# Patient Record
Sex: Male | Born: 1950 | Race: White | Hispanic: No | Smoking: Former smoker
Health system: Southern US, Community
[De-identification: ages and names within clinical notes are randomized; demographics above are authoritative.]

## PROBLEM LIST (undated history)

## (undated) ENCOUNTER — Inpatient Hospital Stay (HOSPITAL_COMMUNITY): Payer: Self-pay | Admitting: Podiatry

## (undated) DIAGNOSIS — I209 Angina pectoris, unspecified: Secondary | ICD-10-CM

## (undated) DIAGNOSIS — R05 Cough: Secondary | ICD-10-CM

## (undated) DIAGNOSIS — M25562 Pain in left knee: Secondary | ICD-10-CM

## (undated) DIAGNOSIS — E039 Hypothyroidism, unspecified: Secondary | ICD-10-CM

## (undated) DIAGNOSIS — R7989 Other specified abnormal findings of blood chemistry: Secondary | ICD-10-CM

## (undated) DIAGNOSIS — G8929 Other chronic pain: Secondary | ICD-10-CM

## (undated) DIAGNOSIS — M549 Dorsalgia, unspecified: Secondary | ICD-10-CM

## (undated) DIAGNOSIS — R9431 Abnormal electrocardiogram [ECG] [EKG]: Secondary | ICD-10-CM

## (undated) DIAGNOSIS — B029 Zoster without complications: Secondary | ICD-10-CM

## (undated) DIAGNOSIS — I10 Essential (primary) hypertension: Secondary | ICD-10-CM

## (undated) DIAGNOSIS — G2581 Restless legs syndrome: Secondary | ICD-10-CM

## (undated) DIAGNOSIS — R072 Precordial pain: Secondary | ICD-10-CM

## (undated) DIAGNOSIS — E875 Hyperkalemia: Secondary | ICD-10-CM

## (undated) DIAGNOSIS — M1990 Unspecified osteoarthritis, unspecified site: Secondary | ICD-10-CM

## (undated) DIAGNOSIS — J8489 Other specified interstitial pulmonary diseases: Secondary | ICD-10-CM

## (undated) DIAGNOSIS — M170 Bilateral primary osteoarthritis of knee: Secondary | ICD-10-CM

## (undated) DIAGNOSIS — Z7901 Long term (current) use of anticoagulants: Secondary | ICD-10-CM

## (undated) DIAGNOSIS — I219 Acute myocardial infarction, unspecified: Secondary | ICD-10-CM

## (undated) DIAGNOSIS — G629 Polyneuropathy, unspecified: Secondary | ICD-10-CM

## (undated) DIAGNOSIS — I5032 Chronic diastolic (congestive) heart failure: Secondary | ICD-10-CM

## (undated) DIAGNOSIS — Z8719 Personal history of other diseases of the digestive system: Secondary | ICD-10-CM

## (undated) DIAGNOSIS — Z0389 Encounter for observation for other suspected diseases and conditions ruled out: Secondary | ICD-10-CM

## (undated) DIAGNOSIS — I313 Pericardial effusion (noninflammatory): Secondary | ICD-10-CM

## (undated) DIAGNOSIS — I509 Heart failure, unspecified: Secondary | ICD-10-CM

## (undated) DIAGNOSIS — M797 Fibromyalgia: Secondary | ICD-10-CM

## (undated) DIAGNOSIS — Z794 Long term (current) use of insulin: Secondary | ICD-10-CM

## (undated) DIAGNOSIS — I110 Hypertensive heart disease with heart failure: Secondary | ICD-10-CM

## (undated) DIAGNOSIS — J449 Chronic obstructive pulmonary disease, unspecified: Secondary | ICD-10-CM

## (undated) DIAGNOSIS — I2510 Atherosclerotic heart disease of native coronary artery without angina pectoris: Secondary | ICD-10-CM

## (undated) DIAGNOSIS — Z8673 Personal history of transient ischemic attack (TIA), and cerebral infarction without residual deficits: Secondary | ICD-10-CM

## (undated) DIAGNOSIS — I499 Cardiac arrhythmia, unspecified: Secondary | ICD-10-CM

## (undated) DIAGNOSIS — I3139 Other pericardial effusion (noninflammatory): Secondary | ICD-10-CM

## (undated) DIAGNOSIS — I4891 Unspecified atrial fibrillation: Secondary | ICD-10-CM

## (undated) DIAGNOSIS — J189 Pneumonia, unspecified organism: Secondary | ICD-10-CM

## (undated) DIAGNOSIS — I739 Peripheral vascular disease, unspecified: Secondary | ICD-10-CM

## (undated) DIAGNOSIS — E1165 Type 2 diabetes mellitus with hyperglycemia: Secondary | ICD-10-CM

## (undated) DIAGNOSIS — I482 Chronic atrial fibrillation, unspecified: Secondary | ICD-10-CM

## (undated) DIAGNOSIS — E1142 Type 2 diabetes mellitus with diabetic polyneuropathy: Secondary | ICD-10-CM

## (undated) DIAGNOSIS — R188 Other ascites: Secondary | ICD-10-CM

## (undated) DIAGNOSIS — M25561 Pain in right knee: Secondary | ICD-10-CM

## (undated) DIAGNOSIS — J9610 Chronic respiratory failure, unspecified whether with hypoxia or hypercapnia: Secondary | ICD-10-CM

## (undated) DIAGNOSIS — R059 Cough, unspecified: Secondary | ICD-10-CM

## (undated) DIAGNOSIS — G4733 Obstructive sleep apnea (adult) (pediatric): Secondary | ICD-10-CM

## (undated) DIAGNOSIS — J45909 Unspecified asthma, uncomplicated: Secondary | ICD-10-CM

## (undated) DIAGNOSIS — E11621 Type 2 diabetes mellitus with foot ulcer: Secondary | ICD-10-CM

## (undated) DIAGNOSIS — G4730 Sleep apnea, unspecified: Secondary | ICD-10-CM

## (undated) DIAGNOSIS — I639 Cerebral infarction, unspecified: Secondary | ICD-10-CM

## (undated) DIAGNOSIS — K219 Gastro-esophageal reflux disease without esophagitis: Secondary | ICD-10-CM

## (undated) DIAGNOSIS — J42 Unspecified chronic bronchitis: Secondary | ICD-10-CM

## (undated) DIAGNOSIS — E119 Type 2 diabetes mellitus without complications: Secondary | ICD-10-CM

## (undated) DIAGNOSIS — L97514 Non-pressure chronic ulcer of other part of right foot with necrosis of bone: Secondary | ICD-10-CM

## (undated) DIAGNOSIS — R042 Hemoptysis: Secondary | ICD-10-CM

## (undated) DIAGNOSIS — Z9884 Bariatric surgery status: Secondary | ICD-10-CM

## (undated) DIAGNOSIS — Z01810 Encounter for preprocedural cardiovascular examination: Secondary | ICD-10-CM

## (undated) DIAGNOSIS — E669 Obesity, unspecified: Secondary | ICD-10-CM

## (undated) DIAGNOSIS — Z9989 Dependence on other enabling machines and devices: Secondary | ICD-10-CM

## (undated) DIAGNOSIS — E785 Hyperlipidemia, unspecified: Secondary | ICD-10-CM

## (undated) DIAGNOSIS — Z9981 Dependence on supplemental oxygen: Secondary | ICD-10-CM

## (undated) DIAGNOSIS — N289 Disorder of kidney and ureter, unspecified: Secondary | ICD-10-CM

## (undated) DIAGNOSIS — G709 Myoneural disorder, unspecified: Secondary | ICD-10-CM

## (undated) HISTORY — DX: Dorsalgia, unspecified: M54.9

## (undated) HISTORY — DX: Pain in right knee: M25.561

## (undated) HISTORY — DX: Chronic atrial fibrillation, unspecified: I48.20

## (undated) HISTORY — PX: UMBILICAL HERNIA REPAIR: SHX196

## (undated) HISTORY — DX: Encounter for preprocedural cardiovascular examination: Z01.810

## (undated) HISTORY — DX: Long term (current) use of insulin: Z79.4

## (undated) HISTORY — PX: HERNIA REPAIR: SHX51

## (undated) HISTORY — DX: Bariatric surgery status: Z98.84

## (undated) HISTORY — DX: Other chronic pain: G89.29

## (undated) HISTORY — DX: Hyperkalemia: E87.5

## (undated) HISTORY — DX: Precordial pain: R07.2

## (undated) HISTORY — DX: Chronic diastolic (congestive) heart failure: I50.32

## (undated) HISTORY — DX: Bilateral primary osteoarthritis of knee: M17.0

## (undated) HISTORY — DX: Hemoptysis: R04.2

## (undated) HISTORY — DX: Hyperlipidemia, unspecified: E78.5

## (undated) HISTORY — DX: Chronic obstructive pulmonary disease, unspecified: J44.9

## (undated) HISTORY — DX: Type 2 diabetes mellitus with foot ulcer: L97.514

## (undated) HISTORY — DX: Zoster without complications: B02.9

## (undated) HISTORY — DX: Disorder of kidney and ureter, unspecified: N28.9

## (undated) HISTORY — DX: Type 2 diabetes mellitus with foot ulcer: E11.621

## (undated) HISTORY — DX: Atherosclerotic heart disease of native coronary artery without angina pectoris: I25.10

## (undated) HISTORY — DX: Cerebral infarction, unspecified: I63.9

## (undated) HISTORY — DX: Type 2 diabetes mellitus with diabetic polyneuropathy: E11.42

## (undated) HISTORY — DX: Pain in left knee: M25.562

## (undated) HISTORY — DX: Abnormal electrocardiogram (ECG) (EKG): R94.31

## (undated) HISTORY — PX: CHOLECYSTECTOMY: SHX55

## (undated) HISTORY — DX: Other ascites: R18.8

## (undated) HISTORY — DX: Chronic respiratory failure, unspecified whether with hypoxia or hypercapnia: J96.10

## (undated) HISTORY — PX: CATARACT EXTRACTION W/ INTRAOCULAR LENS  IMPLANT, BILATERAL: SHX1307

## (undated) HISTORY — DX: Hypertensive heart disease with heart failure: I11.0

## (undated) HISTORY — PX: SINUS EXPLORATION: SHX5214

## (undated) HISTORY — DX: Long term (current) use of anticoagulants: Z79.01

## (undated) HISTORY — DX: Encounter for observation for other suspected diseases and conditions ruled out: Z03.89

## (undated) HISTORY — DX: Pneumonia, unspecified organism: J18.9

## (undated) HISTORY — PX: APPENDECTOMY: SHX54

## (undated) HISTORY — DX: Other specified abnormal findings of blood chemistry: R79.89

## (undated) HISTORY — DX: Other specified interstitial pulmonary diseases: J84.89

## (undated) HISTORY — DX: Obesity, unspecified: E66.9

## (undated) HISTORY — DX: Personal history of transient ischemic attack (TIA), and cerebral infarction without residual deficits: Z86.73

## (undated) HISTORY — PX: CARDIAC CATHETERIZATION: SHX172

## (undated) HISTORY — DX: Unspecified chronic bronchitis: J42

## (undated) HISTORY — DX: Polyneuropathy, unspecified: G62.9

## (undated) HISTORY — DX: Type 2 diabetes mellitus with hyperglycemia: E11.65

---

## 2001-05-16 ENCOUNTER — Ambulatory Visit (HOSPITAL_BASED_OUTPATIENT_CLINIC_OR_DEPARTMENT_OTHER): Admission: RE | Admit: 2001-05-16 | Discharge: 2001-05-16 | Payer: Self-pay | Admitting: Family Medicine

## 2007-05-10 ENCOUNTER — Ambulatory Visit: Payer: Self-pay

## 2007-07-27 ENCOUNTER — Ambulatory Visit: Payer: Self-pay | Admitting: Internal Medicine

## 2007-12-31 ENCOUNTER — Ambulatory Visit: Payer: Self-pay | Admitting: Internal Medicine

## 2008-09-01 HISTORY — PX: LAPAROSCOPIC GASTRIC BANDING: SHX1100

## 2008-10-26 ENCOUNTER — Ambulatory Visit (HOSPITAL_COMMUNITY): Admission: RE | Admit: 2008-10-26 | Discharge: 2008-10-26 | Payer: Self-pay | Admitting: General Surgery

## 2008-11-16 ENCOUNTER — Encounter: Admission: RE | Admit: 2008-11-16 | Discharge: 2008-11-16 | Payer: Self-pay | Admitting: General Surgery

## 2009-05-31 ENCOUNTER — Encounter: Admission: RE | Admit: 2009-05-31 | Discharge: 2009-08-29 | Payer: Self-pay | Admitting: General Surgery

## 2009-06-11 ENCOUNTER — Other Ambulatory Visit: Payer: Self-pay | Admitting: General Surgery

## 2009-06-19 ENCOUNTER — Other Ambulatory Visit: Payer: Self-pay | Admitting: General Surgery

## 2009-06-19 ENCOUNTER — Inpatient Hospital Stay (HOSPITAL_COMMUNITY): Admission: RE | Admit: 2009-06-19 | Discharge: 2009-06-20 | Payer: Self-pay | Admitting: General Surgery

## 2009-09-25 ENCOUNTER — Ambulatory Visit (HOSPITAL_COMMUNITY): Admission: RE | Admit: 2009-09-25 | Discharge: 2009-09-25 | Payer: Self-pay | Admitting: Nephrology

## 2009-10-03 ENCOUNTER — Encounter: Admission: RE | Admit: 2009-10-03 | Discharge: 2009-10-03 | Payer: Self-pay | Admitting: General Surgery

## 2010-01-01 ENCOUNTER — Encounter: Admission: RE | Admit: 2010-01-01 | Discharge: 2010-01-01 | Payer: Self-pay | Admitting: General Surgery

## 2010-06-06 ENCOUNTER — Encounter
Admission: RE | Admit: 2010-06-06 | Discharge: 2010-06-06 | Payer: Self-pay | Source: Home / Self Care | Attending: General Surgery | Admitting: General Surgery

## 2010-09-01 DIAGNOSIS — I639 Cerebral infarction, unspecified: Secondary | ICD-10-CM | POA: Insufficient documentation

## 2010-09-01 HISTORY — DX: Cerebral infarction, unspecified: I63.9

## 2010-10-24 ENCOUNTER — Other Ambulatory Visit: Payer: Self-pay | Admitting: General Surgery

## 2010-10-28 ENCOUNTER — Ambulatory Visit
Admission: RE | Admit: 2010-10-28 | Discharge: 2010-10-28 | Disposition: A | Discharge status: Home / Self Care | Payer: PRIVATE HEALTH INSURANCE | Source: Ambulatory Visit | Attending: General Surgery | Admitting: General Surgery

## 2010-12-05 LAB — GLUCOSE, CAPILLARY
Glucose-Capillary: 128 mg/dL — ABNORMAL HIGH (ref 70–99)
Glucose-Capillary: 130 mg/dL — ABNORMAL HIGH (ref 70–99)
Glucose-Capillary: 136 mg/dL — ABNORMAL HIGH (ref 70–99)
Glucose-Capillary: 170 mg/dL — ABNORMAL HIGH (ref 70–99)
Glucose-Capillary: 176 mg/dL — ABNORMAL HIGH (ref 70–99)
Glucose-Capillary: 179 mg/dL — ABNORMAL HIGH (ref 70–99)
Glucose-Capillary: 187 mg/dL — ABNORMAL HIGH (ref 70–99)

## 2010-12-05 LAB — CBC
HCT: 37.7 % — ABNORMAL LOW (ref 39.0–52.0)
Hemoglobin: 12.6 g/dL — ABNORMAL LOW (ref 13.0–17.0)
MCV: 83.8 fL (ref 78.0–100.0)
MCV: 82.4 fL (ref 78.0–100.0)
RBC: 4.3 MIL/uL (ref 4.22–5.81)
RBC: 4.58 MIL/uL (ref 4.22–5.81)
RDW: 16.4 % — ABNORMAL HIGH (ref 11.5–15.5)
WBC: 7.6 10*3/uL (ref 4.0–10.5)

## 2010-12-05 LAB — COMPREHENSIVE METABOLIC PANEL
Alkaline Phosphatase: 77 U/L (ref 39–117)
CO2: 33 mEq/L — ABNORMAL HIGH (ref 19–32)
Calcium: 9.3 mg/dL (ref 8.4–10.5)
Chloride: 95 mEq/L — ABNORMAL LOW (ref 96–112)
Creatinine, Ser: 1.49 mg/dL (ref 0.4–1.5)
GFR calc Af Amer: 58 mL/min — ABNORMAL LOW (ref >60)
GFR calc non Af Amer: 48 mL/min — ABNORMAL LOW (ref >60)
Potassium: 3.4 mEq/L — ABNORMAL LOW (ref 3.5–5.1)

## 2010-12-05 LAB — HEMOGLOBIN AND HEMATOCRIT, BLOOD
HCT: 34.4 % — ABNORMAL LOW (ref 39.0–52.0)
Hemoglobin: 11.3 g/dL — ABNORMAL LOW (ref 13.0–17.0)

## 2010-12-05 LAB — DIFFERENTIAL
Basophils Absolute: 0 10*3/uL (ref 0.0–0.1)
Basophils Absolute: 0 10*3/uL (ref 0.0–0.1)
Basophils Relative: 0 % (ref 0–1)
Basophils Relative: 0 % (ref 0–1)
Eosinophils Absolute: 0 10*3/uL (ref 0.0–0.7)
Lymphocytes Relative: 18 % (ref 12–46)
Lymphocytes Relative: 21 % (ref 12–46)
Lymphs Abs: 1.2 10*3/uL (ref 0.7–4.0)
Lymphs Abs: 1.6 10*3/uL (ref 0.7–4.0)
Monocytes Absolute: 0.5 10*3/uL (ref 0.1–1.0)
Neutro Abs: 4.9 10*3/uL (ref 1.7–7.7)
Neutrophils Relative %: 74 % (ref 43–77)

## 2011-01-10 ENCOUNTER — Institutional Professional Consult (permissible substitution): Payer: PRIVATE HEALTH INSURANCE | Admitting: Pulmonary Disease

## 2011-01-14 NOTE — Assessment & Plan Note (Signed)
[facility]                            CARDIOLOGY OFFICE NOTE   NAME:Jon [name], Jon [name]                      MRN:          [id number]  DATE:12/31/2007                            DOB:          1951/08/30    PRIMARY CARE PHYSICIAN:  Dr. Shayne Alken at [facility]  in [place].   INTERVAL HISTORY:  Jon [name] is a 60 year old male with multiple  medical problems including hypertension, morbid obesity, hyperlipidemia,  diabetes, COPD, obstructive sleep apnea on CPAP, paroxysmal atrial  fibrillation presents today for follow-up.   He has had an extensive cardiac workup in the past including several  catheterizations most recently in August 2007 which showed a normal LV  function and normal coronary arteries.   He continues to have episodes of chest pain which he rate 6-7/10.  He  has been seen multiple times in the [place] ER and told these are  likely due to indigestion or his blood pressure.  He is getting quite  frustrated with this.  He has been doing his best to try and lose weight  he says.  He has been trying to watch what he eats and walking twice a  day for half mile to three-quarters of a mile.  Says he can do this, but  he also has to stop to rest due to shortness of breath.  He has not been  successful losing much weight.   CURRENT MEDICATIONS:  1. Lyrica 300 b.i.d.  2. Lipitor 20 a day.  3. Requip.  4. Advair 500/50 b.i.d.  5. Astelin nasal spray.  6. Nasacort.  7. Torsemide 20 t.i.d.  8. Coumadin.  9. Diltiazem 120 a day.  10.Potassium 20 t.i.d.  11.Insulin.  12.Pantoprazole 40 a day.   PHYSICAL EXAMINATION:  GENERAL:  He is in no acute distress.  He  ambulates around the clinic without respiratory difficulty.  Blood  pressure is 124/50, heart rate 80, weight is 280 which is up 4 pounds  from previous.  HEENT:  Normal.  NECK:  Supple and thick, unable to see his JVD.  Carotids have 2+  bilateral bruits.  There is  no lymphadenopathy or thyromegaly.  CARDIAC:  PMI is nonpalpable, distant heart sounds.  He is regular with  no murmurs.  LUNGS:  Clear with no wheezing.  ABDOMEN:  Marked central obesity.  Unable to appreciate any  hepatosplenomegaly.  There are no bruits, no appreciable masses.  Good  bowel sounds.  EXTREMITIES:  Warm with no cyanosis, clubbing or edema.  No rash.  NEUROLOGICAL:  Alert and oriented x3.  Cranial nerves II-XII are intact.  Moves all fours without difficulty.  Affect is pleasant.   STUDIES:  EKG shows normal sinus rhythm with a chronic left bundle  branch block at a rate of 80.   ASSESSMENT/PLAN:  1. Chest pain.  Given his normal catheterization just less than 2      years ago, I think his chest pain is probably not cardiac and more      related to his obesity and related dyspnea.  2. Obesity.  I think this is the central part of his problem.  He has      been unsuccessful with diet and any attempts to lose weight.  I      will refer him to Dr. Kaylyn Lim [facility] for      consideration of possible bariatric surgery.  3. Hypertension, this is well-controlled.  4. Hyperlipidemia.  This is followed by Dr. Tobie Poet.  Given his diabetes,      goal LDL is less than 70.   DISPOSITION:  Return to clinic in one year for follow-up.     Shaune Pascal. Bensimhon, MD  Electronically Signed    DRB/MedQ  DD: 12/31/2007  DT: 12/31/2007  Job #: [id number]   cc:   Isabel Caprice Hassell Done, MD

## 2011-01-14 NOTE — Assessment & Plan Note (Signed)
[facility]                            CARDIOLOGY OFFICE NOTE   NAME:[name], Jon [name]                      MRN:          [id number]  DATE:07/27/2007                            DOB:          [birth year]    REFERRING PHYSICIAN:  Dr. Chalmers Cater, [facility]   REASON FOR CONSULTATION:  Chest pain and shortness of breath.   HISTORY OF PRESENT ILLNESS:  Jon [name] is a 60 year old male with  multiple medical problems including obesity, hypertension,  hyperlipidemia, diabetes, COPD, obstructive sleep apnea on C-PAP, and  paroxysmal atrial fibrillation, who presents today for further  evaluation of chest pain.   He tells me that his chest pain actually dates more than 15 years.  He  had a cardiac catheterization by Dr. Ellouise Newer in 1994 which showed  normal coronary arteries and normal LV function.  More recently, he has  been seen by Dr. Jenne Campus at [facility].  They have  done a very thorough workup including an echocardiogram showing an  ejection fraction of 50-55% with significant diastolic dysfunction and  mild mitral and tricuspid regurgitation.  He also has a cardiac  catheterization which showed normal coronary arteries and an LVEDP of  18.  CT scan of the chest revealed no evidence of pulmonary embolus.   He presents asking for a second opinion.  He felt that Dr. Agustin Cree was  inappropriate as he often would give him metaphors about his heart  disease like talking about a car and he was uncomfortable with this.   He says that he has been told to exercise and diet.  He has dieted in  the past and lost 40-50 pounds but he has put these back on.  He says  his wife continues to feed him aggressively and he has a hard time  saying no.  He has tried to do some exercise but this is limited by his  neuropathy and leg pain.  He denies any orthopnea, no PND.  He does  sleep with C-PAP.  No palpitations or syncope.   REVIEW OF  SYSTEMS:  Notable for gastroesophageal reflux disease,  diabetes, neuropathy, constipation, shortness of breath, allergies and  peptic ulcer disease.  The remainder of the review of systems is  negative except for HPI and problem list.   PROBLEM LIST:  1. Chronic chest pain and dyspnea.      a.     Cardiac catheterizations x2 with normal coronary arteries,       most recently in August 2007.      b.     Echocardiogram, ejection fraction [id number] with diastolic       dysfunction.      c.     A CT scan of the chest negative for pulmonary embolus.  2. Paroxysmal atrial fibrillation.  3. Morbid obesity.  4. Hypertension.  5. Hyperlipidemia.  6. COPD with previous cigar smoke.  7. Obstructive sleep apnea on C-PAP.  8. Peripheral neuropathy.   CURRENT MEDICATIONS:  1. Insulin  2. Lyrica 300 b.i.d.  3. Lipitor 20 a day.  4.  Omeprazole 20 a day.  5. Requip  6. Mobic 15 a day.  7. Advair 500/50 b.i.d.  8. Astelin nasal spray.  9. Nasacort  10.Norvasc 5 a day.  11.Torsemide 20 b.i.d.  12.Benicar HCTZ 40/12.5  13.Coumadin  14.Diltiazem 120 mg a day.  15.Potassium 20 mEq t.i.d.   ALLERGIES:  PENICILLIN and CODEINE.   SOCIAL HISTORY:  He is married with one child.  He is here with his  daughter today.  He is retired.  He has a history of heavy cigar smoking  but quit in 2007.  Also has a history of heavy alcohol use but now does  not drink anymore.   FAMILY HISTORY:  Mother died at 36 due to breast cancer.  Father died at  55 due to stroke.  He has 2 sisters.  There is no family history of  premature coronary artery disease.   PHYSICAL EXAMINATION:  He is a morbidly obese male, ambulates around the  clinic slowly but no respiratory difficulty.  Blood pressure is 118/58,  heart rate 74, weight is 276.  HEENT:  Normal.  NECK:  Thick, supple, no obvious JVD.  Carotids are 2+ bilaterally with  no bruits.  There is no lymphadenopathy or thyromegaly.  CARDIAC:  PMI is nondisplaced.   He has distant heart sounds.  He is  regular, no obvious murmurs, rubs or gallops.  LUNGS:  Clear, with decreased air movement throughout, no wheezing.  ABDOMEN:  Morbidly obese, good bowel sounds and soft.  Unable to  evaluate for hepatosplenomegaly, no bruits, no masses appreciated.  EXTREMITIES:  Warm with no cyanosis, clubbing.  There is trace edema  bilaterally.  Distal pulses are 1+ bilaterally.  NEURO:  Alert and oriented x3.  Cranial nerves II-XII are intact.  Moves  all 4 extremities without difficulty.  Affect is pleasant.   EKG shows normal sinus rhythm with a nonspecific intraventricular  conduction delay.  Rate is 67.  There is also a first degree AV block at  206 Msec.   ASSESSMENT AND PLAN:  1. Chest pain and shortness of breath.  These are both chronic.  I had      a long talk with him and his daughter and told him that he has had      a very thorough workup and I do not think that his chest pain is      ischemic in nature.  I think that almost all of his symptoms can be      attributed to his marked obesity and deconditioning, as well as his      COPD.  We did discuss the possibility of a cardiopulmonary exercise      test.  I told him the most important thing is to get serious about      his diet and exercise plan and do his best to be somewhat more      active and watch his diet very closely.  I set a goal for him to      lose 1/2 pound to 1 pound a week.  I will see him back in 4 months      and told him I expect him to be 15 pounds lighter.  I would not      recommend any further testing at this point.   Of note, would mention that he is on 2 calcium channel blockers,  Diltiazem and Norvasc.  Given his history of paroxysmal atrial  fibrillation, he may do best  with just switching his Norvasc over to  increasing his Diltiazem dose.  I will leave this to Dr. Tobie Poet.   DISPOSITION:  I will see him back in several months for a routine  followup.     Shaune Pascal.  Bensimhon, MD  Electronically Signed    DRB/MedQ  DD: 07/27/2007  DT: 07/27/2007  Job #: [id number]   cc:   Patricia Pesa, fax: [phone] Dr. Chalmers Cater, [facility]

## 2011-12-25 ENCOUNTER — Other Ambulatory Visit: Payer: Self-pay | Admitting: Orthopedic Surgery

## 2011-12-26 ENCOUNTER — Encounter (HOSPITAL_COMMUNITY)
Admission: RE | Admit: 2011-12-26 | Discharge: 2011-12-26 | Disposition: A | Discharge status: Home / Self Care | Payer: Medicare Other | Source: Ambulatory Visit | Attending: Orthopedic Surgery | Admitting: Orthopedic Surgery

## 2011-12-26 ENCOUNTER — Encounter (HOSPITAL_COMMUNITY): Payer: Self-pay | Admitting: Vascular Surgery

## 2011-12-26 ENCOUNTER — Encounter (HOSPITAL_COMMUNITY): Payer: Self-pay

## 2011-12-26 DIAGNOSIS — Z01818 Encounter for other preprocedural examination: Secondary | ICD-10-CM | POA: Insufficient documentation

## 2011-12-26 HISTORY — DX: Myoneural disorder, unspecified: G70.9

## 2011-12-26 HISTORY — DX: Essential (primary) hypertension: I10

## 2011-12-26 HISTORY — DX: Hypothyroidism, unspecified: E03.9

## 2011-12-26 HISTORY — DX: Hyperlipidemia, unspecified: E78.5

## 2011-12-26 HISTORY — DX: Cardiac arrhythmia, unspecified: I49.9

## 2011-12-26 HISTORY — DX: Angina pectoris, unspecified: I20.9

## 2011-12-26 HISTORY — DX: Cerebral infarction, unspecified: I63.9

## 2011-12-26 HISTORY — DX: Unspecified osteoarthritis, unspecified site: M19.90

## 2011-12-26 HISTORY — DX: Pneumonia, unspecified organism: J18.9

## 2011-12-26 HISTORY — DX: Sleep apnea, unspecified: G47.30

## 2011-12-26 HISTORY — DX: Restless legs syndrome: G25.81

## 2011-12-26 HISTORY — DX: Gastro-esophageal reflux disease without esophagitis: K21.9

## 2011-12-26 HISTORY — DX: Personal history of other diseases of the digestive system: Z87.19

## 2011-12-26 LAB — URINE MICROSCOPIC-ADD ON

## 2011-12-26 LAB — CBC
Hemoglobin: 14.9 g/dL (ref 13.0–17.0)
MCH: 30.8 pg (ref 26.0–34.0)
MCV: 85.1 fL (ref 78.0–100.0)
RBC: 4.84 MIL/uL (ref 4.22–5.81)
WBC: 13.4 10*3/uL — ABNORMAL HIGH (ref 4.0–10.5)

## 2011-12-26 LAB — DIFFERENTIAL
Basophils Absolute: 0 10*3/uL (ref 0.0–0.1)
Basophils Relative: 0 % (ref 0–1)
Eosinophils Relative: 0 % (ref 0–5)
Lymphocytes Relative: 10 % — ABNORMAL LOW (ref 12–46)
Monocytes Absolute: 0.7 10*3/uL (ref 0.1–1.0)

## 2011-12-26 LAB — COMPREHENSIVE METABOLIC PANEL
ALT: 15 U/L (ref 0–53)
AST: 12 U/L (ref 0–37)
CO2: 24 mEq/L (ref 19–32)
Calcium: 10 mg/dL (ref 8.4–10.5)
Chloride: 99 mEq/L (ref 96–112)
Creatinine, Ser: 0.89 mg/dL (ref 0.50–1.35)
GFR calc Af Amer: >90 mL/min (ref >90)
GFR calc non Af Amer: 89 mL/min — ABNORMAL LOW (ref >90)
Glucose, Bld: 241 mg/dL — ABNORMAL HIGH (ref 70–99)
Total Bilirubin: 1.1 mg/dL (ref 0.3–1.2)

## 2011-12-26 LAB — URINALYSIS, ROUTINE W REFLEX MICROSCOPIC
Bilirubin Urine: NEGATIVE
Glucose, UA: >1000 mg/dL — AB
Hgb urine dipstick: NEGATIVE
Ketones, ur: NEGATIVE mg/dL
Protein, ur: NEGATIVE mg/dL
Urobilinogen, UA: 0.2 mg/dL (ref 0.0–1.0)

## 2011-12-26 LAB — TYPE AND SCREEN
ABO/RH(D): A POS
Antibody Screen: NEGATIVE

## 2011-12-26 LAB — ABO/RH: ABO/RH(D): A POS

## 2011-12-26 LAB — PROTIME-INR: Prothrombin Time: 17.1 seconds — ABNORMAL HIGH (ref 11.6–15.2)

## 2011-12-26 NOTE — Progress Notes (Addendum)
Medical physican - Dr. Dirk Dress - [place]   Patient has insulin pump on right side  Patient does not have a cardiologist.    Patient had cardiac cath, stress test, echo in 2007 - records requested  Patient had chest xray April 1st at [facility] - records requested

## 2011-12-26 NOTE — Consult Note (Signed)
Anesthesia Chart Review:  Patient is a 61 year old male scheduled for a right TKA on 01/05/12.  History includes DM with an insulin pump, peripheral neuropathy, HLD, GERD, CVA, HTN, hypothyroidism, OSA, RLS, HH, arthritis.  PCP is Dr. Dirk Dress in [place].  He has previously been seen by a Cardiologist at [facility], but is not being followed currently.    EKG today shows SB with a left BBB.  It has not significantly changed since 10/26/08.    He reports a previous echo, stress, and cath.  Records have been requested, but are still pending.  His CXR from [facility] done on 12/16/11 showed no active cardiopulmonary abnormalities.  Labs noted.  Glucose is 241.  Cr 0.89.  WBC 13.4.  H/H 14.9/41.2.  PT is slightly elevated at 17.1.  INR and PTT are WNL.  Urine is negative for leukocytes and nitrites. Positive glucose.  Urine culture is pending.  I'll follow-up once I receive his Cardiac records/tests.  Keri at Dr. Ruel Favors office is going to fax over his medical clearance as well.

## 2011-12-26 NOTE — Pre-Procedure Instructions (Addendum)
Jon [name]  12/26/2011   Your procedure is scheduled on:  May 6,2013 @ 1000  Report to [facility] at 0800 AM.  Call this number if you have problems the morning of surgery: [phone]   Remember:   Do not eat food:After Midnight.  May have clear liquids: up to 4 Hours before arrival.  Clear liquids include soda, tea, black coffee, apple or grape juice, broth.  Take these medicines the morning of surgery with A SIP OF WATER: Cardizem, duoneb, advair, protonix, lyrica, nasal spray   Do not wear jewelry  Do not wear lotions, powders, or perfumes.   Do not bring valuables to the hospital.  Contacts, dentures or bridgework may not be worn into surgery.  Leave suitcase in the car. After surgery it may be brought to your room.  For patients admitted to the hospital, checkout time is 11:00 AM the day of discharge.   Patients discharged the day of surgery will not be allowed to drive home.  Special Instructions: CHG Shower Use Special Wash: 1/2 bottle night before surgery and 1/2 bottle morning of surgery.   Please read over the following fact sheets that you were given: Pain Booklet, Coughing and Deep Breathing, Blood Transfusion Information, MRSA Information and Surgical Site Infection Prevention

## 2011-12-27 LAB — URINE CULTURE
Colony Count: NO GROWTH
Culture: NO GROWTH

## 2012-01-05 ENCOUNTER — Inpatient Hospital Stay (HOSPITAL_COMMUNITY): Admission: RE | Admit: 2012-01-05 | Payer: Medicare Other | Source: Ambulatory Visit | Admitting: Orthopedic Surgery

## 2012-01-05 ENCOUNTER — Encounter (HOSPITAL_COMMUNITY): Admission: RE | Payer: Self-pay | Source: Ambulatory Visit

## 2012-01-05 SURGERY — ARTHROPLASTY, KNEE, TOTAL
Anesthesia: Regional | Laterality: Right

## 2012-01-08 ENCOUNTER — Encounter (INDEPENDENT_AMBULATORY_CARE_PROVIDER_SITE_OTHER): Payer: Self-pay | Admitting: General Surgery

## 2013-10-17 DIAGNOSIS — M25519 Pain in unspecified shoulder: Secondary | ICD-10-CM | POA: Diagnosis not present

## 2013-10-17 DIAGNOSIS — E1149 Type 2 diabetes mellitus with other diabetic neurological complication: Secondary | ICD-10-CM | POA: Diagnosis not present

## 2013-10-17 DIAGNOSIS — I4891 Unspecified atrial fibrillation: Secondary | ICD-10-CM | POA: Diagnosis not present

## 2013-10-17 DIAGNOSIS — E782 Mixed hyperlipidemia: Secondary | ICD-10-CM | POA: Diagnosis not present

## 2013-10-17 DIAGNOSIS — M1710 Unilateral primary osteoarthritis, unspecified knee: Secondary | ICD-10-CM | POA: Diagnosis not present

## 2013-10-17 DIAGNOSIS — G4733 Obstructive sleep apnea (adult) (pediatric): Secondary | ICD-10-CM | POA: Diagnosis not present

## 2013-10-17 DIAGNOSIS — Z125 Encounter for screening for malignant neoplasm of prostate: Secondary | ICD-10-CM | POA: Diagnosis not present

## 2013-11-03 ENCOUNTER — Ambulatory Visit (INDEPENDENT_AMBULATORY_CARE_PROVIDER_SITE_OTHER): Payer: Medicare Other | Admitting: Physician Assistant

## 2013-11-03 ENCOUNTER — Encounter (INDEPENDENT_AMBULATORY_CARE_PROVIDER_SITE_OTHER): Payer: Self-pay | Admitting: Physician Assistant

## 2013-11-03 VITALS — BP 138/82 | HR 84 | Temp 98.2°F | Resp 20 | Ht 67.0 in | Wt 266.0 lb

## 2013-11-03 DIAGNOSIS — Z9884 Bariatric surgery status: Secondary | ICD-10-CM | POA: Insufficient documentation

## 2013-11-03 DIAGNOSIS — Z4651 Encounter for fitting and adjustment of gastric lap band: Secondary | ICD-10-CM | POA: Diagnosis not present

## 2013-11-03 HISTORY — DX: Bariatric surgery status: Z98.84

## 2013-11-03 NOTE — Progress Notes (Signed)
  HISTORY: Jon [name] is a 63 y.o.male who received an AP-Large lap-band in October 2010 by Dr. Excell Seltzer. He comes in having last been seen in February 2012. He has lost 5 lbs since then, with a total of 24 lbs since surgery. He denies regurgitation or reflux symptoms. He has a slight increase in hunger and portions. His level of physical activity remains relatively low. He wants to get back on track with weight loss.  VITAL SIGNS: Filed Vitals:   11/03/13 1019  BP: 138/82  Pulse: 84  Temp: 98.2 F (36.8 C)  Resp: 20    PHYSICAL EXAM: Physical exam reveals a very well-appearing 63 y.o.male in no apparent distress Neurologic: Awake, alert, oriented Psych: Bright affect, conversant Respiratory: Breathing even and unlabored. No stridor or wheezing Abdomen: Soft, nontender, nondistended to palpation. Incisions well-healed. No incisional hernias. Port easily palpated. Extremities: Atraumatic, good range of motion.  ASSESMENT: 63 y.o.  male  s/p AP-Large lap-band.   PLAN: The patient's port was accessed with a 20G Huber needle without difficulty. Clear fluid was aspirated and 0.5 mL saline was added to the port to give a total predicted volume of 9 mL. The patient was able to swallow water without difficulty following the procedure and was instructed to take clear liquids for the next 24-48 hours and advance slowly as tolerated. I asked him to follow-up with Nutrition. We're happy to give him a referral as he prefers to see a dietician in [place]. We will have him return in six weeks or sooner if needed.

## 2013-11-03 NOTE — Patient Instructions (Signed)

## 2013-11-08 ENCOUNTER — Other Ambulatory Visit (INDEPENDENT_AMBULATORY_CARE_PROVIDER_SITE_OTHER): Payer: Self-pay

## 2013-11-09 ENCOUNTER — Telehealth (INDEPENDENT_AMBULATORY_CARE_PROVIDER_SITE_OTHER): Payer: Self-pay | Admitting: *Deleted

## 2013-11-09 NOTE — Telephone Encounter (Signed)
LMOM for pt to return my call.  I was calling to notify him of the appt for his to see Gates Rigg, Registered Dietician on 11/18/13 with an arrival time of 2:30pm.  He will be going to [facility] located at [address]. Their phone number is [phone] if pt needs to reschedule.

## 2013-11-14 ENCOUNTER — Encounter (INDEPENDENT_AMBULATORY_CARE_PROVIDER_SITE_OTHER): Payer: Self-pay | Admitting: *Deleted

## 2013-11-14 NOTE — Telephone Encounter (Signed)
Mailed letter out to patient with appt information below included.

## 2013-11-18 DIAGNOSIS — E1165 Type 2 diabetes mellitus with hyperglycemia: Secondary | ICD-10-CM | POA: Diagnosis not present

## 2013-11-18 DIAGNOSIS — E669 Obesity, unspecified: Secondary | ICD-10-CM | POA: Diagnosis not present

## 2013-11-18 DIAGNOSIS — IMO0002 Reserved for concepts with insufficient information to code with codable children: Secondary | ICD-10-CM | POA: Diagnosis not present

## 2013-11-22 DIAGNOSIS — Z1211 Encounter for screening for malignant neoplasm of colon: Secondary | ICD-10-CM | POA: Diagnosis not present

## 2013-11-22 DIAGNOSIS — D485 Neoplasm of uncertain behavior of skin: Secondary | ICD-10-CM | POA: Diagnosis not present

## 2013-11-22 DIAGNOSIS — E1149 Type 2 diabetes mellitus with other diabetic neurological complication: Secondary | ICD-10-CM | POA: Diagnosis not present

## 2013-11-22 DIAGNOSIS — Z6839 Body mass index (BMI) 39.0-39.9, adult: Secondary | ICD-10-CM | POA: Diagnosis not present

## 2013-11-22 DIAGNOSIS — Z23 Encounter for immunization: Secondary | ICD-10-CM | POA: Diagnosis not present

## 2013-11-22 DIAGNOSIS — IMO0002 Reserved for concepts with insufficient information to code with codable children: Secondary | ICD-10-CM | POA: Diagnosis not present

## 2013-11-22 DIAGNOSIS — M1710 Unilateral primary osteoarthritis, unspecified knee: Secondary | ICD-10-CM | POA: Diagnosis not present

## 2013-11-22 DIAGNOSIS — Z0000 Encounter for general adult medical examination without abnormal findings: Secondary | ICD-10-CM | POA: Diagnosis not present

## 2013-11-28 DIAGNOSIS — Z1211 Encounter for screening for malignant neoplasm of colon: Secondary | ICD-10-CM | POA: Diagnosis not present

## 2013-11-30 ENCOUNTER — Encounter (INDEPENDENT_AMBULATORY_CARE_PROVIDER_SITE_OTHER): Payer: Self-pay | Admitting: General Surgery

## 2013-11-30 DIAGNOSIS — J209 Acute bronchitis, unspecified: Secondary | ICD-10-CM | POA: Diagnosis not present

## 2013-11-30 DIAGNOSIS — E1149 Type 2 diabetes mellitus with other diabetic neurological complication: Secondary | ICD-10-CM | POA: Diagnosis not present

## 2013-11-30 DIAGNOSIS — R042 Hemoptysis: Secondary | ICD-10-CM | POA: Diagnosis not present

## 2013-12-01 DIAGNOSIS — J40 Bronchitis, not specified as acute or chronic: Secondary | ICD-10-CM | POA: Diagnosis not present

## 2013-12-01 DIAGNOSIS — R059 Cough, unspecified: Secondary | ICD-10-CM | POA: Diagnosis not present

## 2013-12-05 DIAGNOSIS — M1990 Unspecified osteoarthritis, unspecified site: Secondary | ICD-10-CM | POA: Diagnosis not present

## 2013-12-05 DIAGNOSIS — M1710 Unilateral primary osteoarthritis, unspecified knee: Secondary | ICD-10-CM | POA: Diagnosis not present

## 2013-12-12 DIAGNOSIS — M1710 Unilateral primary osteoarthritis, unspecified knee: Secondary | ICD-10-CM | POA: Diagnosis not present

## 2013-12-15 ENCOUNTER — Encounter (INDEPENDENT_AMBULATORY_CARE_PROVIDER_SITE_OTHER): Payer: Medicare Other

## 2013-12-19 DIAGNOSIS — M1710 Unilateral primary osteoarthritis, unspecified knee: Secondary | ICD-10-CM | POA: Diagnosis not present

## 2013-12-19 DIAGNOSIS — M1990 Unspecified osteoarthritis, unspecified site: Secondary | ICD-10-CM | POA: Diagnosis not present

## 2013-12-20 ENCOUNTER — Encounter: Payer: Self-pay | Admitting: Critical Care Medicine

## 2013-12-20 ENCOUNTER — Ambulatory Visit (INDEPENDENT_AMBULATORY_CARE_PROVIDER_SITE_OTHER): Payer: Medicare Other | Admitting: Critical Care Medicine

## 2013-12-20 VITALS — BP 134/70 | HR 70 | Temp 98.9°F | Ht 69.0 in | Wt 263.0 lb

## 2013-12-20 DIAGNOSIS — J441 Chronic obstructive pulmonary disease with (acute) exacerbation: Secondary | ICD-10-CM

## 2013-12-20 DIAGNOSIS — K219 Gastro-esophageal reflux disease without esophagitis: Secondary | ICD-10-CM

## 2013-12-20 DIAGNOSIS — E1149 Type 2 diabetes mellitus with other diabetic neurological complication: Secondary | ICD-10-CM

## 2013-12-20 DIAGNOSIS — E669 Obesity, unspecified: Secondary | ICD-10-CM

## 2013-12-20 DIAGNOSIS — R042 Hemoptysis: Secondary | ICD-10-CM | POA: Diagnosis not present

## 2013-12-20 DIAGNOSIS — G2581 Restless legs syndrome: Secondary | ICD-10-CM

## 2013-12-20 DIAGNOSIS — G4730 Sleep apnea, unspecified: Secondary | ICD-10-CM

## 2013-12-20 MED ORDER — LEVOFLOXACIN 500 MG PO TABS: 500.0000 mg | ORAL_TABLET | Freq: Every day | ORAL | Status: DC

## 2013-12-20 NOTE — Progress Notes (Signed)
Subjective:    Patient ID: Jon [name], male    DOB: 28-Dec-1950, 63 y.o.   MRN: [id number]  HPI Hemoptysis started end of 10/2013.  Pure blood.   Dx bronchitis.  Noted wheezing.  Pt is on xarelto.   Last time coughed blood was one week ago. Has never done this before.   Also: DOE, wheezes. Notes GERD daily. Also sinus pressure Needs R TKR.  Had prior sinus surgery 2x All pcn/codeine  Hx of CHF.  Atrial fib.  [place] is cardiology Hx lap band surgery [facility]  Dx: asthma, DM2 on pump, hyperlipidemia OSA on cpap plus oxygen 2L DME: [employer]  CBGs 140- 160  Cough is better.  Still wheezing. On advair  ex smoker 5 cigars per day for 39yrs  Past Medical History  Diagnosis Date  . Hypothyroidism   . Dysrhythmia     atrial fib takes cardizem,   . Hypertension   . Angina   . Stroke 2012  . Restless leg syndrome   . GERD (gastroesophageal reflux disease)   . Sleep apnea     cpap & oxygen  . Diabetes mellitus     insulin pump   . Pneumonia     hx of  . Hyperlipemia   . H/O hiatal hernia   . Neuromuscular disorder     rls, neuropathy  . Arthritis     right knee  . Obesity (BMI 30-39.9)      Family History  Problem Relation Age of Onset  . Anesthesia problems Neg Hx   . Hypotension Neg Hx   . Malignant hyperthermia Neg Hx   . Pseudochol deficiency Neg Hx   . Cancer Mother      History   Social History  . Marital Status: Married    Spouse Name: N/A    Number of Children: N/A  . Years of Education: N/A   Occupational History  . Not on file.   Social History Main Topics  . Smoking status: Former Smoker -- 4 years    Types: Cigars    Quit date: 09/01/2006  . Smokeless tobacco: Never Used  . Alcohol Use: No     Comment: quit 8 years ago  . Drug Use: Not on file  . Sexual Activity: Not on file   Other Topics Concern  . Not on file   Social History Narrative  . No narrative on file     Allergies  Allergen Reactions  . Codeine Itching  .  Penicillins Rash     Outpatient Prescriptions Prior to Visit  Medication Sig Dispense Refill  . atorvastatin (LIPITOR) 40 MG tablet Take 40 mg by mouth daily.      . Calcium Carbonate-Vitamin D (CALTRATE 600+D PO) Take 2 tablets by mouth 2 (two) times daily.       Marland Kitchen diltiazem (CARDIZEM) 30 MG tablet Take 30 mg by mouth 2 (two) times daily.      . fentaNYL (DURAGESIC - DOSED MCG/HR) 75 MCG/HR Place 1 patch onto the skin every 3 (three) days.      . Fluticasone-Salmeterol (ADVAIR) 500-50 MCG/DOSE AEPB Inhale 1 puff into the lungs every 12 (twelve) hours.      . Insulin Human (INSULIN PUMP) 100 unit/ml SOLN Inject into the skin continuous. novolog      . isosorbide mononitrate (IMDUR) 60 MG 24 hr tablet Take 60 mg by mouth every morning.      Marland Kitchen lisinopril (PRINIVIL,ZESTRIL) 10 MG tablet Take 10 mg by  mouth daily.      . Multiple Vitamins-Minerals (CENTRUM SILVER PO) Take 1 tablet by mouth daily.      Marland Kitchen oxyCODONE-acetaminophen (PERCOCET) 5-325 MG per tablet Take 1 tablet by mouth 4 (four) times daily as needed. For severe pain      . pantoprazole (PROTONIX) 40 MG tablet Take 40 mg by mouth 2 (two) times daily.      . potassium chloride SA (K-DUR,KLOR-CON) 20 MEQ tablet Take 20 mEq by mouth 3 (three) times daily.      . pregabalin (LYRICA) 300 MG capsule Take 300 mg by mouth 2 (two) times daily.      . rivaroxaban (XARELTO) 10 MG TABS tablet Take 20 mg by mouth daily.      Marland Kitchen zolpidem (AMBIEN) 10 MG tablet Take 10 mg by mouth at bedtime.      Marland Kitchen amitriptyline (ELAVIL) 25 MG tablet Take 25 mg by mouth at bedtime.      Marland Kitchen ipratropium-albuterol (DUONEB) 0.5-2.5 (3) MG/3ML SOLN Take 3 mLs by nebulization every 6 (six) hours as needed. For shortness of breath      . lubiprostone (AMITIZA) 24 MCG capsule Take 24 mcg by mouth daily with breakfast.      . magnesium oxide (MAG-OX) 400 MG tablet Take 1,600 mg by mouth daily.       . nitroGLYCERIN (NITROSTAT) 0.4 MG SL tablet Place 0.4 mg under the tongue every  5 (five) minutes as needed. For chest pain      . pramipexole (MIRAPEX) 1.5 MG tablet Take 1.5 mg by mouth at bedtime.      . torsemide (DEMADEX) 20 MG tablet Take 20 mg by mouth 2 (two) times daily.      Marland Kitchen triamcinolone (NASACORT) 55 MCG/ACT nasal inhaler Place 2 sprays into the nose daily.       No facility-administered medications prior to visit.        Review of Systems Constitutional:   No  weight loss, night sweats,  Fevers, chills, fatigue, lassitude. HEENT:   No headaches,  Difficulty swallowing,  Tooth/dental problems,  Sore throat,                No sneezing, itching, ear ache, nasal congestion, post nasal drip,   CV:  No chest pain,  Orthopnea, PND, swelling in lower extremities, anasarca, dizziness, palpitations  GI  No heartburn, indigestion, abdominal pain, nausea, vomiting, diarrhea, change in bowel habits, loss of appetite  Resp: No shortness of breath with exertion or at rest.  No excess mucus, no productive cough,  No non-productive cough,  +++coughing up of blood.  No change in color of mucus.  No wheezing.  No chest wall deformity  Skin: no rash or lesions.  GU: no dysuria, change in color of urine, no urgency or frequency.  No flank pain.  MS:  No joint pain or swelling.  No decreased range of motion.  No back pain.  Psych:  No change in mood or affect. No depression or anxiety.  No memory loss.     Objective:   Physical Exam Filed Vitals:   12/20/13 1348  BP: 134/70  Pulse: 70  Temp: 98.9 F (37.2 C)  TempSrc: Oral  Height: 5\' 9"  (1.753 m)  Weight: 263 lb (119.296 kg)  SpO2: 95%    Gen: Pleasant, obese, in no distress,  normal affect  ENT: No lesions,  mouth clear,  oropharynx clear, no postnasal drip  Neck: No JVD, no TMG, no carotid bruits  Lungs: No use of accessory muscles, no dullness to percussion, distant bs  Cardiovascular: RRR, heart sounds normal, no murmur or gallops, no peripheral edema  Abdomen: soft and NT, no HSM,  BS normal  insulin pump in place   Musculoskeletal: No deformities, no cyanosis or clubbing  Neuro: alert, non focal  Skin: Warm, no lesions or rashes  No results found. CXR: NAD        Assessment & Plan:   Hemoptysis Smoking hx and ongoing hemoptysis with prob associated tracheobronchitis. CXR does not reveal any mass.   Ongoing use of NOAC Xarelto plus ASA heigtens bleed risk Plan A bronchoscopy will be obtained  Take levaquin 500mg  daily for 7days Stay on advair No other changes in medications  Obstructive chronic bronchitis with exacerbation Hemoptysis with asthmatic bronchitis Plan FOB Levaquin x 7days Cont advair    Updated Medication List Outpatient Encounter Prescriptions as of 12/20/2013  Medication Sig  . albuterol (PROVENTIL HFA;VENTOLIN HFA) 108 (90 BASE) MCG/ACT inhaler Inhale 2 puffs into the lungs every 6 (six) hours as needed for wheezing or shortness of breath.  Marland Kitchen atorvastatin (LIPITOR) 40 MG tablet Take 40 mg by mouth daily.  Marland Kitchen azelastine (ASTELIN) 137 MCG/SPRAY nasal spray Place 1 spray into both nostrils at bedtime. Use in each nostril as directed  . Calcium Carbonate-Vitamin D (CALTRATE 600+D PO) Take 2 tablets by mouth 2 (two) times daily.   Marland Kitchen diltiazem (CARDIZEM) 30 MG tablet Take 30 mg by mouth 2 (two) times daily.  . fentaNYL (DURAGESIC - DOSED MCG/HR) 75 MCG/HR Place 1 patch onto the skin every 3 (three) days.  . fluticasone (FLONASE) 50 MCG/ACT nasal spray Place 1 spray into both nostrils daily.  . Fluticasone-Salmeterol (ADVAIR) 500-50 MCG/DOSE AEPB Inhale 1 puff into the lungs every 12 (twelve) hours.  . hydrALAZINE (APRESOLINE) 50 MG tablet Take 50 mg by mouth 4 (four) times daily.  . Insulin Human (INSULIN PUMP) 100 unit/ml SOLN Inject into the skin continuous. novolog  . isosorbide mononitrate (IMDUR) 60 MG 24 hr tablet Take 60 mg by mouth every morning.  . latanoprost (XALATAN) 0.005 % ophthalmic solution Place 1 drop into both eyes at bedtime.  Marland Kitchen  lisinopril (PRINIVIL,ZESTRIL) 10 MG tablet Take 10 mg by mouth daily.  . metolazone (ZAROXOLYN) 2.5 MG tablet Take 2.5 mg by mouth. On M,W,F 30 minutes before furosemide.  . Multiple Vitamins-Minerals (CENTRUM SILVER PO) Take 1 tablet by mouth daily.  Marland Kitchen oxyCODONE-acetaminophen (PERCOCET) 5-325 MG per tablet Take 1 tablet by mouth 4 (four) times daily as needed. For severe pain  . pantoprazole (PROTONIX) 40 MG tablet Take 40 mg by mouth 2 (two) times daily.  Vladimir Faster Glycol-Propyl Glycol (SYSTANE PRESERVATIVE FREE) 0.4-0.3 % SOLN Apply 1 drop to eye 4 (four) times daily as needed.  . potassium chloride SA (K-DUR,KLOR-CON) 20 MEQ tablet Take 20 mEq by mouth 3 (three) times daily.  . pregabalin (LYRICA) 300 MG capsule Take 300 mg by mouth 2 (two) times daily.  . rivaroxaban (XARELTO) 10 MG TABS tablet Take 20 mg by mouth daily.  Marland Kitchen zolpidem (AMBIEN) 10 MG tablet Take 10 mg by mouth at bedtime.  Marland Kitchen amitriptyline (ELAVIL) 25 MG tablet Take 25 mg by mouth at bedtime.  Marland Kitchen ipratropium-albuterol (DUONEB) 0.5-2.5 (3) MG/3ML SOLN Take 3 mLs by nebulization every 6 (six) hours as needed. For shortness of breath  . levofloxacin (LEVAQUIN) 500 MG tablet Take 1 tablet (500 mg total) by mouth daily.  Marland Kitchen lubiprostone (AMITIZA) 24 MCG capsule  Take 24 mcg by mouth daily with breakfast.  . magnesium oxide (MAG-OX) 400 MG tablet Take 1,600 mg by mouth daily.   . nitroGLYCERIN (NITROSTAT) 0.4 MG SL tablet Place 0.4 mg under the tongue every 5 (five) minutes as needed. For chest pain  . pramipexole (MIRAPEX) 1.5 MG tablet Take 1.5 mg by mouth at bedtime.  . torsemide (DEMADEX) 20 MG tablet Take 20 mg by mouth 2 (two) times daily.  Marland Kitchen triamcinolone (NASACORT) 55 MCG/ACT nasal inhaler Place 2 sprays into the nose daily.

## 2013-12-20 NOTE — Patient Instructions (Signed)
A bronchoscopy will be obtained  Take levaquin 500mg  daily for 7days Stay on advair No other changes in medications Return May 26 Jon [name]

## 2013-12-21 ENCOUNTER — Encounter: Payer: Self-pay | Admitting: Critical Care Medicine

## 2013-12-21 DIAGNOSIS — E669 Obesity, unspecified: Secondary | ICD-10-CM | POA: Insufficient documentation

## 2013-12-21 DIAGNOSIS — E1165 Type 2 diabetes mellitus with hyperglycemia: Secondary | ICD-10-CM

## 2013-12-21 DIAGNOSIS — J42 Unspecified chronic bronchitis: Secondary | ICD-10-CM | POA: Insufficient documentation

## 2013-12-21 DIAGNOSIS — G2581 Restless legs syndrome: Secondary | ICD-10-CM | POA: Insufficient documentation

## 2013-12-21 DIAGNOSIS — IMO0002 Reserved for concepts with insufficient information to code with codable children: Secondary | ICD-10-CM

## 2013-12-21 DIAGNOSIS — E785 Hyperlipidemia, unspecified: Secondary | ICD-10-CM | POA: Insufficient documentation

## 2013-12-21 DIAGNOSIS — E039 Hypothyroidism, unspecified: Secondary | ICD-10-CM | POA: Insufficient documentation

## 2013-12-21 DIAGNOSIS — I110 Hypertensive heart disease with heart failure: Secondary | ICD-10-CM | POA: Insufficient documentation

## 2013-12-21 DIAGNOSIS — Z8673 Personal history of transient ischemic attack (TIA), and cerebral infarction without residual deficits: Secondary | ICD-10-CM | POA: Insufficient documentation

## 2013-12-21 DIAGNOSIS — G4730 Sleep apnea, unspecified: Secondary | ICD-10-CM | POA: Insufficient documentation

## 2013-12-21 DIAGNOSIS — K219 Gastro-esophageal reflux disease without esophagitis: Secondary | ICD-10-CM | POA: Insufficient documentation

## 2013-12-21 DIAGNOSIS — E1142 Type 2 diabetes mellitus with diabetic polyneuropathy: Secondary | ICD-10-CM | POA: Insufficient documentation

## 2013-12-21 DIAGNOSIS — R042 Hemoptysis: Secondary | ICD-10-CM | POA: Insufficient documentation

## 2013-12-21 DIAGNOSIS — G629 Polyneuropathy, unspecified: Secondary | ICD-10-CM | POA: Insufficient documentation

## 2013-12-21 DIAGNOSIS — Z794 Long term (current) use of insulin: Secondary | ICD-10-CM

## 2013-12-21 DIAGNOSIS — I499 Cardiac arrhythmia, unspecified: Secondary | ICD-10-CM | POA: Insufficient documentation

## 2013-12-21 HISTORY — DX: Reserved for concepts with insufficient information to code with codable children: IMO0002

## 2013-12-21 HISTORY — DX: Type 2 diabetes mellitus with diabetic polyneuropathy: E11.42

## 2013-12-21 HISTORY — DX: Unspecified chronic bronchitis: J42

## 2013-12-21 HISTORY — DX: Type 2 diabetes mellitus with hyperglycemia: E11.65

## 2013-12-21 MED ORDER — DEXTROSE-NACL 5-0.45 % IV SOLN: INTRAVENOUS | Status: DC

## 2013-12-21 NOTE — Assessment & Plan Note (Signed)
Hemoptysis with asthmatic bronchitis Plan FOB Levaquin x 7days Cont advair

## 2013-12-21 NOTE — Assessment & Plan Note (Signed)
Smoking hx and ongoing hemoptysis with prob associated tracheobronchitis. CXR does not reveal any mass.   Ongoing use of NOAC Xarelto plus ASA heigtens bleed risk Plan A bronchoscopy will be obtained  Take levaquin 500mg  daily for 7days Stay on advair No other changes in medications

## 2013-12-22 ENCOUNTER — Ambulatory Visit (INDEPENDENT_AMBULATORY_CARE_PROVIDER_SITE_OTHER): Payer: Medicare Other | Admitting: Physician Assistant

## 2013-12-22 ENCOUNTER — Encounter (INDEPENDENT_AMBULATORY_CARE_PROVIDER_SITE_OTHER): Payer: Self-pay | Admitting: Physician Assistant

## 2013-12-22 VITALS — BP 134/80 | HR 75 | Temp 98.2°F | Ht 67.0 in | Wt 259.8 lb

## 2013-12-22 DIAGNOSIS — Z4651 Encounter for fitting and adjustment of gastric lap band: Secondary | ICD-10-CM | POA: Diagnosis not present

## 2013-12-22 NOTE — Progress Notes (Signed)
  HISTORY: Jon [name] is a 63 y.o.male who received an AP-Large lap-band in October 2010 by Dr. Excell Seltzer. He comes in today with close to 7 lbs weight loss. He has reduced his portion sizes considerably and is changing his diet to avoid sweets, namely potatoes, candies and soda. He has a recent history of hemoptysis for which he is to undergo a bronchoscopy next week. He denies regurgitation, reflux or night cough. He describes still having hunger and would like his portions to decrease further in size. His exercise tolerance is limited secondary to knee pain.  VITAL SIGNS: Filed Vitals:   12/22/13 0859  BP: 134/80  Pulse: 75  Temp: 98.2 F (36.8 C)    PHYSICAL EXAM: Physical exam reveals a very well-appearing 63 y.o.male in no apparent distress Neurologic: Awake, alert, oriented Psych: Bright affect, conversant Respiratory: Breathing even and unlabored. No stridor or wheezing Abdomen: Soft, nontender, nondistended to palpation. Incisions well-healed. No incisional hernias. Port easily palpated. Extremities: Atraumatic, good range of motion.  ASSESMENT: 63 y.o.  male  s/p AP-Large lap-band.   PLAN: The patient's port was accessed with a 20G Huber needle without difficulty. Clear fluid was aspirated and 0.5 mL saline was added to the port to give a total predicted volume of 9.5 mL. The patient was able to swallow water without difficulty following the procedure and was instructed to take clear liquids for the next 24-48 hours and advance slowly as tolerated.

## 2013-12-22 NOTE — Patient Instructions (Signed)

## 2013-12-23 ENCOUNTER — Ambulatory Visit (HOSPITAL_COMMUNITY): Admit: 2013-12-23 | Payer: Self-pay | Admitting: Critical Care Medicine

## 2013-12-23 ENCOUNTER — Encounter (HOSPITAL_COMMUNITY): Payer: Self-pay

## 2013-12-23 SURGERY — VIDEO BRONCHOSCOPY WITHOUT FLUORO
Anesthesia: Moderate Sedation

## 2013-12-26 DIAGNOSIS — IMO0002 Reserved for concepts with insufficient information to code with codable children: Secondary | ICD-10-CM | POA: Diagnosis not present

## 2013-12-26 DIAGNOSIS — J449 Chronic obstructive pulmonary disease, unspecified: Secondary | ICD-10-CM | POA: Diagnosis not present

## 2013-12-26 DIAGNOSIS — M1710 Unilateral primary osteoarthritis, unspecified knee: Secondary | ICD-10-CM | POA: Diagnosis not present

## 2013-12-26 DIAGNOSIS — Z01810 Encounter for preprocedural cardiovascular examination: Secondary | ICD-10-CM | POA: Diagnosis not present

## 2013-12-26 DIAGNOSIS — I4891 Unspecified atrial fibrillation: Secondary | ICD-10-CM | POA: Diagnosis not present

## 2013-12-26 DIAGNOSIS — I10 Essential (primary) hypertension: Secondary | ICD-10-CM | POA: Diagnosis not present

## 2013-12-26 DIAGNOSIS — I447 Left bundle-branch block, unspecified: Secondary | ICD-10-CM | POA: Diagnosis not present

## 2013-12-28 ENCOUNTER — Telehealth: Payer: Self-pay | Admitting: Critical Care Medicine

## 2013-12-28 NOTE — Telephone Encounter (Signed)
We received a faxed pulmonary optimization form from [facility] stating pt is planning a right total knee arthroplasty by Dr. Joya Salm in the near future.  Request "to have optimization on this pt prior to scheduling them for a surgery date."  PW has checked the box to "DO NOT proceed with surgery" with this instructions that we need to rx and stabilize this pt first.  I have faxed this form back and placed in scan folder.

## 2013-12-29 ENCOUNTER — Ambulatory Visit (HOSPITAL_COMMUNITY)
Admission: RE | Admit: 2013-12-29 | Discharge: 2013-12-29 | Disposition: A | Discharge status: Home / Self Care | Payer: Medicare Other | Source: Ambulatory Visit | Attending: Critical Care Medicine | Admitting: Critical Care Medicine

## 2013-12-29 ENCOUNTER — Encounter (HOSPITAL_COMMUNITY)
Admission: RE | Disposition: A | Discharge status: Home / Self Care | Payer: Self-pay | Source: Ambulatory Visit | Attending: Critical Care Medicine

## 2013-12-29 ENCOUNTER — Telehealth: Payer: Self-pay | Admitting: Critical Care Medicine

## 2013-12-29 DIAGNOSIS — R042 Hemoptysis: Secondary | ICD-10-CM

## 2013-12-29 DIAGNOSIS — Z794 Long term (current) use of insulin: Secondary | ICD-10-CM | POA: Diagnosis not present

## 2013-12-29 DIAGNOSIS — Z87891 Personal history of nicotine dependence: Secondary | ICD-10-CM | POA: Diagnosis not present

## 2013-12-29 DIAGNOSIS — Z880 Allergy status to penicillin: Secondary | ICD-10-CM | POA: Diagnosis not present

## 2013-12-29 DIAGNOSIS — G4733 Obstructive sleep apnea (adult) (pediatric): Secondary | ICD-10-CM | POA: Diagnosis not present

## 2013-12-29 DIAGNOSIS — J45909 Unspecified asthma, uncomplicated: Secondary | ICD-10-CM | POA: Diagnosis not present

## 2013-12-29 DIAGNOSIS — E785 Hyperlipidemia, unspecified: Secondary | ICD-10-CM | POA: Diagnosis not present

## 2013-12-29 DIAGNOSIS — Z8673 Personal history of transient ischemic attack (TIA), and cerebral infarction without residual deficits: Secondary | ICD-10-CM | POA: Insufficient documentation

## 2013-12-29 DIAGNOSIS — I4891 Unspecified atrial fibrillation: Secondary | ICD-10-CM | POA: Diagnosis not present

## 2013-12-29 DIAGNOSIS — E119 Type 2 diabetes mellitus without complications: Secondary | ICD-10-CM | POA: Diagnosis not present

## 2013-12-29 DIAGNOSIS — Z79899 Other long term (current) drug therapy: Secondary | ICD-10-CM | POA: Insufficient documentation

## 2013-12-29 DIAGNOSIS — IMO0002 Reserved for concepts with insufficient information to code with codable children: Secondary | ICD-10-CM

## 2013-12-29 DIAGNOSIS — M1710 Unilateral primary osteoarthritis, unspecified knee: Secondary | ICD-10-CM | POA: Insufficient documentation

## 2013-12-29 DIAGNOSIS — E669 Obesity, unspecified: Secondary | ICD-10-CM | POA: Diagnosis not present

## 2013-12-29 DIAGNOSIS — E039 Hypothyroidism, unspecified: Secondary | ICD-10-CM | POA: Insufficient documentation

## 2013-12-29 DIAGNOSIS — J209 Acute bronchitis, unspecified: Secondary | ICD-10-CM | POA: Insufficient documentation

## 2013-12-29 DIAGNOSIS — Z885 Allergy status to narcotic agent status: Secondary | ICD-10-CM | POA: Diagnosis not present

## 2013-12-29 HISTORY — PX: VIDEO BRONCHOSCOPY: SHX5072

## 2013-12-29 LAB — GLUCOSE, CAPILLARY
Glucose-Capillary: 88 mg/dL (ref 70–99)
Glucose-Capillary: 127 mg/dL — ABNORMAL HIGH (ref 70–99)

## 2013-12-29 SURGERY — VIDEO BRONCHOSCOPY WITHOUT FLUORO
Anesthesia: Moderate Sedation | Laterality: Bilateral

## 2013-12-29 MED ORDER — MOXIFLOXACIN HCL 400 MG PO TABS: 400.0000 mg | ORAL_TABLET | Freq: Every day | ORAL | Status: DC

## 2013-12-29 MED ORDER — LIDOCAINE HCL 2 % EX GEL: Freq: Once | CUTANEOUS | Status: DC

## 2013-12-29 MED ORDER — MIDAZOLAM HCL 10 MG/2ML IJ SOLN
INTRAMUSCULAR | Status: AC
  Filled 2013-12-29: qty 4

## 2013-12-29 MED ORDER — FENTANYL CITRATE 0.05 MG/ML IJ SOLN
INTRAMUSCULAR | Status: AC
  Filled 2013-12-29: qty 4

## 2013-12-29 MED ORDER — DEXTROSE 5 % IV SOLN
Freq: Once | INTRAVENOUS | Status: AC | PRN
  Administered 2013-12-29: 08:00:00 via INTRAVENOUS

## 2013-12-29 MED ORDER — PHENYLEPHRINE HCL 0.25 % NA SOLN
NASAL | Status: DC | PRN
  Administered 2013-12-29: 2 via NASAL

## 2013-12-29 MED ORDER — MIDAZOLAM HCL 10 MG/2ML IJ SOLN
INTRAMUSCULAR | Status: DC | PRN
  Administered 2013-12-29: 2 mg via INTRAVENOUS

## 2013-12-29 MED ORDER — LIDOCAINE HCL 1 % IJ SOLN
INTRAMUSCULAR | Status: DC | PRN
  Administered 2013-12-29: 6 mL via RESPIRATORY_TRACT

## 2013-12-29 MED ORDER — PHENYLEPHRINE HCL 0.25 % NA SOLN: 1.0000 | Freq: Four times a day (QID) | NASAL | Status: DC | PRN

## 2013-12-29 MED ORDER — HYDROCODONE-HOMATROPINE 5-1.5 MG/5ML PO SYRP: 5.0000 mL | ORAL_SOLUTION | Freq: Four times a day (QID) | ORAL | Status: DC | PRN

## 2013-12-29 MED ORDER — SODIUM CHLORIDE 0.9 % IV SOLN: Freq: Once | INTRAVENOUS | Status: DC

## 2013-12-29 MED ORDER — BUTAMBEN-TETRACAINE-BENZOCAINE 2-2-14 % EX AERO: 1.0000 | INHALATION_SPRAY | Freq: Once | CUTANEOUS | Status: DC

## 2013-12-29 MED ORDER — FENTANYL CITRATE 0.05 MG/ML IJ SOLN
INTRAMUSCULAR | Status: DC | PRN
  Administered 2013-12-29: 30 ug via INTRAVENOUS

## 2013-12-29 MED ORDER — SODIUM CHLORIDE 0.9 % IV SOLN
INTRAVENOUS | Status: DC
  Administered 2013-12-29: 07:00:00 via INTRAVENOUS

## 2013-12-29 MED ORDER — LIDOCAINE HCL 2 % EX GEL
CUTANEOUS | Status: DC | PRN
  Administered 2013-12-29: 1

## 2013-12-29 NOTE — Telephone Encounter (Signed)
Called spoke with pharm. Aware we are changing to biaxin.  Also called pt and he is aware as well.   When entering RX info into epic received a high risk warning box with drug to drug interaction b/w biaxin and diltiazem. Reports plasma concentration of macrolides may increase sudden death by cardiac causes. Please advise PW thanks  Allergies  Allergen Reactions  . Codeine Itching  . Penicillins Rash

## 2013-12-29 NOTE — Progress Notes (Signed)
Video bronchoscopy performed Intervention bronchial washings Pt tolerated well  Elam Dutch RRT  i was present for this and agree Elsie Stain

## 2013-12-29 NOTE — Telephone Encounter (Signed)
pls take the biaxin

## 2013-12-29 NOTE — Discharge Instructions (Signed)
Flexible Bronchoscopy, Care After These instructions give you information on caring for yourself after your procedure. Your doctor may also give you more specific instructions. Call your doctor if you have any problems or questions after your procedure. HOME CARE  Do not eat or drink anything for 2 hours after your procedure. If you try to eat or drink before the medicine wears off, food or drink could go into your lungs. You could also burn yourself.  After 2 hours have passed and when you can cough and gag normally, you may eat soft food and drink liquids slowly.  The day after the test, you may eat your normal diet.  You may do your normal activities.  Keep all doctor visits. GET HELP RIGHT AWAY IF:  You get more and more short of breath.  You get lightheaded.  You feel like you are going to pass out (faint).  You have chest pain.  You have new problems that worry you.  You cough up more than a little blood.  You cough up more blood than before. MAKE SURE YOU:  Understand these instructions.  Will watch your condition.  Will get help right away if you are not doing well or get worse. Document Released: 06/15/2009 Document Revised: 06/08/2013 Document Reviewed: 04/22/2013 [facility] Patient Information 2014 [place].  Do not eat or drink until after 1000 Today 12/29/2013   STOP LEVAQUIN START AVELOX one DAILY for 5 days Use hycodan as needed for cough

## 2013-12-29 NOTE — Op Note (Signed)
Bronchoscopy Procedure Note  Date of Operation: 12/29/2013  Pre-op Diagnosis: hemoptysis  Post-op Diagnosis: tracheobronchitis, no CA  Surgeon: Elsie Stain  Anesthesia: Monitored Local Anesthesia with Sedation:    Versed 2 mg IVP;  Fentanyl 30 mcg IVP  Operation: Flexible fiberoptic bronchoscopy, diagnostic   Findings: tracheobronchitis, no endobronchial lesions  Specimen: one, bronchial washing  Estimated Blood Loss: none  Complications: none  Indications and History: The patient is a 63 y.o. male with hemoptysis.  The risks, benefits, complications, treatment options and expected outcomes were discussed with the patient.  The possibilities of reaction to medication, pulmonary aspiration, perforation of a viscus, bleeding, failure to diagnose a condition and creating a complication requiring transfusion or operation were discussed with the patient who freely signed the consent.    Description of Procedure: The patient was re-examined in the bronchoscopy suite and the site of surgery properly noted/marked.  The patient was identified as Jon [name] and the procedure verified as Flexible Fiberoptic Bronchoscopy.  A Time Out was held and the above information confirmed.   After the induction of topical nasopharyngeal anesthesia, the patient was positioned  and the bronchoscope was passed through the R  nares. The vocal cords were visualized and  1% buffered lidocaine 5 ml was topically placed onto the cords. The cords were normal. The scope was then passed into the trachea.  1% buffered lidocaine 5 ml was used topically on the carina.  Careful inspection of the tracheal lumen was accomplished. The scope was sequentially passed into the left main and then left upper and lower bronchi and segmental bronchi.     Bronchial washing of LLL was done and there was one specimen.   The scope was then withdrawn and advanced into the right main bronchus and then into the RUL, RML, and RLL  bronchi and segmental bronchi.     Endobronchial findings: no endobronchial lesions, tracheobronchitis, friable mucosa. Trachea: Edematous mucosa Carina: Edematous mucosa,  Right main bronchus: Edematous mucosa, friable mucosa Right upper lobe bronchus: Edematous mucosa Right middle lobe bronchus: Edematous mucosa Right lower lobe bronchus: Edematous mucosa Left main bronchus: Edematous mucosa Left upper lobe bronchus: Edematous mucosa Left lower lobe bronchus: Edematous mucosa, friable mucosa. Mucus excess , white  The Patient was taken to the Endoscopy Recovery area in satisfactory condition.  Attestation: I performed the procedure.  Burnett Harry WrightMD 12/29/2013 8:06 AM

## 2013-12-29 NOTE — Telephone Encounter (Signed)
He failed levaquin  So stop avelox Try Biaxin 500mg  bid x 7 days

## 2013-12-29 NOTE — Interval H&P Note (Signed)
Pt seen and examined.  The pt is ready for planned FOB. Jon Harry WrightMD

## 2013-12-29 NOTE — H&P (View-Only) (Signed)
Subjective:    Patient ID: Jon [name], male    DOB: August 22, 1951, 63 y.o.   MRN: [id number]  HPI Hemoptysis started end of 10/2013.  Pure blood.   Dx bronchitis.  Noted wheezing.  Pt is on xarelto.   Last time coughed blood was one week ago. Has never done this before.   Also: DOE, wheezes. Notes GERD daily. Also sinus pressure Needs R TKR.  Had prior sinus surgery 2x All pcn/codeine  Hx of CHF.  Atrial fib.  [place] is cardiology Hx lap band surgery [facility]  Dx: asthma, DM2 on pump, hyperlipidemia OSA on cpap plus oxygen 2L DME: [employer]  CBGs 140- 160  Cough is better.  Still wheezing. On advair  ex smoker 5 cigars per day for 80yrs  Past Medical History  Diagnosis Date  . Hypothyroidism   . Dysrhythmia     atrial fib takes cardizem,   . Hypertension   . Angina   . Stroke 2012  . Restless leg syndrome   . GERD (gastroesophageal reflux disease)   . Sleep apnea     cpap & oxygen  . Diabetes mellitus     insulin pump   . Pneumonia     hx of  . Hyperlipemia   . H/O hiatal hernia   . Neuromuscular disorder     rls, neuropathy  . Arthritis     right knee  . Obesity (BMI 30-39.9)      Family History  Problem Relation Age of Onset  . Anesthesia problems Neg Hx   . Hypotension Neg Hx   . Malignant hyperthermia Neg Hx   . Pseudochol deficiency Neg Hx   . Cancer Mother      History   Social History  . Marital Status: Married    Spouse Name: N/A    Number of Children: N/A  . Years of Education: N/A   Occupational History  . Not on file.   Social History Main Topics  . Smoking status: Former Smoker -- 4 years    Types: Cigars    Quit date: 09/01/2006  . Smokeless tobacco: Never Used  . Alcohol Use: No     Comment: quit 8 years ago  . Drug Use: Not on file  . Sexual Activity: Not on file   Other Topics Concern  . Not on file   Social History Narrative  . No narrative on file     Allergies  Allergen Reactions  . Codeine Itching  .  Penicillins Rash     Outpatient Prescriptions Prior to Visit  Medication Sig Dispense Refill  . atorvastatin (LIPITOR) 40 MG tablet Take 40 mg by mouth daily.      . Calcium Carbonate-Vitamin D (CALTRATE 600+D PO) Take 2 tablets by mouth 2 (two) times daily.       Marland Kitchen diltiazem (CARDIZEM) 30 MG tablet Take 30 mg by mouth 2 (two) times daily.      . fentaNYL (DURAGESIC - DOSED MCG/HR) 75 MCG/HR Place 1 patch onto the skin every 3 (three) days.      . Fluticasone-Salmeterol (ADVAIR) 500-50 MCG/DOSE AEPB Inhale 1 puff into the lungs every 12 (twelve) hours.      . Insulin Human (INSULIN PUMP) 100 unit/ml SOLN Inject into the skin continuous. novolog      . isosorbide mononitrate (IMDUR) 60 MG 24 hr tablet Take 60 mg by mouth every morning.      Marland Kitchen lisinopril (PRINIVIL,ZESTRIL) 10 MG tablet Take 10 mg by  mouth daily.      . Multiple Vitamins-Minerals (CENTRUM SILVER PO) Take 1 tablet by mouth daily.      Marland Kitchen oxyCODONE-acetaminophen (PERCOCET) 5-325 MG per tablet Take 1 tablet by mouth 4 (four) times daily as needed. For severe pain      . pantoprazole (PROTONIX) 40 MG tablet Take 40 mg by mouth 2 (two) times daily.      . potassium chloride SA (K-DUR,KLOR-CON) 20 MEQ tablet Take 20 mEq by mouth 3 (three) times daily.      . pregabalin (LYRICA) 300 MG capsule Take 300 mg by mouth 2 (two) times daily.      . rivaroxaban (XARELTO) 10 MG TABS tablet Take 20 mg by mouth daily.      Marland Kitchen zolpidem (AMBIEN) 10 MG tablet Take 10 mg by mouth at bedtime.      Marland Kitchen amitriptyline (ELAVIL) 25 MG tablet Take 25 mg by mouth at bedtime.      Marland Kitchen ipratropium-albuterol (DUONEB) 0.5-2.5 (3) MG/3ML SOLN Take 3 mLs by nebulization every 6 (six) hours as needed. For shortness of breath      . lubiprostone (AMITIZA) 24 MCG capsule Take 24 mcg by mouth daily with breakfast.      . magnesium oxide (MAG-OX) 400 MG tablet Take 1,600 mg by mouth daily.       . nitroGLYCERIN (NITROSTAT) 0.4 MG SL tablet Place 0.4 mg under the tongue every  5 (five) minutes as needed. For chest pain      . pramipexole (MIRAPEX) 1.5 MG tablet Take 1.5 mg by mouth at bedtime.      . torsemide (DEMADEX) 20 MG tablet Take 20 mg by mouth 2 (two) times daily.      Marland Kitchen triamcinolone (NASACORT) 55 MCG/ACT nasal inhaler Place 2 sprays into the nose daily.       No facility-administered medications prior to visit.        Review of Systems Constitutional:   No  weight loss, night sweats,  Fevers, chills, fatigue, lassitude. HEENT:   No headaches,  Difficulty swallowing,  Tooth/dental problems,  Sore throat,                No sneezing, itching, ear ache, nasal congestion, post nasal drip,   CV:  No chest pain,  Orthopnea, PND, swelling in lower extremities, anasarca, dizziness, palpitations  GI  No heartburn, indigestion, abdominal pain, nausea, vomiting, diarrhea, change in bowel habits, loss of appetite  Resp: No shortness of breath with exertion or at rest.  No excess mucus, no productive cough,  No non-productive cough,  +++coughing up of blood.  No change in color of mucus.  No wheezing.  No chest wall deformity  Skin: no rash or lesions.  GU: no dysuria, change in color of urine, no urgency or frequency.  No flank pain.  MS:  No joint pain or swelling.  No decreased range of motion.  No back pain.  Psych:  No change in mood or affect. No depression or anxiety.  No memory loss.     Objective:   Physical Exam Filed Vitals:   12/20/13 1348  BP: 134/70  Pulse: 70  Temp: 98.9 F (37.2 C)  TempSrc: Oral  Height: 5\' 9"  (1.753 m)  Weight: 263 lb (119.296 kg)  SpO2: 95%    Gen: Pleasant, obese, in no distress,  normal affect  ENT: No lesions,  mouth clear,  oropharynx clear, no postnasal drip  Neck: No JVD, no TMG, no carotid bruits  Lungs: No use of accessory muscles, no dullness to percussion, distant bs  Cardiovascular: RRR, heart sounds normal, no murmur or gallops, no peripheral edema  Abdomen: soft and NT, no HSM,  BS normal  insulin pump in place   Musculoskeletal: No deformities, no cyanosis or clubbing  Neuro: alert, non focal  Skin: Warm, no lesions or rashes  No results found. CXR: NAD        Assessment & Plan:   Hemoptysis Smoking hx and ongoing hemoptysis with prob associated tracheobronchitis. CXR does not reveal any mass.   Ongoing use of NOAC Xarelto plus ASA heigtens bleed risk Plan A bronchoscopy will be obtained  Take levaquin 500mg  daily for 7days Stay on advair No other changes in medications  Obstructive chronic bronchitis with exacerbation Hemoptysis with asthmatic bronchitis Plan FOB Levaquin x 7days Cont advair    Updated Medication List Outpatient Encounter Prescriptions as of 12/20/2013  Medication Sig  . albuterol (PROVENTIL HFA;VENTOLIN HFA) 108 (90 BASE) MCG/ACT inhaler Inhale 2 puffs into the lungs every 6 (six) hours as needed for wheezing or shortness of breath.  Marland Kitchen atorvastatin (LIPITOR) 40 MG tablet Take 40 mg by mouth daily.  Marland Kitchen azelastine (ASTELIN) 137 MCG/SPRAY nasal spray Place 1 spray into both nostrils at bedtime. Use in each nostril as directed  . Calcium Carbonate-Vitamin D (CALTRATE 600+D PO) Take 2 tablets by mouth 2 (two) times daily.   Marland Kitchen diltiazem (CARDIZEM) 30 MG tablet Take 30 mg by mouth 2 (two) times daily.  . fentaNYL (DURAGESIC - DOSED MCG/HR) 75 MCG/HR Place 1 patch onto the skin every 3 (three) days.  . fluticasone (FLONASE) 50 MCG/ACT nasal spray Place 1 spray into both nostrils daily.  . Fluticasone-Salmeterol (ADVAIR) 500-50 MCG/DOSE AEPB Inhale 1 puff into the lungs every 12 (twelve) hours.  . hydrALAZINE (APRESOLINE) 50 MG tablet Take 50 mg by mouth 4 (four) times daily.  . Insulin Human (INSULIN PUMP) 100 unit/ml SOLN Inject into the skin continuous. novolog  . isosorbide mononitrate (IMDUR) 60 MG 24 hr tablet Take 60 mg by mouth every morning.  . latanoprost (XALATAN) 0.005 % ophthalmic solution Place 1 drop into both eyes at bedtime.  Marland Kitchen  lisinopril (PRINIVIL,ZESTRIL) 10 MG tablet Take 10 mg by mouth daily.  . metolazone (ZAROXOLYN) 2.5 MG tablet Take 2.5 mg by mouth. On M,W,F 30 minutes before furosemide.  . Multiple Vitamins-Minerals (CENTRUM SILVER PO) Take 1 tablet by mouth daily.  Marland Kitchen oxyCODONE-acetaminophen (PERCOCET) 5-325 MG per tablet Take 1 tablet by mouth 4 (four) times daily as needed. For severe pain  . pantoprazole (PROTONIX) 40 MG tablet Take 40 mg by mouth 2 (two) times daily.  Vladimir Faster Glycol-Propyl Glycol (SYSTANE PRESERVATIVE FREE) 0.4-0.3 % SOLN Apply 1 drop to eye 4 (four) times daily as needed.  . potassium chloride SA (K-DUR,KLOR-CON) 20 MEQ tablet Take 20 mEq by mouth 3 (three) times daily.  . pregabalin (LYRICA) 300 MG capsule Take 300 mg by mouth 2 (two) times daily.  . rivaroxaban (XARELTO) 10 MG TABS tablet Take 20 mg by mouth daily.  Marland Kitchen zolpidem (AMBIEN) 10 MG tablet Take 10 mg by mouth at bedtime.  Marland Kitchen amitriptyline (ELAVIL) 25 MG tablet Take 25 mg by mouth at bedtime.  Marland Kitchen ipratropium-albuterol (DUONEB) 0.5-2.5 (3) MG/3ML SOLN Take 3 mLs by nebulization every 6 (six) hours as needed. For shortness of breath  . levofloxacin (LEVAQUIN) 500 MG tablet Take 1 tablet (500 mg total) by mouth daily.  Marland Kitchen lubiprostone (AMITIZA) 24 MCG capsule  Take 24 mcg by mouth daily with breakfast.  . magnesium oxide (MAG-OX) 400 MG tablet Take 1,600 mg by mouth daily.   . nitroGLYCERIN (NITROSTAT) 0.4 MG SL tablet Place 0.4 mg under the tongue every 5 (five) minutes as needed. For chest pain  . pramipexole (MIRAPEX) 1.5 MG tablet Take 1.5 mg by mouth at bedtime.  . torsemide (DEMADEX) 20 MG tablet Take 20 mg by mouth 2 (two) times daily.  Marland Kitchen triamcinolone (NASACORT) 55 MCG/ACT nasal inhaler Place 2 sprays into the nose daily.

## 2013-12-29 NOTE — Telephone Encounter (Signed)
Called [facility] to make aware we have message over to the doctor regarding reaction. I was advised by the pharmacists they did not have this drug to drug interaction come up with the diltiazem. He ran this again and showed no drug to drug interaction with any of pt medications including diltiazem. Pt just picked up RX as well and has left store. Well make PW aware.

## 2013-12-29 NOTE — Telephone Encounter (Signed)
Called and spoke with pharmacy and they stated that the pts insurance will not cover any of the avelox.  Pharmacy is requesting to change this to levofloxacin so the pt can start on a covered medication.  PW please advise. Thanks  Allergies  Allergen Reactions  . Codeine Itching  . Penicillins Rash    Current Outpatient Prescriptions on File Prior to Visit  Medication Sig Dispense Refill  . albuterol (PROVENTIL HFA;VENTOLIN HFA) 108 (90 BASE) MCG/ACT inhaler Inhale 2 puffs into the lungs every 6 (six) hours as needed for wheezing or shortness of breath.      Marland Kitchen amitriptyline (ELAVIL) 25 MG tablet Take 25 mg by mouth at bedtime.      Marland Kitchen atorvastatin (LIPITOR) 40 MG tablet Take 40 mg by mouth daily.      Marland Kitchen azelastine (ASTELIN) 137 MCG/SPRAY nasal spray Place 1 spray into both nostrils at bedtime. Use in each nostril as directed      . Calcium Carbonate-Vitamin D (CALTRATE 600+D PO) Take 2 tablets by mouth 2 (two) times daily.       Marland Kitchen diltiazem (CARDIZEM) 30 MG tablet Take 30 mg by mouth 2 (two) times daily.      . fentaNYL (DURAGESIC - DOSED MCG/HR) 75 MCG/HR Place 1 patch onto the skin every 3 (three) days.      . fluticasone (FLONASE) 50 MCG/ACT nasal spray Place 1 spray into both nostrils daily.      . Fluticasone-Salmeterol (ADVAIR) 500-50 MCG/DOSE AEPB Inhale 1 puff into the lungs every 12 (twelve) hours.      . hydrALAZINE (APRESOLINE) 50 MG tablet Take 50 mg by mouth 4 (four) times daily.      Marland Kitchen HYDROcodone-homatropine (HYCODAN) 5-1.5 MG/5ML syrup Take 5 mLs by mouth every 6 (six) hours as needed for cough.  120 mL  0  . Insulin Human (INSULIN PUMP) 100 unit/ml SOLN Inject into the skin continuous. novolog      . ipratropium-albuterol (DUONEB) 0.5-2.5 (3) MG/3ML SOLN Take 3 mLs by nebulization every 6 (six) hours as needed. For shortness of breath      . isosorbide mononitrate (IMDUR) 60 MG 24 hr tablet Take 60 mg by mouth every morning.      . latanoprost (XALATAN) 0.005 % ophthalmic  solution Place 1 drop into both eyes at bedtime.      Marland Kitchen lisinopril (PRINIVIL,ZESTRIL) 10 MG tablet Take 10 mg by mouth daily.      Marland Kitchen lubiprostone (AMITIZA) 24 MCG capsule Take 24 mcg by mouth daily with breakfast.      . magnesium oxide (MAG-OX) 400 MG tablet Take 1,600 mg by mouth daily.       . metolazone (ZAROXOLYN) 2.5 MG tablet Take 2.5 mg by mouth. On M,W,F 30 minutes before furosemide.      . moxifloxacin (AVELOX) 400 MG tablet Take 1 tablet (400 mg total) by mouth daily.  5 tablet  0  . Multiple Vitamins-Minerals (CENTRUM SILVER PO) Take 1 tablet by mouth daily.      . nitroGLYCERIN (NITROSTAT) 0.4 MG SL tablet Place 0.4 mg under the tongue every 5 (five) minutes as needed. For chest pain      . oxyCODONE-acetaminophen (PERCOCET) 5-325 MG per tablet Take 1 tablet by mouth 4 (four) times daily as needed. For severe pain      . pantoprazole (PROTONIX) 40 MG tablet Take 40 mg by mouth 2 (two) times daily.      Vladimir Faster Glycol-Propyl Glycol (SYSTANE PRESERVATIVE FREE) 0.4-0.3 %  SOLN Apply 1 drop to eye 4 (four) times daily as needed.      . potassium chloride SA (K-DUR,KLOR-CON) 20 MEQ tablet Take 20 mEq by mouth 3 (three) times daily.      . pramipexole (MIRAPEX) 1.5 MG tablet Take 1.5 mg by mouth at bedtime.      . pregabalin (LYRICA) 300 MG capsule Take 300 mg by mouth 2 (two) times daily.      . rivaroxaban (XARELTO) 10 MG TABS tablet Take 20 mg by mouth daily.      Marland Kitchen torsemide (DEMADEX) 20 MG tablet Take 20 mg by mouth 2 (two) times daily.      Marland Kitchen triamcinolone (NASACORT) 55 MCG/ACT nasal inhaler Place 2 sprays into the nose daily.      Marland Kitchen zolpidem (AMBIEN) 10 MG tablet Take 10 mg by mouth at bedtime.       Current Facility-Administered Medications on File Prior to Visit  Medication Dose Route Frequency Provider Last Rate Last Dose  . 0.9 %  sodium chloride infusion   Intravenous Continuous Elsie Stain, MD      . 0.9 %  sodium chloride infusion   Intravenous Once Elsie Stain, MD      . dextrose 5 %-0.45 % sodium chloride infusion   Intravenous Continuous Elsie Stain, MD

## 2013-12-30 ENCOUNTER — Encounter (HOSPITAL_COMMUNITY): Payer: Self-pay | Admitting: Critical Care Medicine

## 2013-12-30 DIAGNOSIS — J189 Pneumonia, unspecified organism: Secondary | ICD-10-CM | POA: Insufficient documentation

## 2013-12-30 HISTORY — DX: Pneumonia, unspecified organism: J18.9

## 2014-01-02 ENCOUNTER — Telehealth: Payer: Self-pay | Admitting: Critical Care Medicine

## 2014-01-02 LAB — CULTURE, RESPIRATORY: SPECIAL REQUESTS: NORMAL

## 2014-01-02 LAB — CULTURE, RESPIRATORY W GRAM STAIN

## 2014-01-02 MED ORDER — LOSARTAN POTASSIUM 50 MG PO TABS: 50.0000 mg | ORAL_TABLET | Freq: Every day | ORAL | Status: DC

## 2014-01-02 NOTE — Telephone Encounter (Signed)
Pt is still coughing up blood Pt on xarelto Pt with continued cough despite ABX  Plan: D/c xarelto D/c lisinopril Start losartan 50mg  /d Rx sent  Needs OV soon  ?work in with dr Nelda Marseille tomorrow afternoon?

## 2014-01-03 ENCOUNTER — Encounter: Payer: Self-pay | Admitting: Critical Care Medicine

## 2014-01-03 ENCOUNTER — Inpatient Hospital Stay (HOSPITAL_COMMUNITY): Payer: Medicare Other

## 2014-01-03 ENCOUNTER — Ambulatory Visit (INDEPENDENT_AMBULATORY_CARE_PROVIDER_SITE_OTHER): Payer: Medicare Other | Admitting: Critical Care Medicine

## 2014-01-03 ENCOUNTER — Inpatient Hospital Stay (HOSPITAL_COMMUNITY)
Admission: AD | Admit: 2014-01-03 | Discharge: 2014-01-05 | DRG: 194 | Disposition: A | Discharge status: Home / Self Care | Payer: Medicare Other | Source: Ambulatory Visit | Attending: Critical Care Medicine | Admitting: Critical Care Medicine

## 2014-01-03 ENCOUNTER — Encounter (HOSPITAL_COMMUNITY): Payer: Self-pay | Admitting: Urology

## 2014-01-03 VITALS — BP 106/62 | HR 83 | Temp 99.1°F | Ht 67.0 in | Wt 260.6 lb

## 2014-01-03 DIAGNOSIS — E669 Obesity, unspecified: Secondary | ICD-10-CM | POA: Diagnosis present

## 2014-01-03 DIAGNOSIS — E785 Hyperlipidemia, unspecified: Secondary | ICD-10-CM | POA: Diagnosis present

## 2014-01-03 DIAGNOSIS — Z9884 Bariatric surgery status: Secondary | ICD-10-CM

## 2014-01-03 DIAGNOSIS — J189 Pneumonia, unspecified organism: Secondary | ICD-10-CM

## 2014-01-03 DIAGNOSIS — I3139 Other pericardial effusion (noninflammatory): Secondary | ICD-10-CM | POA: Diagnosis present

## 2014-01-03 DIAGNOSIS — K219 Gastro-esophageal reflux disease without esophagitis: Secondary | ICD-10-CM | POA: Diagnosis present

## 2014-01-03 DIAGNOSIS — G4733 Obstructive sleep apnea (adult) (pediatric): Secondary | ICD-10-CM | POA: Diagnosis present

## 2014-01-03 DIAGNOSIS — E1142 Type 2 diabetes mellitus with diabetic polyneuropathy: Secondary | ICD-10-CM | POA: Diagnosis present

## 2014-01-03 DIAGNOSIS — M1990 Unspecified osteoarthritis, unspecified site: Secondary | ICD-10-CM | POA: Diagnosis present

## 2014-01-03 DIAGNOSIS — E039 Hypothyroidism, unspecified: Secondary | ICD-10-CM | POA: Diagnosis present

## 2014-01-03 DIAGNOSIS — R042 Hemoptysis: Secondary | ICD-10-CM | POA: Diagnosis present

## 2014-01-03 DIAGNOSIS — Z9641 Presence of insulin pump (external) (internal): Secondary | ICD-10-CM | POA: Diagnosis not present

## 2014-01-03 DIAGNOSIS — R609 Edema, unspecified: Secondary | ICD-10-CM | POA: Diagnosis not present

## 2014-01-03 DIAGNOSIS — J441 Chronic obstructive pulmonary disease with (acute) exacerbation: Secondary | ICD-10-CM | POA: Diagnosis present

## 2014-01-03 DIAGNOSIS — I2789 Other specified pulmonary heart diseases: Secondary | ICD-10-CM | POA: Diagnosis present

## 2014-01-03 DIAGNOSIS — Z7901 Long term (current) use of anticoagulants: Secondary | ICD-10-CM | POA: Diagnosis not present

## 2014-01-03 DIAGNOSIS — E1149 Type 2 diabetes mellitus with other diabetic neurological complication: Secondary | ICD-10-CM | POA: Diagnosis present

## 2014-01-03 DIAGNOSIS — J42 Unspecified chronic bronchitis: Secondary | ICD-10-CM | POA: Diagnosis present

## 2014-01-03 DIAGNOSIS — Z8673 Personal history of transient ischemic attack (TIA), and cerebral infarction without residual deficits: Secondary | ICD-10-CM

## 2014-01-03 DIAGNOSIS — J13 Pneumonia due to Streptococcus pneumoniae: Principal | ICD-10-CM | POA: Diagnosis present

## 2014-01-03 DIAGNOSIS — R509 Fever, unspecified: Secondary | ICD-10-CM | POA: Diagnosis not present

## 2014-01-03 DIAGNOSIS — R918 Other nonspecific abnormal finding of lung field: Secondary | ICD-10-CM | POA: Diagnosis not present

## 2014-01-03 DIAGNOSIS — I319 Disease of pericardium, unspecified: Secondary | ICD-10-CM

## 2014-01-03 DIAGNOSIS — G2581 Restless legs syndrome: Secondary | ICD-10-CM | POA: Diagnosis present

## 2014-01-03 DIAGNOSIS — I5030 Unspecified diastolic (congestive) heart failure: Secondary | ICD-10-CM | POA: Diagnosis present

## 2014-01-03 DIAGNOSIS — I4891 Unspecified atrial fibrillation: Secondary | ICD-10-CM

## 2014-01-03 DIAGNOSIS — I313 Pericardial effusion (noninflammatory): Secondary | ICD-10-CM | POA: Diagnosis present

## 2014-01-03 DIAGNOSIS — Z794 Long term (current) use of insulin: Secondary | ICD-10-CM | POA: Diagnosis not present

## 2014-01-03 DIAGNOSIS — I10 Essential (primary) hypertension: Secondary | ICD-10-CM | POA: Diagnosis present

## 2014-01-03 DIAGNOSIS — E1165 Type 2 diabetes mellitus with hyperglycemia: Secondary | ICD-10-CM

## 2014-01-03 DIAGNOSIS — I482 Chronic atrial fibrillation, unspecified: Secondary | ICD-10-CM

## 2014-01-03 DIAGNOSIS — Z6841 Body Mass Index (BMI) 40.0 and over, adult: Secondary | ICD-10-CM | POA: Diagnosis not present

## 2014-01-03 HISTORY — DX: Chronic atrial fibrillation, unspecified: I48.20

## 2014-01-03 LAB — CBC WITH DIFFERENTIAL/PLATELET
Basophils Absolute: 0 10*3/uL (ref 0.0–0.1)
Basophils Relative: 0 % (ref 0–1)
Eosinophils Absolute: 0 10*3/uL (ref 0.0–0.7)
Eosinophils Relative: 0 % (ref 0–5)
HCT: 43.5 % (ref 39.0–52.0)
HEMOGLOBIN: 14 g/dL (ref 13.0–17.0)
LYMPHS ABS: 1.6 10*3/uL (ref 0.7–4.0)
Lymphocytes Relative: 19 % (ref 12–46)
MCH: 27.2 pg (ref 26.0–34.0)
MCHC: 32.2 g/dL (ref 30.0–36.0)
MCV: 84.6 fL (ref 78.0–100.0)
MONOS PCT: 8 % (ref 3–12)
Monocytes Absolute: 0.7 10*3/uL (ref 0.1–1.0)
NEUTROS ABS: 6 10*3/uL (ref 1.7–7.7)
NEUTROS PCT: 73 % (ref 43–77)
Platelets: 151 10*3/uL (ref 150–400)
RBC: 5.14 MIL/uL (ref 4.22–5.81)
RDW: 14.1 % (ref 11.5–15.5)
WBC: 8.3 10*3/uL (ref 4.0–10.5)

## 2014-01-03 LAB — COMPREHENSIVE METABOLIC PANEL
ALT: 24 U/L (ref 0–53)
AST: 23 U/L (ref 0–37)
Albumin: 3.5 g/dL (ref 3.5–5.2)
Alkaline Phosphatase: 79 U/L (ref 39–117)
BUN: 22 mg/dL (ref 6–23)
CALCIUM: 9.3 mg/dL (ref 8.4–10.5)
CO2: 27 meq/L (ref 19–32)
CREATININE: 1.23 mg/dL (ref 0.50–1.35)
Chloride: 104 mEq/L (ref 96–112)
GFR calc non Af Amer: 60 mL/min — ABNORMAL LOW (ref >90)
GFR, EST AFRICAN AMERICAN: 69 mL/min — AB (ref >90)
GLUCOSE: 66 mg/dL — AB (ref 70–99)
Potassium: 4.9 mEq/L (ref 3.7–5.3)
Sodium: 142 mEq/L (ref 137–147)
Total Bilirubin: 1.8 mg/dL — ABNORMAL HIGH (ref 0.3–1.2)
Total Protein: 6.6 g/dL (ref 6.0–8.3)

## 2014-01-03 LAB — GLUCOSE, CAPILLARY
GLUCOSE-CAPILLARY: 70 mg/dL (ref 70–99)
GLUCOSE-CAPILLARY: 158 mg/dL — AB (ref 70–99)

## 2014-01-03 LAB — LEGIONELLA CULTURE: Special Requests: NORMAL

## 2014-01-03 MED ORDER — PREGABALIN 75 MG PO CAPS
75.0000 mg | ORAL_CAPSULE | Freq: Two times a day (BID) | ORAL | Status: DC
  Administered 2014-01-03 – 2014-01-05 (×4): 75 mg via ORAL
  Filled 2014-01-03 (×4): qty 1

## 2014-01-03 MED ORDER — INSULIN PUMP
SUBCUTANEOUS | Status: DC
  Filled 2014-01-03: qty 1

## 2014-01-03 MED ORDER — SODIUM CHLORIDE 0.9 % IJ SOLN: 3.0000 mL | INTRAMUSCULAR | Status: DC | PRN

## 2014-01-03 MED ORDER — ALBUTEROL SULFATE (2.5 MG/3ML) 0.083% IN NEBU
2.5000 mg | INHALATION_SOLUTION | Freq: Four times a day (QID) | RESPIRATORY_TRACT | Status: DC
  Administered 2014-01-03 – 2014-01-04 (×4): 2.5 mg via RESPIRATORY_TRACT
  Filled 2014-01-03 (×6): qty 3

## 2014-01-03 MED ORDER — DILTIAZEM HCL 30 MG PO TABS
30.0000 mg | ORAL_TABLET | Freq: Two times a day (BID) | ORAL | Status: DC
  Administered 2014-01-03 – 2014-01-05 (×4): 30 mg via ORAL
  Filled 2014-01-03 (×5): qty 1

## 2014-01-03 MED ORDER — TORSEMIDE 20 MG PO TABS
20.0000 mg | ORAL_TABLET | Freq: Two times a day (BID) | ORAL | Status: DC
  Administered 2014-01-04 – 2014-01-05 (×3): 20 mg via ORAL
  Filled 2014-01-03 (×5): qty 1

## 2014-01-03 MED ORDER — FLUTICASONE PROPIONATE 50 MCG/ACT NA SUSP
1.0000 | Freq: Every day | NASAL | Status: DC
  Administered 2014-01-04 – 2014-01-05 (×2): 1 via NASAL
  Filled 2014-01-03: qty 16

## 2014-01-03 MED ORDER — PANTOPRAZOLE SODIUM 40 MG PO TBEC
40.0000 mg | DELAYED_RELEASE_TABLET | Freq: Two times a day (BID) | ORAL | Status: DC
  Administered 2014-01-03 – 2014-01-05 (×4): 40 mg via ORAL
  Filled 2014-01-03 (×6): qty 1

## 2014-01-03 MED ORDER — SODIUM CHLORIDE 0.9 % IV SOLN: 250.0000 mL | INTRAVENOUS | Status: DC | PRN

## 2014-01-03 MED ORDER — PRAMIPEXOLE DIHYDROCHLORIDE 1.5 MG PO TABS
1.5000 mg | ORAL_TABLET | Freq: Every day | ORAL | Status: DC
  Administered 2014-01-03 – 2014-01-04 (×2): 1.5 mg via ORAL
  Filled 2014-01-03 (×4): qty 1

## 2014-01-03 MED ORDER — METOLAZONE 2.5 MG PO TABS
2.5000 mg | ORAL_TABLET | ORAL | Status: DC
  Administered 2014-01-04: 2.5 mg via ORAL
  Filled 2014-01-03 (×2): qty 1

## 2014-01-03 MED ORDER — SODIUM CHLORIDE 0.9 % IJ SOLN
3.0000 mL | Freq: Two times a day (BID) | INTRAMUSCULAR | Status: DC
  Administered 2014-01-03 – 2014-01-05 (×3): 3 mL via INTRAVENOUS

## 2014-01-03 MED ORDER — OXYCODONE-ACETAMINOPHEN 5-325 MG PO TABS: 1.0000 | ORAL_TABLET | Freq: Four times a day (QID) | ORAL | Status: DC | PRN

## 2014-01-03 MED ORDER — LOSARTAN POTASSIUM 50 MG PO TABS
50.0000 mg | ORAL_TABLET | Freq: Every day | ORAL | Status: DC
  Administered 2014-01-03 – 2014-01-05 (×3): 50 mg via ORAL
  Filled 2014-01-03 (×3): qty 1

## 2014-01-03 MED ORDER — DEXTROSE 5 % IV SOLN
500.0000 mg | INTRAVENOUS | Status: DC
  Administered 2014-01-03 – 2014-01-04 (×2): 500 mg via INTRAVENOUS
  Filled 2014-01-03 (×3): qty 500

## 2014-01-03 MED ORDER — HYDRALAZINE HCL 50 MG PO TABS
50.0000 mg | ORAL_TABLET | Freq: Four times a day (QID) | ORAL | Status: DC
  Administered 2014-01-03 – 2014-01-05 (×7): 50 mg via ORAL
  Filled 2014-01-03 (×10): qty 1

## 2014-01-03 MED ORDER — METHYLPREDNISOLONE SODIUM SUCC 40 MG IJ SOLR
40.0000 mg | Freq: Two times a day (BID) | INTRAMUSCULAR | Status: DC
  Administered 2014-01-03 – 2014-01-04 (×2): 40 mg via INTRAVENOUS
  Filled 2014-01-03 (×4): qty 1

## 2014-01-03 MED ORDER — POTASSIUM CHLORIDE CRYS ER 20 MEQ PO TBCR
20.0000 meq | EXTENDED_RELEASE_TABLET | Freq: Three times a day (TID) | ORAL | Status: DC
  Administered 2014-01-03: 20 meq via ORAL
  Filled 2014-01-03 (×4): qty 1

## 2014-01-03 MED ORDER — FENTANYL 75 MCG/HR TD PT72
75.0000 ug | MEDICATED_PATCH | TRANSDERMAL | Status: DC
  Filled 2014-01-03: qty 1

## 2014-01-03 MED ORDER — ISOSORBIDE MONONITRATE ER 60 MG PO TB24
60.0000 mg | ORAL_TABLET | Freq: Every morning | ORAL | Status: DC
  Administered 2014-01-04 – 2014-01-05 (×2): 60 mg via ORAL
  Filled 2014-01-03 (×2): qty 1

## 2014-01-03 MED ORDER — INSULIN ASPART 100 UNIT/ML ~~LOC~~ SOLN
0.0000 [IU] | Freq: Three times a day (TID) | SUBCUTANEOUS | Status: DC
  Administered 2014-01-04: 4 [IU] via SUBCUTANEOUS

## 2014-01-03 MED ORDER — DEXTROSE 5 % IV SOLN
1.0000 g | INTRAVENOUS | Status: DC
  Administered 2014-01-03 – 2014-01-04 (×2): 1 g via INTRAVENOUS
  Filled 2014-01-03 (×3): qty 10

## 2014-01-03 MED ORDER — ATORVASTATIN CALCIUM 40 MG PO TABS
40.0000 mg | ORAL_TABLET | Freq: Every day | ORAL | Status: DC
  Administered 2014-01-04 – 2014-01-05 (×2): 40 mg via ORAL
  Filled 2014-01-03 (×2): qty 1

## 2014-01-03 NOTE — Assessment & Plan Note (Signed)
Bilateral community-acquired pneumonia with associated hemoptysis Plan Admit to hospital for further inpatient care

## 2014-01-03 NOTE — Telephone Encounter (Signed)
Per Crystal send this message to her and she will have to look at the schedule

## 2014-01-03 NOTE — Progress Notes (Signed)
Patient arrived to 1405 as a direct admit, patient oriented to room and has no complaints at this time. Patient with insulin pump and has signed contract for insulin pump therapy. Per MD instructions: "Document patient insulin dose at scheduled CBG's in the Comment field on the [facility] and not in the Dose field." J.Merlene Morse, RN

## 2014-01-03 NOTE — Progress Notes (Signed)
Subjective:    Patient ID: Jon [name], male    DOB: 1950-09-14, 63 y.o.   MRN: [id number]  HPI  01/03/2014 Chief Complaint  Patient presents with  . Acute Visit    with cxr - hemoptysis with dark blood x 2 days.  Temp 100.1 yesterday. Some chest tightness and pain under both arms x 2-3 days.  No increased SOB.  Pt had FOB past week, now worse with dyspnea, fever, cough. Pt having pain in both arms , still with fever.  Now off xarelto.  Off lisinopril Blood is bright red and part is dark.  Pt will expectorate a large amount of mucus.  CBGs 120-195.  Insulin pump is at 0.5units/hr    Review of Systems  Constitutional:   No  weight loss, night sweats,  +++Fevers, chills,+++ fatigue,+++ lassitude. HEENT:   No headaches,  Difficulty swallowing,  Tooth/dental problems,  Sore throat,                No sneezing, itching, ear ache, nasal congestion, post nasal drip,   CV:  No chest pain,  Orthopnea, PND, swelling in lower extremities, anasarca, dizziness, palpitations  GI  No heartburn, indigestion, abdominal pain, nausea, vomiting, diarrhea, change in bowel habits, loss of appetite  Resp: +++ shortness of breath with exertion not at rest.  No excess mucus, notes productive cough,  No non-productive cough,  +++coughing up of blood.  No change in color of mucus.  No wheezing.  No chest wall deformity  Skin: no rash or lesions.  GU: no dysuria, change in color of urine, no urgency or frequency.  No flank pain.  MS:  No joint pain or swelling.  No decreased range of motion.  No back pain.  Psych:  No change in mood or affect. No depression or anxiety.  No memory loss.     Objective:   Physical Exam  Filed Vitals:   01/03/14 1136  BP: 106/62  Pulse: 83  Temp: 99.1 F (37.3 C)  TempSrc: Oral  Height: 5\' 7"  (1.702 m)  Weight: 260 lb 9.6 oz (118.207 kg)  SpO2: 95%    Gen: Pleasant, obese, in no distress,  normal affect  ENT: No lesions,  mouth clear,  oropharynx clear, no  postnasal drip  Neck: No JVD, no TMG, no carotid bruits  Lungs: No use of accessory muscles, no dullness to percussion, distant bs, bilateral rhonchi  Cardiovascular: RRR, heart sounds normal, no murmur or gallops, no peripheral edema  Abdomen: soft and NT, no HSM,  BS normal insulin pump in place   Musculoskeletal: No deformities, no cyanosis or clubbing  Neuro: alert, non focal  Skin: Warm, no lesions or rashes  No results found. CXR: Bilateral infiltrates which are new on 01/03/14       Assessment & Plan:   CAP (community acquired pneumonia) Bilateral community-acquired pneumonia with associated hemoptysis Plan Admit to hospital for further inpatient care    Updated Medication List Outpatient Encounter Prescriptions as of 01/03/2014  Medication Sig  . albuterol (PROVENTIL HFA;VENTOLIN HFA) 108 (90 BASE) MCG/ACT inhaler Inhale 2 puffs into the lungs every 6 (six) hours as needed for wheezing or shortness of breath.  Marland Kitchen amitriptyline (ELAVIL) 25 MG tablet Take 25 mg by mouth at bedtime.  Marland Kitchen atorvastatin (LIPITOR) 40 MG tablet Take 40 mg by mouth daily.  Marland Kitchen azelastine (ASTELIN) 137 MCG/SPRAY nasal spray Place 1 spray into both nostrils at bedtime. Use in each nostril as directed  .  Calcium Carbonate-Vitamin D (CALTRATE 600+D PO) Take 2 tablets by mouth 2 (two) times daily.   . clarithromycin (BIAXIN) 500 MG tablet Take 500 mg by mouth 2 (two) times daily.  Marland Kitchen diltiazem (CARDIZEM) 30 MG tablet Take 30 mg by mouth 2 (two) times daily.  . fentaNYL (DURAGESIC - DOSED MCG/HR) 75 MCG/HR Place 1 patch onto the skin every 3 (three) days.  . fluticasone (FLONASE) 50 MCG/ACT nasal spray Place 1 spray into both nostrils daily.  . Fluticasone-Salmeterol (ADVAIR) 500-50 MCG/DOSE AEPB Inhale 1 puff into the lungs every 12 (twelve) hours.  . hydrALAZINE (APRESOLINE) 50 MG tablet Take 50 mg by mouth 4 (four) times daily.  Marland Kitchen HYDROcodone-homatropine (HYCODAN) 5-1.5 MG/5ML syrup Take 5 mLs by mouth  every 6 (six) hours as needed for cough.  . Insulin Human (INSULIN PUMP) 100 unit/ml SOLN Inject into the skin continuous. novolog  . ipratropium-albuterol (DUONEB) 0.5-2.5 (3) MG/3ML SOLN Take 3 mLs by nebulization every 6 (six) hours as needed. For shortness of breath  . isosorbide mononitrate (IMDUR) 60 MG 24 hr tablet Take 60 mg by mouth every morning.  . latanoprost (XALATAN) 0.005 % ophthalmic solution Place 1 drop into both eyes at bedtime.  . magnesium oxide (MAG-OX) 400 MG tablet Take 1,600 mg by mouth daily.   . metolazone (ZAROXOLYN) 2.5 MG tablet Take 2.5 mg by mouth. On M,W,F 30 minutes before furosemide.  . Multiple Vitamins-Minerals (CENTRUM SILVER PO) Take 1 tablet by mouth daily.  . nitroGLYCERIN (NITROSTAT) 0.4 MG SL tablet Place 0.4 mg under the tongue every 5 (five) minutes as needed. For chest pain  . NOVOLOG 100 UNIT/ML injection As directed via insulin pump  . oxyCODONE-acetaminophen (PERCOCET) 5-325 MG per tablet Take 1 tablet by mouth 4 (four) times daily as needed. For severe pain  . pantoprazole (PROTONIX) 40 MG tablet Take 40 mg by mouth 2 (two) times daily.  Vladimir Faster Glycol-Propyl Glycol (SYSTANE PRESERVATIVE FREE) 0.4-0.3 % SOLN Apply 1 drop to eye 4 (four) times daily as needed (dryness of eye).   . potassium chloride SA (K-DUR,KLOR-CON) 20 MEQ tablet Take 20 mEq by mouth 3 (three) times daily.  . pramipexole (MIRAPEX) 1.5 MG tablet Take 1.5 mg by mouth at bedtime.  . pregabalin (LYRICA) 300 MG capsule Take 300 mg by mouth 2 (two) times daily.  Marland Kitchen RELISTOR 12 MG/0.6ML SOLN injection Inject 12 mg into the skin as needed (constipation).   . torsemide (DEMADEX) 20 MG tablet Take 20 mg by mouth 2 (two) times daily.  Marland Kitchen triamcinolone (NASACORT) 55 MCG/ACT nasal inhaler Place 2 sprays into the nose daily.  Marland Kitchen zolpidem (AMBIEN) 10 MG tablet Take 10 mg by mouth at bedtime.  Marland Kitchen losartan (COZAAR) 50 MG tablet Take 1 tablet (50 mg total) by mouth daily.  . [DISCONTINUED]  lubiprostone (AMITIZA) 24 MCG capsule Take 24 mcg by mouth daily with breakfast.

## 2014-01-03 NOTE — H&P (Signed)
Name: Jon [name] MRN: [id number] DOB: [birth year]    ADMISSION DATE:  01/03/2014 CONSULTATION DATE:  01/03/2014   PRIMARY SERVICE:  PWright  CHIEF COMPLAINT:   Hemoptysis, fever  BRIEF PATIENT DESCRIPTION:  63 y.o.M with CAP, hemoptysis, neg FOB on 4/30, more dyspnea adm with bilateral infiltrates on CXR.   Pt with DM, neuropathy, on insulin pump, s/p bariatric surgery, OSA on cpap, RLS, HTN, HL, GERD, CAF on xarelto.  Pt adm with ongoing hemoptysis,  Bilateral pulm infiltrates and fever  SIGNIFICANT EVENTS / STUDIES:  CT Chest 01/03/2014   LINES / TUBES: PI  CULTURES: Resp cult 01/03/2014 BC x 2 01/03/2014   ANTIBIOTICS: Rocephin 01/03/2014 Azithromycin 01/03/2014   HISTORY OF PRESENT ILLNESS:   63 y.o.M with CAP, hemoptysis, neg FOB on 4/30, more dyspnea adm with bilateral infiltrates on CXR.   Pt with DM, neuropathy, on insulin pump, s/p bariatric surgery, OSA on cpap, RLS, HTN, HL, GERD, CAF on xarelto.  Pt adm with ongoing hemoptysis,  Bilateral pulm infiltrates and fever  Pt denies any significant sore throat, nasal congestion or excess secretions,  unintended weight loss, pleurtic or exertional chest pain, orthopnea PND, or leg swelling Pt denies any increase in rescue therapy over baseline, denies waking up needing it or having any early am or nocturnal exacerbations of coughing/wheezing/or dyspnea. Pt also denies any obvious fluctuation in symptoms with  weather or environmental change or other alleviating or aggravating factors    PAST MEDICAL HISTORY :  Past Medical History  Diagnosis Date  . Hypothyroidism   . Dysrhythmia     atrial fib takes cardizem,   . Hypertension   . Angina   . Stroke 2012  . Restless leg syndrome   . GERD (gastroesophageal reflux disease)   . Sleep apnea     cpap & oxygen  . Diabetes mellitus     insulin pump   . Pneumonia     hx of  . Hyperlipemia   . H/O hiatal hernia   . Neuromuscular disorder     rls, neuropathy  .  Arthritis     right knee  . Obesity (BMI 30-39.9)    Past Surgical History  Procedure Laterality Date  . Hernia repair    . Eye surgery    . Sinus exploration    . Appendectomy    . Cholecystectomy    . Cardiac catheterization  2007  . Laparoscopic gastric banding  2010  . Video bronchoscopy Bilateral 12/29/2013    Procedure: VIDEO BRONCHOSCOPY WITHOUT FLUORO;  Surgeon: Elsie Stain, MD;  Location: [facility];  Service: Endoscopy;  Laterality: Bilateral;   Prior to Admission medications   Medication Sig Start Date End Date Taking? Authorizing Provider  albuterol (PROVENTIL HFA;VENTOLIN HFA) 108 (90 BASE) MCG/ACT inhaler Inhale 2 puffs into the lungs every 6 (six) hours as needed for wheezing or shortness of breath.    Historical Provider, MD  amitriptyline (ELAVIL) 25 MG tablet Take 25 mg by mouth at bedtime.    Historical Provider, MD  atorvastatin (LIPITOR) 40 MG tablet Take 40 mg by mouth daily.    Historical Provider, MD  azelastine (ASTELIN) 137 MCG/SPRAY nasal spray Place 1 spray into both nostrils at bedtime. Use in each nostril as directed    Historical Provider, MD  Calcium Carbonate-Vitamin D (CALTRATE 600+D PO) Take 2 tablets by mouth 2 (two) times daily.     Historical Provider, MD  clarithromycin (BIAXIN) 500 MG tablet Take 500 mg by  mouth 2 (two) times daily.    Historical Provider, MD  diltiazem (CARDIZEM) 30 MG tablet Take 30 mg by mouth 2 (two) times daily.    Historical Provider, MD  fentaNYL (DURAGESIC - DOSED MCG/HR) 75 MCG/HR Place 1 patch onto the skin every 3 (three) days.    Historical Provider, MD  fluticasone (FLONASE) 50 MCG/ACT nasal spray Place 1 spray into both nostrils daily.    Historical Provider, MD  Fluticasone-Salmeterol (ADVAIR) 500-50 MCG/DOSE AEPB Inhale 1 puff into the lungs every 12 (twelve) hours.    Historical Provider, MD  hydrALAZINE (APRESOLINE) 50 MG tablet Take 50 mg by mouth 4 (four) times daily.    Historical Provider, MD    HYDROcodone-homatropine (HYCODAN) 5-1.5 MG/5ML syrup Take 5 mLs by mouth every 6 (six) hours as needed for cough. 12/29/13   Elsie Stain, MD  Insulin Human (INSULIN PUMP) 100 unit/ml SOLN Inject into the skin continuous. novolog    Historical Provider, MD  ipratropium-albuterol (DUONEB) 0.5-2.5 (3) MG/3ML SOLN Take 3 mLs by nebulization every 6 (six) hours as needed. For shortness of breath    Historical Provider, MD  isosorbide mononitrate (IMDUR) 60 MG 24 hr tablet Take 60 mg by mouth every morning.    Historical Provider, MD  latanoprost (XALATAN) 0.005 % ophthalmic solution Place 1 drop into both eyes at bedtime.    Historical Provider, MD  losartan (COZAAR) 50 MG tablet Take 1 tablet (50 mg total) by mouth daily. 01/02/14   Elsie Stain, MD  magnesium oxide (MAG-OX) 400 MG tablet Take 1,600 mg by mouth daily.     Historical Provider, MD  metolazone (ZAROXOLYN) 2.5 MG tablet Take 2.5 mg by mouth. On M,W,F 30 minutes before furosemide.    Historical Provider, MD  Multiple Vitamins-Minerals (CENTRUM SILVER PO) Take 1 tablet by mouth daily.    Historical Provider, MD  nitroGLYCERIN (NITROSTAT) 0.4 MG SL tablet Place 0.4 mg under the tongue every 5 (five) minutes as needed. For chest pain    Historical Provider, MD  NOVOLOG 100 UNIT/ML injection As directed via insulin pump    Historical Provider, MD  oxyCODONE-acetaminophen (PERCOCET) 5-325 MG per tablet Take 1 tablet by mouth 4 (four) times daily as needed. For severe pain    Historical Provider, MD  pantoprazole (PROTONIX) 40 MG tablet Take 40 mg by mouth 2 (two) times daily.    Historical Provider, MD  Polyethyl Glycol-Propyl Glycol (SYSTANE PRESERVATIVE FREE) 0.4-0.3 % SOLN Apply 1 drop to eye 4 (four) times daily as needed.    Historical Provider, MD  potassium chloride SA (K-DUR,KLOR-CON) 20 MEQ tablet Take 20 mEq by mouth 3 (three) times daily.    Historical Provider, MD  pramipexole (MIRAPEX) 1.5 MG tablet Take 1.5 mg by mouth at  bedtime.    Historical Provider, MD  pregabalin (LYRICA) 300 MG capsule Take 300 mg by mouth 2 (two) times daily.    Historical Provider, MD  RELISTOR 12 MG/0.6ML SOLN injection as needed.    Historical Provider, MD  torsemide (DEMADEX) 20 MG tablet Take 20 mg by mouth 2 (two) times daily.    Historical Provider, MD  triamcinolone (NASACORT) 55 MCG/ACT nasal inhaler Place 2 sprays into the nose daily.    Historical Provider, MD  zolpidem (AMBIEN) 10 MG tablet Take 10 mg by mouth at bedtime.    Historical Provider, MD   Allergies  Allergen Reactions  . Codeine Itching  . Penicillins Rash    FAMILY HISTORY:  Family History  Problem Relation Age of Onset  . Anesthesia problems Neg Hx   . Hypotension Neg Hx   . Malignant hyperthermia Neg Hx   . Pseudochol deficiency Neg Hx   . Cancer Mother    SOCIAL HISTORY:  reports that he quit smoking about 7 years ago. His smoking use included Cigars. He has never used smokeless tobacco. He reports that he does not drink alcohol. His drug history is not on file.  REVIEW OF SYSTEMS:   Positive in BOLD Constitutional: Negative for fever, chills, weight loss, malaise/fatigue and diaphoresis.  HENT: Negative for hearing loss, ear pain, nosebleeds, congestion, sore throat, neck pain, tinnitus and ear discharge.   Eyes: Negative for blurred vision, double vision, photophobia, pain, discharge and redness.  Respiratory: cough, hemoptysis, sputum production, shortness of breath, wheezing and stridor.   Cardiovascular: Negative for chest pain, palpitations, orthopnea, claudication, leg swelling and PND.  Gastrointestinal: Negative for heartburn, nausea, vomiting, abdominal pain, diarrhea, constipation, blood in stool and melena.  Genitourinary: Negative for dysuria, urgency, frequency, hematuria and flank pain.  Musculoskeletal: Negative for myalgias, back pain, joint pain and falls.  Skin: Negative for itching and rash.  Neurological: Negative for  dizziness, tingling, tremors, sensory change, speech change, focal weakness, seizures, loss of consciousness, weakness and headaches.  Endo/Heme/Allergies: Negative for environmental allergies and polydipsia. Does not bruise/bleed easily.  SUBJECTIVE:    VITAL SIGNS: Temp:  [99.1 F (37.3 C)] 99.1 F (37.3 C) (05/05 1136) Pulse Rate:  [83] 83 (05/05 1136) BP: (106)/(62) 106/62 mmHg (05/05 1136) SpO2:  [95 %] 95 % (05/05 1136) Weight:  [260 lb 9.6 oz (118.207 kg)] 260 lb 9.6 oz (118.207 kg) (05/05 1136)  PHYSICAL EXAMINATION: General:  Ill appearing WM in NAD Neuro:  No focal deficits HEENT:  Mild JVD, no TMG  orophyrnx clear Neck:  Supple  Cardiovascular:  irreg irreg nl s1 s2 no s3 s4  Lungs:  Rhonchi, rales both LLs Abdomen:  Soft NT Musculoskeletal:  From, no joint deformity Skin:  clear  No results found for this basename: NA, K, CL, CO2, BUN, CREATININE, GLUCOSE,  in the last 168 hours No results found for this basename: HGB, HCT, WBC, PLT,  in the last 168 hours No results found.  ASSESSMENT / PLAN: #1.  Bilateral LL CAP with hemoptysis , on Xarelto.  Neg FOB    Plan   Rx Rocephin/azithromycin   IM medrol   BD neb meds   Oxygen   CT Chest   Stop xarelto  #2 DM 2 with neuro complications  Plan   Cont insulin pump   SSI   CBGs tid AC and HS  #3 neuropathy  Plan   Pain control   Reduce lyrica dose     Jon [name]  [phone]  Cell  [phone]  If no response or cell goes to voicemail, call beeper [phone]  Pulmonary and [place] Pager: [phone]  01/03/2014, 1:22 PM

## 2014-01-03 NOTE — Telephone Encounter (Signed)
I spoke with pt.  He will go to [facility] today at 10:30 am for cxr then come to [facility] office afterwards to see PW.  CXR order has been faxed to [facility] as STAT.   Pt aware.

## 2014-01-03 NOTE — Patient Instructions (Signed)
You will be admitted to the hospital at [facility] Try to arrive by 5pm Stay off [place] for now

## 2014-01-03 NOTE — Progress Notes (Signed)
Asked patient about settings on insulin pump and if pump would tell him how much to bolus based on carbs and glucose reading, patient stated he "guesses" at the amount based on what his readings are and his pump does not have that function. RN will use a resistant sliding scale for meal coverage as orders state in [facility] and administer insulin. Patient will keep current basal rate settings on insulin pump. RN educated patient on plan of care and safety of correct dosage insulin administration.  Patient verbalized understanding of RN administration of insulin. Will continue to monitor. Jon Schuman, RN

## 2014-01-04 DIAGNOSIS — J189 Pneumonia, unspecified organism: Secondary | ICD-10-CM | POA: Diagnosis not present

## 2014-01-04 DIAGNOSIS — I319 Disease of pericardium, unspecified: Secondary | ICD-10-CM

## 2014-01-04 DIAGNOSIS — I3139 Other pericardial effusion (noninflammatory): Secondary | ICD-10-CM | POA: Diagnosis present

## 2014-01-04 DIAGNOSIS — I4891 Unspecified atrial fibrillation: Secondary | ICD-10-CM | POA: Diagnosis not present

## 2014-01-04 DIAGNOSIS — R042 Hemoptysis: Secondary | ICD-10-CM | POA: Diagnosis not present

## 2014-01-04 DIAGNOSIS — R609 Edema, unspecified: Secondary | ICD-10-CM

## 2014-01-04 DIAGNOSIS — I313 Pericardial effusion (noninflammatory): Secondary | ICD-10-CM | POA: Diagnosis present

## 2014-01-04 LAB — CBC WITH DIFFERENTIAL/PLATELET
BASOS PCT: 0 % (ref 0–1)
Basophils Absolute: 0 10*3/uL (ref 0.0–0.1)
Eosinophils Absolute: 0 10*3/uL (ref 0.0–0.7)
Eosinophils Relative: 0 % (ref 0–5)
HEMATOCRIT: 42.5 % (ref 39.0–52.0)
Hemoglobin: 14.1 g/dL (ref 13.0–17.0)
Lymphocytes Relative: 5 % — ABNORMAL LOW (ref 12–46)
Lymphs Abs: 0.4 10*3/uL — ABNORMAL LOW (ref 0.7–4.0)
MCH: 27.8 pg (ref 26.0–34.0)
MCHC: 33.2 g/dL (ref 30.0–36.0)
MCV: 83.8 fL (ref 78.0–100.0)
Monocytes Absolute: 0.1 10*3/uL (ref 0.1–1.0)
Monocytes Relative: 1 % — ABNORMAL LOW (ref 3–12)
NEUTROS PCT: 94 % — AB (ref 43–77)
Neutro Abs: 7 10*3/uL (ref 1.7–7.7)
PLATELETS: 146 10*3/uL — AB (ref 150–400)
RBC: 5.07 MIL/uL (ref 4.22–5.81)
RDW: 14.2 % (ref 11.5–15.5)
WBC: 7.5 10*3/uL (ref 4.0–10.5)

## 2014-01-04 LAB — BASIC METABOLIC PANEL
BUN: 22 mg/dL (ref 6–23)
CO2: 22 mEq/L (ref 19–32)
CREATININE: 1.12 mg/dL (ref 0.50–1.35)
Calcium: 9.2 mg/dL (ref 8.4–10.5)
Chloride: 101 mEq/L (ref 96–112)
GFR, EST AFRICAN AMERICAN: 78 mL/min — AB (ref >90)
GFR, EST NON AFRICAN AMERICAN: 67 mL/min — AB (ref >90)
Glucose, Bld: 214 mg/dL — ABNORMAL HIGH (ref 70–99)
Potassium: 4.9 mEq/L (ref 3.7–5.3)
Sodium: 136 mEq/L — ABNORMAL LOW (ref 137–147)

## 2014-01-04 LAB — EXPECTORATED SPUTUM ASSESSMENT W REFEX TO RESP CULTURE: SPECIAL REQUESTS: NORMAL

## 2014-01-04 LAB — EXPECTORATED SPUTUM ASSESSMENT W GRAM STAIN, RFLX TO RESP C

## 2014-01-04 LAB — GLUCOSE, CAPILLARY
GLUCOSE-CAPILLARY: 193 mg/dL — AB (ref 70–99)
Glucose-Capillary: 311 mg/dL — ABNORMAL HIGH (ref 70–99)
Glucose-Capillary: 231 mg/dL — ABNORMAL HIGH (ref 70–99)
Glucose-Capillary: 242 mg/dL — ABNORMAL HIGH (ref 70–99)

## 2014-01-04 MED ORDER — POTASSIUM CHLORIDE CRYS ER 20 MEQ PO TBCR
20.0000 meq | EXTENDED_RELEASE_TABLET | Freq: Two times a day (BID) | ORAL | Status: DC
  Administered 2014-01-04 – 2014-01-05 (×3): 20 meq via ORAL
  Filled 2014-01-04 (×4): qty 1

## 2014-01-04 MED ORDER — INSULIN PUMP
Freq: Three times a day (TID) | SUBCUTANEOUS | Status: DC
  Administered 2014-01-04: 9.8 via SUBCUTANEOUS
  Administered 2014-01-04: 18:00:00 via SUBCUTANEOUS
  Administered 2014-01-04: 13 via SUBCUTANEOUS
  Administered 2014-01-05: 7.6 via SUBCUTANEOUS
  Administered 2014-01-05: 13:00:00 via SUBCUTANEOUS
  Filled 2014-01-04: qty 1

## 2014-01-04 NOTE — Consult Note (Signed)
CONSULTATION NOTE  Reason for Consult: Pericardial effusion  Requesting Physician: Dr. [name]  Cardiologist: Dr. Bettina Gavia [facility] cardiology)  HPI: This is a 63 y.o. male with a past medical history significant for IDDM with neuropathy, OSA on CPAP, prior stroke, COPD, HTN, hyperlipidemia, GERD, and morbid obesity.  He was admitted yesterday for hemoptysis and fever - he has a diagnosis of CAP, but was found to have a moderate-sized pericardial effusion on chest CT (effusion is water density by Hounsfield units).  He underwent bronchoscopy on 12/29/13 which did not show any endobronchial lesions. He does have multi-lobar airspace disease. There is a chest CT in the system from 2011, which showed a small circumferential effusion at that time.  Echocardiogram was performed today and is as follows:  Left ventricle: E/e'>10 suggestive of elevated filling pressures The cavity size was normal. There was mild focal basal hypertrophy of the septum. Systolic function was normal. The estimated ejection fraction was in the range of 55% to 60%. Wall motion was normal; there were no regional wall motion abnormalities. - Aortic valve: Trivial regurgitation. - Left atrium: The atrium was moderately dilated. - Right atrium: The atrium was mildly dilated. - Pulmonary arteries: PA peak pressure: 83m Hg (S). - Inferior vena cava: The vessel was dilated; the respirophasic diameter changes were blunted (< 50%); findings are consistent with elevated central venous pressure. - Pericardium, extracardiac: The pericardial effusion measures 2.8cm at greatest diameter. A moderate, free-flowing pericardial effusion was identified circumferential to the heart. The fluid had no internal echoes.There was no evidence of hemodynamic compromise.  Impressions:  - The right ventricular systolic pressure was increased consistent with mild pulmonary hypertension.  He denies chest pain, but does feel some  tightness in the chest. We are asked to provide recommendations regarding management of his chronic pericardial effusion.  PMHx:  Past Medical History  Diagnosis Date  . Hypothyroidism   . Dysrhythmia     atrial fib takes cardizem,   . Hypertension   . Angina   . Stroke 2012  . Restless leg syndrome   . GERD (gastroesophageal reflux disease)   . Sleep apnea     cpap & oxygen  . Diabetes mellitus     insulin pump   . Pneumonia     hx of  . Hyperlipemia   . H/O hiatal hernia   . Neuromuscular disorder     rls, neuropathy  . Arthritis     right knee  . Obesity (BMI 30-39.9)    Past Surgical History  Procedure Laterality Date  . Hernia repair    . Eye surgery    . Sinus exploration    . Appendectomy    . Cholecystectomy    . Cardiac catheterization  2007  . Laparoscopic gastric banding  2010  . Video bronchoscopy Bilateral 12/29/2013    Procedure: VIDEO BRONCHOSCOPY WITHOUT FLUORO;  Surgeon: PElsie Stain MD;  Location: [facility];  Service: Endoscopy;  Laterality: Bilateral;    FAMHx: Family History  Problem Relation Age of Onset  . Anesthesia problems Neg Hx   . Hypotension Neg Hx   . Malignant hyperthermia Neg Hx   . Pseudochol deficiency Neg Hx   . Cancer Mother     SOCHx:  reports that he quit smoking about 7 years ago. His smoking use included Cigars. He has never used smokeless tobacco. He reports that he does not drink alcohol. His drug history is not on file.  ALLERGIES: Allergies  Allergen Reactions  .  Codeine Itching  . Penicillins Rash    ROS: A comprehensive review of systems was negative except for: Constitutional: positive for fevers and malaise Respiratory: positive for cough and sputum Cardiovascular: positive for chest pressure/discomfort  HOME MEDICATIONS: Prescriptions prior to admission  Medication Sig Dispense Refill  . albuterol (PROVENTIL HFA;VENTOLIN HFA) 108 (90 BASE) MCG/ACT inhaler Inhale 2 puffs into the lungs every 6  (six) hours as needed for wheezing or shortness of breath.      Marland Kitchen amitriptyline (ELAVIL) 25 MG tablet Take 25 mg by mouth at bedtime.      Marland Kitchen atorvastatin (LIPITOR) 40 MG tablet Take 40 mg by mouth daily.      Marland Kitchen azelastine (ASTELIN) 137 MCG/SPRAY nasal spray Place 1 spray into both nostrils at bedtime. Use in each nostril as directed      . Calcium Carbonate-Vitamin D (CALTRATE 600+D PO) Take 2 tablets by mouth 2 (two) times daily.       . clarithromycin (BIAXIN) 500 MG tablet Take 500 mg by mouth 2 (two) times daily.      Marland Kitchen diltiazem (CARDIZEM) 30 MG tablet Take 30 mg by mouth 2 (two) times daily.      . fentaNYL (DURAGESIC - DOSED MCG/HR) 75 MCG/HR Place 1 patch onto the skin every 3 (three) days.      . fluticasone (FLONASE) 50 MCG/ACT nasal spray Place 1 spray into both nostrils daily.      . Fluticasone-Salmeterol (ADVAIR) 500-50 MCG/DOSE AEPB Inhale 1 puff into the lungs every 12 (twelve) hours.      . hydrALAZINE (APRESOLINE) 50 MG tablet Take 50 mg by mouth 4 (four) times daily.      Marland Kitchen HYDROcodone-homatropine (HYCODAN) 5-1.5 MG/5ML syrup Take 5 mLs by mouth every 6 (six) hours as needed for cough.  120 mL  0  . ipratropium-albuterol (DUONEB) 0.5-2.5 (3) MG/3ML SOLN Take 3 mLs by nebulization every 6 (six) hours as needed. For shortness of breath      . isosorbide mononitrate (IMDUR) 60 MG 24 hr tablet Take 60 mg by mouth every morning.      . latanoprost (XALATAN) 0.005 % ophthalmic solution Place 1 drop into both eyes at bedtime.      Marland Kitchen losartan (COZAAR) 50 MG tablet Take 1 tablet (50 mg total) by mouth daily.  30 tablet  11  . magnesium oxide (MAG-OX) 400 MG tablet Take 1,600 mg by mouth daily.       . metolazone (ZAROXOLYN) 2.5 MG tablet Take 2.5 mg by mouth. On M,W,F 30 minutes before furosemide.      . Multiple Vitamins-Minerals (CENTRUM SILVER PO) Take 1 tablet by mouth daily.      Marland Kitchen oxyCODONE-acetaminophen (PERCOCET) 5-325 MG per tablet Take 1 tablet by mouth 4 (four) times daily as  needed. For severe pain      . pantoprazole (PROTONIX) 40 MG tablet Take 40 mg by mouth 2 (two) times daily.      Vladimir Faster Glycol-Propyl Glycol (SYSTANE PRESERVATIVE FREE) 0.4-0.3 % SOLN Apply 1 drop to eye 4 (four) times daily as needed (dryness of eye).       . potassium chloride SA (K-DUR,KLOR-CON) 20 MEQ tablet Take 20 mEq by mouth 3 (three) times daily.      . pramipexole (MIRAPEX) 1.5 MG tablet Take 1.5 mg by mouth at bedtime.      . pregabalin (LYRICA) 300 MG capsule Take 300 mg by mouth 2 (two) times daily.      Marland Kitchen RELISTOR  12 MG/0.6ML SOLN injection Inject 12 mg into the skin as needed (constipation).       . torsemide (DEMADEX) 20 MG tablet Take 20 mg by mouth 2 (two) times daily.      Marland Kitchen triamcinolone (NASACORT) 55 MCG/ACT nasal inhaler Place 2 sprays into the nose daily.      Marland Kitchen zolpidem (AMBIEN) 10 MG tablet Take 10 mg by mouth at bedtime.      . Insulin Human (INSULIN PUMP) 100 unit/ml SOLN Inject into the skin continuous. novolog      . nitroGLYCERIN (NITROSTAT) 0.4 MG SL tablet Place 0.4 mg under the tongue every 5 (five) minutes as needed. For chest pain      . NOVOLOG 100 UNIT/ML injection As directed via insulin pump        HOSPITAL MEDICATIONS: Prior to Admission:  Prescriptions prior to admission  Medication Sig Dispense Refill  . albuterol (PROVENTIL HFA;VENTOLIN HFA) 108 (90 BASE) MCG/ACT inhaler Inhale 2 puffs into the lungs every 6 (six) hours as needed for wheezing or shortness of breath.      Marland Kitchen amitriptyline (ELAVIL) 25 MG tablet Take 25 mg by mouth at bedtime.      Marland Kitchen atorvastatin (LIPITOR) 40 MG tablet Take 40 mg by mouth daily.      Marland Kitchen azelastine (ASTELIN) 137 MCG/SPRAY nasal spray Place 1 spray into both nostrils at bedtime. Use in each nostril as directed      . Calcium Carbonate-Vitamin D (CALTRATE 600+D PO) Take 2 tablets by mouth 2 (two) times daily.       . clarithromycin (BIAXIN) 500 MG tablet Take 500 mg by mouth 2 (two) times daily.      Marland Kitchen diltiazem  (CARDIZEM) 30 MG tablet Take 30 mg by mouth 2 (two) times daily.      . fentaNYL (DURAGESIC - DOSED MCG/HR) 75 MCG/HR Place 1 patch onto the skin every 3 (three) days.      . fluticasone (FLONASE) 50 MCG/ACT nasal spray Place 1 spray into both nostrils daily.      . Fluticasone-Salmeterol (ADVAIR) 500-50 MCG/DOSE AEPB Inhale 1 puff into the lungs every 12 (twelve) hours.      . hydrALAZINE (APRESOLINE) 50 MG tablet Take 50 mg by mouth 4 (four) times daily.      Marland Kitchen HYDROcodone-homatropine (HYCODAN) 5-1.5 MG/5ML syrup Take 5 mLs by mouth every 6 (six) hours as needed for cough.  120 mL  0  . ipratropium-albuterol (DUONEB) 0.5-2.5 (3) MG/3ML SOLN Take 3 mLs by nebulization every 6 (six) hours as needed. For shortness of breath      . isosorbide mononitrate (IMDUR) 60 MG 24 hr tablet Take 60 mg by mouth every morning.      . latanoprost (XALATAN) 0.005 % ophthalmic solution Place 1 drop into both eyes at bedtime.      Marland Kitchen losartan (COZAAR) 50 MG tablet Take 1 tablet (50 mg total) by mouth daily.  30 tablet  11  . magnesium oxide (MAG-OX) 400 MG tablet Take 1,600 mg by mouth daily.       . metolazone (ZAROXOLYN) 2.5 MG tablet Take 2.5 mg by mouth. On M,W,F 30 minutes before furosemide.      . Multiple Vitamins-Minerals (CENTRUM SILVER PO) Take 1 tablet by mouth daily.      Marland Kitchen oxyCODONE-acetaminophen (PERCOCET) 5-325 MG per tablet Take 1 tablet by mouth 4 (four) times daily as needed. For severe pain      . pantoprazole (PROTONIX) 40 MG tablet Take  40 mg by mouth 2 (two) times daily.      Vladimir Faster Glycol-Propyl Glycol (SYSTANE PRESERVATIVE FREE) 0.4-0.3 % SOLN Apply 1 drop to eye 4 (four) times daily as needed (dryness of eye).       . potassium chloride SA (K-DUR,KLOR-CON) 20 MEQ tablet Take 20 mEq by mouth 3 (three) times daily.      . pramipexole (MIRAPEX) 1.5 MG tablet Take 1.5 mg by mouth at bedtime.      . pregabalin (LYRICA) 300 MG capsule Take 300 mg by mouth 2 (two) times daily.      Marland Kitchen RELISTOR  12 MG/0.6ML SOLN injection Inject 12 mg into the skin as needed (constipation).       . torsemide (DEMADEX) 20 MG tablet Take 20 mg by mouth 2 (two) times daily.      Marland Kitchen triamcinolone (NASACORT) 55 MCG/ACT nasal inhaler Place 2 sprays into the nose daily.      Marland Kitchen zolpidem (AMBIEN) 10 MG tablet Take 10 mg by mouth at bedtime.      . Insulin Human (INSULIN PUMP) 100 unit/ml SOLN Inject into the skin continuous. novolog      . nitroGLYCERIN (NITROSTAT) 0.4 MG SL tablet Place 0.4 mg under the tongue every 5 (five) minutes as needed. For chest pain      . NOVOLOG 100 UNIT/ML injection As directed via insulin pump        VITALS: Blood pressure 138/73, pulse 72, temperature 97.6 F (36.4 C), temperature source Oral, resp. rate 18, height '5\' 7"'  (1.702 m), weight 259 lb 9.6 oz (117.754 kg), SpO2 92.00%.  PHYSICAL EXAM: General appearance: alert and no distress Neck: no carotid bruit and thick, cannot assess JVP Lungs: diminished breath sounds bilaterally Heart: irregularly irregular rhythm Abdomen: morbidly obese Extremities: edema trace Pulses: 2+ and symmetric Skin: Skin color, texture, turgor normal. No rashes or lesions Neurologic: Grossly normal Psych: Mood, affect normal  LABS: Results for orders placed during the hospital encounter of 01/03/14 (from the past 48 hour(s))  COMPREHENSIVE METABOLIC PANEL     Status: Abnormal   Collection Time    01/03/14  5:39 PM      Result Value Ref Range   Sodium 142  137 - 147 mEq/L   Potassium 4.9  3.7 - 5.3 mEq/L   Chloride 104  96 - 112 mEq/L   CO2 27  19 - 32 mEq/L   Glucose, Bld 66 (*) 70 - 99 mg/dL   BUN 22  6 - 23 mg/dL   Creatinine, Ser 1.23  0.50 - 1.35 mg/dL   Calcium 9.3  8.4 - 10.5 mg/dL   Total Protein 6.6  6.0 - 8.3 g/dL   Albumin 3.5  3.5 - 5.2 g/dL   AST 23  0 - 37 U/L   ALT 24  0 - 53 U/L   Alkaline Phosphatase 79  39 - 117 U/L   Total Bilirubin 1.8 (*) 0.3 - 1.2 mg/dL   GFR calc non Af Amer 60 (*) >90 mL/min   GFR calc Af  Amer 69 (*) >90 mL/min   Comment: (NOTE)     The eGFR has been calculated using the CKD EPI equation.     This calculation has not been validated in all clinical situations.     eGFR's persistently <90 mL/min signify possible Chronic Kidney     Disease.  CBC WITH DIFFERENTIAL     Status: None   Collection Time    01/03/14  5:39 PM  Result Value Ref Range   WBC 8.3  4.0 - 10.5 K/uL   RBC 5.14  4.22 - 5.81 MIL/uL   Hemoglobin 14.0  13.0 - 17.0 g/dL   HCT 43.5  39.0 - 52.0 %   MCV 84.6  78.0 - 100.0 fL   MCH 27.2  26.0 - 34.0 pg   MCHC 32.2  30.0 - 36.0 g/dL   RDW 14.1  11.5 - 15.5 %   Platelets 151  150 - 400 K/uL   Neutrophils Relative % 73  43 - 77 %   Neutro Abs 6.0  1.7 - 7.7 K/uL   Lymphocytes Relative 19  12 - 46 %   Lymphs Abs 1.6  0.7 - 4.0 K/uL   Monocytes Relative 8  3 - 12 %   Monocytes Absolute 0.7  0.1 - 1.0 K/uL   Eosinophils Relative 0  0 - 5 %   Eosinophils Absolute 0.0  0.0 - 0.7 K/uL   Basophils Relative 0  0 - 1 %   Basophils Absolute 0.0  0.0 - 0.1 K/uL  GLUCOSE, CAPILLARY     Status: None   Collection Time    01/03/14  6:57 PM      Result Value Ref Range   Glucose-Capillary 70  70 - 99 mg/dL   Comment 1 Documented in Chart     Comment 2 Notify RN    GLUCOSE, CAPILLARY     Status: Abnormal   Collection Time    01/03/14 10:07 PM      Result Value Ref Range   Glucose-Capillary 158 (*) 70 - 99 mg/dL   Comment 1 Notify RN     Comment 2 Documented in Chart    BASIC METABOLIC PANEL     Status: Abnormal   Collection Time    01/04/14  4:00 AM      Result Value Ref Range   Sodium 136 (*) 137 - 147 mEq/L   Potassium 4.9  3.7 - 5.3 mEq/L   Chloride 101  96 - 112 mEq/L   CO2 22  19 - 32 mEq/L   Glucose, Bld 214 (*) 70 - 99 mg/dL   BUN 22  6 - 23 mg/dL   Creatinine, Ser 1.12  0.50 - 1.35 mg/dL   Calcium 9.2  8.4 - 10.5 mg/dL   GFR calc non Af Amer 67 (*) >90 mL/min   GFR calc Af Amer 78 (*) >90 mL/min   Comment: (NOTE)     The eGFR has been  calculated using the CKD EPI equation.     This calculation has not been validated in all clinical situations.     eGFR's persistently <90 mL/min signify possible Chronic Kidney     Disease.  CBC WITH DIFFERENTIAL     Status: Abnormal   Collection Time    01/04/14  4:00 AM      Result Value Ref Range   WBC 7.5  4.0 - 10.5 K/uL   RBC 5.07  4.22 - 5.81 MIL/uL   Hemoglobin 14.1  13.0 - 17.0 g/dL   HCT 42.5  39.0 - 52.0 %   MCV 83.8  78.0 - 100.0 fL   MCH 27.8  26.0 - 34.0 pg   MCHC 33.2  30.0 - 36.0 g/dL   RDW 14.2  11.5 - 15.5 %   Platelets 146 (*) 150 - 400 K/uL   Neutrophils Relative % 94 (*) 43 - 77 %   Neutro Abs 7.0  1.7 -  7.7 K/uL   Lymphocytes Relative 5 (*) 12 - 46 %   Lymphs Abs 0.4 (*) 0.7 - 4.0 K/uL   Monocytes Relative 1 (*) 3 - 12 %   Monocytes Absolute 0.1  0.1 - 1.0 K/uL   Eosinophils Relative 0  0 - 5 %   Eosinophils Absolute 0.0  0.0 - 0.7 K/uL   Basophils Relative 0  0 - 1 %   Basophils Absolute 0.0  0.0 - 0.1 K/uL  GLUCOSE, CAPILLARY     Status: Abnormal   Collection Time    01/04/14  7:29 AM      Result Value Ref Range   Glucose-Capillary 193 (*) 70 - 99 mg/dL  CULTURE, EXPECTORATED SPUTUM-ASSESSMENT     Status: None   Collection Time    01/04/14  7:30 AM      Result Value Ref Range   Specimen Description SPUTUM     Special Requests Normal     Sputum evaluation       Value: THIS SPECIMEN IS ACCEPTABLE. RESPIRATORY CULTURE REPORT TO FOLLOW.   Report Status 01/04/2014 FINAL      IMAGING: Ct Chest Wo Contrast  01/03/2014   CLINICAL DATA:  Pneumonia and hemoptysis  EXAM: CT CHEST WITHOUT CONTRAST  TECHNIQUE: Multidetector CT imaging of the chest was performed following the standard protocol without IV contrast.  COMPARISON:  02/23/2010  FINDINGS: THORACIC INLET/BODY WALL:  No acute abnormality.  MEDIASTINUM:  Moderate volume pericardial effusion, measuring up to 2.6 cm in thickness. The fluid is water density. No mediastinal fat infiltration. Cardiac size  within normal limits. Aortic atherosclerosis, without the evidence of acute arterial abnormality. No suspicious lymphadenopathy. Diffuse, mild circumferential esophageal thickening, likely stasis or reflux in the setting of lap band.  LUNG WINDOWS:  Nodular airspace disease throughout the bilateral lungs, most dense in the left upper lobe and right middle lobe. Given history of fever and cough this is consistent with pneumonia. In the setting of hemoptysis, some the opacity could represent hemorrhage. Besides the pneumonia, no other source of hemoptysis identified. No effusion, cavitation, or pneumothorax. No pulmonary edema.  UPPER ABDOMEN:  Unremarkable appearance of lap band.  OSSEOUS:  No acute fracture.  No suspicious lytic or blastic lesions.  IMPRESSION: 1. Multi lobar airspace disease which could represent pneumonia or alveolar hemorrhage (given history of fever and hemoptysis). 2. Moderate volume pericardial effusion, water density. 3. Similar findings were noted on chest CT in 2011.   Electronically Signed   By: Jorje Guild M.D.   On: 01/03/2014 23:41   EKG: None recorded to review  HOSPITAL DIAGNOSES: Principal Problem:   CAP (community acquired pneumonia) Active Problems:   Hemoptysis   Obstructive chronic bronchitis with exacerbation   Type II or unspecified type diabetes mellitus with neurological manifestations, not stated as uncontrolled   Atrial fibrillation   Pericardial effusion   IMPRESSION: 1. Moderate-sized, chronic pericardial effusion 2. Hemoptysis 3. Multi-lobar pneumonia 4. Chronic a-fib (xarelto being held) - CHADSVASC score of 6, HAS-BLED 3  RECOMMENDATION: 1. Mr. Orlich has a moderate-sized pericardial effusion with a plethoric IVC, but no other features of tamponade. I suspect this is due to a compliant pericardium - his effusion dates back at least to 2011.  Etiology can be effusive, inflammatory, hemorrhagic or other etiologies. He is hemodynamically stable  and will not require pericardiocentesis at this time. Given the risk of possible hemorrhagic etiology, it may be more helpful for him to undergo drainage and pericardial window -  this would also allow [place] to biopsy the pericardium and check cytology of the fluid.  I agree with holding xarelto until his hemoptysis resolves. Ultimately, he will need to go back on anticoagulation, likely after pericardial drainage for stroke prevention (he had a prior stroke).  I have contacted CT surgery for their surgical opinion regarding possible pericardial window.  Thanks for consulting [place].  Time Spent Directly with Patient: 45 minutes  Pixie Casino, MD, [facility] Attending Cardiologist [place] 01/04/2014, 10:23 AM

## 2014-01-04 NOTE — Progress Notes (Addendum)
Name: Jon [name] MRN: [id number] DOB: 03-06-1951    ADMISSION DATE:  01/04/2014 CONSULTATION DATE:  01/04/2014   PRIMARY SERVICE:  PWright  CHIEF COMPLAINT:   Hemoptysis, fever  BRIEF PATIENT DESCRIPTION:  63 y.o.M with CAP, hemoptysis, neg FOB on 4/30, more dyspnea adm with bilateral infiltrates on CXR.   Pt with DM, neuropathy, on insulin pump, s/p bariatric surgery, OSA on cpap, RLS, HTN, HL, GERD, CAF on xarelto.  Pt adm with ongoing hemoptysis,  Bilateral pulm infiltrates and fever  SIGNIFICANT EVENTS / STUDIES:  CT Chest 01/03/2014: large pericardial effusion,  bilat PNA   LINES / TUBES: PIV  CULTURES: Resp cult 01/03/2014 BC x 2 01/03/2014   ANTIBIOTICS: Rocephin 01/03/2014 Azithromycin 01/03/2014   SUBJECTIVE:  Still coughing bloody sputum.  Notes anterolateral chest pain positional  VITAL SIGNS: Temp:  [97.6 F (36.4 C)-99.1 F (37.3 C)] 97.6 F (36.4 C) (05/06 0530) Pulse Rate:  [72-86] 72 (05/06 0530) Resp:  [18-20] 18 (05/06 0530) BP: (106-148)/(62-73) 138/73 mmHg (05/06 0530) SpO2:  [93 %-100 %] 95 % (05/06 0530) Weight:  [117.164 kg (258 lb 4.8 oz)-118.207 kg (260 lb 9.6 oz)] 117.754 kg (259 lb 9.6 oz) (05/06 0530) 60mmHg pulsus paradoxus  PHYSICAL EXAMINATION: General:  Ill appearing WM in NAD Neuro:  No focal deficits HEENT:  Mild JVD, no TMG  orophyrnx clear Neck:  Supple  Cardiovascular:  irreg irreg nl s1 s2 no s3 s4  Distant heart tones Lungs:  Rhonchi, rales both LLs Abdomen:  Soft NT Musculoskeletal:  From, no joint deformity Skin:  clear   Recent Labs Lab 01/03/14 1739 01/04/14 0400  NA 142 136*  K 4.9 4.9  CL 104 101  CO2 27 22  BUN 22 22  CREATININE 1.23 1.12  GLUCOSE 66* 214*    Recent Labs Lab 01/03/14 1739 01/04/14 0400  HGB 14.0 14.1  HCT 43.5 42.5  WBC 8.3 7.5  PLT 151 146*   Ct Chest Wo Contrast  01/03/2014   CLINICAL DATA:  Pneumonia and hemoptysis  EXAM: CT CHEST WITHOUT CONTRAST  TECHNIQUE: Multidetector  CT imaging of the chest was performed following the standard protocol without IV contrast.  COMPARISON:  02/23/2010  FINDINGS: THORACIC INLET/BODY WALL:  No acute abnormality.  MEDIASTINUM:  Moderate volume pericardial effusion, measuring up to 2.6 cm in thickness. The fluid is water density. No mediastinal fat infiltration. Cardiac size within normal limits. Aortic atherosclerosis, without the evidence of acute arterial abnormality. No suspicious lymphadenopathy. Diffuse, mild circumferential esophageal thickening, likely stasis or reflux in the setting of lap band.  LUNG WINDOWS:  Nodular airspace disease throughout the bilateral lungs, most dense in the left upper lobe and right middle lobe. Given history of fever and cough this is consistent with pneumonia. In the setting of hemoptysis, some the opacity could represent hemorrhage. Besides the pneumonia, no other source of hemoptysis identified. No effusion, cavitation, or pneumothorax. No pulmonary edema.  UPPER ABDOMEN:  Unremarkable appearance of lap band.  OSSEOUS:  No acute fracture.  No suspicious lytic or blastic lesions.  IMPRESSION: 1. Multi lobar airspace disease which could represent pneumonia or alveolar hemorrhage (given history of fever and hemoptysis). 2. Moderate volume pericardial effusion, water density. 3. Similar findings were noted on chest CT in 2011.   Electronically Signed   By: Jon [name] M.D.   On: 01/03/2014 23:41    ASSESSMENT / PLAN: Principal Problem:   CAP (community acquired pneumonia) Active Problems:   Hemoptysis  Obstructive chronic bronchitis with exacerbation   Type II or unspecified type diabetes mellitus with neurological manifestations, not stated as uncontrolled   Atrial fibrillation   Pericardial effusion   #1.  Bilateral LL CAP NOS with hemoptysis , on Xarelto.  Neg FOB    Plan   Rx with  Rocephin/azithromycin to continue   D/c medrol   Cont BD neb meds   Off Oxygen   Stay off  xarelto for now      SCDs  #2 DM 2 with neuro complications, ok control  Plan   Cont insulin pump   SSI   CBGs tid AC and HS  #3 neuropathy d/t DM  Plan   Cont Pain control   Reduce lyrica dose  #4 pericardial effusion on CT chest may play role in hemoptysis with elevated pulm artery pressures. Cardiology is [facility] [place],   [place]   will ask [facility] to see the pt , get echo, poss   Pericardiocentesis.  #5 LE edema. Hx of DJD of LE  Plan   Venous doppler LE      Jon [name]  [id number]  Cell  [phone]  If no response or cell goes to voicemail, call beeper [phone]  Pulmonary and [place] Pager: [phone]  01/04/2014, 8:03 AM

## 2014-01-04 NOTE — Care Management Note (Addendum)
    Page 1 of 1   01/05/2014     2:53:14 PM CARE MANAGEMENT NOTE 01/05/2014  Patient:  Jon [name], Jon [name]   Account Number:  [id number]  Date Initiated:  01/04/2014  Documentation initiated by:  Dessa Phi  Subjective/Objective Assessment:   63 Y/O M ADMITTED W/PNA.     Action/Plan:   FROM HOME W/SPOUSE.HAS PCP,PHARMACY.   Anticipated DC Date:  01/05/2014   Anticipated DC Plan:  [place]  CM consult      Choice offered to / List presented to:             Status of service:  Completed, signed off Medicare Important Message given?   (If response is "NO", the following Medicare IM given date fields will be blank) Date Medicare IM given:   Date Additional Medicare IM given:    Discharge Disposition:  HOME/SELF CARE  Per UR Regulation:  Reviewed for med. necessity/level of care/duration of stay  If discussed at Long Length of Stay Meetings, dates discussed:    Comments:  01/04/14 Roselyne Stalnaker RN,BSN NCM [phone] NO ANTICIPATED D/C NEEDS.

## 2014-01-04 NOTE — Progress Notes (Signed)
Echocardiogram 2D Echocardiogram has been performed.  Jon [name] 01/04/2014, 9:35 AM

## 2014-01-04 NOTE — Progress Notes (Signed)
VASCULAR LAB PRELIMINARY  PRELIMINARY  PRELIMINARY  PRELIMINARY  Bilateral lower extremity venous duplex  completed.    Preliminary report:  Bilateral:  No evidence of DVT, superficial thrombosis, or Baker's Cyst.    Nani Ravens, RVT 01/04/2014, 9:05 AM

## 2014-01-04 NOTE — Progress Notes (Addendum)
RT setup pt home CPAP and bleed in 2 LPM O2.  Pt will self administer CPAP when ready.  RT to monitor and assess as needed.

## 2014-01-04 NOTE — Progress Notes (Signed)
Inpatient Diabetes Program Recommendations  AACE/ADA: New Consensus Statement on Inpatient Glycemic Control (2013)  Target Ranges:  Prepandial:   less than 140 mg/dL      Peak postprandial:   less than 180 mg/dL (1-2 hours)      Critically ill patients:  140 - 180 mg/dL   Late entry for 01/03/14  At 18:41    Note: Was paged by staff RN notifying diabetes coordinator of patient with insulin pump.  Stated that an order for insulin pump had been entered, but was asking about how the order was entered.  RN was told to enter the insulin pump order set and make sure the blood sugars are taken AC, HS, and 0200.  She questioned the 0200 blood sugar, but it is listed in the order set as required.  The staff RN stated that the patient contract had been signed and the flow sheet was at the bedside.  Staff RN stated that the patient's basal rates were 0.5 units/hour.  She also stated that Novolog RESISTANT correction scale had been ordered.  It was recommended that this correction scale needed to be discontinued since patient was on his insulin pump.  Will follow up with patient to get basal rates, CHO coverage, and insulin sensitivity from pump.  Harvel Ricks RN BSN CDE

## 2014-01-04 NOTE — Progress Notes (Addendum)
Spoke with patient about his insulin pump settings.  States that he has had this Medtronic pump for about 3 years.  Had bariatric surgery about 2 years ago. Patient states that he eats very little CHO.  States that his blood sugars at home usually run between 67-150 mg/dl. Patient alert and oriented X3.  In assessing his pump: Basal rates: 12 am 1.6 units/hr.   6 am  1.7 units/hr.   12:30 pm 1.7 units/hr.   6:30 pm 2.0 units/hr.        TOTAL basal: 41.85 units CHO ratio: 2.00 at 12 AM         2.00 at 11 AM Sensitivity factor is 27 Blood glucose target: 110 mg/dl  Will continue to follow while in hospital.  Attempted to call Dr. [name] to discuss the Novolog correction scale, but was unable to reach him.  Staff RN to try to reach physician on call.  Harvel Ricks RN BSN CDE

## 2014-01-05 DIAGNOSIS — I4891 Unspecified atrial fibrillation: Secondary | ICD-10-CM | POA: Diagnosis not present

## 2014-01-05 DIAGNOSIS — E669 Obesity, unspecified: Secondary | ICD-10-CM

## 2014-01-05 DIAGNOSIS — J189 Pneumonia, unspecified organism: Secondary | ICD-10-CM | POA: Diagnosis not present

## 2014-01-05 DIAGNOSIS — I319 Disease of pericardium, unspecified: Secondary | ICD-10-CM | POA: Diagnosis not present

## 2014-01-05 DIAGNOSIS — Z9884 Bariatric surgery status: Secondary | ICD-10-CM

## 2014-01-05 DIAGNOSIS — R042 Hemoptysis: Secondary | ICD-10-CM | POA: Diagnosis not present

## 2014-01-05 LAB — BASIC METABOLIC PANEL
BUN: 27 mg/dL — ABNORMAL HIGH (ref 6–23)
CO2: 28 meq/L (ref 19–32)
Calcium: 9.5 mg/dL (ref 8.4–10.5)
Chloride: 95 mEq/L — ABNORMAL LOW (ref 96–112)
Creatinine, Ser: 1.27 mg/dL (ref 0.50–1.35)
GFR calc Af Amer: 67 mL/min — ABNORMAL LOW (ref >90)
GFR calc non Af Amer: 58 mL/min — ABNORMAL LOW (ref >90)
GLUCOSE: 211 mg/dL — AB (ref 70–99)
Potassium: 4 mEq/L (ref 3.7–5.3)
SODIUM: 137 meq/L (ref 137–147)

## 2014-01-05 LAB — CBC WITH DIFFERENTIAL/PLATELET
BASOS PCT: 0 % (ref 0–1)
Basophils Absolute: 0 10*3/uL (ref 0.0–0.1)
EOS PCT: 0 % (ref 0–5)
Eosinophils Absolute: 0 10*3/uL (ref 0.0–0.7)
HCT: 44.2 % (ref 39.0–52.0)
Hemoglobin: 14.8 g/dL (ref 13.0–17.0)
LYMPHS ABS: 1 10*3/uL (ref 0.7–4.0)
Lymphocytes Relative: 7 % — ABNORMAL LOW (ref 12–46)
MCH: 27.5 pg (ref 26.0–34.0)
MCHC: 33.5 g/dL (ref 30.0–36.0)
MCV: 82.2 fL (ref 78.0–100.0)
MONOS PCT: 6 % (ref 3–12)
Monocytes Absolute: 0.8 10*3/uL (ref 0.1–1.0)
Neutro Abs: 11.4 10*3/uL — ABNORMAL HIGH (ref 1.7–7.7)
Neutrophils Relative %: 87 % — ABNORMAL HIGH (ref 43–77)
PLATELETS: 195 10*3/uL (ref 150–400)
RBC: 5.38 MIL/uL (ref 4.22–5.81)
RDW: 14 % (ref 11.5–15.5)
WBC: 13.1 10*3/uL — ABNORMAL HIGH (ref 4.0–10.5)

## 2014-01-05 LAB — GLUCOSE, CAPILLARY
GLUCOSE-CAPILLARY: 113 mg/dL — AB (ref 70–99)
Glucose-Capillary: 208 mg/dL — ABNORMAL HIGH (ref 70–99)
Glucose-Capillary: 194 mg/dL — ABNORMAL HIGH (ref 70–99)

## 2014-01-05 MED ORDER — CEFUROXIME AXETIL 500 MG PO TABS: 500.0000 mg | ORAL_TABLET | Freq: Two times a day (BID) | ORAL | Status: DC

## 2014-01-05 MED ORDER — PREGABALIN 75 MG PO CAPS: 75.0000 mg | ORAL_CAPSULE | Freq: Two times a day (BID) | ORAL | Status: DC

## 2014-01-05 NOTE — Discharge Summary (Addendum)
Physician Discharge Summary  Patient ID: Jon [name] MRN: [id number] DOB/AGE: [birth year] 63 y.o.  Admit date: 01/03/2014 Discharge date: 01/05/2014  Problem List Principal Problem:   CAP (community acquired pneumonia) Active Problems:   Hemoptysis   Obstructive chronic bronchitis with exacerbation   Type II or unspecified type diabetes mellitus with neurological manifestations, not stated as uncontrolled   Atrial fibrillation   Pericardial effusion  HPI: 63 y.o.M with CAP, hemoptysis, neg FOB on 4/30, more dyspnea adm with bilateral infiltrates on CXR.  Pt with DM, neuropathy, on insulin pump, s/p bariatric surgery, OSA on cpap, RLS, HTN, HL, GERD, CAF on xarelto. Pt adm with ongoing hemoptysis, Bilateral pulm infiltrates and fever  Hospital Course: SIGNIFICANT EVENTS / STUDIES:  CT Chest 01/03/2014: large pericardial effusion, bilat PNA  LEDS 5/6 : negative 2D echo 5/6:  Study Conclusions  - Left ventricle: E/e'>10 suggestive of elevated filling pressures The cavity size was normal. There was mild focal basal hypertrophy of the septum. Systolic function was normal. The estimated ejection fraction was in the range of 55% to 60%. Wall motion was normal; there were no regional wall motion abnormalities. - Aortic valve: Trivial regurgitation. - Left atrium: The atrium was moderately dilated. - Right atrium: The atrium was mildly dilated. - Pulmonary arteries: PA peak pressure: 42mm Hg (S). - Inferior vena cava: The vessel was dilated; the respirophasic diameter changes were blunted (< 50%); findings are consistent with elevated central venous pressure. - Pericardium, extracardiac: The pericardial effusion measures 2.8cm at greatest diameter. A moderate, free-flowing pericardial effusion was identified circumferential to the heart. The fluid had no internal echoes.There was no evidence of hemodynamic compromise. Impressions:  - The right ventricular systolic pressure was  increased    LINES / TUBES:  PIV  CULTURES:  Resp cult 01/03/2014  BC x 2 01/03/2014  ANTIBIOTICS:  Rocephin 01/03/2014 >>5/7 Azithromycin 01/03/2014>>5/7 Ceftin 5/7>>5/14    ASSESSMENT / PLAN:  Principal Problem:  CAP (community acquired pneumonia)  Active Problems:  Hemoptysis  Obstructive chronic bronchitis with exacerbation  Type II or unspecified type diabetes mellitus with neurological manifestations, not stated as uncontrolled  Atrial fibrillation  Pericardial effusion   #1. Bilateral LL CAP NOS with hemoptysis , on Xarelto. Neg FOB  Plan  Rx with Rocephin/azithromycin dc'd 5/7 ant placed on Ceftin x 7 more days D/c medrol  Cont BD neb meds  Off Oxygen  Stay off xarelto for now   #2 DM 2 with neuro complications, ok control  Plan  Cont insulin pump  CBGs tid AC and HS   #3 neuropathy d/t DM  Plan  Cont Pain control  Reduce lyrica dose   #4 pericardial effusion on CT chest may play role in hemoptysis with elevated pulm artery pressures. Cardiology is [facility] at [place],  [facility] consulted and 2 d echo performed. Outpatient CVTS for possible pericardial surgery per Dr. Cyndia Bent.    #5 LE edema. Hx of DJD of LE  Plan  Venous doppler LE (negative)       Labs at discharge Lab Results  Component Value Date   CREATININE 1.27 01/05/2014   BUN 27* 01/05/2014   NA 137 01/05/2014   K 4.0 01/05/2014   CL 95* 01/05/2014   CO2 28 01/05/2014   Lab Results  Component Value Date   WBC 13.1* 01/05/2014   HGB 14.8 01/05/2014   HCT 44.2 01/05/2014   MCV 82.2 01/05/2014   PLT 195 01/05/2014   Lab Results  Component  Value Date   ALT 24 01/03/2014   AST 23 01/03/2014   ALKPHOS 79 01/03/2014   BILITOT 1.8* 01/03/2014   Lab Results  Component Value Date   INR 1.37 12/26/2011   INR 1.00 06/19/2009    Current radiology studies Ct Chest Wo Contrast  01/03/2014   CLINICAL DATA:  Pneumonia and hemoptysis  EXAM: CT CHEST WITHOUT CONTRAST  TECHNIQUE: Multidetector CT imaging of the  chest was performed following the standard protocol without IV contrast.  COMPARISON:  02/23/2010  FINDINGS: THORACIC INLET/BODY WALL:  No acute abnormality.  MEDIASTINUM:  Moderate volume pericardial effusion, measuring up to 2.6 cm in thickness. The fluid is water density. No mediastinal fat infiltration. Cardiac size within normal limits. Aortic atherosclerosis, without the evidence of acute arterial abnormality. No suspicious lymphadenopathy. Diffuse, mild circumferential esophageal thickening, likely stasis or reflux in the setting of lap band.  LUNG WINDOWS:  Nodular airspace disease throughout the bilateral lungs, most dense in the left upper lobe and right middle lobe. Given history of fever and cough this is consistent with pneumonia. In the setting of hemoptysis, some the opacity could represent hemorrhage. Besides the pneumonia, no other source of hemoptysis identified. No effusion, cavitation, or pneumothorax. No pulmonary edema.  UPPER ABDOMEN:  Unremarkable appearance of lap band.  OSSEOUS:  No acute fracture.  No suspicious lytic or blastic lesions.  IMPRESSION: 1. Multi lobar airspace disease which could represent pneumonia or alveolar hemorrhage (given history of fever and hemoptysis). 2. Moderate volume pericardial effusion, water density. 3. Similar findings were noted on chest CT in 2011.   Electronically Signed   By: Jorje Guild M.D.   On: 01/03/2014 23:41    Disposition:  01-Home or Self Care   Future Appointments Provider Department Dept Phone   01/24/2014 9:30 AM Elsie Stain, MD [facility] [address] [phone]   02/02/2014 9:45 AM [facility] [place] [phone]       Medication List    STOP taking these medications       clarithromycin 500 MG tablet  Commonly known as:  BIAXIN      TAKE these medications       albuterol 108 (90 BASE) MCG/ACT inhaler  Commonly known as:  PROVENTIL HFA;VENTOLIN HFA  Inhale 2 puffs  into the lungs every 6 (six) hours as needed for wheezing or shortness of breath.     amitriptyline 25 MG tablet  Commonly known as:  ELAVIL  Take 25 mg by mouth at bedtime.     atorvastatin 40 MG tablet  Commonly known as:  LIPITOR  Take 40 mg by mouth daily.     azelastine 0.1 % nasal spray  Commonly known as:  ASTELIN  Place 1 spray into both nostrils at bedtime. Use in each nostril as directed     CALTRATE 600+D PO  Take 2 tablets by mouth 2 (two) times daily.     cefUROXime 500 MG tablet  Commonly known as:  CEFTIN  Take 1 tablet (500 mg total) by mouth 2 (two) times daily with a meal.     CENTRUM SILVER PO  Take 1 tablet by mouth daily.     diltiazem 30 MG tablet  Commonly known as:  CARDIZEM  Take 30 mg by mouth 2 (two) times daily.     fentaNYL 75 MCG/HR  Commonly known as:  DURAGESIC - dosed mcg/hr  Place 1 patch onto the skin every 3 (three) days.  fluticasone 50 MCG/ACT nasal spray  Commonly known as:  FLONASE  Place 1 spray into both nostrils daily.     Fluticasone-Salmeterol 500-50 MCG/DOSE Aepb  Commonly known as:  ADVAIR  Inhale 1 puff into the lungs every 12 (twelve) hours.     hydrALAZINE 50 MG tablet  Commonly known as:  APRESOLINE  Take 50 mg by mouth 4 (four) times daily.     HYDROcodone-homatropine 5-1.5 MG/5ML syrup  Commonly known as:  HYCODAN  Take 5 mLs by mouth every 6 (six) hours as needed for cough.     insulin pump Soln  Inject into the skin continuous. novolog     ipratropium-albuterol 0.5-2.5 (3) MG/3ML Soln  Commonly known as:  DUONEB  Take 3 mLs by nebulization every 6 (six) hours as needed. For shortness of breath     isosorbide mononitrate 60 MG 24 hr tablet  Commonly known as:  IMDUR  Take 60 mg by mouth every morning.     latanoprost 0.005 % ophthalmic solution  Commonly known as:  XALATAN  Place 1 drop into both eyes at bedtime.     losartan 50 MG tablet  Commonly known as:  COZAAR  Take 1 tablet (50 mg total)  by mouth daily.     magnesium oxide 400 MG tablet  Commonly known as:  MAG-OX  Take 1,600 mg by mouth daily.     metolazone 2.5 MG tablet  Commonly known as:  ZAROXOLYN  Take 2.5 mg by mouth. On M,W,F 30 minutes before furosemide.     nitroGLYCERIN 0.4 MG SL tablet  Commonly known as:  NITROSTAT  Place 0.4 mg under the tongue every 5 (five) minutes as needed. For chest pain     NOVOLOG 100 UNIT/ML injection  Generic drug:  insulin aspart  As directed via insulin pump     oxyCODONE-acetaminophen 5-325 MG per tablet  Commonly known as:  PERCOCET/ROXICET  Take 1 tablet by mouth 4 (four) times daily as needed. For severe pain     pantoprazole 40 MG tablet  Commonly known as:  PROTONIX  Take 40 mg by mouth 2 (two) times daily.     potassium chloride SA 20 MEQ tablet  Commonly known as:  K-DUR,KLOR-CON  Take 20 mEq by mouth 3 (three) times daily.     pramipexole 1.5 MG tablet  Commonly known as:  MIRAPEX  Take 1.5 mg by mouth at bedtime.     pregabalin 75 MG capsule  Commonly known as:  LYRICA  Take 1 capsule (75 mg total) by mouth 2 (two) times daily.     RELISTOR 12 MG/0.6ML Soln injection  Generic drug:  methylnaltrexone  Inject 12 mg into the skin as needed (constipation).     SYSTANE PRESERVATIVE FREE 0.4-0.3 % Soln  Generic drug:  Polyethyl Glycol-Propyl Glycol  Apply 1 drop to eye 4 (four) times daily as needed (dryness of eye).     torsemide 20 MG tablet  Commonly known as:  DEMADEX  Take 20 mg by mouth 2 (two) times daily.     triamcinolone 55 MCG/ACT nasal inhaler  Commonly known as:  NASACORT  Place 2 sprays into the nose daily.     zolpidem 10 MG tablet  Commonly known as:  AMBIEN  Take 10 mg by mouth at bedtime.           Follow-up Information   Follow up with Asencion Noble, MD On 01/17/2014. (9:00 am with Dr [name] in [place] office.)  Specialty:  Pulmonary Disease   Contact information:   [address] [place]  [address] [phone]       Please follow up. (Chest surgeon will call you.)        Discharged Condition: good  Time spent on discharge greater than 40 minutes.  Vital signs at Discharge. Temp:  [97.4 F (36.3 C)-98.6 F (37 C)] 97.4 F (36.3 C) (05/07 0545) Pulse Rate:  [57-82] 57 (05/07 0545) Resp:  [18] 18 (05/07 0545) BP: (110-132)/(55-66) 122/66 mmHg (05/07 0545) SpO2:  [93 %-98 %] 98 % (05/07 0545) Weight:  [111.313 kg (245 lb 6.4 oz)] 111.313 kg (245 lb 6.4 oz) (05/07 0545) Office follow up Special Information or instructions. Follow up at [facility] office. May need pericardial effusion tapped or window in future. He has reached maximal hospital benefit but will need further evaluation of pericardial effusion with possible tap vs window. Xarelto is on hold. He is to call cardiology Monday about resuming for Afib. Signed: Richardson Landry Minor ACNP Maryanna Shape PCCM Pager [phone] till 3 pm If no answer page [phone] 01/05/2014, 1:25 PM  I have seen and examined this pt and agree with the above note    DYLAN TRAVER  [id number]  Cell  [phone]  If no response or cell goes to voicemail, call beeper [phone]

## 2014-01-05 NOTE — Progress Notes (Addendum)
Subjective:  Ready to go home. Feels better, no further hemoptysis. He states that he has lost a significant amount of weight since being here. Has lap band. Diuresis. He admits to missing his torsemide occasionally at home. Expressed the importance of diet, salt restriction.  He is had 3 prior cardiac catheterizations. Last of which was approximately 2 years ago. This was reviewed recently by his cardiologist he was felt to be a candidate for knee surgery.  Objective:  Vital Signs in the last 24 hours: Temp:  [97.4 F (36.3 C)-98.6 F (37 C)] 97.4 F (36.3 C) (05/07 0545) Pulse Rate:  [57-82] 57 (05/07 0545) Resp:  [18] 18 (05/07 0545) BP: (110-132)/(55-66) 122/66 mmHg (05/07 0545) SpO2:  [92 %-98 %] 98 % (05/07 0545) Weight:  [245 lb 6.4 oz (111.313 kg)] 245 lb 6.4 oz (111.313 kg) (05/07 0545)  Intake/Output from previous day: 05/06 0701 - 05/07 0700 In: 783 [P.O.:480; I.V.:3; IV Piggyback:300] Out: 5550 [Urine:5550]   Physical Exam: General: Well developed, well nourished, in no acute distress. Head:  Normocephalic and atraumatic. Lungs: Clear to auscultation and percussion. Diminished breath sounds. Heart: Irregularly irregular No murmur, rubs or gallops. Difficult to assess JVD Abdomen: soft, non-tender, positive bowel sounds. Obese Extremities: No clubbing or cyanosis. No edema. Neurologic: Alert and oriented x 3.    Lab Results:  Recent Labs  01/04/14 0400 01/05/14 0305  WBC 7.5 13.1*  HGB 14.1 14.8  PLT 146* 195    Recent Labs  01/04/14 0400 01/05/14 0305  NA 136* 137  K 4.9 4.0  CL 101 95*  CO2 22 28  GLUCOSE 214* 211*  BUN 22 27*  CREATININE 1.12 1.27    Hepatic Function Panel  Recent Labs  01/03/14 1739  PROT 6.6  ALBUMIN 3.5  AST 23  ALT 24  ALKPHOS 79  BILITOT 1.8*    Imaging: Ct Chest Wo Contrast  01/03/2014   CLINICAL DATA:  Pneumonia and hemoptysis  EXAM: CT CHEST WITHOUT CONTRAST  TECHNIQUE: Multidetector CT imaging of the  chest was performed following the standard protocol without IV contrast.  COMPARISON:  02/23/2010  FINDINGS: THORACIC INLET/BODY WALL:  No acute abnormality.  MEDIASTINUM:  Moderate volume pericardial effusion, measuring up to 2.6 cm in thickness. The fluid is water density. No mediastinal fat infiltration. Cardiac size within normal limits. Aortic atherosclerosis, without the evidence of acute arterial abnormality. No suspicious lymphadenopathy. Diffuse, mild circumferential esophageal thickening, likely stasis or reflux in the setting of lap band.  LUNG WINDOWS:  Nodular airspace disease throughout the bilateral lungs, most dense in the left upper lobe and right middle lobe. Given history of fever and cough this is consistent with pneumonia. In the setting of hemoptysis, some the opacity could represent hemorrhage. Besides the pneumonia, no other source of hemoptysis identified. No effusion, cavitation, or pneumothorax. No pulmonary edema.  UPPER ABDOMEN:  Unremarkable appearance of lap band.  OSSEOUS:  No acute fracture.  No suspicious lytic or blastic lesions.  IMPRESSION: 1. Multi lobar airspace disease which could represent pneumonia or alveolar hemorrhage (given history of fever and hemoptysis). 2. Moderate volume pericardial effusion, water density. 3. Similar findings were noted on chest CT in 2011.   Electronically Signed   By: Jorje Guild M.D.   On: 01/03/2014 23:41   Personally viewed.   Assessment/Plan:  Principal Problem:   CAP (community acquired pneumonia) Active Problems:   Hemoptysis   Obstructive chronic bronchitis with exacerbation   Type II or unspecified  type diabetes mellitus with neurological manifestations, not stated as uncontrolled   Atrial fibrillation   Pericardial effusion    - Discussed with Dr. Debara Pickett. Dr. Cyndia Bent has reviewed scans and will see him as outpatient (Dr. Debara Pickett said that Thurmond Butts at [facility] is aware and will set up appointment). No urgent need for  pericardiocentesis.   - Chronic pericardial effusion. Possible inflammatory. Doubt malignant since relatively unchanged from 2011. The effusion does not appear to be of hemodynamic significance. Pulmonary artery pressures systolic are estimated mildly elevated at 45 mm mercury which is most likely being driven by his obesity, obstructive sleep apnea, COPD. There is no evidence of chronic lower extremity edema on exam today (usually a hallmark of constrictive physiology). Diastolic heart failure was present most likely and has been improved significantly with diuresis.  - He has been on Xarelto for approximately one year. No further hemoptysis. There does not appear to be a bloody effusion. When cleared from a hemoptysis standpoint, it would not be unreasonable to resume anticoagulation for atrial fibrillation reasons. - He have followup with Dr. Cyndia Bent.  - Continue to follow up with Dr. Bettina Gavia.  Mark Skains 01/05/2014, 8:16 AM

## 2014-01-05 NOTE — Discharge Instructions (Signed)
Call your cardiologist Monday about resuming Xarelto.

## 2014-01-05 NOTE — Progress Notes (Signed)
Discharge to home, no complaints of any pain or discomfort, d/c instructions and follow up appointments done and given  to the patient, verbalized understanding. PIV removed no s/s of infiltration or swelling noted.

## 2014-01-06 LAB — CULTURE, RESPIRATORY: CULTURE: NORMAL

## 2014-01-09 ENCOUNTER — Telehealth: Payer: Self-pay | Admitting: Critical Care Medicine

## 2014-01-09 NOTE — Telephone Encounter (Signed)
Called spoke with pt. Made aware of recs. He is going Wed to heart doc. Nothing further needed

## 2014-01-09 NOTE — Telephone Encounter (Signed)
Cannot use xarelto until sort out his problems - if I understand it the xarelto is being presribed by his cardiologist and he has issues related to his heart that may be worsened by xarelto (the fluid around it) so would defer decision to Cards or whoever did the initial prescriiption

## 2014-01-09 NOTE — Telephone Encounter (Signed)
Called spoke with pt. He was advised to stop xarelto from hosp admission recently and told to call us when he can restart this. PW is on vacation this week according to schedule. Pt is due for HFU with PW on 01/24/14. MW please advise if you can address this in PW absence? thanks

## 2014-01-10 LAB — CULTURE, BLOOD (ROUTINE X 2)
CULTURE: NO GROWTH
Culture: NO GROWTH

## 2014-01-11 ENCOUNTER — Encounter: Payer: Self-pay | Admitting: Surgery

## 2014-01-11 ENCOUNTER — Ambulatory Visit (INDEPENDENT_AMBULATORY_CARE_PROVIDER_SITE_OTHER): Payer: Medicare Other | Admitting: Surgery

## 2014-01-11 ENCOUNTER — Other Ambulatory Visit: Payer: Self-pay | Admitting: *Deleted

## 2014-01-11 VITALS — BP 140/79 | HR 80 | Resp 20 | Ht 68.0 in | Wt 238.0 lb

## 2014-01-11 DIAGNOSIS — I313 Pericardial effusion (noninflammatory): Secondary | ICD-10-CM

## 2014-01-11 DIAGNOSIS — I3139 Other pericardial effusion (noninflammatory): Secondary | ICD-10-CM

## 2014-01-11 DIAGNOSIS — I319 Disease of pericardium, unspecified: Secondary | ICD-10-CM | POA: Diagnosis not present

## 2014-01-11 NOTE — Progress Notes (Signed)
PCP is Rochel Brome, MD Referring Provider is Pixie Casino., MD  Chief Complaint  Patient presents with  . Pericardial Effusion    Surgical eval, ECHO 01/04/2014    HPI:  The patient is a 63 year old gentleman with a history of IDDM on an insulin pump, diabetic neuropathy, prior smoking and COPD, prior stroke treated with coumadin and then Xarelto, hypertension, hyperlipidemia, and morbid obesity s/p gastric lap band procedure. He was admitted earlier this month with fever and hemoptysis and was diagnosed with CAP. He had a chest CT which showed a moderate sized pericardial effusion. He had a CT scan of the chest in 2011 which showed a small pericardial effusion. An echo showed a moderate free-flowing pericardial effusion with no evidence of hemodynamic compromise but it show evidence of an elevated CVP. He was treated with antibiotics for his pneumonia and was discharged. His anticoagulation was not restarted yet. He says he still feels somewhat short of breath at times. He occasionally has some left sided chest discomfort. He says he has had cardiac workup several times before and has had a couple cardiac caths but they were always negative.  Past Medical History  Diagnosis Date  . Hypothyroidism   . Dysrhythmia     atrial fib takes cardizem,   . Hypertension   . Angina   . Stroke 2012  . Restless leg syndrome   . GERD (gastroesophageal reflux disease)   . Sleep apnea     cpap & oxygen  . Diabetes mellitus     insulin pump   . Pneumonia     hx of  . Hyperlipemia   . H/O hiatal hernia   . Neuromuscular disorder     rls, neuropathy  . Arthritis     right knee  . Obesity (BMI 30-39.9)     Past Surgical History  Procedure Laterality Date  . Hernia repair    . Eye surgery    . Sinus exploration    . Appendectomy    . Cholecystectomy    . Cardiac catheterization  2007  . Laparoscopic gastric banding  2010  . Video bronchoscopy Bilateral 12/29/2013    Procedure:  VIDEO BRONCHOSCOPY WITHOUT FLUORO;  Surgeon: Elsie Stain, MD;  Location: [facility];  Service: Endoscopy;  Laterality: Bilateral;    Family History  Problem Relation Age of Onset  . Anesthesia problems Neg Hx   . Hypotension Neg Hx   . Malignant hyperthermia Neg Hx   . Pseudochol deficiency Neg Hx   . Cancer Mother     Social History History  Substance Use Topics  . Smoking status: Former Smoker -- 4 years    Types: Cigars    Quit date: 09/01/2006  . Smokeless tobacco: Never Used  . Alcohol Use: No     Comment: quit 8 years ago    Current Outpatient Prescriptions  Medication Sig Dispense Refill  . albuterol (PROVENTIL HFA;VENTOLIN HFA) 108 (90 BASE) MCG/ACT inhaler Inhale 2 puffs into the lungs every 6 (six) hours as needed for wheezing or shortness of breath.      Marland Kitchen atorvastatin (LIPITOR) 40 MG tablet Take 40 mg by mouth daily.      Marland Kitchen azelastine (ASTELIN) 137 MCG/SPRAY nasal spray Place 1 spray into both nostrils at bedtime. Use in each nostril as directed      . Calcium Carbonate-Vitamin D (CALTRATE 600+D PO) Take 2 tablets by mouth 2 (two) times daily.       Marland Kitchen  cefUROXime (CEFTIN) 500 MG tablet Take 1 tablet (500 mg total) by mouth 2 (two) times daily with a meal.  14 tablet  0  . clarithromycin (BIAXIN) 500 MG tablet       . diltiazem (CARDIZEM) 30 MG tablet Take 30 mg by mouth 2 (two) times daily.      . fentaNYL (DURAGESIC - DOSED MCG/HR) 100 MCG/HR Place 100 mcg onto the skin every 3 (three) days.      . fluticasone (FLONASE) 50 MCG/ACT nasal spray Place 1 spray into both nostrils daily.      . Fluticasone-Salmeterol (ADVAIR) 500-50 MCG/DOSE AEPB Inhale 1 puff into the lungs every 12 (twelve) hours.      . hydrALAZINE (APRESOLINE) 50 MG tablet Take 50 mg by mouth 4 (four) times daily.      Marland Kitchen HYDROcodone-homatropine (HYCODAN) 5-1.5 MG/5ML syrup Take 5 mLs by mouth every 6 (six) hours as needed for cough.  120 mL  0  . Insulin Human (INSULIN PUMP) 100 unit/ml SOLN  Inject into the skin continuous. novolog      . ipratropium-albuterol (DUONEB) 0.5-2.5 (3) MG/3ML SOLN Take 3 mLs by nebulization every 6 (six) hours as needed. For shortness of breath      . isosorbide mononitrate (IMDUR) 60 MG 24 hr tablet Take 60 mg by mouth every morning.      . latanoprost (XALATAN) 0.005 % ophthalmic solution Place 1 drop into both eyes at bedtime.      Marland Kitchen losartan (COZAAR) 50 MG tablet Take 1 tablet (50 mg total) by mouth daily.  30 tablet  11  . magnesium oxide (MAG-OX) 400 MG tablet Take 1,600 mg by mouth daily.       . metolazone (ZAROXOLYN) 2.5 MG tablet Take 2.5 mg by mouth. On M,W,F 30 minutes before furosemide.      . Multiple Vitamins-Minerals (CENTRUM SILVER PO) Take 1 tablet by mouth daily.      . nitroGLYCERIN (NITROSTAT) 0.4 MG SL tablet Place 0.4 mg under the tongue every 5 (five) minutes as needed. For chest pain      . NOVOLOG 100 UNIT/ML injection As directed via insulin pump      . oxyCODONE-acetaminophen (PERCOCET) 5-325 MG per tablet Take 1 tablet by mouth 4 (four) times daily as needed. For severe pain      . pantoprazole (PROTONIX) 40 MG tablet Take 40 mg by mouth 2 (two) times daily.      Vladimir Faster Glycol-Propyl Glycol (SYSTANE PRESERVATIVE FREE) 0.4-0.3 % SOLN Apply 1 drop to eye 4 (four) times daily as needed (dryness of eye).       . potassium chloride SA (K-DUR,KLOR-CON) 20 MEQ tablet Take 20 mEq by mouth 3 (three) times daily.      . pramipexole (MIRAPEX) 1.5 MG tablet Take 1.5 mg by mouth at bedtime.      . pregabalin (LYRICA) 75 MG capsule Take 1 capsule (75 mg total) by mouth 2 (two) times daily.  60 capsule  0  . RELISTOR 12 MG/0.6ML SOLN injection Inject 12 mg into the skin as needed (constipation).       . torsemide (DEMADEX) 20 MG tablet Take 20 mg by mouth 2 (two) times daily.      Marland Kitchen triamcinolone (NASACORT) 55 MCG/ACT nasal inhaler Place 2 sprays into the nose daily.      Marland Kitchen zolpidem (AMBIEN) 10 MG tablet Take 10 mg by mouth at bedtime.        No current facility-administered medications for  this visit.    Allergies  Allergen Reactions  . Codeine Itching  . Penicillins Rash    Review of Systems  Constitutional: Positive for activity change and fatigue. Negative for fever, chills and appetite change.  HENT: Negative.   Eyes: Negative.   Respiratory: Positive for shortness of breath. Negative for cough and chest tightness.   Cardiovascular: Positive for chest pain and leg swelling. Negative for palpitations.  Gastrointestinal: Negative.   Endocrine: Negative.   Genitourinary: Negative.   Musculoskeletal:       Osteoarthritis right knee that limits mobility and he is suppose to have a knee replacement when other issues are resolved.  Skin: Negative.   Allergic/Immunologic: Negative.   Neurological: Negative.   Hematological: Negative.   Psychiatric/Behavioral: Negative.     BP 140/79  Pulse 80  Resp 20  Ht 5\' 8"  (1.727 m)  Wt 238 lb (107.956 kg)  BMI 36.20 kg/m2  SpO2 97% Physical Exam  Constitutional: He is oriented to person, place, and time.  Obese white male in no distress  HENT:  Head: Normocephalic and atraumatic.  Mouth/Throat: Oropharynx is clear and moist.  Eyes: Conjunctivae and EOM are normal. Pupils are equal, round, and reactive to light.  Neck: Normal range of motion. Neck supple. No JVD present.  Cardiovascular: Normal rate, regular rhythm, normal heart sounds and intact distal pulses.  Exam reveals no gallop and no friction rub.   No murmur heard. Pulmonary/Chest: Effort normal and breath sounds normal. No respiratory distress. He has no rales.  Abdominal: Soft. Bowel sounds are normal. He exhibits no distension and no mass.  Musculoskeletal: Normal range of motion. He exhibits no edema.  Neurological: He is alert and oriented to person, place, and time. He has normal strength. No cranial nerve deficit or sensory deficit.  Skin: Skin is warm and dry.  Psychiatric: He has a normal mood and  affect.     Diagnostic Tests:  CLINICAL DATA: Pneumonia and hemoptysis  EXAM:  CT CHEST WITHOUT CONTRAST  TECHNIQUE:  Multidetector CT imaging of the chest was performed following the  standard protocol without IV contrast.  COMPARISON: 02/23/2010  FINDINGS:  THORACIC INLET/BODY WALL:  No acute abnormality.  MEDIASTINUM:  Moderate volume pericardial effusion, measuring up to 2.6 cm in  thickness. The fluid is water density. No mediastinal fat  infiltration. Cardiac size within normal limits. Aortic  atherosclerosis, without the evidence of acute arterial abnormality.  No suspicious lymphadenopathy. Diffuse, mild circumferential  esophageal thickening, likely stasis or reflux in the setting of lap  band.  LUNG WINDOWS:  Nodular airspace disease throughout the bilateral lungs, most dense  in the left upper lobe and right middle lobe. Given history of fever  and cough this is consistent with pneumonia. In the setting of  hemoptysis, some the opacity could represent hemorrhage. Besides the  pneumonia, no other source of hemoptysis identified. No effusion,  cavitation, or pneumothorax. No pulmonary edema.  UPPER ABDOMEN:  Unremarkable appearance of lap band.  OSSEOUS:  No acute fracture. No suspicious lytic or blastic lesions.  IMPRESSION:  1. Multi lobar airspace disease which could represent pneumonia or  alveolar hemorrhage (given history of fever and hemoptysis).  2. Moderate volume pericardial effusion, water density.  3. Similar findings were noted on chest CT in 2011.  Electronically Signed  By: Jorje Guild M.D.   [facility] [employer]. [place], [place] [address] [phone]  ------------------------------------------------------------ Transthoracic Echocardiography  Patient: Jamel, Winker MR #:  [id number] Study Date: 01/04/2014 Gender: M Age: 26 Height: 170.2cm Weight: 117.7kg BSA: 2.6m^2 Pt. Status: Room:  1405  ADMITTING Girard Cooter ATTENDING Kelby Fam REFERRING Girard Cooter SONOGRAPHER Tresa Res, RDCS PERFORMING [facility], Inpatient cc:  ------------------------------------------------------------ LV EF: 55% - 60%  ------------------------------------------------------------ Indications: Pericardial effusion 423.9.  ------------------------------------------------------------ History: PMH: Atrial fibrillation. Risk factors: Former tobacco use. Hypertension. Diabetes mellitus.  ------------------------------------------------------------ Study Conclusions  - Left ventricle: E/e'>10 suggestive of elevated filling pressures The cavity size was normal. There was mild focal basal hypertrophy of the septum. Systolic function was normal. The estimated ejection fraction was in the range of 55% to 60%. Wall motion was normal; there were no regional wall motion abnormalities. - Aortic valve: Trivial regurgitation. - Left atrium: The atrium was moderately dilated. - Right atrium: The atrium was mildly dilated. - Pulmonary arteries: PA peak pressure: 91mm Hg (S). - Inferior vena cava: The vessel was dilated; the respirophasic diameter changes were blunted (< 50%); findings are consistent with elevated central venous pressure. - Pericardium, extracardiac: The pericardial effusion measures 2.8cm at greatest diameter. A moderate, free-flowing pericardial effusion was identified circumferential to the heart. The fluid had no internal echoes.There was no evidence of hemodynamic compromise. Impressions:  - The right ventricular systolic pressure was increased consistent with mild pulmonary hypertension. Transthoracic echocardiography. M-mode, complete 2D, spectral Doppler, and color Doppler. Height: Height: 170.2cm. Height: 67in. Weight: Weight: 117.7kg. Weight: 259lb. Body mass index: BMI: 40.6kg/m^2. Body surface area:  BSA: 2.58m^2. Blood pressure: 138/73. Patient status: Inpatient. Location: Bedside.  ------------------------------------------------------------  ------------------------------------------------------------ Left ventricle: E/e'>10 suggestive of elevated filling pressures The cavity size was normal. There was mild focal basal hypertrophy of the septum. Systolic function was normal. The estimated ejection fraction was in the range of 55% to 60%. Wall motion was normal; there were no regional wall motion abnormalities.  ------------------------------------------------------------ Aortic valve: Trileaflet; normal thickness, mildly calcified leaflets. Mobility was not restricted. Doppler: Transvalvular velocity was within the normal range. There was no stenosis. Trivial regurgitation.  ------------------------------------------------------------ Aorta: Aortic root: The aortic root was normal in size.  ------------------------------------------------------------ Mitral valve: Structurally normal valve. Mobility was not restricted. Doppler: Transvalvular velocity was within the normal range. There was no evidence for stenosis. Trivial regurgitation. Peak gradient: 69mm Hg (D).  ------------------------------------------------------------ Left atrium: The atrium was moderately dilated.  ------------------------------------------------------------ Right ventricle: The cavity size was normal. Wall thickness was normal. Systolic function was normal.  ------------------------------------------------------------ Pulmonic valve: Structurally normal valve. Cusp separation was normal. Doppler: Transvalvular velocity was within the normal range. There was no evidence for stenosis. Trivial regurgitation.  ------------------------------------------------------------ Tricuspid valve: Structurally normal valve. Doppler: Transvalvular velocity was within the normal range.  Mild regurgitation.  ------------------------------------------------------------ Pulmonary artery: The main pulmonary artery was normal-sized. Systolic pressure was within the normal range.  ------------------------------------------------------------ Right atrium: The atrium was mildly dilated.  ------------------------------------------------------------ Pericardium: The pericardial effusion measures 2.8cm at greatest diameter. A moderate, free-flowing pericardial effusion was identified circumferential to the heart. The fluid had no internal echoes.There was no evidence of hemodynamic compromise.  ------------------------------------------------------------ Systemic veins: Inferior vena cava: The vessel was dilated; the respirophasic diameter changes were blunted (< 50%); findings are consistent with elevated central venous pressure.  ------------------------------------------------------------  2D measurements Normal Doppler Normal Left ventricle measurements LVID ED, 42.7 mm 43-52 Main pulmonary chord, artery PLAX Pressure, S 46 mm =30 LVID ES, 30.8 mm 23-38 Hg chord, Left ventricle PLAX Ea, lat 8.49 cm/ ------- FS, chord, 28 % >29 ann, tiss s  PLAX DP LVPW, ED 10.8 mm ------ E/Ea, lat 16.14 ------- IVS/LVPW 1.14 <1.3 ann, tiss ratio, ED DP Ventricular septum Ea, med 9.66 cm/ ------- IVS, ED 12.3 mm ------ ann, tiss s Aorta DP Root diam, 33 mm ------ E/Ea, med 14.18 ------- ED ann, tiss Left atrium DP AP dim 50 mm ------ Mitral valve AP dim 2.07 cm/m^2 <2.2 Peak E vel 137 cm/ ------- index s Deceleratio 159 ms 150-230 n time Peak 8 mm ------- gradient, D Hg Tricuspid valve Regurg peak 280 cm/ ------- vel s Peak RV-RA 31 mm ------- gradient, S Hg Max regurg 280 cm/ ------- vel s Systemic veins Estimated 15 mm ------- CVP Hg Right ventricle Pressure, S 46 mm <30 Hg  ------------------------------------------------------------ Prepared and  Electronically Authenticated by  Fransico Him, MD [id number]       Impression:  He has a chronic pericardial effusion that is larger now than it was a few years ago and certainly could be causing some dyspnea. This is likely inflammatory or infectious and most likely we will never know. I think it is worth performing a pericardial window to drain the effusion and allow sending the fluid and pericardium for diagnostic purposes. I discussed the procedure of subxyphoid pericardial window with the patient and his wife including alternatives benefits and risks including but not limited to bleeding, blood transfusion, infection, injury to the heart, and recurrence of the effusion. They understand and agree to proceed.  Plan:  He will be scheduled for next Friday, 01/20/2014.

## 2014-01-12 DIAGNOSIS — J449 Chronic obstructive pulmonary disease, unspecified: Secondary | ICD-10-CM | POA: Diagnosis not present

## 2014-01-12 DIAGNOSIS — I309 Acute pericarditis, unspecified: Secondary | ICD-10-CM | POA: Diagnosis not present

## 2014-01-12 DIAGNOSIS — R042 Hemoptysis: Secondary | ICD-10-CM | POA: Diagnosis not present

## 2014-01-12 DIAGNOSIS — J189 Pneumonia, unspecified organism: Secondary | ICD-10-CM | POA: Diagnosis not present

## 2014-01-13 ENCOUNTER — Encounter (HOSPITAL_COMMUNITY): Payer: Self-pay | Admitting: Pharmacy Technician

## 2014-01-17 ENCOUNTER — Encounter: Payer: Self-pay | Admitting: Critical Care Medicine

## 2014-01-17 ENCOUNTER — Ambulatory Visit (INDEPENDENT_AMBULATORY_CARE_PROVIDER_SITE_OTHER): Payer: Medicare Other | Admitting: Critical Care Medicine

## 2014-01-17 VITALS — BP 128/66 | HR 79 | Temp 98.8°F | Ht 68.0 in | Wt 256.0 lb

## 2014-01-17 DIAGNOSIS — J189 Pneumonia, unspecified organism: Secondary | ICD-10-CM

## 2014-01-17 DIAGNOSIS — J441 Chronic obstructive pulmonary disease with (acute) exacerbation: Secondary | ICD-10-CM

## 2014-01-17 DIAGNOSIS — R042 Hemoptysis: Secondary | ICD-10-CM | POA: Diagnosis not present

## 2014-01-17 DIAGNOSIS — I313 Pericardial effusion (noninflammatory): Secondary | ICD-10-CM

## 2014-01-17 DIAGNOSIS — I3139 Other pericardial effusion (noninflammatory): Secondary | ICD-10-CM

## 2014-01-17 DIAGNOSIS — J449 Chronic obstructive pulmonary disease, unspecified: Secondary | ICD-10-CM | POA: Diagnosis not present

## 2014-01-17 LAB — FUNGUS CULTURE W SMEAR
Fungal Smear: NONE SEEN
Special Requests: NORMAL

## 2014-01-17 NOTE — Assessment & Plan Note (Signed)
Copd with exac and bronchitis and hemoptysis Improved Plan  Cont inhaled medications

## 2014-01-17 NOTE — Progress Notes (Signed)
Subjective:    Patient ID: Jon [name], male    DOB: [birth year], 63 y.o.   MRN: [id number]  HPI  01/03/2014 Chief Complaint  Patient presents with  . Acute Visit    with cxr - hemoptysis with dark blood x 2 days.  Temp 100.1 yesterday. Some chest tightness and pain under both arms x 2-3 days.  No increased SOB.  Pt had FOB past week, now worse with dyspnea, fever, cough. Pt having pain in both arms , still with fever.  Now off xarelto.  Off lisinopril Blood is bright red and part is dark.  Pt will expectorate a large amount of mucus.  CBGs 120-195.  Insulin pump is at 0.5units/hr   01/17/2014 Chief Complaint  Patient presents with  . [facility] Follow up    Feels breathing has improved some.  Hemoptysis has resolved.  Still feels wheezing deep in chest - worse qhs.  Has nonprod cough and chest tightness.  Will be admitted to [facility] on Thursday for surgery.   Post hosp OV Pericardial window scheduled this week 01/19/14. Now no further blood,  Still with mucus but not as productive. Notes some wheezing at night. No fever. Not as dyspneic but not active.  CBGs 130-150.      Review of Systems  Constitutional:   No  weight loss, night sweats,  No fevers, chills,+++ fatigue, lassitude. HEENT:   No headaches,  Difficulty swallowing,  Tooth/dental problems,  Sore throat,                No sneezing, itching, ear ache, nasal congestion, post nasal drip,   CV:  No chest pain,  Orthopnea, PND, swelling in lower extremities, anasarca, dizziness, palpitations  GI  No heartburn, indigestion, abdominal pain, nausea, vomiting, diarrhea, change in bowel habits, loss of appetite  Resp: +++ shortness of breath with exertion not at rest.  No excess mucus, no productive cough,  No non-productive cough,  NO coughing up of blood.  No change in color of mucus.  No wheezing.  No chest wall deformity  Skin: no rash or lesions.  GU: no dysuria, change in color of urine, no urgency or frequency.  No  flank pain.  MS:  No joint pain or swelling.  No decreased range of motion.  No back pain.  Psych:  No change in mood or affect. No depression or anxiety.  No memory loss.     Objective:   Physical Exam  Filed Vitals:   01/17/14 0920  BP: 128/66  Pulse: 79  Temp: 98.8 F (37.1 C)  TempSrc: Oral  Height: 5\' 8"  (1.727 m)  Weight: 256 lb (116.121 kg)  SpO2: 97%    Gen: Pleasant, obese, in no distress,  normal affect  ENT: No lesions,  mouth clear,  oropharynx clear, no postnasal drip  Neck: No JVD, no TMG, no carotid bruits  Lungs: No use of accessory muscles, no dullness to percussion, distant bs, clear , no rhonchi  Cardiovascular: RRR, heart sounds normal, no murmur or gallops, no peripheral edema  Abdomen: soft and NT, no HSM,  BS normal insulin pump in place   Musculoskeletal: No deformities, no cyanosis or clubbing  Neuro: alert, non focal  Skin: Warm, no lesions or rashes  No results found.        Assessment & Plan:   CAP (community acquired pneumonia) Hx of RLL CAP NOS. NOW resolved Plan No further ABX  Obstructive chronic bronchitis without exacerbation Copd with  exac and bronchitis and hemoptysis Improved Plan  Cont inhaled medications   Hemoptysis Hemoptysis resolved off xarelto and rx of PNA , change to ARB from [facility],  Plan Observation Keep off xarelto Dr Bettina Gavia aware   Pericardial effusion Pericardial effusion for window per [facility] this week  Plan  F/u after window Dr Bettina Gavia aware     Updated Medication List Outpatient Encounter Prescriptions as of 01/17/2014  Medication Sig  . albuterol (PROVENTIL HFA;VENTOLIN HFA) 108 (90 BASE) MCG/ACT inhaler Inhale 2 puffs into the lungs every 6 (six) hours as needed for wheezing or shortness of breath.  Marland Kitchen atorvastatin (LIPITOR) 40 MG tablet Take 40 mg by mouth daily.  Marland Kitchen azelastine (ASTELIN) 137 MCG/SPRAY nasal spray Place 1 spray into both nostrils at bedtime as needed. Use in each  nostril as directed  . Calcium Carbonate-Vitamin D (CALTRATE 600+D PO) Take 2 tablets by mouth 2 (two) times daily.   Marland Kitchen diltiazem (CARDIZEM) 30 MG tablet Take 30 mg by mouth 2 (two) times daily.  . fluticasone (FLONASE) 50 MCG/ACT nasal spray Place 1 spray into both nostrils daily.  . Fluticasone-Salmeterol (ADVAIR) 500-50 MCG/DOSE AEPB Inhale 1 puff into the lungs every 12 (twelve) hours.  . hydrALAZINE (APRESOLINE) 50 MG tablet Take 50 mg by mouth 4 (four) times daily.  Marland Kitchen HYDROcodone-homatropine (HYCODAN) 5-1.5 MG/5ML syrup Take 5 mLs by mouth every 6 (six) hours as needed for cough.  . Insulin Human (INSULIN PUMP) 100 unit/ml SOLN Inject into the skin continuous. novolog  . ipratropium-albuterol (DUONEB) 0.5-2.5 (3) MG/3ML SOLN Take 3 mLs by nebulization every 6 (six) hours as needed. For shortness of breath  . isosorbide mononitrate (IMDUR) 60 MG 24 hr tablet Take 60 mg by mouth every morning.  . latanoprost (XALATAN) 0.005 % ophthalmic solution Place 1 drop into both eyes at bedtime.  Marland Kitchen losartan (COZAAR) 50 MG tablet Take 1 tablet (50 mg total) by mouth daily.  . magnesium oxide (MAG-OX) 400 MG tablet Take 1,600 mg by mouth daily.   . metolazone (ZAROXOLYN) 2.5 MG tablet Take 2.5 mg by mouth. On M,W,F 30 minutes before furosemide.  . Multiple Vitamins-Minerals (CENTRUM SILVER PO) Take 1 tablet by mouth daily.  Marland Kitchen NOVOLOG 100 UNIT/ML injection As directed via insulin pump  . oxyCODONE-acetaminophen (PERCOCET) 5-325 MG per tablet Take 1 tablet by mouth 4 (four) times daily as needed. For severe pain  . pantoprazole (PROTONIX) 40 MG tablet Take 40 mg by mouth 2 (two) times daily.  Vladimir Faster Glycol-Propyl Glycol (SYSTANE PRESERVATIVE FREE) 0.4-0.3 % SOLN Apply 1 drop to eye 4 (four) times daily as needed (dryness of eye).   . potassium chloride SA (K-DUR,KLOR-CON) 20 MEQ tablet Take 20 mEq by mouth 3 (three) times daily.  . pramipexole (MIRAPEX) 1.5 MG tablet Take 1.5 mg by mouth at bedtime.   . pregabalin (LYRICA) 75 MG capsule Take 1 capsule (75 mg total) by mouth 2 (two) times daily.  Marland Kitchen RELISTOR 12 MG/0.6ML SOLN injection Inject 12 mg into the skin as needed (constipation).   . torsemide (DEMADEX) 20 MG tablet Take 20 mg by mouth 2 (two) times daily.  Marland Kitchen triamcinolone (NASACORT) 55 MCG/ACT nasal inhaler Place 2 sprays into the nose daily.  Marland Kitchen zolpidem (AMBIEN) 10 MG tablet Take 10 mg by mouth at bedtime.  . nitroGLYCERIN (NITROSTAT) 0.4 MG SL tablet Place 0.4 mg under the tongue every 5 (five) minutes as needed. For chest pain

## 2014-01-17 NOTE — Patient Instructions (Signed)
Stay off Xarelto Stay on losartan  Stay on lower lyrica dose Keep appt with Dr Cyndia Bent for pericardial surgery No other medication changes No further antibiotics Return 6 weeks

## 2014-01-17 NOTE — Assessment & Plan Note (Signed)
Hemoptysis resolved off xarelto and rx of PNA , change to ARB from [facility],  Plan Observation Keep off xarelto Dr Bettina Gavia aware

## 2014-01-17 NOTE — Assessment & Plan Note (Signed)
Hx of RLL CAP NOS. NOW resolved Plan No further ABX

## 2014-01-17 NOTE — Assessment & Plan Note (Signed)
Pericardial effusion for window per [facility] Bartle this week  Plan  F/u after window Dr Bettina Gavia aware

## 2014-01-18 ENCOUNTER — Encounter (HOSPITAL_COMMUNITY)
Admission: RE | Admit: 2014-01-18 | Discharge: 2014-01-18 | Disposition: A | Discharge status: Home / Self Care | Payer: Medicare Other | Source: Ambulatory Visit | Attending: Surgery | Admitting: Surgery

## 2014-01-18 ENCOUNTER — Inpatient Hospital Stay (HOSPITAL_COMMUNITY)
Admission: AD | Admit: 2014-01-18 | Discharge: 2014-01-28 | DRG: 238 | Disposition: A | Discharge status: Home / Self Care | Payer: Medicare Other | Source: Ambulatory Visit | Attending: Surgery | Admitting: Surgery

## 2014-01-18 ENCOUNTER — Other Ambulatory Visit: Payer: Self-pay

## 2014-01-18 ENCOUNTER — Other Ambulatory Visit: Payer: Self-pay | Admitting: *Deleted

## 2014-01-18 ENCOUNTER — Encounter (HOSPITAL_COMMUNITY): Payer: Self-pay

## 2014-01-18 ENCOUNTER — Encounter (HOSPITAL_COMMUNITY): Payer: Self-pay | Admitting: General Practice

## 2014-01-18 ENCOUNTER — Inpatient Hospital Stay (HOSPITAL_COMMUNITY): Payer: Medicare Other

## 2014-01-18 VITALS — BP 156/50 | HR 80 | Temp 99.1°F | Resp 18 | Ht 68.0 in | Wt 257.4 lb

## 2014-01-18 DIAGNOSIS — E861 Hypovolemia: Secondary | ICD-10-CM | POA: Diagnosis not present

## 2014-01-18 DIAGNOSIS — E1165 Type 2 diabetes mellitus with hyperglycemia: Secondary | ICD-10-CM

## 2014-01-18 DIAGNOSIS — R042 Hemoptysis: Secondary | ICD-10-CM | POA: Diagnosis present

## 2014-01-18 DIAGNOSIS — K449 Diaphragmatic hernia without obstruction or gangrene: Secondary | ICD-10-CM | POA: Diagnosis present

## 2014-01-18 DIAGNOSIS — G2581 Restless legs syndrome: Secondary | ICD-10-CM | POA: Diagnosis present

## 2014-01-18 DIAGNOSIS — Z452 Encounter for adjustment and management of vascular access device: Secondary | ICD-10-CM | POA: Diagnosis not present

## 2014-01-18 DIAGNOSIS — Z7901 Long term (current) use of anticoagulants: Secondary | ICD-10-CM

## 2014-01-18 DIAGNOSIS — J180 Bronchopneumonia, unspecified organism: Secondary | ICD-10-CM | POA: Diagnosis not present

## 2014-01-18 DIAGNOSIS — G4730 Sleep apnea, unspecified: Secondary | ICD-10-CM | POA: Diagnosis present

## 2014-01-18 DIAGNOSIS — E039 Hypothyroidism, unspecified: Secondary | ICD-10-CM | POA: Diagnosis present

## 2014-01-18 DIAGNOSIS — I319 Disease of pericardium, unspecified: Principal | ICD-10-CM | POA: Diagnosis present

## 2014-01-18 DIAGNOSIS — Z79899 Other long term (current) drug therapy: Secondary | ICD-10-CM

## 2014-01-18 DIAGNOSIS — Z01812 Encounter for preprocedural laboratory examination: Secondary | ICD-10-CM

## 2014-01-18 DIAGNOSIS — L299 Pruritus, unspecified: Secondary | ICD-10-CM | POA: Diagnosis not present

## 2014-01-18 DIAGNOSIS — I4891 Unspecified atrial fibrillation: Secondary | ICD-10-CM | POA: Diagnosis not present

## 2014-01-18 DIAGNOSIS — G4733 Obstructive sleep apnea (adult) (pediatric): Secondary | ICD-10-CM | POA: Diagnosis present

## 2014-01-18 DIAGNOSIS — I482 Chronic atrial fibrillation, unspecified: Secondary | ICD-10-CM | POA: Diagnosis present

## 2014-01-18 DIAGNOSIS — I313 Pericardial effusion (noninflammatory): Secondary | ICD-10-CM

## 2014-01-18 DIAGNOSIS — I3139 Other pericardial effusion (noninflammatory): Secondary | ICD-10-CM

## 2014-01-18 DIAGNOSIS — E662 Morbid (severe) obesity with alveolar hypoventilation: Secondary | ICD-10-CM | POA: Diagnosis present

## 2014-01-18 DIAGNOSIS — J189 Pneumonia, unspecified organism: Secondary | ICD-10-CM | POA: Diagnosis not present

## 2014-01-18 DIAGNOSIS — Z9981 Dependence on supplemental oxygen: Secondary | ICD-10-CM

## 2014-01-18 DIAGNOSIS — M129 Arthropathy, unspecified: Secondary | ICD-10-CM | POA: Diagnosis present

## 2014-01-18 DIAGNOSIS — J982 Interstitial emphysema: Secondary | ICD-10-CM | POA: Diagnosis not present

## 2014-01-18 DIAGNOSIS — T50995A Adverse effect of other drugs, medicaments and biological substances, initial encounter: Secondary | ICD-10-CM | POA: Diagnosis not present

## 2014-01-18 DIAGNOSIS — N9989 Other postprocedural complications and disorders of genitourinary system: Secondary | ICD-10-CM | POA: Diagnosis not present

## 2014-01-18 DIAGNOSIS — G8929 Other chronic pain: Secondary | ICD-10-CM | POA: Diagnosis present

## 2014-01-18 DIAGNOSIS — Z6837 Body mass index (BMI) 37.0-37.9, adult: Secondary | ICD-10-CM

## 2014-01-18 DIAGNOSIS — I10 Essential (primary) hypertension: Secondary | ICD-10-CM | POA: Diagnosis present

## 2014-01-18 DIAGNOSIS — IMO0002 Reserved for concepts with insufficient information to code with codable children: Secondary | ICD-10-CM | POA: Diagnosis not present

## 2014-01-18 DIAGNOSIS — J90 Pleural effusion, not elsewhere classified: Secondary | ICD-10-CM | POA: Diagnosis not present

## 2014-01-18 DIAGNOSIS — E1149 Type 2 diabetes mellitus with other diabetic neurological complication: Secondary | ICD-10-CM | POA: Diagnosis not present

## 2014-01-18 DIAGNOSIS — J9819 Other pulmonary collapse: Secondary | ICD-10-CM | POA: Diagnosis not present

## 2014-01-18 DIAGNOSIS — R490 Dysphonia: Secondary | ICD-10-CM | POA: Diagnosis not present

## 2014-01-18 DIAGNOSIS — Z794 Long term (current) use of insulin: Secondary | ICD-10-CM

## 2014-01-18 DIAGNOSIS — R0489 Hemorrhage from other sites in respiratory passages: Secondary | ICD-10-CM

## 2014-01-18 DIAGNOSIS — K219 Gastro-esophageal reflux disease without esophagitis: Secondary | ICD-10-CM | POA: Diagnosis present

## 2014-01-18 DIAGNOSIS — E785 Hyperlipidemia, unspecified: Secondary | ICD-10-CM | POA: Diagnosis present

## 2014-01-18 DIAGNOSIS — J449 Chronic obstructive pulmonary disease, unspecified: Secondary | ICD-10-CM | POA: Diagnosis present

## 2014-01-18 DIAGNOSIS — G4700 Insomnia, unspecified: Secondary | ICD-10-CM | POA: Diagnosis present

## 2014-01-18 DIAGNOSIS — Z9889 Other specified postprocedural states: Secondary | ICD-10-CM | POA: Diagnosis not present

## 2014-01-18 DIAGNOSIS — J8489 Other specified interstitial pulmonary diseases: Secondary | ICD-10-CM | POA: Diagnosis present

## 2014-01-18 DIAGNOSIS — G589 Mononeuropathy, unspecified: Secondary | ICD-10-CM | POA: Diagnosis present

## 2014-01-18 DIAGNOSIS — Z9884 Bariatric surgery status: Secondary | ICD-10-CM

## 2014-01-18 DIAGNOSIS — I959 Hypotension, unspecified: Secondary | ICD-10-CM | POA: Diagnosis not present

## 2014-01-18 DIAGNOSIS — J4489 Other specified chronic obstructive pulmonary disease: Secondary | ICD-10-CM | POA: Diagnosis present

## 2014-01-18 DIAGNOSIS — D151 Benign neoplasm of heart: Secondary | ICD-10-CM | POA: Diagnosis not present

## 2014-01-18 DIAGNOSIS — Z01818 Encounter for other preprocedural examination: Secondary | ICD-10-CM

## 2014-01-18 DIAGNOSIS — Z9641 Presence of insulin pump (external) (internal): Secondary | ICD-10-CM

## 2014-01-18 DIAGNOSIS — E1142 Type 2 diabetes mellitus with diabetic polyneuropathy: Secondary | ICD-10-CM | POA: Diagnosis present

## 2014-01-18 DIAGNOSIS — N179 Acute kidney failure, unspecified: Secondary | ICD-10-CM | POA: Diagnosis not present

## 2014-01-18 DIAGNOSIS — Z8673 Personal history of transient ischemic attack (TIA), and cerebral infarction without residual deficits: Secondary | ICD-10-CM

## 2014-01-18 DIAGNOSIS — Y9210 Unspecified residential institution as the place of occurrence of the external cause: Secondary | ICD-10-CM | POA: Diagnosis not present

## 2014-01-18 DIAGNOSIS — Z87891 Personal history of nicotine dependence: Secondary | ICD-10-CM

## 2014-01-18 DIAGNOSIS — R918 Other nonspecific abnormal finding of lung field: Secondary | ICD-10-CM | POA: Diagnosis not present

## 2014-01-18 DIAGNOSIS — J8409 Other alveolar and parieto-alveolar conditions: Secondary | ICD-10-CM | POA: Diagnosis not present

## 2014-01-18 HISTORY — DX: Cough, unspecified: R05.9

## 2014-01-18 HISTORY — DX: Dependence on supplemental oxygen: Z99.81

## 2014-01-18 HISTORY — DX: Unspecified asthma, uncomplicated: J45.909

## 2014-01-18 HISTORY — DX: Cough: R05

## 2014-01-18 HISTORY — DX: Dependence on other enabling machines and devices: Z99.89

## 2014-01-18 HISTORY — DX: Fibromyalgia: M79.7

## 2014-01-18 HISTORY — DX: Obstructive sleep apnea (adult) (pediatric): G47.33

## 2014-01-18 HISTORY — DX: Long term (current) use of insulin: Z79.4

## 2014-01-18 HISTORY — DX: Pericardial effusion (noninflammatory): I31.3

## 2014-01-18 HISTORY — DX: Type 2 diabetes mellitus without complications: E11.9

## 2014-01-18 HISTORY — DX: Unspecified atrial fibrillation: I48.91

## 2014-01-18 HISTORY — DX: Acute myocardial infarction, unspecified: I21.9

## 2014-01-18 HISTORY — DX: Other pericardial effusion (noninflammatory): I31.39

## 2014-01-18 LAB — SURGICAL PCR SCREEN
MRSA, PCR: NEGATIVE
Staphylococcus aureus: NEGATIVE

## 2014-01-18 LAB — BLOOD GAS, ARTERIAL
Acid-Base Excess: 1.6 mmol/L (ref 0.0–2.0)
Bicarbonate: 25.1 meq/L — ABNORMAL HIGH (ref 20.0–24.0)
Drawn by: 181601
FIO2: 0.21 %
O2 Saturation: 96.7 %
Patient temperature: 98.6
TCO2: 26.2 mmol/L (ref 0–100)
pCO2 arterial: 35.5 mmHg (ref 35.0–45.0)
pH, Arterial: 7.464 — ABNORMAL HIGH (ref 7.350–7.450)
pO2, Arterial: 83.9 mmHg (ref 80.0–100.0)

## 2014-01-18 LAB — URINALYSIS, ROUTINE W REFLEX MICROSCOPIC
Bilirubin Urine: NEGATIVE
Glucose, UA: NEGATIVE mg/dL
HGB URINE DIPSTICK: NEGATIVE
Ketones, ur: NEGATIVE mg/dL
NITRITE: NEGATIVE
PROTEIN: 30 mg/dL — AB
Specific Gravity, Urine: 1.018 (ref 1.005–1.030)
UROBILINOGEN UA: 1 mg/dL (ref 0.0–1.0)
pH: 8.5 — ABNORMAL HIGH (ref 5.0–8.0)

## 2014-01-18 LAB — CBC
HCT: 45.8 % (ref 39.0–52.0)
Hemoglobin: 15.3 g/dL (ref 13.0–17.0)
MCH: 27.8 pg (ref 26.0–34.0)
MCHC: 33.4 g/dL (ref 30.0–36.0)
MCV: 83.1 fL (ref 78.0–100.0)
Platelets: 181 K/uL (ref 150–400)
RBC: 5.51 MIL/uL (ref 4.22–5.81)
RDW: 14 % (ref 11.5–15.5)
WBC: 19.1 K/uL — ABNORMAL HIGH (ref 4.0–10.5)

## 2014-01-18 LAB — COMPREHENSIVE METABOLIC PANEL
ALT: 31 U/L (ref 0–53)
AST: 20 U/L (ref 0–37)
Albumin: 3.5 g/dL (ref 3.5–5.2)
Alkaline Phosphatase: 82 U/L (ref 39–117)
BUN: 15 mg/dL (ref 6–23)
CO2: 23 mEq/L (ref 19–32)
Calcium: 9.5 mg/dL (ref 8.4–10.5)
Chloride: 104 mEq/L (ref 96–112)
Creatinine, Ser: 0.98 mg/dL (ref 0.50–1.35)
GFR calc non Af Amer: 84 mL/min — ABNORMAL LOW (ref >90)
Glucose, Bld: 139 mg/dL — ABNORMAL HIGH (ref 70–99)
POTASSIUM: 4.8 meq/L (ref 3.7–5.3)
SODIUM: 140 meq/L (ref 137–147)
TOTAL PROTEIN: 6.3 g/dL (ref 6.0–8.3)
Total Bilirubin: 1.4 mg/dL — ABNORMAL HIGH (ref 0.3–1.2)

## 2014-01-18 LAB — TYPE AND SCREEN
ABO/RH(D): A POS
Antibody Screen: NEGATIVE

## 2014-01-18 LAB — GLUCOSE, CAPILLARY: GLUCOSE-CAPILLARY: 155 mg/dL — AB (ref 70–99)

## 2014-01-18 LAB — PROCALCITONIN
Procalcitonin: 0.88 ng/mL
Procalcitonin: 0.86 ng/mL

## 2014-01-18 LAB — URINE MICROSCOPIC-ADD ON

## 2014-01-18 LAB — CK TOTAL AND CKMB (NOT AT ARMC)
CK TOTAL: 39 U/L (ref 7–232)
CK, MB: 1 ng/mL (ref 0.3–4.0)
Relative Index: INVALID (ref 0.0–2.5)

## 2014-01-18 LAB — LACTIC ACID, PLASMA: Lactic Acid, Venous: 1.7 mmol/L (ref 0.5–2.2)

## 2014-01-18 LAB — TROPONIN I: Troponin I: <0.3 ng/mL (ref <0.30)

## 2014-01-18 LAB — PROTIME-INR
INR: 0.98 (ref 0.00–1.49)
Prothrombin Time: 12.8 seconds (ref 11.6–15.2)

## 2014-01-18 LAB — PRO B NATRIURETIC PEPTIDE: Pro B Natriuretic peptide (BNP): 627.2 pg/mL — ABNORMAL HIGH (ref 0–125)

## 2014-01-18 LAB — APTT: aPTT: 26 s (ref 24–37)

## 2014-01-18 MED ORDER — AMITRIPTYLINE HCL 25 MG PO TABS: 25.0000 mg | ORAL_TABLET | Freq: Every day | ORAL | Status: DC

## 2014-01-18 MED ORDER — OXYCODONE HCL 5 MG PO TABS: 5.0000 mg | ORAL_TABLET | Freq: Four times a day (QID) | ORAL | Status: DC | PRN

## 2014-01-18 MED ORDER — FLUTICASONE PROPIONATE 50 MCG/ACT NA SUSP
1.0000 | Freq: Every day | NASAL | Status: DC
  Administered 2014-01-19 – 2014-01-28 (×5): 1 via NASAL
  Filled 2014-01-18 (×3): qty 16

## 2014-01-18 MED ORDER — PRAMIPEXOLE DIHYDROCHLORIDE 1.5 MG PO TABS: 1.5000 mg | ORAL_TABLET | Freq: Every day | ORAL | Status: DC

## 2014-01-18 MED ORDER — PREGABALIN 50 MG PO CAPS
75.0000 mg | ORAL_CAPSULE | Freq: Two times a day (BID) | ORAL | Status: DC
  Administered 2014-01-18 – 2014-01-28 (×19): 75 mg via ORAL
  Filled 2014-01-18 (×5): qty 3
  Filled 2014-01-18: qty 1
  Filled 2014-01-18 (×2): qty 3
  Filled 2014-01-18: qty 1
  Filled 2014-01-18: qty 3
  Filled 2014-01-18: qty 1
  Filled 2014-01-18 (×4): qty 3
  Filled 2014-01-18: qty 1
  Filled 2014-01-18 (×4): qty 3

## 2014-01-18 MED ORDER — PREGABALIN 75 MG PO CAPS: 75.0000 mg | ORAL_CAPSULE | Freq: Two times a day (BID) | ORAL | Status: DC

## 2014-01-18 MED ORDER — INSULIN ASPART 100 UNIT/ML ~~LOC~~ SOLN
0.0000 [IU] | Freq: Three times a day (TID) | SUBCUTANEOUS | Status: DC
  Administered 2014-01-19 (×2): 7 [IU] via SUBCUTANEOUS
  Administered 2014-01-20: 3 [IU] via SUBCUTANEOUS
  Administered 2014-01-20 (×2): 7 [IU] via SUBCUTANEOUS
  Administered 2014-01-21 (×3): 4 [IU] via SUBCUTANEOUS
  Administered 2014-01-22: 7 [IU] via SUBCUTANEOUS
  Administered 2014-01-22: 3 [IU] via SUBCUTANEOUS
  Administered 2014-01-22: 4 [IU] via SUBCUTANEOUS
  Administered 2014-01-23 (×2): 3 [IU] via SUBCUTANEOUS
  Administered 2014-01-23 – 2014-01-24 (×2): 4 [IU] via SUBCUTANEOUS

## 2014-01-18 MED ORDER — MOMETASONE FURO-FORMOTEROL FUM 200-5 MCG/ACT IN AERO
2.0000 | INHALATION_SPRAY | Freq: Two times a day (BID) | RESPIRATORY_TRACT | Status: DC
  Administered 2014-01-18 – 2014-01-26 (×15): 2 via RESPIRATORY_TRACT
  Filled 2014-01-18 (×3): qty 8.8

## 2014-01-18 MED ORDER — INSULIN ASPART 100 UNIT/ML ~~LOC~~ SOLN
4.0000 [IU] | Freq: Three times a day (TID) | SUBCUTANEOUS | Status: DC
  Administered 2014-01-19: 4 [IU] via SUBCUTANEOUS

## 2014-01-18 MED ORDER — VANCOMYCIN HCL IN DEXTROSE 1-5 GM/200ML-% IV SOLN
1000.0000 mg | Freq: Two times a day (BID) | INTRAVENOUS | Status: DC
  Administered 2014-01-19 – 2014-01-23 (×9): 1000 mg via INTRAVENOUS
  Filled 2014-01-18 (×11): qty 200

## 2014-01-18 MED ORDER — ISOSORBIDE MONONITRATE ER 60 MG PO TB24: 60.0000 mg | ORAL_TABLET | Freq: Every day | ORAL | Status: DC

## 2014-01-18 MED ORDER — LATANOPROST 0.005 % OP SOLN: 1.0000 [drp] | Freq: Every day | OPHTHALMIC | Status: DC

## 2014-01-18 MED ORDER — FLUTICASONE PROPIONATE 50 MCG/ACT NA SUSP: 1.0000 | Freq: Every day | NASAL | Status: DC

## 2014-01-18 MED ORDER — HYDRALAZINE HCL 50 MG PO TABS
50.0000 mg | ORAL_TABLET | Freq: Four times a day (QID) | ORAL | Status: DC
  Administered 2014-01-18 – 2014-01-25 (×23): 50 mg via ORAL
  Filled 2014-01-18 (×36): qty 1

## 2014-01-18 MED ORDER — ZOLPIDEM TARTRATE 5 MG PO TABS: 5.0000 mg | ORAL_TABLET | Freq: Every day | ORAL | Status: DC

## 2014-01-18 MED ORDER — HYDROCOD POLST-CHLORPHEN POLST 10-8 MG/5ML PO LQCR: 5.0000 mL | Freq: Two times a day (BID) | ORAL | Status: DC

## 2014-01-18 MED ORDER — FLUTICASONE PROPIONATE 50 MCG/ACT NA SUSP
1.0000 | Freq: Every day | NASAL | Status: DC
  Filled 2014-01-18: qty 16

## 2014-01-18 MED ORDER — TORSEMIDE 20 MG PO TABS: 20.0000 mg | ORAL_TABLET | Freq: Two times a day (BID) | ORAL | Status: DC

## 2014-01-18 MED ORDER — OXYCODONE-ACETAMINOPHEN 5-325 MG PO TABS: 1.0000 | ORAL_TABLET | Freq: Four times a day (QID) | ORAL | Status: DC | PRN

## 2014-01-18 MED ORDER — VANCOMYCIN HCL 10 G IV SOLR
2000.0000 mg | INTRAVENOUS | Status: AC
  Administered 2014-01-18: 2000 mg via INTRAVENOUS
  Filled 2014-01-18: qty 2000

## 2014-01-18 MED ORDER — SODIUM CHLORIDE 0.9 % IV SOLN: 250.0000 mL | INTRAVENOUS | Status: DC | PRN

## 2014-01-18 MED ORDER — IPRATROPIUM-ALBUTEROL 0.5-2.5 (3) MG/3ML IN SOLN: 3.0000 mL | RESPIRATORY_TRACT | Status: DC

## 2014-01-18 MED ORDER — DILTIAZEM HCL 30 MG PO TABS
30.0000 mg | ORAL_TABLET | Freq: Two times a day (BID) | ORAL | Status: DC
  Administered 2014-01-18 – 2014-01-28 (×19): 30 mg via ORAL
  Filled 2014-01-18 (×25): qty 1

## 2014-01-18 MED ORDER — ZOLPIDEM TARTRATE 5 MG PO TABS: 10.0000 mg | ORAL_TABLET | Freq: Every day | ORAL | Status: DC

## 2014-01-18 MED ORDER — SODIUM CHLORIDE 0.9 % IV SOLN
INTRAVENOUS | Status: DC
  Administered 2014-01-18: 19:00:00 via INTRAVENOUS

## 2014-01-18 MED ORDER — AZTREONAM 2 G IJ SOLR
2.0000 g | Freq: Three times a day (TID) | INTRAMUSCULAR | Status: DC
  Administered 2014-01-18 – 2014-01-24 (×17): 2 g via INTRAVENOUS
  Filled 2014-01-18 (×19): qty 2

## 2014-01-18 MED ORDER — ISOSORBIDE MONONITRATE ER 60 MG PO TB24
60.0000 mg | ORAL_TABLET | Freq: Every morning | ORAL | Status: DC
  Administered 2014-01-19 – 2014-01-28 (×10): 60 mg via ORAL
  Filled 2014-01-18 (×10): qty 1

## 2014-01-18 MED ORDER — INSULIN ASPART 100 UNIT/ML ~~LOC~~ SOLN: 4.0000 [IU] | Freq: Three times a day (TID) | SUBCUTANEOUS | Status: DC

## 2014-01-18 MED ORDER — SODIUM CHLORIDE 0.9 % IV SOLN: INTRAVENOUS | Status: DC

## 2014-01-18 MED ORDER — HYDRALAZINE HCL 50 MG PO TABS: 50.0000 mg | ORAL_TABLET | Freq: Four times a day (QID) | ORAL | Status: DC

## 2014-01-18 MED ORDER — HYDROCOD POLST-CHLORPHEN POLST 10-8 MG/5ML PO LQCR
5.0000 mL | Freq: Two times a day (BID) | ORAL | Status: DC
  Administered 2014-01-19 – 2014-01-28 (×17): 5 mL via ORAL
  Filled 2014-01-18 (×17): qty 5

## 2014-01-18 MED ORDER — LATANOPROST 0.005 % OP SOLN
1.0000 [drp] | Freq: Every day | OPHTHALMIC | Status: DC
  Administered 2014-01-18 – 2014-01-27 (×10): 1 [drp] via OPHTHALMIC
  Filled 2014-01-18 (×2): qty 2.5

## 2014-01-18 MED ORDER — ATORVASTATIN CALCIUM 40 MG PO TABS
40.0000 mg | ORAL_TABLET | Freq: Every day | ORAL | Status: DC
  Administered 2014-01-18 – 2014-01-28 (×10): 40 mg via ORAL
  Filled 2014-01-18 (×11): qty 1

## 2014-01-18 MED ORDER — ZOLPIDEM TARTRATE 5 MG PO TABS
5.0000 mg | ORAL_TABLET | Freq: Every day | ORAL | Status: DC
  Administered 2014-01-18 – 2014-01-27 (×10): 5 mg via ORAL
  Filled 2014-01-18 (×10): qty 1

## 2014-01-18 MED ORDER — ATORVASTATIN CALCIUM 40 MG PO TABS: 40.0000 mg | ORAL_TABLET | Freq: Every day | ORAL | Status: DC

## 2014-01-18 MED ORDER — INSULIN ASPART 100 UNIT/ML ~~LOC~~ SOLN: 0.0000 [IU] | Freq: Three times a day (TID) | SUBCUTANEOUS | Status: DC

## 2014-01-18 MED ORDER — PANTOPRAZOLE SODIUM 40 MG PO TBEC
40.0000 mg | DELAYED_RELEASE_TABLET | Freq: Two times a day (BID) | ORAL | Status: DC
  Administered 2014-01-18 – 2014-01-28 (×19): 40 mg via ORAL
  Filled 2014-01-18 (×17): qty 1

## 2014-01-18 MED ORDER — ALBUTEROL SULFATE (2.5 MG/3ML) 0.083% IN NEBU: 2.5000 mg | INHALATION_SOLUTION | RESPIRATORY_TRACT | Status: DC | PRN

## 2014-01-18 MED ORDER — MOMETASONE FURO-FORMOTEROL FUM 200-5 MCG/ACT IN AERO: 2.0000 | INHALATION_SPRAY | Freq: Two times a day (BID) | RESPIRATORY_TRACT | Status: DC

## 2014-01-18 MED ORDER — ALBUTEROL SULFATE (2.5 MG/3ML) 0.083% IN NEBU
2.5000 mg | INHALATION_SOLUTION | RESPIRATORY_TRACT | Status: DC | PRN
Start: 2014-01-18 — End: 2014-01-28

## 2014-01-18 MED ORDER — LOSARTAN POTASSIUM 50 MG PO TABS
50.0000 mg | ORAL_TABLET | Freq: Every day | ORAL | Status: DC
  Administered 2014-01-18 – 2014-01-25 (×7): 50 mg via ORAL
  Filled 2014-01-18 (×8): qty 1

## 2014-01-18 MED ORDER — IPRATROPIUM-ALBUTEROL 0.5-2.5 (3) MG/3ML IN SOLN
3.0000 mL | Freq: Four times a day (QID) | RESPIRATORY_TRACT | Status: DC
  Administered 2014-01-18 – 2014-01-27 (×32): 3 mL via RESPIRATORY_TRACT
  Filled 2014-01-18 (×32): qty 3

## 2014-01-18 MED ORDER — SODIUM CHLORIDE 0.9 % IV SOLN
250.0000 mL | INTRAVENOUS | Status: DC | PRN
Start: 2014-01-18 — End: 2014-01-24

## 2014-01-18 MED ORDER — POTASSIUM CHLORIDE CRYS ER 20 MEQ PO TBCR
20.0000 meq | EXTENDED_RELEASE_TABLET | Freq: Three times a day (TID) | ORAL | Status: DC
  Administered 2014-01-18 – 2014-01-19 (×3): 20 meq via ORAL
  Filled 2014-01-18 (×5): qty 1

## 2014-01-18 MED ORDER — VANCOMYCIN HCL 10 G IV SOLR
1500.0000 mg | INTRAVENOUS | Status: DC
  Filled 2014-01-18 (×2): qty 1500

## 2014-01-18 MED ORDER — ADULT MULTIVITAMIN W/MINERALS CH: 1.0000 | ORAL_TABLET | Freq: Every day | ORAL | Status: DC

## 2014-01-18 MED ORDER — PANTOPRAZOLE SODIUM 40 MG PO TBEC: 40.0000 mg | DELAYED_RELEASE_TABLET | Freq: Every day | ORAL | Status: DC

## 2014-01-18 MED ORDER — TORSEMIDE 20 MG PO TABS
20.0000 mg | ORAL_TABLET | Freq: Two times a day (BID) | ORAL | Status: DC
  Administered 2014-01-18 – 2014-01-25 (×13): 20 mg via ORAL
  Filled 2014-01-18 (×16): qty 1

## 2014-01-18 MED ORDER — MOMETASONE FURO-FORMOTEROL FUM 100-5 MCG/ACT IN AERO: 2.0000 | INHALATION_SPRAY | Freq: Two times a day (BID) | RESPIRATORY_TRACT | Status: DC

## 2014-01-18 MED ORDER — DILTIAZEM HCL 30 MG PO TABS: 30.0000 mg | ORAL_TABLET | Freq: Two times a day (BID) | ORAL | Status: DC

## 2014-01-18 MED ORDER — PRAMIPEXOLE DIHYDROCHLORIDE 1.5 MG PO TABS
1.5000 mg | ORAL_TABLET | Freq: Every day | ORAL | Status: DC
  Administered 2014-01-18 – 2014-01-27 (×10): 1.5 mg via ORAL
  Filled 2014-01-18 (×13): qty 1

## 2014-01-18 NOTE — Progress Notes (Signed)
Anesthesia PAT Evaluation:  Patient is a 63 year old male scheduled for subxyphoid pericardial window on 01/19/14 by Dr. Cyndia Bent.    History includes former smoker, OSA, HTN, afib, agina, CVA '12, RLS, GERD, DM2 on an insulin pump, neuropathy, HLD, hiatal hernia, arthritis, asthma, CAP 12/2013 with hemoptysis, gastric banding, cholecystectomy.  BMI is consistent with obesity.  Pulmonologist is Dr. [name].  Primary cardiologist is Dr. Bettina Gavia with [facility]) in [place]. He was seen during his recent hospitalization by cardiologists Dr. Debara Pickett and Dr. Marlou Porch. Xarelto was placed on hold since at least 01/05/14 due to his hemoptysis.  Notes indicate that there has been some communication with Dr. Bettina Gavia regarding holding of his Xarelto and plans for surgery.  EKG on 01/18/14 showed afib @ 88 bpm, low voltage QRS, incomplete left BBB. Afib is new when compared to his 12/26/11 EKG.  He had a left BBB at that time.  Patient with known afib and left BBB history. April 2015 cardiology notes indicated that afib is chronic.    Echo on 01/04/14 showed: - Left ventricle: E/e'>10 suggestive of elevated filling pressures The cavity size was normal. There was mild focal basal hypertrophy of the septum. Systolic function was normal. The estimated ejection fraction was in the range of 55% to 60%. Wall motion was normal; there were no regional wall motion abnormalities. - Aortic valve: Trivial regurgitation. - Left atrium: The atrium was moderately dilated. - Right atrium: The atrium was mildly dilated. - Pulmonary arteries: PA peak pressure: 82mm Hg (S). - Inferior vena cava: The vessel was dilated; the respirophasic diameter changes were blunted (< 50%); findings are consistent with elevated central venous pressure. - Pericardium, extracardiac: The pericardial effusion measures 2.8cm at greatest diameter. A moderate, free-flowing pericardial effusion was identified circumferential to the heart. The  fluid had no internal echoes.There was no evidence of hemodynamic compromise. Impressions: The right ventricular systolic pressure was increased consistent with mild pulmonary hypertension.  He reports an unremarkable cardiac cath within the past five years at [facility], records requested. Cardiology notes indicate that it showed only minor CAD.    The formal CXR report on 01/18/14 is still pending from radiology, but cardiac shadow remains enlarged with persistent patchy infiltrative density in the left perihilar region.  Right lung base infiltrate appears to be improving.    Chest CT on 01/03/14 showed:  1. Multi lobar airspace disease which could represent pneumonia or alveolar hemorrhage (given history of fever and hemoptysis).  2. Moderate volume pericardial effusion, water density.  3. Similar findings were noted on chest CT in 2011.   I saw patient due to reports of recurrent hemoptysis this morning.  He was up coughing all night and began coughing up bright red blood this morning. He said it was not blood tinged mucous or blot clots.  It said it was enough to stain a body towel.  His last episode was around 11:45 AM.  He felt a little more SOB breath and used his supplemental O2 @ 2L/[place] briefly, but now feels breathing back to baseline.  He has had chronic chest tightness (all the time) for weeks now.  The characteristics have not changed but the intensity was worse this morning. His coughing spells lead to some gagging/throwing-up, but otherwise no nausea, no hematochezia, jaw or arm pain, presyncope.  On exam, patient is obese, but no acute distress. Heart rhythm slightly irregular, no significant murmur noted.  Lungs showed mild crackles at left base, but  otherwise clear without wheezing.    I spoke with Levonne Spiller, RN at [facility] who notified Dr. Cyndia Bent of above symptoms.  Dr. Cyndia Bent requested that I contact Dr. [name].  Dr. [name] is out of the hospital today, but said he would send  one of his partners over to evaluate patient.  He feels patient is likely having intermittent hemoptysis due to increased pulmonary pressures related to his pericardial effusion. Velna Hatchet, NP with [facility] in to evaluate patient.  CBC now back showing a WBC of 19K. Plan to admit patient to 6N, room 13.  Dr. Cyndia Bent to follow-up with pulmonology to determine if patient should proceed with pericardial window tomorrow.  I'll update our anesthesiologist as well since patient with recent hemoptysis.    George Hugh [facility] Short Stay Center/Anesthesiology Phone [phone] 01/18/2014 3:21 PM

## 2014-01-18 NOTE — H&P (Signed)
Name: Jon [name]  MRN: [id number]  DOB: 1951/02/20  ADMISSION DATE: 01/18/2014  CONSULTATION DATE: 01/18/14  REFERRING MD : Dr. Cyndia Bent  PRIMARY SERVICE: PCM  CHIEF COMPLAINT: Hemoptysis  BRIEF PATIENT DESCRIPTION: 63 y/o M admitted with hemoptysis. Patient was planned for pericardial window on 5/21. Upon presentation for pre-eval, he reported onset of hemoptysis am of admit. PCCM called for evaluation.  SIGNIFICANT EVENTS / STUDIES:  5/20 - admit with hemoptysis at pre-op screening  LINES / TUBES:   CULTURES:   ANTIBIOTICS:  Anti-infectives   Start     Dose/Rate Route Frequency Ordered Stop   01/19/14 2200  vancomycin (VANCOCIN) IVPB 1000 mg/200 mL premix     1,000 mg 200 mL/hr over 60 Minutes Intravenous Every 12 hours 01/18/14 1910     01/19/14 0600  vancomycin (VANCOCIN) 1,500 mg in sodium chloride 0.9 % 500 mL IVPB     1,500 mg 250 mL/hr over 120 Minutes Intravenous On call to O.R. 01/18/14 1406 01/20/14 0559   01/18/14 2000  aztreonam (AZACTAM) 2 g in dextrose 5 % 50 mL IVPB     2 g 100 mL/hr over 30 Minutes Intravenous Every 8 hours 01/18/14 1910     01/18/14 1915  vancomycin (VANCOCIN) 2,000 mg in sodium chloride 0.9 % 500 mL IVPB     2,000 mg 250 mL/hr over 120 Minutes Intravenous NOW 01/18/14 1910 01/19/14 1915       AUTOIMMUNE 01/18/14 >>  HISTORY OF PRESENT ILLNESS: 63 y/o M, former smoker (quit 2009, smoked 2-3 packs of cigars per day), former ETOH (4 1/2 gallon whiskey per week) with PMH of hypothyroidism, HTN, CVA, RLS, HLD, Chronic AFib, DM on insulin pump, morbid obesity s/p lap band, OSA on nocturnal oxygen / CPAP, chronic pericardial effusion (since 2011) and recent hemoptysis with admit at [facility] from 5/5-5/7 for CAP with related hemoptysis. Xarelto was discontinued at that time and hemoptysis resolved. Patient reports he felt "really good" until the am of presentation. He woke with coughing at 0830 and felt terrible. He got up and felt chilled - temp at that time  was 99.9. His am blood sugar was 145. Cough progressed & he began producing bright red sputum. He estimates 1/2 to 3/4 cup volume. He was briefly short of breath and used his oxygen with improvement. He denies chest pain, shortness of breath, n/v/d, abd pain, blood in stool. He presented to pre-admissions testing for planned pericardial window and informed nursing of above symptoms. PCCM called for evaluation / admission.  He is a disabled Education officer, museum. Married, lives with wife & step-daughter.  PAST MEDICAL HISTORY :  Past Medical History   Diagnosis  Date   .  Hypothyroidism    .  Dysrhythmia      atrial fib takes cardizem,   .  Hypertension    .  Angina    .  Stroke  2012   .  Restless leg syndrome    .  GERD (gastroesophageal reflux disease)    .  Diabetes mellitus      insulin pump   .  Hyperlipemia    .  H/O hiatal hernia    .  Neuromuscular disorder      rls, neuropathy   .  Arthritis      right knee   .  Obesity (BMI 30-39.9)    .  Pericardial effusion    .  Sleep apnea      cpap & oxygen   .  Asthma    .  Pneumonia  5/15     hx of   .  Cough      coughing up blood- started last nite, seems to be worse now    Past Surgical History   Procedure  Laterality  Date   .  Hernia repair     .  Eye surgery     .  Sinus exploration     .  Appendectomy     .  Cholecystectomy     .  Cardiac catheterization   2007   .  Laparoscopic gastric banding   2010   .  Video bronchoscopy  Bilateral  12/29/2013     Procedure: VIDEO BRONCHOSCOPY WITHOUT FLUORO; Surgeon: Elsie Stain, MD; Location: [facility] ENDOSCOPY; Service: Endoscopy; Laterality: Bilateral;    Prior to Admission medications   Medication  Sig  Start Date  End Date  Taking?  Authorizing Provider   rivaroxaban (XARELTO) 20 MG TABS tablet  Take 10 mg by mouth daily with supper.    Yes  Historical Provider, MD   albuterol (PROVENTIL HFA;VENTOLIN HFA) 108 (90 BASE) MCG/ACT inhaler  Inhale 2 puffs into the  lungs every 6 (six) hours as needed for wheezing or shortness of breath.     Historical Provider, MD   atorvastatin (LIPITOR) 40 MG tablet  Take 40 mg by mouth daily.     Historical Provider, MD   azelastine (ASTELIN) 137 MCG/SPRAY nasal spray  Place 1 spray into both nostrils at bedtime as needed. Use in each nostril as directed     Historical Provider, MD   Calcium Carbonate-Vitamin D (CALTRATE 600+D PO)  Take 2 tablets by mouth 2 (two) times daily.     Historical Provider, MD   diltiazem (CARDIZEM) 30 MG tablet  Take 30 mg by mouth 2 (two) times daily.     Historical Provider, MD   fluticasone (FLONASE) 50 MCG/ACT nasal spray  Place 1 spray into both nostrils daily.     Historical Provider, MD   Fluticasone-Salmeterol (ADVAIR) 500-50 MCG/DOSE AEPB  Inhale 1 puff into the lungs every 12 (twelve) hours.     Historical Provider, MD   hydrALAZINE (APRESOLINE) 50 MG tablet  Take 50 mg by mouth 4 (four) times daily.     Historical Provider, MD   HYDROcodone-homatropine (HYCODAN) 5-1.5 MG/5ML syrup  Take 5 mLs by mouth every 6 (six) hours as needed for cough.  12/29/13    Elsie Stain, MD   Insulin Human (INSULIN PUMP) 100 unit/ml SOLN  Inject into the skin continuous. novolog     Historical Provider, MD   ipratropium-albuterol (DUONEB) 0.5-2.5 (3) MG/3ML SOLN  Take 3 mLs by nebulization every 6 (six) hours as needed. For shortness of breath     Historical Provider, MD   isosorbide mononitrate (IMDUR) 60 MG 24 hr tablet  Take 60 mg by mouth every morning.     Historical Provider, MD   latanoprost (XALATAN) 0.005 % ophthalmic solution  Place 1 drop into both eyes at bedtime.     Historical Provider, MD   losartan (COZAAR) 50 MG tablet  Take 1 tablet (50 mg total) by mouth daily.  01/02/14    Elsie Stain, MD   magnesium oxide (MAG-OX) 400 MG tablet  Take 1,600 mg by mouth daily.     Historical Provider, MD   metolazone (ZAROXOLYN) 2.5 MG tablet  Take 2.5 mg by mouth. On M,W,F 30 minutes before  furosemide.  Historical Provider, MD   Multiple Vitamins-Minerals (CENTRUM SILVER PO)  Take 1 tablet by mouth daily.     Historical Provider, MD   nitroGLYCERIN (NITROSTAT) 0.4 MG SL tablet  Place 0.4 mg under the tongue every 5 (five) minutes as needed. For chest pain     Historical Provider, MD   NOVOLOG 100 UNIT/ML injection  As directed via insulin pump     Historical Provider, MD   oxyCODONE-acetaminophen (PERCOCET) 5-325 MG per tablet  Take 1 tablet by mouth 4 (four) times daily as needed. For severe pain     Historical Provider, MD   pantoprazole (PROTONIX) 40 MG tablet  Take 40 mg by mouth 2 (two) times daily.     Historical Provider, MD   Polyethyl Glycol-Propyl Glycol (SYSTANE PRESERVATIVE FREE) 0.4-0.3 % SOLN  Apply 1 drop to eye 4 (four) times daily as needed (dryness of eye).     Historical Provider, MD   potassium chloride SA (K-DUR,KLOR-CON) 20 MEQ tablet  Take 20 mEq by mouth 3 (three) times daily.     Historical Provider, MD   pramipexole (MIRAPEX) 1.5 MG tablet  Take 1.5 mg by mouth at bedtime.     Historical Provider, MD   pregabalin (LYRICA) 75 MG capsule  Take 1 capsule (75 mg total) by mouth 2 (two) times daily.  01/05/14    Grace Bushy Minor, NP   RELISTOR 12 MG/0.6ML SOLN injection  Inject 12 mg into the skin as needed (constipation).     Historical Provider, MD   torsemide (DEMADEX) 20 MG tablet  Take 20 mg by mouth 2 (two) times daily.     Historical Provider, MD   triamcinolone (NASACORT) 55 MCG/ACT nasal inhaler  Place 2 sprays into the nose daily.     Historical Provider, MD   zolpidem (AMBIEN) 10 MG tablet  Take 10 mg by mouth at bedtime.     Historical Provider, MD    Allergies   Allergen  Reactions   .  Codeine  Itching   .  Penicillins  Rash    FAMILY HISTORY:  Family History   Problem  Relation  Age of Onset   .  Anesthesia problems  Neg Hx    .  Hypotension  Neg Hx    .  Malignant hyperthermia  Neg Hx    .  Pseudochol deficiency  Neg Hx    .  Cancer   Mother     SOCIAL HISTORY:  reports that he quit smoking about 7 years ago. His smoking use included Cigars. He has never used smokeless tobacco. He reports that he does not drink alcohol. His drug history is not on file.  REVIEW OF SYSTEMS:  Constitutional: Negative for fever, chills, weight loss, malaise/fatigue and diaphoresis.  HENT: Negative for hearing loss, ear pain, nosebleeds, congestion, sore throat, neck pain, tinnitus and ear discharge.  Eyes: Negative for blurred vision, double vision, photophobia, pain, discharge and redness.  Respiratory: Negative for sputum production, shortness of breath, wheezing and stridor. Positive for cough, hemoptysis  Cardiovascular: Negative for chest pain, palpitations, orthopnea, claudication, leg swelling and PND.  Gastrointestinal: Negative for heartburn, nausea, vomiting, abdominal pain, diarrhea, constipation, blood in stool and melena.  Genitourinary: Negative for dysuria, urgency, frequency, hematuria and flank pain.  Musculoskeletal: Negative for myalgias, back pain, joint pain and falls.  Skin: Negative for itching and rash.  Neurological: Negative for dizziness, tingling, tremors, sensory change, speech change, focal weakness, seizures, loss of consciousness, weakness and headaches.  Endo/Heme/Allergies: Negative for environmental allergies and polydipsia. Does not bruise/bleed easily.  SUBJECTIVE:  VITAL SIGNS:  Temp: [99.1 F (37.3 C)] 99.1 F (37.3 C) (05/20 1255)  Pulse Rate: [80] 80 (05/20 1255)  Resp: [18] 18 (05/20 1255)  BP: (156)/(50) 156/50 mmHg (05/20 1255)  SpO2: [95 %] 95 % (05/20 1255)  Weight: [257 lb 6 oz (116.745 kg)] 257 lb 6 oz (116.745 kg) (05/20 1255)  PHYSICAL EXAMINATION:  General: Morbidly obese male in NAD  Neuro: AAOx4, speech clear, MAE  HEENT: Mm pink/moist, short / thick neck, long beard  Cardiovascular: s1s2 rrr, no m/r/g  Lungs: resp's even/non-labored, diminished on L, essentially clear on R  Abdomen:  Obese, soft, bsx4 active  Musculoskeletal: No acute deformities  Skin: Warm/dry, trace LE edema    PULMONARY  Recent Labs Lab 01/18/14 1340  PHART 7.464*  PCO2ART 35.5  PO2ART 83.9  HCO3 25.1*  TCO2 26.2  O2SAT 96.7    CBC  Recent Labs Lab 01/18/14 1340  HGB 15.3  HCT 45.8  WBC 19.1*  PLT 181    COAGULATION  Recent Labs Lab 01/18/14 1340  INR 0.98    CARDIAC  No results found for this basename: TROPONINI,  in the last 168 hours No results found for this basename: PROBNP,  in the last 168 hours   CHEMISTRY  Recent Labs Lab 01/18/14 1340  NA 140  K 4.8  CL 104  CO2 23  GLUCOSE 139*  BUN 15  CREATININE 0.98  CALCIUM 9.5   Estimated Creatinine Clearance: 93.8 ml/min (by C-G formula based on Cr of 0.98).   LIVER  Recent Labs Lab 01/18/14 1340  AST 20  ALT 31  ALKPHOS 82  BILITOT 1.4*  PROT 6.3  ALBUMIN 3.5  INR 0.98     INFECTIOUS  Recent Labs Lab 01/18/14 1828  PROCALCITON 0.88     ENDOCRINE CBG (last 3)  No results found for this basename: GLUCAP,  in the last 72 hours       IMAGING x48h  Chest 2 View  01/18/2014   CLINICAL DATA:  Pericardial effusion  EXAM: CHEST  2 VIEW  COMPARISON:  01/03/2014  FINDINGS: Cardiac shadow is again enlarged. Increasing infiltrate is noted in the left upper lobe when compare with the prior exam. No sizable effusion is seen. A gastric lap band is noted.  IMPRESSION: Increasing left upper lobe infiltrate.   Electronically Signed   By: Inez Catalina M.D.   On: 01/18/2014 16:06      ASSESSMENT / PLAN:  Cough  Hemoptysis - recurrent, off anti-coagulants. No hx of cyclic cough in last week. Recent FOB 4/30 with no endobronchial lesions, negative pathology. Last CT chest on 5/5.  Former Heavy Tobacco Abuse  OSA - uses CPAP QHS  Nocturnal Oxygen Use - 2L at baseline  R/O HCAP - leukocytosis, cough with hemoptysis.  Plan:  -continue nocturnal CPAP with bled in O2  -cough suppression with  tussionex  -likely will need repeat FOB for airway assessment  (staff MD note: unlikely to be helpful due to prior negative) -empiric HCAP coverage  -assess PCT  -PRN bronchodilators  -trend H/H with hemoptysis  Moderate Pericardial Effusion - planned window for 5/21  HTN  HLD  Chronic AFib  Plan:  -continue lipitor  -continue cardizem, hydralazine, imdur, torsemide  -hold M/W/F zaroxolyn  -assess EKG  Diabetes - on insulin pump at home. D/C upon arrial  Plan:  -SSI with resistant scale  -May require basal  insulin   Morbid Obesity  S/P Lap Band  GERD   Plan:  -carb modified diet  -pt was scheduled for band filling this week, will need to reschedule  Chronic Pain  RLS  Insomnia  Plan:  -continue lyrica, ambien, mirapex  -continue fentanyl patch for now - not ordered yet (pt has home patch)  -PRN Percocet     Noe Gens, NP-C  [facility]  Pgr: [phone] or [phone]  01/18/2014, 3:00 PM    STAFF NOTE: I, Dr Ann Lions have personally reviewed patient's available data, including medical history, events of note, physical examination and test results as part of my evaluation. I have discussed with resident/NP and other care providers such as pharmacist, RN and RRT.  In addition,  I personally evaluated patient and elicited key findings of recurrent hemoptysis. LUL infiltrate appears worse compared to earlier in May 2015. Early May 2015 Ct suggest alveolar hge (focal) due to GGO on imaging. Doubt infectious process anymore though HCAP certainly possible. Given chronic pericardial effusion, will expand Ddx to autoimmune disorders and vasculitis. Will check extensively for same including ESR.  Elevated PAP due to pericardial effusion also possible as cause. Will also get PCT profile and BNP.  Will also get CT chest. In terms of pericardial effusion: likely is contributing to his worsening of chronic dyspnea so he will need pericardial window/biopsy. Certainly  can wait till medical workup is complete. Have placed call to [facility] to update them..  Rest per NP/medical resident whose note is outlined above and that I agree with     Dr. Brand Males, M.D., [facility].C.P Pulmonary and Critical Care Medicine Staff Physician [facility] Pager: [phone], If no answer or between  15:00h - 7:00h: call [phone]  01/18/2014 8:42 PM

## 2014-01-18 NOTE — Progress Notes (Signed)
Pt. Placed on telemetry.  Will continue to monitor. Jon [name]

## 2014-01-18 NOTE — Progress Notes (Signed)
Paged portable for scds and continuous pulse ox. Syliva Overman

## 2014-01-18 NOTE — Pre-Procedure Instructions (Addendum)
Jon [name]  01/18/2014   Your procedure is scheduled on:  01/19/14  Report to [facility] cone short stay admitting at 530 AM.  Call this number if you have problems the morning of surgery: [phone]   Remember:   Do not eat food or drink liquids after midnight.   Take these medicines the morning of surgery with A SIP OF WATER: INHALER,NEDULIZER,PAIN MED IF NEEDED, ADVAIR, HYDRALAZINE,ISOSORBIDE,PROTONIX  STOP all herbel meds, nsaids (aleve,naproxen,advil,ibuprofen) including vitamins now.   NO INSULIN OR DIABETIC MED DAY OF SURGERY     Do not wear jewelry, make-up or nail polish.  Do not wear lotions, powders, or perfumes. You may wear deodorant.  Do not shave 48 hours prior to surgery. Men may shave face and neck.  Do not bring valuables to the hospital.  [facility] is not responsible                  for any belongings or valuables.               Contacts, dentures or bridgework may not be worn into surgery.  Leave suitcase in the car. After surgery it may be brought to your room.  For patients admitted to the hospital, discharge time is determined by your                treatment team.               Patients discharged the day of surgery will not be allowed to drive  home.  Name and phone number of your driver:   Special Instructions:  Special Instructions: [facility] - Preparing for Surgery  Before surgery, you can play an important role.  Because skin is not sterile, your skin needs to be as free of germs as possible.  You can reduce the number of germs on you skin by washing with CHG (chlorahexidine gluconate) soap before surgery.  CHG is an antiseptic cleaner which kills germs and bonds with the skin to continue killing germs even after washing.  Please DO NOT use if you have an allergy to CHG or antibacterial soaps.  If your skin becomes reddened/irritated stop using the CHG and inform your nurse when you arrive at Short Stay.  Do not shave (including legs and underarms)  for at least 48 hours prior to the first CHG shower.  You may shave your face.  Please follow these instructions carefully:   1.  Shower with CHG Soap the night before surgery and the morning of Surgery.  2.  If you choose to wash your hair, wash your hair first as usual with your normal shampoo.  3.  After you shampoo, rinse your hair and body thoroughly to remove the Shampoo.  4.  Use CHG as you would any other liquid soap.  You can apply chg directly  to the skin and wash gently with scrungie or a clean washcloth.  5.  Apply the CHG Soap to your body ONLY FROM THE NECK DOWN.  Do not use on open wounds or open sores.  Avoid contact with your eyes ears, mouth and genitals (private parts).  Wash genitals (private parts)       with your normal soap.  6.  Wash thoroughly, paying special attention to the area where your surgery will be performed.  7.  Thoroughly rinse your body with warm water from the neck down.  8.  DO NOT shower/wash with your normal soap after using and rinsing  off the CHG Soap.  9.  Pat yourself dry with a clean towel.            10.  Wear clean pajamas.            11.  Place clean sheets on your bed the night of your first shower and do not sleep with pets.  Day of Surgery  Do not apply any lotions/deodorants the morning of surgery.  Please wear clean clothes to the hospital/surgery center.   Please read over the following fact sheets that you were given: Pain Booklet, Coughing and Deep Breathing, Blood Transfusion Information, Open Heart Packet, MRSA Information and Surgical Site Infection Prevention

## 2014-01-18 NOTE — H&P (Cosign Needed)
Name: Jon [name] MRN: [id number] DOB: [birth year]    ADMISSION DATE:  01/18/2014 CONSULTATION DATE:  01/18/14  REFERRING MD :  Dr. Cyndia Bent PRIMARY SERVICE:  PCM   CHIEF COMPLAINT:  Hemoptysis   BRIEF PATIENT DESCRIPTION: 63 y/o M admitted with hemoptysis.  Patient was planned for pericardial window on 5/21.  Upon presentation for pre-eval, he reported onset of hemoptysis am of admit.  PCCM called for evaluation.    SIGNIFICANT EVENTS / STUDIES:  5/20 - admit with hemoptysis at pre-op screening  LINES / TUBES:   CULTURES:   ANTIBIOTICS:   HISTORY OF PRESENT ILLNESS:  63 y/o M, former smoker (quit 2009, smoked 2-3 packs of cigars per day), former ETOH (4 1/2 gallon whiskey per week) with PMH of hypothyroidism, HTN, CVA, RLS, HLD, Chronic AFib, DM on insulin pump, morbid obesity s/p lap band, OSA on nocturnal oxygen / CPAP, chronic pericardial effusion (since 2011) and recent hemoptysis with admit at [facility] from 5/5-5/7 for CAP with related hemoptysis.  Xarelto was discontinued at that time and hemoptysis resolved. Patient reports he felt "really good" until the am of presentation.  He woke with coughing at 0830 and felt terrible. He got up and felt chilled - temp at that time was 99.9.  His am blood sugar was 145.  Cough progressed & he began producing bright red sputum.  He estimates 1/2 to 3/4 cup volume. He was briefly short of breath and used his oxygen with improvement.  He denies chest pain, shortness of breath, n/v/d, abd pain, blood in stool.  He presented to pre-admissions testing for planned pericardial window and informed nursing of above symptoms.  PCCM called for evaluation / admission.   He is a disabled Education officer, museum.  Married, lives with wife & step-daughter.     PAST MEDICAL HISTORY :  Past Medical History  Diagnosis Date  . Hypothyroidism   . Dysrhythmia     atrial fib takes cardizem,   . Hypertension   . Angina   . Stroke 2012  . Restless  leg syndrome   . GERD (gastroesophageal reflux disease)   . Diabetes mellitus     insulin pump   . Hyperlipemia   . H/O hiatal hernia   . Neuromuscular disorder     rls, neuropathy  . Arthritis     right knee  . Obesity (BMI 30-39.9)   . Pericardial effusion   . Sleep apnea     cpap & oxygen  . Asthma   . Pneumonia 5/15    hx of  . Cough     coughing up blood- started last nite, seems to be worse now   Past Surgical History  Procedure Laterality Date  . Hernia repair    . Eye surgery    . Sinus exploration    . Appendectomy    . Cholecystectomy    . Cardiac catheterization  2007  . Laparoscopic gastric banding  2010  . Video bronchoscopy Bilateral 12/29/2013    Procedure: VIDEO BRONCHOSCOPY WITHOUT FLUORO;  Surgeon: Elsie Stain, MD;  Location: [facility];  Service: Endoscopy;  Laterality: Bilateral;   Prior to Admission medications   Medication Sig Start Date End Date Taking? Authorizing Provider  rivaroxaban (XARELTO) 20 MG TABS tablet Take 10 mg by mouth daily with supper.    Yes Historical Provider, MD  albuterol (PROVENTIL HFA;VENTOLIN HFA) 108 (90 BASE) MCG/ACT inhaler Inhale 2 puffs into the lungs every 6 (six) hours as  needed for wheezing or shortness of breath.    Historical Provider, MD  atorvastatin (LIPITOR) 40 MG tablet Take 40 mg by mouth daily.    Historical Provider, MD  azelastine (ASTELIN) 137 MCG/SPRAY nasal spray Place 1 spray into both nostrils at bedtime as needed. Use in each nostril as directed    Historical Provider, MD  Calcium Carbonate-Vitamin D (CALTRATE 600+D PO) Take 2 tablets by mouth 2 (two) times daily.     Historical Provider, MD  diltiazem (CARDIZEM) 30 MG tablet Take 30 mg by mouth 2 (two) times daily.    Historical Provider, MD  fluticasone (FLONASE) 50 MCG/ACT nasal spray Place 1 spray into both nostrils daily.    Historical Provider, MD  Fluticasone-Salmeterol (ADVAIR) 500-50 MCG/DOSE AEPB Inhale 1 puff into the lungs every 12  (twelve) hours.    Historical Provider, MD  hydrALAZINE (APRESOLINE) 50 MG tablet Take 50 mg by mouth 4 (four) times daily.    Historical Provider, MD  HYDROcodone-homatropine (HYCODAN) 5-1.5 MG/5ML syrup Take 5 mLs by mouth every 6 (six) hours as needed for cough. 12/29/13   Elsie Stain, MD  Insulin Human (INSULIN PUMP) 100 unit/ml SOLN Inject into the skin continuous. novolog    Historical Provider, MD  ipratropium-albuterol (DUONEB) 0.5-2.5 (3) MG/3ML SOLN Take 3 mLs by nebulization every 6 (six) hours as needed. For shortness of breath    Historical Provider, MD  isosorbide mononitrate (IMDUR) 60 MG 24 hr tablet Take 60 mg by mouth every morning.    Historical Provider, MD  latanoprost (XALATAN) 0.005 % ophthalmic solution Place 1 drop into both eyes at bedtime.    Historical Provider, MD  losartan (COZAAR) 50 MG tablet Take 1 tablet (50 mg total) by mouth daily. 01/02/14   Elsie Stain, MD  magnesium oxide (MAG-OX) 400 MG tablet Take 1,600 mg by mouth daily.     Historical Provider, MD  metolazone (ZAROXOLYN) 2.5 MG tablet Take 2.5 mg by mouth. On M,W,F 30 minutes before furosemide.    Historical Provider, MD  Multiple Vitamins-Minerals (CENTRUM SILVER PO) Take 1 tablet by mouth daily.    Historical Provider, MD  nitroGLYCERIN (NITROSTAT) 0.4 MG SL tablet Place 0.4 mg under the tongue every 5 (five) minutes as needed. For chest pain    Historical Provider, MD  NOVOLOG 100 UNIT/ML injection As directed via insulin pump    Historical Provider, MD  oxyCODONE-acetaminophen (PERCOCET) 5-325 MG per tablet Take 1 tablet by mouth 4 (four) times daily as needed. For severe pain    Historical Provider, MD  pantoprazole (PROTONIX) 40 MG tablet Take 40 mg by mouth 2 (two) times daily.    Historical Provider, MD  Polyethyl Glycol-Propyl Glycol (SYSTANE PRESERVATIVE FREE) 0.4-0.3 % SOLN Apply 1 drop to eye 4 (four) times daily as needed (dryness of eye).     Historical Provider, MD  potassium chloride  SA (K-DUR,KLOR-CON) 20 MEQ tablet Take 20 mEq by mouth 3 (three) times daily.    Historical Provider, MD  pramipexole (MIRAPEX) 1.5 MG tablet Take 1.5 mg by mouth at bedtime.    Historical Provider, MD  pregabalin (LYRICA) 75 MG capsule Take 1 capsule (75 mg total) by mouth 2 (two) times daily. 01/05/14   Grace Bushy Minor, NP  RELISTOR 12 MG/0.6ML SOLN injection Inject 12 mg into the skin as needed (constipation).     Historical Provider, MD  torsemide (DEMADEX) 20 MG tablet Take 20 mg by mouth 2 (two) times daily.    Historical  Provider, MD  triamcinolone (NASACORT) 55 MCG/ACT nasal inhaler Place 2 sprays into the nose daily.    Historical Provider, MD  zolpidem (AMBIEN) 10 MG tablet Take 10 mg by mouth at bedtime.    Historical Provider, MD   Allergies  Allergen Reactions  . Codeine Itching  . Penicillins Rash    FAMILY HISTORY:  Family History  Problem Relation Age of Onset  . Anesthesia problems Neg Hx   . Hypotension Neg Hx   . Malignant hyperthermia Neg Hx   . Pseudochol deficiency Neg Hx   . Cancer Mother    SOCIAL HISTORY:  reports that he quit smoking about 7 years ago. His smoking use included Cigars. He has never used smokeless tobacco. He reports that he does not drink alcohol. His drug history is not on file.  REVIEW OF SYSTEMS:   Constitutional: Negative for fever, chills, weight loss, malaise/fatigue and diaphoresis.  HENT: Negative for hearing loss, ear pain, nosebleeds, congestion, sore throat, neck pain, tinnitus and ear discharge.   Eyes: Negative for blurred vision, double vision, photophobia, pain, discharge and redness.  Respiratory: Negative for sputum production, shortness of breath, wheezing and stridor. Positive for cough, hemoptysis  Cardiovascular: Negative for chest pain, palpitations, orthopnea, claudication, leg swelling and PND.  Gastrointestinal: Negative for heartburn, nausea, vomiting, abdominal pain, diarrhea, constipation, blood in stool and melena.    Genitourinary: Negative for dysuria, urgency, frequency, hematuria and flank pain.  Musculoskeletal: Negative for myalgias, back pain, joint pain and falls.  Skin: Negative for itching and rash.  Neurological: Negative for dizziness, tingling, tremors, sensory change, speech change, focal weakness, seizures, loss of consciousness, weakness and headaches.  Endo/Heme/Allergies: Negative for environmental allergies and polydipsia. Does not bruise/bleed easily.  SUBJECTIVE:   VITAL SIGNS: Temp:  [99.1 F (37.3 C)] 99.1 F (37.3 C) (05/20 1255) Pulse Rate:  [80] 80 (05/20 1255) Resp:  [18] 18 (05/20 1255) BP: (156)/(50) 156/50 mmHg (05/20 1255) SpO2:  [95 %] 95 % (05/20 1255) Weight:  [257 lb 6 oz (116.745 kg)] 257 lb 6 oz (116.745 kg) (05/20 1255)  PHYSICAL EXAMINATION: General:  Morbidly obese male in NAD Neuro:  AAOx4, speech clear, MAE HEENT:  Mm pink/moist, short / thick neck, long beard Cardiovascular:  s1s2 rrr, no m/r/g Lungs:  resp's even/non-labored, diminished on L, essentially clear on R  Abdomen:  Obese, soft, bsx4 active  Musculoskeletal:  No acute deformities  Skin:  Warm/dry, trace LE edema    Recent Labs Lab 01/18/14 1340  NA 140  K 4.8  CL 104  CO2 23  BUN 15  CREATININE 0.98  GLUCOSE 139*    Recent Labs Lab 01/18/14 1340  HGB 15.3  HCT 45.8  WBC 19.1*  PLT 181   No results found.  ASSESSMENT / PLAN:  Cough  Hemoptysis - recurrent, off anti-coagulants.  No hx of cyclic cough in last week. Recent FOB 4/30 with no endobronchial lesions, negative pathology.  Last CT chest on 5/5.   Former Heavy Tobacco Abuse OSA - uses CPAP QHS Nocturnal Oxygen Use - 2L at baseline R/O HCAP - leukocytosis, cough with hemoptysis.  Plan: -continue nocturnal CPAP with bled in O2 -cough suppression with tussionex -likely will need repeat FOB for airway assessment  -empiric HCAP coverage  -assess PCT  -PRN bronchodilators -trend H/H with hemoptysis    Moderate Pericardial Effusion - planned window for 5/21 HTN HLD Chronic AFib   Plan: -continue lipitor -continue cardizem, hydralazine, imdur, torsemide -hold M/W/F zaroxolyn  -  assess EKG  Diabetes - on insulin pump at home.  D/C upon arrial  Plan: -SSI with resistant scale  -May require basal insulin   Morbid Obesity  S/P Lap Band GERD  Plan: -carb modified diet -pt was scheduled for band filling this week, will need to reschedule   Chronic Pain  RLS Insomnia    Plan: -continue lyrica, ambien, mirapex -continue fentanyl patch for now - not ordered yet (pt has home patch) -PRN Percocet   Noe Gens, NP-C [facility] Pgr: [phone] or [phone]   01/18/2014, 3:00 PM

## 2014-01-18 NOTE — Progress Notes (Signed)
Notified NP of a. Fib rate. No new orders at this time. Syliva Overman

## 2014-01-18 NOTE — Progress Notes (Signed)
Pt. Admitted to 6N12, VSS.  Pt. Said he was instructed to discontinue his insulin pump before surgery and he removed his needle and pump once up in the room.  Will continue to monitor site.  Pt. Has c/o of chest tightness and bilateral arm pain.  EKG done and resulted a. Fib which was also the result of his EKG in short stay.  Awaiting MD orders.  Will continue to monitor closely. Syliva Overman

## 2014-01-18 NOTE — Progress Notes (Addendum)
ANTIBIOTIC CONSULT NOTE - INITIAL  Pharmacy Consult for Vanco/Aztreonam Indication: pneumonia  Allergies  Allergen Reactions  . Codeine Itching  . Penicillins Rash    Patient Measurements: Height: 5\' 8"  (172.7 cm) Weight: 260 lb 2.3 oz (118 kg) IBW/kg (Calculated) : 68.4 Adjusted Body Weight:    Vital Signs: Temp: 98.7 F (37.1 C) (05/20 1415) Temp src: Oral (05/20 1415) BP: 137/75 mmHg (05/20 1415) Pulse Rate: 77 (05/20 1415) Intake/Output from previous day:   Intake/Output from this shift:    Labs:  Recent Labs  01/18/14 1340  WBC 19.1*  HGB 15.3  PLT 181  CREATININE 0.98   Estimated Creatinine Clearance: 93.8 ml/min (by C-G formula based on Cr of 0.98). No results found for this basename: VANCOTROUGH, VANCOPEAK, VANCORANDOM, GENTTROUGH, GENTPEAK, GENTRANDOM, TOBRATROUGH, TOBRAPEAK, TOBRARND, AMIKACINPEAK, AMIKACINTROU, AMIKACIN,  in the last 72 hours   Microbiology: Recent Results (from the past 720 hour(s))  AFB CULTURE WITH SMEAR     Status: None   Collection Time    12/29/13  8:17 AM      Result Value Ref Range Status   Specimen Description BRONCHIAL WASHINGS   Final   Special Requests Normal   Final   Acid Fast Smear     Final   Value: NO ACID FAST BACILLI SEEN     Performed at [facility]   Culture     Final   Value: CULTURE WILL BE EXAMINED FOR 6 WEEKS BEFORE ISSUING A FINAL REPORT     Performed at [facility]   Report Status PENDING   Incomplete  FUNGUS CULTURE W SMEAR     Status: None   Collection Time    12/29/13  8:17 AM      Result Value Ref Range Status   Specimen Description BRONCHIAL WASHINGS   Final   Special Requests Normal   Final   Fungal Smear     Final   Value: NO YEAST OR FUNGAL ELEMENTS SEEN     Performed at [facility]   Culture     Final   Value: CANDIDA ALBICANS     Performed at [facility]   Report Status 01/17/2014 FINAL   Final  LEGIONELLA CULTURE     Status: None   Collection Time     12/29/13  8:17 AM      Result Value Ref Range Status   Specimen Description BRONCHIAL WASHINGS   Final   Special Requests Normal   Final   Culture     Final   Value: NO LEGIONELLA ISOLATED     Performed at [facility]   Report Status 01/03/2014 FINAL   Final  CULTURE, RESPIRATORY (NON-EXPECTORATED)     Status: None   Collection Time    12/29/13  8:17 AM      Result Value Ref Range Status   Specimen Description BRONCHIAL WASHINGS   Final   Special Requests Normal   Final   Gram Stain     Final   Value: ABUNDANT WBC PRESENT,BOTH PMN AND MONONUCLEAR     NO SQUAMOUS EPITHELIAL CELLS SEEN     RARE GRAM POSITIVE COCCI     IN PAIRS     Performed at [facility]   Culture     Final   Value: Non-Pathogenic Oropharyngeal-type Flora Isolated.     Performed at [facility]   Report Status 01/02/2014 FINAL   Final  CULTURE, BLOOD (ROUTINE X 2)  Status: None   Collection Time    01/03/14  5:25 PM      Result Value Ref Range Status   Specimen Description BLOOD LEFT HAND   Final   Special Requests BOTTLES DRAWN AEROBIC AND ANAEROBIC 10CC   Final   Culture  Setup Time     Final   Value: 01/04/2014 01:43     Performed at [facility]   Culture     Final   Value: NO GROWTH 5 DAYS     Performed at [facility]   Report Status 01/10/2014 FINAL   Final  CULTURE, BLOOD (ROUTINE X 2)     Status: None   Collection Time    01/03/14  5:40 PM      Result Value Ref Range Status   Specimen Description BLOOD LEFT ARM   Final   Special Requests BOTTLES DRAWN AEROBIC AND ANAEROBIC 10CC   Final   Culture  Setup Time     Final   Value: 01/04/2014 01:43     Performed at [facility]   Culture     Final   Value: NO GROWTH 5 DAYS     Performed at [facility]   Report Status 01/10/2014 FINAL   Final  CULTURE, EXPECTORATED SPUTUM-ASSESSMENT     Status: None   Collection Time    01/04/14  7:30 AM      Result Value Ref Range Status    Specimen Description SPUTUM   Final   Special Requests Normal   Final   Sputum evaluation     Final   Value: THIS SPECIMEN IS ACCEPTABLE. RESPIRATORY CULTURE REPORT TO FOLLOW.   Report Status 01/04/2014 FINAL   Final  CULTURE, RESPIRATORY (NON-EXPECTORATED)     Status: None   Collection Time    01/04/14  7:30 AM      Result Value Ref Range Status   Specimen Description SPUTUM   Final   Special Requests NONE   Final   Gram Stain     Final   Value: RARE WBC PRESENT,BOTH PMN AND MONONUCLEAR     RARE SQUAMOUS EPITHELIAL CELLS PRESENT     RARE GRAM POSITIVE COCCI IN PAIRS     RARE GRAM POSITIVE RODS     RARE YEAST   Culture     Final   Value: NORMAL OROPHARYNGEAL FLORA     Performed at [facility]   Report Status 01/06/2014 FINAL   Final  SURGICAL PCR SCREEN     Status: None   Collection Time    01/18/14  1:39 PM      Result Value Ref Range Status   MRSA, PCR NEGATIVE  NEGATIVE Final   Staphylococcus aureus NEGATIVE  NEGATIVE Final   Comment:            The Xpert SA Assay (FDA     approved for NASAL specimens     in patients over 26 years of age),     is one component of     a comprehensive surveillance     program.  Test performance has     been validated by [employer] for patients greater     than or equal to 80 year old.     It is not intended     to diagnose infection nor to     guide or monitor treatment.    Medical History: Past Medical History  Diagnosis Date  .  Hypothyroidism   . Dysrhythmia     atrial fib takes cardizem,   . Hypertension   . Angina   . Stroke 2012  . Restless leg syndrome   . GERD (gastroesophageal reflux disease)   . Diabetes mellitus     insulin pump   . Hyperlipemia   . H/O hiatal hernia   . Neuromuscular disorder     rls, neuropathy  . Arthritis     right knee  . Obesity (BMI 30-39.9)   . Pericardial effusion   . Sleep apnea     cpap & oxygen  . Asthma   . Pneumonia 5/15    hx of  . Cough     coughing up  blood- started last nite, seems to be worse now    Medications:  Prescriptions prior to admission  Medication Sig Dispense Refill  . albuterol (PROVENTIL HFA;VENTOLIN HFA) 108 (90 BASE) MCG/ACT inhaler Inhale 2 puffs into the lungs every 6 (six) hours as needed for wheezing or shortness of breath.      Marland Kitchen atorvastatin (LIPITOR) 40 MG tablet Take 40 mg by mouth daily.      Marland Kitchen azelastine (ASTELIN) 137 MCG/SPRAY nasal spray Place 1 spray into both nostrils at bedtime as needed. Use in each nostril as directed      . Calcium Carbonate-Vitamin D (CALTRATE 600+D PO) Take 2 tablets by mouth 2 (two) times daily.       Marland Kitchen diltiazem (CARDIZEM) 30 MG tablet Take 30 mg by mouth 2 (two) times daily.      . fluticasone (FLONASE) 50 MCG/ACT nasal spray Place 1 spray into both nostrils daily.      . Fluticasone-Salmeterol (ADVAIR) 500-50 MCG/DOSE AEPB Inhale 1 puff into the lungs every 12 (twelve) hours.      . hydrALAZINE (APRESOLINE) 50 MG tablet Take 50 mg by mouth 4 (four) times daily.      Marland Kitchen HYDROcodone-homatropine (HYCODAN) 5-1.5 MG/5ML syrup Take 5 mLs by mouth every 6 (six) hours as needed for cough.  120 mL  0  . Insulin Human (INSULIN PUMP) 100 unit/ml SOLN Inject into the skin continuous. novolog      . ipratropium-albuterol (DUONEB) 0.5-2.5 (3) MG/3ML SOLN Take 3 mLs by nebulization every 6 (six) hours as needed. For shortness of breath      . isosorbide mononitrate (IMDUR) 60 MG 24 hr tablet Take 60 mg by mouth every morning.      . latanoprost (XALATAN) 0.005 % ophthalmic solution Place 1 drop into both eyes at bedtime.      Marland Kitchen losartan (COZAAR) 50 MG tablet Take 1 tablet (50 mg total) by mouth daily.  30 tablet  11  . magnesium oxide (MAG-OX) 400 MG tablet Take 1,600 mg by mouth daily.       . metolazone (ZAROXOLYN) 2.5 MG tablet Take 2.5 mg by mouth. On M,W,F 30 minutes before furosemide.      . Multiple Vitamins-Minerals (CENTRUM SILVER PO) Take 1 tablet by mouth daily.      . nitroGLYCERIN  (NITROSTAT) 0.4 MG SL tablet Place 0.4 mg under the tongue every 5 (five) minutes as needed. For chest pain      . NOVOLOG 100 UNIT/ML injection As directed via insulin pump      . oxyCODONE-acetaminophen (PERCOCET) 5-325 MG per tablet Take 1 tablet by mouth 4 (four) times daily as needed. For severe pain      . pantoprazole (PROTONIX) 40 MG tablet Take 40 mg by mouth 2 (  two) times daily.      Vladimir Faster Glycol-Propyl Glycol (SYSTANE PRESERVATIVE FREE) 0.4-0.3 % SOLN Apply 1 drop to eye 4 (four) times daily as needed (dryness of eye).       . potassium chloride SA (K-DUR,KLOR-CON) 20 MEQ tablet Take 20 mEq by mouth 3 (three) times daily.      . pramipexole (MIRAPEX) 1.5 MG tablet Take 1.5 mg by mouth at bedtime.      . pregabalin (LYRICA) 75 MG capsule Take 1 capsule (75 mg total) by mouth 2 (two) times daily.  60 capsule  0  . RELISTOR 12 MG/0.6ML SOLN injection Inject 12 mg into the skin as needed (constipation).       . torsemide (DEMADEX) 20 MG tablet Take 20 mg by mouth 2 (two) times daily.      Marland Kitchen triamcinolone (NASACORT) 55 MCG/ACT nasal inhaler Place 2 sprays into the nose daily.      Marland Kitchen zolpidem (AMBIEN) 10 MG tablet Take 10 mg by mouth at bedtime.      . rivaroxaban (XARELTO) 20 MG TABS tablet Take 10 mg by mouth daily with supper.        Assessment: coughing up blood  63 y/o M with recent CAP c/o ongoing hemoptysis and Xarelto was stopped 5/7. Hemoptysis resolved. Patient was planned to have pericardial window 5/21 but now presents with recurrent hemoptysis. Patient has complex PMH including former heavy tobacco abuse (2-3 ppd) on home O2.   Labs: Scr 0.98, CrCl 93, WBC 19.1 (up), Hgb 15.3, Plts 181  Goal of Therapy:  Vancomycin trough level 15-20 mcg/ml  Plan:  Vancomycin 2000mg  Iv x 1 now then 1000mg  IV q12h. Vanco trough after 3-5 doses at steady state. Aztreonam 2g IV q8hr (PCN allergic)   Charlyne Robertshaw S. Alford Highland, PharmD, [facility] Clinical Staff Pharmacist Pager  [phone]  Wayland Salinas 01/18/2014,6:40 PM

## 2014-01-18 NOTE — Progress Notes (Addendum)
Patient c/o coughing up blood prior to arrival today. Has occurred before ,hospitalized earlier in month. On xarelto but d/c at d/c 01/05/14. Some chest tightness ( not new). Temp 99.1. Consult with allison zelanek pa. Pt instructed to d/c insulin pump for surgery.(by dr Cyndia Bent)   req'd notes ,any tests from dr Lelon Frohlich [facility]'d [facility]  Brandy np wirh pulmonary/critical care working patient up to be admitted to [phone]. Report called to taylor rn.

## 2014-01-19 ENCOUNTER — Encounter (HOSPITAL_COMMUNITY)
Admission: AD | Disposition: A | Discharge status: Home / Self Care | Payer: Self-pay | Source: Ambulatory Visit | Attending: Pulmonary Disease

## 2014-01-19 DIAGNOSIS — E1149 Type 2 diabetes mellitus with other diabetic neurological complication: Secondary | ICD-10-CM

## 2014-01-19 DIAGNOSIS — I319 Disease of pericardium, unspecified: Secondary | ICD-10-CM

## 2014-01-19 LAB — ANCA SCREEN W REFLEX TITER
Atypical p-ANCA Screen: NEGATIVE
c-ANCA Screen: NEGATIVE
p-ANCA Screen: NEGATIVE

## 2014-01-19 LAB — MPO/PR-3 (ANCA) ANTIBODIES
Myeloperoxidase Abs: <1
Serine Protease 3: <1

## 2014-01-19 LAB — BASIC METABOLIC PANEL
BUN: 16 mg/dL (ref 6–23)
CO2: 24 meq/L (ref 19–32)
Calcium: 8.9 mg/dL (ref 8.4–10.5)
Chloride: 103 mEq/L (ref 96–112)
Creatinine, Ser: 1.06 mg/dL (ref 0.50–1.35)
GFR calc Af Amer: 83 mL/min — ABNORMAL LOW (ref >90)
GFR calc non Af Amer: 72 mL/min — ABNORMAL LOW (ref >90)
GLUCOSE: 253 mg/dL — AB (ref 70–99)
POTASSIUM: 5.1 meq/L (ref 3.7–5.3)
Sodium: 140 mEq/L (ref 137–147)

## 2014-01-19 LAB — SEDIMENTATION RATE: Sed Rate: 21 mm/hr — ABNORMAL HIGH (ref 0–16)

## 2014-01-19 LAB — CBC
HEMATOCRIT: 43 % (ref 39.0–52.0)
HEMOGLOBIN: 14.3 g/dL (ref 13.0–17.0)
MCH: 27.6 pg (ref 26.0–34.0)
MCHC: 33.3 g/dL (ref 30.0–36.0)
MCV: 83 fL (ref 78.0–100.0)
Platelets: 145 10*3/uL — ABNORMAL LOW (ref 150–400)
RBC: 5.18 MIL/uL (ref 4.22–5.81)
RDW: 14.4 % (ref 11.5–15.5)
WBC: 12.6 10*3/uL — AB (ref 4.0–10.5)

## 2014-01-19 LAB — ANA: ANA: NEGATIVE

## 2014-01-19 LAB — ANTI-SCLERODERMA ANTIBODY: SCLERODERMA (SCL-70) (ENA) ANTIBODY, IGG: NEGATIVE

## 2014-01-19 LAB — GLUCOSE, CAPILLARY
GLUCOSE-CAPILLARY: 229 mg/dL — AB (ref 70–99)
GLUCOSE-CAPILLARY: 150 mg/dL — AB (ref 70–99)
Glucose-Capillary: 215 mg/dL — ABNORMAL HIGH (ref 70–99)
Glucose-Capillary: 127 mg/dL — ABNORMAL HIGH (ref 70–99)

## 2014-01-19 LAB — CYCLIC CITRUL PEPTIDE ANTIBODY, IGG: Cyclic Citrullin Peptide Ab: <2 U/mL (ref 0.0–5.0)

## 2014-01-19 LAB — RHEUMATOID FACTOR

## 2014-01-19 LAB — GLOMERULAR BASEMENT MEMBRANE ANTIBODIES: GBM Ab: <1

## 2014-01-19 LAB — TROPONIN I: Troponin I: <0.3 ng/mL (ref <0.30)

## 2014-01-19 SURGERY — CREATION, PERICARDIAL WINDOW, SUBXIPHOID APPROACH
Anesthesia: General

## 2014-01-19 MED ORDER — INSULIN ASPART 100 UNIT/ML ~~LOC~~ SOLN
8.0000 [IU] | Freq: Three times a day (TID) | SUBCUTANEOUS | Status: DC
Start: 2014-01-19 — End: 2014-01-24
  Administered 2014-01-19 – 2014-01-23 (×13): 8 [IU] via SUBCUTANEOUS

## 2014-01-19 NOTE — Progress Notes (Addendum)
Patient admitted for pericardial window surgery.  Was seen by coordinator at [facility] on 01/04/14.  Patient was on insulin pump at that time.  Pump has been discontinued on this admission and Novolog RESISTANT correction scale TID and Novolog 4 units meal coverage TID have been ordered.  CBGs OK right now.  May need to be started on glucostabilizer for surgery.  Basal rates on insulin pump as noted on 01/04/14: 12 AM 1.6 units/hr. 6 AM  1.7 units/hr. 12:30 pm  1.7 units/hr. 6:30 pm  2.0 units/hr.     Total basal: 41.85 units CHO ratio: 2 units:15 gms Sensitivity factor=27 Blood glucose target: 110 mg/dl  Will continue to follow while in hospital.  Harvel Ricks RN BSN CDE

## 2014-01-19 NOTE — Progress Notes (Signed)
Subjective:  Still coughing up some bright red blood. Hard to quantify how much but it doesn't sound like a large amount.  Objective: Vital signs in last 24 hours: Temp:  [98.6 F (37 C)-99 F (37.2 C)] 99 F (37.2 C) (05/21 1330) Pulse Rate:  [62-90] 80 (05/21 1330) Cardiac Rhythm:  [-] Atrial fibrillation (05/21 0823) Resp:  [16-20] 18 (05/21 1330) BP: (121-159)/(58-81) 121/58 mmHg (05/21 1343) SpO2:  [95 %-98 %] 98 % (05/21 1330) Weight:  [117.845 kg (259 lb 12.8 oz)] 117.845 kg (259 lb 12.8 oz) (05/21 0500)  Hemodynamic parameters for last 24 hours:    Intake/Output from previous day: 05/20 0701 - 05/21 0700 In: 890 [P.O.:240; I.V.:550; IV Piggyback:100] Out: 1750 [Urine:1750] Intake/Output this shift: Total I/O In: 0  Out: 1000 [Urine:1000]  General appearance: alert and cooperative Heart: regular rate and rhythm, S1, S2 normal, no murmur, click, rub or gallop Lungs: few crackles on the left  Lab Results:  Recent Labs  01/18/14 1340 01/19/14 0502  WBC 19.1* 12.6*  HGB 15.3 14.3  HCT 45.8 43.0  PLT 181 145*   BMET:  Recent Labs  01/18/14 1340 01/19/14 0502  NA 140 140  K 4.8 5.1  CL 104 103  CO2 23 24  GLUCOSE 139* 253*  BUN 15 16  CREATININE 0.98 1.06  CALCIUM 9.5 8.9    PT/INR:  Recent Labs  01/18/14 1340  LABPROT 12.8  INR 0.98   ABG    Component Value Date/Time   PHART 7.464* 01/18/2014 1340   HCO3 25.1* 01/18/2014 1340   TCO2 26.2 01/18/2014 1340   O2SAT 96.7 01/18/2014 1340   CBG (last 3)   Recent Labs  01/18/14 2326 01/19/14 0746 01/19/14 1144  GLUCAP 155* 229* 150*    Assessment/Plan:  He is well-known to me and was scheduled for subxyphoid pericardial window to drain a moderate-sized pericardial effusion this am. He has had chronic shortness of breath and was admitted a few weeks ago with hemoptysis. He was on Xarelto at that time and it was stopped. Chest CT then showed some nodular airspace disease throughout both lungs,  most dense in the left upper and right middle lobes. He had some fever and leukocytosis and was diagnosed and treated for pneumonia with resolution of his hemoptysis. He saw Dr. [name] 2 days ago and was doing well and then yesterday was readmitted after presenting for his preop evaluation and related that he had significant hemoptysis that started again the night before. Chest CT last night shows worsening multilobar airspace disease most severe in the left upper and lower lobes with some reactive mediastinal adenopathy. The pericardial effusion is still present. Review of his old CT scan of the chest in June 2011 when he presented with shortness of breath and hypoxemia showed this same process bilaterally that was worse on the left. He had a bronchoscopy with washings when he was in a few weeks ago and there was no sign of bleeding and no endobronchial lesions seen. Cytology only showed acute inflammation.  I suspect that there must be some underlying pulmonary process and not just a pneumonia. I don't think the pericardial effusion is causing the hemoptysis but could be related to the same process. I think it is probably going to be best to do repeat bronch under anesthesia followed by left VATS for lung biopsy and drainage of the pericardial effusion by pericardial window. This will probably give [place] the best chance of diagnosing a primary pulmonary process  and treating the pericardial effusion at the same time. I discussed this with the patient including alternatives, benefits, and risks and he is in agreement to proceed. I will plan to do this Tuesday unless pulmonary medicine feels that open lung biopsy is not needed.    LOS: 1 day    Gaye Pollack 01/19/2014

## 2014-01-19 NOTE — Progress Notes (Signed)
Placed patient on new CPAP setup with medium mask at 2345. Patient settings are 7cm with 2L bled in. Patient tolerated only for a short time. Patient states he is going to bring in his home unit tomorrow. RT will continue to monitor.

## 2014-01-19 NOTE — Progress Notes (Signed)
Spoke with short stay nurse regarding pt.'s 1000 medicines.  She instructed me on which ones to hold and which to give.  Will continue to monitor. Syliva Overman

## 2014-01-19 NOTE — Progress Notes (Signed)
Patient requested that the RT set up the CPAP for him due to he could put the mask on with no assistance when he got ready to wear the CPAP. RT made filled water chamber and connected the O2 (2L) for the patient.  RT advised patient that if he needed any assistance with the CPAP that he could call. RT will continue to monitor.

## 2014-01-19 NOTE — Progress Notes (Signed)
Pt. Ambulated around unit once and tolerated well.  CCMD notified of tachy rate in 140s.  Pt. Stable with no complaints.  Will continue to monitor closely. Syliva Overman

## 2014-01-19 NOTE — Progress Notes (Signed)
Name: Jon [name]  MRN: [id number]  DOB: 1950-09-02  ADMISSION DATE: 01/18/2014  CONSULTATION DATE: 01/18/14  REFERRING MD : Dr. Cyndia Bent  PRIMARY SERVICE: PCM  CHIEF COMPLAINT: Hemoptysis   HISTORY OF PRESENT ILLNESS: 62 y/o M, former smoker (quit 2009, smoked 2-3 packs of cigars per day), former ETOH (4 1/2 gallon whiskey per week) with PMH of hypothyroidism, HTN, CVA, RLS, HLD, Chronic AFib, DM on insulin pump, morbid obesity s/p lap band, OSA on nocturnal oxygen / CPAP, chronic pericardial effusion (since 2011) and recent hemoptysis with admit at [facility] from 5/5-5/7 for CAP with related hemoptysis. Xarelto was discontinued at that time and hemoptysis resolved.   He presented to pre-admissions testing for planned pericardial window and reported recurrence of hemoptysis for 2 days. He is a disabled Education officer, [employer].   SUBJECTIVE:  Still coughing up blood, he has several tissues, does not appear to be much, reports bright red  Afebrile Denies chest pain  VITAL SIGNS:  Temp: [99.1 F (37.3 C)] 99.1 F (37.3 C) (05/20 1255)  Pulse Rate: [80] 80 (05/20 1255)  Resp: [18] 18 (05/20 1255)  BP: (156)/(50) 156/50 mmHg (05/20 1255)  SpO2: [95 %] 95 % (05/20 1255)  Weight: [257 lb 6 oz (116.745 kg)] 257 lb 6 oz (116.745 kg) (05/20 1255)  PHYSICAL EXAMINATION:  General: Morbidly obese male in NAD  Neuro: AAOx4, speech clear, MAE  HEENT: Mm pink/moist, short / thick neck, long beard  Cardiovascular: s1s2 rrr, no m/r/g  Lungs: resp's even/non-labored, diminished on L, essentially clear on R  Abdomen: Obese, soft, bsx4 active  Musculoskeletal: No acute deformities  Skin: Warm/dry, trace LE edema    PULMONARY  Recent Labs Lab 01/18/14 1340  PHART 7.464*  PCO2ART 35.5  PO2ART 83.9  HCO3 25.1*  TCO2 26.2  O2SAT 96.7    CBC  Recent Labs Lab 01/18/14 1340 01/19/14 0502  HGB 15.3 14.3  HCT 45.8 43.0  WBC 19.1* 12.6*  PLT 181 145*    COAGULATION  Recent  Labs Lab 01/18/14 1340  INR 0.98    CARDIAC    Recent Labs Lab 01/18/14 2150 01/19/14 0502  TROPONINI <0.30 <0.30    Recent Labs Lab 01/18/14 2150  PROBNP 627.2*     CHEMISTRY  Recent Labs Lab 01/18/14 1340 01/19/14 0502  NA 140 140  K 4.8 5.1  CL 104 103  CO2 23 24  GLUCOSE 139* 253*  BUN 15 16  CREATININE 0.98 1.06  CALCIUM 9.5 8.9   Estimated Creatinine Clearance: 86.7 ml/min (by C-G formula based on Cr of 1.06).   LIVER  Recent Labs Lab 01/18/14 1340  AST 20  ALT 31  ALKPHOS 82  BILITOT 1.4*  PROT 6.3  ALBUMIN 3.5  INR 0.98     INFECTIOUS  Recent Labs Lab 01/18/14 1828 01/18/14 2150  LATICACIDVEN  --  1.7  PROCALCITON 0.88 0.86     ENDOCRINE CBG (last 3)   Recent Labs  01/18/14 2326 01/19/14 0746 01/19/14 1144  GLUCAP 155* 229* 150*         IMAGING x48h  Chest 2 View  01/18/2014   CLINICAL DATA:  Pericardial effusion  EXAM: CHEST  2 VIEW  COMPARISON:  01/03/2014  FINDINGS: Cardiac shadow is again enlarged. Increasing infiltrate is noted in the left upper lobe when compare with the prior exam. No sizable effusion is seen. A gastric lap band is noted.  IMPRESSION: Increasing left upper lobe infiltrate.   Electronically Signed  By: Inez Catalina M.D.   On: 01/18/2014 16:06   Ct Chest Wo Contrast  01/18/2014   CLINICAL DATA:  Evaluate infiltrates.  Recurrent hemoptysis.  EXAM: CT CHEST WITHOUT CONTRAST  TECHNIQUE: Multidetector CT imaging of the chest was performed following the standard protocol without IV contrast.  COMPARISON:  Chest CT 01/03/2014.  FINDINGS: Mediastinum: Heart size is borderline enlarged. Small to moderate volume of pericardial fluid, slightly increased compared to the prior examination. No associated pericardial calcifications. There is atherosclerosis of the thoracic aorta, the great vessels of the mediastinum and the coronary arteries, including calcified atherosclerotic plaque in the left anterior  descending and right coronary arteries. Multiple borderline enlarged and mildly enlarged mediastinal lymph nodes, largest of which is prevascular measuring up to 1.1 cm in short axis. Small hiatal hernia. LapBand in position.  Lungs/Pleura: Worsening multifocal peribronchovascular airspace consolidation throughout the lungs bilaterally. This is most significant within the left upper and left lower lobe. No centrally obstructing lesion is identified. No endobronchial lesion is identified. No pleural effusions.  Upper Abdomen: LapBand position with port in the subcutaneous fat of the right upper quadrant of the abdomen. Otherwise, unremarkable.  Musculoskeletal: There are no aggressive appearing lytic or blastic lesions noted in the visualized portions of the skeleton.  IMPRESSION: 1. Progressively worsening multilobar bronchopneumonia, most severe in the left upper lobe and left lower lobe, as above. Reactive mediastinal adenopathy. 2. No definite endobronchial lesion or centrally obstructing lesion identified. 3. Slight increase in small to moderate pericardial effusion. 4. Atherosclerosis, including 2 vessel coronary artery disease. Please note that although the presence of coronary artery calcium documents the presence of coronary artery disease, the severity of this disease and any potential stenosis cannot be assessed on this non-gated CT examination. Assessment for potential risk factor modification, dietary therapy or pharmacologic therapy may be warranted, if clinically indicated. 5. Lap band in position. The proximal portion of the stomach is intrathoracic via a small hiatal hernia.   Electronically Signed   By: Vinnie Langton M.D.   On: 01/18/2014 21:49      ASSESSMENT / PLAN:  Hemoptysis - recurrent, off anti-coagulants. No hx of cyclic cough in last week. Recent FOB 4/30 with no endobronchial lesions, negative pathology. CT chest shows worsening infiltrates compared to 2 weeks ago. Review shows  similar nodular infiltrates in 2011. Of note ANCA neg Former Heavy Tobacco Abuse  OSA - uses CPAP QHS  Nocturnal Oxygen Use - 2L at baseline  R/O HCAP - leukocytosis, cough with hemoptysis.  low pro-calcitonin favers noninfectious cause   Plan:  -continue nocturnal CPAP with bled in O2  -cough suppression with tussionex  -empiric HCAP coverage  -PRN bronchodilators  -trend H/H with hemoptysis  -Discussed with Dr Cyndia Bent, given recurrence of infiltrates off unclear etiology, we'll proceed with open lung biopsy .  Moderate Pericardial Effusion -  HTN  HLD  Chronic AFib  Plan:  -continue lipitor  -continue cardizem, hydralazine, imdur, torsemide  -hold M/W/F zaroxolyn  -Pericardial window will be scheduled  Diabetes - on insulin pump at home. D/C upon arrial  Plan:  -SSI with resistant scale  -8 units thrice daily with meals   Morbid Obesity  S/P Lap Band  GERD   Plan:  -carb modified diet  -pt was scheduled for band filling this week, will need to reschedule   Chronic Pain  RLS  Insomnia  Plan:  -continue lyrica, ambien, mirapex  -continue fentanyl patch for now - not ordered yet (pt  has home patch)  -PRN Percocet    Summary -  doubt infectious etiology, this seems to be alveolar hemorrhage, we'll proceed with open lung biopsy. Not sure how pericardial effusion ties into this, ANCA neg- doubt pulmonary renal syndrome  Kara Mead MD. FCCP. [facility] Pager [phone] If no response call [phone]    01/19/2014 4:52 PM

## 2014-01-20 DIAGNOSIS — J8489 Other specified interstitial pulmonary diseases: Secondary | ICD-10-CM | POA: Diagnosis present

## 2014-01-20 DIAGNOSIS — I4891 Unspecified atrial fibrillation: Secondary | ICD-10-CM

## 2014-01-20 HISTORY — DX: Other specified interstitial pulmonary diseases: J84.89

## 2014-01-20 LAB — CBC
HEMATOCRIT: 39.1 % (ref 39.0–52.0)
Hemoglobin: 12.7 g/dL — ABNORMAL LOW (ref 13.0–17.0)
MCH: 26.9 pg (ref 26.0–34.0)
MCHC: 32.5 g/dL (ref 30.0–36.0)
MCV: 82.8 fL (ref 78.0–100.0)
Platelets: 160 10*3/uL (ref 150–400)
RBC: 4.72 MIL/uL (ref 4.22–5.81)
RDW: 14.5 % (ref 11.5–15.5)
WBC: 8.6 10*3/uL (ref 4.0–10.5)

## 2014-01-20 LAB — GLUCOSE, CAPILLARY
GLUCOSE-CAPILLARY: 221 mg/dL — AB (ref 70–99)
GLUCOSE-CAPILLARY: 227 mg/dL — AB (ref 70–99)
GLUCOSE-CAPILLARY: 155 mg/dL — AB (ref 70–99)
Glucose-Capillary: 124 mg/dL — ABNORMAL HIGH (ref 70–99)

## 2014-01-20 LAB — BASIC METABOLIC PANEL
BUN: 14 mg/dL (ref 6–23)
CALCIUM: 8.9 mg/dL (ref 8.4–10.5)
CHLORIDE: 101 meq/L (ref 96–112)
CO2: 24 mEq/L (ref 19–32)
Creatinine, Ser: 0.99 mg/dL (ref 0.50–1.35)
GFR calc Af Amer: >90 mL/min (ref >90)
GFR calc non Af Amer: 84 mL/min — ABNORMAL LOW (ref >90)
Glucose, Bld: 219 mg/dL — ABNORMAL HIGH (ref 70–99)
Potassium: 4 mEq/L (ref 3.7–5.3)
Sodium: 139 mEq/L (ref 137–147)

## 2014-01-20 LAB — SJOGRENS SYNDROME-A EXTRACTABLE NUCLEAR ANTIBODY: SSA (RO) (ENA) ANTIBODY, IGG: NEGATIVE

## 2014-01-20 LAB — PROCALCITONIN: Procalcitonin: 0.63 ng/mL

## 2014-01-20 LAB — ALDOLASE: ALDOLASE: 7.1 U/L (ref <=8.1)

## 2014-01-20 LAB — SJOGRENS SYNDROME-B EXTRACTABLE NUCLEAR ANTIBODY: SSB (LA) (ENA) ANTIBODY, IGG: NEGATIVE

## 2014-01-20 NOTE — Progress Notes (Addendum)
Name: Jon [name]  MRN: [id number]  DOB: 22-Nov-1950  ADMISSION DATE: 01/18/2014  CONSULTATION DATE: 01/18/14  REFERRING MD : Dr. Cyndia Bent  PRIMARY SERVICE: PCM  CHIEF COMPLAINT: Hemoptysis   Brief summary:  63 y/o M, former smoker (quit 2009, smoked 2-3 packs of cigars per day), former ETOH (4 1/2 gallon whiskey per week) with PMH of hypothyroidism, HTN, CVA, RLS, HLD, Chronic AFib, DM on insulin pump, morbid obesity s/p lap band, OSA on nocturnal oxygen / CPAP, chronic pericardial effusion (since 2011) and recent hemoptysis with admit at [facility] from 5/5-5/7 for CAP with related hemoptysis. Xarelto was discontinued at that time and hemoptysis resolved.   He presented to pre-admissions testing for planned pericardial window and reported recurrence of hemoptysis for 2 days.    SUBJECTIVE:  Notes less blood. Less dyspnea. No chest pain  BP 125/62  Pulse 72  Temp(Src) 98.2 F (36.8 C) (Oral)  Resp 17  Ht 5\' 8"  (1.727 m)  Wt 256 lb 3.2 oz (116.212 kg)  BMI 38.96 kg/m2  SpO2 96%  PHYSICAL EXAMINATION:  General: Morbidly obese male in NAD  Neuro: AAOx4, speech clear, MAE  HEENT: Mm pink/moist, short / thick neck, long beard  Cardiovascular: s1s2 rrr, no m/r/g  Lungs: resp's even/non-labored, diminished on L, essentially clear on R , less rhonchi few exp wheezes Abdomen: Obese, soft, bsx4 active  Musculoskeletal: No acute deformities  Skin: Warm/dry, trace LE edema    PULMONARY  Recent Labs Lab 01/18/14 1340  PHART 7.464*  PCO2ART 35.5  PO2ART 83.9  HCO3 25.1*  TCO2 26.2  O2SAT 96.7    CBC  Recent Labs Lab 01/18/14 1340 01/19/14 0502 01/20/14 0625  HGB 15.3 14.3 12.7*  HCT 45.8 43.0 39.1  WBC 19.1* 12.6* 8.6  PLT 181 145* 160    COAGULATION  Recent Labs Lab 01/18/14 1340  INR 0.98    CARDIAC    Recent Labs Lab 01/18/14 2150 01/19/14 0502  TROPONINI <0.30 <0.30    Recent Labs Lab 01/18/14 2150  PROBNP 627.2*     CHEMISTRY  Recent Labs Lab  01/18/14 1340 01/19/14 0502 01/20/14 0625  NA 140 140 139  K 4.8 5.1 4.0  CL 104 103 101  CO2 23 24 24   GLUCOSE 139* 253* 219*  BUN 15 16 14   CREATININE 0.98 1.06 0.99  CALCIUM 9.5 8.9 8.9   Estimated Creatinine Clearance: 92.1 ml/min (by C-G formula based on Cr of 0.99).   LIVER  Recent Labs Lab 01/18/14 1340  AST 20  ALT 31  ALKPHOS 82  BILITOT 1.4*  PROT 6.3  ALBUMIN 3.5  INR 0.98     INFECTIOUS  Recent Labs Lab 01/18/14 1828 01/18/14 2150 01/20/14 0625  LATICACIDVEN  --  1.7  --   PROCALCITON 0.88 0.86 0.63     ENDOCRINE CBG (last 3)   Recent Labs  01/19/14 1655 01/19/14 2155 01/20/14 0742  GLUCAP 215* 127* 221*         IMAGING x48h  Chest 2 View  01/18/2014   CLINICAL DATA:  Pericardial effusion  EXAM: CHEST  2 VIEW  COMPARISON:  01/03/2014  FINDINGS: Cardiac shadow is again enlarged. Increasing infiltrate is noted in the left upper lobe when compare with the prior exam. No sizable effusion is seen. A gastric lap band is noted.  IMPRESSION: Increasing left upper lobe infiltrate.   Electronically Signed   By: Inez Catalina M.D.   On: 01/18/2014 16:06   Ct Chest Wo Contrast  01/18/2014   CLINICAL DATA:  Evaluate infiltrates.  Recurrent hemoptysis.  EXAM: CT CHEST WITHOUT CONTRAST  TECHNIQUE: Multidetector CT imaging of the chest was performed following the standard protocol without IV contrast.  COMPARISON:  Chest CT 01/03/2014.  FINDINGS: Mediastinum: Heart size is borderline enlarged. Small to moderate volume of pericardial fluid, slightly increased compared to the prior examination. No associated pericardial calcifications. There is atherosclerosis of the thoracic aorta, the great vessels of the mediastinum and the coronary arteries, including calcified atherosclerotic plaque in the left anterior descending and right coronary arteries. Multiple borderline enlarged and mildly enlarged mediastinal lymph nodes, largest of which is prevascular  measuring up to 1.1 cm in short axis. Small hiatal hernia. LapBand in position.  Lungs/Pleura: Worsening multifocal peribronchovascular airspace consolidation throughout the lungs bilaterally. This is most significant within the left upper and left lower lobe. No centrally obstructing lesion is identified. No endobronchial lesion is identified. No pleural effusions.  Upper Abdomen: LapBand position with port in the subcutaneous fat of the right upper quadrant of the abdomen. Otherwise, unremarkable.  Musculoskeletal: There are no aggressive appearing lytic or blastic lesions noted in the visualized portions of the skeleton.  IMPRESSION: 1. Progressively worsening multilobar bronchopneumonia, most severe in the left upper lobe and left lower lobe, as above. Reactive mediastinal adenopathy. 2. No definite endobronchial lesion or centrally obstructing lesion identified. 3. Slight increase in small to moderate pericardial effusion. 4. Atherosclerosis, including 2 vessel coronary artery disease. Please note that although the presence of coronary artery calcium documents the presence of coronary artery disease, the severity of this disease and any potential stenosis cannot be assessed on this non-gated CT examination. Assessment for potential risk factor modification, dietary therapy or pharmacologic therapy may be warranted, if clinically indicated. 5. Lap band in position. The proximal portion of the stomach is intrathoracic via a small hiatal hernia.   Electronically Signed   By: Vinnie Langton M.D.   On: 01/18/2014 21:49      ASSESSMENT / PLAN:  Principal Problem:   Pulmonary alveolar hemorrhage Active Problems:   Hemoptysis   Sleep apnea   Type II or unspecified type diabetes mellitus with neurological manifestations, not stated as uncontrolled   Atrial fibrillation   Pericardial effusion   Hemoptysis - recurrent, off anti-coagulants. No hx of cyclic cough in last week. Recent FOB 4/30 with no  endobronchial lesions, negative pathology. CT chest shows worsening infiltrates compared to 2 weeks ago. Review shows similar nodular infiltrates in 2011. Of note ANCA neg Former Heavy Tobacco Abuse  OSA - uses CPAP QHS  Nocturnal Oxygen Use - 2L at baseline  R/O HCAP - leukocytosis, cough with hemoptysis.  low pro-calcitonin favers noninfectious cause ?other process   Plan:  -best course is OLBx per TCTS -continue nocturnal CPAP with bled in O2  -cough suppression with tussionex  -cont empiric HCAP coverage  -PRN bronchodilators  -keep in house  With iv abx over w/e  Moderate Pericardial Effusion -  HTN  HLD  Chronic AFib  Plan:  -continue lipitor  -continue cardizem, hydralazine, imdur, torsemide  -cont to hold M/W/F zaroxolyn  -Pericardial window will be scheduled for Tuesday, 5/26  Diabetes - on insulin pump at home. D/C upon arrial  Plan:  -SSI with resistant scale  -cont 8 units thrice daily with meals   Morbid Obesity  S/P Lap Band  GERD   Plan:  -carb modified diet >>advance from full liquids -pt was scheduled for band filling this week, will  need to reschedule   Chronic Pain  RLS  Insomnia  Plan:  -continue lyrica, ambien, mirapex  -continue fentanyl patch for now - not ordered yet (pt has home patch)  -PRN Percocet    Summary -  doubt infectious etiology, this seems to be alveolar hemorrhage, we'll proceed with open lung biopsy and Pericardial window 01/24/14 discussed with Dr Cyndia Bent Not sure how pericardial effusion ties into this, ANCA neg- doubt pulmonary renal syndrome  Jon [name]  [id number]  Cell  [phone]  If no response or cell goes to voicemail, call beeper [phone]   01/20/2014 9:56 AM

## 2014-01-20 NOTE — Progress Notes (Signed)
TCTS Progress Note:  Subjective:  Scant hemoptysis today  Objective: Vital signs in last 24 hours: Temp:  [98.2 F (36.8 C)-98.5 F (36.9 C)] 98.2 F (36.8 C) (05/22 0506) Pulse Rate:  [72-73] 72 (05/22 0506) Cardiac Rhythm:  [-] Atrial fibrillation (05/22 0813) Resp:  [17] 17 (05/22 0506) BP: (117-125)/(54-62) 125/62 mmHg (05/22 0506) SpO2:  [96 %-100 %] 96 % (05/22 0842) Weight:  [116.212 kg (256 lb 3.2 oz)] 116.212 kg (256 lb 3.2 oz) (05/22 0505)  Hemodynamic parameters for last 24 hours:    Intake/Output from previous day: 05/21 0701 - 05/22 0700 In: 720 [P.O.:720] Out: 3200 [Urine:3200] Intake/Output this shift: Total I/O In: 360 [P.O.:360] Out: 875 [Urine:875]  General appearance: alert and cooperative Heart: regular rate and rhythm, S1, S2 normal, no murmur, click, rub or gallop Lungs: diminished breath sounds LLL  Lab Results:  Recent Labs  01/19/14 0502 01/20/14 0625  WBC 12.6* 8.6  HGB 14.3 12.7*  HCT 43.0 39.1  PLT 145* 160   BMET:  Recent Labs  01/19/14 0502 01/20/14 0625  NA 140 139  K 5.1 4.0  CL 103 101  CO2 24 24  GLUCOSE 253* 219*  BUN 16 14  CREATININE 1.06 0.99  CALCIUM 8.9 8.9    PT/INR:  Recent Labs  01/18/14 1340  LABPROT 12.8  INR 0.98   ABG    Component Value Date/Time   PHART 7.464* 01/18/2014 1340   HCO3 25.1* 01/18/2014 1340   TCO2 26.2 01/18/2014 1340   O2SAT 96.7 01/18/2014 1340   CBG (last 3)   Recent Labs  01/19/14 2155 01/20/14 0742 01/20/14 1135  GLUCAP 127* 221* 227*    Assessment/Plan:  Case discussed with Dr. [name]. We agree with the need for open lung biopsy to try to figure out why patient is having hemoptysis and nodular infiltrate in the lung. I will plan to do left VATS, lung biopsy and pericardial window on Tuesday afternoon. I will also do bronchoscopy at that time to check the major airways again. I discussed the procedure, alternatives, and risks with the patient including but not limited  to bleeding, blood transfusion, infection, prolonged air leak from the lung, recurrent hemoptysis, recurrent pericardial effusion, and the possibility that we may still not have a firm diagnosis after this procedure. He understands and agrees to proceed.  LOS: 2 days    Gaye Pollack 01/20/2014

## 2014-01-20 NOTE — Progress Notes (Signed)
Pt states he will place himself on CPAP. He has home mask and tubing. He stated he had no questions or concerns with the CPAP. RT encouraged if he needs anything or any questions arise to call. RT will continue to monitor.

## 2014-01-20 NOTE — Progress Notes (Addendum)
ANTIBIOTIC CONSULT NOTE - FOLLOW UP  Pharmacy Consult for Vancomycin + Azactam Indication: r/o HCAP  Allergies  Allergen Reactions  . Codeine Itching  . Penicillins Rash    Patient Measurements: Height: 5\' 8"  (172.7 cm) Weight: 256 lb 3.2 oz (116.212 kg) IBW/kg (Calculated) : 68.4  Vital Signs: Temp: 98.2 F (36.8 C) (05/22 0506) Temp src: Oral (05/22 0506) BP: 125/62 mmHg (05/22 0506) Pulse Rate: 72 (05/22 0506) Intake/Output from previous day: 05/21 0701 - 05/22 0700 In: 720 [P.O.:720] Out: 3200 [Urine:3200] Intake/Output from this shift: Total I/O In: 360 [P.O.:360] Out: -   Labs:  Recent Labs  01/18/14 1340 01/19/14 0502 01/20/14 0625  WBC 19.1* 12.6* 8.6  HGB 15.3 14.3 12.7*  PLT 181 145* 160  CREATININE 0.98 1.06 0.99   Estimated Creatinine Clearance: 92.1 ml/min (by C-G formula based on Cr of 0.99). No results found for this basename: VANCOTROUGH, VANCOPEAK, VANCORANDOM, GENTTROUGH, GENTPEAK, GENTRANDOM, TOBRATROUGH, TOBRAPEAK, TOBRARND, AMIKACINPEAK, AMIKACINTROU, AMIKACIN,  in the last 72 hours   Microbiology: Recent Results (from the past 720 hour(s))  AFB CULTURE WITH SMEAR     Status: None   Collection Time    12/29/13  8:17 AM      Result Value Ref Range Status   Specimen Description BRONCHIAL WASHINGS   Final   Special Requests Normal   Final   Acid Fast Smear     Final   Value: NO ACID FAST BACILLI SEEN     Performed at [facility]   Culture     Final   Value: CULTURE WILL BE EXAMINED FOR 6 WEEKS BEFORE ISSUING A FINAL REPORT     Performed at [facility]   Report Status PENDING   Incomplete  FUNGUS CULTURE W SMEAR     Status: None   Collection Time    12/29/13  8:17 AM      Result Value Ref Range Status   Specimen Description BRONCHIAL WASHINGS   Final   Special Requests Normal   Final   Fungal Smear     Final   Value: NO YEAST OR FUNGAL ELEMENTS SEEN     Performed at [facility]   Culture     Final   Value: CANDIDA ALBICANS     Performed at [facility]   Report Status 01/17/2014 FINAL   Final  LEGIONELLA CULTURE     Status: None   Collection Time    12/29/13  8:17 AM      Result Value Ref Range Status   Specimen Description BRONCHIAL WASHINGS   Final   Special Requests Normal   Final   Culture     Final   Value: NO LEGIONELLA ISOLATED     Performed at [facility]   Report Status 01/03/2014 FINAL   Final  CULTURE, RESPIRATORY (NON-EXPECTORATED)     Status: None   Collection Time    12/29/13  8:17 AM      Result Value Ref Range Status   Specimen Description BRONCHIAL WASHINGS   Final   Special Requests Normal   Final   Gram Stain     Final   Value: ABUNDANT WBC PRESENT,BOTH PMN AND MONONUCLEAR     NO SQUAMOUS EPITHELIAL CELLS SEEN     RARE GRAM POSITIVE COCCI     IN PAIRS     Performed at [facility]   Culture     Final   Value: Non-Pathogenic Oropharyngeal-type Flora Isolated.  Performed at [facility]   Report Status 01/02/2014 FINAL   Final  CULTURE, BLOOD (ROUTINE X 2)     Status: None   Collection Time    01/03/14  5:25 PM      Result Value Ref Range Status   Specimen Description BLOOD LEFT HAND   Final   Special Requests BOTTLES DRAWN AEROBIC AND ANAEROBIC 10CC   Final   Culture  Setup Time     Final   Value: 01/04/2014 01:43     Performed at [facility]   Culture     Final   Value: NO GROWTH 5 DAYS     Performed at [facility]   Report Status 01/10/2014 FINAL   Final  CULTURE, BLOOD (ROUTINE X 2)     Status: None   Collection Time    01/03/14  5:40 PM      Result Value Ref Range Status   Specimen Description BLOOD LEFT ARM   Final   Special Requests BOTTLES DRAWN AEROBIC AND ANAEROBIC 10CC   Final   Culture  Setup Time     Final   Value: 01/04/2014 01:43     Performed at [facility]   Culture     Final   Value: NO GROWTH 5 DAYS     Performed at [facility]   Report Status  01/10/2014 FINAL   Final  CULTURE, EXPECTORATED SPUTUM-ASSESSMENT     Status: None   Collection Time    01/04/14  7:30 AM      Result Value Ref Range Status   Specimen Description SPUTUM   Final   Special Requests Normal   Final   Sputum evaluation     Final   Value: THIS SPECIMEN IS ACCEPTABLE. RESPIRATORY CULTURE REPORT TO FOLLOW.   Report Status 01/04/2014 FINAL   Final  CULTURE, RESPIRATORY (NON-EXPECTORATED)     Status: None   Collection Time    01/04/14  7:30 AM      Result Value Ref Range Status   Specimen Description SPUTUM   Final   Special Requests NONE   Final   Gram Stain     Final   Value: RARE WBC PRESENT,BOTH PMN AND MONONUCLEAR     RARE SQUAMOUS EPITHELIAL CELLS PRESENT     RARE GRAM POSITIVE COCCI IN PAIRS     RARE GRAM POSITIVE RODS     RARE YEAST   Culture     Final   Value: NORMAL OROPHARYNGEAL FLORA     Performed at [facility]   Report Status 01/06/2014 FINAL   Final  SURGICAL PCR SCREEN     Status: None   Collection Time    01/18/14  1:39 PM      Result Value Ref Range Status   MRSA, PCR NEGATIVE  NEGATIVE Final   Staphylococcus aureus NEGATIVE  NEGATIVE Final   Comment:            The Xpert SA Assay (FDA     approved for NASAL specimens     in patients over 25 years of age),     is one component of     a comprehensive surveillance     program.  Test performance has     been validated by [employer] for patients greater     than or equal to 49 year old.     It is not intended     to diagnose  infection nor to     guide or monitor treatment.    Anti-infectives   Start     Dose/Rate Route Frequency Ordered Stop   01/19/14 2200  vancomycin (VANCOCIN) IVPB 1000 mg/200 mL premix     1,000 mg 200 mL/hr over 60 Minutes Intravenous Every 12 hours 01/18/14 1910     01/19/14 0600  vancomycin (VANCOCIN) 1,500 mg in sodium chloride 0.9 % 500 mL IVPB  Status:  Discontinued     1,500 mg 250 mL/hr over 120 Minutes Intravenous On call to O.R.  01/18/14 1406 01/19/14 1250   01/18/14 2000  aztreonam (AZACTAM) 2 g in dextrose 5 % 50 mL IVPB     2 g 100 mL/hr over 30 Minutes Intravenous Every 8 hours 01/18/14 1910     01/18/14 1915  vancomycin (VANCOCIN) 2,000 mg in sodium chloride 0.9 % 500 mL IVPB     2,000 mg 250 mL/hr over 120 Minutes Intravenous NOW 01/18/14 1910 01/18/14 2204      Assessment: 25 YOM who continues on Vancomycin + Azactam for r/o HCAP in the setting of recent hospitalization and worsening multilobar bronchopneumonia seen on Chest CT. Tmax/24h: 99, WBC wnl, SCr 0.99, CrCl~70-80 ml/min (normalized). Dose remains appropriate at this time.  Goal of Therapy:  Vancomycin trough level 15-20 mcg/ml  Plan:  1. Continue Vancomycin 1g IV every 12 hours 2. Continue Azactam 2g IV every 8 hours 3. Will continue to follow renal function, culture results, LOT, and antibiotic de-escalation plans   Alycia Rossetti, PharmD, BCPS Clinical Pharmacist Pager: [phone] 01/20/2014 10:48 AM

## 2014-01-21 DIAGNOSIS — G4730 Sleep apnea, unspecified: Secondary | ICD-10-CM

## 2014-01-21 LAB — GLUCOSE, CAPILLARY
GLUCOSE-CAPILLARY: 196 mg/dL — AB (ref 70–99)
Glucose-Capillary: 172 mg/dL — ABNORMAL HIGH (ref 70–99)
Glucose-Capillary: 165 mg/dL — ABNORMAL HIGH (ref 70–99)
Glucose-Capillary: 190 mg/dL — ABNORMAL HIGH (ref 70–99)

## 2014-01-21 NOTE — Progress Notes (Signed)
Patient to self administer CPAP, which is already set-up with oxygen connected.  Patient is familiar with equipment and procedure and was encouraged to notify RN or RT with any issues.

## 2014-01-21 NOTE — Progress Notes (Signed)
Name: Jon [name]  MRN: [id number]  DOB: 03/20/1951  ADMISSION DATE: 01/18/2014  CONSULTATION DATE: 01/18/14  REFERRING MD : Dr. Cyndia Bent  PRIMARY SERVICE: PCM  CHIEF COMPLAINT: Hemoptysis   Brief summary:  63 y/o M, former smoker (quit 2009, smoked 2-3 packs of cigars per day), former ETOH (4 1/2 gallon whiskey per week) with PMH of hypothyroidism, HTN, CVA, RLS, HLD, Chronic AFib, DM on insulin pump, morbid obesity s/p lap band, OSA on nocturnal oxygen / CPAP, chronic pericardial effusion (since 2011) and recent hemoptysis with admit at [facility] from 5/5-5/7 for CAP with related hemoptysis. Xarelto was discontinued at that time and hemoptysis resolved.   He presented to pre-admissions testing for planned pericardial window and reported recurrence of hemoptysis for 2 days.    SUBJECTIVE:  Still 1 tsp blood/ day with no other new issues. Dyspnea with exertion not much changed  BP 138/79  Pulse 86  Temp(Src) 97.5 F (36.4 C) (Oral)  Resp 16  Ht 5\' 8"  (1.727 m)  Wt 253 lb 9.6 oz (115.032 kg)  BMI 38.57 kg/m2  SpO2 99%  PHYSICAL EXAMINATION:  General: Morbidly obese male in NAD. Examined standing in hallway as he left room to walk hall Neuro: AAOx4, speech clear, MAE  HEENT: Mm pink/moist, short / thick neck, long beard  Cardiovascular: s1s2 rrr, no m/r/g  Lungs: resp's even/non-labored, diminished, no rhonchi or wheeze Abdomen: Obese, soft, bsx4 active  Musculoskeletal: No acute deformities  Skin: Warm/dry, trace LE edema    PULMONARY  Recent Labs Lab 01/18/14 1340  PHART 7.464*  PCO2ART 35.5  PO2ART 83.9  HCO3 25.1*  TCO2 26.2  O2SAT 96.7    CBC  Recent Labs Lab 01/18/14 1340 01/19/14 0502 01/20/14 0625  HGB 15.3 14.3 12.7*  HCT 45.8 43.0 39.1  WBC 19.1* 12.6* 8.6  PLT 181 145* 160    COAGULATION  Recent Labs Lab 01/18/14 1340  INR 0.98    CARDIAC    Recent Labs Lab 01/18/14 2150 01/19/14 0502  TROPONINI <0.30 <0.30    Recent Labs Lab  01/18/14 2150  PROBNP 627.2*     CHEMISTRY  Recent Labs Lab 01/18/14 1340 01/19/14 0502 01/20/14 0625  NA 140 140 139  K 4.8 5.1 4.0  CL 104 103 101  CO2 23 24 24   GLUCOSE 139* 253* 219*  BUN 15 16 14   CREATININE 0.98 1.06 0.99  CALCIUM 9.5 8.9 8.9   Estimated Creatinine Clearance: 91.5 ml/min (by C-G formula based on Cr of 0.99).   LIVER  Recent Labs Lab 01/18/14 1340  AST 20  ALT 31  ALKPHOS 82  BILITOT 1.4*  PROT 6.3  ALBUMIN 3.5  INR 0.98     INFECTIOUS  Recent Labs Lab 01/18/14 1828 01/18/14 2150 01/20/14 0625  LATICACIDVEN  --  1.7  --   PROCALCITON 0.88 0.86 0.63     ENDOCRINE CBG (last 3)   Recent Labs  01/20/14 1655 01/20/14 2124 01/21/14 0759  GLUCAP 124* 155* 196*         IMAGING x48h  No results found.    ASSESSMENT / PLAN:  Principal Problem:   Pulmonary alveolar hemorrhage Active Problems:   Hemoptysis   Sleep apnea   Type II or unspecified type diabetes mellitus with neurological manifestations, not stated as uncontrolled   Atrial fibrillation   Pericardial effusion   Hemoptysis - recurrent, off anti-coagulants. No hx of cyclic cough in last week. Recent FOB 4/30 with no endobronchial lesions, negative pathology. CT  chest shows worsening infiltrates compared to 2 weeks ago. Review shows similar nodular infiltrates in 2011. Of note ANCA neg. He is comfortable with plan for open lung bx and pericardial window Tuesday/ Dr Cyndia Bent. Clinically he may be better- not worse. Burtis Junes this being intra-alveolar hemorrhage. All special vasculitis labs are negative. CXR is pending 5/25. Former Heavy Tobacco Abuse  OSA - uses CPAP QHS  Nocturnal Oxygen Use - 2L at baseline  R/O HCAP - leukocytosis, cough with hemoptysis.  low pro-calcitonin favers noninfectious cause. WBC now down. ?other process   Plan:  -best course is OLBx per TCTS -continue nocturnal CPAP with bled in O2  -cough suppression with tussionex  -cont  empiric HCAP coverage  -PRN bronchodilators  -keep in house  With iv abx over w/e  Moderate Pericardial Effusion -  HTN  HLD  Chronic AFib  Plan:  -continue lipitor  -continue cardizem, hydralazine, imdur, torsemide  -cont to hold M/W/F zaroxolyn  -Pericardial window will be scheduled for Tuesday, 5/26  Diabetes - on insulin pump at home. D/C upon arrival  Plan:  -SSI with resistant scale  -cont 8 units thrice daily with meals   Morbid Obesity  S/P Lap Band  GERD   Plan:  -carb modified diet >>advance from full liquids -pt was scheduled for band filling this week, will need to reschedule   Chronic Pain  RLS  Insomnia  Plan:  -continue lyrica, ambien, mirapex  -continue fentanyl patch for now - not ordered yet (pt has home patch)  -PRN Percocet   From 5/22- still valid: Summary -  doubt infectious etiology, this seems to be alveolar hemorrhage, we'll proceed with open lung biopsy and Pericardial window 01/24/14 discussed with Dr Cyndia Bent Not sure how pericardial effusion ties into this, ANCA neg- doubt pulmonary renal syndrome  Jerene Pitch  [id number]  Cell  [phone]  If no response or cell goes to voicemail, call beeper [phone]   01/21/2014 8:12 AM

## 2014-01-22 LAB — GLUCOSE, CAPILLARY
GLUCOSE-CAPILLARY: 148 mg/dL — AB (ref 70–99)
Glucose-Capillary: 203 mg/dL — ABNORMAL HIGH (ref 70–99)
Glucose-Capillary: 197 mg/dL — ABNORMAL HIGH (ref 70–99)
Glucose-Capillary: 144 mg/dL — ABNORMAL HIGH (ref 70–99)

## 2014-01-22 NOTE — Progress Notes (Signed)
Patient will self administer CPAP therapy.  Patient is familiar with equipment and procedure and is very compliant with CPAP at home and in the hospital.

## 2014-01-22 NOTE — Progress Notes (Signed)
Name: Jon [name]  MRN: [id number]  DOB: [birth year]  ADMISSION DATE: 01/18/2014  CONSULTATION DATE: 01/18/14  REFERRING MD : Dr. Cyndia Bent  PRIMARY SERVICE: PCM  CHIEF COMPLAINT: Hemoptysis   Brief summary:  63 y/o M, former smoker (quit 2009, smoked 2-3 packs of cigars per day), former ETOH (4 1/2 gallon whiskey per week) with PMH of hypothyroidism, HTN, CVA, RLS, HLD, Chronic AFib, DM on insulin pump, morbid obesity s/p lap band, OSA on nocturnal oxygen / CPAP, chronic pericardial effusion (since 2011) and recent hemoptysis with admit at [facility] from 5/5-5/7 for CAP with related hemoptysis. Xarelto was discontinued at that time and hemoptysis resolved.   He presented to pre-admissions testing for planned pericardial window and reported recurrence of hemoptysis for 2 days.    SUBJECTIVE:  Walked a lot yesterday. Has not seen blood in sputum since yesterday.No new c/o.  BP 127/73  Pulse 68  Temp(Src) 97.9 F (36.6 C) (Oral)  Resp 17  Ht 5\' 8"  (1.727 m)  Wt 251 lb 4.7 oz (113.986 kg)  BMI 38.22 kg/m2  SpO2 94%  PHYSICAL EXAMINATION:  General: Morbidly obese male in NAD. Lying in bed with CPAP on, appearing comfortable Neuro: AAOx4, speech clear, MAE  HEENT: Mm pink/moist, short / thick neck, long beard  Cardiovascular:  Irreg/ Irreg/AFib, no m/r/g  Lungs: resp's even/non-labored, diminished, no rhonchi or wheeze Abdomen: Obese, soft, bs present  Musculoskeletal: No acute deformities  Skin: Warm/dry, trace LE edema    PULMONARY  Recent Labs Lab 01/18/14 1340  PHART 7.464*  PCO2ART 35.5  PO2ART 83.9  HCO3 25.1*  TCO2 26.2  O2SAT 96.7    CBC  Recent Labs Lab 01/18/14 1340 01/19/14 0502 01/20/14 0625  HGB 15.3 14.3 12.7*  HCT 45.8 43.0 39.1  WBC 19.1* 12.6* 8.6  PLT 181 145* 160    COAGULATION  Recent Labs Lab 01/18/14 1340  INR 0.98    CARDIAC    Recent Labs Lab 01/18/14 2150 01/19/14 0502  TROPONINI <0.30 <0.30    Recent Labs Lab 01/18/14 2150   PROBNP 627.2*     CHEMISTRY  Recent Labs Lab 01/18/14 1340 01/19/14 0502 01/20/14 0625  NA 140 140 139  K 4.8 5.1 4.0  CL 104 103 101  CO2 23 24 24   GLUCOSE 139* 253* 219*  BUN 15 16 14   CREATININE 0.98 1.06 0.99  CALCIUM 9.5 8.9 8.9   Estimated Creatinine Clearance: 91.1 ml/min (by C-G formula based on Cr of 0.99).   LIVER  Recent Labs Lab 01/18/14 1340  AST 20  ALT 31  ALKPHOS 82  BILITOT 1.4*  PROT 6.3  ALBUMIN 3.5  INR 0.98     INFECTIOUS  Recent Labs Lab 01/18/14 1828 01/18/14 2150 01/20/14 0625  LATICACIDVEN  --  1.7  --   PROCALCITON 0.88 0.86 0.63     ENDOCRINE CBG (last 3)   Recent Labs  01/21/14 1650 01/21/14 2325 01/22/14 0737  GLUCAP 165* 190* 203*     IMAGING x48h  No results found.    ASSESSMENT / PLAN:  Principal Problem:   Pulmonary alveolar hemorrhage Active Problems:   Hemoptysis   Sleep apnea   Type II or unspecified type diabetes mellitus with neurological manifestations, not stated as uncontrolled   Atrial fibrillation   Pericardial effusion   Hemoptysis - recurrent, off anti-coagulants. No hx of cyclic cough in last week. Recent FOB 4/30 with no endobronchial lesions, negative pathology. CT chest shows worsening infiltrates compared to 2 weeks  ago. Review shows similar nodular infiltrates in 2011. Of note ANCA neg. He is comfortable with plan for open lung bx and pericardial window Tuesday/ Dr Cyndia Bent. Clinically he may be better- not worse. Burtis Junes this being intra-alveolar hemorrhage. All special vasculitis labs are negative. CXR is pending 5/25. Former Heavy Tobacco Abuse  OSA - uses CPAP QHS  Nocturnal Oxygen Use - 2L at baseline  R/O HCAP - leukocytosis, cough with hemoptysis.  low pro-calcitonin favors noninfectious cause. WBC now down. ?other process   Plan:  -best course is OLBx and pericardial window  per TCTS- recalculate if CXR 5/25 is substantially improved -continue nocturnal CPAP with bled in  O2  -cough suppression with tussionex  -cont empiric HCAP coverage  -PRN bronchodilators  -keep in house  With iv abx over w/e  Moderate Pericardial Effusion -  HTN  HLD  Chronic AFib  Plan:  -continue lipitor  -continue cardizem, hydralazine, imdur, torsemide  -cont to hold M/W/F zaroxolyn  -Pericardial window will be scheduled for Tuesday, 5/26  Diabetes - on insulin pump at home. D/C upon arrival  Plan:  -SSI with resistant scale  -cont 8 units thrice daily with meals   Morbid Obesity  S/P Lap Band  GERD   Plan:  -carb modified diet >>advance from full liquids -pt was scheduled for band filling this week, will need to reschedule   Chronic Pain  RLS  Insomnia  Plan:  -continue lyrica, ambien, mirapex  -continue fentanyl patch for now - not ordered yet (pt has home patch)  -PRN Percocet   From 5/22- still valid: Summary -  doubt infectious etiology, this seems to be alveolar hemorrhage, we'll proceed with open lung biopsy and Pericardial window 01/24/14 discussed with Dr Cyndia Bent Not sure how pericardial effusion ties into this, ANCA neg- doubt pulmonary renal syndrome  Jerene Pitch  [id number]  Cell  [phone]  If no response or cell goes to voicemail, call beeper [phone]   01/22/2014 8:07 AM

## 2014-01-23 ENCOUNTER — Inpatient Hospital Stay (HOSPITAL_COMMUNITY): Payer: Medicare Other

## 2014-01-23 LAB — CBC
HCT: 44.4 % (ref 39.0–52.0)
HEMOGLOBIN: 15.2 g/dL (ref 13.0–17.0)
MCH: 27.9 pg (ref 26.0–34.0)
MCHC: 34.2 g/dL (ref 30.0–36.0)
MCV: 81.6 fL (ref 78.0–100.0)
Platelets: 220 10*3/uL (ref 150–400)
RBC: 5.44 MIL/uL (ref 4.22–5.81)
RDW: 14.3 % (ref 11.5–15.5)
WBC: 8.1 10*3/uL (ref 4.0–10.5)

## 2014-01-23 LAB — BASIC METABOLIC PANEL
BUN: 19 mg/dL (ref 6–23)
CO2: 29 meq/L (ref 19–32)
Calcium: 9.1 mg/dL (ref 8.4–10.5)
Chloride: 93 mEq/L — ABNORMAL LOW (ref 96–112)
Creatinine, Ser: 1.1 mg/dL (ref 0.50–1.35)
GFR calc Af Amer: 79 mL/min — ABNORMAL LOW (ref >90)
GFR, EST NON AFRICAN AMERICAN: 69 mL/min — AB (ref >90)
GLUCOSE: 204 mg/dL — AB (ref 70–99)
POTASSIUM: 3.6 meq/L — AB (ref 3.7–5.3)
Sodium: 137 mEq/L (ref 137–147)

## 2014-01-23 LAB — VANCOMYCIN, TROUGH: VANCOMYCIN TR: 17.9 ug/mL (ref 10.0–20.0)

## 2014-01-23 LAB — GLUCOSE, CAPILLARY
GLUCOSE-CAPILLARY: 194 mg/dL — AB (ref 70–99)
Glucose-Capillary: 140 mg/dL — ABNORMAL HIGH (ref 70–99)
Glucose-Capillary: 122 mg/dL — ABNORMAL HIGH (ref 70–99)
Glucose-Capillary: 170 mg/dL — ABNORMAL HIGH (ref 70–99)

## 2014-01-23 NOTE — Progress Notes (Signed)
Name: [name]  MRN: [id number]  DOB: [birth year]  ADMISSION DATE: 01/18/2014  CONSULTATION DATE: 01/18/14  REFERRING MD : Dr. Cyndia [name]  PRIMARY SERVICE: PCM  CHIEF COMPLAINT: Hemoptysis   Brief summary:  65 yowm former smoker (quit 2009, smoked 2-3 packs of cigars per day), former ETOH (4 1/2 gallon whiskey per week) with  hypothyroidism, HTN, CVA, RLS, HLD, Chronic AFib, DM on insulin pump, morbid obesity s/p lap band, OSA on nocturnal oxygen / CPAP, chronic pericardial effusion (since 2011) and recent hemoptysis with admit at [facility] from 5/5-5/7 for CAP with related hemoptysis. Xarelto was discontinued at that time and hemoptysis resolved.   He presented to pre-admissions testing for planned pericardial window and reported recurrence of hemoptysis for 2 days.    SUBJECTIVE:  No further hemoptysis/ no cp or sob at rest sitting in chair  BP 131/59  Pulse 82  Temp(Src) 98.1 F (36.7 C) (Oral)  Resp 18  Ht _0  (1.727 m)  Wt 245 lb (111.131 kg)  BMI 37.26 kg/m2  SpO2 98% FIO2  RA  PHYSICAL EXAMINATION:  General: Morbidly obese male in NAD. Comfortable sitting in chair Neuro: AAOx4, speech clear, MAE  HEENT: Mm pink/moist, short / thick neck, long beard  Cardiovascular:  Irreg/ Irreg/AFib, no m/r/g  Lungs: resp's even/non-labored, diminished, no rhonchi or wheeze Abdomen: Obese, soft, bs present  Musculoskeletal: No acute deformities  Skin: Warm/dry, trace LE edema    PULMONARY  Recent Labs Lab 01/18/14 1340  PHART 7.464*  PCO2ART 35.5  PO2ART 83.9  HCO3 25.1*  TCO2 26.2  O2SAT 96.7    CBC  Recent Labs Lab 01/19/14 0502 01/20/14 0625 01/23/14 0745  HGB 14.3 12.7* 15.2  HCT 43.0 39.1 44.4  WBC 12.6* 8.6 8.1  PLT 145* 160 220    COAGULATION  Recent Labs Lab 01/18/14 1340  INR 0.98    CARDIAC    Recent Labs Lab 01/18/14 2150 01/19/14 0502  TROPONINI <0.30 <0.30    Recent Labs Lab 01/18/14 2150  PROBNP 627.2*     CHEMISTRY  Recent  Labs Lab 01/18/14 1340 01/19/14 0502 01/20/14 0625 01/23/14 0745  NA 140 140 139 137  K 4.8 5.1 4.0 3.6*  CL 104 103 101 93*  CO2 _1 GLUCOSE 139* 253* 219* 204*  BUN _2 CREATININE 0.98 1.06 0.99 1.10  CALCIUM 9.5 8.9 8.9 9.1   Estimated Creatinine Clearance: 81 ml/min (by C-G formula based on Cr of 1.1).   LIVER  Recent Labs Lab 01/18/14 1340  AST 20  ALT 31  ALKPHOS 82  BILITOT 1.4*  PROT 6.3  ALBUMIN 3.5  INR 0.98     INFECTIOUS  Recent Labs Lab 01/18/14 1828 01/18/14 2150 01/20/14 0625  LATICACIDVEN  --  1.7  --   PROCALCITON 0.88 0.86 0.63     ENDOCRINE CBG (last 3)   Recent Labs  01/22/14 2157 01/23/14 0802 01/23/14 1245  GLUCAP 144* 194* 140*     IMAGING x48h  Dg Chest 2 View  01/23/2014   CLINICAL DATA:  Recurrent some Office is.  Chest discomfort.  EXAM: CHEST  2 VIEW  COMPARISON:  Radiographs and CT 01/18/2014.  FINDINGS: The heart size and mediastinal contours are stable. Fluctuating bilateral airspace opacities are noted with improvement in the left upper and lower lobe components. There is slight worsening of the right upper lobe component. No significant pleural effusion is identified. Gastric lap band appears grossly stable. There  are no worrisome osseous findings.  IMPRESSION: Fluctuating bilateral infiltrates consistent with pneumonia. Overall, there has been improvement, although there is slight worsening of the right upper lobe component.   Electronically Signed   By: Jon [name] M.D.   On: 01/23/2014 09:20      ASSESSMENT / PLAN:  Principal Problem:   Pulmonary alveolar hemorrhage Active Problems:   Hemoptysis   Sleep apnea   Type II or unspecified type diabetes mellitus with neurological manifestations, not stated as uncontrolled   Atrial fibrillation   Pericardial effusion   Hemoptysis - recurrent, off anti-coagulants. No hx of cyclic cough in last week.  FOB 4/30 with no endobronchial lesions,  negative pathology. CT chest shows worsening infiltrates compared to 2 weeks ago. Review shows similar nodular infiltrates in 2011. Of note ESR nl/ANCA neg. He is comfortable with plan for open lung bx and pericardial window 5/26/ Dr Jon [name]. Clinically he may be better- not worse. Jon [name] this being intra-alveolar hemorrhage. All special vasculitis labs are negative.    Former Heavy Tobacco Abuse  OSA - uses CPAP QHS  Nocturnal Oxygen Use - 2L at baseline  R/O HCAP - leukocytosis, cough with hemoptysis.  low pro-calcitonin favors noninfectious cause. WBC now down. ?other process/ RUL appears to be source and only area not improving ? Underlying lung Ca?  Plan:  -best course is OLBx and pericardial window  per TCTS  -continue nocturnal CPAP with bled in O2  -cough suppression with tussionex  -cont empiric HCAP coverage  -PRN bronchodilators  -keep in house  With iv abx over w/e  Moderate Pericardial Effusion -  HTN  HLD  Chronic AFib  Plan:  -continue lipitor  -continue cardizem, hydralazine, imdur, torsemide  -cont to hold M/W/F zaroxolyn  -Pericardial window  scheduled  5/26  Diabetes - on insulin pump at home. D/C upon arrival  Plan:  -SSI with resistant scale  -cont 8 units thrice daily with meals   Morbid Obesity  S/P Lap Band  GERD   Plan:  -carb modified diet >>advance from full liquids -pt was scheduled for band filling this week, will need to reschedule   Chronic Pain  RLS  Insomnia  Plan:  -continue lyrica, ambien, mirapex  -continue fentanyl patch for now - not ordered yet (pt has home patch)  -PRN Percocet        Jon Gully, MD Pulmonary and [place] [phone] After 5:30 PM or weekends, call [phone]

## 2014-01-23 NOTE — Progress Notes (Signed)
Pt places self on/off cpap/ 

## 2014-01-24 ENCOUNTER — Encounter (HOSPITAL_COMMUNITY)
Admission: AD | Disposition: A | Discharge status: Home / Self Care | Payer: Self-pay | Source: Ambulatory Visit | Attending: Pulmonary Disease

## 2014-01-24 ENCOUNTER — Encounter (HOSPITAL_COMMUNITY): Payer: Medicare Other | Admitting: Vascular Surgery

## 2014-01-24 ENCOUNTER — Inpatient Hospital Stay (HOSPITAL_COMMUNITY): Payer: Medicare Other

## 2014-01-24 ENCOUNTER — Ambulatory Visit: Payer: Medicare Other | Admitting: Critical Care Medicine

## 2014-01-24 ENCOUNTER — Inpatient Hospital Stay (HOSPITAL_COMMUNITY): Payer: Medicare Other | Admitting: Anesthesiology

## 2014-01-24 DIAGNOSIS — R918 Other nonspecific abnormal finding of lung field: Secondary | ICD-10-CM

## 2014-01-24 DIAGNOSIS — J90 Pleural effusion, not elsewhere classified: Secondary | ICD-10-CM

## 2014-01-24 HISTORY — PX: PERICARDIAL WINDOW: SHX2213

## 2014-01-24 HISTORY — PX: VIDEO BRONCHOSCOPY: SHX5072

## 2014-01-24 HISTORY — PX: VIDEO ASSISTED THORACOSCOPY: SHX5073

## 2014-01-24 LAB — GLUCOSE, CAPILLARY
GLUCOSE-CAPILLARY: 176 mg/dL — AB (ref 70–99)
GLUCOSE-CAPILLARY: 167 mg/dL — AB (ref 70–99)
Glucose-Capillary: 137 mg/dL — ABNORMAL HIGH (ref 70–99)
Glucose-Capillary: 170 mg/dL — ABNORMAL HIGH (ref 70–99)
Glucose-Capillary: 191 mg/dL — ABNORMAL HIGH (ref 70–99)
Glucose-Capillary: 193 mg/dL — ABNORMAL HIGH (ref 70–99)
Glucose-Capillary: 216 mg/dL — ABNORMAL HIGH (ref 70–99)

## 2014-01-24 SURGERY — VIDEO ASSISTED THORACOSCOPY
Anesthesia: General

## 2014-01-24 MED ORDER — SUCCINYLCHOLINE CHLORIDE 20 MG/ML IJ SOLN
INTRAMUSCULAR | Status: DC | PRN
  Administered 2014-01-24: 140 mg via INTRAVENOUS

## 2014-01-24 MED ORDER — OXYCODONE HCL 5 MG PO TABS
5.0000 mg | ORAL_TABLET | ORAL | Status: DC | PRN
  Administered 2014-01-25: 5 mg via ORAL
  Administered 2014-01-26 – 2014-01-27 (×3): 10 mg via ORAL
  Filled 2014-01-24: qty 2
  Filled 2014-01-24: qty 1
  Filled 2014-01-24 (×2): qty 2

## 2014-01-24 MED ORDER — ONDANSETRON HCL 4 MG/2ML IJ SOLN
4.0000 mg | Freq: Four times a day (QID) | INTRAMUSCULAR | Status: DC | PRN
  Filled 2014-01-24: qty 2

## 2014-01-24 MED ORDER — OXYCODONE HCL 5 MG/5ML PO SOLN: 5.0000 mg | Freq: Once | ORAL | Status: DC | PRN

## 2014-01-24 MED ORDER — KCL IN DEXTROSE-NACL 20-5-0.45 MEQ/L-%-% IV SOLN
INTRAVENOUS | Status: DC
  Administered 2014-01-24: 100 mL/h via INTRAVENOUS
  Administered 2014-01-25: 07:00:00 via INTRAVENOUS
  Filled 2014-01-24 (×5): qty 1000

## 2014-01-24 MED ORDER — ACETAMINOPHEN 500 MG PO TABS
1000.0000 mg | ORAL_TABLET | Freq: Four times a day (QID) | ORAL | Status: DC
  Administered 2014-01-25 – 2014-01-27 (×7): 1000 mg via ORAL
  Filled 2014-01-24 (×16): qty 2

## 2014-01-24 MED ORDER — PROPOFOL 10 MG/ML IV BOLUS
INTRAVENOUS | Status: AC
  Filled 2014-01-24: qty 20

## 2014-01-24 MED ORDER — HYDROMORPHONE HCL PF 1 MG/ML IJ SOLN
INTRAMUSCULAR | Status: DC | PRN
  Administered 2014-01-24 (×2): 1 mg via INTRAVENOUS

## 2014-01-24 MED ORDER — GLYCOPYRROLATE 0.2 MG/ML IJ SOLN
INTRAMUSCULAR | Status: DC | PRN
  Administered 2014-01-24: 0.4 mg via INTRAVENOUS

## 2014-01-24 MED ORDER — ACETAMINOPHEN 325 MG PO TABS: 325.0000 mg | ORAL_TABLET | ORAL | Status: DC | PRN

## 2014-01-24 MED ORDER — HYDROMORPHONE 0.3 MG/ML IV SOLN
INTRAVENOUS | Status: DC
  Administered 2014-01-24 (×2): via INTRAVENOUS
  Administered 2014-01-24: 2.7 mg via INTRAVENOUS
  Administered 2014-01-25: 3.6 mg via INTRAVENOUS
  Administered 2014-01-25: 3 mg via INTRAVENOUS
  Administered 2014-01-25: 18:00:00 via INTRAVENOUS
  Administered 2014-01-25: 3.3 mg via INTRAVENOUS
  Administered 2014-01-25: 3.6 mg via INTRAVENOUS
  Administered 2014-01-25: 09:00:00 via INTRAVENOUS
  Administered 2014-01-25: 3.6 mg via INTRAVENOUS
  Administered 2014-01-26: 05:00:00 via INTRAVENOUS
  Administered 2014-01-26: 5.08 mg via INTRAVENOUS
  Administered 2014-01-26: 1.8 mg via INTRAVENOUS
  Filled 2014-01-24 (×4): qty 25

## 2014-01-24 MED ORDER — ROCURONIUM BROMIDE 100 MG/10ML IV SOLN
INTRAVENOUS | Status: DC | PRN
  Administered 2014-01-24: 50 mg via INTRAVENOUS

## 2014-01-24 MED ORDER — HYDROMORPHONE HCL PF 1 MG/ML IJ SOLN
INTRAMUSCULAR | Status: AC
  Filled 2014-01-24: qty 1

## 2014-01-24 MED ORDER — HYDROMORPHONE HCL PF 1 MG/ML IJ SOLN
INTRAMUSCULAR | Status: AC
  Administered 2014-01-24: 0.5 mg via INTRAVENOUS
  Filled 2014-01-24: qty 1

## 2014-01-24 MED ORDER — LEVOFLOXACIN IN D5W 750 MG/150ML IV SOLN
750.0000 mg | INTRAVENOUS | Status: AC
  Administered 2014-01-24: 750 mg via INTRAVENOUS
  Filled 2014-01-24: qty 150

## 2014-01-24 MED ORDER — HYDROMORPHONE HCL PF 1 MG/ML IJ SOLN: 0.2500 mg | INTRAMUSCULAR | Status: DC | PRN

## 2014-01-24 MED ORDER — MIDAZOLAM HCL 5 MG/5ML IJ SOLN
INTRAMUSCULAR | Status: DC | PRN
  Administered 2014-01-24: 2 mg via INTRAVENOUS

## 2014-01-24 MED ORDER — NEOSTIGMINE METHYLSULFATE 10 MG/10ML IV SOLN
INTRAVENOUS | Status: DC | PRN
  Administered 2014-01-24: 3 mg via INTRAVENOUS

## 2014-01-24 MED ORDER — POTASSIUM CHLORIDE 10 MEQ/50ML IV SOLN: 10.0000 meq | Freq: Every day | INTRAVENOUS | Status: DC | PRN

## 2014-01-24 MED ORDER — INSULIN ASPART 100 UNIT/ML ~~LOC~~ SOLN
0.0000 [IU] | SUBCUTANEOUS | Status: DC
  Administered 2014-01-24: 12 [IU] via SUBCUTANEOUS
  Administered 2014-01-24 – 2014-01-25 (×4): 4 [IU] via SUBCUTANEOUS
  Administered 2014-01-25: 8 [IU] via SUBCUTANEOUS
  Administered 2014-01-25: 2 [IU] via SUBCUTANEOUS
  Administered 2014-01-25 – 2014-01-26 (×2): 4 [IU] via SUBCUTANEOUS
  Administered 2014-01-26 (×3): 2 [IU] via SUBCUTANEOUS
  Administered 2014-01-26 (×2): 4 [IU] via SUBCUTANEOUS
  Administered 2014-01-27 (×2): 2 [IU] via SUBCUTANEOUS

## 2014-01-24 MED ORDER — FENTANYL CITRATE 0.05 MG/ML IJ SOLN
INTRAMUSCULAR | Status: AC
  Filled 2014-01-24: qty 5

## 2014-01-24 MED ORDER — OXYCODONE HCL 5 MG PO TABS: 5.0000 mg | ORAL_TABLET | Freq: Once | ORAL | Status: DC | PRN

## 2014-01-24 MED ORDER — VANCOMYCIN HCL IN DEXTROSE 1-5 GM/200ML-% IV SOLN
1000.0000 mg | Freq: Two times a day (BID) | INTRAVENOUS | Status: AC
  Administered 2014-01-24: 1000 mg via INTRAVENOUS
  Filled 2014-01-24: qty 200

## 2014-01-24 MED ORDER — 0.9 % SODIUM CHLORIDE (POUR BTL) OPTIME
TOPICAL | Status: DC | PRN
  Administered 2014-01-24: 1000 mL

## 2014-01-24 MED ORDER — BISACODYL 5 MG PO TBEC
10.0000 mg | DELAYED_RELEASE_TABLET | Freq: Every day | ORAL | Status: DC
  Administered 2014-01-24 – 2014-01-27 (×4): 10 mg via ORAL
  Filled 2014-01-24 (×4): qty 2

## 2014-01-24 MED ORDER — FENTANYL CITRATE 0.05 MG/ML IJ SOLN
INTRAMUSCULAR | Status: DC | PRN
  Administered 2014-01-24: 100 ug via INTRAVENOUS
  Administered 2014-01-24: 50 ug via INTRAVENOUS
  Administered 2014-01-24: 100 ug via INTRAVENOUS
  Administered 2014-01-24: 50 ug via INTRAVENOUS
  Administered 2014-01-24 (×2): 100 ug via INTRAVENOUS

## 2014-01-24 MED ORDER — PROPOFOL 10 MG/ML IV BOLUS: INTRAVENOUS | Status: DC | PRN

## 2014-01-24 MED ORDER — PROMETHAZINE HCL 25 MG/ML IJ SOLN: 6.2500 mg | INTRAMUSCULAR | Status: DC | PRN

## 2014-01-24 MED ORDER — HYDROMORPHONE HCL PF 1 MG/ML IJ SOLN
0.2500 mg | INTRAMUSCULAR | Status: DC | PRN
  Administered 2014-01-24 (×4): 0.5 mg via INTRAVENOUS

## 2014-01-24 MED ORDER — DIPHENHYDRAMINE HCL 12.5 MG/5ML PO ELIX
12.5000 mg | ORAL_SOLUTION | Freq: Four times a day (QID) | ORAL | Status: DC | PRN
  Filled 2014-01-24: qty 5

## 2014-01-24 MED ORDER — SODIUM CHLORIDE 0.9 % IJ SOLN: 9.0000 mL | INTRAMUSCULAR | Status: DC | PRN

## 2014-01-24 MED ORDER — LACTATED RINGERS IV SOLN
INTRAVENOUS | Status: DC | PRN
  Administered 2014-01-24: 12:00:00 via INTRAVENOUS

## 2014-01-24 MED ORDER — ENOXAPARIN SODIUM 40 MG/0.4ML ~~LOC~~ SOLN
40.0000 mg | Freq: Every day | SUBCUTANEOUS | Status: DC
  Administered 2014-01-25 – 2014-01-28 (×4): 40 mg via SUBCUTANEOUS
  Filled 2014-01-24 (×4): qty 0.4

## 2014-01-24 MED ORDER — ONDANSETRON HCL 4 MG/2ML IJ SOLN
INTRAMUSCULAR | Status: DC | PRN
  Administered 2014-01-24: 4 mg via INTRAVENOUS

## 2014-01-24 MED ORDER — ACETAMINOPHEN 160 MG/5ML PO SOLN
325.0000 mg | ORAL | Status: DC | PRN
  Filled 2014-01-24: qty 20.3

## 2014-01-24 MED ORDER — ONDANSETRON HCL 4 MG/2ML IJ SOLN: 4.0000 mg | Freq: Four times a day (QID) | INTRAMUSCULAR | Status: DC | PRN

## 2014-01-24 MED ORDER — DIPHENHYDRAMINE HCL 50 MG/ML IJ SOLN
12.5000 mg | Freq: Four times a day (QID) | INTRAMUSCULAR | Status: DC | PRN
  Administered 2014-01-25 – 2014-01-26 (×3): 12.5 mg via INTRAVENOUS
  Filled 2014-01-24 (×3): qty 1
  Filled 2014-01-24: qty 0.25

## 2014-01-24 MED ORDER — HYDROMORPHONE 0.3 MG/ML IV SOLN
INTRAVENOUS | Status: AC
  Administered 2014-01-24: 1.5 mg
  Filled 2014-01-24: qty 25

## 2014-01-24 MED ORDER — SENNOSIDES-DOCUSATE SODIUM 8.6-50 MG PO TABS
1.0000 | ORAL_TABLET | Freq: Every day | ORAL | Status: DC
  Administered 2014-01-24 – 2014-01-27 (×4): 1 via ORAL
  Filled 2014-01-24 (×5): qty 1

## 2014-01-24 MED ORDER — MIDAZOLAM HCL 2 MG/2ML IJ SOLN
INTRAMUSCULAR | Status: AC
  Filled 2014-01-24: qty 2

## 2014-01-24 MED ORDER — PROPOFOL 10 MG/ML IV BOLUS
INTRAVENOUS | Status: DC | PRN
  Administered 2014-01-24: 200 mg via INTRAVENOUS

## 2014-01-24 MED ORDER — ACETAMINOPHEN 160 MG/5ML PO SOLN
1000.0000 mg | Freq: Four times a day (QID) | ORAL | Status: DC
  Filled 2014-01-24: qty 40

## 2014-01-24 MED ORDER — NALOXONE HCL 0.4 MG/ML IJ SOLN
0.4000 mg | INTRAMUSCULAR | Status: DC | PRN
  Filled 2014-01-24: qty 1

## 2014-01-24 SURGICAL SUPPLY — 87 items
ADH SKN CLS APL DERMABOND .7 (GAUZE/BANDAGES/DRESSINGS)
ATTRACTOMAT 16X20 MAGNETIC DRP (DRAPES) ×3 IMPLANT
BALL CTTN LRG ABS STRL LF (GAUZE/BANDAGES/DRESSINGS)
BRUSH CYTOL CELLEBRITY 1.5X140 (MISCELLANEOUS) IMPLANT
CANISTER SUCTION 2500CC (MISCELLANEOUS) ×7 IMPLANT
CATH KIT ON Q 5IN SLV (PAIN MANAGEMENT) IMPLANT
CATH THORACIC 28FR (CATHETERS) IMPLANT
CATH THORACIC 28FR RT ANG (CATHETERS) IMPLANT
CATH THORACIC 36FR (CATHETERS) IMPLANT
CATH THORACIC 36FR RT ANG (CATHETERS) IMPLANT
CLIP TI MEDIUM 24 (CLIP) ×1 IMPLANT
CONT SPEC 4OZ CLIKSEAL STRL BL (MISCELLANEOUS) ×14 IMPLANT
COTTONBALL LRG STERILE PKG (GAUZE/BANDAGES/DRESSINGS) IMPLANT
COVER SURGICAL LIGHT HANDLE (MISCELLANEOUS) ×9 IMPLANT
COVER TABLE BACK 60X90 (DRAPES) ×3 IMPLANT
DERMABOND ADVANCED (GAUZE/BANDAGES/DRESSINGS)
DERMABOND ADVANCED .7 DNX12 (GAUZE/BANDAGES/DRESSINGS) IMPLANT
DRAIN CHANNEL 28F RND 3/8 FF (WOUND CARE) IMPLANT
DRAPE CAMERA VIDEO/LASER (DRAPES) IMPLANT
DRAPE LAPAROSCOPIC ABDOMINAL (DRAPES) ×5 IMPLANT
DRAPE WARM FLUID 44X44 (DRAPE) ×3 IMPLANT
ELECT REM PT RETURN 9FT ADLT (ELECTROSURGICAL) ×3
ELECTRODE REM PT RTRN 9FT ADLT (ELECTROSURGICAL) ×4 IMPLANT
FORCEPS BIOP RJ4 1.8 (CUTTING FORCEPS) IMPLANT
GLOVE EUDERMIC 7 POWDERFREE (GLOVE) ×9 IMPLANT
GOWN STRL REUS W/ TWL LRG LVL3 (GOWN DISPOSABLE) ×4 IMPLANT
GOWN STRL REUS W/ TWL XL LVL3 (GOWN DISPOSABLE) ×4 IMPLANT
GOWN STRL REUS W/TWL LRG LVL3 (GOWN DISPOSABLE) ×6
GOWN STRL REUS W/TWL XL LVL3 (GOWN DISPOSABLE) ×6
HANDLE STAPLE ENDO GIA SHORT (STAPLE) ×1
HEMOSTAT POWDER SURGIFOAM 1G (HEMOSTASIS) IMPLANT
KIT BASIN OR (CUSTOM PROCEDURE TRAY) ×6 IMPLANT
KIT ROOM TURNOVER OR (KITS) ×6 IMPLANT
MARKER SKIN DUAL TIP RULER LAB (MISCELLANEOUS) ×3 IMPLANT
NDL BIOPSY TRANSBRONCH 21G (NEEDLE) IMPLANT
NEEDLE 22X1 1/2 (OR ONLY) (NEEDLE) IMPLANT
NEEDLE BIOPSY TRANSBRONCH 21G (NEEDLE) IMPLANT
NS IRRIG 1000ML POUR BTL (IV SOLUTION) ×9 IMPLANT
OIL SILICONE PENTAX (PARTS (SERVICE/REPAIRS)) ×3 IMPLANT
PACK CHEST (CUSTOM PROCEDURE TRAY) ×6 IMPLANT
PAD ARMBOARD 7.5X6 YLW CONV (MISCELLANEOUS) ×11 IMPLANT
PAD ELECT DEFIB RADIOL ZOLL (MISCELLANEOUS) ×3 IMPLANT
RELOAD EGIA 45 MED/THCK PURPLE (STAPLE) ×2 IMPLANT
RELOAD EGIA 60 MED/THCK PURPLE (STAPLE) ×12 IMPLANT
RELOAD STAPLE 60 MED/THCK ART (STAPLE) IMPLANT
SEALANT SURG COSEAL 4ML (VASCULAR PRODUCTS) IMPLANT
SEALANT SURG COSEAL 8ML (VASCULAR PRODUCTS) IMPLANT
SOLUTION ANTI FOG 6CC (MISCELLANEOUS) ×1 IMPLANT
SPECIMEN JAR MEDIUM (MISCELLANEOUS) ×2 IMPLANT
SPONGE GAUZE 4X4 12PLY (GAUZE/BANDAGES/DRESSINGS) ×6 IMPLANT
SPONGE GAUZE 4X4 12PLY STER LF (GAUZE/BANDAGES/DRESSINGS) ×1 IMPLANT
STAPLER ENDO GIA 12 SHRT THIN (STAPLE) IMPLANT
STAPLER ENDO GIA 12MM SHORT (STAPLE) ×2 IMPLANT
SUT PROLENE 3 0 SH DA (SUTURE) IMPLANT
SUT PROLENE 4 0 RB 1 (SUTURE)
SUT PROLENE 4-0 RB1 .5 CRCL 36 (SUTURE) IMPLANT
SUT SILK  1 MH (SUTURE) ×2
SUT SILK 1 MH (SUTURE) IMPLANT
SUT SILK 2 0SH CR/8 30 (SUTURE) IMPLANT
SUT VIC AB 1 CTX 18 (SUTURE) IMPLANT
SUT VIC AB 1 CTX 36 (SUTURE)
SUT VIC AB 1 CTX36XBRD ANBCTR (SUTURE) IMPLANT
SUT VIC AB 2 TP1 27 (SUTURE) IMPLANT
SUT VIC AB 2-0 CT1 27 (SUTURE)
SUT VIC AB 2-0 CT1 TAPERPNT 27 (SUTURE) IMPLANT
SUT VIC AB 2-0 CTX 36 (SUTURE) IMPLANT
SUT VIC AB 2-0 UR6 27 (SUTURE) IMPLANT
SUT VIC AB 3-0 MH 27 (SUTURE) IMPLANT
SUT VIC AB 3-0 SH 27 (SUTURE)
SUT VIC AB 3-0 SH 27X BRD (SUTURE) IMPLANT
SUT VIC AB 3-0 X1 27 (SUTURE) ×1 IMPLANT
SWAB COLLECTION DEVICE MRSA (MISCELLANEOUS) IMPLANT
SYR 20ML ECCENTRIC (SYRINGE) ×3 IMPLANT
SYR 50ML SLIP (SYRINGE) IMPLANT
SYR 5ML LUER SLIP (SYRINGE) ×3 IMPLANT
SYR CONTROL 10ML LL (SYRINGE) IMPLANT
SYRINGE 10CC LL (SYRINGE) IMPLANT
SYSTEM SAHARA CHEST DRAIN ATS (WOUND CARE) ×3 IMPLANT
TAPE CLOTH SOFT 2X10 (GAUZE/BANDAGES/DRESSINGS) ×1 IMPLANT
TIP APPLICATOR SPRAY EXTEND 16 (VASCULAR PRODUCTS) IMPLANT
TOWEL OR 17X24 6PK STRL BLUE (TOWEL DISPOSABLE) ×6 IMPLANT
TOWEL OR 17X26 10 PK STRL BLUE (TOWEL DISPOSABLE) ×6 IMPLANT
TRAP SPECIMEN MUCOUS 40CC (MISCELLANEOUS) ×3 IMPLANT
TRAY FOLEY CATH 14FRSI W/METER (CATHETERS) ×6 IMPLANT
TUBE ANAEROBIC SPECIMEN COL (MISCELLANEOUS) IMPLANT
TUBE CONNECTING 12X1/4 (SUCTIONS) ×4 IMPLANT
WATER STERILE IRR 1000ML POUR (IV SOLUTION) ×12 IMPLANT

## 2014-01-24 NOTE — Progress Notes (Signed)
S/p wedge resection/ pericardial window  Some incisional pain  BP 139/80  Pulse 80  Temp(Src) 97.4 F (36.3 C) (Oral)  Resp 15  Ht 5\' 8"  (1.727 m)  Wt 244 lb 0.8 oz (110.7 kg)  BMI 37.12 kg/m2  SpO2 100%   Intake/Output Summary (Last 24 hours) at 01/24/14 1800 Last data filed at 01/24/14 1615  Gross per 24 hour  Intake    740 ml  Output   1350 ml  Net   -610 ml    Doing well early postop

## 2014-01-24 NOTE — Anesthesia Preprocedure Evaluation (Addendum)
Anesthesia Evaluation  Patient identified by MRN, date of birth, ID band Patient awake    Reviewed: Allergy & Precautions, H&P , NPO status , Patient's Chart, lab work & pertinent test results  Airway Mallampati: II TM Distance: >3 FB Neck ROM: Full    Dental  (+) Edentulous Upper, Edentulous Lower, Dental Advisory Given   Pulmonary asthma , sleep apnea and Continuous Positive Airway Pressure Ventilation , pneumonia -, COPDformer smoker,          Cardiovascular hypertension, Pt. on medications + Past MI + dysrhythmias Atrial Fibrillation Rhythm:Irregular Rate:Normal  Echo = pericardial effusion, EF 55% to 60%   Neuro/Psych    GI/Hepatic hiatal hernia, GERD-  ,  Endo/Other  diabetes, Type 2, Insulin DependentHypothyroidism   Renal/GU      Musculoskeletal  (+) Fibromyalgia -, narcotic dependent  Abdominal   Peds  Hematology   Anesthesia Other Findings   Reproductive/Obstetrics                          Anesthesia Physical Anesthesia Plan  ASA: III  Anesthesia Plan: General   Post-op Pain Management:    Induction: Intravenous  Airway Management Planned: Double Lumen EBT  Additional Equipment: Arterial line and CVP  Intra-op Plan:   Post-operative Plan: Extubation in OR  Informed Consent: I have reviewed the patients History and Physical, chart, labs and discussed the procedure including the risks, benefits and alternatives for the proposed anesthesia with the patient or authorized representative who has indicated his/her understanding and acceptance.   Dental advisory given  Plan Discussed with: Anesthesiologist and Surgeon  Anesthesia Plan Comments:         Anesthesia Quick Evaluation

## 2014-01-24 NOTE — Op Note (Signed)
CARDIOTHORACIC SURGERY OPERATIVE NOTE:  Jon [name] [id number] 01/24/2014   Preoperative Dx:  Recurrent Hemoptysis with bilateral pulmonary infiltrates, moderate pericardial effusion  Postoperative Dx: Same   Procedure:     1. Flexible video bronchoscopy  2. Left video-assisted thoracoscopy with wedge biopsy of the left upper lobe of lung x 2, pericardial window.  Surgeon: Dr. Gaye Pollack   Assistant: Lars Pinks, PA-C  Anesthesia: GET   Clinical History:   The patient is a 63 year old gentleman with a history of IDDM on an insulin pump, diabetic neuropathy, prior smoking and COPD, prior stroke treated with coumadin and then Xarelto, hypertension, hyperlipidemia, and morbid obesity s/p gastric lap band procedure. He was admitted earlier this month with fever and hemoptysis and was diagnosed with CAP. He had a chest CT which showed a moderate sized pericardial effusion. He had a CT scan of the chest in 2011 which showed a small pericardial effusion. An echo showed a moderate free-flowing pericardial effusion with no evidence of hemodynamic compromise but it show evidence of an elevated CVP. He was treated with antibiotics for his pneumonia and was discharged. His anticoagulation was not restarted. He says he still feels somewhat short of breath at times. He occasionally has some left sided chest discomfort. He says he has had cardiac workup several times before and has had a couple cardiac caths but they were always negative. He saw Dr. [name] 2 days prior to admission and was doing well and then  was readmitted after presenting for his preop evaluation and related that he had significant hemoptysis that started again the night before. Chest CT  showed worsening multilobar airspace disease most severe in the left upper and lower lobes with some reactive mediastinal adenopathy. The pericardial effusion is still present. Review of his old CT scan of the chest in June 2011 when he  presented with shortness of breath and hypoxemia showed this same process bilaterally that was worse on the left. He had a bronchoscopy with washings when he was in a few weeks ago and there was no sign of bleeding and no endobronchial lesions seen. Cytology only showed acute inflammation.   I suspect that there must be some underlying pulmonary process and not just a pneumonia. I don't think the pericardial effusion is causing the hemoptysis but could be related to the same process. I think it is probably going to be best to do repeat bronch under anesthesia followed by left VATS for lung biopsy and drainage of the pericardial effusion by pericardial window. This will probably give [place] the best chance of diagnosing a primary pulmonary process and treating the pericardial effusion at the same time. I discussed the procedure, alternatives, and risks with the patient including but not limited to bleeding, blood transfusion, infection, prolonged air leak from the lung, recurrent hemoptysis, recurrent pericardial effusion, and the possibility that we may still not have a firm diagnosis after this procedure. He understands and agrees to proceed.    Operative procedure:   The patient was seen in the preoperative holding area. The proper patient, proper operative side, proper operation were confirmed after reviewing his history and chest x-ray. The left side of the chest was signed by me. Preoperative intravenous antibiotics were given. He was taken back to the operating room and placed on table in the supine position. After induction of general endotracheal anesthesia using a single-lumen tube, a Foley catheter was placed in the bladder using sterile technique. Lower extremity sequential compression  devices were used. A timeout was taken and the proper patient and proper operation were confirmed with nursing and anesthesia staff. The flexible video bronchoscopy was performed. The distal trachea was normal. The main  carina was sharp. The right and left bronchial tree had normal segmental anatomy. There was some thick purulent-appearing secretions present throughout the bronchi on both sides that was suctioned and sent for culture. There was no blood or even blood-tinged secretions. There were no endobronchial lesions and no sign of extrinsic compression. The bronchoscope was removed and the single-lumen tube converted to a double-lumen tube by anesthesiology. The patient was positioned in the right lateral decubitus position with the left side up. The left side of the chest was prepped with Betadine soap and solution and draped in the usual sterile manner. A timeout was taken and the proper patient, proper operation, and proper operative side were confirmed with nursing and anesthesia staff. A 1 cm incision was made in the midaxillary line at about the eighth intercostal space. The left pleural space was entered bluntly with a hemostat and an 8 mm trocar was inserted. The 30 thoracoscope was inserted and the pleural space inspected.  There was no fluid. The visceral and parietal pleura appeared unremarkable. The pericardial cavity did appear distended with fluid. The a 2nd 1 cm incision was made in the anterior axillary line at the 5th ICS and a 3rd incision in the posterior axillary line at the 4th ICS. Then a wedge biopsy was performed of the inferior lingular segment of the upper lobe and another wedge biopsy of the anterior segment of the left upper lobe. These were sent to pathology for examination and a vasculitis work-up requested. The specimens were also sent for routine, fungus and AFB stain and culture. These biopsies were performed using staplers. Then a pericardial window was performed. A small opening was made in the pericardium laterally using a hook-scissor. Then 500 cc of yellow serous fluid was drained that was sent to cytology. A small window was created using 3 firings of a stapler. The pericardial specimen  was sent to pathology. Hemostasis was complete.   Then a 42 French chest tube was placed through the mid-axillary incision.This was fixed to the skin with silk sutures. The lung was reinflated under direct vision and I did not see any significant air leak. Hemostasis appeared adequate. The posterior axillary incision was then closed in layers using a 2-0 Vicryl subcutaneous suture and 3-0 Vicryl subcuticular skin closure. The anterior incision was closed in a similar manner. The chest tube was connected to Pleur-evac suction. The sponge needle and instrument counts were correct according to the scrub nurse. The patient was then turned into the supine position, extubated, and transported to the post anesthesia care unit in satisfactory and stable condition.

## 2014-01-24 NOTE — Progress Notes (Signed)
ANTIBIOTIC CONSULT NOTE - FOLLOW UP  Pharmacy Consult for Vancomycin + Azactam Indication: r/o HCAP  Allergies  Allergen Reactions  . Codeine Itching  . Penicillins Rash   Patient Measurements: Height: 5\' 8"  (172.7 cm) Weight: 245 lb (111.131 kg) IBW/kg (Calculated) : 68.4  Vital Signs: Temp: 98.6 F (37 C) (05/25 2141) Temp src: Oral (05/25 2141) BP: 130/61 mmHg (05/25 2141) Pulse Rate: 90 (05/25 2141) Intake/Output from previous day: 05/25 0701 - 05/26 0700 In: 640 [P.O.:240; IV Piggyback:400] Out: 1850 [Urine:1850] Intake/Output from this shift: Total I/O In: -  Out: 250 [Urine:250]  Labs:  Recent Labs  01/23/14 0745  WBC 8.1  HGB 15.2  PLT 220  CREATININE 1.10   Estimated Creatinine Clearance: 81 ml/min (by C-G formula based on Cr of 1.1).  Recent Labs  01/23/14 2218  VANCOTROUGH 17.9    Microbiology: Recent Results (from the past 720 hour(s))  AFB CULTURE WITH SMEAR     Status: None   Collection Time    12/29/13  8:17 AM      Result Value Ref Range Status   Specimen Description BRONCHIAL WASHINGS   Final   Special Requests Normal   Final   Acid Fast Smear     Final   Value: NO ACID FAST BACILLI SEEN     Performed at [facility]   Culture     Final   Value: CULTURE WILL BE EXAMINED FOR 6 WEEKS BEFORE ISSUING A FINAL REPORT     Performed at [facility]   Report Status PENDING   Incomplete  FUNGUS CULTURE W SMEAR     Status: None   Collection Time    12/29/13  8:17 AM      Result Value Ref Range Status   Specimen Description BRONCHIAL WASHINGS   Final   Special Requests Normal   Final   Fungal Smear     Final   Value: NO YEAST OR FUNGAL ELEMENTS SEEN     Performed at [facility]   Culture     Final   Value: CANDIDA ALBICANS     Performed at [facility]   Report Status 01/17/2014 FINAL   Final  LEGIONELLA CULTURE     Status: None   Collection Time    12/29/13  8:17 AM      Result Value Ref Range Status    Specimen Description BRONCHIAL WASHINGS   Final   Special Requests Normal   Final   Culture     Final   Value: NO LEGIONELLA ISOLATED     Performed at [facility]   Report Status 01/03/2014 FINAL   Final  CULTURE, RESPIRATORY (NON-EXPECTORATED)     Status: None   Collection Time    12/29/13  8:17 AM      Result Value Ref Range Status   Specimen Description BRONCHIAL WASHINGS   Final   Special Requests Normal   Final   Gram Stain     Final   Value: ABUNDANT WBC PRESENT,BOTH PMN AND MONONUCLEAR     NO SQUAMOUS EPITHELIAL CELLS SEEN     RARE GRAM POSITIVE COCCI     IN PAIRS     Performed at [facility]   Culture     Final   Value: Non-Pathogenic Oropharyngeal-type Flora Isolated.     Performed at [facility]   Report Status 01/02/2014 FINAL   Final  CULTURE, BLOOD (ROUTINE X 2)  Status: None   Collection Time    01/03/14  5:25 PM      Result Value Ref Range Status   Specimen Description BLOOD LEFT HAND   Final   Special Requests BOTTLES DRAWN AEROBIC AND ANAEROBIC 10CC   Final   Culture  Setup Time     Final   Value: 01/04/2014 01:43     Performed at [facility]   Culture     Final   Value: NO GROWTH 5 DAYS     Performed at [facility]   Report Status 01/10/2014 FINAL   Final  CULTURE, BLOOD (ROUTINE X 2)     Status: None   Collection Time    01/03/14  5:40 PM      Result Value Ref Range Status   Specimen Description BLOOD LEFT ARM   Final   Special Requests BOTTLES DRAWN AEROBIC AND ANAEROBIC 10CC   Final   Culture  Setup Time     Final   Value: 01/04/2014 01:43     Performed at [facility]   Culture     Final   Value: NO GROWTH 5 DAYS     Performed at [facility]   Report Status 01/10/2014 FINAL   Final  CULTURE, EXPECTORATED SPUTUM-ASSESSMENT     Status: None   Collection Time    01/04/14  7:30 AM      Result Value Ref Range Status   Specimen Description SPUTUM   Final   Special Requests Normal    Final   Sputum evaluation     Final   Value: THIS SPECIMEN IS ACCEPTABLE. RESPIRATORY CULTURE REPORT TO FOLLOW.   Report Status 01/04/2014 FINAL   Final  CULTURE, RESPIRATORY (NON-EXPECTORATED)     Status: None   Collection Time    01/04/14  7:30 AM      Result Value Ref Range Status   Specimen Description SPUTUM   Final   Special Requests NONE   Final   Gram Stain     Final   Value: RARE WBC PRESENT,BOTH PMN AND MONONUCLEAR     RARE SQUAMOUS EPITHELIAL CELLS PRESENT     RARE GRAM POSITIVE COCCI IN PAIRS     RARE GRAM POSITIVE RODS     RARE YEAST   Culture     Final   Value: NORMAL OROPHARYNGEAL FLORA     Performed at [facility]   Report Status 01/06/2014 FINAL   Final  SURGICAL PCR SCREEN     Status: None   Collection Time    01/18/14  1:39 PM      Result Value Ref Range Status   MRSA, PCR NEGATIVE  NEGATIVE Final   Staphylococcus aureus NEGATIVE  NEGATIVE Final   Comment:            The Xpert SA Assay (FDA     approved for NASAL specimens     in patients over 2 years of age),     is one component of     a comprehensive surveillance     program.  Test performance has     been validated by [employer] for patients greater     than or equal to 20 year old.     It is not intended     to diagnose infection nor to     guide or monitor treatment.    Anti-infectives   Start     Dose/Rate Route  Frequency Ordered Stop   01/19/14 2200  vancomycin (VANCOCIN) IVPB 1000 mg/200 mL premix     1,000 mg 200 mL/hr over 60 Minutes Intravenous Every 12 hours 01/18/14 1910     01/19/14 0600  vancomycin (VANCOCIN) 1,500 mg in sodium chloride 0.9 % 500 mL IVPB  Status:  Discontinued     1,500 mg 250 mL/hr over 120 Minutes Intravenous On call to O.R. 01/18/14 1406 01/19/14 1250   01/18/14 2000  aztreonam (AZACTAM) 2 g in dextrose 5 % 50 mL IVPB     2 g 100 mL/hr over 30 Minutes Intravenous Every 8 hours 01/18/14 1910     01/18/14 1915  vancomycin (VANCOCIN) 2,000 mg in  sodium chloride 0.9 % 500 mL IVPB     2,000 mg 250 mL/hr over 120 Minutes Intravenous NOW 01/18/14 1910 01/18/14 2204     Assessment: 63 YOM who continues on Vancomycin + Azactam for r/o HCAP in the setting of recent hospitalization and worsening multilobar bronchopneumonia seen on Chest CT. Tmax/24h: 99, WBC wnl, SCr 0.99, CrCl~70-80 ml/min (normalized). Dose remains appropriate at this time.  5/26 Vancomycin trough 17.35mcg/ml - within desired goal range.  Goal of Therapy:  Vancomycin trough level 15-20 mcg/ml  Plan:  1. Continue Vancomycin 1g IV every 12 hours 2. Continue Azactam 2g IV every 8 hours 3. Will continue to follow renal function, culture results, LOT, and antibiotic de-escalation plans   Rober Minion, PharmD., MS Clinical Pharmacist Pager:  [phone] Thank you for allowing pharmacy to be part of this patients care team. 01/24/2014 12:27 AM

## 2014-01-24 NOTE — Progress Notes (Signed)
Cardiothoracic Surgery    Pt did well over the weekend with no further hemoptysis. CXR yesterday shows persistent infiltrate bilaterally. Plan to proceed with bronchoscopy followed by VATS with lung biopsy and pericardial window. Pt has no further questions.

## 2014-01-24 NOTE — Brief Op Note (Signed)
01/18/2014 - 01/24/2014  3:04 PM  PATIENT:  Jon [name]  63 y.o. male  PRE-OPERATIVE DIAGNOSIS:  pericardial effusion, Recurrent Hemoptysis with bilateral lung infiltrate  POST-OPERATIVE DIAGNOSIS:  same  PROCEDURE:  Procedure(s) with comments: VIDEO ASSISTED THORACOSCOPY (Left) - VATS/open lung biopsy PERICARDIAL WINDOW (Left) VIDEO BRONCHOSCOPY (N/A)  SURGEON:  Surgeon(s) and Role: Panel 1:    * Gaye Pollack, MD - Primary  Panel 2:    * Gaye Pollack, MD - Primary  PHYSICIAN ASSISTANT: Lars Pinks, PA-C    ANESTHESIA:   general  EBL:  Total I/O In: -  Out: 375 [Urine:375]  BLOOD ADMINISTERED:none  DRAINS: 1 46F  Chest Tube(s) in the left pleural space   LOCAL MEDICATIONS USED:  NONE  SPECIMEN:  Source of Specimen:  1. Bronchial washings for culture  2. Wedge biopsy of LUL x 2    3. pericardial fluid for cytology  4. pericardium  DISPOSITION OF SPECIMEN:  PATHOLOGY  COUNTS:  YES  DICTATION: .Note written in EPIC  PLAN OF CARE: Admit to inpatient   PATIENT DISPOSITION:  PACU - hemodynamically stable.   Delay start of Pharmacological VTE agent (>24hrs) due to surgical blood loss or risk of bleeding: yes

## 2014-01-24 NOTE — Anesthesia Procedure Notes (Addendum)
Procedure Name: Intubation Date/Time: 01/24/2014 12:48 PM Performed by: Manuela Schwartz B Pre-anesthesia Checklist: Patient identified, Emergency Drugs available, Suction available, Patient being monitored and Timeout performed Patient Re-evaluated:Patient Re-evaluated prior to inductionOxygen Delivery Method: Circle system utilized Preoxygenation: Pre-oxygenation with 100% oxygen Intubation Type: IV induction and Rapid sequence Laryngoscope Size: Mac and 3 Grade View: Grade I Tube type: Oral Tube size: 8.5 mm Number of attempts: 1 Airway Equipment and Method: Stylet Placement Confirmation: ETT inserted through vocal cords under direct vision,  positive ETCO2 and breath sounds checked- equal and bilateral Secured at: 22 cm Tube secured with: Tape Dental Injury: Teeth and Oropharynx as per pre-operative assessment     Procedure Name: Intubation Date/Time: 01/24/2014 1:07 PM Performed by: Manuela Schwartz B Laryngoscope Size: Mac and 3 Grade View: Grade I Endobronchial tube: Left, Double lumen EBT, EBT position confirmed by auscultation and EBT position confirmed by fiberoptic bronchoscope and 39 Fr Airway Equipment and Method: Stylet Placement Confirmation: ETT inserted through vocal cords under direct vision,  positive ETCO2 and breath sounds checked- equal and bilateral Tube secured with: Tape Dental Injury: Teeth and Oropharynx as per pre-operative assessment     Right IJ CVL placed under US guidance ASA monitors, Time out, Sterile prep and drape, Anatomy Identified with [place], Vascular access obtained, CVL placed over wire via Seldinger technique All ports aspirated and free flowing Sutured and secured.

## 2014-01-24 NOTE — Progress Notes (Signed)
Name: Jon [name]  MRN: [id number]  DOB: [birth year]  ADMISSION DATE: 01/18/2014  CONSULTATION DATE: 01/18/14  REFERRING MD : Dr. Cyndia Bent  PRIMARY SERVICE: PCM  CHIEF COMPLAINT: Hemoptysis   Brief summary:  66 yowm former smoker (quit 2009, smoked 2-3 packs of cigars per day), former ETOH (4 1/2 gallon whiskey per week) with  hypothyroidism, HTN, CVA, RLS, HLD, Chronic AFib, DM on insulin pump, morbid obesity s/p lap band, OSA on nocturnal oxygen / CPAP, chronic pericardial effusion (since 2011) and recent hemoptysis with admit at [facility] from 5/5-5/7 for CAP with related hemoptysis. Xarelto was discontinued at that time and hemoptysis resolved.   He presented to pre-admissions testing for planned pericardial window and reported recurrence of hemoptysis for 2 days.    SUBJECTIVE:  No hemoptysis.  No new issues.  For pericardial window and OLBx this PM   BP 145/78  Pulse 82  Temp(Src) 98 F (36.7 C) (Oral)  Resp 18  Ht _0  (1.727 m)  Wt 110.7 kg (244 lb 0.8 oz)  BMI 37.12 kg/m2  SpO2 95% FIO2  RA  PHYSICAL EXAMINATION:  General: Morbidly obese male in NAD. Comfortable sitting in chair Neuro: AAOx4, speech clear, MAE  HEENT: Mm pink/moist, short / thick neck, long beard  Cardiovascular:  Irreg/ Irreg/AFib, no m/r/g  Lungs: resp's even/non-labored, diminished, no rhonchi or wheeze Abdomen: Obese, soft, bs present  Musculoskeletal: No acute deformities  Skin: Warm/dry, trace LE edema    PULMONARY  Recent Labs Lab 01/18/14 1340  PHART 7.464*  PCO2ART 35.5  PO2ART 83.9  HCO3 25.1*  TCO2 26.2  O2SAT 96.7    CBC  Recent Labs Lab 01/19/14 0502 01/20/14 0625 01/23/14 0745  HGB 14.3 12.7* 15.2  HCT 43.0 39.1 44.4  WBC 12.6* 8.6 8.1  PLT 145* 160 220    COAGULATION  Recent Labs Lab 01/18/14 1340  INR 0.98    CARDIAC    Recent Labs Lab 01/18/14 2150 01/19/14 0502  TROPONINI <0.30 <0.30    Recent Labs Lab 01/18/14 2150  PROBNP 627.2*      CHEMISTRY  Recent Labs Lab 01/18/14 1340 01/19/14 0502 01/20/14 0625 01/23/14 0745  NA 140 140 139 137  K 4.8 5.1 4.0 3.6*  CL 104 103 101 93*  CO2 _1 GLUCOSE 139* 253* 219* 204*  BUN _2 CREATININE 0.98 1.06 0.99 1.10  CALCIUM 9.5 8.9 8.9 9.1   Estimated Creatinine Clearance: 80.8 ml/min (by C-G formula based on Cr of 1.1).   LIVER  Recent Labs Lab 01/18/14 1340  AST 20  ALT 31  ALKPHOS 82  BILITOT 1.4*  PROT 6.3  ALBUMIN 3.5  INR 0.98     INFECTIOUS  Recent Labs Lab 01/18/14 1828 01/18/14 2150 01/20/14 0625  LATICACIDVEN  --  1.7  --   PROCALCITON 0.88 0.86 0.63     ENDOCRINE CBG (last 3)   Recent Labs  01/23/14 1655 01/23/14 2143 01/24/14 0757  GLUCAP 122* 170* 193*     IMAGING x48h  Dg Chest 2 View  01/23/2014   CLINICAL DATA:  Recurrent some Office is.  Chest discomfort.  EXAM: CHEST  2 VIEW  COMPARISON:  Radiographs and CT 01/18/2014.  FINDINGS: The heart size and mediastinal contours are stable. Fluctuating bilateral airspace opacities are noted with improvement in the left upper and lower lobe components. There is slight worsening of the right upper lobe component. No significant pleural effusion is identified. Gastric lap  band appears grossly stable. There are no worrisome osseous findings.  IMPRESSION: Fluctuating bilateral infiltrates consistent with pneumonia. Overall, there has been improvement, although there is slight worsening of the right upper lobe component.   Electronically Signed   By: Camie Patience M.D.   On: 01/23/2014 09:20      ASSESSMENT / PLAN:  Principal Problem:   Pulmonary alveolar hemorrhage Active Problems:   Hemoptysis   Sleep apnea   Type II or unspecified type diabetes mellitus with neurological manifestations, not stated as uncontrolled   Atrial fibrillation   Pericardial effusion   Hemoptysis - recurrent, off anti-coagulants. No hx of cyclic cough in last week.  FOB 4/30 with  no endobronchial lesions, negative pathology. CT chest shows worsening infiltrates compared to 2 weeks ago. Review shows similar nodular infiltrates in 2011. Of note ESR nl/ANCA neg. He is comfortable with plan for open lung bx and pericardial window 5/26/ Dr Cyndia Bent. Clinically he may be better- not worse. Burtis Junes this being intra-alveolar hemorrhage. All special vasculitis labs are negative.    Former Heavy Tobacco Abuse  OSA - uses CPAP QHS  Nocturnal Oxygen Use - 2L at baseline  R/O HCAP - leukocytosis, cough with hemoptysis. resolving  low pro-calcitonin favors noninfectious cause. WBC now down. ?other process/ RUL appears to be source and only area not improving ? Underlying lung Ca?  Plan:  -best course is OLBx and pericardial window  per TCTS  -continue nocturnal CPAP with bled in O2  -cough suppression with tussionex  -d/c ABX, has had full course of Rx  -PRN bronchodilators   Moderate Pericardial Effusion -  HTN  HLD  Chronic AFib  Plan:  -continue lipitor  -continue cardizem, hydralazine, imdur, torsemide  -cont to hold M/W/F zaroxolyn  -Pericardial window  scheduled  5/26  Diabetes - on insulin pump at home. D/C upon arrival  Plan:  -SSI with resistant scale  -cont 8 units thrice daily with meals >>give only 4units AM 5/26 as is NPO  Morbid Obesity  S/P Lap Band  GERD   Plan:  -carb modified diet -pt was scheduled for band filling this week, will need to reschedule   Chronic Pain  RLS  Insomnia  Plan:  -continue lyrica, ambien, mirapex  -continue fentanyl patch for now - not ordered yet (pt has home patch)  -PRN Percocet        Jon [name]  [phone]  Cell  [phone]  If no response or cell goes to voicemail, call beeper [phone] 8:28 AM 01/24/2014

## 2014-01-24 NOTE — Progress Notes (Signed)
Pt placed on CPAP at this time auto due to unaware of home settings. Pt placed on FFM per Home reg, Placed on min pressure of 4cm H2o and max 20cm H2o. No complications at this time. RT will continue to monitor.

## 2014-01-24 NOTE — Transfer of Care (Signed)
Immediate Anesthesia Transfer of Care Note  Patient: Jon [name]  Procedure(s) Performed: Procedure(s) with comments: VIDEO ASSISTED THORACOSCOPY (Left) - VATS/open lung biopsy PERICARDIAL WINDOW (Left) VIDEO BRONCHOSCOPY (N/A)  Patient Location: PACU  Anesthesia Type:General  Level of Consciousness: awake, alert , oriented and patient cooperative  Airway & Oxygen Therapy: Patient Spontanous Breathing and Patient connected to nasal cannula oxygen  Post-op Assessment: Report given to PACU RN, Post -op Vital signs reviewed and stable and Patient moving all extremities  Post vital signs: Reviewed and stable  Complications: No apparent anesthesia complications

## 2014-01-25 ENCOUNTER — Inpatient Hospital Stay (HOSPITAL_COMMUNITY): Payer: Medicare Other

## 2014-01-25 LAB — COMPREHENSIVE METABOLIC PANEL
ALK PHOS: 79 U/L (ref 39–117)
ALT: 23 U/L (ref 0–53)
AST: 25 U/L (ref 0–37)
Albumin: 3.1 g/dL — ABNORMAL LOW (ref 3.5–5.2)
BILIRUBIN TOTAL: 1.4 mg/dL — AB (ref 0.3–1.2)
BUN: 24 mg/dL — AB (ref 6–23)
CHLORIDE: 96 meq/L (ref 96–112)
CO2: 27 meq/L (ref 19–32)
Calcium: 9.1 mg/dL (ref 8.4–10.5)
Creatinine, Ser: 1.37 mg/dL — ABNORMAL HIGH (ref 0.50–1.35)
GFR, EST AFRICAN AMERICAN: 61 mL/min — AB (ref >90)
GFR, EST NON AFRICAN AMERICAN: 53 mL/min — AB (ref >90)
Glucose, Bld: 190 mg/dL — ABNORMAL HIGH (ref 70–99)
POTASSIUM: 4.1 meq/L (ref 3.7–5.3)
SODIUM: 137 meq/L (ref 137–147)
TOTAL PROTEIN: 6.6 g/dL (ref 6.0–8.3)

## 2014-01-25 LAB — GLUCOSE, CAPILLARY
GLUCOSE-CAPILLARY: 165 mg/dL — AB (ref 70–99)
GLUCOSE-CAPILLARY: 167 mg/dL — AB (ref 70–99)
GLUCOSE-CAPILLARY: 167 mg/dL — AB (ref 70–99)
GLUCOSE-CAPILLARY: 222 mg/dL — AB (ref 70–99)
Glucose-Capillary: 174 mg/dL — ABNORMAL HIGH (ref 70–99)

## 2014-01-25 LAB — CBC
HCT: 46.5 % (ref 39.0–52.0)
Hemoglobin: 15.5 g/dL (ref 13.0–17.0)
MCH: 27.8 pg (ref 26.0–34.0)
MCHC: 33.3 g/dL (ref 30.0–36.0)
MCV: 83.5 fL (ref 78.0–100.0)
Platelets: 212 10*3/uL (ref 150–400)
RBC: 5.57 MIL/uL (ref 4.22–5.81)
RDW: 14.4 % (ref 11.5–15.5)
WBC: 17.9 10*3/uL — ABNORMAL HIGH (ref 4.0–10.5)

## 2014-01-25 NOTE — Progress Notes (Signed)
Patient ID: Jon [name], male   DOB: [birth year], 63 y.o.   MRN: [id number]  SICU evening rounds:  BP has been borderline this evening. Will hold antihypertensives and diuretic this evening. He has been walking today. Minimal chest tube output but still draining some fluid around the tube.

## 2014-01-25 NOTE — Progress Notes (Signed)
Inpatient Diabetes Program Recommendations  AACE/ADA: New Consensus Statement on Inpatient Glycemic Control (2013)  Target Ranges:  Prepandial:   less than 140 mg/dL      Peak postprandial:   less than 180 mg/dL (1-2 hours)      Critically ill patients:  140 - 180 mg/dL   Reason for Assessment:  Results for JAKEB, CAMEJO (MRN [id number]) as of 01/25/2014 12:56  Ref. Range 01/24/2014 19:51 01/24/2014 23:53 01/25/2014 03:59 01/25/2014 07:14 01/25/2014 11:25  Glucose-Capillary Latest Range: 70-99 mg/dL 216 (H) 137 (H) 165 (H) 167 (H) 167 (H)   Diabetes history: Type 2 diabetes Outpatient Diabetes medications: Insulin pump (note that patient's total basal dose was 41 units daily) Current orders for Inpatient glycemic control: TCTS q 4 hours  Consider adding Levemir 20 units daily, Reduce Novolog correction to moderate tid with meals, and add Novolog meal coverage 4 units tid with meals since patient now on PO diet.   Likely will need titration.    Adah Perl, RN, BC-ADM Inpatient Diabetes Coordinator Pager [phone]

## 2014-01-25 NOTE — Progress Notes (Signed)
Name: Jon [name]  MRN: [id number]  DOB: 1951/07/01  ADMISSION DATE: 01/18/2014  CONSULTATION DATE: 01/18/14  REFERRING MD : Dr. Cyndia Bent   CHIEF COMPLAINT: Hemoptysis   BRIEF SUMMARY:   63 yo former smoker admitted to [facility] from 5/05 to 5/07 for CAP and hemoptysis.  He is on xarelto for A fib.  He has hx of pericardial effusion.  Presented for pericardial window 5/20 and developed recurrent hemoptysis.  PCCM consulted.  Followed by Dr. [name] as outpt.  SIGNIFICANT EVENTS: 5/20 Admit 5/26 Lt VATS lung bx, pericardial window  STUDIES: CT chest 5/20 >> moderate pericardial fluid, multifocal ASD b/l  CULTURES: BAL 5/26 >> Lung tissue 5/26 >> BAL AFB 5/26 >>   ANTIBIOTICS: Aztreonam 5/20 >> 5/25 Vancomycin 5/20 >> 5/26 Levaquin 5/26 >>   SUBJECTIVE:  Mild soreness at chest tube site.  No cough.  OBJECTIVE: Temp:  [97.4 F (36.3 C)-99.3 F (37.4 C)] 99.1 F (37.3 C) (05/27 0716) Pulse Rate:  [45-114] 100 (05/27 0700) Resp:  [8-25] 17 (05/27 0700) BP: (98-160)/(53-96) 101/69 mmHg (05/27 0700) SpO2:  [87 %-100 %] 92 % (05/27 0700) Arterial Line BP: (105-206)/(50-82) 105/50 mmHg (05/27 0700) Weight:  [243 lb 8 oz (110.451 kg)] 243 lb 8 oz (110.451 kg) (05/27 0657)  PHYSICAL EXAMINATION:  General: no distress Neuro: alert, normal strength HEENT: no sinus tenderness Cardiovascular: irregular Lungs: no wheeze Abdomen: soft, non tender Musculoskeletal: no edema Skin: no rashes  CBC Recent Labs     01/23/14  0745  01/25/14  0415  WBC  8.1  17.9*  HGB  15.2  15.5  HCT  44.4  46.5  PLT  220  212   BMET Recent Labs     01/23/14  0745  01/25/14  0415  NA  137  137  K  3.6*  4.1  CL  93*  96  CO2  29  27  BUN  19  24*  CREATININE  1.10  1.37*  GLUCOSE  204*  190*    Electrolytes Recent Labs     01/23/14  0745  01/25/14  0415  CALCIUM  9.1  9.1   Liver Enzymes Recent Labs     01/25/14  0415  AST  25  ALT  23  ALKPHOS  79  BILITOT  1.4*  ALBUMIN   3.1*   Glucose Recent Labs     01/24/14  1522  01/24/14  1827  01/24/14  1951  01/24/14  2353  01/25/14  0359  01/25/14  0714  GLUCAP  191*  167*  216*  137*  165*  167*    Imaging Dg Chest Port 1 View  01/25/2014   CLINICAL DATA:  Status post video-assisted thoracoscopy.  EXAM: PORTABLE CHEST - 1 VIEW  COMPARISON:  01/24/2014  FINDINGS: There is new opacity in the left lung base mostly obscuring the left hemidiaphragm. This is likely a combination of atelectasis and pleural fluid. A rapidly involving infiltrate is possible.  Central vascular congestion and patchy areas of bilateral irregular interstitial densities are stable.  Left chest tube tip lies at the left apex, unchanged. No pneumothorax.  Right internal jugular central venous line is stable with its in the lower superior vena cava.  IMPRESSION: New left lung base opacity common developing since the previous day's study. This is likely combination of atelectasis and pleural fluid. Pneumonia or aspiration pneumonitis is possible.   Electronically Signed   By: Lajean Manes M.D.   On: 01/25/2014 07:37  Dg Chest Port 1 View  01/24/2014   CLINICAL DATA:  Line placement, vats  EXAM: PORTABLE CHEST - 1 VIEW  COMPARISON:  01/23/2014  FINDINGS: Right central line is in place with the tip in the SVC. Left chest tube is in place. No pneumothorax.  Bilateral areas of atelectasis. Heart is mildly enlarged. Subcutaneous air noted in the left chest wall.  IMPRESSION: Left chest tube and right central line as described above. No pneumothorax. Small amount of left subcutaneous emphysema.   Electronically Signed   By: Rolm Baptise M.D.   On: 01/24/2014 15:48       ASSESSMENT / PLAN:   A: Recurrent hemoptysis with pulmonary infiltrates. S/p Lt VATS 5/26. P: Post-op care, chest tubes per TCTS F/u biopsy results  A: Hx of smoking. Hx of OSA/OHS. P: CPAP with O2 at night Continue dulera, duoneb  A: Pericardial effusion s/p window  5/26. Hx of HTN, A fib, hyperlipidemia. P: Per TCTS  A: DM - insulin pump at home. P: SSI  A: Hx of GERD. Morbid obesity s/p lap band. P: Nutrition per TCTS  A: Chronic Pain, RLS, Insomnia. P:  Continue lyrica, ambien, mirapex  Post-op pain control per TCTS  [name], MD [place] 01/25/2014, 8:57 AM Pager:  [phone] After 3pm call: [phone]

## 2014-01-25 NOTE — Anesthesia Postprocedure Evaluation (Signed)
  Anesthesia Post-op Note  Patient: Jon [name]  Procedure(s) Performed: Procedure(s) with comments: VIDEO ASSISTED THORACOSCOPY (Left) - VATS/open lung biopsy PERICARDIAL WINDOW (Left) VIDEO BRONCHOSCOPY (N/A)  Patient Location: PACU  Anesthesia Type:General  Level of Consciousness: awake and alert   Airway and Oxygen Therapy: Patient Spontanous Breathing and Patient connected to nasal cannula oxygen  Post-op Pain: mild  Post-op Assessment: Post-op Vital signs reviewed, Patient's Cardiovascular Status Stable, Respiratory Function Stable, Patent Airway, No signs of Nausea or vomiting and Pain level controlled  Post-op Vital Signs: Reviewed and stable  Last Vitals:  Filed Vitals:   01/25/14 1100  BP: 111/54  Pulse: 99  Temp:   Resp:     Complications: No apparent anesthesia complications

## 2014-01-25 NOTE — Progress Notes (Addendum)
      [address]       [place] [address]             [phone]      1 Day Post-Op Procedure(s) (LRB): VIDEO ASSISTED THORACOSCOPY (Left) PERICARDIAL WINDOW (Left) VIDEO BRONCHOSCOPY (N/A)  Subjective:  Jon [name] has some minor discomfort.  He states he mainly feels pressure especially with deep breathing.  Objective: Vital signs in last 24 hours: Temp:  [97.4 F (36.3 C)-99.3 F (37.4 C)] 99.1 F (37.3 C) (05/27 0716) Pulse Rate:  [45-114] 100 (05/27 0700) Cardiac Rhythm:  [-] Atrial fibrillation (05/27 0400) Resp:  [8-25] 17 (05/27 0700) BP: (98-160)/(53-96) 101/69 mmHg (05/27 0700) SpO2:  [87 %-100 %] 95 % (05/27 0821) Arterial Line BP: (105-206)/(50-82) 105/50 mmHg (05/27 0700) Weight:  [243 lb 8 oz (110.451 kg)] 243 lb 8 oz (110.451 kg) (05/27 0657)  Intake/Output from previous day: 05/26 0701 - 05/27 0700 In: 2100 [I.V.:1750; IV Piggyback:350] Out: 1285 [Urine:1060; Chest Tube:225]  General appearance: alert, cooperative and no distress Heart: regular rate and rhythm Lungs: diminished breath sounds LLL Abdomen: soft, non-tender; bowel sounds normal; no masses,  no organomegaly Wound: clean, leakage of serous drainage around chest tube site  Lab Results:  Recent Labs  01/23/14 0745 01/25/14 0415  WBC 8.1 17.9*  HGB 15.2 15.5  HCT 44.4 46.5  PLT 220 212   BMET:  Recent Labs  01/23/14 0745 01/25/14 0415  NA 137 137  K 3.6* 4.1  CL 93* 96  CO2 29 27  GLUCOSE 204* 190*  BUN 19 24*  CREATININE 1.10 1.37*  CALCIUM 9.1 9.1    PT/INR: No results found for this basename: LABPROT, INR,  in the last 72 hours ABG    Component Value Date/Time   PHART 7.464* 01/18/2014 1340   HCO3 25.1* 01/18/2014 1340   TCO2 26.2 01/18/2014 1340   O2SAT 96.7 01/18/2014 1340   CBG (last 3)   Recent Labs  01/24/14 2353 01/25/14 0359 01/25/14 0714  GLUCAP 137* 165* 167*    Assessment/Plan: S/P Procedure(s) (LRB): VIDEO ASSISTED  THORACOSCOPY (Left) PERICARDIAL WINDOW (Left) VIDEO BRONCHOSCOPY (N/A)  1. CV- tachy this morning, blood pressure controlled- on home regimen of Imdur, Hydralazine, Cozaar, Cardizem 2. Pulm- wean oxygen as tolerated, CXR with increased atelectasis vs.effusion vs. Opacification- continue respiratory treatements, IS- patient is without cough 3. Renal- creatinine elevated at 1.37, will monitor weight is below baseline on Demadex 4. ID- + low grade temp this morning, +leukocytosis- will follow, with questionable opacity on CXR may require ABX 5. Chest tube- 250 cc output, leave in place otday 6. Dispo- patient stable, will d/c arterial line, decrease IV fluids, advance diet as tolerated   LOS: 7 days    Jon [name] 01/25/2014   Chart reviewed, patient examined, agree with above. I suspect that most of the opacity at left base is atelectasis. There could be some pleural fluid since he is draining around the chest tube. Will keep chest tube to suction today. Change dressing as needed. He is in rate controlled A-fib this am and has a history of A-fib. He can't be anticoagulated with his history of hemoptysis.

## 2014-01-26 ENCOUNTER — Encounter (HOSPITAL_COMMUNITY): Payer: Self-pay | Admitting: Surgery

## 2014-01-26 ENCOUNTER — Inpatient Hospital Stay (HOSPITAL_COMMUNITY): Payer: Medicare Other

## 2014-01-26 DIAGNOSIS — J449 Chronic obstructive pulmonary disease, unspecified: Secondary | ICD-10-CM

## 2014-01-26 DIAGNOSIS — K219 Gastro-esophageal reflux disease without esophagitis: Secondary | ICD-10-CM

## 2014-01-26 DIAGNOSIS — N179 Acute kidney failure, unspecified: Secondary | ICD-10-CM | POA: Diagnosis not present

## 2014-01-26 DIAGNOSIS — I10 Essential (primary) hypertension: Secondary | ICD-10-CM

## 2014-01-26 LAB — GLUCOSE, CAPILLARY
GLUCOSE-CAPILLARY: 125 mg/dL — AB (ref 70–99)
GLUCOSE-CAPILLARY: 130 mg/dL — AB (ref 70–99)
GLUCOSE-CAPILLARY: 152 mg/dL — AB (ref 70–99)
Glucose-Capillary: 169 mg/dL — ABNORMAL HIGH (ref 70–99)
Glucose-Capillary: 170 mg/dL — ABNORMAL HIGH (ref 70–99)
Glucose-Capillary: 187 mg/dL — ABNORMAL HIGH (ref 70–99)
Glucose-Capillary: 150 mg/dL — ABNORMAL HIGH (ref 70–99)

## 2014-01-26 LAB — CBC
HCT: 41.1 % (ref 39.0–52.0)
Hemoglobin: 13.1 g/dL (ref 13.0–17.0)
MCH: 27.2 pg (ref 26.0–34.0)
MCHC: 31.9 g/dL (ref 30.0–36.0)
MCV: 85.4 fL (ref 78.0–100.0)
PLATELETS: 183 10*3/uL (ref 150–400)
RBC: 4.81 MIL/uL (ref 4.22–5.81)
RDW: 14.7 % (ref 11.5–15.5)
WBC: 10.7 10*3/uL — AB (ref 4.0–10.5)

## 2014-01-26 LAB — URINALYSIS, ROUTINE W REFLEX MICROSCOPIC
GLUCOSE, UA: NEGATIVE mg/dL
HGB URINE DIPSTICK: NEGATIVE
Ketones, ur: NEGATIVE mg/dL
Nitrite: NEGATIVE
PH: 5 (ref 5.0–8.0)
Protein, ur: NEGATIVE mg/dL
Specific Gravity, Urine: 1.021 (ref 1.005–1.030)
UROBILINOGEN UA: 0.2 mg/dL (ref 0.0–1.0)

## 2014-01-26 LAB — CULTURE, RESPIRATORY

## 2014-01-26 LAB — BASIC METABOLIC PANEL
BUN: 35 mg/dL — ABNORMAL HIGH (ref 6–23)
CALCIUM: 9.4 mg/dL (ref 8.4–10.5)
CHLORIDE: 95 meq/L — AB (ref 96–112)
CO2: 27 mEq/L (ref 19–32)
Creatinine, Ser: 2.28 mg/dL — ABNORMAL HIGH (ref 0.50–1.35)
GFR calc non Af Amer: 28 mL/min — ABNORMAL LOW (ref >90)
GFR, EST AFRICAN AMERICAN: 33 mL/min — AB (ref >90)
Glucose, Bld: 205 mg/dL — ABNORMAL HIGH (ref 70–99)
Potassium: 4.3 mEq/L (ref 3.7–5.3)
SODIUM: 135 meq/L — AB (ref 137–147)

## 2014-01-26 LAB — SODIUM, URINE, RANDOM

## 2014-01-26 LAB — URINE MICROSCOPIC-ADD ON

## 2014-01-26 LAB — CULTURE, RESPIRATORY W GRAM STAIN

## 2014-01-26 LAB — CREATININE, URINE, RANDOM: Creatinine, Urine: 226.28 mg/dL

## 2014-01-26 MED ORDER — INSULIN DETEMIR 100 UNIT/ML ~~LOC~~ SOLN
20.0000 [IU] | Freq: Every day | SUBCUTANEOUS | Status: DC
  Administered 2014-01-26 – 2014-01-28 (×3): 20 [IU] via SUBCUTANEOUS
  Filled 2014-01-26 (×3): qty 0.2

## 2014-01-26 MED ORDER — BUDESONIDE 0.5 MG/2ML IN SUSP
0.5000 mg | Freq: Two times a day (BID) | RESPIRATORY_TRACT | Status: DC
  Administered 2014-01-26 – 2014-01-28 (×3): 0.5 mg via RESPIRATORY_TRACT
  Filled 2014-01-26 (×5): qty 2

## 2014-01-26 MED ORDER — SODIUM CHLORIDE 0.9 % IV SOLN
INTRAVENOUS | Status: DC
  Administered 2014-01-26 – 2014-01-27 (×3): via INTRAVENOUS

## 2014-01-26 MED ORDER — TRAMADOL HCL 50 MG PO TABS
50.0000 mg | ORAL_TABLET | Freq: Four times a day (QID) | ORAL | Status: DC | PRN
  Administered 2014-01-26: 50 mg via ORAL
  Filled 2014-01-26: qty 1

## 2014-01-26 MED ORDER — DIPHENHYDRAMINE HCL 25 MG PO CAPS
25.0000 mg | ORAL_CAPSULE | Freq: Four times a day (QID) | ORAL | Status: DC | PRN
  Administered 2014-01-26: 25 mg via ORAL
  Filled 2014-01-26: qty 1

## 2014-01-26 NOTE — Progress Notes (Signed)
Pt stated he would place himself on CPAP. RT connected oxygen at 2L and filled water chamber. Pt stated he would call if he needed any assistance. RT will continue to monitor.

## 2014-01-26 NOTE — Progress Notes (Signed)
Wasted in sink 15mg  of Dilaudid.  Witnessed by Lily Kocher, RN.

## 2014-01-26 NOTE — Progress Notes (Signed)
Name: Jon [name]  MRN: [id number]  DOB: 27-Dec-1950  ADMISSION DATE: 01/18/2014  CONSULTATION DATE: 01/18/14  REFERRING MD : Dr. Cyndia Bent   CHIEF COMPLAINT: Hemoptysis   BRIEF SUMMARY:   62 yo former smoker admitted to [facility] from 5/05 to 5/07 for CAP and hemoptysis.  He is on xarelto for A fib.  He has hx of pericardial effusion.  Presented for pericardial window 5/20 and developed recurrent hemoptysis.  PCCM consulted.  Followed by Dr. [name] as outpt.  SIGNIFICANT EVENTS: 5/20 Admit 5/26 Lt VATS lung bx, pericardial window 5/28 Hypotensive, AKI  STUDIES: CT chest 5/20 >> moderate pericardial fluid, multifocal ASD b/l  CULTURES: BAL 5/26 >> Lung tissue 5/26 >> BAL AFB 5/26 >>   ANTIBIOTICS: Aztreonam 5/20 >> 5/25 Vancomycin 5/20 >> 5/26 Levaquin 5/26 >>   SUBJECTIVE:  Denies chest pain.  Breathing okay.  C/o hoarseness.  Walked in hall yesterday.  OBJECTIVE: Temp:  [97.5 F (36.4 C)-99.3 F (37.4 C)] 99.1 F (37.3 C) (05/28 0726) Pulse Rate:  [82-119] 91 (05/28 0600) Resp:  [10-31] 20 (05/28 0600) BP: (81-118)/(35-90) 102/71 mmHg (05/28 0600) SpO2:  [86 %-97 %] 92 % (05/28 0600) Arterial Line BP: (86-108)/(37-50) 86/37 mmHg (05/27 1300) FiO2 (%):  [91 %] 91 % (05/27 1812) Weight:  [249 lb 1.9 oz (113 kg)] 249 lb 1.9 oz (113 kg) (05/28 0500)  PHYSICAL EXAMINATION:  General: no distress Neuro: alert, normal strength HEENT: hoarse voice Cardiovascular: irregular Lungs: no wheeze Abdomen: soft, non tender Musculoskeletal: no edema Skin: no rashes  CBC Recent Labs     01/25/14  0415  01/26/14  0430  WBC  17.9*  10.7*  HGB  15.5  13.1  HCT  46.5  41.1  PLT  212  183   BMET Recent Labs     01/25/14  0415  01/26/14  0430  NA  137  135*  K  4.1  4.3  CL  96  95*  CO2  27  27  BUN  24*  35*  CREATININE  1.37*  2.28*  GLUCOSE  190*  205*    Electrolytes Recent Labs     01/25/14  0415  01/26/14  0430  CALCIUM  9.1  9.4   Liver Enzymes Recent  Labs     01/25/14  0415  AST  25  ALT  23  ALKPHOS  79  BILITOT  1.4*  ALBUMIN  3.1*   Glucose Recent Labs     01/25/14  0714  01/25/14  1125  01/25/14  1537  01/25/14  1911  01/25/14  2334  01/26/14  0405  GLUCAP  167*  167*  222*  174*  130*  169*    Imaging Dg Chest Port 1 View  01/26/2014   CLINICAL DATA:  Status post the VATS procedure. Left pleural effusion  EXAM: PORTABLE CHEST - 1 VIEW  COMPARISON:  Radiograph 01/25/2014, CT 01/18/2014  FINDINGS: Left chest tube in place without pneumothorax. Persistent left pleural effusion and atelectasis. Right central venous line is unchanged. Right lung is clear.  IMPRESSION: 1. No no significant change.  Stable support apparatus. 2. Persistent left lower lobe atelectasis and effusion.   Electronically Signed   By: [name] M.D.   On: 01/26/2014 07:53   Dg Chest Port 1 View  01/25/2014   CLINICAL DATA:  Status post video-assisted thoracoscopy.  EXAM: PORTABLE CHEST - 1 VIEW  COMPARISON:  01/24/2014  FINDINGS: There is new opacity in the left  lung base mostly obscuring the left hemidiaphragm. This is likely a combination of atelectasis and pleural fluid. A rapidly involving infiltrate is possible.  Central vascular congestion and patchy areas of bilateral irregular interstitial densities are stable.  Left chest tube tip lies at the left apex, unchanged. No pneumothorax.  Right internal jugular central venous line is stable with its in the lower superior vena cava.  IMPRESSION: New left lung base opacity common developing since the previous day's study. This is likely combination of atelectasis and pleural fluid. Pneumonia or aspiration pneumonitis is possible.   Electronically Signed   By: Lajean Manes M.D.   On: 01/25/2014 07:37   Dg Chest Port 1 View  01/24/2014   CLINICAL DATA:  Line placement, vats  EXAM: PORTABLE CHEST - 1 VIEW  COMPARISON:  01/23/2014  FINDINGS: Right central line is in place with the tip in the SVC. Left chest  tube is in place. No pneumothorax.  Bilateral areas of atelectasis. Heart is mildly enlarged. Subcutaneous air noted in the left chest wall.  IMPRESSION: Left chest tube and right central line as described above. No pneumothorax. Small amount of left subcutaneous emphysema.   Electronically Signed   By: Rolm Baptise M.D.   On: 01/24/2014 15:48       ASSESSMENT / PLAN:   A: Recurrent hemoptysis with pulmonary infiltrates. S/p Lt VATS 5/26. P: Post-op care, chest tubes per TCTS F/u biopsy results  A: Hx of smoking. Hx of OSA/OHS. Post-op hoarseness. P: CPAP with O2 at night Add pulmicort nebulizer 5/28 Continued scheduled duoneb for now Hold dulera for now  A: Pericardial effusion s/p window 5/26. Hypotension 5/28. Hx of HTN, A fib, hyperlipidemia. P: Hold anti-HTN meds, diuretics  A: Acute kidney injury >> likely from hypotension, and diuresis. P: Hold anti-HTN meds, diuretics Increase IV fluids Check u/a, FeNA  A: DM - insulin pump at home. P: SSI  A: Hx of GERD. Morbid obesity s/p lap band. P: Nutrition per TCTS  A: Chronic Pain, RLS, Insomnia. P:  Continue lyrica, ambien, mirapex  Post-op pain control per TCTS  [name], MD [place] 01/26/2014, 8:12 AM Pager:  [phone] After 3pm call: [phone]

## 2014-01-26 NOTE — Progress Notes (Signed)
2 Days Post-Op Procedure(s) (LRB): VIDEO ASSISTED THORACOSCOPY (Left) PERICARDIAL WINDOW (Left) VIDEO BRONCHOSCOPY (N/A) Subjective: Itching from pain meds. Received benadryl overnight  Objective: Vital signs in last 24 hours: Temp:  [97.5 F (36.4 C)-99.3 F (37.4 C)] 99.1 F (37.3 C) (05/28 0726) Pulse Rate:  [82-119] 91 (05/28 0600) Cardiac Rhythm:  [-] Atrial fibrillation (05/28 0400) Resp:  [10-31] 20 (05/28 0600) BP: (81-118)/(35-90) 102/71 mmHg (05/28 0600) SpO2:  [86 %-97 %] 92 % (05/28 0600) Arterial Line BP: (86-108)/(37-50) 86/37 mmHg (05/27 1300) FiO2 (%):  [91 %] 91 % (05/27 1812) Weight:  [113 kg (249 lb 1.9 oz)] 113 kg (249 lb 1.9 oz) (05/28 0500)  Hemodynamic parameters for last 24 hours:    Intake/Output from previous day: 05/27 0701 - 05/28 0700 In: 1470.7 [P.O.:240; I.V.:1230.7] Out: 710 [Urine:630; Chest Tube:80] Intake/Output this shift:    General appearance: alert and cooperative Heart: regular rate and rhythm, S1, S2 normal, no murmur, click, rub or gallop Lungs: diminished breath sounds LLL some serous drainage around chest tube. Minimal drainage through chest tube.  Lab Results:  Recent Labs  01/25/14 0415 01/26/14 0430  WBC 17.9* 10.7*  HGB 15.5 13.1  HCT 46.5 41.1  PLT 212 183   BMET:  Recent Labs  01/25/14 0415 01/26/14 0430  NA 137 135*  K 4.1 4.3  CL 96 95*  CO2 27 27  GLUCOSE 190* 205*  BUN 24* 35*  CREATININE 1.37* 2.28*  CALCIUM 9.1 9.4    PT/INR: No results found for this basename: LABPROT, INR,  in the last 72 hours ABG    Component Value Date/Time   PHART 7.464* 01/18/2014 1340   HCO3 25.1* 01/18/2014 1340   TCO2 26.2 01/18/2014 1340   O2SAT 96.7 01/18/2014 1340   CBG (last 3)   Recent Labs  01/25/14 1911 01/25/14 2334 01/26/14 0405  GLUCAP 174* 130* 169*    Assessment/Plan: S/P Procedure(s) (LRB): VIDEO ASSISTED THORACOSCOPY (Left) PERICARDIAL WINDOW (Left) VIDEO BRONCHOSCOPY (N/A)  Acute post op  renal failure with increase in creat from 1.37 yesterday to 2.38 this am. This is likely related to low blood pressure yesterday from antihypertensives and diuretic in addition to pain meds. Urine output ok. Will hold antihypertensives until bp comes back up. Continue gentle hydration.  Atrial fibrillation: continue cardizem for rate control.  Remove chest tube.  Will keep foley in today to monitor urine output.  Ambulate and work on IS.  Diabetes: continue SSI. Will add some Levemir.   Follow up on pathology.      LOS: 8 days    Gaye Pollack 01/26/2014

## 2014-01-26 NOTE — Progress Notes (Signed)
Patient ID: Jon [name], male   DOB: 1950-12-02, 63 y.o.   MRN: [id number] EVENING ROUNDS NOTE :     [address]       [place] [address]             [phone]                 2 Days Post-Op Procedure(s) (LRB): VIDEO ASSISTED THORACOSCOPY (Left) PERICARDIAL WINDOW (Left) VIDEO BRONCHOSCOPY (N/A)  Total Length of Stay:  LOS: 8 days  BP 88/68  Pulse 88  Temp(Src) 98.6 F (37 C) (Oral)  Resp 14  Ht 5\' 8"  (1.727 m)  Wt 249 lb 1.9 oz (113 kg)  BMI 37.89 kg/m2  SpO2 98%  .Intake/Output     05/28 0701 - 05/29 0700   P.O.    I.V. (mL/kg) 1103.3 (9.8)   Total Intake(mL/kg) 1103.3 (9.8)   Urine (mL/kg/hr) 695 (0.4)   Chest Tube    Total Output 695   Net +408.3         . sodium chloride 100 mL/hr at 01/26/14 2045     Lab Results  Component Value Date   WBC 10.7* 01/26/2014   HGB 13.1 01/26/2014   HCT 41.1 01/26/2014   PLT 183 01/26/2014   GLUCOSE 205* 01/26/2014   ALT 23 01/25/2014   AST 25 01/25/2014   NA 135* 01/26/2014   K 4.3 01/26/2014   CL 95* 01/26/2014   CREATININE 2.28* 01/26/2014   BUN 35* 01/26/2014   CO2 27 01/26/2014   INR 0.98 01/18/2014   Stable day, walked 3 times around unit without stopping No further hemoptysis  Grace Isaac MD [place] [phone] Office [phone] 01/26/2014 8:49 PM

## 2014-01-27 ENCOUNTER — Inpatient Hospital Stay (HOSPITAL_COMMUNITY): Payer: Medicare Other

## 2014-01-27 LAB — GLUCOSE, CAPILLARY
Glucose-Capillary: 123 mg/dL — ABNORMAL HIGH (ref 70–99)
Glucose-Capillary: 128 mg/dL — ABNORMAL HIGH (ref 70–99)
Glucose-Capillary: 230 mg/dL — ABNORMAL HIGH (ref 70–99)
Glucose-Capillary: 159 mg/dL — ABNORMAL HIGH (ref 70–99)
Glucose-Capillary: 195 mg/dL — ABNORMAL HIGH (ref 70–99)

## 2014-01-27 LAB — BASIC METABOLIC PANEL
BUN: 30 mg/dL — ABNORMAL HIGH (ref 6–23)
CHLORIDE: 96 meq/L (ref 96–112)
CO2: 27 mEq/L (ref 19–32)
Calcium: 9 mg/dL (ref 8.4–10.5)
Creatinine, Ser: 1.23 mg/dL (ref 0.50–1.35)
GFR calc non Af Amer: 60 mL/min — ABNORMAL LOW (ref >90)
GFR, EST AFRICAN AMERICAN: 69 mL/min — AB (ref >90)
Glucose, Bld: 140 mg/dL — ABNORMAL HIGH (ref 70–99)
POTASSIUM: 3.8 meq/L (ref 3.7–5.3)
Sodium: 134 mEq/L — ABNORMAL LOW (ref 137–147)

## 2014-01-27 LAB — TISSUE CULTURE: Culture: NO GROWTH

## 2014-01-27 MED ORDER — IPRATROPIUM-ALBUTEROL 0.5-2.5 (3) MG/3ML IN SOLN
3.0000 mL | Freq: Four times a day (QID) | RESPIRATORY_TRACT | Status: DC
  Administered 2014-01-28: 3 mL via RESPIRATORY_TRACT
  Filled 2014-01-27: qty 3

## 2014-01-27 MED ORDER — INSULIN ASPART 100 UNIT/ML ~~LOC~~ SOLN
0.0000 [IU] | Freq: Three times a day (TID) | SUBCUTANEOUS | Status: DC
  Administered 2014-01-27: 8 [IU] via SUBCUTANEOUS
  Administered 2014-01-27 – 2014-01-28 (×2): 2 [IU] via SUBCUTANEOUS

## 2014-01-27 NOTE — Progress Notes (Signed)
Pt requesting to wear CPAP so he can take a nap. Pt placed on Auto CPAP Max pressure 20cmH20, minimum pressure 5cmH20, and 2lpm O2 bled in. Pt tolerating at this time. RT will continue to monitor.

## 2014-01-27 NOTE — Progress Notes (Signed)
Name: Jon [name]  MRN: [id number]  DOB: 1951-01-09  ADMISSION DATE: 01/18/2014  CONSULTATION DATE: 01/18/14  REFERRING MD : Dr. Cyndia Bent   CHIEF COMPLAINT: Hemoptysis   BRIEF SUMMARY:   63 yo former smoker admitted to [facility] from 5/05 to 5/07 for CAP and hemoptysis.  He is on xarelto for A fib.  He has hx of pericardial effusion.  Presented for pericardial window 5/20 and developed recurrent hemoptysis.  PCCM consulted.  Followed by Dr. [name] as outpt.  SIGNIFICANT EVENTS: 5/20 Admit 5/26 Lt VATS lung bx, pericardial window 5/28 Hypotensive, AKI, Lt chest tube d/c'ed  STUDIES: CT chest 5/20 >> moderate pericardial fluid, multifocal ASD b/l Urine Na 5/28 >> less than 20  CULTURES: BAL 5/26 >> negative Lung tissue 5/26 >> negative BAL AFB 5/26 >>   ANTIBIOTICS: Aztreonam 5/20 >> 5/25 Vancomycin 5/20 >> 5/26 Levaquin 5/26 >>   SUBJECTIVE:  Hoarseness better.  Denies chest pain.  Has chest congestion.  OBJECTIVE: Temp:  [98.1 F (36.7 C)-98.8 F (37.1 C)] 98.3 F (36.8 C) (05/29 0722) Pulse Rate:  [43-106] 102 (05/29 0700) Resp:  [14-33] 17 (05/29 0700) BP: (87-123)/(47-97) 122/72 mmHg (05/29 0700) SpO2:  [93 %-100 %] 93 % (05/29 0700) Weight:  [250 lb (113.4 kg)] 250 lb (113.4 kg) (05/29 [id number])  PHYSICAL EXAMINATION:  General: no distress Neuro: alert, normal strength HEENT: hoarse voice Cardiovascular: irregular Lungs: no wheeze Abdomen: soft, non tender Musculoskeletal: no edema Skin: no rashes  CBC Recent Labs     01/25/14  0415  01/26/14  0430  WBC  17.9*  10.7*  HGB  15.5  13.1  HCT  46.5  41.1  PLT  212  183   BMET Recent Labs     01/25/14  0415  01/26/14  0430  01/27/14  0400  NA  137  135*  134*  K  4.1  4.3  3.8  CL  96  95*  96  CO2  27  27  27   BUN  24*  35*  30*  CREATININE  1.37*  2.28*  1.23  GLUCOSE  190*  205*  140*    Electrolytes Recent Labs     01/25/14  0415  01/26/14  0430  01/27/14  0400  CALCIUM  9.1  9.4  9.0    Liver Enzymes Recent Labs     01/25/14  0415  AST  25  ALT  23  ALKPHOS  79  BILITOT  1.4*  ALBUMIN  3.1*   Glucose Recent Labs     01/26/14  1122  01/26/14  1624  01/26/14  1917  01/26/14  2319  01/27/14  0342  01/27/14  0720  GLUCAP  187*  152*  150*  125*  123*  128*    Imaging Dg Chest Port 1 View  01/27/2014   CLINICAL DATA:  Postop from left VATS.  Hemoptysis.  EXAM: PORTABLE CHEST - 1 VIEW  COMPARISON:  01/26/2014  FINDINGS: Left chest tube is been removed and no pneumothorax is visualized. Persistent opacity is seen in the left lower lung, which is unchanged. Right lung remains clear. Cardiomegaly stable. Right jugular central venous catheter remains in appropriate position.  IMPRESSION: No pneumothorax visualized following left chest tube removal.  Stable opacity in left lower lung.  Stable cardiomegaly.   Electronically Signed   By: [name] M.D.   On: 01/27/2014 07:33   Dg Chest Port 1 View  01/26/2014   CLINICAL DATA:  Status  post the VATS procedure. Left pleural effusion  EXAM: PORTABLE CHEST - 1 VIEW  COMPARISON:  Radiograph 01/25/2014, CT 01/18/2014  FINDINGS: Left chest tube in place without pneumothorax. Persistent left pleural effusion and atelectasis. Right central venous line is unchanged. Right lung is clear.  IMPRESSION: 1. No no significant change.  Stable support apparatus. 2. Persistent left lower lobe atelectasis and effusion.   Electronically Signed   By: [name] M.D.   On: 01/26/2014 07:53    ASSESSMENT / PLAN:   A: Recurrent hemoptysis with pulmonary infiltrates. S/p Lt VATS 5/26. P: Post-op care, chest tubes per TCTS F/u biopsy results  A: Hx of smoking. Hx of OSA/OHS. Post-op hoarseness >> improved 5/29. P: CPAP with O2 at night Added pulmicort nebulizer 5/28 Continued scheduled duoneb for now Bronchial hygiene Hold dulera for now  A: Pericardial effusion s/p window 5/26 >> reactive mesothelial cells/ no malignant  cells. Hypotension 5/28 from hypovolemia >> improved 5/29. Hx of HTN, A fib, hyperlipidemia. P: Per primary team  A: Acute kidney injury >> likely from hypotension, and diuresis >> improved 5/29. P: IV fluids per primary team  A: DM - insulin pump at home. P: SSI  A: Hx of GERD. Morbid obesity s/p lap band. P: Nutrition per TCTS  A: Chronic Pain, RLS, Insomnia. P:  Continue lyrica, ambien, mirapex  Post-op pain control per TCTS  Much improved, and renal fx better.  Awaiting Bx results.    PCCM will f/u on Monday, June 1 if pt remains in hospital.  [name] he is scheduled for pulmonary office follow up with [name] on Monday June 8 at 10:30 am in our [place] office.  He can then have f/u with [name] scheduled.  [name], MD [facility] Pulmonary/Critical Care 01/27/2014, 9:04 AM Pager:  [phone] After 3pm call: [phone]

## 2014-01-27 NOTE — Progress Notes (Signed)
Pt had not placed self on CPAP yet but stated he would go on it after treatment. Pt was placed on Auto CPAP after tx with no complications. RT will continue to monitor.

## 2014-01-27 NOTE — Discharge Summary (Signed)
[address]       [place] [address]             [phone]              Discharge Summary  Name: Jon [name] DOB: 02/02/1951 63 y.o. MRN: [id number]   Admission Date: 01/18/2014 Discharge Date: 01/28/2014    Admitting Diagnosis: Hemoptysis   Discharge Diagnosis:  Hemoptysis Bilateral pulmonary infiltrates Moderate pericardial effusion Postoperative acute kidney injury  Past Medical History  Diagnosis Date  . Hypothyroidism   . Dysrhythmia     atrial fib takes cardizem,   . Hypertension   . Angina   . Restless leg syndrome   . GERD (gastroesophageal reflux disease)   . Hyperlipemia   . H/O hiatal hernia   . Neuromuscular disorder     rls, neuropathy  . Obesity (BMI 30-39.9)   . Pericardial effusion   . Asthma   . Cough     coughing up blood- started last nite, seems to be worse now  . Atrial fibrillation   . Myocardial infarction     "one dr says yes; another says no"  . On home oxygen therapy     "2L w/CPAP at bedtime" (01/18/2014)  . Pneumonia 5/15    "several times" (01/18/2014)  . OSA on CPAP     cpap & oxygen  . IDDM (insulin dependent diabetes mellitus)     insulin pump   . Stroke 2012    denies residual on 01/18/2014  . Arthritis     "knees" (01/18/2014)  . Fibromyalgia      Procedures:  VIDEO BRONCHOSCOPY  LEFT VIDEO ASSISTED THORACOSCOPY, LEFT UPPER LOBE WEDGE BIOPSY  PERICARDIAL WINDOW  - 01/24/2014    HPI:  The patient is a 63 y.o. male with a history of IDDM on an insulin pump, diabetic neuropathy, prior smoking and COPD, prior stroke treated with coumadin and then Xarelto, hypertension, hyperlipidemia, and morbid obesity, s/p gastric lap band procedure. He was admitted earlier this month with fever and hemoptysis and was diagnosed with CAP. He had a chest CT which showed a moderate sized pericardial effusion. He had a CT scan of the chest in 2011 which showed a small pericardial effusion. An echo  showed a moderate free-flowing pericardial effusion with no evidence of hemodynamic compromise but it showed evidence of an elevated CVP. He had a bronchoscopy with washings when he was in a few weeks ago and there was no sign of bleeding and no endobronchial lesions seen. Cytology only showed acute inflammation.  He was treated with antibiotics for his pneumonia and was discharged. His anticoagulation was not restarted. He still feels somewhat short of breath at times. He occasionally has some left sided chest discomfort. He says he has had cardiac workup several times before and has had a couple cardiac caths but they were always negative. He was seen as an outpatient by Dr. Cyndia Bent for cardiac surgery evaluation. It was felt that he would benefit from subxiphoid pericardial window.  He saw Dr. [name] 2 days prior to admission and was doing well and then presented for his preop evaluation on 5/20 and related that he had significant hemoptysis that started again the night before. He was subsequently admitted by Pulmonary Critical Care for further evaluation and treatment.   Hospital Course:  The patient was admitted to [facility] on 01/18/2014. Chest CT showed worsening multilobar airspace disease most severe in the left upper  and lower lobes with some reactive mediastinal adenopathy. The pericardial effusion was still present. Review of his old CT scan of the chest in June 2011 when he presented with shortness of breath and hypoxemia showed this same process bilaterally that was worse on the left. It was felt that there must be an underlying pulmonary process and not just a pneumonia. Dr. Cyndia Bent saw the patient and felt that it would be best to do a repeat bronch under anesthesia followed by left VATS for lung biopsy and drainage of the pericardial effusion by pericardial window. He discussed the procedure, alternatives, and risks with the patient including but not limited to bleeding, blood transfusion,  infection, prolonged air leak from the lung, recurrent hemoptysis, recurrent pericardial effusion, and the possibility that we may still not have a firm diagnosis after this procedure. He agreed to proceed.   The patient was taken to the operating room and underwent the above procedure. Intraoperatively, 500 ml of serous fluid was drained from the pericardium.  He tolerated the procedure well and was transferred to the ICU for further management.   The postoperative course was notable for acute renal insufficiency, with peak creatinine of 2.28.  This was felt to be related to hypotension and diuretic therapy, which was discontinued. He was treated conservatively with hydration and his blood pressure medications were held.  His creatinine has since returned to baseline and blood pressures have improved.  His pulmonary status has remained stable and he has had no hemoptysis postop.  The pulmonary service has followed him and he has been treated with aggressive pulmonary toilet measures and bronchodilators.  He has remained in rate controlled atrial fibrillation. Blood sugars have been well controlled. Incisions are healing well.  Chest tubes and pericardial drains have been discontinued and chest x-rays have remained stable.  Final pathology showed PERICARDIAL FLUID 1, (SPECIMEN 1 OF 1, COLLECTWD ON 01/24/14)  NO MALIGNANT CELLS IDENTIFIED.REACTIVE MESOTHELIAL CELLS PRESENT . He is felt surgically stable for discharge today.    Recent vital signs:  Filed Vitals:   01/28/14 1013  BP: 128/84  Pulse: 98  Temp: 99  Resp: 18    Recent laboratory studies:  CBC:  Recent Labs  01/26/14 0430  WBC 10.7*  HGB 13.1  HCT 41.1  PLT 183   BMET:   Recent Labs  01/26/14 0430 01/27/14 0400  NA 135* 134*  K 4.3 3.8  CL 95* 96  CO2 27 27  GLUCOSE 205* 140*  BUN 35* 30*  CREATININE 2.28* 1.23  CALCIUM 9.4 9.0    PT/INR: No results found for this basename: LABPROT, INR,  in the last 72  hours   Discharge Medications:     Medication List    STOP taking these medications       cefUROXime 500 MG tablet  Commonly known as:  CEFTIN     hydrALAZINE 50 MG tablet  Commonly known as:  APRESOLINE     losartan 50 MG tablet  Commonly known as:  COZAAR     metolazone 2.5 MG tablet  Commonly known as:  ZAROXOLYN     potassium chloride SA 20 MEQ tablet  Commonly known as:  K-DUR,KLOR-CON     torsemide 20 MG tablet  Commonly known as:  DEMADEX      TAKE these medications       albuterol 108 (90 BASE) MCG/ACT inhaler  Commonly known as:  PROVENTIL HFA;VENTOLIN HFA  Inhale 2 puffs into the lungs every 6 (six)  hours as needed for wheezing or shortness of breath.     atorvastatin 40 MG tablet  Commonly known as:  LIPITOR  Take 40 mg by mouth daily.     azelastine 0.1 % nasal spray  Commonly known as:  ASTELIN  Place 1 spray into both nostrils at bedtime as needed.     CALTRATE 600+D PO  Take 2 tablets by mouth 2 (two) times daily.     CENTRUM SILVER PO  Take 1 tablet by mouth daily.     diltiazem 30 MG tablet  Commonly known as:  CARDIZEM  Take 30 mg by mouth 2 (two) times daily.     fluticasone 50 MCG/ACT nasal spray  Commonly known as:  FLONASE  Place 1 spray into both nostrils daily.     Fluticasone-Salmeterol 500-50 MCG/DOSE Aepb  Commonly known as:  ADVAIR  Inhale 1 puff into the lungs every 12 (twelve) hours.     HYDROcodone-homatropine 5-1.5 MG/5ML syrup  Commonly known as:  HYCODAN  Take 5 mLs by mouth every 6 (six) hours as needed for cough.     insulin pump Soln  Inject into the skin continuous. Inject 0.5 units of novolog every hour per insulin pump.     ipratropium-albuterol 0.5-2.5 (3) MG/3ML Soln  Commonly known as:  DUONEB  Take 3 mLs by nebulization every 6 (six) hours as needed. For shortness of breath     isosorbide mononitrate 60 MG 24 hr tablet  Commonly known as:  IMDUR  Take 60 mg by mouth every morning.     latanoprost  0.005 % ophthalmic solution  Commonly known as:  XALATAN  Place 1 drop into both eyes at bedtime.     magnesium oxide 400 MG tablet  Commonly known as:  MAG-OX  Take 1,600 mg by mouth daily.     nitroGLYCERIN 0.4 MG SL tablet  Commonly known as:  NITROSTAT  Place 0.4 mg under the tongue every 5 (five) minutes as needed. For chest pain     NOVOLOG 100 UNIT/ML injection  Generic drug:  insulin aspart  As directed via insulin pump     oxyCODONE-acetaminophen 5-325 MG per tablet  Commonly known as:  PERCOCET/ROXICET  Take 1 tablet by mouth 4 (four) times daily as needed for severe pain. For severe pain     pantoprazole 40 MG tablet  Commonly known as:  PROTONIX  Take 40 mg by mouth 2 (two) times daily.     pramipexole 1.5 MG tablet  Commonly known as:  MIRAPEX  Take 1.5 mg by mouth at bedtime.     pregabalin 75 MG capsule  Commonly known as:  LYRICA  Take 1 capsule (75 mg total) by mouth 2 (two) times daily.     SYSTANE PRESERVATIVE FREE 0.4-0.3 % Soln  Generic drug:  Polyethyl Glycol-Propyl Glycol  Apply 1 drop to eye 4 (four) times daily as needed (dryness of eye).     triamcinolone 55 MCG/ACT nasal inhaler  Commonly known as:  NASACORT  Place 2 sprays into both nostrils daily.     zolpidem 10 MG tablet  Commonly known as:  AMBIEN  Take 10 mg by mouth at bedtime.        Discharge Instructions:  The patient is to refrain from driving, heavy lifting or strenuous activity.  May shower daily and clean incisions with soap and water.  May resume regular diet.   Follow Up:  Follow-up Information   Follow up with PARRETT,TAMMY, NP On  02/06/2014. (1030 am)    Specialty:  Nurse Practitioner   Contact information:   [address]. [place] [address] [phone]       Follow up with Gaye Pollack, MD On 02/15/2014. (Have a chest x-ray at [facility] at 8:30, then see MD at 9:30)    Specialty:  Cardiothoracic Surgery   Contact information:   [address] [place] [address] [phone]       Follow up with Pixie Casino, MD. (Call for follow up within one week)    Specialty:  Cardiology   Contact information:   [place] [address] [phone]       Follow up with Rochel Brome, MD. (Call for a follow up appointment as soon as possible)    Specialty:  Family Medicine   Contact information:   [place] [address] [phone]        Nani Skillern PA-C 01/28/2014, 10:51 AM

## 2014-01-27 NOTE — Progress Notes (Signed)
01/27/2014 2:39 PM Nursing note Pt. Ambulated with RN, RW and on RA 368ft. Pt. Tolerated well. Oxygen saturations checked during walk and were 93% on room air. Pt. Tolerated walk well. Encouraged further ambulation this evening.  [place]

## 2014-01-28 ENCOUNTER — Inpatient Hospital Stay (HOSPITAL_COMMUNITY): Payer: Medicare Other

## 2014-01-28 LAB — GLUCOSE, CAPILLARY
Glucose-Capillary: 149 mg/dL — ABNORMAL HIGH (ref 70–99)
Glucose-Capillary: 253 mg/dL — ABNORMAL HIGH (ref 70–99)

## 2014-01-28 MED ORDER — OXYCODONE-ACETAMINOPHEN 5-325 MG PO TABS: 1.0000 | ORAL_TABLET | Freq: Four times a day (QID) | ORAL | Status: DC | PRN

## 2014-01-28 MED ORDER — MOMETASONE FURO-FORMOTEROL FUM 200-5 MCG/ACT IN AERO
2.0000 | INHALATION_SPRAY | Freq: Two times a day (BID) | RESPIRATORY_TRACT | Status: DC
  Filled 2014-01-28: qty 8.8

## 2014-01-28 NOTE — Progress Notes (Addendum)
      [name]       [place] [address]             [phone]       4 Days Post-Op Procedure(s) (LRB): VIDEO ASSISTED THORACOSCOPY (Left) PERICARDIAL WINDOW (Left) VIDEO BRONCHOSCOPY (N/A)  Subjective: No further hemoptysis. Patient hopes to go home.  Objective: Vital signs in last 24 hours: Temp:  [99 F (37.2 C)-99.1 F (37.3 C)] 99 F (37.2 C) (05/30 0430) Pulse Rate:  [72-96] 81 (05/30 0430) Cardiac Rhythm:  [-] Atrial fibrillation (05/30 0833) Resp:  [15-20] 18 (05/30 0430) BP: (115-136)/(60-78) 134/78 mmHg (05/30 0430) SpO2:  [95 %-100 %] 97 % (05/30 0818) Weight:  [249 lb 12.8 oz (113.309 kg)] 249 lb 12.8 oz (113.309 kg) (05/30 0430)     Intake/Output from previous day: 05/29 0701 - 05/30 0700 In: 360 [P.O.:360] Out: 1650 [Urine:1650]   Physical Exam:  Cardiovascular: RRR Pulmonary: Clear to auscultation on right and diminished at left base; no rales, wheezes, or rhonchi. Abdomen: Soft, non tender, bowel sounds present. Extremities: Mild bilateral lower extremity edema. Wounds: Clean and dry.  No erythema or signs of infection.   Lab Results: CBC: Recent Labs  01/26/14 0430  WBC 10.7*  HGB 13.1  HCT 41.1  PLT 183   BMET:  Recent Labs  01/26/14 0430 01/27/14 0400  NA 135* 134*  K 4.3 3.8  CL 95* 96  CO2 27 27  GLUCOSE 205* 140*  BUN 35* 30*  CREATININE 2.28* 1.23  CALCIUM 9.4 9.0    PT/INR: No results found for this basename: LABPROT, INR,  in the last 72 hours ABG:  INR: Will add last result for INR, ABG once components are confirmed Will add last 4 CBG results once components are confirmed  Assessment/Plan:  1. CV - A fib in the 70's. On Imdur 60 daily and Cardizem 30 bid. Was on Hydralazine 50 qid, Cozaar 50 daily, Zaroxolyn 2.5 M/W/Fri and Demadex 20 bid pre op.  Will not restart at this time as BP is stable and creatinine now normal. Also, on Xarelto but not restarted as had hemoptysis. 2.  Pulmonary -  History of COPD. CXR shows enlargement of cardiac silhouette, small amount of subcutaneous emphysema left lateral chest wall, no pneumothorax, and right lung is clear. Pathology shows PERICARDIAL FLUID 1, (SPECIMEN 1 OF 1, COLLECTWD ON 01/24/14) NO MALIGNANT CELLS IDENTIFIED.REACTIVE MESOTHELIAL CELLS PRESENT.Encourage incentive spirometer. 3. Low grade temp at 99 likely secondary to atelectasis. 4. DM-CBGs 159/195/149. Insulin pump 5. Creatinine down to 1.23 6. Per Dr. Roxan Hockey, ok to discharge. I discussed at length with patient and family that he was on multiple blood pressure and diuretic medications prior to surgery. His kidney function has just returned to normal. I told him to follow up with cardiologist or primary care physician asap, as he will likely need to resume diuretics soon.  Jon M ZimmermanPA-C 01/28/2014,9:00 AM Patient seen and examined, agree with above Home today. xarelto on hold for now. Will see cardiology as outpatient to see when it is safe to restart

## 2014-01-28 NOTE — Progress Notes (Signed)
Patient given discharge instructions, AVS, paper prescription, and medication list. Will discharge home as ordered. Bettina Gavia Andra Matsuo RN

## 2014-01-30 ENCOUNTER — Inpatient Hospital Stay (HOSPITAL_COMMUNITY): Payer: Medicare Other

## 2014-01-30 ENCOUNTER — Emergency Department (HOSPITAL_COMMUNITY): Payer: Medicare Other

## 2014-01-30 ENCOUNTER — Encounter (HOSPITAL_COMMUNITY): Payer: Self-pay | Admitting: Emergency Medicine

## 2014-01-30 ENCOUNTER — Inpatient Hospital Stay (HOSPITAL_COMMUNITY)
Admission: EM | Admit: 2014-01-30 | Discharge: 2014-02-01 | DRG: 066 | Disposition: A | Discharge status: Home Health | Payer: Medicare Other | Attending: Internal Medicine | Admitting: Internal Medicine

## 2014-01-30 DIAGNOSIS — I10 Essential (primary) hypertension: Secondary | ICD-10-CM | POA: Diagnosis present

## 2014-01-30 DIAGNOSIS — R042 Hemoptysis: Secondary | ICD-10-CM | POA: Diagnosis not present

## 2014-01-30 DIAGNOSIS — R4701 Aphasia: Secondary | ICD-10-CM | POA: Diagnosis present

## 2014-01-30 DIAGNOSIS — Z794 Long term (current) use of insulin: Secondary | ICD-10-CM

## 2014-01-30 DIAGNOSIS — Z87891 Personal history of nicotine dependence: Secondary | ICD-10-CM | POA: Diagnosis not present

## 2014-01-30 DIAGNOSIS — J449 Chronic obstructive pulmonary disease, unspecified: Secondary | ICD-10-CM | POA: Diagnosis present

## 2014-01-30 DIAGNOSIS — K219 Gastro-esophageal reflux disease without esophagitis: Secondary | ICD-10-CM | POA: Diagnosis present

## 2014-01-30 DIAGNOSIS — E039 Hypothyroidism, unspecified: Secondary | ICD-10-CM | POA: Diagnosis present

## 2014-01-30 DIAGNOSIS — G2581 Restless legs syndrome: Secondary | ICD-10-CM | POA: Diagnosis present

## 2014-01-30 DIAGNOSIS — E785 Hyperlipidemia, unspecified: Secondary | ICD-10-CM | POA: Diagnosis present

## 2014-01-30 DIAGNOSIS — J189 Pneumonia, unspecified organism: Secondary | ICD-10-CM | POA: Diagnosis not present

## 2014-01-30 DIAGNOSIS — E1149 Type 2 diabetes mellitus with other diabetic neurological complication: Secondary | ICD-10-CM | POA: Diagnosis present

## 2014-01-30 DIAGNOSIS — Z7982 Long term (current) use of aspirin: Secondary | ICD-10-CM | POA: Diagnosis not present

## 2014-01-30 DIAGNOSIS — R079 Chest pain, unspecified: Secondary | ICD-10-CM | POA: Diagnosis not present

## 2014-01-30 DIAGNOSIS — Z79899 Other long term (current) drug therapy: Secondary | ICD-10-CM | POA: Diagnosis not present

## 2014-01-30 DIAGNOSIS — G589 Mononeuropathy, unspecified: Secondary | ICD-10-CM | POA: Diagnosis present

## 2014-01-30 DIAGNOSIS — I6350 Cerebral infarction due to unspecified occlusion or stenosis of unspecified cerebral artery: Secondary | ICD-10-CM | POA: Diagnosis not present

## 2014-01-30 DIAGNOSIS — Z8673 Personal history of transient ischemic attack (TIA), and cerebral infarction without residual deficits: Secondary | ICD-10-CM

## 2014-01-30 DIAGNOSIS — Z6835 Body mass index (BMI) 35.0-35.9, adult: Secondary | ICD-10-CM | POA: Diagnosis not present

## 2014-01-30 DIAGNOSIS — I639 Cerebral infarction, unspecified: Secondary | ICD-10-CM

## 2014-01-30 DIAGNOSIS — Z9884 Bariatric surgery status: Secondary | ICD-10-CM

## 2014-01-30 DIAGNOSIS — R0489 Hemorrhage from other sites in respiratory passages: Secondary | ICD-10-CM

## 2014-01-30 DIAGNOSIS — IMO0001 Reserved for inherently not codable concepts without codable children: Secondary | ICD-10-CM | POA: Diagnosis present

## 2014-01-30 DIAGNOSIS — Z9641 Presence of insulin pump (external) (internal): Secondary | ICD-10-CM

## 2014-01-30 DIAGNOSIS — F29 Unspecified psychosis not due to a substance or known physiological condition: Secondary | ICD-10-CM | POA: Diagnosis not present

## 2014-01-30 DIAGNOSIS — H5347 Heteronymous bilateral field defects: Secondary | ICD-10-CM | POA: Diagnosis present

## 2014-01-30 DIAGNOSIS — I110 Hypertensive heart disease with heart failure: Secondary | ICD-10-CM | POA: Diagnosis present

## 2014-01-30 DIAGNOSIS — I499 Cardiac arrhythmia, unspecified: Secondary | ICD-10-CM

## 2014-01-30 DIAGNOSIS — E669 Obesity, unspecified: Secondary | ICD-10-CM | POA: Diagnosis present

## 2014-01-30 DIAGNOSIS — I482 Chronic atrial fibrillation, unspecified: Secondary | ICD-10-CM | POA: Diagnosis present

## 2014-01-30 DIAGNOSIS — I319 Disease of pericardium, unspecified: Secondary | ICD-10-CM | POA: Diagnosis not present

## 2014-01-30 DIAGNOSIS — G4730 Sleep apnea, unspecified: Secondary | ICD-10-CM | POA: Diagnosis present

## 2014-01-30 DIAGNOSIS — E1142 Type 2 diabetes mellitus with diabetic polyneuropathy: Secondary | ICD-10-CM | POA: Diagnosis present

## 2014-01-30 DIAGNOSIS — G4733 Obstructive sleep apnea (adult) (pediatric): Secondary | ICD-10-CM | POA: Diagnosis present

## 2014-01-30 DIAGNOSIS — J8489 Other specified interstitial pulmonary diseases: Secondary | ICD-10-CM | POA: Diagnosis present

## 2014-01-30 DIAGNOSIS — J4489 Other specified chronic obstructive pulmonary disease: Secondary | ICD-10-CM | POA: Diagnosis present

## 2014-01-30 DIAGNOSIS — R6889 Other general symptoms and signs: Secondary | ICD-10-CM | POA: Diagnosis not present

## 2014-01-30 DIAGNOSIS — N179 Acute kidney failure, unspecified: Secondary | ICD-10-CM | POA: Diagnosis present

## 2014-01-30 DIAGNOSIS — E1165 Type 2 diabetes mellitus with hyperglycemia: Secondary | ICD-10-CM

## 2014-01-30 DIAGNOSIS — I4891 Unspecified atrial fibrillation: Secondary | ICD-10-CM | POA: Diagnosis present

## 2014-01-30 DIAGNOSIS — I313 Pericardial effusion (noninflammatory): Secondary | ICD-10-CM | POA: Diagnosis present

## 2014-01-30 DIAGNOSIS — I3139 Other pericardial effusion (noninflammatory): Secondary | ICD-10-CM | POA: Diagnosis present

## 2014-01-30 LAB — COMPREHENSIVE METABOLIC PANEL
ALT: 42 U/L (ref 0–53)
AST: 33 U/L (ref 0–37)
Albumin: 2.6 g/dL — ABNORMAL LOW (ref 3.5–5.2)
Alkaline Phosphatase: 91 U/L (ref 39–117)
BUN: 11 mg/dL (ref 6–23)
CALCIUM: 9.3 mg/dL (ref 8.4–10.5)
CO2: 25 mEq/L (ref 19–32)
Chloride: 105 mEq/L (ref 96–112)
Creatinine, Ser: 0.96 mg/dL (ref 0.50–1.35)
GFR calc non Af Amer: 85 mL/min — ABNORMAL LOW (ref >90)
GLUCOSE: 184 mg/dL — AB (ref 70–99)
Potassium: 4.1 mEq/L (ref 3.7–5.3)
Sodium: 142 mEq/L (ref 137–147)
TOTAL PROTEIN: 6.2 g/dL (ref 6.0–8.3)
Total Bilirubin: 0.6 mg/dL (ref 0.3–1.2)

## 2014-01-30 LAB — RAPID URINE DRUG SCREEN, HOSP PERFORMED
AMPHETAMINES: NOT DETECTED
BENZODIAZEPINES: NOT DETECTED
Barbiturates: NOT DETECTED
COCAINE: NOT DETECTED
Opiates: POSITIVE — AB
Tetrahydrocannabinol: NOT DETECTED

## 2014-01-30 LAB — I-STAT CHEM 8, ED
BUN: 9 mg/dL (ref 6–23)
Calcium, Ion: 1.28 mmol/L (ref 1.13–1.30)
Chloride: 103 mEq/L (ref 96–112)
Creatinine, Ser: 1 mg/dL (ref 0.50–1.35)
Glucose, Bld: 180 mg/dL — ABNORMAL HIGH (ref 70–99)
HCT: 42 % (ref 39.0–52.0)
Hemoglobin: 14.3 g/dL (ref 13.0–17.0)
POTASSIUM: 3.9 meq/L (ref 3.7–5.3)
SODIUM: 146 meq/L (ref 137–147)
TCO2: 24 mmol/L (ref 0–100)

## 2014-01-30 LAB — I-STAT TROPONIN, ED: Troponin i, poc: 0.01 ng/mL (ref 0.00–0.08)

## 2014-01-30 LAB — URINALYSIS, ROUTINE W REFLEX MICROSCOPIC
BILIRUBIN URINE: NEGATIVE
GLUCOSE, UA: 250 mg/dL — AB
Hgb urine dipstick: NEGATIVE
KETONES UR: NEGATIVE mg/dL
LEUKOCYTES UA: NEGATIVE
Nitrite: NEGATIVE
PROTEIN: NEGATIVE mg/dL
Specific Gravity, Urine: 1.019 (ref 1.005–1.030)
Urobilinogen, UA: 0.2 mg/dL (ref 0.0–1.0)
pH: 8 (ref 5.0–8.0)

## 2014-01-30 LAB — PROTIME-INR
INR: 1.04 (ref 0.00–1.49)
Prothrombin Time: 13.4 seconds (ref 11.6–15.2)

## 2014-01-30 LAB — DIFFERENTIAL
BASOS ABS: 0 10*3/uL (ref 0.0–0.1)
Basophils Relative: 0 % (ref 0–1)
EOS ABS: 0 10*3/uL (ref 0.0–0.7)
EOS PCT: 0 % (ref 0–5)
LYMPHS ABS: 1 10*3/uL (ref 0.7–4.0)
Lymphocytes Relative: 12 % (ref 12–46)
Monocytes Absolute: 0.7 10*3/uL (ref 0.1–1.0)
Monocytes Relative: 8 % (ref 3–12)
Neutro Abs: 7.2 10*3/uL (ref 1.7–7.7)
Neutrophils Relative %: 80 % — ABNORMAL HIGH (ref 43–77)

## 2014-01-30 LAB — APTT: aPTT: 31 seconds (ref 24–37)

## 2014-01-30 LAB — CBC
HCT: 39.2 % (ref 39.0–52.0)
Hemoglobin: 13.4 g/dL (ref 13.0–17.0)
MCH: 27.8 pg (ref 26.0–34.0)
MCHC: 34.2 g/dL (ref 30.0–36.0)
MCV: 81.3 fL (ref 78.0–100.0)
Platelets: 232 10*3/uL (ref 150–400)
RBC: 4.82 MIL/uL (ref 4.22–5.81)
RDW: 15.3 % (ref 11.5–15.5)
WBC: 9 10*3/uL (ref 4.0–10.5)

## 2014-01-30 LAB — CBG MONITORING, ED: GLUCOSE-CAPILLARY: 154 mg/dL — AB (ref 70–99)

## 2014-01-30 LAB — ETHANOL: Alcohol, Ethyl (B): <11 mg/dL (ref 0–11)

## 2014-01-30 MED ORDER — FLUTICASONE PROPIONATE 50 MCG/ACT NA SUSP
1.0000 | Freq: Every day | NASAL | Status: DC
  Administered 2014-01-31 – 2014-02-01 (×2): 1 via NASAL
  Filled 2014-01-30: qty 16

## 2014-01-30 MED ORDER — TORSEMIDE 20 MG PO TABS
20.0000 mg | ORAL_TABLET | Freq: Two times a day (BID) | ORAL | Status: DC
  Administered 2014-01-31 – 2014-02-01 (×2): 20 mg via ORAL
  Filled 2014-01-30 (×6): qty 1

## 2014-01-30 MED ORDER — ASPIRIN 325 MG PO TABS
325.0000 mg | ORAL_TABLET | Freq: Every day | ORAL | Status: DC
  Administered 2014-01-31 – 2014-02-01 (×2): 325 mg via ORAL
  Filled 2014-01-30 (×2): qty 1

## 2014-01-30 MED ORDER — POLYVINYL ALCOHOL 1.4 % OP SOLN
1.0000 [drp] | Freq: Four times a day (QID) | OPHTHALMIC | Status: DC | PRN
  Filled 2014-01-30: qty 15

## 2014-01-30 MED ORDER — ASPIRIN 300 MG RE SUPP: 300.0000 mg | Freq: Once | RECTAL | Status: DC

## 2014-01-30 MED ORDER — ASPIRIN 300 MG RE SUPP
300.0000 mg | Freq: Every day | RECTAL | Status: DC
  Filled 2014-01-30 (×2): qty 1

## 2014-01-30 MED ORDER — ISOSORBIDE MONONITRATE ER 60 MG PO TB24
60.0000 mg | ORAL_TABLET | Freq: Every morning | ORAL | Status: DC
  Administered 2014-01-31 – 2014-02-01 (×2): 60 mg via ORAL
  Filled 2014-01-30 (×2): qty 1

## 2014-01-30 MED ORDER — FLUTICASONE PROPIONATE 50 MCG/ACT NA SUSP: 1.0000 | Freq: Every day | NASAL | Status: DC

## 2014-01-30 MED ORDER — MOMETASONE FURO-FORMOTEROL FUM 200-5 MCG/ACT IN AERO
2.0000 | INHALATION_SPRAY | Freq: Two times a day (BID) | RESPIRATORY_TRACT | Status: DC
  Administered 2014-01-31 – 2014-02-01 (×3): 2 via RESPIRATORY_TRACT
  Filled 2014-01-30: qty 8.8

## 2014-01-30 MED ORDER — LATANOPROST 0.005 % OP SOLN
1.0000 [drp] | Freq: Every day | OPHTHALMIC | Status: DC
  Administered 2014-01-31 (×2): 1 [drp] via OPHTHALMIC
  Filled 2014-01-30: qty 2.5

## 2014-01-30 MED ORDER — DILTIAZEM HCL 30 MG PO TABS
30.0000 mg | ORAL_TABLET | Freq: Two times a day (BID) | ORAL | Status: DC
  Filled 2014-01-30: qty 1

## 2014-01-30 MED ORDER — LISINOPRIL 40 MG PO TABS
40.0000 mg | ORAL_TABLET | Freq: Two times a day (BID) | ORAL | Status: DC
  Administered 2014-01-31 – 2014-02-01 (×3): 40 mg via ORAL
  Filled 2014-01-30 (×4): qty 1

## 2014-01-30 MED ORDER — POTASSIUM CHLORIDE CRYS ER 20 MEQ PO TBCR
20.0000 meq | EXTENDED_RELEASE_TABLET | Freq: Three times a day (TID) | ORAL | Status: DC
  Administered 2014-01-31 – 2014-02-01 (×4): 20 meq via ORAL
  Filled 2014-01-30 (×6): qty 1

## 2014-01-30 MED ORDER — HYDRALAZINE HCL 50 MG PO TABS
50.0000 mg | ORAL_TABLET | Freq: Four times a day (QID) | ORAL | Status: DC
  Filled 2014-01-30 (×2): qty 1

## 2014-01-30 MED ORDER — OXYCODONE-ACETAMINOPHEN 5-325 MG PO TABS: 1.0000 | ORAL_TABLET | Freq: Four times a day (QID) | ORAL | Status: DC | PRN

## 2014-01-30 MED ORDER — POLYETHYL GLYCOL-PROPYL GLYCOL 0.4-0.3 % OP SOLN: 1.0000 [drp] | Freq: Four times a day (QID) | OPHTHALMIC | Status: DC | PRN

## 2014-01-30 MED ORDER — IPRATROPIUM-ALBUTEROL 0.5-2.5 (3) MG/3ML IN SOLN: 3.0000 mL | Freq: Four times a day (QID) | RESPIRATORY_TRACT | Status: DC | PRN

## 2014-01-30 MED ORDER — ATORVASTATIN CALCIUM 40 MG PO TABS
40.0000 mg | ORAL_TABLET | Freq: Every day | ORAL | Status: DC
  Administered 2014-01-31 – 2014-02-01 (×2): 40 mg via ORAL
  Filled 2014-01-30 (×2): qty 1

## 2014-01-30 MED ORDER — METOLAZONE 2.5 MG PO TABS
2.5000 mg | ORAL_TABLET | ORAL | Status: DC
  Administered 2014-02-01: 2.5 mg via ORAL
  Filled 2014-01-30: qty 1

## 2014-01-30 MED ORDER — ALBUTEROL SULFATE HFA 108 (90 BASE) MCG/ACT IN AERS: 2.0000 | INHALATION_SPRAY | Freq: Four times a day (QID) | RESPIRATORY_TRACT | Status: DC | PRN

## 2014-01-30 MED ORDER — ZOLPIDEM TARTRATE 5 MG PO TABS: 10.0000 mg | ORAL_TABLET | Freq: Every day | ORAL | Status: DC

## 2014-01-30 MED ORDER — SENNOSIDES-DOCUSATE SODIUM 8.6-50 MG PO TABS
1.0000 | ORAL_TABLET | Freq: Every evening | ORAL | Status: DC | PRN
  Filled 2014-01-30: qty 1

## 2014-01-30 MED ORDER — PREGABALIN 75 MG PO CAPS
75.0000 mg | ORAL_CAPSULE | Freq: Two times a day (BID) | ORAL | Status: DC
  Administered 2014-01-31 – 2014-02-01 (×3): 75 mg via ORAL
  Filled 2014-01-30 (×3): qty 1

## 2014-01-30 MED ORDER — MAGNESIUM OXIDE 400 (241.3 MG) MG PO TABS
1600.0000 mg | ORAL_TABLET | Freq: Every day | ORAL | Status: DC
  Administered 2014-01-31 – 2014-02-01 (×2): 1600 mg via ORAL
  Filled 2014-01-30 (×2): qty 4

## 2014-01-30 MED ORDER — INSULIN PUMP
SUBCUTANEOUS | Status: DC
  Filled 2014-01-30: qty 1

## 2014-01-30 MED ORDER — PANTOPRAZOLE SODIUM 40 MG PO TBEC
40.0000 mg | DELAYED_RELEASE_TABLET | Freq: Two times a day (BID) | ORAL | Status: DC
  Administered 2014-01-31 – 2014-02-01 (×3): 40 mg via ORAL
  Filled 2014-01-30 (×3): qty 1

## 2014-01-30 MED ORDER — PRAMIPEXOLE DIHYDROCHLORIDE 1.5 MG PO TABS
1.5000 mg | ORAL_TABLET | Freq: Every day | ORAL | Status: DC
  Administered 2014-01-31: 1.5 mg via ORAL
  Filled 2014-01-30 (×3): qty 1

## 2014-01-30 MED ORDER — LOSARTAN POTASSIUM 50 MG PO TABS
50.0000 mg | ORAL_TABLET | Freq: Every day | ORAL | Status: DC
  Administered 2014-01-31 – 2014-02-01 (×2): 50 mg via ORAL
  Filled 2014-01-30 (×2): qty 1

## 2014-01-30 MED ORDER — NITROGLYCERIN 0.4 MG SL SUBL: 0.4000 mg | SUBLINGUAL_TABLET | SUBLINGUAL | Status: DC | PRN

## 2014-01-30 NOTE — ED Notes (Signed)
Patient arrived via [facility] from home with altered mental status. Patient was his normal self up until 3:30pm when he had a sudden onset of confusion only. EMS states patients stroke screen was negative. Patient is oriented to Place, and person only. VSS.

## 2014-01-30 NOTE — H&P (Addendum)
Hospitalist Admission History and Physical  Patient name: Jon [name] Medical record number: [id number] Date of birth: December 10, 1950 Age: 63 y.o. Gender: male  Primary Care Provider: Rochel Brome, MD  Chief Complaint: CVA History of Present Illness:This is a 63 y.o. year old male with multiple medical problems including insulin-dependent diabetes on insulin pump, hx/o CVA, diabetic neuropathy COPD, afib  previously be on Coumadin/xarelto,, hypertension, hyperlipidemia, morbid obesity status post gastric band presenting with stroke. Patient does have been recently admitted may 20th-29th for hemoptysis, bilateral pulmonary infiltrates, pericardial effusion, post operative AKI.  Noted to have had pericardial window as well as video bronchoscopy and VATS procedure. Pt's anticoagulation d/c's secondary to recurrent hemoptysis. Please see discharge summary for full details. Pt states that he was discharged at otherwise baseline. Wife states that pt woke up this am persistently confused. Per wife, pt asked for something to eat and all he did was stare at the plate.EMS subsequently called.  Pt presented to ER hemodynamically stable. In rate controlled afib.  Afebrile. Blood work essentially WNL. Had head CT that showed multifical areas of acute infarct in L hemisphere consistent with L MCA territory insult. No mass effect or hemorrhage. Neuro consulted with recommendation for stroke workup and medical admission. Pt denies any CP, SOB, hemoptysis, drainage from surgical sites.   Assessment and Plan: Jon [name] is a 63 y.o. year old male presenting with CVA   Active Problems:   CVA (cerebral infarction)   CVA: Neuro consult pending. Appreciate recs. Preliminarily will place on full dose ASA. Suspect embolic source in setting of afib. Suspect thrombolytics relatively contraindicated as timeline of sxs unkown. Anticoagulation likely also contraindicated as risk of hemorrhage transformation as well as  hx/o recurrent hemoptysis. Proceed down stroke protocol including MRI/MRA, 2d ECHO, caroitd dopplers, risk stratification labs. Follow up neuro recs. May also need coordination with cardiology and other subspecialties given recent procedures.   Pericardial effusion/hemoptysis: Clinically stable since hospital discharge. No acute changes on CXR. No reports of hemoptysis. Consider CTS/pulm c/s as needed.   COPD: stable. Continue home regimen.   IDDM: insulin pump in place. A1C. Monitor.   HTN/AFib:  Rate controlled currently. Cards c/s pending given multiple comorbidities and presentation. Continue home meds pending bedside swallow eval.     FEN/GI: NPO pending bedside swallow eval.  Prophylaxis: SCDs  Disposition: pending further evaluation .  Code Status:Full Code    Patient Active Problem List   Diagnosis Date Noted  . CVA (cerebral infarction) 01/30/2014  . AKI (acute kidney injury) 01/26/2014  . Pulmonary alveolar hemorrhage 01/20/2014  . Pericardial effusion 01/04/2014  . Atrial fibrillation 01/03/2014  . Hemoptysis 12/21/2013  . Obstructive chronic bronchitis without exacerbation 12/21/2013  . Type II or unspecified type diabetes mellitus with neurological manifestations, not stated as uncontrolled 12/21/2013  . Hypothyroidism   . Dysrhythmia   . Hypertension   . History of stroke   . Restless leg syndrome   . GERD (gastroesophageal reflux disease)   . Sleep apnea   . Hyperlipemia   . Neuropathy   . Obesity (BMI 30-39.9)   . Bariatric surgery status 11/03/2013   Past Medical History: Past Medical History  Diagnosis Date  . Hypothyroidism   . Dysrhythmia     atrial fib takes cardizem,   . Hypertension   . Angina   . Restless leg syndrome   . GERD (gastroesophageal reflux disease)   . Hyperlipemia   . H/O hiatal hernia   . Neuromuscular disorder  rls, neuropathy  . Obesity (BMI 30-39.9)   . Pericardial effusion   . Asthma   . Cough     coughing up  blood- started last nite, seems to be worse now  . Atrial fibrillation   . Myocardial infarction     "one dr says yes; another says no"  . On home oxygen therapy     "2L w/CPAP at bedtime" (01/18/2014)  . Pneumonia 5/15    "several times" (01/18/2014)  . OSA on CPAP     cpap & oxygen  . IDDM (insulin dependent diabetes mellitus)     insulin pump   . Stroke 2012    denies residual on 01/18/2014  . Arthritis     "knees" (01/18/2014)  . Fibromyalgia     Past Surgical History: Past Surgical History  Procedure Laterality Date  . Cataract extraction w/ intraocular lens  implant, bilateral Bilateral   . Sinus exploration  X 2  . Appendectomy    . Cholecystectomy    . Laparoscopic gastric banding  2010  . Video bronchoscopy Bilateral 12/29/2013    Procedure: VIDEO BRONCHOSCOPY WITHOUT FLUORO;  Surgeon: Elsie Stain, MD;  Location: [facility];  Service: Endoscopy;  Laterality: Bilateral;  . Cardiac catheterization  X 2 then 03/03/2012     NL LVF, normal coronaries, vessels are small (HPR: Dr. Beatrix Fetters)  . Hernia repair      UHR  . Umbilical hernia repair    . Video assisted thoracoscopy Left 01/24/2014    Procedure: VIDEO ASSISTED THORACOSCOPY;  Surgeon: Gaye Pollack, MD;  Location: [facility] OR;  Service: Thoracic;  Laterality: Left;  VATS/open lung biopsy  . Pericardial window Left 01/24/2014    Procedure: PERICARDIAL WINDOW;  Surgeon: Gaye Pollack, MD;  Location: [place];  Service: Thoracic;  Laterality: Left;  . Video bronchoscopy N/A 01/24/2014    Procedure: VIDEO BRONCHOSCOPY;  Surgeon: Gaye Pollack, MD;  Location: [facility] OR;  Service: Thoracic;  Laterality: N/A;    Social History: History   Social History  . Marital Status: Married    Spouse Name: N/A    Number of Children: N/A  . Years of Education: N/A   Social History Main Topics  . Smoking status: Former Smoker -- 4 years    Types: Cigars    Quit date: 09/01/2006  . Smokeless tobacco: Never Used  . 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 Drug Use: No  . Sexual Activity: No   Other Topics Concern  . None   Social History Narrative  . None    Family History: Family History  Problem Relation Age of Onset  . Anesthesia problems Neg Hx   . Hypotension Neg Hx   . Malignant hyperthermia Neg Hx   . Pseudochol deficiency Neg Hx   . Cancer Mother     Allergies: Allergies  Allergen Reactions  . Codeine Itching  . Penicillins Rash    Current Facility-Administered Medications  Medication Dose Route Frequency Provider Last Rate Last Dose  . albuterol (PROVENTIL HFA;VENTOLIN HFA) 108 (90 BASE) MCG/ACT inhaler 2 puff  2 puff Inhalation Q6H PRN Shanda Howells, MD      . aspirin suppository 300 mg  300 mg Rectal Once Orpah Greek, MD      . Derrill Memo ON 01/31/2014] aspirin suppository 300 mg  300 mg Rectal Daily Shanda Howells, MD       Or  . Derrill Memo  ON 01/31/2014] aspirin tablet 325 mg  325 mg Oral Daily Shanda Howells, MD      . Derrill Memo ON 01/31/2014] atorvastatin (LIPITOR) tablet 40 mg  40 mg Oral Daily Shanda Howells, MD      . diltiazem (CARDIZEM) tablet 30 mg  30 mg Oral BID Shanda Howells, MD      . Derrill Memo ON 01/31/2014] fluticasone (FLONASE) 50 MCG/ACT nasal spray 1 spray  1 spray Each Nare Daily Shanda Howells, MD      . hydrALAZINE (APRESOLINE) tablet 50 mg  50 mg Oral QID Shanda Howells, MD      . insulin pump   Subcutaneous Continuous Shanda Howells, MD      . ipratropium-albuterol (DUONEB) 0.5-2.5 (3) MG/3ML nebulizer solution 3 mL  3 mL Nebulization Q6H PRN Shanda Howells, MD      . Derrill Memo ON 01/31/2014] isosorbide mononitrate (IMDUR) 24 hr tablet 60 mg  60 mg Oral q morning - 10a Shanda Howells, MD      . latanoprost (XALATAN) 0.005 % ophthalmic solution 1 drop  1 drop Both Eyes QHS Shanda Howells, MD      . lisinopril (PRINIVIL,ZESTRIL) tablet 40 mg  40 mg Oral BID Shanda Howells, MD      . Derrill Memo ON 01/31/2014] losartan (COZAAR) tablet 50 mg  50 mg Oral Daily Shanda Howells, MD      . Derrill Memo ON 01/31/2014] magnesium oxide (MAG-OX) tablet 1,600 mg  1,600 mg Oral Daily Shanda Howells, MD      . metolazone (ZAROXOLYN) tablet 2.5 mg  2.5 mg Oral See admin instructions Shanda Howells, MD      . mometasone-formoterol [facility]) 200-5 MCG/ACT inhaler 2 puff  2 puff Inhalation BID Shanda Howells, MD      . nitroGLYCERIN (NITROSTAT) SL tablet 0.4 mg  0.4 mg Sublingual Q5 min PRN Shanda Howells, MD      . oxyCODONE-acetaminophen (PERCOCET/ROXICET) 5-325 MG per tablet 1 tablet  1 tablet Oral QID PRN Shanda Howells, MD      . pantoprazole (PROTONIX) EC tablet 40 mg  40 mg Oral BID Shanda Howells, MD      . Polyethyl Glycol-Propyl Glycol 0.4-0.3 % SOLN 1 drop  1 drop Ophthalmic QID PRN Shanda Howells, MD      . potassium chloride SA (K-DUR,KLOR-CON) CR tablet 20 mEq  20 mEq Oral TID Shanda Howells, MD      . pramipexole (MIRAPEX) tablet 1.5 mg  1.5 mg Oral QHS Shanda Howells, MD      . pregabalin (LYRICA) capsule 75 mg  75 mg Oral BID Shanda Howells, MD      . senna-docusate (Senokot-S) tablet 1 tablet  1 tablet Oral QHS PRN Shanda Howells, MD      . Derrill Memo ON 01/31/2014] torsemide (DEMADEX) tablet 20 mg  20 mg Oral BID Shanda Howells, MD      . zolpidem Lorrin Mais) tablet 10 mg  10 mg Oral QHS Shanda Howells, MD       Current Outpatient Prescriptions  Medication Sig Dispense Refill  . albuterol (PROVENTIL HFA;VENTOLIN HFA) 108 (90 BASE) MCG/ACT inhaler Inhale 2 puffs into the lungs every 6 (six) hours as needed for wheezing or shortness of breath.      Marland Kitchen atorvastatin (LIPITOR) 40 MG tablet Take 40 mg by mouth daily.      Marland Kitchen azelastine (ASTELIN) 137 MCG/SPRAY nasal spray Place 1 spray into both nostrils at bedtime as needed.       . Calcium Carbonate-Vitamin D (CALTRATE 600+D  PO) Take 2 tablets by mouth 2 (two) times daily.       Marland Kitchen diltiazem (CARDIZEM) 30 MG tablet Take 30 mg by mouth 2 (two) times daily.      . fluticasone (FLONASE) 50 MCG/ACT nasal spray Place 1 spray into both nostrils  daily.      . Fluticasone-Salmeterol (ADVAIR) 500-50 MCG/DOSE AEPB Inhale 1 puff into the lungs every 12 (twelve) hours.      . hydrALAZINE (APRESOLINE) 50 MG tablet Take 50 mg by mouth 4 (four) times daily.      Marland Kitchen ipratropium-albuterol (DUONEB) 0.5-2.5 (3) MG/3ML SOLN Take 3 mLs by nebulization every 6 (six) hours as needed. For shortness of breath      . isosorbide mononitrate (IMDUR) 60 MG 24 hr tablet Take 60 mg by mouth every morning.      . latanoprost (XALATAN) 0.005 % ophthalmic solution Place 1 drop into both eyes at bedtime.      Marland Kitchen lisinopril (PRINIVIL,ZESTRIL) 40 MG tablet Take 40 mg by mouth 2 (two) times daily.      Marland Kitchen losartan (COZAAR) 50 MG tablet Take 50 mg by mouth daily.      . magnesium oxide (MAG-OX) 400 MG tablet Take 1,600 mg by mouth daily.       . metolazone (ZAROXOLYN) 2.5 MG tablet Take 2.5 mg by mouth See admin instructions. Patient takes only on Monday Wednesday Friday. Take 30 minutes before furosemide      . Multiple Vitamins-Minerals (CENTRUM SILVER PO) Take 1 tablet by mouth daily.      Marland Kitchen oxyCODONE-acetaminophen (PERCOCET/ROXICET) 5-325 MG per tablet Take 1 tablet by mouth 4 (four) times daily as needed for severe pain. For severe pain  30 tablet  0  . pantoprazole (PROTONIX) 40 MG tablet Take 40 mg by mouth 2 (two) times daily.      Vladimir Faster Glycol-Propyl Glycol (SYSTANE PRESERVATIVE FREE) 0.4-0.3 % SOLN Apply 1 drop to eye 4 (four) times daily as needed (dryness of eye).       . potassium chloride SA (K-DUR,KLOR-CON) 20 MEQ tablet Take 20 mEq by mouth 3 (three) times daily.      . pramipexole (MIRAPEX) 1.5 MG tablet Take 1.5 mg by mouth at bedtime.      . pregabalin (LYRICA) 75 MG capsule Take 1 capsule (75 mg total) by mouth 2 (two) times daily.  60 capsule  0  . torsemide (DEMADEX) 20 MG tablet Take 20 mg by mouth 2 (two) times daily.      Marland Kitchen triamcinolone (NASACORT) 55 MCG/ACT nasal inhaler Place 2 sprays into both nostrils daily.       Marland Kitchen zolpidem (AMBIEN) 10  MG tablet Take 10 mg by mouth at bedtime.      . Insulin Human (INSULIN PUMP) 100 unit/ml SOLN Inject into the skin continuous. Inject 0.5 units of novolog every hour per insulin pump.      . nitroGLYCERIN (NITROSTAT) 0.4 MG SL tablet Place 0.4 mg under the tongue every 5 (five) minutes as needed. For chest pain      . NOVOLOG 100 UNIT/ML injection As directed via insulin pump       Review Of Systems: 12 point ROS negative except as noted above in HPI.  Physical Exam: Filed Vitals:   01/30/14 2200  BP: 154/84  Pulse: 101  Temp:   Resp: 22    General: moderately obese and mildly confused, aphasic  HEENT: PERRLA and extra ocular movement intact Heart: S1, S2 normal, no  murmur, rub or gallop, regular rate and rhythm Lungs: clear to auscultation, no wheezes or rales and unlabored breathing Abdomen: obese abdomen, nontender, stable post operative changes in LU abdomen/L chest  Extremities: extremities normal, atraumatic, no cyanosis or edema Skin:no rashes, no ecchymoses Neurology: mildly cooperative to exam, mild weakness in LLE vs. RLE  Labs and Imaging: Lab Results  Component Value Date/Time   NA 146 01/30/2014  7:55 PM   K 3.9 01/30/2014  7:55 PM   CL 103 01/30/2014  7:55 PM   CO2 25 01/30/2014  7:47 PM   BUN 9 01/30/2014  7:55 PM   CREATININE 1.00 01/30/2014  7:55 PM   GLUCOSE 180* 01/30/2014  7:55 PM   Lab Results  Component Value Date   WBC 9.0 01/30/2014   HGB 14.3 01/30/2014   HCT 42.0 01/30/2014   MCV 81.3 01/30/2014   PLT 232 01/30/2014    Ct Head Wo Contrast  01/30/2014   CLINICAL DATA:  Altered mental status.  Sudden onset of confusion.  EXAM: CT HEAD WITHOUT CONTRAST  TECHNIQUE: Contiguous axial images were obtained from the base of the skull through the vertex without contrast.  COMPARISON:  10/28/2010 CT head from [facility].  FINDINGS: Generalized atrophy. Chronic microvascular ischemic change. No hemorrhage, mass lesion, or hydrocephalus.  Patchy hypoattenuation of the left  posterior temporal, parietal, and occipital cortex and adjacent white matter, likely representing acute infarction. Further anteriorly, possible acute infarction involving left anterior frontal cortex (image 21). No convincing CT signs of proximal vascular thrombosis although moderate vascular calcification is observed in bilateral carotid siphons. Slight hyperattenuation just dorsal to the left thalamus is felt to represent choroid plexus, not acute hemorrhage, when correlated with previous CT examinations.  Calvarium intact. Chronically disease bilateral maxillary sinuses without acute air-fluid level. Previous maxillary sinus surgery. Bilateral cataract extraction.  IMPRESSION: Multifocal areas of acute infarction are suspected in the left hemisphere, consistent with a left MCA territory insult. Proximal vascular thrombosis versus a shower of emboli could be associated with these widely separated areas of cortical and subcortical cytotoxic edema. No mass effect or hemorrhage at this time.   Electronically Signed   By: Rolla Flatten M.D.   On: 01/30/2014 21:17           Shanda Howells MD  Pager: [phone]

## 2014-01-30 NOTE — ED Notes (Signed)
Family at bedside. 

## 2014-01-30 NOTE — ED Provider Notes (Signed)
Angiocath insertion Performed by: Corlis Leak  Consent: Verbal consent obtained. Risks and benefits: risks, benefits and alternatives were discussed Time out: Immediately prior to procedure a "time out" was called to verify the correct patient, procedure, equipment, support staff and site/side marked as required.  Preparation: Patient was prepped and draped in the usual sterile fashion.  Vein Location: right brachial  Ultrasound Guided  Gauge: 20  Normal blood return and flush without difficulty Patient tolerance: Patient tolerated the procedure well with no immediate complications.     Corlis Leak, MD 01/30/14 2019

## 2014-01-30 NOTE — ED Notes (Signed)
Resident at bedside.  

## 2014-01-30 NOTE — ED Provider Notes (Signed)
Patient was seen by me. Please see my separate note for the encounter. Patient had an ultrasound-guided IV placed by Doctor Corlis Leak under my direct supervision. Please see his note for the procedure.    Orpah Greek, MD 01/30/14 2022

## 2014-01-30 NOTE — ED Notes (Signed)
Patient transported to MRI 

## 2014-01-30 NOTE — ED Notes (Signed)
Report called to unit 3S.

## 2014-01-30 NOTE — ED Notes (Signed)
CBG Taken = 154

## 2014-01-30 NOTE — ED Provider Notes (Signed)
CSN: [id number]     Arrival date & time 01/30/14  1925 History   First MD Initiated Contact with Patient 01/30/14 1927     Chief Complaint  Patient presents with  . Altered Mental Status     (Consider location/radiation/quality/duration/timing/severity/associated sxs/prior Treatment) HPI Comments: Patient brought to the ER by ambulance. Patient reportedly had sudden onset of confusion at home around 3 PM or possibly 3:30 PM today. This information was provided by EMS, as the patient's wife has not presented yet. Timing is therefore seemingly unreliable. EMS reported they did not find any focal deficits.  At arrival to the ER, patient is confused. He is not sure what hospital he is at. He appears to be having difficulty speaking, at times to find words. He intermittently, however, does answer questions appropriately, including timing of his recent illness and admission here at this hospital last week.  Patient reports continued pain in the left chest area where he recently had surgery and biopsy. This has not changed from baseline. Is not experiencing any shortness of breath. He denies headache.  Patient is a 63 y.o. male presenting with altered mental status.  Altered Mental Status Presenting symptoms: confusion   Associated symptoms: no headaches     Past Medical History  Diagnosis Date  . Hypothyroidism   . Dysrhythmia     atrial fib takes cardizem,   . Hypertension   . Angina   . Restless leg syndrome   . GERD (gastroesophageal reflux disease)   . Hyperlipemia   . H/O hiatal hernia   . Neuromuscular disorder     rls, neuropathy  . Obesity (BMI 30-39.9)   . Pericardial effusion   . Asthma   . Cough     coughing up blood- started last nite, seems to be worse now  . Atrial fibrillation   . Myocardial infarction     "one dr says yes; another says no"  . On home oxygen therapy     "2L w/CPAP at bedtime" (01/18/2014)  . Pneumonia 5/15    "several times" (01/18/2014)  . OSA on  CPAP     cpap & oxygen  . IDDM (insulin dependent diabetes mellitus)     insulin pump   . Stroke 2012    denies residual on 01/18/2014  . Arthritis     "knees" (01/18/2014)  . Fibromyalgia    Past Surgical History  Procedure Laterality Date  . Cataract extraction w/ intraocular lens  implant, bilateral Bilateral   . Sinus exploration  X 2  . Appendectomy    . Cholecystectomy    . Laparoscopic gastric banding  2010  . Video bronchoscopy Bilateral 12/29/2013    Procedure: VIDEO BRONCHOSCOPY WITHOUT FLUORO;  Surgeon: Elsie Stain, MD;  Location: [facility];  Service: Endoscopy;  Laterality: Bilateral;  . Cardiac catheterization  X 2 then 03/03/2012     NL LVF, normal coronaries, vessels are small (HPR: Dr. Beatrix Fetters)  . Hernia repair      UHR  . Umbilical hernia repair    . Video assisted thoracoscopy Left 01/24/2014    Procedure: VIDEO ASSISTED THORACOSCOPY;  Surgeon: Gaye Pollack, MD;  Location: [facility] OR;  Service: Thoracic;  Laterality: Left;  VATS/open lung biopsy  . Pericardial window Left 01/24/2014    Procedure: PERICARDIAL WINDOW;  Surgeon: Gaye Pollack, MD;  Location: [place];  Service: Thoracic;  Laterality: Left;  . Video bronchoscopy N/A 01/24/2014    Procedure: VIDEO BRONCHOSCOPY;  Surgeon: Gaye Pollack,  MD;  Location: [facility] OR;  Service: Thoracic;  Laterality: N/A;   Family History  Problem Relation Age of Onset  . Anesthesia problems Neg Hx   . Hypotension Neg Hx   . Malignant hyperthermia Neg Hx   . Pseudochol deficiency Neg Hx   . Cancer Mother    History  Substance Use Topics  . Smoking status: Former Smoker -- 4 years    Types: Cigars    Quit date: 09/01/2006  . Smokeless tobacco: Never Used  . 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   Cardiovascular: Positive for chest pain.  Neurological: Positive for speech difficulty. Negative for headaches.   Psychiatric/Behavioral: Positive for confusion.  All other systems reviewed and are negative.     Allergies  Codeine and Penicillins  Home Medications   Prior to Admission medications   Medication Sig Start Date End Date Taking? Authorizing Provider  albuterol (PROVENTIL HFA;VENTOLIN HFA) 108 (90 BASE) MCG/ACT inhaler Inhale 2 puffs into the lungs every 6 (six) hours as needed for wheezing or shortness of breath.   Yes Historical Provider, MD  atorvastatin (LIPITOR) 40 MG tablet Take 40 mg by mouth daily.   Yes Historical Provider, MD  azelastine (ASTELIN) 137 MCG/SPRAY nasal spray Place 1 spray into both nostrils at bedtime as needed.    Yes Historical Provider, MD  Calcium Carbonate-Vitamin D (CALTRATE 600+D PO) Take 2 tablets by mouth 2 (two) times daily.    Yes Historical Provider, MD  diltiazem (CARDIZEM) 30 MG tablet Take 30 mg by mouth 2 (two) times daily.   Yes Historical Provider, MD  fluticasone (FLONASE) 50 MCG/ACT nasal spray Place 1 spray into both nostrils daily.   Yes Historical Provider, MD  Fluticasone-Salmeterol (ADVAIR) 500-50 MCG/DOSE AEPB Inhale 1 puff into the lungs every 12 (twelve) hours.   Yes Historical Provider, MD  hydrALAZINE (APRESOLINE) 50 MG tablet Take 50 mg by mouth 4 (four) times daily.   Yes Historical Provider, MD  ipratropium-albuterol (DUONEB) 0.5-2.5 (3) MG/3ML SOLN Take 3 mLs by nebulization every 6 (six) hours as needed. For shortness of breath   Yes Historical Provider, MD  isosorbide mononitrate (IMDUR) 60 MG 24 hr tablet Take 60 mg by mouth every morning.   Yes Historical Provider, MD  latanoprost (XALATAN) 0.005 % ophthalmic solution Place 1 drop into both eyes at bedtime.   Yes Historical Provider, MD  lisinopril (PRINIVIL,ZESTRIL) 40 MG tablet Take 40 mg by mouth 2 (two) times daily.   Yes Historical Provider, MD  losartan (COZAAR) 50 MG tablet Take 50 mg by mouth daily.   Yes Historical Provider, MD  magnesium oxide (MAG-OX) 400 MG tablet  Take 1,600 mg by mouth daily.    Yes Historical Provider, MD  metolazone (ZAROXOLYN) 2.5 MG tablet Take 2.5 mg by mouth See admin instructions. Patient takes only on Monday Wednesday Friday. Take 30 minutes before furosemide   Yes Historical Provider, MD  Multiple Vitamins-Minerals (CENTRUM SILVER PO) Take 1 tablet by mouth daily.   Yes Historical Provider, MD  oxyCODONE-acetaminophen (PERCOCET/ROXICET) 5-325 MG per tablet Take 1 tablet by mouth 4 (four) times daily as needed for severe pain. For severe pain 01/28/14  Yes Donielle Liston Alba, PA-C  pantoprazole (PROTONIX) 40 MG tablet Take 40 mg by mouth 2 (two) times daily.   Yes Historical Provider, MD  Polyethyl Glycol-Propyl Glycol (SYSTANE PRESERVATIVE FREE) 0.4-0.3 %  SOLN Apply 1 drop to eye 4 (four) times daily as needed (dryness of eye).    Yes Historical Provider, MD  potassium chloride SA (K-DUR,KLOR-CON) 20 MEQ tablet Take 20 mEq by mouth 3 (three) times daily.   Yes Historical Provider, MD  pramipexole (MIRAPEX) 1.5 MG tablet Take 1.5 mg by mouth at bedtime.   Yes Historical Provider, MD  pregabalin (LYRICA) 75 MG capsule Take 1 capsule (75 mg total) by mouth 2 (two) times daily. 01/05/14  Yes Grace Bushy Minor, NP  torsemide (DEMADEX) 20 MG tablet Take 20 mg by mouth 2 (two) times daily.   Yes Historical Provider, MD  triamcinolone (NASACORT) 55 MCG/ACT nasal inhaler Place 2 sprays into both nostrils daily.    Yes Historical Provider, MD  zolpidem (AMBIEN) 10 MG tablet Take 10 mg by mouth at bedtime.   Yes Historical Provider, MD  Insulin Human (INSULIN PUMP) 100 unit/ml SOLN Inject into the skin continuous. Inject 0.5 units of novolog every hour per insulin pump.    Historical Provider, MD  nitroGLYCERIN (NITROSTAT) 0.4 MG SL tablet Place 0.4 mg under the tongue every 5 (five) minutes as needed. For chest pain    Historical Provider, MD  NOVOLOG 100 UNIT/ML injection As directed via insulin pump    Historical Provider, MD   BP 139/82   Pulse 90  Temp(Src) 97.6 F (36.4 C) (Oral)  Resp 23  SpO2 98% Physical Exam  Constitutional: He is oriented to person, place, and time. He appears well-developed and well-nourished. No distress.  HENT:  Head: Normocephalic and atraumatic.  Right Ear: Hearing normal.  Left Ear: Hearing normal.  Nose: Nose normal.  Mouth/Throat: Oropharynx is clear and moist and mucous membranes are normal.  Eyes: Conjunctivae and EOM are normal. Pupils are equal, round, and reactive to light.  Neck: Normal range of motion. Neck supple.  Cardiovascular: Regular rhythm, S1 normal and S2 normal.  Exam reveals no gallop and no friction rub.   No murmur heard. Pulmonary/Chest: Effort normal and breath sounds normal. No respiratory distress. He exhibits no tenderness.  Abdominal: Soft. Normal appearance and bowel sounds are normal. There is no hepatosplenomegaly. There is no tenderness. There is no rebound, no guarding, no tenderness at McBurney's point and negative Murphy's sign. No hernia.  Musculoskeletal: Normal range of motion.  Neurological: He is alert and oriented to person, place, and time. He has normal strength. No cranial nerve deficit or sensory deficit. Coordination normal. GCS eye subscore is 4. GCS verbal subscore is 5. GCS motor subscore is 6.  Extraocular muscle movement: normal No visual field cut Pupils: equal and reactive both direct and consensual response is normal No nystagmus present            Sensory function is intact to light touch, pinprick Proprioception intact  Grip strength 5/5 symmetric in upper extremities No pronator drift  Right Lower extremity strength 4/5 against gravity Left Lower extremity strength 5/5 against gravity  Finger to nose reveals ataxia/endpoint dysmetria bilaterally    Rhomberg: normal   Skin: Skin is warm, dry and intact. No rash noted. No cyanosis.  Psychiatric: He has a normal mood and affect. His speech is normal and behavior is normal.  Thought content normal.    ED Course  Procedures (including critical care time) Labs Review Labs Reviewed  DIFFERENTIAL - Abnormal; Notable for the following:    Neutrophils Relative % 80 (*)    All other components within normal limits  COMPREHENSIVE METABOLIC PANEL -  Abnormal; Notable for the following:    Glucose, Bld 184 (*)    Albumin 2.6 (*)    GFR calc non Af Amer 85 (*)    All other components within normal limits  I-STAT CHEM 8, ED - Abnormal; Notable for the following:    Glucose, Bld 180 (*)    All other components within normal limits  CBG MONITORING, ED - Abnormal; Notable for the following:    Glucose-Capillary 154 (*)    All other components within normal limits  ETHANOL  PROTIME-INR  APTT  CBC  URINE RAPID DRUG SCREEN (HOSP PERFORMED)  URINALYSIS, ROUTINE W REFLEX MICROSCOPIC  I-STAT TROPOININ, ED  I-STAT TROPOININ, ED    Imaging Review Ct Head Wo Contrast  01/30/2014   CLINICAL DATA:  Altered mental status.  Sudden onset of confusion.  EXAM: CT HEAD WITHOUT CONTRAST  TECHNIQUE: Contiguous axial images were obtained from the base of the skull through the vertex without contrast.  COMPARISON:  10/28/2010 CT head from [facility].  FINDINGS: Generalized atrophy. Chronic microvascular ischemic change. No hemorrhage, mass lesion, or hydrocephalus.  Patchy hypoattenuation of the left posterior temporal, parietal, and occipital cortex and adjacent white matter, likely representing acute infarction. Further anteriorly, possible acute infarction involving left anterior frontal cortex (image 21). No convincing CT signs of proximal vascular thrombosis although moderate vascular calcification is observed in bilateral carotid siphons. Slight hyperattenuation just dorsal to the left thalamus is felt to represent choroid plexus, not acute hemorrhage, when correlated with previous CT examinations.  Calvarium intact. Chronically disease bilateral maxillary sinuses without acute  air-fluid level. Previous maxillary sinus surgery. Bilateral cataract extraction.  IMPRESSION: Multifocal areas of acute infarction are suspected in the left hemisphere, consistent with a left MCA territory insult. Proximal vascular thrombosis versus a shower of emboli could be associated with these widely separated areas of cortical and subcortical cytotoxic edema. No mass effect or hemorrhage at this time.   Electronically Signed   By: Rolla Flatten M.D.   On: 01/30/2014 21:17     EKG Interpretation   Date/Time:  Monday January 30 2014 19:49:13 EDT Ventricular Rate:  87 PR Interval:    QRS Duration: 128 QT Interval:  389 QTC Calculation: 468 R Axis:   112 Text Interpretation:  Atrial fibrillation Nonspecific intraventricular  conduction delay Probable anterolateral infarct, recent No significant  change since last tracing Confirmed by POLLINA  MD, [place] [phone]) on  01/30/2014 8:06:08 PM      MDM   Final diagnoses:  Acute CVA (cerebrovascular accident)   EMS initially reported the patient's symptoms began 3 or 3:30 today. Once the wife arrived, however, she informed us that she called him at 3 or 3:30 and he was very confused, onset was unknown. It is therefore not a candidate for a code stroke, although stroke was considered to be a very strong possibility for the patient's symptoms. He did have some slight right-sided weakness and was experiencing expressive aphasia. She workup was initiated and CAT scan does show multifocal areas in the left side of the brain in the MCA distribution that are felt to be acute stroke.  As discussed briefly with Doctor Leonel Ramsay, neurology. He will evaluate the patient. Patient will be admitted to internal medicine as an unassigned patient for further management.  CRITICAL CARE Performed by: Orpah Greek   Total critical care time: 70min  Critical care time was exclusive of separately billable procedures and treating other  patients.  Critical care was necessary  to treat or prevent imminent or life-threatening deterioration.  Critical care was time spent personally by me on the following activities: development of treatment plan with patient and/or surrogate as well as nursing, discussions with consultants, evaluation of patient's response to treatment, examination of patient, obtaining history from patient or surrogate, ordering and performing treatments and interventions, ordering and review of laboratory studies, ordering and review of radiographic studies, pulse oximetry and re-evaluation of patient's condition.     Orpah Greek, MD 01/30/14 2150

## 2014-01-31 ENCOUNTER — Inpatient Hospital Stay (HOSPITAL_COMMUNITY): Payer: Medicare Other

## 2014-01-31 ENCOUNTER — Encounter (HOSPITAL_COMMUNITY): Payer: Self-pay

## 2014-01-31 DIAGNOSIS — E669 Obesity, unspecified: Secondary | ICD-10-CM

## 2014-01-31 DIAGNOSIS — I319 Disease of pericardium, unspecified: Secondary | ICD-10-CM

## 2014-01-31 DIAGNOSIS — E1149 Type 2 diabetes mellitus with other diabetic neurological complication: Secondary | ICD-10-CM

## 2014-01-31 DIAGNOSIS — I4891 Unspecified atrial fibrillation: Secondary | ICD-10-CM | POA: Diagnosis not present

## 2014-01-31 DIAGNOSIS — J449 Chronic obstructive pulmonary disease, unspecified: Secondary | ICD-10-CM | POA: Diagnosis not present

## 2014-01-31 DIAGNOSIS — E039 Hypothyroidism, unspecified: Secondary | ICD-10-CM

## 2014-01-31 DIAGNOSIS — R042 Hemoptysis: Secondary | ICD-10-CM | POA: Diagnosis not present

## 2014-01-31 DIAGNOSIS — J4489 Other specified chronic obstructive pulmonary disease: Secondary | ICD-10-CM

## 2014-01-31 DIAGNOSIS — I6350 Cerebral infarction due to unspecified occlusion or stenosis of unspecified cerebral artery: Secondary | ICD-10-CM | POA: Diagnosis not present

## 2014-01-31 DIAGNOSIS — R4701 Aphasia: Secondary | ICD-10-CM

## 2014-01-31 DIAGNOSIS — E785 Hyperlipidemia, unspecified: Secondary | ICD-10-CM

## 2014-01-31 DIAGNOSIS — I10 Essential (primary) hypertension: Secondary | ICD-10-CM

## 2014-01-31 LAB — LIPID PANEL
CHOL/HDL RATIO: 2.9 ratio
Cholesterol: 91 mg/dL (ref 0–200)
HDL: 31 mg/dL — ABNORMAL LOW (ref >39)
LDL Cholesterol: 46 mg/dL (ref 0–99)
TRIGLYCERIDES: 69 mg/dL (ref <150)
VLDL: 14 mg/dL (ref 0–40)

## 2014-01-31 LAB — GLUCOSE, CAPILLARY
GLUCOSE-CAPILLARY: 140 mg/dL — AB (ref 70–99)
GLUCOSE-CAPILLARY: 211 mg/dL — AB (ref 70–99)
GLUCOSE-CAPILLARY: 213 mg/dL — AB (ref 70–99)
Glucose-Capillary: 204 mg/dL — ABNORMAL HIGH (ref 70–99)
Glucose-Capillary: 192 mg/dL — ABNORMAL HIGH (ref 70–99)

## 2014-01-31 LAB — HEMOGLOBIN A1C
Hgb A1c MFr Bld: 8.1 % — ABNORMAL HIGH (ref <5.7)
MEAN PLASMA GLUCOSE: 186 mg/dL — AB (ref <117)

## 2014-01-31 MED ORDER — INSULIN ASPART 100 UNIT/ML ~~LOC~~ SOLN
0.0000 [IU] | Freq: Three times a day (TID) | SUBCUTANEOUS | Status: DC
  Administered 2014-01-31 – 2014-02-01 (×2): 5 [IU] via SUBCUTANEOUS
  Administered 2014-02-01: 2 [IU] via SUBCUTANEOUS

## 2014-01-31 MED ORDER — ZOLPIDEM TARTRATE 5 MG PO TABS
5.0000 mg | ORAL_TABLET | Freq: Every day | ORAL | Status: DC
  Administered 2014-01-31: 5 mg via ORAL
  Filled 2014-01-31: qty 1

## 2014-01-31 MED ORDER — INSULIN ASPART 100 UNIT/ML ~~LOC~~ SOLN
0.0000 [IU] | Freq: Every day | SUBCUTANEOUS | Status: DC
  Administered 2014-01-31: 2 [IU] via SUBCUTANEOUS

## 2014-01-31 MED ORDER — BIOTENE DRY MOUTH MT LIQD
15.0000 mL | Freq: Two times a day (BID) | OROMUCOSAL | Status: DC
  Administered 2014-01-31 – 2014-02-01 (×2): 15 mL via OROMUCOSAL

## 2014-01-31 MED ORDER — STROKE: EARLY STAGES OF RECOVERY BOOK
Freq: Once | Status: DC
  Filled 2014-01-31: qty 1

## 2014-01-31 MED ORDER — HYDRALAZINE HCL 20 MG/ML IJ SOLN: 10.0000 mg | INTRAMUSCULAR | Status: DC | PRN

## 2014-01-31 MED ORDER — INSULIN DETEMIR 100 UNIT/ML ~~LOC~~ SOLN
20.0000 [IU] | Freq: Every day | SUBCUTANEOUS | Status: DC
  Administered 2014-01-31: 20 [IU] via SUBCUTANEOUS
  Filled 2014-01-31 (×2): qty 0.2

## 2014-01-31 MED ORDER — DILTIAZEM HCL 25 MG/5ML IV SOLN
10.0000 mg | Freq: Three times a day (TID) | INTRAVENOUS | Status: DC
  Administered 2014-01-31 – 2014-02-01 (×5): 10 mg via INTRAVENOUS
  Filled 2014-01-31 (×11): qty 5

## 2014-01-31 NOTE — Evaluation (Signed)
Clinical/Bedside Swallow Evaluation Patient Details  Name: Jon [name] MRN: [id number] Date of Birth: 23-Feb-1951  Today's Date: 01/31/2014 Time: 0830-0919 SLP Time Calculation (min): 40 min  Past Medical History:  Past Medical History  Diagnosis Date  . Hypothyroidism   . Dysrhythmia     atrial fib takes cardizem,   . Hypertension   . Angina   . Restless leg syndrome   . GERD (gastroesophageal reflux disease)   . Hyperlipemia   . H/O hiatal hernia   . Neuromuscular disorder     rls, neuropathy  . Obesity (BMI 30-39.9)   . Pericardial effusion   . Asthma   . Cough     coughing up blood- started last nite, seems to be worse now  . Atrial fibrillation   . Myocardial infarction     "one dr says yes; another says no"  . On home oxygen therapy     "2L w/CPAP at bedtime" (01/18/2014)  . Pneumonia 5/15    "several times" (01/18/2014)  . OSA on CPAP     cpap & oxygen  . IDDM (insulin dependent diabetes mellitus)     insulin pump   . Stroke 2012    denies residual on 01/18/2014  . Arthritis     "knees" (01/18/2014)  . Fibromyalgia    Past Surgical History:  Past Surgical History  Procedure Laterality Date  . Cataract extraction w/ intraocular lens  implant, bilateral Bilateral   . Sinus exploration  X 2  . Appendectomy    . Cholecystectomy    . Laparoscopic gastric banding  2010  . Video bronchoscopy Bilateral 12/29/2013    Procedure: VIDEO BRONCHOSCOPY WITHOUT FLUORO;  Surgeon: Elsie Stain, MD;  Location: [facility];  Service: Endoscopy;  Laterality: Bilateral;  . Cardiac catheterization  X 2 then 03/03/2012     NL LVF, normal coronaries, vessels are small (HPR: Dr. Beatrix Fetters)  . Hernia repair      UHR  . Umbilical hernia repair    . Video assisted thoracoscopy Left 01/24/2014    Procedure: VIDEO ASSISTED THORACOSCOPY;  Surgeon: Gaye Pollack, MD;  Location: [facility] OR;  Service: Thoracic;  Laterality: Left;  VATS/open lung biopsy  . Pericardial window Left 01/24/2014    Procedure: PERICARDIAL WINDOW;  Surgeon: Gaye Pollack, MD;  Location: [place];  Service: Thoracic;  Laterality: Left;  . Video bronchoscopy N/A 01/24/2014    Procedure: VIDEO BRONCHOSCOPY;  Surgeon: Gaye Pollack, MD;  Location: [facility] OR;  Service: Thoracic;  Laterality: N/A;   HPI:  63 year old male with a history of atrial fibrillation who was taken off xarelto because of recurrent hemoptysis and recent window procedure for pericardial effusion who presents with confusion do to multifocal left MCA territory infarct. CT reads Patchy hypoattenuation of the left posterior temporal, parietal, and occipital cortex and adjacent white matter. CXR shows infection at right lung base, left sided pna. Pt has a history of lap band.    Assessment / Plan / Recommendation Clinical Impression  Pt demonstrates adequate swallow function subjectively. There is mild asymmetry of right lip, but this does not appear to affect PO intake. No direct evidence of aspiration. Pt does have a congested cough at baseline, but did not cough directly with or following signficant intake of water. Swallow felt strong and timely. Recommend a regular diet and thin liquids will f/u x1 for tolerance given existing pna and new CVA.     Aspiration Risk  Mild    Diet Recommendation  Regular;Thin liquid   Liquid Administration via: Cup;Straw Medication Administration: Whole meds with liquid Supervision: Intermittent supervision to cue for compensatory strategies Compensations: Slow rate;Small sips/bites Postural Changes and/or Swallow Maneuvers: Seated upright 90 degrees    Other  Recommendations Oral Care Recommendations: Oral care BID   Follow Up Recommendations  24 hour supervision/assistance (pending PT eval outpatient vs SNF, ?24 hr supervision)    Frequency and Duration min 1 x/week (for swallowing, 3x a week for aphasia)  1 week   Pertinent Vitals/Pain NA    SLP Swallow Goals     Swallow Study Prior Functional  Status       General HPI: 63 year old male with a history of atrial fibrillation who was taken off xarelto because of recurrent hemoptysis and recent window procedure for pericardial effusion who presents with confusion do to multifocal left MCA territory infarct. CT reads Patchy hypoattenuation of the left posterior temporal, parietal, and occipital cortex and adjacent white matter. CXR shows infection at right lung base, left sided pna. Pt has a history of lap band.  Type of Study: Bedside swallow evaluation Previous Swallow Assessment: none Diet Prior to this Study: NPO Temperature Spikes Noted: No History of Recent Intubation: No Behavior/Cognition: Alert;Cooperative;Pleasant mood Oral Cavity - Dentition: Dentures, top;Edentulous Self-Feeding Abilities: Able to feed self Patient Positioning: Upright in bed Baseline Vocal Quality: Clear Volitional Cough: Strong Volitional Swallow: Able to elicit    Oral/Motor/Sensory Function Overall Oral Motor/Sensory Function: Impaired Labial ROM: Within Functional Limits Labial Symmetry: Abnormal symmetry right Labial Strength: Within Functional Limits Lingual ROM: Within Functional Limits Lingual Symmetry: Within Functional Limits Lingual Strength: Within Functional Limits Lingual Sensation: Within Functional Limits Facial ROM: Within Functional Limits Facial Symmetry: Within Functional Limits Facial Strength: Within Functional Limits Facial Sensation: Within Functional Limits Velum: Within Functional Limits   Ice Chips     Thin Liquid Thin Liquid: Within functional limits Presentation: Cup;Straw;Self Fed    Nectar Thick Nectar Thick Liquid: Not tested   Honey Thick Honey Thick Liquid: Not tested   Puree Puree: Within functional limits   Solid   GO    Solid: Within functional limits      [facility], MA CCC-SLP [id number]  Katherene Ponto Ilay Capshaw 01/31/2014,9:20 AM

## 2014-01-31 NOTE — Progress Notes (Signed)
Pt. off cpap upon my arrival to room for MDI.

## 2014-01-31 NOTE — Progress Notes (Signed)
Inpatient Diabetes Program Recommendations  AACE/ADA: New Consensus Statement on Inpatient Glycemic Control (2013)  Target Ranges:  Prepandial:   less than 140 mg/dL      Peak postprandial:   less than 180 mg/dL (1-2 hours)      Critically ill patients:  140 - 180 mg/dL     Results for Jon [name], Jon [name] (MRN [id number]) as of 01/31/2014 14:09  Ref. Range 01/31/2014 11:53  Glucose-Capillary Latest Range: 70-99 mg/dL 211 (H)     Admitted with CVA.  Has history of DM2, CVA, Afib, COPD, HTN.  Home DM Meds: Insulin pump  12 AM 1.6 units/hr.  6 AM 1.7 units/hr.  12:30 pm 1.7 units/hr.  6:30 pm 2.0 units/hr.   Total basal: 41.85 units  CHO ratio: 2 units:15 gms  Sensitivity factor=27  Blood glucose target: 110 mg/dl   **Spoke with patient and his wife today.  Patient's wife took patient's insulin pump home.  Patient currently does not have any insulin orders at present.  Patient's wife told me that the above insulin pump settings have not changed since the his last admission.    **Called Dr. Sherral Hammers to discuss.  Noted that patient received Levemir 20 units daily and SSI during last hospitalization while he was off his insulin pump.  Dr. Sherral Hammers gave orders to start Levemir 20 units QHS + Novolog Moderate SSI tid ac + HS.  Orders entered into [facility].   Will follow Wyn Quaker RN, MSN, CDE Diabetes Coordinator Inpatient Diabetes Program Team Pager: [phone] (8a-10p)

## 2014-01-31 NOTE — Progress Notes (Signed)
TEAM 1 - Stepdown/ICU TEAM Progress Note  Jon [name] [id number] DOB: 26-Feb-1951 DOA: 01/30/2014 PCP: Rochel Brome, MD  Admit HPI / Brief Narrative: 63 y.o. WM PMHx insulin-dependent diabetes on insulin pump, hx/o CVA, diabetic neuropathy COPD, OSA afib previously on Coumadin/xarelto,, hypertension, hyperlipidemia, morbid obesity status post gastric band presenting with stroke. Patient does have been recently admitted may 20th-29th for hemoptysis, bilateral pulmonary infiltrates, pericardial effusion, post operative AKI. Noted to have had pericardial window as well as video bronchoscopy and VATS procedure. Pt's anticoagulation d/c's secondary to recurrent hemoptysis. Please see discharge summary for full details. Pt states that he was discharged at otherwise baseline. Wife states that pt woke up this am persistently confused. Per wife, pt asked for something to eat and all he did was stare at the plate.EMS subsequently called.  Pt presented to ER hemodynamically stable. In rate controlled afib. Afebrile. Blood work essentially WNL. Had head CT that showed multifical areas of acute infarct in L hemisphere consistent with L MCA territory insult. No mass effect or hemorrhage. Neuro consulted with recommendation for stroke workup and medical admission. Pt denies any CP, SOB, hemoptysis, drainage from surgical sites.    HPI/Subjective: 6/2 patient A./O. x2 (does not know where/when). Requesting to be allowed to drink fluids, counseled that he has not passed a swallow study yet so we will only allow him to have 1- 2 ice cubes to wet his mouth  Assessment/Plan: CVA: -Positive acute CVA by CT.  MRI pending -Stroke team on board -Currently unable to anticoagulate patient secondary to his recent hemoptysis. Spoke with Dr.Sehti (stroke team) who will contact PCCM. for clearance to reinitiate anti-coagulation.  -Continue full dose aspirin, until PCCM. clears patient for  anticoagulation -Echocardiogram pending -Carotid Doppler ultrasound pending -PT/OT evaluate for SNF vs HH  HTN/AFib:  - Rate controlled currently -Continue Cardizem IV 10 mg TID -Continue torsemide 20 mg BID -Zaroxolyn 2.5 mg M/W/F -Losartan 50 mg daily -Lisinopril 40 mg BID -Imdur 60 mg daily -Hydralazine IV 10 mg q 4hr PRN  SBP > 170  HLD -Continue Lipitor 40 mg daily -Lipid panel within AHA guidelines  Hypothyroidism  -Currently not on medication will obtain TSH/free T4  Pericardial effusion/hemoptysis: - Clinically stable since hospital discharge. No acute changes on CXR. No reports of hemoptysis.  -Only recommendations from DCM on restarting anticoagulation   COPD:  -DuoNeb  q 6hr  PRN wheezing/SOB stable.  Ruthe Mannan as replacement for Advair 500-50 mcg   OSA -CPAP per respiratory  IDDM:  -Insulin pump at home will start patient on his previous hospital regimen of Levemir 20 units  QHS  -Moderate SSI  -Hemoglobin A1c pending  insulin pump in place. A1C. Monitor.     Code Status: FULL Family Communication: no family present at time of exam Disposition Plan: Resolution of CVA    Consultants: Dr. Jennet Maduro (PCCM) Dr. Roland Rack (neurology) Dr. Leonie Man (stroke team)  Procedure/Significant Events: 5/20 Admit  5/26 Lt VATS lung bx, pericardial window 5/28 Hypotensive, AKI, Lt chest tube d/c'ed  5/29: discharged  6/1: re-admitted 6/1 CXR;Worsening left-sided pneumonia and parapneumonic effusion. 6/1; head CT without contrast  - Multifocal areas of acute infarction left hemisphere, c/w left MCA territory insult. - No mass effect or hemorrhage at this time. 6/2 echocardiogram - Left ventricle: Abnormal septal motion likely from BBB.  -LVEF= 55% to 60%. - Left atrium:  moderately dilated. - Right atrium: Severely dilated. - Atrial septum: No defect or patent foramen ovale was identified. -  Pericardium, extracardiac: Moderate circumferential  pericardial effusion. No RV/RA collapse    Culture BAL 5/26 >> negative  Lung tissue 5/26 >> negative  BAL AFB 5/26: no AFB>>>   Antibiotics: Aztreonam 5/20 >> 5/25  Vancomycin 5/20 >> 5/26  Levaquin 5/26 >> off   DVT prophylaxis: SCD   Devices NA  LINES / TUBES:  6/1 20ga right arm    Continuous Infusions:    Objective: VITAL SIGNS: Temp: 98.1 F (36.7 C) (06/02 1503) Temp src: Oral (06/02 1503) BP: 152/78 mmHg (06/02 1503) Pulse Rate: 88 (06/02 1503) SPO2; 95% on room air FIO2:   Intake/Output Summary (Last 24 hours) at 01/31/14 1517 Last data filed at 01/31/14 1506  Gross per 24 hour  Intake    240 ml  Output    500 ml  Net   -260 ml     Exam: General: A./O. x2 (does not know where/when), NAD, No acute respiratory distress Lungs: Bibasilar rhonchi, negative wheezes or crackles Cardiovascular: Irregular irregular rhythm and rate, negative  murmur gallop or rub normal S1 and S2 Abdomen: Pain to palpation left lateral chest wall (secondary to healing incision for VATS) Nontender, nondistended, soft, bowel sounds positive, no rebound, no ascites, no appreciable mass Extremities: No significant cyanosis, clubbing, or edema bilateral lower extremities Neurologic; cranial nerves II through XII intact, tongue/uvula midline, extremity strength 5/5, sensation intact throughout, nose-finger-nose within normal limit, quick touch fingertips within normal limits, some expressive aphasia, did not ambulate patient  Data Reviewed: Basic Metabolic Panel:  Recent Labs Lab 01/25/14 0415 01/26/14 0430 01/27/14 0400 01/30/14 1947 01/30/14 1955  NA 137 135* 134* 142 146  K 4.1 4.3 3.8 4.1 3.9  CL 96 95* 96 105 103  CO2 27 27 27 25   --   GLUCOSE 190* 205* 140* 184* 180*  BUN 24* 35* 30* 11 9  CREATININE 1.37* 2.28* 1.23 0.96 1.00  CALCIUM 9.1 9.4 9.0 9.3  --    Liver Function Tests:  Recent Labs Lab 01/25/14 0415 01/30/14 1947  AST 25 33  ALT 23 42   ALKPHOS 79 91  BILITOT 1.4* 0.6  PROT 6.6 6.2  ALBUMIN 3.1* 2.6*   No results found for this basename: LIPASE, AMYLASE,  in the last 168 hours No results found for this basename: AMMONIA,  in the last 168 hours CBC:  Recent Labs Lab 01/25/14 0415 01/26/14 0430 01/30/14 1947 01/30/14 1955  WBC 17.9* 10.7* 9.0  --   NEUTROABS  --   --  7.2  --   HGB 15.5 13.1 13.4 14.3  HCT 46.5 41.1 39.2 42.0  MCV 83.5 85.4 81.3  --   PLT 212 183 232  --    Cardiac Enzymes: No results found for this basename: CKTOTAL, CKMB, CKMBINDEX, TROPONINI,  in the last 168 hours BNP (last 3 results)  Recent Labs  01/18/14 2150  PROBNP 627.2*   CBG:  Recent Labs Lab 01/28/14 0623 01/28/14 1140 01/30/14 1958 01/31/14 0032 01/31/14 1153  GLUCAP 149* 253* 154* 140* 211*    Recent Results (from the past 240 hour(s))  CULTURE, RESPIRATORY (NON-EXPECTORATED)     Status: None   Collection Time    01/24/14  1:05 PM      Result Value Ref Range Status   Specimen Description BRONCHIAL WASHINGS LEFT   Final   Special Requests PT ON AZACTIM   Final   Gram Stain     Final   Value: ABUNDANT WBC PRESENT,BOTH PMN AND MONONUCLEAR  FEW SQUAMOUS EPITHELIAL CELLS PRESENT     MODERATE YEAST     RARE GRAM POSITIVE COCCI     IN PAIRS   Culture     Final   Value: MODERATE CANDIDA ALBICANS     Performed at [facility]   Report Status 01/26/2014 FINAL   Final  TISSUE CULTURE     Status: None   Collection Time    01/24/14  1:32 PM      Result Value Ref Range Status   Specimen Description TISSUE LUNG LEFT   Final   Special Requests LUL LOBE BIOPSY PT ON AZACTIM   Final   Gram Stain     Final   Value: FEW WBC PRESENT, PREDOMINANTLY MONONUCLEAR     NO ORGANISMS SEEN     Performed at [facility]   Culture     Final   Value: NO GROWTH 3 DAYS     Performed at [facility]   Report Status 01/27/2014 FINAL   Final  AFB CULTURE WITH SMEAR     Status: None   Collection Time     01/24/14  1:32 PM      Result Value Ref Range Status   Specimen Description TISSUE LUNG LEFT   Final   Special Requests LUL LOBE BIOPSY PT ON AZACTIN   Final   Acid Fast Smear     Final   Value: NO ACID FAST BACILLI SEEN     Performed at [facility]   Culture     Final   Value: CULTURE WILL BE EXAMINED FOR 6 WEEKS BEFORE ISSUING A FINAL REPORT     Performed at [facility]   Report Status PENDING   Incomplete     Studies:  Recent x-ray studies have been reviewed in detail by the Attending Physician  Scheduled Meds:  Scheduled Meds: . antiseptic oral rinse  15 mL Mouth Rinse BID  . aspirin  300 mg Rectal Once  . aspirin  300 mg Rectal Daily   Or  . aspirin  325 mg Oral Daily  . atorvastatin  40 mg Oral Daily  . diltiazem  10 mg Intravenous 3 times per day  . fluticasone  1 spray Each Nare Daily  . insulin aspart  0-15 Units Subcutaneous TID WC  . insulin aspart  0-5 Units Subcutaneous QHS  . insulin detemir  20 Units Subcutaneous QHS  . isosorbide mononitrate  60 mg Oral q morning - 10a  . latanoprost  1 drop Both Eyes QHS  . lisinopril  40 mg Oral BID  . losartan  50 mg Oral Daily  . magnesium oxide  1,600 mg Oral Daily  . [START ON 02/01/2014] metolazone  2.5 mg Oral Q M,W,F  . mometasone-formoterol  2 puff Inhalation BID  . pantoprazole  40 mg Oral BID  . potassium chloride SA  20 mEq Oral TID  . pramipexole  1.5 mg Oral QHS  . pregabalin  75 mg Oral BID  . torsemide  20 mg Oral BID  . zolpidem  5 mg Oral QHS    Time spent on care of this patient: 40 mins   Allie Bossier , MD   [facility] Office  [phone] Pager [phone]  On-Call/Text Page:      [email or web address]      password [email or web address]  If 7PM-7AM, please contact night-coverage [email or web address] Password [email or web address] 01/31/2014, 3:17 PM   LOS: 1 day

## 2014-01-31 NOTE — Progress Notes (Signed)
Bilateral carotid artery duplex:  1-39% ICA stenosis.  Vertebral artery flow is antegrade.     

## 2014-01-31 NOTE — Evaluation (Signed)
Physical Therapy Evaluation Patient Details Name: Jon [name] MRN: [id number] DOB: [birth year] Today's Date: 01/31/2014   History of Present Illness  63 y.o. male with a history of recent admission for pericardial effusion who underwent a window procedure as well as hemoptysis that has been recurrent. He normally takes xarelto but was taken off of this due to the hemoptysis and need for procedure. He was discharged recently been doing well at home went to bed in his normal state of health on 5/31 but awoke on 6/1 with confusion. Multifocal areas of acute infarction are suspected in the left. MRI pending.  Clinical Impression  Patient demonstrates deficits in mobility as indicated below. Will benefit from continued skilled PT to address deficits and maximize function. Will see as indicated and progress as tolerated.     Follow Up Recommendations Home health PT;Supervision/Assistance - 24 hour    Equipment Recommendations  None recommended by PT    Recommendations for Other Services       Precautions / Restrictions Precautions Precautions: Fall Restrictions Weight Bearing Restrictions: No      Mobility  Bed Mobility Overal bed mobility: Needs Assistance Bed Mobility: Supine to Sit;Sit to Supine     Supine to sit: Supervision;HOB elevated Sit to supine: Supervision;HOB elevated      Transfers Overall transfer level: Needs assistance Equipment used: Rolling walker (2 wheeled) Transfers: Sit to/from Stand Sit to Stand: Supervision         General transfer comment: Min a with mobility  Ambulation/Gait Ambulation/Gait assistance: Min guard Ambulation Distance (Feet): 160 Feet Assistive device: Rolling walker (2 wheeled) Gait Pattern/deviations: Step-through pattern;Decreased stride length;Trunk flexed Gait velocity: decreased Gait velocity interpretation: Below normal speed for age/gender General Gait Details: fatigue with ambulation, increased instability with  fatigue  Stairs            Wheelchair Mobility    Modified Rankin (Stroke Patients Only) Modified Rankin (Stroke Patients Only) Pre-Morbid Rankin Score: No symptoms Modified Rankin: Moderately severe disability     Balance Overall balance assessment: Needs assistance Sitting-balance support: Feet supported Sitting balance-Leahy Scale: Good     Standing balance support: Bilateral upper extremity supported Standing balance-Leahy Scale: Fair                               Pertinent Vitals/Pain HR elevated with ambulation, SpO2 decreased to 91% (non uniform wave unsure accuracy) will monitor    Home Living Family/patient expects to be discharged to:: Private residence Living Arrangements: Spouse/significant other Available Help at Discharge: Family;Friend(s);Available 24 hours/day Type of Home: House Home Access: Ramped entrance     Home Layout: One level Home Equipment: Walker - 4 wheels;Walker - 2 wheels;Cane - single point      Prior Function Level of Independence: Independent with assistive device(s);Needs assistance   Gait / Transfers Assistance Needed: RW or rollator  ADL's / Homemaking Assistance Needed: assist with socks since last admission        Hand Dominance   Dominant Hand: Right    Extremity/Trunk Assessment   Upper Extremity Assessment: RUE deficits/detail RUE Deficits / Details: Strength WFL. ? r inattention         Lower Extremity Assessment: Overall WFL for tasks assessed      Cervical / Trunk Assessment: Normal  Communication   Communication: Expressive difficulties  Cognition Arousal/Alertness: Awake/alert Behavior During Therapy: Flat affect Overall Cognitive Status: Impaired/Different from baseline Area of Impairment: Orientation;Attention;Memory;Safety/judgement;Awareness;Problem solving Orientation  Level: Disoriented to;Place;Time (unable to "name" correct answer) Current Attention Level: Selective Memory:  Decreased short-term memory (unable to repeat word after 1 min delay)   Safety/Judgement: Decreased awareness of deficits Awareness: Emergent Problem Solving: Slow processing      General Comments      Exercises        Assessment/Plan    PT Assessment Patient needs continued PT services  PT Diagnosis Difficulty walking   PT Problem List Decreased activity tolerance;Decreased balance;Decreased mobility  PT Treatment Interventions DME instruction;Gait training;Functional mobility training;Therapeutic activities;Therapeutic exercise;Balance training;Patient/family education   PT Goals (Current goals can be found in the Care Plan section) Acute Rehab PT Goals Patient Stated Goal: to get better PT Goal Formulation: With patient/family Time For Goal Achievement: 02/07/14 Potential to Achieve Goals: Good    Frequency Min 3X/week   Barriers to discharge        Co-evaluation               End of Session Equipment Utilized During Treatment: Gait belt Activity Tolerance: Patient tolerated treatment well Patient left: in bed;with call bell/phone within reach;with nursing/sitter in room;with family/visitor present Nurse Communication: Mobility status         Time: [id number] PT Time Calculation (min): 18 min   Charges:   PT Evaluation $Initial PT Evaluation Tier I: 1 Procedure PT Treatments $Gait Training: 8-22 mins   PT G Codes:          [name] 01/31/2014, 3:13 PM Alben Deeds, [place] DPT  [phone]

## 2014-01-31 NOTE — Care Management Note (Signed)
    Page 1 of 2   02/01/2014     1:15:33 PM CARE MANAGEMENT NOTE 02/01/2014  Patient:  Jon [name], Jon [name]   Account Number:  [id number]  Date Initiated:  01/31/2014  Documentation initiated by:  Marvetta Gibbons  Subjective/Objective Assessment:   Pt admitted with acute CVA     Action/Plan:   PTA pt lived at home with spouse- PT eval pending - NCM to follow for recommendations   Anticipated DC Date:  02/01/2014   Anticipated DC Plan:  [place]  In-house referral  Clinical Social Worker      DC Forensic scientist  CM consult      [facility] Choice  HOME HEALTH   Choice offered to / List presented to:  C-3 Spouse        HH arranged  HH-2 PT  HH-3 OT      [place] [facility]   Status of service:  Completed, signed off Medicare Important Message given?  NA - LOS <3 / Initial given by admissions (If response is "NO", the following Medicare IM given date fields will be blank) Date Medicare IM given:   Date Additional Medicare IM given:    Discharge Disposition:  [place]  Per UR Regulation:  Reviewed for med. necessity/level of care/duration of stay  If discussed at [place] of Stay Meetings, dates discussed:    Comments:  02/01/14- 1245- Marvetta Gibbons RN BSN [phone] Pt for d/c home today with HH-PT/OT services- f/u done with pt and wife for [facility] agency choice- they have chosen [place] [facility]- call made to [facility] - spoke with Hoyle Sauer- who stated that services could not begin until Sun/Mon of next week- informed pt and wife of this- and they would like to stay with this agency and are ok with the delay with start of services- called back to [place] [facility] and spoke with Hoyle Sauer again regarding referral- they will try to see pt sooner if able since pt is a stroke pt otherwise start of services will be either Sun/Mon coming up. Pt and family agreeable to this. Faxed [place] orders, F2F,  demographics, and d/c summary to agency. Pt to be discharged home with wife- who will be available at home to assist pt.   01/31/14- [place] RN, BSN [phone] Per PT/OT HH recommended- in to speak with pt and wife at bedside regarding d/c needs- per conversation pt lives at home with wife- wife works several days a week - but plans on taking a leave of absence from work- Pt agreeable to [facility] services states that he does  better at home and does not want to go any where but home. Pt has needed DME that includes- RW, rollator, cane, BSC, nebulizer, and  home 02 and CPAP (through [employer])- list for [facility] agencies in [facility] given to pt and wife to review for choice- NCM to f/u regarding which agency they would like to use for services- MD please order [facility] PT-for safety eval and HH-OT

## 2014-01-31 NOTE — Consult Note (Signed)
Name: Jon [name] MRN: [id number] DOB: 1951-07-25    ADMISSION DATE:  01/30/2014 CONSULTATION DATE:  6/2  REFERRING MD :  wood PRIMARY SERVICE:  triad  CHIEF COMPLAINT:  Hemoptysis   BRIEF PATIENT DESCRIPTION:   63 yo former smoker, w/ h/o pericardial effusion, on Xarelto for AF, w/ h/o hemoptysis dating back to end of March 2015.  He had a FOB 4/30 with no endobronchial lesions. Admitted to [facility] from 5/05 to 5/07 for LUL infiltrate and hemoptysis. During this time he had been treated for possible PNA and xarelto was held. He is s/p left VATS and pericardial window on 5/26 w/ question of possible auto-immune process/vasculitis as cause of LUL infiltrate and pericardial effusion. D/C to home with biopsies still pending still off Xarelto. 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 PCCM was asked to re-evaluate with the question if the xarelto could be re-started.    SIGNIFICANT EVENTS:  5/20 Admit  5/26 Lt VATS lung bx, pericardial window 5/28 Hypotensive, AKI, Lt chest tube d/c'ed  5/29: discharged 6/1: re-admitted w/ left MCA CVA   STUDIES:  CT chest 5/20 >> moderate pericardial fluid, multifocal ASD b/l  5/21 auto-immune studies: ESR: 21; ANA: negative; Rheumatic factor: <79; Cyclic citrul peptide antibody, IgG: < 2.0 (neg); anti-scleroderma antibody: <1.0 (neg); Mpo/pr-3 (anca) antibodies: <1.0 (neg); ANCA screen w/ reflex titer: c-ANCA screen, p-ANCA screen and Atypical p-ANCA screen: all negative; Glomerular basement membrane antibodies: <1.0 (neg);  Urine Na 5/28 >> less than 20  CT head 6/1: "Multifocal areas of acute infarction are suspected in the left hemisphere, consistent with a left MCA territory insult. Proximal vascular thrombosis versus a shower of emboli"  By: Rolla Flatten M.D. Pericardial fluid 5/26: neg for malignancy   CULTURES:  BAL 5/26 >> negative  Lung tissue 5/26 >> negative  BAL AFB 5/26: no AFB>>>  ANTIBIOTICS:  Aztreonam  5/20 >> 5/25  Vancomycin 5/20 >> 5/26  Levaquin 5/26 >> off   HISTORY OF PRESENT ILLNESS:    63 yo former smoker admitted to [facility] from 5/05 to 5/07 for LUL infiltrate and hemoptysis. He was on xarelto for A fib also has hx of pericardial effusion.  During this time he had been treated for possible PNA and xarelto was held.  He had a FOB 4/30 with no endobronchial lesions.  on At this point given the additional question of pericardial effusion and  possible autoimmune process and vasculitis thoracic surgery was consulted for pericardial window. He underwent a left VATS and pericardial window on 5/26. He was discharged to home with biopsies still pending. 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 PCCM was asked to re-evaluate with the question if the xarelto could be re-started.   PAST MEDICAL HISTORY :  Past Medical History  Diagnosis Date  . Hypothyroidism   . Dysrhythmia     atrial fib takes cardizem,   . Hypertension   . Angina   . Restless leg syndrome   . GERD (gastroesophageal reflux disease)   . Hyperlipemia   . H/O hiatal hernia   . Neuromuscular disorder     rls, neuropathy  . Obesity (BMI 30-39.9)   . Pericardial effusion   . Asthma   . Cough     coughing up blood- started last nite, seems to be worse now  . Atrial fibrillation   . Myocardial infarction     "one dr says yes; another says no"  .  On home oxygen therapy     "2L w/CPAP at bedtime" (01/18/2014)  . Pneumonia 5/15    "several times" (01/18/2014)  . OSA on CPAP     cpap & oxygen  . IDDM (insulin dependent diabetes mellitus)     insulin pump   . Stroke 2012    denies residual on 01/18/2014  . Arthritis     "knees" (01/18/2014)  . Fibromyalgia    Past Surgical History  Procedure Laterality Date  . Cataract extraction w/ intraocular lens  implant, bilateral Bilateral   . Sinus exploration  X 2  . Appendectomy    . Cholecystectomy    . Laparoscopic gastric banding  2010  .  Video bronchoscopy Bilateral 12/29/2013    Procedure: VIDEO BRONCHOSCOPY WITHOUT FLUORO;  Surgeon: Elsie Stain, MD;  Location: [facility] END[facility]COPY;  Service: Endoscopy;  Laterality: Bilateral;  . Cardiac catheterization  X 2 then 03/03/2012     NL LVF, normal coronaries, vessels are small (HPR: Dr. Beatrix Fetters)  . Hernia repair      UHR  . Umbilical hernia repair    . Video assisted thoracoscopy Left 01/24/2014    Procedure: VIDEO ASSISTED THORACOSCOPY;  Surgeon: Gaye Pollack, MD;  Location[facility] OR;  Service: Thoracic;  Laterality: Left;  VATS/open lung biopsy  . Pericardial window Left 01/24/2014    Procedure: PERICARDIAL WINDOW;  Surgeon: Gaye Pollack, MD;  Location: [place];  Service: Thoracic;  Laterality: Left;  . Video bronchoscopy N/A 01/24/2014    Procedure: VIDEO BRONCHOSCOPY;  Surgeon: Gaye Pollack, MD;  Location: [facility] OR;  Service: Thoracic;  Laterality: N/A;   Prior to Admission medications   Medication Sig Start Date End Date Taking? Authorizing Provider  albuterol (PROVENTIL HFA;VENTOLIN HFA) 108 (90 BASE) MCG/ACT inhaler Inhale 2 puffs into the lungs every 6 (six) hours as needed for wheezing or shortness of breath.   Yes Historical Provider, MD  atorvastatin (LIPITOR) 40 MG tablet Take 40 mg by mouth daily.   Yes Historical Provider, MD  azelastine (ASTELIN) 137 MCG/SPRAY nasal spray Place 1 spray into both nostrils at bedtime as needed.    Yes Historical Provider, MD  Calcium Carbonate-Vitamin D (CALTRATE 600+D PO) Take 2 tablets by mouth 2 (two) times daily.    Yes Historical Provider, MD  diltiazem (CARDIZEM) 30 MG tablet Take 30 mg by mouth 2 (two) times daily.   Yes Historical Provider, MD  fluticasone (FLONASE) 50 MCG/ACT nasal spray Place 1 spray into both nostrils daily.   Yes Historical Provider, MD  Fluticasone-Salmeterol (ADVAIR) 500-50 MCG/DOSE AEPB Inhale 1 puff into the lungs every 12 (twelve) hours.   Yes Historical Provider, MD  hydrALAZINE (APRESOLINE) 50 MG tablet Take  50 mg by mouth 4 (four) times daily.   Yes Historical Provider, MD  ipratropium-albuterol (DUONEB) 0.5-2.5 (3) MG/3ML SOLN Take 3 mLs by nebulization every 6 (six) hours as needed. For shortness of breath   Yes Historical Provider, MD  isosorbide mononitrate (IMDUR) 60 MG 24 hr tablet Take 60 mg by mouth every morning.   Yes Historical Provider, MD  latanoprost (XALATAN) 0.005 % ophthalmic solution Place 1 drop into both eyes at bedtime.   Yes Historical Provider, MD  lisinopril (PRINIVIL,ZESTRIL) 40 MG tablet Take 40 mg by mouth 2 (two) times daily.   Yes Historical Provider, MD  losartan (COZAAR) 50 MG tablet Take 50 mg by mouth daily.   Yes Historical Provider, MD  magnesium oxide (MAG-OX) 400 MG tablet Take 1,600 mg  by mouth daily.    Yes Historical Provider, MD  metolazone (ZAROXOLYN) 2.5 MG tablet Take 2.5 mg by mouth See admin instructions. Patient takes only on Monday Wednesday Friday. Take 30 minutes before furosemide   Yes Historical Provider, MD  Multiple Vitamins-Minerals (CENTRUM SILVER PO) Take 1 tablet by mouth daily.   Yes Historical Provider, MD  oxyCODONE-acetaminophen (PERCOCET/ROXICET) 5-325 MG per tablet Take 1 tablet by mouth 4 (four) times daily as needed for severe pain. For severe pain 01/28/14  Yes Donielle Liston Alba, PA-C  pantoprazole (PROTONIX) 40 MG tablet Take 40 mg by mouth 2 (two) times daily.   Yes Historical Provider, MD  Polyethyl Glycol-Propyl Glycol (SYSTANE PRESERVATIVE FREE) 0.4-0.3 % SOLN Apply 1 drop to eye 4 (four) times daily as needed (dryness of eye).    Yes Historical Provider, MD  potassium chloride SA (K-DUR,KLOR-CON) 20 MEQ tablet Take 20 mEq by mouth 3 (three) times daily.   Yes Historical Provider, MD  pramipexole (MIRAPEX) 1.5 MG tablet Take 1.5 mg by mouth at bedtime.   Yes Historical Provider, MD  pregabalin (LYRICA) 75 MG capsule Take 1 capsule (75 mg total) by mouth 2 (two) times daily. 01/05/14  Yes Grace Bushy Minor, NP  torsemide (DEMADEX) 20 MG  tablet Take 20 mg by mouth 2 (two) times daily.   Yes Historical Provider, MD  triamcinolone (NASACORT) 55 MCG/ACT nasal inhaler Place 2 sprays into both nostrils daily.    Yes Historical Provider, MD  zolpidem (AMBIEN) 10 MG tablet Take 10 mg by mouth at bedtime.   Yes Historical Provider, MD  Insulin Human (INSULIN PUMP) 100 unit/ml SOLN Inject into the skin continuous. Inject 0.5 units of novolog every hour per insulin pump.    Historical Provider, MD  nitroGLYCERIN (NITROSTAT) 0.4 MG SL tablet Place 0.4 mg under the tongue every 5 (five) minutes as needed. For chest pain    Historical Provider, MD  NOVOLOG 100 UNIT/ML injection As directed via insulin pump    Historical Provider, MD   Allergies  Allergen Reactions  . Codeine Itching  . Penicillins Rash    FAMILY HISTORY:  Family History  Problem Relation Age of Onset  . Anesthesia problems Neg Hx   . Hypotension Neg Hx   . Malignant hyperthermia Neg Hx   . Pseudochol deficiency Neg Hx   . Cancer Mother    SOCIAL HISTORY:  reports that he quit smoking about 7 years ago. His smoking use included Cigars. He has never used smokeless tobacco. He reports that he drinks alcohol. He reports that he does not use illicit drugs.  REVIEW OF SYSTEMS:   Constitutional: Negative for fever, chills, weight loss, malaise/fatigue and diaphoresis.  HENT: Negative for hearing loss, ear pain, nosebleeds, congestion, sore throat, neck pain, tinnitus and ear discharge.   Eyes: Negative for blurred vision, double vision, photophobia, pain, discharge and redness.  Respiratory: Negative for cough, hemoptysis resolved since last admit off xarelto, sputum production, shortness of breath, wheezing and stridor.   Cardiovascular: Negative for chest pain, palpitations, orthopnea, claudication, leg swelling and PND.  Gastrointestinal: Negative for heartburn, nausea, vomiting, abdominal pain, diarrhea, constipation, blood in stool and melena.  Genitourinary:  Negative for dysuria, urgency, frequency, hematuria and flank pain.  Musculoskeletal: Negative for myalgias, back pain, joint pain and falls.  Skin: Negative for itching and rash.  Neurological: Negative for dizziness, tingling, tremors, sensory change, speech change, expressive aphasia  focal weakness, seizures, loss of consciousness, weakness and headaches.  Endo/Heme/Allergies: Negative for  environmental allergies and polydipsia. Does not bruise/bleed easily.  SUBJECTIVE:  No acute distress  VITAL SIGNS: Temp:  [97.6 F (36.4 C)-98.6 F (37 C)] 98.2 F (36.8 C) (06/02 0744) Pulse Rate:  [72-105] 91 (06/02 0751) Resp:  [14-31] 24 (06/02 0751) BP: (131-166)/(67-98) 153/73 mmHg (06/02 0749) SpO2:  [92 %-100 %] 95 % (06/02 0751) Weight:  [104.7 kg (230 lb 13.2 oz)] 104.7 kg (230 lb 13.2 oz) (06/02 0002)  PHYSICAL EXAMINATION: General:  Awake, no acute distress.  Neuro:  Awake, oriented. Does have some expressive aphasia  HEENT:  [place], no JVD Cardiovascular:  Reg-irreg  Lungs:  Scattered rhonchi improve w/ cough  Abdomen:  Nt, + bs, soft  Musculoskeletal:  Intact  Skin:  Intact    Recent Labs Lab 01/26/14 0430 01/27/14 0400 01/30/14 1947 01/30/14 1955  NA 135* 134* 142 146  K 4.3 3.8 4.1 3.9  CL 95* 96 105 103  CO2 '27 27 25  ' --   BUN 35* 30* 11 9  CREATININE 2.28* 1.23 0.96 1.00  GLUCOSE 205* 140* 184* 180*    Recent Labs Lab 01/25/14 0415 01/26/14 0430 01/30/14 1947 01/30/14 1955  HGB 15.5 13.1 13.4 14.3  HCT 46.5 41.1 39.2 42.0  WBC 17.9* 10.7* 9.0  --   PLT 212 183 232  --    Dg Chest 2 View  01/30/2014   CLINICAL DATA:  Stroke protocol.  EXAM: CHEST  2 VIEW  COMPARISON:  01/28/2014  FINDINGS: Increasing opacification of the left mid and lower chest. This is likely combination of increased pneumonia and pleural effusion, which is small in volume. No definitive signs of pleural fluid loculation. Stable cardiopericardial enlargement in this patient with recently  demonstrated pericardial effusion. The right lung is relatively well aerated, although there is likely infection at the right base. Lap band.  IMPRESSION: Worsening left-sided pneumonia and parapneumonic effusion.   Electronically Signed   By: Jorje Guild M.D.   On: 01/30/2014 23:19   Ct Head Wo Contrast  01/30/2014   CLINICAL DATA:  Altered mental status.  Sudden onset of confusion.  EXAM: CT HEAD WITHOUT CONTRAST  TECHNIQUE: Contiguous axial images were obtained from the base of the skull through the vertex without contrast.  COMPARISON:  10/28/2010 CT head from [facility].  FINDINGS: Generalized atrophy. Chronic microvascular ischemic change. No hemorrhage, mass lesion, or hydrocephalus.  Patchy hypoattenuation of the left posterior temporal, parietal, and occipital cortex and adjacent white matter, likely representing acute infarction. Further anteriorly, possible acute infarction involving left anterior frontal cortex (image 21). No convincing CT signs of proximal vascular thrombosis although moderate vascular calcification is observed in bilateral carotid siphons. Slight hyperattenuation just dorsal to the left thalamus is felt to represent choroid plexus, not acute hemorrhage, when correlated with previous CT examinations.  Calvarium intact. Chronically disease bilateral maxillary sinuses without acute air-fluid level. Previous maxillary sinus surgery. Bilateral cataract extraction.  IMPRESSION: Multifocal areas of acute infarction are suspected in the left hemisphere, consistent with a left MCA territory insult. Proximal vascular thrombosis versus a shower of emboli could be associated with these widely separated areas of cortical and subcortical cytotoxic edema. No mass effect or hemorrhage at this time.   Electronically Signed   By: Rolla Flatten M.D.   On: 01/30/2014 21:17  some worsening left effusion/airspace disease   ASSESSMENT / PLAN: New left MCA CVA Plan  Ok to start ASA from our  stand-point  If OK from thoracic surgery could re-trial xarelto from  our stand-point when neuro thinks no longer risk of hemorrhagic   Recurrent hemoptysis with pulmonary infiltrates.  S/p Lt VATS 5/26.  bx results still pending. Auto-immune work-up negative thus far. Wonder if this was simply a PNA (NOS) and hemoptysis from PNA in setting of  xarelto /asa  Plan  F/u biopsy results   No utility in repeat FOB (airway has been evaluated on 4/30 by Dr [name] and again during the VATS on 5/26 by  Dr Cyndia Bent both times the airway was clear of endo-bronchial lesions)  Hx of smoking.  Hx of OSA/OHS.  Plan:   CPAP with O2 at night   Continue Duelra   Bronchial hygiene    Pericardial effusion s/p window 5/26 >> reactive mesothelial cells/ no malignant cells.  Hx of HTN, A fib, hyperlipidemia.  Plan:   Cont home medical rx (see MAR)  DM - insulin pump at home.  Plan:   SSI   Patient seen and examined, agree with above note.  I dictated the care and orders written for this patient under my direction.  Rush Farmer, MD [phone]

## 2014-01-31 NOTE — Progress Notes (Signed)
Utilization review completed.  

## 2014-01-31 NOTE — Progress Notes (Signed)
Occupational Therapy Evaluation Patient Details Name: Jon [name] MRN: [id number] DOB: 1951/02/03 Today's Date: 01/31/2014    History of Present Illness 63 y.o. male with a history of recent admission for pericardial effusion who underwent a window procedure as well as hemoptysis that has been recurrent. He normally takes xarelto but was taken off of this due to the hemoptysis and need for procedure. He was discharged recently been doing well at home went to bed in his normal state of health on 5/31 but awoke on 6/1 with confusion.  Per CT scan - Multifocal areas of acute infarction are suspected in the left MCA area - embolic. MRI pending.   Clinical Impression   PTA, pt mod I with mobility and min A with LB ADL due to recent hospitalization. Pt presents with apparent R homonymous hemianopsia, in addition to deficits below. Wife reports that she will be able to stay home with pt and provide 24/7 S. If pt continues to progress, feel pt will be able to D/C home with 24/7 S and [place]. Feel home environment will be very beneficial for pt to receive services given his language and visual deficits. Will follow to facilitate D/C home.     Follow Up Recommendations  Home health OT;Supervision/Assistance - 24 hour    Equipment Recommendations  Tub/shower seat  - will further assess   Recommendations for Other Services       Precautions / Restrictions Precautions Precautions: Fall      Mobility Bed Mobility Overal bed mobility: Needs Assistance Bed Mobility: Supine to Sit;Sit to Supine     Supine to sit: Supervision;HOB elevated Sit to supine: Supervision;HOB elevated      Transfers Overall transfer level: Needs assistance   Transfers: Sit to/from Stand Sit to Stand: Supervision         General transfer comment: Min a with mobility    Balance Overall balance assessment: Needs assistance Sitting-balance support: Feet supported Sitting balance-Leahy Scale: Good      Standing balance support: Bilateral upper extremity supported;During functional activity Standing balance-Leahy Scale: Fair                              ADL Overall ADL's : Needs assistance/impaired Eating/Feeding: Supervision/ safety;Set up   Grooming: Set up;Supervision/safety;Sitting   Upper Body Bathing: Supervision/ safety;Set up   Lower Body Bathing: Minimal assistance;Sit to/from stand   Upper Body Dressing : Minimal assistance   Lower Body Dressing: Moderate assistance;Sit to/from stand   Toilet Transfer: Minimal assistance   Toileting- Water quality scientist and Hygiene: Min guard       Functional mobility during ADLs: Minimal assistance (Used HHA. Pt states he feels "wobbly")       Vision Eye Alignment: Within Functional Limits Alignment/Gaze Preference: Other (comment) (L bias) Ocular Range of Motion: Within Functional Limits Tracking/Visual Pursuits: Decreased smoothness of horizontal tracking;Decreased smoothness of vertical tracking Saccades: Additional eye shifts occurred during testing;Additional head turns occurred during testing;Decreased speed of saccadic movement Convergence: Within functional limits         Perception     Praxis Praxis Praxis tested?:  (will further assess)    Pertinent Vitals/Pain VSS No c/o pain     Hand Dominance Right   Extremity/Trunk Assessment Upper Extremity Assessment Upper Extremity Assessment: RUE deficits/detail RUE Deficits / Details: Strength WFL. ? r inattention   Lower Extremity Assessment Lower Extremity Assessment: Defer to PT evaluation   Cervical / Trunk Assessment Cervical /  Trunk Assessment: Normal   Communication Communication Communication: Expressive difficulties   Cognition Arousal/Alertness: Awake/alert Behavior During Therapy: Flat affect Overall Cognitive Status: Impaired/Different from baseline Area of Impairment:  Orientation;Attention;Memory;Safety/judgement;Awareness;Problem solving Orientation Level: Disoriented to;Place;Time (unable to "name" correct answer) Current Attention Level: Selective Memory: Decreased short-term memory (unable to repeat word after 1 min delay)   Safety/Judgement: Decreased awareness of deficits Awareness: Emergent Problem Solving: Slow processing     General Comments       Exercises       Shoulder Instructions      Home Living Family/patient expects to be discharged to:: Private residence Living Arrangements: Spouse/significant other Available Help at Discharge: Family;Friend(s);Available 24 hours/day Type of Home: House Home Access: Ramped entrance     Home Layout: One level     Bathroom Shower/Tub: International aid/development worker Accessibility: Yes How Accessible: Accessible via walker Home Equipment: [place] - 4 wheels;Walker - 2 wheels;Kasandra Knudsen - single point      Lives With: Spouse    Prior Functioning/Environment Level of Independence: Independent with assistive device(s);Needs assistance  Gait / Transfers Assistance Needed: RW or rollator ADL's / Homemaking Assistance Needed: assist with socks since last admission        OT Diagnosis: Generalized weakness;Cognitive deficits;Disturbance of vision   OT Problem List: Impaired balance (sitting and/or standing);Impaired vision/perception;Decreased cognition;Decreased safety awareness;Obesity   OT Treatment/Interventions: Self-care/ADL training;Therapeutic exercise;Neuromuscular education;DME and/or AE instruction;Therapeutic activities;Cognitive remediation/compensation;Visual/perceptual remediation/compensation;Patient/family education;Balance training    OT Goals(Current goals can be found in the care plan section) Acute Rehab OT Goals Patient Stated Goal: to get better OT Goal Formulation: With patient Time For Goal Achievement: 02/14/14 Potential to Achieve Goals: Good  OT Frequency: Min  2X/week   Barriers to D/C: Other (comment) (wife works but plans to take a LOA)          Co-evaluation              End of Session Nurse Communication: Mobility status  Activity Tolerance: Patient tolerated treatment well Patient left: in bed;with call bell/phone within reach;with bed alarm set;with family/visitor present   Time: 1120-1145 OT Time Calculation (min): 25 min Charges:  OT General Charges $OT Visit: 1 Procedure OT Evaluation $Initial OT Evaluation Tier I: 1 Procedure OT Treatments $Self Care/Home Management : 8-22 mins G-Codes:    Roney Jaffe Lawrance Wiedemann 2014-02-17, 1:23 PM   [facility], OTR/L  [phone] 02-17-2014

## 2014-01-31 NOTE — Consult Note (Signed)
Neurology Consultation Reason for Consult: Stroke Referring Physician: Romona Curls  CC: Confusion  History is obtained from: Patient  HPI: Jon [name] is a 63 y.o. male with a history of recent admission for pericardial effusion who underwent a window procedure as well as hemoptysis that has been recurrent. He normally takes xarelto but was taken off of this due to the hemoptysis and need for procedure. He was discharged recently been doing well at home went to bed in his normal state of health on 5/31 but awoke on 6/1 with confusion. He remained confused throughout the day and therefore finally sought care in the emergency department.   LKW: 5/31, bedtime tpa given?: no, outside of window NIHSS: 1 4 mild aphasia   ROS: A 14 point ROS was performed and is negative except as noted in the HPI.  Past Medical History  Diagnosis Date  . Hypothyroidism   . Dysrhythmia     atrial fib takes cardizem,   . Hypertension   . Angina   . Restless leg syndrome   . GERD (gastroesophageal reflux disease)   . Hyperlipemia   . H/O hiatal hernia   . Neuromuscular disorder     rls, neuropathy  . Obesity (BMI 30-39.9)   . Pericardial effusion   . Asthma   . Cough     coughing up blood- started last nite, seems to be worse now  . Atrial fibrillation   . Myocardial infarction     "one dr says yes; another says no"  . On home oxygen therapy     "2L w/CPAP at bedtime" (01/18/2014)  . Pneumonia 5/15    "several times" (01/18/2014)  . OSA on CPAP     cpap & oxygen  . IDDM (insulin dependent diabetes mellitus)     insulin pump   . Stroke 2012    denies residual on 01/18/2014  . Arthritis     "knees" (01/18/2014)  . Fibromyalgia     Family History: No history of stroke  Social History: Tob: Denies  Exam: Current vital signs: BP 166/98  Pulse 84  Temp(Src) 98.6 F (37 C) (Oral)  Resp 23  Ht 5\' 8"  (1.727 m)  Wt 104.7 kg (230 lb 13.2 oz)  BMI 35.10 kg/m2  SpO2 97% Vital signs in  last 24 hours: Temp:  [97.6 F (36.4 C)-98.6 F (37 C)] 98.6 F (37 C) (06/02 0002) Pulse Rate:  [72-101] 84 (06/02 0002) Resp:  [14-27] 23 (06/02 0002) BP: (131-166)/(67-98) 166/98 mmHg (06/02 0002) SpO2:  [92 %-100 %] 97 % (06/02 0002) Weight:  [104.7 kg (230 lb 13.2 oz)] 104.7 kg (230 lb 13.2 oz) (06/02 0002)  General: In bed, NAD CV: Regular rate and rhythm Mental Status: Patient is awake, alert, oriented to person, place, month, year, and situation. Immediate and remote memory are intact. Patient is able to give a clear and coherent history. No signs of neglect He has difficulty with naming objects, and makes word substitutions during the interview. Also, I have to repeat things on several occasions. Cranial Nerves: II: Visual Fields are full with the possible exception of an mild right upper field cut. Pupils are equal, round, and reactive to light.  Discs are difficult to visualize. III,IV, VI: EOMI without ptosis or diploplia.  V: Facial sensation is symmetric to temperature VII: Facial movement is symmetric.  VIII: hearing is intact to voice X: Uvula elevates symmetrically XI: Shoulder shrug is symmetric. XII: tongue is midline without atrophy or fasciculations.  Motor: Tone is normal. Bulk is normal. 5/5 strength was present in all four extremities with the exception of right hand grip which is weak, no drift was present Sensory: Sensation is symmetric to light touch and temperature in the arms and legs. Deep Tendon Reflexes: 2+ and symmetric in the biceps and patellae.  Plantars: Toes are downgoing bilaterally.  Cerebellar: FNF  intact bilaterally Gait: Not tested due to patient safety concerns.     I have reviewed labs in epic and the results pertinent to this consultation are: CMP-elevated glucose, low albumin  I have reviewed the images obtained: CT head-multifocal left MCA territory infarcts  Impression: 63 year old male with a history of atrial  fibrillation who was taken off is a relative because of recurrent hemoptysis who presents with confusion do to multifocal left MCA territory infarct. I suspect he had an embolus which broke up and showered.  The decision to restart anticoagulation will need to be made in conjunction with pulmonary as well as cardiothoracic surgery in the interim, I would favor restarting aspirin.  Recommendations: 1. HgbA1c, fasting lipid panel 2. MRI, MRA  of the brain without contrast 3. Frequent neuro checks 4. Echocardiogram 5. Carotid dopplers 6. Prophylactic therapy-Antiplatelet med: Aspirin - dose 325mg  PO or 300mg  PR 7. Risk factor modification 8. Telemetry monitoring 9. PT consult, OT consult, Speech consult 10. Were discussed with pulmonary and cardiothoracic surgery when anticoagulation could be feasibly restarted, and if it could be restarted due to the hemoptysis. I would not start immediately from a stroke perspective given there were be some chance of hemorrhagic conversion.    Roland Rack, MD [facility] [phone]  If 7pm- 7am, please page neurology on call as listed in [place].

## 2014-01-31 NOTE — Progress Notes (Signed)
  Echocardiogram 2D Echocardiogram has been performed.  Carney Corners 01/31/2014, 2:28 PM

## 2014-01-31 NOTE — Progress Notes (Signed)
Stroke Team Progress Note  HISTORY Jon [name] is a 63 y.o. male with a history of recent admission for pericardial effusion who underwent a window procedure as well as hemoptysis that has been recurrent. He normally takes xarelto but was taken off of this due to the hemoptysis and need for procedure. He was discharged recently been doing well at home went to bed in his normal state of health on 5/31 until bedtime but awoke on 6/1 with confusion. He remained confused throughout the day and therefore finally sought care in the emergency department.  Patient was not administered TPA secondary to delay in arrival. He was admitted for further evaluation and treatment.  SUBJECTIVE His ST is at the bedside, no family present.    OBJECTIVE Most recent Vital Signs: Filed Vitals:   01/31/14 0744 01/31/14 0749 01/31/14 0750 01/31/14 0751  BP:  153/73    Pulse:  95 94 91  Temp: 98.2 F (36.8 C)     TempSrc: Oral     Resp:  23 20 24   Height:      Weight:      SpO2:  96% 95% 95%   CBG (last 3)   Recent Labs  01/28/14 1140 01/30/14 1958 01/31/14 0032  GLUCAP 253* 154* 140*    IV Fluid Intake:   . insulin pump      MEDICATIONS  . aspirin  300 mg Rectal Once  . aspirin  300 mg Rectal Daily   Or  . aspirin  325 mg Oral Daily  . atorvastatin  40 mg Oral Daily  . diltiazem  10 mg Intravenous 3 times per day  . fluticasone  1 spray Each Nare Daily  . isosorbide mononitrate  60 mg Oral q morning - 10a  . latanoprost  1 drop Both Eyes QHS  . lisinopril  40 mg Oral BID  . losartan  50 mg Oral Daily  . magnesium oxide  1,600 mg Oral Daily  . [START ON 02/01/2014] metolazone  2.5 mg Oral Q M,W,F  . mometasone-formoterol  2 puff Inhalation BID  . pantoprazole  40 mg Oral BID  . potassium chloride SA  20 mEq Oral TID  . pramipexole  1.5 mg Oral QHS  . pregabalin  75 mg Oral BID  . torsemide  20 mg Oral BID  . zolpidem  5 mg Oral QHS   PRN:  hydrALAZINE, ipratropium-albuterol,  nitroGLYCERIN, oxyCODONE-acetaminophen, polyvinyl alcohol, senna-docusate  Diet:  NPO  Activity:   DVT Prophylaxis:  SCDs   CLINICALLY SIGNIFICANT STUDIES Basic Metabolic Panel:   Recent Labs Lab 01/27/14 0400 01/30/14 1947 01/30/14 1955  NA 134* 142 146  K 3.8 4.1 3.9  CL 96 105 103  CO2 27 25  --   GLUCOSE 140* 184* 180*  BUN 30* 11 9  CREATININE 1.23 0.96 1.00  CALCIUM 9.0 9.3  --    Liver Function Tests:   Recent Labs Lab 01/25/14 0415 01/30/14 1947  AST 25 33  ALT 23 42  ALKPHOS 79 91  BILITOT 1.4* 0.6  PROT 6.6 6.2  ALBUMIN 3.1* 2.6*   CBC:   Recent Labs Lab 01/26/14 0430 01/30/14 1947 01/30/14 1955  WBC 10.7* 9.0  --   NEUTROABS  --  7.2  --   HGB 13.1 13.4 14.3  HCT 41.1 39.2 42.0  MCV 85.4 81.3  --   PLT 183 232  --    Coagulation:   Recent Labs Lab 01/30/14 1947  LABPROT 13.4  INR 1.04   Cardiac Enzymes: No results found for this basename: CKTOTAL, CKMB, CKMBINDEX, TROPONINI,  in the last 168 hours Urinalysis:   Recent Labs Lab 01/26/14 1130 01/30/14 2134  COLORURINE YELLOW AMBER*  LABSPEC 1.021 1.019  PHURINE 5.0 8.0  GLUCOSEU NEGATIVE 250*  HGBUR NEGATIVE NEGATIVE  BILIRUBINUR SMALL* NEGATIVE  KETONESUR NEGATIVE NEGATIVE  PROTEINUR NEGATIVE NEGATIVE  UROBILINOGEN 0.2 0.2  NITRITE NEGATIVE NEGATIVE  LEUKOCYTESUR TRACE* NEGATIVE   Lipid Panel    Component Value Date/Time   CHOL 91 01/31/2014 0337   TRIG 69 01/31/2014 0337   HDL 31* 01/31/2014 0337   CHOLHDL 2.9 01/31/2014 0337   VLDL 14 01/31/2014 0337   LDLCALC 46 01/31/2014 0337   HgbA1C  No results found for this basename: HGBA1C    Urine Drug Screen:     Component Value Date/Time   LABOPIA POSITIVE* 01/30/2014 2134   COCAINSCRNUR NONE DETECTED 01/30/2014 2134   LABBENZ NONE DETECTED 01/30/2014 2134   AMPHETMU NONE DETECTED 01/30/2014 2134   THCU NONE DETECTED 01/30/2014 2134   LABBARB NONE DETECTED 01/30/2014 2134    Alcohol Level:   Recent Labs Lab 01/30/14 1947  ETH <11      CT of the brain  01/30/2014    Multifocal areas of acute infarction are suspected in the left hemisphere, consistent with a left MCA territory insult. Proximal vascular thrombosis versus a shower of emboli could be associated with these widely separated areas of cortical and subcortical cytotoxic edema. No mass effect or hemorrhage at this time.  MRI of the brain    MRA of the brain    2D Echocardiogram  EF 55-60% with no source of embolus.   Carotid Doppler  No evidence of hemodynamically significant internal carotid artery stenosis. Vertebral artery flow is antegrade.   CXR  01/30/2014   Worsening left-sided pneumonia and parapneumonic effusion.    EKG  atrial fibrillation, rate 87. For complete results please see formal report.   Therapy Recommendations HH PT, OT   Physical Exam   Pleasant middle aged 24 male sitting up in bedside chair.Awake alert. Afebrile. Head is nontraumatic. Neck is supple without bruit. Hearing is normal. Cardiac exam no murmur or gallop. Lungs are clear to auscultation. Distal pulses are well felt. Neurological Exam : Awake alert oriented x3. Mild nonfluent speech with paraphasic errors and word finding difficulties. Good comprehension, naming and repetition. Follows commands well. Fundi were not visualized. Vision acuity seems adequate. Partial right homonymous hemianopsia. Face is symmetric tongue midline. Motor system exam no upper or lower eczema to drift. Symmetric and equal strength in all 4 extremities. Gait was not tested. ASSESSMENT Jon [name] is a 63 y.o. male presenting with new onset confusion. Imaging confirms patchy left MCA infarcts. Infarcts felt to be embolic secondary to known atrial fibrillation.  Typically on xarelto, however had not been taking due to recent surgery and hemoptysis; on no antithrombotics prior to admission. Now on aspirin 325 mg orally every day for secondary stroke prevention. Patient with resultant expressive  and receptive aphaisa, dense right HH. Stroke work up underway.  atrial fibrillation. Taken off anticoagulation due to recurrent hemoptysis hypertension Hyperlipidemia, LDL 46, on lipitor 40 mg PTA, now on lipitor 40 mg daily, goal LDL < 100 (< 70 for diabetics) Diabetes, HgbA1c pending, goal < 7.0 obstructive sleep apnea, on CPAP at home Obesity, Body mass index is 35.1 kg/(m^2).   Hx MI  Fibromyalgia  Hospital day # 1  TREATMENT/PLAN  Continue  aspirin 325 mg orally every day for secondary stroke prevention for now. Consider change to eliquis for secondary stroke prevention once stable for anticoagulants given hx hemoptysis and if ok with Dr Cyndia Bent CVTS Dr. Leonie Man discussed diagnosis, prognosis,  treatment options and plan of care with patient, Dr. Sherral Hammers and Dr. Chase Caller.    SIGNED Burnetta Sabin, MSN, RN, ANVP-BC, ANP-BC, GNP-BC [facility] Pager: [phone] 01/31/2014 5:58 PM   I have personally obtained a history, examined the patient, evaluated imaging results, and formulated the assessment and plan of care. I agree with the above. Antony Contras, MD   To contact Stroke Continuity provider, please refer to [email or web address] After hours, contact General Neurology

## 2014-01-31 NOTE — Evaluation (Signed)
Speech Language Pathology Evaluation Patient Details Name: Jon [name] MRN: [id number] DOB: November 06, 1950 Today's Date: 01/31/2014 Time: [id number] SLP Time Calculation (min): 49 min  Problem List:  Patient Active Problem List   Diagnosis Date Noted  . CVA (cerebral infarction) 01/30/2014  . AKI (acute kidney injury) 01/26/2014  . Pulmonary alveolar hemorrhage 01/20/2014  . Pericardial effusion 01/04/2014  . Atrial fibrillation 01/03/2014  . Hemoptysis 12/21/2013  . Obstructive chronic bronchitis without exacerbation 12/21/2013  . Type II or unspecified type diabetes mellitus with neurological manifestations, not stated as uncontrolled 12/21/2013  . Hypothyroidism   . Dysrhythmia   . Hypertension   . History of stroke   . Restless leg syndrome   . GERD (gastroesophageal reflux disease)   . Sleep apnea   . Hyperlipemia   . Neuropathy   . Obesity (BMI 30-39.9)   . Bariatric surgery status 11/03/2013   Past Medical History:  Past Medical History  Diagnosis Date  . Hypothyroidism   . Dysrhythmia     atrial fib takes cardizem,   . Hypertension   . Angina   . Restless leg syndrome   . GERD (gastroesophageal reflux disease)   . Hyperlipemia   . H/O hiatal hernia   . Neuromuscular disorder     rls, neuropathy  . Obesity (BMI 30-39.9)   . Pericardial effusion   . Asthma   . Cough     coughing up blood- started last nite, seems to be worse now  . Atrial fibrillation   . Myocardial infarction     "one dr says yes; another says no"  . On home oxygen therapy     "2L w/CPAP at bedtime" (01/18/2014)  . Pneumonia 5/15    "several times" (01/18/2014)  . OSA on CPAP     cpap & oxygen  . IDDM (insulin dependent diabetes mellitus)     insulin pump   . Stroke 2012    denies residual on 01/18/2014  . Arthritis     "knees" (01/18/2014)  . Fibromyalgia    Past Surgical History:  Past Surgical History  Procedure Laterality Date  . Cataract extraction w/ intraocular lens   implant, bilateral Bilateral   . Sinus exploration  X 2  . Appendectomy    . Cholecystectomy    . Laparoscopic gastric banding  2010  . Video bronchoscopy Bilateral 12/29/2013    Procedure: VIDEO BRONCHOSCOPY WITHOUT FLUORO;  Surgeon: Elsie Stain, MD;  Location: [facility];  Service: Endoscopy;  Laterality: Bilateral;  . Cardiac catheterization  X 2 then 03/03/2012     NL LVF, normal coronaries, vessels are small (HPR: Dr. Beatrix Fetters)  . Hernia repair      UHR  . Umbilical hernia repair    . Video assisted thoracoscopy Left 01/24/2014    Procedure: VIDEO ASSISTED THORACOSCOPY;  Surgeon: Gaye Pollack, MD;  Location: [facility] OR;  Service: Thoracic;  Laterality: Left;  VATS/open lung biopsy  . Pericardial window Left 01/24/2014    Procedure: PERICARDIAL WINDOW;  Surgeon: Gaye Pollack, MD;  Location: [facility];  Service: Thoracic;  Laterality: Left;  . Video bronchoscopy N/A 01/24/2014    Procedure: VIDEO BRONCHOSCOPY;  Surgeon: Gaye Pollack, MD;  Location: [facility] OR;  Service: Thoracic;  Laterality: N/A;   HPI:  63 year old male with a history of atrial fibrillation who was taken off xarelto because of recurrent hemoptysis and recent window procedure for pericardial effusion who presents with confusion do to multifocal left MCA territory infarct. CT reads  Patchy hypoattenuation of the left posterior temporal, parietal, and occipital cortex and adjacent white matter. CXR shows infection at right lung base, left sided pna. Pt has a history of lap band.    Assessment / Plan / Recommendation Clinical Impression  Pt demonstrates a mild expressive aphasia, particularly with word finding. Pt communicates at conversation level with intermittent semantic paraphasias and neologisms. Pt is aware and often independnetly uses circumlocution and gestures to effectively communicate wants and needs. Receptive aphasia also observed with complex commands, pt compensates with repetition and simplification of language  structures. There is a right visual field negect/cut further impairing reading and participation in functional tasks. Pt will need SLP f/u, location depending on physical needs and family supervision. Outpatient would be adequate but pt indicates his wife was not able to provide 24 hour supervision upon last d/c. CIR consult recommended if PT agrees.     SLP Assessment  Patient needs continued Speech Lanaguage Pathology Services    Follow Up Recommendations  Outpatient SLP;24 hour supervision/assistance;Other (comment) (CIR if pt has physical needs)    Frequency and Duration min 3x week  2 weeks   Pertinent Vitals/Pain NA   SLP Goals  SLP Goals Potential to Achieve Goals: Good Potential Considerations: Family/community support  SLP Evaluation Prior Functioning  Cognitive/Linguistic Baseline: Within functional limits  Lives With: Spouse Available Help at Discharge: Family;Available PRN/intermittently;Other (Comment) (pt says wife works 3 days a week. ? if stepdtr available. )   Cognition  Overall Cognitive Status: Impaired/Different from baseline (due to language impairment. visual deficits) Arousal/Alertness: Awake/alert Orientation Level: Oriented to place;Oriented to situation;Oriented to person (cannot state time due to aphasia) Attention: Focused;Sustained Focused Attention: Appears intact Sustained Attention: Appears intact Memory:  (verbalizes memory of recent events) Awareness: Appears intact Problem Solving: Appears intact    Comprehension  Auditory Comprehension Overall Auditory Comprehension: Impaired Yes/No Questions: Within Functional Limits Commands: Impaired One Step Basic Commands: 75-100% accurate Multistep Basic Commands: 50-74% accurate Complex Commands: 25-49% accurate Conversation: Simple Interfering Components: Anxiety;Visual impairments Visual Recognition/Discrimination Discrimination: Exceptions to [facility] Reading Comprehension Reading Status:  (able  to identiy, but right field cut/neglect)    Expression Expression Primary Mode of Expression: Verbal Verbal Expression Overall Verbal Expression: Impaired Initiation: No impairment Automatic Speech: Name;Social Response Level of Generative/Spontaneous Verbalization: Conversation Repetition: No impairment Naming: Impairment Responsive: 51-75% accurate Confrontation: Impaired Divergent: Not tested Verbal Errors: Semantic paraphasias;Neologisms;Aware of errors Pragmatics: No impairment Effective Techniques: Semantic cues;Phonemic cues;Sentence completion Non-Verbal Means of Communication: Gestures   Oral / Motor Oral Motor/Sensory Function Overall Oral Motor/Sensory Function: Impaired Labial ROM: Within Functional Limits Labial Symmetry: Abnormal symmetry right Labial Strength: Within Functional Limits Lingual ROM: Within Functional Limits Lingual Symmetry: Within Functional Limits Lingual Strength: Within Functional Limits Lingual Sensation: Within Functional Limits Facial ROM: Within Functional Limits Facial Symmetry: Within Functional Limits Facial Strength: Within Functional Limits Facial Sensation: Within Functional Limits Velum: Within Functional Limits Mandible: Within Functional Limits Motor Speech Overall Motor Speech: Appears within functional limits for tasks assessed   GO    [facility], MA CCC-SLP [id number]  Katherene Ponto Jon [name] 01/31/2014, 9:37 AM

## 2014-01-31 NOTE — Progress Notes (Signed)
PT Cancellation Note  Patient Details Name: Jon [name] MRN: [id number] DOB: 04-23-1951   Cancelled Treatment:    Reason Eval/Treat Not Completed: Patient at procedure or test/unavailable   [name] 01/31/2014, 12:35 PM

## 2014-02-01 ENCOUNTER — Telehealth: Payer: Self-pay | Admitting: Critical Care Medicine

## 2014-02-01 DIAGNOSIS — I6350 Cerebral infarction due to unspecified occlusion or stenosis of unspecified cerebral artery: Secondary | ICD-10-CM | POA: Diagnosis not present

## 2014-02-01 LAB — CBC WITH DIFFERENTIAL/PLATELET
Basophils Absolute: 0 10*3/uL (ref 0.0–0.1)
Basophils Relative: 0 % (ref 0–1)
Eosinophils Absolute: 0 10*3/uL (ref 0.0–0.7)
Eosinophils Relative: 0 % (ref 0–5)
HCT: 37.3 % — ABNORMAL LOW (ref 39.0–52.0)
Hemoglobin: 12.7 g/dL — ABNORMAL LOW (ref 13.0–17.0)
LYMPHS PCT: 14 % (ref 12–46)
Lymphs Abs: 1.3 10*3/uL (ref 0.7–4.0)
MCH: 27.9 pg (ref 26.0–34.0)
MCHC: 34 g/dL (ref 30.0–36.0)
MCV: 82 fL (ref 78.0–100.0)
Monocytes Absolute: 0.8 10*3/uL (ref 0.1–1.0)
Monocytes Relative: 9 % (ref 3–12)
NEUTROS ABS: 7.1 10*3/uL (ref 1.7–7.7)
Neutrophils Relative %: 77 % (ref 43–77)
PLATELETS: 250 10*3/uL (ref 150–400)
RBC: 4.55 MIL/uL (ref 4.22–5.81)
RDW: 15.5 % (ref 11.5–15.5)
WBC: 9.2 10*3/uL (ref 4.0–10.5)

## 2014-02-01 LAB — COMPREHENSIVE METABOLIC PANEL
ALK PHOS: 87 U/L (ref 39–117)
ALT: 29 U/L (ref 0–53)
AST: 17 U/L (ref 0–37)
Albumin: 2.5 g/dL — ABNORMAL LOW (ref 3.5–5.2)
BILIRUBIN TOTAL: 0.8 mg/dL (ref 0.3–1.2)
BUN: 12 mg/dL (ref 6–23)
CHLORIDE: 103 meq/L (ref 96–112)
CO2: 27 meq/L (ref 19–32)
Calcium: 9 mg/dL (ref 8.4–10.5)
Creatinine, Ser: 1.11 mg/dL (ref 0.50–1.35)
GFR, EST AFRICAN AMERICAN: 79 mL/min — AB (ref >90)
GFR, EST NON AFRICAN AMERICAN: 68 mL/min — AB (ref >90)
Glucose, Bld: 197 mg/dL — ABNORMAL HIGH (ref 70–99)
POTASSIUM: 4.2 meq/L (ref 3.7–5.3)
Sodium: 144 mEq/L (ref 137–147)
Total Protein: 5.9 g/dL — ABNORMAL LOW (ref 6.0–8.3)

## 2014-02-01 LAB — GLUCOSE, CAPILLARY
GLUCOSE-CAPILLARY: 150 mg/dL — AB (ref 70–99)
GLUCOSE-CAPILLARY: 235 mg/dL — AB (ref 70–99)

## 2014-02-01 LAB — MAGNESIUM: MAGNESIUM: 2.1 mg/dL (ref 1.5–2.5)

## 2014-02-01 LAB — TSH: TSH: 0.736 u[IU]/mL (ref 0.350–4.500)

## 2014-02-01 LAB — T4, FREE: Free T4: 1.33 ng/dL (ref 0.80–1.80)

## 2014-02-01 MED ORDER — RIVAROXABAN 20 MG PO TABS: 20.0000 mg | ORAL_TABLET | Freq: Every day | ORAL | Status: DC

## 2014-02-01 NOTE — Progress Notes (Signed)
Stroke Team Progress Note  HISTORY Jon [name] is a 63 y.o. male with a history of recent admission for pericardial effusion who underwent a window procedure as well as hemoptysis that has been recurrent. He normally takes xarelto but was taken off of this due to the hemoptysis and need for procedure. He was discharged recently been doing well at home went to bed in his normal state of health on 5/31 until bedtime but awoke on 6/1 with confusion. He remained confused throughout the day and therefore finally sought care in the emergency department.  Patient was not administered TPA secondary to delay in arrival. He was admitted for further evaluation and treatment.  SUBJECTIVE Patient up in the chair at the bedside.  OBJECTIVE Most recent Vital Signs: Filed Vitals:   01/31/14 2333 02/01/14 0400 02/01/14 0533 02/01/14 0743  BP: 122/77 138/75 138/75 132/71  Pulse: 85 25 63 58  Temp: 98.7 F (37.1 C)  98 F (36.7 C) 98.5 F (36.9 C)  TempSrc: Oral  Oral Oral  Resp:  25 19 20   Height:      Weight:      SpO2:  94% 96% 97%   CBG (last 3)   Recent Labs  01/31/14 1710 01/31/14 2003 01/31/14 2145  GLUCAP 213* 204* 192*    IV Fluid Intake:      MEDICATIONS  .  stroke: mapping our early stages of recovery book   Does not apply Once  . antiseptic oral rinse  15 mL Mouth Rinse BID  . aspirin  300 mg Rectal Once  . aspirin  300 mg Rectal Daily   Or  . aspirin  325 mg Oral Daily  . atorvastatin  40 mg Oral Daily  . diltiazem  10 mg Intravenous 3 times per day  . fluticasone  1 spray Each Nare Daily  . insulin aspart  0-15 Units Subcutaneous TID WC  . insulin aspart  0-5 Units Subcutaneous QHS  . insulin detemir  20 Units Subcutaneous QHS  . isosorbide mononitrate  60 mg Oral q morning - 10a  . latanoprost  1 drop Both Eyes QHS  . lisinopril  40 mg Oral BID  . losartan  50 mg Oral Daily  . magnesium oxide  1,600 mg Oral Daily  . metolazone  2.5 mg Oral Q M,W,F  .  mometasone-formoterol  2 puff Inhalation BID  . pantoprazole  40 mg Oral BID  . potassium chloride SA  20 mEq Oral TID  . pramipexole  1.5 mg Oral QHS  . pregabalin  75 mg Oral BID  . torsemide  20 mg Oral BID  . zolpidem  5 mg Oral QHS   PRN:  hydrALAZINE, ipratropium-albuterol, nitroGLYCERIN, oxyCODONE-acetaminophen, polyvinyl alcohol, senna-docusate  Diet:  Carb Control thin liquids Activity:   DVT Prophylaxis:  SCDs   CLINICALLY SIGNIFICANT STUDIES Basic Metabolic Panel:   Recent Labs Lab 01/30/14 1947 01/30/14 1955 02/01/14 0215  NA 142 146 144  K 4.1 3.9 4.2  CL 105 103 103  CO2 25  --  27  GLUCOSE 184* 180* 197*  BUN 11 9 12   CREATININE 0.96 1.00 1.11  CALCIUM 9.3  --  9.0  MG  --   --  2.1   Liver Function Tests:   Recent Labs Lab 01/30/14 1947 02/01/14 0215  AST 33 17  ALT 42 29  ALKPHOS 91 87  BILITOT 0.6 0.8  PROT 6.2 5.9*  ALBUMIN 2.6* 2.5*   CBC:   Recent  Labs Lab 01/30/14 1947 01/30/14 1955 02/01/14 0215  WBC 9.0  --  9.2  NEUTROABS 7.2  --  7.1  HGB 13.4 14.3 12.7*  HCT 39.2 42.0 37.3*  MCV 81.3  --  82.0  PLT 232  --  250   Coagulation:   Recent Labs Lab 01/30/14 1947  LABPROT 13.4  INR 1.04   Cardiac Enzymes: No results found for this basename: CKTOTAL, CKMB, CKMBINDEX, TROPONINI,  in the last 168 hours Urinalysis:   Recent Labs Lab 01/26/14 1130 01/30/14 2134  COLORURINE YELLOW AMBER*  LABSPEC 1.021 1.019  PHURINE 5.0 8.0  GLUCOSEU NEGATIVE 250*  HGBUR NEGATIVE NEGATIVE  BILIRUBINUR SMALL* NEGATIVE  KETONESUR NEGATIVE NEGATIVE  PROTEINUR NEGATIVE NEGATIVE  UROBILINOGEN 0.2 0.2  NITRITE NEGATIVE NEGATIVE  LEUKOCYTESUR TRACE* NEGATIVE   Lipid Panel    Component Value Date/Time   CHOL 91 01/31/2014 0337   TRIG 69 01/31/2014 0337   HDL 31* 01/31/2014 0337   CHOLHDL 2.9 01/31/2014 0337   VLDL 14 01/31/2014 0337   LDLCALC 46 01/31/2014 0337   HgbA1C  Lab Results  Component Value Date   HGBA1C 8.1* 01/31/2014    Urine  Drug Screen:     Component Value Date/Time   LABOPIA POSITIVE* 01/30/2014 2134   COCAINSCRNUR NONE DETECTED 01/30/2014 2134   LABBENZ NONE DETECTED 01/30/2014 2134   AMPHETMU NONE DETECTED 01/30/2014 2134   THCU NONE DETECTED 01/30/2014 2134   LABBARB NONE DETECTED 01/30/2014 2134    Alcohol Level:   Recent Labs Lab 01/30/14 1947  ETH <11     CT of the brain  01/30/2014    Multifocal areas of acute infarction are suspected in the left hemisphere, consistent with a left MCA territory insult. Proximal vascular thrombosis versus a shower of emboli could be associated with these widely separated areas of cortical and subcortical cytotoxic edema. No mass effect or hemorrhage at this time.  MRI of the brain  01/31/2014   Confluent left MCA territory infarction affecting the posterior temporal, parietal, and occipital cortex. Tiny subcortical white matter acute infarct right parietal lobe. No left frontal infarction.    MRA of the brain  01/31/2014   Unremarkable MRA.  No proximal stenosis or occlusion.     2D Echocardiogram  EF 55-60% with no source of embolus.   Carotid Doppler  No evidence of hemodynamically significant internal carotid artery stenosis. Vertebral artery flow is antegrade.   CXR  01/30/2014   Worsening left-sided pneumonia and parapneumonic effusion.    EKG  atrial fibrillation, rate 87. For complete results please see formal report.   Therapy Recommendations HH PT, OT   Physical Exam   Pleasant middle-age Caucasian male not in distress.Awake alert. Afebrile. Head is nontraumatic. Neck is supple without bruit. Hearing is normal. Cardiac exam no murmur or gallop. Lungs are clear to auscultation. Distal pulses are well felt. Neurological Exam : Awake alert oriented x3. Nonfluent speech with word finding difficulties. No paraphasias. Good naming comprehension. Vision acuity seems adequate. Partial right homonymous hemianopsia. Face is symmetric. Tongue midline. Motor system exam no upper or  lower extremity drift. Symmetric and equal strength in all 4 extremity is. No focal weakness. Gait was not tested. ASSESSMENT Mr. Jon [name] is a 63 y.o. male presenting with new onset confusion. Imaging confirms patchy left MCA infarcts and a tiny right parietal infarct. Infarcts felt to be embolic secondary to known atrial fibrillation.  Typically on xarelto, however had not been taking due to recent surgery  and hemoptysis; on no antithrombotics prior to admission. Now on aspirin 325 mg orally every day for secondary stroke prevention. Patient with resultant expressive and receptive aphaisa, dense right HH. Stroke work up underway.  Recent pericardial effusion with surgery. Stable since discharge.  Recurrent hemoptysis with pulmonary infiltrates. Autoimmune workup neg thus far. Think it may have been simply PNA with hemoptysis on xarelto. Will f/u bx results. No need for further intervention. atrial fibrillation. Taken off anticoagulation due to recurrent hemoptysis hypertension Hyperlipidemia, LDL 46, on lipitor 40 mg PTA, now on lipitor 40 mg daily, goal LDL < 100 (< 70 for diabetics) Diabetes, HgbA1c 8.1, goal < 7.0. Off insulin pump in the hospital. On levimir. obstructive sleep apnea, on CPAP at home Obesity, Body mass index is 35.1 kg/(m^2).   Hx MI  Fibromyalgia  COPD  CCM ok to restart anticoagulation if ok with thoracic surgery [facility])  Hospital day # 2  TREATMENT/PLAN  Continue aspirin 325 mg orally every day for secondary stroke prevention for now. Recommend change to eliquis for secondary stroke prevention once stable for anticoagulants from thoracic surgeon standpoint. HH PT and OT, tub/shower seat Dr. Leonie Man will discuss with Dr. Thereasa Solo. [place] for discharge from stroke standpoint Follow up with Dr. Leonie Man in 2 months  SIGNED Burnetta Sabin, MSN, RN, ANVP-BC, ANP-BC, GNP-BC [facility] Pager: [phone] 02/01/2014 8:33 AM   I have personally obtained a  history, examined the patient, evaluated imaging results, and formulated the assessment and plan of care. I agree with the above.    To contact Stroke Continuity provider, please refer to [email or web address] After hours, contact General Neurology

## 2014-02-01 NOTE — Discharge Instructions (Signed)
Ischemic Stroke A stroke (cerebrovascular accident) is the sudden death of brain tissue. It is a medical emergency. A stroke can cause permanent loss of brain function. This can cause problems with different parts of your body. A transient ischemic attack (TIA) is different because it does not cause permanent damage. A TIA is a short-lived problem of poor blood flow affecting a part of the brain. A TIA is also a serious problem because having a TIA greatly increases the chances of having a stroke. When symptoms first develop, you cannot know if the problem might be a stroke or TIA. CAUSES  A stroke is caused by a decrease of oxygen supply to an area of your brain. It is usually the result of a small blood clot or collection of cholesterol or fat (plaque) that blocks blood flow in the brain. A stroke can also be caused by blocked or damaged carotid arteries.  RISK FACTORS  High blood pressure (hypertension).  High cholesterol.  Diabetes mellitus.  Heart disease.  The build up of plaque in the blood vessels (peripheral artery disease or atherosclerosis).  The build up of plaque in the blood vessels providing blood and oxygen to the brain (carotid artery stenosis).  An abnormal heart rhythm (atrial fibrillation).  Obesity.  Smoking.  Taking oral contraceptives (especially in combination with smoking).  Physical inactivity.  A diet high in fats, salt (sodium), and calories.  Alcohol use.  Use of illegal drugs (especially cocaine and methamphetamine).  Being African American.  Being over the age of 55.  Family history of stroke.  Previous history of blood clots, stroke, TIA, or heart attack.  Sickle cell disease. SYMPTOMS  These symptoms usually develop suddenly, or may be newly present upon awakening from sleep:  Sudden weakness or numbness of the face, arm, or leg, especially on one side of the body.  Sudden trouble walking or difficulty moving arms or legs.  Sudden  confusion.  Sudden personality changes.  Trouble speaking (aphasia) or understanding.  Difficulty swallowing.  Sudden trouble seeing in one or both eyes.  Double vision.  Dizziness.  Loss of balance or coordination.  Sudden severe headache with no known cause.  Trouble reading or writing. DIAGNOSIS  Your caregiver can often determine the presence or absence of a stroke based on your symptoms, history, and physical exam. Computed tomography (CT) of the brain is usually performed to confirm the stroke, determine causes, and determine stroke severity. Other tests may be done to find the cause of the stroke. These tests may include:  Electrocardiography.  Continuous heart monitoring.  Echocardiography.  Carotid ultrasonography.  Magnetic resonance imaging (MRI).  A scan of the brain circulation.  Blood tests. PREVENTION  The risk of a stroke can be decreased by appropriately treating high blood pressure, high cholesterol, diabetes, heart disease, and obesity and by quitting smoking, limiting alcohol, and staying physically active. TREATMENT  Time is of the essence. It is important to seek treatment within 3 4 hours of the start of symptoms because you may receive a medicine to dissolve the clot (thrombolytic) that cannot be given after that time. Even if you do not know when your symptoms began, get treatment as soon as possible. After the 4 hour window has passed, treatment may include rest, oxygen, intravenous (IV) fluids, and medicines to thin the blood (anticoagulants). Treatment of stroke depends on the duration, severity, and cause of your symptoms. Medicines and diet may be used to address diabetes, high blood pressure, and other risk   factors. Physical, speech, and occupational therapists will assess you and work to improve any functions impaired by the stroke. Measures will be taken to prevent short-term and long-term complications, including infection from breathing  foreign material into the lungs (aspiration pneumonia), blood clots in the legs, bedsores, and falls. Rarely, surgery may be needed to remove large blood clots or to open up blocked arteries. HOME CARE INSTRUCTIONS   Take all medicines prescribed by your caregiver. Follow the directions carefully. Medicines may be used to control risk factors for a stroke. Be sure you understand all your medicine instructions.  You may be told to take aspirin or the anticoagulant warfarin. Warfarin needs to be taken exactly as instructed.  Too much and too little warfarin are both dangerous. Too much warfarin increases the risk of bleeding. Too little warfarin continues to allow the risk for blood clots. While taking warfarin, you will need to have regular blood tests to measure your blood clotting time. These blood tests usually include both the PT and INR tests. The PT and INR results allow your caregiver to adjust your dose of warfarin. The dose can change for many reasons. It is critically important that you take warfarin exactly as prescribed, and that you have your PT and INR levels drawn exactly as directed.  Many foods, especially foods high in vitamin K can interfere with warfarin and affect the PT and INR results. Foods high in vitamin K include spinach, kale, broccoli, cabbage, collard and turnip greens, brussels sprouts, peas, cauliflower, seaweed, and parsley as well as beef and pork liver, green tea, and soybean oil. You should eat a consistent amount of foods high in vitamin K. Avoid major changes in your diet, or notify your caregiver before changing your diet. Arrange a visit with a dietitian to answer your questions.  Many medicines can interfere with warfarin and affect the PT and INR results. You must tell your caregiver about any and all medicines you take, this includes all vitamins and supplements. Be especially cautious with aspirin and anti-inflammatory medicines. Do not take or discontinue any  prescribed or over-the-counter medicine except on the advice of your caregiver or pharmacist.  Warfarin can have side effects, such as excessive bruising or bleeding. You will need to hold pressure over cuts for longer than usual. Your caregiver or pharmacist will discuss other potential side effects.  Avoid sports or activities that may cause injury or bleeding.  Be mindful when shaving, flossing your teeth, or handling sharp objects.  Alcohol can change the body's ability to handle warfarin. It is best to avoid alcoholic drinks or consume only very small amounts while taking warfarin. Notify your caregiver if you change your alcohol intake.  Notify your dentist or other caregivers before procedures.  If swallow studies have determined that your swallowing reflex is present, you should eat healthy foods. A diet that includes 5 or more servings of fruits and vegetables a day may reduce the risk of stroke. Foods may need to be a special consistency (soft or pureed), or small bites may need to be taken in order to avoid aspirating or choking. Certain diets may be prescribed to address high blood pressure, high cholesterol, diabetes, or obesity.  A low-sodium, low-saturated fat, low-trans fat, low-cholesterol diet is recommended to manage high blood pressure.  A low-saturated fat, low-trans fat, low-cholesterol, and high-fiber diet may control cholesterol levels.  A controlled-carbohydrate, controlled-sugar diet is recommended to manage diabetes.  A reduced-calorie, low-sodium, low-saturated fat, low-trans fat, low-cholesterol diet   is recommended to manage obesity.  Maintain a healthy weight.  Stay physically active. It is recommended that you get at least 30 minutes of activity on most or all days.  Do not smoke.  Limit alcohol use even if you are not taking warfarin. Moderate alcohol use is considered to be:  No more than 2 drinks each day for men.  No more than 1 drink each day for  nonpregnant women.  Stop drug abuse.  Home safety. A safe home environment is important to reduce the risk of falls. Your caregiver may arrange for specialists to evaluate your home. Having grab bars in the bedroom and bathroom is often important. Your caregiver may arrange for equipment to be used at home, such as raised toilets and a seat for the shower.  Physical, occupational, and speech therapy. Ongoing therapy may be needed to maximize your recovery after a stroke. If you have been advised to use a walker or a cane, use it at all times. Be sure to keep your therapy appointments.  Follow all instructions for follow-up with your caregiver. This is very important. This includes any referrals, physical therapy, rehabilitation, and lab tests. Proper follow up can prevent another stroke from occurring. SEEK MEDICAL CARE IF:  You have personality changes.  You have difficulty swallowing.  You are seeing double.  You have dizziness.  You have a fever.  You have skin breakdown. SEEK IMMEDIATE MEDICAL CARE IF:  Any of these symptoms may represent a serious problem that is an emergency. Do not wait to see if the symptoms will go away. Get medical help right away. Call your local emergency services (911 in [place] Do not drive yourself to the hospital.  You have sudden weakness or numbness of the face, arm, or leg, especially on one side of the body.  You have sudden trouble walking or difficulty moving arms or legs.  You have sudden confusion.  You have trouble speaking (aphasia) or understanding.  You have sudden trouble seeing in one or both eyes.  You have a loss of balance or coordination.  You have a sudden, severe headache with no known cause.  You have new chest pain or an irregular heartbeat.  You have a partial or total loss of consciousness.   Document Released: 08/18/2005 Document Revised: 04/20/2013 Document Reviewed: 03/28/2012 [employer] Patient Information 2014  [employer]  

## 2014-02-01 NOTE — Progress Notes (Signed)
Speech Language Pathology Treatment: Cognitive-Linquistic;Dysphagia  Patient Details Name: Jon [name] MRN: [id number] DOB: [birth year] Today's Date: 02/01/2014 Time: 1030-1055 SLP Time Calculation (min): 25 min  Assessment / Plan / Recommendation Clinical Impression  Pts word finding spontaneously improving. Pt required min verbal cues x3 over 20 minute conversation. Pt independently uses gestures and description to compensate. 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 Pt need max verbal cues to correct. Also instructed pt in left to right scanning during functional tasks, anchoring with finger when searching a page. Pt will receive home health PT/OT, home health SLP also recommended though pt may need further f/u with Outpatient. Pt is tolerating diet, no evidence of aspiration. Will sign off for dysphagia and follow for aphasia only.    HPI HPI: 63 year old male with a history of atrial fibrillation who was taken off xarelto because of recurrent hemoptysis and recent window procedure for pericardial effusion who presents with confusion do to multifocal left MCA territory infarct. CT reads Patchy hypoattenuation of the left posterior temporal, parietal, and occipital cortex and adjacent white matter. CXR shows infection at right lung base, left sided pna. Pt has a history of lap band.    Pertinent Vitals NA  SLP Plan  Continue with current plan of care    Recommendations Diet recommendations: Regular;Thin liquid Liquids provided via: Cup;Straw Medication Administration: Whole meds with liquid Supervision: Intermittent supervision to cue for compensatory strategies Compensations: Slow rate;Small sips/bites Postural Changes and/or Swallow Maneuvers: Seated upright 90 degrees              Oral Care Recommendations: Oral care BID Follow up Recommendations: 24 hour supervision/assistance;Home health SLP Plan: Continue with current plan  of care    GO    [facility], MA CCC-SLP [place] Lynel Forester 02/01/2014, 11:05 AM

## 2014-02-01 NOTE — Progress Notes (Signed)
Pt discharged home per MD order. All discharge instructions reviewed and all questions answered.

## 2014-02-01 NOTE — Telephone Encounter (Signed)
Called the pt to give him bx results from lung Pt had to go back for CVA over weekend. D/c summary noted Pt can resume Xarelto and I told pt to do so He is to keep his ov with [place] in [employer]

## 2014-02-01 NOTE — Progress Notes (Signed)
Occupational Therapy Treatment Patient Details Name: Jon [name] MRN: [id number] DOB: 1950-11-03 Today's Date: 02/01/2014    History of present illness 63 y.o. male with a history of recent admission for pericardial effusion who underwent a window procedure as well as hemoptysis that has been recurrent. He normally takes xarelto but was taken off of this due to the hemoptysis and need for procedure. He was discharged recently been doing well at home went to bed in his normal state of health on 5/31 but awoke on 6/1 with confusion. Multifocal areas of acute infarction are suspected in the left. MRI pending.   OT comments  Pt preparing for discharge.  Discussed visual deficits and compensatory and safety strategies.  Pt verbalized understanding  Follow Up Recommendations  Home health OT;Supervision/Assistance - 24 hour    Equipment Recommendations  Tub/shower seat    Recommendations for Other Services      Precautions / Restrictions Precautions Precautions: Fall       Mobility Bed Mobility                  Transfers                      Balance                                   ADL                                                Vision                 Additional Comments: Pt preparing to discharge home.   He is able to state his deficits and need to scan environment and use anchors.   Discussed safety issues with pt and wife including no driving; transition into community environments slowly and have wife walk on his Rt side.   Discussed stopping and scanning new environments before entering.  Recommended to pt that he see his ophthalmologist and have a visual field test ([place]ph[place] 120) performed.  Written information provided to pt.  All questions answered.  Pt deferred other activity due to being eager to discharge.    Perception     Praxis      Cognition   Behavior During Therapy: WFL for tasks  assessed/performed Overall Cognitive Status: Within Functional Limits for tasks assessed                       Extremity/Trunk Assessment               Exercises     Shoulder Instructions       General Comments      Pertinent Vitals/ Pain       Denies pain  Home Living                                          Prior Functioning/Environment              Frequency Min 2X/week     Progress Toward Goals  OT Goals(current goals can now be found in the care plan section)  Progress towards OT goals: Progressing  toward goals  ADL Goals Pt Will Perform Lower Body Dressing: with caregiver independent in assisting;with set-up;with supervision;sit to/from stand Pt Will Transfer to Toilet: with supervision;ambulating Pt Will Perform Toileting - Clothing Manipulation and hygiene: with supervision;sit to/from stand Additional ADL Goal #1: Pt will identify 5/5 targets in R visual field using compensatory strategies  Plan Discharge plan remains appropriate    Co-evaluation                 End of Session     Activity Tolerance Patient tolerated treatment well   Patient Left in chair;with call bell/phone within reach;with family/visitor present   Nurse Communication          Time:  -     Charges: OT General Charges $OT Visit: 1 Procedure OT Treatments $Self Care/Home Management : 8-22 mins  Meshelle Holness M Lysette Lindenbaum 02/01/2014, 2:12 PM

## 2014-02-01 NOTE — Discharge Summary (Signed)
DISCHARGE SUMMARY  Jon [name]  MR#: [id number]  DOB:Sep 19, 1950  Date of Admission: 01/30/2014 Date of Discharge: 02/01/2014  Attending Physician:Jeffrey T McClung  Patient's PCP:COX,KIRSTEN, MD  Consults: Neurology  Pulmonary  Disposition: DC home  Follow-up Appts:     Follow-up Information   Follow up with Forbes Cellar, MD. Schedule an appointment as soon as possible for a visit in 2 months. (Stroke Clinic)    Specialties:  Neurology, Radiology   Contact information:   [address] [place] [address] [phone]       Follow up with [facility]. [facility]- PT/OT arranged)    Specialty:  [place] information:   [address] [place]  [address] [phone]       Follow up with Rochel Brome, MD. Schedule an appointment as soon as possible for a visit in 3 days.   Specialty:  Family Medicine   Contact information:   [place] [address] [phone]      Tests Needing Follow-up: - pt should be assessed for any signs of bleeding as Xarelto is being resumed at time of d/c   Discharge Diagnoses: CVA - patchy left MCA infarcts and a tiny right parietal infarct HTN AFib HLD  Hypothyroidism  Pericardial effusion/hemoptysis:  COPD OSA  IDDM  Initial presentation: 63 yo M with multiple medical problems including insulin-dependent diabetes on insulin pump, hx/o CVA, diabetic neuropathy COPD, afib previously on xarelto, hypertension, hyperlipidemia, and morbid obesity status post gastric band who presented with a stroke. Patient had been admitted May 20th-29th for hemoptysis, bilateral pulmonary infiltrates, pericardial effusion, and AKI. He had a pericardial window as well as video bronchoscopy and VATS procedure. Pt's anticoagulation was d/c'd secondary to recurrent hemoptysis.  Pt stated that he was discharged at his baseline from that hospital stay. Wife stated that pt woke up the  morning of his admit (6/1) confused. Per wife, pt asked for something to eat and all he did was stare at the plate. EMS was called.   Pt presented to ER hemodynamically stable. In rate controlled afib. Afebrile. Blood work essentially WNL. Had head CT that showed multifical areas of acute infarct in L hemisphere consistent with L MCA territory insult. No mass effect or hemorrhage. Neuro consulted with recommendation for stroke workup and medical admission.   Hospital Course:  SIGNIFICANT EVENTS:  5/20 Admit  5/26 Lt VATS lung bx, pericardial window 5/28 Hypotensive, AKI, Lt chest tube d/c'ed  5/29: discharged  6/1: re-admitted w/ left MCA CVA   CVA - patchy left MCA infarcts and a tiny right parietal infarct -Positive acute CVA by CT  -Stroke Team on board and cleared for d/c home  -PCCM felt comfortable w/ resumption of Xarelto - spoke w/ Dr. Cyndia Bent who stated that from standpoint of recent surgery alone pt could resume Xarelto now, but advised to monitor closely for recurrence of hemoptysis -discussed risk/benefit at length w/ pt and his wife - pt is fearful of recurrent stroke and favors Xarelto tx - I feel this is reasonable/appropriate, and have instructed him to resume Xarelto w/ instuctions to seek med help immediately should he re-develop hemoptysis, or any other signs of bleeding  -PT/OT eval suggested HH w[facility] was arranged   HTN - AFib  - Rate controlled  -Continue Cardizem  -Continue torsemide 20 mg BID  -Zaroxolyn 2.5 mg M/W/F  -Losartan 50 mg daily  -Lisinopril 40 mg BID  -Imdur 60 mg daily  -  Hydralazine IV 10 mg q 4hr PRN SBP > 170   HLD  -Continue Lipitor 40 mg daily  -Lipid panel within AHA guidelines   Hypothyroidism  -Currently not on medication - TSH normal   Pericardial effusion/hemoptysis -Clinically stable since hospital discharge. No acute changes on CXR. No reports of hemoptysis.   COPD -well compensated during hospital stay   OSA  -CPAP per  respiratory   IDDM -Insulin pump continued w/o change     Medication List         albuterol 108 (90 BASE) MCG/ACT inhaler  Commonly known as:  PROVENTIL HFA;VENTOLIN HFA  Inhale 2 puffs into the lungs every 6 (six) hours as needed for wheezing or shortness of breath.     atorvastatin 40 MG tablet  Commonly known as:  LIPITOR  Take 40 mg by mouth daily.     azelastine 0.1 % nasal spray  Commonly known as:  ASTELIN  Place 1 spray into both nostrils at bedtime as needed.     CALTRATE 600+D PO  Take 2 tablets by mouth 2 (two) times daily.     CENTRUM SILVER PO  Take 1 tablet by mouth daily.     diltiazem 30 MG tablet  Commonly known as:  CARDIZEM  Take 30 mg by mouth 2 (two) times daily.     fluticasone 50 MCG/ACT nasal spray  Commonly known as:  FLONASE  Place 1 spray into both nostrils daily.     Fluticasone-Salmeterol 500-50 MCG/DOSE Aepb  Commonly known as:  ADVAIR  Inhale 1 puff into the lungs every 12 (twelve) hours.     hydrALAZINE 50 MG tablet  Commonly known as:  APRESOLINE  Take 50 mg by mouth 4 (four) times daily.     insulin pump Soln  Inject into the skin continuous. Inject 0.5 units of novolog every hour per insulin pump.     ipratropium-albuterol 0.5-2.5 (3) MG/3ML Soln  Commonly known as:  DUONEB  Take 3 mLs by nebulization every 6 (six) hours as needed. For shortness of breath     isosorbide mononitrate 60 MG 24 hr tablet  Commonly known as:  IMDUR  Take 60 mg by mouth every morning.     latanoprost 0.005 % ophthalmic solution  Commonly known as:  XALATAN  Place 1 drop into both eyes at bedtime.     lisinopril 40 MG tablet  Commonly known as:  PRINIVIL,ZESTRIL  Take 40 mg by mouth 2 (two) times daily.     losartan 50 MG tablet  Commonly known as:  COZAAR  Take 50 mg by mouth daily.     magnesium oxide 400 MG tablet  Commonly known as:  MAG-OX  Take 1,600 mg by mouth daily.     metolazone 2.5 MG tablet  Commonly known as:  ZAROXOLYN    Take 2.5 mg by mouth See admin instructions. Patient takes only on Monday Wednesday Friday. Take 30 minutes before furosemide     nitroGLYCERIN 0.4 MG SL tablet  Commonly known as:  NITROSTAT  Place 0.4 mg under the tongue every 5 (five) minutes as needed. For chest pain     NOVOLOG 100 UNIT/ML injection  Generic drug:  insulin aspart  As directed via insulin pump     oxyCODONE-acetaminophen 5-325 MG per tablet  Commonly known as:  PERCOCET/ROXICET  Take 1 tablet by mouth 4 (four) times daily as needed for severe pain. For severe pain     pantoprazole 40 MG tablet  Commonly  known as:  PROTONIX  Take 40 mg by mouth 2 (two) times daily.     potassium chloride SA 20 MEQ tablet  Commonly known as:  K-DUR,KLOR-CON  Take 20 mEq by mouth 3 (three) times daily.     pramipexole 1.5 MG tablet  Commonly known as:  MIRAPEX  Take 1.5 mg by mouth at bedtime.     pregabalin 75 MG capsule  Commonly known as:  LYRICA  Take 1 capsule (75 mg total) by mouth 2 (two) times daily.     rivaroxaban 20 MG Tabs tablet  Commonly known as:  XARELTO  Take 1 tablet (20 mg total) by mouth daily with supper.     SYSTANE PRESERVATIVE FREE 0.4-0.3 % Soln  Generic drug:  Polyethyl Glycol-Propyl Glycol  Apply 1 drop to eye 4 (four) times daily as needed (dryness of eye).     torsemide 20 MG tablet  Commonly known as:  DEMADEX  Take 20 mg by mouth 2 (two) times daily.     triamcinolone 55 MCG/ACT nasal inhaler  Commonly known as:  NASACORT  Place 2 sprays into both nostrils daily.     zolpidem 10 MG tablet  Commonly known as:  AMBIEN  Take 10 mg by mouth at bedtime.        Day of Discharge BP 132/77  Pulse 72  Temp(Src) 97.9 F (36.6 C) (Oral)  Resp 27  Ht 5\' 8"  (1.727 m)  Wt 104.7 kg (230 lb 13.2 oz)  BMI 35.10 kg/m2  SpO2 94%  Physical Exam: General: No acute respiratory distress Lungs: Clear to auscultation bilaterally without wheezes or crackles Cardiovascular: Regular rate and  rhythm without murmur gallop or rub normal S1 and S2 Abdomen: Nontender, nondistended, soft, bowel sounds positive, no rebound, no ascites, no appreciable mass Extremities: No significant cyanosis, clubbing, or edema bilateral lower extremities  Results for orders placed during the hospital encounter of 01/30/14 (from the past 24 hour(s))  GLUCOSE, CAPILLARY     Status: Abnormal   Collection Time    01/31/14  5:10 PM      Result Value Ref Range   Glucose-Capillary 213 (*) 70 - 99 mg/dL  GLUCOSE, CAPILLARY     Status: Abnormal   Collection Time    01/31/14  8:03 PM      Result Value Ref Range   Glucose-Capillary 204 (*) 70 - 99 mg/dL  GLUCOSE, CAPILLARY     Status: Abnormal   Collection Time    01/31/14  9:45 PM      Result Value Ref Range   Glucose-Capillary 192 (*) 70 - 99 mg/dL  COMPREHENSIVE METABOLIC PANEL     Status: Abnormal   Collection Time    02/01/14  2:15 AM      Result Value Ref Range   Sodium 144  137 - 147 mEq/L   Potassium 4.2  3.7 - 5.3 mEq/L   Chloride 103  96 - 112 mEq/L   CO2 27  19 - 32 mEq/L   Glucose, Bld 197 (*) 70 - 99 mg/dL   BUN 12  6 - 23 mg/dL   Creatinine, Ser 1.11  0.50 - 1.35 mg/dL   Calcium 9.0  8.4 - 10.5 mg/dL   Total Protein 5.9 (*) 6.0 - 8.3 g/dL   Albumin 2.5 (*) 3.5 - 5.2 g/dL   AST 17  0 - 37 U/L   ALT 29  0 - 53 U/L   Alkaline Phosphatase 87  39 - 117 U/L  Total Bilirubin 0.8  0.3 - 1.2 mg/dL   GFR calc non Af Amer 68 (*) >90 mL/min   GFR calc Af Amer 79 (*) >90 mL/min  CBC WITH DIFFERENTIAL     Status: Abnormal   Collection Time    02/01/14  2:15 AM      Result Value Ref Range   WBC 9.2  4.0 - 10.5 K/uL   RBC 4.55  4.22 - 5.81 MIL/uL   Hemoglobin 12.7 (*) 13.0 - 17.0 g/dL   HCT 37.3 (*) 39.0 - 52.0 %   MCV 82.0  78.0 - 100.0 fL   MCH 27.9  26.0 - 34.0 pg   MCHC 34.0  30.0 - 36.0 g/dL   RDW 15.5  11.5 - 15.5 %   Platelets 250  150 - 400 K/uL   Neutrophils Relative % 77  43 - 77 %   Neutro Abs 7.1  1.7 - 7.7 K/uL    Lymphocytes Relative 14  12 - 46 %   Lymphs Abs 1.3  0.7 - 4.0 K/uL   Monocytes Relative 9  3 - 12 %   Monocytes Absolute 0.8  0.1 - 1.0 K/uL   Eosinophils Relative 0  0 - 5 %   Eosinophils Absolute 0.0  0.0 - 0.7 K/uL   Basophils Relative 0  0 - 1 %   Basophils Absolute 0.0  0.0 - 0.1 K/uL  MAGNESIUM     Status: None   Collection Time    02/01/14  2:15 AM      Result Value Ref Range   Magnesium 2.1  1.5 - 2.5 mg/dL  T4, FREE     Status: None   Collection Time    02/01/14  2:15 AM      Result Value Ref Range   Free T4 1.33  0.80 - 1.80 ng/dL  TSH     Status: None   Collection Time    02/01/14  2:15 AM      Result Value Ref Range   TSH 0.736  0.350 - 4.500 uIU/mL  GLUCOSE, CAPILLARY     Status: Abnormal   Collection Time    02/01/14  7:45 AM      Result Value Ref Range   Glucose-Capillary 150 (*) 70 - 99 mg/dL   Comment 1 Documented in Chart     Comment 2 Notify RN    GLUCOSE, CAPILLARY     Status: Abnormal   Collection Time    02/01/14 12:07 PM      Result Value Ref Range   Glucose-Capillary 235 (*) 70 - 99 mg/dL   Comment 1 Documented in Chart     Comment 2 Notify RN      Time spent in discharge (includes decision making & examination of pt): >35 minutes  02/01/2014, 1:00 PM   Cherene Altes, MD [facility] Office  [phone] Pager [phone]  On-Call/Text Page:      [email or web address]      password [facility]

## 2014-02-02 ENCOUNTER — Encounter (INDEPENDENT_AMBULATORY_CARE_PROVIDER_SITE_OTHER): Payer: Medicare Other

## 2014-02-02 DIAGNOSIS — I69920 Aphasia following unspecified cerebrovascular disease: Secondary | ICD-10-CM | POA: Diagnosis not present

## 2014-02-02 DIAGNOSIS — IMO0001 Reserved for inherently not codable concepts without codable children: Secondary | ICD-10-CM | POA: Diagnosis not present

## 2014-02-02 DIAGNOSIS — I69959 Hemiplegia and hemiparesis following unspecified cerebrovascular disease affecting unspecified side: Secondary | ICD-10-CM | POA: Diagnosis not present

## 2014-02-02 DIAGNOSIS — Z794 Long term (current) use of insulin: Secondary | ICD-10-CM | POA: Diagnosis not present

## 2014-02-02 DIAGNOSIS — E119 Type 2 diabetes mellitus without complications: Secondary | ICD-10-CM | POA: Diagnosis not present

## 2014-02-03 DIAGNOSIS — I309 Acute pericarditis, unspecified: Secondary | ICD-10-CM | POA: Diagnosis not present

## 2014-02-03 DIAGNOSIS — I69920 Aphasia following unspecified cerebrovascular disease: Secondary | ICD-10-CM | POA: Diagnosis not present

## 2014-02-03 DIAGNOSIS — I4891 Unspecified atrial fibrillation: Secondary | ICD-10-CM | POA: Diagnosis not present

## 2014-02-03 DIAGNOSIS — Z79899 Other long term (current) drug therapy: Secondary | ICD-10-CM | POA: Diagnosis not present

## 2014-02-03 DIAGNOSIS — R042 Hemoptysis: Secondary | ICD-10-CM | POA: Diagnosis not present

## 2014-02-03 DIAGNOSIS — R7989 Other specified abnormal findings of blood chemistry: Secondary | ICD-10-CM | POA: Diagnosis not present

## 2014-02-03 DIAGNOSIS — F329 Major depressive disorder, single episode, unspecified: Secondary | ICD-10-CM | POA: Diagnosis not present

## 2014-02-03 DIAGNOSIS — N179 Acute kidney failure, unspecified: Secondary | ICD-10-CM | POA: Diagnosis not present

## 2014-02-03 DIAGNOSIS — Z4802 Encounter for removal of sutures: Secondary | ICD-10-CM | POA: Diagnosis not present

## 2014-02-06 ENCOUNTER — Inpatient Hospital Stay: Payer: Medicare Other | Admitting: Adult Health

## 2014-02-06 DIAGNOSIS — I69920 Aphasia following unspecified cerebrovascular disease: Secondary | ICD-10-CM | POA: Diagnosis not present

## 2014-02-06 DIAGNOSIS — I69959 Hemiplegia and hemiparesis following unspecified cerebrovascular disease affecting unspecified side: Secondary | ICD-10-CM | POA: Diagnosis not present

## 2014-02-06 DIAGNOSIS — IMO0001 Reserved for inherently not codable concepts without codable children: Secondary | ICD-10-CM | POA: Diagnosis not present

## 2014-02-06 DIAGNOSIS — Z794 Long term (current) use of insulin: Secondary | ICD-10-CM | POA: Diagnosis not present

## 2014-02-06 DIAGNOSIS — E119 Type 2 diabetes mellitus without complications: Secondary | ICD-10-CM | POA: Diagnosis not present

## 2014-02-07 ENCOUNTER — Emergency Department (HOSPITAL_COMMUNITY): Payer: Medicare Other

## 2014-02-07 ENCOUNTER — Other Ambulatory Visit: Payer: Self-pay | Admitting: Surgery

## 2014-02-07 ENCOUNTER — Emergency Department (HOSPITAL_COMMUNITY)
Admission: EM | Admit: 2014-02-07 | Discharge: 2014-02-07 | Disposition: A | Discharge status: Home / Self Care | Payer: Medicare Other | Attending: Emergency Medicine | Admitting: Emergency Medicine

## 2014-02-07 ENCOUNTER — Encounter (HOSPITAL_COMMUNITY): Payer: Self-pay | Admitting: Emergency Medicine

## 2014-02-07 DIAGNOSIS — K219 Gastro-esophageal reflux disease without esophagitis: Secondary | ICD-10-CM | POA: Insufficient documentation

## 2014-02-07 DIAGNOSIS — E669 Obesity, unspecified: Secondary | ICD-10-CM | POA: Insufficient documentation

## 2014-02-07 DIAGNOSIS — Z79899 Other long term (current) drug therapy: Secondary | ICD-10-CM | POA: Diagnosis not present

## 2014-02-07 DIAGNOSIS — I10 Essential (primary) hypertension: Secondary | ICD-10-CM | POA: Insufficient documentation

## 2014-02-07 DIAGNOSIS — Z7901 Long term (current) use of anticoagulants: Secondary | ICD-10-CM | POA: Insufficient documentation

## 2014-02-07 DIAGNOSIS — E785 Hyperlipidemia, unspecified: Secondary | ICD-10-CM | POA: Diagnosis not present

## 2014-02-07 DIAGNOSIS — J45909 Unspecified asthma, uncomplicated: Secondary | ICD-10-CM | POA: Insufficient documentation

## 2014-02-07 DIAGNOSIS — Z862 Personal history of diseases of the blood and blood-forming organs and certain disorders involving the immune mechanism: Secondary | ICD-10-CM | POA: Insufficient documentation

## 2014-02-07 DIAGNOSIS — Z8739 Personal history of other diseases of the musculoskeletal system and connective tissue: Secondary | ICD-10-CM | POA: Insufficient documentation

## 2014-02-07 DIAGNOSIS — Z8669 Personal history of other diseases of the nervous system and sense organs: Secondary | ICD-10-CM | POA: Diagnosis not present

## 2014-02-07 DIAGNOSIS — IMO0002 Reserved for concepts with insufficient information to code with codable children: Secondary | ICD-10-CM | POA: Diagnosis not present

## 2014-02-07 DIAGNOSIS — Z8701 Personal history of pneumonia (recurrent): Secondary | ICD-10-CM | POA: Insufficient documentation

## 2014-02-07 DIAGNOSIS — I4891 Unspecified atrial fibrillation: Secondary | ICD-10-CM | POA: Diagnosis not present

## 2014-02-07 DIAGNOSIS — Z8673 Personal history of transient ischemic attack (TIA), and cerebral infarction without residual deficits: Secondary | ICD-10-CM | POA: Insufficient documentation

## 2014-02-07 DIAGNOSIS — Z880 Allergy status to penicillin: Secondary | ICD-10-CM | POA: Insufficient documentation

## 2014-02-07 DIAGNOSIS — IMO0001 Reserved for inherently not codable concepts without codable children: Secondary | ICD-10-CM | POA: Diagnosis not present

## 2014-02-07 DIAGNOSIS — Z794 Long term (current) use of insulin: Secondary | ICD-10-CM | POA: Insufficient documentation

## 2014-02-07 DIAGNOSIS — I209 Angina pectoris, unspecified: Secondary | ICD-10-CM | POA: Insufficient documentation

## 2014-02-07 DIAGNOSIS — I252 Old myocardial infarction: Secondary | ICD-10-CM | POA: Diagnosis not present

## 2014-02-07 DIAGNOSIS — J90 Pleural effusion, not elsewhere classified: Secondary | ICD-10-CM | POA: Diagnosis not present

## 2014-02-07 DIAGNOSIS — J9819 Other pulmonary collapse: Secondary | ICD-10-CM | POA: Diagnosis not present

## 2014-02-07 DIAGNOSIS — E119 Type 2 diabetes mellitus without complications: Secondary | ICD-10-CM | POA: Diagnosis not present

## 2014-02-07 DIAGNOSIS — Z87891 Personal history of nicotine dependence: Secondary | ICD-10-CM | POA: Insufficient documentation

## 2014-02-07 DIAGNOSIS — G4733 Obstructive sleep apnea (adult) (pediatric): Secondary | ICD-10-CM | POA: Insufficient documentation

## 2014-02-07 DIAGNOSIS — R0789 Other chest pain: Secondary | ICD-10-CM | POA: Diagnosis not present

## 2014-02-07 DIAGNOSIS — R042 Hemoptysis: Secondary | ICD-10-CM

## 2014-02-07 DIAGNOSIS — Z8639 Personal history of other endocrine, nutritional and metabolic disease: Secondary | ICD-10-CM | POA: Insufficient documentation

## 2014-02-07 DIAGNOSIS — I69959 Hemiplegia and hemiparesis following unspecified cerebrovascular disease affecting unspecified side: Secondary | ICD-10-CM | POA: Diagnosis not present

## 2014-02-07 DIAGNOSIS — I69920 Aphasia following unspecified cerebrovascular disease: Secondary | ICD-10-CM | POA: Diagnosis not present

## 2014-02-07 LAB — BASIC METABOLIC PANEL
BUN: 12 mg/dL (ref 6–23)
CALCIUM: 9.4 mg/dL (ref 8.4–10.5)
CO2: 22 meq/L (ref 19–32)
Chloride: 98 mEq/L (ref 96–112)
Creatinine, Ser: 0.98 mg/dL (ref 0.50–1.35)
GFR calc non Af Amer: 84 mL/min — ABNORMAL LOW (ref >90)
Glucose, Bld: 233 mg/dL — ABNORMAL HIGH (ref 70–99)
Potassium: 5 mEq/L (ref 3.7–5.3)
SODIUM: 137 meq/L (ref 137–147)

## 2014-02-07 LAB — CBC
HCT: 40.3 % (ref 39.0–52.0)
Hemoglobin: 13.2 g/dL (ref 13.0–17.0)
MCH: 26.8 pg (ref 26.0–34.0)
MCHC: 32.8 g/dL (ref 30.0–36.0)
MCV: 81.9 fL (ref 78.0–100.0)
Platelets: 390 10*3/uL (ref 150–400)
RBC: 4.92 MIL/uL (ref 4.22–5.81)
RDW: 15.6 % — AB (ref 11.5–15.5)
WBC: 15.5 10*3/uL — AB (ref 4.0–10.5)

## 2014-02-07 LAB — I-STAT TROPONIN, ED: Troponin i, poc: 0.01 ng/mL (ref 0.00–0.08)

## 2014-02-07 NOTE — ED Notes (Signed)
Pt noted to have incisions to left lateral chest wall. Steri strips present on middle incision. No heat or drainage noted.

## 2014-02-07 NOTE — ED Notes (Signed)
Pt c/o "tightness in chest" since last night. Denies SOB. He was recently discharged home after admission to hospital, the home health nurse came to check on him the morning and told his wife "she didn't like what she heard when she listened to him breathing and told him we better get to the ER." he is alert and breathing easily now

## 2014-02-07 NOTE — Discharge Instructions (Signed)

## 2014-02-07 NOTE — ED Notes (Signed)
MD at bedside. 

## 2014-02-07 NOTE — ED Provider Notes (Signed)
CSN: [id number]     Arrival date & time 02/07/14  1243 History   First MD Initiated Contact with Patient 02/07/14 1307     Chief Complaint  Patient presents with  . Chest Pain     (Consider location/radiation/quality/duration/timing/severity/associated sxs/prior Treatment) HPI Male with history of pleural effusion status post chest tube discharged 8 days ago after admission for stroke who presents today because his home health nurse thought his lungs sounded bad. He states that he has had some tightness in his chest that began last night and has been constant in nature. He also complains of pain in the left lateral chest at the chest tube sites. He denies any fever, chills, nausea, dyspnea, new weakness, or shortness of breath. He has not had any change in his cough and is coughing up only small amounts of white mucous. Past Medical History  Diagnosis Date  . Hypothyroidism   . Dysrhythmia     atrial fib takes cardizem,   . Hypertension   . Angina   . Restless leg syndrome   . GERD (gastroesophageal reflux disease)   . Hyperlipemia   . H/O hiatal hernia   . Neuromuscular disorder     rls, neuropathy  . Obesity (BMI 30-39.9)   . Pericardial effusion   . Asthma   . Cough     coughing up blood- started last nite, seems to be worse now  . Atrial fibrillation   . Myocardial infarction     "one dr says yes; another says no"  . On home oxygen therapy     "2L w/CPAP at bedtime" (01/18/2014)  . Pneumonia 5/15    "several times" (01/18/2014)  . OSA on CPAP     cpap & oxygen  . IDDM (insulin dependent diabetes mellitus)     insulin pump   . Stroke 2012    denies residual on 01/18/2014  . Arthritis     "knees" (01/18/2014)  . Fibromyalgia    Past Surgical History  Procedure Laterality Date  . Cataract extraction w/ intraocular lens  implant, bilateral Bilateral   . Sinus exploration  X 2  . Appendectomy    . Cholecystectomy    . Laparoscopic gastric banding  2010  . Video  bronchoscopy Bilateral 12/29/2013    Procedure: VIDEO BRONCHOSCOPY WITHOUT FLUORO;  Surgeon: Elsie Stain, MD;  Location: [facility];  Service: Endoscopy;  Laterality: Bilateral;  . Cardiac catheterization  X 2 then 03/03/2012     NL LVF, normal coronaries, vessels are small (HPR: Dr. Beatrix Fetters)  . Hernia repair      UHR  . Umbilical hernia repair    . Video assisted thoracoscopy Left 01/24/2014    Procedure: VIDEO ASSISTED THORACOSCOPY;  Surgeon: Gaye Pollack, MD;  Location: [facility] OR;  Service: Thoracic;  Laterality: Left;  VATS/open lung biopsy  . Pericardial window Left 01/24/2014    Procedure: PERICARDIAL WINDOW;  Surgeon: Gaye Pollack, MD;  Location: [facility];  Service: Thoracic;  Laterality: Left;  . Video bronchoscopy N/A 01/24/2014    Procedure: VIDEO BRONCHOSCOPY;  Surgeon: Gaye Pollack, MD;  Location: [facility] OR;  Service: Thoracic;  Laterality: N/A;   Family History  Problem Relation Age of Onset  . Anesthesia problems Neg Hx   . Hypotension Neg Hx   . Malignant hyperthermia Neg Hx   . Pseudochol deficiency Neg Hx   . Cancer Mother    History  Substance Use Topics  . Smoking status: Former Smoker -- 4  years    Types: Cigars    Quit date: 09/01/2006  . Smokeless tobacco: Never Used  . 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     Allergies  Codeine and Penicillins  Home Medications   Prior to Admission medications   Medication Sig Start Date End Date Taking? Authorizing Provider  albuterol (PROVENTIL HFA;VENTOLIN HFA) 108 (90 BASE) MCG/ACT inhaler Inhale 2 puffs into the lungs every 6 (six) hours as needed for wheezing or shortness of breath.   Yes Historical Provider, MD  atorvastatin (LIPITOR) 40 MG tablet Take 40 mg by mouth daily.   Yes Historical Provider, MD  azelastine (ASTELIN) 137 MCG/SPRAY nasal spray Place 1 spray into both nostrils at bedtime as needed for  allergies.    Yes Historical Provider, MD  Calcium Carbonate-Vitamin D (CALTRATE 600+D PO) Take 2 tablets by mouth 2 (two) times daily.    Yes Historical Provider, MD  diltiazem (CARDIZEM) 30 MG tablet Take 30 mg by mouth 2 (two) times daily.   Yes Historical Provider, MD  fluticasone (FLONASE) 50 MCG/ACT nasal spray Place 1 spray into both nostrils daily as needed for allergies.    Yes Historical Provider, MD  Fluticasone-Salmeterol (ADVAIR) 500-50 MCG/DOSE AEPB Inhale 1 puff into the lungs every 12 (twelve) hours.   Yes Historical Provider, MD  hydrALAZINE (APRESOLINE) 50 MG tablet Take 50 mg by mouth 4 (four) times daily.   Yes Historical Provider, MD  Insulin Human (INSULIN PUMP) 100 unit/ml SOLN Inject into the skin continuous. Inject 0.5 units of novolog every hour per insulin pump.   Yes Historical Provider, MD  ipratropium-albuterol (DUONEB) 0.5-2.5 (3) MG/3ML SOLN Take 3 mLs by nebulization every 6 (six) hours as needed. For shortness of breath   Yes Historical Provider, MD  isosorbide mononitrate (IMDUR) 60 MG 24 hr tablet Take 60 mg by mouth every morning.   Yes Historical Provider, MD  latanoprost (XALATAN) 0.005 % ophthalmic solution Place 1 drop into both eyes at bedtime.   Yes Historical Provider, MD  lisinopril (PRINIVIL,ZESTRIL) 40 MG tablet Take 40 mg by mouth 2 (two) times daily.   Yes Historical Provider, MD  losartan (COZAAR) 50 MG tablet Take 50 mg by mouth daily.   Yes Historical Provider, MD  magnesium oxide (MAG-OX) 400 MG tablet Take 1,600 mg by mouth daily.    Yes Historical Provider, MD  metolazone (ZAROXOLYN) 2.5 MG tablet Take 2.5 mg by mouth See admin instructions. Patient takes only on Monday Wednesday Friday. Take 30 minutes before furosemide   Yes Historical Provider, MD  Multiple Vitamins-Minerals (CENTRUM SILVER PO) Take 1 tablet by mouth daily.   Yes Historical Provider, MD  oxyCODONE-acetaminophen (PERCOCET/ROXICET) 5-325 MG per tablet Take 1 tablet by mouth 4  (four) times daily as needed for severe pain. For severe pain 01/28/14  Yes Donielle Liston Alba, PA-C  pantoprazole (PROTONIX) 40 MG tablet Take 40 mg by mouth 2 (two) times daily.   Yes Historical Provider, MD  Polyethyl Glycol-Propyl Glycol (SYSTANE PRESERVATIVE FREE) 0.4-0.3 % SOLN Apply 1 drop to eye 4 (four) times daily as needed (dryness of eye).    Yes Historical Provider, MD  potassium chloride SA (K-DUR,KLOR-CON) 20 MEQ tablet Take 20 mEq by mouth 3 (three) times daily.   Yes Historical Provider, MD  pramipexole (MIRAPEX) 1.5 MG tablet Take 1.5 mg by mouth at bedtime.  Yes Historical Provider, MD  pregabalin (LYRICA) 75 MG capsule Take 1 capsule (75 mg total) by mouth 2 (two) times daily. 01/05/14  Yes Grace Bushy Minor, NP  rivaroxaban (XARELTO) 20 MG TABS tablet Take 1 tablet (20 mg total) by mouth daily with supper. 02/01/14  Yes Cherene Altes, MD  triamcinolone (NASACORT) 55 MCG/ACT nasal inhaler Place 2 sprays into both nostrils daily as needed (allergies).    Yes Historical Provider, MD  nitroGLYCERIN (NITROSTAT) 0.4 MG SL tablet Place 0.4 mg under the tongue every 5 (five) minutes as needed. For chest pain    Historical Provider, MD  NOVOLOG 100 UNIT/ML injection As directed via insulin pump    Historical Provider, MD   BP 122/77  Pulse 99  Temp(Src) 98.5 F (36.9 C) (Oral)  Resp 18  Ht 5\' 7"  (1.702 m)  SpO2 95% Physical Exam  Nursing note and vitals reviewed. Constitutional: He is oriented to person, place, and time. He appears well-developed and well-nourished.  Obese  HENT:  Head: Normocephalic and atraumatic.  Right Ear: External ear normal.  Left Ear: External ear normal.  Nose: Nose normal.  Mouth/Throat: Oropharynx is clear and moist.  Eyes: Conjunctivae and EOM are normal. Pupils are equal, round, and reactive to light.  Neck: Normal range of motion. Neck supple.  Cardiovascular: Normal rate, regular rhythm, normal heart sounds and intact distal pulses.    Pulmonary/Chest: Effort normal and breath sounds normal. He has no wheezes. He has no rales.  2 well-healing sites lateral chest consistent with previous chest tube.  Abdominal: Soft. Bowel sounds are normal.  Musculoskeletal: Normal range of motion. He exhibits no edema and no tenderness.  Neurological: He is alert and oriented to person, place, and time. He has normal reflexes.  Skin: Skin is warm and dry.  Psychiatric: He has a normal mood and affect. His behavior is normal. Judgment and thought content normal.    ED Course  Procedures (including critical care time) Labs Review Labs Reviewed  CBC - Abnormal; Notable for the following:    WBC 15.5 (*)    RDW 15.6 (*)    All other components within normal limits  BASIC METABOLIC PANEL - Abnormal; Notable for the following:    Glucose, Bld 233 (*)    GFR calc non Af Amer 84 (*)    All other components within normal limits  I-STAT TROPOININ, ED    Imaging Review Dg Chest 2 View  02/07/2014   CLINICAL DATA:  Shortness breath and chest pain; history of recent pericardiocentesis and previous CVA  EXAM: CHEST  2 VIEW  COMPARISON:  PA and lateral chest of January 30, 2014  FINDINGS: The right lung is well-expanded and clear. On the left there is persistent increased density at the base which has improved since the previous study. The cardiac silhouette remains mildly enlarged. The pulmonary vascularity is not engorged. The mediastinum is normal in width. There is degenerative disc change of the mid thoracic spine at multiple levels.  IMPRESSION: There is an improved appearance of the left lung base consistent with resolving atelectasis and pleural effusion.   Electronically Signed   By: David  Martinique   On: 02/07/2014 14:02     EKG Interpretation   Date/Time:  Tuesday February 07 2014 12:47:33 EDT Ventricular Rate:  100 PR Interval:    QRS Duration: 116 QT Interval:  340 QTC Calculation: 438 R Axis:   89 Text Interpretation:  Atrial  fibrillation Anteroseptal infarct , age  undetermined T wave abnormality, consider inferior ischemia or digitalis  effect Abnormal ECG SINCE LAST TRACING HEART RATE HAS INCREASED Confirmed  by Garry Nicolini MD, Andee Poles [phone]) on 02/07/2014 1:12:17 PM      MDM   Final diagnoses:  Chest tightness    Patient Is a 63 year old male from home who presents with concerns for recurrent pleural effusion. Chest x-Shelitha Magley is clear. He did have some mild substernal tightness. EKG does not appear to have any veins from prior EKG and troponin is normal. Symptoms have been present for over 12 hours. I have discussed return precautions with the patient and his wife and they voice understanding    Shaune Pollack, MD 02/07/14 [phone]

## 2014-02-08 ENCOUNTER — Encounter: Payer: Self-pay | Admitting: Surgery

## 2014-02-08 ENCOUNTER — Ambulatory Visit (INDEPENDENT_AMBULATORY_CARE_PROVIDER_SITE_OTHER): Payer: Self-pay | Admitting: Surgery

## 2014-02-08 VITALS — BP 130/65 | HR 92 | Resp 16 | Ht 67.0 in

## 2014-02-08 DIAGNOSIS — I319 Disease of pericardium, unspecified: Secondary | ICD-10-CM

## 2014-02-08 DIAGNOSIS — I313 Pericardial effusion (noninflammatory): Secondary | ICD-10-CM

## 2014-02-08 DIAGNOSIS — Z09 Encounter for follow-up examination after completed treatment for conditions other than malignant neoplasm: Secondary | ICD-10-CM

## 2014-02-08 DIAGNOSIS — I3139 Other pericardial effusion (noninflammatory): Secondary | ICD-10-CM

## 2014-02-08 NOTE — Progress Notes (Signed)
HPI:  Mr. Schwinghammer returns today with his wife for follow up after left VATS for pericardial window and open left lung biopsy. He had an uncomplicated postop course and was not restarted on Xarelto for atrial fibrillation due to his recent recurrent hemoptysis. 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 He is back at home now and recovering well from the stroke. He is back on Xarelto and has not had any hemoptysis. He was seen yesterday by his primary physician and complained of some chest pain. He tells me that it was to the right side of the sternum. It was not exertional or positional. He denies dyspnea. He is not having any today. The pathology of the pericardial biopsy was benign. The pericardial fluid cytology was negative. 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  Current Outpatient Prescriptions  Medication Sig Dispense Refill  . albuterol (PROVENTIL HFA;VENTOLIN HFA) 108 (90 BASE) MCG/ACT inhaler Inhale 2 puffs into the lungs every 6 (six) hours as needed for wheezing or shortness of breath.      Marland Kitchen atorvastatin (LIPITOR) 40 MG tablet Take 40 mg by mouth daily.      Marland Kitchen azelastine (ASTELIN) 137 MCG/SPRAY nasal spray Place 1 spray into both nostrils at bedtime as needed for allergies.       . Calcium Carbonate-Vitamin D (CALTRATE 600+D PO) Take 2 tablets by mouth 2 (two) times daily.       Marland Kitchen diltiazem (CARDIZEM) 30 MG tablet Take 30 mg by mouth 2 (two) times daily.      . fluticasone (FLONASE) 50 MCG/ACT nasal spray Place 1 spray into both nostrils daily as needed for allergies.       . Fluticasone-Salmeterol (ADVAIR) 500-50 MCG/DOSE AEPB Inhale 1 puff into the lungs every 12 (twelve) hours.      . hydrALAZINE (APRESOLINE) 50 MG tablet Take 50 mg by mouth 4 (four) times daily.      . Insulin Human (INSULIN PUMP)  100 unit/ml SOLN Inject into the skin continuous. Inject 0.5 units of novolog every hour per insulin pump.      Marland Kitchen ipratropium-albuterol (DUONEB) 0.5-2.5 (3) MG/3ML SOLN Take 3 mLs by nebulization every 6 (six) hours as needed. For shortness of breath      . isosorbide mononitrate (IMDUR) 60 MG 24 hr tablet Take 60 mg by mouth every morning.      . latanoprost (XALATAN) 0.005 % ophthalmic solution Place 1 drop into both eyes at bedtime.      Marland Kitchen lisinopril (PRINIVIL,ZESTRIL) 40 MG tablet Take 40 mg by mouth 2 (two) times daily.      Marland Kitchen losartan (COZAAR) 50 MG tablet Take 50 mg by mouth daily.      . magnesium oxide (MAG-OX) 400 MG tablet Take 1,600 mg by mouth daily.       . metolazone (ZAROXOLYN) 2.5 MG tablet Take 2.5 mg by mouth See admin instructions. Patient takes only on Monday Wednesday Friday. Take 30 minutes before furosemide      . Multiple Vitamins-Minerals (CENTRUM SILVER PO) Take 1 tablet by mouth daily.      . nitroGLYCERIN (NITROSTAT) 0.4 MG SL tablet Place 0.4 mg under the tongue every 5 (five) minutes as needed. For chest pain      . NOVOLOG 100 UNIT/ML injection As directed via insulin pump      .  oxyCODONE-acetaminophen (PERCOCET/ROXICET) 5-325 MG per tablet Take 1 tablet by mouth 4 (four) times daily as needed for severe pain. For severe pain  30 tablet  0  . pantoprazole (PROTONIX) 40 MG tablet Take 40 mg by mouth 2 (two) times daily.      Vladimir Faster Glycol-Propyl Glycol (SYSTANE PRESERVATIVE FREE) 0.4-0.3 % SOLN Apply 1 drop to eye 4 (four) times daily as needed (dryness of eye).       . potassium chloride SA (K-DUR,KLOR-CON) 20 MEQ tablet Take 20 mEq by mouth 3 (three) times daily.      . pramipexole (MIRAPEX) 1.5 MG tablet Take 1.5 mg by mouth at bedtime.      . pregabalin (LYRICA) 75 MG capsule Take 1 capsule (75 mg total) by mouth 2 (two) times daily.  60 capsule  0  . rivaroxaban (XARELTO) 20 MG TABS tablet Take 1 tablet (20 mg total) by mouth daily with supper.  30 tablet  0   . triamcinolone (NASACORT) 55 MCG/ACT nasal inhaler Place 2 sprays into both nostrils daily as needed (allergies).        No current facility-administered medications for this visit.     Physical Exam: BP 130/65  Pulse 92  Resp 16  Ht 5\' 7"  (1.702 m)  SpO2 98% He looks well but is not as cheerful and talkative as usual Lungs reveal decreased breath sounds in the bases. Cardiac exam shows an irregular rate and rhythm with normal heart sounds. The chest incisions are healing well.   Diagnostic Tests:  CLINICAL DATA: Shortness breath and chest pain; history of recent  pericardiocentesis and previous CVA  EXAM:  CHEST 2 VIEW  COMPARISON: PA and lateral chest of January 30, 2014  FINDINGS:  The right lung is well-expanded and clear. On the left there is  persistent increased density at the base which has improved since  the previous study. The cardiac silhouette remains mildly enlarged.  The pulmonary vascularity is not engorged. The mediastinum is normal  in width. There is degenerative disc change of the mid thoracic  spine at multiple levels.  IMPRESSION:  There is an improved appearance of the left lung base consistent  with resolving atelectasis and pleural effusion.  Electronically Signed  By: David Martinique  On: 02/07/2014 14:02   Impression:  He is making a satisfactory recovery from his chest surgery especially considering his readmission with an embolic stroke most likely related to atrial fibrillation. I think we have to assume that his hemoptysis was related to this organizing pneumonia which can be a recurrent problem. He has shown [place] that he is at high risk for embolic stroke and will need to remain on anticoagulation if at all possible indefinitely.  Plan:  He is going to follow up with Dr. [name] in [place] concerning his pulmonary disease and recent lung biopsy results. He has an appointment with cardiology at [facility] at his request in the near future.

## 2014-02-09 ENCOUNTER — Encounter: Payer: Self-pay | Admitting: Cardiology

## 2014-02-09 ENCOUNTER — Ambulatory Visit (INDEPENDENT_AMBULATORY_CARE_PROVIDER_SITE_OTHER): Payer: Medicare Other | Admitting: Cardiology

## 2014-02-09 VITALS — BP 110/58 | HR 102 | Ht 67.0 in | Wt 256.0 lb

## 2014-02-09 DIAGNOSIS — I6350 Cerebral infarction due to unspecified occlusion or stenosis of unspecified cerebral artery: Secondary | ICD-10-CM | POA: Diagnosis not present

## 2014-02-09 DIAGNOSIS — G4730 Sleep apnea, unspecified: Secondary | ICD-10-CM

## 2014-02-09 DIAGNOSIS — I10 Essential (primary) hypertension: Secondary | ICD-10-CM

## 2014-02-09 DIAGNOSIS — E669 Obesity, unspecified: Secondary | ICD-10-CM | POA: Diagnosis not present

## 2014-02-09 DIAGNOSIS — Z7901 Long term (current) use of anticoagulants: Secondary | ICD-10-CM | POA: Insufficient documentation

## 2014-02-09 DIAGNOSIS — E1149 Type 2 diabetes mellitus with other diabetic neurological complication: Secondary | ICD-10-CM

## 2014-02-09 DIAGNOSIS — I639 Cerebral infarction, unspecified: Secondary | ICD-10-CM

## 2014-02-09 DIAGNOSIS — I3139 Other pericardial effusion (noninflammatory): Secondary | ICD-10-CM

## 2014-02-09 DIAGNOSIS — I313 Pericardial effusion (noninflammatory): Secondary | ICD-10-CM

## 2014-02-09 DIAGNOSIS — I4891 Unspecified atrial fibrillation: Secondary | ICD-10-CM | POA: Diagnosis not present

## 2014-02-09 DIAGNOSIS — I319 Disease of pericardium, unspecified: Secondary | ICD-10-CM

## 2014-02-09 HISTORY — DX: Long term (current) use of anticoagulants: Z79.01

## 2014-02-09 HISTORY — DX: Cerebral infarction, unspecified: I63.9

## 2014-02-09 NOTE — Patient Instructions (Signed)
Your physician recommends that you schedule a follow-up appointment in:  3 months to see Dr. Debara Pickett

## 2014-02-09 NOTE — Assessment & Plan Note (Signed)
S/P VATS, pericardial window, and lung biopsy 01/18/14 (Dr Cyndia Bent)

## 2014-02-09 NOTE — Assessment & Plan Note (Signed)
"  For years" per pt

## 2014-02-09 NOTE — Assessment & Plan Note (Signed)
Xarelto

## 2014-02-09 NOTE — Assessment & Plan Note (Signed)
LMCA CVA 01/30/14 while off antibiotics s/p VATS

## 2014-02-09 NOTE — Assessment & Plan Note (Signed)
BMI 40 

## 2014-02-09 NOTE — Assessment & Plan Note (Signed)
Controlled.  

## 2014-02-09 NOTE — Assessment & Plan Note (Addendum)
Followed by Dr Tobie Poet, on insulin pump

## 2014-02-09 NOTE — Assessment & Plan Note (Signed)
cpap & oxygen

## 2014-02-09 NOTE — Progress Notes (Signed)
02/09/2014 Jon [name]   [birth year]  [id number]  Primary Physicia Rochel Brome, MD Primary Cardiologist: Dr Debara Pickett  HPI:  This is a 63 y.o. male with a past medical history significant for IDDM with neuropathy, OSA on CPAP, prior stroke, COPD, HTN, hyperlipidemia, GERD, and morbid obesity. He is followed by Dr Tobie Poet in [place]. He had previously been followed by Dr Bettina Gavia but has requested we take over his care. He has a history of prior cath in [place] in 2011 reportedly showing no significant CAD. He has CAF "for years" per pt. He has been on chronic anticoagulation.           He presented 01/03/14 with CAP and Dr Debara Pickett saw him in consult. Chest CT showed a large pericardia effusion. Xarelto was held secondary to effusion and hemoptysis. He was re admitted 01/18/14 with hemoptysis. He was readmitted 01/18/14 with recurrent hemoptysis. He underwent VATS, pericardial window, and lung biopsy 01/24/14. On 01/30/14 he was re admitted with Lt brain stroke while off Xarelto post op. He seems to have recovered. Xarelto was resumed. He is in the office today to get established with a cardiologist as he has severed ties with Dr Bettina Gavia. Since discharge he has done well. No unusual chest pain or dyspnea. No bleeding noted.    Current Outpatient Prescriptions  Medication Sig Dispense Refill  . albuterol (PROVENTIL HFA;VENTOLIN HFA) 108 (90 BASE) MCG/ACT inhaler Inhale 2 puffs into the lungs every 6 (six) hours as needed for wheezing or shortness of breath.      Marland Kitchen atorvastatin (LIPITOR) 40 MG tablet Take 40 mg by mouth daily.      Marland Kitchen azelastine (ASTELIN) 137 MCG/SPRAY nasal spray Place 1 spray into both nostrils at bedtime as needed for allergies.       . Calcium Carbonate-Vitamin D (CALTRATE 600+D PO) Take 2 tablets by mouth 2 (two) times daily.       Marland Kitchen diltiazem (CARDIZEM) 30 MG tablet Take 30 mg by mouth 2 (two) times daily.      . fluticasone (FLONASE) 50 MCG/ACT nasal spray Place 1 spray into both nostrils daily  as needed for allergies.       . Fluticasone-Salmeterol (ADVAIR) 500-50 MCG/DOSE AEPB Inhale 1 puff into the lungs every 12 (twelve) hours.      . hydrALAZINE (APRESOLINE) 50 MG tablet Take 50 mg by mouth 4 (four) times daily.      . Insulin Human (INSULIN PUMP) 100 unit/ml SOLN Inject into the skin continuous. Inject 0.5 units of novolog every hour per insulin pump.      Marland Kitchen ipratropium-albuterol (DUONEB) 0.5-2.5 (3) MG/3ML SOLN Take 3 mLs by nebulization every 6 (six) hours as needed. For shortness of breath      . isosorbide mononitrate (IMDUR) 60 MG 24 hr tablet Take 60 mg by mouth every morning.      . latanoprost (XALATAN) 0.005 % ophthalmic solution Place 1 drop into both eyes at bedtime.      Marland Kitchen lisinopril (PRINIVIL,ZESTRIL) 40 MG tablet Take 40 mg by mouth 2 (two) times daily.      Marland Kitchen losartan (COZAAR) 50 MG tablet Take 50 mg by mouth daily.      . magnesium oxide (MAG-OX) 400 MG tablet Take 1,600 mg by mouth daily.       . metolazone (ZAROXOLYN) 2.5 MG tablet Take 2.5 mg by mouth See admin instructions. Patient takes only on Monday Wednesday Friday. Take 30 minutes before furosemide      .  Multiple Vitamins-Minerals (CENTRUM SILVER PO) Take 1 tablet by mouth daily.      . nitroGLYCERIN (NITROSTAT) 0.4 MG SL tablet Place 0.4 mg under the tongue every 5 (five) minutes as needed. For chest pain      . NOVOLOG 100 UNIT/ML injection As directed via insulin pump      . oxyCODONE-acetaminophen (PERCOCET/ROXICET) 5-325 MG per tablet Take 1 tablet by mouth 4 (four) times daily as needed for severe pain. For severe pain  30 tablet  0  . pantoprazole (PROTONIX) 40 MG tablet Take 40 mg by mouth 2 (two) times daily.      Vladimir Faster Glycol-Propyl Glycol (SYSTANE PRESERVATIVE FREE) 0.4-0.3 % SOLN Apply 1 drop to eye 4 (four) times daily as needed (dryness of eye).       . potassium chloride SA (K-DUR,KLOR-CON) 20 MEQ tablet Take 20 mEq by mouth 3 (three) times daily.      . pramipexole (MIRAPEX) 1.5 MG  tablet Take 1.5 mg by mouth at bedtime.      . pregabalin (LYRICA) 75 MG capsule Take 1 capsule (75 mg total) by mouth 2 (two) times daily.  60 capsule  0  . rivaroxaban (XARELTO) 20 MG TABS tablet Take 1 tablet (20 mg total) by mouth daily with supper.  30 tablet  0  . triamcinolone (NASACORT) 55 MCG/ACT nasal inhaler Place 2 sprays into both nostrils daily as needed (allergies).       . zolpidem (AMBIEN) 10 MG tablet        No current facility-administered medications for this visit.    Allergies  Allergen Reactions  . Codeine Itching  . Penicillins Rash    History   Social History  . Marital Status: Married    Spouse Name: N/A    Number of Children: N/A  . Years of Education: N/A   Occupational History  . Not on file.   Social History Main Topics  . Smoking status: Former Smoker -- 4 years    Types: Cigars    Quit date: 09/01/2006  . Smokeless tobacco: Never Used  . 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 Drug Use: No  . Sexual Activity: No   Other Topics Concern  . Not on file   Social History Narrative  . No narrative on file     Review of Systems: General: negative for chills, fever, night sweats or weight changes.  Cardiovascular: negative for chest pain, dyspnea on exertion, edema, orthopnea, palpitations, paroxysmal nocturnal dyspnea or shortness of breath Dermatological: negative for rash Respiratory: negative for cough or wheezing Urologic: negative for hematuria Abdominal: negative for nausea, vomiting, diarrhea, bright red blood per rectum, melena, or hematemesis Neurologic: negative for visual changes, syncope, or dizziness All other systems reviewed and are otherwise negative except as noted above.    Blood pressure 110/58, pulse 102, height 5\' 7"  (1.702 m), weight 256 lb (116.121 kg).  General appearance: alert, cooperative, no distress and morbidly obese Neck: no carotid bruit and no JVD Lungs: decreased  lt base, no rales Heart: regular rate and rhythm Abdomen: obese Extremities: no edema Skin: cool and dry Neurologic: Grossly normal  EKG AF with IVCD  ASSESSMENT AND PLAN:   Chronic atrial fibrillation "For years" per pt  Hypertension Controlled  Obesity (BMI 30-39.9) BMI 40  Sleep apnea cpap & oxygen  Type II or unspecified type diabetes mellitus with neurological manifestations, not stated as uncontrolled Followed by  Dr Tobie Poet, on insulin pump  Pericardial effusion S/P VATS, pericardial window, and lung biopsy 01/18/14 (Dr Cyndia Bent)  CVA (cerebral vascular accident) LMCA CVA 01/30/14 while off antibiotics s/p VATS  Chronic anticoagulation Xarelto   PLAN  Follow up with Dr Debara Pickett 3 months.   [facility] 02/09/2014 4:58 PM

## 2014-02-10 DIAGNOSIS — I69920 Aphasia following unspecified cerebrovascular disease: Secondary | ICD-10-CM | POA: Diagnosis not present

## 2014-02-10 DIAGNOSIS — IMO0001 Reserved for inherently not codable concepts without codable children: Secondary | ICD-10-CM | POA: Diagnosis not present

## 2014-02-10 DIAGNOSIS — Z794 Long term (current) use of insulin: Secondary | ICD-10-CM | POA: Diagnosis not present

## 2014-02-10 DIAGNOSIS — E119 Type 2 diabetes mellitus without complications: Secondary | ICD-10-CM | POA: Diagnosis not present

## 2014-02-10 DIAGNOSIS — I69959 Hemiplegia and hemiparesis following unspecified cerebrovascular disease affecting unspecified side: Secondary | ICD-10-CM | POA: Diagnosis not present

## 2014-02-10 LAB — AFB CULTURE WITH SMEAR (NOT AT ARMC)
Acid Fast Smear: NONE SEEN
Special Requests: NORMAL

## 2014-02-11 DIAGNOSIS — IMO0001 Reserved for inherently not codable concepts without codable children: Secondary | ICD-10-CM | POA: Diagnosis not present

## 2014-02-11 DIAGNOSIS — Z794 Long term (current) use of insulin: Secondary | ICD-10-CM | POA: Diagnosis not present

## 2014-02-11 DIAGNOSIS — I69959 Hemiplegia and hemiparesis following unspecified cerebrovascular disease affecting unspecified side: Secondary | ICD-10-CM | POA: Diagnosis not present

## 2014-02-11 DIAGNOSIS — I69920 Aphasia following unspecified cerebrovascular disease: Secondary | ICD-10-CM | POA: Diagnosis not present

## 2014-02-11 DIAGNOSIS — E119 Type 2 diabetes mellitus without complications: Secondary | ICD-10-CM | POA: Diagnosis not present

## 2014-02-13 DIAGNOSIS — Z794 Long term (current) use of insulin: Secondary | ICD-10-CM | POA: Diagnosis not present

## 2014-02-13 DIAGNOSIS — I69920 Aphasia following unspecified cerebrovascular disease: Secondary | ICD-10-CM | POA: Diagnosis not present

## 2014-02-13 DIAGNOSIS — I69959 Hemiplegia and hemiparesis following unspecified cerebrovascular disease affecting unspecified side: Secondary | ICD-10-CM | POA: Diagnosis not present

## 2014-02-13 DIAGNOSIS — E119 Type 2 diabetes mellitus without complications: Secondary | ICD-10-CM | POA: Diagnosis not present

## 2014-02-13 DIAGNOSIS — IMO0001 Reserved for inherently not codable concepts without codable children: Secondary | ICD-10-CM | POA: Diagnosis not present

## 2014-02-14 DIAGNOSIS — IMO0001 Reserved for inherently not codable concepts without codable children: Secondary | ICD-10-CM | POA: Diagnosis not present

## 2014-02-14 DIAGNOSIS — E119 Type 2 diabetes mellitus without complications: Secondary | ICD-10-CM | POA: Diagnosis not present

## 2014-02-14 DIAGNOSIS — I69920 Aphasia following unspecified cerebrovascular disease: Secondary | ICD-10-CM | POA: Diagnosis not present

## 2014-02-14 DIAGNOSIS — I69959 Hemiplegia and hemiparesis following unspecified cerebrovascular disease affecting unspecified side: Secondary | ICD-10-CM | POA: Diagnosis not present

## 2014-02-14 DIAGNOSIS — Z794 Long term (current) use of insulin: Secondary | ICD-10-CM | POA: Diagnosis not present

## 2014-02-15 ENCOUNTER — Ambulatory Visit: Payer: Medicare Other | Admitting: Surgery

## 2014-02-15 DIAGNOSIS — IMO0001 Reserved for inherently not codable concepts without codable children: Secondary | ICD-10-CM | POA: Diagnosis not present

## 2014-02-15 DIAGNOSIS — Z794 Long term (current) use of insulin: Secondary | ICD-10-CM | POA: Diagnosis not present

## 2014-02-15 DIAGNOSIS — I69959 Hemiplegia and hemiparesis following unspecified cerebrovascular disease affecting unspecified side: Secondary | ICD-10-CM | POA: Diagnosis not present

## 2014-02-15 DIAGNOSIS — E119 Type 2 diabetes mellitus without complications: Secondary | ICD-10-CM | POA: Diagnosis not present

## 2014-02-15 DIAGNOSIS — I69920 Aphasia following unspecified cerebrovascular disease: Secondary | ICD-10-CM | POA: Diagnosis not present

## 2014-02-16 DIAGNOSIS — Z794 Long term (current) use of insulin: Secondary | ICD-10-CM | POA: Diagnosis not present

## 2014-02-16 DIAGNOSIS — I4891 Unspecified atrial fibrillation: Secondary | ICD-10-CM | POA: Diagnosis not present

## 2014-02-16 DIAGNOSIS — Z79899 Other long term (current) drug therapy: Secondary | ICD-10-CM | POA: Diagnosis not present

## 2014-02-16 DIAGNOSIS — E119 Type 2 diabetes mellitus without complications: Secondary | ICD-10-CM | POA: Diagnosis not present

## 2014-02-16 DIAGNOSIS — I69959 Hemiplegia and hemiparesis following unspecified cerebrovascular disease affecting unspecified side: Secondary | ICD-10-CM | POA: Diagnosis not present

## 2014-02-16 DIAGNOSIS — R7989 Other specified abnormal findings of blood chemistry: Secondary | ICD-10-CM | POA: Diagnosis not present

## 2014-02-16 DIAGNOSIS — IMO0001 Reserved for inherently not codable concepts without codable children: Secondary | ICD-10-CM | POA: Diagnosis not present

## 2014-02-16 DIAGNOSIS — N179 Acute kidney failure, unspecified: Secondary | ICD-10-CM | POA: Diagnosis not present

## 2014-02-16 DIAGNOSIS — E1149 Type 2 diabetes mellitus with other diabetic neurological complication: Secondary | ICD-10-CM | POA: Diagnosis not present

## 2014-02-16 DIAGNOSIS — I69920 Aphasia following unspecified cerebrovascular disease: Secondary | ICD-10-CM | POA: Diagnosis not present

## 2014-02-20 DIAGNOSIS — I69920 Aphasia following unspecified cerebrovascular disease: Secondary | ICD-10-CM | POA: Diagnosis not present

## 2014-02-20 DIAGNOSIS — E119 Type 2 diabetes mellitus without complications: Secondary | ICD-10-CM | POA: Diagnosis not present

## 2014-02-20 DIAGNOSIS — Z794 Long term (current) use of insulin: Secondary | ICD-10-CM | POA: Diagnosis not present

## 2014-02-20 DIAGNOSIS — IMO0001 Reserved for inherently not codable concepts without codable children: Secondary | ICD-10-CM | POA: Diagnosis not present

## 2014-02-20 DIAGNOSIS — I69959 Hemiplegia and hemiparesis following unspecified cerebrovascular disease affecting unspecified side: Secondary | ICD-10-CM | POA: Diagnosis not present

## 2014-02-21 DIAGNOSIS — I69959 Hemiplegia and hemiparesis following unspecified cerebrovascular disease affecting unspecified side: Secondary | ICD-10-CM | POA: Diagnosis not present

## 2014-02-21 DIAGNOSIS — Z794 Long term (current) use of insulin: Secondary | ICD-10-CM | POA: Diagnosis not present

## 2014-02-21 DIAGNOSIS — IMO0001 Reserved for inherently not codable concepts without codable children: Secondary | ICD-10-CM | POA: Diagnosis not present

## 2014-02-21 DIAGNOSIS — E119 Type 2 diabetes mellitus without complications: Secondary | ICD-10-CM | POA: Diagnosis not present

## 2014-02-21 DIAGNOSIS — I69920 Aphasia following unspecified cerebrovascular disease: Secondary | ICD-10-CM | POA: Diagnosis not present

## 2014-02-22 DIAGNOSIS — I69920 Aphasia following unspecified cerebrovascular disease: Secondary | ICD-10-CM | POA: Diagnosis not present

## 2014-02-22 DIAGNOSIS — IMO0001 Reserved for inherently not codable concepts without codable children: Secondary | ICD-10-CM | POA: Diagnosis not present

## 2014-02-22 DIAGNOSIS — E119 Type 2 diabetes mellitus without complications: Secondary | ICD-10-CM | POA: Diagnosis not present

## 2014-02-22 DIAGNOSIS — I69959 Hemiplegia and hemiparesis following unspecified cerebrovascular disease affecting unspecified side: Secondary | ICD-10-CM | POA: Diagnosis not present

## 2014-02-22 DIAGNOSIS — Z794 Long term (current) use of insulin: Secondary | ICD-10-CM | POA: Diagnosis not present

## 2014-02-28 ENCOUNTER — Ambulatory Visit (INDEPENDENT_AMBULATORY_CARE_PROVIDER_SITE_OTHER): Payer: Self-pay | Admitting: Critical Care Medicine

## 2014-02-28 ENCOUNTER — Encounter: Payer: Self-pay | Admitting: Critical Care Medicine

## 2014-02-28 VITALS — BP 140/78 | HR 75 | Temp 98.5°F | Ht 68.0 in | Wt 252.0 lb

## 2014-02-28 DIAGNOSIS — J8409 Other alveolar and parieto-alveolar conditions: Secondary | ICD-10-CM

## 2014-02-28 DIAGNOSIS — I3139 Other pericardial effusion (noninflammatory): Secondary | ICD-10-CM

## 2014-02-28 DIAGNOSIS — Z8673 Personal history of transient ischemic attack (TIA), and cerebral infarction without residual deficits: Secondary | ICD-10-CM

## 2014-02-28 DIAGNOSIS — R042 Hemoptysis: Secondary | ICD-10-CM

## 2014-02-28 DIAGNOSIS — K219 Gastro-esophageal reflux disease without esophagitis: Secondary | ICD-10-CM

## 2014-02-28 DIAGNOSIS — J8489 Other specified interstitial pulmonary diseases: Secondary | ICD-10-CM

## 2014-02-28 DIAGNOSIS — I313 Pericardial effusion (noninflammatory): Secondary | ICD-10-CM

## 2014-02-28 DIAGNOSIS — J449 Chronic obstructive pulmonary disease, unspecified: Secondary | ICD-10-CM | POA: Diagnosis not present

## 2014-02-28 MED ORDER — FAMOTIDINE 20 MG PO TABS: 20.0000 mg | ORAL_TABLET | Freq: Every day | ORAL | Status: DC

## 2014-02-28 MED ORDER — PREDNISONE 10 MG PO TABS: ORAL_TABLET | ORAL | Status: DC

## 2014-02-28 NOTE — Assessment & Plan Note (Signed)
S/p pericardial window Monitor

## 2014-02-28 NOTE — Assessment & Plan Note (Signed)
Boop on OLBx.  Now starting po steroids for same All micro data neg  Hemoptysis is d/t BOOP and xarelto use CVA off xarelto so will have to maintain Xarelto Plan Pulse prednisone 40mg  daily and slow taper Monitor CBGs

## 2014-02-28 NOTE — Patient Instructions (Addendum)
Stay on xarelto Start prednisone 10mg  Take 4 for four days 3 for four days then 2 daily and stay No other medication changes Take Pepcid 20mg  at bedtime in addition to protonix twice a day, take 30 min before a meal Pepcid and prednisone sent to [facility]  Return 3 weeks

## 2014-02-28 NOTE — Assessment & Plan Note (Signed)
S/p CVA off xarelto Now on xarelto with only residual mild aphasia PT completed

## 2014-02-28 NOTE — Progress Notes (Signed)
Subjective:    Patient ID: Jon [name], male    DOB: Feb 19, 1951, 63 y.o.   MRN: [id number]  HPI  02/28/2014 Chief Complaint  Patient presents with  . Follow-up    sob worse, increased heartburn(even with a glass of water),cough-can not get it up,coughed up dime sized bright bld. 2x since last wk.fever 99.4 on sat.,soreness on left side of chest  Pt was in hosp and had OLBx and pericardial window 5/20- 5/29. Pt then readmitted for CVA 01/30/14  til 02/01/14 following week d/t being off xarelto. Now back on xarelto OLBx showed BOOP.  No positve micro data Not as much bleeding.  From stroke, only issue is speech. Aphasia. Pt had some weakness in legs but is better.  PT has released the pt. Pt still with dyspnea.  No real wheeze. Not much coming up. Notes low grade fever.  Sore on Left side of OLBx  CBGs 110-130     Review of Systems  Constitutional:   No  weight loss, night sweats,  No fevers, chills,+fatigue, lassitude. HEENT:   No headaches,  Difficulty swallowing,  Tooth/dental problems,  Sore throat,                No sneezing, itching, ear ache, nasal congestion, post nasal drip,   CV:  Notes left sided  chest pain,  Orthopnea, PND, swelling in lower extremities, anasarca, dizziness, palpitations  GI  No heartburn, indigestion, abdominal pain, nausea, vomiting, diarrhea, change in bowel habits, loss of appetite  Resp: +++ shortness of breath with exertion not at rest.  No excess mucus, no productive cough,  No non-productive cough,  Less  coughing up of blood.  No change in color of mucus.  No wheezing.  No chest wall deformity  Skin: no rash or lesions.  GU: no dysuria, change in color of urine, no urgency or frequency.  No flank pain.  MS:  No joint pain or swelling.  No decreased range of motion.  No back pain.  Psych:  No change in mood or affect. No depression or anxiety.  No memory loss.     Objective:   Physical Exam  Filed Vitals:   02/28/14 0912  BP: 140/78   Pulse: 75  Temp: 98.5 F (36.9 C)  TempSrc: Oral  Height: 5\' 8"  (1.727 m)  Weight: 252 lb (114.306 kg)  SpO2: 95%    Gen: Pleasant, obese, in no distress,  normal affect  ENT: No lesions,  mouth clear,  oropharynx clear, no postnasal drip  Neck: No JVD, no TMG, no carotid bruits  Lungs: No use of accessory muscles, no dullness to percussion, distant bs, clear , no rhonchi   Cardiovascular: RRR, heart sounds normal, no murmur or gallops, no peripheral edema  Abdomen: soft and NT, no HSM,  BS normal insulin pump in place   Musculoskeletal: No deformities, no cyanosis or clubbing  Neuro: alert, non focal  Skin: Warm, no lesions or rashes  No results found.  CT head 01/30/14 Multifocal areas of acute infarction are suspected in the left  hemisphere, consistent with a left MCA territory insult. Proximal  vascular thrombosis versus a shower of emboli could be associated  with these widely separated areas of cortical and subcortical  cytotoxic edema. No mass effect or hemorrhage at this time.   CXR 02/07/14: clearing bilateral LL infiltrates     Assessment & Plan:   BOOP (bronchiolitis obliterans with organizing pneumonia) Boop on OLBx.  Now starting po steroids  for same All micro data neg  Hemoptysis is d/t BOOP and xarelto use CVA off xarelto so will have to maintain Xarelto Plan Pulse prednisone 40mg  daily and slow taper Monitor CBGs  Hemoptysis Slowly resolving d/t BOOP and xarelto use  monitor  Pericardial effusion S/p pericardial window Monitor   GERD (gastroesophageal reflux disease) Severe gerd exacerbating Boop and cough Plan  add pepcid 20mg  qhs to bid PPI  History of stroke S/p CVA off xarelto Now on xarelto with only residual mild aphasia PT completed   Note back on ACE inhibitor Will leave on this high dose ACE inhibitor for now  Updated Medication List Outpatient Encounter Prescriptions as of 02/28/2014  Medication Sig  . albuterol  (PROVENTIL HFA;VENTOLIN HFA) 108 (90 BASE) MCG/ACT inhaler Inhale 2 puffs into the lungs every 6 (six) hours as needed for wheezing or shortness of breath.  Marland Kitchen atorvastatin (LIPITOR) 40 MG tablet Take 40 mg by mouth daily.  Marland Kitchen azelastine (ASTELIN) 137 MCG/SPRAY nasal spray Place 1 spray into both nostrils at bedtime as needed for allergies.   . Calcium Carbonate-Vitamin D (CALTRATE 600+D PO) Take 2 tablets by mouth 2 (two) times daily.   Marland Kitchen diltiazem (CARDIZEM) 30 MG tablet Take 30 mg by mouth 2 (two) times daily.  . fluticasone (FLONASE) 50 MCG/ACT nasal spray Place 1 spray into both nostrils daily as needed for allergies.   . Fluticasone-Salmeterol (ADVAIR) 500-50 MCG/DOSE AEPB Inhale 1 puff into the lungs every 12 (twelve) hours.  . hydrALAZINE (APRESOLINE) 50 MG tablet Take 50 mg by mouth. Take 1 tablet in am and 2 tablets at night by mouth  . Insulin Human (INSULIN PUMP) 100 unit/ml SOLN Inject into the skin continuous. Inject 0.5 units of novolog every hour per insulin pump.  Marland Kitchen ipratropium-albuterol (DUONEB) 0.5-2.5 (3) MG/3ML SOLN Take 3 mLs by nebulization every 6 (six) hours as needed. For shortness of breath  . isosorbide mononitrate (IMDUR) 60 MG 24 hr tablet Take 60 mg by mouth every morning.  . latanoprost (XALATAN) 0.005 % ophthalmic solution Place 1 drop into both eyes at bedtime.  Marland Kitchen lisinopril (PRINIVIL,ZESTRIL) 40 MG tablet Take 40 mg by mouth 2 (two) times daily.  . magnesium oxide (MAG-OX) 400 MG tablet Take 1,600 mg by mouth daily.   . Multiple Vitamins-Minerals (CENTRUM SILVER PO) Take 1 tablet by mouth daily.  . nitroGLYCERIN (NITROSTAT) 0.4 MG SL tablet Place 0.4 mg under the tongue every 5 (five) minutes as needed. For chest pain  . NOVOLOG 100 UNIT/ML injection As directed via insulin pump  . oxyCODONE-acetaminophen (PERCOCET/ROXICET) 5-325 MG per tablet Take 1 tablet by mouth 4 (four) times daily as needed for severe pain. For severe pain  . pantoprazole (PROTONIX) 40 MG  tablet Take 40 mg by mouth 2 (two) times daily.  Vladimir Faster Glycol-Propyl Glycol (SYSTANE PRESERVATIVE FREE) 0.4-0.3 % SOLN Apply 1 drop to eye 4 (four) times daily as needed (dryness of eye).   . potassium chloride SA (K-DUR,KLOR-CON) 20 MEQ tablet Take 20 mEq by mouth 3 (three) times daily.  . pramipexole (MIRAPEX) 1.5 MG tablet Take 1.5 mg by mouth at bedtime.  . pregabalin (LYRICA) 75 MG capsule Take 1 capsule (75 mg total) by mouth 2 (two) times daily.  . rivaroxaban (XARELTO) 20 MG TABS tablet Take 1 tablet (20 mg total) by mouth daily with supper.  . triamcinolone (NASACORT) 55 MCG/ACT nasal inhaler Place 2 sprays into both nostrils daily as needed (allergies).   . zolpidem (AMBIEN) 10 MG  tablet at bedtime as needed.   . famotidine (PEPCID) 20 MG tablet Take 1 tablet (20 mg total) by mouth at bedtime.  . predniSONE (DELTASONE) 10 MG tablet Take 4 for four days 3 for four days then 2 daily  . [DISCONTINUED] losartan (COZAAR) 50 MG tablet Take 50 mg by mouth daily.  . [DISCONTINUED] metolazone (ZAROXOLYN) 2.5 MG tablet Take 2.5 mg by mouth See admin instructions. Patient takes only on Monday Wednesday Friday. Take 30 minutes before furosemide

## 2014-02-28 NOTE — Assessment & Plan Note (Signed)
Slowly resolving d/t BOOP and xarelto use  monitor

## 2014-02-28 NOTE — Assessment & Plan Note (Signed)
Severe gerd exacerbating Boop and cough Plan  add pepcid 20mg  qhs to bid PPI

## 2014-03-08 LAB — AFB CULTURE WITH SMEAR (NOT AT ARMC): Acid Fast Smear: NONE SEEN

## 2014-03-21 ENCOUNTER — Encounter: Payer: Self-pay | Admitting: Critical Care Medicine

## 2014-03-21 ENCOUNTER — Ambulatory Visit (INDEPENDENT_AMBULATORY_CARE_PROVIDER_SITE_OTHER): Payer: Medicare Other | Admitting: Critical Care Medicine

## 2014-03-21 VITALS — BP 162/86 | HR 83 | Temp 99.3°F | Ht 68.0 in | Wt 249.0 lb

## 2014-03-21 DIAGNOSIS — J8409 Other alveolar and parieto-alveolar conditions: Secondary | ICD-10-CM | POA: Diagnosis not present

## 2014-03-21 DIAGNOSIS — Z7901 Long term (current) use of anticoagulants: Secondary | ICD-10-CM | POA: Insufficient documentation

## 2014-03-21 DIAGNOSIS — J8489 Other specified interstitial pulmonary diseases: Secondary | ICD-10-CM

## 2014-03-21 DIAGNOSIS — J449 Chronic obstructive pulmonary disease, unspecified: Secondary | ICD-10-CM

## 2014-03-21 HISTORY — DX: Long term (current) use of anticoagulants: Z79.01

## 2014-03-21 MED ORDER — LEVOFLOXACIN 500 MG PO TABS: 500.0000 mg | ORAL_TABLET | Freq: Every day | ORAL | Status: DC

## 2014-03-21 MED ORDER — RIVAROXABAN 20 MG PO TABS: 20.0000 mg | ORAL_TABLET | Freq: Every day | ORAL | Status: DC

## 2014-03-21 MED ORDER — PREDNISONE 10 MG PO TABS: ORAL_TABLET | ORAL | Status: DC

## 2014-03-21 NOTE — Assessment & Plan Note (Signed)
On xarelto long term for stroke prevention Out of xarelto for one week Refills sent for the pt

## 2014-03-21 NOTE — Assessment & Plan Note (Signed)
Boop with mild flare.  No further hemoptysis. Hemoptysis d/t Boop   Plan Take levaquin one daily for 7days Restart Prednisone 10mg  daily Restart xarelto one daily No other medication changes Return 6 weeks

## 2014-03-21 NOTE — Patient Instructions (Signed)
Take levaquin one daily for 7days Restart Prednisone 10mg  daily Restart xarelto one daily No other medication changes Return 6 weeks

## 2014-03-21 NOTE — Progress Notes (Signed)
Subjective:    Patient ID: Jon [name], male    DOB: [birth year], 63 y.o.   MRN: [id number]  HPI  03/21/2014 Chief Complaint  Patient presents with  . 3 wk follow up    Increased SOB at rest and with exertion - gradually worsening over the past wk and dull pain in left lung with deep inhalations.  No cough or f/c/s.  Has been out of xarelto x 1 wk.   No blood in sputum.  Notices more chest pain. Pt is moving more . More dyspnea with exertion and at rest.  Pt had to move into a new house.  No real cough.  Notes more dyspnea.  Last abx Rx helped , out of pred for two weeks.   Review of Systems  Constitutional:   No  weight loss, night sweats,  No fevers, chills,+fatigue, lassitude. HEENT:   No headaches,  Difficulty swallowing,  Tooth/dental problems,  Sore throat,                No sneezing, itching, ear ache, nasal congestion, post nasal drip,   CV:  Notes left sided  chest pain,  Orthopnea, PND, swelling in lower extremities, anasarca, dizziness, palpitations  GI  No heartburn, indigestion, abdominal pain, nausea, vomiting, diarrhea, change in bowel habits, loss of appetite  Resp: +++ shortness of breath with exertion not at rest.  No excess mucus, no productive cough,  No non-productive cough,  Less  coughing up of blood.  No change in color of mucus.  No wheezing.  No chest wall deformity  Skin: no rash or lesions.  GU: no dysuria, change in color of urine, no urgency or frequency.  No flank pain.  MS:  No joint pain or swelling.  No decreased range of motion.  No back pain.  Psych:  No change in mood or affect. No depression or anxiety.  No memory loss.     Objective:   Physical Exam  Filed Vitals:   03/21/14 1050  BP: 162/86  Pulse: 83  Temp: 99.3 F (37.4 C)  TempSrc: Oral  Height: 5\' 8"  (1.727 m)  Weight: 249 lb (112.946 kg)  SpO2: 97%    Gen: Pleasant, obese, in no distress,  normal affect  ENT: No lesions,  mouth clear,  oropharynx clear, no postnasal  drip  Neck: No JVD, no TMG, no carotid bruits  Lungs: No use of accessory muscles, no dullness to percussion, distant bs, clear , no rhonchi   Cardiovascular: RRR, heart sounds normal, no murmur or gallops, no peripheral edema  Abdomen: soft and NT, no HSM,  BS normal insulin pump in place   Musculoskeletal: No deformities, no cyanosis or clubbing  Neuro: alert, non focal  Skin: Warm, no lesions or rashes  No results found.     Assessment & Plan:   BOOP (bronchiolitis obliterans with organizing pneumonia) Boop with mild flare.  No further hemoptysis. Hemoptysis d/t Boop   Plan Take levaquin one daily for 7days Restart Prednisone 10mg  daily Restart xarelto one daily No other medication changes Return 6 weeks   Long term (current) use of anticoagulants On xarelto long term for stroke prevention Out of xarelto for one week Refills sent for the pt    Note back on ACE inhibitor Will leave on this high dose ACE inhibitor for now  Updated Medication List Outpatient Encounter Prescriptions as of 03/21/2014  Medication Sig  . albuterol (PROVENTIL HFA;VENTOLIN HFA) 108 (90 BASE) MCG/ACT inhaler Inhale 2  puffs into the lungs every 6 (six) hours as needed for wheezing or shortness of breath.  Marland Kitchen albuterol (PROVENTIL) (2.5 MG/3ML) 0.083% nebulizer solution Take 2.5 mg by nebulization 2 (two) times daily. And as needed  . atorvastatin (LIPITOR) 40 MG tablet Take 40 mg by mouth daily.  Marland Kitchen azelastine (ASTELIN) 137 MCG/SPRAY nasal spray Place 1 spray into both nostrils at bedtime as needed for allergies.   Marland Kitchen diltiazem (CARDIZEM) 30 MG tablet Take 30 mg by mouth 2 (two) times daily.  . famotidine (PEPCID) 20 MG tablet Take 1 tablet (20 mg total) by mouth at bedtime.  . fentaNYL (DURAGESIC - DOSED MCG/HR) 100 MCG/HR as needed.  . fluticasone (FLONASE) 50 MCG/ACT nasal spray Place 1 spray into both nostrils daily as needed for allergies.   . Fluticasone-Salmeterol (ADVAIR) 500-50  MCG/DOSE AEPB Inhale 1 puff into the lungs every 12 (twelve) hours.  . hydrALAZINE (APRESOLINE) 50 MG tablet Take 50 mg by mouth. Take 1 tablet in am and 2 tablets at night by mouth  . Insulin Human (INSULIN PUMP) 100 unit/ml SOLN Inject into the skin continuous. Inject 0.5 units of novolog every hour per insulin pump.  Marland Kitchen ipratropium (ATROVENT) 0.02 % nebulizer solution Take 0.5 mg by nebulization 4 (four) times daily as needed for wheezing or shortness of breath.  . isosorbide mononitrate (IMDUR) 60 MG 24 hr tablet Take 60 mg by mouth every morning.  . latanoprost (XALATAN) 0.005 % ophthalmic solution Place 1 drop into both eyes at bedtime.  Marland Kitchen lisinopril (PRINIVIL,ZESTRIL) 40 MG tablet Take 40 mg by mouth 2 (two) times daily.  . magnesium oxide (MAG-OX) 400 MG tablet Take 1,600 mg by mouth daily.   . metolazone (ZAROXOLYN) 2.5 MG tablet as needed.  . Multiple Vitamins-Minerals (CENTRUM SILVER PO) Take 1 tablet by mouth daily.  . nitroGLYCERIN (NITROSTAT) 0.4 MG SL tablet Place 0.4 mg under the tongue every 5 (five) minutes as needed. For chest pain  . NOVOLOG 100 UNIT/ML injection As directed via insulin pump  . oxyCODONE-acetaminophen (PERCOCET/ROXICET) 5-325 MG per tablet Take 1 tablet by mouth 4 (four) times daily as needed for severe pain. For severe pain  . pantoprazole (PROTONIX) 40 MG tablet Take 40 mg by mouth 2 (two) times daily.  Vladimir Faster Glycol-Propyl Glycol (SYSTANE PRESERVATIVE FREE) 0.4-0.3 % SOLN Apply 1 drop to eye 4 (four) times daily as needed (dryness of eye).   . potassium chloride SA (K-DUR,KLOR-CON) 20 MEQ tablet Take 20 mEq by mouth 3 (three) times daily.  . pramipexole (MIRAPEX) 1.5 MG tablet Take 1.5 mg by mouth at bedtime.  . pregabalin (LYRICA) 75 MG capsule Take 1 capsule (75 mg total) by mouth 2 (two) times daily.  Marland Kitchen triamcinolone (NASACORT) 55 MCG/ACT nasal inhaler Place 2 sprays into both nostrils daily as needed (allergies).   . zolpidem (AMBIEN) 10 MG tablet at  bedtime as needed.   Marland Kitchen levofloxacin (LEVAQUIN) 500 MG tablet Take 1 tablet (500 mg total) by mouth daily.  . predniSONE (DELTASONE) 10 MG tablet Take one daily  . rivaroxaban (XARELTO) 20 MG TABS tablet Take 1 tablet (20 mg total) by mouth daily with supper.  . [DISCONTINUED] Calcium Carbonate-Vitamin D (CALTRATE 600+D PO) Take 2 tablets by mouth 2 (two) times daily.   . [DISCONTINUED] ipratropium-albuterol (DUONEB) 0.5-2.5 (3) MG/3ML SOLN Take 3 mLs by nebulization every 6 (six) hours as needed. For shortness of breath  . [DISCONTINUED] predniSONE (DELTASONE) 10 MG tablet Take 4 for four days 3 for four  days then 2 daily  . [DISCONTINUED] rivaroxaban (XARELTO) 20 MG TABS tablet Take 1 tablet (20 mg total) by mouth daily with supper.

## 2014-03-22 DIAGNOSIS — E1149 Type 2 diabetes mellitus with other diabetic neurological complication: Secondary | ICD-10-CM | POA: Diagnosis not present

## 2014-03-22 DIAGNOSIS — G4733 Obstructive sleep apnea (adult) (pediatric): Secondary | ICD-10-CM | POA: Diagnosis not present

## 2014-03-22 DIAGNOSIS — E782 Mixed hyperlipidemia: Secondary | ICD-10-CM | POA: Diagnosis not present

## 2014-03-22 DIAGNOSIS — K5901 Slow transit constipation: Secondary | ICD-10-CM | POA: Diagnosis not present

## 2014-03-22 DIAGNOSIS — I10 Essential (primary) hypertension: Secondary | ICD-10-CM | POA: Diagnosis not present

## 2014-03-22 DIAGNOSIS — E7800 Pure hypercholesterolemia, unspecified: Secondary | ICD-10-CM | POA: Diagnosis not present

## 2014-03-22 DIAGNOSIS — J449 Chronic obstructive pulmonary disease, unspecified: Secondary | ICD-10-CM | POA: Diagnosis not present

## 2014-03-22 DIAGNOSIS — I4891 Unspecified atrial fibrillation: Secondary | ICD-10-CM | POA: Diagnosis not present

## 2014-03-22 DIAGNOSIS — M1710 Unilateral primary osteoarthritis, unspecified knee: Secondary | ICD-10-CM | POA: Diagnosis not present

## 2014-05-02 ENCOUNTER — Ambulatory Visit: Payer: Medicare Other | Admitting: Critical Care Medicine

## 2014-05-18 ENCOUNTER — Ambulatory Visit: Payer: Medicare Other | Admitting: Internal Medicine

## 2014-05-31 ENCOUNTER — Encounter: Payer: Self-pay | Admitting: Internal Medicine

## 2014-06-09 DIAGNOSIS — Z23 Encounter for immunization: Secondary | ICD-10-CM | POA: Diagnosis not present

## 2014-07-10 DIAGNOSIS — E780 Pure hypercholesterolemia: Secondary | ICD-10-CM | POA: Diagnosis not present

## 2014-07-10 DIAGNOSIS — I482 Chronic atrial fibrillation: Secondary | ICD-10-CM | POA: Diagnosis not present

## 2014-07-10 DIAGNOSIS — E1122 Type 2 diabetes mellitus with diabetic chronic kidney disease: Secondary | ICD-10-CM | POA: Diagnosis not present

## 2014-07-10 DIAGNOSIS — I110 Hypertensive heart disease with heart failure: Secondary | ICD-10-CM | POA: Diagnosis not present

## 2014-07-10 DIAGNOSIS — K5901 Slow transit constipation: Secondary | ICD-10-CM | POA: Diagnosis not present

## 2014-07-10 DIAGNOSIS — K219 Gastro-esophageal reflux disease without esophagitis: Secondary | ICD-10-CM | POA: Diagnosis not present

## 2014-07-10 DIAGNOSIS — M170 Bilateral primary osteoarthritis of knee: Secondary | ICD-10-CM | POA: Diagnosis not present

## 2014-07-10 DIAGNOSIS — E782 Mixed hyperlipidemia: Secondary | ICD-10-CM | POA: Diagnosis not present

## 2014-07-10 DIAGNOSIS — J449 Chronic obstructive pulmonary disease, unspecified: Secondary | ICD-10-CM | POA: Diagnosis not present

## 2014-07-10 DIAGNOSIS — E1121 Type 2 diabetes mellitus with diabetic nephropathy: Secondary | ICD-10-CM | POA: Diagnosis not present

## 2014-07-10 DIAGNOSIS — G4733 Obstructive sleep apnea (adult) (pediatric): Secondary | ICD-10-CM | POA: Diagnosis not present

## 2014-07-17 ENCOUNTER — Telehealth: Payer: Self-pay | Admitting: Critical Care Medicine

## 2014-07-17 NOTE — Telephone Encounter (Signed)
Called and spoke to pt. Pt c/o pain in his lungs with inhale and exhale, mild dry cough x 1-2 weeks. Pt denies swelling and f/c/s. Pt requesting appt this week. Appt made with TP on 11/20 at 4:30. Pt verbalized understanding and is aware to seek emergency care if s/s worsen.

## 2014-07-18 DIAGNOSIS — R202 Paresthesia of skin: Secondary | ICD-10-CM | POA: Diagnosis not present

## 2014-07-21 ENCOUNTER — Encounter: Payer: Self-pay | Admitting: Adult Health

## 2014-07-21 ENCOUNTER — Telehealth: Payer: Self-pay | Admitting: Internal Medicine

## 2014-07-21 ENCOUNTER — Ambulatory Visit (INDEPENDENT_AMBULATORY_CARE_PROVIDER_SITE_OTHER)
Admission: RE | Admit: 2014-07-21 | Discharge: 2014-07-21 | Disposition: A | Discharge status: Home / Self Care | Payer: Medicare Other | Source: Ambulatory Visit | Attending: Adult Health | Admitting: Adult Health

## 2014-07-21 ENCOUNTER — Ambulatory Visit (INDEPENDENT_AMBULATORY_CARE_PROVIDER_SITE_OTHER): Payer: Medicare Other | Admitting: Adult Health

## 2014-07-21 VITALS — BP 110/80 | HR 90 | Temp 97.0°F | Ht 67.0 in | Wt 247.2 lb

## 2014-07-21 DIAGNOSIS — J90 Pleural effusion, not elsewhere classified: Secondary | ICD-10-CM | POA: Diagnosis not present

## 2014-07-21 DIAGNOSIS — J9811 Atelectasis: Secondary | ICD-10-CM | POA: Diagnosis not present

## 2014-07-21 DIAGNOSIS — R079 Chest pain, unspecified: Secondary | ICD-10-CM | POA: Diagnosis not present

## 2014-07-21 DIAGNOSIS — J449 Chronic obstructive pulmonary disease, unspecified: Secondary | ICD-10-CM

## 2014-07-21 DIAGNOSIS — I639 Cerebral infarction, unspecified: Secondary | ICD-10-CM | POA: Diagnosis not present

## 2014-07-21 MED ORDER — PREDNISONE 10 MG PO TABS: ORAL_TABLET | ORAL | Status: DC

## 2014-07-21 NOTE — Patient Instructions (Addendum)
Increase Prednisone 20mg  daily for 1 week then back to 10mg  daily  Warm heat to ribs  Chest xray today .  Follow up Dr. [name]  In 4-6 weeks and As needed   Please contact office for sooner follow up if symptoms do not improve or worsen or seek emergency care    Late Add  CXR w/ increased consolidation/ATX in LLL  Levaquin 500mg  daily sent to pharm x 7 days OV in 3 weeks with cxr  Please contact office for sooner follow up if symptoms do not improve or worsen or seek emergency care

## 2014-07-24 MED ORDER — LEVOFLOXACIN 500 MG PO TABS: 500.0000 mg | ORAL_TABLET | Freq: Every day | ORAL | Status: AC

## 2014-07-24 NOTE — Assessment & Plan Note (Signed)
Mild flare w/ Pleurisy  Check cxr   Plan  Increase Prednisone 20mg  daily for 1 week then back to 10mg  daily  Warm heat to ribs  Chest xray today .  Follow up Dr. [name]  In 4-6 weeks and As needed   Please contact office for sooner follow up if symptoms do not improve or worsen or seek emergency care

## 2014-07-24 NOTE — Progress Notes (Signed)
   Subjective:    Patient ID: Jon [name], male    DOB: May 30, 1951, 63 y.o.   MRN: [id number]  HPI 62 yo male former smoker  with COPD , BOOP , and OSA on nocturnal CPAP/O2  Has DM on insulin pump, and PVD w/ prev CVA   07/21/14 Acute OV  Complains of pain with cough for past several weeks. He has some chest tightness but no wheezing. Patient reports no mucus with cough.  Tender and sore along ribs on right  , hurts to turn or raise arm/shoulder on right.  No fever, hemoptysis, chest pain , orthopnea, or edema.  Remains on Prednisone 10mg  daily  On Advair Twice daily  , Uses Duoneb Four times a day   On ACE  Inhibitor .    Review of Systems Constitutional:   No  weight loss, night sweats,  Fevers, chills,  +fatigue, or  lassitude.  HEENT:   No headaches,  Difficulty swallowing,  Tooth/dental problems, or  Sore throat,                No sneezing, itching, ear ache,  +nasal congestion, post nasal drip,   CV:  No chest pain,  Orthopnea, PND, swelling in lower extremities, anasarca, dizziness, palpitations, syncope.   GI  No heartburn, indigestion, abdominal pain, nausea, vomiting, diarrhea, change in bowel habits, loss of appetite, bloody stools.   Resp:   No chest wall deformity  Skin: no rash or lesions.  GU: no dysuria, change in color of urine, no urgency or frequency.  No flank pain, no hematuria   MS:  No joint pain or swelling.  No decreased range of motion.  No back pain.  Psych:  No change in mood or affect. No depression or anxiety.  No memory loss.         Objective:   Physical Exam GEN: A/Ox3; pleasant , NAD, chronically ill appearing   HEENT:  [place]/AT,  EACs-clear, TMs-wnl, NOSE-clear drainage  THROAT-clear, no lesions, no postnasal drip or exudate noted.   NECK:  Supple w/ fair ROM; no JVD; normal carotid impulses w/o bruits; no thyromegaly or nodules palpated; no lymphadenopathy.  RESP  Diminished BS in bases , no wheezing , tenderness along bilateral  ribs, no deformity or ecchymosis noted. no accessory muscle use, no dullness to percussion  CARD:  RRR, no m/r/g  , tr  peripheral edema, pulses intact, no cyanosis or clubbing.  GI:   Soft & nt; nml bowel sounds; no organomegaly or masses detected.  Musco: Warm bil, no deformities or joint swelling noted.   Neuro: alert, no focal deficits noted.    Skin: Warm, no lesions or rashes   CXR 07/21/14   There is an area of increased consolidation at the left lung base posterior medially with increased atelectasis at the left base laterally. The right lung is clear. Heart size and vascularity are normal. There is a small left effusion.       Assessment & Plan:

## 2014-07-25 NOTE — Progress Notes (Signed)
Quick Note:  PW's first available is 12.21.15, about 4 weeks. This is okay per TP. Called spoke with patient and scheduled appt w/ PW on 12.21.15 @ 0945 Nothing further needed ______

## 2014-07-25 NOTE — Telephone Encounter (Signed)
Close encounter 

## 2014-08-07 DIAGNOSIS — G5602 Carpal tunnel syndrome, left upper limb: Secondary | ICD-10-CM | POA: Diagnosis not present

## 2014-08-08 DIAGNOSIS — K5901 Slow transit constipation: Secondary | ICD-10-CM | POA: Diagnosis not present

## 2014-08-08 DIAGNOSIS — M170 Bilateral primary osteoarthritis of knee: Secondary | ICD-10-CM | POA: Diagnosis not present

## 2014-08-08 DIAGNOSIS — E1142 Type 2 diabetes mellitus with diabetic polyneuropathy: Secondary | ICD-10-CM | POA: Diagnosis not present

## 2014-08-08 DIAGNOSIS — G5600 Carpal tunnel syndrome, unspecified upper limb: Secondary | ICD-10-CM | POA: Diagnosis not present

## 2014-08-08 DIAGNOSIS — K219 Gastro-esophageal reflux disease without esophagitis: Secondary | ICD-10-CM | POA: Diagnosis not present

## 2014-08-14 ENCOUNTER — Telehealth: Payer: Self-pay | Admitting: Internal Medicine

## 2014-08-15 NOTE — Telephone Encounter (Signed)
Close encounter 

## 2014-08-21 ENCOUNTER — Ambulatory Visit: Payer: Medicare Other | Admitting: Critical Care Medicine

## 2014-08-21 ENCOUNTER — Ambulatory Visit: Payer: Medicare Other | Admitting: Internal Medicine

## 2014-09-20 ENCOUNTER — Ambulatory Visit: Payer: Medicare Other | Admitting: Critical Care Medicine

## 2014-09-20 ENCOUNTER — Telehealth: Payer: Self-pay | Admitting: Internal Medicine

## 2014-09-20 NOTE — Telephone Encounter (Signed)
Close encounter 

## 2014-09-25 ENCOUNTER — Ambulatory Visit: Payer: Medicare Other | Admitting: Internal Medicine

## 2014-10-04 DIAGNOSIS — G5602 Carpal tunnel syndrome, left upper limb: Secondary | ICD-10-CM | POA: Diagnosis not present

## 2014-10-06 DIAGNOSIS — I482 Chronic atrial fibrillation: Secondary | ICD-10-CM | POA: Diagnosis not present

## 2014-10-06 DIAGNOSIS — N189 Chronic kidney disease, unspecified: Secondary | ICD-10-CM | POA: Diagnosis not present

## 2014-10-06 DIAGNOSIS — G5602 Carpal tunnel syndrome, left upper limb: Secondary | ICD-10-CM | POA: Diagnosis not present

## 2014-10-06 DIAGNOSIS — J449 Chronic obstructive pulmonary disease, unspecified: Secondary | ICD-10-CM | POA: Diagnosis not present

## 2014-10-06 DIAGNOSIS — I447 Left bundle-branch block, unspecified: Secondary | ICD-10-CM | POA: Diagnosis not present

## 2014-10-06 DIAGNOSIS — I5032 Chronic diastolic (congestive) heart failure: Secondary | ICD-10-CM | POA: Diagnosis not present

## 2014-10-06 DIAGNOSIS — I313 Pericardial effusion (noninflammatory): Secondary | ICD-10-CM | POA: Diagnosis not present

## 2014-10-06 DIAGNOSIS — R0602 Shortness of breath: Secondary | ICD-10-CM | POA: Diagnosis not present

## 2014-10-09 DIAGNOSIS — E1121 Type 2 diabetes mellitus with diabetic nephropathy: Secondary | ICD-10-CM | POA: Diagnosis not present

## 2014-10-09 DIAGNOSIS — Z5181 Encounter for therapeutic drug level monitoring: Secondary | ICD-10-CM | POA: Diagnosis not present

## 2014-10-09 DIAGNOSIS — L600 Ingrowing nail: Secondary | ICD-10-CM | POA: Diagnosis not present

## 2014-10-09 DIAGNOSIS — G5600 Carpal tunnel syndrome, unspecified upper limb: Secondary | ICD-10-CM | POA: Diagnosis not present

## 2014-10-09 DIAGNOSIS — E782 Mixed hyperlipidemia: Secondary | ICD-10-CM | POA: Diagnosis not present

## 2014-10-09 DIAGNOSIS — K219 Gastro-esophageal reflux disease without esophagitis: Secondary | ICD-10-CM | POA: Diagnosis not present

## 2014-10-09 DIAGNOSIS — E1142 Type 2 diabetes mellitus with diabetic polyneuropathy: Secondary | ICD-10-CM | POA: Diagnosis not present

## 2014-10-09 DIAGNOSIS — G4733 Obstructive sleep apnea (adult) (pediatric): Secondary | ICD-10-CM | POA: Diagnosis not present

## 2014-10-09 DIAGNOSIS — M170 Bilateral primary osteoarthritis of knee: Secondary | ICD-10-CM | POA: Diagnosis not present

## 2014-10-09 DIAGNOSIS — J0100 Acute maxillary sinusitis, unspecified: Secondary | ICD-10-CM | POA: Diagnosis not present

## 2014-10-09 DIAGNOSIS — Z79899 Other long term (current) drug therapy: Secondary | ICD-10-CM | POA: Diagnosis not present

## 2014-10-09 DIAGNOSIS — J449 Chronic obstructive pulmonary disease, unspecified: Secondary | ICD-10-CM | POA: Diagnosis not present

## 2014-10-09 DIAGNOSIS — I10 Essential (primary) hypertension: Secondary | ICD-10-CM | POA: Diagnosis not present

## 2014-10-09 DIAGNOSIS — I482 Chronic atrial fibrillation: Secondary | ICD-10-CM | POA: Diagnosis not present

## 2014-10-10 ENCOUNTER — Telehealth: Payer: Self-pay | Admitting: *Deleted

## 2014-10-10 ENCOUNTER — Encounter: Payer: Self-pay | Admitting: Critical Care Medicine

## 2014-10-10 ENCOUNTER — Ambulatory Visit (INDEPENDENT_AMBULATORY_CARE_PROVIDER_SITE_OTHER): Payer: Medicare Other | Admitting: Critical Care Medicine

## 2014-10-10 ENCOUNTER — Ambulatory Visit: Payer: Medicare Other | Admitting: Internal Medicine

## 2014-10-10 VITALS — BP 120/64 | HR 88 | Temp 97.8°F | Ht 67.0 in | Wt 253.0 lb

## 2014-10-10 DIAGNOSIS — J8489 Other specified interstitial pulmonary diseases: Secondary | ICD-10-CM

## 2014-10-10 DIAGNOSIS — J189 Pneumonia, unspecified organism: Secondary | ICD-10-CM

## 2014-10-10 MED ORDER — FLUTICASONE-SALMETEROL 500-50 MCG/DOSE IN AEPB: 1.0000 | INHALATION_SPRAY | Freq: Two times a day (BID) | RESPIRATORY_TRACT | Status: DC

## 2014-10-10 MED ORDER — PREDNISONE 10 MG PO TABS: ORAL_TABLET | ORAL | Status: DC

## 2014-10-10 NOTE — Telephone Encounter (Signed)
Prednisone increased to 20 mg qd during OV today.  Pt has insulin pump.  PCP will need to be made aware of this change. I spoke with Collie Siad with Dr. Alyse Low office. Advised of prednisone increase to 20 mg qd.  She will inform Dr. Tobie Poet.  Nothing further needed.

## 2014-10-10 NOTE — Patient Instructions (Signed)
Increase prednisone 20mg  daily We will let Dr Tobie Poet office know of change Refills on prednisone and advair sent Return 4 months

## 2014-10-10 NOTE — Progress Notes (Signed)
Subjective:    Patient ID: Jon [name], male    DOB: 03-Sep-1950, 64 y.o.   MRN: [id number]  HPI 64 yo male former smoker  with COPD , BOOP , and OSA on nocturnal CPAP/O2  Has DM on insulin pump, and PVD w/ prev CVA   10/10/2014 Chief Complaint  Patient presents with  . Follow-up    c/o sob good and bad days,left side pain comes and goes x 1 wk.,keeping awake,occass. cough-yellow,wheezing,no fcs.Still wearing oxygen 2L with CPAP.  Seen 07/2014 Rx pred pulse. cxr abn Left base.  Pt was improved with this.  Pt still has pain in LLL area.  Pt with chest pain.  No hemoptysis.  Mucus when comes up is yellow to brown.  Pain is dull ache, this will awaken pt at night.  No f/c/s . Maintains Xarelto   Review of Systems Constitutional:   No  weight loss, night sweats,  Fevers, chills,  +fatigue, or  lassitude.  HEENT:   No headaches,  Difficulty swallowing,  Tooth/dental problems, or  Sore throat,                No sneezing, itching, ear ache,  +nasal congestion, post nasal drip,   CV:  Notes  chest pain,  Orthopnea, PND, swelling in lower extremities, anasarca, dizziness, palpitations, syncope.   GI  No heartburn, indigestion, abdominal pain, nausea, vomiting, diarrhea, change in bowel habits, loss of appetite, bloody stools.   Resp:   No chest wall deformity  Skin: no rash or lesions.  GU: no dysuria, change in color of urine, no urgency or frequency.  No flank pain, no hematuria   MS:  No joint pain or swelling.  No decreased range of motion.  No back pain.  Psych:  No change in mood or affect. No depression or anxiety.  No memory loss.         Objective:   Physical Exam BP 120/64 mmHg  Pulse 88  Temp(Src) 97.8 F (36.6 C) (Oral)  Ht 5\' 7"  (1.702 m)  Wt 253 lb (114.76 kg)  BMI 39.62 kg/m2  SpO2 96%  GEN: A/Ox3; pleasant , NAD, chronically ill appearing   HEENT:  [place]/AT,  EACs-clear, TMs-wnl, NOSE-clear drainage  THROAT-clear, no lesions, no postnasal drip or exudate  noted.   NECK:  Supple w/ fair ROM; no JVD; normal carotid impulses w/o bruits; no thyromegaly or nodules palpated; no lymphadenopathy.  RESP  Diminished BS in bases , no wheezing , tenderness along bilateral ribs, no deformity or ecchymosis noted. no accessory muscle use, no dullness to percussion  CARD:  RRR, no m/r/g  , tr  peripheral edema, pulses intact, no cyanosis or clubbing.  GI:   Soft & nt; nml bowel sounds; no organomegaly or masses detected.  Musco: Warm bil, no deformities or joint swelling noted.   Neuro: alert, no focal deficits noted.    Skin: Warm, no lesions or rashes   CXR 07/21/14   There is an area of increased consolidation at the left lung base posterior medially with increased atelectasis at the left base laterally. The right lung is clear. Heart size and vascularity are normal. There is a small left effusion.       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 albuterol (PROVENTIL HFA;VENTOLIN HFA) 108 (90 BASE) MCG/ACT inhaler Inhale 2 puffs into the lungs every 6 (six) hours as needed for wheezing or shortness of breath.  Marland Kitchen albuterol (PROVENTIL) (2.5 MG/3ML) 0.083% nebulizer solution Take 2.5 mg by nebulization 2 (two) times daily. And as needed  . atorvastatin (LIPITOR) 40 MG tablet Take 40 mg by mouth daily.  Marland Kitchen azelastine (ASTELIN) 137 MCG/SPRAY nasal spray Place 1 spray into both nostrils at bedtime as needed for allergies.   Marland Kitchen dexlansoprazole (DEXILANT) 60 MG capsule Take 60 mg by mouth daily.  Marland Kitchen  diltiazem (CARDIZEM) 30 MG tablet Take 30 mg by mouth 2 (two) times daily.  . famotidine (PEPCID) 20 MG tablet Take 1 tablet (20 mg total) by mouth at bedtime.  . fluticasone (FLONASE) 50 MCG/ACT nasal spray Place 1 spray into both nostrils daily as needed for allergies.   . Fluticasone-Salmeterol (ADVAIR) 500-50 MCG/DOSE AEPB Inhale 1 puff into the lungs every 12 (twelve) hours.  . hydrALAZINE (APRESOLINE) 50 MG tablet Take 50 mg by mouth. Take 1 tablet in am and 2 tablets at night by mouth  . Insulin Human (INSULIN PUMP) 100 unit/ml SOLN Inject into the skin continuous. Inject 0.5 units of novolog every hour per insulin pump.  Marland Kitchen ipratropium (ATROVENT) 0.02 % nebulizer solution Take 0.5 mg by nebulization 4 (four) times daily as needed for wheezing or shortness of breath.  . isosorbide mononitrate (IMDUR) 60 MG 24 hr tablet Take 60 mg by mouth every morning.  . latanoprost (XALATAN) 0.005 % ophthalmic solution Place 1 drop into both eyes at bedtime.  Marland Kitchen lisinopril (PRINIVIL,ZESTRIL) 40 MG tablet Take 40 mg by mouth 2 (two) times daily.  . magnesium oxide (MAG-OX) 400 MG tablet Take 1,600 mg by mouth daily.   . metolazone (ZAROXOLYN) 2.5 MG tablet as needed.  Marland Kitchen MOVANTIK 25 MG TABS 25 mg. Take 1 tablet by mouth daily  . Multiple Vitamins-Minerals (CENTRUM SILVER PO) Take 1 tablet by mouth daily.  . nitroGLYCERIN (NITROSTAT) 0.4 MG SL tablet Place 0.4 mg under the tongue every 5 (five) minutes as needed. For chest pain  . NOVOLOG 100 UNIT/ML injection As directed via insulin pump  . OXYCONTIN 10 MG T12A 12 hr tablet Take 10 mg by mouth 2 (two) times daily.  Vladimir Faster Glycol-Propyl Glycol (SYSTANE PRESERVATIVE FREE) 0.4-0.3 % SOLN Apply 1 drop to eye 4 (four) times daily as needed (dryness of eye).   . potassium chloride SA (K-DUR,KLOR-CON) 20 MEQ tablet Take 20 mEq by mouth 3 (three) times daily.  . pramipexole (MIRAPEX) 1.5 MG tablet Take 1.5 mg by mouth at bedtime.  . predniSONE (DELTASONE) 10  MG tablet Take two daily  . pregabalin (LYRICA) 75 MG capsule Take 1 capsule (75 mg total) by mouth 2 (two) times daily.  . rivaroxaban (XARELTO) 20 MG TABS tablet Take 1 tablet (20 mg total) by mouth daily with supper.  . torsemide (DEMADEX) 20 MG tablet Take 20 mg by mouth 2 (two) times daily.  Marland Kitchen triamcinolone (NASACORT) 55 MCG/ACT nasal inhaler Place 2 sprays into both nostrils daily as needed (allergies).   . zolpidem (AMBIEN) 10 MG tablet at bedtime as needed.   . [DISCONTINUED] Fluticasone-Salmeterol (ADVAIR) 500-50 MCG/DOSE AEPB Inhale 1 puff into the lungs every 12 (twelve) hours.  . [DISCONTINUED] predniSONE (  DELTASONE) 10 MG tablet Take one daily  . ciprofloxacin (CIPRO) 500 MG tablet Take as directed  . [DISCONTINUED] fentaNYL (DURAGESIC - DOSED MCG/HR) 100 MCG/HR as needed.  . [DISCONTINUED] pantoprazole (PROTONIX) 40 MG tablet Take 40 mg by mouth 2 (two) times daily.  . [DISCONTINUED] predniSONE (DELTASONE) 10 MG tablet 2 tabs daily for 7 days then back to 1 daily (Patient not taking: Reported on 10/10/2014)

## 2014-10-10 NOTE — Assessment & Plan Note (Signed)
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

## 2014-10-12 DIAGNOSIS — E1165 Type 2 diabetes mellitus with hyperglycemia: Secondary | ICD-10-CM | POA: Diagnosis not present

## 2014-10-12 DIAGNOSIS — E118 Type 2 diabetes mellitus with unspecified complications: Secondary | ICD-10-CM | POA: Diagnosis not present

## 2014-10-12 DIAGNOSIS — Z4651 Encounter for fitting and adjustment of gastric lap band: Secondary | ICD-10-CM | POA: Diagnosis not present

## 2014-10-12 DIAGNOSIS — L600 Ingrowing nail: Secondary | ICD-10-CM | POA: Diagnosis not present

## 2014-10-12 DIAGNOSIS — Z6841 Body Mass Index (BMI) 40.0 and over, adult: Secondary | ICD-10-CM | POA: Diagnosis not present

## 2014-10-26 DIAGNOSIS — L600 Ingrowing nail: Secondary | ICD-10-CM | POA: Diagnosis not present

## 2014-10-30 DIAGNOSIS — M1711 Unilateral primary osteoarthritis, right knee: Secondary | ICD-10-CM | POA: Diagnosis not present

## 2014-10-31 DIAGNOSIS — E1142 Type 2 diabetes mellitus with diabetic polyneuropathy: Secondary | ICD-10-CM | POA: Diagnosis not present

## 2014-10-31 DIAGNOSIS — Z4681 Encounter for fitting and adjustment of insulin pump: Secondary | ICD-10-CM | POA: Diagnosis not present

## 2014-11-13 DIAGNOSIS — J0100 Acute maxillary sinusitis, unspecified: Secondary | ICD-10-CM | POA: Diagnosis not present

## 2014-11-13 DIAGNOSIS — J069 Acute upper respiratory infection, unspecified: Secondary | ICD-10-CM | POA: Diagnosis not present

## 2014-11-13 DIAGNOSIS — E1121 Type 2 diabetes mellitus with diabetic nephropathy: Secondary | ICD-10-CM | POA: Diagnosis not present

## 2014-11-13 DIAGNOSIS — M170 Bilateral primary osteoarthritis of knee: Secondary | ICD-10-CM | POA: Diagnosis not present

## 2014-11-14 DIAGNOSIS — Z4681 Encounter for fitting and adjustment of insulin pump: Secondary | ICD-10-CM | POA: Diagnosis not present

## 2014-11-14 DIAGNOSIS — E1121 Type 2 diabetes mellitus with diabetic nephropathy: Secondary | ICD-10-CM | POA: Diagnosis not present

## 2014-11-28 DIAGNOSIS — J189 Pneumonia, unspecified organism: Secondary | ICD-10-CM | POA: Diagnosis not present

## 2014-11-29 ENCOUNTER — Emergency Department (HOSPITAL_COMMUNITY): Payer: Medicare Other

## 2014-11-29 ENCOUNTER — Encounter (HOSPITAL_COMMUNITY): Payer: Self-pay | Admitting: Emergency Medicine

## 2014-11-29 ENCOUNTER — Emergency Department (HOSPITAL_COMMUNITY)
Admission: EM | Admit: 2014-11-29 | Discharge: 2014-11-29 | Disposition: A | Discharge status: Home / Self Care | Payer: Medicare Other | Attending: Emergency Medicine | Admitting: Emergency Medicine

## 2014-11-29 DIAGNOSIS — I447 Left bundle-branch block, unspecified: Secondary | ICD-10-CM

## 2014-11-29 DIAGNOSIS — Z9641 Presence of insulin pump (external) (internal): Secondary | ICD-10-CM | POA: Diagnosis not present

## 2014-11-29 DIAGNOSIS — R509 Fever, unspecified: Secondary | ICD-10-CM | POA: Diagnosis not present

## 2014-11-29 DIAGNOSIS — M170 Bilateral primary osteoarthritis of knee: Secondary | ICD-10-CM | POA: Insufficient documentation

## 2014-11-29 DIAGNOSIS — I4891 Unspecified atrial fibrillation: Secondary | ICD-10-CM | POA: Diagnosis present

## 2014-11-29 DIAGNOSIS — J189 Pneumonia, unspecified organism: Secondary | ICD-10-CM | POA: Diagnosis present

## 2014-11-29 DIAGNOSIS — R05 Cough: Secondary | ICD-10-CM | POA: Diagnosis not present

## 2014-11-29 DIAGNOSIS — Z7952 Long term (current) use of systemic steroids: Secondary | ICD-10-CM | POA: Diagnosis not present

## 2014-11-29 DIAGNOSIS — I110 Hypertensive heart disease with heart failure: Secondary | ICD-10-CM | POA: Diagnosis present

## 2014-11-29 DIAGNOSIS — G4733 Obstructive sleep apnea (adult) (pediatric): Secondary | ICD-10-CM | POA: Insufficient documentation

## 2014-11-29 DIAGNOSIS — E1165 Type 2 diabetes mellitus with hyperglycemia: Secondary | ICD-10-CM

## 2014-11-29 DIAGNOSIS — R9431 Abnormal electrocardiogram [ECG] [EKG]: Secondary | ICD-10-CM

## 2014-11-29 DIAGNOSIS — Z792 Long term (current) use of antibiotics: Secondary | ICD-10-CM | POA: Insufficient documentation

## 2014-11-29 DIAGNOSIS — E669 Obesity, unspecified: Secondary | ICD-10-CM | POA: Insufficient documentation

## 2014-11-29 DIAGNOSIS — R079 Chest pain, unspecified: Secondary | ICD-10-CM | POA: Diagnosis not present

## 2014-11-29 DIAGNOSIS — K219 Gastro-esophageal reflux disease without esophagitis: Secondary | ICD-10-CM | POA: Diagnosis not present

## 2014-11-29 DIAGNOSIS — Z8673 Personal history of transient ischemic attack (TIA), and cerebral infarction without residual deficits: Secondary | ICD-10-CM

## 2014-11-29 DIAGNOSIS — I209 Angina pectoris, unspecified: Secondary | ICD-10-CM | POA: Diagnosis not present

## 2014-11-29 DIAGNOSIS — R778 Other specified abnormalities of plasma proteins: Secondary | ICD-10-CM

## 2014-11-29 DIAGNOSIS — N289 Disorder of kidney and ureter, unspecified: Secondary | ICD-10-CM

## 2014-11-29 DIAGNOSIS — G629 Polyneuropathy, unspecified: Secondary | ICD-10-CM | POA: Diagnosis not present

## 2014-11-29 DIAGNOSIS — I252 Old myocardial infarction: Secondary | ICD-10-CM | POA: Insufficient documentation

## 2014-11-29 DIAGNOSIS — E785 Hyperlipidemia, unspecified: Secondary | ICD-10-CM | POA: Insufficient documentation

## 2014-11-29 DIAGNOSIS — Z7901 Long term (current) use of anticoagulants: Secondary | ICD-10-CM | POA: Diagnosis not present

## 2014-11-29 DIAGNOSIS — M797 Fibromyalgia: Secondary | ICD-10-CM | POA: Insufficient documentation

## 2014-11-29 DIAGNOSIS — J45909 Unspecified asthma, uncomplicated: Secondary | ICD-10-CM | POA: Insufficient documentation

## 2014-11-29 DIAGNOSIS — Z880 Allergy status to penicillin: Secondary | ICD-10-CM | POA: Insufficient documentation

## 2014-11-29 DIAGNOSIS — E119 Type 2 diabetes mellitus without complications: Secondary | ICD-10-CM | POA: Diagnosis not present

## 2014-11-29 DIAGNOSIS — Z9981 Dependence on supplemental oxygen: Secondary | ICD-10-CM | POA: Insufficient documentation

## 2014-11-29 DIAGNOSIS — Z0389 Encounter for observation for other suspected diseases and conditions ruled out: Secondary | ICD-10-CM

## 2014-11-29 DIAGNOSIS — E875 Hyperkalemia: Secondary | ICD-10-CM | POA: Diagnosis present

## 2014-11-29 DIAGNOSIS — Z79899 Other long term (current) drug therapy: Secondary | ICD-10-CM | POA: Diagnosis not present

## 2014-11-29 DIAGNOSIS — R7989 Other specified abnormal findings of blood chemistry: Secondary | ICD-10-CM

## 2014-11-29 DIAGNOSIS — G2581 Restless legs syndrome: Secondary | ICD-10-CM | POA: Insufficient documentation

## 2014-11-29 DIAGNOSIS — Z87891 Personal history of nicotine dependence: Secondary | ICD-10-CM | POA: Insufficient documentation

## 2014-11-29 DIAGNOSIS — I482 Chronic atrial fibrillation, unspecified: Secondary | ICD-10-CM | POA: Diagnosis present

## 2014-11-29 DIAGNOSIS — IMO0001 Reserved for inherently not codable concepts without codable children: Secondary | ICD-10-CM

## 2014-11-29 DIAGNOSIS — Z794 Long term (current) use of insulin: Secondary | ICD-10-CM | POA: Diagnosis not present

## 2014-11-29 DIAGNOSIS — J42 Unspecified chronic bronchitis: Secondary | ICD-10-CM | POA: Diagnosis present

## 2014-11-29 DIAGNOSIS — Z9889 Other specified postprocedural states: Secondary | ICD-10-CM | POA: Diagnosis not present

## 2014-11-29 DIAGNOSIS — R0602 Shortness of breath: Secondary | ICD-10-CM | POA: Diagnosis not present

## 2014-11-29 DIAGNOSIS — R0789 Other chest pain: Secondary | ICD-10-CM | POA: Diagnosis not present

## 2014-11-29 DIAGNOSIS — E1142 Type 2 diabetes mellitus with diabetic polyneuropathy: Secondary | ICD-10-CM | POA: Diagnosis present

## 2014-11-29 HISTORY — DX: Abnormal electrocardiogram (ECG) (EKG): R94.31

## 2014-11-29 HISTORY — DX: Reserved for inherently not codable concepts without codable children: IMO0001

## 2014-11-29 HISTORY — DX: Disorder of kidney and ureter, unspecified: N28.9

## 2014-11-29 HISTORY — DX: Hyperkalemia: E87.5

## 2014-11-29 HISTORY — DX: Other specified abnormalities of plasma proteins: R77.8

## 2014-11-29 HISTORY — DX: Other specified abnormal findings of blood chemistry: R79.89

## 2014-11-29 LAB — BASIC METABOLIC PANEL
ANION GAP: 12 (ref 5–15)
BUN: 29 mg/dL — ABNORMAL HIGH (ref 6–23)
CALCIUM: 9.3 mg/dL (ref 8.4–10.5)
CO2: 24 mmol/L (ref 19–32)
Chloride: 101 mmol/L (ref 96–112)
Creatinine, Ser: 1.66 mg/dL — ABNORMAL HIGH (ref 0.50–1.35)
GFR, EST AFRICAN AMERICAN: 48 mL/min — AB (ref >90)
GFR, EST NON AFRICAN AMERICAN: 41 mL/min — AB (ref >90)
Glucose, Bld: 146 mg/dL — ABNORMAL HIGH (ref 70–99)
Potassium: 6.2 mmol/L (ref 3.5–5.1)
SODIUM: 137 mmol/L (ref 135–145)

## 2014-11-29 LAB — CBC WITH DIFFERENTIAL/PLATELET
Basophils Absolute: 0 10*3/uL (ref 0.0–0.1)
Basophils Relative: 0 % (ref 0–1)
EOS ABS: 0 10*3/uL (ref 0.0–0.7)
Eosinophils Relative: 0 % (ref 0–5)
HEMATOCRIT: 42.2 % (ref 39.0–52.0)
Hemoglobin: 14.2 g/dL (ref 13.0–17.0)
Lymphocytes Relative: 13 % (ref 12–46)
Lymphs Abs: 1.8 10*3/uL (ref 0.7–4.0)
MCH: 28.5 pg (ref 26.0–34.0)
MCHC: 33.6 g/dL (ref 30.0–36.0)
MCV: 84.6 fL (ref 78.0–100.0)
Monocytes Absolute: 0.6 10*3/uL (ref 0.1–1.0)
Monocytes Relative: 5 % (ref 3–12)
Neutro Abs: 11 10*3/uL — ABNORMAL HIGH (ref 1.7–7.7)
Neutrophils Relative %: 82 % — ABNORMAL HIGH (ref 43–77)
PLATELETS: 239 10*3/uL (ref 150–400)
RBC: 4.99 MIL/uL (ref 4.22–5.81)
RDW: 15.8 % — ABNORMAL HIGH (ref 11.5–15.5)
WBC: 13.4 10*3/uL — AB (ref 4.0–10.5)

## 2014-11-29 LAB — I-STAT TROPONIN, ED
Troponin i, poc: 0 ng/mL (ref 0.00–0.08)
Troponin i, poc: 0 ng/mL (ref 0.00–0.08)
Troponin i, poc: <0.2 ng/mL (ref 0.00–0.08)

## 2014-11-29 LAB — BRAIN NATRIURETIC PEPTIDE: B NATRIURETIC PEPTIDE 5: 66.7 pg/mL (ref 0.0–100.0)

## 2014-11-29 LAB — TROPONIN I: Troponin I: 0.07 ng/mL — ABNORMAL HIGH (ref <0.031)

## 2014-11-29 LAB — POTASSIUM: Potassium: 4.5 mmol/L (ref 3.5–5.1)

## 2014-11-29 NOTE — Discharge Instructions (Signed)

## 2014-11-29 NOTE — ED Notes (Signed)
Pt comfortable with discharge and follow up instructions. No prescriptions. 

## 2014-11-29 NOTE — ED Notes (Signed)
Pt to have potassium redraw once in room

## 2014-11-29 NOTE — ED Provider Notes (Signed)
CSN: [id number]     Arrival date & time 11/29/14  1400 History   First MD Initiated Contact with Patient 11/29/14 1514     Chief Complaint  Patient presents with  . Chest Pain    Patient is a 64 y.o. male presenting with chest pain. The history is provided by the patient.  Chest Pain Pain location:  L chest Pain quality: pressure   Radiates to: neck. Pain severity:  Moderate Duration:  1 day Timing:  Intermittent (last 5 minutes) Progression:  Improving Exacerbated by: not increased by walking, coughing, breathing. Associated symptoms: cough (not much), fever and shortness of breath   Associated symptoms: no nausea and not vomiting    Pt went to an urgent care yesterday.  He was told he had pneumonia and a blockage in his heart.  He was instructed to come to the ED.  He decided to come today.  He is somewhat better today than yesterday.  The pain is not as severe.  The urgent care prescribed abx for him yesterday.  His has history of chronic a fib and Chronic pulmonary disease.  He has a history of prior cath in [place] in 2011 reportedly showing no significant CAD Past Medical History  Diagnosis Date  . Hypothyroidism   . Dysrhythmia     atrial fib takes cardizem,   . Hypertension   . Angina   . Restless leg syndrome   . GERD (gastroesophageal reflux disease)   . Hyperlipemia   . H/O hiatal hernia   . Neuromuscular disorder     rls, neuropathy  . Obesity (BMI 30-39.9)   . Pericardial effusion   . Asthma   . Cough     coughing up blood- started last nite, seems to be worse now  . Atrial fibrillation   . Myocardial infarction     "one dr says yes; another says no"  . On home oxygen therapy     "2L w/CPAP at bedtime" (01/18/2014)  . Pneumonia 5/15    "several times" (01/18/2014)  . OSA on CPAP     cpap & oxygen  . IDDM (insulin dependent diabetes mellitus)     insulin pump   . Stroke 2012    denies residual on 01/18/2014  . Arthritis     "knees" (01/18/2014)  .  Fibromyalgia    Past Surgical History  Procedure Laterality Date  . Cataract extraction w/ intraocular lens  implant, bilateral Bilateral   . Sinus exploration  X 2  . Appendectomy    . Cholecystectomy    . Laparoscopic gastric banding  2010  . Video bronchoscopy Bilateral 12/29/2013    Procedure: VIDEO BRONCHOSCOPY WITHOUT FLUORO;  Surgeon: Elsie Stain, MD;  Location: [facility];  Service: Endoscopy;  Laterality: Bilateral;  . Cardiac catheterization  X 2 then 03/03/2012     NL LVF, normal coronaries, vessels are small ([facility]: Dr. Beatrix Fetters)  . Hernia repair      UHR  . Umbilical hernia repair    . Video assisted thoracoscopy Left 01/24/2014    Procedure: VIDEO ASSISTED THORACOSCOPY;  Surgeon: Gaye Pollack, MD;  Location: [facility] OR;  Service: Thoracic;  Laterality: Left;  VATS/open lung biopsy  . Pericardial window Left 01/24/2014    Procedure: PERICARDIAL WINDOW;  Surgeon: Gaye Pollack, MD;  Location: [place];  Service: Thoracic;  Laterality: Left;  . Video bronchoscopy N/A 01/24/2014    Procedure: VIDEO BRONCHOSCOPY;  Surgeon: Gaye Pollack, MD;  Location: [place];  Service: Thoracic;  Laterality: N/A;   Family History  Problem Relation Age of Onset  . Anesthesia problems Neg Hx   . Hypotension Neg Hx   . Malignant hyperthermia Neg Hx   . Pseudochol deficiency Neg Hx   . Cancer Mother    History  Substance Use Topics  . Smoking status: Former Smoker -- 4 years    Types: Cigars    Quit date: 09/01/2006  . Smokeless tobacco: Never Used  . 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  Respiratory: Positive for cough (not much) and shortness of breath.   Cardiovascular: Positive for chest pain.  Gastrointestinal: Negative for nausea and vomiting.  All other systems reviewed and are negative.     Allergies  Codeine and Penicillins  Home Medications   Prior to Admission  medications   Medication Sig Start Date End Date Taking? Authorizing Provider  albuterol (PROVENTIL HFA;VENTOLIN HFA) 108 (90 BASE) MCG/ACT inhaler Inhale 2 puffs into the lungs every 6 (six) hours as needed for wheezing or shortness of breath.   Yes Historical Provider, MD  albuterol (PROVENTIL) (2.5 MG/3ML) 0.083% nebulizer solution Take 2.5 mg by nebulization 2 (two) times daily. And as needed   Yes Historical Provider, MD  atorvastatin (LIPITOR) 40 MG tablet Take 40 mg by mouth daily.   Yes Historical Provider, MD  azelastine (ASTELIN) 137 MCG/SPRAY nasal spray Place 1 spray into both nostrils at bedtime as needed for allergies.    Yes Historical Provider, MD  dexlansoprazole (DEXILANT) 60 MG capsule Take 60 mg by mouth daily.   Yes Historical Provider, MD  diltiazem (CARDIZEM) 30 MG tablet Take 30 mg by mouth 2 (two) times daily.   Yes Historical Provider, MD  famotidine (PEPCID) 20 MG tablet Take 1 tablet (20 mg total) by mouth at bedtime. 02/28/14  Yes Elsie Stain, MD  fluticasone (FLONASE) 50 MCG/ACT nasal spray Place 1 spray into both nostrils daily as needed for allergies.    Yes Historical Provider, MD  Fluticasone-Salmeterol (ADVAIR) 500-50 MCG/DOSE AEPB Inhale 1 puff into the lungs every 12 (twelve) hours. 10/10/14  Yes Elsie Stain, MD  hydrALAZINE (APRESOLINE) 50 MG tablet Take 50 mg by mouth. Take 1 tablet in am and 2 tablets at night by mouth   Yes Historical Provider, MD  Insulin Human (INSULIN PUMP) 100 unit/ml SOLN Inject into the skin continuous. Inject 0.5 units of novolog every hour per insulin pump.   Yes Historical Provider, MD  ipratropium (ATROVENT) 0.02 % nebulizer solution Take 0.5 mg by nebulization 4 (four) times daily as needed for wheezing or shortness of breath.   Yes Historical Provider, MD  isosorbide mononitrate (IMDUR) 60 MG 24 hr tablet Take 60 mg by mouth every morning.   Yes Historical Provider, MD  latanoprost (XALATAN) 0.005 % ophthalmic solution Place  1 drop into both eyes at bedtime.   Yes Historical Provider, MD  levofloxacin (LEVAQUIN) 750 MG tablet Take 750 mg by mouth daily. Started on 11-28-14 take for 10 days   Yes Historical Provider, MD  lisinopril (PRINIVIL,ZESTRIL) 40 MG tablet Take 40 mg by mouth 2 (two) times daily.   Yes Historical Provider, MD  magnesium oxide (MAG-OX) 400 MG tablet Take 1,600 mg by mouth daily.    Yes Historical Provider, MD  metolazone (ZAROXOLYN) 2.5 MG tablet Take 2.5 mg by mouth daily as needed.  For dizzy   Yes Historical Provider, MD  MOVANTIK 25 MG TABS 25 mg. Take 1 tablet by mouth daily 09/05/14  Yes Historical Provider, MD  Multiple Vitamins-Minerals (CENTRUM SILVER PO) Take 1 tablet by mouth daily.   Yes Historical Provider, MD  nitroGLYCERIN (NITROSTAT) 0.4 MG SL tablet Place 0.4 mg under the tongue every 5 (five) minutes as needed. For chest pain   Yes Historical Provider, MD  OXYCONTIN 10 MG T12A 12 hr tablet Take 10 mg by mouth 2 (two) times daily. 07/12/14  Yes Historical Provider, MD  Polyethyl Glycol-Propyl Glycol (SYSTANE PRESERVATIVE FREE) 0.4-0.3 % SOLN Apply 1 drop to eye 4 (four) times daily as needed (dryness of eye).    Yes Historical Provider, MD  potassium chloride SA (K-DUR,KLOR-CON) 20 MEQ tablet Take 20 mEq by mouth 3 (three) times daily.   Yes Historical Provider, MD  pramipexole (MIRAPEX) 1.5 MG tablet Take 1.5 mg by mouth at bedtime.   Yes Historical Provider, MD  predniSONE (DELTASONE) 10 MG tablet Take two daily 10/10/14  Yes Elsie Stain, MD  pregabalin (LYRICA) 75 MG capsule Take 1 capsule (75 mg total) by mouth 2 (two) times daily. 01/05/14  Yes Grace Bushy Minor, NP  rivaroxaban (XARELTO) 20 MG TABS tablet Take 1 tablet (20 mg total) by mouth daily with supper. 03/21/14  Yes Elsie Stain, MD  torsemide (DEMADEX) 20 MG tablet Take 20 mg by mouth 2 (two) times daily.   Yes Historical Provider, MD   BP 135/71 mmHg  Pulse 70  Temp(Src) 97.8 F (36.6 C) (Oral)  Resp 10  Ht 5\' 7"   (1.702 m)  Wt 247 lb 1.6 oz (112.084 kg)  BMI 38.69 kg/m2  SpO2 100% Physical Exam  Constitutional: No distress.  HENT:  Head: Normocephalic and atraumatic.  Right Ear: External ear normal.  Left Ear: External ear normal.  Eyes: Conjunctivae are normal. Right eye exhibits no discharge. Left eye exhibits no discharge. No scleral icterus.  Neck: Neck supple. No tracheal deviation present.  Cardiovascular: Normal rate, regular rhythm and intact distal pulses.   Pulmonary/Chest: Effort normal and breath sounds normal. No stridor. No respiratory distress. He has no wheezes. He has no rales. He exhibits no tenderness.  Abdominal: Soft. Bowel sounds are normal. He exhibits no distension. There is no tenderness. There is no rebound and no guarding.  Musculoskeletal: He exhibits no edema or tenderness.  Neurological: He is alert. He has normal strength. No cranial nerve deficit (no facial droop, extraocular movements intact, no slurred speech) or sensory deficit. He exhibits normal muscle tone. He displays no seizure activity. Coordination normal.  Skin: Skin is warm and dry. No rash noted. He is not diaphoretic.  Psychiatric: He has a normal mood and affect.  Nursing note and vitals reviewed.   ED Course  Procedures (including critical care time) Labs Review Labs Reviewed  BASIC METABOLIC PANEL - Abnormal; Notable for the following:    Potassium 6.2 (*)    Glucose, Bld 146 (*)    BUN 29 (*)    Creatinine, Ser 1.66 (*)    GFR calc non Af Amer 41 (*)    GFR calc Af Amer 48 (*)    All other components within normal limits  CBC WITH DIFFERENTIAL/PLATELET - Abnormal; Notable for the following:    WBC 13.4 (*)    RDW 15.8 (*)    Neutrophils Relative % 82 (*)    Neutro Abs 11.0 (*)    All other components  within normal limits  TROPONIN I - Abnormal; Notable for the following:    Troponin I 0.07 (*)    All other components within normal limits  I-STAT TROPOININ, ED - Abnormal; Notable for  the following:    Troponin i, poc <0.20 (*)    All other components within normal limits  POTASSIUM  BRAIN NATRIURETIC PEPTIDE  I-STAT TROPOININ, ED  I-STAT TROPOININ, ED    Imaging Review Dg Chest 2 View  11/29/2014   CLINICAL DATA:  One day history of left-sided chest pressure ; history of frequent episodes of pneumonia, history of diabetes, pericardial effusions 6 months ago; previous tobacco use  EXAM: CHEST  2 VIEW  COMPARISON:  PA and lateral chest of July 21, 2014  FINDINGS: The right lung is adequately inflated and clear. On the left there are chronically increased lung markings likely reflecting subsegmental atelectasis and scarring. The appearance of the left lung is improved since November 2015. The cardiopericardial silhouette remains enlarged. The pulmonary vascularity is not engorged. The trachea is midline. The bony thorax exhibits no acute abnormality.  IMPRESSION: There is no CHF or focal pneumonia. Chronically increased interstitial markings in the mid and lower left lung are present but have improved since the previous study. There is no residual pleural effusion.   Electronically Signed   By: David  Martinique   On: 11/29/2014 15:21     EKG Interpretation   Date/Time:  Wednesday November 29 2014 14:08:21 EDT Ventricular Rate:  88 PR Interval:    QRS Duration: 120 QT Interval:  364 QTC Calculation: 440 R Axis:   111 Text Interpretation:  Atrial fibrillation Right axis deviation  Anteroseptal infarct , age undetermined ST \\T \ T wave abnormality,  consider inferior ischemia Abnormal ECG No significant change since last  tracing Confirmed by Timisha Mondry  MD-J, Virtie Bungert [id number]) on 11/29/2014 4:00:14 PM      MDM   Final diagnoses:  Chest pain, unspecified chest pain type    Pt presents with chest pain and some shortness of breath.  Initial toponin is normal and no acute ECG changes. Multiple cardiac risk factors.  It has been several years since his last cath.  Sx are not  completely consistent with pna and cxr does not show pna today.  Will consult with cardiology whether patient requires admission and further evaluation.  Pt seen by [facility].  Symptoms not felt to be related to ACS.  Pt is ok to be discharged and follow up with his cardiologist.    Dorie Rank, MD 11/29/14 1901

## 2014-11-29 NOTE — ED Notes (Signed)
Cardiology at bedside.

## 2014-11-29 NOTE — ED Notes (Signed)
Pt sts left sided chest pressure x 3 days; pt went to [facility] yesterday and sent here for further eval

## 2014-11-29 NOTE — ED Notes (Signed)
Cardiology called to speak with Dr. Tomi Bamberger, states patient ok for discharge.

## 2014-11-29 NOTE — H&P (Addendum)
Patient ID: Jon [name] MRN: [id number], DOB/AGE: Jan 15, 1951   Admit date: 11/29/2014   Primary Physician: Rochel Brome, MD Primary Cardiologist: Dr Bettina Gavia  HPI: 64 y.o. male with a past medical history significant for IDDM with neuropathy, OSA on CPAP, prior stroke, COPD, HTN, hyperlipidemia, GERD, and morbid obesity. He is followed by Dr Tobie Poet in [place]. He is also followed by Dr Bettina Gavia (he saw [place] once in June 2015 but has since re established care with Dr Bettina Gavia). He has a history of prior cath in [place] in 2011 reportedly showing no significant CAD. He has CAF "for years" per pt. He has been on chronic anticoagulation with Xarelto.   He presented 01/03/14 with CAP and Dr Debara Pickett saw him in consult. Chest CT showed a large pericardia effusion. Xarelto was held secondary to effusion and hemoptysis. He was readmitted 01/18/14 with recurrent hemoptysis. He underwent VATS, pericardial window, and lung biopsy 01/24/14. No obvious pathology was determined. On 01/30/14 he was re admitted with Lt brain stroke while off Xarelto post op. Fortunately he recovered and Xarelto was resumed. Yesterday he went to an Urgent Care-"felt bad- hot and cold". He was evaluated there and placed on ABs for CAP. He was told his EKG was abnormal with LBBB but they had no old EKGs and suggested he go to the hospital. He declined then but decided to come this PM. He denies chest pain. 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 His initial Troponin was negative but his second POC marker was elevated at 0.2. He also has a SCr of 1.6 and a K+ of 6.2. Repeat labs are pending.    Problem List: Past Medical History  Diagnosis Date  . Hypothyroidism   . Dysrhythmia     atrial fib takes cardizem,   . Hypertension   . Angina   . Restless leg syndrome   . GERD (gastroesophageal reflux disease)   . Hyperlipemia   . H/O hiatal hernia   . Neuromuscular disorder     rls, neuropathy  . Obesity (BMI 30-39.9)   . Pericardial effusion   . Asthma   .  Cough     coughing up blood- started last nite, seems to be worse now  . Atrial fibrillation   . Myocardial infarction     "one dr says yes; another says no"  . On home oxygen therapy     "2L w/CPAP at bedtime" (01/18/2014)  . Pneumonia 5/15    "several times" (01/18/2014)  . OSA on CPAP     cpap & oxygen  . IDDM (insulin dependent diabetes mellitus)     insulin pump   . Stroke 2012    denies residual on 01/18/2014  . Arthritis     "knees" (01/18/2014)  . Fibromyalgia     Past Surgical History  Procedure Laterality Date  . Cataract extraction w/ intraocular lens  implant, bilateral Bilateral   . Sinus exploration  X 2  . Appendectomy    . Cholecystectomy    . Laparoscopic gastric banding  2010  . Video bronchoscopy Bilateral 12/29/2013    Procedure: VIDEO BRONCHOSCOPY WITHOUT FLUORO;  Surgeon: Elsie Stain, MD;  Location: [facility];  Service: Endoscopy;  Laterality: Bilateral;  . Cardiac catheterization  X 2 then 03/03/2012     NL LVF, normal coronaries, vessels are small (HPR: Dr. Beatrix Fetters)  . Hernia repair      UHR  . Umbilical hernia repair    . Video assisted thoracoscopy Left 01/24/2014  Procedure: VIDEO ASSISTED THORACOSCOPY;  Surgeon: Gaye Pollack, MD;  [facility] OR;  Service: Thoracic;  Laterality: Left;  VATS/open lung biopsy  . Pericardial window Left 01/24/2014    Procedure: PERICARDIAL WINDOW;  Surgeon: Gaye Pollack, MD;  Location: [facility];  Service: Thoracic;  Laterality: Left;  . Video bronchoscopy N/A 01/24/2014    Procedure: VIDEO BRONCHOSCOPY;  Surgeon: Gaye Pollack, MD;  Location: [facility] OR;  Service: Thoracic;  Laterality: N/A;     Allergies:  Allergies  Allergen Reactions  . Codeine Itching  . Penicillins Rash     Home Medications No current facility-administered medications for this encounter.   Current Outpatient Prescriptions  Medication Sig Dispense Refill  . albuterol (PROVENTIL HFA;VENTOLIN HFA) 108 (90 BASE) MCG/ACT inhaler Inhale 2  puffs into the lungs every 6 (six) hours as needed for wheezing or shortness of breath.    Marland Kitchen albuterol (PROVENTIL) (2.5 MG/3ML) 0.083% nebulizer solution Take 2.5 mg by nebulization 2 (two) times daily. And as needed    . atorvastatin (LIPITOR) 40 MG tablet Take 40 mg by mouth daily.    Marland Kitchen azelastine (ASTELIN) 137 MCG/SPRAY nasal spray Place 1 spray into both nostrils at bedtime as needed for allergies.     Marland Kitchen dexlansoprazole (DEXILANT) 60 MG capsule Take 60 mg by mouth daily.    Marland Kitchen diltiazem (CARDIZEM) 30 MG tablet Take 30 mg by mouth 2 (two) times daily.    . famotidine (PEPCID) 20 MG tablet Take 1 tablet (20 mg total) by mouth at bedtime. 30 tablet 6  . fluticasone (FLONASE) 50 MCG/ACT nasal spray Place 1 spray into both nostrils daily as needed for allergies.     . Fluticasone-Salmeterol (ADVAIR) 500-50 MCG/DOSE AEPB Inhale 1 puff into the lungs every 12 (twelve) hours. 60 each 11  . hydrALAZINE (APRESOLINE) 50 MG tablet Take 50 mg by mouth. Take 1 tablet in am and 2 tablets at night by mouth    . Insulin Human (INSULIN PUMP) 100 unit/ml SOLN Inject into the skin continuous. Inject 0.5 units of novolog every hour per insulin pump.    Marland Kitchen ipratropium (ATROVENT) 0.02 % nebulizer solution Take 0.5 mg by nebulization 4 (four) times daily as needed for wheezing or shortness of breath.    . isosorbide mononitrate (IMDUR) 60 MG 24 hr tablet Take 60 mg by mouth every morning.    . latanoprost (XALATAN) 0.005 % ophthalmic solution Place 1 drop into both eyes at bedtime.    Marland Kitchen levofloxacin (LEVAQUIN) 750 MG tablet Take 750 mg by mouth daily. Started on 11-28-14 take for 10 days    . lisinopril (PRINIVIL,ZESTRIL) 40 MG tablet Take 40 mg by mouth 2 (two) times daily.    . magnesium oxide (MAG-OX) 400 MG tablet Take 1,600 mg by mouth daily.     . metolazone (ZAROXOLYN) 2.5 MG tablet Take 2.5 mg by mouth daily as needed. For dizzy    . MOVANTIK 25 MG TABS 25 mg. Take 1 tablet by mouth daily    . Multiple  Vitamins-Minerals (CENTRUM SILVER PO) Take 1 tablet by mouth daily.    . nitroGLYCERIN (NITROSTAT) 0.4 MG SL tablet Place 0.4 mg under the tongue every 5 (five) minutes as needed. For chest pain    . OXYCONTIN 10 MG T12A 12 hr tablet Take 10 mg by mouth 2 (two) times daily.    Vladimir Faster Glycol-Propyl Glycol (SYSTANE PRESERVATIVE FREE) 0.4-0.3 % SOLN Apply 1 drop to eye 4 (four) times daily as needed (  dryness of eye).     . potassium chloride SA (K-DUR,KLOR-CON) 20 MEQ tablet Take 20 mEq by mouth 3 (three) times daily.    . pramipexole (MIRAPEX) 1.5 MG tablet Take 1.5 mg by mouth at bedtime.    . predniSONE (DELTASONE) 10 MG tablet Take two daily 60 tablet 6  . pregabalin (LYRICA) 75 MG capsule Take 1 capsule (75 mg total) by mouth 2 (two) times daily. 60 capsule 0  . rivaroxaban (XARELTO) 20 MG TABS tablet Take 1 tablet (20 mg total) by mouth daily with supper. 30 tablet 11  . torsemide (DEMADEX) 20 MG tablet Take 20 mg by mouth 2 (two) times daily.       Family History  Problem Relation Age of Onset  . Anesthesia problems Neg Hx   . Hypotension Neg Hx   . Malignant hyperthermia Neg Hx   . Pseudochol deficiency Neg Hx   . Cancer Mother      History   Social History  . Marital Status: Married    Spouse Name: N/A  . Number of Children: N/A  . Years of Education: N/A   Occupational History  . Not on file.   Social History Main Topics  . Smoking status: Former Smoker -- 4 years    Types: Cigars    Quit date: 09/01/2006  . Smokeless tobacco: Never Used  . 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 Drug Use: No  . Sexual Activity: No   Other Topics Concern  . Not on file   Social History Narrative     Review of Systems: General: negative for chills, fever, night sweats or weight changes.  Cardiovascular: negative for chest pain, dyspnea on exertion, edema, orthopnea, palpitations, paroxysmal nocturnal dyspnea or shortness of  breath Dermatological: negative for rash Respiratory: negative for cough or wheezing Urologic: negative for hematuria Abdominal: negative for nausea, vomiting, diarrhea, bright red blood per rectum, melena, or hematemesis Neurologic: negative for visual changes, syncope, or dizziness All other systems reviewed and are otherwise negative except as noted above.  Physical Exam: Blood pressure 155/75, pulse 77, temperature 97.8 F (36.6 C), temperature source Oral, resp. rate 24, height 5\' 7"  (1.702 m), weight 247 lb 1.6 oz (112.084 kg), SpO2 100 %.  General appearance: alert, cooperative, no distress and morbidly obese Neck: no carotid bruit and no JVD Lungs: crackles Lt base Heart: irregularly irregular rhythm Abdomen: obese, insul;in pump in place Extremities: chronic venous changes, intact pulses Pulses: 2+ and symmetric Skin: Skin color, texture, turgor normal. No rashes or lesions Neurologic: Grossly normal    Labs:   Results for orders placed or performed during the hospital encounter of 11/29/14 (from the past 24 hour(s))  Basic metabolic panel     Status: Abnormal   Collection Time: 11/29/14  2:10 PM  Result Value Ref Range   Sodium 137 135 - 145 mmol/L   Potassium 6.2 (HH) 3.5 - 5.1 mmol/L   Chloride 101 96 - 112 mmol/L   CO2 24 19 - 32 mmol/L   Glucose, Bld 146 (H) 70 - 99 mg/dL   BUN 29 (H) 6 - 23 mg/dL   Creatinine, Ser 1.66 (H) 0.50 - 1.35 mg/dL   Calcium 9.3 8.4 - 10.5 mg/dL   GFR calc non Af Amer 41 (L) >90 mL/min   GFR calc Af Amer 48 (L) >90 mL/min   Anion gap 12 5 - 15  CBC with Differential  Status: Abnormal   Collection Time: 11/29/14  2:10 PM  Result Value Ref Range   WBC 13.4 (H) 4.0 - 10.5 K/uL   RBC 4.99 4.22 - 5.81 MIL/uL   Hemoglobin 14.2 13.0 - 17.0 g/dL   HCT 42.2 39.0 - 52.0 %   MCV 84.6 78.0 - 100.0 fL   MCH 28.5 26.0 - 34.0 pg   MCHC 33.6 30.0 - 36.0 g/dL   RDW 15.8 (H) 11.5 - 15.5 %   Platelets 239 150 - 400 K/uL   Neutrophils  Relative % 82 (H) 43 - 77 %   Neutro Abs 11.0 (H) 1.7 - 7.7 K/uL   Lymphocytes Relative 13 12 - 46 %   Lymphs Abs 1.8 0.7 - 4.0 K/uL   Monocytes Relative 5 3 - 12 %   Monocytes Absolute 0.6 0.1 - 1.0 K/uL   Eosinophils Relative 0 0 - 5 %   Eosinophils Absolute 0.0 0.0 - 0.7 K/uL   Basophils Relative 0 0 - 1 %   Basophils Absolute 0.0 0.0 - 0.1 K/uL  I-stat troponin, ED (not at [facility])     Status: None   Collection Time: 11/29/14  2:21 PM  Result Value Ref Range   Troponin i, poc 0.00 0.00 - 0.08 ng/mL   Comment 3          I-Stat Troponin, ED (not at [facility])     Status: Abnormal   Collection Time: 11/29/14  4:39 PM  Result Value Ref Range   Troponin i, poc <0.20 (HH) 0.00 - 0.08 ng/mL   Comment 3             Radiology/Studies: Dg Chest 2 View  11/29/2014   CLINICAL DATA:  One day history of left-sided chest pressure ; history of frequent episodes of pneumonia, history of diabetes, pericardial effusions 6 months ago; previous tobacco use  EXAM: CHEST  2 VIEW  COMPARISON:  PA and lateral chest of July 21, 2014  FINDINGS: The right lung is adequately inflated and clear. On the left there are chronically increased lung markings likely reflecting subsegmental atelectasis and scarring. The appearance of the left lung is improved since November 2015. The cardiopericardial silhouette remains enlarged. The pulmonary vascularity is not engorged. The trachea is midline. The bony thorax exhibits no acute abnormality.  IMPRESSION: There is no CHF or focal pneumonia. Chronically increased interstitial markings in the mid and lower left lung are present but have improved since the previous study. There is no residual pleural effusion.   Electronically Signed   By: David  Martinique   On: 11/29/2014 15:21    EKG: AF, LBBB  ASSESSMENT AND PLAN:  Principal Problem:   Abnormal EKG Active Problems:   Hyperkalemia-repeat pending   Obstructive chronic bronchitis without exacerbation   Hypertension   Obesity  (BMI 30-39.9)   Type 2 diabetes mellitus with renal manifestations, controlled   Chronic atrial fibrillation   Acute renal insufficiency   Troponin level elevated   CAP (community acquired pneumonia)   History of stroke   Chronic anticoagulation-Xarelto   Normal coronary arteries 2011   PLAN: Repeat Troponin I.    Henri Medal, PA-C 11/29/2014, 5:19 PM   Agree with note written by Kerin Ransom PAC  Pt with LBBB which is old and CAF on Xarelto. We are asked to see because of minimally elevated POC trop. He is asymptomatic. On eliquis AC. H/O embolic CVA in past when he was off AC. Nl cors at cath and  nl LV fxn by 2D. Suspect mildly elevated POC trop is spurious (Trop I pending) and elevated K prob spurious as well. If these are nl pt can be D/Cd home with follow up in Dr. [name] office in [place].  Lorretta Harp 11/29/2014 5:38 PM   Labs reviewed with Dr Gwenlyn Found- No history of chest pain. OK to discharge home and follow up with Dr Bettina Gavia in [place] PA-C 11/29/2014 6:41 PM

## 2014-12-11 DIAGNOSIS — J158 Pneumonia due to other specified bacteria: Secondary | ICD-10-CM | POA: Diagnosis not present

## 2014-12-11 DIAGNOSIS — R0789 Other chest pain: Secondary | ICD-10-CM | POA: Diagnosis not present

## 2014-12-11 DIAGNOSIS — M170 Bilateral primary osteoarthritis of knee: Secondary | ICD-10-CM | POA: Diagnosis not present

## 2014-12-11 DIAGNOSIS — E1142 Type 2 diabetes mellitus with diabetic polyneuropathy: Secondary | ICD-10-CM | POA: Diagnosis not present

## 2014-12-11 DIAGNOSIS — J449 Chronic obstructive pulmonary disease, unspecified: Secondary | ICD-10-CM | POA: Diagnosis not present

## 2015-01-11 DIAGNOSIS — Z4651 Encounter for fitting and adjustment of gastric lap band: Secondary | ICD-10-CM | POA: Diagnosis not present

## 2015-01-12 DIAGNOSIS — I10 Essential (primary) hypertension: Secondary | ICD-10-CM | POA: Diagnosis not present

## 2015-01-12 DIAGNOSIS — J449 Chronic obstructive pulmonary disease, unspecified: Secondary | ICD-10-CM | POA: Diagnosis not present

## 2015-01-12 DIAGNOSIS — E1121 Type 2 diabetes mellitus with diabetic nephropathy: Secondary | ICD-10-CM | POA: Diagnosis not present

## 2015-01-12 DIAGNOSIS — G5602 Carpal tunnel syndrome, left upper limb: Secondary | ICD-10-CM | POA: Diagnosis not present

## 2015-01-12 DIAGNOSIS — G4733 Obstructive sleep apnea (adult) (pediatric): Secondary | ICD-10-CM | POA: Diagnosis not present

## 2015-01-12 DIAGNOSIS — I482 Chronic atrial fibrillation: Secondary | ICD-10-CM | POA: Diagnosis not present

## 2015-01-12 DIAGNOSIS — E782 Mixed hyperlipidemia: Secondary | ICD-10-CM | POA: Diagnosis not present

## 2015-01-12 DIAGNOSIS — M170 Bilateral primary osteoarthritis of knee: Secondary | ICD-10-CM | POA: Diagnosis not present

## 2015-01-16 ENCOUNTER — Other Ambulatory Visit: Payer: Self-pay | Admitting: Critical Care Medicine

## 2015-01-19 DIAGNOSIS — I447 Left bundle-branch block, unspecified: Secondary | ICD-10-CM | POA: Diagnosis not present

## 2015-01-19 DIAGNOSIS — R079 Chest pain, unspecified: Secondary | ICD-10-CM | POA: Diagnosis not present

## 2015-01-19 DIAGNOSIS — J90 Pleural effusion, not elsewhere classified: Secondary | ICD-10-CM | POA: Diagnosis not present

## 2015-01-19 DIAGNOSIS — I482 Chronic atrial fibrillation: Secondary | ICD-10-CM | POA: Diagnosis not present

## 2015-01-25 DIAGNOSIS — Z9989 Dependence on other enabling machines and devices: Secondary | ICD-10-CM

## 2015-01-25 DIAGNOSIS — J449 Chronic obstructive pulmonary disease, unspecified: Secondary | ICD-10-CM | POA: Insufficient documentation

## 2015-01-25 DIAGNOSIS — M549 Dorsalgia, unspecified: Secondary | ICD-10-CM | POA: Insufficient documentation

## 2015-01-25 DIAGNOSIS — G4733 Obstructive sleep apnea (adult) (pediatric): Secondary | ICD-10-CM | POA: Insufficient documentation

## 2015-01-25 HISTORY — DX: Chronic obstructive pulmonary disease, unspecified: J44.9

## 2015-01-25 HISTORY — DX: Obstructive sleep apnea (adult) (pediatric): G47.33

## 2015-01-25 HISTORY — DX: Dorsalgia, unspecified: M54.9

## 2015-01-26 DIAGNOSIS — I2510 Atherosclerotic heart disease of native coronary artery without angina pectoris: Secondary | ICD-10-CM | POA: Diagnosis not present

## 2015-01-26 DIAGNOSIS — Z885 Allergy status to narcotic agent status: Secondary | ICD-10-CM | POA: Diagnosis not present

## 2015-01-26 DIAGNOSIS — R072 Precordial pain: Secondary | ICD-10-CM | POA: Insufficient documentation

## 2015-01-26 DIAGNOSIS — Z880 Allergy status to penicillin: Secondary | ICD-10-CM | POA: Diagnosis not present

## 2015-01-26 DIAGNOSIS — N189 Chronic kidney disease, unspecified: Secondary | ICD-10-CM | POA: Diagnosis not present

## 2015-01-26 DIAGNOSIS — I25118 Atherosclerotic heart disease of native coronary artery with other forms of angina pectoris: Secondary | ICD-10-CM | POA: Diagnosis not present

## 2015-01-26 HISTORY — DX: Precordial pain: R07.2

## 2015-01-30 ENCOUNTER — Encounter: Payer: Self-pay | Admitting: Critical Care Medicine

## 2015-01-30 ENCOUNTER — Ambulatory Visit (INDEPENDENT_AMBULATORY_CARE_PROVIDER_SITE_OTHER): Payer: Medicare Other | Admitting: Critical Care Medicine

## 2015-01-30 ENCOUNTER — Telehealth: Payer: Self-pay | Admitting: Critical Care Medicine

## 2015-01-30 VITALS — BP 110/60 | HR 80 | Temp 99.1°F | Ht 67.0 in | Wt 248.6 lb

## 2015-01-30 DIAGNOSIS — J8489 Other specified interstitial pulmonary diseases: Secondary | ICD-10-CM | POA: Diagnosis not present

## 2015-01-30 MED ORDER — LOSARTAN POTASSIUM 100 MG PO TABS: 100.0000 mg | ORAL_TABLET | Freq: Every day | ORAL | Status: DC

## 2015-01-30 MED ORDER — PREDNISONE 10 MG PO TABS: ORAL_TABLET | ORAL | Status: DC

## 2015-01-30 MED ORDER — FLUTICASONE-SALMETEROL 500-50 MCG/DOSE IN AEPB: 1.0000 | INHALATION_SPRAY | Freq: Two times a day (BID) | RESPIRATORY_TRACT | Status: DC

## 2015-01-30 NOTE — Telephone Encounter (Signed)
Called and spoke to pt. Pt stated he needed a refill of his pred sent to [facility] as PW increased the dose. Rx already sent in to correct pharmacy by PW. Pt verbalized understanding and denied any further questions or concerns at this time.

## 2015-01-30 NOTE — Assessment & Plan Note (Signed)
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

## 2015-01-30 NOTE — Patient Instructions (Signed)
Increase advair to 500 one puff TWICE daily Increase prednisone to 10mg  : Take 4 for seven  days 3 for seven  days then 2 daily and stay Stop lisinopril Start losartan 100mg  daily  No other changes Return 1 month

## 2015-01-30 NOTE — Progress Notes (Signed)
Subjective:    Patient ID: Jon [name], male    DOB: 09-04-1950, 64 y.o.   MRN: [id number]  HPI 01/30/2015 Chief Complaint  Patient presents with  . 4 month follow up    Heart Cath done friday at [facility].  CP unchanged.  Hemoptysis still present approx every 1-2 days with bright and dark red blood.  Increased SOB x 5 days.      Still coughing up blood.  Still bright red , pure blood. Pt still with chest pain, heart cath done and was ok. Notes low grade fever 101.  Chest pain is dull pressure in L side of chest, not on right side Pt still on prednisone 20mg  /d  Now down to xarelto 15mg  per day.   Pt denies any significant sore throat, nasal congestion or excess secretions, fever, chills, sweats, unintended weight loss, pleurtic or exertional chest pain, orthopnea PND, or leg swelling Pt denies any increase in rescue therapy over baseline, denies waking up needing it or having any early am or nocturnal exacerbations of coughing/wheezing/or dyspnea. Pt also denies any obvious fluctuation in symptoms with  weather or environmental change or other alleviating or aggravating factors   Current Medications, Allergies, Complete Past Medical History, Past Surgical History, Family History, and Social History were reviewed in [facility] record per todays encounter:  01/30/2015  Review of Systems  Constitutional: Negative.   HENT: Negative.  Negative for ear pain, postnasal drip, rhinorrhea, sinus pressure, sore throat, trouble swallowing and voice change.   Eyes: Negative.   Respiratory: Positive for cough, shortness of breath and wheezing. Negative for apnea, choking, chest tightness and stridor.   Cardiovascular: Negative.  Negative for chest pain, palpitations and leg swelling.  Gastrointestinal: Negative.  Negative for nausea, vomiting, abdominal pain and abdominal distention.  Genitourinary: Negative.   Musculoskeletal: Negative.  Negative for myalgias  and arthralgias.  Skin: Negative.  Negative for rash.  Allergic/Immunologic: Negative.  Negative for environmental allergies and food allergies.  Neurological: Negative.  Negative for dizziness, syncope, weakness and headaches.  Hematological: Negative.  Negative for adenopathy. Does not bruise/bleed easily.  Psychiatric/Behavioral: Negative.  Negative for sleep disturbance and agitation. The patient is not nervous/anxious.        Objective:   Physical Exam Filed Vitals:   01/30/15 1054  BP: 110/60  Pulse: 80  Temp: 99.1 F (37.3 C)  TempSrc: Oral  Height: 5\' 7"  (1.702 m)  Weight: 248 lb 9.6 oz (112.764 kg)  SpO2: 95%    Gen: Pleasant, well-nourished, in no distress,  normal affect  ENT: No lesions,  mouth clear,  oropharynx clear, no postnasal drip  Neck: No JVD, no TMG, no carotid bruits  Lungs: No use of accessory muscles, no dullness to percussion, scattered rales and exp wheezes  Cardiovascular: RRR, heart sounds normal, no murmur or gallops, no peripheral edema  Abdomen: soft and NT, no HSM,  BS normal  Musculoskeletal: No deformities, no cyanosis or clubbing  Neuro: alert, non focal  Skin: Warm, no lesions or rashes  No results found. CT chest reviewed from previous and CXR reviewed recent        Assessment & Plan:  I personally reviewed all images and lab data in the [facility] system as well as any outside material available during this office visit and agree with the  radiology impressions.   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  Diagnoses and all orders for this visit:  BOOP (bronchiolitis obliterans with organizing pneumonia)  Other orders -     Fluticasone-Salmeterol (ADVAIR) 500-50 MCG/DOSE AEPB; Inhale 1 puff into the lungs every 12 (twelve) hours. -     losartan (COZAAR) 100 MG tablet; Take 1 tablet (100 mg total) by mouth daily. -     predniSONE (DELTASONE) 10 MG tablet; Take 4 for 7days, 3 for 7 days, then 2 daily and STAY    I had an extended discussion with the patient and or family lasting 10 minutes of a 25 minute visit including:  Need for rx , side effects, issue with lisinopril use

## 2015-01-30 NOTE — Telephone Encounter (Signed)
Pt seen today.  Prednisone increased.  Pt has concerns regarding increase pred taper increasing BS.  Pt on insulin pump. I called Dr. Alyse Low office, per PW's recs.  Spoke with Dr. Alyse Low nurse, Hoyle Sauer. Advised pt placed on pred taper and discussed reasoning.   Asked to have Dr. Tobie Poet manage pt's insulin pump accordingly and asked for them to call pt to advise of further recs regarding insulin. Hoyle Sauer states she will discuss with Dr. Tobie Poet and will call pt further further recs regarding insulin.  Pt aware.

## 2015-02-07 ENCOUNTER — Telehealth: Payer: Self-pay | Admitting: *Deleted

## 2015-02-07 NOTE — Telephone Encounter (Signed)
Faxed notification to [place] that surgical clearance for left carpal tunnel release under general anesthesia will need to be obtained from Dr. Bettina Gavia - patient's cardiologist  Left voicemail for Beola Cord, [place] for Dr. Joya Salm with this information as well (ext: [phone])

## 2015-02-20 DIAGNOSIS — I2510 Atherosclerotic heart disease of native coronary artery without angina pectoris: Secondary | ICD-10-CM

## 2015-02-20 DIAGNOSIS — I4891 Unspecified atrial fibrillation: Secondary | ICD-10-CM | POA: Insufficient documentation

## 2015-02-20 HISTORY — DX: Atherosclerotic heart disease of native coronary artery without angina pectoris: I25.10

## 2015-02-20 NOTE — Patient Outreach (Signed)
[place] [facility]) Care Management  02/20/2015  Jon [name] 1950/12/04 [id number]   Referral per High Risk List, assigned Tomasa Rand, RN to outreach.  [name]. [facility] [place] Management [place] Assistant Phone: [phone] Fax: [phone]

## 2015-02-22 ENCOUNTER — Other Ambulatory Visit: Payer: Self-pay

## 2015-02-22 DIAGNOSIS — I447 Left bundle-branch block, unspecified: Secondary | ICD-10-CM

## 2015-02-22 DIAGNOSIS — R188 Other ascites: Secondary | ICD-10-CM | POA: Insufficient documentation

## 2015-02-22 DIAGNOSIS — I509 Heart failure, unspecified: Secondary | ICD-10-CM | POA: Insufficient documentation

## 2015-02-22 DIAGNOSIS — I5032 Chronic diastolic (congestive) heart failure: Secondary | ICD-10-CM | POA: Insufficient documentation

## 2015-02-22 HISTORY — DX: Other ascites: R18.8

## 2015-02-22 HISTORY — DX: Chronic diastolic (congestive) heart failure: I50.32

## 2015-02-22 HISTORY — DX: Left bundle-branch block, unspecified: I44.7

## 2015-02-22 NOTE — Patient Outreach (Signed)
High Risk List Outreach:  Placed call to patient who identified himself.  I explained [place] program and benefits.  Patient expressed interest in program.Patient reports that he is struggling with his breathing at this time. Confirmed address.   Discussed time for initial home visit.    Appointment made for Wednesday  June 29th.  Tomasa Rand, RN, BSN, CEN [facility] [employer] [phone]

## 2015-02-23 DIAGNOSIS — I5032 Chronic diastolic (congestive) heart failure: Secondary | ICD-10-CM | POA: Diagnosis not present

## 2015-02-23 DIAGNOSIS — I482 Chronic atrial fibrillation: Secondary | ICD-10-CM | POA: Diagnosis not present

## 2015-02-23 DIAGNOSIS — I2510 Atherosclerotic heart disease of native coronary artery without angina pectoris: Secondary | ICD-10-CM | POA: Diagnosis not present

## 2015-02-23 DIAGNOSIS — Z7901 Long term (current) use of anticoagulants: Secondary | ICD-10-CM | POA: Diagnosis not present

## 2015-02-27 ENCOUNTER — Telehealth: Payer: Self-pay | Admitting: Critical Care Medicine

## 2015-02-27 ENCOUNTER — Ambulatory Visit (INDEPENDENT_AMBULATORY_CARE_PROVIDER_SITE_OTHER): Payer: Self-pay | Admitting: Critical Care Medicine

## 2015-02-27 ENCOUNTER — Encounter: Payer: Self-pay | Admitting: Critical Care Medicine

## 2015-02-27 VITALS — BP 118/62 | HR 103 | Temp 99.2°F | Ht 67.0 in | Wt 247.8 lb

## 2015-02-27 DIAGNOSIS — J8489 Other specified interstitial pulmonary diseases: Secondary | ICD-10-CM | POA: Diagnosis not present

## 2015-02-27 MED ORDER — INSULIN PUMP: 1.0000 | SUBCUTANEOUS | Status: DC

## 2015-02-27 MED ORDER — PREDNISONE 10 MG PO TABS: 30.0000 mg | ORAL_TABLET | Freq: Every day | ORAL | Status: DC

## 2015-02-27 NOTE — Telephone Encounter (Signed)
Spoke with Clair Gulling with [facility].  Pt was given 5 vials (5,000 units) of insulin on 6/11 and reported to Pharm he is out of insulin.   For 1 vial (1,000 units) of insulin at 0.8 units units every hour per insulin pump would be 19.2 units in 24 hour period and should last pt approx 50 days. Clair Gulling reports pt could not give any clarification as to how much insulin he is currently using per hour and would like rx clarified before filling.  Advised I would discuss with pt and would call Jim back.  He verbalized understanding.  Called, spoke with pt.  He is requesting I call back in approx 10 minutes.

## 2015-02-27 NOTE — Telephone Encounter (Signed)
Pharmacy called and said that the Insulin prescription can not be right.  He says that patient has been using 5000 units within 17 days.  He says that the .8 units does not match up.   To Crystal - please follow up on this.

## 2015-02-27 NOTE — Telephone Encounter (Signed)
Spoke with pt.  Pt states he checks his blood sugar 4 x daily.   States since he has received a new insulin pump, it has not been set.  It was issued through [employer]; pt has not contacted them to let them know it has not been set and states PCP office doesn't set it correctly either.  Pt states he figures out how much insulin he will use according to blood sugar but doesn't have a sliding scale or a formula to use to figure this out.  He does this by himself by "guessing at what sounds good."   Yesterday Blood sugars were: 153, 92, 135, 257.  Pt did not know how much insulin he took yesterday. This morning blood sugar 108; pt did not take insulin.  Today at lunch it was 246.  Pt states he took 8.1 units.  "A little while ago" it was 287 and pt reports he took 10.1 units.   Pt states he has 4 vials (the size that goes into pump with 5mL in each vial).  This is all of the insulin he has left from the 5 - 10 mL vials received on 6/11.  Dr. [name], pharmacy would like clarification on how much insulin pt should be taking.  Pharm and pt aware this will be addressed tomorrow.

## 2015-02-27 NOTE — Progress Notes (Signed)
Subjective:    Patient ID: Jon [name], male    DOB: [birth year], 64 y.o.   MRN: [id number]  HPI 02/27/2015 Chief Complaint  Patient presents with  . Follow-up    Feeling the same,still coughing up blood occass. (quarter-sized) every other day,sob same,wheezing,midchest tightness occass. no fcs.Still taking Prednisone twice a day. Insulin running low due to sugars increased with Prednisone.Blisters on left leg since weather hot.    Still coughing up blood, not as much as before. Pure blood , no mucus.  Seldom has mucus alone. No real chest pain. Noting blisters on leg andspreading.  Insulin dose increased and then developed lower sugars, had to increase d/t steroid use .   Pump had to increase to 0.8,  0.5 was basal dose.   Pt denies any significant sore throat, nasal congestion or excess secretions, fever, chills, sweats, unintended weight loss, pleurtic or exertional chest pain, orthopnea PND, or leg swelling Pt denies any increase in rescue therapy over baseline, denies waking up needing it or having any early am or nocturnal exacerbations of coughing/wheezing/or dyspnea. Pt also denies any obvious fluctuation in symptoms with  weather or environmental change or other alleviating or aggravating factors   Current Medications, Allergies, Complete Past Medical History, Past Surgical History, Family History, and Social History were reviewed in [place] record per todays encounter:  02/27/2015   Review of Systems  Constitutional: Negative.   HENT: Negative.  Negative for ear pain, postnasal drip, rhinorrhea, sinus pressure, sore throat, trouble swallowing and voice change.   Eyes: Negative.   Respiratory: Positive for cough, shortness of breath and wheezing. Negative for apnea, choking, chest tightness and stridor.   Cardiovascular: Negative.  Negative for chest pain, palpitations and leg swelling.  Gastrointestinal: Negative.  Negative for nausea, vomiting,  abdominal pain and abdominal distention.  Genitourinary: Negative.   Musculoskeletal: Negative.  Negative for myalgias and arthralgias.  Skin: Negative.  Negative for rash.  Allergic/Immunologic: Negative.  Negative for environmental allergies and food allergies.  Neurological: Negative.  Negative for dizziness, syncope, weakness and headaches.  Hematological: Negative.  Negative for adenopathy. Does not bruise/bleed easily.  Psychiatric/Behavioral: Negative.  Negative for sleep disturbance and agitation. The patient is not nervous/anxious.        Objective:   Physical Exam Filed Vitals:   02/27/15 0850  BP: 118/62  Pulse: 103  Temp: 99.2 F (37.3 C)  TempSrc: Oral  Height: 5\' 7"  (1.702 m)  SpO2: 93%    Gen: Pleasant, well-nourished, in no distress,  normal affect  ENT: No lesions,  mouth clear,  oropharynx clear, no postnasal drip  Neck: No JVD, no TMG, no carotid bruits  Lungs: No use of accessory muscles, no dullness to percussion, expired wheezes distant breath sounds   Cardiovascular: RRR, heart sounds normal, no murmur or gallops, no peripheral edema  Abdomen: soft and NT, no HSM,  BS normal  Musculoskeletal: No deformities, no cyanosis or clubbing  Neuro: alert, non focal  Skin: Dressing on left lower extremity No results found.        Assessment & Plan:  I personally reviewed all images and lab data in the [facility] system as well as any outside material available during this office visit and agree with the  radiology impressions.   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  Diagnoses and all orders for this visit:  BOOP (bronchiolitis  obliterans with organizing pneumonia)  Other orders -     predniSONE (DELTASONE) 10 MG tablet; Take 3 tablets (30 mg total) by mouth daily with breakfast. -     Insulin Human (INSULIN PUMP) SOLN; Inject 1 each into the skin continuous. Inject 0.8 units of novolog every hour per insulin pump.  Dose Increased due to chronic steroid use    I had an extended discussion with the patient and or family lasting 10 minutes of a 25 minute visit including:  Disease state, treatment options, need for increased steroid

## 2015-02-27 NOTE — Patient Instructions (Signed)
We will get you more insulin for the pump Increase prednisone to 30mg  daily Return 1 month

## 2015-02-28 ENCOUNTER — Other Ambulatory Visit: Payer: Self-pay

## 2015-02-28 ENCOUNTER — Telehealth: Payer: Self-pay | Admitting: Critical Care Medicine

## 2015-02-28 VITALS — BP 128/62 | HR 71 | Resp 20 | Ht 67.0 in | Wt 246.0 lb

## 2015-02-28 DIAGNOSIS — L97921 Non-pressure chronic ulcer of unspecified part of left lower leg limited to breakdown of skin: Secondary | ICD-10-CM | POA: Diagnosis not present

## 2015-02-28 DIAGNOSIS — E1142 Type 2 diabetes mellitus with diabetic polyneuropathy: Secondary | ICD-10-CM | POA: Diagnosis not present

## 2015-02-28 DIAGNOSIS — E118 Type 2 diabetes mellitus with unspecified complications: Secondary | ICD-10-CM

## 2015-02-28 NOTE — Telephone Encounter (Signed)
Late Add:  Spoke with Hoyle Sauer with Dr. Alyse Low office this am.  Discussed below.  States she will speak with Dr. Tobie Poet and will contact pt with additional information.  I spoke with Juanda Crumble with [facility] and discussed above and asked to please void the insulin rx from [facility].  He verbalized understanding.  Spoke with pt and provided update.  He verbalized understanding.

## 2015-02-28 NOTE — Patient Outreach (Signed)
[facility]) Care Management  02/28/2015  AYYAN SISON 1951-07-29 [id number]   Notification from Tomasa Rand, RN to assign SW, assigned [employer], [place].  [name]. [place], [place] Management [place] Assistant Phone: [phone] Fax: [phone]

## 2015-02-28 NOTE — Assessment & Plan Note (Signed)
Bronchiolitis obliterans organized pneumonia with ongoing flare The patient worsens when steroid  drop below 30 mg daily Significant hyperglycemia on increased steroid dose Plan Obtain for patient increase dosing of insulin Increase prednisone to 30 mg daily

## 2015-02-28 NOTE — Patient Outreach (Signed)
Follow up care coordination call: Spoke with patient who states MD is doing a referral for the wound center for leg wounds.  Patient states that MD does not want to take patient off pump. Patient reports that pump was reset today by MD.  Patient reports that he needs assistance from [employer] to help him understand how to program in carb ratios.  I will contact [employer] to inquire about representative that patient is assigned to and ask for them to contact patient.   Tomasa Rand, RN, BSN, CEN [facility] [employer] [phone]

## 2015-02-28 NOTE — Telephone Encounter (Signed)
Unfortunately, I cannot manage his insulin dosing. Would prefer pcp manage this

## 2015-02-28 NOTE — Telephone Encounter (Signed)
Spoke with [facility]. Was told by the pharmacists he had already spoken with [place] regarding this and is aware PCP will be taking care of this. Nothing further needed

## 2015-02-28 NOTE — Telephone Encounter (Signed)
Spoke with Hoyle Sauer from Dr. Alyse Low office. Pt is scheduled to come in and see them today on how to use the insulin and make sure he understands everything. FYI for our office.

## 2015-02-28 NOTE — Patient Outreach (Signed)
Call coordination: Placed call to [place] to discuss concerns noted during home visit today. Spoke with Hoyle Sauer. Reports problems with insulin pump and concern about wounds on legs.  Informed MD office that patient wishes to come off pump and start daily insulin injections. Hoyle Sauer reports that she will inform MD prior to patients office visit at 2:15pm today.  I provided my contact number and requested a call back. If insulin is not changed then will need to reach [employer] representative to assist patient with pump. Tomasa Rand, RN, BSN, CEN [facility] [employer] [phone]

## 2015-02-28 NOTE — Telephone Encounter (Signed)
ATC [facility]. Pharm is closed. WCB

## 2015-02-28 NOTE — Patient Outreach (Signed)
[facility]) Care Management   02/28/2015  Jon [name] 06-25-1951 [id number]  Jon [name] is an 64 y.o. male Initial home visit at 65 am. Wife Jon [name] present during home visit.  Subjective: Patient reports that he is struggling with his legs and his diabetes. Reports that he does not know how to use his insulin pump. Reports that he had a pump about 3-4 months ago that he could operate. Recently got the minipress  Insulin pump from [employer]. Patient reports to me that he does not know how to adjust for meals.  Reports that he puts in a random number and hopes that it is right. Patient denies any manual or contact person with [employer] to help him.  Patient reports that he has some financial struggles but has applied for assistance in the past and was denied. States that he does not want to go through that again.  Patient states that he does not follow a diet. He reports that he eats what he has.  Patient reports that he is having problems with sores on his legs. Reports that he develops water blisters and then those blisters turn into sores.  Reports problems with constipation.  Currently patient weighs occasionally but does not record. Also does not weigh at the same time every day. Patient also mentions that he can not get his meds all filled at the same time. Reports going to the pharmacy multiple times per week to get medications.  Currently patient is able to drive without difficulty. Uses a cane when out of the home.   Objective:   Filed Vitals:   02/28/15 1018  BP: 128/62  Pulse: 71  Resp: 20  Height: 1.702 m (5\' 7" )  Weight: 246 lb (111.585 kg)  SpO2: 97%   Awake and alert.  Ambulating in the home without problems. Reviewed CBG log and noted that patient is checking CBG 3-4 times per day.   Review of Systems  Skin:       Reports leg wounds.    Physical Exam  Constitutional: He is oriented to person, place, and time. He appears well-developed and  well-nourished.  Cardiovascular: Normal rate and intact distal pulses.   Irregular rhythm.  Respiratory: Effort normal and breath sounds normal. No respiratory distress.  GI: Soft. Bowel sounds are normal.  Musculoskeletal: He exhibits edema.  Neurological: He is alert and oriented to person, place, and time.  Skin: Skin is warm and dry.  Right leg with open area to the shin.  2 small area open. No drainage noted. Right lower leg discolored. Skin is warm. Pulses present.  1 plus edema to the right leg.  Left leg with 6 small open areas to the shin.   No drainage.  Skin is discolored. Skin is warm to the touch.  Pulses present.  1 plus edema note.  Larger open skin noted to the left calf. 2.5 by 2 inch area. No drainage to this area.   Patient reports using neosporin and gauze to treat.  Psychiatric: He has a normal mood and affect. His behavior is normal. Judgment and thought content normal.    Current Medications:   Current Outpatient Prescriptions  Medication Sig Dispense Refill  . albuterol (PROVENTIL HFA;VENTOLIN HFA) 108 (90 BASE) MCG/ACT inhaler Inhale 2 puffs into the lungs every 6 (six) hours as needed for wheezing or shortness of breath.    Marland Kitchen albuterol (PROVENTIL) (2.5 MG/3ML) 0.083% nebulizer solution Take 2.5 mg by nebulization 2 (two) times daily.  And as needed    . atorvastatin (LIPITOR) 40 MG tablet Take 40 mg by mouth daily.    Marland Kitchen azelastine (ASTELIN) 137 MCG/SPRAY nasal spray Place 1 spray into both nostrils at bedtime as needed for allergies.     . benzonatate (TESSALON) 100 MG capsule Take 100 mg by mouth as needed.    . diltiazem (CARDIZEM) 30 MG tablet Take 30 mg by mouth 2 (two) times daily.    . famotidine (PEPCID) 20 MG tablet TAKE 1 TABLET BY MOUTH AT BEDTIME 30 tablet 6  . fluticasone (FLONASE) 50 MCG/ACT nasal spray Place 1 spray into both nostrils daily as needed for allergies.     . Fluticasone-Salmeterol (ADVAIR) 500-50 MCG/DOSE AEPB Inhale 1 puff into the lungs  every 12 (twelve) hours. 60 each 11  . hydrALAZINE (APRESOLINE) 50 MG tablet Take 50 mg by mouth. Take 1 tablet in am and 2 tablets at night by mouth    . Insulin Human (INSULIN PUMP) SOLN Inject 1 each into the skin continuous. Inject 0.8 units of novolog every hour per insulin pump.  Dose Increased due to chronic steroid use 1 each 11  . ipratropium (ATROVENT) 0.02 % nebulizer solution Take 0.5 mg by nebulization 4 (four) times daily as needed for wheezing or shortness of breath.    . isosorbide mononitrate (IMDUR) 60 MG 24 hr tablet Take 60 mg by mouth every morning.    . latanoprost (XALATAN) 0.005 % ophthalmic solution Place 1 drop into both eyes at bedtime.    Marland Kitchen losartan (COZAAR) 100 MG tablet Take 1 tablet (100 mg total) by mouth daily. 30 tablet 11  . magnesium oxide (MAG-OX) 400 MG tablet Take 1,600 mg by mouth daily.     Marland Kitchen MOVANTIK 25 MG TABS 25 mg. Take 1 tablet by mouth daily    . Multiple Vitamins-Minerals (CENTRUM SILVER PO) Take 1 tablet by mouth daily.    . nitroGLYCERIN (NITROSTAT) 0.4 MG SL tablet Place 0.4 mg under the tongue every 5 (five) minutes as needed. For chest pain    . OXYCONTIN 10 MG T12A 12 hr tablet Take 10 mg by mouth 2 (two) times daily.    . pantoprazole (PROTONIX) 40 MG tablet Take 40 mg by mouth 2 (two) times daily.    . potassium chloride SA (K-DUR,KLOR-CON) 20 MEQ tablet Take 20 mEq by mouth 3 (three) times daily.    . predniSONE (DELTASONE) 10 MG tablet Take 3 tablets (30 mg total) by mouth daily with breakfast. 60 tablet 6  . pregabalin (LYRICA) 75 MG capsule Take 1 capsule (75 mg total) by mouth 2 (two) times daily. 60 capsule 0  . Rivaroxaban (XARELTO) 15 MG TABS tablet Take 15 mg by mouth daily.    Marland Kitchen torsemide (DEMADEX) 20 MG tablet Take 20 mg by mouth 2 (two) times daily.    Marland Kitchen dexlansoprazole (DEXILANT) 60 MG capsule Take 60 mg by mouth daily.    . metolazone (ZAROXOLYN) 2.5 MG tablet Take 2.5 mg by mouth daily as needed. For dizzy    . Polyethyl  Glycol-Propyl Glycol (SYSTANE PRESERVATIVE FREE) 0.4-0.3 % SOLN Apply 1 drop to eye 4 (four) times daily as needed (dryness of eye).     . pramipexole (MIRAPEX) 1.5 MG tablet Take 1.5 mg by mouth at bedtime.     No current facility-administered medications for this visit.    Functional Status:   In your present state of health, do you have any difficulty performing the following activities: 02/28/2015  Hearing? N  Vision? Y  Difficulty concentrating or making decisions? N  Walking or climbing stairs? N  Dressing or bathing? N  Doing errands, shopping? N  Preparing Food and eating ? N  Using the Toilet? N  In the past six months, have you accidently leaked urine? N  Do you have problems with loss of bowel control? N  Managing your Medications? N  Managing your Finances? N  Housekeeping or managing your Housekeeping? N    Fall/Depression Screening:    PHQ 2/9 Scores 02/28/2015  PHQ - 2 Score 0    Assessment:   (1) explained THN program. Consent obtained. THN new patient packet provided to patient. [facility] calendar provided. Contact information reviewed. (2) Patient does not have advance directives. (3) Patient reports that he can not use his insulin pump correctly. Patient request to change insulin back to injectables.  Patient is not following his diabetic diet. (4) Open wounds to both legs (5) Not current weighing daily. (6) Patient reports struggling with getting all meds refilled at the same time vs having to return to pharmacy 2-3 times per week.   Plan:  (1) THN consent obtained and placed in chart. (2) referral to [facility] social worker to assist with completion of advanced directives. (3) Patient to discuss with MD his wishes about insulin delivery. I placed call to MD office to update them prior to patients office visit today. Provided DM diet information to patient today in written and verbal form. EMMI. (4) Patient to discuss the option of the wound center to assist with open  leg wounds. MD office notified of wounds. Will also send this report. (5) Patient will weigh daily and record. Discussed with patient the importance of weighing first thing in the mornings daily and recording weight. Also discussed importance of notifying MD for weight gain greater than 3 pounds over night or 5 pounds in a week.  (6) Spoke with [facility] and they report that they have gotten patient on a schedule to pick up meds in the middle of the month (13th)  Will reassess progress toward goals at the next home visit planned for July 27th   Collaborative goal setting with patient during home visit and patients number 1 goal is to get DM/ insulin delivery changed and get better control of CBG range.  Note: CBG supplies are from [place], Oxygen and CPAP supplies through [place].   [facility] CM Care Plan Problem One        Patient Outreach from 02/28/2015 in [place] Problem One  Knowledge deficit related to self management of diabetes.   Care Plan for Problem One  Active   THN Long Term Goal (31-90 days)  Patient will be able to report feeling more in control of his CBG range in the next 60 days.   THN Long Term Goal Start Date  02/28/15   Interventions for Problem One Long Term Goal   low blood sugar self care, controlling blood sugar, diabetes diet.  Reviewed patients home CBG log,  Placed call to MD office and discussed concerns with staff.   THN CM Short Term Goal #1 (0-30 days)  Patient will record CBG readings daily for the next 30 days.   THN CM Short Term Goal #1 Start Date  02/28/15   Interventions for Short Term Goal #1  Discussed importance of record keeping. discussed signs and symptoms of hypoglycemia, Encouraged patient to discuss his concerns with MD during office  visit.   THN CM Short Term Goal #2 (0-30 days)  Patient will be able to reports reviewing diabetic diet samples provided in the next 30 days.   THN CM Short Term Goal #2 Start Date   02/28/15   Interventions for Short Term Goal #2  Patient is undecided about his ablilty to change his diet,  Reviewed simple foods to avoid like bread, pasta, potatoes, rice and sweets.    [facility] CM Care Plan Problem Two        Patient Outreach from 02/28/2015 in [place] for Problem Two  Active   Interventions for Problem Two Long Term Goal   reported legs wounds to MD office Hoyle Sauer), encouraged patient to discuss options like the wound center with MD during next office visit which is today. Reviewed signs of infection.   THN Long Term Goal (31-90) days  Patient will be able to report increase healing in legs in the next 60 days.   THN Long Term Goal Start Date  02/28/15   THN CM Short Term Goal #1 (0-30 days)  Patient will report improvement of healing to legs wounds in the next 30 days.   THN CM Short Term Goal #1 Start Date  02/28/15   Interventions for Short Term Goal #2   Discussed with patient the importance of following the recommendations from MD today. Discussed importance of low salt and leg elevation to decrease swelling   THN CM Short Term Goal #2 (0-30 days)  Patient will be able to verbalize reasons to call MD for skin concerns in the next 30 days.   THN CM Short Term Goal #2 Start Date  02/28/15   Interventions for Short Term Goal #2  Discussed importance of checking feet daily and observing wounds on legs and reporting any changes to MD.    Tomasa Rand, RN, BSN, CEN [place] Coordinator [phone]

## 2015-02-28 NOTE — Telephone Encounter (Signed)
noted 

## 2015-03-01 ENCOUNTER — Other Ambulatory Visit: Payer: Self-pay

## 2015-03-01 NOTE — Patient Outreach (Signed)
Care Coordination: Spoke with patient about [employer] representative. Informed patient that at representative would be calling him. Also provided patient with name of representative and contact phone number.  Janit Pagan [phone].  Also provided patient with the 24 hour contact number for problems with pump.   [phone]   Option 1.  Patient verbalized understanding of the above plan and phone numbers. Patient able to read back correct phone numbers.  Patient informed me that he is going to the wound clinic ton 7/1  At 3pm.  Will continue to follow up with patient and reassess needs.  Tomasa Rand, RN, BSN, CEN [facility] [employer] [phone]

## 2015-03-01 NOTE — Patient Outreach (Signed)
Care Coordination: Placed call to [employer]. Spoke with [place]. Reports that patients [employer] representative is Janit Pagan [phone]. Reports that he will send a message to have representative call patient to schedule time for a training session.  I have called patient to attempt to update. Calls x 2 unsuccessful. Will attempt again.  Tomasa Rand, RN, BSN, CEN [facility] [employer] [phone]

## 2015-03-02 DIAGNOSIS — E119 Type 2 diabetes mellitus without complications: Secondary | ICD-10-CM | POA: Diagnosis not present

## 2015-03-02 DIAGNOSIS — I739 Peripheral vascular disease, unspecified: Secondary | ICD-10-CM | POA: Diagnosis not present

## 2015-03-02 DIAGNOSIS — L97811 Non-pressure chronic ulcer of other part of right lower leg limited to breakdown of skin: Secondary | ICD-10-CM | POA: Diagnosis not present

## 2015-03-02 DIAGNOSIS — M1990 Unspecified osteoarthritis, unspecified site: Secondary | ICD-10-CM | POA: Diagnosis not present

## 2015-03-02 DIAGNOSIS — I872 Venous insufficiency (chronic) (peripheral): Secondary | ICD-10-CM | POA: Diagnosis not present

## 2015-03-02 DIAGNOSIS — I4891 Unspecified atrial fibrillation: Secondary | ICD-10-CM | POA: Diagnosis not present

## 2015-03-02 DIAGNOSIS — I2510 Atherosclerotic heart disease of native coronary artery without angina pectoris: Secondary | ICD-10-CM | POA: Diagnosis not present

## 2015-03-02 DIAGNOSIS — Z87891 Personal history of nicotine dependence: Secondary | ICD-10-CM | POA: Diagnosis not present

## 2015-03-02 DIAGNOSIS — L97821 Non-pressure chronic ulcer of other part of left lower leg limited to breakdown of skin: Secondary | ICD-10-CM | POA: Diagnosis not present

## 2015-03-02 DIAGNOSIS — E11622 Type 2 diabetes mellitus with other skin ulcer: Secondary | ICD-10-CM | POA: Diagnosis not present

## 2015-03-02 DIAGNOSIS — G629 Polyneuropathy, unspecified: Secondary | ICD-10-CM | POA: Diagnosis not present

## 2015-03-02 DIAGNOSIS — I10 Essential (primary) hypertension: Secondary | ICD-10-CM | POA: Diagnosis not present

## 2015-03-02 DIAGNOSIS — I509 Heart failure, unspecified: Secondary | ICD-10-CM | POA: Diagnosis not present

## 2015-03-02 DIAGNOSIS — J449 Chronic obstructive pulmonary disease, unspecified: Secondary | ICD-10-CM | POA: Diagnosis not present

## 2015-03-02 DIAGNOSIS — J45909 Unspecified asthma, uncomplicated: Secondary | ICD-10-CM | POA: Diagnosis not present

## 2015-03-02 DIAGNOSIS — G4730 Sleep apnea, unspecified: Secondary | ICD-10-CM | POA: Diagnosis not present

## 2015-03-02 DIAGNOSIS — Z794 Long term (current) use of insulin: Secondary | ICD-10-CM | POA: Diagnosis not present

## 2015-03-06 ENCOUNTER — Other Ambulatory Visit: Payer: Self-pay | Admitting: *Deleted

## 2015-03-06 NOTE — Patient Outreach (Signed)
[facility]) Care Management  03/06/2015  ZYEN ACKER 11-30-1950 [id number]  CSW received a new referral on patient from Tomasa Rand, [facility] with [place] [facility], encouraging CSW to make contact with patient to assist with completion of Advanced Directives ([place] documents).  Mrs. Lacinda Axon has already provided patient with the Advanced Directives packet, so CSW just needs to schedule a home visit with patient to assist with completion.  CSW made an initial attempt to try and contact patient today to perform phone assessment, as well as assess and assist with social work needs and services, without success.  A HIPAA compliant message was left for patient on voicemail.  CSW is currently awaiting a return call.  [name], BSW, MSW, [place] Management [place], [place], [place] [address] [name] [phone]

## 2015-03-09 DIAGNOSIS — L97811 Non-pressure chronic ulcer of other part of right lower leg limited to breakdown of skin: Secondary | ICD-10-CM | POA: Diagnosis not present

## 2015-03-09 DIAGNOSIS — E11622 Type 2 diabetes mellitus with other skin ulcer: Secondary | ICD-10-CM | POA: Diagnosis not present

## 2015-03-09 DIAGNOSIS — L97821 Non-pressure chronic ulcer of other part of left lower leg limited to breakdown of skin: Secondary | ICD-10-CM | POA: Diagnosis not present

## 2015-03-09 DIAGNOSIS — I872 Venous insufficiency (chronic) (peripheral): Secondary | ICD-10-CM | POA: Diagnosis not present

## 2015-03-09 DIAGNOSIS — J45909 Unspecified asthma, uncomplicated: Secondary | ICD-10-CM | POA: Diagnosis not present

## 2015-03-12 ENCOUNTER — Other Ambulatory Visit: Payer: Self-pay | Admitting: *Deleted

## 2015-03-12 NOTE — Patient Outreach (Signed)
[facility] [facility]) Care Management  03/12/2015  Jon [name] [birth year] [id number]   CSW made a second attempt to try and contact patient today to perform the initial phone assessment, as well as assess and assist with social work needs and services, without success.  A HIPAA compliant message was left for patient on voicemail.  CSW is currently awaiting a return call.  Drusilla Kanner, MSW, [facility] [place], [place], [place] [address] [name] [phone]

## 2015-03-13 ENCOUNTER — Other Ambulatory Visit: Payer: Self-pay | Admitting: *Deleted

## 2015-03-13 ENCOUNTER — Encounter: Payer: Self-pay | Admitting: *Deleted

## 2015-03-13 NOTE — Patient Outreach (Signed)
[facility]) Care Management  [facility] Social Work  03/13/2015  KHALEEL BECKOM 04-04-1951 [id number]   Current Medications:  Current Outpatient Prescriptions  Medication Sig Dispense Refill  . albuterol (PROVENTIL HFA;VENTOLIN HFA) 108 (90 BASE) MCG/ACT inhaler Inhale 2 puffs into the lungs every 6 (six) hours as needed for wheezing or shortness of breath.    Marland Kitchen albuterol (PROVENTIL) (2.5 MG/3ML) 0.083% nebulizer solution Take 2.5 mg by nebulization 2 (two) times daily. And as needed    . atorvastatin (LIPITOR) 40 MG tablet Take 40 mg by mouth daily.    Marland Kitchen azelastine (ASTELIN) 137 MCG/SPRAY nasal spray Place 1 spray into both nostrils at bedtime as needed for allergies.     . benzonatate (TESSALON) 100 MG capsule Take 100 mg by mouth as needed.    Marland Kitchen dexlansoprazole (DEXILANT) 60 MG capsule Take 60 mg by mouth daily.    Marland Kitchen diltiazem (CARDIZEM) 30 MG tablet Take 30 mg by mouth 2 (two) times daily.    . famotidine (PEPCID) 20 MG tablet TAKE 1 TABLET BY MOUTH AT BEDTIME 30 tablet 6  . fluticasone (FLONASE) 50 MCG/ACT nasal spray Place 1 spray into both nostrils daily as needed for allergies.     . Fluticasone-Salmeterol (ADVAIR) 500-50 MCG/DOSE AEPB Inhale 1 puff into the lungs every 12 (twelve) hours. 60 each 11  . hydrALAZINE (APRESOLINE) 50 MG tablet Take 50 mg by mouth. Take 1 tablet in am and 2 tablets at night by mouth    . Insulin Human (INSULIN PUMP) SOLN Inject 1 each into the skin continuous. Inject 0.8 units of novolog every hour per insulin pump.  Dose Increased due to chronic steroid use 1 each 11  . ipratropium (ATROVENT) 0.02 % nebulizer solution Take 0.5 mg by nebulization 4 (four) times daily as needed for wheezing or shortness of breath.    . isosorbide mononitrate (IMDUR) 60 MG 24 hr tablet Take 60 mg by mouth every morning.    . latanoprost (XALATAN) 0.005 % ophthalmic solution Place 1 drop into both eyes at bedtime.    Marland Kitchen losartan (COZAAR) 100 MG tablet Take 1  tablet (100 mg total) by mouth daily. 30 tablet 11  . magnesium oxide (MAG-OX) 400 MG tablet Take 1,600 mg by mouth daily.     . metolazone (ZAROXOLYN) 2.5 MG tablet Take 2.5 mg by mouth daily as needed. For dizzy    . MOVANTIK 25 MG TABS 25 mg. Take 1 tablet by mouth daily    . Multiple Vitamins-Minerals (CENTRUM SILVER PO) Take 1 tablet by mouth daily.    . nitroGLYCERIN (NITROSTAT) 0.4 MG SL tablet Place 0.4 mg under the tongue every 5 (five) minutes as needed. For chest pain    . OXYCONTIN 10 MG T12A 12 hr tablet Take 10 mg by mouth 2 (two) times daily.    . pantoprazole (PROTONIX) 40 MG tablet Take 40 mg by mouth 2 (two) times daily.    Vladimir Faster Glycol-Propyl Glycol (SYSTANE PRESERVATIVE FREE) 0.4-0.3 % SOLN Apply 1 drop to eye 4 (four) times daily as needed (dryness of eye).     . potassium chloride SA (K-DUR,KLOR-CON) 20 MEQ tablet Take 20 mEq by mouth 3 (three) times daily.    . pramipexole (MIRAPEX) 1.5 MG tablet Take 1.5 mg by mouth at bedtime.    . predniSONE (DELTASONE) 10 MG tablet Take 3 tablets (30 mg total) by mouth daily with breakfast. 60 tablet 6  . pregabalin (LYRICA) 75 MG capsule Take 1  capsule (75 mg total) by mouth 2 (two) times daily. 60 capsule 0  . Rivaroxaban (XARELTO) 15 MG TABS tablet Take 15 mg by mouth daily.    Marland Kitchen torsemide (DEMADEX) 20 MG tablet Take 20 mg by mouth 2 (two) times daily.     No current facility-administered medications for this visit.    Functional Status:  In your present state of health, do you have any difficulty performing the following activities: 02/28/2015  Hearing? N  Vision? Y  Difficulty concentrating or making decisions? N  Walking or climbing stairs? N  Dressing or bathing? N  Doing errands, shopping? N  Preparing Food and eating ? N  Using the Toilet? N  In the past six months, have you accidently leaked urine? N  Do you have problems with loss of bowel control? N  Managing your Medications? N  Managing your Finances? N   Housekeeping or managing your Housekeeping? N    Fall/Depression Screening:  PHQ 2/9 Scores 02/28/2015  PHQ - 2 Score 0    Assessment:   CSW was able to make initial contact with patient today to perform phone assessment, as well as assess and assist with social work needs and services.  CSW introduced self, explained role and types of services provided through [employer].  CSW further reported to patient that CSW works with Tomasa Rand, patient's RNCM with [place] [facility].  Last, CSW indicated to patient that Mrs. Lacinda Axon was the referral source, requesting that [place] assist patient with completion of his Advanced Directives ([place] documents).  Mrs. Lacinda Axon reported that she was able to leave an Advanced Directives packet with patient when she met with him for the initial home visit on June 29th.  Two HIPAA compliant identifiers were obtained from patient (name and date of birth), and patient provided CSW verbal consent to converse with CSW regarding his Advanced Directives.    Patient admitted that he is still interested in completing the documents; although, he has not yet had an opportunity to review the contents.  CSW encouraged patient to look over the forms with his wife, Nassim Cosma so that he can be prepared to fill out the forms when he meets with CSW for the initial home visit.  Patient voiced understanding, agreeing to meet with CSW on Wednesday, July 20th at 11:00am to complete the documents.  CSW inquired as to how CSW could be of further assistance to patient at this time.  Patient denied being able to identify any additional social work needs at present.  CSW provided patient with CSW's contact information, encouraging patient to contact CSW directly if social work needs arise in the meantime.  Patient voiced understanding and was agreeable to this plan.  Plan:  CSW will meet with patient for the  initial home visit on Wednesday, July 20th at 11:00am to assist with completion of Advanced Directives ([place] documents). CSW will converse with Tomasa Rand, RNCM with [facility] [facility], to report findings of initial phone conversation with patient today. CSW will fax a correspondence letter to patient's Primary Care Physician, Dr. Rochel Brome to ensure that Dr. Tobie Poet is aware of CSW's involvement with patient.  [name], BSW, MSW, [place] [facility] [place], [place], [place] [address] [name] [phone]

## 2015-03-15 DIAGNOSIS — I872 Venous insufficiency (chronic) (peripheral): Secondary | ICD-10-CM | POA: Diagnosis not present

## 2015-03-15 DIAGNOSIS — Z09 Encounter for follow-up examination after completed treatment for conditions other than malignant neoplasm: Secondary | ICD-10-CM | POA: Diagnosis not present

## 2015-03-15 DIAGNOSIS — E119 Type 2 diabetes mellitus without complications: Secondary | ICD-10-CM | POA: Diagnosis not present

## 2015-03-15 DIAGNOSIS — Z872 Personal history of diseases of the skin and subcutaneous tissue: Secondary | ICD-10-CM | POA: Diagnosis not present

## 2015-03-15 DIAGNOSIS — J45909 Unspecified asthma, uncomplicated: Secondary | ICD-10-CM | POA: Diagnosis not present

## 2015-03-16 ENCOUNTER — Ambulatory Visit: Payer: Medicare Other | Admitting: *Deleted

## 2015-03-21 ENCOUNTER — Other Ambulatory Visit: Payer: Self-pay | Admitting: *Deleted

## 2015-03-22 ENCOUNTER — Other Ambulatory Visit: Payer: Self-pay | Admitting: *Deleted

## 2015-03-22 ENCOUNTER — Ambulatory Visit: Payer: Medicare Other | Admitting: Critical Care Medicine

## 2015-03-22 NOTE — Patient Outreach (Signed)
[facility]) Care Management  03/22/2015  Jon [name] February 11, 1951 [id number]   CSW was scheduled to meet with patient today (Wednesday, July 20th) at 11:00am to perform the initial home visit, as well as assist with completion of Advanced Directives ([place] documents); however, no one was available at the time of CSW's arrival.  CSW knocked several times on patient's door, but there was no answer.  CSW also tried calling patient on his home number [phone]), as well as his cell number [phone]), but there was no answer.  CSW was able to leave HIPAA compliant messages on both numbers.  CSW will await a return call to try and reschedule the initial home visit.    [name], BSW, MSW, [place] [facility] [place], [place], [place] [address] [name] [phone]

## 2015-03-22 NOTE — Patient Outreach (Signed)
[place]) Care Management  03/22/2015  LEMUEL KREINER 11-02-1950 [id number]   CSW made a second attempt to try and contact patient today to reschedule the initial home visit, without success.  A HIPAA compliant message was left for patient via his wife, Greyden Cumbo, who reported that she will give patient the message and have him return CSW's call at his earliest convenience.  CSW will await a return call.  [name], BSW, MSW, [place] [facility] [place], [place], [place] [address] [name] [phone]

## 2015-03-26 ENCOUNTER — Other Ambulatory Visit: Payer: Self-pay | Admitting: *Deleted

## 2015-03-26 ENCOUNTER — Encounter: Payer: Self-pay | Admitting: *Deleted

## 2015-03-26 DIAGNOSIS — I83009 Varicose veins of unspecified lower extremity with ulcer of unspecified site: Secondary | ICD-10-CM | POA: Diagnosis not present

## 2015-03-26 DIAGNOSIS — I83028 Varicose veins of left lower extremity with ulcer other part of lower leg: Secondary | ICD-10-CM | POA: Diagnosis not present

## 2015-03-26 DIAGNOSIS — I872 Venous insufficiency (chronic) (peripheral): Secondary | ICD-10-CM | POA: Diagnosis not present

## 2015-03-26 DIAGNOSIS — I83018 Varicose veins of right lower extremity with ulcer other part of lower leg: Secondary | ICD-10-CM | POA: Diagnosis not present

## 2015-03-26 DIAGNOSIS — L97929 Non-pressure chronic ulcer of unspecified part of left lower leg with unspecified severity: Secondary | ICD-10-CM | POA: Diagnosis not present

## 2015-03-26 DIAGNOSIS — L97919 Non-pressure chronic ulcer of unspecified part of right lower leg with unspecified severity: Secondary | ICD-10-CM | POA: Diagnosis not present

## 2015-03-26 NOTE — Patient Outreach (Signed)
[place] [facility]) Care Management  03/26/2015  CIARAN SCHWARZKOPF June 11, 1951 [id number]  [place] made a fourth and final attempt to try and contact patient today to assist with completion of Advanced Directives ([place] documents); however, patient was not available at the time of CSW's call.  CSW was unable to leave a message at patient's home number [phone]) because the phone just continued to ring and ring, with no answer and no voicemail to leave a message.  CSW then tried calling patient on his cell phone number [phone]), but CSW was unable to leave a message because patient "does not have a mailbox set up on the system"  Last, CSW tried calling patient's wife, Melva Cisneros at her work number [phone]) but she was unavailable. CSW left a HIPAA compliant message and is currently awaiting a return call.   In CSW's message to Mr. and Mrs. Kremin, CSW explained that CSW continues to try and contact patient to schedule a home visit to assist patient with completion of his Advanced Directives.  CSW reminded Mr. and Mrs. Dennie that CSW has left four phone messages, in addition to trying to meet them in the home to perform a home visit, in which patient was unavailable.  CSW encouraged patient to return CSW's call at his earliest convenience if patient is interested in receiving social work services through [place] with [facility]. CSW will mail an outreach letter to patient's home today, allowing patient 10 business days to return CSW's call if patient wishes to receive services. Otherwise, CSW will perform a case closure on patient, due to inability to establish phone contact with patient, despite required number of attempts.  [name], BSW, MSW, [place] [facility] [place], [place], [place] [address] [name] [phone]

## 2015-03-27 DIAGNOSIS — L97811 Non-pressure chronic ulcer of other part of right lower leg limited to breakdown of skin: Secondary | ICD-10-CM | POA: Diagnosis not present

## 2015-03-27 DIAGNOSIS — S51801A Unspecified open wound of right forearm, initial encounter: Secondary | ICD-10-CM | POA: Diagnosis not present

## 2015-03-27 DIAGNOSIS — L97822 Non-pressure chronic ulcer of other part of left lower leg with fat layer exposed: Secondary | ICD-10-CM | POA: Diagnosis not present

## 2015-03-27 DIAGNOSIS — L97812 Non-pressure chronic ulcer of other part of right lower leg with fat layer exposed: Secondary | ICD-10-CM | POA: Diagnosis not present

## 2015-03-27 DIAGNOSIS — E11622 Type 2 diabetes mellitus with other skin ulcer: Secondary | ICD-10-CM | POA: Diagnosis not present

## 2015-03-27 DIAGNOSIS — I872 Venous insufficiency (chronic) (peripheral): Secondary | ICD-10-CM | POA: Diagnosis not present

## 2015-03-27 DIAGNOSIS — L97821 Non-pressure chronic ulcer of other part of left lower leg limited to breakdown of skin: Secondary | ICD-10-CM | POA: Diagnosis not present

## 2015-03-27 DIAGNOSIS — J45909 Unspecified asthma, uncomplicated: Secondary | ICD-10-CM | POA: Diagnosis not present

## 2015-03-28 ENCOUNTER — Other Ambulatory Visit: Payer: Self-pay

## 2015-03-28 NOTE — Patient Outreach (Signed)
[facility]) Care Management   03/28/2015  Jon [name] 07-27-1951 [id number]  Jon [name] is an 64 y.o. male Arrived for home visit at 10am. Wife present for home visit Subjective:  Patient reports that he is doing some better. States that he goes to the wound clinic one a week ( usually on Tuesdays, reports he went yesterday).  Patient reports that wounds are healing.  Patient reports that he had an appointment with the [employer] representative about his insulin pump. Reports that he could not met the representative at [employer] because he had a doctors appointment.  Reports that he has not heard back from the representative.  Patient reports that he is managing the best that he can with this pump.  Reports that primary MD does not want him to change back to injections.  Patient does report improvement in getting his medications filled in the middle of the month.  Patient reports that he is having "vein circulation" issues and is waiting to have surgery on his leg to "open the veins".   Patient reports that he continues to check CBG 3-4 times per day. Reports that he weighs daily but has not been recording daily. Patient reports that he has read the material provided about Diabetic diets.  Patient feels that his control of blood sugar has improved a small amount.  Patient admits that he has been coughing up blood for the last few weeks. States bloody sputum. Denies clots.  Reports that he has an appointment with Jon [name] next week (04/03/2015). Next follow up with Jon [name] on 04/23/2015. Denies any bloody sputum yesterday or today.  Objective:  Awake and alert during home visit. Ambulating without difficulty. Reviewed patients CBG log:  AM readings range 62-201,  105 today;   Lunch range is 82-343,  Supper range 114-443, Bedtime range 119-400.   Filed Vitals:   03/28/15 1016  BP: 138/62  Pulse: 69  Resp: 18  Weight: 245 lb (111.131 kg)  SpO2: 95%   Review of Systems   Respiratory: Positive for hemoptysis. Negative for shortness of breath.   Gastrointestinal: Positive for constipation.  Musculoskeletal: Negative for falls.  Endo/Heme/Allergies: Bruises/bleeds easily.    Physical Exam  Constitutional: He is oriented to person, place, and time. He appears well-developed and well-nourished.  Cardiovascular: Normal rate.   Respiratory: Effort normal and breath sounds normal.  GI: Soft. Bowel sounds are normal.  Musculoskeletal: He exhibits edema.  1 plus edema to both legs  Neurological: He is alert and oriented to person, place, and time.  Skin: Skin is warm and dry.  Bandage noted to the right forearm x 2. Dressing applied by wound clinic on 03/27/2015,  Bandage to the right and left shin applied at the wound clinic yesterday.  Skin discolored to the lower legs bilaterally.  Skin appears to be less dry during this home  Visit.  Psychiatric: He has a normal mood and affect. His behavior is normal. Judgment and thought content normal.    Current Medications:   Current Outpatient Prescriptions  Medication Sig Dispense Refill  . albuterol (PROVENTIL HFA;VENTOLIN HFA) 108 (90 BASE) MCG/ACT inhaler Inhale 2 puffs into the lungs every 6 (six) hours as needed for wheezing or shortness of breath.    Marland Kitchen albuterol (PROVENTIL) (2.5 MG/3ML) 0.083% nebulizer solution Take 2.5 mg by nebulization 2 (two) times daily. And as needed    . atorvastatin (LIPITOR) 40 MG tablet Take 40 mg by mouth daily.    Marland Kitchen  azelastine (ASTELIN) 137 MCG/SPRAY nasal spray Place 1 spray into both nostrils at bedtime as needed for allergies.     Marland Kitchen dexlansoprazole (DEXILANT) 60 MG capsule Take 60 mg by mouth daily.    Marland Kitchen diltiazem (CARDIZEM) 30 MG tablet Take 30 mg by mouth 2 (two) times daily.    . famotidine (PEPCID) 20 MG tablet TAKE 1 TABLET BY MOUTH AT BEDTIME 30 tablet 6  . fluticasone (FLONASE) 50 MCG/ACT nasal spray Place 1 spray into both nostrils daily as needed for allergies.     .  Fluticasone-Salmeterol (ADVAIR) 500-50 MCG/DOSE AEPB Inhale 1 puff into the lungs every 12 (twelve) hours. 60 each 11  . hydrALAZINE (APRESOLINE) 50 MG tablet Take 50 mg by mouth. Take 1 tablet in am and 2 tablets at night by mouth    . Insulin Human (INSULIN PUMP) SOLN Inject 1 each into the skin continuous. Inject 0.8 units of novolog every hour per insulin pump.  Dose Increased due to chronic steroid use 1 each 11  . ipratropium (ATROVENT) 0.02 % nebulizer solution Take 0.5 mg by nebulization 4 (four) times daily as needed for wheezing or shortness of breath.    . isosorbide mononitrate (IMDUR) 60 MG 24 hr tablet Take 60 mg by mouth every morning.    . latanoprost (XALATAN) 0.005 % ophthalmic solution Place 1 drop into both eyes at bedtime.    Marland Kitchen losartan (COZAAR) 100 MG tablet Take 1 tablet (100 mg total) by mouth daily. 30 tablet 11  . metolazone (ZAROXOLYN) 2.5 MG tablet Take 2.5 mg by mouth daily as needed. For dizzy    . MOVANTIK 25 MG TABS 25 mg. Take 1 tablet by mouth daily    . Multiple Vitamins-Minerals (CENTRUM SILVER PO) Take 1 tablet by mouth daily.    . nitroGLYCERIN (NITROSTAT) 0.4 MG SL tablet Place 0.4 mg under the tongue every 5 (five) minutes as needed. For chest pain    . OXYCONTIN 10 MG T12A 12 hr tablet Take 10 mg by mouth 2 (two) times daily.    . pantoprazole (PROTONIX) 40 MG tablet Take 40 mg by mouth 2 (two) times daily.    Vladimir Faster Glycol-Propyl Glycol (SYSTANE PRESERVATIVE FREE) 0.4-0.3 % SOLN Apply 1 drop to eye 4 (four) times daily as needed (dryness of eye).     . potassium chloride SA (K-DUR,KLOR-CON) 20 MEQ tablet Take 20 mEq by mouth 3 (three) times daily.    . pramipexole (MIRAPEX) 1.5 MG tablet Take 1.5 mg by mouth at bedtime.    . predniSONE (DELTASONE) 10 MG tablet Take 3 tablets (30 mg total) by mouth daily with breakfast. 60 tablet 6  . pregabalin (LYRICA) 75 MG capsule Take 1 capsule (75 mg total) by mouth 2 (two) times daily. 60 capsule 0  .  Rivaroxaban (XARELTO) 15 MG TABS tablet Take 15 mg by mouth daily.    Marland Kitchen torsemide (DEMADEX) 20 MG tablet Take 20 mg by mouth 2 (two) times daily.    . benzonatate (TESSALON) 100 MG capsule Take 100 mg by mouth as needed.    . magnesium oxide (MAG-OX) 400 MG tablet Take 1,600 mg by mouth daily.      No current facility-administered medications for this visit.    Functional Status:   In your present state of health, do you have any difficulty performing the following activities: 03/13/2015 02/28/2015  Hearing? N N  Vision? Y Y  Difficulty concentrating or making decisions? N N  Walking or climbing stairs?  N N  Dressing or bathing? N N  Doing errands, shopping? N N  Preparing Food and eating ? N N  Using the Toilet? N N  In the past six months, have you accidently leaked urine? N N  Do you have problems with loss of bowel control? N N  Managing your Medications? N N  Managing your Finances? N N  Housekeeping or managing your Housekeeping? N N    Fall/Depression Screening:    PHQ 2/9 Scores 03/13/2015 02/28/2015  PHQ - 2 Score 0 0   Fall Risk  03/28/2015 03/13/2015 02/28/2015  Falls in the past year? No No No   Assessment:   (1) CBG range noted above.  Patient thinks readings are coming down. Patient continues to have the lack of understanding about his pump. (2) Patient reports wounds are healing. (3)  Patient has not followed up with social worker about advanced directives. Plan:  (1) I have placed call to [employer] representative Garret Reddish. I left her a message to call patient. I provided patient with representatives phone number for patient to follow up as well. Patient to continue to monitor CBGs and watch his diet. (2) Patient reports that he will continue to go to wound clinic weekly.  (3) During home visit provided patient with social workers telephone number. Patient called social worker during this home visit and left a message. Patient wants help with completing advanced  directives.   [facility] CM Care Plan Problem One        Patient Outreach from 03/28/2015 in [place] Problem One  Knowledge deficit related to self management of diabetes.   Care Plan for Problem One  Active   THN Long Term Goal (31-90 days)  Patient will be able to report feeling more in control of his CBG range in the next 60 days. [update: Patient continues to have concerns about insulin pum]   THN Long Term Goal Start Date  02/28/15   Interventions for Problem One Long Term Goal    Reviewed low blood sugar self care, controlling blood sugar, diabetes diet.  Reviewed patients home CBG log,    Placed call to [employer] representative about insulin pump education needed..   THN CM Short Term Goal #1 (0-30 days)  Patient will record CBG readings daily for the next 30 days.   THN CM Short Term Goal #1 Start Date  02/28/15   [facility] CM Short Term Goal #1 Met Date  03/28/15 [goal met. Patient has been keeping good records.]   Interventions for Short Term Goal #1  Discussed importance of record keeping. discussed signs and symptoms of hypoglycemia, Encouraged patient to discuss his concerns with MD during office visit.   THN CM Short Term Goal #2 (0-30 days)  Patient will be able to reports reviewing diabetic diet samples provided in the next 30 days.   THN CM Short Term Goal #2 Start Date  02/28/15   [facility] CM Short Term Goal #2 Met Date  03/28/15 Barrie Folk met, patient reports that he reviewed DM material]   Interventions for Short Term Goal #2  Patient is undecided about his ablilty to change his diet,  Reviewed simple foods to avoid like bread, pasta, potatoes, rice and sweets.    [facility] CM Care Plan Problem Two        Patient Outreach from 03/28/2015 in [place] for Problem Two  Active   Interventions for Problem Two Long Term  Goal   reported legs wounds to MD office Hoyle Sauer), encouraged patient to discuss options like the wound center with MD during next  office visit which is today. Reviewed signs of infection.   THN Long Term Goal (31-90) days  Patient will be able to report increase healing in legs in the next 60 days.   THN Long Term Goal Start Date  02/28/15   THN CM Short Term Goal #1 (0-30 days)  Patient will report improvement of healing to legs wounds in the next 30 days.   THN CM Short Term Goal #1 Start Date  02/28/15   Interventions for Short Term Goal #2   Discussed with patient the importance of following the recommendations from MD today. Discussed importance of low salt and leg elevation to decrease swelling   THN CM Short Term Goal #2 (0-30 days)  Patient will be able to verbalize reasons to call MD for skin concerns in the next 30 days.   THN CM Short Term Goal #2 Start Date  02/28/15   Interventions for Short Term Goal #2  Discussed importance of checking feet daily and observing wounds on legs and reporting any changes to MD.   [facility] CM Short Term Goal #3 (0-30 days)  Patient will be able to report that he got a follow up call from [employer] representative in the next 10 days.   THN CM Short Term Goal #3 Start Date  03/28/15   Interventions for Short Term Goal #3  Reviewed importance of patient following up with [employer] representative. Provided patient with contact information for his representative Janit Pagan.    [facility] CM Care Plan Problem Three        Patient Outreach Telephone from 03/13/2015 in [place] Problem Three  Advanced Directives not in place.   Care Plan for Problem Three  Active   THN CM Short Term Goal #1 (0-30 days)  CSW will assist patient with completion of Advanced Directivies (Living Will and [place] documents), within the next two weeks    THN CM Short Term Goal #1 Start Date  03/13/15   Interventions for Short Term Goal #1  Home visit scheduled with patient to assist with completion of Advanced Directives.     Collaboratative care planning and goals setting  during home visit, patients priority goal is the understand how to use his insulin pump better.  Follow up planned for August 30th   Tomasa Rand, [place], [employer], [facility] [employer] [phone]

## 2015-03-29 ENCOUNTER — Other Ambulatory Visit: Payer: Self-pay

## 2015-03-29 ENCOUNTER — Other Ambulatory Visit: Payer: Self-pay | Admitting: *Deleted

## 2015-03-29 NOTE — Patient Outreach (Signed)
Care Coordination: Spoke with Deitra Mayo regarding patients needs with Advance Directives.  Deitra Mayo will follow up with patient Confirmed that patient left a message.  Tomasa Rand, RN, BSN, CEN [facility] [employer] [phone]

## 2015-03-29 NOTE — Patient Outreach (Signed)
[place] [facility]) Care Management  03/29/2015  Jon [name] [birth year] [id number]   Per RNCM's request, CSW made another attempt to try and contact patient today, without success.  CSW left a HIPAA compliant message for patient with his wife, Jon [name], encouraging her to have patient return CSW's call at his earliest convenience.  CSW will await a return call from patient.  [name], BSW, MSW, [place] [facility] [place], [place], [place] [address] [name] [phone]

## 2015-04-02 ENCOUNTER — Other Ambulatory Visit: Payer: Self-pay | Admitting: *Deleted

## 2015-04-02 NOTE — Patient Outreach (Signed)
[facility]) Care Management  04/02/2015  Jon [name] [birth year] [id number]   CSW was able to make contact with patient today to reschedule the initial home visit.  CSW agreed to meet with patient to assist with completion of Advanced Directives ([place] documents).  Initial home visit is scheduled for Friday, August 12th at [place], [place], MSW, [place] [facility] [place], [place], [place] [address] [name] [phone]

## 2015-04-03 ENCOUNTER — Ambulatory Visit: Payer: 59 | Admitting: Critical Care Medicine

## 2015-04-03 DIAGNOSIS — Z8673 Personal history of transient ischemic attack (TIA), and cerebral infarction without residual deficits: Secondary | ICD-10-CM | POA: Diagnosis not present

## 2015-04-03 DIAGNOSIS — L97919 Non-pressure chronic ulcer of unspecified part of right lower leg with unspecified severity: Secondary | ICD-10-CM | POA: Diagnosis present

## 2015-04-03 DIAGNOSIS — J811 Chronic pulmonary edema: Secondary | ICD-10-CM | POA: Diagnosis not present

## 2015-04-03 DIAGNOSIS — M1990 Unspecified osteoarthritis, unspecified site: Secondary | ICD-10-CM | POA: Diagnosis present

## 2015-04-03 DIAGNOSIS — E6609 Other obesity due to excess calories: Secondary | ICD-10-CM | POA: Diagnosis present

## 2015-04-03 DIAGNOSIS — E1159 Type 2 diabetes mellitus with other circulatory complications: Secondary | ICD-10-CM | POA: Diagnosis not present

## 2015-04-03 DIAGNOSIS — Z79899 Other long term (current) drug therapy: Secondary | ICD-10-CM | POA: Diagnosis not present

## 2015-04-03 DIAGNOSIS — I4891 Unspecified atrial fibrillation: Secondary | ICD-10-CM | POA: Diagnosis present

## 2015-04-03 DIAGNOSIS — I517 Cardiomegaly: Secondary | ICD-10-CM | POA: Diagnosis not present

## 2015-04-03 DIAGNOSIS — J45909 Unspecified asthma, uncomplicated: Secondary | ICD-10-CM | POA: Diagnosis present

## 2015-04-03 DIAGNOSIS — J9600 Acute respiratory failure, unspecified whether with hypoxia or hypercapnia: Secondary | ICD-10-CM | POA: Diagnosis not present

## 2015-04-03 DIAGNOSIS — A419 Sepsis, unspecified organism: Secondary | ICD-10-CM | POA: Diagnosis not present

## 2015-04-03 DIAGNOSIS — R042 Hemoptysis: Secondary | ICD-10-CM | POA: Diagnosis present

## 2015-04-03 DIAGNOSIS — Z794 Long term (current) use of insulin: Secondary | ICD-10-CM | POA: Diagnosis not present

## 2015-04-03 DIAGNOSIS — Z885 Allergy status to narcotic agent status: Secondary | ICD-10-CM | POA: Diagnosis not present

## 2015-04-03 DIAGNOSIS — J181 Lobar pneumonia, unspecified organism: Secondary | ICD-10-CM | POA: Diagnosis not present

## 2015-04-03 DIAGNOSIS — L97929 Non-pressure chronic ulcer of unspecified part of left lower leg with unspecified severity: Secondary | ICD-10-CM | POA: Diagnosis present

## 2015-04-03 DIAGNOSIS — E876 Hypokalemia: Secondary | ICD-10-CM | POA: Diagnosis present

## 2015-04-03 DIAGNOSIS — J449 Chronic obstructive pulmonary disease, unspecified: Secondary | ICD-10-CM | POA: Diagnosis present

## 2015-04-03 DIAGNOSIS — R0602 Shortness of breath: Secondary | ICD-10-CM | POA: Diagnosis not present

## 2015-04-03 DIAGNOSIS — J9620 Acute and chronic respiratory failure, unspecified whether with hypoxia or hypercapnia: Secondary | ICD-10-CM | POA: Diagnosis not present

## 2015-04-03 DIAGNOSIS — Z880 Allergy status to penicillin: Secondary | ICD-10-CM | POA: Diagnosis not present

## 2015-04-03 DIAGNOSIS — E039 Hypothyroidism, unspecified: Secondary | ICD-10-CM | POA: Diagnosis present

## 2015-04-03 DIAGNOSIS — J9601 Acute respiratory failure with hypoxia: Secondary | ICD-10-CM | POA: Diagnosis not present

## 2015-04-03 DIAGNOSIS — G4733 Obstructive sleep apnea (adult) (pediatric): Secondary | ICD-10-CM | POA: Diagnosis present

## 2015-04-03 DIAGNOSIS — I10 Essential (primary) hypertension: Secondary | ICD-10-CM | POA: Diagnosis present

## 2015-04-03 DIAGNOSIS — Z87891 Personal history of nicotine dependence: Secondary | ICD-10-CM | POA: Diagnosis not present

## 2015-04-03 DIAGNOSIS — J189 Pneumonia, unspecified organism: Secondary | ICD-10-CM | POA: Diagnosis not present

## 2015-04-03 DIAGNOSIS — I87313 Chronic venous hypertension (idiopathic) with ulcer of bilateral lower extremity: Secondary | ICD-10-CM | POA: Diagnosis not present

## 2015-04-03 DIAGNOSIS — J8489 Other specified interstitial pulmonary diseases: Secondary | ICD-10-CM | POA: Diagnosis not present

## 2015-04-03 DIAGNOSIS — Z23 Encounter for immunization: Secondary | ICD-10-CM | POA: Diagnosis not present

## 2015-04-03 DIAGNOSIS — E119 Type 2 diabetes mellitus without complications: Secondary | ICD-10-CM | POA: Diagnosis not present

## 2015-04-03 DIAGNOSIS — I509 Heart failure, unspecified: Secondary | ICD-10-CM | POA: Diagnosis present

## 2015-04-04 ENCOUNTER — Other Ambulatory Visit: Payer: Self-pay

## 2015-04-04 NOTE — Patient Outreach (Signed)
Care Coordination: Call back from Tora Duck [facility] representative)  She reports that patient was a no show for planned appointment yesterday at the [facility]. Ms. Heath Gold reports that patient never called to cancel and she called patient about 4 times without an answer. Reports that she left patient a message and he never called her back.    Ms. Heath Gold reports that she will try again.  I placed call to patient to ask him to call Ms. Petko. No answer on home phone and  I left a message on mobile number asking patient to call me back.  PLAN: Will await a call back from patient and or [employer] representative.  Tomasa Rand, RN, BSN, CEN [facility] [employer] [phone]

## 2015-04-04 NOTE — Patient Outreach (Signed)
Care coordination: left a message for [employer] representative to call me back. Patient has reported that he has not heard from representative from message left last week.  Plan: Will await a call back.  Tomasa Rand, RN, BSN, CEN [facility] [employer] [phone]

## 2015-04-05 ENCOUNTER — Telehealth: Payer: Self-pay | Admitting: Critical Care Medicine

## 2015-04-05 NOTE — Telephone Encounter (Signed)
Called spoke with [facility] (ICU nurse) at [place]. She reports pt currently hospitalized and will be d/c'd either Friday/saturday. She reports the doctor there spoke with PW and wants pt to see TP either Monday or Tuesday. TP please advise thanks

## 2015-04-05 NOTE — Telephone Encounter (Signed)
Per TP  It is okay to use held spot on 04/10/15.  Called and spoke with Morey Hummingbird from ICU  Pt is scheduled to come in on 04/10/15 @ 11:30 for hospital follow up.  She stated she would make pt aware.  Nothing further needed.

## 2015-04-10 ENCOUNTER — Encounter: Payer: Self-pay | Admitting: Adult Health

## 2015-04-10 ENCOUNTER — Ambulatory Visit (INDEPENDENT_AMBULATORY_CARE_PROVIDER_SITE_OTHER)
Admission: RE | Admit: 2015-04-10 | Discharge: 2015-04-10 | Disposition: A | Discharge status: Home / Self Care | Payer: Medicare Other | Source: Ambulatory Visit | Attending: Adult Health | Admitting: Adult Health

## 2015-04-10 ENCOUNTER — Ambulatory Visit (INDEPENDENT_AMBULATORY_CARE_PROVIDER_SITE_OTHER): Payer: Medicare Other | Admitting: Adult Health

## 2015-04-10 VITALS — BP 118/82 | HR 94 | Temp 98.4°F | Ht 67.0 in | Wt 253.0 lb

## 2015-04-10 DIAGNOSIS — J189 Pneumonia, unspecified organism: Secondary | ICD-10-CM

## 2015-04-10 DIAGNOSIS — G4733 Obstructive sleep apnea (adult) (pediatric): Secondary | ICD-10-CM

## 2015-04-10 DIAGNOSIS — I517 Cardiomegaly: Secondary | ICD-10-CM | POA: Diagnosis not present

## 2015-04-10 DIAGNOSIS — J9611 Chronic respiratory failure with hypoxia: Secondary | ICD-10-CM

## 2015-04-10 DIAGNOSIS — J8489 Other specified interstitial pulmonary diseases: Secondary | ICD-10-CM

## 2015-04-10 DIAGNOSIS — J9610 Chronic respiratory failure, unspecified whether with hypoxia or hypercapnia: Secondary | ICD-10-CM | POA: Insufficient documentation

## 2015-04-10 HISTORY — DX: Chronic respiratory failure, unspecified whether with hypoxia or hypercapnia: J96.10

## 2015-04-10 HISTORY — DX: Pneumonia, unspecified organism: J18.9

## 2015-04-10 NOTE — Assessment & Plan Note (Signed)
Recent admission for PNA cxr is showing improvement  Clinically improving   Plan  Finish Levaquin as directed.  Wear oxygen 3 l/m with walking and At bedtime   Follow up Dr. [name]  In 4 weeks and As needed  With chest xray

## 2015-04-10 NOTE — Assessment & Plan Note (Addendum)
Compensated on oxygen   Plan  Wear oxygen 3 l/m with walking and At bedtime   Follow up Dr. [name]  In 4 weeks

## 2015-04-10 NOTE — Assessment & Plan Note (Addendum)
Recent flare , responding to steroids   Plan   Taper Prednisone to 30mg  daily until seen back in office    Follow up Dr. [name]  In 4 weeks and As needed  With chest xray

## 2015-04-10 NOTE — Assessment & Plan Note (Signed)
Continue on CPAP At bedtime   Wear oxygen 3 l/m with walking and At bedtime   Follow up Dr. [name]  In 4 weeks and As needed

## 2015-04-10 NOTE — Patient Instructions (Addendum)
Finish Levaquin as directed.  Taper Prednisone to 30mg  daily until seen back in office  Continue on CPAP At bedtime   Wear oxygen 3 l/m with walking and At bedtime   Follow up Dr. [name]  In 4 weeks and As needed  With chest xray

## 2015-04-10 NOTE — Progress Notes (Signed)
   Subjective:    Patient ID: Jon [name], male    DOB: 1950/11/27, 64 y.o.   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  Discharged on Levaquin 750mg  and Prednisone taper.  He is feeling some better but still weak.  Has lingering cough, congestion and dyspnea.  CXR today shows partial clearing of right lung infiltrate.  Currently tapering high dose prednisone on slow taper to 30mg  daily .  Denies chest pain, orthopnea, increased edema, fever or hemoptysis .  Remains on Insulin for DM . Adjusted for elevated sugars.   Remains on CPAP At bedtime        Review of Systems  Constitutional:   No  weight loss, night sweats,  Fevers, chills,  +fatigue, or  lassitude.  HEENT:   No headaches,  Difficulty swallowing,  Tooth/dental problems, or  Sore throat,                No sneezing, itching, ear ache,  +nasal congestion, post nasal drip,   CV:  No chest pain,  Orthopnea, PND, swelling in lower extremities, anasarca, dizziness, palpitations, syncope.   GI  No heartburn, indigestion, abdominal pain, nausea, vomiting, diarrhea, change in bowel habits, loss of appetite, bloody stools.   Resp:    No chest wall deformity  Skin: no rash or lesions.  GU: no dysuria, change in color of urine, no urgency or frequency.  No flank pain, no hematuria   MS:  No joint pain or swelling.  No decreased range of motion.  No back pain.  Psych:  No change in mood or affect. No depression or anxiety.  No memory loss.          Objective:   Physical Exam GEN: A/Ox3; pleasant , NAD, obese , chronically ill appearing   HEENT:  [place]/AT,  EACs-clear, TMs-wnl, NOSE-clear, THROAT-clear, no lesions, no postnasal drip or exudate noted.   NECK:  Supple w/ fair ROM;  no JVD; normal carotid impulses w/o bruits; no thyromegaly or nodules palpated; no lymphadenopathy.  RESP  Few trace rhonchi, decreased BS in bases  no accessory muscle use, no dullness to percussion  CARD:  RRR, no m/r/g  , Tr-1+   peripheral edema, pulses intact, no cyanosis or clubbing.  GI:   Soft & nt; nml bowel sounds; no organomegaly or masses detected.  Musco: Warm bil, no deformities or joint swelling noted.   Neuro: alert, no focal deficits noted.    Skin: Warm, no lesions or rashes   04/10/15 CXR  Partial clearing of right lung infiltrate. 2. Mild new atelectasis versus infiltrate in the lingula cannot be excluded.      Assessment & Plan:

## 2015-04-13 ENCOUNTER — Encounter: Payer: Self-pay | Admitting: *Deleted

## 2015-04-13 ENCOUNTER — Other Ambulatory Visit: Payer: Self-pay | Admitting: *Deleted

## 2015-04-13 NOTE — Patient Outreach (Signed)
[facility]) Care Management  [facility] Social Work  04/13/2015  Jon [name] Jan 26, 1951 [id number]   Current Medications:  Current Outpatient Prescriptions  Medication Sig Dispense Refill  . albuterol (PROVENTIL HFA;VENTOLIN HFA) 108 (90 BASE) MCG/ACT inhaler Inhale 2 puffs into the lungs every 6 (six) hours as needed for wheezing or shortness of breath.    Marland Kitchen albuterol (PROVENTIL) (2.5 MG/3ML) 0.083% nebulizer solution Take 2.5 mg by nebulization 2 (two) times daily. And as needed    . atorvastatin (LIPITOR) 40 MG tablet Take 40 mg by mouth daily.    Marland Kitchen azelastine (ASTELIN) 137 MCG/SPRAY nasal spray Place 1 spray into both nostrils at bedtime as needed for allergies.     . benzonatate (TESSALON) 100 MG capsule Take 100 mg by mouth as needed.    Marland Kitchen dexlansoprazole (DEXILANT) 60 MG capsule Take 60 mg by mouth daily.    Marland Kitchen diltiazem (CARDIZEM) 30 MG tablet Take 30 mg by mouth 2 (two) times daily.    . famotidine (PEPCID) 20 MG tablet TAKE 1 TABLET BY MOUTH AT BEDTIME 30 tablet 6  . fluticasone (FLONASE) 50 MCG/ACT nasal spray Place 1 spray into both nostrils daily as needed for allergies.     . Fluticasone-Salmeterol (ADVAIR) 500-50 MCG/DOSE AEPB Inhale 1 puff into the lungs every 12 (twelve) hours. 60 each 11  . hydrALAZINE (APRESOLINE) 50 MG tablet Take 50 mg by mouth. Take 1 tablet in am and 2 tablets at night by mouth    . Insulin Human (INSULIN PUMP) SOLN Inject 1 each into the skin continuous. Inject 0.8 units of novolog every hour per insulin pump.  Dose Increased due to chronic steroid use 1 each 11  . ipratropium (ATROVENT) 0.02 % nebulizer solution Take 0.5 mg by nebulization 4 (four) times daily as needed for wheezing or shortness of breath.    . isosorbide mononitrate (IMDUR) 60 MG 24 hr tablet Take 60 mg by mouth every morning.    . latanoprost (XALATAN) 0.005 % ophthalmic solution Place 1 drop into both eyes at bedtime.    Marland Kitchen levofloxacin (LEVAQUIN) 750 MG tablet  Take 1 tablet by mouth. For 7 days    . losartan (COZAAR) 100 MG tablet Take 1 tablet (100 mg total) by mouth daily. 30 tablet 11  . magnesium oxide (MAG-OX) 400 MG tablet Take 1,600 mg by mouth daily.     . metolazone (ZAROXOLYN) 2.5 MG tablet Take 2.5 mg by mouth daily as needed. For dizzy    . MOVANTIK 25 MG TABS 25 mg. Take 1 tablet by mouth daily    . Multiple Vitamins-Minerals (CENTRUM SILVER PO) Take 1 tablet by mouth daily.    . nitroGLYCERIN (NITROSTAT) 0.4 MG SL tablet Place 0.4 mg under the tongue every 5 (five) minutes as needed. For chest pain    . NOVOLOG 100 UNIT/ML injection Adjusted daily    . OXYCONTIN 10 MG T12A 12 hr tablet Take 10 mg by mouth 2 (two) times daily.    . pantoprazole (PROTONIX) 40 MG tablet Take 40 mg by mouth 2 (two) times daily.    Vladimir Faster Glycol-Propyl Glycol (SYSTANE PRESERVATIVE FREE) 0.4-0.3 % SOLN Apply 1 drop to eye 4 (four) times daily as needed (dryness of eye).     . potassium chloride SA (K-DUR,KLOR-CON) 20 MEQ tablet Take 20 mEq by mouth 3 (three) times daily.    . pramipexole (MIRAPEX) 1.5 MG tablet Take 1.5 mg by mouth at bedtime.    . predniSONE (  DELTASONE) 10 MG tablet Take 3 tablets (30 mg total) by mouth daily with breakfast. (Patient taking differently: Take 30 mg by mouth 2 (two) times daily with a meal. ) 60 tablet 6  . pregabalin (LYRICA) 75 MG capsule Take 1 capsule (75 mg total) by mouth 2 (two) times daily. (Patient not taking: Reported on 04/10/2015) 60 capsule 0  . Rivaroxaban (XARELTO) 15 MG TABS tablet Take 15 mg by mouth daily.    Marland Kitchen torsemide (DEMADEX) 20 MG tablet Take 20 mg by mouth 2 (two) times daily.     No current facility-administered medications for this visit.    Functional Status:  In your present state of health, do you have any difficulty performing the following activities: 03/13/2015 02/28/2015  Hearing? N N  Vision? Y Y  Difficulty concentrating or making decisions? N N  Walking or climbing stairs? N N   Dressing or bathing? N N  Doing errands, shopping? N N  Preparing Food and eating ? N N  Using the Toilet? N N  In the past six months, have you accidently leaked urine? N N  Do you have problems with loss of bowel control? N N  Managing your Medications? N N  Managing your Finances? N N  Housekeeping or managing your Housekeeping? N N    Fall/Depression Screening:  PHQ 2/9 Scores 03/13/2015 02/28/2015  PHQ - 2 Score 0 0    Assessment:   CSW was able to meet with patient and patient's wife, Jon [name] today to perform the initial home visit.  Patient and Mrs. Yurkovich were sitting in the living room watching television at the time of CSW's arrival.  Patient did not appear to be interested in engaging in conversation with CSW, so Mrs. Riedl answered a great deal of the questions asked.  CSW was able to complete patient's initial assessment, as well as assist patient with completion of his Advanced Directives ([place] documents).  CSW read through the documents with patient, requesting that he initial areas that applied to his wishes with regards to end-of-life care.  CSW provided patient with a list of places where he can take the documents to have them notarized.  Patient voiced understanding.  Mrs. Goynes indicated that she is not interested in completing her Advanced Directives at this time; however, CSW provided her with a packet of information for future use.  CSW then inquired as to how CSW could be of further assistance to patient and Mrs. Gronewold at this time.  Both denied being able to identify any additional social work specific needs.  CSW explained to patient and Mrs. Serviss that [facility] would be closing patient's case but that patient's RNCM with [facility], Tomasa Rand would remain actively involved.  Patient was agreeable to this plan.  Plan:   CSW will perform a case closure on patient, as all goals of treatment from  social work standpoint have been met and no additional social work needs have been identified at this time. CSW will fax a correspondence letter to patient's Primary Care Physician, Dr. Rochel Brome to ensure that Dr. Tobie Poet is aware of CSW's involvement with patient. CSW will submit a case closure request to Lurline Del, Care Management Assistant with [facility], in the form of an [employer].  [name], BSW, MSW, [facility] [place], [place] [address], [place] [address] [name] [phone]

## 2015-04-17 DIAGNOSIS — J45909 Unspecified asthma, uncomplicated: Secondary | ICD-10-CM | POA: Diagnosis not present

## 2015-04-17 DIAGNOSIS — J449 Chronic obstructive pulmonary disease, unspecified: Secondary | ICD-10-CM | POA: Diagnosis not present

## 2015-04-17 DIAGNOSIS — L97811 Non-pressure chronic ulcer of other part of right lower leg limited to breakdown of skin: Secondary | ICD-10-CM | POA: Diagnosis not present

## 2015-04-17 DIAGNOSIS — E11622 Type 2 diabetes mellitus with other skin ulcer: Secondary | ICD-10-CM | POA: Diagnosis not present

## 2015-04-17 DIAGNOSIS — L97212 Non-pressure chronic ulcer of right calf with fat layer exposed: Secondary | ICD-10-CM | POA: Diagnosis not present

## 2015-04-17 DIAGNOSIS — E1121 Type 2 diabetes mellitus with diabetic nephropathy: Secondary | ICD-10-CM | POA: Diagnosis not present

## 2015-04-17 DIAGNOSIS — I872 Venous insufficiency (chronic) (peripheral): Secondary | ICD-10-CM | POA: Diagnosis not present

## 2015-04-17 DIAGNOSIS — J9601 Acute respiratory failure with hypoxia: Secondary | ICD-10-CM | POA: Diagnosis not present

## 2015-04-17 DIAGNOSIS — L308 Other specified dermatitis: Secondary | ICD-10-CM | POA: Diagnosis not present

## 2015-04-17 DIAGNOSIS — G4733 Obstructive sleep apnea (adult) (pediatric): Secondary | ICD-10-CM | POA: Diagnosis not present

## 2015-04-23 DIAGNOSIS — J449 Chronic obstructive pulmonary disease, unspecified: Secondary | ICD-10-CM | POA: Diagnosis not present

## 2015-04-23 DIAGNOSIS — G4733 Obstructive sleep apnea (adult) (pediatric): Secondary | ICD-10-CM | POA: Diagnosis not present

## 2015-04-23 DIAGNOSIS — I10 Essential (primary) hypertension: Secondary | ICD-10-CM | POA: Diagnosis not present

## 2015-04-23 DIAGNOSIS — E1121 Type 2 diabetes mellitus with diabetic nephropathy: Secondary | ICD-10-CM | POA: Diagnosis not present

## 2015-04-23 DIAGNOSIS — E782 Mixed hyperlipidemia: Secondary | ICD-10-CM | POA: Diagnosis not present

## 2015-04-23 DIAGNOSIS — I482 Chronic atrial fibrillation: Secondary | ICD-10-CM | POA: Diagnosis not present

## 2015-04-23 DIAGNOSIS — M170 Bilateral primary osteoarthritis of knee: Secondary | ICD-10-CM | POA: Diagnosis not present

## 2015-04-23 DIAGNOSIS — K219 Gastro-esophageal reflux disease without esophagitis: Secondary | ICD-10-CM | POA: Diagnosis not present

## 2015-04-24 ENCOUNTER — Encounter: Payer: Self-pay | Admitting: Critical Care Medicine

## 2015-04-24 DIAGNOSIS — E11622 Type 2 diabetes mellitus with other skin ulcer: Secondary | ICD-10-CM | POA: Diagnosis not present

## 2015-04-24 DIAGNOSIS — Z09 Encounter for follow-up examination after completed treatment for conditions other than malignant neoplasm: Secondary | ICD-10-CM | POA: Diagnosis not present

## 2015-04-24 DIAGNOSIS — I872 Venous insufficiency (chronic) (peripheral): Secondary | ICD-10-CM | POA: Diagnosis not present

## 2015-04-24 DIAGNOSIS — Z872 Personal history of diseases of the skin and subcutaneous tissue: Secondary | ICD-10-CM | POA: Diagnosis not present

## 2015-04-24 DIAGNOSIS — E119 Type 2 diabetes mellitus without complications: Secondary | ICD-10-CM | POA: Diagnosis not present

## 2015-04-27 DIAGNOSIS — I83892 Varicose veins of left lower extremities with other complications: Secondary | ICD-10-CM | POA: Diagnosis not present

## 2015-04-27 DIAGNOSIS — I83029 Varicose veins of left lower extremity with ulcer of unspecified site: Secondary | ICD-10-CM | POA: Diagnosis not present

## 2015-04-27 DIAGNOSIS — I83019 Varicose veins of right lower extremity with ulcer of unspecified site: Secondary | ICD-10-CM | POA: Diagnosis not present

## 2015-04-27 DIAGNOSIS — I83813 Varicose veins of bilateral lower extremities with pain: Secondary | ICD-10-CM | POA: Diagnosis not present

## 2015-04-27 DIAGNOSIS — I83893 Varicose veins of bilateral lower extremities with other complications: Secondary | ICD-10-CM | POA: Diagnosis not present

## 2015-05-01 ENCOUNTER — Other Ambulatory Visit: Payer: Self-pay

## 2015-05-01 NOTE — Patient Outreach (Signed)
Jon [name]) Care Management   05/01/2015  Jon [name] 06/14/1951 [id number]  Jon [name] is an 64 y.o. male Arrived for home visit at 1:10pm. Patient home alone with his dog. Sitting in lift chair with compression stocking on legs.  Subjective: Patient reports that he has had a rough month. Reports that the end of July he started having increase bleeding in his lungs and shortness of breath. Patient reports that he was taken to the ED at [facility] and was admitted for bilateral pneumonia. Patient reports that he was sent home with a hospital bed and home health RN. Patient reports that home health nurse and he did not get along and he told the nurse not to come back. Patient reports that he had a follow up with pulmonary and with PCP. Patient reports to me today that he continues to have brown sputum and shortness of breath. States that he has not bounced back like he normally does. States that he has to wear his oxygen at 3 liters anytime he leaves the house and at night. Reports while in the house he can manage without his oxygen. Patient then reports that he had "Vein Surgery" on 8/26. Reports that he is now wearing compression hose all day and removes these at night. Reports that he has not had swelling in his legs since wearing the compression hose. Patient reports wounds are healing to both legs.  TM:HDQQIWL reports that his blood sugars have been elevated since hospital discharged due to prednisone. Reports that he spoke with [employer] representative and has a better understanding of how to manage his insulin pump. Reports no more concerns with insulin pump management. CBG range in the last 10 days are as listed: AM range 66-349, lunch 85-554, supper 91-372 according to patients CBG log.  Patient has been giving himself sliding scale insulin for elevated blood sugars.  Reports taking his medications without problems. Reports advanced directives completed except  for notary.   Objective:  Sitting in lift chair with black compression hose on. Ambulated to the scale slowly but without difficulty. Filed Vitals:   05/01/15 1400  BP: 122/64  Pulse: 94  Resp: 18  Weight: 242 lb (109.77 kg)  SpO2: 98%   Review of Systems  Constitutional: Positive for malaise/fatigue.  HENT: Negative.   Eyes: Negative.   Respiratory: Positive for cough, sputum production and shortness of breath.        Report brown sputum. States sputum never became clear after pneumonia.  Cardiovascular: Negative for leg swelling.  Skin: Negative.   Neurological: Negative.   Endo/Heme/Allergies: Bruises/bleeds easily.  Psychiatric/Behavioral: Negative.     Physical Exam  Constitutional: He is oriented to person, place, and time. He appears well-developed and well-nourished.  Cardiovascular: Normal rate and intact distal pulses.   Respiratory: Effort normal.  Rhonchi noted right and left lower lungs. Respirations even and unlabored. Able to talk in complete sentences.  GI: Soft. Bowel sounds are normal.  Musculoskeletal: He exhibits no edema.  Unable to visualize skin to legs due to compression hose.   Neurological: He is alert and oriented to person, place, and time.  Skin: Skin is warm and dry.  Psychiatric: He has a normal mood and affect. His behavior is normal. Judgment and thought content normal.    Current Medications:   Current Outpatient Prescriptions  Medication Sig Dispense Refill  . albuterol (PROVENTIL HFA;VENTOLIN HFA) 108 (90 BASE) MCG/ACT inhaler Inhale 2 puffs into the lungs every 6 (six)  hours as needed for wheezing or shortness of breath.    Marland Kitchen albuterol (PROVENTIL) (2.5 MG/3ML) 0.083% nebulizer solution Take 2.5 mg by nebulization 2 (two) times daily. And as needed    . atorvastatin (LIPITOR) 40 MG tablet Take 40 mg by mouth daily.    Marland Kitchen azelastine (ASTELIN) 137 MCG/SPRAY nasal spray Place 1 spray into both nostrils at bedtime as needed for allergies.      Marland Kitchen dexlansoprazole (DEXILANT) 60 MG capsule Take 60 mg by mouth daily.    Marland Kitchen diltiazem (CARDIZEM) 30 MG tablet Take 30 mg by mouth 2 (two) times daily.    . famotidine (PEPCID) 20 MG tablet TAKE 1 TABLET BY MOUTH AT BEDTIME 30 tablet 6  . fluticasone (FLONASE) 50 MCG/ACT nasal spray Place 1 spray into both nostrils daily as needed for allergies.     . Fluticasone-Salmeterol (ADVAIR) 500-50 MCG/DOSE AEPB Inhale 1 puff into the lungs every 12 (twelve) hours. 60 each 11  . hydrALAZINE (APRESOLINE) 50 MG tablet Take 50 mg by mouth. Take 1 tablet in am and 2 tablets at night by mouth    . Insulin Human (INSULIN PUMP) SOLN Inject 1 each into the skin continuous. Inject 0.8 units of novolog every hour per insulin pump.  Dose Increased due to chronic steroid use 1 each 11  . ipratropium (ATROVENT) 0.02 % nebulizer solution Take 0.5 mg by nebulization 4 (four) times daily as needed for wheezing or shortness of breath.    . isosorbide mononitrate (IMDUR) 60 MG 24 hr tablet Take 60 mg by mouth every morning.    . latanoprost (XALATAN) 0.005 % ophthalmic solution Place 1 drop into both eyes at bedtime.    Marland Kitchen losartan (COZAAR) 100 MG tablet Take 1 tablet (100 mg total) by mouth daily. 30 tablet 11  . magnesium oxide (MAG-OX) 400 MG tablet Take 1,600 mg by mouth daily.     . metolazone (ZAROXOLYN) 2.5 MG tablet Take 2.5 mg by mouth daily as needed. For dizzy    . MOVANTIK 25 MG TABS 25 mg. Take 1 tablet by mouth daily    . Multiple Vitamins-Minerals (CENTRUM SILVER PO) Take 1 tablet by mouth daily.    . nitroGLYCERIN (NITROSTAT) 0.4 MG SL tablet Place 0.4 mg under the tongue every 5 (five) minutes as needed. For chest pain    . NOVOLOG 100 UNIT/ML injection Adjusted daily    . OXYCONTIN 10 MG T12A 12 hr tablet Take 10 mg by mouth 2 (two) times daily.    . pantoprazole (PROTONIX) 40 MG tablet Take 40 mg by mouth 2 (two) times daily.    Vladimir Faster Glycol-Propyl Glycol (SYSTANE PRESERVATIVE FREE) 0.4-0.3 % SOLN  Apply 1 drop to eye 4 (four) times daily as needed (dryness of eye).     . potassium chloride SA (K-DUR,KLOR-CON) 20 MEQ tablet Take 20 mEq by mouth 3 (three) times daily.    . pramipexole (MIRAPEX) 1.5 MG tablet Take 1.5 mg by mouth at bedtime.    . predniSONE (DELTASONE) 10 MG tablet Take 3 tablets (30 mg total) by mouth daily with breakfast. (Patient taking differently: Take 30 mg by mouth 2 (two) times daily with a meal. ) 60 tablet 6  . pregabalin (LYRICA) 75 MG capsule Take 1 capsule (75 mg total) by mouth 2 (two) times daily. 60 capsule 0  . Rivaroxaban (XARELTO) 15 MG TABS tablet Take 15 mg by mouth daily.    Marland Kitchen torsemide (DEMADEX) 20 MG tablet Take 20 mg by  mouth 2 (two) times daily.    . benzonatate (TESSALON) 100 MG capsule Take 100 mg by mouth as needed.    Marland Kitchen levofloxacin (LEVAQUIN) 750 MG tablet Take 1 tablet by mouth. For 7 days     No current facility-administered medications for this visit.    Functional Status:   In your present state of health, do you have any difficulty performing the following activities: 04/13/2015 03/13/2015  Hearing? N N  Vision? Y Y  Difficulty concentrating or making decisions? N N  Walking or climbing stairs? Y N  Dressing or bathing? N N  Doing errands, shopping? N N  Preparing Food and eating ? N N  Using the Toilet? N N  In the past six months, have you accidently leaked urine? N N  Do you have problems with loss of bowel control? N N  Managing your Medications? N N  Managing your Finances? N N  Housekeeping or managing your Housekeeping? Y N    Fall/Depression Screening:    PHQ 2/9 Scores 04/13/2015 03/13/2015 02/28/2015  PHQ - 2 Score 0 0 0  Exception Documentation Patient refusal - -    Assessment:   (1) recent admission related to pneumonia. Using more oxygen than previous home visits. Brown sputum and rhonchi noted during todays home visit. (2) recent vein surgery. (3) reports healing of leg wounds (4) elevated CBG range. Patient  using sliding scale insulin to cover to elevated readings. Patient reports better understanding of how to use his insulin pump.  todays CBG reading this am was 221.  Plan:  (1) Placed call to pulmonary to get MD appointment moved up. Patient will see Clemetine Marker on 9/1 at 3pm.  Patient is happy to get appointment moved up due to concern of shortness of breath. Patient will call MD office if his symptoms worsening before appointment. (2) Reports that his legs and feet are feeling better. Patient plans to continue to wear compression hose. (3) patient will continue to follow up with wound clinic as directed. (4) Patient feels comfortable to managing his insulin pump at this time. Denies any more goals to work on with his diabetes. Encouraged patient to call primary MD with any concerns about his CBG levels.   Next MD appointment with Dr. Tobie Poet on 9/6 Next appointment with pulmonary on 9/1 at 3pm. Next home visit with [facility] nurse on 9/28. Encouraged patient to call me if needed. Goal setting with patient and his primary goals is to improve his breathing.  See care plan.   THN CM Care Plan Problem One        Most Recent Value   Care Plan Problem One  Knowledge deficit related to self management of diabetes.   Role Documenting the Problem One  Care Management [place] for Problem One  Active   THN Long Term Goal (31-90 days)  Patient will be able to report feeling more in control of his CBG range in the next 60 days. [update: Patient continues to have concerns about insulin pum]   THN Long Term Goal Start Date  02/28/15   [facility] Long Term Goal Met Date  05/01/15 [facility] met. patient is in better control of insulin pump]   Interventions for Problem One Long Term Goal    Reviewed low blood sugar self care, controlling blood sugar, diabetes diet.  Reviewed patients home CBG log,    Placed call to [employer] representative about insulin pump education needed..   THN CM Short  Term Goal #1 (0-30  days)  Patient will record CBG readings daily for the next 30 days.   THN CM Short Term Goal #1 Start Date  02/28/15   [facility] CM Short Term Goal #1 Met Date  03/28/15 [goal met. Patient has been keeping good records.]   Interventions for Short Term Goal #1  Discussed importance of record keeping. discussed signs and symptoms of hypoglycemia, Encouraged patient to discuss his concerns with MD during office visit.   THN CM Short Term Goal #2 (0-30 days)  Patient will be able to reports reviewing diabetic diet samples provided in the next 30 days.   THN CM Short Term Goal #2 Start Date  02/28/15   [facility] CM Short Term Goal #2 Met Date  03/28/15 Barrie Folk met, patient reports that he reviewed DM material]   Interventions for Short Term Goal #2  Patient is undecided about his ablilty to change his diet,  Reviewed simple foods to avoid like bread, pasta, potatoes, rice and sweets.    THN CM Care Plan Problem Two        Most Recent Value   Care Plan for Problem Two  Active   Interventions for Problem Two Long Term Goal   reported legs wounds to MD office Hoyle Sauer), encouraged patient to discuss options like the wound center with MD during next office visit which is today. Reviewed signs of infection.   THN Long Term Goal (31-90) days  Patient will be able to report increase healing in legs in the next 60 days.   THN Long Term Goal Start Date  02/28/15   [facility] Long Term Goal Met Date  05/01/15 [goal met, patient reports legs are healing.]   THN CM Short Term Goal #1 (0-30 days)  Patient will report improvement of healing to legs wounds in the next 30 days.   THN CM Short Term Goal #1 Start Date  02/28/15   [facility] CM Short Term Goal #1 Met Date   05/01/15 [goal met]   Interventions for Short Term Goal #2   Discussed with patient the importance of following the recommendations from MD today. Discussed importance of low salt and leg elevation to decrease swelling   THN CM Short Term Goal #2 (0-30 days)  Patient will be  able to verbalize reasons to call MD for skin concerns in the next 30 days.   THN CM Short Term Goal #2 Start Date  02/28/15   [facility] CM Short Term Goal #2 Met Date  05/01/15 [goal met]   Interventions for Short Term Goal #2  Discussed importance of checking feet daily and observing wounds on legs and reporting any changes to MD.   [facility] CM Short Term Goal #3 (0-30 days)  Patient will be able to report that he got a follow up call from [employer] representative in the next 10 days.   THN CM Short Term Goal #3 Start Date  03/28/15   [facility] CM Short Term Goal #3 Met Date  05/01/15 [goal met. pt spoke with [employer] rep resentative via phone]   Interventions for Short Term Goal #3  Reviewed importance of patient following up with [employer] representative. Provided patient with contact information for his representative Janit Pagan.    THN CM Care Plan Problem Three        Most Recent Value   Care Plan Problem Three  Recent admission related to pneumonia.   Role Documenting the Problem Three  Care Management Coordinator   Care Plan  for Problem Three  Active   THN Long Term Goal (31-90) days  Patient will be able to report decrease in shortness of breath in the next 31 days.   THN Long Term Goal Start Date  05/01/15   Interventions for Problem Three Long Term Goal  Disucssed with patient the importance of recognizing early symptoms of problems and to seek early care.    THN CM Short Term Goal #1 (0-30 days)  Patient will be able to report no admissions in the next 30 days.   THN CM Short Term Goal #1 Start Date  05/01/15   Interventions for Short Term Goal #1  Placed call to pulmonary and got MD appointment move up due to patient continues to not feel well. Encouarged patient to share his concerns about his secretions and shortness of breath. Encouraged patient to call Dr. [name] office if symptoms worsen.   THN CM Short Term Goal #2 (0-30 days)  Patient will be able to verbalize using his incentive spirometer  and flutter value 4 times a day for the next 30 days.   THN CM Short Term Goal #2 Start Date  05/01/15   Interventions for Short Term Goal #2  Discussed importance of using incentive spirometry. Discussed benefits of lungs exercises for patient. Encouraged patient to be mobile. Reviewed symptoms to be concerned about like fever, change in sputum color and increased shortness of breath.     Tomasa Rand, RN, BSN, CEN [facility] [employer] [phone]

## 2015-05-03 ENCOUNTER — Ambulatory Visit (INDEPENDENT_AMBULATORY_CARE_PROVIDER_SITE_OTHER)
Admission: RE | Admit: 2015-05-03 | Discharge: 2015-05-03 | Disposition: A | Discharge status: Home / Self Care | Payer: Medicare Other | Source: Ambulatory Visit | Attending: Adult Health | Admitting: Adult Health

## 2015-05-03 ENCOUNTER — Encounter: Payer: Self-pay | Admitting: Adult Health

## 2015-05-03 ENCOUNTER — Ambulatory Visit (INDEPENDENT_AMBULATORY_CARE_PROVIDER_SITE_OTHER): Payer: Medicare Other | Admitting: Adult Health

## 2015-05-03 VITALS — BP 106/64 | HR 87 | Temp 98.2°F | Ht 67.0 in | Wt 242.0 lb

## 2015-05-03 DIAGNOSIS — R05 Cough: Secondary | ICD-10-CM | POA: Diagnosis not present

## 2015-05-03 DIAGNOSIS — J8489 Other specified interstitial pulmonary diseases: Secondary | ICD-10-CM

## 2015-05-03 DIAGNOSIS — J45909 Unspecified asthma, uncomplicated: Secondary | ICD-10-CM | POA: Diagnosis not present

## 2015-05-03 DIAGNOSIS — J189 Pneumonia, unspecified organism: Secondary | ICD-10-CM

## 2015-05-03 DIAGNOSIS — G4730 Sleep apnea, unspecified: Secondary | ICD-10-CM

## 2015-05-03 DIAGNOSIS — J449 Chronic obstructive pulmonary disease, unspecified: Secondary | ICD-10-CM

## 2015-05-03 DIAGNOSIS — J9611 Chronic respiratory failure with hypoxia: Secondary | ICD-10-CM

## 2015-05-03 MED ORDER — AZITHROMYCIN 250 MG PO TABS
ORAL_TABLET | ORAL | Status: AC
Start: 2015-05-03 — End: 2015-05-08

## 2015-05-03 MED ORDER — ALBUTEROL SULFATE (2.5 MG/3ML) 0.083% IN NEBU: 2.5000 mg | INHALATION_SOLUTION | Freq: Two times a day (BID) | RESPIRATORY_TRACT | Status: DC

## 2015-05-03 NOTE — Progress Notes (Signed)
   Subjective:    Patient ID: Jon [name], male    DOB: [birth year], 64 y.o.   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  Complains of increased SOB, prod cough with brown colored mucus and wheezing.  Denies chest pain , hemoptysis  nausea or vomitting, and fever.  Pt recently admitted to hospital with HCAP and COPD /BOOP flare .He was treated with steroids and abx.  Tx w/  Levaquin 750mg  and Prednisone taper.  Remains on prednisone 30mg  daily .  XRay today shows cleared PNA/infiltrate.    Remains on CPAP At bedtime  . Reports good compliance with no issues .    Review of Systems  Constitutional:   No  weight loss, night sweats,  Fevers, chills,  +fatigue, or  lassitude.  HEENT:   No headaches,  Difficulty swallowing,  Tooth/dental problems, or  Sore throat,                No sneezing, itching, ear ache,  +nasal congestion, post nasal drip,   CV:  No chest pain,  Orthopnea, PND, swelling in lower extremities, anasarca, dizziness, palpitations, syncope.   GI  No heartburn, indigestion, abdominal pain, nausea, vomiting, diarrhea, change in bowel habits, loss of appetite, bloody stools.   Resp:    No chest wall deformity  Skin: no rash or lesions.  GU: no dysuria, change in color of urine, no urgency or frequency.  No flank pain, no hematuria   MS:  No joint pain or swelling.  No decreased range of motion.  No back pain.  Psych:  No change in mood or affect. No depression or anxiety.  No memory loss.          Objective:   Physical Exam GEN: A/Ox3; pleasant , NAD, obese , chronically ill appearing   HEENT:  [place]/AT,  EACs-clear, TMs-wnl, NOSE-clear, THROAT-clear, no lesions, no postnasal drip or exudate noted.   NECK:  Supple w/ fair ROM; no JVD; normal carotid impulses w/o bruits; no thyromegaly or  nodules palpated; no lymphadenopathy.  RESP  Few trace rhonchi, decreased BS in bases  no accessory muscle use, no dullness to percussion  CARD:  RRR, no m/r/g  , Tr-1+   peripheral edema, pulses intact, no cyanosis or clubbing.  GI:   Soft & nt; nml bowel sounds; no organomegaly or masses detected.  Musco: Warm bil, no deformities or joint swelling noted.   Neuro: alert, no focal deficits noted.    Skin: Warm, no lesions or rashes   05/03/15 CXR  Resolved PNA  Reviewed independently     Assessment & Plan:

## 2015-05-03 NOTE — Assessment & Plan Note (Signed)
Recurrent flare   Plan  Zpack take as directed.   May use Mucinex Twice daily  As needed  Cough/congestion .      Follow up Dr. [name]  In 4 weeks and As needed  With chest xray

## 2015-05-03 NOTE — Assessment & Plan Note (Signed)
Cont on CPAP and O2

## 2015-05-03 NOTE — Addendum Note (Signed)
Addended by: Osa Craver on: 05/03/2015 05:23 PM   Modules accepted: Orders

## 2015-05-03 NOTE — Patient Instructions (Addendum)
Zpack take as directed.  Decrease Prednisone 20mg  daily and hold at this dose .  May use Mucinex Twice daily  As needed  Cough/congestion .  Continue on CPAP At bedtime   Wear oxygen 3 l/m with walking and At bedtime   Follow up Dr. [name]  In 4 weeks

## 2015-05-03 NOTE — Assessment & Plan Note (Signed)
Recent flare , cxr is improving  Will try to wean prednisone down to lower dose.   Plan  Decrease Prednisone 20mg  daily and hold at this dose .  Follow up Dr. [name]  In 4 weeks

## 2015-05-03 NOTE — Assessment & Plan Note (Signed)
Cont on O2 .  

## 2015-05-08 ENCOUNTER — Ambulatory Visit: Payer: Medicare Other | Admitting: Critical Care Medicine

## 2015-05-09 DIAGNOSIS — R944 Abnormal results of kidney function studies: Secondary | ICD-10-CM | POA: Diagnosis not present

## 2015-05-09 DIAGNOSIS — E1121 Type 2 diabetes mellitus with diabetic nephropathy: Secondary | ICD-10-CM | POA: Diagnosis not present

## 2015-05-09 DIAGNOSIS — B029 Zoster without complications: Secondary | ICD-10-CM | POA: Diagnosis not present

## 2015-05-11 DIAGNOSIS — I83813 Varicose veins of bilateral lower extremities with pain: Secondary | ICD-10-CM | POA: Diagnosis not present

## 2015-05-11 DIAGNOSIS — I872 Venous insufficiency (chronic) (peripheral): Secondary | ICD-10-CM | POA: Diagnosis not present

## 2015-05-11 DIAGNOSIS — Z9889 Other specified postprocedural states: Secondary | ICD-10-CM | POA: Diagnosis not present

## 2015-05-11 DIAGNOSIS — I83029 Varicose veins of left lower extremity with ulcer of unspecified site: Secondary | ICD-10-CM | POA: Diagnosis not present

## 2015-05-11 DIAGNOSIS — I83019 Varicose veins of right lower extremity with ulcer of unspecified site: Secondary | ICD-10-CM | POA: Diagnosis not present

## 2015-05-11 DIAGNOSIS — I83893 Varicose veins of bilateral lower extremities with other complications: Secondary | ICD-10-CM | POA: Diagnosis not present

## 2015-05-14 ENCOUNTER — Telehealth: Payer: Self-pay | Admitting: Critical Care Medicine

## 2015-05-14 NOTE — Telephone Encounter (Signed)
Called and spoke to pt. Pt stated he was informed by [employer] that he needs a qualifying walk to keep his O2.  LMTCB with answering service at [facility].

## 2015-05-15 ENCOUNTER — Ambulatory Visit: Payer: Medicare Other

## 2015-05-15 ENCOUNTER — Encounter: Payer: Self-pay | Admitting: Critical Care Medicine

## 2015-05-15 DIAGNOSIS — J449 Chronic obstructive pulmonary disease, unspecified: Secondary | ICD-10-CM

## 2015-05-15 NOTE — Progress Notes (Signed)
Qualifying walk completed in [place] office.

## 2015-05-15 NOTE — Telephone Encounter (Signed)
Called [employer] and spoke with Friedens. She reports they are going to be picking up pt tanks for daytime O2 as they never received qualifying sats on this pt. Looked in pt chart and do not see where pt was qualified for daytime O2.  Pt has pending appt with Dr. [name] on 9/26 but Estill Bamberg from [place] reports his O2 will be picked up bc they have no way to bill w/o proper sats Per last OV w/ TP: Wear oxygen 3 l/m with walking and At bedtime  Follow up Dr. [name] In 4 weeks  ---  Dr. [name], is it okay for pt to just come in for a qualifying walk or do you prefer 6MW? thanks

## 2015-05-15 NOTE — Telephone Encounter (Signed)
Can he come to [place] today to see rn only for this ??

## 2015-05-15 NOTE — Telephone Encounter (Signed)
lmomtcb for pt - if he calls back before noon today, can he come to [place] office either at 12 or before for qualification?

## 2015-05-15 NOTE — Telephone Encounter (Signed)
Pt in office this morning in [place].  Please see documentation in the clinical support from today.  Pt did not qualify for ambulatory o2.  Estill Bamberg with [employer] aware.  They will leave pt with o2 concentrator and back up o2 for qhs use, but will need to pick up portable o2.  Pt aware.

## 2015-05-15 NOTE — Telephone Encounter (Signed)
Spoke with Estill Bamberg with [employer].  Pt received portable o2 in error by their service rep.  Reports pt was supposed to receive a backup tank for qhs o2.  However, per chart, pt does use o2 during the daytime and qhs.  Spoke with pt who confirmed this.  Given we do not have qualifying daytime sats, pt will need to come in to see if and what liter flow pt will need.  Pt and Estill Bamberg with [employer] aware.  Pt will come to [place] office now.

## 2015-05-15 NOTE — Telephone Encounter (Signed)
lmtcb for pt.  

## 2015-05-23 DIAGNOSIS — J449 Chronic obstructive pulmonary disease, unspecified: Secondary | ICD-10-CM | POA: Diagnosis not present

## 2015-05-23 DIAGNOSIS — W57XXXA Bitten or stung by nonvenomous insect and other nonvenomous arthropods, initial encounter: Secondary | ICD-10-CM | POA: Diagnosis not present

## 2015-05-23 DIAGNOSIS — B029 Zoster without complications: Secondary | ICD-10-CM | POA: Diagnosis not present

## 2015-05-23 DIAGNOSIS — E1121 Type 2 diabetes mellitus with diabetic nephropathy: Secondary | ICD-10-CM | POA: Diagnosis not present

## 2015-05-23 DIAGNOSIS — L03113 Cellulitis of right upper limb: Secondary | ICD-10-CM | POA: Diagnosis not present

## 2015-05-28 ENCOUNTER — Ambulatory Visit: Payer: Medicare Other | Admitting: Critical Care Medicine

## 2015-05-30 ENCOUNTER — Other Ambulatory Visit: Payer: Self-pay

## 2015-05-30 NOTE — Patient Outreach (Signed)
Case closure: Patient has requested case closure.    Letter sent to MD. In basket message sent to Lurline Del.  Tomasa Rand, RN, BSN, CEN [facility] [employer] [phone]

## 2015-05-30 NOTE — Patient Outreach (Signed)
[facility]) Care Management   05/30/2015  Jon [name] April 13, 1951 [id number]  Jon [name] is an 64 y.o. male 1pm  Arrived for home visit. Patient outside with dog.  Ambulating without difficulty. Wife home and present during home visit. Subjective:  Patient reports that he was admitted about 3 weeks ago with pneumonia and coughing up blood. Patient reports that he also had shingles 3 weeks ago. Patient reports that he has no warning of getting sick until he is in crisis.  Reports today that he feels very good. Denies any problems with breathing at this time. Reports that he had his hospital follow ups with Dr. Tobie Poet.   Patient reports that he is managing his diabetes well. Reports understanding of how to use his insulin pump at this time. Reports that he is wearing his oxygen only at night with his CPAP machine. Reports that he has refused home health nurses.  Patient denies  daily weights.  Reports that he knows that he needs to weigh but just doesn't do it. Patient reports that he is not interested in home visits anymore. States that he does not have any goals that he wants to work on at this time.  States that he knows when to go to the doctor. Reports that he knows how to manage his diabetes. At this point denies any more interest in [facility] program.  Objective:  Reviewed patients home CBG log. Noted that cbg range for mornings are 98-350. This mornings reading of 130. However patient drinking a slushie during home visit and CBG during visit was 350. Patient bolused himself with insulin.  Filed Vitals:   05/30/15 1555  BP: 132/64  Pulse: 93  Resp: 18  Weight: 243 lb (110.224 kg)  SpO2: 96%   Review of Systems  Constitutional: Negative.   HENT: Negative.   Eyes: Negative.   Respiratory: Negative for hemoptysis, shortness of breath and wheezing.   Cardiovascular: Positive for leg swelling.  Gastrointestinal: Negative.   Genitourinary: Negative.   Musculoskeletal:  Negative.   Skin: Negative.   Neurological: Negative.   Endo/Heme/Allergies: Bruises/bleeds easily.  Psychiatric/Behavioral: Negative.     Physical Exam  Constitutional: He is oriented to person, place, and time. He appears well-developed and well-nourished.  Cardiovascular: Normal rate.   Respiratory: Effort normal and breath sounds normal. No respiratory distress.  Lungs clear today  GI: Soft. Bowel sounds are normal.  Musculoskeletal: Normal range of motion. He exhibits edema.  Patient has removed his compression stockings.  Trace edema to the right leg. 1 plus edema to the left leg.  Discoloration noted to both lower legs. No open wounds to feet or legs at this time.  Condition of legs much improved over the last months visits.  Neurological: He is alert and oriented to person, place, and time.  Skin: Skin is warm and dry.  Psychiatric: He has a normal mood and affect. His behavior is normal. Judgment and thought content normal.    Current Medications:   Current Outpatient Prescriptions  Medication Sig Dispense Refill  . albuterol (PROVENTIL HFA;VENTOLIN HFA) 108 (90 BASE) MCG/ACT inhaler Inhale 2 puffs into the lungs every 6 (six) hours as needed for wheezing or shortness of breath.    Marland Kitchen albuterol (PROVENTIL) (2.5 MG/3ML) 0.083% nebulizer solution Take 3 mLs (2.5 mg total) by nebulization 2 (two) times daily. And as needed 75 mL 5  . atorvastatin (LIPITOR) 40 MG tablet Take 40 mg by mouth daily.    Marland Kitchen azelastine (  ASTELIN) 137 MCG/SPRAY nasal spray Place 1 spray into both nostrils at bedtime as needed for allergies.     Marland Kitchen dexlansoprazole (DEXILANT) 60 MG capsule Take 60 mg by mouth daily.    Marland Kitchen diltiazem (CARDIZEM) 30 MG tablet Take 30 mg by mouth 2 (two) times daily.    . famotidine (PEPCID) 20 MG tablet TAKE 1 TABLET BY MOUTH AT BEDTIME 30 tablet 6  . fluticasone (FLONASE) 50 MCG/ACT nasal spray Place 1 spray into both nostrils daily as needed for allergies.     .  Fluticasone-Salmeterol (ADVAIR) 500-50 MCG/DOSE AEPB Inhale 1 puff into the lungs every 12 (twelve) hours. 60 each 11  . hydrALAZINE (APRESOLINE) 50 MG tablet Take 50 mg by mouth. Take 1 tablet in am and 2 tablets at night by mouth    . Insulin Human (INSULIN PUMP) SOLN Inject 1 each into the skin continuous. Inject 0.8 units of novolog every hour per insulin pump.  Dose Increased due to chronic steroid use 1 each 11  . ipratropium (ATROVENT) 0.02 % nebulizer solution Take 0.5 mg by nebulization 4 (four) times daily as needed for wheezing or shortness of breath.    . isosorbide mononitrate (IMDUR) 60 MG 24 hr tablet Take 60 mg by mouth every morning.    . latanoprost (XALATAN) 0.005 % ophthalmic solution Place 1 drop into both eyes at bedtime.    Marland Kitchen losartan (COZAAR) 100 MG tablet Take 1 tablet (100 mg total) by mouth daily. 30 tablet 11  . magnesium oxide (MAG-OX) 400 MG tablet Take 1,600 mg by mouth daily.     . metolazone (ZAROXOLYN) 2.5 MG tablet Take 2.5 mg by mouth daily as needed. For dizzy    . MOVANTIK 25 MG TABS 25 mg. Take 1 tablet by mouth daily    . Multiple Vitamins-Minerals (CENTRUM SILVER PO) Take 1 tablet by mouth daily.    . nitroGLYCERIN (NITROSTAT) 0.4 MG SL tablet Place 0.4 mg under the tongue every 5 (five) minutes as needed. For chest pain    . NOVOLOG 100 UNIT/ML injection Adjusted daily    . OXYCONTIN 10 MG T12A 12 hr tablet Take 10 mg by mouth 2 (two) times daily.    . pantoprazole (PROTONIX) 40 MG tablet Take 40 mg by mouth 2 (two) times daily.    Vladimir Faster Glycol-Propyl Glycol (SYSTANE PRESERVATIVE FREE) 0.4-0.3 % SOLN Apply 1 drop to eye 4 (four) times daily as needed (dryness of eye).     . potassium chloride SA (K-DUR,KLOR-CON) 20 MEQ tablet Take 20 mEq by mouth 3 (three) times daily.    . pramipexole (MIRAPEX) 1.5 MG tablet Take 1.5 mg by mouth at bedtime.    . predniSONE (DELTASONE) 10 MG tablet Take 3 tablets (30 mg total) by mouth daily with breakfast. (Patient  taking differently: Take 30 mg by mouth 2 (two) times daily with a meal. ) 60 tablet 6  . pregabalin (LYRICA) 75 MG capsule Take 1 capsule (75 mg total) by mouth 2 (two) times daily. 60 capsule 0  . Rivaroxaban (XARELTO) 15 MG TABS tablet Take 15 mg by mouth daily.    Marland Kitchen torsemide (DEMADEX) 20 MG tablet Take 20 mg by mouth 2 (two) times daily.    . benzonatate (TESSALON) 100 MG capsule Take 100 mg by mouth as needed.    Marland Kitchen levofloxacin (LEVAQUIN) 750 MG tablet Take 1 tablet by mouth. For 7 days     No current facility-administered medications for this visit.  Functional Status:   In your present state of health, do you have any difficulty performing the following activities: 04/13/2015 03/13/2015  Hearing? N N  Vision? Y Y  Difficulty concentrating or making decisions? N N  Walking or climbing stairs? Y N  Dressing or bathing? N N  Doing errands, shopping? N N  Preparing Food and eating ? N N  Using the Toilet? N N  In the past six months, have you accidently leaked urine? N N  Do you have problems with loss of bowel control? N N  Managing your Medications? N N  Managing your Finances? N N  Housekeeping or managing your Housekeeping? Y N    Fall/Depression Screening:    PHQ 2/9 Scores 04/13/2015 03/13/2015 02/28/2015  PHQ - 2 Score 0 0 0  Exception Documentation Patient refusal - -    Assessment:   (1) recent admission for pneumonia. (2) CBG range as above. Non compliant to diet. Denies any concerns about insulin pump at this time.  (3) swelling noted to legs. Plan:  (1) Refused home health nursing. Has hospital follow up with pulmonary on 06/11/2015.  Finished antibiotics today. (2) encouraged patient to follow diet.  (3) encouraged patient to wear compression hose.  Patient has decided that he no longer has any goals to work on and is no longer interested in [facility] care management.  Therefore will close case and notify MD. Encouraged patient to call [facility] if he changes his mind.  Explained my concern to patient that he has had 2 admission in the last 2 months related to pneumonia.  Patient again states that he doesnot need [facility] program.      [facility] CM Care Plan Problem One        Most Recent Value   Care Plan Problem One  Knowledge deficit related to self management of diabetes.   Role Documenting the Problem One  Care Management [place] for Problem One  Active   [facility] Long Term Goal (31-90 days)  Patient will be able to report feeling more in control of his CBG range in the next 60 days. [update: Patient continues to have concerns about insulin pum]   [facility] Long Term Goal Start Date  02/28/15   [facility] Long Term Goal Met Date  05/01/15 [facility] met. patient is in better control of insulin pump]   Interventions for Problem One Long Term Goal    Reviewed low blood sugar self care, controlling blood sugar, diabetes diet.  Reviewed patients home CBG log,    Placed call to [employer] representative about insulin pump education needed..   [facility] CM Short Term Goal #1 (0-30 days)  Patient will record CBG readings daily for the next 30 days.   [facility] CM Short Term Goal #1 Start Date  02/28/15   [facility] CM Short Term Goal #1 Met Date  03/28/15 [goal met. Patient has been keeping good records.]   Interventions for Short Term Goal #1  Discussed importance of record keeping. discussed signs and symptoms of hypoglycemia, Encouraged patient to discuss his concerns with MD during office visit.   [facility] CM Short Term Goal #2 (0-30 days)  Patient will be able to reports reviewing diabetic diet samples provided in the next 30 days.   [facility] CM Short Term Goal #2 Start Date  02/28/15   [facility] CM Short Term Goal #2 Met Date  03/28/15 Barrie Folk met, patient reports that he reviewed DM material]   Interventions for Short Term Goal #  2  Patient is undecided about his ablilty to change his diet,  Reviewed simple foods to avoid like bread, pasta, potatoes, rice and sweets.    [facility] CM Care Plan Problem Two        Most  Recent Value   Care Plan for Problem Two  Active   Interventions for Problem Two Long Term Goal   reported legs wounds to MD office Hoyle Sauer), encouraged patient to discuss options like the wound center with MD during next office visit which is today. Reviewed signs of infection.   [facility] Long Term Goal (31-90) days  Patient will be able to report increase healing in legs in the next 60 days.   [facility] Long Term Goal Start Date  02/28/15   [facility] Long Term Goal Met Date  05/01/15 [goal met, patient reports legs are healing.]   [facility] CM Short Term Goal #1 (0-30 days)  Patient will report improvement of healing to legs wounds in the next 30 days.   [facility] CM Short Term Goal #1 Start Date  02/28/15   [facility] CM Short Term Goal #1 Met Date   05/01/15 [goal met]   Interventions for Short Term Goal #2   Discussed with patient the importance of following the recommendations from MD today. Discussed importance of low salt and leg elevation to decrease swelling   [facility] CM Short Term Goal #2 (0-30 days)  Patient will be able to verbalize reasons to call MD for skin concerns in the next 30 days.   [facility] CM Short Term Goal #2 Start Date  02/28/15   [facility] CM Short Term Goal #2 Met Date  05/01/15 [goal met]   Interventions for Short Term Goal #2  Discussed importance of checking feet daily and observing wounds on legs and reporting any changes to MD.   [facility] CM Short Term Goal #3 (0-30 days)  Patient will be able to report that he got a follow up call from [employer] representative in the next 10 days.   [facility] CM Short Term Goal #3 Start Date  03/28/15   [facility] CM Short Term Goal #3 Met Date  05/01/15 [goal met. pt spoke with [employer] rep resentative via phone]   Interventions for Short Term Goal #3  Reviewed importance of patient following up with [employer] representative. Provided patient with contact information for his representative Janit Pagan.    [facility] CM Care Plan Problem Three        Most Recent Value   Care Plan Problem Three   Recent admission related to pneumonia.   Role Documenting the Problem Three  Care Management Coordinator   Care Plan for Problem Three  Active   [facility] Long Term Goal (31-90) days  Patient will be able to report decrease in shortness of breath in the next 31 days.   [facility] Long Term Goal Start Date  05/01/15   [facility] Long Term Goal Met Date  05/30/15 [facility] met..reports that he is breathing better]   Interventions for Problem Three Long Term Goal  Disucssed with patient the importance of recognizing early symptoms of problems and to seek early care.    [facility] CM Short Term Goal #1 (0-30 days)  Patient will be able to report no admissions in the next 30 days.   [facility] CM Short Term Goal #1 Start Date  05/01/15   [facility] CM Short Term Goal #1 Met Date  05/30/15 [goal not met. Report admsision for pneumonia.]   Interventions for Short Term Goal #1  Placed call to pulmonary and got MD appointment move up due to patient continues to not feel well. Encouarged patient to share his concerns about his secretions and shortness of breath. Encouraged patient to call Dr. [name] office if symptoms worsen.   [facility] CM Short Term Goal #2 (0-30 days)  Patient will be able to verbalize using his incentive spirometer and flutter value 4 times a day for the next 30 days.   [facility] CM Short Term Goal #2 Start Date  05/01/15   [facility] CM Short Term Goal #2 Met Date  05/30/15 [goal not met.reports he hasn't used his incentive spirometer]   Interventions for Short Term Goal #2  Discussed importance of using incentive spirometry. Discussed benefits of lungs exercises for patient. Encouraged patient to be mobile. Reviewed symptoms to be concerned about like fever, change in sputum color and increased shortness of breath.     Please note goals not met. Patient request case closure.  Tomasa Rand, RN, BSN, CEN [facility] [employer] [phone]

## 2015-06-06 NOTE — Patient Outreach (Signed)
[facility]) Care Management  06/06/2015  Jon [name] 1951-07-26 [id number]   Notification from Tomasa Rand, RN to close case as Patient reports that he is no longer interested in [facility] program.  Thanks, [name]. [place], [place] Assistant Phone: [phone] Fax: [phone]

## 2015-06-08 DIAGNOSIS — I8392 Asymptomatic varicose veins of left lower extremity: Secondary | ICD-10-CM | POA: Diagnosis not present

## 2015-06-08 DIAGNOSIS — I872 Venous insufficiency (chronic) (peripheral): Secondary | ICD-10-CM | POA: Diagnosis not present

## 2015-06-08 DIAGNOSIS — I83892 Varicose veins of left lower extremities with other complications: Secondary | ICD-10-CM | POA: Diagnosis not present

## 2015-06-11 ENCOUNTER — Telehealth: Payer: Self-pay | Admitting: Adult Health

## 2015-06-11 ENCOUNTER — Encounter: Payer: Self-pay | Admitting: Adult Health

## 2015-06-11 ENCOUNTER — Ambulatory Visit (INDEPENDENT_AMBULATORY_CARE_PROVIDER_SITE_OTHER): Payer: Medicare Other | Admitting: Adult Health

## 2015-06-11 VITALS — BP 132/68 | HR 107 | Temp 98.7°F | Ht 67.0 in | Wt 249.0 lb

## 2015-06-11 DIAGNOSIS — Z23 Encounter for immunization: Secondary | ICD-10-CM | POA: Diagnosis not present

## 2015-06-11 DIAGNOSIS — G4730 Sleep apnea, unspecified: Secondary | ICD-10-CM

## 2015-06-11 DIAGNOSIS — G4733 Obstructive sleep apnea (adult) (pediatric): Secondary | ICD-10-CM | POA: Diagnosis not present

## 2015-06-11 DIAGNOSIS — J449 Chronic obstructive pulmonary disease, unspecified: Secondary | ICD-10-CM | POA: Diagnosis not present

## 2015-06-11 DIAGNOSIS — J8489 Other specified interstitial pulmonary diseases: Secondary | ICD-10-CM

## 2015-06-11 MED ORDER — PREDNISONE 10 MG PO TABS: 20.0000 mg | ORAL_TABLET | Freq: Two times a day (BID) | ORAL | Status: DC

## 2015-06-11 NOTE — Assessment & Plan Note (Signed)
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    Plan  Flu shot today .  Continue on Prednisone 20mg  daily .  Follow up Dr. Halford Chessman 2-3  months and As needed  -[place] if schedule is approved , if not will need to be seen in [place] office.

## 2015-06-11 NOTE — Telephone Encounter (Signed)
Spoke with pt. He needs new rx for prednisone to be sent in. This has been taken care of. Nothing further was needed.

## 2015-06-11 NOTE — Assessment & Plan Note (Signed)
Continue on CPAP At bedtime  With oxygen  Download request from [place] .  Follow up Dr. Halford Chessman 2-3  months and As needed  -[place]

## 2015-06-11 NOTE — Progress Notes (Signed)
Subjective:    Patient ID: Jon [name], male    DOB: 27-Dec-1950, 64 y.o.   MRN: [id number]  HPI 64 yo male former smoker with COPD , BOOP , and OSA on nocturnal CPAP/O2  Has DM on insulin pump, and PVD w/ prev CVA . Atrial Fib on Xarelto   TEST :  Left VATS 2015 w/ organizing pneumonia  5/21 auto-immune studies: ESR: 21; ANA: negative; Rheumatic factor: <02; Cyclic citrul peptide antibody, IgG: < 2.0 (neg); anti-scleroderma antibody: <1.0 (neg); Mpo/pr-3 (anca) antibodies: <1.0 (neg); ANCA screen w/ reflex titer: c-ANCA screen, p-ANCA screen and Atypical p-ANCA screen: all negative; Glomerular basement membrane antibodies: <1.0 (neg);  Urine Na 5/28 >> less than 20  CT chest 12/2013 Progressively worsening multilobar bronchopneumonia, most severe in the left upper lobe and left lower lobe, as above. Reactive mediastinal adenopathy  06/11/2015 Follow up: COPD , BOOP, OSA w/ nocturnal CPAP/O2 Pt returns for follow up . Previous patient of Dr. [name]  .  Pt has intermittent hemoptysis dating back to 10/2013 felt to be related to BOOP .  He underwent a left VATS in 2015 with path showing organizing PNA. He has been treated w/ chronic steroids. His xarelto was on hold in 2015 after VATS but had acute CVA off xarelto so he remains on this lifelong. He underwent autoimmune w/up that was unrevealing in 2015 .  Remains on prednisone 43m daily . CXR 05/2015 with COPD changes  He says that intermittently he will see some traces of blood when he coughs, it comes and goes.  No chest pain, fever, orthopnea or increased edema.  He remains on Advair Twice daily  .  Spirometry today shows severe COPD with FEV1 at 39%, ratio 67, FVC 46%.    Remains on CPAP At bedtime  . Reports good compliance with no issues .  We discussed a download. ([employer]) .  Was started on this years ago.    Review of Systems  Constitutional:   No  weight loss, night sweats,  Fevers, chills,  +fatigue, or   lassitude.  HEENT:   No headaches,  Difficulty swallowing,  Tooth/dental problems, or  Sore throat,                No sneezing, itching, ear ache,  +nasal congestion, post nasal drip,   CV:  No chest pain,  Orthopnea, PND, swelling in lower extremities, anasarca, dizziness, palpitations, syncope.   GI  No heartburn, indigestion, abdominal pain, nausea, vomiting, diarrhea, change in bowel habits, loss of appetite, bloody stools.   Resp:    No chest wall deformity  Skin: no rash or lesions.  GU: no dysuria, change in color of urine, no urgency or frequency.  No flank pain, no hematuria   MS:  No joint pain or swelling.  No decreased range of motion.  No back pain.  Psych:  No change in mood or affect. No depression or anxiety.  No memory loss.          Objective:   Physical Exam GEN: A/Ox3; pleasant , NAD, obese , chronically ill appearing   HEENT:  [place]/AT,  EACs-clear, TMs-wnl, NOSE-clear, THROAT-clear, no lesions, no postnasal drip or exudate noted.   NECK:  Supple w/ fair ROM; no JVD; normal carotid impulses w/o bruits; no thyromegaly or nodules palpated; no lymphadenopathy.  RESP  Few trace rhonchi, decreased BS in bases no accessory muscle use, no dullness to percussion  CARD:  RRR, no m/r/g  , Tr-1+  peripheral edema, pulses intact, no cyanosis or clubbing.  GI:   Soft & nt; nml bowel sounds; no organomegaly or masses detected.  Musco: Warm bil, no deformities or joint swelling noted.   Neuro: alert, no focal deficits noted.    Skin: Warm, no lesions or rashes   05/03/15 CXR  Chronic changes       Assessment & Plan:

## 2015-06-11 NOTE — Assessment & Plan Note (Signed)
Compensated on present regimen  May consider adding spiriva , other LAMA in future , would like to decrease steroids if able (dual purpose with BOOP)   Plan  Flu shot today .  Continue on Prednisone 20mg  daily .  Continue on Advair Twice daily   Continue on Atrovent Neb Four times a day  .  Continue on CPAP At bedtime  With oxygen  Download request from [place] .  Follow up Dr. Halford Chessman 2-3  months and As needed  -[place]

## 2015-06-11 NOTE — Patient Instructions (Addendum)
Flu shot today .  Continue on Prednisone 20mg  daily .  Continue on Advair Twice daily   Continue on Atrovent Neb Four times a day  .  Continue on CPAP At bedtime  With oxygen  Download request from [place] .  Follow up Dr. Halford Chessman 2-3  months and As needed  -[place]

## 2015-06-12 DIAGNOSIS — Z7901 Long term (current) use of anticoagulants: Secondary | ICD-10-CM | POA: Diagnosis not present

## 2015-06-12 DIAGNOSIS — I5032 Chronic diastolic (congestive) heart failure: Secondary | ICD-10-CM | POA: Diagnosis not present

## 2015-06-12 DIAGNOSIS — I482 Chronic atrial fibrillation: Secondary | ICD-10-CM | POA: Diagnosis not present

## 2015-06-12 DIAGNOSIS — I10 Essential (primary) hypertension: Secondary | ICD-10-CM | POA: Diagnosis not present

## 2015-06-12 NOTE — Progress Notes (Signed)
Reviewed and agree with assessment/plan. 

## 2015-06-26 ENCOUNTER — Encounter: Payer: Self-pay | Admitting: Adult Health

## 2015-06-26 ENCOUNTER — Ambulatory Visit (INDEPENDENT_AMBULATORY_CARE_PROVIDER_SITE_OTHER): Payer: Medicare Other | Admitting: Adult Health

## 2015-06-26 VITALS — BP 122/66 | HR 102 | Temp 99.1°F | Ht 67.0 in | Wt 251.0 lb

## 2015-06-26 DIAGNOSIS — B029 Zoster without complications: Secondary | ICD-10-CM | POA: Diagnosis not present

## 2015-06-26 HISTORY — DX: Zoster without complications: B02.9

## 2015-06-26 MED ORDER — VALACYCLOVIR HCL 1 G PO TABS: 1000.0000 mg | ORAL_TABLET | Freq: Three times a day (TID) | ORAL | Status: AC

## 2015-06-26 NOTE — Patient Instructions (Addendum)
Valtrex 1000mg  Three times a day  For 1 week.  When the rash is crusted over/dry , can use Zostrix cream over the counter to help with pain.  Tylenol As needed  Pain .  follow up as planned and As needed   Please contact office for sooner follow up if symptoms do not improve or worsen or seek emergency care

## 2015-06-26 NOTE — Progress Notes (Signed)
Subjective:    Patient ID: Jon [name], male    DOB: 1950/11/29, 64 y.o.   MRN: [id number]  HPI 64 yo male former smoker with COPD , BOOP , and OSA on nocturnal CPAP/O2  Has DM on insulin pump, and PVD w/ prev CVA . Atrial Fib on Xarelto   TEST :  Left VATS 2015 w/ organizing pneumonia  5/21 auto-immune studies: ESR: 21; ANA: negative; Rheumatic factor: <67; Cyclic citrul peptide antibody, IgG: < 2.0 (neg); anti-scleroderma antibody: <1.0 (neg); Mpo/pr-3 (anca) antibodies: <1.0 (neg); ANCA screen w/ reflex titer: c-ANCA screen, p-ANCA screen and Atypical p-ANCA screen: all negative; Glomerular basement membrane antibodies: <1.0 (neg);  Urine Na 5/28 >> less than 20  CT chest 12/2013 Progressively worsening multilobar bronchopneumonia, most severe in the left upper lobe and left lower lobe, as above. Reactive mediastinal adenopathy  06/26/2015 Acute OV  Patient presents for an acute office visit. He complains of 3 days ago he started noticing some pain along his right ribs. Then noticed a red, blistery rash developed. He says it is very painful. Describes as a burning sensation . Pain wraps around the front of his right-sided chest all the way to his back. Says that he had shingles many many years ago. Believes that he had the shingles vaccine. He is on chronic steroids.   says overall that his breathing is at baseline. He is followed for COPD and BOOP. Marland Kitchen He has chronic pain and is on Lyrica twice daily.  Review of Systems Constitutional:   No  weight loss, night sweats,  Fevers, chills, + fatigue, or  lassitude.  HEENT:   No headaches,  Difficulty swallowing,  Tooth/dental problems, or  Sore throat,                No sneezing, itching, ear ache, nasal congestion, post nasal drip,   CV:  No chest pain,  Orthopnea, PND, swelling in lower extremities, anasarca, dizziness, palpitations, syncope.   GI  No heartburn, indigestion, abdominal pain, nausea, vomiting, diarrhea, change in  bowel habits, loss of appetite, bloody stools.   Resp:.  No excess mucus, no productive cough,  No non-productive cough,  No coughing up of blood.  No change in color of mucus.  No wheezing.  No chest wall deformity  Skin: +rash   GU: no dysuria, change in color of urine, no urgency or frequency.  No flank pain, no hematuria   MS:  No joint pain or swelling.  No decreased range of motion.  + back pain.  Psych:  No change in mood or affect. No depression or anxiety.  No memory loss.          Objective:   Physical Exam GEN: A/Ox3; pleasant , NAD, elderly Vital signs reviewed  HEENT:  /AT,  EACs-clear, TMs-wnl, NOSE-clear, THROAT-clear, no lesions, no postnasal drip or exudate noted.   NECK:  Supple w/ fair ROM; no JVD; normal carotid impulses w/o bruits; no thyromegaly or nodules palpated; no lymphadenopathy.  RESP  decreased breath sounds in the bases no accessory muscle use, no dullness to percussion  CARD:  RRR, no m/r/g  , no peripheral edema, pulses intact, no cyanosis or clubbing.  GI:   Soft & nt; nml bowel sounds; no organomegaly or masses detected.  Musco: Warm bil, no deformities or joint swelling noted.   Neuro: alert, no focal deficits noted.    Skin: Warm, along the anterior right sided chest wall with a T6 and T7 dermatomal  distribution to the posterior chest wall is a scattered patches of vesicles consistent with herpes zoster        Assessment & Plan:

## 2015-06-26 NOTE — Assessment & Plan Note (Signed)
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 He is already on Lyrica twice daily. If persistent pain/ herpetic neuralgia persists, can consider increasing dose and add Lidoderm patches if needed. Advised him to follow-up with his primary care physician.  Plan Valtrex 1000mg  Three times a day  For 1 week.  When the rash is crusted over/dry , can use Zostrix cream over the counter to help with pain.  Tylenol As needed  Pain .  follow up as planned and As needed   Please contact office for sooner follow up if symptoms do not improve or worsen or seek emergency care

## 2015-06-27 DIAGNOSIS — H538 Other visual disturbances: Secondary | ICD-10-CM | POA: Diagnosis not present

## 2015-06-27 NOTE — Progress Notes (Signed)
Reviewed and agree with assessment/plan. 

## 2015-07-02 DIAGNOSIS — H401132 Primary open-angle glaucoma, bilateral, moderate stage: Secondary | ICD-10-CM | POA: Diagnosis not present

## 2015-07-24 ENCOUNTER — Ambulatory Visit (INDEPENDENT_AMBULATORY_CARE_PROVIDER_SITE_OTHER)
Admission: RE | Admit: 2015-07-24 | Discharge: 2015-07-24 | Disposition: A | Discharge status: Home / Self Care | Payer: Medicare Other | Source: Ambulatory Visit | Attending: Adult Health | Admitting: Adult Health

## 2015-07-24 ENCOUNTER — Ambulatory Visit (INDEPENDENT_AMBULATORY_CARE_PROVIDER_SITE_OTHER): Payer: Medicare Other | Admitting: Adult Health

## 2015-07-24 ENCOUNTER — Other Ambulatory Visit (INDEPENDENT_AMBULATORY_CARE_PROVIDER_SITE_OTHER): Payer: Medicare Other

## 2015-07-24 ENCOUNTER — Telehealth: Payer: Self-pay | Admitting: Pulmonary Disease

## 2015-07-24 ENCOUNTER — Encounter: Payer: Self-pay | Admitting: Adult Health

## 2015-07-24 VITALS — BP 112/60 | HR 103 | Ht 67.0 in | Wt 255.6 lb

## 2015-07-24 DIAGNOSIS — R042 Hemoptysis: Secondary | ICD-10-CM | POA: Diagnosis not present

## 2015-07-24 DIAGNOSIS — J449 Chronic obstructive pulmonary disease, unspecified: Secondary | ICD-10-CM | POA: Diagnosis not present

## 2015-07-24 DIAGNOSIS — J8489 Other specified interstitial pulmonary diseases: Secondary | ICD-10-CM | POA: Diagnosis not present

## 2015-07-24 DIAGNOSIS — B029 Zoster without complications: Secondary | ICD-10-CM

## 2015-07-24 LAB — CBC WITH DIFFERENTIAL/PLATELET
BASOS ABS: 0 10*3/uL (ref 0.0–0.1)
Basophils Relative: 0.3 % (ref 0.0–3.0)
EOS ABS: 0 10*3/uL (ref 0.0–0.7)
Eosinophils Relative: 0 % (ref 0.0–5.0)
HEMATOCRIT: 35.7 % — AB (ref 39.0–52.0)
HEMOGLOBIN: 11.8 g/dL — AB (ref 13.0–17.0)
LYMPHS PCT: 15.1 % (ref 12.0–46.0)
Lymphs Abs: 2.2 10*3/uL (ref 0.7–4.0)
MCHC: 33 g/dL (ref 30.0–36.0)
MCV: 84.9 fl (ref 78.0–100.0)
Monocytes Absolute: 0.6 10*3/uL (ref 0.1–1.0)
Monocytes Relative: 4.5 % (ref 3.0–12.0)
Neutro Abs: 11.5 10*3/uL — ABNORMAL HIGH (ref 1.4–7.7)
Neutrophils Relative %: 80.1 % — ABNORMAL HIGH (ref 43.0–77.0)
PLATELETS: 268 10*3/uL (ref 150.0–400.0)
RBC: 4.21 Mil/uL — ABNORMAL LOW (ref 4.22–5.81)
RDW: 17.1 % — ABNORMAL HIGH (ref 11.5–15.5)
WBC: 14.3 10*3/uL — AB (ref 4.0–10.5)

## 2015-07-24 LAB — SEDIMENTATION RATE: SED RATE: 68 mm/h — AB (ref 0–22)

## 2015-07-24 MED ORDER — AZITHROMYCIN 250 MG PO TABS
ORAL_TABLET | ORAL | Status: AC
Start: 2015-07-24 — End: 2015-07-29

## 2015-07-24 NOTE — Patient Instructions (Addendum)
Try Capasacin cream to area to help with pain . Use as directed.  Zpack take as directed.  Take prednisone 30mg  daily for 1 week then back to 20mg  daily  Labs today  Mcuinex DM Twice daily As needed  Cough/congestion  Chest xray today .  Continue on CPAP At bedtime   Follow up with Dr. Halford Chessman as planned and As needed   Please contact office for sooner follow up if symptoms do not improve or worsen or seek emergency care

## 2015-07-24 NOTE — Telephone Encounter (Signed)
Spoke with pt. States that he has been coughing up blood for 3 days. Would like to be evaluated before the holiday. He has been scheduled for 2:45pm today with TP.

## 2015-07-29 NOTE — Progress Notes (Signed)
Subjective:    Patient ID: Jon [name], male    DOB: 04-14-1951, 64 y.o.   MRN: [id number]  HPI  64 yo male former smoker with COPD , BOOP , and OSA on nocturnal CPAP/O2  Has DM on insulin pump, and PVD w/ prev CVA . Atrial Fib on Xarelto   TEST :  Left VATS 2015 w/ organizing pneumonia  5/21 auto-immune studies: ESR: 21; ANA: negative; Rheumatic factor: <70; Cyclic citrul peptide antibody, IgG: < 2.0 (neg); anti-scleroderma antibody: <1.0 (neg); Mpo/pr-3 (anca) antibodies: <1.0 (neg); ANCA screen w/ reflex titer: c-ANCA screen, p-ANCA screen and Atypical p-ANCA screen: all negative; Glomerular basement membrane antibodies: <1.0 (neg);  Urine Na 5/28 >> less than 20  CT chest 12/2013 Progressively worsening multilobar bronchopneumonia, most severe in the left upper lobe and left lower lobe, as above. Reactive mediastinal adenopathy  07/24/15  Acute OV  Patient presents for an acute office visit. Complains of cough worsening over last couple of days. Has seen some blood mixed in mucus. Has intermittent hemoptysis on xarelto (high risk off xarelto) on chronic steroids since 12/2013. Last flare was in Sept , tx w/ abx.  Improved until recently . Denies fever, exertional chest pain, orthopnea, edema or n/v/d. Currently on prednisone 30m daily .  Was treated for shingles 1 month ago with valtrex.  Rash has healed up but has residual pain /sensitivity.  . He has chronic pain and is on Lyrica twice daily and narcotics.    Review of Systems  Constitutional:   No  weight loss, night sweats,  Fevers, chills, + fatigue, or  lassitude.  HEENT:   No headaches,  Difficulty swallowing,  Tooth/dental problems, or  Sore throat,                No sneezing, itching, ear ache, nasal congestion, post nasal drip,   CV:  No chest pain,  Orthopnea, PND, swelling in lower extremities, anasarca, dizziness, palpitations, syncope.   GI  No heartburn, indigestion, abdominal pain, nausea, vomiting,  diarrhea, change in bowel habits, loss of appetite, bloody stools.   Resp:.  No excess mucus, no productive cough,  No non-productive cough,  No coughing up of blood.  No change in color of mucus.  No wheezing.  No chest wall deformity  Skin: +rash   GU: no dysuria, change in color of urine, no urgency or frequency.  No flank pain, no hematuria   MS:  No joint pain or swelling.  No decreased range of motion.  + back pain.  Psych:  No change in mood or affect. No depression or anxiety.  No memory loss.          Objective:   Physical Exam  GEN: A/Ox3; pleasant , NAD, elderly Vital signs reviewed  HEENT:  [place]/AT,  EACs-clear, TMs-wnl, NOSE-clear, THROAT-clear, no lesions, no postnasal drip or exudate noted.   NECK:  Supple w/ fair ROM; no JVD; normal carotid impulses w/o bruits; no thyromegaly or nodules palpated; no lymphadenopathy.  RESP  decreased breath sounds in the bases no accessory muscle use, no dullness to percussion  CARD:  RRR, no m/r/g  , no peripheral edema, pulses intact, no cyanosis or clubbing.  GI:   Soft & nt; nml bowel sounds; no organomegaly or masses detected.  Musco: Warm bil, no deformities or joint swelling noted.   Neuro: alert, no focal deficits noted.    Skin: Warm, hyperpigmented area along previous herpes zoster rash right anterior chest wall at  T6 and T7        Assessment & Plan:

## 2015-07-29 NOTE — Assessment & Plan Note (Signed)
Healed with valtrex.  PHN , already on chronic pain meds and lyrica  Plan  Try Capasacin cream to area to help with pain . Use as directed.  follow up w/ PCP if not improving  Follow up with Dr. Halford Chessman as planned and As needed   Please contact office for sooner follow up if symptoms do not improve or worsen or seek emergency care

## 2015-07-29 NOTE — Assessment & Plan Note (Addendum)
?  Flare on chronic steroids  Check ESR , and CXR .  If symptoms persist may need to check CT chest .  No frank hemoptysis , cont xarelto for now. Check CBC.   Plan  Take prednisone 56m daily for 1 week then back to 25mdaily  Labs today  Mcuinex DM Twice daily As needed  Cough/congestion  Chest xray today .  Continue on CPAP At bedtime   Follow up with Dr. SoHalford Chessmans planned and As needed   Please contact office for sooner follow up if symptoms do not improve or worsen or seek emergency care

## 2015-07-30 ENCOUNTER — Telehealth: Payer: Self-pay | Admitting: Adult Health

## 2015-07-30 NOTE — Telephone Encounter (Signed)
Spoke with pt - he is requesting cxr and lab results from 07/24/15.  Tammy, please advise.  Thank you.

## 2015-07-31 DIAGNOSIS — M170 Bilateral primary osteoarthritis of knee: Secondary | ICD-10-CM | POA: Diagnosis not present

## 2015-07-31 DIAGNOSIS — I482 Chronic atrial fibrillation: Secondary | ICD-10-CM | POA: Diagnosis not present

## 2015-07-31 DIAGNOSIS — K219 Gastro-esophageal reflux disease without esophagitis: Secondary | ICD-10-CM | POA: Diagnosis not present

## 2015-07-31 DIAGNOSIS — J449 Chronic obstructive pulmonary disease, unspecified: Secondary | ICD-10-CM | POA: Diagnosis not present

## 2015-07-31 DIAGNOSIS — E1121 Type 2 diabetes mellitus with diabetic nephropathy: Secondary | ICD-10-CM | POA: Diagnosis not present

## 2015-07-31 DIAGNOSIS — I10 Essential (primary) hypertension: Secondary | ICD-10-CM | POA: Diagnosis not present

## 2015-07-31 DIAGNOSIS — E782 Mixed hyperlipidemia: Secondary | ICD-10-CM | POA: Diagnosis not present

## 2015-07-31 DIAGNOSIS — G4733 Obstructive sleep apnea (adult) (pediatric): Secondary | ICD-10-CM | POA: Diagnosis not present

## 2015-07-31 DIAGNOSIS — K5901 Slow transit constipation: Secondary | ICD-10-CM | POA: Diagnosis not present

## 2015-08-01 ENCOUNTER — Encounter: Payer: Self-pay | Admitting: Emergency Medicine

## 2015-08-01 ENCOUNTER — Ambulatory Visit (INDEPENDENT_AMBULATORY_CARE_PROVIDER_SITE_OTHER): Payer: Medicare Other | Admitting: Emergency Medicine

## 2015-08-01 VITALS — BP 106/68 | HR 88

## 2015-08-01 DIAGNOSIS — J8489 Other specified interstitial pulmonary diseases: Secondary | ICD-10-CM

## 2015-08-01 DIAGNOSIS — R042 Hemoptysis: Secondary | ICD-10-CM | POA: Diagnosis not present

## 2015-08-01 DIAGNOSIS — J4489 Other specified chronic obstructive pulmonary disease: Secondary | ICD-10-CM

## 2015-08-01 DIAGNOSIS — G4733 Obstructive sleep apnea (adult) (pediatric): Secondary | ICD-10-CM

## 2015-08-01 DIAGNOSIS — J449 Chronic obstructive pulmonary disease, unspecified: Secondary | ICD-10-CM

## 2015-08-01 HISTORY — DX: Hemoptysis: R04.2

## 2015-08-01 NOTE — Assessment & Plan Note (Signed)
Continue ProAir prn, Advair bid.

## 2015-08-01 NOTE — Assessment & Plan Note (Signed)
Continue current CPAP 

## 2015-08-01 NOTE — Assessment & Plan Note (Signed)
Unclear  to me whether the hemoptysis relates to his hx COP. He needs to be re-imaged, will order Ct chest.  He may also need FOB to inspect.

## 2015-08-01 NOTE — Patient Instructions (Addendum)
We will repeat your CT scan of the chest to compare with 2015 Depending on your scan and the pattern of your cough and bleeding, we may need to perform a bronchoscopy to inspect your airways.  Continue your prednisone 20mg  daily.  Continue your xarelto for now.  You may benefit from an ENT evaluation to look for source of bleeding in your nose.  Follow with Dr Halford Chessman or Dr Lamonte Sakai to review your CT scan and decide next steps.

## 2015-08-01 NOTE — Addendum Note (Signed)
Addended by: Desmond Dike C on: 08/01/2015 02:30 PM   Modules accepted: Orders

## 2015-08-01 NOTE — Telephone Encounter (Signed)
This has already been done , please see labs

## 2015-08-01 NOTE — Telephone Encounter (Signed)
TP - please advise. Thanks. 

## 2015-08-01 NOTE — Assessment & Plan Note (Signed)
Etiology unclear, ? A component of epistaxis here given his description. May need FOB as above. Will repeat his CT chest first. If we believe epistaxis then ? ENT eval, ? D/c astelin.

## 2015-08-01 NOTE — Progress Notes (Signed)
   Subjective:    Patient ID: Jon [name], male    DOB: 09/23/1950, 64 y.o.   MRN: [id number]  HPI 64 yo male former smoker with COPD , BOOP , and OSA on nocturnal CPAP/O2  Has DM on insulin pump, and PVD w/ prev CVA . Atrial Fib on Xarelto   TEST :  Left VATS 2015 w/ organizing pneumonia  5/21 auto-immune studies: ESR: 21; ANA: negative; Rheumatic factor: <10; Cyclic citrul peptide antibody, IgG: < 2.0 (neg); anti-scleroderma antibody: <1.0 (neg); Mpo/pr-3 (anca) antibodies: <1.0 (neg); ANCA screen w/ reflex titer: c-ANCA screen, p-ANCA screen and Atypical p-ANCA screen: all negative; Glomerular basement membrane antibodies: <1.0 (neg);  Urine Na 5/28 >> less than 20  CT chest 12/2013 Progressively worsening multilobar bronchopneumonia, most severe in the left upper lobe and left lower lobe, as above. Reactive mediastinal adenopathy  07/24/15  Acute OV  Patient presents for an acute office visit. Complains of cough worsening over last couple of days. Has seen some blood mixed in mucus. Has intermittent hemoptysis on xarelto (high risk off xarelto) on chronic steroids since 12/2013. Last flare was in Sept , tx w/ abx.  Improved until recently . Denies fever, exertional chest pain, orthopnea, edema or n/v/d. Currently on prednisone 20mg daily .  Was treated for shingles 1 month ago with valtrex.  Rash has healed up but has residual pain /sensitivity.  . He has chronic pain and is on Lyrica twice daily and narcotics.   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 He also has OSA on CPAP, atrial fibrillation on chronic anticoagulation, DM, PVD. He was seen one week ago for cough, sputum, mixed with some streaks of blood. In retrospect he has seen some blood in his sputum every few weeks every since his bx in 2015. Was treated with azithro and increase pred to  30mg for for a week, then back to 20mg. He continues to cough, continues to have some blood without mucous. He has seen epistaxis at the same time.     Review of Systems As per HPI      Objective:   Physical Exam  Filed Vitals:   08/01/15 1402  BP: 106/68  Pulse: 88  SpO2: 94%   Gen: Pleasant, well-nourished, in no distress,  normal affect  ENT: No lesions,  mouth clear,  oropharynx clear, no postnasal drip  Neck: No JVD, no TMG, no carotid bruits  Lungs: No use of accessory muscles, no dullness to percussion, clear without rales or rhonchi  Cardiovascular: RRR, heart sounds normal, no murmur or gallops, no peripheral edema  Musculoskeletal: No deformities, no cyanosis or clubbing  Neuro: alert, non focal  Skin: Warm, no lesions or rashes      Assessment & Plan:  BOOP (bronchiolitis obliterans with organizing pneumonia) Unclear  to me whether the hemoptysis relates to his hx COP. He needs to be re-imaged, will order Ct chest.  He may also need FOB to inspect.   Obstructive apnea Continue current CPAP.   Obstructive chronic bronchitis without exacerbation Continue ProAir prn, Advair bid.   Hemoptysis Etiology unclear, ? A component of epistaxis here given his description. May need FOB as above. Will repeat his CT chest first. If we believe epistaxis then ? ENT eval, ? D/c astelin.     

## 2015-08-07 ENCOUNTER — Other Ambulatory Visit: Payer: Self-pay | Admitting: Critical Care Medicine

## 2015-08-08 ENCOUNTER — Other Ambulatory Visit: Payer: Self-pay | Admitting: Critical Care Medicine

## 2015-08-08 ENCOUNTER — Ambulatory Visit (HOSPITAL_COMMUNITY)
Admission: RE | Admit: 2015-08-08 | Discharge: 2015-08-08 | Disposition: A | Discharge status: Home / Self Care | Payer: Medicare Other | Source: Ambulatory Visit | Attending: Emergency Medicine | Admitting: Emergency Medicine

## 2015-08-08 DIAGNOSIS — R042 Hemoptysis: Secondary | ICD-10-CM | POA: Diagnosis not present

## 2015-08-08 DIAGNOSIS — R0602 Shortness of breath: Secondary | ICD-10-CM | POA: Diagnosis not present

## 2015-08-15 DIAGNOSIS — D649 Anemia, unspecified: Secondary | ICD-10-CM | POA: Diagnosis not present

## 2015-08-31 DIAGNOSIS — J690 Pneumonitis due to inhalation of food and vomit: Secondary | ICD-10-CM | POA: Diagnosis not present

## 2015-08-31 DIAGNOSIS — R609 Edema, unspecified: Secondary | ICD-10-CM | POA: Diagnosis not present

## 2015-08-31 DIAGNOSIS — Z7901 Long term (current) use of anticoagulants: Secondary | ICD-10-CM | POA: Diagnosis not present

## 2015-08-31 DIAGNOSIS — R531 Weakness: Secondary | ICD-10-CM | POA: Diagnosis not present

## 2015-08-31 DIAGNOSIS — R042 Hemoptysis: Secondary | ICD-10-CM | POA: Diagnosis not present

## 2015-09-01 DIAGNOSIS — J90 Pleural effusion, not elsewhere classified: Secondary | ICD-10-CM | POA: Diagnosis not present

## 2015-09-01 DIAGNOSIS — I679 Cerebrovascular disease, unspecified: Secondary | ICD-10-CM | POA: Diagnosis present

## 2015-09-01 DIAGNOSIS — G8929 Other chronic pain: Secondary | ICD-10-CM | POA: Diagnosis present

## 2015-09-01 DIAGNOSIS — Z7901 Long term (current) use of anticoagulants: Secondary | ICD-10-CM | POA: Diagnosis not present

## 2015-09-01 DIAGNOSIS — G8194 Hemiplegia, unspecified affecting left nondominant side: Secondary | ICD-10-CM | POA: Diagnosis not present

## 2015-09-01 DIAGNOSIS — Z8673 Personal history of transient ischemic attack (TIA), and cerebral infarction without residual deficits: Secondary | ICD-10-CM | POA: Diagnosis not present

## 2015-09-01 DIAGNOSIS — E1122 Type 2 diabetes mellitus with diabetic chronic kidney disease: Secondary | ICD-10-CM | POA: Diagnosis present

## 2015-09-01 DIAGNOSIS — I639 Cerebral infarction, unspecified: Secondary | ICD-10-CM | POA: Diagnosis not present

## 2015-09-01 DIAGNOSIS — Z9981 Dependence on supplemental oxygen: Secondary | ICD-10-CM | POA: Diagnosis not present

## 2015-09-01 DIAGNOSIS — R042 Hemoptysis: Secondary | ICD-10-CM | POA: Diagnosis not present

## 2015-09-01 DIAGNOSIS — J189 Pneumonia, unspecified organism: Secondary | ICD-10-CM | POA: Diagnosis not present

## 2015-09-01 DIAGNOSIS — Z7952 Long term (current) use of systemic steroids: Secondary | ICD-10-CM | POA: Diagnosis not present

## 2015-09-01 DIAGNOSIS — E039 Hypothyroidism, unspecified: Secondary | ICD-10-CM | POA: Diagnosis present

## 2015-09-01 DIAGNOSIS — Z79891 Long term (current) use of opiate analgesic: Secondary | ICD-10-CM | POA: Diagnosis not present

## 2015-09-01 DIAGNOSIS — I482 Chronic atrial fibrillation: Secondary | ICD-10-CM | POA: Diagnosis present

## 2015-09-01 DIAGNOSIS — R112 Nausea with vomiting, unspecified: Secondary | ICD-10-CM | POA: Diagnosis not present

## 2015-09-01 DIAGNOSIS — Z87891 Personal history of nicotine dependence: Secondary | ICD-10-CM | POA: Diagnosis not present

## 2015-09-01 DIAGNOSIS — G4733 Obstructive sleep apnea (adult) (pediatric): Secondary | ICD-10-CM | POA: Diagnosis present

## 2015-09-01 DIAGNOSIS — J8489 Other specified interstitial pulmonary diseases: Secondary | ICD-10-CM | POA: Diagnosis not present

## 2015-09-01 DIAGNOSIS — I6523 Occlusion and stenosis of bilateral carotid arteries: Secondary | ICD-10-CM | POA: Diagnosis not present

## 2015-09-01 DIAGNOSIS — Z794 Long term (current) use of insulin: Secondary | ICD-10-CM | POA: Diagnosis not present

## 2015-09-01 DIAGNOSIS — J690 Pneumonitis due to inhalation of food and vomit: Secondary | ICD-10-CM | POA: Diagnosis not present

## 2015-09-01 DIAGNOSIS — Z79899 Other long term (current) drug therapy: Secondary | ICD-10-CM | POA: Diagnosis not present

## 2015-09-01 DIAGNOSIS — J45909 Unspecified asthma, uncomplicated: Secondary | ICD-10-CM | POA: Diagnosis present

## 2015-09-01 DIAGNOSIS — I129 Hypertensive chronic kidney disease with stage 1 through stage 4 chronic kidney disease, or unspecified chronic kidney disease: Secondary | ICD-10-CM | POA: Diagnosis present

## 2015-09-01 DIAGNOSIS — J449 Chronic obstructive pulmonary disease, unspecified: Secondary | ICD-10-CM | POA: Diagnosis present

## 2015-09-01 DIAGNOSIS — E1142 Type 2 diabetes mellitus with diabetic polyneuropathy: Secondary | ICD-10-CM | POA: Diagnosis present

## 2015-09-01 DIAGNOSIS — N183 Chronic kidney disease, stage 3 (moderate): Secondary | ICD-10-CM | POA: Diagnosis present

## 2015-09-01 DIAGNOSIS — R531 Weakness: Secondary | ICD-10-CM | POA: Diagnosis not present

## 2015-09-05 ENCOUNTER — Other Ambulatory Visit: Payer: Self-pay | Admitting: Critical Care Medicine

## 2015-09-06 DIAGNOSIS — M170 Bilateral primary osteoarthritis of knee: Secondary | ICD-10-CM | POA: Diagnosis not present

## 2015-09-06 DIAGNOSIS — R262 Difficulty in walking, not elsewhere classified: Secondary | ICD-10-CM | POA: Diagnosis not present

## 2015-09-06 DIAGNOSIS — R2689 Other abnormalities of gait and mobility: Secondary | ICD-10-CM | POA: Diagnosis not present

## 2015-09-06 DIAGNOSIS — M25562 Pain in left knee: Secondary | ICD-10-CM | POA: Diagnosis not present

## 2015-09-06 DIAGNOSIS — M25561 Pain in right knee: Secondary | ICD-10-CM | POA: Diagnosis not present

## 2015-09-12 DIAGNOSIS — M170 Bilateral primary osteoarthritis of knee: Secondary | ICD-10-CM | POA: Diagnosis not present

## 2015-09-12 DIAGNOSIS — E1121 Type 2 diabetes mellitus with diabetic nephropathy: Secondary | ICD-10-CM | POA: Diagnosis not present

## 2015-09-12 DIAGNOSIS — J018 Other acute sinusitis: Secondary | ICD-10-CM | POA: Diagnosis not present

## 2015-09-12 DIAGNOSIS — J690 Pneumonitis due to inhalation of food and vomit: Secondary | ICD-10-CM | POA: Diagnosis not present

## 2015-09-12 DIAGNOSIS — I69352 Hemiplegia and hemiparesis following cerebral infarction affecting left dominant side: Secondary | ICD-10-CM | POA: Diagnosis not present

## 2015-09-12 DIAGNOSIS — D485 Neoplasm of uncertain behavior of skin: Secondary | ICD-10-CM | POA: Diagnosis not present

## 2015-09-12 DIAGNOSIS — I119 Hypertensive heart disease without heart failure: Secondary | ICD-10-CM | POA: Diagnosis not present

## 2015-09-20 DIAGNOSIS — C44319 Basal cell carcinoma of skin of other parts of face: Secondary | ICD-10-CM | POA: Diagnosis not present

## 2015-09-20 DIAGNOSIS — L578 Other skin changes due to chronic exposure to nonionizing radiation: Secondary | ICD-10-CM | POA: Diagnosis not present

## 2015-09-21 DIAGNOSIS — H66002 Acute suppurative otitis media without spontaneous rupture of ear drum, left ear: Secondary | ICD-10-CM | POA: Diagnosis not present

## 2015-09-21 DIAGNOSIS — J018 Other acute sinusitis: Secondary | ICD-10-CM | POA: Diagnosis not present

## 2015-09-21 DIAGNOSIS — J441 Chronic obstructive pulmonary disease with (acute) exacerbation: Secondary | ICD-10-CM | POA: Diagnosis not present

## 2015-09-26 DIAGNOSIS — M25562 Pain in left knee: Secondary | ICD-10-CM | POA: Diagnosis not present

## 2015-09-26 DIAGNOSIS — M1712 Unilateral primary osteoarthritis, left knee: Secondary | ICD-10-CM | POA: Diagnosis not present

## 2015-09-26 DIAGNOSIS — M25561 Pain in right knee: Secondary | ICD-10-CM | POA: Diagnosis not present

## 2015-09-26 DIAGNOSIS — M170 Bilateral primary osteoarthritis of knee: Secondary | ICD-10-CM | POA: Diagnosis not present

## 2015-09-26 DIAGNOSIS — R2689 Other abnormalities of gait and mobility: Secondary | ICD-10-CM | POA: Diagnosis not present

## 2015-09-27 DIAGNOSIS — C44329 Squamous cell carcinoma of skin of other parts of face: Secondary | ICD-10-CM | POA: Diagnosis not present

## 2015-10-01 DIAGNOSIS — M25561 Pain in right knee: Secondary | ICD-10-CM | POA: Diagnosis not present

## 2015-10-01 DIAGNOSIS — M1711 Unilateral primary osteoarthritis, right knee: Secondary | ICD-10-CM | POA: Diagnosis not present

## 2015-10-01 DIAGNOSIS — M170 Bilateral primary osteoarthritis of knee: Secondary | ICD-10-CM | POA: Diagnosis not present

## 2015-10-01 DIAGNOSIS — M25562 Pain in left knee: Secondary | ICD-10-CM | POA: Diagnosis not present

## 2015-10-01 DIAGNOSIS — R2689 Other abnormalities of gait and mobility: Secondary | ICD-10-CM | POA: Diagnosis not present

## 2015-10-03 ENCOUNTER — Encounter: Payer: Self-pay | Admitting: Adult Health

## 2015-10-03 ENCOUNTER — Telehealth: Payer: Self-pay | Admitting: Pulmonary Disease

## 2015-10-03 ENCOUNTER — Ambulatory Visit (INDEPENDENT_AMBULATORY_CARE_PROVIDER_SITE_OTHER): Payer: Medicare Other | Admitting: Adult Health

## 2015-10-03 VITALS — BP 140/70 | HR 88 | Ht 67.0 in | Wt 260.0 lb

## 2015-10-03 DIAGNOSIS — R042 Hemoptysis: Secondary | ICD-10-CM | POA: Diagnosis not present

## 2015-10-03 DIAGNOSIS — J8489 Other specified interstitial pulmonary diseases: Secondary | ICD-10-CM

## 2015-10-03 DIAGNOSIS — I4891 Unspecified atrial fibrillation: Secondary | ICD-10-CM | POA: Diagnosis not present

## 2015-10-03 DIAGNOSIS — J449 Chronic obstructive pulmonary disease, unspecified: Secondary | ICD-10-CM

## 2015-10-03 NOTE — Progress Notes (Signed)
Reviewed and agree with assessment/plan. 

## 2015-10-03 NOTE — Telephone Encounter (Signed)
Pt c/o hemoptysis for past 4 days.  3- 4 times a day with less than a teaspoon each time.  Increased sob.  Primary MD had pt to increase Pred to 30mg /day starting yesterday.  Also using Albuterol HHN every 4 hr.  Has appt with VS on 10/11/15 but needs eval sooner.  Pt to see Ronnie Doss today at 11:00.

## 2015-10-03 NOTE — Patient Instructions (Addendum)
Stop xarelto until seen by Dr. Halford Chessman next week - we will let your cardiologist know  Stop astelin for now  Continue oxygen as needed  Continue prednisone 30mg /day x 2 days, then back to 20mg /day  Continue advair Document frequency, amount, color of blood  Go to ER if bleeding becomes continuous  Call office for sooner follow up if needed

## 2015-10-03 NOTE — Progress Notes (Signed)
Chief Complaint  Patient presents with  . Acute Visit    Reports coughing up blood for the past 4 days. This has happened to him before. Cough is producing blood the size of his thumb.      Tests Left VATS 2015 w/ organizing pneumonia  5/21 auto-immune studies: ESR: 21; ANA: negative; Rheumatic factor: <69; Cyclic citrul peptide antibody, IgG: < 2.0 (neg); anti-scleroderma antibody: <1.0 (neg); Mpo/pr-3 (anca) antibodies: <1.0 (neg); ANCA screen w/ reflex titer: c-ANCA screen, p-ANCA screen and Atypical p-ANCA screen: all negative; Glomerular basement membrane antibodies: <1.0 (neg);  Urine Na 5/28 >> less than 20  CT chest 12/2013 Progressively worsening multilobar bronchopneumonia, most severe in the left upper lobe and left lower lobe, as above. Reactive mediastinal adenopathy CT chest 08/08/15>>>Central patchy airspace disease in the right lower lobe has a peribronchovascular distribution. Imaging features are compatible with bronchopneumonia or atypical infection.   Past medical hx Past Medical History  Diagnosis Date  . Hypothyroidism   . Dysrhythmia     atrial fib takes cardizem,   . Hypertension   . Angina   . Restless leg syndrome   . GERD (gastroesophageal reflux disease)   . Hyperlipemia   . H/O hiatal hernia   . Neuromuscular disorder (HCC)     rls, neuropathy  . Obesity (BMI 30-39.9)   . Pericardial effusion   . Asthma   . Cough     coughing up blood- started last nite, seems to be worse now  . Atrial fibrillation ([place])   . Myocardial infarction [facility])     "one dr says yes; another says no"  . On home oxygen therapy     "2L w/CPAP at bedtime" (01/18/2014)  . Pneumonia 5/15    "several times" (01/18/2014)  . OSA on CPAP     cpap & oxygen  . IDDM (insulin dependent diabetes mellitus) (HCC)     insulin pump   . Stroke [facility]) 2012    denies residual on 01/18/2014  . Arthritis     "knees" (01/18/2014)  . Fibromyalgia      Past surgical hx, Allergies, Family  hx, Social hx all reviewed.  Current Outpatient Prescriptions on File Prior to Visit  Medication Sig  . albuterol (PROVENTIL HFA;VENTOLIN HFA) 108 (90 BASE) MCG/ACT inhaler Inhale 2 puffs into the lungs every 6 (six) hours as needed for wheezing or shortness of breath.  Marland Kitchen albuterol (PROVENTIL) (2.5 MG/3ML) 0.083% nebulizer solution Take 3 mLs (2.5 mg total) by nebulization 2 (two) times daily. And as needed  . atorvastatin (LIPITOR) 40 MG tablet Take 40 mg by mouth daily.  Marland Kitchen azelastine (ASTELIN) 137 MCG/SPRAY nasal spray Place 1 spray into both nostrils at bedtime as needed for allergies.   . benzonatate (TESSALON) 100 MG capsule Take 100 mg by mouth as needed.  Marland Kitchen dexlansoprazole (DEXILANT) 60 MG capsule Take 60 mg by mouth daily.  Marland Kitchen diltiazem (CARDIZEM) 30 MG tablet Take 30 mg by mouth 2 (two) times daily.  . famotidine (PEPCID) 20 MG tablet TAKE 1 TABLET BY MOUTH AT BEDTIME  . fluticasone (FLONASE) 50 MCG/ACT nasal spray Place 1 spray into both nostrils daily as needed for allergies.   . Fluticasone-Salmeterol (ADVAIR) 500-50 MCG/DOSE AEPB Inhale 1 puff into the lungs every 12 (twelve) hours.  . hydrALAZINE (APRESOLINE) 50 MG tablet Take 50 mg by mouth. Take 1 tablet in am and 2 tablets at night by mouth  . Insulin Human (INSULIN PUMP) SOLN Inject 1 each into the skin  continuous. Inject 0.8 units of novolog every hour per insulin pump.  Dose Increased due to chronic steroid use  . ipratropium (ATROVENT) 0.02 % nebulizer solution Take 0.5 mg by nebulization 4 (four) times daily as needed for wheezing or shortness of breath.  . isosorbide mononitrate (IMDUR) 60 MG 24 hr tablet Take 60 mg by mouth every morning.  . latanoprost (XALATAN) 0.005 % ophthalmic solution Place 1 drop into both eyes at bedtime.  Marland Kitchen losartan (COZAAR) 100 MG tablet Take 1 tablet (100 mg total) by mouth daily.  . magnesium oxide (MAG-OX) 400 MG tablet Take 1,600 mg by mouth daily.   . metolazone (ZAROXOLYN) 2.5 MG tablet  Take 2.5 mg by mouth daily as needed. For dizzy  . MOVANTIK 25 MG TABS 25 mg. Take 1 tablet by mouth daily  . Multiple Vitamins-Minerals (CENTRUM SILVER PO) Take 1 tablet by mouth daily.  . nitroGLYCERIN (NITROSTAT) 0.4 MG SL tablet Place 0.4 mg under the tongue every 5 (five) minutes as needed. For chest pain  . NOVOLOG 100 UNIT/ML injection Adjusted daily  . OXYCONTIN 10 MG T12A 12 hr tablet Take 10 mg by mouth 2 (two) times daily.  . OXYCONTIN 20 MG 12 hr tablet   . pantoprazole (PROTONIX) 40 MG tablet Take 40 mg by mouth 2 (two) times daily.  Vladimir Faster Glycol-Propyl Glycol (SYSTANE PRESERVATIVE FREE) 0.4-0.3 % SOLN Apply 1 drop to eye 4 (four) times daily as needed (dryness of eye).   . potassium chloride SA (K-DUR,KLOR-CON) 20 MEQ tablet Take 20 mEq by mouth 3 (three) times daily.  . pramipexole (MIRAPEX) 1.5 MG tablet Take 1.5 mg by mouth at bedtime.  . predniSONE (DELTASONE) 10 MG tablet Take 2 tablets (20 mg total) by mouth 2 (two) times daily with a meal.  . pregabalin (LYRICA) 75 MG capsule Take 1 capsule (75 mg total) by mouth 2 (two) times daily.  . Rivaroxaban (XARELTO) 15 MG TABS tablet Take 15 mg by mouth daily.  Marland Kitchen torsemide (DEMADEX) 20 MG tablet Take 20 mg by mouth 2 (two) times daily.   No current facility-administered medications on file prior to visit.     Vital Signs BP 140/70 mmHg  Pulse 88  Ht '5\' 7"'  (1.702 m)  Wt 260 lb (117.935 kg)  BMI 40.71 kg/m2  SpO2 96%  History of Present Illness Jon [name] is a 65 y.o. male former smoker with COPD, OSA on nocturnal CPAP, AFib on xarelto and BOOP on chronic prednisone.  He was recently admitted at [facility] with PNA per pt.  Just discharged second week of January and completed course of Levaquin.  Finished Z-pac yesterday after having a skin cancer removed from L cheek. Uses O2 at night and PRN daytime with activity.  Presents today with hemoptysis.  He has had this multiple times in the past and it has  resolved "on its own", but feels he is having more blood this time.  Has been ongoing x 3-4 days, occuring 4-5 times daily.  Each episode produces small amount of blood about "the size of my thumb", usually dark red but has been bright red "once or twice".  A bit more DOE than usual but respiratory status is overall near baseline.  Denies epistaxis.  Denies chest pain, orthopnea, BLE edema, purulent sputum, other bleeding.    Physical Exam  General - pleasant, obese male, No distress  ENT - No sinus tenderness, no oral exudate, no LAN, no epistaxis noted Cardiac - s1s2 irreg,  no murmur Chest - resps even non labored on RA, slightly diminished bases, otherwise clear.  No cough or hemoptysis throughout the duration of visit.   Back - No focal tenderness Abd - protuberant, Soft, non-tender Ext - scant BLE edema  Neuro - Normal strength Skin - No rashes Psych - normal mood, and behavior   Assessment/Plan BOOP - Continue prednisone 13m/day (increased by PCP) x2 days then back to 275mday.  Hemoptysis - etiology unclear.  ?r/t COP.  Previously some ?component epistaxis but none at this time.  Will hold astelin for now anyway with slight worsening hemoptysis.  Will hold xarelto x 1 week given increased amount and change in nature of the bleeding.  Will re-eval in 1 week, if ongoing hemoptysis may need inspection FOB.  Advised at length on s/s which should prompt office call v ER.   Afib on Xarelto - CHADS2 vasc score =6. [facility] [place]  Will alert them to holding xarelto.  COPD - cont advair, PRN SABA.  Nocturnal and PRN O2.   Patient Instructions  Stop xarelto until seen by Dr. SoHalford Chessmanext week - we will let your cardiologist know  Stop astelin for now  Continue oxygen as needed  Continue prednisone 3052may x 2 days, then back to [id number]  Continue advair Document frequency, amount, color of blood  Go to ER if bleeding becomes continuous  Call office for sooner  follow up if needed      KatyNickolas Madrid 10/03/2015  11:03 AM

## 2015-10-05 DIAGNOSIS — J410 Simple chronic bronchitis: Secondary | ICD-10-CM | POA: Diagnosis not present

## 2015-10-05 DIAGNOSIS — R0489 Hemorrhage from other sites in respiratory passages: Secondary | ICD-10-CM | POA: Diagnosis not present

## 2015-10-11 ENCOUNTER — Encounter: Payer: Self-pay | Admitting: Pulmonary Disease

## 2015-10-11 ENCOUNTER — Ambulatory Visit (INDEPENDENT_AMBULATORY_CARE_PROVIDER_SITE_OTHER): Payer: Medicare Other | Admitting: Pulmonary Disease

## 2015-10-11 VITALS — BP 132/74 | HR 97 | Ht 67.0 in | Wt 259.2 lb

## 2015-10-11 DIAGNOSIS — R042 Hemoptysis: Secondary | ICD-10-CM | POA: Diagnosis not present

## 2015-10-11 DIAGNOSIS — J449 Chronic obstructive pulmonary disease, unspecified: Secondary | ICD-10-CM | POA: Diagnosis not present

## 2015-10-11 DIAGNOSIS — J189 Pneumonia, unspecified organism: Secondary | ICD-10-CM

## 2015-10-11 DIAGNOSIS — G4733 Obstructive sleep apnea (adult) (pediatric): Secondary | ICD-10-CM

## 2015-10-11 DIAGNOSIS — J8489 Other specified interstitial pulmonary diseases: Secondary | ICD-10-CM | POA: Diagnosis not present

## 2015-10-11 DIAGNOSIS — Z9989 Dependence on other enabling machines and devices: Secondary | ICD-10-CM

## 2015-10-11 NOTE — Progress Notes (Signed)
Current Outpatient Prescriptions on File Prior to Visit  Medication Sig  . albuterol (PROVENTIL HFA;VENTOLIN HFA) 108 (90 BASE) MCG/ACT inhaler Inhale 2 puffs into the lungs every 6 (six) hours as needed for wheezing or shortness of breath.  Marland Kitchen albuterol (PROVENTIL) (2.5 MG/3ML) 0.083% nebulizer solution Take 3 mLs (2.5 mg total) by nebulization 2 (two) times daily. And as needed  . atorvastatin (LIPITOR) 40 MG tablet Take 40 mg by mouth daily.  Marland Kitchen azelastine (ASTELIN) 137 MCG/SPRAY nasal spray Place 1 spray into both nostrils at bedtime as needed for allergies.   . benzonatate (TESSALON) 100 MG capsule Take 100 mg by mouth as needed.  Marland Kitchen dexlansoprazole (DEXILANT) 60 MG capsule Take 60 mg by mouth daily.  Marland Kitchen diltiazem (CARDIZEM) 30 MG tablet Take 30 mg by mouth 2 (two) times daily.  . famotidine (PEPCID) 20 MG tablet TAKE 1 TABLET BY MOUTH AT BEDTIME  . fluticasone (FLONASE) 50 MCG/ACT nasal spray Place 1 spray into both nostrils daily as needed for allergies.   . Fluticasone-Salmeterol (ADVAIR) 500-50 MCG/DOSE AEPB Inhale 1 puff into the lungs every 12 (twelve) hours.  . hydrALAZINE (APRESOLINE) 50 MG tablet Take 50 mg by mouth. Take 1 tablet in am and 2 tablets at night by mouth  . Insulin Human (INSULIN PUMP) SOLN Inject 1 each into the skin continuous. Inject 0.8 units of novolog every hour per insulin pump.  Dose Increased due to chronic steroid use  . ipratropium (ATROVENT) 0.02 % nebulizer solution Take 0.5 mg by nebulization 4 (four) times daily as needed for wheezing or shortness of breath.  . isosorbide mononitrate (IMDUR) 60 MG 24 hr tablet Take 60 mg by mouth every morning.  . latanoprost (XALATAN) 0.005 % ophthalmic solution Place 1 drop into both eyes at bedtime.  Marland Kitchen losartan (COZAAR) 100 MG tablet Take 1 tablet (100 mg total) by mouth daily.  . magnesium oxide (MAG-OX) 400 MG tablet Take 1,600 mg by mouth daily.   . metolazone (ZAROXOLYN) 2.5 MG tablet Take 2.5 mg by mouth daily as  needed. For dizzy  . MOVANTIK 25 MG TABS 25 mg. Take 1 tablet by mouth daily  . Multiple Vitamins-Minerals (CENTRUM SILVER PO) Take 1 tablet by mouth daily.  . nitroGLYCERIN (NITROSTAT) 0.4 MG SL tablet Place 0.4 mg under the tongue every 5 (five) minutes as needed. For chest pain  . NOVOLOG 100 UNIT/ML injection Adjusted daily  . OXYCONTIN 10 MG T12A 12 hr tablet Take 10 mg by mouth 2 (two) times daily.  . OXYCONTIN 20 MG 12 hr tablet   . pantoprazole (PROTONIX) 40 MG tablet Take 40 mg by mouth 2 (two) times daily.  Vladimir Faster Glycol-Propyl Glycol (SYSTANE PRESERVATIVE FREE) 0.4-0.3 % SOLN Apply 1 drop to eye 4 (four) times daily as needed (dryness of eye).   . potassium chloride SA (K-DUR,KLOR-CON) 20 MEQ tablet Take 20 mEq by mouth 3 (three) times daily.  . pramipexole (MIRAPEX) 1.5 MG tablet Take 1.5 mg by mouth at bedtime.  . pregabalin (LYRICA) 75 MG capsule Take 1 capsule (75 mg total) by mouth 2 (two) times daily.  . Rivaroxaban (XARELTO) 15 MG TABS tablet Take 15 mg by mouth daily.  Marland Kitchen torsemide (DEMADEX) 20 MG tablet Take 20 mg by mouth 2 (two) times daily.  . predniSONE (DELTASONE) 10 MG tablet Take 2 tablets (20 mg total) by mouth 2 (two) times daily with a meal. (Patient not taking: Reported on 10/11/2015)   No current facility-administered medications on file prior  to visit.     Chief Complaint  Patient presents with  . Follow-up    1 week ROV per KW. Reports no hemoptysis while on higher dose of Prednisone (30mg ) but reports seeing a little blood since being back down to 20mg . Pt has not coughed up any blood since 10/07/15. States that he alternates 30mg  and 20mg  Pred based on his symptoms. No other complaints.      Tests Serology 01/19/14 >> all negative VATS lung bx 01/24/14 >> organizing pneumonia Echo 01/31/14 >> EF 55 to 60%, mod LA and severe RA dilation Spirometry 06/11/15 >> FEV1 1.27 (39%), FEV1% 67 CT chest 08/08/15 >> ASD RLL  Past medical hx A fib, HTN, HLD,  RLS, GERD, Hypothyroidism, CVA, Fibromyalgia  Past surgical hx, Allergies, Family hx, Social hx all reviewed.  Vital Signs BP 132/74 mmHg  Pulse 97  Ht 5\' 7"  (1.702 m)  Wt 259 lb 3.2 oz (117.572 kg)  BMI 40.59 kg/m2  SpO2 95%  History of Present Illness Jon [name] is a 65 y.o. 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  He was tx for pneumonia at [facility].  He uses O2 at night and prn.  He was seen last week due to persist hemoptysis.  His hemoptysis has improved.  He is on 20 to 30 mg prednisone daily.  His cough and breathing get worse with lower dose of prednisone.  He denies fever.  He gets wheeze at night.  He is using advair bid, and rescue inhaler several times per day.  He is using CPAP and 2 liters oxygen at night.  Physical Exam  General - No distress ENT - No sinus tenderness, no oral exudate, no LAN, MP 3 Cardiac - s1s2 regular, no murmur Chest - scattered rhonchi, no wheeze Back - No focal tenderness Abd - Soft, non-tender Ext - No edema Neuro - Normal strength Skin - No rashes Psych - normal mood, and behavior   Assessment/Plan  Recurrent pneumonia with hemoptysis. Plan: - repeat CT chest w/o contrast and then decide if he needs bronchoscopy  COPD with chronic bronchitis. Plan:  - continue advair and prn albuterol/atrovent  BOOP. Plan: - continue prednisone 30 mg daily for now  A fib. Plan: - okay to resume xarelto  OSA, OHS. Plan: - continue CPAP and 2 liters oxygen at night    Patient Instructions  Okay to resume xarelto  Will schedule CT chest and call with results  Follow up in 6 weeks     [name], MD [facility] Pager:  [phone]

## 2015-10-11 NOTE — Patient Instructions (Signed)
Okay to resume xarelto  Will schedule CT chest and call with results  Follow up in 6 weeks

## 2015-10-16 ENCOUNTER — Ambulatory Visit (INDEPENDENT_AMBULATORY_CARE_PROVIDER_SITE_OTHER)
Admission: RE | Admit: 2015-10-16 | Discharge: 2015-10-16 | Disposition: A | Discharge status: Home / Self Care | Payer: Medicare Other | Source: Ambulatory Visit | Attending: Pulmonary Disease | Admitting: Pulmonary Disease

## 2015-10-16 DIAGNOSIS — R042 Hemoptysis: Secondary | ICD-10-CM

## 2015-10-16 DIAGNOSIS — J8489 Other specified interstitial pulmonary diseases: Secondary | ICD-10-CM | POA: Diagnosis not present

## 2015-10-16 DIAGNOSIS — J4489 Other specified chronic obstructive pulmonary disease: Secondary | ICD-10-CM

## 2015-10-16 DIAGNOSIS — J189 Pneumonia, unspecified organism: Secondary | ICD-10-CM

## 2015-10-16 DIAGNOSIS — J449 Chronic obstructive pulmonary disease, unspecified: Secondary | ICD-10-CM

## 2015-10-25 ENCOUNTER — Telehealth: Payer: Self-pay | Admitting: Pulmonary Disease

## 2015-10-25 DIAGNOSIS — J690 Pneumonitis due to inhalation of food and vomit: Secondary | ICD-10-CM | POA: Diagnosis not present

## 2015-10-25 DIAGNOSIS — J441 Chronic obstructive pulmonary disease with (acute) exacerbation: Secondary | ICD-10-CM | POA: Diagnosis not present

## 2015-10-25 NOTE — Telephone Encounter (Signed)
CT chest 10/16/15 >> minimal Lt pleural thickening, improved aeration RUL and RLL, persistent b/l lower lobe recticulo-nodular opacities, dilated esophagus with distal wall thickening   Left message explaining that PNA improving >> no need for bronchoscopy.  Advised him to d/w surgeon about adjust lap band since he has dilated esophagus which could be setting him up for reflux and aspiration.  Advised him to call back with questions.

## 2015-10-25 NOTE — Telephone Encounter (Signed)
Received call from Dr. Tobie Poet.  Pt has recurrent hemoptysis.  Advised to increased prednisone to 30 mg daily, give Abx for aspiration pneumonia.  Advised to have pt f/u with surgery to assess loosening lap band.  He will call if not improved and then consider bronchoscopy.  Otherwise he has f/u scheduled for April 2017.

## 2015-10-26 ENCOUNTER — Encounter (HOSPITAL_COMMUNITY): Payer: Self-pay

## 2015-10-26 ENCOUNTER — Emergency Department (HOSPITAL_COMMUNITY): Payer: Medicare Other

## 2015-10-26 ENCOUNTER — Telehealth: Payer: Self-pay | Admitting: Pulmonary Disease

## 2015-10-26 ENCOUNTER — Emergency Department (HOSPITAL_COMMUNITY)
Admission: EM | Admit: 2015-10-26 | Discharge: 2015-10-26 | Disposition: A | Discharge status: Home / Self Care | Payer: Medicare Other | Attending: Emergency Medicine | Admitting: Emergency Medicine

## 2015-10-26 DIAGNOSIS — J441 Chronic obstructive pulmonary disease with (acute) exacerbation: Secondary | ICD-10-CM | POA: Diagnosis not present

## 2015-10-26 DIAGNOSIS — G4733 Obstructive sleep apnea (adult) (pediatric): Secondary | ICD-10-CM | POA: Diagnosis not present

## 2015-10-26 DIAGNOSIS — Z794 Long term (current) use of insulin: Secondary | ICD-10-CM | POA: Diagnosis not present

## 2015-10-26 DIAGNOSIS — Z9884 Bariatric surgery status: Secondary | ICD-10-CM | POA: Diagnosis not present

## 2015-10-26 DIAGNOSIS — I252 Old myocardial infarction: Secondary | ICD-10-CM | POA: Diagnosis not present

## 2015-10-26 DIAGNOSIS — E119 Type 2 diabetes mellitus without complications: Secondary | ICD-10-CM | POA: Insufficient documentation

## 2015-10-26 DIAGNOSIS — Z8673 Personal history of transient ischemic attack (TIA), and cerebral infarction without residual deficits: Secondary | ICD-10-CM | POA: Diagnosis not present

## 2015-10-26 DIAGNOSIS — G2581 Restless legs syndrome: Secondary | ICD-10-CM | POA: Diagnosis not present

## 2015-10-26 DIAGNOSIS — Z880 Allergy status to penicillin: Secondary | ICD-10-CM | POA: Diagnosis not present

## 2015-10-26 DIAGNOSIS — M170 Bilateral primary osteoarthritis of knee: Secondary | ICD-10-CM | POA: Diagnosis not present

## 2015-10-26 DIAGNOSIS — R05 Cough: Secondary | ICD-10-CM | POA: Diagnosis not present

## 2015-10-26 DIAGNOSIS — Z7901 Long term (current) use of anticoagulants: Secondary | ICD-10-CM | POA: Insufficient documentation

## 2015-10-26 DIAGNOSIS — Z8701 Personal history of pneumonia (recurrent): Secondary | ICD-10-CM | POA: Insufficient documentation

## 2015-10-26 DIAGNOSIS — Z87891 Personal history of nicotine dependence: Secondary | ICD-10-CM | POA: Insufficient documentation

## 2015-10-26 DIAGNOSIS — I10 Essential (primary) hypertension: Secondary | ICD-10-CM | POA: Insufficient documentation

## 2015-10-26 DIAGNOSIS — Z79899 Other long term (current) drug therapy: Secondary | ICD-10-CM | POA: Insufficient documentation

## 2015-10-26 DIAGNOSIS — J449 Chronic obstructive pulmonary disease, unspecified: Secondary | ICD-10-CM

## 2015-10-26 DIAGNOSIS — Z9889 Other specified postprocedural states: Secondary | ICD-10-CM | POA: Insufficient documentation

## 2015-10-26 DIAGNOSIS — Z9981 Dependence on supplemental oxygen: Secondary | ICD-10-CM | POA: Diagnosis not present

## 2015-10-26 DIAGNOSIS — Z7951 Long term (current) use of inhaled steroids: Secondary | ICD-10-CM | POA: Insufficient documentation

## 2015-10-26 DIAGNOSIS — R0602 Shortness of breath: Secondary | ICD-10-CM | POA: Diagnosis not present

## 2015-10-26 DIAGNOSIS — Z7952 Long term (current) use of systemic steroids: Secondary | ICD-10-CM | POA: Diagnosis not present

## 2015-10-26 DIAGNOSIS — I4891 Unspecified atrial fibrillation: Secondary | ICD-10-CM | POA: Insufficient documentation

## 2015-10-26 DIAGNOSIS — E785 Hyperlipidemia, unspecified: Secondary | ICD-10-CM | POA: Insufficient documentation

## 2015-10-26 DIAGNOSIS — G629 Polyneuropathy, unspecified: Secondary | ICD-10-CM | POA: Insufficient documentation

## 2015-10-26 MED ORDER — ALBUTEROL SULFATE (2.5 MG/3ML) 0.083% IN NEBU
5.0000 mg | INHALATION_SOLUTION | Freq: Once | RESPIRATORY_TRACT | Status: DC
  Filled 2015-10-26: qty 6

## 2015-10-26 MED ORDER — IPRATROPIUM BROMIDE 0.02 % IN SOLN
0.5000 mg | Freq: Once | RESPIRATORY_TRACT | Status: AC
  Administered 2015-10-26: 0.5 mg via RESPIRATORY_TRACT
  Filled 2015-10-26: qty 2.5

## 2015-10-26 MED ORDER — DEXAMETHASONE SODIUM PHOSPHATE 10 MG/ML IJ SOLN
10.0000 mg | Freq: Once | INTRAMUSCULAR | Status: AC
  Administered 2015-10-26: 10 mg via INTRAMUSCULAR
  Filled 2015-10-26: qty 1

## 2015-10-26 MED ORDER — ALBUTEROL SULFATE (2.5 MG/3ML) 0.083% IN NEBU
5.0000 mg | INHALATION_SOLUTION | Freq: Once | RESPIRATORY_TRACT | Status: AC
  Administered 2015-10-26: 5 mg via RESPIRATORY_TRACT

## 2015-10-26 NOTE — Discharge Instructions (Signed)
Use your home nebulizer 4-6 times a day. Call your pulmonologist on Monday to schedule a follow-up visit so that they can adjust your dose of prednisone Chronic Bronchitis Chronic bronchitis is a lasting inflammation of the bronchial tubes, which are the tubes that carry air into your lungs. This is inflammation that occurs:   On most days of the week.   For at least three months at a time.   Over a period of two years in a row. When the bronchial tubes are inflamed, they start to produce mucus. The inflammation and buildup of mucus make it more difficult to breathe. Chronic bronchitis is usually a permanent problem and is one type of chronic obstructive pulmonary disease (COPD). People with chronic bronchitis are at greater risk for getting repeated colds, or respiratory infections. CAUSES  Chronic bronchitis most often occurs in people who have:  Long-standing, severe asthma.  A history of smoking.  Asthma and who also smoke. SIGNS AND SYMPTOMS  Chronic bronchitis may cause the following:   A cough that brings up mucus (productive cough).  Shortness of breath.  Early morning headache.  Wheezing.  Chest discomfort.   Recurring respiratory infections. DIAGNOSIS  Your health care provider may confirm the diagnosis by:  Taking your medical history.  Performing a physical exam.  Taking a chest X-ray.   Performing pulmonary function tests. TREATMENT  Treatment involves controlling symptoms with medicines, oxygen therapy, or making lifestyle changes, such as exercising and eating a healthy, well-balanced diet. Medicines could include:  Inhalers to improve air flow in and out of your lungs.  Antibiotics to treat bacterial infections, such as pneumonia, sinus infections, and acute bronchitis. As a preventative measure, your health care provider may recommend routine vaccinations for influenza and pneumonia. This is to prevent infection and hospitalization since you may be  more at risk for these types of infections.  HOME CARE INSTRUCTIONS  Take medicines only as directed by your health care provider.   If you smoke cigarettes, chew tobacco, or use electronic cigarettes, quit. If you need help quitting, ask your health care provider.  Avoid pollen, dust, animal dander, molds, smoke, and other things that cause shortness of breath or wheezing attacks.  Talk to your health care provider about possible exercise routines. Regular exercise is very important to help you feel better.  If you are prescribed oxygen use at home follow these guidelines:  Never smoke while using oxygen. Oxygen does not burn or explode, but flammable materials will burn faster in the presence of oxygen.  Keep a Data processing manager close by. Let your fire department know that you have oxygen in your home.  Warn visitors not to smoke near you when you are using oxygen. Put up "no smoking" signs in your home where you most often use the oxygen.  Regularly test your smoke detectors at home to make sure they work. If you receive care in your home from a nurse or other health care provider, he or she may also check to make sure your smoke detectors work.  Ask your health care provider whether you would benefit from a pulmonary rehabilitation program.  Do not wait to get medical care if you have any concerning symptoms. Delays could cause permanent injury and may be life threatening. SEEK MEDICAL CARE IF:  You have increased coughing or shortness of breath or both.  You have muscle aches.  You have chest pain.  Your mucus gets thicker.  Your mucus changes from clear or white  to yellow, green, gray, or bloody. SEEK IMMEDIATE MEDICAL CARE IF:  Your usual medicines do not stop your wheezing.   You have increased difficulty breathing.   You have any problems with the medicine you are taking, such as a rash, itching, swelling, or trouble breathing. MAKE SURE YOU:   Understand  these instructions.  Will watch your condition.  Will get help right away if you are not doing well or get worse.   This information is not intended to replace advice given to you by your health care provider. Make sure you discuss any questions you have with your health care provider.   Document Released: 06/05/2006 Document Revised: 09/08/2014 Document Reviewed: 09/26/2013 Elsevier Interactive Patient Education [employer].

## 2015-10-26 NOTE — Telephone Encounter (Signed)
Send order to have his oxygen increased to 5 liters.

## 2015-10-26 NOTE — ED Notes (Signed)
MD at bedside. 

## 2015-10-26 NOTE — Telephone Encounter (Signed)
Spoke with pt, states he needs a new order for extra 02 per another doctor?.the patient difficult to understand over the phone.   Pt c/o wheezing, hemoptysis, sob, fatigue.  Pt does not have a pulse oximeter at home to test O2- currently wearing 3lpm 02 at home.   Pt denies chest pain/tightness. Fever unk.  Per 2/23 phone note pt's hemoptysis was addressed yesterday by VS-hemoptysis unchanged since increasing prednisone to 30mg .   Pt states he does not want to go to ED without talking to VS first.    VS please advise on recs.  Thanks.

## 2015-10-26 NOTE — ED Notes (Signed)
Pt with cough and shortness of breath x 1 week.  Pneumonia in November/december and has had some respiratory changes since.  Pt states coughing up blood with the mucous.  Pt indicates fever of as high as "99.8"

## 2015-10-26 NOTE — ED Notes (Signed)
Bed: WA02 Expected date:  Expected time:  Means of arrival:  Comments: Hold for triage 1 

## 2015-10-26 NOTE — Telephone Encounter (Signed)
He should go to hospital.  He will likely need titration of oxygen from there and might need to be assessed for bronchoscopy.

## 2015-10-26 NOTE — Telephone Encounter (Signed)
Dr Halford Chessman please advise:  (1)   DME [facility] Pt aware that we are going to try and increase O2 to 5liters Pt requesting tanks to be brought out ASAP as his portable tanks are empty.  Called [facility] and spoke with Rodena Piety - states that they only bill for nocturnal O2 and not daytime O2.  Pt did not qualify for daytime O2 per 05/15/15 ambulatory oximetry-done in [place].  They cannot refill any portable tanks he has at home. Pt will need to be re-qualified for exertional O2 here in the office. Were you wanting to increase his night time O2 to 5 liters or was that pertaining to daytime?  (2)   Pt would feels like he still has PNA and does not know if he needs to wait and come in on Monday to see TP or go to ED today.  Pt had PNA in Jan - Admit to [facility] x 3 days. Pt states that his "left lung was completely full". Given Levaquin 750 QD and Pred taper - did not seem to help completely.  PCP- Dr Tobie Poet called in 10/25/15 and VS recommended pred taper again -- pt states that he never received anything and there is nothing in his chart as being recently called in. Pt getting up yellow-green mucus and blood at times. Pt feels that he needs to go to ED.  Please advise Dr Halford Chessman. Thanks.

## 2015-10-26 NOTE — ED Provider Notes (Signed)
CSN: [id number]     Arrival date & time 10/26/15  1828 History   First MD Initiated Contact with Patient 10/26/15 1930     Chief Complaint  Patient presents with  . Shortness of Breath  . Cough     (Consider location/radiation/quality/duration/timing/severity/associated sxs/prior Treatment) HPI Comments: Patient here with cough insurance of breath 1 week. Cough has been associated blood-tinged sputum at times and he has had a temperature at home as high as 99.8. Denies any myalgias. No vomiting or diarrhea. No anginal or CHF symptoms. Does have a history of COPD and has been compliant with his medications. He is chronically on 20 mg a day of prednisone. This is been his current dose for quite a long time. Denies any triggers to his COPD. Saw his physician yesterday and was prescribed Levaquin which she has not filled yet.  Patient is a 65 y.o. male presenting with shortness of breath and cough. The history is provided by the patient and a relative.  Shortness of Breath Associated symptoms: cough   Cough Associated symptoms: shortness of breath     Past Medical History  Diagnosis Date  . Hypothyroidism   . Dysrhythmia     atrial fib takes cardizem,   . Hypertension   . Angina   . Restless leg syndrome   . GERD (gastroesophageal reflux disease)   . Hyperlipemia   . H/O hiatal hernia   . Neuromuscular disorder (HCC)     rls, neuropathy  . Obesity (BMI 30-39.9)   . Pericardial effusion   . Asthma   . Cough     coughing up blood- started last nite, seems to be worse now  . Atrial fibrillation ([place])   . Myocardial infarction [facility])     "one dr says yes; another says no"  . On home oxygen therapy     "2L w/CPAP at bedtime" (01/18/2014)  . Pneumonia 5/15    "several times" (01/18/2014)  . OSA on CPAP     cpap & oxygen  . IDDM (insulin dependent diabetes mellitus) (HCC)     insulin pump   . Stroke [facility]) 2012    denies residual on 01/18/2014  . Arthritis     "knees" (01/18/2014)   . Fibromyalgia    Past Surgical History  Procedure Laterality Date  . Cataract extraction w/ intraocular lens  implant, bilateral Bilateral   . Sinus exploration  X 2  . Appendectomy    . Cholecystectomy    . Laparoscopic gastric banding  2010  . Video bronchoscopy Bilateral 12/29/2013    Procedure: VIDEO BRONCHOSCOPY WITHOUT FLUORO;  Surgeon: Elsie Stain, MD;  Location: [facility] ENDOSCOPY;  Service: Endoscopy;  Laterality: Bilateral;  . Cardiac catheterization  X 2 then 03/03/2012     NL LVF, normal coronaries, vessels are small ([facility]: Dr. Beatrix Fetters)  . Hernia repair      UHR  . Umbilical hernia repair    . Video assisted thoracoscopy Left 01/24/2014    Procedure: VIDEO ASSISTED THORACOSCOPY;  Surgeon: Gaye Pollack, MD;  Location: [facility] OR;  Service: Thoracic;  Laterality: Left;  VATS/open lung biopsy  . Pericardial window Left 01/24/2014    Procedure: PERICARDIAL WINDOW;  Surgeon: Gaye Pollack, MD;  Location: [place];  Service: Thoracic;  Laterality: Left;  . Video bronchoscopy N/A 01/24/2014    Procedure: VIDEO BRONCHOSCOPY;  Surgeon: Gaye Pollack, MD;  Location: [facility] OR;  Service: Thoracic;  Laterality: N/A;   Family History  Problem Relation Age  of Onset  . Anesthesia problems Neg Hx   . Hypotension Neg Hx   . Malignant hyperthermia Neg Hx   . Pseudochol deficiency Neg Hx   . Cancer Mother    Social History  Substance Use Topics  . Smoking status: Former Smoker -- 4 years    Types: Cigars    Quit date: 09/01/2006  . Smokeless tobacco: Never Used  . 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   All other systems reviewed and are negative.     Allergies  Codeine and Penicillins  Home Medications   Prior to Admission medications   Medication Sig Start Date End Date Taking? Authorizing Provider  albuterol (PROVENTIL HFA;VENTOLIN HFA) 108 (90 BASE) MCG/ACT inhaler  Inhale 2 puffs into the lungs every 6 (six) hours as needed for wheezing or shortness of breath.   Yes Historical Provider, MD  albuterol (PROVENTIL) (2.5 MG/3ML) 0.083% nebulizer solution Take 3 mLs (2.5 mg total) by nebulization 2 (two) times daily. And as needed 05/03/15  Yes Tammy S Parrett, NP  atorvastatin (LIPITOR) 40 MG tablet Take 40 mg by mouth daily.   Yes Historical Provider, MD  azelastine (ASTELIN) 137 MCG/SPRAY nasal spray Place 1 spray into both nostrils at bedtime as needed for allergies.    Yes Historical Provider, MD  diltiazem (CARDIZEM) 30 MG tablet Take 30 mg by mouth 2 (two) times daily.   Yes Historical Provider, MD  fluticasone (FLONASE) 50 MCG/ACT nasal spray Place 1 spray into both nostrils daily as needed for allergies.    Yes Historical Provider, MD  Fluticasone-Salmeterol (ADVAIR) 500-50 MCG/DOSE AEPB Inhale 1 puff into the lungs every 12 (twelve) hours. 01/30/15  Yes Elsie Stain, MD  hydrALAZINE (APRESOLINE) 50 MG tablet Take 50 mg by mouth 2 (two) times daily.    Yes Historical Provider, MD  isosorbide mononitrate (IMDUR) 60 MG 24 hr tablet Take 60 mg by mouth every morning.   Yes Historical Provider, MD  latanoprost (XALATAN) 0.005 % ophthalmic solution Place 1 drop into both eyes at bedtime.   Yes Historical Provider, MD  losartan (COZAAR) 100 MG tablet Take 1 tablet (100 mg total) by mouth daily. 01/30/15  Yes Elsie Stain, MD  MOVANTIK 25 MG TABS Take 25 mg by mouth daily.  09/05/14  Yes Historical Provider, MD  OXYCONTIN 20 MG 12 hr tablet Take 20 mg by mouth every 12 (twelve) hours.  07/17/15  Yes Historical Provider, MD  pantoprazole (PROTONIX) 40 MG tablet Take 40 mg by mouth 2 (two) times daily.   Yes Historical Provider, MD  Polyethyl Glycol-Propyl Glycol (SYSTANE PRESERVATIVE FREE) 0.4-0.3 % SOLN Apply 1 drop to eye 4 (four) times daily as needed (dryness of eye).    Yes Historical Provider, MD  potassium chloride SA (K-DUR,KLOR-CON) 20 MEQ tablet Take 20  mEq by mouth 3 (three) times daily.   Yes Historical Provider, MD  predniSONE (DELTASONE) 10 MG tablet Take 2 tablets (20 mg total) by mouth 2 (two) times daily with a meal. Patient taking differently: Take 20 mg by mouth daily with breakfast.  06/11/15  Yes Tammy S Parrett, NP  pregabalin (LYRICA) 75 MG capsule Take 1 capsule (75 mg total) by mouth 2 (two) times daily. 01/05/14  Yes Grace Bushy Minor, NP  Rivaroxaban (XARELTO) 15 MG TABS tablet Take 15 mg by mouth daily.  Yes Historical Provider, MD  torsemide (DEMADEX) 20 MG tablet Take 20 mg by mouth 2 (two) times daily.   Yes Historical Provider, MD  famotidine (PEPCID) 20 MG tablet TAKE 1 TABLET BY MOUTH AT BEDTIME Patient not taking: Reported on 10/26/2015 01/17/15   Elsie Stain, MD  Insulin Human (INSULIN PUMP) SOLN Inject 1 each into the skin continuous. Inject 0.8 units of novolog every hour per insulin pump.  Dose Increased due to chronic steroid use 02/27/15   Elsie Stain, MD  Multiple Vitamins-Minerals (CENTRUM SILVER PO) Take 1 tablet by mouth daily.    Historical Provider, MD  nitroGLYCERIN (NITROSTAT) 0.4 MG SL tablet Place 0.4 mg under the tongue every 5 (five) minutes as needed. For chest pain    Historical Provider, MD   BP 114/66 mmHg  Pulse 96  Temp(Src) 98.3 F (36.8 C) (Oral)  Resp 24  SpO2 95% Physical Exam  Constitutional: He is oriented to person, place, and time. He appears well-developed and well-nourished.  Non-toxic appearance. No distress.  HENT:  Head: Normocephalic and atraumatic.  Eyes: Conjunctivae, EOM and lids are normal. Pupils are equal, round, and reactive to light.  Neck: Normal range of motion. Neck supple. No tracheal deviation present. No thyroid mass present.  Cardiovascular: Normal rate, regular rhythm and normal heart sounds.  Exam reveals no gallop.   No murmur heard. Pulmonary/Chest: Effort normal. No stridor. No respiratory distress. He has decreased breath sounds. He has wheezes. He  has no rhonchi. He has no rales.  Abdominal: Soft. Normal appearance and bowel sounds are normal. He exhibits no distension. There is no tenderness. There is no rebound and no CVA tenderness.  Musculoskeletal: Normal range of motion. He exhibits no edema or tenderness.  Neurological: He is alert and oriented to person, place, and time. He has normal strength. No cranial nerve deficit or sensory deficit. GCS eye subscore is 4. GCS verbal subscore is 5. GCS motor subscore is 6.  Skin: Skin is warm and dry. No abrasion and no rash noted.  Psychiatric: He has a normal mood and affect. His speech is normal and behavior is normal.  Nursing note and vitals reviewed.   ED Course  Procedures (including critical care time) Labs Review Labs Reviewed - No data to display  Imaging Review Dg Chest 2 View  10/26/2015  CLINICAL DATA:  Cough, shortness of breath for 1 week. Recent pneumonia. EXAM: CHEST  2 VIEW COMPARISON:  Chest CT 10/16/2015 FINDINGS: There is hyperinflation of the lungs compatible with COPD. Scarring in the lingula. No confluent airspace opacities. Heart is borderline in size. No effusions. No acute bony abnormality. IMPRESSION: COPD/chronic changes.  No active disease. Electronically Signed   By: Rolm Baptise M.D.   On: 10/26/2015 19:02   I have personally reviewed and evaluated these images and lab results as part of my medical decision-making.   EKG Interpretation   Date/Time:  Friday October 26 2015 18:49:34 EST Ventricular Rate:  84 PR Interval:    QRS Duration: 115 QT Interval:  360 QTC Calculation: 425 R Axis:   80 Text Interpretation:  Atrial fibrillation Nonspecific intraventricular  conduction delay Anterior infarct, old No significant change since last  tracing Confirmed by Tarance Balan  MD, Sheryll Dymek ([id number]) on 10/26/2015 7:31:33 PM      MDM   Final diagnoses:  None    Patient given single IM dose of Decadron here as well as albuterol treatment he feels better. Patient  will contact his pulmonologist  on Monday to have him adjust his current dose of corticosteroids.  Informed patient he needs to increase his albuterol nebulizers  To 4-6 times a day    Lacretia Leigh, MD 10/26/15 2052

## 2015-10-26 NOTE — Telephone Encounter (Signed)
Patient notified of Dr. Juanetta Gosling recommendations. Patient is on his way to ER now. Nothing further needed.

## 2015-10-31 DIAGNOSIS — Z4651 Encounter for fitting and adjustment of gastric lap band: Secondary | ICD-10-CM | POA: Diagnosis not present

## 2015-11-23 DIAGNOSIS — M170 Bilateral primary osteoarthritis of knee: Secondary | ICD-10-CM | POA: Diagnosis not present

## 2015-11-23 DIAGNOSIS — J410 Simple chronic bronchitis: Secondary | ICD-10-CM | POA: Diagnosis not present

## 2015-11-23 DIAGNOSIS — I482 Chronic atrial fibrillation: Secondary | ICD-10-CM | POA: Diagnosis not present

## 2015-11-23 DIAGNOSIS — E782 Mixed hyperlipidemia: Secondary | ICD-10-CM | POA: Diagnosis not present

## 2015-11-23 DIAGNOSIS — E1121 Type 2 diabetes mellitus with diabetic nephropathy: Secondary | ICD-10-CM | POA: Diagnosis not present

## 2015-11-23 DIAGNOSIS — G4733 Obstructive sleep apnea (adult) (pediatric): Secondary | ICD-10-CM | POA: Diagnosis not present

## 2015-11-23 DIAGNOSIS — I10 Essential (primary) hypertension: Secondary | ICD-10-CM | POA: Diagnosis not present

## 2015-12-04 ENCOUNTER — Ambulatory Visit: Payer: Medicare Other | Admitting: Pulmonary Disease

## 2015-12-17 DIAGNOSIS — Z7901 Long term (current) use of anticoagulants: Secondary | ICD-10-CM | POA: Diagnosis not present

## 2015-12-17 DIAGNOSIS — I110 Hypertensive heart disease with heart failure: Secondary | ICD-10-CM | POA: Diagnosis not present

## 2015-12-17 DIAGNOSIS — M25561 Pain in right knee: Secondary | ICD-10-CM | POA: Diagnosis not present

## 2015-12-17 DIAGNOSIS — E785 Hyperlipidemia, unspecified: Secondary | ICD-10-CM | POA: Diagnosis not present

## 2015-12-17 DIAGNOSIS — I2510 Atherosclerotic heart disease of native coronary artery without angina pectoris: Secondary | ICD-10-CM | POA: Diagnosis not present

## 2015-12-17 DIAGNOSIS — G8929 Other chronic pain: Secondary | ICD-10-CM | POA: Diagnosis not present

## 2015-12-17 DIAGNOSIS — I5032 Chronic diastolic (congestive) heart failure: Secondary | ICD-10-CM | POA: Diagnosis not present

## 2015-12-17 DIAGNOSIS — I482 Chronic atrial fibrillation: Secondary | ICD-10-CM | POA: Diagnosis not present

## 2016-01-08 DIAGNOSIS — I10 Essential (primary) hypertension: Secondary | ICD-10-CM | POA: Diagnosis not present

## 2016-01-08 DIAGNOSIS — R531 Weakness: Secondary | ICD-10-CM | POA: Diagnosis not present

## 2016-01-08 DIAGNOSIS — G4733 Obstructive sleep apnea (adult) (pediatric): Secondary | ICD-10-CM | POA: Diagnosis not present

## 2016-01-08 DIAGNOSIS — Z9181 History of falling: Secondary | ICD-10-CM | POA: Diagnosis not present

## 2016-01-08 DIAGNOSIS — E876 Hypokalemia: Secondary | ICD-10-CM | POA: Diagnosis not present

## 2016-01-08 DIAGNOSIS — M170 Bilateral primary osteoarthritis of knee: Secondary | ICD-10-CM | POA: Diagnosis not present

## 2016-01-08 DIAGNOSIS — J410 Simple chronic bronchitis: Secondary | ICD-10-CM | POA: Diagnosis not present

## 2016-01-14 DIAGNOSIS — B079 Viral wart, unspecified: Secondary | ICD-10-CM | POA: Diagnosis not present

## 2016-01-14 DIAGNOSIS — C44329 Squamous cell carcinoma of skin of other parts of face: Secondary | ICD-10-CM | POA: Diagnosis not present

## 2016-01-18 DIAGNOSIS — R262 Difficulty in walking, not elsewhere classified: Secondary | ICD-10-CM | POA: Diagnosis not present

## 2016-01-18 DIAGNOSIS — Z9181 History of falling: Secondary | ICD-10-CM | POA: Diagnosis not present

## 2016-01-18 DIAGNOSIS — R531 Weakness: Secondary | ICD-10-CM | POA: Diagnosis not present

## 2016-01-21 DIAGNOSIS — Z9181 History of falling: Secondary | ICD-10-CM | POA: Diagnosis not present

## 2016-01-21 DIAGNOSIS — R531 Weakness: Secondary | ICD-10-CM | POA: Diagnosis not present

## 2016-01-21 DIAGNOSIS — R262 Difficulty in walking, not elsewhere classified: Secondary | ICD-10-CM | POA: Diagnosis not present

## 2016-01-25 DIAGNOSIS — R262 Difficulty in walking, not elsewhere classified: Secondary | ICD-10-CM | POA: Diagnosis not present

## 2016-01-25 DIAGNOSIS — R531 Weakness: Secondary | ICD-10-CM | POA: Diagnosis not present

## 2016-01-25 DIAGNOSIS — Z9181 History of falling: Secondary | ICD-10-CM | POA: Diagnosis not present

## 2016-01-30 DIAGNOSIS — R531 Weakness: Secondary | ICD-10-CM | POA: Diagnosis not present

## 2016-01-30 DIAGNOSIS — Z9181 History of falling: Secondary | ICD-10-CM | POA: Diagnosis not present

## 2016-01-30 DIAGNOSIS — R262 Difficulty in walking, not elsewhere classified: Secondary | ICD-10-CM | POA: Diagnosis not present

## 2016-02-01 DIAGNOSIS — R262 Difficulty in walking, not elsewhere classified: Secondary | ICD-10-CM | POA: Diagnosis not present

## 2016-02-01 DIAGNOSIS — Z9181 History of falling: Secondary | ICD-10-CM | POA: Diagnosis not present

## 2016-02-01 DIAGNOSIS — R531 Weakness: Secondary | ICD-10-CM | POA: Diagnosis not present

## 2016-02-06 DIAGNOSIS — Z9181 History of falling: Secondary | ICD-10-CM | POA: Diagnosis not present

## 2016-02-06 DIAGNOSIS — R531 Weakness: Secondary | ICD-10-CM | POA: Diagnosis not present

## 2016-02-06 DIAGNOSIS — R262 Difficulty in walking, not elsewhere classified: Secondary | ICD-10-CM | POA: Diagnosis not present

## 2016-02-08 DIAGNOSIS — R531 Weakness: Secondary | ICD-10-CM | POA: Diagnosis not present

## 2016-02-08 DIAGNOSIS — Z9181 History of falling: Secondary | ICD-10-CM | POA: Diagnosis not present

## 2016-02-08 DIAGNOSIS — R262 Difficulty in walking, not elsewhere classified: Secondary | ICD-10-CM | POA: Diagnosis not present

## 2016-02-13 DIAGNOSIS — M1711 Unilateral primary osteoarthritis, right knee: Secondary | ICD-10-CM | POA: Diagnosis not present

## 2016-02-13 DIAGNOSIS — R262 Difficulty in walking, not elsewhere classified: Secondary | ICD-10-CM | POA: Diagnosis not present

## 2016-02-13 DIAGNOSIS — R531 Weakness: Secondary | ICD-10-CM | POA: Diagnosis not present

## 2016-02-13 DIAGNOSIS — Z9181 History of falling: Secondary | ICD-10-CM | POA: Diagnosis not present

## 2016-02-15 DIAGNOSIS — Z9181 History of falling: Secondary | ICD-10-CM | POA: Diagnosis not present

## 2016-02-15 DIAGNOSIS — R531 Weakness: Secondary | ICD-10-CM | POA: Diagnosis not present

## 2016-02-15 DIAGNOSIS — R262 Difficulty in walking, not elsewhere classified: Secondary | ICD-10-CM | POA: Diagnosis not present

## 2016-02-20 DIAGNOSIS — I517 Cardiomegaly: Secondary | ICD-10-CM | POA: Diagnosis not present

## 2016-02-20 DIAGNOSIS — R262 Difficulty in walking, not elsewhere classified: Secondary | ICD-10-CM | POA: Diagnosis not present

## 2016-02-20 DIAGNOSIS — Z01818 Encounter for other preprocedural examination: Secondary | ICD-10-CM | POA: Diagnosis not present

## 2016-02-20 DIAGNOSIS — Z9181 History of falling: Secondary | ICD-10-CM | POA: Diagnosis not present

## 2016-02-20 DIAGNOSIS — E559 Vitamin D deficiency, unspecified: Secondary | ICD-10-CM | POA: Diagnosis not present

## 2016-02-20 DIAGNOSIS — M79609 Pain in unspecified limb: Secondary | ICD-10-CM | POA: Diagnosis not present

## 2016-02-20 DIAGNOSIS — R531 Weakness: Secondary | ICD-10-CM | POA: Diagnosis not present

## 2016-02-20 DIAGNOSIS — Z79899 Other long term (current) drug therapy: Secondary | ICD-10-CM | POA: Diagnosis not present

## 2016-02-20 DIAGNOSIS — R52 Pain, unspecified: Secondary | ICD-10-CM | POA: Diagnosis not present

## 2016-02-21 ENCOUNTER — Ambulatory Visit (INDEPENDENT_AMBULATORY_CARE_PROVIDER_SITE_OTHER): Payer: Medicare Other | Admitting: Pulmonary Disease

## 2016-02-21 ENCOUNTER — Encounter: Payer: Self-pay | Admitting: Pulmonary Disease

## 2016-02-21 VITALS — BP 106/58 | HR 80 | Ht 67.0 in | Wt 274.0 lb

## 2016-02-21 DIAGNOSIS — G4733 Obstructive sleep apnea (adult) (pediatric): Secondary | ICD-10-CM

## 2016-02-21 DIAGNOSIS — J189 Pneumonia, unspecified organism: Secondary | ICD-10-CM

## 2016-02-21 DIAGNOSIS — J9611 Chronic respiratory failure with hypoxia: Secondary | ICD-10-CM

## 2016-02-21 DIAGNOSIS — Z9989 Dependence on other enabling machines and devices: Secondary | ICD-10-CM

## 2016-02-21 DIAGNOSIS — J8489 Other specified interstitial pulmonary diseases: Secondary | ICD-10-CM

## 2016-02-21 DIAGNOSIS — J449 Chronic obstructive pulmonary disease, unspecified: Secondary | ICD-10-CM | POA: Diagnosis not present

## 2016-02-21 DIAGNOSIS — Z01810 Encounter for preprocedural cardiovascular examination: Secondary | ICD-10-CM | POA: Diagnosis not present

## 2016-02-21 MED ORDER — PREDNISONE 10 MG PO TABS: ORAL_TABLET | ORAL | Status: DC

## 2016-02-21 NOTE — Patient Instructions (Signed)
Prednisone 20 mg pill >> 1.5 daily for 2 weeks, then 1 pill daily   Follow up in 3 months

## 2016-02-21 NOTE — Progress Notes (Signed)
Current Outpatient Prescriptions on File Prior to Visit  Medication Sig  . albuterol (PROVENTIL HFA;VENTOLIN HFA) 108 (90 BASE) MCG/ACT inhaler Inhale 2 puffs into the lungs every 6 (six) hours as needed for wheezing or shortness of breath.  Marland Kitchen albuterol (PROVENTIL) (2.5 MG/3ML) 0.083% nebulizer solution Take 3 mLs (2.5 mg total) by nebulization 2 (two) times daily. And as needed  . atorvastatin (LIPITOR) 40 MG tablet Take 40 mg by mouth daily.  Marland Kitchen azelastine (ASTELIN) 137 MCG/SPRAY nasal spray Place 1 spray into both nostrils at bedtime as needed for allergies.   Marland Kitchen diltiazem (CARDIZEM) 30 MG tablet Take 30 mg by mouth 2 (two) times daily.  . famotidine (PEPCID) 20 MG tablet TAKE 1 TABLET BY MOUTH AT BEDTIME  . fluticasone (FLONASE) 50 MCG/ACT nasal spray Place 1 spray into both nostrils daily as needed for allergies.   . Fluticasone-Salmeterol (ADVAIR) 500-50 MCG/DOSE AEPB Inhale 1 puff into the lungs every 12 (twelve) hours.  . hydrALAZINE (APRESOLINE) 50 MG tablet Take 50 mg by mouth 2 (two) times daily.   . Insulin Human (INSULIN PUMP) SOLN Inject 1 each into the skin continuous. Inject 0.8 units of novolog every hour per insulin pump.  Dose Increased due to chronic steroid use  . isosorbide mononitrate (IMDUR) 60 MG 24 hr tablet Take 60 mg by mouth every morning.  . latanoprost (XALATAN) 0.005 % ophthalmic solution Place 1 drop into both eyes at bedtime.  Marland Kitchen losartan (COZAAR) 100 MG tablet Take 1 tablet (100 mg total) by mouth daily.  Marland Kitchen MOVANTIK 25 MG TABS Take 25 mg by mouth daily.   . Multiple Vitamins-Minerals (CENTRUM SILVER PO) Take 1 tablet by mouth daily.  . nitroGLYCERIN (NITROSTAT) 0.4 MG SL tablet Place 0.4 mg under the tongue every 5 (five) minutes as needed. For chest pain  . OXYCONTIN 20 MG 12 hr tablet Take 20 mg by mouth every 12 (twelve) hours.   . pantoprazole (PROTONIX) 40 MG tablet Take 40 mg by mouth 2 (two) times daily.  Vladimir Faster Glycol-Propyl Glycol (SYSTANE  PRESERVATIVE FREE) 0.4-0.3 % SOLN Apply 1 drop to eye 4 (four) times daily as needed (dryness of eye).   . pregabalin (LYRICA) 75 MG capsule Take 1 capsule (75 mg total) by mouth 2 (two) times daily.  . Rivaroxaban (XARELTO) 15 MG TABS tablet Take 15 mg by mouth daily.  Marland Kitchen torsemide (DEMADEX) 20 MG tablet Take 20 mg by mouth 2 (two) times daily.   No current facility-administered medications on file prior to visit.     Chief Complaint  Patient presents with  . Follow-up    Needs surgical clearance for upcoming knee surgery. No hemoptysis since last OV. Using CPAP nightly with 2 Liters O2. Denies problems with mask/pressure. DME: [facility]     Tests Serology 01/19/14 >> all negative VATS lung bx 01/24/14 >> organizing pneumonia Echo 01/31/14 >> EF 55 to 60%, mod LA and severe RA dilation Spirometry 06/11/15 >> FEV1 1.27 (39%), FEV1% 67 CT chest 08/08/15 >> ASD RLL CT chest 10/16/15 >> minimal Lt pleural thickening, improved aeration RUL and RLL, persistent b/l lower lobe recticulo-nodular opacities, dilated esophagus with distal wall thickening  Past medical hx A fib, HTN, HLD, RLS, GERD, Hypothyroidism, CVA, Fibromyalgia  Past surgical hx, Allergies, Family hx, Social hx all reviewed.  Vital Signs BP 106/58 mmHg  Pulse 80  Ht 5\' 7"  (1.702 m)  Wt 274 lb (124.286 kg)  BMI 42.90 kg/m2  SpO2 95%  History of Present Illness  Jon [name] is a 65 y.o. 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  Since his last visit he had lap band loosened.  He is not having reflux or vomiting as much.  With this his cough and hemoptysis have improved.  He still has cough, but phlegm is clear.  He is not having fever, wheeze, or chest pain.  He remains on 40 mg prednisone daily.  He is being Dr. Joya Salm for right knee pain and will need to have surgery for this.  His knee pain is quite severe, and significantly impacts his quality of life.  He is using CPAP and 2  liters oxygen at night.   Physical Exam  General - No distress ENT - No sinus tenderness, no oral exudate, no LAN, MP 3 Cardiac - s1s2 regular, no murmur Chest - scattered rhonchi, no wheeze Back - No focal tenderness Abd - Soft, non-tender Ext - No edema Neuro - Normal strength Skin - No rashes Psych - normal mood, and behavior   Assessment/Plan  Recurrent pneumonia with hemoptysis likely from recurrent aspiration. - much improved since his lap band was loosened - continue bronchial hygiene  COPD with chronic bronchitis. - continue advair and prn albuterol/atrovent  BOOP. - decrease prednisone to 30 mg daily for 2 weeks, and then 20 mg daily  Chronic respiratory failure from OSA, OHS. - continue CPAP and 2 liters oxygen at night  Right knee pain. - advised he can proceed with surgery - he will need to continue his inhaler regimen after surgery - he will need to continue CPAP and supplemental oxygen after surgery - continue prednisone >> if he develops hemodynamic compromise after surgery, then he might need stress dose solu cortef at 50 mg q6h until hemodynamics improve - discussed with him risk of prolonged need for ventilatory support, risk of infection and wound healing delay with chronic prednisone therapy   Patient Instructions  Prednisone 20 mg pill >> 1.5 daily for 2 weeks, then 1 pill daily   Follow up in 3 months     [name], MD [facility]/Sleep Pager:  [phone]

## 2016-02-22 DIAGNOSIS — R262 Difficulty in walking, not elsewhere classified: Secondary | ICD-10-CM | POA: Diagnosis not present

## 2016-02-22 DIAGNOSIS — Z9181 History of falling: Secondary | ICD-10-CM | POA: Diagnosis not present

## 2016-02-22 DIAGNOSIS — R531 Weakness: Secondary | ICD-10-CM | POA: Diagnosis not present

## 2016-02-24 DIAGNOSIS — Z01810 Encounter for preprocedural cardiovascular examination: Secondary | ICD-10-CM | POA: Insufficient documentation

## 2016-02-24 HISTORY — DX: Encounter for preprocedural cardiovascular examination: Z01.810

## 2016-02-25 DIAGNOSIS — I10 Essential (primary) hypertension: Secondary | ICD-10-CM | POA: Diagnosis not present

## 2016-02-25 DIAGNOSIS — L03116 Cellulitis of left lower limb: Secondary | ICD-10-CM | POA: Diagnosis not present

## 2016-02-25 DIAGNOSIS — E038 Other specified hypothyroidism: Secondary | ICD-10-CM | POA: Diagnosis not present

## 2016-02-25 DIAGNOSIS — E1121 Type 2 diabetes mellitus with diabetic nephropathy: Secondary | ICD-10-CM | POA: Diagnosis not present

## 2016-02-25 DIAGNOSIS — I482 Chronic atrial fibrillation: Secondary | ICD-10-CM | POA: Diagnosis not present

## 2016-02-25 DIAGNOSIS — G4733 Obstructive sleep apnea (adult) (pediatric): Secondary | ICD-10-CM | POA: Diagnosis not present

## 2016-02-25 DIAGNOSIS — Z7901 Long term (current) use of anticoagulants: Secondary | ICD-10-CM | POA: Diagnosis not present

## 2016-02-25 DIAGNOSIS — I2510 Atherosclerotic heart disease of native coronary artery without angina pectoris: Secondary | ICD-10-CM | POA: Diagnosis not present

## 2016-02-25 DIAGNOSIS — Z01810 Encounter for preprocedural cardiovascular examination: Secondary | ICD-10-CM | POA: Diagnosis not present

## 2016-02-25 DIAGNOSIS — I5032 Chronic diastolic (congestive) heart failure: Secondary | ICD-10-CM | POA: Diagnosis not present

## 2016-02-28 DIAGNOSIS — I482 Chronic atrial fibrillation: Secondary | ICD-10-CM | POA: Diagnosis not present

## 2016-02-28 DIAGNOSIS — J410 Simple chronic bronchitis: Secondary | ICD-10-CM | POA: Diagnosis not present

## 2016-02-28 DIAGNOSIS — G4733 Obstructive sleep apnea (adult) (pediatric): Secondary | ICD-10-CM | POA: Diagnosis not present

## 2016-02-28 DIAGNOSIS — Z01810 Encounter for preprocedural cardiovascular examination: Secondary | ICD-10-CM | POA: Diagnosis not present

## 2016-02-28 DIAGNOSIS — E782 Mixed hyperlipidemia: Secondary | ICD-10-CM | POA: Diagnosis not present

## 2016-02-28 DIAGNOSIS — E1121 Type 2 diabetes mellitus with diabetic nephropathy: Secondary | ICD-10-CM | POA: Diagnosis not present

## 2016-02-28 DIAGNOSIS — M170 Bilateral primary osteoarthritis of knee: Secondary | ICD-10-CM | POA: Diagnosis not present

## 2016-02-28 DIAGNOSIS — I119 Hypertensive heart disease without heart failure: Secondary | ICD-10-CM | POA: Diagnosis not present

## 2016-03-13 DIAGNOSIS — E11622 Type 2 diabetes mellitus with other skin ulcer: Secondary | ICD-10-CM | POA: Diagnosis not present

## 2016-03-13 DIAGNOSIS — R42 Dizziness and giddiness: Secondary | ICD-10-CM | POA: Diagnosis not present

## 2016-03-13 DIAGNOSIS — I517 Cardiomegaly: Secondary | ICD-10-CM | POA: Diagnosis not present

## 2016-03-13 DIAGNOSIS — R05 Cough: Secondary | ICD-10-CM | POA: Diagnosis not present

## 2016-03-13 DIAGNOSIS — E1121 Type 2 diabetes mellitus with diabetic nephropathy: Secondary | ICD-10-CM | POA: Diagnosis not present

## 2016-03-13 DIAGNOSIS — R600 Localized edema: Secondary | ICD-10-CM | POA: Diagnosis not present

## 2016-03-20 DIAGNOSIS — E1142 Type 2 diabetes mellitus with diabetic polyneuropathy: Secondary | ICD-10-CM | POA: Diagnosis not present

## 2016-03-20 DIAGNOSIS — L97921 Non-pressure chronic ulcer of unspecified part of left lower leg limited to breakdown of skin: Secondary | ICD-10-CM | POA: Diagnosis not present

## 2016-03-20 DIAGNOSIS — E876 Hypokalemia: Secondary | ICD-10-CM | POA: Diagnosis not present

## 2016-03-20 DIAGNOSIS — M170 Bilateral primary osteoarthritis of knee: Secondary | ICD-10-CM | POA: Diagnosis not present

## 2016-03-20 DIAGNOSIS — L97911 Non-pressure chronic ulcer of unspecified part of right lower leg limited to breakdown of skin: Secondary | ICD-10-CM | POA: Diagnosis not present

## 2016-03-20 DIAGNOSIS — R600 Localized edema: Secondary | ICD-10-CM | POA: Diagnosis not present

## 2016-03-25 DIAGNOSIS — I87313 Chronic venous hypertension (idiopathic) with ulcer of bilateral lower extremity: Secondary | ICD-10-CM | POA: Diagnosis not present

## 2016-03-25 DIAGNOSIS — I872 Venous insufficiency (chronic) (peripheral): Secondary | ICD-10-CM | POA: Diagnosis not present

## 2016-03-25 DIAGNOSIS — L97222 Non-pressure chronic ulcer of left calf with fat layer exposed: Secondary | ICD-10-CM | POA: Diagnosis not present

## 2016-03-25 DIAGNOSIS — L97821 Non-pressure chronic ulcer of other part of left lower leg limited to breakdown of skin: Secondary | ICD-10-CM | POA: Diagnosis not present

## 2016-03-25 DIAGNOSIS — L97212 Non-pressure chronic ulcer of right calf with fat layer exposed: Secondary | ICD-10-CM | POA: Diagnosis not present

## 2016-03-25 DIAGNOSIS — E119 Type 2 diabetes mellitus without complications: Secondary | ICD-10-CM | POA: Diagnosis not present

## 2016-03-25 DIAGNOSIS — L97811 Non-pressure chronic ulcer of other part of right lower leg limited to breakdown of skin: Secondary | ICD-10-CM | POA: Diagnosis not present

## 2016-04-01 DIAGNOSIS — L97811 Non-pressure chronic ulcer of other part of right lower leg limited to breakdown of skin: Secondary | ICD-10-CM | POA: Diagnosis not present

## 2016-04-01 DIAGNOSIS — I87313 Chronic venous hypertension (idiopathic) with ulcer of bilateral lower extremity: Secondary | ICD-10-CM | POA: Diagnosis not present

## 2016-04-01 DIAGNOSIS — L97821 Non-pressure chronic ulcer of other part of left lower leg limited to breakdown of skin: Secondary | ICD-10-CM | POA: Diagnosis not present

## 2016-04-01 DIAGNOSIS — I872 Venous insufficiency (chronic) (peripheral): Secondary | ICD-10-CM | POA: Diagnosis not present

## 2016-04-01 DIAGNOSIS — E119 Type 2 diabetes mellitus without complications: Secondary | ICD-10-CM | POA: Diagnosis not present

## 2016-04-03 DIAGNOSIS — I482 Chronic atrial fibrillation: Secondary | ICD-10-CM | POA: Diagnosis not present

## 2016-04-03 DIAGNOSIS — E1121 Type 2 diabetes mellitus with diabetic nephropathy: Secondary | ICD-10-CM | POA: Diagnosis not present

## 2016-04-03 DIAGNOSIS — R5383 Other fatigue: Secondary | ICD-10-CM | POA: Diagnosis not present

## 2016-04-03 DIAGNOSIS — I951 Orthostatic hypotension: Secondary | ICD-10-CM | POA: Diagnosis not present

## 2016-04-03 DIAGNOSIS — I119 Hypertensive heart disease without heart failure: Secondary | ICD-10-CM | POA: Diagnosis not present

## 2016-04-10 DIAGNOSIS — I5032 Chronic diastolic (congestive) heart failure: Secondary | ICD-10-CM | POA: Diagnosis not present

## 2016-04-10 DIAGNOSIS — I87311 Chronic venous hypertension (idiopathic) with ulcer of right lower extremity: Secondary | ICD-10-CM | POA: Diagnosis not present

## 2016-04-10 DIAGNOSIS — L97821 Non-pressure chronic ulcer of other part of left lower leg limited to breakdown of skin: Secondary | ICD-10-CM | POA: Diagnosis not present

## 2016-04-10 DIAGNOSIS — I872 Venous insufficiency (chronic) (peripheral): Secondary | ICD-10-CM | POA: Diagnosis not present

## 2016-04-10 DIAGNOSIS — E1121 Type 2 diabetes mellitus with diabetic nephropathy: Secondary | ICD-10-CM | POA: Diagnosis not present

## 2016-04-10 DIAGNOSIS — I951 Orthostatic hypotension: Secondary | ICD-10-CM | POA: Diagnosis not present

## 2016-04-10 DIAGNOSIS — E119 Type 2 diabetes mellitus without complications: Secondary | ICD-10-CM | POA: Diagnosis not present

## 2016-04-10 DIAGNOSIS — I119 Hypertensive heart disease without heart failure: Secondary | ICD-10-CM | POA: Diagnosis not present

## 2016-04-11 DIAGNOSIS — I519 Heart disease, unspecified: Secondary | ICD-10-CM | POA: Diagnosis not present

## 2016-04-11 DIAGNOSIS — I10 Essential (primary) hypertension: Secondary | ICD-10-CM

## 2016-04-11 DIAGNOSIS — E785 Hyperlipidemia, unspecified: Secondary | ICD-10-CM

## 2016-04-11 DIAGNOSIS — J449 Chronic obstructive pulmonary disease, unspecified: Secondary | ICD-10-CM

## 2016-04-11 DIAGNOSIS — R0602 Shortness of breath: Secondary | ICD-10-CM | POA: Diagnosis not present

## 2016-04-11 DIAGNOSIS — R079 Chest pain, unspecified: Secondary | ICD-10-CM | POA: Diagnosis not present

## 2016-04-11 DIAGNOSIS — E1065 Type 1 diabetes mellitus with hyperglycemia: Secondary | ICD-10-CM | POA: Diagnosis not present

## 2016-04-11 DIAGNOSIS — N183 Chronic kidney disease, stage 3 (moderate): Secondary | ICD-10-CM | POA: Diagnosis not present

## 2016-04-11 DIAGNOSIS — E1042 Type 1 diabetes mellitus with diabetic polyneuropathy: Secondary | ICD-10-CM

## 2016-04-11 DIAGNOSIS — I249 Acute ischemic heart disease, unspecified: Secondary | ICD-10-CM | POA: Diagnosis not present

## 2016-04-11 DIAGNOSIS — R7989 Other specified abnormal findings of blood chemistry: Secondary | ICD-10-CM | POA: Diagnosis not present

## 2016-04-11 DIAGNOSIS — I209 Angina pectoris, unspecified: Secondary | ICD-10-CM | POA: Diagnosis not present

## 2016-04-11 DIAGNOSIS — J9611 Chronic respiratory failure with hypoxia: Secondary | ICD-10-CM

## 2016-04-11 DIAGNOSIS — I482 Chronic atrial fibrillation: Secondary | ICD-10-CM

## 2016-04-12 DIAGNOSIS — R079 Chest pain, unspecified: Secondary | ICD-10-CM | POA: Diagnosis not present

## 2016-04-12 DIAGNOSIS — I5032 Chronic diastolic (congestive) heart failure: Secondary | ICD-10-CM | POA: Diagnosis not present

## 2016-04-12 DIAGNOSIS — E1065 Type 1 diabetes mellitus with hyperglycemia: Secondary | ICD-10-CM | POA: Diagnosis not present

## 2016-04-12 DIAGNOSIS — N183 Chronic kidney disease, stage 3 (moderate): Secondary | ICD-10-CM | POA: Diagnosis not present

## 2016-04-12 DIAGNOSIS — R0789 Other chest pain: Secondary | ICD-10-CM | POA: Diagnosis not present

## 2016-04-12 DIAGNOSIS — J449 Chronic obstructive pulmonary disease, unspecified: Secondary | ICD-10-CM | POA: Diagnosis not present

## 2016-04-12 DIAGNOSIS — N179 Acute kidney failure, unspecified: Secondary | ICD-10-CM | POA: Diagnosis not present

## 2016-04-13 DIAGNOSIS — R0789 Other chest pain: Secondary | ICD-10-CM | POA: Diagnosis not present

## 2016-04-13 DIAGNOSIS — N183 Chronic kidney disease, stage 3 (moderate): Secondary | ICD-10-CM | POA: Diagnosis not present

## 2016-04-13 DIAGNOSIS — R079 Chest pain, unspecified: Secondary | ICD-10-CM | POA: Diagnosis not present

## 2016-04-13 DIAGNOSIS — J449 Chronic obstructive pulmonary disease, unspecified: Secondary | ICD-10-CM | POA: Diagnosis not present

## 2016-04-13 DIAGNOSIS — I361 Nonrheumatic tricuspid (valve) insufficiency: Secondary | ICD-10-CM | POA: Diagnosis not present

## 2016-04-13 DIAGNOSIS — E1065 Type 1 diabetes mellitus with hyperglycemia: Secondary | ICD-10-CM | POA: Diagnosis not present

## 2016-04-13 DIAGNOSIS — I2510 Atherosclerotic heart disease of native coronary artery without angina pectoris: Secondary | ICD-10-CM | POA: Diagnosis not present

## 2016-04-13 DIAGNOSIS — I4891 Unspecified atrial fibrillation: Secondary | ICD-10-CM | POA: Diagnosis not present

## 2016-04-13 DIAGNOSIS — I272 Other secondary pulmonary hypertension: Secondary | ICD-10-CM | POA: Diagnosis not present

## 2016-04-15 DIAGNOSIS — I872 Venous insufficiency (chronic) (peripheral): Secondary | ICD-10-CM | POA: Diagnosis not present

## 2016-04-15 DIAGNOSIS — E119 Type 2 diabetes mellitus without complications: Secondary | ICD-10-CM | POA: Diagnosis not present

## 2016-04-15 DIAGNOSIS — L97222 Non-pressure chronic ulcer of left calf with fat layer exposed: Secondary | ICD-10-CM | POA: Diagnosis not present

## 2016-04-15 DIAGNOSIS — L97821 Non-pressure chronic ulcer of other part of left lower leg limited to breakdown of skin: Secondary | ICD-10-CM | POA: Diagnosis not present

## 2016-04-17 DIAGNOSIS — M25512 Pain in left shoulder: Secondary | ICD-10-CM | POA: Diagnosis not present

## 2016-04-17 DIAGNOSIS — E876 Hypokalemia: Secondary | ICD-10-CM | POA: Diagnosis not present

## 2016-04-17 DIAGNOSIS — I951 Orthostatic hypotension: Secondary | ICD-10-CM | POA: Diagnosis not present

## 2016-04-17 DIAGNOSIS — M19012 Primary osteoarthritis, left shoulder: Secondary | ICD-10-CM | POA: Diagnosis not present

## 2016-04-17 DIAGNOSIS — R0789 Other chest pain: Secondary | ICD-10-CM | POA: Diagnosis not present

## 2016-04-17 DIAGNOSIS — N184 Chronic kidney disease, stage 4 (severe): Secondary | ICD-10-CM | POA: Diagnosis not present

## 2016-04-17 DIAGNOSIS — M5013 Cervical disc disorder with radiculopathy, cervicothoracic region: Secondary | ICD-10-CM | POA: Diagnosis not present

## 2016-04-18 DIAGNOSIS — M7542 Impingement syndrome of left shoulder: Secondary | ICD-10-CM | POA: Diagnosis not present

## 2016-04-22 DIAGNOSIS — I872 Venous insufficiency (chronic) (peripheral): Secondary | ICD-10-CM | POA: Diagnosis not present

## 2016-04-22 DIAGNOSIS — E119 Type 2 diabetes mellitus without complications: Secondary | ICD-10-CM | POA: Diagnosis not present

## 2016-04-22 DIAGNOSIS — L97212 Non-pressure chronic ulcer of right calf with fat layer exposed: Secondary | ICD-10-CM | POA: Diagnosis not present

## 2016-04-22 DIAGNOSIS — I87312 Chronic venous hypertension (idiopathic) with ulcer of left lower extremity: Secondary | ICD-10-CM | POA: Diagnosis not present

## 2016-04-22 DIAGNOSIS — L97821 Non-pressure chronic ulcer of other part of left lower leg limited to breakdown of skin: Secondary | ICD-10-CM | POA: Diagnosis not present

## 2016-04-22 DIAGNOSIS — L97222 Non-pressure chronic ulcer of left calf with fat layer exposed: Secondary | ICD-10-CM | POA: Diagnosis not present

## 2016-04-29 DIAGNOSIS — Z872 Personal history of diseases of the skin and subcutaneous tissue: Secondary | ICD-10-CM | POA: Diagnosis not present

## 2016-04-29 DIAGNOSIS — Z09 Encounter for follow-up examination after completed treatment for conditions other than malignant neoplasm: Secondary | ICD-10-CM | POA: Diagnosis not present

## 2016-05-01 DIAGNOSIS — J018 Other acute sinusitis: Secondary | ICD-10-CM | POA: Diagnosis not present

## 2016-05-01 DIAGNOSIS — M25512 Pain in left shoulder: Secondary | ICD-10-CM | POA: Diagnosis not present

## 2016-05-01 DIAGNOSIS — I951 Orthostatic hypotension: Secondary | ICD-10-CM | POA: Diagnosis not present

## 2016-05-01 DIAGNOSIS — E1121 Type 2 diabetes mellitus with diabetic nephropathy: Secondary | ICD-10-CM | POA: Diagnosis not present

## 2016-05-01 DIAGNOSIS — I119 Hypertensive heart disease without heart failure: Secondary | ICD-10-CM | POA: Diagnosis not present

## 2016-05-01 DIAGNOSIS — R600 Localized edema: Secondary | ICD-10-CM | POA: Diagnosis not present

## 2016-05-07 DIAGNOSIS — E1142 Type 2 diabetes mellitus with diabetic polyneuropathy: Secondary | ICD-10-CM | POA: Diagnosis not present

## 2016-05-07 DIAGNOSIS — I10 Essential (primary) hypertension: Secondary | ICD-10-CM

## 2016-05-07 DIAGNOSIS — E1165 Type 2 diabetes mellitus with hyperglycemia: Secondary | ICD-10-CM | POA: Diagnosis not present

## 2016-05-07 DIAGNOSIS — Z794 Long term (current) use of insulin: Secondary | ICD-10-CM | POA: Diagnosis not present

## 2016-05-07 DIAGNOSIS — I110 Hypertensive heart disease with heart failure: Secondary | ICD-10-CM | POA: Insufficient documentation

## 2016-05-07 HISTORY — DX: Essential (primary) hypertension: I10

## 2016-05-09 DIAGNOSIS — E119 Type 2 diabetes mellitus without complications: Secondary | ICD-10-CM | POA: Diagnosis not present

## 2016-05-09 DIAGNOSIS — Z961 Presence of intraocular lens: Secondary | ICD-10-CM | POA: Diagnosis not present

## 2016-05-09 DIAGNOSIS — H5213 Myopia, bilateral: Secondary | ICD-10-CM | POA: Diagnosis not present

## 2016-05-09 DIAGNOSIS — H401132 Primary open-angle glaucoma, bilateral, moderate stage: Secondary | ICD-10-CM | POA: Diagnosis not present

## 2016-05-27 ENCOUNTER — Ambulatory Visit: Payer: Medicare Other | Admitting: Pulmonary Disease

## 2016-06-03 DIAGNOSIS — L298 Other pruritus: Secondary | ICD-10-CM | POA: Diagnosis not present

## 2016-06-03 DIAGNOSIS — R600 Localized edema: Secondary | ICD-10-CM | POA: Diagnosis not present

## 2016-06-03 DIAGNOSIS — E1121 Type 2 diabetes mellitus with diabetic nephropathy: Secondary | ICD-10-CM | POA: Diagnosis not present

## 2016-06-03 DIAGNOSIS — I119 Hypertensive heart disease without heart failure: Secondary | ICD-10-CM | POA: Diagnosis not present

## 2016-06-03 DIAGNOSIS — L299 Pruritus, unspecified: Secondary | ICD-10-CM | POA: Diagnosis not present

## 2016-06-03 DIAGNOSIS — M25512 Pain in left shoulder: Secondary | ICD-10-CM | POA: Diagnosis not present

## 2016-06-03 DIAGNOSIS — Z23 Encounter for immunization: Secondary | ICD-10-CM | POA: Diagnosis not present

## 2016-06-03 DIAGNOSIS — I951 Orthostatic hypotension: Secondary | ICD-10-CM | POA: Diagnosis not present

## 2016-06-17 DIAGNOSIS — I5032 Chronic diastolic (congestive) heart failure: Secondary | ICD-10-CM | POA: Diagnosis not present

## 2016-06-17 DIAGNOSIS — Z7901 Long term (current) use of anticoagulants: Secondary | ICD-10-CM | POA: Diagnosis not present

## 2016-06-17 DIAGNOSIS — J018 Other acute sinusitis: Secondary | ICD-10-CM | POA: Diagnosis not present

## 2016-06-17 DIAGNOSIS — L97921 Non-pressure chronic ulcer of unspecified part of left lower leg limited to breakdown of skin: Secondary | ICD-10-CM | POA: Diagnosis not present

## 2016-06-17 DIAGNOSIS — H66002 Acute suppurative otitis media without spontaneous rupture of ear drum, left ear: Secondary | ICD-10-CM | POA: Diagnosis not present

## 2016-06-17 DIAGNOSIS — I447 Left bundle-branch block, unspecified: Secondary | ICD-10-CM | POA: Diagnosis not present

## 2016-06-17 DIAGNOSIS — I482 Chronic atrial fibrillation: Secondary | ICD-10-CM | POA: Diagnosis not present

## 2016-06-17 DIAGNOSIS — L97911 Non-pressure chronic ulcer of unspecified part of right lower leg limited to breakdown of skin: Secondary | ICD-10-CM | POA: Diagnosis not present

## 2016-06-17 DIAGNOSIS — I10 Essential (primary) hypertension: Secondary | ICD-10-CM | POA: Diagnosis not present

## 2016-06-17 DIAGNOSIS — J449 Chronic obstructive pulmonary disease, unspecified: Secondary | ICD-10-CM | POA: Diagnosis not present

## 2016-06-19 DIAGNOSIS — I110 Hypertensive heart disease with heart failure: Secondary | ICD-10-CM | POA: Diagnosis not present

## 2016-06-19 DIAGNOSIS — I872 Venous insufficiency (chronic) (peripheral): Secondary | ICD-10-CM | POA: Diagnosis not present

## 2016-06-19 DIAGNOSIS — I509 Heart failure, unspecified: Secondary | ICD-10-CM | POA: Diagnosis not present

## 2016-06-19 DIAGNOSIS — E119 Type 2 diabetes mellitus without complications: Secondary | ICD-10-CM | POA: Diagnosis not present

## 2016-06-19 DIAGNOSIS — L97822 Non-pressure chronic ulcer of other part of left lower leg with fat layer exposed: Secondary | ICD-10-CM | POA: Diagnosis not present

## 2016-06-19 DIAGNOSIS — I2510 Atherosclerotic heart disease of native coronary artery without angina pectoris: Secondary | ICD-10-CM | POA: Diagnosis not present

## 2016-06-19 DIAGNOSIS — L97821 Non-pressure chronic ulcer of other part of left lower leg limited to breakdown of skin: Secondary | ICD-10-CM | POA: Diagnosis not present

## 2016-06-19 DIAGNOSIS — J449 Chronic obstructive pulmonary disease, unspecified: Secondary | ICD-10-CM | POA: Diagnosis not present

## 2016-06-19 DIAGNOSIS — E11622 Type 2 diabetes mellitus with other skin ulcer: Secondary | ICD-10-CM | POA: Diagnosis not present

## 2016-06-19 DIAGNOSIS — I87312 Chronic venous hypertension (idiopathic) with ulcer of left lower extremity: Secondary | ICD-10-CM | POA: Diagnosis not present

## 2016-06-26 DIAGNOSIS — L97822 Non-pressure chronic ulcer of other part of left lower leg with fat layer exposed: Secondary | ICD-10-CM | POA: Diagnosis not present

## 2016-06-26 DIAGNOSIS — I872 Venous insufficiency (chronic) (peripheral): Secondary | ICD-10-CM | POA: Diagnosis not present

## 2016-06-26 DIAGNOSIS — E119 Type 2 diabetes mellitus without complications: Secondary | ICD-10-CM | POA: Diagnosis not present

## 2016-06-26 DIAGNOSIS — E11622 Type 2 diabetes mellitus with other skin ulcer: Secondary | ICD-10-CM | POA: Diagnosis not present

## 2016-06-26 DIAGNOSIS — L97221 Non-pressure chronic ulcer of left calf limited to breakdown of skin: Secondary | ICD-10-CM | POA: Diagnosis not present

## 2016-07-02 DIAGNOSIS — J018 Other acute sinusitis: Secondary | ICD-10-CM | POA: Diagnosis not present

## 2016-07-02 DIAGNOSIS — E1142 Type 2 diabetes mellitus with diabetic polyneuropathy: Secondary | ICD-10-CM | POA: Diagnosis not present

## 2016-07-11 DIAGNOSIS — J018 Other acute sinusitis: Secondary | ICD-10-CM | POA: Diagnosis not present

## 2016-07-21 DIAGNOSIS — H6983 Other specified disorders of Eustachian tube, bilateral: Secondary | ICD-10-CM | POA: Diagnosis not present

## 2016-07-21 DIAGNOSIS — H409 Unspecified glaucoma: Secondary | ICD-10-CM | POA: Diagnosis not present

## 2016-07-21 DIAGNOSIS — J029 Acute pharyngitis, unspecified: Secondary | ICD-10-CM | POA: Diagnosis not present

## 2016-07-21 DIAGNOSIS — H9203 Otalgia, bilateral: Secondary | ICD-10-CM | POA: Diagnosis not present

## 2016-08-04 DIAGNOSIS — M19011 Primary osteoarthritis, right shoulder: Secondary | ICD-10-CM | POA: Diagnosis not present

## 2016-08-06 DIAGNOSIS — M7541 Impingement syndrome of right shoulder: Secondary | ICD-10-CM | POA: Diagnosis not present

## 2016-08-13 DIAGNOSIS — Z125 Encounter for screening for malignant neoplasm of prostate: Secondary | ICD-10-CM | POA: Diagnosis not present

## 2016-08-13 DIAGNOSIS — Z0000 Encounter for general adult medical examination without abnormal findings: Secondary | ICD-10-CM | POA: Diagnosis not present

## 2016-08-13 DIAGNOSIS — Z6841 Body Mass Index (BMI) 40.0 and over, adult: Secondary | ICD-10-CM | POA: Diagnosis not present

## 2016-08-13 DIAGNOSIS — Z1211 Encounter for screening for malignant neoplasm of colon: Secondary | ICD-10-CM | POA: Diagnosis not present

## 2016-08-14 DIAGNOSIS — E1165 Type 2 diabetes mellitus with hyperglycemia: Secondary | ICD-10-CM | POA: Diagnosis not present

## 2016-08-14 DIAGNOSIS — Z794 Long term (current) use of insulin: Secondary | ICD-10-CM | POA: Diagnosis not present

## 2016-08-14 DIAGNOSIS — E1142 Type 2 diabetes mellitus with diabetic polyneuropathy: Secondary | ICD-10-CM | POA: Diagnosis not present

## 2016-08-18 DIAGNOSIS — J029 Acute pharyngitis, unspecified: Secondary | ICD-10-CM | POA: Diagnosis not present

## 2016-08-19 ENCOUNTER — Ambulatory Visit: Payer: Medicare Other | Admitting: Pulmonary Disease

## 2016-08-19 DIAGNOSIS — J690 Pneumonitis due to inhalation of food and vomit: Secondary | ICD-10-CM | POA: Diagnosis present

## 2016-08-19 DIAGNOSIS — I130 Hypertensive heart and chronic kidney disease with heart failure and stage 1 through stage 4 chronic kidney disease, or unspecified chronic kidney disease: Secondary | ICD-10-CM | POA: Diagnosis present

## 2016-08-19 DIAGNOSIS — J449 Chronic obstructive pulmonary disease, unspecified: Secondary | ICD-10-CM | POA: Diagnosis not present

## 2016-08-19 DIAGNOSIS — I252 Old myocardial infarction: Secondary | ICD-10-CM | POA: Diagnosis not present

## 2016-08-19 DIAGNOSIS — Z8673 Personal history of transient ischemic attack (TIA), and cerebral infarction without residual deficits: Secondary | ICD-10-CM | POA: Diagnosis not present

## 2016-08-19 DIAGNOSIS — Z79891 Long term (current) use of opiate analgesic: Secondary | ICD-10-CM | POA: Diagnosis not present

## 2016-08-19 DIAGNOSIS — R0602 Shortness of breath: Secondary | ICD-10-CM | POA: Diagnosis not present

## 2016-08-19 DIAGNOSIS — I5033 Acute on chronic diastolic (congestive) heart failure: Secondary | ICD-10-CM | POA: Diagnosis present

## 2016-08-19 DIAGNOSIS — J9621 Acute and chronic respiratory failure with hypoxia: Secondary | ICD-10-CM | POA: Diagnosis not present

## 2016-08-19 DIAGNOSIS — G4733 Obstructive sleep apnea (adult) (pediatric): Secondary | ICD-10-CM | POA: Diagnosis not present

## 2016-08-19 DIAGNOSIS — Z7901 Long term (current) use of anticoagulants: Secondary | ICD-10-CM

## 2016-08-19 DIAGNOSIS — Z7952 Long term (current) use of systemic steroids: Secondary | ICD-10-CM | POA: Diagnosis not present

## 2016-08-19 DIAGNOSIS — I10 Essential (primary) hypertension: Secondary | ICD-10-CM

## 2016-08-19 DIAGNOSIS — G2581 Restless legs syndrome: Secondary | ICD-10-CM | POA: Diagnosis present

## 2016-08-19 DIAGNOSIS — J181 Lobar pneumonia, unspecified organism: Secondary | ICD-10-CM | POA: Diagnosis not present

## 2016-08-19 DIAGNOSIS — Z452 Encounter for adjustment and management of vascular access device: Secondary | ICD-10-CM | POA: Diagnosis not present

## 2016-08-19 DIAGNOSIS — M1990 Unspecified osteoarthritis, unspecified site: Secondary | ICD-10-CM | POA: Diagnosis present

## 2016-08-19 DIAGNOSIS — E785 Hyperlipidemia, unspecified: Secondary | ICD-10-CM | POA: Diagnosis not present

## 2016-08-19 DIAGNOSIS — E039 Hypothyroidism, unspecified: Secondary | ICD-10-CM | POA: Diagnosis present

## 2016-08-19 DIAGNOSIS — I5030 Unspecified diastolic (congestive) heart failure: Secondary | ICD-10-CM | POA: Diagnosis not present

## 2016-08-19 DIAGNOSIS — J441 Chronic obstructive pulmonary disease with (acute) exacerbation: Secondary | ICD-10-CM | POA: Diagnosis not present

## 2016-08-19 DIAGNOSIS — E1142 Type 2 diabetes mellitus with diabetic polyneuropathy: Secondary | ICD-10-CM | POA: Diagnosis present

## 2016-08-19 DIAGNOSIS — Z6841 Body Mass Index (BMI) 40.0 and over, adult: Secondary | ICD-10-CM | POA: Diagnosis not present

## 2016-08-19 DIAGNOSIS — Z794 Long term (current) use of insulin: Secondary | ICD-10-CM | POA: Diagnosis not present

## 2016-08-19 DIAGNOSIS — I313 Pericardial effusion (noninflammatory): Secondary | ICD-10-CM | POA: Diagnosis present

## 2016-08-19 DIAGNOSIS — J9601 Acute respiratory failure with hypoxia: Secondary | ICD-10-CM | POA: Diagnosis not present

## 2016-08-19 DIAGNOSIS — E1122 Type 2 diabetes mellitus with diabetic chronic kidney disease: Secondary | ICD-10-CM | POA: Diagnosis present

## 2016-08-19 DIAGNOSIS — I482 Chronic atrial fibrillation: Secondary | ICD-10-CM | POA: Diagnosis not present

## 2016-08-19 DIAGNOSIS — G8929 Other chronic pain: Secondary | ICD-10-CM | POA: Diagnosis present

## 2016-08-19 DIAGNOSIS — E1042 Type 1 diabetes mellitus with diabetic polyneuropathy: Secondary | ICD-10-CM | POA: Diagnosis not present

## 2016-08-19 DIAGNOSIS — I5021 Acute systolic (congestive) heart failure: Secondary | ICD-10-CM | POA: Diagnosis not present

## 2016-08-19 DIAGNOSIS — I110 Hypertensive heart disease with heart failure: Secondary | ICD-10-CM | POA: Diagnosis not present

## 2016-08-19 DIAGNOSIS — F17211 Nicotine dependence, cigarettes, in remission: Secondary | ICD-10-CM | POA: Diagnosis not present

## 2016-08-19 DIAGNOSIS — E1165 Type 2 diabetes mellitus with hyperglycemia: Secondary | ICD-10-CM | POA: Diagnosis not present

## 2016-08-19 DIAGNOSIS — I4891 Unspecified atrial fibrillation: Secondary | ICD-10-CM | POA: Diagnosis not present

## 2016-08-19 DIAGNOSIS — R1310 Dysphagia, unspecified: Secondary | ICD-10-CM | POA: Diagnosis present

## 2016-08-19 DIAGNOSIS — I361 Nonrheumatic tricuspid (valve) insufficiency: Secondary | ICD-10-CM | POA: Diagnosis not present

## 2016-08-20 ENCOUNTER — Ambulatory Visit: Payer: Medicare Other | Admitting: Pulmonary Disease

## 2016-09-02 DIAGNOSIS — J018 Other acute sinusitis: Secondary | ICD-10-CM | POA: Diagnosis not present

## 2016-09-02 DIAGNOSIS — E1142 Type 2 diabetes mellitus with diabetic polyneuropathy: Secondary | ICD-10-CM | POA: Diagnosis not present

## 2016-09-02 DIAGNOSIS — Z79899 Other long term (current) drug therapy: Secondary | ICD-10-CM | POA: Diagnosis not present

## 2016-09-02 DIAGNOSIS — J9601 Acute respiratory failure with hypoxia: Secondary | ICD-10-CM | POA: Diagnosis not present

## 2016-09-02 DIAGNOSIS — I5032 Chronic diastolic (congestive) heart failure: Secondary | ICD-10-CM | POA: Diagnosis not present

## 2016-09-02 DIAGNOSIS — J158 Pneumonia due to other specified bacteria: Secondary | ICD-10-CM | POA: Diagnosis not present

## 2016-09-08 ENCOUNTER — Ambulatory Visit (INDEPENDENT_AMBULATORY_CARE_PROVIDER_SITE_OTHER): Payer: Medicare Other | Admitting: Acute Care

## 2016-09-08 ENCOUNTER — Encounter: Payer: Self-pay | Admitting: Acute Care

## 2016-09-08 DIAGNOSIS — I5032 Chronic diastolic (congestive) heart failure: Secondary | ICD-10-CM

## 2016-09-08 DIAGNOSIS — J9611 Chronic respiratory failure with hypoxia: Secondary | ICD-10-CM

## 2016-09-08 DIAGNOSIS — G4733 Obstructive sleep apnea (adult) (pediatric): Secondary | ICD-10-CM | POA: Diagnosis not present

## 2016-09-08 MED ORDER — DOXYCYCLINE HYCLATE 100 MG PO TABS: 100.0000 mg | ORAL_TABLET | Freq: Two times a day (BID) | ORAL | 0 refills | Status: DC

## 2016-09-08 NOTE — Assessment & Plan Note (Signed)
Continue 40 mg torsemide twice daily, and 2.5 mg of Metolozone M/W/F  Labs per cardiology 09/16/2016. Low salt diet Weigh daily

## 2016-09-08 NOTE — Assessment & Plan Note (Addendum)
Recent admission for Acute on chronic Respiratory failure with multifactorial etiology including CAP, CHF exacerbation/ COPD exacerbation >> Resolving  Plan: Start Mucinex 1200 mg daily with a full glass of water for chest congestion. Delsym for cough 1 teaspoon every 12 hours as needed. Doxycycline 100 mg BID x 7 days. Follow up appointment in 6 weeks with Dr. Halford Chessman or Eric Form, NP with CXR prior to appointment. Follow up with your cardiologist 09/16/2016 as is already scheduled with labs. Please contact office for sooner follow up if symptoms do not improve or worsen or seek emergency care

## 2016-09-08 NOTE — Progress Notes (Signed)
History of Present Illness Jon [name] is a 66 y.o. male with COPD, OSA, and known diastolic heart failure followed  by Dr. Halford Chessman.   09/08/2016 Hospital Follow Up: Pt. Presents for hospital follow after admission to [facility] 12/19-12/23/2017 for . Pt thinks these are the dates, but is unsure as he has not brought any paperwork with him and is a poor historian. I have requested that his discharge summary be faxed from [facility], as I have no documentation of treatment or follow up requests as there is no Epic interface with [facility]. .He presents today stating he feels bad. He states he does not have fever, but has a dull cough he cannot get rid of. He chest congestion which he cannot cough up .He states that when he does cough, the secretions are yellow. He is not taking anything for cough.He is on chronic prednisone at 10 mg daily.He is compliant with his Advair and Albuterol as needed for his COPD. He is unsure if he is using CPAP or BiPAP at night post discharge.He is compliant with the oxygen at 2 L with his device. Due to insurance changes, he is having to change his ICS/ LABA with the new year., but he does not know what he is changing to. He denies fever, chest pain, orthopnea or hemoptysis. He is currently using his rescue inhaler twice daily. His oxygen saturation in the office today on RA was 96%. I will document specifics of admission once  discharge summary  fax arrives from [facility].  Synopsis of 08/19/2016-08/23/2016 Admission Pt. Presented to [facility] on 08/19/2016 with progressive fever ( T Max 103), and shortness of breath. CXR in the ED revealed a right upper lobe opacity concerning for pneumonia.Additionally he was markedly edematous, wheezy and hypoxemic.He was transferred to the ICU and treated with BiPAP, IV Levaquin, IV Solumedrol and a Lasix drip.  He was diuresed over the next several days for a total fluid loss of approximately 5 L.  He was weaned off BiPAP. Cardiology was consulted for assistance of management of CHF.The patient was weaned from BiPAP , and discharged home on 40 mg torsemide twice daily, and 2.5 mg of Metolozone M/W/F on 08/23/2016. Home health services were arranged through case management so that patient's heart failure could be better managed, and so that issues of deconditioning could be evaluated.   Tests CXR 08/19/2016: Impression: New Right Upper Lobe airspace opacity concerning for pneumonia.Recommend follow up CXR in 3-4 weeks after antibiotic therapy to ensure resolution, and rule out underlying neoplasm.  Echo 08/21/2016 Limited echo which was technically difficult with sub-optimal views EF 60-65% RV Moderately enlarged LA Moderately dilated Mild Tricuspid Regurg is noted. RV systolic pressure as measured by Doppler 37 mm Hg. Echo Free space my represent effusion or fat pad. Pulmonic valve was not well visualized.  There is no BNP documented in the discharge summary or available for view in Epic.Marland Kitchen   Past medical hx Past Medical History:  Diagnosis Date  . Angina   . Arthritis    "knees" (01/18/2014)  . Asthma   . Atrial fibrillation ([place])   . Cough    coughing up blood- started last nite, seems to be worse now  . Dysrhythmia    atrial fib takes cardizem,   . Fibromyalgia   . GERD (gastroesophageal reflux disease)   . H/O hiatal hernia   . Hyperlipemia   . Hypertension   . Hypothyroidism   . IDDM (insulin dependent  diabetes mellitus) (HCC)    insulin pump   . Myocardial infarction    "one dr says yes; another says no"  . Neuromuscular disorder (HCC)    rls, neuropathy  . Obesity (BMI 30-39.9)   . On home oxygen therapy    "2L w/CPAP at bedtime" (01/18/2014)  . OSA on CPAP    cpap & oxygen  . Pericardial effusion   . Pneumonia 5/15   "several times" (01/18/2014)  . Restless leg syndrome   . Stroke [facility]) 2012   denies residual on 01/18/2014     Past surgical hx, Family  hx, Social hx all reviewed.  Current Outpatient Prescriptions on File Prior to Visit  Medication Sig  . albuterol (PROVENTIL HFA;VENTOLIN HFA) 108 (90 BASE) MCG/ACT inhaler Inhale 2 puffs into the lungs every 6 (six) hours as needed for wheezing or shortness of breath.  Marland Kitchen albuterol (PROVENTIL) (2.5 MG/3ML) 0.083% nebulizer solution Take 3 mLs (2.5 mg total) by nebulization 2 (two) times daily. And as needed  . atorvastatin (LIPITOR) 40 MG tablet Take 40 mg by mouth daily.  Marland Kitchen azelastine (ASTELIN) 137 MCG/SPRAY nasal spray Place 1 spray into both nostrils at bedtime as needed for allergies.   Marland Kitchen diltiazem (CARDIZEM) 30 MG tablet Take 30 mg by mouth 2 (two) times daily.  . famotidine (PEPCID) 20 MG tablet TAKE 1 TABLET BY MOUTH AT BEDTIME  . fluticasone (FLONASE) 50 MCG/ACT nasal spray Place 1 spray into both nostrils daily as needed for allergies.   . Fluticasone-Salmeterol (ADVAIR) 500-50 MCG/DOSE AEPB Inhale 1 puff into the lungs every 12 (twelve) hours.  . Insulin Human (INSULIN PUMP) SOLN Inject 1 each into the skin continuous. Inject 0.8 units of novolog every hour per insulin pump.  Dose Increased due to chronic steroid use  . isosorbide mononitrate (IMDUR) 60 MG 24 hr tablet Take 60 mg by mouth every morning.  . latanoprost (XALATAN) 0.005 % ophthalmic solution Place 1 drop into both eyes at bedtime.  Marland Kitchen MOVANTIK 25 MG TABS Take 25 mg by mouth daily.   . Multiple Vitamins-Minerals (CENTRUM SILVER PO) Take 1 tablet by mouth daily.  . nitroGLYCERIN (NITROSTAT) 0.4 MG SL tablet Place 0.4 mg under the tongue every 5 (five) minutes as needed. For chest pain  . OXYCONTIN 20 MG 12 hr tablet Take 20 mg by mouth every 12 (twelve) hours.   . pantoprazole (PROTONIX) 40 MG tablet Take 40 mg by mouth 2 (two) times daily.  Vladimir Faster Glycol-Propyl Glycol (SYSTANE PRESERVATIVE FREE) 0.4-0.3 % SOLN Apply 1 drop to eye 4 (four) times daily as needed (dryness of eye).   . predniSONE (DELTASONE) 10 MG tablet  1.5 pills daiy for 2 weeks, then 1 pill daily  . pregabalin (LYRICA) 75 MG capsule Take 1 capsule (75 mg total) by mouth 2 (two) times daily.  . Rivaroxaban (XARELTO) 15 MG TABS tablet Take 15 mg by mouth daily.  Marland Kitchen torsemide (DEMADEX) 20 MG tablet Take 20 mg by mouth 2 (two) times daily.   No current facility-administered medications on file prior to visit.      Allergies  Allergen Reactions  . Codeine Itching  . Penicillins Rash    Review Of Systems:  Constitutional:   No  weight loss, night sweats,  Fevers, chills, fatigue, or  lassitude.  HEENT:   No headaches,  Difficulty swallowing,  Tooth/dental problems, or  Sore throat,                No sneezing,  itching, ear ache, nasal congestion, post nasal drip,   CV:  No chest pain,  Orthopnea, PND, swelling in lower extremities, anasarca, dizziness, palpitations, syncope.   GI  No heartburn, indigestion, abdominal pain, nausea, vomiting, diarrhea, change in bowel habits, loss of appetite, bloody stools.   Resp: + shortness of breath with exertion less at rest.  No  excess mucus, + productive cough,  + non-productive cough,  No coughing up of blood.  + change in color of mucus.  + wheezing.  No chest wall deformity  Skin: no rash or lesions.  GU: no dysuria, change in color of urine, no urgency or frequency.  No flank pain, no hematuria   MS:  No joint pain or swelling.  No decreased range of motion.  No back pain.  Psych:  No change in mood or affect. No depression or anxiety.  No memory loss.   Vital Signs BP (!) 138/92 (BP Location: Left Arm, Cuff Size: Normal)   Pulse 87   Ht 5\' 7"  (1.702 m)   Wt 274 lb 6.4 oz (124.5 kg)   SpO2 96%   BMI 42.98 kg/m   Body mass index is 42.98 kg/m. Physical Exam:  General- No distress,  A&Ox3, morbidly obese, deconditioned male ENT: No sinus tenderness, TM clear, pale nasal mucosa, no oral exudate,no post nasal drip, no LAN Cardiac: S1, S2, regular rate and rhythm, no murmur Chest:  No wheeze/ rales/ dullness; no accessory muscle use, no nasal flaring, no sternal retractions,diminished per bases bilaterally. Abd.: Soft Non-tender, obese Ext: No clubbing cyanosis, trace edema ( 1+) Neuro:  normal strength, MAE x 4 Skin: No rashes, warm and dry Psych: normal mood and behavior,   Poor historian.   Assessment/Plan  Chronic respiratory failure Recent admission for Acute on chronic Respiratory failure with multifactorial etiology including CAP, CHF exacerbation/ COPD exacerbation >> Resolving  Plan: Start Mucinex 1200 mg daily with a full glass of water for chest congestion. Delsym for cough 1 teaspoon every 12 hours as needed. Doxycycline 100 mg BID x 7 days. Follow up appointment in 6 weeks with Dr. Halford Chessman or Eric Form, NP with CXR prior to appointment. Follow up with your cardiologist 09/16/2016 as is already scheduled with labs. Please contact office for sooner follow up if symptoms do not improve or worsen or seek emergency care     Obstructive apnea Recent admission for CAP/ CHF exacerbation/ COPD exacerbation Treated with BiPap on admission Plan: Continue on CPAP/ BiPap ( Pt is unsure of discharge therapy )at bedtime with oxygen at 2 L [place].  You appear to be benefiting from the treatment Goal is to wear for at least 6 hours each night for maximal clinical benefit. Continue to work on weight loss, as the link between excess weight  and sleep apnea is well established.  Do not drive if sleepy. Follow up with Dr. Halford Chessman  In 6 weeks or before as needed.  We will call [employer] to see if patient was transitioned to BiPAP, and if he is using the Trilogy device per the discharging MD request. Please contact office for sooner follow up if symptoms do not improve or worsen or seek emergency care    Chronic diastolic heart failure Continue 40 mg torsemide twice daily, and 2.5 mg of Metolozone M/W/F  Labs per cardiology 09/16/2016. Low salt diet Weigh  daily    Magdalen Spatz, NP 09/08/2016  7:54 PM

## 2016-09-08 NOTE — Assessment & Plan Note (Addendum)
Recent admission for CAP/ CHF exacerbation/ COPD exacerbation Treated with BiPap on admission Plan: Continue on CPAP/ BiPap ( Pt is unsure of discharge therapy )at bedtime with oxygen at 2 L [place].  You appear to be benefiting from the treatment Goal is to wear for at least 6 hours each night for maximal clinical benefit. Continue to work on weight loss, as the link between excess weight  and sleep apnea is well established.  Do not drive if sleepy. Follow up with Dr. Halford Chessman  In 6 weeks or before as needed.  We will call [employer] to see if patient was transitioned to BiPAP, and if he is using the Trilogy device per the discharging MD request. Please contact office for sooner follow up if symptoms do not improve or worsen or seek emergency care

## 2016-09-08 NOTE — Patient Instructions (Addendum)
It is nice to meet you today. Start Mucinex 1200 mg daily with a full glass of water for chest congestion. Delsym for cough 1 teaspoon every 12 hours as needed. Doxycycline 100 mg BID x 7 days. Follow up appointment in 6 weeks with Dr. Halford Chessman or Eric Form, NP with CXR prior to appointment. Follow up with your cardiologist 09/16/2016 as is already scheduled.. Please contact office for sooner follow up if symptoms do not improve or worsen or seek emergency care   .

## 2016-09-09 NOTE — Progress Notes (Signed)
I have reviewed and agree with assessment/plan.  [name], MD [facility]monary/Critical Care 09/09/2016, 10:00 AM Pager:  [phone]

## 2016-09-15 ENCOUNTER — Telehealth: Payer: Self-pay | Admitting: *Deleted

## 2016-09-15 NOTE — Telephone Encounter (Signed)
-----   Message from Magdalen Spatz, NP sent at 09/08/2016  9:20 PM EST ----- Regarding: Determining home device for OSA Deshondra Worst, Please check with [facility] and see if Mr. Kelly was sent home or set up for a trilogy BiPap for his OSA after his most recent discharge. He was unsure if he is on CPAP or BIPAP at home.Thanks so much! Judson Roch

## 2016-09-16 DIAGNOSIS — Z7901 Long term (current) use of anticoagulants: Secondary | ICD-10-CM | POA: Diagnosis not present

## 2016-09-16 DIAGNOSIS — J449 Chronic obstructive pulmonary disease, unspecified: Secondary | ICD-10-CM | POA: Diagnosis not present

## 2016-09-16 DIAGNOSIS — I5032 Chronic diastolic (congestive) heart failure: Secondary | ICD-10-CM | POA: Diagnosis not present

## 2016-09-16 DIAGNOSIS — I482 Chronic atrial fibrillation: Secondary | ICD-10-CM | POA: Diagnosis not present

## 2016-09-19 ENCOUNTER — Telehealth: Payer: Self-pay | Admitting: Pulmonary Disease

## 2016-09-19 DIAGNOSIS — J9611 Chronic respiratory failure with hypoxia: Secondary | ICD-10-CM

## 2016-09-19 NOTE — Telephone Encounter (Signed)
Attempted to call pt. Line was answered and disconnected. Tried to call pt back but received a fast busy signal. Will try back.

## 2016-09-22 ENCOUNTER — Telehealth: Payer: Self-pay | Admitting: Pulmonary Disease

## 2016-09-22 NOTE — Telephone Encounter (Signed)
Called [employer], LM for Estill Bamberg to return call 09/23/16

## 2016-09-22 NOTE — Telephone Encounter (Signed)
Spoke with pt. He needs an order to go to [facility] for small portable oxygen tanks. Pt is currently using 2L. Order has been placed. Nothing further was needed.

## 2016-09-22 NOTE — Telephone Encounter (Signed)
Pt states that he currently has CPAP machine and does not have a BIPAP machine. He was not sent home with a new machine or order for Trilogy BiPAP. Sarah please advise if you wish to order this. Pt states that his current machine is very old and he is okay with getting something updated.

## 2016-09-23 DIAGNOSIS — I110 Hypertensive heart disease with heart failure: Secondary | ICD-10-CM | POA: Diagnosis not present

## 2016-09-23 DIAGNOSIS — I5032 Chronic diastolic (congestive) heart failure: Secondary | ICD-10-CM | POA: Diagnosis not present

## 2016-09-23 DIAGNOSIS — J449 Chronic obstructive pulmonary disease, unspecified: Secondary | ICD-10-CM | POA: Diagnosis not present

## 2016-09-23 DIAGNOSIS — Z7901 Long term (current) use of anticoagulants: Secondary | ICD-10-CM | POA: Diagnosis not present

## 2016-09-23 DIAGNOSIS — I482 Chronic atrial fibrillation: Secondary | ICD-10-CM | POA: Diagnosis not present

## 2016-09-23 NOTE — Telephone Encounter (Signed)
Please call the patient and let him know that we will talk with Dr. Halford Chessman. Most likely we will need to set up a CPAP titration before insurance will approve a change from CPAP to BiPAP or Trilogy. Let him know the office will be in touch.

## 2016-09-23 NOTE — Telephone Encounter (Signed)
Spoke with Estill Bamberg at [place]. She states that since pt's oxygen is ordered only as needed, pt will need to be tested in order to receive the portable tanks we ordered. lmtcb x1 for pt to schedule him for a qualifying walk.

## 2016-09-23 NOTE — Telephone Encounter (Signed)
Jon [name], [phone].  Returning call.

## 2016-09-24 NOTE — Telephone Encounter (Signed)
Attempted to contact pt. No answer, no option to leave a message. Will try back.  

## 2016-09-24 NOTE — Telephone Encounter (Signed)
ATC x 1, no voicemail. WCB

## 2016-09-24 NOTE — Telephone Encounter (Signed)
Per Daneil Dan I have scheduled the walk for the patient and he is aware

## 2016-09-25 ENCOUNTER — Telehealth: Payer: Self-pay | Admitting: Acute Care

## 2016-09-25 ENCOUNTER — Ambulatory Visit (INDEPENDENT_AMBULATORY_CARE_PROVIDER_SITE_OTHER): Payer: Medicare Other | Admitting: Pulmonary Disease

## 2016-09-25 DIAGNOSIS — J9611 Chronic respiratory failure with hypoxia: Secondary | ICD-10-CM | POA: Diagnosis not present

## 2016-09-25 DIAGNOSIS — G4730 Sleep apnea, unspecified: Secondary | ICD-10-CM

## 2016-09-25 NOTE — Telephone Encounter (Signed)
Attempted to call patient. No voicemail. Will attempt to call back later.

## 2016-09-25 NOTE — Addendum Note (Signed)
Addended by: Desmond Dike C on: 09/25/2016 03:44 PM   Modules accepted: Orders

## 2016-09-25 NOTE — Telephone Encounter (Signed)
Attempted to contact pt. No answer, no option to leave a message. Will try back.  

## 2016-09-25 NOTE — Telephone Encounter (Signed)
Diagnostic in lab sleep study. Not a split night. I spoke with Dr. Halford Chessman and he felt this was the bast plan. He may need additional titration study once we have a diagnostic sleep study. Thanks so much.

## 2016-09-25 NOTE — Telephone Encounter (Signed)
Please call patient and let him know we need to have a Diagnostic sleep study done to evaluate his need for CPAP machine vs. BiBap Machine. We do not have a sleep study on file.Explain that there is a process we need to follow to confirm  documentation of his diagnosis  Please order the Diagnostic Sleep Study, in lab. Please remind him to use his current CPAP machine each night while we are evaluating need for change of device. Thanks so much.

## 2016-09-25 NOTE — Telephone Encounter (Signed)
Pt came in today for a qualifying walk for oxygen needs. He is aware of SG's original message. Pt is fine with doing this sleep study.  SG - can you please specify what type of sleep study you would like ordered. Thank you.

## 2016-09-26 NOTE — Telephone Encounter (Signed)
Spoke with patient and informed him of SG message. Pt stated someone had contacted him this morning and he will have a CPAP titration done on November 10 2016 will at 8 am. Pt had no further questions. Nothing else needed.

## 2016-10-21 ENCOUNTER — Ambulatory Visit: Payer: Medicare Other | Admitting: Pulmonary Disease

## 2016-10-28 DIAGNOSIS — I482 Chronic atrial fibrillation: Secondary | ICD-10-CM | POA: Diagnosis not present

## 2016-10-28 DIAGNOSIS — Z7901 Long term (current) use of anticoagulants: Secondary | ICD-10-CM | POA: Diagnosis not present

## 2016-10-28 DIAGNOSIS — I5032 Chronic diastolic (congestive) heart failure: Secondary | ICD-10-CM | POA: Diagnosis not present

## 2016-10-28 DIAGNOSIS — I110 Hypertensive heart disease with heart failure: Secondary | ICD-10-CM | POA: Diagnosis not present

## 2016-11-10 ENCOUNTER — Encounter (HOSPITAL_BASED_OUTPATIENT_CLINIC_OR_DEPARTMENT_OTHER): Payer: Medicare Other

## 2016-11-11 DIAGNOSIS — I69922 Dysarthria following unspecified cerebrovascular disease: Secondary | ICD-10-CM | POA: Diagnosis not present

## 2016-11-11 DIAGNOSIS — R0602 Shortness of breath: Secondary | ICD-10-CM | POA: Diagnosis not present

## 2016-11-11 DIAGNOSIS — I252 Old myocardial infarction: Secondary | ICD-10-CM | POA: Diagnosis not present

## 2016-11-11 DIAGNOSIS — E785 Hyperlipidemia, unspecified: Secondary | ICD-10-CM | POA: Diagnosis present

## 2016-11-11 DIAGNOSIS — E784 Other hyperlipidemia: Secondary | ICD-10-CM | POA: Diagnosis not present

## 2016-11-11 DIAGNOSIS — N183 Chronic kidney disease, stage 3 (moderate): Secondary | ICD-10-CM | POA: Diagnosis not present

## 2016-11-11 DIAGNOSIS — J9611 Chronic respiratory failure with hypoxia: Secondary | ICD-10-CM | POA: Diagnosis not present

## 2016-11-11 DIAGNOSIS — D72829 Elevated white blood cell count, unspecified: Secondary | ICD-10-CM | POA: Diagnosis not present

## 2016-11-11 DIAGNOSIS — J449 Chronic obstructive pulmonary disease, unspecified: Secondary | ICD-10-CM | POA: Diagnosis not present

## 2016-11-11 DIAGNOSIS — Z6841 Body Mass Index (BMI) 40.0 and over, adult: Secondary | ICD-10-CM | POA: Diagnosis not present

## 2016-11-11 DIAGNOSIS — M1990 Unspecified osteoarthritis, unspecified site: Secondary | ICD-10-CM | POA: Diagnosis present

## 2016-11-11 DIAGNOSIS — Z8711 Personal history of peptic ulcer disease: Secondary | ICD-10-CM | POA: Diagnosis not present

## 2016-11-11 DIAGNOSIS — G8929 Other chronic pain: Secondary | ICD-10-CM | POA: Diagnosis present

## 2016-11-11 DIAGNOSIS — I482 Chronic atrial fibrillation: Secondary | ICD-10-CM | POA: Diagnosis not present

## 2016-11-11 DIAGNOSIS — R0789 Other chest pain: Secondary | ICD-10-CM | POA: Diagnosis not present

## 2016-11-11 DIAGNOSIS — I5032 Chronic diastolic (congestive) heart failure: Secondary | ICD-10-CM | POA: Diagnosis present

## 2016-11-11 DIAGNOSIS — I69954 Hemiplegia and hemiparesis following unspecified cerebrovascular disease affecting left non-dominant side: Secondary | ICD-10-CM | POA: Diagnosis not present

## 2016-11-11 DIAGNOSIS — R079 Chest pain, unspecified: Secondary | ICD-10-CM | POA: Diagnosis not present

## 2016-11-11 DIAGNOSIS — Z7901 Long term (current) use of anticoagulants: Secondary | ICD-10-CM | POA: Diagnosis not present

## 2016-11-11 DIAGNOSIS — S0990XA Unspecified injury of head, initial encounter: Secondary | ICD-10-CM | POA: Diagnosis not present

## 2016-11-11 DIAGNOSIS — I130 Hypertensive heart and chronic kidney disease with heart failure and stage 1 through stage 4 chronic kidney disease, or unspecified chronic kidney disease: Secondary | ICD-10-CM | POA: Diagnosis present

## 2016-11-11 DIAGNOSIS — Z794 Long term (current) use of insulin: Secondary | ICD-10-CM | POA: Diagnosis not present

## 2016-11-11 DIAGNOSIS — R531 Weakness: Secondary | ICD-10-CM | POA: Diagnosis not present

## 2016-11-11 DIAGNOSIS — I5030 Unspecified diastolic (congestive) heart failure: Secondary | ICD-10-CM | POA: Diagnosis not present

## 2016-11-11 DIAGNOSIS — G4733 Obstructive sleep apnea (adult) (pediatric): Secondary | ICD-10-CM | POA: Diagnosis present

## 2016-11-11 DIAGNOSIS — E1040 Type 1 diabetes mellitus with diabetic neuropathy, unspecified: Secondary | ICD-10-CM | POA: Diagnosis present

## 2016-11-11 DIAGNOSIS — I4891 Unspecified atrial fibrillation: Secondary | ICD-10-CM | POA: Diagnosis not present

## 2016-11-11 DIAGNOSIS — E1159 Type 2 diabetes mellitus with other circulatory complications: Secondary | ICD-10-CM | POA: Diagnosis not present

## 2016-11-11 DIAGNOSIS — F418 Other specified anxiety disorders: Secondary | ICD-10-CM | POA: Diagnosis present

## 2016-11-11 DIAGNOSIS — E1022 Type 1 diabetes mellitus with diabetic chronic kidney disease: Secondary | ICD-10-CM | POA: Diagnosis present

## 2016-11-11 DIAGNOSIS — N179 Acute kidney failure, unspecified: Secondary | ICD-10-CM | POA: Diagnosis present

## 2016-11-11 DIAGNOSIS — E1065 Type 1 diabetes mellitus with hyperglycemia: Secondary | ICD-10-CM | POA: Diagnosis present

## 2016-11-11 DIAGNOSIS — E876 Hypokalemia: Secondary | ICD-10-CM | POA: Diagnosis not present

## 2016-11-11 DIAGNOSIS — Z9981 Dependence on supplemental oxygen: Secondary | ICD-10-CM | POA: Diagnosis not present

## 2016-11-11 DIAGNOSIS — F17211 Nicotine dependence, cigarettes, in remission: Secondary | ICD-10-CM | POA: Diagnosis not present

## 2016-11-11 DIAGNOSIS — K219 Gastro-esophageal reflux disease without esophagitis: Secondary | ICD-10-CM | POA: Diagnosis present

## 2016-11-12 DIAGNOSIS — R079 Chest pain, unspecified: Secondary | ICD-10-CM | POA: Diagnosis not present

## 2016-11-16 DIAGNOSIS — Z6841 Body Mass Index (BMI) 40.0 and over, adult: Secondary | ICD-10-CM | POA: Diagnosis not present

## 2016-11-16 DIAGNOSIS — Z9981 Dependence on supplemental oxygen: Secondary | ICD-10-CM | POA: Diagnosis not present

## 2016-11-16 DIAGNOSIS — I130 Hypertensive heart and chronic kidney disease with heart failure and stage 1 through stage 4 chronic kidney disease, or unspecified chronic kidney disease: Secondary | ICD-10-CM | POA: Diagnosis not present

## 2016-11-16 DIAGNOSIS — E1022 Type 1 diabetes mellitus with diabetic chronic kidney disease: Secondary | ICD-10-CM | POA: Diagnosis not present

## 2016-11-16 DIAGNOSIS — G4733 Obstructive sleep apnea (adult) (pediatric): Secondary | ICD-10-CM | POA: Diagnosis not present

## 2016-11-16 DIAGNOSIS — I5030 Unspecified diastolic (congestive) heart failure: Secondary | ICD-10-CM | POA: Diagnosis not present

## 2016-11-16 DIAGNOSIS — J449 Chronic obstructive pulmonary disease, unspecified: Secondary | ICD-10-CM | POA: Diagnosis not present

## 2016-11-16 DIAGNOSIS — I482 Chronic atrial fibrillation: Secondary | ICD-10-CM | POA: Diagnosis not present

## 2016-11-16 DIAGNOSIS — Z7901 Long term (current) use of anticoagulants: Secondary | ICD-10-CM | POA: Diagnosis not present

## 2016-11-16 DIAGNOSIS — Z9181 History of falling: Secondary | ICD-10-CM | POA: Diagnosis not present

## 2016-11-16 DIAGNOSIS — Z7952 Long term (current) use of systemic steroids: Secondary | ICD-10-CM | POA: Diagnosis not present

## 2016-11-16 DIAGNOSIS — E1042 Type 1 diabetes mellitus with diabetic polyneuropathy: Secondary | ICD-10-CM | POA: Diagnosis not present

## 2016-11-16 DIAGNOSIS — I69322 Dysarthria following cerebral infarction: Secondary | ICD-10-CM | POA: Diagnosis not present

## 2016-11-16 DIAGNOSIS — I2510 Atherosclerotic heart disease of native coronary artery without angina pectoris: Secondary | ICD-10-CM | POA: Diagnosis not present

## 2016-11-16 DIAGNOSIS — Z794 Long term (current) use of insulin: Secondary | ICD-10-CM | POA: Diagnosis not present

## 2016-11-16 DIAGNOSIS — I69354 Hemiplegia and hemiparesis following cerebral infarction affecting left non-dominant side: Secondary | ICD-10-CM | POA: Diagnosis not present

## 2016-11-16 DIAGNOSIS — N183 Chronic kidney disease, stage 3 (moderate): Secondary | ICD-10-CM | POA: Diagnosis not present

## 2016-11-16 DIAGNOSIS — E1065 Type 1 diabetes mellitus with hyperglycemia: Secondary | ICD-10-CM | POA: Diagnosis not present

## 2016-11-17 DIAGNOSIS — J449 Chronic obstructive pulmonary disease, unspecified: Secondary | ICD-10-CM | POA: Diagnosis not present

## 2016-11-17 DIAGNOSIS — E1022 Type 1 diabetes mellitus with diabetic chronic kidney disease: Secondary | ICD-10-CM | POA: Diagnosis not present

## 2016-11-17 DIAGNOSIS — I130 Hypertensive heart and chronic kidney disease with heart failure and stage 1 through stage 4 chronic kidney disease, or unspecified chronic kidney disease: Secondary | ICD-10-CM | POA: Diagnosis not present

## 2016-11-17 DIAGNOSIS — E1065 Type 1 diabetes mellitus with hyperglycemia: Secondary | ICD-10-CM | POA: Diagnosis not present

## 2016-11-17 DIAGNOSIS — N183 Chronic kidney disease, stage 3 (moderate): Secondary | ICD-10-CM | POA: Diagnosis not present

## 2016-11-17 DIAGNOSIS — I5030 Unspecified diastolic (congestive) heart failure: Secondary | ICD-10-CM | POA: Diagnosis not present

## 2016-11-19 DIAGNOSIS — I480 Paroxysmal atrial fibrillation: Secondary | ICD-10-CM | POA: Diagnosis not present

## 2016-11-19 DIAGNOSIS — G894 Chronic pain syndrome: Secondary | ICD-10-CM | POA: Diagnosis not present

## 2016-11-19 DIAGNOSIS — E1142 Type 2 diabetes mellitus with diabetic polyneuropathy: Secondary | ICD-10-CM | POA: Diagnosis not present

## 2016-11-19 DIAGNOSIS — R42 Dizziness and giddiness: Secondary | ICD-10-CM | POA: Diagnosis not present

## 2016-11-19 DIAGNOSIS — R0789 Other chest pain: Secondary | ICD-10-CM | POA: Diagnosis not present

## 2016-11-19 DIAGNOSIS — E161 Other hypoglycemia: Secondary | ICD-10-CM | POA: Diagnosis not present

## 2016-11-20 ENCOUNTER — Ambulatory Visit: Payer: Medicare Other | Admitting: Pulmonary Disease

## 2016-11-25 DIAGNOSIS — N183 Chronic kidney disease, stage 3 (moderate): Secondary | ICD-10-CM | POA: Diagnosis not present

## 2016-11-25 DIAGNOSIS — I5030 Unspecified diastolic (congestive) heart failure: Secondary | ICD-10-CM | POA: Diagnosis not present

## 2016-11-25 DIAGNOSIS — E1022 Type 1 diabetes mellitus with diabetic chronic kidney disease: Secondary | ICD-10-CM | POA: Diagnosis not present

## 2016-11-25 DIAGNOSIS — I130 Hypertensive heart and chronic kidney disease with heart failure and stage 1 through stage 4 chronic kidney disease, or unspecified chronic kidney disease: Secondary | ICD-10-CM | POA: Diagnosis not present

## 2016-11-25 DIAGNOSIS — J449 Chronic obstructive pulmonary disease, unspecified: Secondary | ICD-10-CM | POA: Diagnosis not present

## 2016-11-25 DIAGNOSIS — E1065 Type 1 diabetes mellitus with hyperglycemia: Secondary | ICD-10-CM | POA: Diagnosis not present

## 2016-11-27 DIAGNOSIS — E1065 Type 1 diabetes mellitus with hyperglycemia: Secondary | ICD-10-CM | POA: Diagnosis not present

## 2016-11-27 DIAGNOSIS — I5030 Unspecified diastolic (congestive) heart failure: Secondary | ICD-10-CM | POA: Diagnosis not present

## 2016-11-27 DIAGNOSIS — E1022 Type 1 diabetes mellitus with diabetic chronic kidney disease: Secondary | ICD-10-CM | POA: Diagnosis not present

## 2016-11-27 DIAGNOSIS — N183 Chronic kidney disease, stage 3 (moderate): Secondary | ICD-10-CM | POA: Diagnosis not present

## 2016-11-27 DIAGNOSIS — I130 Hypertensive heart and chronic kidney disease with heart failure and stage 1 through stage 4 chronic kidney disease, or unspecified chronic kidney disease: Secondary | ICD-10-CM | POA: Diagnosis not present

## 2016-11-27 DIAGNOSIS — J449 Chronic obstructive pulmonary disease, unspecified: Secondary | ICD-10-CM | POA: Diagnosis not present

## 2016-12-03 DIAGNOSIS — J449 Chronic obstructive pulmonary disease, unspecified: Secondary | ICD-10-CM | POA: Diagnosis not present

## 2016-12-03 DIAGNOSIS — I130 Hypertensive heart and chronic kidney disease with heart failure and stage 1 through stage 4 chronic kidney disease, or unspecified chronic kidney disease: Secondary | ICD-10-CM | POA: Diagnosis not present

## 2016-12-10 DIAGNOSIS — M25512 Pain in left shoulder: Secondary | ICD-10-CM | POA: Diagnosis not present

## 2016-12-12 DIAGNOSIS — M19011 Primary osteoarthritis, right shoulder: Secondary | ICD-10-CM | POA: Diagnosis not present

## 2016-12-14 ENCOUNTER — Ambulatory Visit (HOSPITAL_BASED_OUTPATIENT_CLINIC_OR_DEPARTMENT_OTHER): Payer: Medicare Other | Attending: Acute Care | Admitting: Pulmonary Disease

## 2016-12-14 VITALS — Ht 67.0 in | Wt 274.0 lb

## 2016-12-14 DIAGNOSIS — G4730 Sleep apnea, unspecified: Secondary | ICD-10-CM

## 2016-12-14 DIAGNOSIS — G4733 Obstructive sleep apnea (adult) (pediatric): Secondary | ICD-10-CM | POA: Insufficient documentation

## 2016-12-24 DIAGNOSIS — E1142 Type 2 diabetes mellitus with diabetic polyneuropathy: Secondary | ICD-10-CM | POA: Diagnosis not present

## 2016-12-24 DIAGNOSIS — E1165 Type 2 diabetes mellitus with hyperglycemia: Secondary | ICD-10-CM | POA: Diagnosis not present

## 2016-12-24 DIAGNOSIS — Z794 Long term (current) use of insulin: Secondary | ICD-10-CM | POA: Diagnosis not present

## 2016-12-28 NOTE — Procedures (Signed)
     Patient Name: Jon [name], Jon [name] Date: 12/14/2016   Gender: Male  D.O.B: 04/08/1951  Age (years): 48  Referring Provider: Magdalen Spatz NP  Height (inches): 85  Interpreting Physician: [name] MD, ABSM  Weight (lbs): 274  RPSGT: Baxter Flattery   BMI: 42  MRN: [id number]  Neck Size: 21.00    CLINICAL INFORMATION  Sleep Study Type: NPSG  Indication for sleep study: Congestive Heart Failure, Diabetes, Fatigue, Morbid Obesity, Morning Headaches, Obesity, Snoring, Witnesses Apnea / Gasping During Sleep  Epworth Sleepiness Score: 18  SLEEP STUDY TECHNIQUE  As per the AASM Manual for the Scoring of Sleep and Associated Events v2.3 (April 2016) with a hypopnea requiring 4% desaturations.  The channels recorded and monitored were frontal, central and occipital EEG, electrooculogram (EOG), submentalis EMG (chin), nasal and oral airflow, thoracic and abdominal wall motion, anterior tibialis EMG, snore microphone, electrocardiogram, and pulse oximetry.  MEDICATIONS  Medications self-administered by patient taken the night of the study : N/A  SLEEP ARCHITECTURE  The study was initiated at 10:08:55 PM and ended at 5:11:50 AM.  Sleep onset time was 4.4 minutes and the sleep efficiency was 20.2%. The total sleep time was 85.5 minutes.  Stage REM latency was N/A minutes.  The patient spent 88.89% of the night in stage N1 sleep, 11.11% in stage N2 sleep, 0.00% in stage N3 and 0.00% in REM.  Alpha intrusion was absent.  Supine sleep was 98.83%.  RESPIRATORY PARAMETERS  The overall apnea/hypopnea index (AHI) was 21.8 per hour. There were 31 total apneas, including 24 obstructive, 5 central and 2 mixed apneas. There were 0 hypopneas and 77 RERAs. The AHI during Stage REM sleep was N/A per hour. AHI while supine was 21.3 per hour. The mean oxygen saturation was 89.62%. The minimum SpO2 during sleep was 82.00%. Loud snoring was noted during this study. CARDIAC DATA  The 2 lead EKG  demonstrated sinus rhythm. The mean heart rate was 57.15 beats per minute. Other EKG findings include: None.  LEG MOVEMENT DATA  The total PLMS were 0 with a resulting PLMS index of 0.00. Associated arousal with leg movement index was 0.0 . IMPRESSIONS  - This study showed moderate obstructive sleep apnea occurred during this study (AHI = 21.8/h).  - No significant central sleep apnea occurred during this study (CAI = 3.5/h).  - Mild oxygen desaturation was noted during this study (Min O2 = 82.00%). DIAGNOSIS  - Obstructive Sleep Apnea (327.23 [G47.33 ICD-10]). RECOMMENDATIONS  - He should return to the sleep lab for a full night titration study starting with CPAP. He can then transition to Bipap +/- supplemental oxygen as needed. [Electronically signed] 12/29/2016 12:03 AM [name] MD, [place], American Board of Sleep Medicine  NPI: [id number]

## 2016-12-29 ENCOUNTER — Telehealth: Payer: Self-pay | Admitting: Pulmonary Disease

## 2016-12-29 DIAGNOSIS — J018 Other acute sinusitis: Secondary | ICD-10-CM | POA: Diagnosis not present

## 2016-12-29 DIAGNOSIS — M25511 Pain in right shoulder: Secondary | ICD-10-CM | POA: Diagnosis not present

## 2016-12-29 DIAGNOSIS — G4733 Obstructive sleep apnea (adult) (pediatric): Secondary | ICD-10-CM

## 2016-12-29 NOTE — Telephone Encounter (Signed)
PSG 12/14/16 >> AHI 21.8, SpO2 82%   Will have my nurse inform pt that sleep study showed moderate obstructive sleep apnea.  Please schedule in lab titration study to determine optimal therapy for his sleep apnea.

## 2016-12-31 NOTE — Telephone Encounter (Signed)
ATC home #, number not available at this time (automated message) ATC cell #, voicemail not set up [facility]

## 2017-01-01 ENCOUNTER — Encounter: Payer: Self-pay | Admitting: Pulmonary Disease

## 2017-01-01 ENCOUNTER — Ambulatory Visit (INDEPENDENT_AMBULATORY_CARE_PROVIDER_SITE_OTHER): Payer: Medicare Other | Admitting: Pulmonary Disease

## 2017-01-01 VITALS — BP 172/80 | HR 76 | Ht 67.0 in | Wt 278.8 lb

## 2017-01-01 DIAGNOSIS — J9611 Chronic respiratory failure with hypoxia: Secondary | ICD-10-CM | POA: Diagnosis not present

## 2017-01-01 DIAGNOSIS — J8489 Other specified interstitial pulmonary diseases: Secondary | ICD-10-CM

## 2017-01-01 DIAGNOSIS — J449 Chronic obstructive pulmonary disease, unspecified: Secondary | ICD-10-CM

## 2017-01-01 DIAGNOSIS — E662 Morbid (severe) obesity with alveolar hypoventilation: Secondary | ICD-10-CM

## 2017-01-01 DIAGNOSIS — G4733 Obstructive sleep apnea (adult) (pediatric): Secondary | ICD-10-CM | POA: Diagnosis not present

## 2017-01-01 NOTE — Telephone Encounter (Signed)
Results have been explained to patient, Jon [name] expressed understanding -- Jon [name] being seen today in office with Dr Halford Chessman. Nothing further needed.

## 2017-01-01 NOTE — Progress Notes (Signed)
Current Outpatient Prescriptions on File Prior to Visit  Medication Sig  . albuterol (PROVENTIL HFA;VENTOLIN HFA) 108 (90 BASE) MCG/ACT inhaler Inhale 2 puffs into the lungs every 6 (six) hours as needed for wheezing or shortness of breath.  Marland Kitchen albuterol (PROVENTIL) (2.5 MG/3ML) 0.083% nebulizer solution Take 3 mLs (2.5 mg total) by nebulization 2 (two) times daily. And as needed  . atorvastatin (LIPITOR) 40 MG tablet Take 40 mg by mouth daily.  Marland Kitchen azelastine (ASTELIN) 137 MCG/SPRAY nasal spray Place 1 spray into both nostrils at bedtime as needed for allergies.   Marland Kitchen diltiazem (CARDIZEM) 30 MG tablet Take 30 mg by mouth 2 (two) times daily.  . famotidine (PEPCID) 20 MG tablet TAKE 1 TABLET BY MOUTH AT BEDTIME  . fluticasone (FLONASE) 50 MCG/ACT nasal spray Place 1 spray into both nostrils daily as needed for allergies.   . Fluticasone-Salmeterol (ADVAIR) 500-50 MCG/DOSE AEPB Inhale 1 puff into the lungs every 12 (twelve) hours.  . Insulin Human (INSULIN PUMP) SOLN Inject 1 each into the skin continuous. Inject 0.8 units of novolog every hour per insulin pump.  Dose Increased due to chronic steroid use  . isosorbide mononitrate (IMDUR) 60 MG 24 hr tablet Take 60 mg by mouth every morning.  . latanoprost (XALATAN) 0.005 % ophthalmic solution Place 1 drop into both eyes at bedtime.  Marland Kitchen MOVANTIK 25 MG TABS Take 25 mg by mouth daily.   . Multiple Vitamins-Minerals (CENTRUM SILVER PO) Take 1 tablet by mouth daily.  . nitroGLYCERIN (NITROSTAT) 0.4 MG SL tablet Place 0.4 mg under the tongue every 5 (five) minutes as needed. For chest pain  . OXYCONTIN 20 MG 12 hr tablet Take 20 mg by mouth every 12 (twelve) hours.   . pantoprazole (PROTONIX) 40 MG tablet Take 40 mg by mouth 2 (two) times daily.  Vladimir Faster Glycol-Propyl Glycol (SYSTANE PRESERVATIVE FREE) 0.4-0.3 % SOLN Apply 1 drop to eye 4 (four) times daily as needed (dryness of eye).   . predniSONE (DELTASONE) 10 MG tablet 1.5 pills daiy for 2 weeks,  then 1 pill daily  . pregabalin (LYRICA) 75 MG capsule Take 1 capsule (75 mg total) by mouth 2 (two) times daily.  . Rivaroxaban (XARELTO) 15 MG TABS tablet Take 15 mg by mouth daily.  Marland Kitchen torsemide (DEMADEX) 20 MG tablet Take 20 mg by mouth 2 (two) times daily.   No current facility-administered medications on file prior to visit.      Chief Complaint  Patient presents with  . Follow-up    States his breathing is unchanged. Pt states that he has good days and bad days. Using 2 liters O2 pulsed PRN. Pt uses 2 liters with CPAP. Pt reports having sleep study -- needs results today.      Pulmonary tests Serology 01/19/14 >> all negative VATS lung bx 01/24/14 >> organizing pneumonia Spirometry 06/11/15 >> FEV1 1.27 (39%), FEV1% 67 CT chest 08/08/15 >> ASD RLL CT chest 10/16/15 >> minimal Lt pleural thickening, improved aeration RUL and RLL, persistent b/l lower lobe recticulo-nodular opacities, dilated esophagus with distal wall thickening  Cardiac tests Echo 01/31/14 >> EF 55 to 60%, mod LA and severe RA dilation  Sleep tests PSG 12/14/16 >> AHI 21.8, SpO2 82%  Past medical history A fib, HTN, HLD, RLS, GERD, Hypothyroidism, CVA, Fibromyalgia  Past surgical hx, Allergies, Family hx, Social hx all reviewed.  Vital Signs BP (!) 172/80 (BP Location: Left Arm, Cuff Size: Normal)   Pulse 76   Ht 5\' 7"  (  1.702 m)   Wt 278 lb 12.8 oz (126.5 kg)   SpO2 96%   BMI 43.67 kg/m   History of Present Illness Jon [name] is a 66 y.o. 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  His breathing has been up and down.  He is still having cough and chest congestion.  He has not had fever, or hemoptysis recently.  Denies chest pain.  He is having trouble with his sleep apnea set up.  He is not sure he is on correct settings.  He is still snoring some while wearing machine.  He had sleep study which showed moderate OSA.   Physical Exam  General - pleasant Eyes -  pupils reactive ENT - no sinus tenderness, no oral exudate, no LAN, MP 3 Cardiac - regular, no murmur Chest - no wheeze, rales Abd - soft, non tender Ext - no edema Skin - no rashes Neuro - normal strength Psych - normal mood  CXR from 11/11/16 - mild interstitial edema  Assessment/Plan  Recurrent pneumonia with hemoptysis likely from recurrent aspiration. - monitor clinically  COPD with chronic bronchitis. - continue advair and prn albuterol  BOOP. - continue prednisone 10 mg daily for now  Chronic respiratory failure from OSA, OHS. - will arrange for in lab titration study to determine if should continue with CPAP versus Bipap +/- supplemental oxygen  - continue CPAP with 2 liters oxygen at night pending titration study results   Patient Instructions  Will arrange for CPAP titration study  Will call to schedule follow up after sleep study reviewed  Time spent 28 minutes  Jon Mires, MD [place] [facility]/Sleep Pager:  [phone] 01/01/2017, 10:18 AM

## 2017-01-01 NOTE — Patient Instructions (Signed)
Will arrange for CPAP titration study  Will call to schedule follow up after sleep study reviewed

## 2017-01-07 ENCOUNTER — Telehealth: Payer: Self-pay | Admitting: Acute Care

## 2017-01-07 NOTE — Telephone Encounter (Signed)
SG please advise, as this encounter is blank. Thanks.

## 2017-01-08 NOTE — Telephone Encounter (Signed)
CPAP titratioin has been scheduled for 01/25/17 @ 800pm.

## 2017-01-08 NOTE — Telephone Encounter (Signed)
Thanks so much. 

## 2017-01-08 NOTE — Telephone Encounter (Signed)
This patient needs to be scheduled for a CPAP titration. Dr. Halford Chessman placed the order, but I don't see that it has been scheduled.Thanks so much.

## 2017-01-19 ENCOUNTER — Encounter (HOSPITAL_COMMUNITY): Payer: Self-pay

## 2017-01-25 ENCOUNTER — Encounter (HOSPITAL_BASED_OUTPATIENT_CLINIC_OR_DEPARTMENT_OTHER): Payer: Medicare Other

## 2017-02-03 ENCOUNTER — Telehealth: Payer: Self-pay | Admitting: Acute Care

## 2017-02-03 NOTE — Telephone Encounter (Signed)
   Please call patient and let him know his sleep study indicated sleep apnea. Please schedule for full night titration study starting with CPAP  He can then transition to Bipap +/- supplemental oxygen as needed. Thanks so much.   IMPRESSIONS   - This study showed moderate obstructive sleep apnea occurred during this study (AHI = 21.8/h).   - No significant central sleep apnea occurred during this study (CAI = 3.5/h).   - Mild oxygen desaturation was noted during this study (Min O2 = 82.00%).  DIAGNOSIS   - Obstructive Sleep Apnea (327.23 [G47.33 ICD-10]).  RECOMMENDATIONS   - He should return to the sleep lab for a full night titration study starting with CPAP. He can then transition to Bipap +/- supplemental oxygen as needed.

## 2017-02-03 NOTE — Telephone Encounter (Signed)
Called pt and someone answered but could not hear  Called back and line busy

## 2017-02-04 NOTE — Telephone Encounter (Signed)
ATC x 2 line busy. WCB.

## 2017-02-09 ENCOUNTER — Other Ambulatory Visit: Payer: Self-pay | Admitting: Acute Care

## 2017-02-09 NOTE — Telephone Encounter (Addendum)
Contacted patient and made him aware of SG results and recommendations. CPAP titration order was previously entered and pt has been scheduled for study to occur on 02/17/17. Pt was reminded of appointment and verbalized understanding. Nothing further is needed.

## 2017-02-17 ENCOUNTER — Encounter (HOSPITAL_BASED_OUTPATIENT_CLINIC_OR_DEPARTMENT_OTHER): Payer: Self-pay

## 2017-02-17 ENCOUNTER — Ambulatory Visit (HOSPITAL_BASED_OUTPATIENT_CLINIC_OR_DEPARTMENT_OTHER): Payer: Medicare Other | Admitting: Pulmonary Disease

## 2017-02-17 VITALS — Ht 67.0 in | Wt 238.0 lb

## 2017-02-23 DIAGNOSIS — R600 Localized edema: Secondary | ICD-10-CM | POA: Diagnosis not present

## 2017-02-23 DIAGNOSIS — M1711 Unilateral primary osteoarthritis, right knee: Secondary | ICD-10-CM | POA: Diagnosis not present

## 2017-02-23 DIAGNOSIS — D6489 Other specified anemias: Secondary | ICD-10-CM | POA: Diagnosis not present

## 2017-02-23 DIAGNOSIS — I10 Essential (primary) hypertension: Secondary | ICD-10-CM | POA: Diagnosis not present

## 2017-02-23 DIAGNOSIS — I87313 Chronic venous hypertension (idiopathic) with ulcer of bilateral lower extremity: Secondary | ICD-10-CM | POA: Diagnosis not present

## 2017-02-23 DIAGNOSIS — I482 Chronic atrial fibrillation: Secondary | ICD-10-CM | POA: Diagnosis not present

## 2017-02-23 DIAGNOSIS — E782 Mixed hyperlipidemia: Secondary | ICD-10-CM | POA: Diagnosis not present

## 2017-02-23 DIAGNOSIS — I5032 Chronic diastolic (congestive) heart failure: Secondary | ICD-10-CM | POA: Diagnosis not present

## 2017-02-23 DIAGNOSIS — E1121 Type 2 diabetes mellitus with diabetic nephropathy: Secondary | ICD-10-CM | POA: Diagnosis not present

## 2017-02-26 ENCOUNTER — Telehealth: Payer: Self-pay | Admitting: Cardiology

## 2017-02-26 DIAGNOSIS — E11628 Type 2 diabetes mellitus with other skin complications: Secondary | ICD-10-CM | POA: Diagnosis not present

## 2017-02-26 DIAGNOSIS — I2510 Atherosclerotic heart disease of native coronary artery without angina pectoris: Secondary | ICD-10-CM | POA: Diagnosis not present

## 2017-02-26 DIAGNOSIS — G629 Polyneuropathy, unspecified: Secondary | ICD-10-CM | POA: Diagnosis not present

## 2017-02-26 DIAGNOSIS — S51811A Laceration without foreign body of right forearm, initial encounter: Secondary | ICD-10-CM | POA: Diagnosis not present

## 2017-02-26 DIAGNOSIS — I872 Venous insufficiency (chronic) (peripheral): Secondary | ICD-10-CM | POA: Diagnosis not present

## 2017-02-26 DIAGNOSIS — I87312 Chronic venous hypertension (idiopathic) with ulcer of left lower extremity: Secondary | ICD-10-CM | POA: Diagnosis not present

## 2017-02-26 DIAGNOSIS — S51801A Unspecified open wound of right forearm, initial encounter: Secondary | ICD-10-CM | POA: Diagnosis not present

## 2017-02-26 DIAGNOSIS — I4891 Unspecified atrial fibrillation: Secondary | ICD-10-CM | POA: Diagnosis not present

## 2017-02-26 DIAGNOSIS — G4730 Sleep apnea, unspecified: Secondary | ICD-10-CM | POA: Diagnosis not present

## 2017-02-26 DIAGNOSIS — Z794 Long term (current) use of insulin: Secondary | ICD-10-CM | POA: Diagnosis not present

## 2017-02-26 DIAGNOSIS — H401132 Primary open-angle glaucoma, bilateral, moderate stage: Secondary | ICD-10-CM | POA: Diagnosis not present

## 2017-02-26 DIAGNOSIS — N289 Disorder of kidney and ureter, unspecified: Secondary | ICD-10-CM | POA: Diagnosis not present

## 2017-02-26 DIAGNOSIS — S51001A Unspecified open wound of right elbow, initial encounter: Secondary | ICD-10-CM | POA: Diagnosis not present

## 2017-02-26 DIAGNOSIS — L97822 Non-pressure chronic ulcer of other part of left lower leg with fat layer exposed: Secondary | ICD-10-CM | POA: Diagnosis not present

## 2017-02-26 DIAGNOSIS — Z961 Presence of intraocular lens: Secondary | ICD-10-CM | POA: Diagnosis not present

## 2017-02-26 DIAGNOSIS — M1990 Unspecified osteoarthritis, unspecified site: Secondary | ICD-10-CM | POA: Diagnosis not present

## 2017-02-26 DIAGNOSIS — I10 Essential (primary) hypertension: Secondary | ICD-10-CM | POA: Diagnosis not present

## 2017-02-26 DIAGNOSIS — K219 Gastro-esophageal reflux disease without esophagitis: Secondary | ICD-10-CM | POA: Diagnosis not present

## 2017-02-26 DIAGNOSIS — I739 Peripheral vascular disease, unspecified: Secondary | ICD-10-CM | POA: Diagnosis not present

## 2017-02-26 DIAGNOSIS — J449 Chronic obstructive pulmonary disease, unspecified: Secondary | ICD-10-CM | POA: Diagnosis not present

## 2017-02-26 DIAGNOSIS — S51011A Laceration without foreign body of right elbow, initial encounter: Secondary | ICD-10-CM | POA: Diagnosis not present

## 2017-02-26 NOTE — Telephone Encounter (Signed)
Closed encounter °

## 2017-03-02 DIAGNOSIS — S51801A Unspecified open wound of right forearm, initial encounter: Secondary | ICD-10-CM | POA: Diagnosis not present

## 2017-03-02 DIAGNOSIS — S51001A Unspecified open wound of right elbow, initial encounter: Secondary | ICD-10-CM | POA: Diagnosis not present

## 2017-03-02 DIAGNOSIS — L97822 Non-pressure chronic ulcer of other part of left lower leg with fat layer exposed: Secondary | ICD-10-CM | POA: Diagnosis not present

## 2017-03-02 DIAGNOSIS — E11628 Type 2 diabetes mellitus with other skin complications: Secondary | ICD-10-CM | POA: Diagnosis not present

## 2017-03-02 DIAGNOSIS — I872 Venous insufficiency (chronic) (peripheral): Secondary | ICD-10-CM | POA: Diagnosis not present

## 2017-03-03 DIAGNOSIS — D6489 Other specified anemias: Secondary | ICD-10-CM | POA: Diagnosis not present

## 2017-03-05 DIAGNOSIS — S51001A Unspecified open wound of right elbow, initial encounter: Secondary | ICD-10-CM | POA: Diagnosis not present

## 2017-03-05 DIAGNOSIS — H40003 Preglaucoma, unspecified, bilateral: Secondary | ICD-10-CM | POA: Diagnosis not present

## 2017-03-05 DIAGNOSIS — S51011A Laceration without foreign body of right elbow, initial encounter: Secondary | ICD-10-CM | POA: Diagnosis not present

## 2017-03-05 DIAGNOSIS — S51812A Laceration without foreign body of left forearm, initial encounter: Secondary | ICD-10-CM | POA: Diagnosis not present

## 2017-03-05 DIAGNOSIS — I87312 Chronic venous hypertension (idiopathic) with ulcer of left lower extremity: Secondary | ICD-10-CM | POA: Diagnosis not present

## 2017-03-05 DIAGNOSIS — S51801A Unspecified open wound of right forearm, initial encounter: Secondary | ICD-10-CM | POA: Diagnosis not present

## 2017-03-05 DIAGNOSIS — E11628 Type 2 diabetes mellitus with other skin complications: Secondary | ICD-10-CM | POA: Diagnosis not present

## 2017-03-05 DIAGNOSIS — I872 Venous insufficiency (chronic) (peripheral): Secondary | ICD-10-CM | POA: Diagnosis not present

## 2017-03-05 DIAGNOSIS — L97822 Non-pressure chronic ulcer of other part of left lower leg with fat layer exposed: Secondary | ICD-10-CM | POA: Diagnosis not present

## 2017-03-09 DIAGNOSIS — I87312 Chronic venous hypertension (idiopathic) with ulcer of left lower extremity: Secondary | ICD-10-CM | POA: Diagnosis not present

## 2017-03-09 DIAGNOSIS — Z09 Encounter for follow-up examination after completed treatment for conditions other than malignant neoplasm: Secondary | ICD-10-CM | POA: Diagnosis not present

## 2017-03-09 DIAGNOSIS — L97829 Non-pressure chronic ulcer of other part of left lower leg with unspecified severity: Secondary | ICD-10-CM | POA: Diagnosis not present

## 2017-03-09 DIAGNOSIS — S51011D Laceration without foreign body of right elbow, subsequent encounter: Secondary | ICD-10-CM | POA: Diagnosis not present

## 2017-03-09 DIAGNOSIS — S51811D Laceration without foreign body of right forearm, subsequent encounter: Secondary | ICD-10-CM | POA: Diagnosis not present

## 2017-03-14 ENCOUNTER — Encounter (HOSPITAL_BASED_OUTPATIENT_CLINIC_OR_DEPARTMENT_OTHER): Payer: Medicare Other

## 2017-03-14 ENCOUNTER — Encounter (HOSPITAL_BASED_OUTPATIENT_CLINIC_OR_DEPARTMENT_OTHER): Payer: Self-pay

## 2017-03-18 DIAGNOSIS — M25561 Pain in right knee: Secondary | ICD-10-CM | POA: Diagnosis not present

## 2017-03-18 DIAGNOSIS — M25562 Pain in left knee: Secondary | ICD-10-CM | POA: Diagnosis not present

## 2017-03-18 DIAGNOSIS — G8929 Other chronic pain: Secondary | ICD-10-CM

## 2017-03-18 DIAGNOSIS — G894 Chronic pain syndrome: Secondary | ICD-10-CM | POA: Diagnosis not present

## 2017-03-18 DIAGNOSIS — E785 Hyperlipidemia, unspecified: Secondary | ICD-10-CM | POA: Diagnosis not present

## 2017-03-18 DIAGNOSIS — M170 Bilateral primary osteoarthritis of knee: Secondary | ICD-10-CM | POA: Insufficient documentation

## 2017-03-18 DIAGNOSIS — E669 Obesity, unspecified: Secondary | ICD-10-CM | POA: Diagnosis not present

## 2017-03-18 DIAGNOSIS — I10 Essential (primary) hypertension: Secondary | ICD-10-CM | POA: Diagnosis not present

## 2017-03-18 DIAGNOSIS — E119 Type 2 diabetes mellitus without complications: Secondary | ICD-10-CM | POA: Diagnosis not present

## 2017-03-18 HISTORY — DX: Other chronic pain: G89.29

## 2017-03-18 HISTORY — DX: Bilateral primary osteoarthritis of knee: M17.0

## 2017-03-19 ENCOUNTER — Other Ambulatory Visit: Payer: Self-pay | Admitting: Cardiology

## 2017-03-23 ENCOUNTER — Ambulatory Visit: Payer: Medicare Other | Admitting: Cardiology

## 2017-04-07 ENCOUNTER — Ambulatory Visit (INDEPENDENT_AMBULATORY_CARE_PROVIDER_SITE_OTHER): Payer: Medicare Other | Admitting: Cardiology

## 2017-04-07 VITALS — BP 144/80 | HR 80 | Ht 67.0 in | Wt 288.0 lb

## 2017-04-07 DIAGNOSIS — J449 Chronic obstructive pulmonary disease, unspecified: Secondary | ICD-10-CM | POA: Diagnosis not present

## 2017-04-07 DIAGNOSIS — I482 Chronic atrial fibrillation, unspecified: Secondary | ICD-10-CM

## 2017-04-07 DIAGNOSIS — Z0389 Encounter for observation for other suspected diseases and conditions ruled out: Secondary | ICD-10-CM

## 2017-04-07 DIAGNOSIS — IMO0001 Reserved for inherently not codable concepts without codable children: Secondary | ICD-10-CM

## 2017-04-07 DIAGNOSIS — I5032 Chronic diastolic (congestive) heart failure: Secondary | ICD-10-CM

## 2017-04-07 MED ORDER — METOLAZONE 2.5 MG PO TABS: 2.5000 mg | ORAL_TABLET | ORAL | 3 refills | Status: DC

## 2017-04-07 NOTE — Progress Notes (Signed)
Cardiology Office Note:    Date:  04/07/2017   ID:  Jon [name], DOB 06/10/1951, MRN [id number]  PCP:  Rochel Brome, MD Please do a BMP in 1 week starting on zaroxalyn Cardiologist:  Shirlee More, MD      ASSESSMENT:    1. Chronic atrial fibrillation ([place])   2. Chronic diastolic heart failure ([place])   3. Chronic obstructive pulmonary disease, unspecified COPD type ([place])   4. Normal coronary arteries 2011    PLAN:    In order of problems listed above:  1. Stable rate controlled continue calcium channel blocker and anticoagulation. 2. Mildly decompensated more peripheral edema is multifactorial related to lung disease calcium channel blocker venous thrombolytic embolism and heart failure. I'll simplify his regimen to a single loop diuretic and begin him on Zaroxolyn 2 days per week. I asked him about follow-up renal function assessed locally with his PCP next week. 3. Stable severe managed by pulmonary   Next appointment: 3 months   Medication Adjustments/Labs and Tests Ordered: Current medicines are reviewed at length with the patient today.  Concerns regarding medicines are outlined above.  No orders of the defined types were placed in this encounter.  Meds ordered this encounter  Medications  . metolazone (ZAROXOLYN) 2.5 MG tablet    Sig: Take 1 tablet (2.5 mg total) by mouth 2 (two) times a week.    Dispense:  30 tablet    Refill:  3    Chief Complaint  Patient presents with  . Follow-up    Routine flup appt  . Congestive Heart Failure  . Atrial Fibrillation    History of Present Illness:    Jon [name] is a 66 y.o. male with a hx of CAD, CHF, Atrial Fibrillation, HTN, CVA, COPD and sleep apnea last seen in February 2018 after [facility] admission with decompensated COPD and heart failure.Despite taking high dose of 2 loop diuretics he continues to have peripheral edema he has blisters in the legs and seeks help managing his heart failure and chronic lower extremity  edema. He has atrial fibrillation requires a calcium channel blocker for rate control likely contributing and is not a candidate for beta blocker because of the severity of his COPD. Functionally is improved from when last seen him back to his usual severe limiting shortness of breath but no chest pain palpitation or syncope. Compliance with diet, lifestyle and medications: Yes Past Medical History:  Diagnosis Date  . Abnormal EKG 11/29/2014  . Acute renal insufficiency 11/29/2014  . Angina   . Arthritis    "knees" (01/18/2014)  . Asthma   . Atrial fibrillation ([place])   . Back pain 01/25/2015  . 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 BOOP (bronchiolitis obliterans with organizing pneumonia) ([place]) 01/20/2014   OLBx 01/20/14 BOOP Started steroids 02/28/14   . Chronic anticoagulation-Xarelto 02/09/2014  . Chronic atrial fibrillation ([place]) 01/03/2014  . Chronic bronchitis ([place]) 12/21/2013  . Chronic diastolic heart failure ([place]) 02/22/2015  . Chronic pain of both knees 03/18/2017   Overview:  Added automatically from request for surgery [id number]  . Chronic respiratory failure ([place]) 04/10/2015  . COPD (chronic obstructive pulmonary disease) ([place]) 01/25/2015  . Cough    coughing up blood- started last nite, seems to be worse now  . CVA (cerebral vascular accident) ([place]) 02/09/2014  . Dysrhythmia    atrial fib takes cardizem,   . Fibromyalgia   . GERD (gastroesophageal reflux disease)   . H/O hiatal hernia   .  Hemoptysis 08/01/2015  . Herpes zoster 06/26/2015  . History of stroke   . Hyperkalemia-repeat pending 11/29/2014  . Hyperlipemia   . Hyperlipidemia   . Hypertension   . Hypothyroidism   . IDDM (insulin dependent diabetes mellitus) (HCC)    insulin pump   . Long term (current) use of anticoagulants 03/21/2014  . Mild CAD 02/20/2015   Overview:  On recent cardiac cath  Overview:  On recent cardiac cath  . Myocardial infarction [facility])    "one dr says yes; another says no"  . Neuromuscular disorder  (HCC)    rls, neuropathy  . Neuropathy    rls, neuropathy   . Normal coronary arteries 2011 11/29/2014  . Obesity (BMI 30-39.9)   . On home oxygen therapy    "2L w/CPAP at bedtime" (01/18/2014)  . OSA on CPAP    cpap & oxygen  . Pericardial effusion   . Peritoneal effusion, chronic 02/22/2015   Overview:  With surgical window  . PNA (pneumonia) 04/10/2015  . Pneumonia 5/15   "several times" (01/18/2014)  . Precordial pain 01/26/2015  . Preoperative cardiovascular examination 02/24/2016  . Primary osteoarthritis of both knees 03/18/2017   Overview:  Added automatically from request for surgery [id number]  . Restless leg syndrome   . Sleep apnea    cpap & oxygen   . Stroke [facility]) 2012   denies residual on 01/18/2014  . Troponin level elevated 11/29/2014  . Uncontrolled type 2 diabetes mellitus with diabetic polyneuropathy, with long-term current use of insulin ([place]) 12/21/2013    Past Surgical History:  Procedure Laterality Date  . APPENDECTOMY    . CARDIAC CATHETERIZATION  X 2 then 03/03/2012    NL LVF, normal coronaries, vessels are small ([facility]: Dr. Beatrix Fetters)  . CATARACT EXTRACTION W/ INTRAOCULAR LENS  IMPLANT, BILATERAL Bilateral   . CHOLECYSTECTOMY    . HERNIA REPAIR     UHR  . LAPAROSCOPIC GASTRIC BANDING  2010  . PERICARDIAL WINDOW Left 01/24/2014   Procedure: PERICARDIAL WINDOW;  Surgeon: Gaye Pollack, MD;  Location: [place];  Service: Thoracic;  Laterality: Left;  . SINUS EXPLORATION  X 2  . UMBILICAL HERNIA REPAIR    . VIDEO ASSISTED THORACOSCOPY Left 01/24/2014   Procedure: VIDEO ASSISTED THORACOSCOPY;  Surgeon: Gaye Pollack, MD;  Location: [facility] OR;  Service: Thoracic;  Laterality: Left;  VATS/open lung biopsy  . VIDEO BRONCHOSCOPY Bilateral 12/29/2013   Procedure: VIDEO BRONCHOSCOPY WITHOUT FLUORO;  Surgeon: Elsie Stain, MD;  Location: [facility];  Service: Endoscopy;  Laterality: Bilateral;  . VIDEO BRONCHOSCOPY N/A 01/24/2014   Procedure: VIDEO BRONCHOSCOPY;  Surgeon: Gaye Pollack, MD;  Location: [facility] OR;  Service: Thoracic;  Laterality: N/A;    Current Medications: Current Meds  Medication Sig  . albuterol (PROVENTIL HFA;VENTOLIN HFA) 108 (90 BASE) MCG/ACT inhaler Inhale 2 puffs into the lungs every 6 (six) hours as needed for wheezing or shortness of breath.  Marland Kitchen albuterol (PROVENTIL) (2.5 MG/3ML) 0.083% nebulizer solution Take 3 mLs (2.5 mg total) by nebulization 2 (two) times daily. And as needed  . atorvastatin (LIPITOR) 40 MG tablet Take 40 mg by mouth daily.  Marland Kitchen azelastine (ASTELIN) 137 MCG/SPRAY nasal spray Place 1 spray into both nostrils at bedtime as needed for allergies.   . Calcium Carbonate-Vitamin D 600-400 MG-UNIT tablet Take 1 tablet by mouth 2 (two) times daily.  Marland Kitchen diltiazem (CARDIZEM) 120 MG tablet Take 120 mg by mouth daily.  . famotidine (PEPCID) 20 MG tablet TAKE  1 TABLET BY MOUTH AT BEDTIME  . ferrous sulfate 325 (65 FE) MG tablet Take 325 mg by mouth daily.  . fluticasone (FLONASE) 50 MCG/ACT nasal spray Place 1 spray into both nostrils daily as needed for allergies.   . Fluticasone-Salmeterol (ADVAIR) 500-50 MCG/DOSE AEPB Inhale 1 puff into the lungs every 12 (twelve) hours.  . Insulin Human (INSULIN PUMP) SOLN Inject 1 each into the skin continuous. Inject 0.8 units of novolog every hour per insulin pump.  Dose Increased due to chronic steroid use  . insulin regular human CONCENTRATED (HUMULIN R) 500 UNIT/ML injection Use up to 0.3 mL in insulin pump daily.  MDD=0.3 mL equivalent to 150 units of regular insulin  . isosorbide mononitrate (IMDUR) 60 MG 24 hr tablet Take 60 mg by mouth every morning.  . latanoprost (XALATAN) 0.005 % ophthalmic solution Place 1 drop into both eyes at bedtime.  Marland Kitchen MOVANTIK 25 MG TABS Take 25 mg by mouth daily.   . Multiple Vitamins-Minerals (CENTRUM SILVER PO) Take 1 tablet by mouth daily.  . nitroGLYCERIN (NITROSTAT) 0.4 MG SL tablet Place 0.4 mg under the tongue every 5 (five) minutes as needed. For chest pain   . OXYCONTIN 20 MG 12 hr tablet Take 20 mg by mouth every 12 (twelve) hours.   . OXYGEN Inhale into the lungs. 2 liters at bedtime  . pantoprazole (PROTONIX) 40 MG tablet Take 40 mg by mouth 2 (two) times daily.  Vladimir Faster Glycol-Propyl Glycol (SYSTANE PRESERVATIVE FREE) 0.4-0.3 % SOLN Apply 1 drop to eye 4 (four) times daily as needed (dryness of eye).   . Potassium Chloride ER 20 MEQ TBCR Take 20 mEq by mouth 3 (three) times daily.  . predniSONE (DELTASONE) 10 MG tablet 1.5 pills daiy for 2 weeks, then 1 pill daily (Patient taking differently: Take 10 mg by mouth daily. )  . pregabalin (LYRICA) 75 MG capsule Take 1 capsule (75 mg total) by mouth 2 (two) times daily.  Marland Kitchen torsemide (DEMADEX) 20 MG tablet Take 20 mg by mouth 3 (three) times daily.   Marland Kitchen umeclidinium-vilanterol (ANORO ELLIPTA) 62.5-25 MCG/INH AEPB Inhale 1 puff into the lungs daily.  Alveda Reasons 15 MG TABS tablet TAKE 1 TABLET BY MOUTH ONCE DAILY WITH EVENING MEAL  . [DISCONTINUED] bumetanide (BUMEX) 2 MG tablet Take 2 mg by mouth 2 (two) times daily.     Allergies:   Hydrocodone; Morphine; Duloxetine hcl; Codeine; and Penicillins   Social History   Social History  . Marital status: Married    Spouse name: N/A  . Number of children: N/A  . Years of education: N/A   Social History Main Topics  . Smoking status: Former Smoker    Years: 4.00    Types: Cigars    Quit date: 09/01/2006  . Smokeless tobacco: Never Used  . 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 Drug use: No  . Sexual activity: No   Other Topics Concern  . Not on file   Social History Narrative  . No narrative on file     Family History: The patient's family history includes Cancer in his mother; Diabetes in his sister; Heart disease in his father; Seizures in his sister; Stroke in his father. There is no history of Anesthesia problems, Hypotension, Malignant hyperthermia, or Pseudochol deficiency. ROS:   Please see  the history of present illness.    All other systems reviewed and are negative.  EKGs/Labs/Other Studies  Reviewed:    The following studies were reviewed today:    Recent Labs:Requested from his PCP No results found for requested labs within last 8760 hours.  Recent Lipid Panel    Component Value Date/Time   CHOL 91 01/31/2014 0337   TRIG 69 01/31/2014 0337   HDL 31 (L) 01/31/2014 0337   CHOLHDL 2.9 01/31/2014 0337   VLDL 14 01/31/2014 0337   LDLCALC 46 01/31/2014 0337    Physical Exam:    VS:  BP (!) 144/80 (BP Location: Right Arm, Patient Position: Sitting)   Pulse 80   Ht 5\' 7"  (1.702 m)   Wt 288 lb (130.6 kg)   SpO2 95%   BMI 45.11 kg/m     Wt Readings from Last 3 Encounters:  04/07/17 288 lb (130.6 kg)  02/17/17 238 lb (108 kg)  01/01/17 278 lb 12.8 oz (126.5 kg)     GEN:  COPD habitus, obese, in no acute distress HEENT: Normal NECK: No JVD; No carotid bruits LYMPHATICS: No lymphadenopathy CARDIAC:distant,  RRR, no murmurs, rubs, gallops RESPIRATORY:  Diffusely decreased BS ABDOMEN: Soft, non-tender, non-distended MUSCULOSKELETAL:  > 4 + edema edema; No deformity  SKIN: Warm and dry NEUROLOGIC:  Alert and oriented x 3 PSYCHIATRIC:  Normal affect    Signed, Shirlee More, MD  04/07/2017 5:08 PM    [place]

## 2017-04-07 NOTE — Patient Instructions (Addendum)
Medication Instructions:  Your physician has recommended you make the following change in your medication:  STOP bumex START metolazone 2.5 mg twice weekly    Labwork: None  Testing/Procedures: None  Follow-Up: Your physician recommends that you schedule a follow-up appointment in: 3 months.   Any Other Special Instructions Will Be Listed Below (If Applicable).     If you need a refill on your cardiac medications before your next appointment, please call your pharmacy.    KNOW YOUR HEART FAILURE ZONES  Green Zone (Your Goal):  Marland Kitchen No shortness of breath or trouble bleeding . No weight gain of more than 3 pounds in one day or 5 pounds in a week . No swelling in your ankles, feet, stomach, or hands . No chest discomfort, heaviness or pain  Yellow Zone (Call the Doctor to get help!): Having 1 or more of the following: . Weight gain of 3 pounds in 1 day or 5 pounds in 1 week . More swelling of your feet, ankles, stomach, or hands . It is harder to breath when lying down. You need to sit up . Chest discomfort, heaviness, or pain . You feel more tired or have less energy than normal . New or worsening dizziness . Dry hacking cough . You feel uneasy and you know something is not right  Red Zone (Call 911-EMERGENCY): . Struggling to breathe. This does not go away when you sit up. . Stronger and more regular amounts of chest discomfort . New confusion or can't think clearly . Fainting or near fainting   Resources form the American heart Association: RetroDivas.ch.jsp

## 2017-04-08 NOTE — Procedures (Signed)
Appointment canceled.

## 2017-04-13 DIAGNOSIS — I4891 Unspecified atrial fibrillation: Secondary | ICD-10-CM | POA: Diagnosis not present

## 2017-04-13 DIAGNOSIS — Z880 Allergy status to penicillin: Secondary | ICD-10-CM | POA: Diagnosis not present

## 2017-04-13 DIAGNOSIS — G2581 Restless legs syndrome: Secondary | ICD-10-CM | POA: Diagnosis not present

## 2017-04-13 DIAGNOSIS — Z7901 Long term (current) use of anticoagulants: Secondary | ICD-10-CM | POA: Diagnosis not present

## 2017-04-13 DIAGNOSIS — I509 Heart failure, unspecified: Secondary | ICD-10-CM | POA: Diagnosis not present

## 2017-04-13 DIAGNOSIS — G894 Chronic pain syndrome: Secondary | ICD-10-CM | POA: Diagnosis not present

## 2017-04-13 DIAGNOSIS — I130 Hypertensive heart and chronic kidney disease with heart failure and stage 1 through stage 4 chronic kidney disease, or unspecified chronic kidney disease: Secondary | ICD-10-CM | POA: Diagnosis not present

## 2017-04-13 DIAGNOSIS — E785 Hyperlipidemia, unspecified: Secondary | ICD-10-CM | POA: Diagnosis not present

## 2017-04-13 DIAGNOSIS — Z885 Allergy status to narcotic agent status: Secondary | ICD-10-CM | POA: Diagnosis not present

## 2017-04-13 DIAGNOSIS — Z87891 Personal history of nicotine dependence: Secondary | ICD-10-CM | POA: Diagnosis not present

## 2017-04-13 DIAGNOSIS — E1122 Type 2 diabetes mellitus with diabetic chronic kidney disease: Secondary | ICD-10-CM | POA: Diagnosis not present

## 2017-04-13 DIAGNOSIS — Z6841 Body Mass Index (BMI) 40.0 and over, adult: Secondary | ICD-10-CM | POA: Diagnosis not present

## 2017-04-13 DIAGNOSIS — J45909 Unspecified asthma, uncomplicated: Secondary | ICD-10-CM | POA: Diagnosis not present

## 2017-04-13 DIAGNOSIS — M170 Bilateral primary osteoarthritis of knee: Secondary | ICD-10-CM | POA: Diagnosis not present

## 2017-04-13 DIAGNOSIS — E039 Hypothyroidism, unspecified: Secondary | ICD-10-CM | POA: Diagnosis not present

## 2017-04-13 DIAGNOSIS — N189 Chronic kidney disease, unspecified: Secondary | ICD-10-CM | POA: Diagnosis not present

## 2017-04-13 DIAGNOSIS — Z794 Long term (current) use of insulin: Secondary | ICD-10-CM | POA: Diagnosis not present

## 2017-04-13 DIAGNOSIS — Z7952 Long term (current) use of systemic steroids: Secondary | ICD-10-CM | POA: Diagnosis not present

## 2017-04-13 DIAGNOSIS — Z888 Allergy status to other drugs, medicaments and biological substances status: Secondary | ICD-10-CM | POA: Diagnosis not present

## 2017-04-13 DIAGNOSIS — G4733 Obstructive sleep apnea (adult) (pediatric): Secondary | ICD-10-CM | POA: Diagnosis not present

## 2017-04-13 DIAGNOSIS — E669 Obesity, unspecified: Secondary | ICD-10-CM | POA: Diagnosis not present

## 2017-04-13 DIAGNOSIS — M1711 Unilateral primary osteoarthritis, right knee: Secondary | ICD-10-CM | POA: Diagnosis not present

## 2017-04-13 DIAGNOSIS — Z79899 Other long term (current) drug therapy: Secondary | ICD-10-CM | POA: Diagnosis not present

## 2017-04-30 DIAGNOSIS — M25562 Pain in left knee: Secondary | ICD-10-CM | POA: Diagnosis not present

## 2017-04-30 DIAGNOSIS — G894 Chronic pain syndrome: Secondary | ICD-10-CM | POA: Diagnosis not present

## 2017-04-30 DIAGNOSIS — G8929 Other chronic pain: Secondary | ICD-10-CM | POA: Diagnosis not present

## 2017-04-30 DIAGNOSIS — M25561 Pain in right knee: Secondary | ICD-10-CM | POA: Diagnosis not present

## 2017-04-30 DIAGNOSIS — M170 Bilateral primary osteoarthritis of knee: Secondary | ICD-10-CM | POA: Diagnosis not present

## 2017-05-05 DIAGNOSIS — K5903 Drug induced constipation: Secondary | ICD-10-CM | POA: Diagnosis not present

## 2017-05-05 DIAGNOSIS — J018 Other acute sinusitis: Secondary | ICD-10-CM | POA: Diagnosis not present

## 2017-05-05 DIAGNOSIS — D6489 Other specified anemias: Secondary | ICD-10-CM | POA: Diagnosis not present

## 2017-05-05 DIAGNOSIS — R0902 Hypoxemia: Secondary | ICD-10-CM | POA: Diagnosis not present

## 2017-05-05 DIAGNOSIS — R0602 Shortness of breath: Secondary | ICD-10-CM | POA: Diagnosis not present

## 2017-05-05 DIAGNOSIS — R51 Headache: Secondary | ICD-10-CM | POA: Diagnosis not present

## 2017-05-05 DIAGNOSIS — Z0000 Encounter for general adult medical examination without abnormal findings: Secondary | ICD-10-CM | POA: Diagnosis not present

## 2017-05-12 DIAGNOSIS — E669 Obesity, unspecified: Secondary | ICD-10-CM | POA: Diagnosis not present

## 2017-05-12 DIAGNOSIS — I509 Heart failure, unspecified: Secondary | ICD-10-CM | POA: Diagnosis not present

## 2017-05-12 DIAGNOSIS — Z79899 Other long term (current) drug therapy: Secondary | ICD-10-CM | POA: Diagnosis not present

## 2017-05-12 DIAGNOSIS — Z538 Procedure and treatment not carried out for other reasons: Secondary | ICD-10-CM | POA: Diagnosis not present

## 2017-05-12 DIAGNOSIS — Z87891 Personal history of nicotine dependence: Secondary | ICD-10-CM | POA: Diagnosis not present

## 2017-05-12 DIAGNOSIS — G894 Chronic pain syndrome: Secondary | ICD-10-CM | POA: Diagnosis not present

## 2017-05-12 DIAGNOSIS — Z885 Allergy status to narcotic agent status: Secondary | ICD-10-CM | POA: Diagnosis not present

## 2017-05-12 DIAGNOSIS — I4891 Unspecified atrial fibrillation: Secondary | ICD-10-CM | POA: Diagnosis not present

## 2017-05-12 DIAGNOSIS — M25561 Pain in right knee: Secondary | ICD-10-CM | POA: Diagnosis not present

## 2017-05-12 DIAGNOSIS — M1990 Unspecified osteoarthritis, unspecified site: Secondary | ICD-10-CM | POA: Diagnosis not present

## 2017-05-12 DIAGNOSIS — N189 Chronic kidney disease, unspecified: Secondary | ICD-10-CM | POA: Diagnosis not present

## 2017-05-12 DIAGNOSIS — Z7901 Long term (current) use of anticoagulants: Secondary | ICD-10-CM | POA: Diagnosis not present

## 2017-05-12 DIAGNOSIS — K219 Gastro-esophageal reflux disease without esophagitis: Secondary | ICD-10-CM | POA: Diagnosis not present

## 2017-05-12 DIAGNOSIS — E785 Hyperlipidemia, unspecified: Secondary | ICD-10-CM | POA: Diagnosis not present

## 2017-05-12 DIAGNOSIS — E1165 Type 2 diabetes mellitus with hyperglycemia: Secondary | ICD-10-CM | POA: Diagnosis not present

## 2017-05-12 DIAGNOSIS — G8929 Other chronic pain: Secondary | ICD-10-CM | POA: Diagnosis not present

## 2017-05-12 DIAGNOSIS — Z888 Allergy status to other drugs, medicaments and biological substances status: Secondary | ICD-10-CM | POA: Diagnosis not present

## 2017-05-12 DIAGNOSIS — M170 Bilateral primary osteoarthritis of knee: Secondary | ICD-10-CM | POA: Diagnosis not present

## 2017-05-12 DIAGNOSIS — J45909 Unspecified asthma, uncomplicated: Secondary | ICD-10-CM | POA: Diagnosis not present

## 2017-05-12 DIAGNOSIS — Z7984 Long term (current) use of oral hypoglycemic drugs: Secondary | ICD-10-CM | POA: Diagnosis not present

## 2017-05-12 DIAGNOSIS — M25562 Pain in left knee: Secondary | ICD-10-CM | POA: Diagnosis not present

## 2017-05-12 DIAGNOSIS — I130 Hypertensive heart and chronic kidney disease with heart failure and stage 1 through stage 4 chronic kidney disease, or unspecified chronic kidney disease: Secondary | ICD-10-CM | POA: Diagnosis not present

## 2017-05-12 DIAGNOSIS — Z794 Long term (current) use of insulin: Secondary | ICD-10-CM | POA: Diagnosis not present

## 2017-05-12 DIAGNOSIS — Z880 Allergy status to penicillin: Secondary | ICD-10-CM | POA: Diagnosis not present

## 2017-05-12 DIAGNOSIS — E039 Hypothyroidism, unspecified: Secondary | ICD-10-CM | POA: Diagnosis not present

## 2017-05-12 DIAGNOSIS — M1711 Unilateral primary osteoarthritis, right knee: Secondary | ICD-10-CM | POA: Diagnosis not present

## 2017-05-12 DIAGNOSIS — G4733 Obstructive sleep apnea (adult) (pediatric): Secondary | ICD-10-CM | POA: Diagnosis not present

## 2017-05-12 DIAGNOSIS — G2581 Restless legs syndrome: Secondary | ICD-10-CM | POA: Diagnosis not present

## 2017-05-15 DIAGNOSIS — Z794 Long term (current) use of insulin: Secondary | ICD-10-CM | POA: Diagnosis not present

## 2017-05-15 DIAGNOSIS — E1165 Type 2 diabetes mellitus with hyperglycemia: Secondary | ICD-10-CM | POA: Diagnosis not present

## 2017-05-15 DIAGNOSIS — E1142 Type 2 diabetes mellitus with diabetic polyneuropathy: Secondary | ICD-10-CM | POA: Diagnosis not present

## 2017-05-25 ENCOUNTER — Ambulatory Visit (INDEPENDENT_AMBULATORY_CARE_PROVIDER_SITE_OTHER): Payer: Medicare Other | Admitting: Acute Care

## 2017-05-25 ENCOUNTER — Encounter: Payer: Self-pay | Admitting: Acute Care

## 2017-05-25 DIAGNOSIS — J8489 Other specified interstitial pulmonary diseases: Secondary | ICD-10-CM | POA: Diagnosis not present

## 2017-05-25 DIAGNOSIS — Z9989 Dependence on other enabling machines and devices: Secondary | ICD-10-CM

## 2017-05-25 DIAGNOSIS — G4733 Obstructive sleep apnea (adult) (pediatric): Secondary | ICD-10-CM

## 2017-05-25 NOTE — Assessment & Plan Note (Signed)
Patient requesting results of CPAP titration. Unable to find any documentation CPAP titration in Epic record. Notation of cancellation of CPAP titration appointment on 01/25/2017, 02/17/2017, and 03/14/2017. Plan We will continue researching the results of the CPAP titration that he state you're sure you had.  Continue wearing the machine as you have been doing. Once we get CPAP titration results we can order the new machine to ensure it is set at the optimal parameter to treat her sleep apnea. Continue on CPAP at bedtime.  Goal is to wear for at least 6 hours each night for maximal clinical benefit. Continue to work on weight loss, as the link between excess weight  and sleep apnea is well established.  Do not drive if sleepy. Remember to clean mask, tubing, filter, and reservoir once weekly with soapy water.  Follow up with Dr. Halford Chessman  In 6 months  or before as needed.  Please contact office for sooner follow up if symptoms do not improve or worsen or seek emergency care

## 2017-05-25 NOTE — Assessment & Plan Note (Signed)
Patient presents today requesting to stop his prednisone so that he can have a procedure done on his knee. He has been told procedure cannot be done unless his prednisone has been stopped, and his blood sugars are under control. Plan Patient has been on chronic prednisone 10 mg daily for many many years. We will be unable to entirely discontinue his prednisone for fear of adrenal insufficiency. I spent some time educating patient regarding the risk of adrenal insufficiency. Plan We will decrease your prednisone to 5 mg a day. Please follow up with [place] if you have any change in your breathing after decreasing the dose. Have Dr. Iran Planas adjust your insulin pump to get blood sugars within range for procedure at [facility]. Once you have had procedure done and have healed please return for evaluation to resume prednisone at 10 mg daily. Follow up with Dr. Halford Chessman in 6 months. Please contact office for sooner follow up if symptoms do not improve or worsen or seek emergency care

## 2017-05-25 NOTE — Progress Notes (Signed)
History of Present Illness Jon [name] is a 66 y.o. male former smoke with COPD, OSA, A fib, BOOP on chronic prednisone and oxygen.He is followed by Dr. Halford Chessman.   05/25/2017  Pt.  presents today stating that he needs to get a procedure done for his leg pain and that he was told he would have to come off his prednisone to get his blood sugars down before this could be done. Jon [name] has been on chronic prednisone for many years. It is doubtful he will be able to come off without the risk or adrenal insufficiency. I have tried to explain this to the patient who just wants to have the nerves in his knee deadened for pain relief. I explained he cannot stop taking his prednisone completely. He states his COPD/ BOOP  is stable on the current dose of prednisone. He denies fever, chest pain, orthopnea or hemoptysis. He is using his nebulizer treatments about every other day. He is compliant with Advair daily,Flonase and Protonix. He is unable to afford his Anoro. He is asking about his sleep study titration test. He insists  he has had the titration study done, but there are no results in EPIC. He is angry about this today in the office.  Test Results:  Serology 01/19/14 >> all negative VATS lung bx 01/24/14 >> organizing pneumonia Spirometry 06/11/15 >> FEV1 1.27 (39%), FEV1% 67 CT chest 08/08/15 >> ASD RLL CT chest 10/16/15 >> minimal Lt pleural thickening, improved aeration RUL and RLL, persistent b/l lower lobe recticulo-nodular opacities, dilated esophagus with distal wall thickening  Cardiac tests Echo 01/31/14 >> EF 55 to 60%, mod LA and severe RA dilation  Sleep tests PSG 12/14/16 >> AHI 21.8, SpO2 82%   CBC Latest Ref Rng & Units 07/24/2015 11/29/2014 02/07/2014  WBC 4.0 - 10.5 K/uL 14.3(H) 13.4(H) 15.5(H)  Hemoglobin 13.0 - 17.0 g/dL 11.8(L) 14.2 13.2  Hematocrit 39.0 - 52.0 % 35.7(L) 42.2 40.3  Platelets 150.0 - 400.0 K/uL 268.0 239 390    BMP Latest Ref Rng & Units 11/29/2014  11/29/2014 02/07/2014  Glucose 70 - 99 mg/dL - 146(H) 233(H)  BUN 6 - 23 mg/dL - 29(H) 12  Creatinine 0.50 - 1.35 mg/dL - 1.66(H) 0.98  Sodium 135 - 145 mmol/L - 137 137  Potassium 3.5 - 5.1 mmol/L 4.5 6.2(HH) 5.0  Chloride 96 - 112 mmol/L - 101 98  CO2 19 - 32 mmol/L - 24 22  Calcium 8.4 - 10.5 mg/dL - 9.3 9.4    BNP    Component Value Date/Time   BNP 66.7 11/29/2014 1410    ProBNP    Component Value Date/Time   PROBNP 627.2 (H) 01/18/2014 2150      Past medical hx Past Medical History:  Diagnosis Date  . Abnormal EKG 11/29/2014  . Acute renal insufficiency 11/29/2014  . Angina   . Arthritis    "knees" (01/18/2014)  . Asthma   . Atrial fibrillation ([place])   . Back pain 01/25/2015  . 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 BOOP (bronchiolitis obliterans with organizing pneumonia) ([place]) 01/20/2014   OLBx 01/20/14 BOOP Started steroids 02/28/14   . Chronic anticoagulation-Xarelto 02/09/2014  . Chronic atrial fibrillation ([place]) 01/03/2014  . Chronic bronchitis ([place]) 12/21/2013  . Chronic diastolic heart failure ([place]) 02/22/2015  . Chronic pain of both knees 03/18/2017   Overview:  Added automatically from request for surgery [id number]  . Chronic respiratory failure ([place]) 04/10/2015  . COPD (chronic obstructive pulmonary disease) ([place]) 01/25/2015  .  Cough    coughing up blood- started last nite, seems to be worse now  . CVA (cerebral vascular accident) ([place]) 02/09/2014  . Dysrhythmia    atrial fib takes cardizem,   . Fibromyalgia   . GERD (gastroesophageal reflux disease)   . H/O hiatal hernia   . Hemoptysis 08/01/2015  . Herpes zoster 06/26/2015  . History of stroke   . Hyperkalemia-repeat pending 11/29/2014  . Hyperlipemia   . Hyperlipidemia   . Hypertension   . Hypothyroidism   . IDDM (insulin dependent diabetes mellitus) (HCC)    insulin pump   . Long term (current) use of anticoagulants 03/21/2014  . Mild CAD 02/20/2015   Overview:  On recent cardiac cath  Overview:  On recent  cardiac cath  . Myocardial infarction [facility])    "one dr says yes; another says no"  . Neuromuscular disorder (HCC)    rls, neuropathy  . Neuropathy    rls, neuropathy   . Normal coronary arteries 2011 11/29/2014  . Obesity (BMI 30-39.9)   . On home oxygen therapy    "2L w/CPAP at bedtime" (01/18/2014)  . OSA on CPAP    cpap & oxygen  . Pericardial effusion   . Peritoneal effusion, chronic 02/22/2015   Overview:  With surgical window  . PNA (pneumonia) 04/10/2015  . Pneumonia 5/15   "several times" (01/18/2014)  . Precordial pain 01/26/2015  . Preoperative cardiovascular examination 02/24/2016  . Primary osteoarthritis of both knees 03/18/2017   Overview:  Added automatically from request for surgery [id number]  . Restless leg syndrome   . Sleep apnea    cpap & oxygen   . Stroke [facility]) 2012   denies residual on 01/18/2014  . Troponin level elevated 11/29/2014  . Uncontrolled type 2 diabetes mellitus with diabetic polyneuropathy, with long-term current use of insulin ([place]) 12/21/2013     Social History  Substance Use Topics  . Smoking status: Former Smoker    Types: Cigars    Quit date: 09/01/2006  . Smokeless tobacco: Never Used  . 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 His smoking use included Cigars. He has never used smokeless tobacco. He reports that he does not drink alcohol or use drugs.  Tobacco Cessation: Former smoker quit 2008, no pack year history information in EPIC.  Past surgical hx, Family hx, Social hx all reviewed.  Current Outpatient Prescriptions on File Prior to Visit  Medication Sig  . albuterol (PROVENTIL HFA;VENTOLIN HFA) 108 (90 BASE) MCG/ACT inhaler Inhale 2 puffs into the lungs every 6 (six) hours as needed for wheezing or shortness of breath.  Marland Kitchen albuterol (PROVENTIL) (2.5 MG/3ML) 0.083% nebulizer solution Take 3 mLs (2.5 mg total) by nebulization 2 (two) times  daily. And as needed  . atorvastatin (LIPITOR) 40 MG tablet Take 40 mg by mouth daily.  Marland Kitchen azelastine (ASTELIN) 137 MCG/SPRAY nasal spray Place 1 spray into both nostrils at bedtime as needed for allergies.   . Calcium Carbonate-Vitamin D 600-400 MG-UNIT tablet Take 1 tablet by mouth 2 (two) times daily.  Marland Kitchen diltiazem (CARDIZEM) 120 MG tablet Take 120 mg by mouth daily.  . famotidine (PEPCID) 20 MG tablet TAKE 1 TABLET BY MOUTH AT BEDTIME  . ferrous sulfate 325 (65 FE) MG tablet Take 325 mg by mouth daily.  . fluticasone (FLONASE) 50 MCG/ACT nasal spray Place 1 spray into both nostrils  daily as needed for allergies.   . Fluticasone-Salmeterol (ADVAIR) 500-50 MCG/DOSE AEPB Inhale 1 puff into the lungs every 12 (twelve) hours.  . Insulin Human (INSULIN PUMP) SOLN Inject 1 each into the skin continuous. Inject 0.8 units of novolog every hour per insulin pump.  Dose Increased due to chronic steroid use  . insulin regular human CONCENTRATED (HUMULIN R) 500 UNIT/ML injection Use up to 0.3 mL in insulin pump daily.  MDD=0.3 mL equivalent to 150 units of regular insulin  . isosorbide mononitrate (IMDUR) 60 MG 24 hr tablet Take 60 mg by mouth every morning.  . latanoprost (XALATAN) 0.005 % ophthalmic solution Place 1 drop into both eyes at bedtime.  . metolazone (ZAROXOLYN) 2.5 MG tablet Take 1 tablet (2.5 mg total) by mouth 2 (two) times a week.  Marland Kitchen MOVANTIK 25 MG TABS Take 25 mg by mouth daily.   . Multiple Vitamins-Minerals (CENTRUM SILVER PO) Take 1 tablet by mouth daily.  . nitroGLYCERIN (NITROSTAT) 0.4 MG SL tablet Place 0.4 mg under the tongue every 5 (five) minutes as needed. For chest pain  . OXYCONTIN 20 MG 12 hr tablet Take 20 mg by mouth every 12 (twelve) hours.   . OXYGEN Inhale into the lungs. 2 liters at bedtime  . pantoprazole (PROTONIX) 40 MG tablet Take 40 mg by mouth 2 (two) times daily.  Vladimir Faster Glycol-Propyl Glycol (SYSTANE PRESERVATIVE FREE) 0.4-0.3 % SOLN Apply 1 drop to eye 4  (four) times daily as needed (dryness of eye).   . Potassium Chloride ER 20 MEQ TBCR Take 20 mEq by mouth 3 (three) times daily.  . predniSONE (DELTASONE) 10 MG tablet 1.5 pills daiy for 2 weeks, then 1 pill daily (Patient taking differently: Take 10 mg by mouth daily. )  . pregabalin (LYRICA) 75 MG capsule Take 1 capsule (75 mg total) by mouth 2 (two) times daily.  Marland Kitchen torsemide (DEMADEX) 20 MG tablet Take 20 mg by mouth 3 (three) times daily.   Marland Kitchen umeclidinium-vilanterol (ANORO ELLIPTA) 62.5-25 MCG/INH AEPB Inhale 1 puff into the lungs daily.  Alveda Reasons 15 MG TABS tablet TAKE 1 TABLET BY MOUTH ONCE DAILY WITH EVENING MEAL   No current facility-administered medications on file prior to visit.      Allergies  Allergen Reactions  . Hydrocodone Itching  . Morphine Itching  . Duloxetine Hcl Other (See Comments)  . Codeine Itching  . Penicillins Rash    Review Of Systems:  Constitutional:   No  weight loss, night sweats,  Fevers, chills, fatigue, or  lassitude.  HEENT:   No headaches,  Difficulty swallowing,  Tooth/dental problems, or  Sore throat,                No sneezing, itching, ear ache, nasal congestion, post nasal drip,   CV:  No chest pain,  Orthopnea, PND, +swelling in lower extremities, no anasarca, dizziness, palpitations, syncope.   GI  No heartburn, indigestion, abdominal pain, nausea, vomiting, diarrhea, change in bowel habits, loss of appetite, bloody stools.   Resp: + shortness of breath with exertion or at rest.  No excess mucus, no productive cough,  No non-productive cough,  No coughing up of blood.  No change in color of mucus.  No wheezing.  No chest wall deformity  Skin: no rash or lesions.  GU: no dysuria, change in color of urine, no urgency or frequency.  No flank pain, no hematuria   MS:  + joint pain ( knee) or  swelling.  +  decreased range of motion ( knee).  No back pain.  Psych:  No change in mood or affect. No depression or anxiety.  No memory  loss.   Vital Signs BP 138/78 (BP Location: Left Arm, Cuff Size: Normal)   Pulse 68   Ht 5\' 7"  (1.702 m)   Wt 283 lb 4 oz (128.5 kg)   SpO2 95%   BMI 44.36 kg/m    Physical Exam:  General- No distress,  A&Ox3, unpleasant ENT: No sinus tenderness, TM clear, pale nasal mucosa, no oral exudate,no post nasal drip, no LAN Cardiac: S1, S2, regular rate and rhythm, no murmur Chest: No wheeze/ rales/ dullness; no accessory muscle use, no nasal flaring, no sternal retractions, diminished bilaterally in the bases Abd.: Soft Non-tender, obese Ext: No clubbing cyanosis, 1+ Bilateral LE edema Neuro:  Deconditioned at baseline, cranial nerves intact. Skin: No rashes, warm and dry Psych: normal mood and behavior   Assessment/Plan  BOOP (bronchiolitis obliterans with organizing pneumonia) Patient presents today requesting to stop his prednisone so that he can have a procedure done on his knee. He has been told procedure cannot be done unless his prednisone has been stopped, and his blood sugars are under control. Plan Patient has been on chronic prednisone 10 mg daily for many many years. We will be unable to entirely discontinue his prednisone for fear of adrenal insufficiency. I spent some time educating patient regarding the risk of adrenal insufficiency. Plan We will decrease your prednisone to 5 mg a day. Please follow up with [place] if you have any change in your breathing after decreasing the dose. Have Dr. Iran Planas adjust your insulin pump to get blood sugars within range for procedure at [facility]. Once you have had procedure done and have healed please return for evaluation to resume prednisone at 10 mg daily. Follow up with Dr. Halford Chessman in 6 months. Please contact office for sooner follow up if symptoms do not improve or worsen or seek emergency care     OSA on CPAP Patient requesting results of CPAP titration. Unable to find any documentation CPAP titration in Epic  record. Notation of cancellation of CPAP titration appointment on 01/25/2017, 02/17/2017, and 03/14/2017. Plan We will continue researching the results of the CPAP titration that he state you're sure you had.  Continue wearing the machine as you have been doing. Once we get CPAP titration results we can order the new machine to ensure it is set at the optimal parameter to treat her sleep apnea. Continue on CPAP at bedtime.  Goal is to wear for at least 6 hours each night for maximal clinical benefit. Continue to work on weight loss, as the link between excess weight  and sleep apnea is well established.  Do not drive if sleepy. Remember to clean mask, tubing, filter, and reservoir once weekly with soapy water.  Follow up with Dr. Halford Chessman  In 6 months  or before as needed.  Please contact office for sooner follow up if symptoms do not improve or worsen or seek emergency care     Magdalen Spatz, NP 05/25/2017  1:46 PM

## 2017-05-25 NOTE — Patient Instructions (Addendum)
We will decrease your prednisone to 5 mg a day. Please follow up with [place] if you have any change in your breathing after decreasing the dose. Have Dr. Iran Planas adjust your insulin pump to get blood sugars within range for procedure at [facility]. We will follow up on your CPAP Titration Study. We will call you with results and recommendations. Once you have had procedure done and have healed please return for evaluation to resume prednisone at 10 mg daily. Continue using your Advair and albuterol nebs. We will give you a sample of Anoro today. Follow up with Dr. Halford Chessman in 6 months. Please contact office for sooner follow up if symptoms do not improve or worsen or seek emergency care

## 2017-05-26 NOTE — Progress Notes (Signed)
I have reviewed and agree with assessment/plan.  [name], MD [facility] Pulmonary/Critical Care 05/26/2017, 8:56 AM Pager:  [phone]

## 2017-05-29 DIAGNOSIS — E1121 Type 2 diabetes mellitus with diabetic nephropathy: Secondary | ICD-10-CM | POA: Diagnosis not present

## 2017-05-29 DIAGNOSIS — I482 Chronic atrial fibrillation: Secondary | ICD-10-CM | POA: Diagnosis not present

## 2017-05-29 DIAGNOSIS — I5032 Chronic diastolic (congestive) heart failure: Secondary | ICD-10-CM | POA: Diagnosis not present

## 2017-05-29 DIAGNOSIS — M1711 Unilateral primary osteoarthritis, right knee: Secondary | ICD-10-CM | POA: Diagnosis not present

## 2017-05-29 DIAGNOSIS — I10 Essential (primary) hypertension: Secondary | ICD-10-CM | POA: Diagnosis not present

## 2017-05-29 DIAGNOSIS — Z23 Encounter for immunization: Secondary | ICD-10-CM | POA: Diagnosis not present

## 2017-05-29 DIAGNOSIS — R600 Localized edema: Secondary | ICD-10-CM | POA: Diagnosis not present

## 2017-05-29 DIAGNOSIS — E782 Mixed hyperlipidemia: Secondary | ICD-10-CM | POA: Diagnosis not present

## 2017-05-29 DIAGNOSIS — I119 Hypertensive heart disease without heart failure: Secondary | ICD-10-CM | POA: Diagnosis not present

## 2017-06-22 DIAGNOSIS — M170 Bilateral primary osteoarthritis of knee: Secondary | ICD-10-CM | POA: Diagnosis not present

## 2017-06-22 DIAGNOSIS — M25562 Pain in left knee: Secondary | ICD-10-CM | POA: Diagnosis not present

## 2017-06-22 DIAGNOSIS — G8929 Other chronic pain: Secondary | ICD-10-CM | POA: Diagnosis not present

## 2017-06-22 DIAGNOSIS — M25561 Pain in right knee: Secondary | ICD-10-CM | POA: Diagnosis not present

## 2017-06-22 DIAGNOSIS — G894 Chronic pain syndrome: Secondary | ICD-10-CM | POA: Diagnosis not present

## 2017-06-23 IMAGING — CR DG CHEST 2V
2 series · 2 of 2 positions shown · non-contrast
Comparison: 04/10/2015

CLINICAL DATA: Followup of pneumonia. Cough congestion.
Hypertension. Asthma.

EXAM:
CHEST  2 VIEW

[view not recorded (1 of 2)]
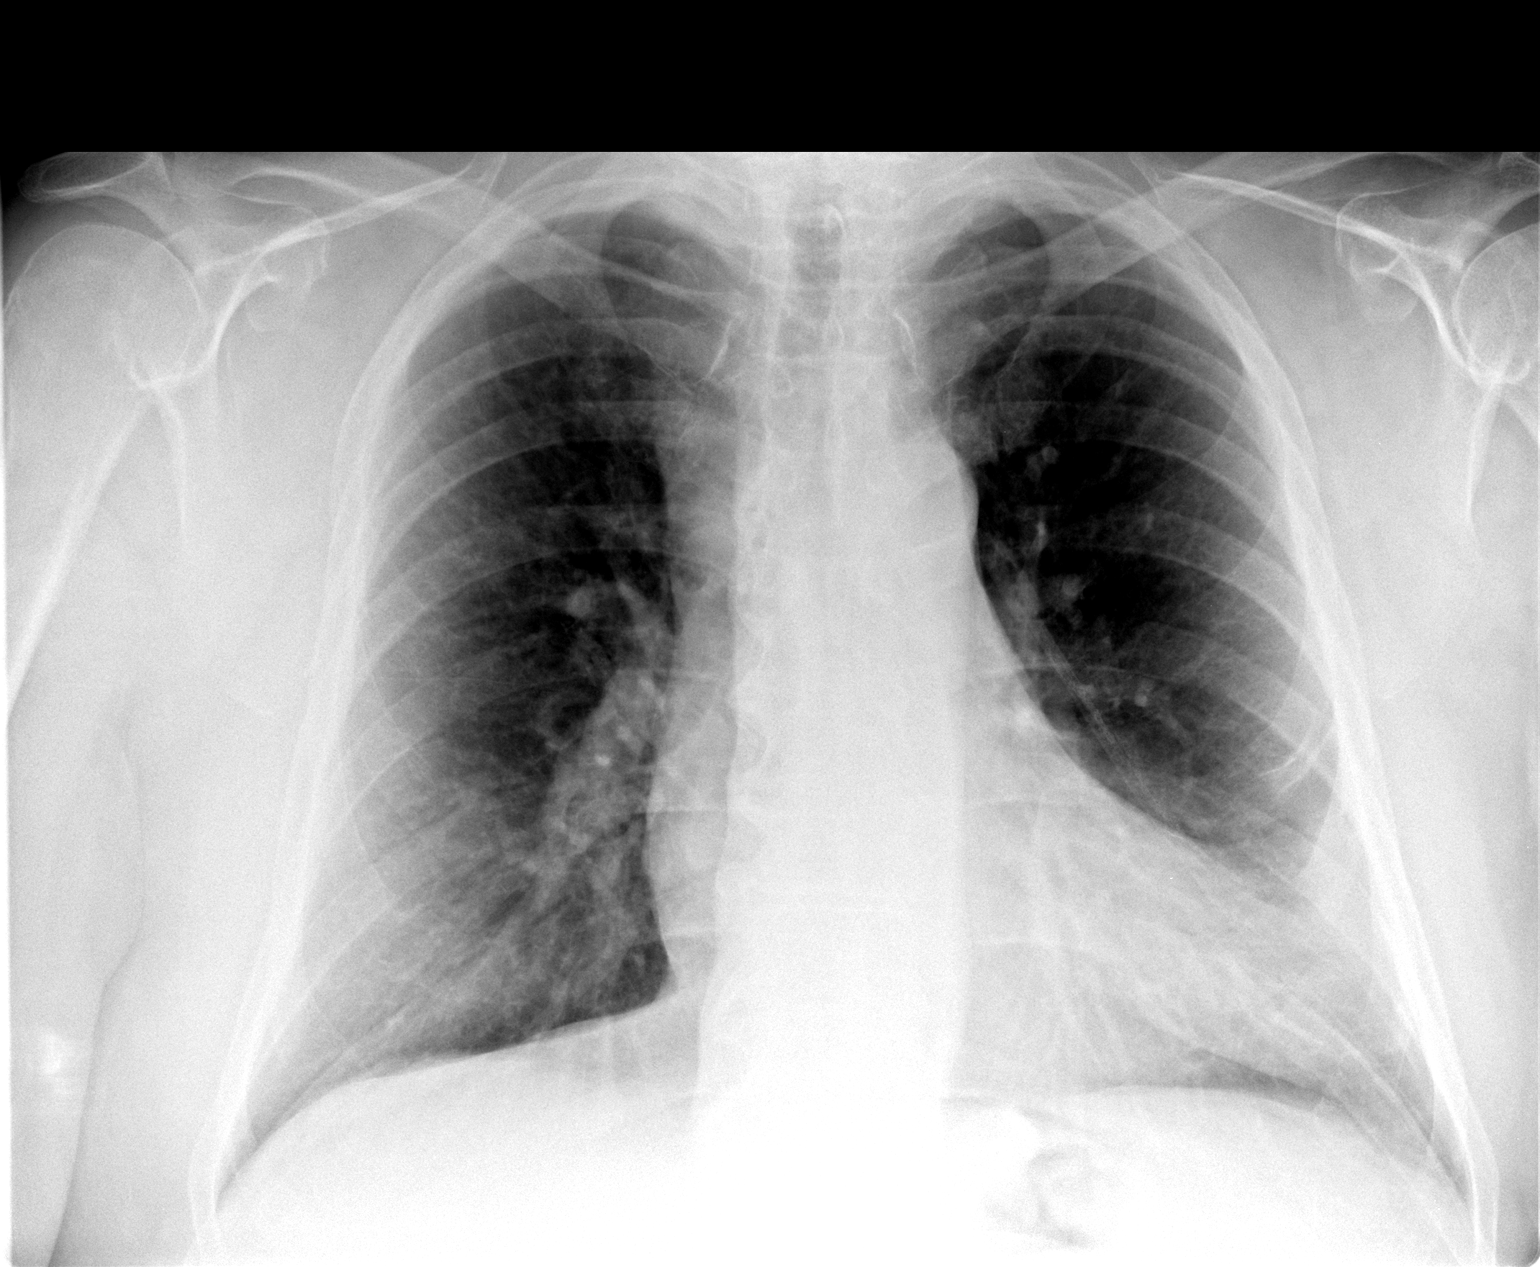

[view not recorded (2 of 2)]
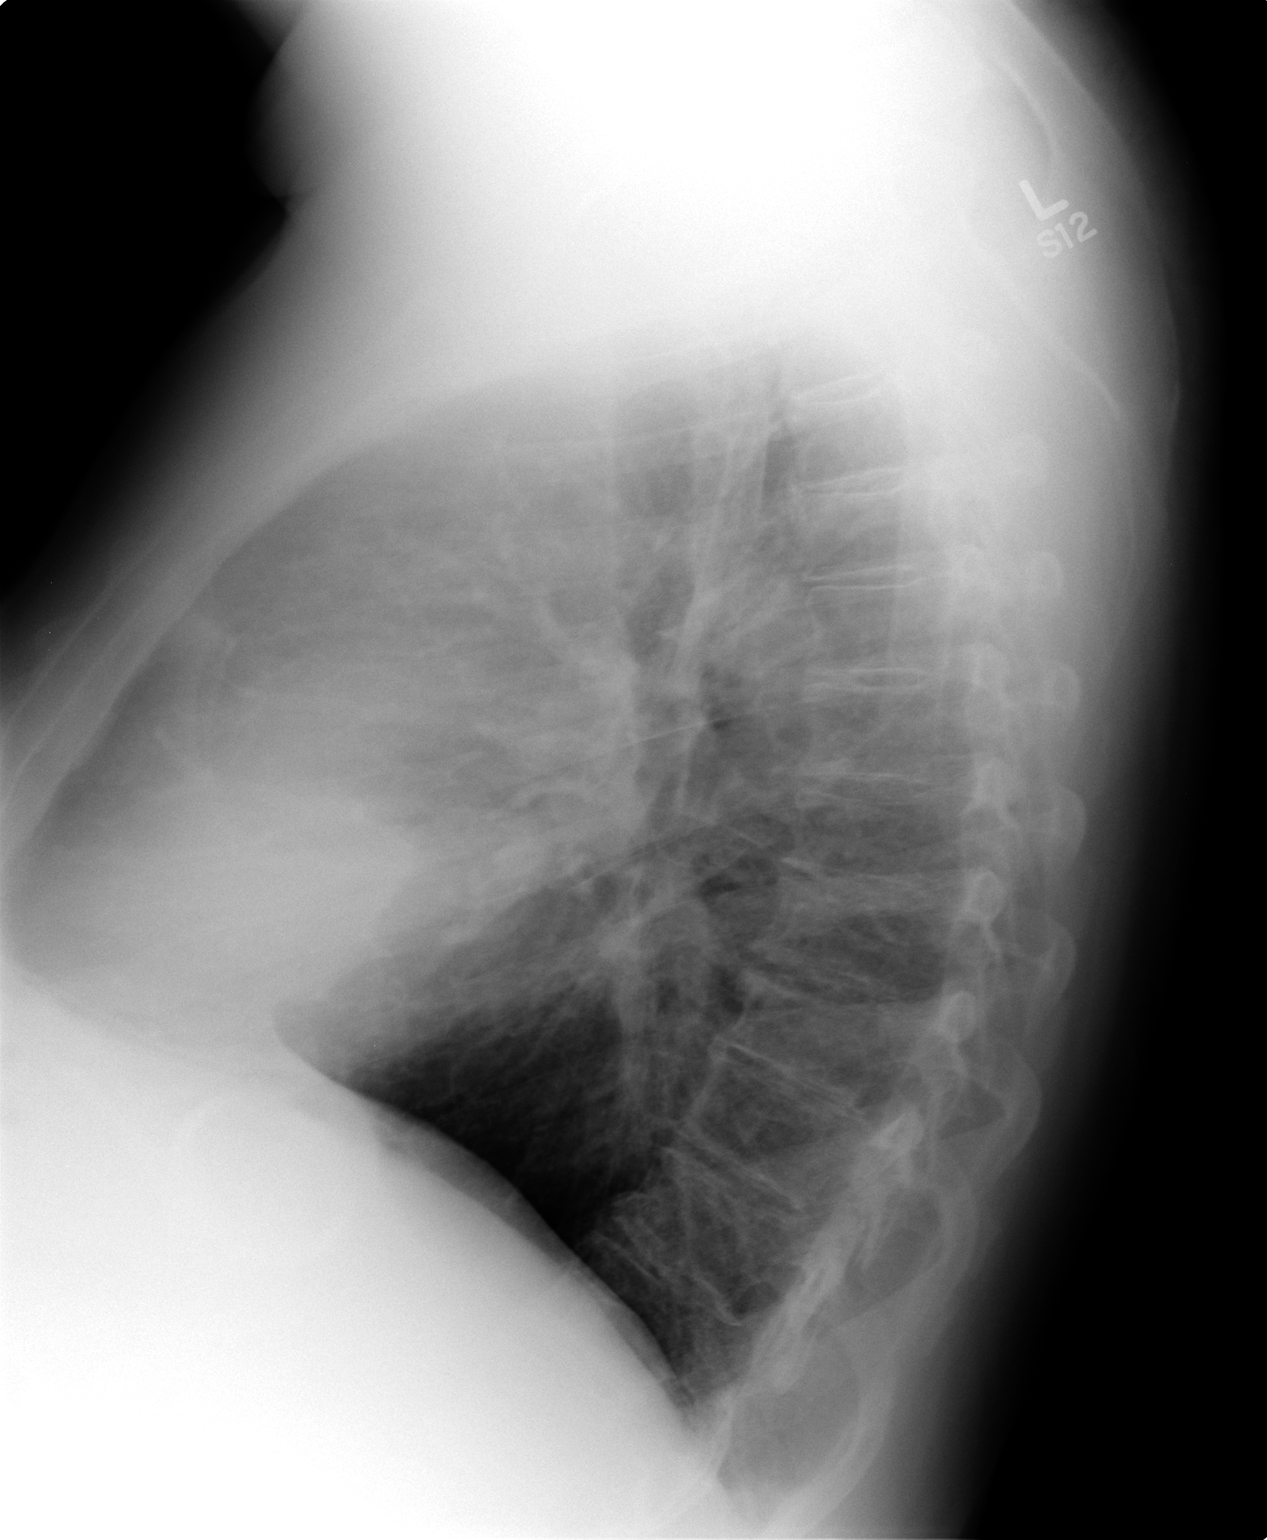

[2 of 2 positions shown; findings below may reference images not displayed]

FINDINGS: Mild hyperinflation. Midline trachea. Mild cardiomegaly. Mediastinal
contours otherwise within normal limits. No pleural effusion or
pneumothorax. No residual consolidation. Diffuse peribronchial
thickening.
IMPRESSION: Cardiomegaly and hyperinflation suggesting COPD. No residual
pneumonia identified.

## 2017-06-30 DIAGNOSIS — E119 Type 2 diabetes mellitus without complications: Secondary | ICD-10-CM | POA: Diagnosis not present

## 2017-06-30 DIAGNOSIS — I509 Heart failure, unspecified: Secondary | ICD-10-CM | POA: Diagnosis not present

## 2017-06-30 DIAGNOSIS — I110 Hypertensive heart disease with heart failure: Secondary | ICD-10-CM | POA: Diagnosis not present

## 2017-06-30 DIAGNOSIS — I4891 Unspecified atrial fibrillation: Secondary | ICD-10-CM | POA: Diagnosis not present

## 2017-06-30 DIAGNOSIS — L97822 Non-pressure chronic ulcer of other part of left lower leg with fat layer exposed: Secondary | ICD-10-CM | POA: Diagnosis not present

## 2017-06-30 DIAGNOSIS — I2510 Atherosclerotic heart disease of native coronary artery without angina pectoris: Secondary | ICD-10-CM | POA: Diagnosis not present

## 2017-06-30 DIAGNOSIS — I739 Peripheral vascular disease, unspecified: Secondary | ICD-10-CM | POA: Diagnosis not present

## 2017-06-30 DIAGNOSIS — Z87891 Personal history of nicotine dependence: Secondary | ICD-10-CM | POA: Diagnosis not present

## 2017-06-30 DIAGNOSIS — M1990 Unspecified osteoarthritis, unspecified site: Secondary | ICD-10-CM | POA: Diagnosis not present

## 2017-06-30 DIAGNOSIS — I87312 Chronic venous hypertension (idiopathic) with ulcer of left lower extremity: Secondary | ICD-10-CM | POA: Diagnosis not present

## 2017-07-07 DIAGNOSIS — I87312 Chronic venous hypertension (idiopathic) with ulcer of left lower extremity: Secondary | ICD-10-CM | POA: Diagnosis not present

## 2017-07-07 DIAGNOSIS — L97822 Non-pressure chronic ulcer of other part of left lower leg with fat layer exposed: Secondary | ICD-10-CM | POA: Diagnosis not present

## 2017-07-07 DIAGNOSIS — I872 Venous insufficiency (chronic) (peripheral): Secondary | ICD-10-CM | POA: Diagnosis not present

## 2017-07-07 NOTE — Progress Notes (Deleted)
Cardiology Office Note:    Date:  07/07/2017   ID:  Jon [name], DOB [birth year], MRN [id number]  PCP:  Rochel Brome, MD  Cardiologist:  Shirlee More, MD    Referring MD: Rochel Brome, MD    ASSESSMENT:    No diagnosis found. PLAN:    In order of problems listed above:  1. ***   Next appointment: ***   Medication Adjustments/Labs and Tests Ordered: Current medicines are reviewed at length with the patient today.  Concerns regarding medicines are outlined above.  No orders of the defined types were placed in this encounter.  No orders of the defined types were placed in this encounter.   No chief complaint on file.   History of Present Illness:    Jon [name] is a 66 y.o. male with a hx of CAD, CHF, Atrial Fibrillation, HTN, CVA, COPD and sleep apnea last seen in February 2018 after [facility] admission with decompensated COPD and heart failure. last seen 3 months ago.. Compliance with diet, lifestyle and medications: *** Past Medical History:  Diagnosis Date  . Abnormal EKG 11/29/2014  . Acute renal insufficiency 11/29/2014  . Angina   . Arthritis    "knees" (01/18/2014)  . Asthma   . Atrial fibrillation ([place])   . Back pain 01/25/2015  . 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 BOOP (bronchiolitis obliterans with organizing pneumonia) ([place]) 01/20/2014   OLBx 01/20/14 BOOP Started steroids 02/28/14   . Chronic anticoagulation-Xarelto 02/09/2014  . Chronic atrial fibrillation ([place]) 01/03/2014  . Chronic bronchitis ([place]) 12/21/2013  . Chronic diastolic heart failure ([place]) 02/22/2015  . Chronic pain of both knees 03/18/2017   Overview:  Added automatically from request for surgery [id number]  . Chronic respiratory failure ([place]) 04/10/2015  . COPD (chronic obstructive pulmonary disease) ([place]) 01/25/2015  . Cough    coughing up blood- started last nite, seems to be worse now  . CVA (cerebral vascular accident) ([place]) 02/09/2014  . Dysrhythmia    atrial fib takes cardizem,   .  Fibromyalgia   . GERD (gastroesophageal reflux disease)   . H/O hiatal hernia   . Hemoptysis 08/01/2015  . Herpes zoster 06/26/2015  . History of stroke   . Hyperkalemia-repeat pending 11/29/2014  . Hyperlipemia   . Hyperlipidemia   . Hypertension   . Hypothyroidism   . IDDM (insulin dependent diabetes mellitus) (HCC)    insulin pump   . Long term (current) use of anticoagulants 03/21/2014  . Mild CAD 02/20/2015   Overview:  On recent cardiac cath  Overview:  On recent cardiac cath  . Myocardial infarction [facility])    "one dr says yes; another says no"  . Neuromuscular disorder (HCC)    rls, neuropathy  . Neuropathy    rls, neuropathy   . Normal coronary arteries 2011 11/29/2014  . Obesity (BMI 30-39.9)   . On home oxygen therapy    "2L w/CPAP at bedtime" (01/18/2014)  . OSA on CPAP    cpap & oxygen  . Pericardial effusion   . Peritoneal effusion, chronic 02/22/2015   Overview:  With surgical window  . PNA (pneumonia) 04/10/2015  . Pneumonia 5/15   "several times" (01/18/2014)  . Precordial pain 01/26/2015  . Preoperative cardiovascular examination 02/24/2016  . Primary osteoarthritis of both knees 03/18/2017   Overview:  Added automatically from request for surgery [id number]  . Restless leg syndrome   . Sleep apnea    cpap & oxygen   . Stroke [facility]) 2012  denies residual on 01/18/2014  . Troponin level elevated 11/29/2014  . Uncontrolled type 2 diabetes mellitus with diabetic polyneuropathy, with long-term current use of insulin ([place]) 12/21/2013    Past Surgical History:  Procedure Laterality Date  . APPENDECTOMY    . CARDIAC CATHETERIZATION  X 2 then 03/03/2012    NL LVF, normal coronaries, vessels are small ([facility]: Dr. Beatrix Fetters)  . CATARACT EXTRACTION W/ INTRAOCULAR LENS  IMPLANT, BILATERAL Bilateral   . CHOLECYSTECTOMY    . HERNIA REPAIR     UHR  . LAPAROSCOPIC GASTRIC BANDING  2010  . SINUS EXPLORATION  X 2  . UMBILICAL HERNIA REPAIR      Current Medications: No outpatient  medications have been marked as taking for the 07/08/17 encounter (Appointment) with Richardo Priest, MD.     Allergies:   Hydrocodone; Morphine; Duloxetine hcl; Codeine; and Penicillins   Social History   Socioeconomic History  . Marital status: Married    Spouse name: Not on file  . Number of children: Not on file  . Years of education: Not on file  . Highest education level: Not on file  Social Needs  . Financial resource strain: Not on file  . Food insecurity - worry: Not on file  . Food insecurity - inability: Not on file  . Transportation needs - medical: Not on file  . Transportation needs - non-medical: Not on file  Occupational History  . Not on file  Tobacco Use  . Smoking status: Former Smoker    Types: Cigars    Last attempt to quit: 09/01/2006    Years since quitting: 10.8  . Smokeless tobacco: Never Used  Substance and Sexual Activity  . 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 Drug use: No  . Sexual activity: No  Other Topics Concern  . Not on file  Social History Narrative  . Not on file     Family History: The patient's ***family history includes Cancer in his mother; Diabetes in his sister; Heart disease in his father; Seizures in his sister; Stroke in his father. There is no history of Anesthesia problems, Hypotension, Malignant hyperthermia, or Pseudochol deficiency. ROS:   Please see the history of present illness.    All other systems reviewed and are negative.  EKGs/Labs/Other Studies Reviewed:    The following studies were reviewed today:  EKG:  EKG ordered today.  The ekg ordered today demonstrates ***  Recent Labs: No results found for requested labs within last 8760 hours.  Recent Lipid Panel    Component Value Date/Time   CHOL 91 01/31/2014 0337   TRIG 69 01/31/2014 0337   HDL 31 (L) 01/31/2014 0337   CHOLHDL 2.9 01/31/2014 0337   VLDL 14 01/31/2014 0337   LDLCALC 46 01/31/2014 0337    Physical  Exam:    VS:  There were no vitals taken for this visit.    Wt Readings from Last 3 Encounters:  05/25/17 283 lb 4 oz (128.5 kg)  04/07/17 288 lb (130.6 kg)  02/17/17 238 lb (108 kg)     GEN: *** Well nourished, well developed in no acute distress HEENT: Normal NECK: No JVD; No carotid bruits LYMPHATICS: No lymphadenopathy CARDIAC: ***RRR, no murmurs, rubs, gallops RESPIRATORY:  Clear to auscultation without rales, wheezing or rhonchi  ABDOMEN: Soft, non-tender, non-distended MUSCULOSKELETAL:  No edema; No deformity  SKIN: Warm and dry NEUROLOGIC:  Alert and oriented x 3 PSYCHIATRIC:  Normal affect    Signed, Shirlee More, MD  07/07/2017 9:22 AM    [facility]

## 2017-07-08 ENCOUNTER — Ambulatory Visit: Payer: Medicare Other | Admitting: Cardiology

## 2017-07-15 DIAGNOSIS — L97822 Non-pressure chronic ulcer of other part of left lower leg with fat layer exposed: Secondary | ICD-10-CM | POA: Diagnosis not present

## 2017-07-15 DIAGNOSIS — I872 Venous insufficiency (chronic) (peripheral): Secondary | ICD-10-CM | POA: Diagnosis not present

## 2017-07-15 DIAGNOSIS — S81801A Unspecified open wound, right lower leg, initial encounter: Secondary | ICD-10-CM | POA: Diagnosis not present

## 2017-07-15 DIAGNOSIS — I87312 Chronic venous hypertension (idiopathic) with ulcer of left lower extremity: Secondary | ICD-10-CM | POA: Diagnosis not present

## 2017-07-16 DIAGNOSIS — Z7952 Long term (current) use of systemic steroids: Secondary | ICD-10-CM | POA: Diagnosis not present

## 2017-07-16 DIAGNOSIS — F329 Major depressive disorder, single episode, unspecified: Secondary | ICD-10-CM | POA: Diagnosis not present

## 2017-07-16 DIAGNOSIS — Z794 Long term (current) use of insulin: Secondary | ICD-10-CM | POA: Diagnosis not present

## 2017-07-16 DIAGNOSIS — N189 Chronic kidney disease, unspecified: Secondary | ICD-10-CM | POA: Diagnosis not present

## 2017-07-16 DIAGNOSIS — E039 Hypothyroidism, unspecified: Secondary | ICD-10-CM | POA: Diagnosis not present

## 2017-07-16 DIAGNOSIS — M25561 Pain in right knee: Secondary | ICD-10-CM | POA: Diagnosis not present

## 2017-07-16 DIAGNOSIS — M1711 Unilateral primary osteoarthritis, right knee: Secondary | ICD-10-CM | POA: Diagnosis not present

## 2017-07-16 DIAGNOSIS — Z9884 Bariatric surgery status: Secondary | ICD-10-CM | POA: Diagnosis not present

## 2017-07-16 DIAGNOSIS — E669 Obesity, unspecified: Secondary | ICD-10-CM | POA: Diagnosis not present

## 2017-07-16 DIAGNOSIS — Z885 Allergy status to narcotic agent status: Secondary | ICD-10-CM | POA: Diagnosis not present

## 2017-07-16 DIAGNOSIS — G894 Chronic pain syndrome: Secondary | ICD-10-CM | POA: Diagnosis not present

## 2017-07-16 DIAGNOSIS — J45909 Unspecified asthma, uncomplicated: Secondary | ICD-10-CM | POA: Diagnosis not present

## 2017-07-16 DIAGNOSIS — Z880 Allergy status to penicillin: Secondary | ICD-10-CM | POA: Diagnosis not present

## 2017-07-16 DIAGNOSIS — I509 Heart failure, unspecified: Secondary | ICD-10-CM | POA: Diagnosis not present

## 2017-07-16 DIAGNOSIS — G4733 Obstructive sleep apnea (adult) (pediatric): Secondary | ICD-10-CM | POA: Diagnosis not present

## 2017-07-16 DIAGNOSIS — M170 Bilateral primary osteoarthritis of knee: Secondary | ICD-10-CM | POA: Diagnosis not present

## 2017-07-16 DIAGNOSIS — Z7901 Long term (current) use of anticoagulants: Secondary | ICD-10-CM | POA: Diagnosis not present

## 2017-07-16 DIAGNOSIS — I130 Hypertensive heart and chronic kidney disease with heart failure and stage 1 through stage 4 chronic kidney disease, or unspecified chronic kidney disease: Secondary | ICD-10-CM | POA: Diagnosis not present

## 2017-07-16 DIAGNOSIS — E1122 Type 2 diabetes mellitus with diabetic chronic kidney disease: Secondary | ICD-10-CM | POA: Diagnosis not present

## 2017-07-16 DIAGNOSIS — I4891 Unspecified atrial fibrillation: Secondary | ICD-10-CM | POA: Diagnosis not present

## 2017-07-16 DIAGNOSIS — E785 Hyperlipidemia, unspecified: Secondary | ICD-10-CM | POA: Diagnosis not present

## 2017-07-16 DIAGNOSIS — M25562 Pain in left knee: Secondary | ICD-10-CM | POA: Diagnosis not present

## 2017-07-16 DIAGNOSIS — Z87891 Personal history of nicotine dependence: Secondary | ICD-10-CM | POA: Diagnosis not present

## 2017-07-16 DIAGNOSIS — Z79899 Other long term (current) drug therapy: Secondary | ICD-10-CM | POA: Diagnosis not present

## 2017-07-22 DIAGNOSIS — I872 Venous insufficiency (chronic) (peripheral): Secondary | ICD-10-CM | POA: Diagnosis not present

## 2017-07-22 DIAGNOSIS — I87313 Chronic venous hypertension (idiopathic) with ulcer of bilateral lower extremity: Secondary | ICD-10-CM | POA: Diagnosis not present

## 2017-07-22 DIAGNOSIS — L97812 Non-pressure chronic ulcer of other part of right lower leg with fat layer exposed: Secondary | ICD-10-CM | POA: Diagnosis not present

## 2017-07-22 DIAGNOSIS — L97822 Non-pressure chronic ulcer of other part of left lower leg with fat layer exposed: Secondary | ICD-10-CM | POA: Diagnosis not present

## 2017-07-22 DIAGNOSIS — S81801A Unspecified open wound, right lower leg, initial encounter: Secondary | ICD-10-CM | POA: Diagnosis not present

## 2017-07-22 DIAGNOSIS — E11622 Type 2 diabetes mellitus with other skin ulcer: Secondary | ICD-10-CM | POA: Diagnosis not present

## 2017-07-22 DIAGNOSIS — I87312 Chronic venous hypertension (idiopathic) with ulcer of left lower extremity: Secondary | ICD-10-CM | POA: Diagnosis not present

## 2017-07-24 DIAGNOSIS — L97829 Non-pressure chronic ulcer of other part of left lower leg with unspecified severity: Secondary | ICD-10-CM | POA: Diagnosis not present

## 2017-07-24 DIAGNOSIS — I87312 Chronic venous hypertension (idiopathic) with ulcer of left lower extremity: Secondary | ICD-10-CM | POA: Diagnosis not present

## 2017-07-24 DIAGNOSIS — E11622 Type 2 diabetes mellitus with other skin ulcer: Secondary | ICD-10-CM | POA: Diagnosis not present

## 2017-07-28 DIAGNOSIS — I87312 Chronic venous hypertension (idiopathic) with ulcer of left lower extremity: Secondary | ICD-10-CM | POA: Diagnosis not present

## 2017-07-28 DIAGNOSIS — L97822 Non-pressure chronic ulcer of other part of left lower leg with fat layer exposed: Secondary | ICD-10-CM | POA: Diagnosis not present

## 2017-07-28 DIAGNOSIS — S81801A Unspecified open wound, right lower leg, initial encounter: Secondary | ICD-10-CM | POA: Diagnosis not present

## 2017-07-28 DIAGNOSIS — L97812 Non-pressure chronic ulcer of other part of right lower leg with fat layer exposed: Secondary | ICD-10-CM | POA: Diagnosis not present

## 2017-07-28 DIAGNOSIS — E11622 Type 2 diabetes mellitus with other skin ulcer: Secondary | ICD-10-CM | POA: Diagnosis not present

## 2017-07-30 DIAGNOSIS — G894 Chronic pain syndrome: Secondary | ICD-10-CM | POA: Diagnosis not present

## 2017-07-30 DIAGNOSIS — M170 Bilateral primary osteoarthritis of knee: Secondary | ICD-10-CM | POA: Diagnosis not present

## 2017-07-30 DIAGNOSIS — G8929 Other chronic pain: Secondary | ICD-10-CM | POA: Diagnosis not present

## 2017-07-30 DIAGNOSIS — M25562 Pain in left knee: Secondary | ICD-10-CM | POA: Diagnosis not present

## 2017-07-30 DIAGNOSIS — M25561 Pain in right knee: Secondary | ICD-10-CM | POA: Diagnosis not present

## 2017-07-31 ENCOUNTER — Other Ambulatory Visit: Payer: Self-pay | Admitting: *Deleted

## 2017-07-31 NOTE — Patient Outreach (Addendum)
[facility]) Care Management  07/31/2017  Jon [name] February 10, 1951 [id number]  Telephone Screen  Referral Date: 07/30/17 Referral Source: [facility] Referral Reason: assist with DM, HTN, CAD, and Renal Failure Insurance: Medicare, Airline pilot  Outreach attempt # 1 to patient. HIPAA verified with patient. Patient's past medical history consist of OSA, COPD, Asthma, Atrial Fibrillation, CVA, Fibromyalgia, GERD, HLD, HTN, Obesity, Neuropathy, Neuromuscular D/O, MI, IDDM, Hypothyroidism, CAD, and Hernia, per EMR. Patient wants to be contacted in the afternoon. Patient stated, "He sleeps all day long". He rated his health as poor due to all of his medical conditions. He stated, he was just getting out of the bed around 1030, didn't have his coffee, and didn't want to talk much. Patient was unable to recall any information about his BG readings and HGB A1C. He stated, "It goes up and down". A referral received from MD's office to assist patient with DM, HTN, Renal Failure, and CAD. Patient stated, "He had greater than 2 falls within the 3 months". He verbalized going to the wound clinic weekly due to the falls. Patient agreed to [facility] services.  Plan: RN CM will send referral to [facility] RN for further in home eval/assessment of care needs and management of chronic conditions.  Please see RN CM note for further details. Lake Bells, RN, BSN, MHA/MSL, [place] Telephonic Care Manager Coordinator [facility] Direct Phone: [phone] Toll Free: [phone] Fax: [phone]     Consent:  Plan: RNCM will  Lake Bells, RN, BSN, MHA/MSL, [place] Direct Phone: [phone] Toll Free: [phone] Fax: [phone]

## 2017-08-03 ENCOUNTER — Other Ambulatory Visit: Payer: Self-pay

## 2017-08-03 NOTE — Patient Outreach (Signed)
Telephone assessment: New referral received on 07/31/2017  Placed call to patient at 1:30 and patient states that he is at lunch and can not hear in the restaurant.  3:00pm  Placed another call to patient who asked that I speak with his wife, Dorian Pod. Spoke with Dorian Pod and explained reason for call.  Dorian Pod reports patient needs help with DM and legs weeping.   Offered home visit and Dorian Pod states she does not have a list of appointment with her right now as they are driving.  I confirmed address and she reports she will call me back with a time and date.  PLAN: awaiting a call back to schedule home visit for assessment of needs.  Tomasa Rand, RN, BSN, CEN [place] Coordinator [phone]

## 2017-08-04 DIAGNOSIS — I87312 Chronic venous hypertension (idiopathic) with ulcer of left lower extremity: Secondary | ICD-10-CM | POA: Diagnosis not present

## 2017-08-04 DIAGNOSIS — I872 Venous insufficiency (chronic) (peripheral): Secondary | ICD-10-CM | POA: Diagnosis not present

## 2017-08-04 DIAGNOSIS — L97822 Non-pressure chronic ulcer of other part of left lower leg with fat layer exposed: Secondary | ICD-10-CM | POA: Diagnosis not present

## 2017-08-04 DIAGNOSIS — L97812 Non-pressure chronic ulcer of other part of right lower leg with fat layer exposed: Secondary | ICD-10-CM | POA: Diagnosis not present

## 2017-08-04 DIAGNOSIS — E11622 Type 2 diabetes mellitus with other skin ulcer: Secondary | ICD-10-CM | POA: Diagnosis not present

## 2017-08-04 DIAGNOSIS — S81801A Unspecified open wound, right lower leg, initial encounter: Secondary | ICD-10-CM | POA: Diagnosis not present

## 2017-08-04 DIAGNOSIS — I87313 Chronic venous hypertension (idiopathic) with ulcer of bilateral lower extremity: Secondary | ICD-10-CM | POA: Diagnosis not present

## 2017-08-06 ENCOUNTER — Other Ambulatory Visit: Payer: Self-pay

## 2017-08-06 NOTE — Patient Outreach (Signed)
Telephone assessment: No call back from patient and or wife.  Placed another call to patient. No answer. Left a message requesting a call back.  PLAN: will continue outreach efforts.  Tomasa Rand, RN, BSN, CEN [facility] [employer] [phone]

## 2017-08-07 DIAGNOSIS — S81801A Unspecified open wound, right lower leg, initial encounter: Secondary | ICD-10-CM | POA: Diagnosis not present

## 2017-08-07 DIAGNOSIS — L97822 Non-pressure chronic ulcer of other part of left lower leg with fat layer exposed: Secondary | ICD-10-CM | POA: Diagnosis not present

## 2017-08-07 DIAGNOSIS — I872 Venous insufficiency (chronic) (peripheral): Secondary | ICD-10-CM | POA: Diagnosis not present

## 2017-08-07 DIAGNOSIS — E11622 Type 2 diabetes mellitus with other skin ulcer: Secondary | ICD-10-CM | POA: Diagnosis not present

## 2017-08-10 ENCOUNTER — Other Ambulatory Visit: Payer: Self-pay

## 2017-08-10 NOTE — Patient Outreach (Signed)
Telephone assessment:  3rd attempt to reach patient for scheduling home visit. No return calls.   PLAN: will Corporate treasurer. If no response will plan to close case. Tomasa Rand, RN, BSN, CEN [facility] [employer] [phone]

## 2017-08-11 DIAGNOSIS — L97812 Non-pressure chronic ulcer of other part of right lower leg with fat layer exposed: Secondary | ICD-10-CM | POA: Diagnosis not present

## 2017-08-11 DIAGNOSIS — I87313 Chronic venous hypertension (idiopathic) with ulcer of bilateral lower extremity: Secondary | ICD-10-CM | POA: Diagnosis not present

## 2017-08-11 DIAGNOSIS — E11622 Type 2 diabetes mellitus with other skin ulcer: Secondary | ICD-10-CM | POA: Diagnosis not present

## 2017-08-11 DIAGNOSIS — S81801A Unspecified open wound, right lower leg, initial encounter: Secondary | ICD-10-CM | POA: Diagnosis not present

## 2017-08-11 DIAGNOSIS — I87312 Chronic venous hypertension (idiopathic) with ulcer of left lower extremity: Secondary | ICD-10-CM | POA: Diagnosis not present

## 2017-08-11 DIAGNOSIS — L97822 Non-pressure chronic ulcer of other part of left lower leg with fat layer exposed: Secondary | ICD-10-CM | POA: Diagnosis not present

## 2017-08-13 DIAGNOSIS — L97929 Non-pressure chronic ulcer of unspecified part of left lower leg with unspecified severity: Secondary | ICD-10-CM | POA: Diagnosis not present

## 2017-08-13 DIAGNOSIS — I87312 Chronic venous hypertension (idiopathic) with ulcer of left lower extremity: Secondary | ICD-10-CM | POA: Diagnosis not present

## 2017-08-13 DIAGNOSIS — L97919 Non-pressure chronic ulcer of unspecified part of right lower leg with unspecified severity: Secondary | ICD-10-CM | POA: Diagnosis not present

## 2017-08-14 DIAGNOSIS — Z961 Presence of intraocular lens: Secondary | ICD-10-CM | POA: Diagnosis not present

## 2017-08-14 DIAGNOSIS — H401132 Primary open-angle glaucoma, bilateral, moderate stage: Secondary | ICD-10-CM | POA: Diagnosis not present

## 2017-08-14 DIAGNOSIS — E119 Type 2 diabetes mellitus without complications: Secondary | ICD-10-CM | POA: Diagnosis not present

## 2017-08-17 DIAGNOSIS — I87312 Chronic venous hypertension (idiopathic) with ulcer of left lower extremity: Secondary | ICD-10-CM | POA: Diagnosis not present

## 2017-08-17 DIAGNOSIS — S81801A Unspecified open wound, right lower leg, initial encounter: Secondary | ICD-10-CM | POA: Diagnosis not present

## 2017-08-17 DIAGNOSIS — L97822 Non-pressure chronic ulcer of other part of left lower leg with fat layer exposed: Secondary | ICD-10-CM | POA: Diagnosis not present

## 2017-08-17 DIAGNOSIS — E11622 Type 2 diabetes mellitus with other skin ulcer: Secondary | ICD-10-CM | POA: Diagnosis not present

## 2017-08-18 DIAGNOSIS — E1142 Type 2 diabetes mellitus with diabetic polyneuropathy: Secondary | ICD-10-CM | POA: Diagnosis not present

## 2017-08-18 DIAGNOSIS — I10 Essential (primary) hypertension: Secondary | ICD-10-CM | POA: Diagnosis not present

## 2017-08-18 DIAGNOSIS — E1165 Type 2 diabetes mellitus with hyperglycemia: Secondary | ICD-10-CM | POA: Diagnosis not present

## 2017-08-18 DIAGNOSIS — E785 Hyperlipidemia, unspecified: Secondary | ICD-10-CM | POA: Diagnosis not present

## 2017-08-18 DIAGNOSIS — Z794 Long term (current) use of insulin: Secondary | ICD-10-CM | POA: Diagnosis not present

## 2017-08-20 DIAGNOSIS — I87313 Chronic venous hypertension (idiopathic) with ulcer of bilateral lower extremity: Secondary | ICD-10-CM | POA: Diagnosis not present

## 2017-08-20 DIAGNOSIS — S81801A Unspecified open wound, right lower leg, initial encounter: Secondary | ICD-10-CM | POA: Diagnosis not present

## 2017-08-20 DIAGNOSIS — L97822 Non-pressure chronic ulcer of other part of left lower leg with fat layer exposed: Secondary | ICD-10-CM | POA: Diagnosis not present

## 2017-08-20 DIAGNOSIS — I87312 Chronic venous hypertension (idiopathic) with ulcer of left lower extremity: Secondary | ICD-10-CM | POA: Diagnosis not present

## 2017-08-20 DIAGNOSIS — L97812 Non-pressure chronic ulcer of other part of right lower leg with fat layer exposed: Secondary | ICD-10-CM | POA: Diagnosis not present

## 2017-08-21 ENCOUNTER — Other Ambulatory Visit: Payer: Self-pay

## 2017-08-21 NOTE — Patient Outreach (Signed)
Case Closure:  No response to letter or phone outreaches.   PLAN: Will close case. Tomasa Rand, RN, BSN, CEN [facility] [employer] [phone]

## 2017-08-28 DIAGNOSIS — H16143 Punctate keratitis, bilateral: Secondary | ICD-10-CM | POA: Diagnosis not present

## 2017-09-08 DIAGNOSIS — E1121 Type 2 diabetes mellitus with diabetic nephropathy: Secondary | ICD-10-CM | POA: Diagnosis not present

## 2017-09-08 DIAGNOSIS — R600 Localized edema: Secondary | ICD-10-CM | POA: Diagnosis not present

## 2017-09-08 DIAGNOSIS — L97829 Non-pressure chronic ulcer of other part of left lower leg with unspecified severity: Secondary | ICD-10-CM | POA: Diagnosis not present

## 2017-09-08 DIAGNOSIS — L97819 Non-pressure chronic ulcer of other part of right lower leg with unspecified severity: Secondary | ICD-10-CM | POA: Diagnosis not present

## 2017-09-08 DIAGNOSIS — H65191 Other acute nonsuppurative otitis media, right ear: Secondary | ICD-10-CM | POA: Diagnosis not present

## 2017-09-08 DIAGNOSIS — I10 Essential (primary) hypertension: Secondary | ICD-10-CM | POA: Diagnosis not present

## 2017-09-08 DIAGNOSIS — K5903 Drug induced constipation: Secondary | ICD-10-CM | POA: Diagnosis not present

## 2017-09-08 DIAGNOSIS — Z09 Encounter for follow-up examination after completed treatment for conditions other than malignant neoplasm: Secondary | ICD-10-CM | POA: Diagnosis not present

## 2017-09-08 DIAGNOSIS — I5032 Chronic diastolic (congestive) heart failure: Secondary | ICD-10-CM | POA: Diagnosis not present

## 2017-09-08 DIAGNOSIS — I482 Chronic atrial fibrillation: Secondary | ICD-10-CM | POA: Diagnosis not present

## 2017-09-08 DIAGNOSIS — E782 Mixed hyperlipidemia: Secondary | ICD-10-CM | POA: Diagnosis not present

## 2017-09-08 DIAGNOSIS — I87313 Chronic venous hypertension (idiopathic) with ulcer of bilateral lower extremity: Secondary | ICD-10-CM | POA: Diagnosis not present

## 2017-09-16 DIAGNOSIS — G894 Chronic pain syndrome: Secondary | ICD-10-CM | POA: Diagnosis not present

## 2017-09-16 DIAGNOSIS — M170 Bilateral primary osteoarthritis of knee: Secondary | ICD-10-CM | POA: Diagnosis not present

## 2017-09-16 DIAGNOSIS — M25561 Pain in right knee: Secondary | ICD-10-CM | POA: Diagnosis not present

## 2017-09-16 DIAGNOSIS — M25562 Pain in left knee: Secondary | ICD-10-CM | POA: Diagnosis not present

## 2017-09-16 DIAGNOSIS — T402X5A Adverse effect of other opioids, initial encounter: Secondary | ICD-10-CM | POA: Diagnosis not present

## 2017-09-16 DIAGNOSIS — K5903 Drug induced constipation: Secondary | ICD-10-CM | POA: Diagnosis not present

## 2017-09-16 DIAGNOSIS — G8929 Other chronic pain: Secondary | ICD-10-CM | POA: Diagnosis not present

## 2017-10-23 DIAGNOSIS — G4733 Obstructive sleep apnea (adult) (pediatric): Secondary | ICD-10-CM | POA: Diagnosis not present

## 2017-10-23 DIAGNOSIS — Z885 Allergy status to narcotic agent status: Secondary | ICD-10-CM | POA: Diagnosis not present

## 2017-10-23 DIAGNOSIS — M25562 Pain in left knee: Secondary | ICD-10-CM | POA: Diagnosis not present

## 2017-10-23 DIAGNOSIS — Z888 Allergy status to other drugs, medicaments and biological substances status: Secondary | ICD-10-CM | POA: Diagnosis not present

## 2017-10-23 DIAGNOSIS — Z7951 Long term (current) use of inhaled steroids: Secondary | ICD-10-CM | POA: Diagnosis not present

## 2017-10-23 DIAGNOSIS — E669 Obesity, unspecified: Secondary | ICD-10-CM | POA: Diagnosis not present

## 2017-10-23 DIAGNOSIS — G8929 Other chronic pain: Secondary | ICD-10-CM | POA: Diagnosis not present

## 2017-10-23 DIAGNOSIS — I509 Heart failure, unspecified: Secondary | ICD-10-CM | POA: Diagnosis not present

## 2017-10-23 DIAGNOSIS — K219 Gastro-esophageal reflux disease without esophagitis: Secondary | ICD-10-CM | POA: Diagnosis not present

## 2017-10-23 DIAGNOSIS — Z9884 Bariatric surgery status: Secondary | ICD-10-CM | POA: Diagnosis not present

## 2017-10-23 DIAGNOSIS — Z6841 Body Mass Index (BMI) 40.0 and over, adult: Secondary | ICD-10-CM | POA: Diagnosis not present

## 2017-10-23 DIAGNOSIS — E119 Type 2 diabetes mellitus without complications: Secondary | ICD-10-CM | POA: Diagnosis not present

## 2017-10-23 DIAGNOSIS — Z880 Allergy status to penicillin: Secondary | ICD-10-CM | POA: Diagnosis not present

## 2017-10-23 DIAGNOSIS — G2581 Restless legs syndrome: Secondary | ICD-10-CM | POA: Diagnosis not present

## 2017-10-23 DIAGNOSIS — E039 Hypothyroidism, unspecified: Secondary | ICD-10-CM | POA: Diagnosis not present

## 2017-10-23 DIAGNOSIS — Z9049 Acquired absence of other specified parts of digestive tract: Secondary | ICD-10-CM | POA: Diagnosis not present

## 2017-10-23 DIAGNOSIS — I110 Hypertensive heart disease with heart failure: Secondary | ICD-10-CM | POA: Diagnosis not present

## 2017-10-23 DIAGNOSIS — F329 Major depressive disorder, single episode, unspecified: Secondary | ICD-10-CM | POA: Diagnosis not present

## 2017-10-23 DIAGNOSIS — Z9981 Dependence on supplemental oxygen: Secondary | ICD-10-CM | POA: Diagnosis not present

## 2017-10-23 DIAGNOSIS — E785 Hyperlipidemia, unspecified: Secondary | ICD-10-CM | POA: Diagnosis not present

## 2017-10-23 DIAGNOSIS — M1712 Unilateral primary osteoarthritis, left knee: Secondary | ICD-10-CM | POA: Diagnosis not present

## 2017-10-23 DIAGNOSIS — J45909 Unspecified asthma, uncomplicated: Secondary | ICD-10-CM | POA: Diagnosis not present

## 2017-10-23 DIAGNOSIS — M25561 Pain in right knee: Secondary | ICD-10-CM | POA: Diagnosis not present

## 2017-10-23 DIAGNOSIS — I4891 Unspecified atrial fibrillation: Secondary | ICD-10-CM | POA: Diagnosis not present

## 2017-10-23 DIAGNOSIS — M170 Bilateral primary osteoarthritis of knee: Secondary | ICD-10-CM | POA: Diagnosis not present

## 2017-10-27 DIAGNOSIS — H401132 Primary open-angle glaucoma, bilateral, moderate stage: Secondary | ICD-10-CM | POA: Diagnosis not present

## 2017-10-27 DIAGNOSIS — H16143 Punctate keratitis, bilateral: Secondary | ICD-10-CM | POA: Diagnosis not present

## 2017-10-29 DIAGNOSIS — M25561 Pain in right knee: Secondary | ICD-10-CM | POA: Diagnosis not present

## 2017-10-29 DIAGNOSIS — M170 Bilateral primary osteoarthritis of knee: Secondary | ICD-10-CM | POA: Diagnosis not present

## 2017-11-05 DIAGNOSIS — J441 Chronic obstructive pulmonary disease with (acute) exacerbation: Secondary | ICD-10-CM | POA: Diagnosis not present

## 2017-11-05 DIAGNOSIS — J018 Other acute sinusitis: Secondary | ICD-10-CM | POA: Diagnosis not present

## 2017-11-05 DIAGNOSIS — J00 Acute nasopharyngitis [common cold]: Secondary | ICD-10-CM | POA: Diagnosis not present

## 2017-11-10 DIAGNOSIS — G894 Chronic pain syndrome: Secondary | ICD-10-CM | POA: Diagnosis not present

## 2017-11-10 DIAGNOSIS — M25561 Pain in right knee: Secondary | ICD-10-CM | POA: Diagnosis not present

## 2017-11-10 DIAGNOSIS — M170 Bilateral primary osteoarthritis of knee: Secondary | ICD-10-CM | POA: Diagnosis not present

## 2017-11-10 DIAGNOSIS — K5903 Drug induced constipation: Secondary | ICD-10-CM | POA: Insufficient documentation

## 2017-11-10 DIAGNOSIS — G8929 Other chronic pain: Secondary | ICD-10-CM | POA: Diagnosis not present

## 2017-11-10 DIAGNOSIS — T402X5D Adverse effect of other opioids, subsequent encounter: Secondary | ICD-10-CM | POA: Diagnosis not present

## 2017-11-10 DIAGNOSIS — M25562 Pain in left knee: Secondary | ICD-10-CM | POA: Diagnosis not present

## 2017-11-10 HISTORY — DX: Drug induced constipation: K59.03

## 2017-11-12 ENCOUNTER — Telehealth: Payer: Self-pay

## 2017-11-12 NOTE — Telephone Encounter (Signed)
   [facility] Pre-operative Risk Assessment    Request for surgical clearance:  1. What type of surgery is being performed? Left Genicular Radiofrequency Ablation    2. When is this surgery scheduled? 11-20-2017   What type of clearance is required (medical clearance vs. Pharmacy clearance to hold med vs. Both)? Pharmacy  3. Are there any medications that need to be held prior to surgery and how long? Hold Xarelto for 3 days  4. Practice name and name of physician performing surgery? [facility] , Dr. Urbano Heir     5. What is your office phone and fax number? [phone], fax [phone]    6. Anesthesia type (None, local, MAC, general) ? Not listed    Joaquim Lai 11/12/2017, 9:05 AM  _________________________________________________________________   (provider comments below)

## 2017-11-13 NOTE — Telephone Encounter (Signed)
Patient with diagnosis of Afib on Xarelto for anticoagulation.    Procedure: Genicular Radiofrequency Ablation Date of procedure: 11/20/17  CHADS2-VASc score of  6 (CHF, HTN, AGE, DM2, stroke/tia x 2, CAD, AGE, male)  CrCl 52mL/min (based on Scr from 09/16/16 in care everywhere)  Per office protocol, patient can hold Xarelto for 24 hours prior to procedure. With history of stroke would recommend resume Xarelto as soon as safe after procedure.

## 2017-11-18 DIAGNOSIS — M1711 Unilateral primary osteoarthritis, right knee: Secondary | ICD-10-CM | POA: Diagnosis not present

## 2017-11-18 DIAGNOSIS — I739 Peripheral vascular disease, unspecified: Secondary | ICD-10-CM | POA: Diagnosis not present

## 2017-11-19 DIAGNOSIS — I739 Peripheral vascular disease, unspecified: Secondary | ICD-10-CM | POA: Diagnosis not present

## 2017-11-19 DIAGNOSIS — R0602 Shortness of breath: Secondary | ICD-10-CM | POA: Diagnosis not present

## 2017-11-19 DIAGNOSIS — I10 Essential (primary) hypertension: Secondary | ICD-10-CM | POA: Diagnosis not present

## 2017-11-19 DIAGNOSIS — Z6841 Body Mass Index (BMI) 40.0 and over, adult: Secondary | ICD-10-CM | POA: Diagnosis not present

## 2017-11-19 DIAGNOSIS — I2510 Atherosclerotic heart disease of native coronary artery without angina pectoris: Secondary | ICD-10-CM | POA: Diagnosis not present

## 2017-11-19 DIAGNOSIS — M1711 Unilateral primary osteoarthritis, right knee: Secondary | ICD-10-CM | POA: Diagnosis not present

## 2017-11-19 DIAGNOSIS — I482 Chronic atrial fibrillation: Secondary | ICD-10-CM | POA: Diagnosis not present

## 2017-11-19 DIAGNOSIS — Z01810 Encounter for preprocedural cardiovascular examination: Secondary | ICD-10-CM | POA: Diagnosis not present

## 2017-11-20 DIAGNOSIS — Z87891 Personal history of nicotine dependence: Secondary | ICD-10-CM | POA: Diagnosis not present

## 2017-11-20 DIAGNOSIS — Z7901 Long term (current) use of anticoagulants: Secondary | ICD-10-CM | POA: Diagnosis not present

## 2017-11-20 DIAGNOSIS — T402X5S Adverse effect of other opioids, sequela: Secondary | ICD-10-CM | POA: Diagnosis not present

## 2017-11-20 DIAGNOSIS — M170 Bilateral primary osteoarthritis of knee: Secondary | ICD-10-CM | POA: Diagnosis not present

## 2017-11-20 DIAGNOSIS — I4891 Unspecified atrial fibrillation: Secondary | ICD-10-CM | POA: Diagnosis not present

## 2017-11-20 DIAGNOSIS — M1712 Unilateral primary osteoarthritis, left knee: Secondary | ICD-10-CM | POA: Diagnosis not present

## 2017-11-20 DIAGNOSIS — I509 Heart failure, unspecified: Secondary | ICD-10-CM | POA: Diagnosis not present

## 2017-11-20 DIAGNOSIS — M25561 Pain in right knee: Secondary | ICD-10-CM | POA: Diagnosis not present

## 2017-11-20 DIAGNOSIS — Z7952 Long term (current) use of systemic steroids: Secondary | ICD-10-CM | POA: Diagnosis not present

## 2017-11-20 DIAGNOSIS — E1122 Type 2 diabetes mellitus with diabetic chronic kidney disease: Secondary | ICD-10-CM | POA: Diagnosis not present

## 2017-11-20 DIAGNOSIS — F329 Major depressive disorder, single episode, unspecified: Secondary | ICD-10-CM | POA: Diagnosis not present

## 2017-11-20 DIAGNOSIS — K219 Gastro-esophageal reflux disease without esophagitis: Secondary | ICD-10-CM | POA: Diagnosis not present

## 2017-11-20 DIAGNOSIS — E669 Obesity, unspecified: Secondary | ICD-10-CM | POA: Diagnosis not present

## 2017-11-20 DIAGNOSIS — J45909 Unspecified asthma, uncomplicated: Secondary | ICD-10-CM | POA: Diagnosis not present

## 2017-11-20 DIAGNOSIS — Z79899 Other long term (current) drug therapy: Secondary | ICD-10-CM | POA: Diagnosis not present

## 2017-11-20 DIAGNOSIS — E039 Hypothyroidism, unspecified: Secondary | ICD-10-CM | POA: Diagnosis not present

## 2017-11-20 DIAGNOSIS — Z7951 Long term (current) use of inhaled steroids: Secondary | ICD-10-CM | POA: Diagnosis not present

## 2017-11-20 DIAGNOSIS — K5903 Drug induced constipation: Secondary | ICD-10-CM | POA: Diagnosis not present

## 2017-11-20 DIAGNOSIS — M25562 Pain in left knee: Secondary | ICD-10-CM | POA: Diagnosis not present

## 2017-11-20 DIAGNOSIS — I130 Hypertensive heart and chronic kidney disease with heart failure and stage 1 through stage 4 chronic kidney disease, or unspecified chronic kidney disease: Secondary | ICD-10-CM | POA: Diagnosis not present

## 2017-11-20 DIAGNOSIS — Z794 Long term (current) use of insulin: Secondary | ICD-10-CM | POA: Diagnosis not present

## 2017-11-20 DIAGNOSIS — G894 Chronic pain syndrome: Secondary | ICD-10-CM | POA: Diagnosis not present

## 2017-11-20 DIAGNOSIS — N189 Chronic kidney disease, unspecified: Secondary | ICD-10-CM | POA: Diagnosis not present

## 2017-11-20 DIAGNOSIS — G4733 Obstructive sleep apnea (adult) (pediatric): Secondary | ICD-10-CM | POA: Diagnosis not present

## 2017-11-20 DIAGNOSIS — G2581 Restless legs syndrome: Secondary | ICD-10-CM | POA: Diagnosis not present

## 2017-11-24 DIAGNOSIS — I088 Other rheumatic multiple valve diseases: Secondary | ICD-10-CM | POA: Diagnosis not present

## 2017-11-24 DIAGNOSIS — I517 Cardiomegaly: Secondary | ICD-10-CM | POA: Diagnosis not present

## 2017-12-07 ENCOUNTER — Encounter: Payer: Self-pay | Admitting: Pulmonary Disease

## 2017-12-07 ENCOUNTER — Ambulatory Visit (INDEPENDENT_AMBULATORY_CARE_PROVIDER_SITE_OTHER): Payer: Medicare Other | Admitting: Pulmonary Disease

## 2017-12-07 VITALS — BP 132/88 | HR 76 | Ht 67.0 in | Wt 275.0 lb

## 2017-12-07 DIAGNOSIS — J449 Chronic obstructive pulmonary disease, unspecified: Secondary | ICD-10-CM | POA: Diagnosis not present

## 2017-12-07 DIAGNOSIS — J9611 Chronic respiratory failure with hypoxia: Secondary | ICD-10-CM

## 2017-12-07 DIAGNOSIS — Z6841 Body Mass Index (BMI) 40.0 and over, adult: Secondary | ICD-10-CM

## 2017-12-07 DIAGNOSIS — E662 Morbid (severe) obesity with alveolar hypoventilation: Secondary | ICD-10-CM | POA: Diagnosis not present

## 2017-12-07 DIAGNOSIS — B029 Zoster without complications: Secondary | ICD-10-CM

## 2017-12-07 DIAGNOSIS — J8489 Other specified interstitial pulmonary diseases: Secondary | ICD-10-CM | POA: Diagnosis not present

## 2017-12-07 DIAGNOSIS — G4733 Obstructive sleep apnea (adult) (pediatric): Secondary | ICD-10-CM | POA: Diagnosis not present

## 2017-12-07 MED ORDER — PREDNISONE 5 MG PO TABS: 5.0000 mg | ORAL_TABLET | Freq: Every day | ORAL | 5 refills | Status: DC

## 2017-12-07 MED ORDER — FLUTICASONE-SALMETEROL 500-50 MCG/DOSE IN AEPB: 1.0000 | INHALATION_SPRAY | Freq: Two times a day (BID) | RESPIRATORY_TRACT | 11 refills | Status: DC

## 2017-12-07 NOTE — Patient Instructions (Signed)
Advair one puff twice per day and rinse mouth after each use  Stop anoro  Prednisone 5 mg pill > alternate 2 pills daily and 1 pill daily for next two weeks.  If breathing okay, then change to 1 pill daily  Will arrange for new CPAP machine  Follow up in 2 months after getting new CPAP machine

## 2017-12-07 NOTE — Progress Notes (Signed)
[facility]  Chief Complaint  Patient presents with  . Follow-up    Pt had CPAP machine over 42yrs, requesting a new machine and supplies    Vital signs: BP 132/88 (BP Location: Left Arm, Cuff Size: Normal)   Pulse 76   Ht 5\' 7"  (1.702 m)   Wt 275 lb (124.7 kg)   SpO2 95%   BMI 43.07 kg/m   History of Present Illness: Jon [name] is a 67 y.o. 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  He has been using anoro.  He isn't sure why this was switched from advair.  Advair worked better, and was less expensive.    He remains on 10 mg prednisone daily.  He isn't having cough, wheeze, sputum, chest pain, fever, or hemoptysis.  His current CPAP is more than 69 yrs old.  He has trouble with his mask strap wearing out before he is due for a new one.  He developed a rash along his right chest.   Physical Exam:  General - pleasant Eyes - pupils reactive ENT - no sinus tenderness, no oral exudate, no LAN Cardiac - regular, no murmur Chest - no wheeze, rales Abd - soft, non tender Ext - no edema Skin - raised purple colored lesions in 7 th rib dermatomal distribution Neuro - normal strength Psych - normal mood   Assessment/Plan:  Recurrent pneumonia with hemoptysis likely from recurrent aspiration. - not an issue at present  - monitor clinically  COPD with chronic bronchitis. - change back to advair and prn albuterol - d/c anoro  BOOP. - will try to taper him down to 5 mg prednisone daily as tolerated  Obstructive sleep apnea. - he reports compliance with CPAP and benefit from therapy - his machine is more than 69 yrs old - will arrange for new auto CPAP  Chronic respiratory failure with hypoxia. - from COPD, BOOP, and OHS - 2 liters oxygen at night with CPAP  Obesity. - discussed importance of weight loss  Shingles. - advised him to f/u with his PCP   Patient Instructions  Advair one puff  twice per day and rinse mouth after each use  Stop anoro  Prednisone 5 mg pill > alternate 2 pills daily and 1 pill daily for next two weeks.  If breathing okay, then change to 1 pill daily  Will arrange for new CPAP machine  Follow up in 2 months after getting new CPAP machine    Jon Mires, MD [place] 12/07/2017, 10:46 AM  Flow Sheet  Pulmonary tests: Serology 01/19/14 >> all negative VATS lung bx 01/24/14 >> organizing pneumonia Spirometry 06/11/15 >> FEV1 1.27 (39%), FEV1% 67 CT chest 08/08/15 >> ASD RLL CT chest 10/16/15 >> minimal Lt pleural thickening, improved aeration RUL and RLL, persistent b/l lower lobe recticulo-nodular opacities, dilated esophagus with distal wall thickening  Cardiac tests Echo 01/31/14 >> EF 55 to 60%, mod LA and severe RA dilation  Sleep tests PSG 12/14/16 >> AHI 21.8, SpO2 82%  Past Medical History: He  has a past medical history of Abnormal EKG (11/29/2014), Acute renal insufficiency (11/29/2014), Angina, Arthritis, Asthma, Atrial fibrillation ([place]), Back pain (01/25/2015), 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), BOOP (bronchiolitis obliterans with organizing pneumonia) ([place]) (01/20/2014), Chronic anticoagulation-Xarelto (02/09/2014), Chronic atrial fibrillation ([place]) (01/03/2014), Chronic bronchitis ([place]) (12/21/2013), Chronic diastolic heart failure ([place]) (02/22/2015), Chronic pain of both knees (03/18/2017), Chronic respiratory failure ([place]) (04/10/2015), COPD (chronic obstructive pulmonary disease) (  [place]) (01/25/2015), Cough, CVA (cerebral vascular accident) ([place]) (02/09/2014), Dysrhythmia, Fibromyalgia, GERD (gastroesophageal reflux disease), H/O hiatal hernia, Hemoptysis (08/01/2015), Herpes zoster (06/26/2015), History of stroke, Hyperkalemia-repeat pending (11/29/2014), Hyperlipemia, Hyperlipidemia, Hypertension, Hypothyroidism, IDDM (insulin dependent diabetes mellitus) ([place]), Long term (current) use of anticoagulants (03/21/2014), Mild CAD  (02/20/2015), Myocardial infarction [facility]), Neuromuscular disorder ([place]), Neuropathy, Normal coronary arteries 2011 (11/29/2014), Obesity (BMI 30-39.9), On home oxygen therapy, OSA on CPAP, Pericardial effusion, Peritoneal effusion, chronic (02/22/2015), PNA (pneumonia) (04/10/2015), Pneumonia (5/15), Precordial pain (01/26/2015), Preoperative cardiovascular examination (02/24/2016), Primary osteoarthritis of both knees (03/18/2017), Restless leg syndrome, Sleep apnea, Stroke ([place]) (2012), Troponin level elevated (11/29/2014), and Uncontrolled type 2 diabetes mellitus with diabetic polyneuropathy, with long-term current use of insulin ([place]) (12/21/2013).  Past Surgical History: He  has a past surgical history that includes Cataract extraction w/ intraocular lens  implant, bilateral (Bilateral); Sinus exploration (X 2); Appendectomy; Cholecystectomy; Laparoscopic gastric banding (2010); Video bronchoscopy (Bilateral, 12/29/2013); Cardiac catheterization (X 2 then 03/03/2012); Hernia repair; Umbilical hernia repair; Video assisted thoracoscopy (Left, 01/24/2014); Pericardial window (Left, 01/24/2014); and Video bronchoscopy (N/A, 01/24/2014).  Family History: His family history includes Cancer in his mother; Diabetes in his sister; Heart disease in his father; Seizures in his sister; Stroke in his father.  Social History: He  reports that he quit smoking about 11 years ago. His smoking use included cigars. He has never used smokeless tobacco. He reports that he does not drink alcohol or use drugs.  Medications: Allergies as of 12/07/2017      Reactions   Hydrocodone Itching   Morphine Itching   Duloxetine Hcl Other (See Comments)   Codeine Itching   Penicillins Rash      Medication List        Accurate as of 12/07/17 10:46 AM. Always use your most recent med list.          albuterol 108 (90 Base) MCG/ACT inhaler Commonly known as:  PROVENTIL HFA;VENTOLIN HFA Inhale 2 puffs into the lungs every 6 (six) hours  as needed for wheezing or shortness of breath.   albuterol (2.5 MG/3ML) 0.083% nebulizer solution Commonly known as:  PROVENTIL Take 3 mLs (2.5 mg total) by nebulization 2 (two) times daily. And as needed   atorvastatin 40 MG tablet Commonly known as:  LIPITOR Take 40 mg by mouth daily.   azelastine 0.1 % nasal spray Commonly known as:  ASTELIN Place 1 spray into both nostrils at bedtime as needed for allergies.   Calcium Carbonate-Vitamin D 600-400 MG-UNIT tablet Take 1 tablet by mouth 2 (two) times daily.   CENTRUM SILVER PO Take 1 tablet by mouth daily.   diltiazem 120 MG tablet Commonly known as:  CARDIZEM Take 120 mg by mouth daily.   famotidine 20 MG tablet Commonly known as:  PEPCID TAKE 1 TABLET BY MOUTH AT BEDTIME   ferrous sulfate 325 (65 FE) MG tablet Take 325 mg by mouth daily.   fluticasone 50 MCG/ACT nasal spray Commonly known as:  FLONASE Place 1 spray into both nostrils daily as needed for allergies.   Fluticasone-Salmeterol 500-50 MCG/DOSE Aepb Commonly known as:  ADVAIR Inhale 1 puff into the lungs every 12 (twelve) hours.   insulin regular human CONCENTRATED 500 UNIT/ML injection Commonly known as:  HUMULIN R Use up to 0.3 mL in insulin pump daily.  MDD=0.3 mL equivalent to 150 units of regular insulin   isosorbide mononitrate 60 MG 24 hr tablet Commonly known as:  IMDUR Take 60 mg by mouth every morning.  latanoprost 0.005 % ophthalmic solution Commonly known as:  XALATAN Place 1 drop into both eyes at bedtime.   metolazone 2.5 MG tablet Commonly known as:  ZAROXOLYN Take 1 tablet (2.5 mg total) by mouth 2 (two) times a week.   MOVANTIK 25 MG Tabs tablet Generic drug:  naloxegol oxalate Take 25 mg by mouth daily.   nitroGLYCERIN 0.4 MG SL tablet Commonly known as:  NITROSTAT Place 0.4 mg under the tongue every 5 (five) minutes as needed. For chest pain   OXYCONTIN 20 mg 12 hr tablet Generic drug:  oxyCODONE Take 20 mg by mouth  every 12 (twelve) hours.   OXYGEN Inhale into the lungs. 2 liters at bedtime   pantoprazole 40 MG tablet Commonly known as:  PROTONIX Take 40 mg by mouth 2 (two) times daily.   Potassium Chloride ER 20 MEQ Tbcr Take 20 mEq by mouth 3 (three) times daily.   predniSONE 5 MG tablet Commonly known as:  DELTASONE Take 1 tablet (5 mg total) by mouth daily with breakfast.   pregabalin 75 MG capsule Commonly known as:  LYRICA Take 1 capsule (75 mg total) by mouth 2 (two) times daily.   SYSTANE PRESERVATIVE FREE 0.4-0.3 % Soln Generic drug:  Polyethyl Glycol-Propyl Glycol Apply 1 drop to eye 4 (four) times daily as needed (dryness of eye).   torsemide 20 MG tablet Commonly known as:  DEMADEX Take 20 mg by mouth 3 (three) times daily.   XARELTO 15 MG Tabs tablet Generic drug:  Rivaroxaban TAKE 1 TABLET BY MOUTH ONCE DAILY WITH EVENING MEAL

## 2017-12-09 DIAGNOSIS — I480 Paroxysmal atrial fibrillation: Secondary | ICD-10-CM | POA: Diagnosis not present

## 2017-12-09 DIAGNOSIS — E782 Mixed hyperlipidemia: Secondary | ICD-10-CM | POA: Diagnosis not present

## 2017-12-09 DIAGNOSIS — I2510 Atherosclerotic heart disease of native coronary artery without angina pectoris: Secondary | ICD-10-CM | POA: Diagnosis not present

## 2017-12-09 DIAGNOSIS — E11622 Type 2 diabetes mellitus with other skin ulcer: Secondary | ICD-10-CM | POA: Diagnosis not present

## 2017-12-09 DIAGNOSIS — I10 Essential (primary) hypertension: Secondary | ICD-10-CM | POA: Diagnosis not present

## 2017-12-09 DIAGNOSIS — I5032 Chronic diastolic (congestive) heart failure: Secondary | ICD-10-CM | POA: Diagnosis not present

## 2017-12-09 DIAGNOSIS — I119 Hypertensive heart disease without heart failure: Secondary | ICD-10-CM | POA: Diagnosis not present

## 2017-12-09 DIAGNOSIS — E1121 Type 2 diabetes mellitus with diabetic nephropathy: Secondary | ICD-10-CM | POA: Diagnosis not present

## 2017-12-09 DIAGNOSIS — K5903 Drug induced constipation: Secondary | ICD-10-CM | POA: Diagnosis not present

## 2017-12-09 DIAGNOSIS — B028 Zoster with other complications: Secondary | ICD-10-CM | POA: Diagnosis not present

## 2017-12-09 DIAGNOSIS — G894 Chronic pain syndrome: Secondary | ICD-10-CM | POA: Diagnosis not present

## 2017-12-09 DIAGNOSIS — R600 Localized edema: Secondary | ICD-10-CM | POA: Diagnosis not present

## 2017-12-24 DIAGNOSIS — E876 Hypokalemia: Secondary | ICD-10-CM | POA: Diagnosis not present

## 2017-12-25 DIAGNOSIS — K5903 Drug induced constipation: Secondary | ICD-10-CM | POA: Diagnosis not present

## 2017-12-25 DIAGNOSIS — E119 Type 2 diabetes mellitus without complications: Secondary | ICD-10-CM | POA: Diagnosis not present

## 2017-12-25 DIAGNOSIS — I10 Essential (primary) hypertension: Secondary | ICD-10-CM | POA: Diagnosis not present

## 2017-12-25 DIAGNOSIS — Z794 Long term (current) use of insulin: Secondary | ICD-10-CM | POA: Diagnosis not present

## 2017-12-25 DIAGNOSIS — M25562 Pain in left knee: Secondary | ICD-10-CM | POA: Diagnosis not present

## 2017-12-25 DIAGNOSIS — G894 Chronic pain syndrome: Secondary | ICD-10-CM | POA: Diagnosis not present

## 2017-12-25 DIAGNOSIS — Z79899 Other long term (current) drug therapy: Secondary | ICD-10-CM | POA: Diagnosis not present

## 2017-12-25 DIAGNOSIS — M25561 Pain in right knee: Secondary | ICD-10-CM | POA: Diagnosis not present

## 2017-12-25 DIAGNOSIS — G8929 Other chronic pain: Secondary | ICD-10-CM | POA: Diagnosis not present

## 2017-12-25 DIAGNOSIS — M170 Bilateral primary osteoarthritis of knee: Secondary | ICD-10-CM | POA: Diagnosis not present

## 2018-01-11 DIAGNOSIS — M170 Bilateral primary osteoarthritis of knee: Secondary | ICD-10-CM | POA: Diagnosis not present

## 2018-01-26 DIAGNOSIS — H6501 Acute serous otitis media, right ear: Secondary | ICD-10-CM | POA: Diagnosis not present

## 2018-01-26 DIAGNOSIS — H608X1 Other otitis externa, right ear: Secondary | ICD-10-CM | POA: Diagnosis not present

## 2018-02-02 DIAGNOSIS — M25561 Pain in right knee: Secondary | ICD-10-CM | POA: Diagnosis not present

## 2018-02-02 DIAGNOSIS — I10 Essential (primary) hypertension: Secondary | ICD-10-CM | POA: Diagnosis not present

## 2018-02-02 DIAGNOSIS — G894 Chronic pain syndrome: Secondary | ICD-10-CM | POA: Diagnosis not present

## 2018-02-02 DIAGNOSIS — M170 Bilateral primary osteoarthritis of knee: Secondary | ICD-10-CM | POA: Diagnosis not present

## 2018-02-02 DIAGNOSIS — K5903 Drug induced constipation: Secondary | ICD-10-CM | POA: Diagnosis not present

## 2018-02-02 DIAGNOSIS — M79642 Pain in left hand: Secondary | ICD-10-CM | POA: Diagnosis not present

## 2018-02-02 DIAGNOSIS — G8929 Other chronic pain: Secondary | ICD-10-CM | POA: Diagnosis not present

## 2018-02-02 DIAGNOSIS — M25562 Pain in left knee: Secondary | ICD-10-CM | POA: Diagnosis not present

## 2018-02-03 DIAGNOSIS — B0229 Other postherpetic nervous system involvement: Secondary | ICD-10-CM | POA: Diagnosis not present

## 2018-02-03 DIAGNOSIS — Z6839 Body mass index (BMI) 39.0-39.9, adult: Secondary | ICD-10-CM | POA: Diagnosis not present

## 2018-02-03 DIAGNOSIS — J410 Simple chronic bronchitis: Secondary | ICD-10-CM | POA: Diagnosis not present

## 2018-02-03 DIAGNOSIS — Z0000 Encounter for general adult medical examination without abnormal findings: Secondary | ICD-10-CM | POA: Diagnosis not present

## 2018-02-15 ENCOUNTER — Ambulatory Visit: Payer: Medicare Other | Admitting: Pulmonary Disease

## 2018-02-19 DIAGNOSIS — L97822 Non-pressure chronic ulcer of other part of left lower leg with fat layer exposed: Secondary | ICD-10-CM | POA: Diagnosis not present

## 2018-02-19 DIAGNOSIS — L97812 Non-pressure chronic ulcer of other part of right lower leg with fat layer exposed: Secondary | ICD-10-CM | POA: Diagnosis not present

## 2018-02-19 DIAGNOSIS — I739 Peripheral vascular disease, unspecified: Secondary | ICD-10-CM | POA: Diagnosis not present

## 2018-02-19 DIAGNOSIS — I87313 Chronic venous hypertension (idiopathic) with ulcer of bilateral lower extremity: Secondary | ICD-10-CM | POA: Diagnosis not present

## 2018-02-22 DIAGNOSIS — I739 Peripheral vascular disease, unspecified: Secondary | ICD-10-CM | POA: Diagnosis not present

## 2018-02-22 DIAGNOSIS — G8929 Other chronic pain: Secondary | ICD-10-CM | POA: Diagnosis not present

## 2018-02-22 DIAGNOSIS — I63412 Cerebral infarction due to embolism of left middle cerebral artery: Secondary | ICD-10-CM | POA: Diagnosis not present

## 2018-02-22 DIAGNOSIS — L97812 Non-pressure chronic ulcer of other part of right lower leg with fat layer exposed: Secondary | ICD-10-CM | POA: Diagnosis not present

## 2018-02-22 DIAGNOSIS — E1165 Type 2 diabetes mellitus with hyperglycemia: Secondary | ICD-10-CM | POA: Diagnosis not present

## 2018-02-22 DIAGNOSIS — B0229 Other postherpetic nervous system involvement: Secondary | ICD-10-CM | POA: Diagnosis not present

## 2018-02-22 DIAGNOSIS — Z7901 Long term (current) use of anticoagulants: Secondary | ICD-10-CM | POA: Diagnosis not present

## 2018-02-22 DIAGNOSIS — G4733 Obstructive sleep apnea (adult) (pediatric): Secondary | ICD-10-CM | POA: Diagnosis not present

## 2018-02-22 DIAGNOSIS — L97822 Non-pressure chronic ulcer of other part of left lower leg with fat layer exposed: Secondary | ICD-10-CM | POA: Diagnosis not present

## 2018-02-22 DIAGNOSIS — E1121 Type 2 diabetes mellitus with diabetic nephropathy: Secondary | ICD-10-CM | POA: Diagnosis not present

## 2018-02-22 DIAGNOSIS — I482 Chronic atrial fibrillation: Secondary | ICD-10-CM | POA: Diagnosis not present

## 2018-02-22 DIAGNOSIS — Z794 Long term (current) use of insulin: Secondary | ICD-10-CM | POA: Diagnosis not present

## 2018-03-11 DIAGNOSIS — I10 Essential (primary) hypertension: Secondary | ICD-10-CM | POA: Diagnosis not present

## 2018-03-11 DIAGNOSIS — B028 Zoster with other complications: Secondary | ICD-10-CM | POA: Diagnosis not present

## 2018-03-11 DIAGNOSIS — E782 Mixed hyperlipidemia: Secondary | ICD-10-CM | POA: Diagnosis not present

## 2018-04-08 DIAGNOSIS — M170 Bilateral primary osteoarthritis of knee: Secondary | ICD-10-CM | POA: Diagnosis not present

## 2018-04-08 DIAGNOSIS — K5903 Drug induced constipation: Secondary | ICD-10-CM | POA: Diagnosis not present

## 2018-04-08 DIAGNOSIS — S81801A Unspecified open wound, right lower leg, initial encounter: Secondary | ICD-10-CM | POA: Diagnosis not present

## 2018-04-08 DIAGNOSIS — I10 Essential (primary) hypertension: Secondary | ICD-10-CM | POA: Diagnosis not present

## 2018-04-08 DIAGNOSIS — M25562 Pain in left knee: Secondary | ICD-10-CM | POA: Diagnosis not present

## 2018-04-08 DIAGNOSIS — B029 Zoster without complications: Secondary | ICD-10-CM | POA: Diagnosis not present

## 2018-04-08 DIAGNOSIS — G894 Chronic pain syndrome: Secondary | ICD-10-CM | POA: Diagnosis not present

## 2018-04-08 DIAGNOSIS — T402X5A Adverse effect of other opioids, initial encounter: Secondary | ICD-10-CM | POA: Diagnosis not present

## 2018-04-08 DIAGNOSIS — G8929 Other chronic pain: Secondary | ICD-10-CM | POA: Diagnosis not present

## 2018-04-08 DIAGNOSIS — M25561 Pain in right knee: Secondary | ICD-10-CM | POA: Diagnosis not present

## 2018-04-12 DIAGNOSIS — H16143 Punctate keratitis, bilateral: Secondary | ICD-10-CM | POA: Diagnosis not present

## 2018-04-12 DIAGNOSIS — H401132 Primary open-angle glaucoma, bilateral, moderate stage: Secondary | ICD-10-CM | POA: Diagnosis not present

## 2018-04-12 DIAGNOSIS — E119 Type 2 diabetes mellitus without complications: Secondary | ICD-10-CM | POA: Diagnosis not present

## 2018-04-14 DIAGNOSIS — S81801A Unspecified open wound, right lower leg, initial encounter: Secondary | ICD-10-CM | POA: Diagnosis not present

## 2018-05-19 DIAGNOSIS — B0229 Other postherpetic nervous system involvement: Secondary | ICD-10-CM | POA: Diagnosis not present

## 2018-05-19 DIAGNOSIS — G4733 Obstructive sleep apnea (adult) (pediatric): Secondary | ICD-10-CM | POA: Diagnosis not present

## 2018-05-19 DIAGNOSIS — D802 Selective deficiency of immunoglobulin A [IgA]: Secondary | ICD-10-CM | POA: Diagnosis not present

## 2018-06-11 DIAGNOSIS — Z23 Encounter for immunization: Secondary | ICD-10-CM | POA: Diagnosis not present

## 2018-06-11 DIAGNOSIS — E782 Mixed hyperlipidemia: Secondary | ICD-10-CM | POA: Diagnosis not present

## 2018-06-11 DIAGNOSIS — B0223 Postherpetic polyneuropathy: Secondary | ICD-10-CM | POA: Diagnosis not present

## 2018-06-11 DIAGNOSIS — I119 Hypertensive heart disease without heart failure: Secondary | ICD-10-CM | POA: Diagnosis not present

## 2018-06-11 DIAGNOSIS — I5032 Chronic diastolic (congestive) heart failure: Secondary | ICD-10-CM | POA: Diagnosis not present

## 2018-06-11 DIAGNOSIS — R600 Localized edema: Secondary | ICD-10-CM | POA: Diagnosis not present

## 2018-06-11 DIAGNOSIS — E1121 Type 2 diabetes mellitus with diabetic nephropathy: Secondary | ICD-10-CM | POA: Diagnosis not present

## 2018-06-11 DIAGNOSIS — E1169 Type 2 diabetes mellitus with other specified complication: Secondary | ICD-10-CM | POA: Diagnosis not present

## 2018-06-11 DIAGNOSIS — G894 Chronic pain syndrome: Secondary | ICD-10-CM | POA: Diagnosis not present

## 2018-06-11 DIAGNOSIS — I480 Paroxysmal atrial fibrillation: Secondary | ICD-10-CM | POA: Diagnosis not present

## 2018-06-11 DIAGNOSIS — I110 Hypertensive heart disease with heart failure: Secondary | ICD-10-CM | POA: Diagnosis not present

## 2018-06-15 DIAGNOSIS — L97221 Non-pressure chronic ulcer of left calf limited to breakdown of skin: Secondary | ICD-10-CM | POA: Diagnosis not present

## 2018-06-15 DIAGNOSIS — R600 Localized edema: Secondary | ICD-10-CM | POA: Diagnosis not present

## 2018-06-15 DIAGNOSIS — M1711 Unilateral primary osteoarthritis, right knee: Secondary | ICD-10-CM | POA: Diagnosis not present

## 2018-06-24 DIAGNOSIS — I4821 Permanent atrial fibrillation: Secondary | ICD-10-CM | POA: Diagnosis not present

## 2018-06-24 DIAGNOSIS — E1142 Type 2 diabetes mellitus with diabetic polyneuropathy: Secondary | ICD-10-CM | POA: Diagnosis not present

## 2018-06-24 DIAGNOSIS — G894 Chronic pain syndrome: Secondary | ICD-10-CM | POA: Diagnosis not present

## 2018-06-24 DIAGNOSIS — N184 Chronic kidney disease, stage 4 (severe): Secondary | ICD-10-CM | POA: Diagnosis not present

## 2018-06-24 DIAGNOSIS — I110 Hypertensive heart disease with heart failure: Secondary | ICD-10-CM | POA: Diagnosis not present

## 2018-06-24 DIAGNOSIS — I119 Hypertensive heart disease without heart failure: Secondary | ICD-10-CM | POA: Diagnosis not present

## 2018-06-24 DIAGNOSIS — I480 Paroxysmal atrial fibrillation: Secondary | ICD-10-CM | POA: Diagnosis not present

## 2018-06-24 DIAGNOSIS — I5032 Chronic diastolic (congestive) heart failure: Secondary | ICD-10-CM | POA: Diagnosis not present

## 2018-07-07 DIAGNOSIS — I4892 Unspecified atrial flutter: Secondary | ICD-10-CM | POA: Diagnosis not present

## 2018-07-07 DIAGNOSIS — S199XXA Unspecified injury of neck, initial encounter: Secondary | ICD-10-CM | POA: Diagnosis not present

## 2018-07-07 DIAGNOSIS — Z87891 Personal history of nicotine dependence: Secondary | ICD-10-CM | POA: Diagnosis not present

## 2018-07-07 DIAGNOSIS — N183 Chronic kidney disease, stage 3 (moderate): Secondary | ICD-10-CM | POA: Diagnosis not present

## 2018-07-07 DIAGNOSIS — I2510 Atherosclerotic heart disease of native coronary artery without angina pectoris: Secondary | ICD-10-CM | POA: Diagnosis not present

## 2018-07-07 DIAGNOSIS — I129 Hypertensive chronic kidney disease with stage 1 through stage 4 chronic kidney disease, or unspecified chronic kidney disease: Secondary | ICD-10-CM | POA: Diagnosis not present

## 2018-07-07 DIAGNOSIS — Z8673 Personal history of transient ischemic attack (TIA), and cerebral infarction without residual deficits: Secondary | ICD-10-CM | POA: Diagnosis not present

## 2018-07-07 DIAGNOSIS — Z794 Long term (current) use of insulin: Secondary | ICD-10-CM | POA: Diagnosis not present

## 2018-07-07 DIAGNOSIS — I4891 Unspecified atrial fibrillation: Secondary | ICD-10-CM | POA: Diagnosis not present

## 2018-07-07 DIAGNOSIS — S0003XA Contusion of scalp, initial encounter: Secondary | ICD-10-CM | POA: Diagnosis not present

## 2018-07-07 DIAGNOSIS — J449 Chronic obstructive pulmonary disease, unspecified: Secondary | ICD-10-CM | POA: Diagnosis not present

## 2018-07-07 DIAGNOSIS — J189 Pneumonia, unspecified organism: Secondary | ICD-10-CM | POA: Diagnosis not present

## 2018-07-07 DIAGNOSIS — E039 Hypothyroidism, unspecified: Secondary | ICD-10-CM | POA: Diagnosis not present

## 2018-07-07 DIAGNOSIS — I252 Old myocardial infarction: Secondary | ICD-10-CM | POA: Diagnosis not present

## 2018-07-07 DIAGNOSIS — E11649 Type 2 diabetes mellitus with hypoglycemia without coma: Secondary | ICD-10-CM | POA: Diagnosis not present

## 2018-07-07 DIAGNOSIS — S0990XA Unspecified injury of head, initial encounter: Secondary | ICD-10-CM | POA: Diagnosis not present

## 2018-07-07 DIAGNOSIS — E1122 Type 2 diabetes mellitus with diabetic chronic kidney disease: Secondary | ICD-10-CM | POA: Diagnosis not present

## 2018-07-07 DIAGNOSIS — Z79899 Other long term (current) drug therapy: Secondary | ICD-10-CM | POA: Diagnosis not present

## 2018-07-11 DIAGNOSIS — R0602 Shortness of breath: Secondary | ICD-10-CM | POA: Diagnosis not present

## 2018-07-11 DIAGNOSIS — R918 Other nonspecific abnormal finding of lung field: Secondary | ICD-10-CM | POA: Diagnosis not present

## 2018-07-11 DIAGNOSIS — Z8701 Personal history of pneumonia (recurrent): Secondary | ICD-10-CM | POA: Diagnosis not present

## 2018-07-11 DIAGNOSIS — J9811 Atelectasis: Secondary | ICD-10-CM | POA: Diagnosis not present

## 2018-07-11 DIAGNOSIS — I129 Hypertensive chronic kidney disease with stage 1 through stage 4 chronic kidney disease, or unspecified chronic kidney disease: Secondary | ICD-10-CM | POA: Diagnosis present

## 2018-07-11 DIAGNOSIS — Z79899 Other long term (current) drug therapy: Secondary | ICD-10-CM | POA: Diagnosis not present

## 2018-07-11 DIAGNOSIS — J209 Acute bronchitis, unspecified: Secondary | ICD-10-CM | POA: Diagnosis not present

## 2018-07-11 DIAGNOSIS — G4733 Obstructive sleep apnea (adult) (pediatric): Secondary | ICD-10-CM | POA: Diagnosis not present

## 2018-07-11 DIAGNOSIS — R404 Transient alteration of awareness: Secondary | ICD-10-CM | POA: Diagnosis not present

## 2018-07-11 DIAGNOSIS — I2510 Atherosclerotic heart disease of native coronary artery without angina pectoris: Secondary | ICD-10-CM | POA: Diagnosis present

## 2018-07-11 DIAGNOSIS — Z87891 Personal history of nicotine dependence: Secondary | ICD-10-CM | POA: Diagnosis not present

## 2018-07-11 DIAGNOSIS — I252 Old myocardial infarction: Secondary | ICD-10-CM | POA: Diagnosis not present

## 2018-07-11 DIAGNOSIS — Z880 Allergy status to penicillin: Secondary | ICD-10-CM | POA: Diagnosis not present

## 2018-07-11 DIAGNOSIS — E162 Hypoglycemia, unspecified: Secondary | ICD-10-CM | POA: Diagnosis not present

## 2018-07-11 DIAGNOSIS — E785 Hyperlipidemia, unspecified: Secondary | ICD-10-CM | POA: Diagnosis not present

## 2018-07-11 DIAGNOSIS — N183 Chronic kidney disease, stage 3 (moderate): Secondary | ICD-10-CM | POA: Diagnosis present

## 2018-07-11 DIAGNOSIS — I4892 Unspecified atrial flutter: Secondary | ICD-10-CM | POA: Diagnosis not present

## 2018-07-11 DIAGNOSIS — J440 Chronic obstructive pulmonary disease with acute lower respiratory infection: Secondary | ICD-10-CM | POA: Diagnosis present

## 2018-07-11 DIAGNOSIS — I482 Chronic atrial fibrillation, unspecified: Secondary | ICD-10-CM | POA: Diagnosis not present

## 2018-07-11 DIAGNOSIS — Z885 Allergy status to narcotic agent status: Secondary | ICD-10-CM | POA: Diagnosis not present

## 2018-07-11 DIAGNOSIS — M1990 Unspecified osteoarthritis, unspecified site: Secondary | ICD-10-CM | POA: Diagnosis present

## 2018-07-11 DIAGNOSIS — E669 Obesity, unspecified: Secondary | ICD-10-CM | POA: Diagnosis not present

## 2018-07-11 DIAGNOSIS — Z7902 Long term (current) use of antithrombotics/antiplatelets: Secondary | ICD-10-CM | POA: Diagnosis not present

## 2018-07-11 DIAGNOSIS — E11649 Type 2 diabetes mellitus with hypoglycemia without coma: Secondary | ICD-10-CM | POA: Diagnosis not present

## 2018-07-11 DIAGNOSIS — R4020 Unspecified coma: Secondary | ICD-10-CM | POA: Diagnosis not present

## 2018-07-11 DIAGNOSIS — Z794 Long term (current) use of insulin: Secondary | ICD-10-CM | POA: Diagnosis not present

## 2018-07-11 DIAGNOSIS — J189 Pneumonia, unspecified organism: Secondary | ICD-10-CM | POA: Diagnosis not present

## 2018-07-11 DIAGNOSIS — E876 Hypokalemia: Secondary | ICD-10-CM | POA: Diagnosis not present

## 2018-07-11 DIAGNOSIS — R4182 Altered mental status, unspecified: Secondary | ICD-10-CM | POA: Diagnosis not present

## 2018-07-11 DIAGNOSIS — I5030 Unspecified diastolic (congestive) heart failure: Secondary | ICD-10-CM | POA: Diagnosis not present

## 2018-07-11 DIAGNOSIS — E1122 Type 2 diabetes mellitus with diabetic chronic kidney disease: Secondary | ICD-10-CM | POA: Diagnosis present

## 2018-07-11 DIAGNOSIS — K219 Gastro-esophageal reflux disease without esophagitis: Secondary | ICD-10-CM | POA: Diagnosis present

## 2018-07-11 DIAGNOSIS — I10 Essential (primary) hypertension: Secondary | ICD-10-CM | POA: Diagnosis not present

## 2018-07-11 DIAGNOSIS — E161 Other hypoglycemia: Secondary | ICD-10-CM | POA: Diagnosis not present

## 2018-07-14 DIAGNOSIS — E11649 Type 2 diabetes mellitus with hypoglycemia without coma: Secondary | ICD-10-CM | POA: Diagnosis not present

## 2018-07-14 DIAGNOSIS — J189 Pneumonia, unspecified organism: Secondary | ICD-10-CM | POA: Diagnosis not present

## 2018-07-14 DIAGNOSIS — E1142 Type 2 diabetes mellitus with diabetic polyneuropathy: Secondary | ICD-10-CM | POA: Diagnosis not present

## 2018-07-14 DIAGNOSIS — J208 Acute bronchitis due to other specified organisms: Secondary | ICD-10-CM | POA: Diagnosis not present

## 2018-07-14 DIAGNOSIS — E1121 Type 2 diabetes mellitus with diabetic nephropathy: Secondary | ICD-10-CM | POA: Diagnosis not present

## 2018-07-15 DIAGNOSIS — Z7901 Long term (current) use of anticoagulants: Secondary | ICD-10-CM | POA: Diagnosis not present

## 2018-07-15 DIAGNOSIS — I69354 Hemiplegia and hemiparesis following cerebral infarction affecting left non-dominant side: Secondary | ICD-10-CM | POA: Diagnosis not present

## 2018-07-15 DIAGNOSIS — E1142 Type 2 diabetes mellitus with diabetic polyneuropathy: Secondary | ICD-10-CM | POA: Diagnosis not present

## 2018-07-15 DIAGNOSIS — I5030 Unspecified diastolic (congestive) heart failure: Secondary | ICD-10-CM | POA: Diagnosis not present

## 2018-07-15 DIAGNOSIS — Z7952 Long term (current) use of systemic steroids: Secondary | ICD-10-CM | POA: Diagnosis not present

## 2018-07-15 DIAGNOSIS — I130 Hypertensive heart and chronic kidney disease with heart failure and stage 1 through stage 4 chronic kidney disease, or unspecified chronic kidney disease: Secondary | ICD-10-CM | POA: Diagnosis not present

## 2018-07-15 DIAGNOSIS — Z6838 Body mass index (BMI) 38.0-38.9, adult: Secondary | ICD-10-CM | POA: Diagnosis not present

## 2018-07-15 DIAGNOSIS — N183 Chronic kidney disease, stage 3 (moderate): Secondary | ICD-10-CM | POA: Diagnosis not present

## 2018-07-15 DIAGNOSIS — I69322 Dysarthria following cerebral infarction: Secondary | ICD-10-CM | POA: Diagnosis not present

## 2018-07-15 DIAGNOSIS — J189 Pneumonia, unspecified organism: Secondary | ICD-10-CM | POA: Diagnosis not present

## 2018-07-15 DIAGNOSIS — Z7984 Long term (current) use of oral hypoglycemic drugs: Secondary | ICD-10-CM | POA: Diagnosis not present

## 2018-07-15 DIAGNOSIS — I2510 Atherosclerotic heart disease of native coronary artery without angina pectoris: Secondary | ICD-10-CM | POA: Diagnosis not present

## 2018-07-15 DIAGNOSIS — Z87891 Personal history of nicotine dependence: Secondary | ICD-10-CM | POA: Diagnosis not present

## 2018-07-15 DIAGNOSIS — Z9181 History of falling: Secondary | ICD-10-CM | POA: Diagnosis not present

## 2018-07-15 DIAGNOSIS — E669 Obesity, unspecified: Secondary | ICD-10-CM | POA: Diagnosis not present

## 2018-07-15 DIAGNOSIS — Z9981 Dependence on supplemental oxygen: Secondary | ICD-10-CM | POA: Diagnosis not present

## 2018-07-15 DIAGNOSIS — E1122 Type 2 diabetes mellitus with diabetic chronic kidney disease: Secondary | ICD-10-CM | POA: Diagnosis not present

## 2018-07-15 DIAGNOSIS — J209 Acute bronchitis, unspecified: Secondary | ICD-10-CM | POA: Diagnosis not present

## 2018-07-15 DIAGNOSIS — F419 Anxiety disorder, unspecified: Secondary | ICD-10-CM | POA: Diagnosis not present

## 2018-07-15 DIAGNOSIS — M1991 Primary osteoarthritis, unspecified site: Secondary | ICD-10-CM | POA: Diagnosis not present

## 2018-07-15 DIAGNOSIS — G4733 Obstructive sleep apnea (adult) (pediatric): Secondary | ICD-10-CM | POA: Diagnosis not present

## 2018-07-15 DIAGNOSIS — I482 Chronic atrial fibrillation, unspecified: Secondary | ICD-10-CM | POA: Diagnosis not present

## 2018-07-15 DIAGNOSIS — J449 Chronic obstructive pulmonary disease, unspecified: Secondary | ICD-10-CM | POA: Diagnosis not present

## 2018-07-18 DIAGNOSIS — J189 Pneumonia, unspecified organism: Secondary | ICD-10-CM | POA: Diagnosis not present

## 2018-07-18 DIAGNOSIS — I5030 Unspecified diastolic (congestive) heart failure: Secondary | ICD-10-CM | POA: Diagnosis not present

## 2018-07-18 DIAGNOSIS — J209 Acute bronchitis, unspecified: Secondary | ICD-10-CM | POA: Diagnosis not present

## 2018-07-18 DIAGNOSIS — J449 Chronic obstructive pulmonary disease, unspecified: Secondary | ICD-10-CM | POA: Diagnosis not present

## 2018-07-18 DIAGNOSIS — E1122 Type 2 diabetes mellitus with diabetic chronic kidney disease: Secondary | ICD-10-CM | POA: Diagnosis not present

## 2018-07-18 DIAGNOSIS — I130 Hypertensive heart and chronic kidney disease with heart failure and stage 1 through stage 4 chronic kidney disease, or unspecified chronic kidney disease: Secondary | ICD-10-CM | POA: Diagnosis not present

## 2018-07-19 DIAGNOSIS — E1122 Type 2 diabetes mellitus with diabetic chronic kidney disease: Secondary | ICD-10-CM | POA: Diagnosis not present

## 2018-07-19 DIAGNOSIS — J449 Chronic obstructive pulmonary disease, unspecified: Secondary | ICD-10-CM | POA: Diagnosis not present

## 2018-07-19 DIAGNOSIS — I130 Hypertensive heart and chronic kidney disease with heart failure and stage 1 through stage 4 chronic kidney disease, or unspecified chronic kidney disease: Secondary | ICD-10-CM | POA: Diagnosis not present

## 2018-07-19 DIAGNOSIS — I5030 Unspecified diastolic (congestive) heart failure: Secondary | ICD-10-CM | POA: Diagnosis not present

## 2018-07-19 DIAGNOSIS — J189 Pneumonia, unspecified organism: Secondary | ICD-10-CM | POA: Diagnosis not present

## 2018-07-19 DIAGNOSIS — J209 Acute bronchitis, unspecified: Secondary | ICD-10-CM | POA: Diagnosis not present

## 2018-07-21 DIAGNOSIS — J209 Acute bronchitis, unspecified: Secondary | ICD-10-CM | POA: Diagnosis not present

## 2018-07-21 DIAGNOSIS — E1122 Type 2 diabetes mellitus with diabetic chronic kidney disease: Secondary | ICD-10-CM | POA: Diagnosis not present

## 2018-07-21 DIAGNOSIS — J189 Pneumonia, unspecified organism: Secondary | ICD-10-CM | POA: Diagnosis not present

## 2018-07-21 DIAGNOSIS — J449 Chronic obstructive pulmonary disease, unspecified: Secondary | ICD-10-CM | POA: Diagnosis not present

## 2018-07-21 DIAGNOSIS — I130 Hypertensive heart and chronic kidney disease with heart failure and stage 1 through stage 4 chronic kidney disease, or unspecified chronic kidney disease: Secondary | ICD-10-CM | POA: Diagnosis not present

## 2018-07-21 DIAGNOSIS — I5030 Unspecified diastolic (congestive) heart failure: Secondary | ICD-10-CM | POA: Diagnosis not present

## 2018-07-22 DIAGNOSIS — I130 Hypertensive heart and chronic kidney disease with heart failure and stage 1 through stage 4 chronic kidney disease, or unspecified chronic kidney disease: Secondary | ICD-10-CM | POA: Diagnosis not present

## 2018-07-22 DIAGNOSIS — I5030 Unspecified diastolic (congestive) heart failure: Secondary | ICD-10-CM | POA: Diagnosis not present

## 2018-07-22 DIAGNOSIS — J189 Pneumonia, unspecified organism: Secondary | ICD-10-CM | POA: Diagnosis not present

## 2018-07-22 DIAGNOSIS — J209 Acute bronchitis, unspecified: Secondary | ICD-10-CM | POA: Diagnosis not present

## 2018-07-22 DIAGNOSIS — E1122 Type 2 diabetes mellitus with diabetic chronic kidney disease: Secondary | ICD-10-CM | POA: Diagnosis not present

## 2018-07-22 DIAGNOSIS — J449 Chronic obstructive pulmonary disease, unspecified: Secondary | ICD-10-CM | POA: Diagnosis not present

## 2018-07-28 DIAGNOSIS — I5030 Unspecified diastolic (congestive) heart failure: Secondary | ICD-10-CM | POA: Diagnosis not present

## 2018-07-28 DIAGNOSIS — J209 Acute bronchitis, unspecified: Secondary | ICD-10-CM | POA: Diagnosis not present

## 2018-07-28 DIAGNOSIS — E1122 Type 2 diabetes mellitus with diabetic chronic kidney disease: Secondary | ICD-10-CM | POA: Diagnosis not present

## 2018-07-28 DIAGNOSIS — J189 Pneumonia, unspecified organism: Secondary | ICD-10-CM | POA: Diagnosis not present

## 2018-07-28 DIAGNOSIS — J449 Chronic obstructive pulmonary disease, unspecified: Secondary | ICD-10-CM | POA: Diagnosis not present

## 2018-07-28 DIAGNOSIS — I130 Hypertensive heart and chronic kidney disease with heart failure and stage 1 through stage 4 chronic kidney disease, or unspecified chronic kidney disease: Secondary | ICD-10-CM | POA: Diagnosis not present

## 2018-08-02 DIAGNOSIS — J449 Chronic obstructive pulmonary disease, unspecified: Secondary | ICD-10-CM | POA: Diagnosis not present

## 2018-08-02 DIAGNOSIS — I130 Hypertensive heart and chronic kidney disease with heart failure and stage 1 through stage 4 chronic kidney disease, or unspecified chronic kidney disease: Secondary | ICD-10-CM | POA: Diagnosis not present

## 2018-08-02 DIAGNOSIS — I5030 Unspecified diastolic (congestive) heart failure: Secondary | ICD-10-CM | POA: Diagnosis not present

## 2018-08-02 DIAGNOSIS — E1122 Type 2 diabetes mellitus with diabetic chronic kidney disease: Secondary | ICD-10-CM | POA: Diagnosis not present

## 2018-08-02 DIAGNOSIS — J189 Pneumonia, unspecified organism: Secondary | ICD-10-CM | POA: Diagnosis not present

## 2018-08-02 DIAGNOSIS — J209 Acute bronchitis, unspecified: Secondary | ICD-10-CM | POA: Diagnosis not present

## 2018-08-03 DIAGNOSIS — R05 Cough: Secondary | ICD-10-CM | POA: Diagnosis not present

## 2018-08-03 DIAGNOSIS — J189 Pneumonia, unspecified organism: Secondary | ICD-10-CM | POA: Diagnosis not present

## 2018-08-03 DIAGNOSIS — R0602 Shortness of breath: Secondary | ICD-10-CM | POA: Diagnosis not present

## 2018-08-04 DIAGNOSIS — J189 Pneumonia, unspecified organism: Secondary | ICD-10-CM | POA: Diagnosis not present

## 2018-08-04 DIAGNOSIS — J209 Acute bronchitis, unspecified: Secondary | ICD-10-CM | POA: Diagnosis not present

## 2018-08-04 DIAGNOSIS — J449 Chronic obstructive pulmonary disease, unspecified: Secondary | ICD-10-CM | POA: Diagnosis not present

## 2018-08-04 DIAGNOSIS — E1122 Type 2 diabetes mellitus with diabetic chronic kidney disease: Secondary | ICD-10-CM | POA: Diagnosis not present

## 2018-08-04 DIAGNOSIS — I130 Hypertensive heart and chronic kidney disease with heart failure and stage 1 through stage 4 chronic kidney disease, or unspecified chronic kidney disease: Secondary | ICD-10-CM | POA: Diagnosis not present

## 2018-08-04 DIAGNOSIS — I5030 Unspecified diastolic (congestive) heart failure: Secondary | ICD-10-CM | POA: Diagnosis not present

## 2018-08-10 DIAGNOSIS — E1142 Type 2 diabetes mellitus with diabetic polyneuropathy: Secondary | ICD-10-CM | POA: Diagnosis not present

## 2018-08-10 DIAGNOSIS — E1122 Type 2 diabetes mellitus with diabetic chronic kidney disease: Secondary | ICD-10-CM | POA: Diagnosis not present

## 2018-08-10 DIAGNOSIS — M79674 Pain in right toe(s): Secondary | ICD-10-CM | POA: Diagnosis not present

## 2018-08-10 DIAGNOSIS — J449 Chronic obstructive pulmonary disease, unspecified: Secondary | ICD-10-CM | POA: Diagnosis not present

## 2018-08-10 DIAGNOSIS — I130 Hypertensive heart and chronic kidney disease with heart failure and stage 1 through stage 4 chronic kidney disease, or unspecified chronic kidney disease: Secondary | ICD-10-CM | POA: Diagnosis not present

## 2018-08-10 DIAGNOSIS — I5030 Unspecified diastolic (congestive) heart failure: Secondary | ICD-10-CM | POA: Diagnosis not present

## 2018-08-10 DIAGNOSIS — L89152 Pressure ulcer of sacral region, stage 2: Secondary | ICD-10-CM | POA: Diagnosis not present

## 2018-08-10 DIAGNOSIS — M869 Osteomyelitis, unspecified: Secondary | ICD-10-CM | POA: Diagnosis not present

## 2018-08-10 DIAGNOSIS — L97513 Non-pressure chronic ulcer of other part of right foot with necrosis of muscle: Secondary | ICD-10-CM | POA: Diagnosis not present

## 2018-08-10 DIAGNOSIS — J209 Acute bronchitis, unspecified: Secondary | ICD-10-CM | POA: Diagnosis not present

## 2018-08-10 DIAGNOSIS — S99921A Unspecified injury of right foot, initial encounter: Secondary | ICD-10-CM | POA: Diagnosis not present

## 2018-08-10 DIAGNOSIS — J189 Pneumonia, unspecified organism: Secondary | ICD-10-CM | POA: Diagnosis not present

## 2018-08-11 DIAGNOSIS — Z87891 Personal history of nicotine dependence: Secondary | ICD-10-CM | POA: Diagnosis not present

## 2018-08-11 DIAGNOSIS — I2510 Atherosclerotic heart disease of native coronary artery without angina pectoris: Secondary | ICD-10-CM | POA: Diagnosis not present

## 2018-08-11 DIAGNOSIS — J449 Chronic obstructive pulmonary disease, unspecified: Secondary | ICD-10-CM | POA: Diagnosis not present

## 2018-08-11 DIAGNOSIS — I110 Hypertensive heart disease with heart failure: Secondary | ICD-10-CM | POA: Diagnosis not present

## 2018-08-11 DIAGNOSIS — E1140 Type 2 diabetes mellitus with diabetic neuropathy, unspecified: Secondary | ICD-10-CM | POA: Diagnosis not present

## 2018-08-11 DIAGNOSIS — I509 Heart failure, unspecified: Secondary | ICD-10-CM | POA: Diagnosis not present

## 2018-08-11 DIAGNOSIS — K219 Gastro-esophageal reflux disease without esophagitis: Secondary | ICD-10-CM | POA: Diagnosis not present

## 2018-08-11 DIAGNOSIS — L97512 Non-pressure chronic ulcer of other part of right foot with fat layer exposed: Secondary | ICD-10-CM | POA: Diagnosis not present

## 2018-08-11 DIAGNOSIS — G4730 Sleep apnea, unspecified: Secondary | ICD-10-CM | POA: Diagnosis not present

## 2018-08-11 DIAGNOSIS — S91101A Unspecified open wound of right great toe without damage to nail, initial encounter: Secondary | ICD-10-CM | POA: Diagnosis not present

## 2018-08-11 DIAGNOSIS — E1151 Type 2 diabetes mellitus with diabetic peripheral angiopathy without gangrene: Secondary | ICD-10-CM | POA: Diagnosis not present

## 2018-08-11 DIAGNOSIS — M1990 Unspecified osteoarthritis, unspecified site: Secondary | ICD-10-CM | POA: Diagnosis not present

## 2018-08-11 DIAGNOSIS — F17200 Nicotine dependence, unspecified, uncomplicated: Secondary | ICD-10-CM | POA: Diagnosis not present

## 2018-08-11 DIAGNOSIS — I4891 Unspecified atrial fibrillation: Secondary | ICD-10-CM | POA: Diagnosis not present

## 2018-08-11 DIAGNOSIS — E11621 Type 2 diabetes mellitus with foot ulcer: Secondary | ICD-10-CM | POA: Diagnosis not present

## 2018-08-11 DIAGNOSIS — Z7984 Long term (current) use of oral hypoglycemic drugs: Secondary | ICD-10-CM | POA: Diagnosis not present

## 2018-08-12 DIAGNOSIS — J449 Chronic obstructive pulmonary disease, unspecified: Secondary | ICD-10-CM | POA: Diagnosis not present

## 2018-08-12 DIAGNOSIS — E1122 Type 2 diabetes mellitus with diabetic chronic kidney disease: Secondary | ICD-10-CM | POA: Diagnosis not present

## 2018-08-12 DIAGNOSIS — I5030 Unspecified diastolic (congestive) heart failure: Secondary | ICD-10-CM | POA: Diagnosis not present

## 2018-08-12 DIAGNOSIS — J209 Acute bronchitis, unspecified: Secondary | ICD-10-CM | POA: Diagnosis not present

## 2018-08-12 DIAGNOSIS — J189 Pneumonia, unspecified organism: Secondary | ICD-10-CM | POA: Diagnosis not present

## 2018-08-12 DIAGNOSIS — I130 Hypertensive heart and chronic kidney disease with heart failure and stage 1 through stage 4 chronic kidney disease, or unspecified chronic kidney disease: Secondary | ICD-10-CM | POA: Diagnosis not present

## 2018-08-13 DIAGNOSIS — E1122 Type 2 diabetes mellitus with diabetic chronic kidney disease: Secondary | ICD-10-CM | POA: Diagnosis not present

## 2018-08-13 DIAGNOSIS — I130 Hypertensive heart and chronic kidney disease with heart failure and stage 1 through stage 4 chronic kidney disease, or unspecified chronic kidney disease: Secondary | ICD-10-CM | POA: Diagnosis not present

## 2018-08-13 DIAGNOSIS — J209 Acute bronchitis, unspecified: Secondary | ICD-10-CM | POA: Diagnosis not present

## 2018-08-13 DIAGNOSIS — J449 Chronic obstructive pulmonary disease, unspecified: Secondary | ICD-10-CM | POA: Diagnosis not present

## 2018-08-13 DIAGNOSIS — I5030 Unspecified diastolic (congestive) heart failure: Secondary | ICD-10-CM | POA: Diagnosis not present

## 2018-08-13 DIAGNOSIS — J189 Pneumonia, unspecified organism: Secondary | ICD-10-CM | POA: Diagnosis not present

## 2018-08-16 ENCOUNTER — Other Ambulatory Visit: Payer: Self-pay | Admitting: Cardiology

## 2018-08-16 DIAGNOSIS — I5032 Chronic diastolic (congestive) heart failure: Secondary | ICD-10-CM

## 2018-08-18 DIAGNOSIS — L97512 Non-pressure chronic ulcer of other part of right foot with fat layer exposed: Secondary | ICD-10-CM | POA: Diagnosis not present

## 2018-08-18 DIAGNOSIS — E11621 Type 2 diabetes mellitus with foot ulcer: Secondary | ICD-10-CM | POA: Diagnosis not present

## 2018-08-23 DIAGNOSIS — J209 Acute bronchitis, unspecified: Secondary | ICD-10-CM | POA: Diagnosis not present

## 2018-08-23 DIAGNOSIS — J189 Pneumonia, unspecified organism: Secondary | ICD-10-CM | POA: Diagnosis not present

## 2018-08-23 DIAGNOSIS — E1122 Type 2 diabetes mellitus with diabetic chronic kidney disease: Secondary | ICD-10-CM | POA: Diagnosis not present

## 2018-08-23 DIAGNOSIS — I130 Hypertensive heart and chronic kidney disease with heart failure and stage 1 through stage 4 chronic kidney disease, or unspecified chronic kidney disease: Secondary | ICD-10-CM | POA: Diagnosis not present

## 2018-08-23 DIAGNOSIS — I5030 Unspecified diastolic (congestive) heart failure: Secondary | ICD-10-CM | POA: Diagnosis not present

## 2018-08-23 DIAGNOSIS — L97512 Non-pressure chronic ulcer of other part of right foot with fat layer exposed: Secondary | ICD-10-CM | POA: Diagnosis not present

## 2018-08-23 DIAGNOSIS — J449 Chronic obstructive pulmonary disease, unspecified: Secondary | ICD-10-CM | POA: Diagnosis not present

## 2018-08-23 DIAGNOSIS — E11621 Type 2 diabetes mellitus with foot ulcer: Secondary | ICD-10-CM | POA: Diagnosis not present

## 2018-08-26 DIAGNOSIS — J449 Chronic obstructive pulmonary disease, unspecified: Secondary | ICD-10-CM | POA: Diagnosis not present

## 2018-08-26 DIAGNOSIS — J209 Acute bronchitis, unspecified: Secondary | ICD-10-CM | POA: Diagnosis not present

## 2018-08-26 DIAGNOSIS — I130 Hypertensive heart and chronic kidney disease with heart failure and stage 1 through stage 4 chronic kidney disease, or unspecified chronic kidney disease: Secondary | ICD-10-CM | POA: Diagnosis not present

## 2018-08-26 DIAGNOSIS — J189 Pneumonia, unspecified organism: Secondary | ICD-10-CM | POA: Diagnosis not present

## 2018-08-26 DIAGNOSIS — I5030 Unspecified diastolic (congestive) heart failure: Secondary | ICD-10-CM | POA: Diagnosis not present

## 2018-08-26 DIAGNOSIS — E1122 Type 2 diabetes mellitus with diabetic chronic kidney disease: Secondary | ICD-10-CM | POA: Diagnosis not present

## 2018-08-30 DIAGNOSIS — L97512 Non-pressure chronic ulcer of other part of right foot with fat layer exposed: Secondary | ICD-10-CM | POA: Diagnosis not present

## 2018-08-30 DIAGNOSIS — E11628 Type 2 diabetes mellitus with other skin complications: Secondary | ICD-10-CM

## 2018-08-30 DIAGNOSIS — S99921A Unspecified injury of right foot, initial encounter: Secondary | ICD-10-CM | POA: Diagnosis not present

## 2018-08-30 DIAGNOSIS — I639 Cerebral infarction, unspecified: Secondary | ICD-10-CM | POA: Diagnosis not present

## 2018-08-30 DIAGNOSIS — E11621 Type 2 diabetes mellitus with foot ulcer: Secondary | ICD-10-CM | POA: Diagnosis not present

## 2018-08-30 DIAGNOSIS — I739 Peripheral vascular disease, unspecified: Secondary | ICD-10-CM

## 2018-08-30 DIAGNOSIS — I891 Lymphangitis: Secondary | ICD-10-CM | POA: Diagnosis not present

## 2018-08-30 DIAGNOSIS — R112 Nausea with vomiting, unspecified: Secondary | ICD-10-CM | POA: Diagnosis not present

## 2018-08-30 DIAGNOSIS — L03031 Cellulitis of right toe: Secondary | ICD-10-CM | POA: Diagnosis not present

## 2018-08-30 DIAGNOSIS — L97511 Non-pressure chronic ulcer of other part of right foot limited to breakdown of skin: Secondary | ICD-10-CM

## 2018-08-30 DIAGNOSIS — J449 Chronic obstructive pulmonary disease, unspecified: Secondary | ICD-10-CM

## 2018-08-30 DIAGNOSIS — L02619 Cutaneous abscess of unspecified foot: Secondary | ICD-10-CM

## 2018-08-30 DIAGNOSIS — L089 Local infection of the skin and subcutaneous tissue, unspecified: Secondary | ICD-10-CM

## 2018-08-30 DIAGNOSIS — R739 Hyperglycemia, unspecified: Secondary | ICD-10-CM | POA: Diagnosis not present

## 2018-08-30 DIAGNOSIS — I10 Essential (primary) hypertension: Secondary | ICD-10-CM

## 2018-08-30 DIAGNOSIS — M7989 Other specified soft tissue disorders: Secondary | ICD-10-CM | POA: Diagnosis not present

## 2018-08-30 DIAGNOSIS — L03115 Cellulitis of right lower limb: Secondary | ICD-10-CM | POA: Diagnosis not present

## 2018-08-30 DIAGNOSIS — S91101A Unspecified open wound of right great toe without damage to nail, initial encounter: Secondary | ICD-10-CM | POA: Diagnosis not present

## 2018-08-30 DIAGNOSIS — E785 Hyperlipidemia, unspecified: Secondary | ICD-10-CM

## 2018-08-30 DIAGNOSIS — L03119 Cellulitis of unspecified part of limb: Secondary | ICD-10-CM

## 2018-08-30 DIAGNOSIS — E119 Type 2 diabetes mellitus without complications: Secondary | ICD-10-CM | POA: Diagnosis not present

## 2018-08-30 DIAGNOSIS — R600 Localized edema: Secondary | ICD-10-CM | POA: Diagnosis not present

## 2018-08-30 DIAGNOSIS — I4891 Unspecified atrial fibrillation: Secondary | ICD-10-CM | POA: Diagnosis not present

## 2018-08-31 DIAGNOSIS — E1122 Type 2 diabetes mellitus with diabetic chronic kidney disease: Secondary | ICD-10-CM | POA: Diagnosis present

## 2018-08-31 DIAGNOSIS — E119 Type 2 diabetes mellitus without complications: Secondary | ICD-10-CM | POA: Diagnosis not present

## 2018-08-31 DIAGNOSIS — I10 Essential (primary) hypertension: Secondary | ICD-10-CM | POA: Diagnosis not present

## 2018-08-31 DIAGNOSIS — N183 Chronic kidney disease, stage 3 (moderate): Secondary | ICD-10-CM | POA: Diagnosis present

## 2018-08-31 DIAGNOSIS — I96 Gangrene, not elsewhere classified: Secondary | ICD-10-CM | POA: Diagnosis present

## 2018-08-31 DIAGNOSIS — I5030 Unspecified diastolic (congestive) heart failure: Secondary | ICD-10-CM | POA: Diagnosis not present

## 2018-08-31 DIAGNOSIS — I472 Ventricular tachycardia: Secondary | ICD-10-CM | POA: Diagnosis not present

## 2018-08-31 DIAGNOSIS — R600 Localized edema: Secondary | ICD-10-CM | POA: Diagnosis not present

## 2018-08-31 DIAGNOSIS — I2510 Atherosclerotic heart disease of native coronary artery without angina pectoris: Secondary | ICD-10-CM | POA: Diagnosis present

## 2018-08-31 DIAGNOSIS — E872 Acidosis: Secondary | ICD-10-CM | POA: Diagnosis present

## 2018-08-31 DIAGNOSIS — N179 Acute kidney failure, unspecified: Secondary | ICD-10-CM | POA: Diagnosis present

## 2018-08-31 DIAGNOSIS — K219 Gastro-esophageal reflux disease without esophagitis: Secondary | ICD-10-CM | POA: Diagnosis present

## 2018-08-31 DIAGNOSIS — I130 Hypertensive heart and chronic kidney disease with heart failure and stage 1 through stage 4 chronic kidney disease, or unspecified chronic kidney disease: Secondary | ICD-10-CM | POA: Diagnosis present

## 2018-08-31 DIAGNOSIS — R739 Hyperglycemia, unspecified: Secondary | ICD-10-CM | POA: Diagnosis not present

## 2018-08-31 DIAGNOSIS — I471 Supraventricular tachycardia: Secondary | ICD-10-CM | POA: Diagnosis not present

## 2018-08-31 DIAGNOSIS — G4733 Obstructive sleep apnea (adult) (pediatric): Secondary | ICD-10-CM | POA: Diagnosis present

## 2018-08-31 DIAGNOSIS — Z7902 Long term (current) use of antithrombotics/antiplatelets: Secondary | ICD-10-CM | POA: Diagnosis not present

## 2018-08-31 DIAGNOSIS — I340 Nonrheumatic mitral (valve) insufficiency: Secondary | ICD-10-CM | POA: Diagnosis not present

## 2018-08-31 DIAGNOSIS — L03115 Cellulitis of right lower limb: Secondary | ICD-10-CM | POA: Diagnosis not present

## 2018-08-31 DIAGNOSIS — E1165 Type 2 diabetes mellitus with hyperglycemia: Secondary | ICD-10-CM | POA: Diagnosis present

## 2018-08-31 DIAGNOSIS — Z01818 Encounter for other preprocedural examination: Secondary | ICD-10-CM | POA: Diagnosis not present

## 2018-08-31 DIAGNOSIS — E871 Hypo-osmolality and hyponatremia: Secondary | ICD-10-CM | POA: Diagnosis present

## 2018-08-31 DIAGNOSIS — I482 Chronic atrial fibrillation, unspecified: Secondary | ICD-10-CM | POA: Diagnosis not present

## 2018-08-31 DIAGNOSIS — B958 Unspecified staphylococcus as the cause of diseases classified elsewhere: Secondary | ICD-10-CM | POA: Diagnosis present

## 2018-08-31 DIAGNOSIS — I4891 Unspecified atrial fibrillation: Secondary | ICD-10-CM | POA: Diagnosis not present

## 2018-08-31 DIAGNOSIS — Z794 Long term (current) use of insulin: Secondary | ICD-10-CM | POA: Diagnosis not present

## 2018-08-31 DIAGNOSIS — J449 Chronic obstructive pulmonary disease, unspecified: Secondary | ICD-10-CM | POA: Diagnosis not present

## 2018-08-31 DIAGNOSIS — I252 Old myocardial infarction: Secondary | ICD-10-CM | POA: Diagnosis not present

## 2018-08-31 DIAGNOSIS — I639 Cerebral infarction, unspecified: Secondary | ICD-10-CM | POA: Diagnosis not present

## 2018-08-31 DIAGNOSIS — S99921A Unspecified injury of right foot, initial encounter: Secondary | ICD-10-CM | POA: Diagnosis not present

## 2018-08-31 DIAGNOSIS — Z8673 Personal history of transient ischemic attack (TIA), and cerebral infarction without residual deficits: Secondary | ICD-10-CM | POA: Diagnosis not present

## 2018-08-31 DIAGNOSIS — E785 Hyperlipidemia, unspecified: Secondary | ICD-10-CM | POA: Diagnosis not present

## 2018-08-31 DIAGNOSIS — M7989 Other specified soft tissue disorders: Secondary | ICD-10-CM | POA: Diagnosis not present

## 2018-08-31 DIAGNOSIS — E1152 Type 2 diabetes mellitus with diabetic peripheral angiopathy with gangrene: Secondary | ICD-10-CM | POA: Diagnosis present

## 2018-08-31 DIAGNOSIS — S91101A Unspecified open wound of right great toe without damage to nail, initial encounter: Secondary | ICD-10-CM | POA: Diagnosis not present

## 2018-08-31 DIAGNOSIS — M79674 Pain in right toe(s): Secondary | ICD-10-CM | POA: Diagnosis not present

## 2018-08-31 DIAGNOSIS — Z6832 Body mass index (BMI) 32.0-32.9, adult: Secondary | ICD-10-CM | POA: Diagnosis not present

## 2018-08-31 DIAGNOSIS — I361 Nonrheumatic tricuspid (valve) insufficiency: Secondary | ICD-10-CM | POA: Diagnosis not present

## 2018-08-31 DIAGNOSIS — I5032 Chronic diastolic (congestive) heart failure: Secondary | ICD-10-CM | POA: Diagnosis present

## 2018-08-31 NOTE — Progress Notes (Signed)
Patient admitted at [facility].

## 2018-09-01 ENCOUNTER — Inpatient Hospital Stay (HOSPITAL_COMMUNITY)
Admission: AD | Admit: 2018-09-01 | Discharge: 2018-09-11 | DRG: 270 | Disposition: A | Discharge status: Home Health | Payer: Medicare Other | Source: Other Acute Inpatient Hospital | Attending: Internal Medicine | Admitting: Internal Medicine

## 2018-09-01 ENCOUNTER — Encounter (HOSPITAL_COMMUNITY): Payer: Self-pay | Admitting: Internal Medicine

## 2018-09-01 DIAGNOSIS — Z880 Allergy status to penicillin: Secondary | ICD-10-CM

## 2018-09-01 DIAGNOSIS — J449 Chronic obstructive pulmonary disease, unspecified: Secondary | ICD-10-CM | POA: Diagnosis present

## 2018-09-01 DIAGNOSIS — E1140 Type 2 diabetes mellitus with diabetic neuropathy, unspecified: Secondary | ICD-10-CM | POA: Diagnosis present

## 2018-09-01 DIAGNOSIS — Z794 Long term (current) use of insulin: Secondary | ICD-10-CM | POA: Diagnosis present

## 2018-09-01 DIAGNOSIS — Z9989 Dependence on other enabling machines and devices: Secondary | ICD-10-CM

## 2018-09-01 DIAGNOSIS — Z885 Allergy status to narcotic agent status: Secondary | ICD-10-CM

## 2018-09-01 DIAGNOSIS — G4733 Obstructive sleep apnea (adult) (pediatric): Secondary | ICD-10-CM

## 2018-09-01 DIAGNOSIS — E669 Obesity, unspecified: Secondary | ICD-10-CM | POA: Diagnosis present

## 2018-09-01 DIAGNOSIS — E1152 Type 2 diabetes mellitus with diabetic peripheral angiopathy with gangrene: Secondary | ICD-10-CM | POA: Diagnosis not present

## 2018-09-01 DIAGNOSIS — R7881 Bacteremia: Secondary | ICD-10-CM | POA: Diagnosis present

## 2018-09-01 DIAGNOSIS — Z961 Presence of intraocular lens: Secondary | ICD-10-CM | POA: Diagnosis present

## 2018-09-01 DIAGNOSIS — I480 Paroxysmal atrial fibrillation: Secondary | ICD-10-CM | POA: Diagnosis not present

## 2018-09-01 DIAGNOSIS — Z8249 Family history of ischemic heart disease and other diseases of the circulatory system: Secondary | ICD-10-CM

## 2018-09-01 DIAGNOSIS — I2510 Atherosclerotic heart disease of native coronary artery without angina pectoris: Secondary | ICD-10-CM | POA: Diagnosis present

## 2018-09-01 DIAGNOSIS — Z888 Allergy status to other drugs, medicaments and biological substances status: Secondary | ICD-10-CM | POA: Diagnosis not present

## 2018-09-01 DIAGNOSIS — I96 Gangrene, not elsewhere classified: Secondary | ICD-10-CM | POA: Diagnosis present

## 2018-09-01 DIAGNOSIS — I998 Other disorder of circulatory system: Secondary | ICD-10-CM | POA: Diagnosis not present

## 2018-09-01 DIAGNOSIS — Z8673 Personal history of transient ischemic attack (TIA), and cerebral infarction without residual deficits: Secondary | ICD-10-CM

## 2018-09-01 DIAGNOSIS — E860 Dehydration: Secondary | ICD-10-CM | POA: Diagnosis not present

## 2018-09-01 DIAGNOSIS — I252 Old myocardial infarction: Secondary | ICD-10-CM

## 2018-09-01 DIAGNOSIS — M25562 Pain in left knee: Secondary | ICD-10-CM | POA: Diagnosis present

## 2018-09-01 DIAGNOSIS — I110 Hypertensive heart disease with heart failure: Secondary | ICD-10-CM | POA: Diagnosis present

## 2018-09-01 DIAGNOSIS — E1122 Type 2 diabetes mellitus with diabetic chronic kidney disease: Secondary | ICD-10-CM

## 2018-09-01 DIAGNOSIS — M797 Fibromyalgia: Secondary | ICD-10-CM | POA: Diagnosis present

## 2018-09-01 DIAGNOSIS — E1129 Type 2 diabetes mellitus with other diabetic kidney complication: Secondary | ICD-10-CM | POA: Diagnosis present

## 2018-09-01 DIAGNOSIS — M170 Bilateral primary osteoarthritis of knee: Secondary | ICD-10-CM | POA: Diagnosis present

## 2018-09-01 DIAGNOSIS — E11621 Type 2 diabetes mellitus with foot ulcer: Secondary | ICD-10-CM | POA: Diagnosis not present

## 2018-09-01 DIAGNOSIS — G8929 Other chronic pain: Secondary | ICD-10-CM | POA: Diagnosis not present

## 2018-09-01 DIAGNOSIS — L97529 Non-pressure chronic ulcer of other part of left foot with unspecified severity: Secondary | ICD-10-CM | POA: Diagnosis present

## 2018-09-01 DIAGNOSIS — N170 Acute kidney failure with tubular necrosis: Secondary | ICD-10-CM | POA: Diagnosis not present

## 2018-09-01 DIAGNOSIS — N289 Disorder of kidney and ureter, unspecified: Secondary | ICD-10-CM | POA: Diagnosis not present

## 2018-09-01 DIAGNOSIS — I351 Nonrheumatic aortic (valve) insufficiency: Secondary | ICD-10-CM | POA: Diagnosis not present

## 2018-09-01 DIAGNOSIS — I482 Chronic atrial fibrillation, unspecified: Secondary | ICD-10-CM | POA: Diagnosis present

## 2018-09-01 DIAGNOSIS — I5032 Chronic diastolic (congestive) heart failure: Secondary | ICD-10-CM | POA: Diagnosis not present

## 2018-09-01 DIAGNOSIS — N183 Chronic kidney disease, stage 3 (moderate): Secondary | ICD-10-CM | POA: Diagnosis not present

## 2018-09-01 DIAGNOSIS — I639 Cerebral infarction, unspecified: Secondary | ICD-10-CM | POA: Diagnosis not present

## 2018-09-01 DIAGNOSIS — I4891 Unspecified atrial fibrillation: Secondary | ICD-10-CM | POA: Diagnosis not present

## 2018-09-01 DIAGNOSIS — M25561 Pain in right knee: Secondary | ICD-10-CM | POA: Diagnosis present

## 2018-09-01 DIAGNOSIS — Z79891 Long term (current) use of opiate analgesic: Secondary | ICD-10-CM

## 2018-09-01 DIAGNOSIS — R918 Other nonspecific abnormal finding of lung field: Secondary | ICD-10-CM | POA: Diagnosis not present

## 2018-09-01 DIAGNOSIS — S91101A Unspecified open wound of right great toe without damage to nail, initial encounter: Secondary | ICD-10-CM | POA: Diagnosis not present

## 2018-09-01 DIAGNOSIS — E869 Volume depletion, unspecified: Secondary | ICD-10-CM | POA: Diagnosis not present

## 2018-09-01 DIAGNOSIS — Z7951 Long term (current) use of inhaled steroids: Secondary | ICD-10-CM

## 2018-09-01 DIAGNOSIS — Z9981 Dependence on supplemental oxygen: Secondary | ICD-10-CM

## 2018-09-01 DIAGNOSIS — Z79899 Other long term (current) drug therapy: Secondary | ICD-10-CM

## 2018-09-01 DIAGNOSIS — G2581 Restless legs syndrome: Secondary | ICD-10-CM | POA: Diagnosis present

## 2018-09-01 DIAGNOSIS — Z452 Encounter for adjustment and management of vascular access device: Secondary | ICD-10-CM

## 2018-09-01 DIAGNOSIS — Z9884 Bariatric surgery status: Secondary | ICD-10-CM

## 2018-09-01 DIAGNOSIS — E440 Moderate protein-calorie malnutrition: Secondary | ICD-10-CM

## 2018-09-01 DIAGNOSIS — B9562 Methicillin resistant Staphylococcus aureus infection as the cause of diseases classified elsewhere: Secondary | ICD-10-CM | POA: Diagnosis not present

## 2018-09-01 DIAGNOSIS — Z6836 Body mass index (BMI) 36.0-36.9, adult: Secondary | ICD-10-CM

## 2018-09-01 DIAGNOSIS — Z8701 Personal history of pneumonia (recurrent): Secondary | ICD-10-CM

## 2018-09-01 DIAGNOSIS — Z823 Family history of stroke: Secondary | ICD-10-CM

## 2018-09-01 DIAGNOSIS — E785 Hyperlipidemia, unspecified: Secondary | ICD-10-CM | POA: Diagnosis present

## 2018-09-01 DIAGNOSIS — Z87891 Personal history of nicotine dependence: Secondary | ICD-10-CM | POA: Diagnosis not present

## 2018-09-01 DIAGNOSIS — K219 Gastro-esophageal reflux disease without esophagitis: Secondary | ICD-10-CM | POA: Diagnosis present

## 2018-09-01 DIAGNOSIS — E1142 Type 2 diabetes mellitus with diabetic polyneuropathy: Secondary | ICD-10-CM | POA: Diagnosis not present

## 2018-09-01 DIAGNOSIS — Z7952 Long term (current) use of systemic steroids: Secondary | ICD-10-CM

## 2018-09-01 DIAGNOSIS — J9610 Chronic respiratory failure, unspecified whether with hypoxia or hypercapnia: Secondary | ICD-10-CM | POA: Diagnosis present

## 2018-09-01 DIAGNOSIS — J90 Pleural effusion, not elsewhere classified: Secondary | ICD-10-CM | POA: Diagnosis not present

## 2018-09-01 DIAGNOSIS — I739 Peripheral vascular disease, unspecified: Secondary | ICD-10-CM | POA: Diagnosis not present

## 2018-09-01 DIAGNOSIS — E039 Hypothyroidism, unspecified: Secondary | ICD-10-CM | POA: Diagnosis present

## 2018-09-01 DIAGNOSIS — N179 Acute kidney failure, unspecified: Secondary | ICD-10-CM

## 2018-09-01 DIAGNOSIS — Z9841 Cataract extraction status, right eye: Secondary | ICD-10-CM

## 2018-09-01 DIAGNOSIS — I340 Nonrheumatic mitral (valve) insufficiency: Secondary | ICD-10-CM | POA: Diagnosis not present

## 2018-09-01 DIAGNOSIS — I509 Heart failure, unspecified: Secondary | ICD-10-CM | POA: Diagnosis present

## 2018-09-01 DIAGNOSIS — L03115 Cellulitis of right lower limb: Secondary | ICD-10-CM | POA: Diagnosis not present

## 2018-09-01 DIAGNOSIS — Z9842 Cataract extraction status, left eye: Secondary | ICD-10-CM

## 2018-09-01 DIAGNOSIS — Z7901 Long term (current) use of anticoagulants: Secondary | ICD-10-CM

## 2018-09-01 DIAGNOSIS — R739 Hyperglycemia, unspecified: Secondary | ICD-10-CM | POA: Diagnosis not present

## 2018-09-01 DIAGNOSIS — I10 Essential (primary) hypertension: Secondary | ICD-10-CM

## 2018-09-01 DIAGNOSIS — I70261 Atherosclerosis of native arteries of extremities with gangrene, right leg: Secondary | ICD-10-CM | POA: Diagnosis not present

## 2018-09-01 DIAGNOSIS — E119 Type 2 diabetes mellitus without complications: Secondary | ICD-10-CM

## 2018-09-01 DIAGNOSIS — Z833 Family history of diabetes mellitus: Secondary | ICD-10-CM

## 2018-09-01 DIAGNOSIS — Z9181 History of falling: Secondary | ICD-10-CM | POA: Diagnosis not present

## 2018-09-01 HISTORY — DX: Type 2 diabetes mellitus with diabetic neuropathy, unspecified: E11.40

## 2018-09-01 HISTORY — DX: Type 2 diabetes mellitus with other diabetic kidney complication: E11.29

## 2018-09-01 LAB — CBC WITH DIFFERENTIAL/PLATELET
Abs Immature Granulocytes: 0.05 10*3/uL (ref 0.00–0.07)
BASOS ABS: 0 10*3/uL (ref 0.0–0.1)
Basophils Relative: 0 %
EOS ABS: 0 10*3/uL (ref 0.0–0.5)
EOS PCT: 0 %
HCT: 38.5 % — ABNORMAL LOW (ref 39.0–52.0)
Hemoglobin: 12.7 g/dL — ABNORMAL LOW (ref 13.0–17.0)
Immature Granulocytes: 0 %
LYMPHS PCT: 9 %
Lymphs Abs: 1.1 10*3/uL (ref 0.7–4.0)
MCH: 29 pg (ref 26.0–34.0)
MCHC: 33 g/dL (ref 30.0–36.0)
MCV: 87.9 fL (ref 80.0–100.0)
MONO ABS: 0.8 10*3/uL (ref 0.1–1.0)
Monocytes Relative: 7 %
NRBC: 0 % (ref 0.0–0.2)
Neutro Abs: 10.3 10*3/uL — ABNORMAL HIGH (ref 1.7–7.7)
Neutrophils Relative %: 84 %
PLATELETS: 314 10*3/uL (ref 150–400)
RBC: 4.38 MIL/uL (ref 4.22–5.81)
RDW: 14.1 % (ref 11.5–15.5)
WBC: 12.4 10*3/uL — AB (ref 4.0–10.5)

## 2018-09-01 LAB — COMPREHENSIVE METABOLIC PANEL
ALT: 32 U/L (ref 0–44)
AST: 58 U/L — ABNORMAL HIGH (ref 15–41)
Albumin: 2.5 g/dL — ABNORMAL LOW (ref 3.5–5.0)
Alkaline Phosphatase: 137 U/L — ABNORMAL HIGH (ref 38–126)
Anion gap: 12 (ref 5–15)
BILIRUBIN TOTAL: 1.5 mg/dL — AB (ref 0.3–1.2)
BUN: 34 mg/dL — ABNORMAL HIGH (ref 8–23)
CHLORIDE: 102 mmol/L (ref 98–111)
CO2: 25 mmol/L (ref 22–32)
CREATININE: 2.95 mg/dL — AB (ref 0.61–1.24)
Calcium: 8.8 mg/dL — ABNORMAL LOW (ref 8.9–10.3)
GFR, EST AFRICAN AMERICAN: 24 mL/min — AB (ref >60)
GFR, EST NON AFRICAN AMERICAN: 21 mL/min — AB (ref >60)
Glucose, Bld: 147 mg/dL — ABNORMAL HIGH (ref 70–99)
Potassium: 4.4 mmol/L (ref 3.5–5.1)
Sodium: 139 mmol/L (ref 135–145)
TOTAL PROTEIN: 6.6 g/dL (ref 6.5–8.1)

## 2018-09-01 LAB — HEPARIN LEVEL (UNFRACTIONATED): Heparin Unfractionated: >2.2 IU/mL — ABNORMAL HIGH (ref 0.30–0.70)

## 2018-09-01 LAB — PROTIME-INR
INR: 1.68
Prothrombin Time: 19.6 seconds — ABNORMAL HIGH (ref 11.4–15.2)

## 2018-09-01 LAB — APTT: aPTT: 47 seconds — ABNORMAL HIGH (ref 24–36)

## 2018-09-01 LAB — GLUCOSE, CAPILLARY: Glucose-Capillary: 124 mg/dL — ABNORMAL HIGH (ref 70–99)

## 2018-09-01 MED ORDER — METOPROLOL SUCCINATE ER 25 MG PO TB24
25.0000 mg | ORAL_TABLET | Freq: Every day | ORAL | Status: DC
  Administered 2018-09-01 – 2018-09-11 (×10): 25 mg via ORAL
  Filled 2018-09-01 (×10): qty 1

## 2018-09-01 MED ORDER — DILTIAZEM HCL ER COATED BEADS 120 MG PO CP24
120.0000 mg | ORAL_CAPSULE | Freq: Every day | ORAL | Status: DC
  Administered 2018-09-01: 120 mg via ORAL
  Filled 2018-09-01: qty 1

## 2018-09-01 MED ORDER — IPRATROPIUM BROMIDE 0.02 % IN SOLN
0.5000 mg | Freq: Four times a day (QID) | RESPIRATORY_TRACT | Status: DC
  Filled 2018-09-01: qty 2.5

## 2018-09-01 MED ORDER — ALBUTEROL SULFATE (2.5 MG/3ML) 0.083% IN NEBU
2.5000 mg | INHALATION_SOLUTION | Freq: Four times a day (QID) | RESPIRATORY_TRACT | Status: DC
  Filled 2018-09-01: qty 3

## 2018-09-01 MED ORDER — ONDANSETRON HCL 4 MG PO TABS: 4.0000 mg | ORAL_TABLET | Freq: Four times a day (QID) | ORAL | Status: DC | PRN

## 2018-09-01 MED ORDER — INSULIN ASPART 100 UNIT/ML ~~LOC~~ SOLN
0.0000 [IU] | SUBCUTANEOUS | Status: DC
  Administered 2018-09-01 – 2018-09-02 (×3): 1 [IU] via SUBCUTANEOUS
  Administered 2018-09-02: 2 [IU] via SUBCUTANEOUS
  Administered 2018-09-02 (×2): 1 [IU] via SUBCUTANEOUS
  Administered 2018-09-03: 3 [IU] via SUBCUTANEOUS
  Administered 2018-09-03 (×3): 1 [IU] via SUBCUTANEOUS
  Administered 2018-09-03 – 2018-09-04 (×2): 2 [IU] via SUBCUTANEOUS
  Administered 2018-09-04: 1 [IU] via SUBCUTANEOUS
  Administered 2018-09-04: 2 [IU] via SUBCUTANEOUS
  Administered 2018-09-04: 1 [IU] via SUBCUTANEOUS
  Administered 2018-09-04: 2 [IU] via SUBCUTANEOUS
  Administered 2018-09-04: 1 [IU] via SUBCUTANEOUS
  Administered 2018-09-05: 2 [IU] via SUBCUTANEOUS
  Administered 2018-09-05: 1 [IU] via SUBCUTANEOUS
  Administered 2018-09-05: 3 [IU] via SUBCUTANEOUS
  Administered 2018-09-05: 2 [IU] via SUBCUTANEOUS
  Administered 2018-09-05: 3 [IU] via SUBCUTANEOUS
  Administered 2018-09-05 – 2018-09-06 (×2): 2 [IU] via SUBCUTANEOUS
  Administered 2018-09-06: 1 [IU] via SUBCUTANEOUS
  Administered 2018-09-06: 5 [IU] via SUBCUTANEOUS
  Administered 2018-09-06: 2 [IU] via SUBCUTANEOUS
  Administered 2018-09-06: 1 [IU] via SUBCUTANEOUS
  Administered 2018-09-07: 2 [IU] via SUBCUTANEOUS
  Administered 2018-09-07: 1 [IU] via SUBCUTANEOUS
  Administered 2018-09-07: 2 [IU] via SUBCUTANEOUS
  Administered 2018-09-07: 3 [IU] via SUBCUTANEOUS
  Administered 2018-09-07: 2 [IU] via SUBCUTANEOUS
  Administered 2018-09-07: 1 [IU] via SUBCUTANEOUS
  Administered 2018-09-08: 2 [IU] via SUBCUTANEOUS
  Administered 2018-09-08: 3 [IU] via SUBCUTANEOUS
  Administered 2018-09-08 (×2): 1 [IU] via SUBCUTANEOUS

## 2018-09-01 MED ORDER — ATORVASTATIN CALCIUM 40 MG PO TABS
40.0000 mg | ORAL_TABLET | Freq: Every day | ORAL | Status: DC
  Administered 2018-09-02 – 2018-09-10 (×9): 40 mg via ORAL
  Filled 2018-09-01 (×9): qty 1

## 2018-09-01 MED ORDER — FENTANYL CITRATE (PF) 100 MCG/2ML IJ SOLN: 25.0000 ug | INTRAMUSCULAR | Status: DC | PRN

## 2018-09-01 MED ORDER — VANCOMYCIN HCL 10 G IV SOLR
2000.0000 mg | Freq: Once | INTRAVENOUS | Status: DC
  Filled 2018-09-01: qty 2000

## 2018-09-01 MED ORDER — NITROGLYCERIN 0.4 MG SL SUBL: 0.4000 mg | SUBLINGUAL_TABLET | SUBLINGUAL | Status: DC | PRN

## 2018-09-01 MED ORDER — MOMETASONE FURO-FORMOTEROL FUM 200-5 MCG/ACT IN AERO
2.0000 | INHALATION_SPRAY | Freq: Two times a day (BID) | RESPIRATORY_TRACT | Status: DC
  Administered 2018-09-02 – 2018-09-11 (×15): 2 via RESPIRATORY_TRACT
  Filled 2018-09-01 (×3): qty 8.8

## 2018-09-01 MED ORDER — ACETAMINOPHEN 325 MG PO TABS: 650.0000 mg | ORAL_TABLET | Freq: Four times a day (QID) | ORAL | Status: DC | PRN

## 2018-09-01 MED ORDER — SODIUM CHLORIDE 0.9 % IV SOLN
2.0000 g | Freq: Once | INTRAVENOUS | Status: AC
  Administered 2018-09-01: 2 g via INTRAVENOUS
  Filled 2018-09-01: qty 2

## 2018-09-01 MED ORDER — ACETAMINOPHEN 650 MG RE SUPP: 650.0000 mg | Freq: Four times a day (QID) | RECTAL | Status: DC | PRN

## 2018-09-01 MED ORDER — ALBUTEROL SULFATE (2.5 MG/3ML) 0.083% IN NEBU: 2.5000 mg | INHALATION_SOLUTION | RESPIRATORY_TRACT | Status: DC | PRN

## 2018-09-01 MED ORDER — ONDANSETRON HCL 4 MG/2ML IJ SOLN: 4.0000 mg | Freq: Four times a day (QID) | INTRAMUSCULAR | Status: DC | PRN

## 2018-09-01 NOTE — Progress Notes (Signed)
Pharmacy Antibiotic Note  Jon [name] is a 68 y.o. male admitted on 09/01/2018 with wound infection.  Pharmacy has been consulted for Vancomycin and Merrem dosing.  Baseline labs pending.  Given hx of CKD, will order one time doses and follow-up with labs.  Plan: Vanc 2gm IV x 1 dose Merrem 2gm IV x 1 dose Follow-up SCr and maintenance dosing  Height: 5\' 7"  (170.2 cm) Weight: 235 lb (106.6 kg) IBW/kg (Calculated) : 66.1  Temp (24hrs), Avg:98 F (36.7 C), Min:98 F (36.7 C), Max:98 F (36.7 C)  No results for input(s): WBC, CREATININE, LATICACIDVEN, VANCOTROUGH, VANCOPEAK, VANCORANDOM, GENTTROUGH, GENTPEAK, GENTRANDOM, TOBRATROUGH, TOBRAPEAK, TOBRARND, AMIKACINPEAK, AMIKACINTROU, AMIKACIN in the last 168 hours.  CrCl cannot be calculated (Patient's most recent lab result is older than the maximum 21 days allowed.).    Allergies  Allergen Reactions  . Hydrocodone Itching  . Morphine Itching  . Duloxetine Hcl Other (See Comments)  . Codeine Itching  . Penicillins Rash    Antimicrobials this admission: Vanc 1/1 >> Merrem 1/1 >>  Dose adjustments this admission:   Microbiology results:   Thank you for allowing pharmacy to be a part of this patient's care.  [employer], Pharm.D., BCPS Clinical Pharmacist  **Pharmacist phone directory can now be found on [email or web address] ([email or web address]).  Listed under [place].  09/01/2018 10:09 PM

## 2018-09-01 NOTE — H&P (Signed)
History and Physical    Jon [name]:[id number] DOB: [birth year] DOA: 09/01/2018  PCP: Rochel Brome, MD  Patient coming from: [facility].  Chief Complaint: Right foot great toe gangrene.  Foot discoloration.  Pain.  HPI: Jon [name] is a 68 y.o. male with history of diabetes mellitus type 2, atrial fibrillation, hypertension, COPD, sleep apnea has been experiencing increasing pain and swelling in his right foot over the last 1 month.  Over the last few days he noticed increasing discoloration of his great toe and has been admitted at [facility] for cellulitis and developing gangrene of his right great toe.  Patient has had some subjective feeling of fever and chills.  ED Course: In the hospital patient required x-rays did not show anything concerning for osteomyelitis of the foot and also had a CT scan.  Blood cultures eventually grew staphylococcal bacteremia patient is on vancomycin.  Podiatry was consulted and given the gangrene of the right great toe and poor Doppler signals of the lower extremity requested transfer to tertiary care center for peripheral vascular disease.  Dr. Donzetta Matters, vascular surgeon at [facility] was consulted and patient transferred to [facility] for further management of the peripheral vascular disease.  While in the hospital patient's creatinine has worsened from 1.2-2.2.  Patient's Lasix and metolazone was held.  On my exam patient is not in distress.  Poor pulses in the lower extremity.  Difficult to obtain even with Dopplers.  Review of Systems: As per HPI, rest all negative.   Past Medical History:  Diagnosis Date  . Abnormal EKG 11/29/2014  . Acute renal insufficiency 11/29/2014  . Angina   . Arthritis    "knees" (01/18/2014)  . Asthma   . Atrial fibrillation ([place])   . Back pain 01/25/2015  . 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 BOOP (bronchiolitis obliterans with organizing pneumonia) ([place]) 01/20/2014   OLBx 01/20/14 BOOP Started steroids  02/28/14   . Chronic anticoagulation-Xarelto 02/09/2014  . Chronic atrial fibrillation 01/03/2014  . Chronic bronchitis ([place]) 12/21/2013  . Chronic diastolic heart failure ([place]) 02/22/2015  . Chronic pain of both knees 03/18/2017   Overview:  Added automatically from request for surgery [id number]  . Chronic respiratory failure ([place]) 04/10/2015  . COPD (chronic obstructive pulmonary disease) ([place]) 01/25/2015  . Cough    coughing up blood- started last nite, seems to be worse now  . CVA (cerebral vascular accident) ([place]) 02/09/2014  . Dysrhythmia    atrial fib takes cardizem,   . Fibromyalgia   . GERD (gastroesophageal reflux disease)   . H/O hiatal hernia   . Hemoptysis 08/01/2015  . Herpes zoster 06/26/2015  . History of stroke   . Hyperkalemia-repeat pending 11/29/2014  . Hyperlipemia   . Hyperlipidemia   . Hypertension   . Hypothyroidism   . IDDM (insulin dependent diabetes mellitus) (HCC)    insulin pump   . Long term (current) use of anticoagulants 03/21/2014  . Mild CAD 02/20/2015   Overview:  On recent cardiac cath  Overview:  On recent cardiac cath  . Myocardial infarction [facility])    "one dr says yes; another says no"  . Neuromuscular disorder (HCC)    rls, neuropathy  . Neuropathy    rls, neuropathy   . Normal coronary arteries 2011 11/29/2014  . Obesity (BMI 30-39.9)   . On home oxygen therapy    "2L w/CPAP at bedtime" (01/18/2014)  . OSA on CPAP    cpap & oxygen  .  Pericardial effusion   . Peritoneal effusion, chronic 02/22/2015   Overview:  With surgical window  . PNA (pneumonia) 04/10/2015  . Pneumonia 5/15   "several times" (01/18/2014)  . Precordial pain 01/26/2015  . Preoperative cardiovascular examination 02/24/2016  . Primary osteoarthritis of both knees 03/18/2017   Overview:  Added automatically from request for surgery [id number]  . Restless leg syndrome   . Sleep apnea    cpap & oxygen   . Stroke [facility]) 2012   denies residual on 01/18/2014  . Troponin level elevated  11/29/2014  . Uncontrolled type 2 diabetes mellitus with diabetic polyneuropathy, with long-term current use of insulin ([place]) 12/21/2013    Past Surgical History:  Procedure Laterality Date  . APPENDECTOMY    . CARDIAC CATHETERIZATION  X 2 then 03/03/2012    NL LVF, normal coronaries, vessels are small ([facility]: Dr. Beatrix Fetters)  . CATARACT EXTRACTION W/ INTRAOCULAR LENS  IMPLANT, BILATERAL Bilateral   . CHOLECYSTECTOMY    . HERNIA REPAIR     UHR  . LAPAROSCOPIC GASTRIC BANDING  2010  . PERICARDIAL WINDOW Left 01/24/2014   Procedure: PERICARDIAL WINDOW;  Surgeon: Gaye Pollack, MD;  Location: [place];  Service: Thoracic;  Laterality: Left;  . SINUS EXPLORATION  X 2  . UMBILICAL HERNIA REPAIR    . VIDEO ASSISTED THORACOSCOPY Left 01/24/2014   Procedure: VIDEO ASSISTED THORACOSCOPY;  Surgeon: Gaye Pollack, MD;  [facility];  Service: Thoracic;  Laterality: Left;  VATS/open lung biopsy  . VIDEO BRONCHOSCOPY Bilateral 12/29/2013   Procedure: VIDEO BRONCHOSCOPY WITHOUT FLUORO;  Surgeon: Elsie Stain, MD;  Location: [facility];  Service: Endoscopy;  Laterality: Bilateral;  . VIDEO BRONCHOSCOPY N/A 01/24/2014   Procedure: VIDEO BRONCHOSCOPY;  Surgeon: Gaye Pollack, MD;  Location: [place];  Service: Thoracic;  Laterality: N/A;     reports that he quit smoking about 12 years ago. His smoking use included cigars. He has never used smokeless tobacco. He reports that he does not drink alcohol or use drugs.  Allergies  Allergen Reactions  . Hydrocodone Itching  . Morphine Itching  . Duloxetine Hcl Other (See Comments)  . Codeine Itching  . Penicillins Rash    Family History  Problem Relation Age of Onset  . Cancer Mother   . Stroke Father   . Heart disease Father   . Seizures Sister   . Diabetes Sister   . Anesthesia problems Neg Hx   . Hypotension Neg Hx   . Malignant hyperthermia Neg Hx   . Pseudochol deficiency Neg Hx     Prior to Admission medications   Medication Sig Start Date  End Date Taking? Authorizing Provider  albuterol (PROVENTIL HFA;VENTOLIN HFA) 108 (90 BASE) MCG/ACT inhaler Inhale 2 puffs into the lungs every 6 (six) hours as needed for wheezing or shortness of breath.    [provider]  albuterol (PROVENTIL) (2.5 MG/3ML) 0.083% nebulizer solution Take 3 mLs (2.5 mg total) by nebulization 2 (two) times daily. And as needed 05/03/15   Parrett, Fonnie Mu, NP  atorvastatin (LIPITOR) 40 MG tablet Take 40 mg by mouth daily.    [provider]  azelastine (ASTELIN) 137 MCG/SPRAY nasal spray Place 1 spray into both nostrils at bedtime as needed for allergies.     [provider]  Calcium Carbonate-Vitamin D 600-400 MG-UNIT tablet Take 1 tablet by mouth 2 (two) times daily.    [provider]  diltiazem (CARDIZEM) 120 MG tablet Take 120 mg by mouth  daily. 11/10/16   [provider]  famotidine (PEPCID) 20 MG tablet TAKE 1 TABLET BY MOUTH AT BEDTIME 01/17/15   Elsie Stain, MD  ferrous sulfate 325 (65 FE) MG tablet Take 325 mg by mouth daily.    [provider]  fluticasone (FLONASE) 50 MCG/ACT nasal spray Place 1 spray into both nostrils daily as needed for allergies.     [provider]  Fluticasone-Salmeterol (ADVAIR) 500-50 MCG/DOSE AEPB Inhale 1 puff into the lungs every 12 (twelve) hours. 12/07/17   [name], MD  insulin regular human CONCENTRATED (HUMULIN R) 500 UNIT/ML injection Use up to 0.3 mL in insulin pump daily.  MDD=0.3 mL equivalent to 150 units of regular insulin 11/03/16   [provider]  isosorbide mononitrate (IMDUR) 60 MG 24 hr tablet Take 60 mg by mouth every morning.    [provider]  latanoprost (XALATAN) 0.005 % ophthalmic solution Place 1 drop into both eyes at bedtime.    [provider]  metolazone (ZAROXOLYN) 2.5 MG tablet Take 1 tablet (2.5 mg total) by mouth 2 (two) times a week. 04/09/17   Richardo Priest, MD  MOVANTIK 25 MG TABS Take 25 mg by mouth  daily.  09/05/14   [provider]  Multiple Vitamins-Minerals (CENTRUM SILVER PO) Take 1 tablet by mouth daily.    [provider]  nitroGLYCERIN (NITROSTAT) 0.4 MG SL tablet Place 0.4 mg under the tongue every 5 (five) minutes as needed. For chest pain    [provider]  OXYCONTIN 20 MG 12 hr tablet Take 20 mg by mouth every 12 (twelve) hours.  07/17/15   [provider]  OXYGEN Inhale into the lungs. 2 liters at bedtime    [provider]  pantoprazole (PROTONIX) 40 MG tablet Take 40 mg by mouth 2 (two) times daily.    [provider]  Polyethyl Glycol-Propyl Glycol (SYSTANE PRESERVATIVE FREE) 0.4-0.3 % SOLN Apply 1 drop to eye 4 (four) times daily as needed (dryness of eye).     [provider]  Potassium Chloride ER 20 MEQ TBCR Take 20 mEq by mouth 3 (three) times daily.    [provider]  predniSONE (DELTASONE) 5 MG tablet Take 1 tablet (5 mg total) by mouth daily with breakfast. 12/07/17   [name], MD  pregabalin (LYRICA) 75 MG capsule Take 1 capsule (75 mg total) by mouth 2 (two) times daily. 01/05/14   Minor, Grace Bushy, NP  torsemide (DEMADEX) 20 MG tablet Take 20 mg by mouth 3 (three) times daily.     [provider]  XARELTO 15 MG TABS tablet TAKE 1 TABLET BY MOUTH ONCE DAILY WITH EVENING MEAL 03/19/17   Richardo Priest, MD    Physical Exam: Vitals:   09/01/18 1944  BP: 117/67  Pulse: 77  Resp: 18  Temp: 98 F (36.7 C)  TempSrc: Oral  Weight: 106.6 kg  Height: 5\' 7"  (1.702 m)      Constitutional: Moderately built and nourished. Vitals:   09/01/18 1944  BP: 117/67  Pulse: 77  Resp: 18  Temp: 98 F (36.7 C)  TempSrc: Oral  Weight: 106.6 kg  Height: 5\' 7"  (1.702 m)   Eyes: Anicteric no pallor. ENMT: No discharge from the ears eyes nose or mouth. Neck: No mass felt.  No neck rigidity. Respiratory: No rhonchi or crepitations. Cardiovascular: S1-S2 heard. Abdomen: Soft nontender bowel  sounds present. Musculoskeletal: Erythema of the right leg extending from the mid calf  up to the right foot.  Gangrenous great toe.  There is also mild discoloration of the left foot great toe. Skin: Gangrenous great toe of the right foot also mild discoloration left foot great toe. Neurologic: Alert awake oriented to time place and person.  Moves all extremities. Psychiatric: Appears normal.  Normal affect.   Labs on Admission: I have personally reviewed following labs and imaging studies  CBC: No results for input(s): WBC, NEUTROABS, HGB, HCT, MCV, PLT in the last 168 hours. Basic Metabolic Panel: No results for input(s): NA, K, CL, CO2, GLUCOSE, BUN, CREATININE, CALCIUM, MG, PHOS in the last 168 hours. GFR: CrCl cannot be calculated (Patient's most recent lab result is older than the maximum 21 days allowed.). Liver Function Tests: No results for input(s): AST, ALT, ALKPHOS, BILITOT, PROT, ALBUMIN in the last 168 hours. No results for input(s): LIPASE, AMYLASE in the last 168 hours. No results for input(s): AMMONIA in the last 168 hours. Coagulation Profile: No results for input(s): INR, PROTIME in the last 168 hours. Cardiac Enzymes: No results for input(s): CKTOTAL, CKMB, CKMBINDEX, TROPONINI in the last 168 hours. BNP (last 3 results) No results for input(s): PROBNP in the last 8760 hours. HbA1C: No results for input(s): HGBA1C in the last 72 hours. CBG: No results for input(s): GLUCAP in the last 168 hours. Lipid Profile: No results for input(s): CHOL, HDL, LDLCALC, TRIG, CHOLHDL, LDLDIRECT in the last 72 hours. Thyroid Function Tests: No results for input(s): TSH, T4TOTAL, FREET4, T3FREE, THYROIDAB in the last 72 hours. Anemia Panel: No results for input(s): VITAMINB12, FOLATE, FERRITIN, TIBC, IRON, RETICCTPCT in the last 72 hours. Urine analysis:    Component Value Date/Time   COLORURINE AMBER (A) 01/30/2014 2134   APPEARANCEUR CLOUDY (A) 01/30/2014 2134   LABSPEC  1.019 01/30/2014 2134   PHURINE 8.0 01/30/2014 2134   GLUCOSEU 250 (A) 01/30/2014 2134   HGBUR NEGATIVE 01/30/2014 2134   BILIRUBINUR NEGATIVE 01/30/2014 2134   KETONESUR NEGATIVE 01/30/2014 2134   PROTEINUR NEGATIVE 01/30/2014 2134   UROBILINOGEN 0.2 01/30/2014 2134   NITRITE NEGATIVE 01/30/2014 2134   LEUKOCYTESUR NEGATIVE 01/30/2014 2134   Sepsis Labs: @LABRCNTIP (procalcitonin:4,lacticidven:4) )No results found for this or any previous visit (from the past 240 hour(s)).   Radiological Exams on Admission: No results found.   Assessment/Plan Active Problems:   Hyperlipidemia   OSA on CPAP   Atrial fibrillation (HCC)   Chronic diastolic heart failure (HCC)   ARF (acute renal failure) (HCC)   Gangrene of toe of right foot (HCC)   Cellulitis of right foot   DM (diabetes mellitus), type 2 with renal complications (HCC)   Gangrene of foot ([place])    1. Right foot cellulitis with gangrenous great toe -discussed with Dr. Donzetta Matters, on-call vascular surgeon will be seeing patient in consult.  We will keep patient n.p.o. past midnight.  And for anticoagulation patient Xarelto will be held and kept patient on heparin.  Patient took his last Xarelto this evening as per the patient.  Patient on empiric antibiotics vancomycin and Primaxin. 2. Staphylococcal bacteremia -patient on vancomycin and Primaxin.  Follow final blood cultures.  As per the discharge summary from [facility] patient 2D echo did not show any vegetations.  Please consult infectious disease. 3. Acute renal failure with creatinine worsening from 1.2-2.2.  Repeat metabolic panel has been ordered.  Patient's diuretics including metolazone and torsemide and also would hold ARB.  Avoid nephrotoxic drugs.  Check vancomycin trough levels. 4. Diabetes mellitus type 2 we will  keep patient on sliding scale coverage. 5. Hypertension on metoprolol hydralazine and Cardizem.  Holding Cozaar due to acute renal failure.  The doses of these  medications were obtained from care everywhere. 6. History of COPD not actively wheezing.  Continue inhalers. 7. Sleep apnea on CPAP. 8. History of CVA on anticoagulation and statins. 9. Peripheral vascular disease with having discolored foot on the left also.   DVT prophylaxis: Heparin infusion. Code Status: Full code. Family Communication: Discussed with patient. Disposition Plan: Home. Consults called: Dr. Donzetta Matters vascular surgeon. Admission status: Inpatient.   Rise Patience MD [facility] Pager [phone].  If 7PM-7AM, please contact night-coverage [email or web address] Password [facility]  09/01/2018, 9:41 PM

## 2018-09-01 NOTE — Progress Notes (Signed)
ANTICOAGULATION CONSULT NOTE - Initial Consult  Pharmacy Consult for Heparin Indication: atrial fibrillation (Xarelto on hold)  Allergies  Allergen Reactions  . Hydrocodone Itching  . Morphine Itching  . Duloxetine Hcl Other (See Comments)  . Codeine Itching  . Penicillins Rash    Patient Measurements: Height: 5\' 7"  (170.2 cm) Weight: 235 lb (106.6 kg) IBW/kg (Calculated) : 66.1 Heparin Dosing Weight: 89.8 kg  Vital Signs: Temp: 98 F (36.7 C) (01/01 1944) Temp Source: Oral (01/01 1944) BP: 117/67 (01/01 1944) Pulse Rate: 77 (01/01 1944)  Labs: No results for input(s): HGB, HCT, PLT, APTT, LABPROT, INR, HEPARINUNFRC, HEPRLOWMOCWT, CREATININE, CKTOTAL, CKMB, TROPONINI in the last 72 hours.  CrCl cannot be calculated (Patient's most recent lab result is older than the maximum 21 days allowed.).   Medical History: Past Medical History:  Diagnosis Date  . Abnormal EKG 11/29/2014  . Acute renal insufficiency 11/29/2014  . Angina   . Arthritis    "knees" (01/18/2014)  . Asthma   . Atrial fibrillation ([place])   . Back pain 01/25/2015  . 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 BOOP (bronchiolitis obliterans with organizing pneumonia) ([place]) 01/20/2014   OLBx 01/20/14 BOOP Started steroids 02/28/14   . Chronic anticoagulation-Xarelto 02/09/2014  . Chronic atrial fibrillation 01/03/2014  . Chronic bronchitis ([place]) 12/21/2013  . Chronic diastolic heart failure ([place]) 02/22/2015  . Chronic pain of both knees 03/18/2017   Overview:  Added automatically from request for surgery [id number]  . Chronic respiratory failure ([place]) 04/10/2015  . COPD (chronic obstructive pulmonary disease) ([place]) 01/25/2015  . Cough    coughing up blood- started last nite, seems to be worse now  . CVA (cerebral vascular accident) ([place]) 02/09/2014  . Dysrhythmia    atrial fib takes cardizem,   . Fibromyalgia   . GERD (gastroesophageal reflux disease)   . H/O hiatal hernia   . Hemoptysis 08/01/2015  . Herpes zoster  06/26/2015  . History of stroke   . Hyperkalemia-repeat pending 11/29/2014  . Hyperlipemia   . Hyperlipidemia   . Hypertension   . Hypothyroidism   . IDDM (insulin dependent diabetes mellitus) (HCC)    insulin pump   . Long term (current) use of anticoagulants 03/21/2014  . Mild CAD 02/20/2015   Overview:  On recent cardiac cath  Overview:  On recent cardiac cath  . Myocardial infarction [facility])    "one dr says yes; another says no"  . Neuromuscular disorder (HCC)    rls, neuropathy  . Neuropathy    rls, neuropathy   . Normal coronary arteries 2011 11/29/2014  . Obesity (BMI 30-39.9)   . On home oxygen therapy    "2L w/CPAP at bedtime" (01/18/2014)  . OSA on CPAP    cpap & oxygen  . Pericardial effusion   . Peritoneal effusion, chronic 02/22/2015   Overview:  With surgical window  . PNA (pneumonia) 04/10/2015  . Pneumonia 5/15   "several times" (01/18/2014)  . Precordial pain 01/26/2015  . Preoperative cardiovascular examination 02/24/2016  . Primary osteoarthritis of both knees 03/18/2017   Overview:  Added automatically from request for surgery [id number]  . Restless leg syndrome   . Sleep apnea    cpap & oxygen   . Stroke [facility]) 2012   denies residual on 01/18/2014  . Troponin level elevated 11/29/2014  . Uncontrolled type 2 diabetes mellitus with diabetic polyneuropathy, with long-term current use of insulin ([place]) 12/21/2013    Medications:  PTA Medlist pending  Assessment: 68 yo M admitted  with wound infection.  Pt has hx of afib, on Xarelto PTA.  Last dose reportedly 1/1 PM, per MD.  Plans to hold Xarelto and initiate heparin.  Do not need to start heparin until 24 hours after last dose of Xarelto.  Will order baseline labs.  Goal of Therapy:  Heparin level 0.3-0.7 units/ml aPTT 66-102 seconds Monitor platelets by anticoagulation protocol: Yes   Plan:  F/U baseline aPTT, heparin level Start heparin 1/2 PM (time depending on last Xarelto dose)  [employer], Pharm.D.,  BCPS Clinical Pharmacist  **Pharmacist phone directory can now be found on [email or web address] ([email or web address]).  Listed under [place].  09/01/2018 10:12 PM

## 2018-09-02 ENCOUNTER — Encounter (HOSPITAL_COMMUNITY): Payer: Self-pay | Admitting: General Practice

## 2018-09-02 ENCOUNTER — Other Ambulatory Visit: Payer: Self-pay

## 2018-09-02 ENCOUNTER — Inpatient Hospital Stay (HOSPITAL_BASED_OUTPATIENT_CLINIC_OR_DEPARTMENT_OTHER): Payer: Medicare Other

## 2018-09-02 DIAGNOSIS — Z885 Allergy status to narcotic agent status: Secondary | ICD-10-CM

## 2018-09-02 DIAGNOSIS — Z87891 Personal history of nicotine dependence: Secondary | ICD-10-CM

## 2018-09-02 DIAGNOSIS — B9562 Methicillin resistant Staphylococcus aureus infection as the cause of diseases classified elsewhere: Secondary | ICD-10-CM

## 2018-09-02 DIAGNOSIS — N179 Acute kidney failure, unspecified: Secondary | ICD-10-CM

## 2018-09-02 DIAGNOSIS — R7881 Bacteremia: Secondary | ICD-10-CM | POA: Diagnosis present

## 2018-09-02 DIAGNOSIS — I480 Paroxysmal atrial fibrillation: Secondary | ICD-10-CM

## 2018-09-02 DIAGNOSIS — Z880 Allergy status to penicillin: Secondary | ICD-10-CM

## 2018-09-02 DIAGNOSIS — I739 Peripheral vascular disease, unspecified: Secondary | ICD-10-CM

## 2018-09-02 DIAGNOSIS — Z9181 History of falling: Secondary | ICD-10-CM

## 2018-09-02 DIAGNOSIS — Z888 Allergy status to other drugs, medicaments and biological substances status: Secondary | ICD-10-CM

## 2018-09-02 DIAGNOSIS — I96 Gangrene, not elsewhere classified: Secondary | ICD-10-CM

## 2018-09-02 LAB — CBC
HCT: 33.7 % — ABNORMAL LOW (ref 39.0–52.0)
HEMOGLOBIN: 11.4 g/dL — AB (ref 13.0–17.0)
MCH: 30 pg (ref 26.0–34.0)
MCHC: 33.8 g/dL (ref 30.0–36.0)
MCV: 88.7 fL (ref 80.0–100.0)
Platelets: 253 10*3/uL (ref 150–400)
RBC: 3.8 MIL/uL — AB (ref 4.22–5.81)
RDW: 14.3 % (ref 11.5–15.5)
WBC: 11 10*3/uL — ABNORMAL HIGH (ref 4.0–10.5)
nRBC: 0 % (ref 0.0–0.2)

## 2018-09-02 LAB — BASIC METABOLIC PANEL
Anion gap: 9 (ref 5–15)
BUN: 27 mg/dL — ABNORMAL HIGH (ref 8–23)
CO2: 25 mmol/L (ref 22–32)
Calcium: 8.4 mg/dL — ABNORMAL LOW (ref 8.9–10.3)
Chloride: 104 mmol/L (ref 98–111)
Creatinine, Ser: 3.33 mg/dL — ABNORMAL HIGH (ref 0.61–1.24)
GFR calc Af Amer: 21 mL/min — ABNORMAL LOW (ref >60)
GFR calc non Af Amer: 18 mL/min — ABNORMAL LOW (ref >60)
Glucose, Bld: 161 mg/dL — ABNORMAL HIGH (ref 70–99)
POTASSIUM: 4.4 mmol/L (ref 3.5–5.1)
Sodium: 138 mmol/L (ref 135–145)

## 2018-09-02 LAB — TYPE AND SCREEN
ABO/RH(D): A POS
ANTIBODY SCREEN: NEGATIVE

## 2018-09-02 LAB — GLUCOSE, CAPILLARY
GLUCOSE-CAPILLARY: 138 mg/dL — AB (ref 70–99)
Glucose-Capillary: 146 mg/dL — ABNORMAL HIGH (ref 70–99)
Glucose-Capillary: 118 mg/dL — ABNORMAL HIGH (ref 70–99)
Glucose-Capillary: 136 mg/dL — ABNORMAL HIGH (ref 70–99)
Glucose-Capillary: 131 mg/dL — ABNORMAL HIGH (ref 70–99)
Glucose-Capillary: 158 mg/dL — ABNORMAL HIGH (ref 70–99)

## 2018-09-02 LAB — SODIUM, URINE, RANDOM: Sodium, Ur: 10 mmol/L

## 2018-09-02 LAB — CREATININE, URINE, RANDOM: Creatinine, Urine: 237.77 mg/dL

## 2018-09-02 MED ORDER — HYDRALAZINE HCL 20 MG/ML IJ SOLN
5.0000 mg | INTRAMUSCULAR | Status: DC | PRN
  Administered 2018-09-07 – 2018-09-08 (×3): 5 mg via INTRAVENOUS
  Filled 2018-09-02 (×4): qty 1

## 2018-09-02 MED ORDER — SODIUM CHLORIDE 0.9 % IV SOLN
1.0000 g | Freq: Two times a day (BID) | INTRAVENOUS | Status: DC
  Administered 2018-09-02 – 2018-09-06 (×9): 1 g via INTRAVENOUS
  Filled 2018-09-02 (×10): qty 1

## 2018-09-02 MED ORDER — VANCOMYCIN HCL 10 G IV SOLR
1750.0000 mg | INTRAVENOUS | Status: DC
  Administered 2018-09-02: 1750 mg via INTRAVENOUS
  Filled 2018-09-02: qty 1750

## 2018-09-02 MED ORDER — SODIUM CHLORIDE 0.9% FLUSH: 10.0000 mL | INTRAVENOUS | Status: DC | PRN

## 2018-09-02 MED ORDER — ALBUTEROL SULFATE (2.5 MG/3ML) 0.083% IN NEBU: 2.5000 mg | INHALATION_SOLUTION | Freq: Four times a day (QID) | RESPIRATORY_TRACT | Status: DC | PRN

## 2018-09-02 MED ORDER — HEPARIN (PORCINE) 25000 UT/250ML-% IV SOLN
1800.0000 [IU]/h | INTRAVENOUS | Status: DC
  Administered 2018-09-02: 1200 [IU]/h via INTRAVENOUS
  Administered 2018-09-03 – 2018-09-04 (×2): 1300 [IU]/h via INTRAVENOUS
  Administered 2018-09-05 (×2): 1450 [IU]/h via INTRAVENOUS
  Administered 2018-09-06 – 2018-09-07 (×3): 1700 [IU]/h via INTRAVENOUS
  Filled 2018-09-02 (×8): qty 250

## 2018-09-02 MED ORDER — IPRATROPIUM BROMIDE 0.02 % IN SOLN: 0.5000 mg | Freq: Four times a day (QID) | RESPIRATORY_TRACT | Status: DC | PRN

## 2018-09-02 MED ORDER — ALBUTEROL SULFATE (2.5 MG/3ML) 0.083% IN NEBU
2.5000 mg | INHALATION_SOLUTION | Freq: Four times a day (QID) | RESPIRATORY_TRACT | Status: DC | PRN
  Administered 2018-09-08: 2.5 mg via RESPIRATORY_TRACT
  Filled 2018-09-02: qty 3

## 2018-09-02 MED ORDER — ENSURE ENLIVE PO LIQD
237.0000 mL | Freq: Two times a day (BID) | ORAL | Status: DC
  Administered 2018-09-03 – 2018-09-10 (×11): 237 mL via ORAL

## 2018-09-02 NOTE — Consult Note (Addendum)
Hospital Consult    Reason for Consult: Right great toe dry gangrene Referring Physician: [facility] MRN #:  [id number]  History of Present Illness: This is a 68 y.o. male with history of atrial fibrillation on chronic anticoagulation.  He is also had 2 strokes in the past but states that he does not have any residual deficits.  He is here for worsening toe gangrene that he states has become black in the past couple days for which he presented to [facility].  He denies any fever at this time.  Risk factors for vascular disease include family history and uncontrolled diabetes.  Past Medical History:  Diagnosis Date  . Abnormal EKG 11/29/2014  . Acute renal insufficiency 11/29/2014  . Angina   . Arthritis    "knees" (01/18/2014)  . Asthma   . Atrial fibrillation ([place])   . Back pain 01/25/2015  . 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 BOOP (bronchiolitis obliterans with organizing pneumonia) ([place]) 01/20/2014   OLBx 01/20/14 BOOP Started steroids 02/28/14   . Chronic anticoagulation-Xarelto 02/09/2014  . Chronic atrial fibrillation 01/03/2014  . Chronic bronchitis ([place]) 12/21/2013  . Chronic diastolic heart failure ([place]) 02/22/2015  . Chronic pain of both knees 03/18/2017   Overview:  Added automatically from request for surgery [id number]  . Chronic respiratory failure ([place]) 04/10/2015  . COPD (chronic obstructive pulmonary disease) ([place]) 01/25/2015  . Cough    coughing up blood- started last nite, seems to be worse now  . CVA (cerebral vascular accident) ([place]) 02/09/2014  . Dysrhythmia    atrial fib takes cardizem,   . Fibromyalgia   . GERD (gastroesophageal reflux disease)   . H/O hiatal hernia   . Hemoptysis 08/01/2015  . Herpes zoster 06/26/2015  . History of stroke   . Hyperkalemia-repeat pending 11/29/2014  . Hyperlipemia   . Hyperlipidemia   . Hypertension   . Hypothyroidism   . IDDM (insulin dependent diabetes mellitus) (HCC)    insulin pump   . Long term (current) use  of anticoagulants 03/21/2014  . Mild CAD 02/20/2015   Overview:  On recent cardiac cath  Overview:  On recent cardiac cath  . Myocardial infarction [facility])    "one dr says yes; another says no"  . Neuromuscular disorder (HCC)    rls, neuropathy  . Neuropathy    rls, neuropathy   . Normal coronary arteries 2011 11/29/2014  . Obesity (BMI 30-39.9)   . On home oxygen therapy    "2L w/CPAP at bedtime" (01/18/2014)  . OSA on CPAP    cpap & oxygen  . Pericardial effusion   . Peritoneal effusion, chronic 02/22/2015   Overview:  With surgical window  . PNA (pneumonia) 04/10/2015  . Pneumonia 5/15   "several times" (01/18/2014)  . Precordial pain 01/26/2015  . Preoperative cardiovascular examination 02/24/2016  . Primary osteoarthritis of both knees 03/18/2017   Overview:  Added automatically from request for surgery [id number]  . Restless leg syndrome   . Sleep apnea    cpap & oxygen   . Stroke [facility]) 2012   denies residual on 01/18/2014  . Troponin level elevated 11/29/2014  . Uncontrolled type 2 diabetes mellitus with diabetic polyneuropathy, with long-term current use of insulin ([place]) 12/21/2013    Past Surgical History:  Procedure Laterality Date  . APPENDECTOMY    . CARDIAC CATHETERIZATION  X 2 then 03/03/2012    NL LVF, normal coronaries, vessels are small ([facility]: Dr. Beatrix Fetters)  . CATARACT EXTRACTION W/ INTRAOCULAR LENS  IMPLANT, BILATERAL Bilateral   . CHOLECYSTECTOMY    . HERNIA REPAIR     UHR  . LAPAROSCOPIC GASTRIC BANDING  2010  . PERICARDIAL WINDOW Left 01/24/2014   Procedure: PERICARDIAL WINDOW;  Surgeon: Gaye Pollack, MD;  Location: [facility];  Service: Thoracic;  Laterality: Left;  . SINUS EXPLORATION  X 2  . UMBILICAL HERNIA REPAIR    . VIDEO ASSISTED THORACOSCOPY Left 01/24/2014   Procedure: VIDEO ASSISTED THORACOSCOPY;  Surgeon: Gaye Pollack, MD;  Location: [facility] OR;  Service: Thoracic;  Laterality: Left;  VATS/open lung biopsy  . VIDEO BRONCHOSCOPY Bilateral 12/29/2013   Procedure:  VIDEO BRONCHOSCOPY WITHOUT FLUORO;  Surgeon: Elsie Stain, MD;  Location: [facility];  Service: Endoscopy;  Laterality: Bilateral;  . VIDEO BRONCHOSCOPY N/A 01/24/2014   Procedure: VIDEO BRONCHOSCOPY;  Surgeon: Gaye Pollack, MD;  Location: [facility] OR;  Service: Thoracic;  Laterality: N/A;    Allergies  Allergen Reactions  . Hydrocodone Itching  . Morphine Itching  . Duloxetine Hcl Other (See Comments)  . Codeine Itching  . Penicillins Itching and Rash    DID THE REACTION INVOLVE: Swelling of the face/tongue/throat, SOB, or low BP? No Sudden or severe rash/hives, skin peeling, or the inside of the mouth or nose? No Did it require medical treatment? No When did it last happen?unknown If all above answers are "NO", may proceed with cephalosporin use.     Prior to Admission medications   Medication Sig Start Date End Date Taking? Authorizing Provider  albuterol (PROVENTIL HFA;VENTOLIN HFA) 108 (90 BASE) MCG/ACT inhaler Inhale 2 puffs into the lungs every 6 (six) hours as needed for wheezing or shortness of breath.   Yes [provider]  albuterol (PROVENTIL) (2.5 MG/3ML) 0.083% nebulizer solution Take 3 mLs (2.5 mg total) by nebulization 2 (two) times daily. And as needed 05/03/15  Yes Parrett, Tammy S, NP  atorvastatin (LIPITOR) 40 MG tablet Take 40 mg by mouth daily.   Yes [provider]  azelastine (ASTELIN) 137 MCG/SPRAY nasal spray Place 1 spray into both nostrils at bedtime as needed for allergies.    Yes [provider]  diclofenac (VOLTAREN) 50 MG EC tablet Take 50 mg by mouth 2 (two) times daily.   Yes [provider]  diltiazem (CARDIZEM) 120 MG tablet Take 120 mg by mouth daily. 11/10/16  Yes [provider]  ferrous sulfate 325 (65 FE) MG tablet Take 325 mg by mouth daily.   Yes [provider]  fluticasone (FLONASE) 50 MCG/ACT nasal spray Place 1 spray into both nostrils daily as needed for allergies.    Yes [provider]  Fluticasone-Salmeterol (ADVAIR) 500-50 MCG/DOSE AEPB Inhale 1 puff into the lungs every 12 (twelve) hours. 12/07/17  Yes [name], MD  hydrALAZINE (APRESOLINE) 100 MG tablet Take 100 mg by mouth at bedtime.   Yes [provider]  hydrALAZINE (APRESOLINE) 50 MG tablet Take 50 mg by mouth every morning.   Yes [provider]  isosorbide mononitrate (IMDUR) 60 MG 24 hr tablet Take 60 mg by mouth every morning.   Yes [provider]  losartan (COZAAR) 100 MG tablet Take 100 mg by mouth daily.   Yes [provider]  metFORMIN (GLUCOPHAGE) 1000 MG tablet Take 1,000 mg by mouth 2 (two) times daily with a meal.   Yes [provider]  metolazone (ZAROXOLYN) 2.5 MG tablet Take 1 tablet (2.5 mg total) by mouth 2 (two) times a week. Patient taking differently:  Take 2.5 mg by mouth every Monday, Wednesday, and Friday.  04/09/17  Yes Richardo Priest, MD  metoprolol succinate (TOPROL-XL) 25 MG 24 hr tablet Take 25 mg by mouth daily. 11/19/17  Yes [provider]  MOVANTIK 25 MG TABS Take 25 mg by mouth daily.  09/05/14  Yes [provider]  nitroGLYCERIN (NITROSTAT) 0.4 MG SL tablet Place 0.4 mg under the tongue every 5 (five) minutes as needed for chest pain.    Yes [provider]  oxyCODONE ER 18 MG C12A Take 18 mg by mouth every 12 (twelve) hours. 05/18/18  Yes [provider]  OXYGEN Inhale into the lungs. 2 liters at bedtime   Yes [provider]  pantoprazole (PROTONIX) 40 MG tablet Take 40 mg by mouth daily.    Yes [provider]  Polyethyl Glycol-Propyl Glycol (SYSTANE PRESERVATIVE FREE) 0.4-0.3 % SOLN Apply 1 drop to eye 4 (four) times daily as needed (dryness of eye).    Yes [provider]  Potassium Chloride ER 20 MEQ TBCR Take 20 mEq by mouth 3 (three) times daily.   Yes [provider]  pramipexole (MIRAPEX) 1.5 MG tablet Take 1.5 mg by mouth at bedtime.   Yes [provider]  pregabalin (LYRICA) 75 MG capsule Take 1 capsule (75 mg total) by mouth 2 (two) times daily. 01/05/14  Yes Minor, Grace Bushy, NP  ranolazine (RANEXA) 500 MG 12 hr tablet Take 500 mg by mouth 2 (two) times daily.   Yes [provider]  torsemide (DEMADEX) 20 MG tablet Take 20 mg by mouth 2 (two) times daily.    Yes [provider]  XARELTO 15 MG TABS tablet TAKE 1 TABLET BY MOUTH ONCE DAILY WITH EVENING MEAL Patient taking differently: Take 15 mg by mouth daily after supper.  03/19/17  Yes Richardo Priest, MD  famotidine (PEPCID) 20 MG tablet TAKE 1 TABLET BY MOUTH AT BEDTIME Patient not taking: Reported on 09/02/2018 01/17/15   Elsie Stain, MD    Social History   Socioeconomic History  . Marital status: Married    Spouse name: Not on file  . Number of children: Not on file  . Years of education: Not on file  . Highest education level: Not on file  Occupational History  . Not on file  Social Needs  . Financial resource strain: Not on file  . Food insecurity:    Worry: Not on file    Inability: Not on file  . Transportation needs:    Medical: Not on file    Non-medical: Not on file  Tobacco Use  . Smoking status: Former Smoker    Types: Cigars    Last attempt to quit: 09/01/2006    Years since quitting: 12.0  . Smokeless tobacco: Never Used  Substance and Sexual Activity  . 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 Drug use: No  . Sexual activity: Never  Lifestyle  . Physical activity:    Days per week: Not on file    Minutes per session: Not on file  . Stress: Not on file  Relationships  . Social connections:    Talks on phone: Not on file    Gets together: Not on file    Attends religious service: Not on file    Active member of club or organization: Not on file    Attends meetings of clubs or organizations: Not on file  Relationship status: Not on file  . Intimate partner violence:    Fear of  current or ex partner: Not on file    Emotionally abused: Not on file    Physically abused: Not on file    Forced sexual activity: Not on file  Other Topics Concern  . Not on file  Social History Narrative  . Not on file     Family History  Problem Relation Age of Onset  . Cancer Mother   . Stroke Father   . Heart disease Father   . Seizures Sister   . Diabetes Sister   . Anesthesia problems Neg Hx   . Hypotension Neg Hx   . Malignant hyperthermia Neg Hx   . Pseudochol deficiency Neg Hx     ROS:  Cardiovascular: []  chest pain/pressure []  palpitations []  SOB lying flat []  DOE []  pain in legs while walking []  pain in legs at rest []  pain in legs at night []  non-healing ulcers []  hx of DVT []  swelling in legs  Pulmonary: []  productive cough []  asthma/wheezing []  home O2  Neurologic: []  weakness in []  arms []  legs []  numbness in []  arms []  legs []  hx of CVA []  mini stroke [] difficulty speaking or slurred speech []  temporary loss of vision in one eye []  dizziness  Hematologic: []  hx of cancer []  bleeding problems []  problems with blood clotting easily  Endocrine:   [x]  diabetes []  thyroid disease  GI []  vomiting blood []  blood in stool  GU: []  CKD/renal failure []  HD--[]  M/W/F or []  T/T/S []  burning with urination []  blood in urine  Psychiatric: []  anxiety []  depression  Musculoskeletal: []  arthritis []  joint pain  Integumentary: []  rashes [x]  ulcers  Constitutional: []  fever []  chills   Physical Examination  Vitals:   09/02/18 0418 09/02/18 0905  BP: (!) 96/45   Pulse: (!) 47   Resp: 18   Temp: 98.8 F (37.1 C)   SpO2: 96% 97%   Body mass index is 36.81 kg/m.  General:  WDWN in NAD HENT: WNL, normocephalic Pulmonary: normal non-labored breathing Cardiac: Palpable femoral pulses bilaterally, I cannot palpate any pulses distally to this Abdomen: soft, NT/ND, no masses Extremities: Right great toe is completely dry  gangrenous.  There is toe tip ulceration on the left great toe Neurologic: A&O X 3; Appropriate Affect ; SENSATION: normal; MOTOR FUNCTION:  moving all extremities equally. Speech is fluent/normal   CBC    Component Value Date/Time   WBC 11.0 (H) 09/02/2018 0351   RBC 3.80 (L) 09/02/2018 0351   HGB 11.4 (L) 09/02/2018 0351   HCT 33.7 (L) 09/02/2018 0351   PLT 253 09/02/2018 0351   MCV 88.7 09/02/2018 0351   MCH 30.0 09/02/2018 0351   MCHC 33.8 09/02/2018 0351   RDW 14.3 09/02/2018 0351   LYMPHSABS 1.1 09/01/2018 2157   MONOABS 0.8 09/01/2018 2157   EOSABS 0.0 09/01/2018 2157   BASOSABS 0.0 09/01/2018 2157    BMET    Component Value Date/Time   NA 138 09/02/2018 0351   K 4.4 09/02/2018 0351   CL 104 09/02/2018 0351   CO2 25 09/02/2018 0351   GLUCOSE 161 (H) 09/02/2018 0351   BUN 27 (H) 09/02/2018 0351   CREATININE 3.33 (H) 09/02/2018 0351   CALCIUM 8.4 (L) 09/02/2018 0351   GFRNONAA 18 (L) 09/02/2018 0351   GFRAA 21 (L) 09/02/2018 0351    COAGS: Lab Results  Component Value Date   INR 1.68 09/01/2018  INR 1.04 01/30/2014   INR 0.98 01/18/2014     Non-Invasive Vascular Imaging:   ABI right 1.1 left 1.5  ASSESSMENT/PLAN: This is a 68 y.o. male with diabetes and history of CVA x2 now presents with frankly gangrenous right great toe with an ulceration of the tip of the left great toe and palpable common femoral pulses.  Duplex the outside hospital did not demonstrate any occlusions of his SFA or tibial arteries with noncompressible ABIs consistent with his uncontrolled diabetes.  He was given Xarelto yesterday for his atrial fibrillation.  We will plan angiogram tomorrow although Cr is 3.3 and will likely need hydration and possibly reschedule for next week if we can improve renal function with gentle hydration.  Please make patient n.p.o. past midnight.  He can be on heparin drip if needed for his atrial fibrillation but hold oral anticoagulants at this time. ABI  ordered.   Sadat Sliwa C. Donzetta Matters, MD [facility] Office: [phone] Pager: [phone]

## 2018-09-02 NOTE — Progress Notes (Signed)
PROGRESS NOTE                                                                                                                                                                                                             Patient Demographics:    Jon [name], is a 68 y.o. male, DOB - Nov 25, 1950, NAT:[id number]  Admit date - 09/01/2018   Admitting Physician Antonieta Pert, MD  Outpatient Primary MD for the patient is Cox, Elnita Maxwell, MD  LOS - 1   No chief complaint on file.      Brief Narrative   68 y.o. male with history of diabetes mellitus type 2, atrial fibrillation, hypertension, COPD, sleep apnea , admitted at [facility] for gangrene of his right great toe.  His blood culture grew MRSA, as well he was noted to have AKI, creatinine was 2.2 on admission, from baseline 1.2, had poor Doppler signals of the lower extremities, so he was transferred to [facility] for evaluation by vascular surgery.    Subjective:    Jon [name] today has, No headache, No chest pain, No abdominal pain - No Nausea, does report he is unable to void.   Assessment  & Plan :    Principal Problem:   Cellulitis of right foot Active Problems:   Hyperlipidemia   OSA on CPAP   Atrial fibrillation (HCC)   Chronic diastolic heart failure (HCC)   ARF (acute renal failure) (HCC)   Gangrene of toe of right foot ([place])   DM (diabetes mellitus), type 2 with renal complications (HCC)   Gangrene of foot (HCC)   Right foot cellulitis with gangrenous great toe  -Vascular surgery consult greatly appreciated, awaiting further recommendation,  -For now hold Xarelto, continue with heparin GTT, and anticipation for surgical intervention  -Continue with IV vancomycin and Primaxin   MRSA bacteremia  -patient on vancomycin and Primaxin.  Follow final blood cultures.  As per the discharge summary from [facility] patient 2D echo did not show any vegetations. -Infectious disease  consulted  Acute renal failure -Plan creatinine, trending up, 2.2 on admission, it is 3.3 today, continue to hold diuresis including metolazone and torsemide, as well continue to hold ARB, avoid nephrotoxic medications. -  with creatinine worsening from 1.2-2.2.  Repeat metabolic panel has been ordered.  Patient's diuretics including metolazone and torsemide and  also would hold ARB.  Avoid nephrotoxic drugs.  Mycin trough level was within normal limits at the [facility] yesterday, his CT lower extremity was without contrast.  -Urinalysis, check urine sodium, inserted Foley catheter as he did have some retention.  Diabetes mellitus type 2  - we will keep patient on sliding scale coverage  Paroxysmal atrial fibrillation with slow ventricular response -Heart rate was in the low 40s and high 30s overnight, I have stopped Cardizem, will hold metoprolol this morning and reassess -Continue with heparin GTT, Xarelto on hold  Hypertension  - on metoprolol,  hydralazine and Cardizem.  Holding Cozaar due to acute renal failure.  -Holding Cardizem for bradycardia  History of COPD  - not actively wheezing.  Continue inhalers.  Sleep apnea on CPAP.  History of nonhemorrhagic CVA on anticoagulation and statins.  Peripheral vascular disease with having discolored foot on the left also. -Vascular surgery following    Code Status : Full  Family Communication  : Wife at bedside  Disposition Plan  : Pending further work-up  Consults  :  Vascular surgery  Procedures  : none  DVT Prophylaxis  :  Heparin GTT  Lab Results  Component Value Date   PLT 253 09/02/2018    Antibiotics  :    Anti-infectives (From admission, onward)   Start     Dose/Rate Route Frequency Ordered Stop   09/02/18 1000  meropenem (MERREM) 1 g in sodium chloride 0.9 % 100 mL IVPB     1 g 200 mL/hr over 30 Minutes Intravenous Every 12 hours 09/02/18 0017     09/02/18 0030  vancomycin (VANCOCIN) 1,750 mg in  sodium chloride 0.9 % 500 mL IVPB     1,750 mg 250 mL/hr over 120 Minutes Intravenous Every 48 hours 09/02/18 0017     09/01/18 2300  vancomycin (VANCOCIN) 2,000 mg in sodium chloride 0.9 % 500 mL IVPB  Status:  Discontinued     2,000 mg 250 mL/hr over 120 Minutes Intravenous  Once 09/01/18 2215 09/01/18 2353   09/01/18 2230  meropenem (MERREM) 2 g in sodium chloride 0.9 % 100 mL IVPB     2 g 200 mL/hr over 30 Minutes Intravenous  Once 09/01/18 2215 09/01/18 2346        Objective:   Vitals:   09/01/18 1944 09/01/18 2218 09/02/18 0418 09/02/18 0905  BP: 117/67 115/65 (!) 96/45   Pulse: 77 89 (!) 47   Resp: 18  18   Temp: 98 F (36.7 C)  98.8 F (37.1 C)   TempSrc: Oral  Oral   SpO2:   96% 97%  Weight: 106.6 kg     Height: 5\' 7"  (1.702 m)       Wt Readings from Last 3 Encounters:  09/01/18 106.6 kg  12/07/17 124.7 kg  05/25/17 128.5 kg     Intake/Output Summary (Last 24 hours) at 09/02/2018 1110 Last data filed at 09/02/2018 0600 Gross per 24 hour  Intake 500 ml  Output 200 ml  Net 300 ml     Physical Exam  Awake Alert, Oriented X 3, No new F.N deficits, Normal affectd.  Symmetrical Chest wall movement, Good air movement bilaterally, CTAB IRR IRR,No Gallops,Rubs or new Murmurs, No Parasternal Heave +ve B.Sounds, Abd Soft, No tenderness,  No rebound - guarding or rigidity. No Cyanosis, Clubbing or edema, erythema of the right foot, extending from the mid calf up to the right foot, with gangrenous right great toe    Data  Review:    CBC Recent Labs  Lab 09/01/18 2157 09/02/18 0351  WBC 12.4* 11.0*  HGB 12.7* 11.4*  HCT 38.5* 33.7*  PLT 314 253  MCV 87.9 88.7  MCH 29.0 30.0  MCHC 33.0 33.8  RDW 14.1 14.3  LYMPHSABS 1.1  --   MONOABS 0.8  --   EOSABS 0.0  --   BASOSABS 0.0  --     Chemistries  Recent Labs  Lab 09/01/18 2157 09/02/18 0351  NA 139 138  K 4.4 4.4  CL 102 104  CO2 25 25  GLUCOSE 147* 161*  BUN 34* 27*  CREATININE 2.95* 3.33*    CALCIUM 8.8* 8.4*  AST 58*  --   ALT 32  --   ALKPHOS 137*  --   BILITOT 1.5*  --    ------------------------------------------------------------------------------------------------------------------ No results for input(s): CHOL, HDL, LDLCALC, TRIG, CHOLHDL, LDLDIRECT in the last 72 hours.  Lab Results  Component Value Date   HGBA1C 8.1 (H) 01/31/2014   ------------------------------------------------------------------------------------------------------------------ No results for input(s): TSH, T4TOTAL, T3FREE, THYROIDAB in the last 72 hours.  Invalid input(s): FREET3 ------------------------------------------------------------------------------------------------------------------ No results for input(s): VITAMINB12, FOLATE, FERRITIN, TIBC, IRON, RETICCTPCT in the last 72 hours.  Coagulation profile Recent Labs  Lab 09/01/18 2157  INR 1.68    No results for input(s): DDIMER in the last 72 hours.  Cardiac Enzymes No results for input(s): CKMB, TROPONINI, MYOGLOBIN in the last 168 hours.  Invalid input(s): CK ------------------------------------------------------------------------------------------------------------------    Component Value Date/Time   BNP 66.7 11/29/2014 1410    Inpatient Medications  Scheduled Meds: . atorvastatin  40 mg Oral q1800  . diltiazem  120 mg Oral Daily  . insulin aspart  0-9 Units Subcutaneous Q4H  . metoprolol succinate  25 mg Oral Daily  . mometasone-formoterol  2 puff Inhalation BID   Continuous Infusions: . heparin    . meropenem (MERREM) IV    . vancomycin 1,750 mg (09/02/18 0040)   PRN Meds:.acetaminophen **OR** acetaminophen, albuterol, fentaNYL (SUBLIMAZE) injection, hydrALAZINE, ipratropium, nitroGLYCERIN, ondansetron **OR** ondansetron (ZOFRAN) IV  Micro Results No results found for this or any previous visit (from the past 240 hour(s)).  Radiology Reports No results found.   Phillips Climes M.D on 09/02/2018 at  11:10 AM  Between 7am to 7pm - Pager - [phone]  After 7pm go to [email or web address] - password [email or web address]  [facility] -  Office  [phone]

## 2018-09-02 NOTE — Progress Notes (Signed)
Pharmacy Antibiotic Note  Jon [name] is a 68 y.o. male admitted on 09/01/2018 with foot wound.  Pharmacy has been consulted for Vancomycin/Merrem dosing. Pt is a transfer from [facility] where he was on Vancomycin and Primaxin. Noted renal dysfunction (will not use AUC dosing).   Vancomycin/Primaxin doses at [facility] 12/30: Vancomycin 2000 mg x 1 @ ~2100 12/31: Vancomycin 2000 mg x 1 @ ~1700 1/1: No vancomycin given due to transfer  Last dose of Primaxin was 1/1 @ ~1200  Plan: Vancomycin 1750 mg IV q48h to start now Merrem 1000 mg IV q12h Trend WBC, temp, renal function  F/U infectious work-up Drug levels as indicated   Height: 5\' 7"  (170.2 cm) Weight: 235 lb (106.6 kg) IBW/kg (Calculated) : 66.1  Temp (24hrs), Avg:98 F (36.7 C), Min:98 F (36.7 C), Max:98 F (36.7 C)  Recent Labs  Lab 09/01/18 2157  WBC 12.4*  CREATININE 2.95*    Estimated Creatinine Clearance: 27.5 mL/min (A) (by C-G formula based on SCr of 2.95 mg/dL (H)).    Allergies  Allergen Reactions  . Hydrocodone Itching  . Morphine Itching  . Duloxetine Hcl Other (See Comments)  . Codeine Itching  . Penicillins Rash     Narda Bonds 09/02/2018 12:06 AM

## 2018-09-02 NOTE — Progress Notes (Signed)
ABI's have been completed. Preliminary results can be found in CV Proc through chart review.   09/02/18 3:29 PM Jon [name] RVT

## 2018-09-02 NOTE — Consult Note (Addendum)
[facility]    Date of Admission:  09/01/2018           Day 4 vancomycin        Day 4 Carbapenem       Reason for Consult: Automatic consultation for MRSA bacteremia     Assessment: He has severe soft tissue infection of his right great toe complicated by MRSA bacteremia.  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  I agree with current therapy with vancomycin and meropenem.  I will order repeat blood cultures and recommend a TEE in addition to his coming vascular evaluation.  Plan: 1. Continue current antibiotics 2. Repeat blood cultures 3. Hold off on PICC placement until blood cultures are negative 4. Recommend TEE  Principal Problem:   MRSA bacteremia Active Problems:   Gangrene of toe of right foot ([place])   Hyperlipidemia   OSA on CPAP   Atrial fibrillation (HCC)   Chronic diastolic heart failure (HCC)   ARF (acute renal failure) (HCC)   Cellulitis of right foot   DM (diabetes mellitus), type 2 with renal complications (HCC)   Scheduled Meds: . atorvastatin  40 mg Oral q1800  . insulin aspart  0-9 Units Subcutaneous Q4H  . metoprolol succinate  25 mg Oral Daily  . mometasone-formoterol  2 puff Inhalation BID   Continuous Infusions: . heparin    . meropenem (MERREM) IV Stopped (09/02/18 1339)  . vancomycin 1,750 mg (09/02/18 0040)   PRN Meds:.acetaminophen **OR** acetaminophen, albuterol, fentaNYL (SUBLIMAZE) injection, hydrALAZINE, ipratropium, nitroGLYCERIN, ondansetron **OR** ondansetron (ZOFRAN) IV, sodium chloride flush  HPI: Jon [name] is a 68 y.o. male with multiple medical problems who has had problems with right great toe ulceration for several months.  He was initially followed at the wound center in [place] until his wife died this past 28-Mar-2023.  He transferred to the wound center in [place].  When he went in for a routine visit on 08/30/2018 he was told to go  immediately to the emergency room at [facility] where he was admitted.  Gangrene of his right great toe.  He reports fever and chills.  Admission blood cultures that grew MRSA.  CT scan did not reveal any evidence of deep abscess or osteomyelitis.  He underwent a TTE which was a poor quality limited study.  It did not reveal any evidence of endocarditis.  He was transferred for vascular evaluation and further management.  He says that he is feeling much better over the past few day.   Review of Systems: Review of Systems  Constitutional: Positive for chills, fever and malaise/fatigue. Negative for diaphoresis and weight loss.  Respiratory: Negative for cough.   Cardiovascular: Negative for chest pain.  Gastrointestinal: Negative for abdominal pain, diarrhea, nausea and vomiting.  Genitourinary: Negative for dysuria.  Musculoskeletal: Negative for back pain and joint pain.  Skin: Negative for rash.  Neurological:       He reports multiple recent falls.  He has been living alone in a trailer and [place] since his wife died.    Past Medical History:  Diagnosis Date  . Abnormal EKG 11/29/2014  . Acute renal insufficiency 11/29/2014  . Angina   . Arthritis    "knees" (01/18/2014)  . Asthma   . Atrial fibrillation ([place])   . Back pain 01/25/2015  . 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 Chronic anticoagulation-Xarelto 02/09/2014  . Chronic atrial fibrillation 01/03/2014  . Chronic bronchitis ([place]) 12/21/2013  . Chronic diastolic heart failure ([place]) 02/22/2015  . Chronic pain of both knees 03/18/2017   Overview:  Added automatically from request for surgery [id number]  . Chronic respiratory failure ([place]) 04/10/2015  . COPD (chronic obstructive pulmonary disease) ([place]) 01/25/2015  . Cough    coughing up blood- started last nite, seems to be worse now  . CVA (cerebral vascular  accident) ([place]) 02/09/2014  . Dysrhythmia    atrial fib takes cardizem,   . Fibromyalgia   . GERD (gastroesophageal reflux disease)   . H/O hiatal hernia   . Hemoptysis 08/01/2015  . Herpes zoster 06/26/2015  . History of stroke   . Hyperkalemia-repeat pending 11/29/2014  . Hyperlipemia   . Hyperlipidemia   . Hypertension   . Hypothyroidism   . IDDM (insulin dependent diabetes mellitus) (HCC)    insulin pump   . Long term (current) use of anticoagulants 03/21/2014  . Mild CAD 02/20/2015   Overview:  On recent cardiac cath  Overview:  On recent cardiac cath  . Myocardial infarction [facility])    "one dr says yes; another says no"  . Neuromuscular disorder (HCC)    rls, neuropathy  . Neuropathy    rls, neuropathy   . Normal coronary arteries 2011 11/29/2014  . Obesity (BMI 30-39.9)   . On home oxygen therapy    "2L w/CPAP at bedtime" (01/18/2014)  . OSA on CPAP    cpap & oxygen  . Pericardial effusion   . Peritoneal effusion, chronic 02/22/2015   Overview:  With surgical window  . PNA (pneumonia) 04/10/2015  . Pneumonia 5/15   "several times" (01/18/2014)  . Precordial pain 01/26/2015  . Preoperative cardiovascular examination 02/24/2016  . Primary osteoarthritis of both knees 03/18/2017   Overview:  Added automatically from request for surgery [id number]  . Restless leg syndrome   . Sleep apnea    cpap & oxygen   . Stroke [facility]) 2012   denies residual on 01/18/2014  . Troponin level elevated 11/29/2014  . Uncontrolled type 2 diabetes mellitus with diabetic polyneuropathy, with long-term current use of insulin ([place]) 12/21/2013    Social History   Tobacco Use  . Smoking status: Former Smoker    Types: Cigars    Last attempt to quit: 09/01/2006    Years since quitting: 12.0  . Smokeless tobacco: Never Used  Substance Use Topics  . 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 Drug use: No    Family History  Problem Relation Age of Onset  . Cancer  Mother   . Stroke Father   . Heart disease Father   . Seizures Sister   . Diabetes Sister   . Anesthesia problems Neg Hx   . Hypotension Neg Hx   . Malignant hyperthermia Neg Hx   . Pseudochol deficiency Neg Hx    Allergies  Allergen Reactions  . Hydrocodone Itching  . Morphine Itching  . Duloxetine Hcl Other (See Comments)  . Codeine Itching  . Penicillins Itching and Rash    DID THE REACTION INVOLVE: Swelling of the face/tongue/throat, SOB, or low BP? No Sudden or severe rash/hives, skin peeling, or the inside of the mouth or nose? No Did it require medical treatment? No When did it last happen?unknown If all above answers  are "NO", may proceed with cephalosporin use.     OBJECTIVE: Blood pressure (!) 96/57, pulse (!) 44, temperature 98.8 F (37.1 C), temperature source Oral, resp. rate 16, height 5\' 7"  (1.702 m), weight 106.6 kg, SpO2 97 %.  Physical Exam Constitutional:      Comments: He is resting quietly in bed.  He is pleasant and talkative.  Cardiovascular:     Heart sounds: No murmur.     Comments: He has very distant heart sounds. Pulmonary:     Effort: Pulmonary effort is normal.     Breath sounds: Normal breath sounds.  Abdominal:     Palpations: Abdomen is soft.     Tenderness: There is no abdominal tenderness.  Musculoskeletal:     Comments: He has dry, black gangrene of his entire right great toe.  There is no drainage or odor.  Psychiatric:        Mood and Affect: Mood normal.     Lab Results Lab Results  Component Value Date   WBC 11.0 (H) 09/02/2018   HGB 11.4 (L) 09/02/2018   HCT 33.7 (L) 09/02/2018   MCV 88.7 09/02/2018   PLT 253 09/02/2018    Lab Results  Component Value Date   CREATININE 3.33 (H) 09/02/2018   BUN 27 (H) 09/02/2018   NA 138 09/02/2018   K 4.4 09/02/2018   CL 104 09/02/2018   CO2 25 09/02/2018    Lab Results  Component Value Date   ALT 32 09/01/2018   AST 58 (H) 09/01/2018   ALKPHOS 137 (H) 09/01/2018    BILITOT 1.5 (H) 09/01/2018     Microbiology: No results found for this or any previous visit (from the past 240 hour(s)).  Michel Bickers, MD [facility] for [facility] [phone] pager   [phone] cell 09/02/2018, 4:58 PM

## 2018-09-02 NOTE — Progress Notes (Signed)
[facility] Infusion Coordinator will follow pt with ID team to support home infusion pharmacy services if needed at DC.  If patient discharges after hours, please call [phone].   Larry Sierras 09/02/2018, 3:14 PM

## 2018-09-02 NOTE — Progress Notes (Signed)
Pt HR in High 30s to low 40s. Upon assessment of patient he was in bed resting comfortably. This RN woke pt up and asked if he was feeling okay or had any pain, patient stated "yes, I'm fine just trying to get situated". Pt asymptomatic and telemetry now reading HR 58-60. Schorr, NP paged and made aware; continue to monitor pt and if symptomatic page again.

## 2018-09-02 NOTE — Progress Notes (Signed)
Peripherally Inserted Central Catheter/Midline Placement  The IV Nurse has discussed with the patient and/or persons authorized to consent for the patient, the purpose of this procedure and the potential benefits and risks involved with this procedure.  The benefits include less needle sticks, lab draws from the catheter, and the patient may be discharged home with the catheter. Risks include, but not limited to, infection, bleeding, blood clot (thrombus formation), and puncture of an artery; nerve damage and irregular heartbeat and possibility to perform a PICC exchange if needed/ordered by physician.  Alternatives to this procedure were also discussed.  B[facility] Power PICC patient education guide, fact sheet on infection prevention and patient information card has been provided to patient /or left at bedside.    PICC/Midline Placement Documentation    Midline placed at the bedside by Audley Hose RN    Jon [name] 09/02/2018, 12:59 PM

## 2018-09-02 NOTE — Progress Notes (Signed)
ANTICOAGULATION CONSULT NOTE - Initial Consult  Pharmacy Consult for Heparin (Xarelto on hold) Indication: atrial fibrillation  Allergies  Allergen Reactions  . Hydrocodone Itching  . Morphine Itching  . Duloxetine Hcl Other (See Comments)  . Codeine Itching  . Penicillins Rash   Patient Measurements: Height: 5\' 7"  (170.2 cm) Weight: 235 lb (106.6 kg) IBW/kg (Calculated) : 66.1  Vital Signs: Temp: 98 F (36.7 C) (01/01 1944) Temp Source: Oral (01/01 1944) BP: 115/65 (01/01 2218) Pulse Rate: 89 (01/01 2218)  Labs: Recent Labs    09/01/18 2157 09/01/18 2229  HGB 12.7*  --   HCT 38.5*  --   PLT 314  --   APTT  --  47*  LABPROT 19.6*  --   INR 1.68  --   HEPARINUNFRC  --  >2.20*  CREATININE 2.95*  --     Estimated Creatinine Clearance: 27.5 mL/min (A) (by C-G formula based on SCr of 2.95 mg/dL (H)).   Medical History: Past Medical History:  Diagnosis Date  . Abnormal EKG 11/29/2014  . Acute renal insufficiency 11/29/2014  . Angina   . Arthritis    "knees" (01/18/2014)  . Asthma   . Atrial fibrillation ([place])   . Back pain 01/25/2015  . 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 BOOP (bronchiolitis obliterans with organizing pneumonia) ([place]) 01/20/2014   OLBx 01/20/14 BOOP Started steroids 02/28/14   . Chronic anticoagulation-Xarelto 02/09/2014  . Chronic atrial fibrillation 01/03/2014  . Chronic bronchitis ([place]) 12/21/2013  . Chronic diastolic heart failure ([place]) 02/22/2015  . Chronic pain of both knees 03/18/2017   Overview:  Added automatically from request for surgery [id number]  . Chronic respiratory failure ([place]) 04/10/2015  . COPD (chronic obstructive pulmonary disease) ([place]) 01/25/2015  . Cough    coughing up blood- started last nite, seems to be worse now  . CVA (cerebral vascular accident) ([place]) 02/09/2014  . Dysrhythmia    atrial fib takes cardizem,   . Fibromyalgia   . GERD (gastroesophageal reflux disease)   . H/O hiatal hernia   . Hemoptysis 08/01/2015  .  Herpes zoster 06/26/2015  . History of stroke   . Hyperkalemia-repeat pending 11/29/2014  . Hyperlipemia   . Hyperlipidemia   . Hypertension   . Hypothyroidism   . IDDM (insulin dependent diabetes mellitus) (HCC)    insulin pump   . Long term (current) use of anticoagulants 03/21/2014  . Mild CAD 02/20/2015   Overview:  On recent cardiac cath  Overview:  On recent cardiac cath  . Myocardial infarction [facility])    "one dr says yes; another says no"  . Neuromuscular disorder (HCC)    rls, neuropathy  . Neuropathy    rls, neuropathy   . Normal coronary arteries 2011 11/29/2014  . Obesity (BMI 30-39.9)   . On home oxygen therapy    "2L w/CPAP at bedtime" (01/18/2014)  . OSA on CPAP    cpap & oxygen  . Pericardial effusion   . Peritoneal effusion, chronic 02/22/2015   Overview:  With surgical window  . PNA (pneumonia) 04/10/2015  . Pneumonia 5/15   "several times" (01/18/2014)  . Precordial pain 01/26/2015  . Preoperative cardiovascular examination 02/24/2016  . Primary osteoarthritis of both knees 03/18/2017   Overview:  Added automatically from request for surgery [id number]  . Restless leg syndrome   . Sleep apnea    cpap & oxygen   . Stroke [facility]) 2012   denies residual on 01/18/2014  . Troponin level elevated 11/29/2014  .  Uncontrolled type 2 diabetes mellitus with diabetic polyneuropathy, with long-term current use of insulin ([place]) 12/21/2013    Assessment: 68 y/o M transfer from [facility] for atrial fibrillation   Last dose of Xarelto at [facility] was 1/1 at ~1730  As expected due to Xarelto use, baseline heparin level is elevated at >2.2, will be using aPTT to dose for now  Goal of Therapy:  Heparin level 0.3-0.7 units/ml aPTT 66-102 seconds Monitor platelets by anticoagulation protocol: Yes   Plan:  Start heparin drip at 1200 units/hr today at 1730 (24 hours from previous Xarelto dose) 0200 aPTT/HL Daily CBC/HL/aPTT until heparin level and aPTT correlate   Monitor for bleeding  Narda Bonds 09/02/2018,12:19 AM

## 2018-09-03 ENCOUNTER — Encounter (HOSPITAL_COMMUNITY)
Admission: AD | Disposition: A | Discharge status: Home Health | Payer: Self-pay | Source: Other Acute Inpatient Hospital | Attending: Internal Medicine

## 2018-09-03 ENCOUNTER — Inpatient Hospital Stay (HOSPITAL_COMMUNITY): Payer: Medicare Other

## 2018-09-03 DIAGNOSIS — E440 Moderate protein-calorie malnutrition: Secondary | ICD-10-CM

## 2018-09-03 DIAGNOSIS — I70261 Atherosclerosis of native arteries of extremities with gangrene, right leg: Secondary | ICD-10-CM

## 2018-09-03 LAB — BASIC METABOLIC PANEL
Anion gap: 11 (ref 5–15)
BUN: 34 mg/dL — ABNORMAL HIGH (ref 8–23)
CO2: 23 mmol/L (ref 22–32)
Calcium: 8.6 mg/dL — ABNORMAL LOW (ref 8.9–10.3)
Chloride: 103 mmol/L (ref 98–111)
Creatinine, Ser: 3.75 mg/dL — ABNORMAL HIGH (ref 0.61–1.24)
GFR calc Af Amer: 18 mL/min — ABNORMAL LOW (ref >60)
GFR calc non Af Amer: 15 mL/min — ABNORMAL LOW (ref >60)
Glucose, Bld: 137 mg/dL — ABNORMAL HIGH (ref 70–99)
Potassium: 3.8 mmol/L (ref 3.5–5.1)
Sodium: 137 mmol/L (ref 135–145)

## 2018-09-03 LAB — GLUCOSE, CAPILLARY
GLUCOSE-CAPILLARY: 127 mg/dL — AB (ref 70–99)
Glucose-Capillary: 127 mg/dL — ABNORMAL HIGH (ref 70–99)
Glucose-Capillary: 146 mg/dL — ABNORMAL HIGH (ref 70–99)
Glucose-Capillary: 146 mg/dL — ABNORMAL HIGH (ref 70–99)
Glucose-Capillary: 176 mg/dL — ABNORMAL HIGH (ref 70–99)
Glucose-Capillary: 215 mg/dL — ABNORMAL HIGH (ref 70–99)

## 2018-09-03 LAB — URINALYSIS, ROUTINE W REFLEX MICROSCOPIC
Bilirubin Urine: NEGATIVE
Glucose, UA: NEGATIVE mg/dL
Ketones, ur: NEGATIVE mg/dL
Leukocytes, UA: NEGATIVE
NITRITE: NEGATIVE
Protein, ur: NEGATIVE mg/dL
Specific Gravity, Urine: 1.011 (ref 1.005–1.030)
pH: 5 (ref 5.0–8.0)

## 2018-09-03 LAB — HEPARIN LEVEL (UNFRACTIONATED): Heparin Unfractionated: 1.96 IU/mL — ABNORMAL HIGH (ref 0.30–0.70)

## 2018-09-03 LAB — CBC
HCT: 34.3 % — ABNORMAL LOW (ref 39.0–52.0)
HEMOGLOBIN: 11.8 g/dL — AB (ref 13.0–17.0)
MCH: 29.4 pg (ref 26.0–34.0)
MCHC: 34.4 g/dL (ref 30.0–36.0)
MCV: 85.5 fL (ref 80.0–100.0)
Platelets: 253 10*3/uL (ref 150–400)
RBC: 4.01 MIL/uL — ABNORMAL LOW (ref 4.22–5.81)
RDW: 14.4 % (ref 11.5–15.5)
WBC: 10.2 10*3/uL (ref 4.0–10.5)
nRBC: 0 % (ref 0.0–0.2)

## 2018-09-03 LAB — APTT
aPTT: 74 seconds — ABNORMAL HIGH (ref 24–36)
aPTT: 66 seconds — ABNORMAL HIGH (ref 24–36)

## 2018-09-03 LAB — VANCOMYCIN, RANDOM: Vancomycin Rm: 15

## 2018-09-03 SURGERY — ABDOMINAL AORTOGRAM W/LOWER EXTREMITY
Anesthesia: LOCAL | Laterality: Bilateral

## 2018-09-03 MED ORDER — ADULT MULTIVITAMIN W/MINERALS CH
1.0000 | ORAL_TABLET | Freq: Every day | ORAL | Status: DC
  Administered 2018-09-03 – 2018-09-11 (×9): 1 via ORAL
  Filled 2018-09-03 (×9): qty 1

## 2018-09-03 MED ORDER — POLYETHYLENE GLYCOL 3350 17 G PO PACK
34.0000 g | PACK | Freq: Once | ORAL | Status: AC
  Administered 2018-09-03: 34 g via ORAL
  Filled 2018-09-03: qty 2

## 2018-09-03 MED ORDER — PRO-STAT SUGAR FREE PO LIQD
30.0000 mL | Freq: Two times a day (BID) | ORAL | Status: DC
  Administered 2018-09-03 – 2018-09-11 (×7): 30 mL via ORAL
  Filled 2018-09-03 (×9): qty 30

## 2018-09-03 MED ORDER — SODIUM CHLORIDE 0.9 % IV SOLN
INTRAVENOUS | Status: DC
  Administered 2018-09-03 – 2018-09-04 (×2): via INTRAVENOUS

## 2018-09-03 MED ORDER — OXYCODONE HCL 5 MG PO TABS
5.0000 mg | ORAL_TABLET | Freq: Four times a day (QID) | ORAL | Status: DC | PRN
  Administered 2018-09-03 – 2018-09-11 (×13): 5 mg via ORAL
  Filled 2018-09-03 (×13): qty 1

## 2018-09-03 MED ORDER — ASPIRIN EC 81 MG PO TBEC
81.0000 mg | DELAYED_RELEASE_TABLET | Freq: Every day | ORAL | Status: DC
  Administered 2018-09-03 – 2018-09-09 (×7): 81 mg via ORAL
  Filled 2018-09-03 (×7): qty 1

## 2018-09-03 MED ORDER — VANCOMYCIN VARIABLE DOSE PER UNSTABLE RENAL FUNCTION (PHARMACIST DOSING): Status: DC

## 2018-09-03 NOTE — Progress Notes (Signed)
Initial Nutrition Assessment  DOCUMENTATION CODES:   Non-severe (moderate) malnutrition in context of social or environmental circumstances  INTERVENTION:   - Pt would benefit from bowel regimen given >1 week since last BM - Ordered ProStat BID (each provides 100 kcal and 15 g protein) - Ordered MVI with minerals given poor PO intake - Continue Ensure Enlive BID (each provides 350 kcal and 20 g protein)   NUTRITION DIAGNOSIS:   Moderate Malnutrition related to social / environmental circumstances as evidenced by energy intake < or equal to 75% for > or equal to 1 month, percent weight loss, mild fat depletion, mild muscle depletion.   GOAL:   Patient will meet greater than or equal to 90% of their needs  MONITOR:   PO intake, Supplement acceptance, Weight trends, Labs  REASON FOR ASSESSMENT:   Malnutrition Screening Tool    ASSESSMENT:   68 yo male, admitted with MRSA, R great toe gangrene. PMH significant for T2DM insulin-dependent, atrial fibrillation, HTN, COPD, hypothyroidism, HLD, former smoker. Home meds include Lipitor 40 mg daily, calcium carbonate-vitamin D BID, ferrous sulfate 325 mg daily, insulin via pump, MVI, Protonix, KCl.  Labs: glucose 137, BUN 34, Creatinine 3.75, GFR 15%/18%, Hgb 11.8, Hct 34.3% Meds: Lipitor 40 mg daily, Ensure Enlive BID, novolog q 4 hrs, NS infusion 100 mL/hr  Per chart, pt weighed 124.7 kg in April 2019 --> 18.1 kg (14.5%) wt loss over 8 mo.  Pt in bed at time of visit.  Pt reports fairly poor appetite since his wife passed away this summer. Described moving in with step-daughter, who provides 3 meals daily, but is a stressful living situation. Does not follow any special diet.  Per chart, pt weighed 274# in Apr 2019, 40#/14.5% wt loss in 8 months is clinically significant. Pt endorses some recent weight loss.   Denies nausea or vomiting, diarrhea, or difficulty chewing or swallowing. Endorses constipation, has not had a BM in >1  week.  Encouraged pt to include protein-rich foods with all meals and snacks, and to eat those foods first if not feeling hungry. Pt amenable to continuing Ensure Enlive BID, to trying ProStat BID.   NUTRITION - FOCUSED PHYSICAL EXAM:   Most Recent Value  Orbital Region  No depletion  Upper Arm Region  No depletion  Thoracic and Lumbar Region  No depletion  Buccal Region  Mild depletion  Temple Region  No depletion  Clavicle Bone Region  No depletion  Clavicle and Acromion Bone Region  No depletion  Scapular Bone Region  No depletion  Dorsal Hand  No depletion  Patellar Region  Unable to assess  Anterior Thigh Region  Unable to assess  Posterior Calf Region  Unable to assess  Edema (RD Assessment)  Unable to assess  Hair  Reviewed  Eyes  Reviewed  Mouth  Reviewed  Skin  Reviewed  Nails  Reviewed       Diet Order:  No intake noted, per nsg documentation Diet Order            Diet heart healthy/carb modified Room service appropriate? Yes; Fluid consistency: Thin  Diet effective now              EDUCATION NEEDS:  No education needs have been identified at this time  Skin:  Skin Assessment: Skin Integrity Issues: Skin Integrity Issues:: Other (Comment) Other: R great toe - gangrenous  Last BM:  1/3  Height:  Ht Readings from Last 1 Encounters:  09/01/18 5\' 7"  (1.702  m)    Weight:  Wt Readings from Last 1 Encounters:  09/01/18 106.6 kg    Ideal Body Weight:  67.3 kg  BMI:  Body mass index is 36.81 kg/m.  Estimated Nutritional Needs:   Kcal:  2153 calories daily (MSJ x 1.2)  Protein:  117-139 gm daily (1.1-1.3 g/kg ABW)  Fluid:  >/= 2.1 L daily or per MD discretion  Althea Grimmer, MS, RDN, LDN Pager: [phone] Available Mondays and Fridays, 9am-2pm

## 2018-09-03 NOTE — Consult Note (Signed)
[facility]    NEPHROLOGY CONSULTATION NOTE  PATIENT ID:  Jon [name], DOB:  28-Apr-1951  HPI: The patient is a 68 y.o. year old male with a PMHx of COPD, A-fib, DM and HTN who presents for evaluation of an ischemic right first toe and right leg.  His baseline creatinine appears to be normal at 1.2.  On admission, it was noted that his doppler pulse of the right foot was difficult to obtain.  He reports a several day history of nausea and vomiting while at the OSH and complains of thirst.  He noted his urine to be very dark at that time.  His creatinine has worsened throughout his admission, prompting renal evaluation and delay of angiogram.   Other than right lower leg pain and thirst, he denies any acute complaints.   Past Medical History:  Diagnosis Date  . Abnormal EKG 11/29/2014  . Acute renal insufficiency 11/29/2014  . Angina   . Arthritis    "knees" (01/18/2014)  . Asthma   . Atrial fibrillation ([place])   . Back pain 01/25/2015  . 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 BOOP (bronchiolitis obliterans with organizing pneumonia) ([place]) 01/20/2014   OLBx 01/20/14 BOOP Started steroids 02/28/14   . Chronic anticoagulation-Xarelto 02/09/2014  . Chronic atrial fibrillation 01/03/2014  . Chronic bronchitis ([place]) 12/21/2013  . Chronic diastolic heart failure ([place]) 02/22/2015  . Chronic pain of both knees 03/18/2017   Overview:  Added automatically from request for surgery [id number]  . Chronic respiratory failure ([place]) 04/10/2015  . COPD (chronic obstructive pulmonary disease) ([place]) 01/25/2015  . Cough    coughing up blood- started last nite, seems to be worse now  . CVA (cerebral vascular accident) ([place]) 02/09/2014  . Dysrhythmia    atrial fib takes cardizem,   . Fibromyalgia   . GERD (gastroesophageal reflux disease)   . H/O hiatal hernia   . Hemoptysis 08/01/2015  . Herpes zoster 06/26/2015  . History of stroke   . Hyperkalemia-repeat pending 11/29/2014  . Hyperlipemia   .  Hyperlipidemia   . Hypertension   . Hypothyroidism   . IDDM (insulin dependent diabetes mellitus) (HCC)    insulin pump   . Long term (current) use of anticoagulants 03/21/2014  . Mild CAD 02/20/2015   Overview:  On recent cardiac cath  Overview:  On recent cardiac cath  . Myocardial infarction [facility])    "one dr says yes; another says no"  . Neuromuscular disorder (HCC)    rls, neuropathy  . Neuropathy    rls, neuropathy   . Normal coronary arteries 2011 11/29/2014  . Obesity (BMI 30-39.9)   . On home oxygen therapy    "2L w/CPAP at bedtime" (09/01/2018)  . OSA on CPAP    cpap & oxygen  . Pericardial effusion   . Peritoneal effusion, chronic 02/22/2015   Overview:  With surgical window  . PNA (pneumonia) 04/10/2015  . Pneumonia 5/15   "several times" (01/18/2014)  . Precordial pain 01/26/2015  . Preoperative cardiovascular examination 02/24/2016  . Primary osteoarthritis of both knees 03/18/2017   Overview:  Added automatically from request for surgery [id number]  . Restless leg syndrome   . Sleep apnea    cpap & oxygen   . Stroke [facility]) 2012   denies residual on 01/18/2014  . Troponin level elevated 11/29/2014  . Uncontrolled type 2 diabetes mellitus with diabetic polyneuropathy, with long-term current use of insulin ([place]) 12/21/2013    Past Surgical History:  Procedure Laterality  Date  . APPENDECTOMY    . CARDIAC CATHETERIZATION  X 2 then 03/03/2012    NL LVF, normal coronaries, vessels are small ([facility]: Dr. Beatrix Fetters)  . CATARACT EXTRACTION W/ INTRAOCULAR LENS  IMPLANT, BILATERAL Bilateral   . CHOLECYSTECTOMY    . HERNIA REPAIR     UHR  . LAPAROSCOPIC GASTRIC BANDING  2010  . PERICARDIAL WINDOW Left 01/24/2014   Procedure: PERICARDIAL WINDOW;  Surgeon: Gaye Pollack, MD;  Location: [facility];  Service: Thoracic;  Laterality: Left;  . SINUS EXPLORATION  X 2  . UMBILICAL HERNIA REPAIR    . VIDEO ASSISTED THORACOSCOPY Left 01/24/2014   Procedure: VIDEO ASSISTED THORACOSCOPY;  Surgeon: Gaye Pollack, MD;  Location: [facility] OR;  Service: Thoracic;  Laterality: Left;  VATS/open lung biopsy  . VIDEO BRONCHOSCOPY Bilateral 12/29/2013   Procedure: VIDEO BRONCHOSCOPY WITHOUT FLUORO;  Surgeon: Elsie Stain, MD;  Location: [facility];  Service: Endoscopy;  Laterality: Bilateral;  . VIDEO BRONCHOSCOPY N/A 01/24/2014   Procedure: VIDEO BRONCHOSCOPY;  Surgeon: Gaye Pollack, MD;  Location: [facility] OR;  Service: Thoracic;  Laterality: N/A;    Family History  Problem Relation Age of Onset  . Cancer Mother   . Stroke Father   . Heart disease Father   . Seizures Sister   . Diabetes Sister   . Anesthesia problems Neg Hx   . Hypotension Neg Hx   . Malignant hyperthermia Neg Hx   . Pseudochol deficiency Neg Hx     Social History   Tobacco Use  . Smoking status: Former Smoker    Types: Cigars    Last attempt to quit: 09/01/2006    Years since quitting: 12.0  . Smokeless tobacco: Never Used  Substance Use Topics  . 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 Drug use: No    REVIEW OF SYSTEMS:  He denies any headaches, fevers, nausea, weakness, blurred vision, shortness of breath, chest pain, dysuria, skin rash, arthralgias and myalgias.  He denies any focal weakness or fatigue.  All other ROS are negative.    PHYSICAL EXAM:  Vitals:   09/02/18 2138 09/03/18 0410  BP: 107/88 133/71  Pulse: 67 72  Resp: 18 17  Temp: 98.6 F (37 C)   SpO2: 98% 96%   I/O last 3 completed shifts: In: 605.6 [I.V.:5.6; IV Piggyback:600] Out: 400 [Urine:400]   General:  AAOx3 NAD HEENT: MMM [place] AT anicteric sclera Neck:  No JVD, no adenopathy CV:  Heart RRR  Lungs:  L/S CTA bilaterally Abd:  abd SNT/ND with normal BS GU:  Bladder non-palpable Extremities:  No LE edema. Skin:  No skin rash Psych:  normal mood and affect Neuro:  no focal deficits  MEDICATIONS:   Current Facility-Administered Medications:  .  0.9 %  sodium chloride infusion, , Intravenous,  Continuous, Angelia Mould, MD .  acetaminophen (TYLENOL) tablet 650 mg, 650 mg, Oral, Q6H PRN **OR** acetaminophen (TYLENOL) suppository 650 mg, 650 mg, Rectal, Q6H PRN, Rise Patience, MD .  albuterol (PROVENTIL) (2.5 MG/3ML) 0.083% nebulizer solution 2.5 mg, 2.5 mg, Nebulization, Q6H PRN, Rise Patience, MD .  aspirin EC tablet 81 mg, 81 mg, Oral, Daily, Waynetta Sandy, MD, 81 mg at 09/03/18 1026 .  atorvastatin (LIPITOR) tablet 40 mg, 40 mg, Oral, q1800, Rise Patience, MD, 40 mg at 09/02/18 1726 .  feeding supplement (ENSURE ENLIVE) (ENSURE ENLIVE) liquid 237 mL, 237 mL, Oral,  BID BM, Elgergawy, Silver Huguenin, MD, 237 mL at 09/03/18 1026 .  feeding supplement (PRO-STAT SUGAR FREE 64) liquid 30 mL, 30 mL, Oral, BID, Elgergawy, Silver Huguenin, MD .  fentaNYL (SUBLIMAZE) injection 25 mcg, 25 mcg, Intravenous, Q2H PRN, Rise Patience, MD .  heparin ADULT infusion 100 units/mL (25000 units/278mL sodium chloride 0.45%), 1,300 Units/hr, Intravenous, Continuous, Dang, Thuy D, RPH, Last Rate: 12 mL/hr at 09/03/18 0900, 1,200 Units/hr at 09/03/18 0900 .  hydrALAZINE (APRESOLINE) injection 5 mg, 5 mg, Intravenous, Q4H PRN, Rise Patience, MD .  insulin aspart (novoLOG) injection 0-9 Units, 0-9 Units, Subcutaneous, Q4H, Rise Patience, MD, 3 Units at 09/03/18 1231 .  ipratropium (ATROVENT) nebulizer solution 0.5 mg, 0.5 mg, Nebulization, Q6H PRN, Rise Patience, MD .  meropenem (MERREM) 1 g in sodium chloride 0.9 % 100 mL IVPB, 1 g, Intravenous, Q12H, Erenest Blank, RPH, Last Rate: 200 mL/hr at 09/03/18 1033, 1 g at 09/03/18 1033 .  metoprolol succinate (TOPROL-XL) 24 hr tablet 25 mg, 25 mg, Oral, Daily, Rise Patience, MD, 25 mg at 09/03/18 1026 .  mometasone-formoterol (DULERA) 200-5 MCG/ACT inhaler 2 puff, 2 puff, Inhalation, BID, Rise Patience, MD, 2 puff at 09/02/18 1956 .  multivitamin with minerals tablet 1 tablet, 1 tablet, Oral,  Daily, Elgergawy, Silver Huguenin, MD .  nitroGLYCERIN (NITROSTAT) SL tablet 0.4 mg, 0.4 mg, Sublingual, Q5 min PRN, Rise Patience, MD .  ondansetron [facility]) tablet 4 mg, 4 mg, Oral, Q6H PRN **OR** ondansetron (ZOFRAN) injection 4 mg, 4 mg, Intravenous, Q6H PRN, Rise Patience, MD .  oxyCODONE (Oxy IR/ROXICODONE) immediate release tablet 5 mg, 5 mg, Oral, Q6H PRN, Elgergawy, Silver Huguenin, MD, 5 mg at 09/03/18 1229 .  sodium chloride flush (NS) 0.9 % injection 10-40 mL, 10-40 mL, Intracatheter, PRN, Elgergawy, Dawood S, MD .  vancomycin variable dose per unstable renal function (pharmacist dosing), , Does not apply, See admin instructions, Tyrone Apple, RPH     LABS:  CBC Latest Ref Rng & Units 09/03/2018 09/02/2018 09/01/2018  WBC 4.0 - 10.5 K/uL 10.2 11.0(H) 12.4(H)  Hemoglobin 13.0 - 17.0 g/dL 11.8(L) 11.4(L) 12.7(L)  Hematocrit 39.0 - 52.0 % 34.3(L) 33.7(L) 38.5(L)  Platelets 150 - 400 K/uL 253 253 314    CMP Latest Ref Rng & Units 09/03/2018 09/02/2018 09/01/2018  Glucose 70 - 99 mg/dL 137(H) 161(H) 147(H)  BUN 8 - 23 mg/dL 34(H) 27(H) 34(H)  Creatinine 0.61 - 1.24 mg/dL 3.75(H) 3.33(H) 2.95(H)  Sodium 135 - 145 mmol/L 137 138 139  Potassium 3.5 - 5.1 mmol/L 3.8 4.4 4.4  Chloride 98 - 111 mmol/L 103 104 102  CO2 22 - 32 mmol/L 23 25 25   Calcium 8.9 - 10.3 mg/dL 8.6(L) 8.4(L) 8.8(L)  Total Protein 6.5 - 8.1 g/dL - - 6.6  Total Bilirubin 0.3 - 1.2 mg/dL - - 1.5(H)  Alkaline Phos 38 - 126 U/L - - 137(H)  AST 15 - 41 U/L - - 58(H)  ALT 0 - 44 U/L - - 32    Lab Results  Component Value Date   CALCIUM 8.6 (L) 09/03/2018   CAION 1.28 01/30/2014       Component Value Date/Time   COLORURINE AMBER (A) 01/30/2014 2134   APPEARANCEUR CLOUDY (A) 01/30/2014 2134   LABSPEC 1.019 01/30/2014 2134   PHURINE 8.0 01/30/2014 2134   GLUCOSEU 250 (A) 01/30/2014 2134   HGBUR NEGATIVE 01/30/2014 2134   BILIRUBINUR NEGATIVE 01/30/2014 2134   KETONESUR NEGATIVE 01/30/2014  2134   PROTEINUR NEGATIVE  01/30/2014 2134   UROBILINOGEN 0.2 01/30/2014 2134   NITRITE NEGATIVE 01/30/2014 2134   LEUKOCYTESUR NEGATIVE 01/30/2014 2134      Component Value Date/Time   PHART 7.464 (H) 01/18/2014 1340   PCO2ART 35.5 01/18/2014 1340   PO2ART 83.9 01/18/2014 1340   HCO3 25.1 (H) 01/18/2014 1340   TCO2 24 01/30/2014 1955   O2SAT 96.7 01/18/2014 1340    No results found for: IRON, TIBC, FERRITIN, IRONPCTSAT     ASSESSMENT/PLAN:     Problem List Items Addressed This Visit    None    Visit Diagnoses    Acute renal failure ([place])       Relevant Orders   US RENAL (Completed)      1.  Baseline creatinine 1.2 2.  AKI.  Likely due to volume depletion.  Renal US from 1/3 with no obstruction.  Will check UA.  Continue IVF NSS at 167mL per hour 3.  RLE ischemia.  For angiogram next week.  Hopeful to see creatinine improve    [employer], DO, FACP

## 2018-09-03 NOTE — Progress Notes (Signed)
PROGRESS NOTE                                                                                                                                                                                                             Patient Demographics:    Jon [name], is a 68 y.o. male, DOB - 1951-03-20, IRS:[id number]  Admit date - 09/01/2018   Admitting Physician Antonieta Pert, MD  Outpatient Primary MD for the patient is Cox, Elnita Maxwell, MD  LOS - 2   No chief complaint on file.      Brief Narrative   68 y.o. male with history of diabetes mellitus type 2, atrial fibrillation, hypertension, COPD, sleep apnea , admitted at [facility] for gangrene of his right great toe.  His blood culture grew MRSA, as well he was noted to have AKI, creatinine was 2.2 on admission, from baseline 1.2, had poor Doppler signals of the lower extremities, so he was transferred to [facility] for evaluation by vascular surgery.    Subjective:    Jon [name] today has, No headache, No chest pain, No abdominal pain - No Nausea, does report some constipation   Assessment  & Plan :    Principal Problem:   MRSA bacteremia Active Problems:   Hyperlipidemia   OSA on CPAP   Atrial fibrillation (HCC)   Chronic diastolic heart failure (HCC)   ARF (acute renal failure) (HCC)   Gangrene of toe of right foot (HCC)   Cellulitis of right foot   DM (diabetes mellitus), type 2 with renal complications (HCC)   Right foot cellulitis with gangrenous great toe  -Empirically on IV vancomycin and Primaxin, vancomycin changed to daptomycin in the setting of worsening renal function, ID input greatly appreciated, continue with Primaxin as likely mixed aerobic anaerobic process. -Continue  to hold Xarelto in anticipation for surgery, -Vascular surgery input appreciated, plan for angiogram renal function stabilizes  MRSA bacteremia  -We will need TEE, will discuss with cardiology    -ID input appreciated, continue with daptomycin and Primaxin for now . - As per the discharge summary from [facility] patient 2D echo did not show any vegetations.  Acute renal failure -Plan creatinine, trending up, 2.2 on admission, it is 3.7 today, input appreciated, felt secondary to volume depletion, continue with IV fluids. -  with creatinine worsening from  1.2-2.2.  Repeat metabolic panel has been ordered.  Patient's diuretics including metolazone and torsemide and also would hold ARB.  Avoid nephrotoxic drugs.  Mycin trough level was within normal limits at the [facility] yesterday, his CT lower extremity was without contrast.  -Urinalysis, check urine sodium, inserted Foley catheter as he did have some retention.  Diabetes mellitus type 2  - we will keep patient on sliding scale coverage  Paroxysmal atrial fibrillation with slow ventricular response -Heart rate was in the low 40s and high 30s overnight, I have stopped Cardizem, will hold metoprolol this morning and reassess -Continue with heparin GTT, Xarelto on hold  Hypertension  - on metoprolol,  hydralazine and Cardizem.  Holding Cozaar due to acute renal failure.  -Holding Cardizem for bradycardia  History of COPD  - not actively wheezing.  Continue inhalers.  Sleep apnea on CPAP.  History of nonhemorrhagic CVA on anticoagulation and statins.  Peripheral vascular disease with having discolored foot on the left also. -Vascular surgery following    Code Status : Full  Family Communication  : Wife at bedside  Disposition Plan  : Pending further work-up  Consults  :  Vascular surgery  Procedures  : none  DVT Prophylaxis  :  Heparin GTT  Lab Results  Component Value Date   PLT 253 09/03/2018    Antibiotics  :    Anti-infectives (From admission, onward)   Start     Dose/Rate Route Frequency Ordered Stop   09/03/18 0914  vancomycin variable dose per unstable renal function (pharmacist dosing)       Does not apply See admin instructions 09/03/18 0915     09/02/18 1000  meropenem (MERREM) 1 g in sodium chloride 0.9 % 100 mL IVPB     1 g 200 mL/hr over 30 Minutes Intravenous Every 12 hours 09/02/18 0017     09/02/18 0030  vancomycin (VANCOCIN) 1,750 mg in sodium chloride 0.9 % 500 mL IVPB  Status:  Discontinued     1,750 mg 250 mL/hr over 120 Minutes Intravenous Every 48 hours 09/02/18 0017 09/03/18 0915   09/01/18 2300  vancomycin (VANCOCIN) 2,000 mg in sodium chloride 0.9 % 500 mL IVPB  Status:  Discontinued     2,000 mg 250 mL/hr over 120 Minutes Intravenous  Once 09/01/18 2215 09/01/18 2353   09/01/18 2230  meropenem (MERREM) 2 g in sodium chloride 0.9 % 100 mL IVPB     2 g 200 mL/hr over 30 Minutes Intravenous  Once 09/01/18 2215 09/01/18 2346        Objective:   Vitals:   09/02/18 1632 09/02/18 1957 09/02/18 2138 09/03/18 0410  BP: (!) 96/57  107/88 133/71  Pulse: (!) 44  67 72  Resp: 16  18 17   Temp:   98.6 F (37 C)   TempSrc:      SpO2: 97% 95% 98% 96%  Weight:      Height:        Wt Readings from Last 3 Encounters:  09/01/18 106.6 kg  12/07/17 124.7 kg  05/25/17 128.5 kg     Intake/Output Summary (Last 24 hours) at 09/03/2018 1510 Last data filed at 09/03/2018 1159 Gross per 24 hour  Intake 185.46 ml  Output 2100 ml  Net -1914.54 ml     Physical Exam  Awake Alert, Oriented X 3, No new F.N deficits, Normal affect Symmetrical Chest wall movement, Good air movement bilaterally, CTAB RRR,No Gallops,Rubs or new Murmurs, No Parasternal Heave  soft,  nontender, nondistended, bowel sounds present No Cyanosis, Clubbing or edema, erythema of the right foot, extending from the mid calf up to the right foot, with gangrenous right great toe    Data Review:    CBC Recent Labs  Lab 09/01/18 2157 09/02/18 0351 09/03/18 0240  WBC 12.4* 11.0* 10.2  HGB 12.7* 11.4* 11.8*  HCT 38.5* 33.7* 34.3*  PLT 314 253 253  MCV 87.9 88.7 85.5  MCH 29.0 30.0 29.4  MCHC  33.0 33.8 34.4  RDW 14.1 14.3 14.4  LYMPHSABS 1.1  --   --   MONOABS 0.8  --   --   EOSABS 0.0  --   --   BASOSABS 0.0  --   --     Chemistries  Recent Labs  Lab 09/01/18 2157 09/02/18 0351 09/03/18 0240  NA 139 138 137  K 4.4 4.4 3.8  CL 102 104 103  CO2 25 25 23   GLUCOSE 147* 161* 137*  BUN 34* 27* 34*  CREATININE 2.95* 3.33* 3.75*  CALCIUM 8.8* 8.4* 8.6*  AST 58*  --   --   ALT 32  --   --   ALKPHOS 137*  --   --   BILITOT 1.5*  --   --    ------------------------------------------------------------------------------------------------------------------ No results for input(s): CHOL, HDL, LDLCALC, TRIG, CHOLHDL, LDLDIRECT in the last 72 hours.  Lab Results  Component Value Date   HGBA1C 8.1 (H) 01/31/2014   ------------------------------------------------------------------------------------------------------------------ No results for input(s): TSH, T4TOTAL, T3FREE, THYROIDAB in the last 72 hours.  Invalid input(s): FREET3 ------------------------------------------------------------------------------------------------------------------ No results for input(s): VITAMINB12, FOLATE, FERRITIN, TIBC, IRON, RETICCTPCT in the last 72 hours.  Coagulation profile Recent Labs  Lab 09/01/18 2157  INR 1.68    No results for input(s): DDIMER in the last 72 hours.  Cardiac Enzymes No results for input(s): CKMB, TROPONINI, MYOGLOBIN in the last 168 hours.  Invalid input(s): CK ------------------------------------------------------------------------------------------------------------------    Component Value Date/Time   BNP 66.7 11/29/2014 1410    Inpatient Medications  Scheduled Meds: . aspirin EC  81 mg Oral Daily  . atorvastatin  40 mg Oral q1800  . feeding supplement (ENSURE ENLIVE)  237 mL Oral BID BM  . feeding supplement (PRO-STAT SUGAR FREE 64)  30 mL Oral BID  . insulin aspart  0-9 Units Subcutaneous Q4H  . metoprolol succinate  25 mg Oral Daily  .  mometasone-formoterol  2 puff Inhalation BID  . multivitamin with minerals  1 tablet Oral Daily  . vancomycin variable dose per unstable renal function (pharmacist dosing)   Does not apply See admin instructions   Continuous Infusions: . sodium chloride    . heparin 1,200 Units/hr (09/03/18 0900)  . meropenem (MERREM) IV 1 g (09/03/18 1033)   PRN Meds:.acetaminophen **OR** acetaminophen, albuterol, fentaNYL (SUBLIMAZE) injection, hydrALAZINE, ipratropium, nitroGLYCERIN, ondansetron **OR** ondansetron (ZOFRAN) IV, oxyCODONE, sodium chloride flush  Micro Results Recent Results (from the past 240 hour(s))  Culture, blood (Routine X 2) w Reflex to ID Panel     Status: None (Preliminary result)   Collection Time: 09/02/18  2:24 PM  Result Value Ref Range Status   Specimen Description BLOOD BLOOD LEFT HAND  Final   Special Requests AEROBIC BOTTLE ONLY Blood Culture adequate volume  Final   Culture   Final    NO GROWTH < 24 HOURS Performed at [facility], [place] [address], [place], [place] [address]    Report Status PENDING  Incomplete  Culture, blood (Routine X 2)  w Reflex to ID Panel     Status: None (Preliminary result)   Collection Time: 09/02/18  2:24 PM  Result Value Ref Range Status   Specimen Description BLOOD BLOOD LEFT HAND  Final   Special Requests AEROBIC BOTTLE ONLY Blood Culture adequate volume  Final   Culture   Final    NO GROWTH < 24 HOURS Performed at [facility], [address] [place], [place] [address]    Report Status PENDING  Incomplete    Radiology Reports US Renal  Result Date: 09/03/2018 CLINICAL DATA:  Acute renal failure. EXAM: RENAL / URINARY TRACT ULTRASOUND COMPLETE COMPARISON:  Ultrasound of June 14, 2009. FINDINGS: Right Kidney: Renal measurements: 13.8 x 5.4 x 4.9 cm = volume: 192 mL . Echogenicity within normal limits. No mass or hydronephrosis visualized. Left Kidney: Renal measurements: 12.3 x 5.8 x 5.0 cm = volume: 202 mL.  Echogenicity within normal limits. No mass or hydronephrosis visualized. Bladder: Decompressed secondary to Foley catheter. IMPRESSION: No renal abnormality seen. Electronically Signed   By: Marijo Conception, M.D.   On: 09/03/2018 10:12   Vas [place] Burnard Bunting With/wo Tbi  Result Date: 09/02/2018 LOWER EXTREMITY DOPPLER STUDY Indications: Peripheral artery disease.  Performing Technologist: Carlos Levering RVT  Examination Guidelines: A complete evaluation includes at minimum, Doppler waveform signals and systolic blood pressure reading at the level of bilateral brachial, anterior tibial, and posterior tibial arteries, when vessel segments are accessible. Bilateral testing is considered an integral part of a complete examination. Photoelectric Plethysmograph (PPG) waveforms and toe systolic pressure readings are included as required and additional duplex testing as needed. Limited examinations for reoccurring indications may be performed as noted.  ABI Findings: +---------+------------------+-----+---------+--------+ Right    Rt Pressure (mmHg)IndexWaveform Comment  +---------+------------------+-----+---------+--------+ Brachial 101                    triphasic         +---------+------------------+-----+---------+--------+ PTA      152               1.50 biphasic          +---------+------------------+-----+---------+--------+ DP       104               1.03 biphasic          +---------+------------------+-----+---------+--------+ Great Toe                                Gangrene +---------+------------------+-----+---------+--------+ +---------+------------------+-----+---------+-------+ Left     Lt Pressure (mmHg)IndexWaveform Comment +---------+------------------+-----+---------+-------+ Brachial                                 IV      +---------+------------------+-----+---------+-------+ PTA      254               2.51 triphasic         +---------+------------------+-----+---------+-------+ DP       191               1.89 triphasic        +---------+------------------+-----+---------+-------+ Great Toe76                0.75                  +---------+------------------+-----+---------+-------+ +-------+-----------+-----------+------------+------------+ ABI/TBIToday's ABIToday's TBIPrevious ABIPrevious TBI +-------+-----------+-----------+------------+------------+ Right  1.5                                            +-------+-----------+-----------+------------+------------+  Left   2.51       0.75                                +-------+-----------+-----------+------------+------------+ Right ankle cuff was placed on proximal calf for patient comfort.  Summary: Right: Resting right ankle-brachial index indicates noncompressible right lower extremity arteries. Right ankle cuff was placed on proximal calf for patient comfort. Unable to obtain toe pressure due to gangrenous toe. Left: Resting left ankle-brachial index indicates noncompressible left lower extremity arteries.The left toe-brachial index is normal.  *See table(s) above for measurements and observations.  Electronically signed by Curt Jews MD on 09/02/2018 at 5:34:17 PM.   Final      Phillips Climes M.D on 09/03/2018 at 3:10 PM  Between 7am to 7pm - Pager - [phone]  After 7pm go to [email or web address] - password [facility]  [facility] -  Office  [phone]

## 2018-09-03 NOTE — Progress Notes (Signed)
ANTICOAGULATION CONSULT NOTE  Pharmacy Consult:  Heparin Indication: atrial fibrillation  Allergies  Allergen Reactions  . Hydrocodone Itching  . Morphine Itching  . Duloxetine Hcl Other (See Comments)  . Codeine Itching  . Penicillins Itching and Rash    DID THE REACTION INVOLVE: Swelling of the face/tongue/throat, SOB, or low BP? No Sudden or severe rash/hives, skin peeling, or the inside of the mouth or nose? No Did it require medical treatment? No When did it last happen?unknown If all above answers are "NO", may proceed with cephalosporin use.    Patient Measurements: Height: 5\' 7"  (170.2 cm) Weight: 235 lb (106.6 kg) IBW/kg (Calculated) : 66.1  Heparin dosing weight = 90 kg  Vital Signs: BP: 133/71 (01/03 0410) Pulse Rate: 72 (01/03 0410)  Labs: Recent Labs    09/01/18 2157 09/01/18 2229 09/02/18 0351 09/03/18 0240 09/03/18 1154  HGB 12.7*  --  11.4* 11.8*  --   HCT 38.5*  --  33.7* 34.3*  --   PLT 314  --  253 253  --   APTT  --  47*  --  74* 66*  LABPROT 19.6*  --   --   --   --   INR 1.68  --   --   --   --   HEPARINUNFRC  --  >2.20*  --  1.96*  --   CREATININE 2.95*  --  3.33* 3.75*  --     Estimated Creatinine Clearance: 21.6 mL/min (A) (by C-G formula based on SCr of 3.75 mg/dL (H)).   Assessment: 74 YOM continues on IV heparin for history of Afib while Xarelto is on hold.  Currently using aPTT to guide heparin dosing because Xarelto is falsely elevating heparin levels.  APTT therapeutic and at the low end of normal.  No bleeding reported.   Goal of Therapy:  Heparin level 0.3-0.7 units/ml aPTT 66-102 seconds Monitor platelets by anticoagulation protocol: Yes    Plan:  Increase heparin gtt to 1300 units/hr F/U AM labs   Gabor Lusk D. Mina Marble, PharmD, BCPS, [place] 09/03/2018, 1:41 PM

## 2018-09-03 NOTE — Progress Notes (Signed)
  Progress Note    09/03/2018 8:40 AM * No surgery date entered *  Subjective: Just waking up this morning with no overnight complaints  Vitals:   09/02/18 2138 09/03/18 0410  BP: 107/88 133/71  Pulse: 67 72  Resp: 18 17  Temp: 98.6 F (37 C)   SpO2: 98% 96%    Physical Exam: Awake alert oriented Nonlabored respirations Abdomen is soft Bilateral femoral pulses are palpable Right great toe stable dry gangrene Toe tip ulceration of the left great toe  CBC    Component Value Date/Time   WBC 10.2 09/03/2018 0240   RBC 4.01 (L) 09/03/2018 0240   HGB 11.8 (L) 09/03/2018 0240   HCT 34.3 (L) 09/03/2018 0240   PLT 253 09/03/2018 0240   MCV 85.5 09/03/2018 0240   MCH 29.4 09/03/2018 0240   MCHC 34.4 09/03/2018 0240   RDW 14.4 09/03/2018 0240   LYMPHSABS 1.1 09/01/2018 2157   MONOABS 0.8 09/01/2018 2157   EOSABS 0.0 09/01/2018 2157   BASOSABS 0.0 09/01/2018 2157    BMET    Component Value Date/Time   NA 137 09/03/2018 0240   K 3.8 09/03/2018 0240   CL 103 09/03/2018 0240   CO2 23 09/03/2018 0240   GLUCOSE 137 (H) 09/03/2018 0240   BUN 34 (H) 09/03/2018 0240   CREATININE 3.75 (H) 09/03/2018 0240   CALCIUM 8.6 (L) 09/03/2018 0240   GFRNONAA 15 (L) 09/03/2018 0240   GFRAA 18 (L) 09/03/2018 0240    INR    Component Value Date/Time   INR 1.68 09/01/2018 2157     Intake/Output Summary (Last 24 hours) at 09/03/2018 0840 Last data filed at 09/03/2018 0800 Gross per 24 hour  Intake 273.46 ml  Output 1900 ml  Net -1626.54 ml     Assessment/plan:  68 y.o. male is here with gangrenous right great toe and a small ulcer on the left great toe with worsening renal function.  Received Xarelto dose 2 days ago for atrial fibrillation now on heparin drip.  I have ordered aspirin he is on a statin as well.  Unfortunately with his worsening renal function we will need to wait for angiography next week.  Hopefully with gentle hydration this can improve we can also use CO2 to  limit our contrast administration.  I will order Betadine painting to his right great toe.  We will re-visit early next week to schedule.  If there are issues before this please page on-call vascular surgery.   Asra Gambrel C. Donzetta Matters, MD [facility] Office: [phone] Pager: [phone]  09/03/2018 8:40 AM

## 2018-09-03 NOTE — Progress Notes (Signed)
[name] for Heparin (Xarelto on hold) Indication: atrial fibrillation  Allergies  Allergen Reactions  . Hydrocodone Itching  . Morphine Itching  . Duloxetine Hcl Other (See Comments)  . Codeine Itching  . Penicillins Itching and Rash    DID THE REACTION INVOLVE: Swelling of the face/tongue/throat, SOB, or low BP? No Sudden or severe rash/hives, skin peeling, or the inside of the mouth or nose? No Did it require medical treatment? No When did it last happen?unknown If all above answers are "NO", may proceed with cephalosporin use.    Patient Measurements: Height: 5\' 7"  (170.2 cm) Weight: 235 lb (106.6 kg) IBW/kg (Calculated) : 66.1  Vital Signs: Temp: 98.6 F (37 C) (01/02 2138) BP: 107/88 (01/02 2138) Pulse Rate: 67 (01/02 2138)  Labs: Recent Labs    09/01/18 2157 09/01/18 2229 09/02/18 0351 09/03/18 0240  HGB 12.7*  --  11.4* 11.8*  HCT 38.5*  --  33.7* 34.3*  PLT 314  --  253 253  APTT  --  47*  --  74*  LABPROT 19.6*  --   --   --   INR 1.68  --   --   --   HEPARINUNFRC  --  >2.20*  --   --   CREATININE 2.95*  --  3.33*  --     Estimated Creatinine Clearance: 24.4 mL/min (A) (by C-G formula based on SCr of 3.33 mg/dL (H)).   Medical History: Past Medical History:  Diagnosis Date  . Abnormal EKG 11/29/2014  . Acute renal insufficiency 11/29/2014  . Angina   . Arthritis    "knees" (01/18/2014)  . Asthma   . Atrial fibrillation ([place])   . Back pain 01/25/2015  . 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 BOOP (bronchiolitis obliterans with organizing pneumonia) ([place]) 01/20/2014   OLBx 01/20/14 BOOP Started steroids 02/28/14   . Chronic anticoagulation-Xarelto 02/09/2014  . Chronic atrial fibrillation 01/03/2014  . Chronic bronchitis ([place]) 12/21/2013  . Chronic diastolic heart failure ([place]) 02/22/2015  . Chronic pain of both knees 03/18/2017   Overview:  Added automatically from request for surgery [id number]  . Chronic  respiratory failure ([place]) 04/10/2015  . COPD (chronic obstructive pulmonary disease) ([place]) 01/25/2015  . Cough    coughing up blood- started last nite, seems to be worse now  . CVA (cerebral vascular accident) ([place]) 02/09/2014  . Dysrhythmia    atrial fib takes cardizem,   . Fibromyalgia   . GERD (gastroesophageal reflux disease)   . H/O hiatal hernia   . Hemoptysis 08/01/2015  . Herpes zoster 06/26/2015  . History of stroke   . Hyperkalemia-repeat pending 11/29/2014  . Hyperlipemia   . Hyperlipidemia   . Hypertension   . Hypothyroidism   . IDDM (insulin dependent diabetes mellitus) (HCC)    insulin pump   . Long term (current) use of anticoagulants 03/21/2014  . Mild CAD 02/20/2015   Overview:  On recent cardiac cath  Overview:  On recent cardiac cath  . Myocardial infarction [facility])    "one dr says yes; another says no"  . Neuromuscular disorder (HCC)    rls, neuropathy  . Neuropathy    rls, neuropathy   . Normal coronary arteries 2011 11/29/2014  . Obesity (BMI 30-39.9)   . On home oxygen therapy    "2L w/CPAP at bedtime" (09/01/2018)  . OSA on CPAP    cpap & oxygen  . Pericardial effusion   . Peritoneal effusion, chronic 02/22/2015  Overview:  With surgical window  . PNA (pneumonia) 04/10/2015  . Pneumonia 5/15   "several times" (01/18/2014)  . Precordial pain 01/26/2015  . Preoperative cardiovascular examination 02/24/2016  . Primary osteoarthritis of both knees 03/18/2017   Overview:  Added automatically from request for surgery [id number]  . Restless leg syndrome   . Sleep apnea    cpap & oxygen   . Stroke [facility]) 2012   denies residual on 01/18/2014  . Troponin level elevated 11/29/2014  . Uncontrolled type 2 diabetes mellitus with diabetic polyneuropathy, with long-term current use of insulin ([name]) 12/21/2013    Assessment: 68 y/o M transfer from [place] [name] for atrial fibrillation   Last dose of Xarelto at [place] was 1/1 at ~1730  As expected due to  Xarelto use, baseline heparin level is elevated at >2.2, will be using aPTT to dose for now  1/3 AM update: aPTT this AM is therapeutic at 74  Goal of Therapy:  Heparin level 0.3-0.7 units/ml aPTT 66-102 seconds Monitor platelets by anticoagulation protocol: Yes   Plan:  Cont heparin at 1200 units/hr Confirmatory aPTT at 1200 Daily CBC/HL/aPTT until heparin level and aPTT correlate  Monitor for bleeding  Narda Bonds 09/03/2018,3:37 AM

## 2018-09-03 NOTE — Progress Notes (Signed)
Patient ID: [facility] [name], male   DOB: [birth year], 68 y.o.   MRN: [id number]          [facility]    Date of Admission:  09/01/2018   Total days of antibiotics 5        [facility] [name] has a gangrenous right great toe infection complicated by MRSA bacteremia and acute kidney injury.  Repeat blood cultures are negative at 24 hours.  TTE was negative at [facility] He needs to be scheduled for a TEE.  Because of his worsening renal insufficiency I will change vancomycin to daptomycin.  I will continue meropenem since his toe infection probably represents a mixed aerobic anaerobic process.  Please call Dr. Lita Mains [phone]) for any infectious disease questions this weekend.  Michel Bickers, MD [facility] [phone] pager   [phone] cell 09/03/2018, 3:32 PM

## 2018-09-04 DIAGNOSIS — N170 Acute kidney failure with tubular necrosis: Secondary | ICD-10-CM

## 2018-09-04 LAB — HIV 1/2 AB DIFFERENTIATION
HIV 1 Ab: NEGATIVE
HIV 2 Ab: NEGATIVE
NOTE (HIV CONF MULTISPOT): NEGATIVE

## 2018-09-04 LAB — BASIC METABOLIC PANEL
Anion gap: 9 (ref 5–15)
BUN: 35 mg/dL — ABNORMAL HIGH (ref 8–23)
CO2: 25 mmol/L (ref 22–32)
Calcium: 8.6 mg/dL — ABNORMAL LOW (ref 8.9–10.3)
Chloride: 102 mmol/L (ref 98–111)
Creatinine, Ser: 2.11 mg/dL — ABNORMAL HIGH (ref 0.61–1.24)
GFR calc Af Amer: 36 mL/min — ABNORMAL LOW (ref >60)
GFR calc non Af Amer: 31 mL/min — ABNORMAL LOW (ref >60)
Glucose, Bld: 173 mg/dL — ABNORMAL HIGH (ref 70–99)
Potassium: 3.4 mmol/L — ABNORMAL LOW (ref 3.5–5.1)
Sodium: 136 mmol/L (ref 135–145)

## 2018-09-04 LAB — HEPARIN LEVEL (UNFRACTIONATED): Heparin Unfractionated: 0.61 IU/mL (ref 0.30–0.70)

## 2018-09-04 LAB — CBC
HCT: 37.7 % — ABNORMAL LOW (ref 39.0–52.0)
Hemoglobin: 12.8 g/dL — ABNORMAL LOW (ref 13.0–17.0)
MCH: 29.4 pg (ref 26.0–34.0)
MCHC: 34 g/dL (ref 30.0–36.0)
MCV: 86.7 fL (ref 80.0–100.0)
Platelets: 289 10*3/uL (ref 150–400)
RBC: 4.35 MIL/uL (ref 4.22–5.81)
RDW: 14.4 % (ref 11.5–15.5)
WBC: 9.5 10*3/uL (ref 4.0–10.5)
nRBC: 0 % (ref 0.0–0.2)

## 2018-09-04 LAB — GLUCOSE, CAPILLARY
GLUCOSE-CAPILLARY: 121 mg/dL — AB (ref 70–99)
GLUCOSE-CAPILLARY: 171 mg/dL — AB (ref 70–99)
Glucose-Capillary: 130 mg/dL — ABNORMAL HIGH (ref 70–99)
Glucose-Capillary: 121 mg/dL — ABNORMAL HIGH (ref 70–99)
Glucose-Capillary: 186 mg/dL — ABNORMAL HIGH (ref 70–99)
Glucose-Capillary: 197 mg/dL — ABNORMAL HIGH (ref 70–99)

## 2018-09-04 LAB — RNA QUALITATIVE: HIV 1 RNA Qualitative: 1

## 2018-09-04 LAB — HIV ANTIBODY (ROUTINE TESTING W REFLEX): HIV Screen 4th Generation wRfx: REACTIVE — AB

## 2018-09-04 LAB — APTT: aPTT: 74 seconds — ABNORMAL HIGH (ref 24–36)

## 2018-09-04 MED ORDER — SODIUM CHLORIDE 0.9 % IV SOLN
650.0000 mg | Freq: Once | INTRAVENOUS | Status: AC
  Administered 2018-09-04: 650 mg via INTRAVENOUS
  Filled 2018-09-04: qty 13

## 2018-09-04 MED ORDER — POTASSIUM CHLORIDE CRYS ER 20 MEQ PO TBCR
40.0000 meq | EXTENDED_RELEASE_TABLET | Freq: Once | ORAL | Status: AC
  Administered 2018-09-04: 40 meq via ORAL
  Filled 2018-09-04: qty 2

## 2018-09-04 MED ORDER — SODIUM CHLORIDE 0.45 % IV SOLN
Freq: Once | INTRAVENOUS | Status: AC
  Administered 2018-09-04: 12:00:00 via INTRAVENOUS

## 2018-09-04 MED ORDER — SODIUM CHLORIDE 0.9 % IV SOLN: 650.0000 mg | INTRAVENOUS | Status: DC

## 2018-09-04 MED ORDER — SODIUM CHLORIDE 0.9 % IV SOLN
650.0000 mg | Freq: Every day | INTRAVENOUS | Status: DC
  Administered 2018-09-05 – 2018-09-06 (×2): 650 mg via INTRAVENOUS
  Filled 2018-09-04 (×3): qty 13

## 2018-09-04 NOTE — Progress Notes (Signed)
Pharmacy Antibiotic Note  Jon [name] is a 68 y.o. male with MRSA bacteremia and now AKI.  Per ID note 1/3, switch from Vancomycin to Daptomycin.  Plan: Daptomycin 650 mg IV q48h  Height: 5\' 7"  (170.2 cm) Weight: 235 lb (106.6 kg) IBW/kg (Calculated) : 66.1  Temp (24hrs), Avg:99.5 F (37.5 C), Min:99.1 F (37.3 C), Max:99.9 F (37.7 C)  Recent Labs  Lab 09/01/18 2157 09/02/18 0351 09/03/18 0240 09/03/18 2243  WBC 12.4* 11.0* 10.2  --   CREATININE 2.95* 3.33* 3.75*  --   VANCORANDOM  --   --   --  15    Estimated Creatinine Clearance: 21.6 mL/min (A) (by C-G formula based on SCr of 3.75 mg/dL (H)).    Allergies  Allergen Reactions  . Hydrocodone Itching  . Morphine Itching  . Duloxetine Hcl Other (See Comments)  . Codeine Itching  . Penicillins Itching and Rash    DID THE REACTION INVOLVE: Swelling of the face/tongue/throat, SOB, or low BP? No Sudden or severe rash/hives, skin peeling, or the inside of the mouth or nose? No Did it require medical treatment? No When did it last happen?unknown If all above answers are "NO", may proceed with cephalosporin use.    Caryl Pina 09/04/2018 3:05 AM

## 2018-09-04 NOTE — Progress Notes (Signed)
Pt on CPAP with min of 5 and max of 15. Pt tolerating well RT to cont to monitor.

## 2018-09-04 NOTE — Progress Notes (Signed)
PROGRESS NOTE                                                                                                                                                                                                             Patient Demographics:    [facility] [name], is a 68 y.o. male, DOB - [birth year], VQQ:[id number]  Admit date - 09/01/2018   Admitting Physician Antonieta Pert, MD  Outpatient Primary MD for the patient is Cox, Elnita Maxwell, MD  LOS - 3   No chief complaint on file.      Brief Narrative   68 y.o. male with history of diabetes mellitus type 2, atrial fibrillation, hypertension, COPD, sleep apnea , admitted at [facility] for gangrene of his right great toe.  His blood culture grew MRSA, as well he was noted to have AKI, creatinine was 2.2 on admission, from baseline 1.2, had poor Doppler signals of the lower extremities, so he was transferred to [facility] for evaluation by vascular surgery.    Subjective:    [name] today has, No headache, No chest pain, No abdominal pain - No Nausea, ports his constipation has resolved   Assessment  & Plan :    Principal Problem:   MRSA bacteremia Active Problems:   Hyperlipidemia   OSA on CPAP   Atrial fibrillation (HCC)   Chronic diastolic heart failure (HCC)   ARF (acute renal failure) (HCC)   Gangrene of toe of right foot (HCC)   Cellulitis of right foot   DM (diabetes mellitus), type 2 with renal complications (HCC)   Malnutrition of moderate degree   Right foot cellulitis with gangrenous great toe  -Empirically on IV vancomycin and Primaxin, the input greatly appreciated, vancomycin changed to daptomycin given worsening renal function, continue with Bumex as is likely having mixed aerobic/anaerobic process. -With peripheral vascular disease, vascular surgery input greatly appreciated, plan for angiogram when renal function improves, hopefully next week.  MRSA bacteremia  -We  will need TEE, will discuss with cardiology  -ID input appreciated, continue with daptomycin and Primaxin for now . - As per the discharge summary from [facility] patient 2D echo did not show any vegetations.  Acute renal failure -Baseline creatinine 1.2, peaked at 3.7, renal input is appreciated, felt to be secondary to ischemic ATN from volume depletion, dehydration, proving with IV  fluids, it is at 2.1 today.  Diabetes mellitus type 2  - we will keep patient on sliding scale coverage  Paroxysmal atrial fibrillation with slow ventricular response -Heart rate was on the lower side on presentation, his Cardizem has been stopped, he was continued on metoprolol since then, with overall good heart rate control  -Xarelto on hold in anticipation for angiogram, meanwhile bridged with heparin GTT .  Hypertension  - on metoprolol,  hydralazine and Cardizem.  Holding Cozaar due to acute renal failure.  -Holding Cardizem for bradycardia -Blood pressure controlled  History of COPD  - not actively wheezing.  Continue inhalers.  Sleep apnea on CPAP.  History of nonhemorrhagic CVA on anticoagulation and statins.  Peripheral vascular disease with having discolored foot on the left also. -Vascular surgery following    Code Status : Full  Family Communication  : None at bedside  Disposition Plan  : Pending further work-up  Consults  :  Vascular surgery, renal  Procedures  : none  DVT Prophylaxis  :  Heparin GTT  Lab Results  Component Value Date   PLT 289 09/04/2018    Antibiotics  :    Anti-infectives (From admission, onward)   Start     Dose/Rate Route Frequency Ordered Stop   09/05/18 2000  DAPTOmycin (CUBICIN) 650 mg in sodium chloride 0.9 % IVPB     650 mg 226 mL/hr over 30 Minutes Intravenous Every 48 hours 09/04/18 0310     09/04/18 0800  DAPTOmycin (CUBICIN) 650 mg in sodium chloride 0.9 % IVPB     650 mg 226 mL/hr over 30 Minutes Intravenous  Once 09/04/18 0310 09/04/18  0904   09/03/18 0914  vancomycin variable dose per unstable renal function (pharmacist dosing)  Status:  Discontinued      Does not apply See admin instructions 09/03/18 0915 09/04/18 0310   09/02/18 1000  meropenem (MERREM) 1 g in sodium chloride 0.9 % 100 mL IVPB     1 g 200 mL/hr over 30 Minutes Intravenous Every 12 hours 09/02/18 0017     09/02/18 0030  vancomycin (VANCOCIN) 1,750 mg in sodium chloride 0.9 % 500 mL IVPB  Status:  Discontinued     1,750 mg 250 mL/hr over 120 Minutes Intravenous Every 48 hours 09/02/18 0017 09/03/18 0915   09/01/18 2300  vancomycin (VANCOCIN) 2,000 mg in sodium chloride 0.9 % 500 mL IVPB  Status:  Discontinued     2,000 mg 250 mL/hr over 120 Minutes Intravenous  Once 09/01/18 2215 09/01/18 2353   09/01/18 2230  meropenem (MERREM) 2 g in sodium chloride 0.9 % 100 mL IVPB     2 g 200 mL/hr over 30 Minutes Intravenous  Once 09/01/18 2215 09/01/18 2346        Objective:   Vitals:   09/03/18 2342 09/04/18 0435 09/04/18 0822 09/04/18 0925  BP:  133/81  138/77  Pulse: 62 (!) 57 91 72  Resp:   18   Temp:  97.9 F (36.6 C)    TempSrc:      SpO2: 99% 97% 97%   Weight:      Height:        Wt Readings from Last 3 Encounters:  09/01/18 106.6 kg  12/07/17 124.7 kg  05/25/17 128.5 kg     Intake/Output Summary (Last 24 hours) at 09/04/2018 1243 Last data filed at 09/04/2018 0830 Gross per 24 hour  Intake 1843.37 ml  Output -  Net 1843.37 ml  Physical Exam  Awake Alert, Oriented X 3, No new F.N deficits, Normal affect Symmetrical Chest wall movement, Good air movement bilaterally, CTAB RRR,No Gallops,Rubs or new Murmurs, No Parasternal Heave +ve B.Sounds, Abd Soft, No tenderness, No rebound - guarding or rigidity. No Cyanosis, Clubbing or edema, erythema of the right foot, extending from the mid calf up to the right foot, with gangrenous right great toe    Data Review:    CBC Recent Labs  Lab 09/01/18 2157 09/02/18 0351 09/03/18 0240  09/04/18 1016  WBC 12.4* 11.0* 10.2 9.5  HGB 12.7* 11.4* 11.8* 12.8*  HCT 38.5* 33.7* 34.3* 37.7*  PLT 314 253 253 289  MCV 87.9 88.7 85.5 86.7  MCH 29.0 30.0 29.4 29.4  MCHC 33.0 33.8 34.4 34.0  RDW 14.1 14.3 14.4 14.4  LYMPHSABS 1.1  --   --   --   MONOABS 0.8  --   --   --   EOSABS 0.0  --   --   --   BASOSABS 0.0  --   --   --     Chemistries  Recent Labs  Lab 09/01/18 2157 09/02/18 0351 09/03/18 0240 09/04/18 1016  NA 139 138 137 136  K 4.4 4.4 3.8 3.4*  CL 102 104 103 102  CO2 25 25 23 25   GLUCOSE 147* 161* 137* 173*  BUN 34* 27* 34* 35*  CREATININE 2.95* 3.33* 3.75* 2.11*  CALCIUM 8.8* 8.4* 8.6* 8.6*  AST 58*  --   --   --   ALT 32  --   --   --   ALKPHOS 137*  --   --   --   BILITOT 1.5*  --   --   --    ------------------------------------------------------------------------------------------------------------------ No results for input(s): CHOL, HDL, LDLCALC, TRIG, CHOLHDL, LDLDIRECT in the last 72 hours.  Lab Results  Component Value Date   HGBA1C 8.1 (H) 01/31/2014   ------------------------------------------------------------------------------------------------------------------ No results for input(s): TSH, T4TOTAL, T3FREE, THYROIDAB in the last 72 hours.  Invalid input(s): FREET3 ------------------------------------------------------------------------------------------------------------------ No results for input(s): VITAMINB12, FOLATE, FERRITIN, TIBC, IRON, RETICCTPCT in the last 72 hours.  Coagulation profile Recent Labs  Lab 09/01/18 2157  INR 1.68    No results for input(s): DDIMER in the last 72 hours.  Cardiac Enzymes No results for input(s): CKMB, TROPONINI, MYOGLOBIN in the last 168 hours.  Invalid input(s): CK ------------------------------------------------------------------------------------------------------------------    Component Value Date/Time   BNP 66.7 11/29/2014 1410    Inpatient Medications  Scheduled Meds: .  aspirin EC  81 mg Oral Daily  . atorvastatin  40 mg Oral q1800  . feeding supplement (ENSURE ENLIVE)  237 mL Oral BID BM  . feeding supplement (PRO-STAT SUGAR FREE 64)  30 mL Oral BID  . insulin aspart  0-9 Units Subcutaneous Q4H  . metoprolol succinate  25 mg Oral Daily  . mometasone-formoterol  2 puff Inhalation BID  . multivitamin with minerals  1 tablet Oral Daily  . potassium chloride  40 mEq Oral Once   Continuous Infusions: . [START ON 09/05/2018] DAPTOmycin (CUBICIN)  IV    . heparin 1,300 Units/hr (09/04/18 1112)  . meropenem (MERREM) IV 1 g (09/04/18 0934)   PRN Meds:.acetaminophen **OR** acetaminophen, albuterol, fentaNYL (SUBLIMAZE) injection, hydrALAZINE, ipratropium, nitroGLYCERIN, ondansetron **OR** ondansetron (ZOFRAN) IV, oxyCODONE, sodium chloride flush  Micro Results Recent Results (from the past 240 hour(s))  Culture, blood (Routine X 2) w Reflex to ID Panel     Status: None (Preliminary result)  Collection Time: 09/02/18  2:24 PM  Result Value Ref Range Status   Specimen Description BLOOD BLOOD LEFT HAND  Final   Special Requests AEROBIC BOTTLE ONLY Blood Culture adequate volume  Final   Culture NO GROWTH 2 DAYS  Final   Report Status PENDING  Incomplete  Culture, blood (Routine X 2) w Reflex to ID Panel     Status: None (Preliminary result)   Collection Time: 09/02/18  2:24 PM  Result Value Ref Range Status   Specimen Description BLOOD BLOOD LEFT HAND  Final   Special Requests AEROBIC BOTTLE ONLY Blood Culture adequate volume  Final   Culture NO GROWTH 2 DAYS  Final   Report Status PENDING  Incomplete    Radiology Reports US Renal  Result Date: 09/03/2018 CLINICAL DATA:  Acute renal failure. EXAM: RENAL / URINARY TRACT ULTRASOUND COMPLETE COMPARISON:  Ultrasound of June 14, 2009. FINDINGS: Right Kidney: Renal measurements: 13.8 x 5.4 x 4.9 cm = volume: 192 mL . Echogenicity within normal limits. No mass or hydronephrosis visualized. Left Kidney: Renal  measurements: 12.3 x 5.8 x 5.0 cm = volume: 202 mL. Echogenicity within normal limits. No mass or hydronephrosis visualized. Bladder: Decompressed secondary to Foley catheter. IMPRESSION: No renal abnormality seen. Electronically Signed   By: Marijo Conception, M.D.   On: 09/03/2018 10:12   Vas [place] Burnard Bunting With/wo Tbi  Result Date: 09/02/2018 LOWER EXTREMITY DOPPLER STUDY Indications: Peripheral artery disease.  Performing Technologist: Carlos Levering RVT  Examination Guidelines: A complete evaluation includes at minimum, Doppler waveform signals and systolic blood pressure reading at the level of bilateral brachial, anterior tibial, and posterior tibial arteries, when vessel segments are accessible. Bilateral testing is considered an integral part of a complete examination. Photoelectric Plethysmograph (PPG) waveforms and toe systolic pressure readings are included as required and additional duplex testing as needed. Limited examinations for reoccurring indications may be performed as noted.  ABI Findings: +---------+------------------+-----+---------+--------+ Right    Rt Pressure (mmHg)IndexWaveform Comment  +---------+------------------+-----+---------+--------+ Brachial 101                    triphasic         +---------+------------------+-----+---------+--------+ PTA      152               1.50 biphasic          +---------+------------------+-----+---------+--------+ DP       104               1.03 biphasic          +---------+------------------+-----+---------+--------+ Great Toe                                Gangrene +---------+------------------+-----+---------+--------+ +---------+------------------+-----+---------+-------+ Left     Lt Pressure (mmHg)IndexWaveform Comment +---------+------------------+-----+---------+-------+ Brachial                                 IV      +---------+------------------+-----+---------+-------+ PTA      254               2.51  triphasic        +---------+------------------+-----+---------+-------+ DP       191               1.89 triphasic        +---------+------------------+-----+---------+-------+ Thomes Dinning  0.75                  +---------+------------------+-----+---------+-------+ +-------+-----------+-----------+------------+------------+ ABI/TBIToday's ABIToday's TBIPrevious ABIPrevious TBI +-------+-----------+-----------+------------+------------+ Right  1.5                                            +-------+-----------+-----------+------------+------------+ Left   2.51       0.75                                +-------+-----------+-----------+------------+------------+ Right ankle cuff was placed on proximal calf for patient comfort.  Summary: Right: Resting right ankle-brachial index indicates noncompressible right lower extremity arteries. Right ankle cuff was placed on proximal calf for patient comfort. Unable to obtain toe pressure due to gangrenous toe. Left: Resting left ankle-brachial index indicates noncompressible left lower extremity arteries.The left toe-brachial index is normal.  *See table(s) above for measurements and observations.  Electronically signed by Curt Jews MD on 09/02/2018 at 5:34:17 PM.   Final      Phillips Climes M.D on 09/04/2018 at 12:43 PM  Between 7am to 7pm - Pager - [phone]  After 7pm go to [email or web address] - password [facility]  [facility] -  Office  [phone]

## 2018-09-04 NOTE — Progress Notes (Signed)
[facility]    NEPHROLOGY PROGRESS NOTE  SUBJECTIVE: Feeling well today.  Is nervous about his foot.  Denies chest pain, shortness of breath, nausea, vomiting, diarrhea or dysuria.  Does not have a great appetite this morning.  Denies fevers or chills.  All other review of systems are negative.     OBJECTIVE:  Vitals:   09/04/18 0822 09/04/18 0925  BP:  138/77  Pulse: 91 72  Resp: 18   Temp:    SpO2: 97%    I/O last 3 completed shifts: In: 1048.3 [I.V.:748.3; IV Piggyback:300] Out: 2100 [Urine:2100]   Genearl:  AAOx3 NAD HEENT: MMM [place] AT anicteric sclera Neck:  No JVD, no adenopathy CV:  Heart RRR  Lungs:  L/S CTA bilaterally Abd:  abd SNT/ND with normal BS GU:  Bladder non-palpable Extremities: Trace bilateral lower extremity edema.  Right foot first toe gangrene-bandaged. Skin:  No skin rash  MEDICATIONS:   Current Facility-Administered Medications:  .  0.9 %  sodium chloride infusion, , Intravenous, Continuous, Angelia Mould, MD, Last Rate: 100 mL/hr at 09/04/18 1040 .  acetaminophen (TYLENOL) tablet 650 mg, 650 mg, Oral, Q6H PRN **OR** acetaminophen (TYLENOL) suppository 650 mg, 650 mg, Rectal, Q6H PRN, Rise Patience, MD .  albuterol (PROVENTIL) (2.5 MG/3ML) 0.083% nebulizer solution 2.5 mg, 2.5 mg, Nebulization, Q6H PRN, Rise Patience, MD .  aspirin EC tablet 81 mg, 81 mg, Oral, Daily, Waynetta Sandy, MD, 81 mg at 09/04/18 0925 .  atorvastatin (LIPITOR) tablet 40 mg, 40 mg, Oral, q1800, Rise Patience, MD, 40 mg at 09/03/18 1758 .  [START ON 09/05/2018] DAPTOmycin (CUBICIN) 650 mg in sodium chloride 0.9 % IVPB, 650 mg, Intravenous, Q48H, Schorr, Rhetta Mura, NP .  feeding supplement (ENSURE ENLIVE) (ENSURE ENLIVE) liquid 237 mL, 237 mL, Oral, BID BM, Elgergawy, Silver Huguenin, MD, 237 mL at 09/04/18 1038 .  feeding supplement (PRO-STAT SUGAR FREE 64) liquid 30 mL, 30 mL, Oral, BID, Elgergawy, Silver Huguenin, MD, 30 mL at  09/04/18 0925 .  fentaNYL (SUBLIMAZE) injection 25 mcg, 25 mcg, Intravenous, Q2H PRN, Rise Patience, MD .  heparin ADULT infusion 100 units/mL (25000 units/244mL sodium chloride 0.45%), 1,300 Units/hr, Intravenous, Continuous, Dang, Thuy D, RPH, Last Rate: 13 mL/hr at 09/04/18 1112, 1,300 Units/hr at 09/04/18 1112 .  hydrALAZINE (APRESOLINE) injection 5 mg, 5 mg, Intravenous, Q4H PRN, Rise Patience, MD .  insulin aspart (novoLOG) injection 0-9 Units, 0-9 Units, Subcutaneous, Q4H, Rise Patience, MD, 1 Units at 09/04/18 [phone] .  ipratropium (ATROVENT) nebulizer solution 0.5 mg, 0.5 mg, Nebulization, Q6H PRN, Rise Patience, MD .  meropenem (MERREM) 1 g in sodium chloride 0.9 % 100 mL IVPB, 1 g, Intravenous, Q12H, Erenest Blank, RPH, Last Rate: 200 mL/hr at 09/04/18 0934, 1 g at 09/04/18 0934 .  metoprolol succinate (TOPROL-XL) 24 hr tablet 25 mg, 25 mg, Oral, Daily, Rise Patience, MD, 25 mg at 09/04/18 0925 .  mometasone-formoterol (DULERA) 200-5 MCG/ACT inhaler 2 puff, 2 puff, Inhalation, BID, Rise Patience, MD, 2 puff at 09/04/18 [phone] .  multivitamin with minerals tablet 1 tablet, 1 tablet, Oral, Daily, Elgergawy, Silver Huguenin, MD, 1 tablet at 09/04/18 0925 .  nitroGLYCERIN (NITROSTAT) SL tablet 0.4 mg, 0.4 mg, Sublingual, Q5 min PRN, Rise Patience, MD .  ondansetron [facility]) tablet 4 mg, 4 mg, Oral, Q6H PRN **OR** ondansetron (ZOFRAN) injection 4 mg, 4 mg, Intravenous, Q6H PRN, Rise Patience, MD .  oxyCODONE (Oxy IR/ROXICODONE)  immediate release tablet 5 mg, 5 mg, Oral, Q6H PRN, Elgergawy, Silver Huguenin, MD, 5 mg at 09/03/18 2254 .  sodium chloride flush (NS) 0.9 % injection 10-40 mL, 10-40 mL, Intracatheter, PRN, Elgergawy, Silver Huguenin, MD     LABS:  CBC Latest Ref Rng & Units 09/04/2018 09/03/2018 09/02/2018  WBC 4.0 - 10.5 K/uL 9.5 10.2 11.0(H)  Hemoglobin 13.0 - 17.0 g/dL 12.8(L) 11.8(L) 11.4(L)  Hematocrit 39.0 - 52.0 % 37.7(L) 34.3(L) 33.7(L)   Platelets 150 - 400 K/uL 289 253 253    CMP Latest Ref Rng & Units 09/04/2018 09/03/2018 09/02/2018  Glucose 70 - 99 mg/dL 173(H) 137(H) 161(H)  BUN 8 - 23 mg/dL 35(H) 34(H) 27(H)  Creatinine 0.61 - 1.24 mg/dL 2.11(H) 3.75(H) 3.33(H)  Sodium 135 - 145 mmol/L 136 137 138  Potassium 3.5 - 5.1 mmol/L 3.4(L) 3.8 4.4  Chloride 98 - 111 mmol/L 102 103 104  CO2 22 - 32 mmol/L 25 23 25   Calcium 8.9 - 10.3 mg/dL 8.6(L) 8.6(L) 8.4(L)  Total Protein 6.5 - 8.1 g/dL - - -  Total Bilirubin 0.3 - 1.2 mg/dL - - -  Alkaline Phos 38 - 126 U/L - - -  AST 15 - 41 U/L - - -  ALT 0 - 44 U/L - - -    Lab Results  Component Value Date   CALCIUM 8.6 (L) 09/04/2018   CAION 1.28 01/30/2014       Component Value Date/Time   COLORURINE YELLOW 09/03/2018 1555   APPEARANCEUR HAZY (A) 09/03/2018 1555   LABSPEC 1.011 09/03/2018 1555   PHURINE 5.0 09/03/2018 1555   GLUCOSEU NEGATIVE 09/03/2018 1555   HGBUR SMALL (A) 09/03/2018 1555   BILIRUBINUR NEGATIVE 09/03/2018 1555   KETONESUR NEGATIVE 09/03/2018 1555   PROTEINUR NEGATIVE 09/03/2018 1555   UROBILINOGEN 0.2 01/30/2014 2134   NITRITE NEGATIVE 09/03/2018 1555   LEUKOCYTESUR NEGATIVE 09/03/2018 1555      Component Value Date/Time   PHART 7.464 (H) 01/18/2014 1340   PCO2ART 35.5 01/18/2014 1340   PO2ART 83.9 01/18/2014 1340   HCO3 25.1 (H) 01/18/2014 1340   TCO2 24 01/30/2014 1955   O2SAT 96.7 01/18/2014 1340    No results found for: IRON, TIBC, FERRITIN, IRONPCTSAT     ASSESSMENT/PLAN:     Problem List Items Addressed This Visit    None    Visit Diagnoses    Acute renal failure ([place])       Relevant Orders   US RENAL (Completed)      The patient is a 68 y.o. year old male with a PMHx of COPD, A-fib, DM and HTN who presents for evaluation of an ischemic right first toe and right leg.  His baseline creatinine appears to be normal at 1.2.  On admission, it was noted that his doppler pulse of the right foot was difficult to obtain.  He reports a  several day history of nausea and vomiting while at the OSH and complains of thirst.  He noted his urine to be very dark at that time.  His creatinine has worsened throughout his admission, prompting renal evaluation and delay of angiogram.      1.  Baseline creatinine 1.2 2.  AKI.  Likely due to volume depletion.  Renal US from 1/3 with no obstruction.    Urinalysis without blood or protein.    Will change IV fluids to half-normal saline solution at 75 mL's per hour for 1 L, then will discontinue.  Creatinine is improving. 3.  RLE ischemia.  For angiogram next week.    [place], DO, [place]

## 2018-09-04 NOTE — Progress Notes (Signed)
PHARMACY NOTE:  ANTIMICROBIAL RENAL DOSAGE ADJUSTMENT  Current antimicrobial regimen includes a mismatch between antimicrobial dosage and estimated renal function.  As per policy approved by the Pharmacy & Therapeutics and Medical Executive Committees, the antimicrobial dosage will be adjusted accordingly.  Current antimicrobial dosage:  Daptomycin 6 mg/kg IV q48h  Indication: MRSA bacteremia  Renal Function:  Estimated Creatinine Clearance: 38.5 mL/min (A) (by C-G formula based on SCr of 2.11 mg/dL (H)). []      On intermittent HD, scheduled: []      On CRRT    Antimicrobial dosage has been changed to:  Daptomycin 6 mg/kg IV q24h  Additional comments:   Thank you for allowing pharmacy to be a part of this patient's care.  Jackson Latino, PharmD PGY1 Pharmacy Resident Phone [phone] 09/04/2018     3:33 PM

## 2018-09-04 NOTE — Progress Notes (Signed)
ANTICOAGULATION CONSULT NOTE  Pharmacy Consult:  Heparin Indication: atrial fibrillation  Allergies  Allergen Reactions  . Hydrocodone Itching  . Morphine Itching  . Duloxetine Hcl Other (See Comments)  . Codeine Itching  . Penicillins Itching and Rash    DID THE REACTION INVOLVE: Swelling of the face/tongue/throat, SOB, or low BP? No Sudden or severe rash/hives, skin peeling, or the inside of the mouth or nose? No Did it require medical treatment? No When did it last happen?unknown If all above answers are "NO", may proceed with cephalosporin use.    Patient Measurements: Height: 5\' 7"  (170.2 cm) Weight: 235 lb (106.6 kg) IBW/kg (Calculated) : 66.1  Heparin dosing weight = 90 kg  Vital Signs: Temp: 97.9 F (36.6 C) (01/04 0435) BP: 138/77 (01/04 0925) Pulse Rate: 72 (01/04 0925)  Labs: Recent Labs    09/01/18 2157  09/01/18 2229 09/02/18 0351 09/03/18 0240 09/03/18 1154 09/04/18 1016  HGB 12.7*  --   --  11.4* 11.8*  --  12.8*  HCT 38.5*  --   --  33.7* 34.3*  --  37.7*  PLT 314  --   --  253 253  --  289  APTT  --    < > 47*  --  74* 66* 74*  LABPROT 19.6*  --   --   --   --   --   --   INR 1.68  --   --   --   --   --   --   HEPARINUNFRC  --   --  >2.20*  --  1.96*  --  0.61  CREATININE 2.95*  --   --  3.33* 3.75*  --   --    < > = values in this interval not displayed.    Estimated Creatinine Clearance: 21.6 mL/min (A) (by C-G formula based on SCr of 3.75 mg/dL (H)).   Assessment: 27 YOM continues on IV heparin for history of Afib while Xarelto is on hold.  Heparin level (0.61) and aPTT now correlated (74). Will now monitor using heparin levels alone. No bleeding or infusion issues noted.   Goal of Therapy:  Heparin level 0.3-0.7 units/ml Monitor platelets by anticoagulation protocol: Yes    Plan:  Continue heparin gtt at 1300 units/hr Daily heparin level and CBC Monitor for s/sx of bleeding   Jackson Latino, PharmD PGY1 Pharmacy  Resident Phone [phone] 09/04/2018     11:10 AM

## 2018-09-05 LAB — BASIC METABOLIC PANEL
Anion gap: 10 (ref 5–15)
BUN: 34 mg/dL — ABNORMAL HIGH (ref 8–23)
CO2: 22 mmol/L (ref 22–32)
Calcium: 8.6 mg/dL — ABNORMAL LOW (ref 8.9–10.3)
Chloride: 104 mmol/L (ref 98–111)
Creatinine, Ser: 1.79 mg/dL — ABNORMAL HIGH (ref 0.61–1.24)
GFR calc Af Amer: 44 mL/min — ABNORMAL LOW (ref >60)
GFR calc non Af Amer: 38 mL/min — ABNORMAL LOW (ref >60)
Glucose, Bld: 151 mg/dL — ABNORMAL HIGH (ref 70–99)
Potassium: 3.9 mmol/L (ref 3.5–5.1)
Sodium: 136 mmol/L (ref 135–145)

## 2018-09-05 LAB — CBC
HEMATOCRIT: 38.7 % — AB (ref 39.0–52.0)
Hemoglobin: 12.8 g/dL — ABNORMAL LOW (ref 13.0–17.0)
MCH: 29.2 pg (ref 26.0–34.0)
MCHC: 33.1 g/dL (ref 30.0–36.0)
MCV: 88.2 fL (ref 80.0–100.0)
Platelets: 309 10*3/uL (ref 150–400)
RBC: 4.39 MIL/uL (ref 4.22–5.81)
RDW: 14.4 % (ref 11.5–15.5)
WBC: 10.2 10*3/uL (ref 4.0–10.5)
nRBC: 0 % (ref 0.0–0.2)

## 2018-09-05 LAB — GLUCOSE, CAPILLARY
GLUCOSE-CAPILLARY: 131 mg/dL — AB (ref 70–99)
Glucose-Capillary: 159 mg/dL — ABNORMAL HIGH (ref 70–99)
Glucose-Capillary: 171 mg/dL — ABNORMAL HIGH (ref 70–99)
Glucose-Capillary: 183 mg/dL — ABNORMAL HIGH (ref 70–99)
Glucose-Capillary: 203 mg/dL — ABNORMAL HIGH (ref 70–99)
Glucose-Capillary: 245 mg/dL — ABNORMAL HIGH (ref 70–99)

## 2018-09-05 LAB — HEPARIN LEVEL (UNFRACTIONATED)
Heparin Unfractionated: 0.22 IU/mL — ABNORMAL LOW (ref 0.30–0.70)
Heparin Unfractionated: 0.32 IU/mL (ref 0.30–0.70)

## 2018-09-05 NOTE — Progress Notes (Signed)
    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  After careful review of history and examination, the risks and benefits of transesophageal echocardiogram have been explained including risks of esophageal damage, perforation (1:10,000 risk), bleeding, pharyngeal hematoma as well as other potential complications associated with conscious sedation including aspiration, arrhythmia, respiratory failure and death. Alternatives to treatment were discussed, questions were answered. Patient is willing to proceed.   Kerin Ransom, [place]  09/05/2018 12:59 PM

## 2018-09-05 NOTE — Progress Notes (Signed)
ANTICOAGULATION CONSULT NOTE  Pharmacy Consult:  Heparin Indication: atrial fibrillation  Allergies  Allergen Reactions  . Hydrocodone Itching  . Morphine Itching  . Duloxetine Hcl Other (See Comments)  . Codeine Itching  . Penicillins Itching and Rash    DID THE REACTION INVOLVE: Swelling of the face/tongue/throat, SOB, or low BP? No Sudden or severe rash/hives, skin peeling, or the inside of the mouth or nose? No Did it require medical treatment? No When did it last happen?unknown If all above answers are "NO", may proceed with cephalosporin use.    Patient Measurements: Height: 5\' 7"  (170.2 cm) Weight: 235 lb (106.6 kg) IBW/kg (Calculated) : 66.1  Heparin dosing weight = 90 kg  Vital Signs: Temp: 98.6 F (37 C) (01/04 2117) Temp Source: Oral (01/04 2117) BP: 158/88 (01/04 2117) Pulse Rate: 72 (01/04 2335)  Labs: Recent Labs    09/03/18 0240 09/03/18 1154 09/04/18 1016 09/05/18 0249  HGB 11.8*  --  12.8* 12.8*  HCT 34.3*  --  37.7* 38.7*  PLT 253  --  289 309  APTT 74* 66* 74*  --   HEPARINUNFRC 1.96*  --  0.61 0.22*  CREATININE 3.75*  --  2.11*  --     Estimated Creatinine Clearance: 38.5 mL/min (A) (by C-G formula based on SCr of 2.11 mg/dL (H)).  Assessment: 70 YOM continues on IV heparin for history of Afib while Xarelto is on hold.  Heparin level (0.61) and aPTT now correlated (74). Will now monitor using heparin levels alone. No bleeding or infusion issues noted.  1/5 AM update: heparin level low at 0.22, no issues per RN.   Goal of Therapy:  Heparin level 0.3-0.7 units/ml Monitor platelets by anticoagulation protocol: Yes    Plan:  Inc heparin to 1450 units/hr 1300 HL  Narda Bonds, PharmD, BCPS Clinical Pharmacist Phone: [phone]

## 2018-09-05 NOTE — Progress Notes (Signed)
ANTICOAGULATION CONSULT NOTE  Pharmacy Consult:  Heparin Indication: atrial fibrillation  Allergies  Allergen Reactions  . Hydrocodone Itching  . Morphine Itching  . Duloxetine Hcl Other (See Comments)  . Codeine Itching  . Penicillins Itching and Rash    DID THE REACTION INVOLVE: Swelling of the face/tongue/throat, SOB, or low BP? No Sudden or severe rash/hives, skin peeling, or the inside of the mouth or nose? No Did it require medical treatment? No When did it last happen?unknown If all above answers are "NO", may proceed with cephalosporin use.    Patient Measurements: Height: 5\' 7"  (170.2 cm) Weight: 235 lb (106.6 kg) IBW/kg (Calculated) : 66.1  Heparin dosing weight = 90 kg  Vital Signs: Temp: 98.3 F (36.8 C) (01/05 0920) Temp Source: Oral (01/05 0920) BP: 146/79 (01/05 0920) Pulse Rate: 63 (01/05 0920)  Labs: Recent Labs    09/03/18 0240 09/03/18 1154 09/04/18 1016 09/05/18 0249 09/05/18 1428  HGB 11.8*  --  12.8* 12.8*  --   HCT 34.3*  --  37.7* 38.7*  --   PLT 253  --  289 309  --   APTT 74* 66* 74*  --   --   HEPARINUNFRC 1.96*  --  0.61 0.22* 0.32  CREATININE 3.75*  --  2.11* 1.79*  --     Estimated Creatinine Clearance: 45.3 mL/min (A) (by C-G formula based on SCr of 1.79 mg/dL (H)).  Assessment: 48 YOM continues on IV heparin for history of Afib while Xarelto is on hold.  Heparin level, although drawn late, is now therapeutic (0.32) after rate increase. CBC stable and no infusion issues or bleeding per RN.  Goal of Therapy:  Heparin level 0.3-0.7 units/ml Monitor platelets by anticoagulation protocol: Yes    Plan:  Continue heparin at 1450 units/hr Daily heparin level and CBC  Jackson Latino, PharmD PGY1 Pharmacy Resident Phone [phone] 09/05/2018     2:57 PM

## 2018-09-05 NOTE — Progress Notes (Signed)
[facility]    NEPHROLOGY PROGRESS NOTE  SUBJECTIVE: Feeling well today.  Is anxious to have his procedure on his leg.  Denies chest pain, shortness of breath, nausea, vomiting, diarrhea or dysuria.  Does not have a great appetite this morning.  Denies fevers or chills.  All other review of systems are negative.   OBJECTIVE:  Vitals:   09/05/18 0754 09/05/18 0920  BP:  (!) 146/79  Pulse: 89 63  Resp: 18 16  Temp:  98.3 F (36.8 C)  SpO2: 99% 97%   I/O last 3 completed shifts: In: 2935.7 [P.O.:480; I.V.:1067.7; Other:975; IV Piggyback:413.1] Out: 2500 [Urine:2500]   Genearl:  AAOx3 NAD HEENT: MMM [place] AT anicteric sclera Neck:  No JVD, no adenopathy CV:  Heart RRR  Lungs:  L/S CTA bilaterally Abd:  abd SNT/ND with normal BS GU:  Bladder non-palpable Extremities: Trace bilateral lower extremity edema.  Right foot first toe gangrene-bandaged. Skin:  No skin rash  MEDICATIONS:   Current Facility-Administered Medications:  .  acetaminophen (TYLENOL) tablet 650 mg, 650 mg, Oral, Q6H PRN **OR** acetaminophen (TYLENOL) suppository 650 mg, 650 mg, Rectal, Q6H PRN, Rise Patience, MD .  albuterol (PROVENTIL) (2.5 MG/3ML) 0.083% nebulizer solution 2.5 mg, 2.5 mg, Nebulization, Q6H PRN, Rise Patience, MD .  aspirin EC tablet 81 mg, 81 mg, Oral, Daily, Waynetta Sandy, MD, 81 mg at 09/05/18 7616 .  atorvastatin (LIPITOR) tablet 40 mg, 40 mg, Oral, q1800, Rise Patience, MD, 40 mg at 09/05/18 1704 .  DAPTOmycin (CUBICIN) 650 mg in sodium chloride 0.9 % IVPB, 650 mg, Intravenous, Q2000, Lore, Melissa A, RPH .  feeding supplement (ENSURE ENLIVE) (ENSURE ENLIVE) liquid 237 mL, 237 mL, Oral, BID BM, Elgergawy, Silver Huguenin, MD, 237 mL at 09/05/18 1406 .  feeding supplement (PRO-STAT SUGAR FREE 64) liquid 30 mL, 30 mL, Oral, BID, Elgergawy, Silver Huguenin, MD, 30 mL at 09/05/18 0924 .  fentaNYL (SUBLIMAZE) injection 25 mcg, 25 mcg, Intravenous, Q2H PRN,  Rise Patience, MD .  heparin ADULT infusion 100 units/mL (25000 units/230mL sodium chloride 0.45%), 1,450 Units/hr, Intravenous, Continuous, Erenest Blank, RPH, Last Rate: 14.5 mL/hr at 09/05/18 1100, 1,450 Units/hr at 09/05/18 1100 .  hydrALAZINE (APRESOLINE) injection 5 mg, 5 mg, Intravenous, Q4H PRN, Rise Patience, MD .  insulin aspart (novoLOG) injection 0-9 Units, 0-9 Units, Subcutaneous, Q4H, Rise Patience, MD, 2 Units at 09/05/18 1705 .  ipratropium (ATROVENT) nebulizer solution 0.5 mg, 0.5 mg, Nebulization, Q6H PRN, Rise Patience, MD .  meropenem (MERREM) 1 g in sodium chloride 0.9 % 100 mL IVPB, 1 g, Intravenous, Q12H, Erenest Blank, RPH, Last Rate: 200 mL/hr at 09/05/18 0925, 1 g at 09/05/18 0925 .  metoprolol succinate (TOPROL-XL) 24 hr tablet 25 mg, 25 mg, Oral, Daily, Rise Patience, MD, 25 mg at 09/05/18 0737 .  mometasone-formoterol (DULERA) 200-5 MCG/ACT inhaler 2 puff, 2 puff, Inhalation, BID, Rise Patience, MD, 2 puff at 09/05/18 0753 .  multivitamin with minerals tablet 1 tablet, 1 tablet, Oral, Daily, Elgergawy, Silver Huguenin, MD, 1 tablet at 09/05/18 0924 .  nitroGLYCERIN (NITROSTAT) SL tablet 0.4 mg, 0.4 mg, Sublingual, Q5 min PRN, Rise Patience, MD .  ondansetron [facility]) tablet 4 mg, 4 mg, Oral, Q6H PRN **OR** ondansetron (ZOFRAN) injection 4 mg, 4 mg, Intravenous, Q6H PRN, Rise Patience, MD .  oxyCODONE (Oxy IR/ROXICODONE) immediate release tablet 5 mg, 5 mg, Oral, Q6H PRN, Elgergawy, Silver Huguenin, MD, 5 mg at  09/03/18 2254 .  sodium chloride flush (NS) 0.9 % injection 10-40 mL, 10-40 mL, Intracatheter, PRN, Elgergawy, Silver Huguenin, MD     LABS:  CBC Latest Ref Rng & Units 09/05/2018 09/04/2018 09/03/2018  WBC 4.0 - 10.5 K/uL 10.2 9.5 10.2  Hemoglobin 13.0 - 17.0 g/dL 12.8(L) 12.8(L) 11.8(L)  Hematocrit 39.0 - 52.0 % 38.7(L) 37.7(L) 34.3(L)  Platelets 150 - 400 K/uL 309 289 253    CMP Latest Ref Rng & Units 09/05/2018 09/04/2018  09/03/2018  Glucose 70 - 99 mg/dL 151(H) 173(H) 137(H)  BUN 8 - 23 mg/dL 34(H) 35(H) 34(H)  Creatinine 0.61 - 1.24 mg/dL 1.79(H) 2.11(H) 3.75(H)  Sodium 135 - 145 mmol/L 136 136 137  Potassium 3.5 - 5.1 mmol/L 3.9 3.4(L) 3.8  Chloride 98 - 111 mmol/L 104 102 103  CO2 22 - 32 mmol/L 22 25 23   Calcium 8.9 - 10.3 mg/dL 8.6(L) 8.6(L) 8.6(L)  Total Protein 6.5 - 8.1 g/dL - - -  Total Bilirubin 0.3 - 1.2 mg/dL - - -  Alkaline Phos 38 - 126 U/L - - -  AST 15 - 41 U/L - - -  ALT 0 - 44 U/L - - -    Lab Results  Component Value Date   CALCIUM 8.6 (L) 09/05/2018   CAION 1.28 01/30/2014       Component Value Date/Time   COLORURINE YELLOW 09/03/2018 1555   APPEARANCEUR HAZY (A) 09/03/2018 1555   LABSPEC 1.011 09/03/2018 1555   PHURINE 5.0 09/03/2018 1555   GLUCOSEU NEGATIVE 09/03/2018 1555   HGBUR SMALL (A) 09/03/2018 1555   BILIRUBINUR NEGATIVE 09/03/2018 1555   KETONESUR NEGATIVE 09/03/2018 1555   PROTEINUR NEGATIVE 09/03/2018 1555   UROBILINOGEN 0.2 01/30/2014 2134   NITRITE NEGATIVE 09/03/2018 1555   LEUKOCYTESUR NEGATIVE 09/03/2018 1555      Component Value Date/Time   PHART 7.464 (H) 01/18/2014 1340   PCO2ART 35.5 01/18/2014 1340   PO2ART 83.9 01/18/2014 1340   HCO3 25.1 (H) 01/18/2014 1340   TCO2 24 01/30/2014 1955   O2SAT 96.7 01/18/2014 1340    No results found for: IRON, TIBC, FERRITIN, IRONPCTSAT     ASSESSMENT/PLAN:     Problem List Items Addressed This Visit    None    Visit Diagnoses    Acute renal failure ([place])       Relevant Orders   US RENAL (Completed)      The patient is a 68 y.o. year old male with a PMHx of COPD, A-fib, DM and HTN who presents for evaluation of an ischemic right first toe and right leg.  His baseline creatinine appears to be normal at 1.2.  On admission, it was noted that his doppler pulse of the right foot was difficult to obtain.  He reports a several day history of nausea and vomiting while at the OSH and complains of thirst.  He  noted his urine to be very dark at that time.  His creatinine has worsened throughout his admission, prompting renal evaluation and delay of angiogram.      1.  Baseline creatinine 1.2 2.  AKI.  Likely due to volume depletion.  Renal US from 1/3 with no obstruction.    Urinalysis without blood or protein.    Creatinine is improving.  Will discontinue IV fluids.   3.  RLE ischemia.  For angiogram next week.    [place], DO, [place]

## 2018-09-05 NOTE — Progress Notes (Signed)
PROGRESS NOTE                                                                                                                                                                                                             Patient Demographics:    Jon [name], is a 68 y.o. male, DOB - 04-03-1951, IDP:[id number]  Admit date - 09/01/2018   Admitting Physician Antonieta Pert, MD  Outpatient Primary MD for the patient is Cox, Elnita Maxwell, MD  LOS - 4   No chief complaint on file.      Brief Narrative   68 y.o. male with history of diabetes mellitus type 2, atrial fibrillation, hypertension, COPD, sleep apnea , admitted at [facility] for gangrene of his right great toe.  His blood culture grew MRSA, as well he was noted to have AKI, creatinine was 2.2 on admission, from baseline 1.2, had poor Doppler signals of the lower extremities, so he was transferred to [facility] for evaluation by vascular surgery, had worsening renal function, so his angiogram which was planned by vascular surgery was postponed, seen by renal, renal function is improving, as well seen by ID, who requested TEE for his MRSA bacteremia.    Subjective:    Jon [name] today has, No headache, No chest pain, No abdominal pain - No Nausea, no further constipation   Assessment  & Plan :    Principal Problem:   MRSA bacteremia Active Problems:   Hyperlipidemia   OSA on CPAP   Atrial fibrillation (HCC)   Chronic diastolic heart failure (HCC)   ARF (acute renal failure) (HCC)   Gangrene of toe of right foot (HCC)   Cellulitis of right foot   DM (diabetes mellitus), type 2 with renal complications (HCC)   Malnutrition of moderate degree   Right foot cellulitis with gangrenous great toe  -Empirically on IV vancomycin and Primaxin, the input greatly appreciated, vancomycin changed to daptomycin given worsening renal function, continue with Bumex as is likely having mixed  aerobic/anaerobic process. -With peripheral vascular disease, vascular surgery input greatly appreciated, plan for angiogram when renal function improves, now his renal function has been improving, hopefully his angiogram earlier next week, I will discuss with vascular surgery about timing.  MRSA bacteremia  -ID input appreciated, continue with daptomycinand Primaxin for now . (IV Vancomycin stopped given  worsening renal function)  - As per the discharge summary from [facility] patient 2D echo did not show any vegetations, will need TEE to rule out antibiotic patient's, cardiology to arrange tomorrow.  Acute renal failure -Baseline creatinine 1.2, peaked at 3.7, renal input is appreciated, felt to be secondary to ischemic ATN from volume depletion, dehydration, proving with IV fluids, it is at 1.7 today Diabetes mellitus type 2  - we will keep patient on sliding scale coverage  Peripheral vascular disease with having discolored foot on the left also. -Vascular surgery input greatly appreciated, patient will need angiogram to address revascularization prior to gangrene management, function has improved, plan for angiogram next week. -Vascular surgery following   paroxysmal atrial fibrillation with slow ventricular response -Heart rate was on the lower side on presentation, his Cardizem has been stopped, he was continued on metoprolol since then, with overall good heart rate control  -Xarelto on hold in anticipation for angiogram, meanwhile bridged with heparin GTT .  Hypertension  - on metoprolol,  hydralazine and Cardizem.  Holding Cozaar due to acute renal failure.  -Holding Cardizem for bradycardia -Blood pressure controlled  History of COPD  - not actively wheezing.  Continue inhalers.  Sleep apnea on CPAP.  History of nonhemorrhagic CVA  - on anticoagulation and statins.      Code Status : Full  Family Communication  : None at bedside  Disposition Plan  : Pending further  work-up  Consults  :  Vascular surgery, renal  Procedures  : none  DVT Prophylaxis  :  Heparin GTT  Lab Results  Component Value Date   PLT 309 09/05/2018    Antibiotics  :    Anti-infectives (From admission, onward)   Start     Dose/Rate Route Frequency Ordered Stop   09/05/18 2000  DAPTOmycin (CUBICIN) 650 mg in sodium chloride 0.9 % IVPB  Status:  Discontinued     650 mg 226 mL/hr over 30 Minutes Intravenous Every 48 hours 09/04/18 0310 09/04/18 1531   09/05/18 2000  DAPTOmycin (CUBICIN) 650 mg in sodium chloride 0.9 % IVPB     650 mg 226 mL/hr over 30 Minutes Intravenous Daily 09/04/18 1531     09/04/18 0800  DAPTOmycin (CUBICIN) 650 mg in sodium chloride 0.9 % IVPB     650 mg 226 mL/hr over 30 Minutes Intravenous  Once 09/04/18 0310 09/04/18 1521   09/03/18 0914  vancomycin variable dose per unstable renal function (pharmacist dosing)  Status:  Discontinued      Does not apply See admin instructions 09/03/18 0915 09/04/18 0310   09/02/18 1000  meropenem (MERREM) 1 g in sodium chloride 0.9 % 100 mL IVPB     1 g 200 mL/hr over 30 Minutes Intravenous Every 12 hours 09/02/18 0017     09/02/18 0030  vancomycin (VANCOCIN) 1,750 mg in sodium chloride 0.9 % 500 mL IVPB  Status:  Discontinued     1,750 mg 250 mL/hr over 120 Minutes Intravenous Every 48 hours 09/02/18 0017 09/03/18 0915   09/01/18 2300  vancomycin (VANCOCIN) 2,000 mg in sodium chloride 0.9 % 500 mL IVPB  Status:  Discontinued     2,000 mg 250 mL/hr over 120 Minutes Intravenous  Once 09/01/18 2215 09/01/18 2353   09/01/18 2230  meropenem (MERREM) 2 g in sodium chloride 0.9 % 100 mL IVPB     2 g 200 mL/hr over 30 Minutes Intravenous  Once 09/01/18 2215 09/01/18 2346  Objective:   Vitals:   09/04/18 2335 09/05/18 0447 09/05/18 0754 09/05/18 0920  BP:  (!) 169/73  (!) 146/79  Pulse: 72 (!) 57 89 63  Resp: 16 20 18 16   Temp:  99.1 F (37.3 C)  98.3 F (36.8 C)  TempSrc:  Oral  Oral  SpO2: 95% 98% 99%  97%  Weight:      Height:        Wt Readings from Last 3 Encounters:  09/01/18 106.6 kg  12/07/17 124.7 kg  05/25/17 128.5 kg     Intake/Output Summary (Last 24 hours) at 09/05/2018 1433 Last data filed at 09/05/2018 1411 Gross per 24 hour  Intake 1188.53 ml  Output 2525 ml  Net -1336.47 ml     Physical Exam  Awake Alert, Oriented X 3, No new F.N deficits, Normal affect Symmetrical Chest wall movement, Good air movement bilaterally, CTAB RRR,No Gallops,Rubs or new Murmurs, No Parasternal Heave +ve B.Sounds, Abd Soft, No tenderness, No rebound - guarding or rigidity   erythema of the right foot, extending from the mid calf up to the right foot, with gangrenous right great toe    Data Review:    CBC Recent Labs  Lab 09/01/18 2157 09/02/18 0351 09/03/18 0240 09/04/18 1016 09/05/18 0249  WBC 12.4* 11.0* 10.2 9.5 10.2  HGB 12.7* 11.4* 11.8* 12.8* 12.8*  HCT 38.5* 33.7* 34.3* 37.7* 38.7*  PLT 314 253 253 289 309  MCV 87.9 88.7 85.5 86.7 88.2  MCH 29.0 30.0 29.4 29.4 29.2  MCHC 33.0 33.8 34.4 34.0 33.1  RDW 14.1 14.3 14.4 14.4 14.4  LYMPHSABS 1.1  --   --   --   --   MONOABS 0.8  --   --   --   --   EOSABS 0.0  --   --   --   --   BASOSABS 0.0  --   --   --   --     Chemistries  Recent Labs  Lab 09/01/18 2157 09/02/18 0351 09/03/18 0240 09/04/18 1016 09/05/18 0249  NA 139 138 137 136 136  K 4.4 4.4 3.8 3.4* 3.9  CL 102 104 103 102 104  CO2 25 25 23 25 22   GLUCOSE 147* 161* 137* 173* 151*  BUN 34* 27* 34* 35* 34*  CREATININE 2.95* 3.33* 3.75* 2.11* 1.79*  CALCIUM 8.8* 8.4* 8.6* 8.6* 8.6*  AST 58*  --   --   --   --   ALT 32  --   --   --   --   ALKPHOS 137*  --   --   --   --   BILITOT 1.5*  --   --   --   --    ------------------------------------------------------------------------------------------------------------------ No results for input(s): CHOL, HDL, LDLCALC, TRIG, CHOLHDL, LDLDIRECT in the last 72 hours.  Lab Results  Component Value Date    HGBA1C 8.1 (H) 01/31/2014   ------------------------------------------------------------------------------------------------------------------ No results for input(s): TSH, T4TOTAL, T3FREE, THYROIDAB in the last 72 hours.  Invalid input(s): FREET3 ------------------------------------------------------------------------------------------------------------------ No results for input(s): VITAMINB12, FOLATE, FERRITIN, TIBC, IRON, RETICCTPCT in the last 72 hours.  Coagulation profile Recent Labs  Lab 09/01/18 2157  INR 1.68    No results for input(s): DDIMER in the last 72 hours.  Cardiac Enzymes No results for input(s): CKMB, TROPONINI, MYOGLOBIN in the last 168 hours.  Invalid input(s): CK ------------------------------------------------------------------------------------------------------------------    Component Value Date/Time   BNP 66.7 11/29/2014 1410    Inpatient Medications  Scheduled Meds: . aspirin EC  81 mg Oral Daily  . atorvastatin  40 mg Oral q1800  . feeding supplement (ENSURE ENLIVE)  237 mL Oral BID BM  . feeding supplement (PRO-STAT SUGAR FREE 64)  30 mL Oral BID  . insulin aspart  0-9 Units Subcutaneous Q4H  . metoprolol succinate  25 mg Oral Daily  . mometasone-formoterol  2 puff Inhalation BID  . multivitamin with minerals  1 tablet Oral Daily   Continuous Infusions: . DAPTOmycin (CUBICIN)  IV    . heparin 1,450 Units/hr (09/05/18 1100)  . meropenem (MERREM) IV 1 g (09/05/18 0925)   PRN Meds:.acetaminophen **OR** acetaminophen, albuterol, fentaNYL (SUBLIMAZE) injection, hydrALAZINE, ipratropium, nitroGLYCERIN, ondansetron **OR** ondansetron (ZOFRAN) IV, oxyCODONE, sodium chloride flush  Micro Results Recent Results (from the past 240 hour(s))  Culture, blood (Routine X 2) w Reflex to ID Panel     Status: None (Preliminary result)   Collection Time: 09/02/18  2:24 PM  Result Value Ref Range Status   Specimen Description BLOOD BLOOD LEFT HAND   Final   Special Requests AEROBIC BOTTLE ONLY Blood Culture adequate volume  Final   Culture NO GROWTH 3 DAYS  Final   Report Status PENDING  Incomplete  Culture, blood (Routine X 2) w Reflex to ID Panel     Status: None (Preliminary result)   Collection Time: 09/02/18  2:24 PM  Result Value Ref Range Status   Specimen Description BLOOD BLOOD LEFT HAND  Final   Special Requests AEROBIC BOTTLE ONLY Blood Culture adequate volume  Final   Culture NO GROWTH 3 DAYS  Final   Report Status PENDING  Incomplete    Radiology Reports US Renal  Result Date: 09/03/2018 CLINICAL DATA:  Acute renal failure. EXAM: RENAL / URINARY TRACT ULTRASOUND COMPLETE COMPARISON:  Ultrasound of June 14, 2009. FINDINGS: Right Kidney: Renal measurements: 13.8 x 5.4 x 4.9 cm = volume: 192 mL . Echogenicity within normal limits. No mass or hydronephrosis visualized. Left Kidney: Renal measurements: 12.3 x 5.8 x 5.0 cm = volume: 202 mL. Echogenicity within normal limits. No mass or hydronephrosis visualized. Bladder: Decompressed secondary to Foley catheter. IMPRESSION: No renal abnormality seen. Electronically Signed   By: Marijo Conception, M.D.   On: 09/03/2018 10:12   Vas [place] Burnard Bunting With/wo Tbi  Result Date: 09/02/2018 LOWER EXTREMITY DOPPLER STUDY Indications: Peripheral artery disease.  Performing Technologist: Carlos Levering RVT  Examination Guidelines: A complete evaluation includes at minimum, Doppler waveform signals and systolic blood pressure reading at the level of bilateral brachial, anterior tibial, and posterior tibial arteries, when vessel segments are accessible. Bilateral testing is considered an integral part of a complete examination. Photoelectric Plethysmograph (PPG) waveforms and toe systolic pressure readings are included as required and additional duplex testing as needed. Limited examinations for reoccurring indications may be performed as noted.  ABI Findings:  +---------+------------------+-----+---------+--------+ Right    Rt Pressure (mmHg)IndexWaveform Comment  +---------+------------------+-----+---------+--------+ Brachial 101                    triphasic         +---------+------------------+-----+---------+--------+ PTA      152               1.50 biphasic          +---------+------------------+-----+---------+--------+ DP       104               1.03 biphasic          +---------+------------------+-----+---------+--------+  Great Toe                                Gangrene +---------+------------------+-----+---------+--------+ +---------+------------------+-----+---------+-------+ Left     Lt Pressure (mmHg)IndexWaveform Comment +---------+------------------+-----+---------+-------+ Brachial                                 IV      +---------+------------------+-----+---------+-------+ PTA      254               2.51 triphasic        +---------+------------------+-----+---------+-------+ DP       191               1.89 triphasic        +---------+------------------+-----+---------+-------+ Great Toe76                0.75                  +---------+------------------+-----+---------+-------+ +-------+-----------+-----------+------------+------------+ ABI/TBIToday's ABIToday's TBIPrevious ABIPrevious TBI +-------+-----------+-----------+------------+------------+ Right  1.5                                            +-------+-----------+-----------+------------+------------+ Left   2.51       0.75                                +-------+-----------+-----------+------------+------------+ Right ankle cuff was placed on proximal calf for patient comfort.  Summary: Right: Resting right ankle-brachial index indicates noncompressible right lower extremity arteries. Right ankle cuff was placed on proximal calf for patient comfort. Unable to obtain toe pressure due to gangrenous toe. Left: Resting  left ankle-brachial index indicates noncompressible left lower extremity arteries.The left toe-brachial index is normal.  *See table(s) above for measurements and observations.  Electronically signed by Curt Jews MD on 09/02/2018 at 5:34:17 PM.   Final      Phillips Climes M.D on 09/05/2018 at 2:33 PM  Between 7am to 7pm - Pager - [phone]  After 7pm go to [email or web address] - password [facility]  [facility] -  Office  [phone]

## 2018-09-06 DIAGNOSIS — N289 Disorder of kidney and ureter, unspecified: Secondary | ICD-10-CM

## 2018-09-06 DIAGNOSIS — E1129 Type 2 diabetes mellitus with other diabetic kidney complication: Secondary | ICD-10-CM

## 2018-09-06 DIAGNOSIS — I998 Other disorder of circulatory system: Secondary | ICD-10-CM

## 2018-09-06 DIAGNOSIS — E1152 Type 2 diabetes mellitus with diabetic peripheral angiopathy with gangrene: Principal | ICD-10-CM

## 2018-09-06 LAB — GLUCOSE, CAPILLARY
Glucose-Capillary: 119 mg/dL — ABNORMAL HIGH (ref 70–99)
Glucose-Capillary: 127 mg/dL — ABNORMAL HIGH (ref 70–99)
Glucose-Capillary: 143 mg/dL — ABNORMAL HIGH (ref 70–99)
Glucose-Capillary: 180 mg/dL — ABNORMAL HIGH (ref 70–99)
Glucose-Capillary: 185 mg/dL — ABNORMAL HIGH (ref 70–99)
Glucose-Capillary: 253 mg/dL — ABNORMAL HIGH (ref 70–99)

## 2018-09-06 LAB — CBC
HCT: 37.5 % — ABNORMAL LOW (ref 39.0–52.0)
Hemoglobin: 12.6 g/dL — ABNORMAL LOW (ref 13.0–17.0)
MCH: 28.9 pg (ref 26.0–34.0)
MCHC: 33.6 g/dL (ref 30.0–36.0)
MCV: 86 fL (ref 80.0–100.0)
Platelets: 317 10*3/uL (ref 150–400)
RBC: 4.36 MIL/uL (ref 4.22–5.81)
RDW: 14.3 % (ref 11.5–15.5)
WBC: 10 10*3/uL (ref 4.0–10.5)
nRBC: 0 % (ref 0.0–0.2)

## 2018-09-06 LAB — BASIC METABOLIC PANEL
Anion gap: 9 (ref 5–15)
BUN: 26 mg/dL — AB (ref 8–23)
CO2: 24 mmol/L (ref 22–32)
Calcium: 8.5 mg/dL — ABNORMAL LOW (ref 8.9–10.3)
Chloride: 106 mmol/L (ref 98–111)
Creatinine, Ser: 1.3 mg/dL — ABNORMAL HIGH (ref 0.61–1.24)
GFR calc Af Amer: >60 mL/min (ref >60)
GFR calc non Af Amer: 56 mL/min — ABNORMAL LOW (ref >60)
Glucose, Bld: 146 mg/dL — ABNORMAL HIGH (ref 70–99)
POTASSIUM: 3.3 mmol/L — AB (ref 3.5–5.1)
Sodium: 139 mmol/L (ref 135–145)

## 2018-09-06 LAB — HEPARIN LEVEL (UNFRACTIONATED)
HEPARIN UNFRACTIONATED: 0.18 [IU]/mL — AB (ref 0.30–0.70)
Heparin Unfractionated: 0.3 IU/mL (ref 0.30–0.70)

## 2018-09-06 MED ORDER — SODIUM CHLORIDE 0.9 % IV SOLN
1.0000 g | Freq: Three times a day (TID) | INTRAVENOUS | Status: DC
  Administered 2018-09-06 – 2018-09-07 (×3): 1 g via INTRAVENOUS
  Filled 2018-09-06 (×4): qty 1

## 2018-09-06 NOTE — Progress Notes (Signed)
Pharmacy Antibiotic Note  Jon [name] is a 68 y.o. male with MRSA bacteremia and now AKI.  Per ID note 1/3, switch from Vancomycin to Daptomycin.  Scr continues to improve. It's down to 1.3 CrCl~62 ml/min. We will adjust the Merrem dose today.   Plan: Daptomycin 650 mg IV q24 Change Merrem to 1g IV q8  Height: 5\' 7"  (170.2 cm) Weight: 235 lb (106.6 kg) IBW/kg (Calculated) : 66.1  Temp (24hrs), Avg:98.3 F (36.8 C), Min:98 F (36.7 C), Max:98.5 F (36.9 C)  Recent Labs  Lab 09/02/18 0351 09/03/18 0240 09/03/18 2243 09/04/18 1016 09/05/18 0249 09/06/18 0337  WBC 11.0* 10.2  --  9.5 10.2 10.0  CREATININE 3.33* 3.75*  --  2.11* 1.79* 1.30*  VANCORANDOM  --   --  15  --   --   --     Estimated Creatinine Clearance: 62.4 mL/min (A) (by C-G formula based on SCr of 1.3 mg/dL (H)).    Allergies  Allergen Reactions  . Hydrocodone Itching  . Morphine Itching  . Duloxetine Hcl Other (See Comments)  . Codeine Itching  . Penicillins Itching and Rash    DID THE REACTION INVOLVE: Swelling of the face/tongue/throat, SOB, or low BP? No Sudden or severe rash/hives, skin peeling, or the inside of the mouth or nose? No Did it require medical treatment? No When did it last happen?unknown If all above answers are "NO", may proceed with cephalosporin use.    Onnie Boer, PharmD, BCIDP, AAHIVP, CPP Infectious Disease Pharmacist 09/06/2018 12:45 PM

## 2018-09-06 NOTE — Progress Notes (Signed)
Patient ID: Jon [name], male   DOB: 1951-05-06, 68 y.o.   MRN: [id number]         [facility] for Infectious Disease  Date of Admission:  09/01/2018   Total days of antibiotics 8         ASSESSMENT: He has MRSA bacteremia complicating ischemia and infection of his right great toe.  He is improving.  His renal function is improving but I will continue daptomycin and meropenem for now.  PLAN: 1. Continue current antibiotics 2. Await results of TEE and arteriogram  Principal Problem:   MRSA bacteremia Active Problems:   Gangrene of toe of right foot (HCC)   Hyperlipidemia   OSA on CPAP   Atrial fibrillation (HCC)   Chronic diastolic heart failure (HCC)   ARF (acute renal failure) (HCC)   Cellulitis of right foot   DM (diabetes mellitus), type 2 with renal complications (HCC)   Malnutrition of moderate degree   Scheduled Meds: . aspirin EC  81 mg Oral Daily  . atorvastatin  40 mg Oral q1800  . feeding supplement (ENSURE ENLIVE)  237 mL Oral BID BM  . feeding supplement (PRO-STAT SUGAR FREE 64)  30 mL Oral BID  . insulin aspart  0-9 Units Subcutaneous Q4H  . metoprolol succinate  25 mg Oral Daily  . mometasone-formoterol  2 puff Inhalation BID  . multivitamin with minerals  1 tablet Oral Daily   Continuous Infusions: . DAPTOmycin (CUBICIN)  IV 650 mg (09/05/18 2013)  . heparin 1,700 Units/hr (09/06/18 1540)  . meropenem (MERREM) IV     PRN Meds:.acetaminophen **OR** acetaminophen, albuterol, fentaNYL (SUBLIMAZE) injection, hydrALAZINE, ipratropium, nitroGLYCERIN, ondansetron **OR** ondansetron (ZOFRAN) IV, oxyCODONE, sodium chloride flush   SUBJECTIVE: He is feeling better.  He is having some pain in his right great toe.  He was able to get up and walk in the hall today.  Review of Systems: Review of Systems  Constitutional: Negative for chills, diaphoresis and fever.  Musculoskeletal: Positive for joint pain.    Allergies  Allergen Reactions  .  Hydrocodone Itching  . Morphine Itching  . Duloxetine Hcl Other (See Comments)  . Codeine Itching  . Penicillins Itching and Rash    DID THE REACTION INVOLVE: Swelling of the face/tongue/throat, SOB, or low BP? No Sudden or severe rash/hives, skin peeling, or the inside of the mouth or nose? No Did it require medical treatment? No When did it last happen?unknown If all above answers are "NO", may proceed with cephalosporin use.     OBJECTIVE: Vitals:   09/05/18 2325 09/06/18 0435 09/06/18 0817 09/06/18 1357  BP:  (!) 147/79  (!) 150/77  Pulse: 66 (!) 46 70 79  Resp:      Temp:  98 F (36.7 C)  98.1 F (36.7 C)  TempSrc:  Oral  Oral  SpO2: 97% 97% 96% 100%  Weight:      Height:       Body mass index is 36.81 kg/m.  Physical Exam Constitutional:      Comments: He is worried about losing his right great toe and is anxious about his upcoming procedures.  Cardiovascular:     Rate and Rhythm: Normal rate and regular rhythm.     Heart sounds: No murmur.  Pulmonary:     Effort: Pulmonary effort is normal.  Musculoskeletal:     Comments: Dry gangrene of his right great toe persist.  He is having some bloody drainage.  There is no odor.  Lab Results Lab Results  Component Value Date   WBC 10.0 09/06/2018   HGB 12.6 (L) 09/06/2018   HCT 37.5 (L) 09/06/2018   MCV 86.0 09/06/2018   PLT 317 09/06/2018    Lab Results  Component Value Date   CREATININE 1.30 (H) 09/06/2018   BUN 26 (H) 09/06/2018   NA 139 09/06/2018   K 3.3 (L) 09/06/2018   CL 106 09/06/2018   CO2 24 09/06/2018    Lab Results  Component Value Date   ALT 32 09/01/2018   AST 58 (H) 09/01/2018   ALKPHOS 137 (H) 09/01/2018   BILITOT 1.5 (H) 09/01/2018     Microbiology: Recent Results (from the past 240 hour(s))  Culture, blood (Routine X 2) w Reflex to ID Panel     Status: None (Preliminary result)   Collection Time: 09/02/18  2:24 PM  Result Value Ref Range Status   Specimen Description  BLOOD BLOOD LEFT HAND  Final   Special Requests AEROBIC BOTTLE ONLY Blood Culture adequate volume  Final   Culture NO GROWTH 4 DAYS  Final   Report Status PENDING  Incomplete  Culture, blood (Routine X 2) w Reflex to ID Panel     Status: None (Preliminary result)   Collection Time: 09/02/18  2:24 PM  Result Value Ref Range Status   Specimen Description BLOOD BLOOD LEFT HAND  Final   Special Requests AEROBIC BOTTLE ONLY Blood Culture adequate volume  Final   Culture NO GROWTH 4 DAYS  Final   Report Status PENDING  Incomplete    Michel Bickers, MD [place] [facility] [place] [phone] pager   [phone] cell 09/06/2018, 3:44 PM

## 2018-09-06 NOTE — Evaluation (Signed)
Physical Therapy Evaluation Patient Details Name: Jon [name] MRN: [id number] DOB: 1951/06/09 Today's Date: 09/06/2018   History of Present Illness  Patient is a 68 y/o male admitted on 09/01/17 from [facility] due to Right foot great toe gangrene, foot discoloration, pain. Consult by vascular surgery. Past medical history significant for diabetes mellitus type 2, atrial fibrillation, hypertension, COPD, sleep apnea.    Clinical Impression  Mr. Russey admitted with the above listed diagnosis. Patient reports Mod I with mobility and ADLs prior to admission - was receiving HHPT prior to admission due to reported history of falling. Patient today requiring overall min guard assist for transfers and mobility with RW - does require increased time and effort for all mobility. Will recommend HHPT at discharge. PT to follow acutely.     Follow Up Recommendations Home health PT    Equipment Recommendations  None recommended by PT    Recommendations for Other Services       Precautions / Restrictions Precautions Precautions: Fall Restrictions Weight Bearing Restrictions: No      Mobility  Bed Mobility Overal bed mobility: Modified Independent             General bed mobility comments: increased time and effort - use of bed rails  Transfers Overall transfer level: Needs assistance Equipment used: Rolling walker (2 wheeled) Transfers: Sit to/from Stand Sit to Stand: Min guard         General transfer comment: MIn guard for safety and immediate standing balance; increase time and effort to complete successfully.   Ambulation/Gait Ambulation/Gait assistance: Min guard Gait Distance (Feet): 120 Feet Assistive device: Rolling walker (2 wheeled) Gait Pattern/deviations: Step-through pattern;Decreased stride length;Trunk flexed Gait velocity: decreased   General Gait Details: slow steady pace of gait; min guard only for safety; no LOB or overt instability  Stairs             Wheelchair Mobility    Modified Rankin (Stroke Patients Only)       Balance Overall balance assessment: Needs assistance Sitting-balance support: No upper extremity supported;Feet supported Sitting balance-Leahy Scale: Good     Standing balance support: Bilateral upper extremity supported;During functional activity Standing balance-Leahy Scale: Fair                               Pertinent Vitals/Pain Pain Assessment: Faces Faces Pain Scale: Hurts little more Pain Location: R great toe Pain Descriptors / Indicators: Aching;Grimacing;Guarding Pain Intervention(s): Limited activity within patient's tolerance;Monitored during session;Repositioned    Home Living Family/patient expects to be discharged to:: Private residence Living Arrangements: Children;Other relatives Available Help at Discharge: Family;Available PRN/intermittently Type of Home: Mobile home Home Access: Stairs to enter   Entrance Stairs-Number of Steps: 3-5 Home Layout: One level Home Equipment: Walker - 2 wheels;Walker - 4 wheels;Cane - single point;Bedside commode;Shower seat      Prior Function Level of Independence: Independent with assistive device(s)         Comments: use of SPC vs RW; reports HHPT services recently     Hand Dominance        Extremity/Trunk Assessment   Upper Extremity Assessment Upper Extremity Assessment: Defer to OT evaluation    Lower Extremity Assessment Lower Extremity Assessment: Generalized weakness    Cervical / Trunk Assessment Cervical / Trunk Assessment: Normal  Communication   Communication: No difficulties  Cognition Arousal/Alertness: Awake/alert Behavior During Therapy: WFL for tasks assessed/performed Overall Cognitive Status: Within Functional  Limits for tasks assessed                                        General Comments General comments (skin integrity, edema, etc.): R great toe with bandage -  slight discoloration of bandaging - nursing in room and seen     Exercises     Assessment/Plan    PT Assessment Patient needs continued PT services  PT Problem List Decreased strength;Decreased activity tolerance;Decreased balance;Decreased mobility;Decreased knowledge of use of DME;Decreased safety awareness       PT Treatment Interventions DME instruction;Gait training;Functional mobility training;Stair training;Therapeutic activities;Therapeutic exercise;Balance training;Patient/family education    PT Goals (Current goals can be found in the Care Plan section)  Acute Rehab PT Goals Patient Stated Goal: return home soon PT Goal Formulation: With patient Time For Goal Achievement: 09/20/18 Potential to Achieve Goals: Good    Frequency Min 3X/week   Barriers to discharge        Co-evaluation               AM-PAC PT "6 Clicks" Mobility  Outcome Measure Help needed turning from your back to your side while in a flat bed without using bedrails?: None Help needed moving from lying on your back to sitting on the side of a flat bed without using bedrails?: A Little Help needed moving to and from a bed to a chair (including a wheelchair)?: A Little Help needed standing up from a chair using your arms (e.g., wheelchair or bedside chair)?: A Little Help needed to walk in hospital room?: A Little Help needed climbing 3-5 steps with a railing? : A Little 6 Click Score: 19    End of Session Equipment Utilized During Treatment: Gait belt Activity Tolerance: Patient tolerated treatment well Patient left: in chair;with call bell/phone within reach Nurse Communication: Mobility status PT Visit Diagnosis: Unsteadiness on feet (R26.81);Other abnormalities of gait and mobility (R26.89);History of falling (Z91.81)    Time: 3338-3291 PT Time Calculation (min) (ACUTE ONLY): 22 min   Charges:   PT Evaluation $PT Eval Moderate Complexity: 1 Mod           Lanney Gins,  PT, DPT Supplemental Physical Therapist 09/06/18 10:30 AM Pager: [phone] Office: [phone]

## 2018-09-06 NOTE — Progress Notes (Signed)
[facility]    NEPHROLOGY PROGRESS NOTE  SUBJECTIVE: Feeling well today.  Is anxious to have his procedure on his leg   Denies chest pain, shortness of breath, nausea, vomiting, diarrhea or dysuria.  Does not have a great appetite this morning.  Denies fevers or chills.  All other review of systems are negative.   OBJECTIVE:  Vitals:   09/06/18 0817 09/06/18 1357  BP:  (!) 150/77  Pulse: 70 79  Resp:    Temp:  98.1 F (36.7 C)  SpO2: 96% 100%   I/O last 3 completed shifts: In: 990.2 [P.O.:240; I.V.:437.2; IV Piggyback:313] Out: 3625 [Urine:3625]   Gen[place]l:  AAOx3 NAD HEENT: MMM [place] AT anicteric sclera Neck:  No JVD, no adenopathy CV:  Heart RRR  Lungs:  L/S CTA bilaterally Abd:  abd SNT/ND with normal BS GU:  Bladder non-palpable Extremities: Trace bilateral lower extremity edema.  Right foot first toe gangrene-bandaged. Skin:  No skin rash  MEDICATIONS:   Current Facility-Administered Medications:  .  acetaminophen (TYLENOL) tablet 650 mg, 650 mg, Oral, Q6H PRN **OR** acetaminophen (TYLENOL) suppository 650 mg, 650 mg, Rectal, Q6H PRN, Rise Patience, MD .  albuterol (PROVENTIL) (2.5 MG/3ML) 0.083% nebulizer solution 2.5 mg, 2.5 mg, Nebulization, Q6H PRN, Rise Patience, MD .  aspirin EC tablet 81 mg, 81 mg, Oral, Daily, Waynetta Sandy, MD, 81 mg at 09/06/18 0945 .  atorvastatin (LIPITOR) tablet 40 mg, 40 mg, Oral, q1800, Rise Patience, MD, 40 mg at 09/05/18 1704 .  DAPTOmycin (CUBICIN) 650 mg in sodium chloride 0.9 % IVPB, 650 mg, Intravenous, Q2000, Lore, Melissa A, RPH, Last Rate: 226 mL/hr at 09/05/18 2013, 650 mg at 09/05/18 2013 .  feeding supplement (ENSURE ENLIVE) (ENSURE ENLIVE) liquid 237 mL, 237 mL, Oral, BID BM, Elgergawy, Silver Huguenin, MD, 237 mL at 09/06/18 1530 .  feeding supplement (PRO-STAT SUGAR FREE 64) liquid 30 mL, 30 mL, Oral, BID, Elgergawy, Silver Huguenin, MD, 30 mL at 09/06/18 0945 .  fentaNYL (SUBLIMAZE)  injection 25 mcg, 25 mcg, Intravenous, Q2H PRN, Rise Patience, MD .  heparin ADULT infusion 100 units/mL (25000 units/27mL sodium chloride 0.45%), 1,700 Units/hr, Intravenous, Continuous, Elgergawy, Silver Huguenin, MD, Last Rate: 17 mL/hr at 09/06/18 1540, 1,700 Units/hr at 09/06/18 1540 .  hydrALAZINE (APRESOLINE) injection 5 mg, 5 mg, Intravenous, Q4H PRN, Rise Patience, MD .  insulin aspart (novoLOG) injection 0-9 Units, 0-9 Units, Subcutaneous, Q4H, Rise Patience, MD, 5 Units at 09/06/18 1233 .  ipratropium (ATROVENT) nebulizer solution 0.5 mg, 0.5 mg, Nebulization, Q6H PRN, Rise Patience, MD .  meropenem (MERREM) 1 g in sodium chloride 0.9 % 100 mL IVPB, 1 g, Intravenous, Q8H, Elgergawy, Dawood S, MD .  metoprolol succinate (TOPROL-XL) 24 hr tablet 25 mg, 25 mg, Oral, Daily, Rise Patience, MD, 25 mg at 09/06/18 0945 .  mometasone-formoterol (DULERA) 200-5 MCG/ACT inhaler 2 puff, 2 puff, Inhalation, BID, Rise Patience, MD, 2 puff at 09/06/18 [phone] .  multivitamin with minerals tablet 1 tablet, 1 tablet, Oral, Daily, Elgergawy, Silver Huguenin, MD, 1 tablet at 09/06/18 0945 .  nitroGLYCERIN (NITROSTAT) SL tablet 0.4 mg, 0.4 mg, Sublingual, Q5 min PRN, Rise Patience, MD .  ondansetron [facility]) tablet 4 mg, 4 mg, Oral, Q6H PRN **OR** ondansetron (ZOFRAN) injection 4 mg, 4 mg, Intravenous, Q6H PRN, Rise Patience, MD .  oxyCODONE (Oxy IR/ROXICODONE) immediate release tablet 5 mg, 5 mg, Oral, Q6H PRN, Elgergawy, Silver Huguenin, MD, 5 mg at  09/06/18 1234 .  sodium chloride flush (NS) 0.9 % injection 10-40 mL, 10-40 mL, Intracatheter, PRN, Elgergawy, Silver Huguenin, MD     LABS:  CBC Latest Ref Rng & Units 09/06/2018 09/05/2018 09/04/2018  WBC 4.0 - 10.5 K/uL 10.0 10.2 9.5  Hemoglobin 13.0 - 17.0 g/dL 12.6(L) 12.8(L) 12.8(L)  Hematocrit 39.0 - 52.0 % 37.5(L) 38.7(L) 37.7(L)  Platelets 150 - 400 K/uL 317 309 289    CMP Latest Ref Rng & Units 09/06/2018 09/05/2018 09/04/2018   Glucose 70 - 99 mg/dL 146(H) 151(H) 173(H)  BUN 8 - 23 mg/dL 26(H) 34(H) 35(H)  Creatinine 0.61 - 1.24 mg/dL 1.30(H) 1.79(H) 2.11(H)  Sodium 135 - 145 mmol/L 139 136 136  Potassium 3.5 - 5.1 mmol/L 3.3(L) 3.9 3.4(L)  Chloride 98 - 111 mmol/L 106 104 102  CO2 22 - 32 mmol/L 24 22 25   Calcium 8.9 - 10.3 mg/dL 8.5(L) 8.6(L) 8.6(L)  Total Protein 6.5 - 8.1 g/dL - - -  Total Bilirubin 0.3 - 1.2 mg/dL - - -  Alkaline Phos 38 - 126 U/L - - -  AST 15 - 41 U/L - - -  ALT 0 - 44 U/L - - -    Lab Results  Component Value Date   CALCIUM 8.5 (L) 09/06/2018   CAION 1.28 01/30/2014       Component Value Date/Time   COLORURINE YELLOW 09/03/2018 1555   APPEARANCEUR HAZY (A) 09/03/2018 1555   LABSPEC 1.011 09/03/2018 1555   PHURINE 5.0 09/03/2018 1555   GLUCOSEU NEGATIVE 09/03/2018 1555   HGBUR SMALL (A) 09/03/2018 1555   BILIRUBINUR NEGATIVE 09/03/2018 1555   KETONESUR NEGATIVE 09/03/2018 1555   PROTEINUR NEGATIVE 09/03/2018 1555   UROBILINOGEN 0.2 01/30/2014 2134   NITRITE NEGATIVE 09/03/2018 1555   LEUKOCYTESUR NEGATIVE 09/03/2018 1555      Component Value Date/Time   PHART 7.464 (H) 01/18/2014 1340   PCO2ART 35.5 01/18/2014 1340   PO2ART 83.9 01/18/2014 1340   HCO3 25.1 (H) 01/18/2014 1340   TCO2 24 01/30/2014 1955   O2SAT 96.7 01/18/2014 1340    No results found for: IRON, TIBC, FERRITIN, IRONPCTSAT     ASSESSMENT/PLAN:     Problem List Items Addressed This Visit    None    Visit Diagnoses    Acute renal failure ([place])       Relevant Orders   US RENAL (Completed)      The patient is a 68 y.o. year old male with a PMHx of COPD, A-fib, DM and HTN who presents for evaluation of an ischemic right first toe and right leg.  His baseline creatinine appears to be normal at 1.2.  On admission, it was noted that his doppler pulse of the right foot was difficult to obtain.  He reports a several day history of nausea and vomiting while at the OSH and complains of thirst.  He noted  his urine to be very dark at that time.  His creatinine has worsened throughout his admission, prompting renal evaluation and delay of angiogram.      1.  Baseline creatinine 1.2 2.  AKI.  Likely due to volume depletion.  Renal US from 1/3 with no obstruction.    Urinalysis without blood or protein.    Creatinine is improving.  No role for additional IV fluids. 3.  RLE ischemia.  For angiogram this week.  Renal function back to baseline.  Will sign off.  Please call with questions.  Thanks.  [place], DO, [place]

## 2018-09-06 NOTE — Progress Notes (Signed)
PROGRESS NOTE                                                                                                                                                                                                             Patient Demographics:    Jon [name], is a 68 y.o. male, DOB - 05/17/1951, IRC:[id number]  Admit date - 09/01/2018   Admitting Physician Antonieta Pert, MD  Outpatient Primary MD for the patient is Cox, Elnita Maxwell, MD  LOS - 5   No chief complaint on file.      Brief Narrative   68 y.o. male with history of diabetes mellitus type 2, atrial fibrillation, hypertension, COPD, sleep apnea , admitted at [facility] for gangrene of his right great toe.  His blood culture grew MRSA, as well he was noted to have AKI, creatinine was 2.2 on admission, from baseline 1.2, had poor Doppler signals of the lower extremities, so he was transferred to [facility] for evaluation by vascular surgery, had worsening renal function, so his angiogram which was planned by vascular surgery was postponed, seen by renal, renal function is improving, as well seen by ID, who requested TEE for his MRSA bacteremia.    Subjective:    Jon [name] today has, No headache, No chest pain, No abdominal pain - No Nausea, no further constipation   Assessment  & Plan :    Principal Problem:   MRSA bacteremia Active Problems:   Hyperlipidemia   OSA on CPAP   Atrial fibrillation (HCC)   Chronic diastolic heart failure (HCC)   ARF (acute renal failure) (HCC)   Gangrene of toe of right foot (HCC)   Cellulitis of right foot   DM (diabetes mellitus), type 2 with renal complications (HCC)   Malnutrition of moderate degree   Right foot cellulitis with gangrenous great toe  -Empirically on IV vancomycin and Primaxin, the input greatly appreciated, vancomycin changed to daptomycin given worsening renal function, continue with Primaxin as is likely having mixed  aerobic/anaerobic process. -With peripheral vascular disease, vascular surgery input greatly appreciated, plan for angiogram , discussed with vascular surgery, they will arrange for this week.  MRSA bacteremia  -ID input appreciated, continue with daptomycinand Primaxin for now . (IV Vancomycin stopped given worsening renal function)  - As per the discharge summary from [facility] patient 2D  echo did not show any vegetations, will need TEE to rule out endocarditis, will be done tomorrow per cardiology  Acute renal failure -Baseline creatinine 1.2, peaked at 3.7, renal input is appreciated, felt to be secondary to ischemic ATN from volume depletion, dehydration, proving with IV fluids, it is at 1.3 today.  Diabetes mellitus type 2  - we will keep patient on sliding scale coverage  Peripheral vascular disease with having discolored foot on the left also. -Vascular surgery input greatly appreciated, patient will need angiogram to address revascularization prior to gangrene management, function has improved, plan for angiogram . -Vascular surgery following   paroxysmal atrial fibrillation with slow ventricular response -Heart rate currently controlled on telemetry, continue with metoprolol for heart rate control, Cardizem has been stopped on admission given bradycardia . -Xarelto on hold since admission and see patient for angiogram and surgical intervention, he is bridged with heparin drip   Hypertension  - on metoprolol,  hydralazine and Cardizem.  Holding Cozaar due to acute renal failure.  -Holding Cardizem for bradycardia -Blood pressure controlled  History of COPD  - not actively wheezing.  Continue inhalers.  Sleep apnea on CPAP.  History of nonhemorrhagic CVA  - on anticoagulation and statins.      Code Status : Full  Family Communication  : None at bedside  Disposition Plan  : Pending further work-up  Consults  :  Vascular surgery, renal  Procedures  : none  DVT  Prophylaxis  :  Heparin GTT  Lab Results  Component Value Date   PLT 317 09/06/2018    Antibiotics  :    Anti-infectives (From admission, onward)   Start     Dose/Rate Route Frequency Ordered Stop   09/05/18 2000  DAPTOmycin (CUBICIN) 650 mg in sodium chloride 0.9 % IVPB  Status:  Discontinued     650 mg 226 mL/hr over 30 Minutes Intravenous Every 48 hours 09/04/18 0310 09/04/18 1531   09/05/18 2000  DAPTOmycin (CUBICIN) 650 mg in sodium chloride 0.9 % IVPB     650 mg 226 mL/hr over 30 Minutes Intravenous Daily 09/04/18 1531     09/04/18 0800  DAPTOmycin (CUBICIN) 650 mg in sodium chloride 0.9 % IVPB     650 mg 226 mL/hr over 30 Minutes Intravenous  Once 09/04/18 0310 09/04/18 1521   09/03/18 0914  vancomycin variable dose per unstable renal function (pharmacist dosing)  Status:  Discontinued      Does not apply See admin instructions 09/03/18 0915 09/04/18 0310   09/02/18 1000  meropenem (MERREM) 1 g in sodium chloride 0.9 % 100 mL IVPB     1 g 200 mL/hr over 30 Minutes Intravenous Every 12 hours 09/02/18 0017     09/02/18 0030  vancomycin (VANCOCIN) 1,750 mg in sodium chloride 0.9 % 500 mL IVPB  Status:  Discontinued     1,750 mg 250 mL/hr over 120 Minutes Intravenous Every 48 hours 09/02/18 0017 09/03/18 0915   09/01/18 2300  vancomycin (VANCOCIN) 2,000 mg in sodium chloride 0.9 % 500 mL IVPB  Status:  Discontinued     2,000 mg 250 mL/hr over 120 Minutes Intravenous  Once 09/01/18 2215 09/01/18 2353   09/01/18 2230  meropenem (MERREM) 2 g in sodium chloride 0.9 % 100 mL IVPB     2 g 200 mL/hr over 30 Minutes Intravenous  Once 09/01/18 2215 09/01/18 2346        Objective:   Vitals:   09/05/18 2229 09/05/18 2325 09/06/18 0435  09/06/18 0817  BP: (!) 168/76  (!) 147/79   Pulse: (!) 52 66 (!) 46 70  Resp:      Temp: 98.5 F (36.9 C)  98 F (36.7 C)   TempSrc: Oral  Oral   SpO2: 98% 97% 97% 96%  Weight:      Height:        Wt Readings from Last 3 Encounters:    09/01/18 106.6 kg  12/07/17 124.7 kg  05/25/17 128.5 kg     Intake/Output Summary (Last 24 hours) at 09/06/2018 1142 Last data filed at 09/06/2018 0551 Gross per 24 hour  Intake 387 ml  Output 1725 ml  Net -1338 ml     Physical Exam  Awake Alert, Oriented X 3, No new F.N deficits, Normal affect Symmetrical Chest wall movement, Good air movement bilaterally, CTAB RRR,No Gallops,Rubs or new Murmurs, No Parasternal Heave +ve B.Sounds, Abd Soft, No tenderness, No rebound - guarding or rigidity.   erythema of the right foot, extending from the mid calf up to the right foot, with gangrenous right great toe    Data Review:    CBC Recent Labs  Lab 09/01/18 2157 09/02/18 0351 09/03/18 0240 09/04/18 1016 09/05/18 0249 09/06/18 0337  WBC 12.4* 11.0* 10.2 9.5 10.2 10.0  HGB 12.7* 11.4* 11.8* 12.8* 12.8* 12.6*  HCT 38.5* 33.7* 34.3* 37.7* 38.7* 37.5*  PLT 314 253 253 289 309 317  MCV 87.9 88.7 85.5 86.7 88.2 86.0  MCH 29.0 30.0 29.4 29.4 29.2 28.9  MCHC 33.0 33.8 34.4 34.0 33.1 33.6  RDW 14.1 14.3 14.4 14.4 14.4 14.3  LYMPHSABS 1.1  --   --   --   --   --   MONOABS 0.8  --   --   --   --   --   EOSABS 0.0  --   --   --   --   --   BASOSABS 0.0  --   --   --   --   --     Chemistries  Recent Labs  Lab 09/01/18 2157 09/02/18 0351 09/03/18 0240 09/04/18 1016 09/05/18 0249 09/06/18 0337  NA 139 138 137 136 136 139  K 4.4 4.4 3.8 3.4* 3.9 3.3*  CL 102 104 103 102 104 106  CO2 25 25 23 25 22 24   GLUCOSE 147* 161* 137* 173* 151* 146*  BUN 34* 27* 34* 35* 34* 26*  CREATININE 2.95* 3.33* 3.75* 2.11* 1.79* 1.30*  CALCIUM 8.8* 8.4* 8.6* 8.6* 8.6* 8.5*  AST 58*  --   --   --   --   --   ALT 32  --   --   --   --   --   ALKPHOS 137*  --   --   --   --   --   BILITOT 1.5*  --   --   --   --   --    ------------------------------------------------------------------------------------------------------------------ No results for input(s): CHOL, HDL, LDLCALC, TRIG, CHOLHDL,  LDLDIRECT in the last 72 hours.  Lab Results  Component Value Date   HGBA1C 8.1 (H) 01/31/2014   ------------------------------------------------------------------------------------------------------------------ No results for input(s): TSH, T4TOTAL, T3FREE, THYROIDAB in the last 72 hours.  Invalid input(s): FREET3 ------------------------------------------------------------------------------------------------------------------ No results for input(s): VITAMINB12, FOLATE, FERRITIN, TIBC, IRON, RETICCTPCT in the last 72 hours.  Coagulation profile Recent Labs  Lab 09/01/18 2157  INR 1.68    No results for input(s): DDIMER in the last 72 hours.  Cardiac Enzymes  No results for input(s): CKMB, TROPONINI, MYOGLOBIN in the last 168 hours.  Invalid input(s): CK ------------------------------------------------------------------------------------------------------------------    Component Value Date/Time   BNP 66.7 11/29/2014 1410    Inpatient Medications  Scheduled Meds: . aspirin EC  81 mg Oral Daily  . atorvastatin  40 mg Oral q1800  . feeding supplement (ENSURE ENLIVE)  237 mL Oral BID BM  . feeding supplement (PRO-STAT SUGAR FREE 64)  30 mL Oral BID  . insulin aspart  0-9 Units Subcutaneous Q4H  . metoprolol succinate  25 mg Oral Daily  . mometasone-formoterol  2 puff Inhalation BID  . multivitamin with minerals  1 tablet Oral Daily   Continuous Infusions: . DAPTOmycin (CUBICIN)  IV 650 mg (09/05/18 2013)  . heparin 1,600 Units/hr (09/06/18 0347)  . meropenem (MERREM) IV 1 g (09/06/18 0936)   PRN Meds:.acetaminophen **OR** acetaminophen, albuterol, fentaNYL (SUBLIMAZE) injection, hydrALAZINE, ipratropium, nitroGLYCERIN, ondansetron **OR** ondansetron (ZOFRAN) IV, oxyCODONE, sodium chloride flush  Micro Results Recent Results (from the past 240 hour(s))  Culture, blood (Routine X 2) w Reflex to ID Panel     Status: None (Preliminary result)   Collection Time: 09/02/18   2:24 PM  Result Value Ref Range Status   Specimen Description BLOOD BLOOD LEFT HAND  Final   Special Requests AEROBIC BOTTLE ONLY Blood Culture adequate volume  Final   Culture NO GROWTH 3 DAYS  Final   Report Status PENDING  Incomplete  Culture, blood (Routine X 2) w Reflex to ID Panel     Status: None (Preliminary result)   Collection Time: 09/02/18  2:24 PM  Result Value Ref Range Status   Specimen Description BLOOD BLOOD LEFT HAND  Final   Special Requests AEROBIC BOTTLE ONLY Blood Culture adequate volume  Final   Culture NO GROWTH 3 DAYS  Final   Report Status PENDING  Incomplete    Radiology Reports US Renal  Result Date: 09/03/2018 CLINICAL DATA:  Acute renal failure. EXAM: RENAL / URINARY TRACT ULTRASOUND COMPLETE COMPARISON:  Ultrasound of June 14, 2009. FINDINGS: Right Kidney: Renal measurements: 13.8 x 5.4 x 4.9 cm = volume: 192 mL . Echogenicity within normal limits. No mass or hydronephrosis visualized. Left Kidney: Renal measurements: 12.3 x 5.8 x 5.0 cm = volume: 202 mL. Echogenicity within normal limits. No mass or hydronephrosis visualized. Bladder: Decompressed secondary to Foley catheter. IMPRESSION: No renal abnormality seen. Electronically Signed   By: Marijo Conception, M.D.   On: 09/03/2018 10:12   Vas [place] Burnard Bunting With/wo Tbi  Result Date: 09/02/2018 LOWER EXTREMITY DOPPLER STUDY Indications: Peripheral artery disease.  Performing Technologist: Carlos Levering RVT  Examination Guidelines: A complete evaluation includes at minimum, Doppler waveform signals and systolic blood pressure reading at the level of bilateral brachial, anterior tibial, and posterior tibial arteries, when vessel segments are accessible. Bilateral testing is considered an integral part of a complete examination. Photoelectric Plethysmograph (PPG) waveforms and toe systolic pressure readings are included as required and additional duplex testing as needed. Limited examinations for reoccurring indications may  be performed as noted.  ABI Findings: +---------+------------------+-----+---------+--------+ Right    Rt Pressure (mmHg)IndexWaveform Comment  +---------+------------------+-----+---------+--------+ Brachial 101                    triphasic         +---------+------------------+-----+---------+--------+ PTA      152               1.50 biphasic          +---------+------------------+-----+---------+--------+  DP       104               1.03 biphasic          +---------+------------------+-----+---------+--------+ Great Toe                                Gangrene +---------+------------------+-----+---------+--------+ +---------+------------------+-----+---------+-------+ Left     Lt Pressure (mmHg)IndexWaveform Comment +---------+------------------+-----+---------+-------+ Brachial                                 IV      +---------+------------------+-----+---------+-------+ PTA      254               2.51 triphasic        +---------+------------------+-----+---------+-------+ DP       191               1.89 triphasic        +---------+------------------+-----+---------+-------+ Great Toe76                0.75                  +---------+------------------+-----+---------+-------+ +-------+-----------+-----------+------------+------------+ ABI/TBIToday's ABIToday's TBIPrevious ABIPrevious TBI +-------+-----------+-----------+------------+------------+ Right  1.5                                            +-------+-----------+-----------+------------+------------+ Left   2.51       0.75                                +-------+-----------+-----------+------------+------------+ Right ankle cuff was placed on proximal calf for patient comfort.  Summary: Right: Resting right ankle-brachial index indicates noncompressible right lower extremity arteries. Right ankle cuff was placed on proximal calf for patient comfort. Unable to obtain toe  pressure due to gangrenous toe. Left: Resting left ankle-brachial index indicates noncompressible left lower extremity arteries.The left toe-brachial index is normal.  *See table(s) above for measurements and observations.  Electronically signed by Curt Jews MD on 09/02/2018 at 5:34:17 PM.   Final      Phillips Climes M.D on 09/06/2018 at 11:42 AM  Between 7am to 7pm - Pager - [phone]  After 7pm go to [email or web address] - password [facility]  [facility] -  Office  [phone]

## 2018-09-06 NOTE — H&P (View-Only) (Signed)
Patient ID: Jon [name], male   DOB: [birth year], 68 y.o.   MRN: [id number]         [facility] for Infectious Disease  Date of Admission:  09/01/2018   Total days of antibiotics 8         ASSESSMENT: Jon [name] has MRSA bacteremia complicating ischemia and infection of his right great toe.  Jon [name] is improving.  His renal function is improving but I will continue daptomycin and meropenem for now.  PLAN: 1. Continue current antibiotics 2. Await results of TEE and arteriogram  Principal Problem:   MRSA bacteremia Active Problems:   Gangrene of toe of right foot (HCC)   Hyperlipidemia   OSA on CPAP   Atrial fibrillation (HCC)   Chronic diastolic heart failure (HCC)   ARF (acute renal failure) (HCC)   Cellulitis of right foot   DM (diabetes mellitus), type 2 with renal complications (HCC)   Malnutrition of moderate degree   Scheduled Meds: . aspirin EC  81 mg Oral Daily  . atorvastatin  40 mg Oral q1800  . feeding supplement (ENSURE ENLIVE)  237 mL Oral BID BM  . feeding supplement (PRO-STAT SUGAR FREE 64)  30 mL Oral BID  . insulin aspart  0-9 Units Subcutaneous Q4H  . metoprolol succinate  25 mg Oral Daily  . mometasone-formoterol  2 puff Inhalation BID  . multivitamin with minerals  1 tablet Oral Daily   Continuous Infusions: . DAPTOmycin (CUBICIN)  IV 650 mg (09/05/18 2013)  . heparin 1,700 Units/hr (09/06/18 1540)  . meropenem (MERREM) IV     PRN Meds:.acetaminophen **OR** acetaminophen, albuterol, fentaNYL (SUBLIMAZE) injection, hydrALAZINE, ipratropium, nitroGLYCERIN, ondansetron **OR** ondansetron (ZOFRAN) IV, oxyCODONE, sodium chloride flush   SUBJECTIVE: Jon [name] is feeling better.  Jon [name] is having some pain in his right great toe.  Jon [name] was able to get up and walk in the hall today.  Review of Systems: Review of Systems  Constitutional: Negative for chills, diaphoresis and fever.  Musculoskeletal: Positive for joint pain.    Allergies  Allergen Reactions  .  Hydrocodone Itching  . Morphine Itching  . Duloxetine Hcl Other (See Comments)  . Codeine Itching  . Penicillins Itching and Rash    DID THE REACTION INVOLVE: Swelling of the face/tongue/throat, SOB, or low BP? No Sudden or severe rash/hives, skin peeling, or the inside of the mouth or nose? No Did it require medical treatment? No When did it last happen?unknown If all above answers are "NO", may proceed with cephalosporin use.     OBJECTIVE: Vitals:   09/05/18 2325 09/06/18 0435 09/06/18 0817 09/06/18 1357  BP:  (!) 147/79  (!) 150/77  Pulse: 66 (!) 46 70 79  Resp:      Temp:  98 F (36.7 C)  98.1 F (36.7 C)  TempSrc:  Oral  Oral  SpO2: 97% 97% 96% 100%  Weight:      Height:       Body mass index is 36.81 kg/m.  Physical Exam Constitutional:      Comments: Jon [name] is worried about losing his right great toe and is anxious about his upcoming procedures.  Cardiovascular:     Rate and Rhythm: Normal rate and regular rhythm.     Heart sounds: No murmur.  Pulmonary:     Effort: Pulmonary effort is normal.  Musculoskeletal:     Comments: Dry gangrene of his right great toe persist.  Jon [name] is having some bloody drainage.  There is no odor.  Lab Results Lab Results  Component Value Date   WBC 10.0 09/06/2018   HGB 12.6 (L) 09/06/2018   HCT 37.5 (L) 09/06/2018   MCV 86.0 09/06/2018   PLT 317 09/06/2018    Lab Results  Component Value Date   CREATININE 1.30 (H) 09/06/2018   BUN 26 (H) 09/06/2018   NA 139 09/06/2018   K 3.3 (L) 09/06/2018   CL 106 09/06/2018   CO2 24 09/06/2018    Lab Results  Component Value Date   ALT 32 09/01/2018   AST 58 (H) 09/01/2018   ALKPHOS 137 (H) 09/01/2018   BILITOT 1.5 (H) 09/01/2018     Microbiology: Recent Results (from the past 240 hour(s))  Culture, blood (Routine X 2) w Reflex to ID Panel     Status: None (Preliminary result)   Collection Time: 09/02/18  2:24 PM  Result Value Ref Range Status   Specimen Description  BLOOD BLOOD LEFT HAND  Final   Special Requests AEROBIC BOTTLE ONLY Blood Culture adequate volume  Final   Culture NO GROWTH 4 DAYS  Final   Report Status PENDING  Incomplete  Culture, blood (Routine X 2) w Reflex to ID Panel     Status: None (Preliminary result)   Collection Time: 09/02/18  2:24 PM  Result Value Ref Range Status   Specimen Description BLOOD BLOOD LEFT HAND  Final   Special Requests AEROBIC BOTTLE ONLY Blood Culture adequate volume  Final   Culture NO GROWTH 4 DAYS  Final   Report Status PENDING  Incomplete    Michel Bickers, MD [place] [facility] [place] [phone] pager   [phone] cell 09/06/2018, 3:44 PM

## 2018-09-06 NOTE — Progress Notes (Signed)
ANTICOAGULATION CONSULT NOTE  Pharmacy Consult:  Heparin Indication: atrial fibrillation  Allergies  Allergen Reactions  . Hydrocodone Itching  . Morphine Itching  . Duloxetine Hcl Other (See Comments)  . Codeine Itching  . Penicillins Itching and Rash    DID THE REACTION INVOLVE: Swelling of the face/tongue/throat, SOB, or low BP? No Sudden or severe rash/hives, skin peeling, or the inside of the mouth or nose? No Did it require medical treatment? No When did it last happen?unknown If all above answers are "NO", may proceed with cephalosporin use.    Patient Measurements: Height: 5\' 7"  (170.2 cm) Weight: 235 lb (106.6 kg) IBW/kg (Calculated) : 66.1  Heparin dosing weight = 90 kg  Vital Signs: Temp: 98 F (36.7 C) (01/06 0435) Temp Source: Oral (01/06 0435) BP: 147/79 (01/06 0435) Pulse Rate: 46 (01/06 0435)  Labs: Recent Labs    09/03/18 1154  09/04/18 1016 09/05/18 0249 09/05/18 1428 09/06/18 0337  HGB  --    < > 12.8* 12.8*  --  12.6*  HCT  --   --  37.7* 38.7*  --  37.5*  PLT  --   --  289 309  --  317  APTT 66*  --  74*  --   --   --   HEPARINUNFRC  --    < > 0.61 0.22* 0.32 0.18*  CREATININE  --   --  2.11* 1.79*  --   --    < > = values in this interval not displayed.    Estimated Creatinine Clearance: 45.3 mL/min (A) (by C-G formula based on SCr of 1.79 mg/dL (H)).  Assessment: 31 YOM continues on IV heparin for history of Afib while Xarelto is on hold.  Heparin level (0.61) and aPTT now correlated (74). Will now monitor using heparin levels alone. No bleeding or infusion issues noted.  1/6 AM update: heparin level low at 0.18, no issues per RN.   Goal of Therapy:  Heparin level 0.3-0.7 units/ml Monitor platelets by anticoagulation protocol: Yes    Plan:  Inc heparin to 1600 units/hr 1400 HL  Narda Bonds, PharmD, BCPS Clinical Pharmacist Phone: [phone]

## 2018-09-06 NOTE — Progress Notes (Addendum)
ANTICOAGULATION CONSULT NOTE  Pharmacy Consult:  Heparin Indication: atrial fibrillation  Allergies  Allergen Reactions  . Hydrocodone Itching  . Morphine Itching  . Duloxetine Hcl Other (See Comments)  . Codeine Itching  . Penicillins Itching and Rash    DID THE REACTION INVOLVE: Swelling of the face/tongue/throat, SOB, or low BP? No Sudden or severe rash/hives, skin peeling, or the inside of the mouth or nose? No Did it require medical treatment? No When did it last happen?unknown If all above answers are "NO", may proceed with cephalosporin use.    Patient Measurements: Height: 5\' 7"  (170.2 cm) Weight: 235 lb (106.6 kg) IBW/kg (Calculated) : 66.1  Heparin dosing weight = 90 kg  Vital Signs: Temp: 98.1 F (36.7 C) (01/06 1357) Temp Source: Oral (01/06 1357) BP: 150/77 (01/06 1357) Pulse Rate: 79 (01/06 1357)  Labs: Recent Labs    09/04/18 1016 09/05/18 0249 09/05/18 1428 09/06/18 0337 09/06/18 1408  HGB 12.8* 12.8*  --  12.6*  --   HCT 37.7* 38.7*  --  37.5*  --   PLT 289 309  --  317  --   APTT 74*  --   --   --   --   HEPARINUNFRC 0.61 0.22* 0.32 0.18* 0.30  CREATININE 2.11* 1.79*  --  1.30*  --     Estimated Creatinine Clearance: 62.4 mL/min (A) (by C-G formula based on SCr of 1.3 mg/dL (H)).  Assessment: 29 YOM continues on IV heparin for history of Afib while Xarelto is on hold.  Heparin level came back low this AM. Rate was increase and level came back ok this PM. Will increase slightly due to its on the lower end of therapeutic.   Goal of Therapy:  Heparin level 0.3-0.7 units/ml Monitor platelets by anticoagulation protocol: Yes   Plan:   Increase heparin drip to 1700 units/hr F/u with AM HL  Onnie Boer, PharmD, BCIDP, AAHIVP, CPP Infectious Disease Pharmacist 09/06/2018 3:33 PM

## 2018-09-07 ENCOUNTER — Inpatient Hospital Stay (HOSPITAL_COMMUNITY): Payer: Medicare Other

## 2018-09-07 ENCOUNTER — Encounter (HOSPITAL_COMMUNITY)
Admission: AD | Disposition: A | Discharge status: Home Health | Payer: Self-pay | Source: Other Acute Inpatient Hospital | Attending: Internal Medicine

## 2018-09-07 ENCOUNTER — Inpatient Hospital Stay: Payer: Self-pay

## 2018-09-07 ENCOUNTER — Encounter (HOSPITAL_COMMUNITY): Payer: Self-pay | Admitting: *Deleted

## 2018-09-07 DIAGNOSIS — I351 Nonrheumatic aortic (valve) insufficiency: Secondary | ICD-10-CM

## 2018-09-07 DIAGNOSIS — I340 Nonrheumatic mitral (valve) insufficiency: Secondary | ICD-10-CM

## 2018-09-07 DIAGNOSIS — R7881 Bacteremia: Secondary | ICD-10-CM

## 2018-09-07 HISTORY — PX: TEE WITHOUT CARDIOVERSION: SHX5443

## 2018-09-07 LAB — CBC
HCT: 37.4 % — ABNORMAL LOW (ref 39.0–52.0)
Hemoglobin: 12.5 g/dL — ABNORMAL LOW (ref 13.0–17.0)
MCH: 29.3 pg (ref 26.0–34.0)
MCHC: 33.4 g/dL (ref 30.0–36.0)
MCV: 87.6 fL (ref 80.0–100.0)
NRBC: 0 % (ref 0.0–0.2)
Platelets: 317 10*3/uL (ref 150–400)
RBC: 4.27 MIL/uL (ref 4.22–5.81)
RDW: 14.4 % (ref 11.5–15.5)
WBC: 10.4 10*3/uL (ref 4.0–10.5)

## 2018-09-07 LAB — GLUCOSE, CAPILLARY
Glucose-Capillary: 136 mg/dL — ABNORMAL HIGH (ref 70–99)
Glucose-Capillary: 141 mg/dL — ABNORMAL HIGH (ref 70–99)
Glucose-Capillary: 162 mg/dL — ABNORMAL HIGH (ref 70–99)
Glucose-Capillary: 165 mg/dL — ABNORMAL HIGH (ref 70–99)
Glucose-Capillary: 191 mg/dL — ABNORMAL HIGH (ref 70–99)
Glucose-Capillary: 225 mg/dL — ABNORMAL HIGH (ref 70–99)

## 2018-09-07 LAB — CULTURE, BLOOD (ROUTINE X 2)
CULTURE: NO GROWTH
Culture: NO GROWTH
Special Requests: ADEQUATE
Special Requests: ADEQUATE

## 2018-09-07 LAB — HEPARIN LEVEL (UNFRACTIONATED): Heparin Unfractionated: 0.5 IU/mL (ref 0.30–0.70)

## 2018-09-07 SURGERY — ECHOCARDIOGRAM, TRANSESOPHAGEAL
Anesthesia: Moderate Sedation

## 2018-09-07 MED ORDER — BUTAMBEN-TETRACAINE-BENZOCAINE 2-2-14 % EX AERO
INHALATION_SPRAY | CUTANEOUS | Status: DC | PRN
  Administered 2018-09-07: 2 via TOPICAL

## 2018-09-07 MED ORDER — MIDAZOLAM HCL (PF) 5 MG/ML IJ SOLN
INTRAMUSCULAR | Status: AC
  Filled 2018-09-07: qty 2

## 2018-09-07 MED ORDER — SODIUM CHLORIDE 0.9% FLUSH
10.0000 mL | Freq: Two times a day (BID) | INTRAVENOUS | Status: DC
  Administered 2018-09-08 – 2018-09-10 (×4): 10 mL
  Administered 2018-09-10: 20 mL

## 2018-09-07 MED ORDER — SODIUM CHLORIDE 0.9% FLUSH
10.0000 mL | INTRAVENOUS | Status: DC | PRN
  Administered 2018-09-11: 10 mL
  Filled 2018-09-07: qty 40

## 2018-09-07 MED ORDER — FENTANYL CITRATE (PF) 100 MCG/2ML IJ SOLN
INTRAMUSCULAR | Status: AC
  Filled 2018-09-07: qty 2

## 2018-09-07 MED ORDER — VANCOMYCIN HCL 10 G IV SOLR
1250.0000 mg | INTRAVENOUS | Status: DC
  Administered 2018-09-09 – 2018-09-11 (×3): 1250 mg via INTRAVENOUS
  Filled 2018-09-07 (×4): qty 1250

## 2018-09-07 MED ORDER — MIDAZOLAM HCL (PF) 10 MG/2ML IJ SOLN
INTRAMUSCULAR | Status: DC | PRN
  Administered 2018-09-07 (×2): 2 mg via INTRAVENOUS

## 2018-09-07 MED ORDER — FENTANYL CITRATE (PF) 100 MCG/2ML IJ SOLN
INTRAMUSCULAR | Status: DC | PRN
  Administered 2018-09-07 (×2): 25 ug via INTRAVENOUS

## 2018-09-07 MED ORDER — VANCOMYCIN HCL 10 G IV SOLR
2000.0000 mg | Freq: Once | INTRAVENOUS | Status: AC
  Administered 2018-09-07: 2000 mg via INTRAVENOUS
  Filled 2018-09-07: qty 2000

## 2018-09-07 NOTE — Progress Notes (Signed)
Pharmacy Antibiotic Note  Jon [name] is a 68 y.o. male admitted on 09/01/2018 with MRSA bacteremia secondary to ischemia and infection of right great toe. Pharmacy has been consulted for vancomycin  Dosing. WBC WNL and patient afebrile. TEE today negative for vegetations. Patient has been treated with daptomycin due to AKI but will now switch back to vancomycin due to rapid improvement in SCr. SCr yesterday 1.30. Last dose of Vanc was 1/2.   Plan: Vancomycin 2 gm X 1  Then 1250 mg every 24 hours Predicted AUC 509 using SCr 1.30 Monitor renal function, levels as appropriate  Will need OPAT prior to discharge   Height: 5\' 7"  (170.2 cm) Weight: 235 lb (106.6 kg) IBW/kg (Calculated) : 66.1  Temp (24hrs), Avg:98.3 F (36.8 C), Min:98 F (36.7 C), Max:98.8 F (37.1 C)  Recent Labs  Lab 09/02/18 0351 09/03/18 0240 09/03/18 2243 09/04/18 1016 09/05/18 0249 09/06/18 0337 09/07/18 0532  WBC 11.0* 10.2  --  9.5 10.2 10.0 10.4  CREATININE 3.33* 3.75*  --  2.11* 1.79* 1.30*  --   VANCORANDOM  --   --  15  --   --   --   --     Estimated Creatinine Clearance: 62.4 mL/min (A) (by C-G formula based on SCr of 1.3 mg/dL (H)).    Allergies  Allergen Reactions  . Hydrocodone Itching  . Morphine Itching  . Duloxetine Hcl Other (See Comments)  . Codeine Itching  . Penicillins Itching and Rash    DID THE REACTION INVOLVE: Swelling of the face/tongue/throat, SOB, or low BP? No Sudden or severe rash/hives, skin peeling, or the inside of the mouth or nose? No Did it require medical treatment? No When did it last happen?unknown If all above answers are "NO", may proceed with cephalosporin use.     Antimicrobials this admission: Dapto 1/4 >>1/7 Vanc 12/30 >>1/4, 1/7>> Merrem 1/1 >> 1/7 Primaxin PTA 12/30 >> 1/1    Microbiology results: 1/2 BCx: NGTD   Thank you for allowing pharmacy to be a part of this patient's care.  Jimmy Footman, PharmD, BCPS, [place] [facility] Clinical Pharmacist Phone: [phone] 09/07/2018 12:33 PM

## 2018-09-07 NOTE — CV Procedure (Signed)
    Transesophageal Echocardiogram Note  Jon [name] [id number] 06-05-1951  Procedure: Transesophageal Echocardiogram Indications: bacteremia   Procedure Details Consent: Obtained Time Out: Verified patient identification, verified procedure, site/side was marked, verified correct patient position, special equipment/implants available, Radiology Safety Procedures followed,  medications/allergies/relevent history reviewed, required imaging and test results available.  Performed  Medications:  During this procedure the patient is administered a total of Versed 4  mg and Fentanyl 50  mcg  to achieve and maintain moderate conscious sedation.  The patient's heart rate, blood pressure, and oxygen saturation are monitored continuously during the procedure. The period of conscious sedation is  30  minutes, of which I was present face-to-face 100% of this time.  Left Ventrical:  Normal LV function   Mitral Valve: no vegetation   Aortic Valve: no vegetation   Tricuspid Valve: no vegetation   Pulmonic Valve: not well visualized,  No obvious vegetation   Left Atrium/ Left atrial appendage: no thrombi,  Significant spontaneous contrast in the LA and LAA   Atrial septum: no PFO or ASD  By color flow   Aorta: normal    Complications: No apparent complications Patient did tolerate procedure well.   Thayer Headings, [name]., MD, [facility] 09/07/2018, 9:49 AM

## 2018-09-07 NOTE — Progress Notes (Signed)
PROGRESS NOTE                                                                                                                                                                                                             Patient Demographics:    Jon [name], is a 68 y.o. male, DOB - 04-15-1951, ZOX:[id number]  Admit date - 09/01/2018   Admitting Physician Antonieta Pert, MD  Outpatient Primary MD for the patient is Cox, Elnita Maxwell, MD  LOS - 6   No chief complaint on file.      Brief Narrative   68 y.o. male with history of diabetes mellitus type 2, atrial fibrillation, hypertension, COPD, sleep apnea , admitted at [facility] for gangrene of his right great toe.  His blood culture grew MRSA, as well he was noted to have AKI, creatinine was 2.2 on admission, from baseline 1.2, had poor Doppler signals of the lower extremities, so he was transferred to [facility] for evaluation by vascular surgery, had worsening renal function, so his angiogram which was planned by vascular surgery was postponed, seen by renal, renal function is improving, as well seen by ID, who requested TEE for his MRSA bacteremia.    Subjective:    Jon [name] today has, No headache, No chest pain, No abdominal pain - No Nausea, no further constipation   Assessment  & Plan :    Principal Problem:   MRSA bacteremia Active Problems:   Hyperlipidemia   OSA on CPAP   Atrial fibrillation (HCC)   Chronic diastolic heart failure (HCC)   ARF (acute renal failure) (HCC)   Gangrene of toe of right foot (HCC)   Cellulitis of right foot   DM (diabetes mellitus), type 2 with renal complications (HCC)   Malnutrition of moderate degree   Right foot cellulitis with gangrenous great toe  -Empirically on IV vancomycin and Primaxin, the input greatly appreciated, vancomycin changed to daptomycin given worsening renal function, continue with Primaxin as is likely having mixed  aerobic/anaerobic process, antibiotic regimen has been changed back to vancomycin, please see discussion below. -With peripheral vascular disease, vascular surgery input greatly appreciated, for angiogram tomorrow.  MRSA bacteremia  -ID input appreciated, continue with daptomycinand Primaxin for now . (IV Vancomycin stopped given worsening renal function)  - As per the discharge summary from [facility] patient  2D echo did not show any vegetations, TEE performed 09/07/2018, with no evidence of endocarditis . -Condition per ID to treat for total of 3 weeks of IV vancomycin, stop date 09/19/2018, will request line insertion -surveillance blood culture 09/02/2018 with no growth to date  Acute renal failure -Baseline creatinine 1.2, peaked at 3.7, renal input is appreciated, felt to be secondary to ischemic ATN from volume depletion, dehydration,  -Renal input greatly appreciated, signed off, renal failure resolved  Diabetes mellitus type 2  - we will keep patient on sliding scale coverage  Peripheral vascular disease with having discolored foot on the left also. -Vascular surgery input greatly appreciated, patient will need angiogram to address revascularization prior to gangrene management, function has improved, plan for angiogram . -Vascular surgery following   paroxysmal atrial fibrillation with slow ventricular response -Heart rate currently controlled on telemetry, continue with metoprolol for heart rate control, Cardizem has been stopped on admission given bradycardia . -Xarelto on hold since admission and see patient for angiogram and surgical intervention, he is bridged with heparin drip   Hypertension  - on metoprolol,  hydralazine and Cardizem.  Holding Cozaar due to acute renal failure.  -Holding Cardizem for bradycardia -Blood pressure controlled  History of COPD  - not actively wheezing.  Continue inhalers.  Sleep apnea on CPAP.  History of nonhemorrhagic CVA  - on  anticoagulation and statins.      Code Status : Full  Family Communication  : None at bedside  Disposition Plan  : Pending further work-up  Consults  :  Vascular surgery, renal  Procedures  : TEE 09/07/2018  DVT Prophylaxis  :  Heparin GTT  Lab Results  Component Value Date   PLT 317 09/07/2018    Antibiotics  :    Anti-infectives (From admission, onward)   Start     Dose/Rate Route Frequency Ordered Stop   09/08/18 1600  vancomycin (VANCOCIN) 1,250 mg in sodium chloride 0.9 % 250 mL IVPB     1,250 mg 166.7 mL/hr over 90 Minutes Intravenous Every 24 hours 09/07/18 1244     09/07/18 1600  vancomycin (VANCOCIN) 2,000 mg in sodium chloride 0.9 % 500 mL IVPB     2,000 mg 250 mL/hr over 120 Minutes Intravenous  Once 09/07/18 1244     09/06/18 1700  meropenem (MERREM) 1 g in sodium chloride 0.9 % 100 mL IVPB  Status:  Discontinued     1 g 200 mL/hr over 30 Minutes Intravenous Every 8 hours 09/06/18 1246 09/07/18 1233   09/05/18 2000  DAPTOmycin (CUBICIN) 650 mg in sodium chloride 0.9 % IVPB  Status:  Discontinued     650 mg 226 mL/hr over 30 Minutes Intravenous Every 48 hours 09/04/18 0310 09/04/18 1531   09/05/18 2000  DAPTOmycin (CUBICIN) 650 mg in sodium chloride 0.9 % IVPB  Status:  Discontinued     650 mg 226 mL/hr over 30 Minutes Intravenous Daily 09/04/18 1531 09/07/18 1233   09/04/18 0800  DAPTOmycin (CUBICIN) 650 mg in sodium chloride 0.9 % IVPB     650 mg 226 mL/hr over 30 Minutes Intravenous  Once 09/04/18 0310 09/04/18 1521   09/03/18 0914  vancomycin variable dose per unstable renal function (pharmacist dosing)  Status:  Discontinued      Does not apply See admin instructions 09/03/18 0915 09/04/18 0310   09/02/18 1000  meropenem (MERREM) 1 g in sodium chloride 0.9 % 100 mL IVPB  Status:  Discontinued     1  g 200 mL/hr over 30 Minutes Intravenous Every 12 hours 09/02/18 0017 09/06/18 1246   09/02/18 0030  vancomycin (VANCOCIN) 1,750 mg in sodium chloride 0.9 %  500 mL IVPB  Status:  Discontinued     1,750 mg 250 mL/hr over 120 Minutes Intravenous Every 48 hours 09/02/18 0017 09/03/18 0915   09/01/18 2300  vancomycin (VANCOCIN) 2,000 mg in sodium chloride 0.9 % 500 mL IVPB  Status:  Discontinued     2,000 mg 250 mL/hr over 120 Minutes Intravenous  Once 09/01/18 2215 09/01/18 2353   09/01/18 2230  meropenem (MERREM) 2 g in sodium chloride 0.9 % 100 mL IVPB     2 g 200 mL/hr over 30 Minutes Intravenous  Once 09/01/18 2215 09/01/18 2346        Objective:   Vitals:   09/07/18 0940 09/07/18 0950 09/07/18 1000 09/07/18 1009  BP: (!) 162/64 (!) 139/117 (!) 164/118 (!) 144/68  Pulse: (!) 54 (!) 130 (!) 57 84  Resp: (!) 23 (!) 22 (!) 26 (!) 22  Temp:  98.6 F (37 C)    TempSrc:  Oral    SpO2: 97% 98% 98% 100%  Weight:      Height:        Wt Readings from Last 3 Encounters:  09/01/18 106.6 kg  12/07/17 124.7 kg  05/25/17 128.5 kg     Intake/Output Summary (Last 24 hours) at 09/07/2018 1404 Last data filed at 09/07/2018 1100 Gross per 24 hour  Intake 605.72 ml  Output 1225 ml  Net -619.28 ml     Physical Exam  Awake Alert, Oriented X 3, No new F.N deficits, Normal affect Symmetrical Chest wall movement, Good air movement bilaterally, CTAB RRR,No Gallops,Rubs or new Murmurs, No Parasternal Heave +ve B.Sounds, Abd Soft, No tenderness, No rebound - guarding or rigidity. erythema of the right foot, extending from the mid calf up to the right foot, with gangrenous right great toe   Data Review:    CBC Recent Labs  Lab 09/01/18 2157  09/03/18 0240 09/04/18 1016 09/05/18 0249 09/06/18 0337 09/07/18 0532  WBC 12.4*   < > 10.2 9.5 10.2 10.0 10.4  HGB 12.7*   < > 11.8* 12.8* 12.8* 12.6* 12.5*  HCT 38.5*   < > 34.3* 37.7* 38.7* 37.5* 37.4*  PLT 314   < > 253 289 309 317 317  MCV 87.9   < > 85.5 86.7 88.2 86.0 87.6  MCH 29.0   < > 29.4 29.4 29.2 28.9 29.3  MCHC 33.0   < > 34.4 34.0 33.1 33.6 33.4  RDW 14.1   < > 14.4 14.4 14.4 14.3  14.4  LYMPHSABS 1.1  --   --   --   --   --   --   MONOABS 0.8  --   --   --   --   --   --   EOSABS 0.0  --   --   --   --   --   --   BASOSABS 0.0  --   --   --   --   --   --    < > = values in this interval not displayed.    Chemistries  Recent Labs  Lab 09/01/18 2157 09/02/18 0351 09/03/18 0240 09/04/18 1016 09/05/18 0249 09/06/18 0337  NA 139 138 137 136 136 139  K 4.4 4.4 3.8 3.4* 3.9 3.3*  CL 102 104 103 102 104 106  CO2 25 25  23 25 22 24   GLUCOSE 147* 161* 137* 173* 151* 146*  BUN 34* 27* 34* 35* 34* 26*  CREATININE 2.95* 3.33* 3.75* 2.11* 1.79* 1.30*  CALCIUM 8.8* 8.4* 8.6* 8.6* 8.6* 8.5*  AST 58*  --   --   --   --   --   ALT 32  --   --   --   --   --   ALKPHOS 137*  --   --   --   --   --   BILITOT 1.5*  --   --   --   --   --    ------------------------------------------------------------------------------------------------------------------ No results for input(s): CHOL, HDL, LDLCALC, TRIG, CHOLHDL, LDLDIRECT in the last 72 hours.  Lab Results  Component Value Date   HGBA1C 8.1 (H) 01/31/2014   ------------------------------------------------------------------------------------------------------------------ No results for input(s): TSH, T4TOTAL, T3FREE, THYROIDAB in the last 72 hours.  Invalid input(s): FREET3 ------------------------------------------------------------------------------------------------------------------ No results for input(s): VITAMINB12, FOLATE, FERRITIN, TIBC, IRON, RETICCTPCT in the last 72 hours.  Coagulation profile Recent Labs  Lab 09/01/18 2157  INR 1.68    No results for input(s): DDIMER in the last 72 hours.  Cardiac Enzymes No results for input(s): CKMB, TROPONINI, MYOGLOBIN in the last 168 hours.  Invalid input(s): CK ------------------------------------------------------------------------------------------------------------------    Component Value Date/Time   BNP 66.7 11/29/2014 1410    Inpatient  Medications  Scheduled Meds: . aspirin EC  81 mg Oral Daily  . atorvastatin  40 mg Oral q1800  . feeding supplement (ENSURE ENLIVE)  237 mL Oral BID BM  . feeding supplement (PRO-STAT SUGAR FREE 64)  30 mL Oral BID  . insulin aspart  0-9 Units Subcutaneous Q4H  . metoprolol succinate  25 mg Oral Daily  . mometasone-formoterol  2 puff Inhalation BID  . multivitamin with minerals  1 tablet Oral Daily   Continuous Infusions: . heparin Stopped (09/07/18 0912)  . [START ON 09/08/2018] vancomycin    . vancomycin     PRN Meds:.acetaminophen **OR** acetaminophen, albuterol, fentaNYL (SUBLIMAZE) injection, hydrALAZINE, ipratropium, nitroGLYCERIN, ondansetron **OR** ondansetron (ZOFRAN) IV, oxyCODONE, sodium chloride flush  Micro Results Recent Results (from the past 240 hour(s))  Culture, blood (Routine X 2) w Reflex to ID Panel     Status: None   Collection Time: 09/02/18  2:24 PM  Result Value Ref Range Status   Specimen Description BLOOD BLOOD LEFT HAND  Final   Special Requests AEROBIC BOTTLE ONLY Blood Culture adequate volume  Final   Culture   Final    NO GROWTH 5 DAYS Performed at [facility], [place] [address] [place], [place] [address]    Report Status 09/07/2018 FINAL  Final  Culture, blood (Routine X 2) w Reflex to ID Panel     Status: None   Collection Time: 09/02/18  2:24 PM  Result Value Ref Range Status   Specimen Description BLOOD BLOOD LEFT HAND  Final   Special Requests AEROBIC BOTTLE ONLY Blood Culture adequate volume  Final   Culture   Final    NO GROWTH 5 DAYS Performed at [facility], [place] [address] [place], [place] [address]    Report Status 09/07/2018 FINAL  Final    Radiology Reports US Renal  Result Date: 09/03/2018 CLINICAL DATA:  Acute renal failure. EXAM: RENAL / URINARY TRACT ULTRASOUND COMPLETE COMPARISON:  Ultrasound of June 14, 2009. FINDINGS: Right Kidney: Renal measurements: 13.8 x 5.4 x 4.9 cm = volume: 192 mL . Echogenicity within  normal limits. No mass or hydronephrosis visualized. Left Kidney: Renal measurements: 12.3 x 5.8 x 5.0 cm = volume: 202 mL. Echogenicity within normal limits. No mass or hydronephrosis visualized. Bladder: Decompressed secondary to Foley catheter. IMPRESSION: No renal abnormality seen. Electronically Signed   By: Marijo Conception, M.D.   On: 09/03/2018 10:12   Vas [place] Burnard Bunting With/wo Tbi  Result Date: 09/02/2018 LOWER EXTREMITY DOPPLER STUDY Indications: Peripheral artery disease.  Performing Technologist: Carlos Levering RVT  Examination Guidelines: A complete evaluation includes at minimum, Doppler waveform signals and systolic blood pressure reading at the level of bilateral brachial, anterior tibial, and posterior tibial arteries, when vessel segments are accessible. Bilateral testing is considered an integral part of a complete examination. Photoelectric Plethysmograph (PPG) waveforms and toe systolic pressure readings are included as required and additional duplex testing as needed. Limited examinations for reoccurring indications may be performed as noted.  ABI Findings: +---------+------------------+-----+---------+--------+ Right    Rt Pressure (mmHg)IndexWaveform Comment  +---------+------------------+-----+---------+--------+ Brachial 101                    triphasic         +---------+------------------+-----+---------+--------+ PTA      152               1.50 biphasic          +---------+------------------+-----+---------+--------+ DP       104               1.03 biphasic          +---------+------------------+-----+---------+--------+ Great Toe                                Gangrene +---------+------------------+-----+---------+--------+ +---------+------------------+-----+---------+-------+ Left     Lt Pressure (mmHg)IndexWaveform Comment +---------+------------------+-----+---------+-------+ Brachial                                 IV       +---------+------------------+-----+---------+-------+ PTA      254               2.51 triphasic        +---------+------------------+-----+---------+-------+ DP       191               1.89 triphasic        +---------+------------------+-----+---------+-------+ Great Toe76                0.75                  +---------+------------------+-----+---------+-------+ +-------+-----------+-----------+------------+------------+ ABI/TBIToday's ABIToday's TBIPrevious ABIPrevious TBI +-------+-----------+-----------+------------+------------+ Right  1.5                                            +-------+-----------+-----------+------------+------------+ Left   2.51       0.75                                +-------+-----------+-----------+------------+------------+ Right ankle cuff was placed on proximal calf for patient comfort.  Summary: Right: Resting right ankle-brachial index indicates noncompressible right lower extremity arteries. Right ankle cuff was placed on proximal calf for patient comfort. Unable to obtain toe pressure due to gangrenous toe. Left: Resting left ankle-brachial index indicates  noncompressible left lower extremity arteries.The left toe-brachial index is normal.  *See table(s) above for measurements and observations.  Electronically signed by Curt Jews MD on 09/02/2018 at 5:34:17 PM.   Final      Phillips Climes M.D on 09/07/2018 at 2:04 PM  Between 7am to 7pm - Pager - [phone]  After 7pm go to [email or web address] - password [email or web address]  [facility] -  Office  [phone]

## 2018-09-07 NOTE — Care Management Note (Signed)
Case Management Note  Patient Details  Name: Jon [name] MRN: [id number] Date of Birth: 24-Apr-1951  Subjective/Objective:       MRSA bacteremia / ischemia and infection of his right great toe. From home with Daughter and son in law. PTA independent with ADL's. DME: cane, walker.      Melvern Sample (daughter)      [phone]      PCP: Rochel Brome  Action/Plan: Pt will need LT IV ABX, End date: 09/19/2018. [facility] to provide IV infusion therapy/ home  Angiography with possible intervention, scheduled for 09/08/2018.  PT evaluation post angiography ... NCM will continue for TOC needs .  Expected Discharge Date:                  Expected Discharge Plan:  [place]  In-House Referral:  NA  Discharge planning Services  CM Consult  Post Acute Care Choice:  Resumption of Svcs/PTA Provider([place]/ home oxygen) Choice offered to:  Patient  DME Arranged:  IV pump/equipment DME Agency:  [place]:  RN [facility] Agency:  [place], pending MD's order.   Status of Service:  In process, will continue to follow  If discussed at Long Length of Stay Meetings, dates discussed:    Additional Comments:  Sharin Mons, RN 09/07/2018, 3:53 PM

## 2018-09-07 NOTE — Progress Notes (Signed)
ANTICOAGULATION CONSULT NOTE  Pharmacy Consult:  Heparin Indication: atrial fibrillation  Allergies  Allergen Reactions  . Hydrocodone Itching  . Morphine Itching  . Duloxetine Hcl Other (See Comments)  . Codeine Itching  . Penicillins Itching and Rash    DID THE REACTION INVOLVE: Swelling of the face/tongue/throat, SOB, or low BP? No Sudden or severe rash/hives, skin peeling, or the inside of the mouth or nose? No Did it require medical treatment? No When did it last happen?unknown If all above answers are "NO", may proceed with cephalosporin use.    Patient Measurements: Height: 5\' 7"  (170.2 cm) Weight: 235 lb (106.6 kg) IBW/kg (Calculated) : 66.1  Heparin dosing weight = 90 kg  Vital Signs: Temp: 98.6 F (37 C) (01/07 0950) Temp Source: Oral (01/07 0950) BP: 144/68 (01/07 1009) Pulse Rate: 84 (01/07 1009)  Labs: Recent Labs    09/05/18 0249  09/06/18 0337 09/06/18 1408 09/07/18 0532  HGB 12.8*  --  12.6*  --  12.5*  HCT 38.7*  --  37.5*  --  37.4*  PLT 309  --  317  --  317  HEPARINUNFRC 0.22*   < > 0.18* 0.30 0.50  CREATININE 1.79*  --  1.30*  --   --    < > = values in this interval not displayed.    Estimated Creatinine Clearance: 62.4 mL/min (A) (by C-G formula based on SCr of 1.3 mg/dL (H)).  Assessment: 46 YOM continues on IV heparin for history of Afib while Xarelto is on hold.  Heparin level is therapeutic this AM. Plan for angiography tomorrow.   Goal of Therapy:  Heparin level 0.3-0.7 units/ml Monitor platelets by anticoagulation protocol: Yes   Plan:   Cont heparin drip to 1700 units/hr Daily heparin level F/u resuming xarelto post angogram  Onnie Boer, PharmD, BCIDP, AAHIVP, CPP Infectious Disease Pharmacist 09/07/2018 10:46 AM

## 2018-09-07 NOTE — Progress Notes (Signed)
[place] will provide Home infusion pharmacy for home IVABX as well as HHRN to support at home. [facility] is prepared for DC. I have spoken with [name], pts step daughter by phone. She and her husband will learn how to administer the IV ABX at home.   If patient discharges after hours, please call [phone].   Larry Sierras 09/07/2018, 4:56 PM

## 2018-09-07 NOTE — Progress Notes (Signed)
Patient ID: Jon [name], male   DOB: 1951-06-19, 68 y.o.   MRN: [id number]         [facility]  Date of Admission:  09/01/2018   Total days of antibiotics 9         ASSESSMENT: He has MRSA bacteremia complicating ischemia and infection of his right great toe.  He is improving.  He had no evidence of endocarditis by TEE this morning. His renal function continues to improve.  I will change daptomycin back to IV vancomycin to complete therapy.  He did not have any evidence of deep infection in his right great toe by CT scan at [facility].  I will stop meropenem now.  PLAN: 1. Discontinue daptomycin and meropenem and restart IV vancomycin 2. I will sign off now and arrange follow-up in my clinic  Diagnosis: Bacteremia  Culture Result: MRSA  Allergies  Allergen Reactions  . Hydrocodone Itching  . Morphine Itching  . Duloxetine Hcl Other (See Comments)  . Codeine Itching  . Penicillins Itching and Rash    DID THE REACTION INVOLVE: Swelling of the face/tongue/throat, SOB, or low BP? No Sudden or severe rash/hives, skin peeling, or the inside of the mouth or nose? No Did it require medical treatment? No When did it last happen?unknown If all above answers are "NO", may proceed with cephalosporin use.     OPAT Orders Discharge antibiotics: Per pharmacy protocol vancomycin Aim for Vancomycin trough 15-20 (unless otherwise indicated) Duration: 3 weeks total End Date: 09/19/2018  [facility] Care Per Protocol:  Labs weekly while on IV antibiotics: _x_ CBC with differential _x_ BMP __ CMP __ CRP __ ESR _x_ Vancomycin trough __ CK  _x_ Please pull PIC at completion of IV antibiotics __ Please leave PIC in place until doctor has seen patient or been notified  Fax weekly labs to [phone]  Clinic Follow Up Appt: With me in 3 to 4 weeks  Principal Problem:   MRSA bacteremia Active Problems:   Gangrene of toe of right foot ([place])   Hyperlipidemia   OSA on CPAP   Atrial fibrillation (HCC)   Chronic diastolic heart failure (HCC)   ARF (acute renal failure) (HCC)   Cellulitis of right foot   DM (diabetes mellitus), type 2 with renal complications (HCC)   Malnutrition of moderate degree   Scheduled Meds: . aspirin EC  81 mg Oral Daily  . atorvastatin  40 mg Oral q1800  . feeding supplement (ENSURE ENLIVE)  237 mL Oral BID BM  . feeding supplement (PRO-STAT SUGAR FREE 64)  30 mL Oral BID  . insulin aspart  0-9 Units Subcutaneous Q4H  . metoprolol succinate  25 mg Oral Daily  . mometasone-formoterol  2 puff Inhalation BID  . multivitamin with minerals  1 tablet Oral Daily   Continuous Infusions: . heparin Stopped (09/07/18 0912)  . [START ON 09/08/2018] vancomycin    . vancomycin     PRN Meds:.acetaminophen **OR** acetaminophen, albuterol, fentaNYL (SUBLIMAZE) injection, hydrALAZINE, ipratropium, nitroGLYCERIN, ondansetron **OR** ondansetron (ZOFRAN) IV, oxyCODONE, sodium chloride flush   SUBJECTIVE: He is feeling better.  He is having some pain in his right great toe.  He was able to get up and walk in the hall today.  Review of Systems: Review of Systems  Constitutional: Negative for chills, diaphoresis and fever.  Musculoskeletal: Positive for joint pain.    Allergies  Allergen Reactions  . Hydrocodone Itching  . Morphine Itching  . Duloxetine Hcl  Other (See Comments)  . Codeine Itching  . Penicillins Itching and Rash    DID THE REACTION INVOLVE: Swelling of the face/tongue/throat, SOB, or low BP? No Sudden or severe rash/hives, skin peeling, or the inside of the mouth or nose? No Did it require medical treatment? No When did it last happen?unknown If all above answers are "NO", may proceed with cephalosporin use.     OBJECTIVE: Vitals:   09/07/18 0940 09/07/18 0950 09/07/18 1000 09/07/18 1009  BP: (!) 162/64 (!) 139/117 (!) 164/118 (!) 144/68  Pulse: (!) 54 (!) 130 (!) 57 84  Resp:  (!) 23 (!) 22 (!) 26 (!) 22  Temp:  98.6 F (37 C)    TempSrc:  Oral    SpO2: 97% 98% 98% 100%  Weight:      Height:       Body mass index is 36.81 kg/m.  Physical Exam Constitutional:      Comments: He is worried about losing his right great toe and is anxious about his upcoming procedures.  Cardiovascular:     Rate and Rhythm: Normal rate and regular rhythm.     Heart sounds: No murmur.  Pulmonary:     Effort: Pulmonary effort is normal.  Musculoskeletal:     Comments: Dry gangrene of his right great toe persist.  He is having some bloody drainage.  There is no odor.     Lab Results Lab Results  Component Value Date   WBC 10.4 09/07/2018   HGB 12.5 (L) 09/07/2018   HCT 37.4 (L) 09/07/2018   MCV 87.6 09/07/2018   PLT 317 09/07/2018    Lab Results  Component Value Date   CREATININE 1.30 (H) 09/06/2018   BUN 26 (H) 09/06/2018   NA 139 09/06/2018   K 3.3 (L) 09/06/2018   CL 106 09/06/2018   CO2 24 09/06/2018    Lab Results  Component Value Date   ALT 32 09/01/2018   AST 58 (H) 09/01/2018   ALKPHOS 137 (H) 09/01/2018   BILITOT 1.5 (H) 09/01/2018     Microbiology: Recent Results (from the past 240 hour(s))  Culture, blood (Routine X 2) w Reflex to ID Panel     Status: None   Collection Time: 09/02/18  2:24 PM  Result Value Ref Range Status   Specimen Description BLOOD BLOOD LEFT HAND  Final   Special Requests AEROBIC BOTTLE ONLY Blood Culture adequate volume  Final   Culture   Final    NO GROWTH 5 DAYS Performed at [facility], [place] [address] [place], [place] [address]    Report Status 09/07/2018 FINAL  Final  Culture, blood (Routine X 2) w Reflex to ID Panel     Status: None   Collection Time: 09/02/18  2:24 PM  Result Value Ref Range Status   Specimen Description BLOOD BLOOD LEFT HAND  Final   Special Requests AEROBIC BOTTLE ONLY Blood Culture adequate volume  Final   Culture   Final    NO GROWTH 5 DAYS Performed at [facility],  [place] [address], [place],  [address]    Report Status 09/07/2018 FINAL  Final    Michel Bickers, MD [facility] for Infectious [place] [facility] [phone] pager   [phone] cell 09/07/2018, 12:56 PM

## 2018-09-07 NOTE — Progress Notes (Signed)
Peripherally Inserted Central Catheter/Midline Placement  The IV Nurse has discussed with the patient and/or persons authorized to consent for the patient, the purpose of this procedure and the potential benefits and risks involved with this procedure.  The benefits include less needle sticks, lab draws from the catheter, and the patient may be discharged home with the catheter. Risks include, but not limited to, infection, bleeding, blood clot (thrombus formation), and puncture of an artery; nerve damage and irregular heartbeat and possibility to perform a PICC exchange if needed/ordered by physician.  Alternatives to this procedure were also discussed.  Bard Power PICC patient education guide, fact sheet on infection prevention and patient information card has been provided to patient /or left at bedside.    PICC/Midline Placement Documentation  PICC Single Lumen 93/81/82 PICC Right Basilic 43 cm 0 cm (Active)  Indication for Insertion or Continuance of Line Home intravenous therapies (PICC only) 09/07/2018  4:30 PM  Exposed Catheter (cm) 0 cm 09/07/2018  4:30 PM  Site Assessment Clean;Dry;Intact 09/07/2018  4:30 PM  Line Status Flushed;Saline locked;Blood return noted 09/07/2018  4:30 PM  Dressing Type Transparent 09/07/2018  4:30 PM  Dressing Status Clean;Dry;Intact 09/07/2018  4:30 PM  Dressing Intervention New dressing 09/07/2018  4:30 PM  Dressing Change Due 09/14/18 09/07/2018  4:30 PM       Daysean Tinkham, Nicolette Bang 09/07/2018, 4:32 PM

## 2018-09-07 NOTE — Progress Notes (Signed)
   Patient currently off the floor for transesophageal echocardiogram.  Plan will be for angiography tomorrow with possible intervention.  N.p.o. past midnight.  Darshana Curnutt C. Donzetta Matters, MD [facility] [place] Office: [phone] Pager: [phone]

## 2018-09-07 NOTE — Progress Notes (Signed)
PHARMACY CONSULT NOTE FOR:  OUTPATIENT  PARENTERAL ANTIBIOTIC THERAPY (OPAT)  Indication: MRSA bacteremia Regimen: Vancomycin 1250 mg every 24 hours End date: 09/19/2018  IV antibiotic discharge orders are pended. To discharging provider:  please sign these orders via discharge navigator,  Select New Orders & click on the button choice - Manage This Unsigned Work.     Thank you for allowing pharmacy to be a part of this patient's care.  Jimmy Footman, PharmD, BCPS, BCIDP Infectious Diseases Clinical Pharmacist Phone: [phone] 09/07/2018, 1:21 PM

## 2018-09-07 NOTE — Interval H&P Note (Signed)
History and Physical Interval Note:  09/07/2018 9:24 AM  Jon [name]  has presented today for surgery, with the diagnosis of BACTEREMIA  The various methods of treatment have been discussed with the patient and family. After consideration of risks, benefits and other options for treatment, the patient has consented to  Procedure(s): TRANSESOPHAGEAL ECHOCARDIOGRAM (TEE) (N/A) as a surgical intervention .  The patient's history has been reviewed, patient examined, no change in status, stable for surgery.  I have reviewed the patient's chart and labs.  Questions were answered to the patient's satisfaction.     Mertie Moores

## 2018-09-08 ENCOUNTER — Encounter (HOSPITAL_COMMUNITY): Payer: Self-pay | Admitting: Cardiovascular Disease

## 2018-09-08 ENCOUNTER — Encounter (HOSPITAL_COMMUNITY)
Admission: AD | Disposition: A | Discharge status: Home Health | Payer: Self-pay | Source: Other Acute Inpatient Hospital | Attending: Internal Medicine

## 2018-09-08 DIAGNOSIS — E785 Hyperlipidemia, unspecified: Secondary | ICD-10-CM

## 2018-09-08 DIAGNOSIS — E440 Moderate protein-calorie malnutrition: Secondary | ICD-10-CM

## 2018-09-08 HISTORY — PX: ABDOMINAL AORTOGRAM W/LOWER EXTREMITY: CATH118223

## 2018-09-08 HISTORY — PX: PERIPHERAL VASCULAR INTERVENTION: CATH118257

## 2018-09-08 LAB — CBC
HCT: 38.6 % — ABNORMAL LOW (ref 39.0–52.0)
HEMOGLOBIN: 12.5 g/dL — AB (ref 13.0–17.0)
MCH: 28.2 pg (ref 26.0–34.0)
MCHC: 32.4 g/dL (ref 30.0–36.0)
MCV: 87.1 fL (ref 80.0–100.0)
Platelets: 329 10*3/uL (ref 150–400)
RBC: 4.43 MIL/uL (ref 4.22–5.81)
RDW: 14.5 % (ref 11.5–15.5)
WBC: 14.1 10*3/uL — ABNORMAL HIGH (ref 4.0–10.5)
nRBC: 0 % (ref 0.0–0.2)

## 2018-09-08 LAB — HEPARIN LEVEL (UNFRACTIONATED): Heparin Unfractionated: 0.17 IU/mL — ABNORMAL LOW (ref 0.30–0.70)

## 2018-09-08 LAB — GLUCOSE, CAPILLARY
GLUCOSE-CAPILLARY: 146 mg/dL — AB (ref 70–99)
GLUCOSE-CAPILLARY: 248 mg/dL — AB (ref 70–99)
Glucose-Capillary: 149 mg/dL — ABNORMAL HIGH (ref 70–99)
Glucose-Capillary: 171 mg/dL — ABNORMAL HIGH (ref 70–99)
Glucose-Capillary: 180 mg/dL — ABNORMAL HIGH (ref 70–99)
Glucose-Capillary: 186 mg/dL — ABNORMAL HIGH (ref 70–99)

## 2018-09-08 LAB — BASIC METABOLIC PANEL
Anion gap: 7 (ref 5–15)
BUN: 19 mg/dL (ref 8–23)
CO2: 23 mmol/L (ref 22–32)
Calcium: 8.9 mg/dL (ref 8.9–10.3)
Chloride: 109 mmol/L (ref 98–111)
Creatinine, Ser: 1.17 mg/dL (ref 0.61–1.24)
Glucose, Bld: 284 mg/dL — ABNORMAL HIGH (ref 70–99)
Potassium: 3.7 mmol/L (ref 3.5–5.1)
Sodium: 139 mmol/L (ref 135–145)

## 2018-09-08 LAB — POCT ACTIVATED CLOTTING TIME: Activated Clotting Time: 257 seconds

## 2018-09-08 SURGERY — ABDOMINAL AORTOGRAM W/LOWER EXTREMITY
Anesthesia: LOCAL | Laterality: Right

## 2018-09-08 MED ORDER — ACETAMINOPHEN 325 MG PO TABS
650.0000 mg | ORAL_TABLET | ORAL | Status: DC | PRN
  Administered 2018-09-10 – 2018-09-11 (×4): 650 mg via ORAL
  Filled 2018-09-08 (×4): qty 2

## 2018-09-08 MED ORDER — ONDANSETRON HCL 4 MG/2ML IJ SOLN: 4.0000 mg | Freq: Four times a day (QID) | INTRAMUSCULAR | Status: DC | PRN

## 2018-09-08 MED ORDER — IODIXANOL 320 MG/ML IV SOLN
INTRAVENOUS | Status: DC | PRN
  Administered 2018-09-08: 130 mL via INTRA_ARTERIAL

## 2018-09-08 MED ORDER — CLOPIDOGREL BISULFATE 300 MG PO TABS
ORAL_TABLET | ORAL | Status: AC
  Filled 2018-09-08: qty 1

## 2018-09-08 MED ORDER — SODIUM CHLORIDE 0.9% FLUSH
3.0000 mL | Freq: Two times a day (BID) | INTRAVENOUS | Status: DC
  Administered 2018-09-09: 3 mL via INTRAVENOUS

## 2018-09-08 MED ORDER — LIDOCAINE HCL (PF) 1 % IJ SOLN
INTRAMUSCULAR | Status: AC
  Filled 2018-09-08: qty 30

## 2018-09-08 MED ORDER — NITROGLYCERIN IN D5W 200-5 MCG/ML-% IV SOLN
INTRAVENOUS | Status: AC
  Filled 2018-09-08: qty 250

## 2018-09-08 MED ORDER — LABETALOL HCL 5 MG/ML IV SOLN
10.0000 mg | INTRAVENOUS | Status: DC | PRN
  Administered 2018-09-08: 10 mg via INTRAVENOUS
  Filled 2018-09-08: qty 4

## 2018-09-08 MED ORDER — HYDRALAZINE HCL 20 MG/ML IJ SOLN: 5.0000 mg | INTRAMUSCULAR | Status: DC | PRN

## 2018-09-08 MED ORDER — MUPIROCIN 2 % EX OINT
1.0000 "application " | TOPICAL_OINTMENT | Freq: Two times a day (BID) | CUTANEOUS | Status: DC
  Administered 2018-09-08 – 2018-09-11 (×6): 1 via NASAL
  Filled 2018-09-08: qty 22

## 2018-09-08 MED ORDER — SODIUM CHLORIDE 0.9 % IV SOLN: 250.0000 mL | INTRAVENOUS | Status: DC | PRN

## 2018-09-08 MED ORDER — SODIUM CHLORIDE 0.9 % WEIGHT BASED INFUSION
1.0000 mL/kg/h | INTRAVENOUS | Status: AC
  Administered 2018-09-08: 1 mL/kg/h via INTRAVENOUS

## 2018-09-08 MED ORDER — HEPARIN SODIUM (PORCINE) 1000 UNIT/ML IJ SOLN
INTRAMUSCULAR | Status: DC | PRN
  Administered 2018-09-08: 11000 [IU] via INTRAVENOUS
  Administered 2018-09-08: 2000 [IU] via INTRAVENOUS

## 2018-09-08 MED ORDER — SODIUM CHLORIDE 0.9% FLUSH: 3.0000 mL | INTRAVENOUS | Status: DC | PRN

## 2018-09-08 MED ORDER — AMLODIPINE BESYLATE 5 MG PO TABS
5.0000 mg | ORAL_TABLET | Freq: Every day | ORAL | Status: DC
  Administered 2018-09-08 – 2018-09-11 (×4): 5 mg via ORAL
  Filled 2018-09-08 (×3): qty 1

## 2018-09-08 MED ORDER — CLOPIDOGREL BISULFATE 75 MG PO TABS
300.0000 mg | ORAL_TABLET | Freq: Once | ORAL | Status: AC
  Administered 2018-09-08: 300 mg via ORAL

## 2018-09-08 MED ORDER — HEPARIN SODIUM (PORCINE) 1000 UNIT/ML IJ SOLN
INTRAMUSCULAR | Status: AC
  Filled 2018-09-08: qty 1

## 2018-09-08 MED ORDER — HEPARIN (PORCINE) IN NACL 1000-0.9 UT/500ML-% IV SOLN
INTRAVENOUS | Status: DC | PRN
  Administered 2018-09-08 (×2): 500 mL

## 2018-09-08 MED ORDER — MIDAZOLAM HCL 2 MG/2ML IJ SOLN
INTRAMUSCULAR | Status: DC | PRN
  Administered 2018-09-08: 1 mg via INTRAVENOUS

## 2018-09-08 MED ORDER — MIDAZOLAM HCL 2 MG/2ML IJ SOLN
INTRAMUSCULAR | Status: AC
  Filled 2018-09-08: qty 2

## 2018-09-08 MED ORDER — FENTANYL CITRATE (PF) 100 MCG/2ML IJ SOLN
INTRAMUSCULAR | Status: DC | PRN
  Administered 2018-09-08: 25 ug via INTRAVENOUS

## 2018-09-08 MED ORDER — INSULIN ASPART 100 UNIT/ML ~~LOC~~ SOLN
0.0000 [IU] | Freq: Three times a day (TID) | SUBCUTANEOUS | Status: DC
  Administered 2018-09-09 (×2): 1 [IU] via SUBCUTANEOUS
  Administered 2018-09-09: 2 [IU] via SUBCUTANEOUS
  Administered 2018-09-10: 1 [IU] via SUBCUTANEOUS
  Administered 2018-09-10: 2 [IU] via SUBCUTANEOUS
  Administered 2018-09-11: 1 [IU] via SUBCUTANEOUS

## 2018-09-08 MED ORDER — CLOPIDOGREL BISULFATE 75 MG PO TABS
75.0000 mg | ORAL_TABLET | Freq: Every day | ORAL | Status: DC
  Administered 2018-09-09 – 2018-09-11 (×4): 75 mg via ORAL
  Filled 2018-09-08 (×3): qty 1

## 2018-09-08 MED ORDER — HYDRALAZINE HCL 20 MG/ML IJ SOLN
10.0000 mg | Freq: Once | INTRAMUSCULAR | Status: AC
  Administered 2018-09-08: 10 mg via INTRAVENOUS
  Filled 2018-09-08: qty 1

## 2018-09-08 MED ORDER — NITROGLYCERIN 1 MG/10 ML FOR IR/CATH LAB
INTRA_ARTERIAL | Status: DC | PRN
  Administered 2018-09-08: 300 ug via INTRA_ARTERIAL
  Administered 2018-09-08 (×3): 200 ug via INTRA_ARTERIAL
  Administered 2018-09-08: 300 ug via INTRA_ARTERIAL

## 2018-09-08 MED ORDER — FENTANYL CITRATE (PF) 100 MCG/2ML IJ SOLN
INTRAMUSCULAR | Status: AC
  Filled 2018-09-08: qty 2

## 2018-09-08 MED ORDER — HEPARIN (PORCINE) 25000 UT/250ML-% IV SOLN
1950.0000 [IU]/h | INTRAVENOUS | Status: DC
  Administered 2018-09-09: 1800 [IU]/h via INTRAVENOUS
  Filled 2018-09-08: qty 250

## 2018-09-08 MED ORDER — VERAPAMIL HCL 2.5 MG/ML IV SOLN
INTRAVENOUS | Status: AC
  Filled 2018-09-08: qty 2

## 2018-09-08 MED ORDER — HEPARIN (PORCINE) IN NACL 1000-0.9 UT/500ML-% IV SOLN
INTRAVENOUS | Status: AC
  Filled 2018-09-08: qty 1000

## 2018-09-08 MED ORDER — VIPERSLIDE LUBRICANT OPTIME
TOPICAL | Status: DC | PRN
  Administered 2018-09-08: 16:00:00 via SURGICAL_CAVITY

## 2018-09-08 SURGICAL SUPPLY — 31 items
BALLN COYOTE ES OTW 3X40X145 (BALLOONS) ×3
BALLN STERLING OTW 3X40X150 (BALLOONS) ×3
BALLOON COYOTE ES OTW 3X40X145 (BALLOONS) IMPLANT
BALLOON STERLING OTW 3X40X150 (BALLOONS) IMPLANT
CATH OMNI FLUSH 5F 65CM (CATHETERS) ×1 IMPLANT
CATH QUICKCROSS .035X135CM (MICROCATHETER) ×1 IMPLANT
CATH QUICKCROSS ANG SELECT (CATHETERS) ×1 IMPLANT
CATH SOFT-VU 4F 65 STRAIGHT (CATHETERS) IMPLANT
CATH SOFT-VU STRAIGHT 4F 65CM (CATHETERS) ×3
CLOSURE MYNX CONTROL 6F/7F (Vascular Products) ×1 IMPLANT
COVER DOME SNAP 22 D (MISCELLANEOUS) ×1 IMPLANT
CROWN STEALTH MICRO-30 1.25MM (CATHETERS) ×1 IMPLANT
FILTER CO2 0.2 MICRON (VASCULAR PRODUCTS) ×1 IMPLANT
GLIDEWIRE ADV .035X260CM (WIRE) ×1 IMPLANT
KIT ENCORE 26 ADVANTAGE (KITS) ×1 IMPLANT
KIT MICROPUNCTURE NIT STIFF (SHEATH) ×1 IMPLANT
KIT PV (KITS) ×3 IMPLANT
LUBRICANT VIPERSLIDE CORONARY (MISCELLANEOUS) ×1 IMPLANT
RESERVOIR CO2 (VASCULAR PRODUCTS) ×1 IMPLANT
SET FLUSH CO2 (MISCELLANEOUS) ×1 IMPLANT
SHEATH FLEX ANSEL ANG 6F 45CM (SHEATH) ×1 IMPLANT
SHEATH PINNACLE 5F 10CM (SHEATH) ×1 IMPLANT
SHEATH PINNACLE 6F 10CM (SHEATH) ×1 IMPLANT
SHEATH PROBE COVER 6X72 (BAG) ×1 IMPLANT
SYR MEDRAD MARK V 150ML (SYRINGE) ×1 IMPLANT
TRANSDUCER W/STOPCOCK (MISCELLANEOUS) ×3 IMPLANT
TRAY PV CATH (CUSTOM PROCEDURE TRAY) ×3 IMPLANT
WIRE BENTSON .035X145CM (WIRE) ×1 IMPLANT
WIRE G V18X300CM (WIRE) ×2 IMPLANT
WIRE ROSEN-J .035X180CM (WIRE) ×1 IMPLANT
WIRE VIPER ADVANCE .017X335CM (WIRE) ×1 IMPLANT

## 2018-09-08 NOTE — Progress Notes (Signed)
[facility]   Subjective  - Cr improved to 1.17.   Objective (!) 149/92 76 99.1 F (37.3 C) (Oral) 18 98%  Intake/Output Summary (Last 24 hours) at 09/08/2018 1504 Last data filed at 09/08/2018 1422 Gross per 24 hour  Intake 912.35 ml  Output 625 ml  Net 287.35 ml    Bilateral femoral pulses are palpable Right great toe stable dry gangrene Toe tip ulceration of the left great toe  Laboratory Lab Results: Recent Labs    09/07/18 0532 09/08/18 0356  WBC 10.4 14.1*  HGB 12.5* 12.5*  HCT 37.4* 38.6*  PLT 317 329   BMET Recent Labs    09/06/18 0337 09/08/18 1228  NA 139 139  K 3.3* 3.7  CL 106 109  CO2 24 23  GLUCOSE 146* 284*  BUN 26* 19  CREATININE 1.30* 1.17  CALCIUM 8.5* 8.9    COAG Lab Results  Component Value Date   INR 1.68 09/01/2018   INR 1.04 01/30/2014   INR 0.98 01/18/2014   No results found for: PTT  Assessment/Planning:  Aortogram/bilateral lower extremity arteriogram today.  Will try to use CO2 to limit contrast.  Marty Heck 09/08/2018 3:04 PM --

## 2018-09-08 NOTE — Progress Notes (Signed)
ANTICOAGULATION CONSULT NOTE  Pharmacy Consult:  Heparin Indication: atrial fibrillation  Allergies  Allergen Reactions  . Hydrocodone Itching  . Morphine Itching  . Duloxetine Hcl Other (See Comments)  . Codeine Itching  . Penicillins Itching and Rash    DID THE REACTION INVOLVE: Swelling of the face/tongue/throat, SOB, or low BP? No Sudden or severe rash/hives, skin peeling, or the inside of the mouth or nose? No Did it require medical treatment? No When did it last happen?unknown If all above answers are "NO", may proceed with cephalosporin use.    Patient Measurements: Height: 5\' 7"  (170.2 cm) Weight: 235 lb (106.6 kg) IBW/kg (Calculated) : 66.1  Heparin dosing weight = 90 kg  Vital Signs: Temp: 98.3 F (36.8 C) (01/08 1825) Temp Source: Oral (01/08 1825) BP: 187/86 (01/08 1825) Pulse Rate: 80 (01/08 1825)  Labs: Recent Labs    09/06/18 0337 09/06/18 1408 09/07/18 0532 09/08/18 0356 09/08/18 1228  HGB 12.6*  --  12.5* 12.5*  --   HCT 37.5*  --  37.4* 38.6*  --   PLT 317  --  317 329  --   HEPARINUNFRC 0.18* 0.30 0.50 0.17*  --   CREATININE 1.30*  --   --   --  1.17    Estimated Creatinine Clearance: 69.4 mL/min (by C-G formula based on SCr of 1.17 mg/dL).  Assessment: 51 YOM continues on IV heparin for history of Afib while Xarelto is on hold.  Underwent bilateral LE arteriogram with runoff on 1/8- heparin was stopped prior to procedure. Hgb 12.5, plt 329. Will restart heparin infusion 8 hours after procedure end (1/8@1700 ) per vascular. No s/sx of bleeding documented.   Goal of Therapy:  Heparin level 0.3-0.7 units/ml Monitor platelets by anticoagulation protocol: Yes   Plan:  Restart heparin infusion at 1800 units/hr on 1/9@0100  Order heparin level in 6 hours Monitor daily HL, CBC, and for s/sx of bleeding.  Antonietta Jewel, PharmD, [place] Clinical Pharmacist  Pager: [phone] Phone: [phone] 09/08/2018 6:48 PM

## 2018-09-08 NOTE — Plan of Care (Signed)

## 2018-09-08 NOTE — Progress Notes (Signed)
Nutrition Follow-up  DOCUMENTATION CODES:   Non-severe (moderate) malnutrition in context of social or environmental circumstances  INTERVENTION:   - Once pt back on diet, continue Pro-stat 30 ml BID, each supplement provides 100 kcals and 15 grams protein  - Once pt back on diet, continue Ensure Enlive po BID, each supplement provides 350 kcal and 20 grams of protein  - Continue MVI with minerals daily  NUTRITION DIAGNOSIS:   Moderate Malnutrition related to social / environmental circumstances as evidenced by energy intake < or equal to 75% for > or equal to 1 month, percent weight loss, mild fat depletion, mild muscle depletion.  Ongoing  GOAL:   Patient will meet greater than or equal to 90% of their needs  Progressing  MONITOR:   PO intake, Supplement acceptance, Weight trends, Labs  REASON FOR ASSESSMENT:   Malnutrition Screening Tool    ASSESSMENT:   68 yo male, admitted with MRSA, R great toe gangrene. PMH significant for T2DM insulin-dependent, atrial fibrillation, HTN, COPD, hypothyroidism, HLD, former smoker. Home meds include Lipitor 40 mg daily, calcium carbonate-vitamin D BID, ferrous sulfate 325 mg daily, insulin via pump, MVI, Protonix, KCl.  1/7 - s/p TEE with no evidence of endocarditis  Noted plan for angiogram today. Nephrology has signed off.  Spoke with pt at bedside. Pt states he has not yet had anything to eat today because he is waiting on a procedure.  Pt confirms that he is drinking ~2 Ensure supplements daily and is taking the Pro-stat even though he does not like it.  Pt reports he ate about 50% of his dinner last night (chicken, potatoes per pt). RD encouraged PO intake with a focus on protein-rich foods to aid in wound healing. Pt expresses understanding.  Medications reviewed and include: Ensure Enlive BID, Pro-stat BID, SSI q 4 hours, MVI with minerals, IV heparin, IV antibiotics  Labs reviewed: potassium 3.3 (L) on 1/6 CBG's: 146,  149, 171, 191, 162, 225 x 24 hours  UOP: 1350 ml x 24 hours  Diet Order:   Diet Order            Diet NPO time specified  Diet effective midnight              EDUCATION NEEDS:   No education needs have been identified at this time  Skin:  Skin Assessment: Skin Integrity Issues: Other: R great toe - gangrenous  Last BM:  1/6  Height:   Ht Readings from Last 1 Encounters:  09/01/18 5\' 7"  (1.702 m)    Weight:   Wt Readings from Last 1 Encounters:  09/01/18 106.6 kg    Ideal Body Weight:  67.3 kg  BMI:  Body mass index is 36.81 kg/m.  Estimated Nutritional Needs:   Kcal:  2153 calories daily (MSJ x 1.2)  Protein:  117-139 gm daily (1.1-1.3 g/kg ABW)  Fluid:  >/= 2.1 L daily or per MD discretion    Gaynell Face, MS, RD, LDN Inpatient Clinical Dietitian Pager: [phone] Weekend/After Hours: [phone]

## 2018-09-08 NOTE — Progress Notes (Signed)
ANTICOAGULATION CONSULT NOTE  Pharmacy Consult:  Heparin Indication: atrial fibrillation  Allergies  Allergen Reactions  . Hydrocodone Itching  . Morphine Itching  . Duloxetine Hcl Other (See Comments)  . Codeine Itching  . Penicillins Itching and Rash    DID THE REACTION INVOLVE: Swelling of the face/tongue/throat, SOB, or low BP? No Sudden or severe rash/hives, skin peeling, or the inside of the mouth or nose? No Did it require medical treatment? No When did it last happen?unknown If all above answers are "NO", may proceed with cephalosporin use.    Patient Measurements: Height: 5\' 7"  (170.2 cm) Weight: 235 lb (106.6 kg) IBW/kg (Calculated) : 66.1  Heparin dosing weight = 90 kg  Vital Signs: Temp: 99.1 F (37.3 C) (01/08 0437) Temp Source: Oral (01/08 0437) BP: 190/83 (01/08 0652) Pulse Rate: 74 (01/08 0814)  Labs: Recent Labs    09/06/18 0337 09/06/18 1408 09/07/18 0532 09/08/18 0356  HGB 12.6*  --  12.5* 12.5*  HCT 37.5*  --  37.4* 38.6*  PLT 317  --  317 329  HEPARINUNFRC 0.18* 0.30 0.50 0.17*  CREATININE 1.30*  --   --   --     Estimated Creatinine Clearance: 62.4 mL/min (A) (by C-G formula based on SCr of 1.3 mg/dL (H)).  Assessment: 42 YOM continues on IV heparin for history of Afib while Xarelto is on hold.  Pt is getting angiogram today with possible intervention. D/w Dr. Donzetta Matters, ok to hold heparin.   Goal of Therapy:  Heparin level 0.3-0.7 units/ml Monitor platelets by anticoagulation protocol: Yes   Plan:   Hold heparin for procedure F/u [facility] post procedure  Onnie Boer, PharmD, BCIDP, AAHIVP, CPP Infectious Disease Pharmacist 09/08/2018 10:37 AM

## 2018-09-08 NOTE — Progress Notes (Signed)
PROGRESS NOTE        PATIENT DETAILS Name: Jon [name] Age: 68 y.o. Sex: male Date of Birth: 09/26/1950 Admit Date: 09/01/2018 Admitting Physician Antonieta Pert, MD PCP:Cox, Elnita Maxwell, MD  Brief Narrative: Patient is a 68 y.o. male with history of DM-2, A. fib on Xarelto, hypertension, OSA on CPAP-transferred from [facility] for right great toe gangrene and MRSA bacteremia.  See below for further details  Subjective: Lying comfortably in bed-denies any chest pain or shortness of breath.  Assessment/Plan: MRSA bacteremia: Clinically improved-no evidence of vegetations on TEE.  Recommendations from ID are to continue vancomycin with stop date of 1/19.  PICC line placed on 1/7.  Repeat blood cultures on 1/2- so far.  Right gangrenous great toe: Being followed by vascular surgery-for angiogram on 1/8.  Post angiogram, will discuss with vascular surgery if this patient needs amputation of the toe.  AKI: Likely hemodynamically mediated-thought to have ATN in the setting of volume depletion, dehydration.  Renal failure significantly improved.  DM-2: CBG stable-continue SSI  Dyslipidemia: Continue statin  Chronic atrial fibrillation: Remains in A. fib-rate controlled with metoprolol.  Heparin held-as patient is scheduled for angiogram later today.  Resume Xarelto when closer to discharge  Hypertension: Uncontrolled-add amlodipine-continue metoprolol.    COPD: Stable-continue bronchodilators  OSA: Continue CPAP  Moderate malnutrition: Continue supplements  DVT Prophylaxis: Full dose anticoagulation with Heparin  Code Status: Full code   Family Communication: None at bedside  Disposition Plan: Remain inpatient  Antimicrobial agents: Anti-infectives (From admission, onward)   Start     Dose/Rate Route Frequency Ordered Stop   09/08/18 1600  vancomycin (VANCOCIN) 1,250 mg in sodium chloride 0.9 % 250 mL IVPB     1,250 mg 166.7 mL/hr over 90  Minutes Intravenous Every 24 hours 09/07/18 1244     09/07/18 1600  vancomycin (VANCOCIN) 2,000 mg in sodium chloride 0.9 % 500 mL IVPB     2,000 mg 250 mL/hr over 120 Minutes Intravenous  Once 09/07/18 1244 09/07/18 1952   09/06/18 1700  meropenem (MERREM) 1 g in sodium chloride 0.9 % 100 mL IVPB  Status:  Discontinued     1 g 200 mL/hr over 30 Minutes Intravenous Every 8 hours 09/06/18 1246 09/07/18 1233   09/05/18 2000  DAPTOmycin (CUBICIN) 650 mg in sodium chloride 0.9 % IVPB  Status:  Discontinued     650 mg 226 mL/hr over 30 Minutes Intravenous Every 48 hours 09/04/18 0310 09/04/18 1531   09/05/18 2000  DAPTOmycin (CUBICIN) 650 mg in sodium chloride 0.9 % IVPB  Status:  Discontinued     650 mg 226 mL/hr over 30 Minutes Intravenous Daily 09/04/18 1531 09/07/18 1233   09/04/18 0800  DAPTOmycin (CUBICIN) 650 mg in sodium chloride 0.9 % IVPB     650 mg 226 mL/hr over 30 Minutes Intravenous  Once 09/04/18 0310 09/04/18 1521   09/03/18 0914  vancomycin variable dose per unstable renal function (pharmacist dosing)  Status:  Discontinued      Does not apply See admin instructions 09/03/18 0915 09/04/18 0310   09/02/18 1000  meropenem (MERREM) 1 g in sodium chloride 0.9 % 100 mL IVPB  Status:  Discontinued     1 g 200 mL/hr over 30 Minutes Intravenous Every 12 hours 09/02/18 0017 09/06/18 1246   09/02/18 0030  vancomycin (VANCOCIN) 1,750 mg in sodium  chloride 0.9 % 500 mL IVPB  Status:  Discontinued     1,750 mg 250 mL/hr over 120 Minutes Intravenous Every 48 hours 09/02/18 0017 09/03/18 0915   09/01/18 2300  vancomycin (VANCOCIN) 2,000 mg in sodium chloride 0.9 % 500 mL IVPB  Status:  Discontinued     2,000 mg 250 mL/hr over 120 Minutes Intravenous  Once 09/01/18 2215 09/01/18 2353   09/01/18 2230  meropenem (MERREM) 2 g in sodium chloride 0.9 % 100 mL IVPB     2 g 200 mL/hr over 30 Minutes Intravenous  Once 09/01/18 2215 09/01/18 2346      Procedures: None  CONSULTS:  ID and  vascular surgery  Time spent: 25- minutes-Greater than 50% of this time was spent in counseling, explanation of diagnosis, planning of further management, and coordination of care.  MEDICATIONS: Scheduled Meds: . aspirin EC  81 mg Oral Daily  . atorvastatin  40 mg Oral q1800  . feeding supplement (ENSURE ENLIVE)  237 mL Oral BID BM  . feeding supplement (PRO-STAT SUGAR FREE 64)  30 mL Oral BID  . insulin aspart  0-9 Units Subcutaneous Q4H  . metoprolol succinate  25 mg Oral Daily  . mometasone-formoterol  2 puff Inhalation BID  . multivitamin with minerals  1 tablet Oral Daily  . sodium chloride flush  10-40 mL Intracatheter Q12H   Continuous Infusions: . vancomycin     PRN Meds:.acetaminophen **OR** acetaminophen, albuterol, fentaNYL (SUBLIMAZE) injection, hydrALAZINE, ipratropium, nitroGLYCERIN, ondansetron **OR** ondansetron (ZOFRAN) IV, oxyCODONE, sodium chloride flush, sodium chloride flush   PHYSICAL EXAM: Vital signs: Vitals:   09/08/18 0437 09/08/18 0458 09/08/18 0652 09/08/18 0814  BP: (!) 185/80  (!) 190/83   Pulse: 68   74  Resp: 20   18  Temp: 99.1 F (37.3 C)     TempSrc: Oral     SpO2: 98% 97%  98%  Weight:      Height:       Filed Weights   09/01/18 1944  Weight: 106.6 kg   Body mass index is 36.81 kg/m.   General appearance :Awake, alert, not in any distress. HEENT: Atraumatic and Normocephalic Neck: supple Resp:Good air entry bilaterally, no added sounds  CVS: S1 S2 regular, no murmurs.  GI: Bowel sounds present, Non tender and not distended with no gaurding, rigidity or rebound. Extremities: B/L Lower Ext shows no edema, both legs are warm to touch.  Right great toe appears gangrenous. Neurology:  speech clear,Non focal, sensation is grossly intact. Musculoskeletal:No digital cyanosis Skin:No Rash, warm and dry Wounds:N/A  I have personally reviewed following labs and imaging studies  LABORATORY DATA: CBC: Recent Labs  Lab 09/01/18 2157   09/04/18 1016 09/05/18 0249 09/06/18 0337 09/07/18 0532 09/08/18 0356  WBC 12.4*   < > 9.5 10.2 10.0 10.4 14.1*  NEUTROABS 10.3*  --   --   --   --   --   --   HGB 12.7*   < > 12.8* 12.8* 12.6* 12.5* 12.5*  HCT 38.5*   < > 37.7* 38.7* 37.5* 37.4* 38.6*  MCV 87.9   < > 86.7 88.2 86.0 87.6 87.1  PLT 314   < > 289 309 317 317 329   < > = values in this interval not displayed.    Basic Metabolic Panel: Recent Labs  Lab 09/02/18 0351 09/03/18 0240 09/04/18 1016 09/05/18 0249 09/06/18 0337  NA 138 137 136 136 139  K 4.4 3.8 3.4* 3.9 3.3*  CL  104 103 102 104 106  CO2 25 23 25 22 24   GLUCOSE 161* 137* 173* 151* 146*  BUN 27* 34* 35* 34* 26*  CREATININE 3.33* 3.75* 2.11* 1.79* 1.30*  CALCIUM 8.4* 8.6* 8.6* 8.6* 8.5*    GFR: Estimated Creatinine Clearance: 62.4 mL/min (A) (by C-G formula based on SCr of 1.3 mg/dL (H)).  Liver Function Tests: Recent Labs  Lab 09/01/18 2157  AST 58*  ALT 32  ALKPHOS 137*  BILITOT 1.5*  PROT 6.6  ALBUMIN 2.5*   No results for input(s): LIPASE, AMYLASE in the last 168 hours. No results for input(s): AMMONIA in the last 168 hours.  Coagulation Profile: Recent Labs  Lab 09/01/18 2157  INR 1.68    Cardiac Enzymes: No results for input(s): CKTOTAL, CKMB, CKMBINDEX, TROPONINI in the last 168 hours.  BNP (last 3 results) No results for input(s): PROBNP in the last 8760 hours.  HbA1C: No results for input(s): HGBA1C in the last 72 hours.  CBG: Recent Labs  Lab 09/07/18 2006 09/08/18 0008 09/08/18 0435 09/08/18 0818 09/08/18 1208  GLUCAP 191* 171* 149* 146* 248*    Lipid Profile: No results for input(s): CHOL, HDL, LDLCALC, TRIG, CHOLHDL, LDLDIRECT in the last 72 hours.  Thyroid Function Tests: No results for input(s): TSH, T4TOTAL, FREET4, T3FREE, THYROIDAB in the last 72 hours.  Anemia Panel: No results for input(s): VITAMINB12, FOLATE, FERRITIN, TIBC, IRON, RETICCTPCT in the last 72 hours.  Urine analysis:      Component Value Date/Time   COLORURINE YELLOW 09/03/2018 1555   APPEARANCEUR HAZY (A) 09/03/2018 1555   LABSPEC 1.011 09/03/2018 1555   PHURINE 5.0 09/03/2018 1555   GLUCOSEU NEGATIVE 09/03/2018 1555   HGBUR SMALL (A) 09/03/2018 1555   BILIRUBINUR NEGATIVE 09/03/2018 1555   KETONESUR NEGATIVE 09/03/2018 1555   PROTEINUR NEGATIVE 09/03/2018 1555   UROBILINOGEN 0.2 01/30/2014 2134   NITRITE NEGATIVE 09/03/2018 1555   LEUKOCYTESUR NEGATIVE 09/03/2018 1555    Sepsis Labs: Lactic Acid, Venous    Component Value Date/Time   LATICACIDVEN 1.7 01/18/2014 2150    MICROBIOLOGY: Recent Results (from the past 240 hour(s))  Culture, blood (Routine X 2) w Reflex to ID Panel     Status: None   Collection Time: 09/02/18  2:24 PM  Result Value Ref Range Status   Specimen Description BLOOD BLOOD LEFT HAND  Final   Special Requests AEROBIC BOTTLE ONLY Blood Culture adequate volume  Final   Culture   Final    NO GROWTH 5 DAYS Performed at [facility], [place] [address], [place], [place] [address]    Report Status 09/07/2018 FINAL  Final  Culture, blood (Routine X 2) w Reflex to ID Panel     Status: None   Collection Time: 09/02/18  2:24 PM  Result Value Ref Range Status   Specimen Description BLOOD BLOOD LEFT HAND  Final   Special Requests AEROBIC BOTTLE ONLY Blood Culture adequate volume  Final   Culture   Final    NO GROWTH 5 DAYS Performed at [facility], [place] [address] [place], [place] [address]    Report Status 09/07/2018 FINAL  Final    RADIOLOGY STUDIES/RESULTS: US Renal  Result Date: 09/03/2018 CLINICAL DATA:  Acute renal failure. EXAM: RENAL / URINARY TRACT ULTRASOUND COMPLETE COMPARISON:  Ultrasound of June 14, 2009. FINDINGS: Right Kidney: Renal measurements: 13.8 x 5.4 x 4.9 cm = volume: 192 mL . Echogenicity within normal limits. No mass or hydronephrosis visualized. Left Kidney: Renal  measurements: 12.3 x 5.8 x 5.0 cm = volume: 202 mL. Echogenicity within  normal limits. No mass or hydronephrosis visualized. Bladder: Decompressed secondary to Foley catheter. IMPRESSION: No renal abnormality seen. Electronically Signed   By: Marijo Conception, M.D.   On: 09/03/2018 10:12   Dg Chest Port 1 View  Result Date: 09/07/2018 CLINICAL DATA:  67 y/o  M; PICC line placement. EXAM: PORTABLE CHEST 1 VIEW COMPARISON:  09/07/2018 chest radiograph FINDINGS: Right PICC line tip projects over mid SVC. Stable cardiomegaly given projection and technique. Aortic calcific atherosclerosis. Stable ill-defined left basilar opacity. Pulmonary vascular congestion. Small left effusion. No pneumothorax. No acute osseous abnormality identified. IMPRESSION: 1. Right PICC line tip projects over mid SVC. 2. Stable cardiomegaly, pulmonary vascular congestion, left basilar opacity, and small left effusion. Electronically Signed   By: Kristine Garbe M.D.   On: 09/07/2018 17:16   Dg Chest Port 1 View  Result Date: 09/07/2018 CLINICAL DATA:  PICC line placement EXAM: PORTABLE CHEST 1 VIEW COMPARISON:  Chest x-ray of 08/03/2017 FINDINGS: The tip of the right PICC line crosses the midline into the left innominate vein. This PICC line could be pulled back by approximately 3.5 cm. No pneumothorax is seen. There is opacity medially at the left lung base which may represent pneumonia. Also there may be a small left pleural effusion present. The right lung appears clear. Cardiomegaly is stable. There are degenerative changes in the mid to lower thoracic spine. IMPRESSION: 1. Right-sided PICC line tip crosses the midline into the left innominate vein. 2. Parenchymal opacity medially at the left lung base suspicious for pneumonia and possible small left pleural effusion. Electronically Signed   By: Ivar Drape M.D.   On: 09/07/2018 16:38   Vas [place] Burnard Bunting With/wo Tbi  Result Date: 09/02/2018 LOWER EXTREMITY DOPPLER STUDY Indications: Peripheral artery disease.  Performing Technologist: Carlos Levering  RVT  Examination Guidelines: A complete evaluation includes at minimum, Doppler waveform signals and systolic blood pressure reading at the level of bilateral brachial, anterior tibial, and posterior tibial arteries, when vessel segments are accessible. Bilateral testing is considered an integral part of a complete examination. Photoelectric Plethysmograph (PPG) waveforms and toe systolic pressure readings are included as required and additional duplex testing as needed. Limited examinations for reoccurring indications may be performed as noted.  ABI Findings: +---------+------------------+-----+---------+--------+ Right    Rt Pressure (mmHg)IndexWaveform Comment  +---------+------------------+-----+---------+--------+ Brachial 101                    triphasic         +---------+------------------+-----+---------+--------+ PTA      152               1.50 biphasic          +---------+------------------+-----+---------+--------+ DP       104               1.03 biphasic          +---------+------------------+-----+---------+--------+ Great Toe                                Gangrene +---------+------------------+-----+---------+--------+ +---------+------------------+-----+---------+-------+ Left     Lt Pressure (mmHg)IndexWaveform Comment +---------+------------------+-----+---------+-------+ Brachial                                 IV      +---------+------------------+-----+---------+-------+ PTA  254               2.51 triphasic        +---------+------------------+-----+---------+-------+ DP       191               1.89 triphasic        +---------+------------------+-----+---------+-------+ Great Toe76                0.75                  +---------+------------------+-----+---------+-------+ +-------+-----------+-----------+------------+------------+ ABI/TBIToday's ABIToday's TBIPrevious ABIPrevious TBI  +-------+-----------+-----------+------------+------------+ Right  1.5                                            +-------+-----------+-----------+------------+------------+ Left   2.51       0.75                                +-------+-----------+-----------+------------+------------+ Right ankle cuff was placed on proximal calf for patient comfort.  Summary: Right: Resting right ankle-brachial index indicates noncompressible right lower extremity arteries. Right ankle cuff was placed on proximal calf for patient comfort. Unable to obtain toe pressure due to gangrenous toe. Left: Resting left ankle-brachial index indicates noncompressible left lower extremity arteries.The left toe-brachial index is normal.  *See table(s) above for measurements and observations.  Electronically signed by Curt Jews MD on 09/02/2018 at 5:34:17 PM.   Final    [place] Ekg Site Rite  Result Date: 09/07/2018 If Site Rite image not attached, placement could not be confirmed due to current cardiac rhythm.    LOS: 7 days   Oren Binet, MD  [facility]  If 7PM-7AM, please contact night-coverage  Please page via [email or web address]-Password [email or web address]-click on MD name and type text message  09/08/2018, 12:17 PM

## 2018-09-08 NOTE — Progress Notes (Signed)
PT Cancellation Note  Patient Details Name: Jon [name] MRN: [id number] DOB: 01-Mar-1951   Cancelled Treatment:    Reason Eval/Treat Not Completed: (P) Medical issues which prohibited therapy;Patient at procedure or test/unavailable(Pt off unit for vascular surgery, will defer PT tx at this time.  )   [name] 09/08/2018, 3:45 PM  Governor Rooks, PTA Acute Rehabilitation Services Pager [phone] Office [phone]

## 2018-09-08 NOTE — Progress Notes (Signed)
Patient called the nurse due to being to "catch his breath".  Patient is currently wearing his CPAP mask but doesn't feel he's receiving "adequate amount of air". Patient removed the mask and sat on the end of the bed. Auscultated patient lungs.  Lungs clear but diminished bilaterally.  Contacted Respiratory to come assess the patient. Check patient vitals.  BP 172/92.  Notified Tylene Fantasia, NP. Will continue to monitor the patient and notify as needed

## 2018-09-08 NOTE — Op Note (Signed)
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  Ultrasound-guided access of the left common femoral artery 2.  CO2 aortogram 3.  Bilateral lower extremity arteriogram with runoff 4.  Right anterior tibial artery atherectomy (1.25 mm micro CSI atherectomy catheter) 5.  Right anterior tibial artery angioplasty (3 mm x 40 mm [place]) 6.  Right posterior tibial artery angioplasty (3 mm x 40 mm Sterling) 7.  110 minutes of monitored moderate conscious sedation time 8.  Mynx closure of left common femoral artery  Indications: Patient is a 68 year old male who was evaluated last week by Dr. Donzetta Matters for gangrenous right great toe as well as a ulcerated tip of a left toe.  At the time he had acute renal failure and vascular surgery waited for his renal function to improve in order to perform arteriogram with possible intervention.  He presents today for planned aortogram with bilateral lower extremity arteriogram and intervention on the right given the right foot has worse tissue loss.  Presents after risks and benefits are discussed.  Findings: CO2 aortogram showed no significant aortoiliac disease.  Right lower extremity arteriogram showed patent common femoral, profunda, SFA, popliteal.  On the right patient had a focal calcified high-grade stenosis of the proximal anterior tibial artery >80% and distally the dorsalis pedis appeared occluded in the foot.  In addition patient had a very diminutive and diseased peroneal.  The patient's posterior tibial appeared to be the dominant inline flow to the right lower extremity and was more brisk but there was a calcified focal stenosis in the distal posterior tibial just above the ankle that was  approximately 50%.  Ultimately the anterior tibial was treated with CSI atherectomy and the posterior tibial was treated with balloon angioplasty and there was no evidence of residual stenosis.  Final arteriogram of the right lower extremity showed brisk flow down the posterior tibial filling of the plantar arch as well as filling of the anterior tibial but no flow into the dorsalis pedis that appears chronically occluded.  Left lower extremity arteriogram showed a patent common femoral, profunda, SFA, above and below-knee popliteal artery.  It was difficult to evaluate the tibial runoff given that contrast timing and patient movement but it appeared that the anterior tibial and posterior tibial were patent but distal runoff into the foot was hard to evaluate.   Procedure:  The patient was identified in the holding area and taken to room 8.  The patient was then placed supine on the table and prepped and draped in the usual sterile fashion.  A time out was called.  Ultrasound was used to evaluate the left common femoral artery.  It was patent .  A digital ultrasound image was acquired.  A micropuncture needle was used to access the left common femoral artery under ultrasound guidance.  An 018 wire was advanced without resistance and a micropuncture sheath was placed.  The 018 wire was removed and a benson wire was placed.  The micropuncture sheath was exchanged for a 5 french sheath.  An omniflush catheter was advanced over the wire to the level of L-1.  An abdominal angiogram was obtained with CO2.  Next her catheter was pulled down to the aortic bifurcation and bilateral extremity arteriogram with runoff was obtained with  pertinent findings noted above..  Given that the right leg was the side with more extensive tissue loss and with a gangrenous toe we elected to intervene on his tibial disease giving him the best chance of healing a toe amp.  At that point in time we used a Glidewire advantage to cross the  aortic bifurcation and advanced our wire down to the right SFA and then exchanged for a long 6 [place] Ansell sheath in the left groin.  The patient was given 11,000 units of IV heparin.  We then advanced our wire down into the popliteal artery with the assistance of a quick cross catheter.  We then obtained some focal shots of the tibials to get better imaging to plan intervention.  Ultimately using a V 18 wire and a quick cross select we cannulated the anterior tibial artery over which we got our catheter to track.  We then exchanged for a Viper wire.  At that point in time we then ran a 1.25 mm micro CSI atherectomy catheter through the proximal anterior tibial calcified stenosis at low medium and high speeds.  This was then post angioplastied with a 3 mm x 40 mm balloon.  We injected nitro and a final shot showed a patent artery proximally with no residual stenosis.  I did advance my wire distally down to the distal anterior tibial and then used a catheter for small injections to further evaluate the dorsalis pedis that appeared occluded.  I did not see any target for recanalization and it appeared occluded throughout its course with collaterals on the top of the foot.  That point time I then pulled the wires and catheters back and then redirected V 18 down the posterior tibial into the plantar arch.  Then after some additional shots to localize the calcified lesion in the distal posterior tibial above the ankle this was treated with a 3 mm x 40 Sterling it was inflated inflated to low pressure.  Final arteriogram of the foot showed brisk filling down the posterior tibial with filling of the plantar arch as well as a little slower filling in the anterior tibial but no outflow through the dorsalis pedis given its chronic occlusion.  I think the patient is as good as he is going to be from our standpoint.  At that point in time we exchanged for a short 6 French sheath in the left groin.  Mynx closure device was  applied and left groin pressure was held.  Taken to PACU in stable condition.    Marty Heck, MD [facility] Office: [phone] Pager: [place]

## 2018-09-08 NOTE — Progress Notes (Signed)
ANTICOAGULATION CONSULT NOTE  Pharmacy Consult:  Heparin Indication: atrial fibrillation  Allergies  Allergen Reactions  . Hydrocodone Itching  . Morphine Itching  . Duloxetine Hcl Other (See Comments)  . Codeine Itching  . Penicillins Itching and Rash    DID THE REACTION INVOLVE: Swelling of the face/tongue/throat, SOB, or low BP? No Sudden or severe rash/hives, skin peeling, or the inside of the mouth or nose? No Did it require medical treatment? No When did it last happen?unknown If all above answers are "NO", may proceed with cephalosporin use.    Patient Measurements: Height: 5\' 7"  (170.2 cm) Weight: 235 lb (106.6 kg) IBW/kg (Calculated) : 66.1  Heparin dosing weight = 90 kg  Vital Signs: Temp: 99.1 F (37.3 C) (01/08 0437) Temp Source: Oral (01/08 0437) BP: 190/83 (01/08 0652) Pulse Rate: 68 (01/08 0437)  Labs: Recent Labs    09/06/18 0337 09/06/18 1408 09/07/18 0532 09/08/18 0356  HGB 12.6*  --  12.5* 12.5*  HCT 37.5*  --  37.4* 38.6*  PLT 317  --  317 329  HEPARINUNFRC 0.18* 0.30 0.50 0.17*  CREATININE 1.30*  --   --   --     Estimated Creatinine Clearance: 62.4 mL/min (A) (by C-G formula based on SCr of 1.3 mg/dL (H)).  Assessment: 31 YOM continues on IV heparin for history of Afib while Xarelto is on hold.  Heparin level is sub therapeutic this am.  No infusions problems noted  Goal of Therapy:  Heparin level 0.3-0.7 units/ml Monitor platelets by anticoagulation protocol: Yes   Plan:  Increase heparin drip to 1800 units/hr F/u resuming xarelto post angogram  Excell Seltzer, PharmD 09/08/2018 7:11 AM

## 2018-09-09 ENCOUNTER — Encounter (HOSPITAL_COMMUNITY)
Admission: AD | Disposition: A | Discharge status: Home Health | Payer: Self-pay | Source: Other Acute Inpatient Hospital | Attending: Internal Medicine

## 2018-09-09 ENCOUNTER — Inpatient Hospital Stay (HOSPITAL_COMMUNITY): Payer: Medicare Other | Admitting: Certified Registered Nurse Anesthetist

## 2018-09-09 ENCOUNTER — Encounter (HOSPITAL_COMMUNITY): Payer: Self-pay | Admitting: Vascular Surgery

## 2018-09-09 HISTORY — PX: AMPUTATION: SHX166

## 2018-09-09 LAB — CBC
HCT: 33.8 % — ABNORMAL LOW (ref 39.0–52.0)
Hemoglobin: 11.1 g/dL — ABNORMAL LOW (ref 13.0–17.0)
MCH: 29.1 pg (ref 26.0–34.0)
MCHC: 32.8 g/dL (ref 30.0–36.0)
MCV: 88.7 fL (ref 80.0–100.0)
Platelets: 309 10*3/uL (ref 150–400)
RBC: 3.81 MIL/uL — ABNORMAL LOW (ref 4.22–5.81)
RDW: 14.9 % (ref 11.5–15.5)
WBC: 11.9 10*3/uL — ABNORMAL HIGH (ref 4.0–10.5)
nRBC: 0 % (ref 0.0–0.2)

## 2018-09-09 LAB — BASIC METABOLIC PANEL
Anion gap: 7 (ref 5–15)
BUN: 18 mg/dL (ref 8–23)
CALCIUM: 8.3 mg/dL — AB (ref 8.9–10.3)
CO2: 24 mmol/L (ref 22–32)
CREATININE: 1.17 mg/dL (ref 0.61–1.24)
Chloride: 110 mmol/L (ref 98–111)
GFR calc Af Amer: >60 mL/min (ref >60)
GFR calc non Af Amer: >60 mL/min (ref >60)
Glucose, Bld: 172 mg/dL — ABNORMAL HIGH (ref 70–99)
Potassium: 3.9 mmol/L (ref 3.5–5.1)
SODIUM: 141 mmol/L (ref 135–145)

## 2018-09-09 LAB — GLUCOSE, CAPILLARY
Glucose-Capillary: 158 mg/dL — ABNORMAL HIGH (ref 70–99)
Glucose-Capillary: 140 mg/dL — ABNORMAL HIGH (ref 70–99)
Glucose-Capillary: 141 mg/dL — ABNORMAL HIGH (ref 70–99)
Glucose-Capillary: 142 mg/dL — ABNORMAL HIGH (ref 70–99)
Glucose-Capillary: 141 mg/dL — ABNORMAL HIGH (ref 70–99)
Glucose-Capillary: 128 mg/dL — ABNORMAL HIGH (ref 70–99)

## 2018-09-09 LAB — SURGICAL PCR SCREEN
MRSA, PCR: NEGATIVE
STAPHYLOCOCCUS AUREUS: NEGATIVE

## 2018-09-09 LAB — HEPARIN LEVEL (UNFRACTIONATED): Heparin Unfractionated: 0.21 IU/mL — ABNORMAL LOW (ref 0.30–0.70)

## 2018-09-09 SURGERY — AMPUTATION DIGIT
Anesthesia: General | Site: Foot | Laterality: Right

## 2018-09-09 MED ORDER — FENTANYL CITRATE (PF) 100 MCG/2ML IJ SOLN
INTRAMUSCULAR | Status: AC
  Filled 2018-09-09: qty 2

## 2018-09-09 MED ORDER — HYDROMORPHONE HCL 1 MG/ML IJ SOLN
0.5000 mg | INTRAMUSCULAR | Status: DC | PRN
  Administered 2018-09-09 – 2018-09-10 (×4): 0.5 mg via INTRAVENOUS
  Filled 2018-09-09 (×4): qty 1

## 2018-09-09 MED ORDER — SODIUM CHLORIDE 0.9 % IV SOLN
INTRAVENOUS | Status: DC | PRN
  Administered 2018-09-09: 30 ug/min via INTRAVENOUS

## 2018-09-09 MED ORDER — LIDOCAINE 2% (20 MG/ML) 5 ML SYRINGE
INTRAMUSCULAR | Status: DC | PRN
  Administered 2018-09-09: 100 mg via INTRAVENOUS

## 2018-09-09 MED ORDER — HEPARIN (PORCINE) 25000 UT/250ML-% IV SOLN
2050.0000 [IU]/h | INTRAVENOUS | Status: DC
  Administered 2018-09-09: 1950 [IU]/h via INTRAVENOUS
  Administered 2018-09-10: 2050 [IU]/h via INTRAVENOUS
  Filled 2018-09-09 (×2): qty 250

## 2018-09-09 MED ORDER — SODIUM CHLORIDE 0.9 % IV SOLN
INTRAVENOUS | Status: DC | PRN
  Administered 2018-09-09: 13:00:00 via INTRAVENOUS

## 2018-09-09 MED ORDER — ONDANSETRON HCL 4 MG/2ML IJ SOLN
INTRAMUSCULAR | Status: DC | PRN
  Administered 2018-09-09: 4 mg via INTRAVENOUS

## 2018-09-09 MED ORDER — FENTANYL CITRATE (PF) 250 MCG/5ML IJ SOLN
INTRAMUSCULAR | Status: AC
  Filled 2018-09-09: qty 5

## 2018-09-09 MED ORDER — FENTANYL CITRATE (PF) 100 MCG/2ML IJ SOLN
25.0000 ug | INTRAMUSCULAR | Status: DC | PRN
  Administered 2018-09-09 (×2): 50 ug via INTRAVENOUS

## 2018-09-09 MED ORDER — FENTANYL CITRATE (PF) 100 MCG/2ML IJ SOLN
INTRAMUSCULAR | Status: DC | PRN
  Administered 2018-09-09 (×2): 50 ug via INTRAVENOUS
  Administered 2018-09-09 (×2): 25 ug via INTRAVENOUS
  Administered 2018-09-09: 50 ug via INTRAVENOUS
  Administered 2018-09-09: 25 ug via INTRAVENOUS

## 2018-09-09 MED ORDER — PROPOFOL 10 MG/ML IV BOLUS
INTRAVENOUS | Status: AC
  Filled 2018-09-09: qty 20

## 2018-09-09 MED ORDER — ONDANSETRON HCL 4 MG/2ML IJ SOLN: 4.0000 mg | Freq: Once | INTRAMUSCULAR | Status: DC | PRN

## 2018-09-09 MED ORDER — PROPOFOL 10 MG/ML IV BOLUS
INTRAVENOUS | Status: DC | PRN
  Administered 2018-09-09: 200 mg via INTRAVENOUS

## 2018-09-09 MED ORDER — 0.9 % SODIUM CHLORIDE (POUR BTL) OPTIME
TOPICAL | Status: DC | PRN
  Administered 2018-09-09: 1000 mL

## 2018-09-09 MED FILL — Lidocaine HCl Local Preservative Free (PF) Inj 1%: INTRAMUSCULAR | Qty: 30 | Status: AC

## 2018-09-09 MED FILL — Clopidogrel Bisulfate Tab 300 MG (Base Equiv): ORAL | Qty: 1 | Status: AC

## 2018-09-09 SURGICAL SUPPLY — 33 items
BANDAGE ACE 4X5 VEL STRL LF (GAUZE/BANDAGES/DRESSINGS) ×2 IMPLANT
BLADE AVERAGE 25X9 (BLADE) ×1 IMPLANT
BLADE SAW SGTL 81X20 HD (BLADE) IMPLANT
BNDG GAUZE ELAST 4 BULKY (GAUZE/BANDAGES/DRESSINGS) ×2 IMPLANT
CANISTER SUCT 3000ML PPV (MISCELLANEOUS) ×2 IMPLANT
COVER SURGICAL LIGHT HANDLE (MISCELLANEOUS) ×3 IMPLANT
COVER WAND RF STERILE (DRAPES) ×1 IMPLANT
DRAPE EXTREMITY T 121X128X90 (DISPOSABLE) ×2 IMPLANT
DRAPE HALF SHEET 40X57 (DRAPES) ×2 IMPLANT
DRSG EMULSION OIL 3X3 NADH (GAUZE/BANDAGES/DRESSINGS) ×1 IMPLANT
DRSG PAD ABDOMINAL 8X10 ST (GAUZE/BANDAGES/DRESSINGS) ×1 IMPLANT
ELECT REM PT RETURN 9FT ADLT (ELECTROSURGICAL) ×2
ELECTRODE REM PT RTRN 9FT ADLT (ELECTROSURGICAL) ×1 IMPLANT
GAUZE SPONGE 4X4 12PLY STRL (GAUZE/BANDAGES/DRESSINGS) ×2 IMPLANT
GLOVE BIO SURGEON STRL SZ7.5 (GLOVE) ×2 IMPLANT
GOWN STRL REUS W/ TWL LRG LVL3 (GOWN DISPOSABLE) ×2 IMPLANT
GOWN STRL REUS W/ TWL XL LVL3 (GOWN DISPOSABLE) ×1 IMPLANT
GOWN STRL REUS W/TWL LRG LVL3 (GOWN DISPOSABLE) ×4
GOWN STRL REUS W/TWL XL LVL3 (GOWN DISPOSABLE) ×2
KIT BASIN OR (CUSTOM PROCEDURE TRAY) ×2 IMPLANT
KIT TURNOVER KIT B (KITS) ×2 IMPLANT
NDL HYPO 25GX1X1/2 BEV (NEEDLE) IMPLANT
NEEDLE HYPO 25GX1X1/2 BEV (NEEDLE) IMPLANT
NS IRRIG 1000ML POUR BTL (IV SOLUTION) ×2 IMPLANT
PACK GENERAL/GYN (CUSTOM PROCEDURE TRAY) ×2 IMPLANT
PAD ARMBOARD 7.5X6 YLW CONV (MISCELLANEOUS) ×4 IMPLANT
SUT ETHILON 3 0 PS 1 (SUTURE) ×3 IMPLANT
SUT VIC AB 2-0 CT1 27 (SUTURE) ×2
SUT VIC AB 2-0 CT1 TAPERPNT 27 (SUTURE) IMPLANT
SYR CONTROL 10ML LL (SYRINGE) IMPLANT
TOWEL GREEN STERILE (TOWEL DISPOSABLE) ×4 IMPLANT
UNDERPAD 30X30 (UNDERPADS AND DIAPERS) ×2 IMPLANT
WATER STERILE IRR 1000ML POUR (IV SOLUTION) ×2 IMPLANT

## 2018-09-09 NOTE — Anesthesia Procedure Notes (Signed)
Procedure Name: LMA Insertion Performed by: Destinie Thornsberry H, CRNA Pre-anesthesia Checklist: Patient identified, Emergency Drugs available, Suction available and Patient being monitored Patient Re-evaluated:Patient Re-evaluated prior to induction Oxygen Delivery Method: Circle System Utilized Preoxygenation: Pre-oxygenation with 100% oxygen Induction Type: IV induction Ventilation: Mask ventilation without difficulty LMA: LMA inserted LMA Size: 5.0 Number of attempts: 1 Airway Equipment and Method: Bite block Placement Confirmation: positive ETCO2 Tube secured with: Tape Dental Injury: Teeth and Oropharynx as per pre-operative assessment        

## 2018-09-09 NOTE — Progress Notes (Signed)
Physical Therapy Treatment Patient Details Name: Jon [name] MRN: [id number] DOB: 1951-07-21 Today's Date: 09/09/2018    History of Present Illness Patient is a 68 y/o male admitted on 09/01/17 from [facility] due to Right foot great toe gangrene, foot discoloration, pain. Consult by vascular surgery. Past medical history significant for diabetes mellitus type 2, atrial fibrillation, hypertension, COPD, sleep apnea.    PT Comments    Patient progressing steadily towards his goals, ambulating 200 feet with walker and min guard assist. HR increase to 150's intermittently towards end of ambulation. Cues provided for activity pacing and energy conservation techniques. Will reassess following planned right great toe amputation today.    Follow Up Recommendations  Home health PT;Supervision for mobility/OOB     Equipment Recommendations  None recommended by PT    Recommendations for Other Services       Precautions / Restrictions Precautions Precautions: Fall Precaution Comments: watch HR Restrictions Weight Bearing Restrictions: No    Mobility  Bed Mobility Overal bed mobility: Modified Independent             General bed mobility comments: increased time and effort - use of bed rails  Transfers Overall transfer level: Needs assistance Equipment used: Rolling walker (2 wheeled) Transfers: Sit to/from Stand Sit to Stand: Min guard         General transfer comment: Pt preferring to pull up on walker  Ambulation/Gait Ambulation/Gait assistance: Min guard Gait Distance (Feet): 200 Feet Assistive device: Rolling walker (2 wheeled) Gait Pattern/deviations: Step-through pattern;Decreased stride length;Antalgic Gait velocity: decreased   General Gait Details: Mildly antaglic gait with no overt LOB noted   Stairs             Wheelchair Mobility    Modified Rankin (Stroke Patients Only)       Balance Overall balance assessment: Needs  assistance Sitting-balance support: No upper extremity supported;Feet supported Sitting balance-Leahy Scale: Good     Standing balance support: Bilateral upper extremity supported;During functional activity Standing balance-Leahy Scale: Fair                              Cognition Arousal/Alertness: Awake/alert Behavior During Therapy: WFL for tasks assessed/performed Overall Cognitive Status: Within Functional Limits for tasks assessed                                        Exercises      General Comments        Pertinent Vitals/Pain Pain Assessment: No/denies pain    Home Living                      Prior Function            PT Goals (current goals can now be found in the care plan section) Acute Rehab PT Goals Patient Stated Goal: return home soon PT Goal Formulation: With patient Time For Goal Achievement: 09/20/18 Potential to Achieve Goals: Good Progress towards PT goals: Progressing toward goals    Frequency    Min 3X/week      PT Plan Current plan remains appropriate    Co-evaluation              AM-PAC PT "6 Clicks" Mobility   Outcome Measure  Help needed turning from your back to your side while in a flat bed  without using bedrails?: None Help needed moving from lying on your back to sitting on the side of a flat bed without using bedrails?: A Little Help needed moving to and from a bed to a chair (including a wheelchair)?: A Little Help needed standing up from a chair using your arms (e.g., wheelchair or bedside chair)?: A Little Help needed to walk in hospital room?: A Little Help needed climbing 3-5 steps with a railing? : A Little 6 Click Score: 19    End of Session Equipment Utilized During Treatment: Gait belt Activity Tolerance: Patient tolerated treatment well Patient left: with call bell/phone within reach;in bed Nurse Communication: Mobility status PT Visit Diagnosis: Unsteadiness on feet  (R26.81);Other abnormalities of gait and mobility (R26.89);History of falling (Z91.81)     Time: 9872-1587 PT Time Calculation (min) (ACUTE ONLY): 21 min  Charges:  $Therapeutic Activity: 8-22 mins                     Ellamae Sia, PT, DPT Acute Rehabilitation Services Pager [phone] Office [phone]    Willy Eddy 09/09/2018, 12:08 PM

## 2018-09-09 NOTE — Progress Notes (Signed)
Orthopedic Tech Progress Note Patient Details:  Jon [name] Sep 11, 1950 [id number]  Ortho Devices Type of Ortho Device: Darco shoe Ortho Device/Splint Location: rle Ortho Device/Splint Interventions: Ordered, Application, Adjustment   Post Interventions Patient Tolerated: Well Instructions Provided: Care of device, Adjustment of device   Karolee Stamps 09/09/2018, 4:55 PM

## 2018-09-09 NOTE — Progress Notes (Signed)
PROGRESS NOTE        PATIENT DETAILS Name: Jon [name] Age: 68 y.o. Sex: male Date of Birth: 07/31/1951 Admit Date: 09/01/2018 Admitting Physician Antonieta Pert, MD PCP:Cox, Elnita Maxwell, MD  Brief Narrative: Patient is a 68 y.o. male with history of DM-2, A. fib on Xarelto, hypertension, OSA on CPAP-transferred from [facility] for right great toe gangrene and MRSA bacteremia.  See below for further details  Subjective: No major issues overnight-denies any chest pain or shortness of breath.  Assessment/Plan: MRSA bacteremia: Clinically improved, no evidence of vegetation on TEE.  Recommendations from ID are to continue vancomycin-with stop date of 1/19.  PICC line placed on 1/7.  Repeat blood cultures on 1/2 are negative so far. .  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  AKI: Resolved.  Likely hemodynamically mediated-thought to have ATN in the setting of volume depletion, dehydration.  Creatinine has now normalized.  DM-2: CBG stable-continue SSI-we will resume oral hypoglycemic agents on discharge.  Dyslipidemia: Continue statin  Chronic atrial fibrillation: Rate controlled with metoprolol-Xarelto on hold-on IV heparin.  Plan to resume Xarelto on discharge.    Hypertension: Better controlled-still fluctuating-amlodipine and metoprolol.  Will follow and adjust accordingly   COPD: Stable-continue bronchodilators.  OSA: Continue CPAP nightly  Moderate malnutrition: Continue supplements  DVT Prophylaxis: Full dose anticoagulation with Heparin  Code Status: Full code   Family Communication: None at bedside  Disposition Plan: Remain inpatient-hopefully home in the next 1-2 days.  Antimicrobial agents: Anti-infectives  (From admission, onward)   Start     Dose/Rate Route Frequency Ordered Stop   09/08/18 1600  vancomycin (VANCOCIN) 1,250 mg in sodium chloride 0.9 % 250 mL IVPB     1,250 mg 166.7 mL/hr over 90 Minutes Intravenous Every 24 hours 09/07/18 1244     09/07/18 1600  vancomycin (VANCOCIN) 2,000 mg in sodium chloride 0.9 % 500 mL IVPB     2,000 mg 250 mL/hr over 120 Minutes Intravenous  Once 09/07/18 1244 09/07/18 1952   09/06/18 1700  meropenem (MERREM) 1 g in sodium chloride 0.9 % 100 mL IVPB  Status:  Discontinued     1 g 200 mL/hr over 30 Minutes Intravenous Every 8 hours 09/06/18 1246 09/07/18 1233   09/05/18 2000  DAPTOmycin (CUBICIN) 650 mg in sodium chloride 0.9 % IVPB  Status:  Discontinued     650 mg 226 mL/hr over 30 Minutes Intravenous Every 48 hours 09/04/18 0310 09/04/18 1531   09/05/18 2000  DAPTOmycin (CUBICIN) 650 mg in sodium chloride 0.9 % IVPB  Status:  Discontinued     650 mg 226 mL/hr over 30 Minutes Intravenous Daily 09/04/18 1531 09/07/18 1233   09/04/18 0800  DAPTOmycin (CUBICIN) 650 mg in sodium chloride 0.9 % IVPB     650 mg 226 mL/hr over 30 Minutes Intravenous  Once 09/04/18 0310 09/04/18 1521   09/03/18 0914  vancomycin variable dose per unstable renal function (pharmacist dosing)  Status:  Discontinued      Does not apply See admin instructions 09/03/18 0915 09/04/18 0310   09/02/18 1000  meropenem (MERREM) 1 g in sodium chloride 0.9 % 100 mL IVPB  Status:  Discontinued  1 g 200 mL/hr over 30 Minutes Intravenous Every 12 hours 09/02/18 0017 09/06/18 1246   09/02/18 0030  vancomycin (VANCOCIN) 1,750 mg in sodium chloride 0.9 % 500 mL IVPB  Status:  Discontinued     1,750 mg 250 mL/hr over 120 Minutes Intravenous Every 48 hours 09/02/18 0017 09/03/18 0915   09/01/18 2300  vancomycin (VANCOCIN) 2,000 mg in sodium chloride 0.9 % 500 mL IVPB  Status:  Discontinued     2,000 mg 250 mL/hr over 120 Minutes Intravenous  Once 09/01/18 2215 09/01/18 2353   09/01/18 2230   meropenem (MERREM) 2 g in sodium chloride 0.9 % 100 mL IVPB     2 g 200 mL/hr over 30 Minutes Intravenous  Once 09/01/18 2215 09/01/18 2346      Procedures: None  CONSULTS:  ID and vascular surgery  Time spent: 25- minutes-Greater than 50% of this time was spent in counseling, explanation of diagnosis, planning of further management, and coordination of care.  MEDICATIONS: Scheduled Meds: . amLODipine  5 mg Oral Daily  . aspirin EC  81 mg Oral Daily  . atorvastatin  40 mg Oral q1800  . clopidogrel  75 mg Oral Q breakfast  . feeding supplement (ENSURE ENLIVE)  237 mL Oral BID BM  . feeding supplement (PRO-STAT SUGAR FREE 64)  30 mL Oral BID  . insulin aspart  0-9 Units Subcutaneous TID WC  . metoprolol succinate  25 mg Oral Daily  . mometasone-formoterol  2 puff Inhalation BID  . multivitamin with minerals  1 tablet Oral Daily  . mupirocin ointment  1 application Nasal BID  . sodium chloride flush  10-40 mL Intracatheter Q12H  . sodium chloride flush  3 mL Intravenous Q12H   Continuous Infusions: . sodium chloride    . heparin 1,800 Units/hr (09/09/18 0344)  . vancomycin     PRN Meds:.sodium chloride, acetaminophen, albuterol, fentaNYL (SUBLIMAZE) injection, hydrALAZINE, ipratropium, labetalol, nitroGLYCERIN, ondansetron (ZOFRAN) IV, oxyCODONE, sodium chloride flush, sodium chloride flush, sodium chloride flush   PHYSICAL EXAM: Vital signs: Vitals:   09/08/18 2108 09/08/18 2148 09/09/18 0512 09/09/18 0808  BP:   (!) 155/87 (!) 165/93  Pulse: (!) 29 86 85 73  Resp: 20 (!) 31 18 15   Temp: 98.4 F (36.9 C)  98.7 F (37.1 C)   TempSrc: Oral  Oral   SpO2: 94%  96% 97%  Weight:      Height:       Filed Weights   09/01/18 1944  Weight: 106.6 kg   Body mass index is 36.81 kg/m.   General appearance:Awake, alert, not in any distress.  Eyes:no scleral icterus. HEENT: Atraumatic and Normocephalic Neck: supple, no JVD. Resp:Good air entry bilaterally,no rales or  rhonchi CVS: S1 S2 regular GI: Bowel sounds present, Non tender and not distended with no gaurding, rigidity or rebound. Extremities: B/L Lower Ext shows no edema Neurology:  Non focal Psychiatric: Normal judgment and insight. Normal mood. Musculoskeletal:No digital cyanosis Skin:No Rash, warm and dry Wounds:N/A  I have personally reviewed following labs and imaging studies  LABORATORY DATA: CBC: Recent Labs  Lab 09/05/18 0249 09/06/18 0337 09/07/18 0532 09/08/18 0356 09/09/18 0500  WBC 10.2 10.0 10.4 14.1* 11.9*  HGB 12.8* 12.6* 12.5* 12.5* 11.1*  HCT 38.7* 37.5* 37.4* 38.6* 33.8*  MCV 88.2 86.0 87.6 87.1 88.7  PLT 309 317 317 329 324    Basic Metabolic Panel: Recent Labs  Lab 09/04/18 1016 09/05/18 0249 09/06/18 0337 09/08/18 1228 09/09/18 0500  NA  136 136 139 139 141  K 3.4* 3.9 3.3* 3.7 3.9  CL 102 104 106 109 110  CO2 25 22 24 23 24   GLUCOSE 173* 151* 146* 284* 172*  BUN 35* 34* 26* 19 18  CREATININE 2.11* 1.79* 1.30* 1.17 1.17  CALCIUM 8.6* 8.6* 8.5* 8.9 8.3*    GFR: Estimated Creatinine Clearance: 69.4 mL/min (by C-G formula based on SCr of 1.17 mg/dL).  Liver Function Tests: No results for input(s): AST, ALT, ALKPHOS, BILITOT, PROT, ALBUMIN in the last 168 hours. No results for input(s): LIPASE, AMYLASE in the last 168 hours. No results for input(s): AMMONIA in the last 168 hours.  Coagulation Profile: No results for input(s): INR, PROTIME in the last 168 hours.  Cardiac Enzymes: No results for input(s): CKTOTAL, CKMB, CKMBINDEX, TROPONINI in the last 168 hours.  BNP (last 3 results) No results for input(s): PROBNP in the last 8760 hours.  HbA1C: No results for input(s): HGBA1C in the last 72 hours.  CBG: Recent Labs  Lab 09/08/18 0818 09/08/18 1208 09/08/18 1823 09/08/18 2056 09/09/18 0621  GLUCAP 146* 248* 180* 186* 158*    Lipid Profile: No results for input(s): CHOL, HDL, LDLCALC, TRIG, CHOLHDL, LDLDIRECT in the last 72  hours.  Thyroid Function Tests: No results for input(s): TSH, T4TOTAL, FREET4, T3FREE, THYROIDAB in the last 72 hours.  Anemia Panel: No results for input(s): VITAMINB12, FOLATE, FERRITIN, TIBC, IRON, RETICCTPCT in the last 72 hours.  Urine analysis:    Component Value Date/Time   COLORURINE YELLOW 09/03/2018 1555   APPEARANCEUR HAZY (A) 09/03/2018 1555   LABSPEC 1.011 09/03/2018 1555   PHURINE 5.0 09/03/2018 1555   GLUCOSEU NEGATIVE 09/03/2018 1555   HGBUR SMALL (A) 09/03/2018 1555   BILIRUBINUR NEGATIVE 09/03/2018 1555   KETONESUR NEGATIVE 09/03/2018 1555   PROTEINUR NEGATIVE 09/03/2018 1555   UROBILINOGEN 0.2 01/30/2014 2134   NITRITE NEGATIVE 09/03/2018 1555   LEUKOCYTESUR NEGATIVE 09/03/2018 1555    Sepsis Labs: Lactic Acid, Venous    Component Value Date/Time   LATICACIDVEN 1.7 01/18/2014 2150    MICROBIOLOGY: Recent Results (from the past 240 hour(s))  Culture, blood (Routine X 2) w Reflex to ID Panel     Status: None   Collection Time: 09/02/18  2:24 PM  Result Value Ref Range Status   Specimen Description BLOOD BLOOD LEFT HAND  Final   Special Requests AEROBIC BOTTLE ONLY Blood Culture adequate volume  Final   Culture   Final    NO GROWTH 5 DAYS Performed at [facility], [place] [address] [place], [place] [address]    Report Status 09/07/2018 FINAL  Final  Culture, blood (Routine X 2) w Reflex to ID Panel     Status: None   Collection Time: 09/02/18  2:24 PM  Result Value Ref Range Status   Specimen Description BLOOD BLOOD LEFT HAND  Final   Special Requests AEROBIC BOTTLE ONLY Blood Culture adequate volume  Final   Culture   Final    NO GROWTH 5 DAYS Performed at [facility], [place] [address], [place], [place] [address]    Report Status 09/07/2018 FINAL  Final  Surgical PCR screen     Status: None   Collection Time: 09/08/18  9:34 PM  Result Value Ref Range Status   MRSA, PCR NEGATIVE NEGATIVE Final   Staphylococcus aureus NEGATIVE  NEGATIVE Final    Comment: (NOTE) The Xpert SA Assay (FDA approved for NASAL specimens in patients 16 years of age  and older), is one component of a comprehensive surveillance program. It is not intended to diagnose infection nor to guide or monitor treatment. Performed at [facility], [place] [address] [place], [place] [address]     RADIOLOGY STUDIES/RESULTS: US Renal  Result Date: 09/03/2018 CLINICAL DATA:  Acute renal failure. EXAM: RENAL / URINARY TRACT ULTRASOUND COMPLETE COMPARISON:  Ultrasound of June 14, 2009. FINDINGS: Right Kidney: Renal measurements: 13.8 x 5.4 x 4.9 cm = volume: 192 mL . Echogenicity within normal limits. No mass or hydronephrosis visualized. Left Kidney: Renal measurements: 12.3 x 5.8 x 5.0 cm = volume: 202 mL. Echogenicity within normal limits. No mass or hydronephrosis visualized. Bladder: Decompressed secondary to Foley catheter. IMPRESSION: No renal abnormality seen. Electronically Signed   By: Marijo Conception, M.D.   On: 09/03/2018 10:12   Dg Chest Port 1 View  Result Date: 09/07/2018 CLINICAL DATA:  68 y/o  M; PICC line placement. EXAM: PORTABLE CHEST 1 VIEW COMPARISON:  09/07/2018 chest radiograph FINDINGS: Right PICC line tip projects over mid SVC. Stable cardiomegaly given projection and technique. Aortic calcific atherosclerosis. Stable ill-defined left basilar opacity. Pulmonary vascular congestion. Small left effusion. No pneumothorax. No acute osseous abnormality identified. IMPRESSION: 1. Right PICC line tip projects over mid SVC. 2. Stable cardiomegaly, pulmonary vascular congestion, left basilar opacity, and small left effusion. Electronically Signed   By: Kristine Garbe M.D.   On: 09/07/2018 17:16   Dg Chest Port 1 View  Result Date: 09/07/2018 CLINICAL DATA:  PICC line placement EXAM: PORTABLE CHEST 1 VIEW COMPARISON:  Chest x-ray of 08/03/2017 FINDINGS: The tip of the right PICC line crosses the midline into the left innominate  vein. This PICC line could be pulled back by approximately 3.5 cm. No pneumothorax is seen. There is opacity medially at the left lung base which may represent pneumonia. Also there may be a small left pleural effusion present. The right lung appears clear. Cardiomegaly is stable. There are degenerative changes in the mid to lower thoracic spine. IMPRESSION: 1. Right-sided PICC line tip crosses the midline into the left innominate vein. 2. Parenchymal opacity medially at the left lung base suspicious for pneumonia and possible small left pleural effusion. Electronically Signed   By: Ivar Drape M.D.   On: 09/07/2018 16:38   Vas [place] Burnard Bunting With/wo Tbi  Result Date: 09/02/2018 LOWER EXTREMITY DOPPLER STUDY Indications: Peripheral artery disease.  Performing Technologist: Carlos Levering RVT  Examination Guidelines: A complete evaluation includes at minimum, Doppler waveform signals and systolic blood pressure reading at the level of bilateral brachial, anterior tibial, and posterior tibial arteries, when vessel segments are accessible. Bilateral testing is considered an integral part of a complete examination. Photoelectric Plethysmograph (PPG) waveforms and toe systolic pressure readings are included as required and additional duplex testing as needed. Limited examinations for reoccurring indications may be performed as noted.  ABI Findings: +---------+------------------+-----+---------+--------+ Right    Rt Pressure (mmHg)IndexWaveform Comment  +---------+------------------+-----+---------+--------+ Brachial 101                    triphasic         +---------+------------------+-----+---------+--------+ PTA      152               1.50 biphasic          +---------+------------------+-----+---------+--------+ DP       104               1.03 biphasic          +---------+------------------+-----+---------+--------+  Great Toe                                Gangrene  +---------+------------------+-----+---------+--------+ +---------+------------------+-----+---------+-------+ Left     Lt Pressure (mmHg)IndexWaveform Comment +---------+------------------+-----+---------+-------+ Brachial                                 IV      +---------+------------------+-----+---------+-------+ PTA      254               2.51 triphasic        +---------+------------------+-----+---------+-------+ DP       191               1.89 triphasic        +---------+------------------+-----+---------+-------+ Great Toe76                0.75                  +---------+------------------+-----+---------+-------+ +-------+-----------+-----------+------------+------------+ ABI/TBIToday's ABIToday's TBIPrevious ABIPrevious TBI +-------+-----------+-----------+------------+------------+ Right  1.5                                            +-------+-----------+-----------+------------+------------+ Left   2.51       0.75                                +-------+-----------+-----------+------------+------------+ Right ankle cuff was placed on proximal calf for patient comfort.  Summary: Right: Resting right ankle-brachial index indicates noncompressible right lower extremity arteries. Right ankle cuff was placed on proximal calf for patient comfort. Unable to obtain toe pressure due to gangrenous toe. Left: Resting left ankle-brachial index indicates noncompressible left lower extremity arteries.The left toe-brachial index is normal.  *See table(s) above for measurements and observations.  Electronically signed by Curt Jews MD on 09/02/2018 at 5:34:17 PM.   Final    [place] Ekg Site Rite  Result Date: 09/07/2018 If Site Rite image not attached, placement could not be confirmed due to current cardiac rhythm.    LOS: 8 days   Oren Binet, MD  [facility]  If 7PM-7AM, please contact night-coverage  Please page via [email or web address]  on MD name and type text message  09/09/2018, 9:57 AM

## 2018-09-09 NOTE — Anesthesia Postprocedure Evaluation (Signed)
Anesthesia Post Note  Patient: Jon [name]  Procedure(s) Performed: AMPUTATION RIGHT GREAT TOE (Right Foot)     Patient location during evaluation: PACU Anesthesia Type: General Level of consciousness: awake and alert Pain management: pain level controlled Vital Signs Assessment: post-procedure vital signs reviewed and stable Respiratory status: spontaneous breathing, nonlabored ventilation, respiratory function stable and patient connected to nasal cannula oxygen Cardiovascular status: blood pressure returned to baseline and stable Postop Assessment: no apparent nausea or vomiting Anesthetic complications: no    Last Vitals:  Vitals:   09/09/18 1455 09/09/18 1513  BP:  (!) 152/80  Pulse: (!) 59 72  Resp: 17 15  Temp:  36.6 C  SpO2: 95% 95%    Last Pain:  Vitals:   09/09/18 1513  TempSrc: Oral  PainSc:                  [name]

## 2018-09-09 NOTE — Transfer of Care (Signed)
Immediate Anesthesia Transfer of Care Note  Patient: Jon [name]  Procedure(s) Performed: AMPUTATION RIGHT GREAT TOE (Right Foot)  Patient Location: PACU  Anesthesia Type:General  Level of Consciousness: awake, alert  and oriented  Airway & Oxygen Therapy: Patient Spontanous Breathing and Patient connected to nasal cannula oxygen  Post-op Assessment: Report given to RN and Post -op Vital signs reviewed and stable  Post vital signs: Reviewed and stable  Last Vitals:  Vitals Value Taken Time  BP 128/64 09/09/2018  2:46 PM  Temp    Pulse 53 09/09/2018  2:51 PM  Resp 13 09/09/2018  2:51 PM  SpO2 93 % 09/09/2018  2:51 PM  Vitals shown include unvalidated device data.  Last Pain:  Vitals:   09/09/18 1445  TempSrc:   PainSc: 7       Patients Stated Pain Goal: 0 (07/17/[birth year] 0802)  Complications: No apparent anesthesia complications

## 2018-09-09 NOTE — Anesthesia Preprocedure Evaluation (Addendum)
Anesthesia Evaluation  Patient identified by MRN, date of birth, ID band Patient awake    Reviewed: Allergy & Precautions, NPO status , Patient's Chart, lab work & pertinent test results  Airway Mallampati: II  TM Distance: >3 FB Neck ROM: Full    Dental  (+) Dental Advisory Given   Pulmonary asthma , sleep apnea and Continuous Positive Airway Pressure Ventilation , COPD, former smoker,    breath sounds clear to auscultation       Cardiovascular hypertension, Pt. on medications + CAD and + Past MI  + dysrhythmias Atrial Fibrillation  Rhythm:Regular Rate:Normal  Normal EF. Mod MR, Mod TR   Neuro/Psych  Neuromuscular disease CVA    GI/Hepatic Neg liver ROS, hiatal hernia, GERD  ,  Endo/Other  diabetesHypothyroidism   Renal/GU Renal disease     Musculoskeletal  (+) Arthritis , Fibromyalgia -  Abdominal   Peds  Hematology  (+) anemia ,   Anesthesia Other Findings   Reproductive/Obstetrics                             Lab Results  Component Value Date   WBC 11.9 (H) 09/09/2018   HGB 11.1 (L) 09/09/2018   HCT 33.8 (L) 09/09/2018   MCV 88.7 09/09/2018   PLT 309 09/09/2018   Lab Results  Component Value Date   CREATININE 1.17 09/09/2018   BUN 18 09/09/2018   NA 141 09/09/2018   K 3.9 09/09/2018   CL 110 09/09/2018   CO2 24 09/09/2018    Anesthesia Physical Anesthesia Plan  ASA: IV  Anesthesia Plan: General   Post-op Pain Management:    Induction: Intravenous  PONV Risk Score and Plan: 2 and Ondansetron, Dexamethasone and Treatment may vary due to age or medical condition  Airway Management Planned: LMA  Additional Equipment:   Intra-op Plan:   Post-operative Plan: Extubation in OR  Informed Consent: I have reviewed the patients History and Physical, chart, labs and discussed the procedure including the risks, benefits and alternatives for the proposed anesthesia with the  patient or authorized representative who has indicated his/her understanding and acceptance.   Dental advisory given  Plan Discussed with: CRNA  Anesthesia Plan Comments:        Anesthesia Quick Evaluation

## 2018-09-09 NOTE — Care Management Note (Signed)
Case Management Note  Patient Details  Name: Jon [name] MRN: [id number] Date of Birth: 21-Apr-1951  Subjective/Objective:                    Action/Plan:  Verified with patient and wife at bedside that they would like to use [facility] for DME and HH. Notified Pam w [facility] that patient will DC tomorrow, needing IV Abx and HH RN PT. Patient stated he has all DME needed at home. Patient currently on Vanc Q24' at 4pm. Please verify with CM on day of DC if dose needs to be given prior to DC.    Expected Discharge Date:                  Expected Discharge Plan:  [place]  In-House Referral:  NA  Discharge planning Services  CM Consult  Post Acute Care Choice:  Resumption of Svcs/PTA Provider, Home Health([place]/ home oxygen) Choice offered to:  Patient  DME Arranged:  IV pump/equipment DME Agency:  [place] Arranged:  RN, PT [facility] Agency:  [place]  Status of Service:  Completed, signed off  If discussed at [place] of Stay Meetings, dates discussed:    Additional Comments:  Carles Collet, RN 09/09/2018, 10:48 AM

## 2018-09-09 NOTE — Op Note (Signed)
    Patient name: Jon [name] MRN: [id number] DOB: 08-Jul-1951 Sex: male  09/09/2018 Pre-operative Diagnosis: Necrosis of right great toe Post-operative diagnosis:  Same Surgeon:  Eda Paschal. Donzetta Matters, MD Procedure Performed: Transmetatarsal amputation of right toe  Indications: 68 year old male presented with gangrene of his right great toe.  He is undergone revascularization of both his anterior tibial and posterior tibial arteries now has significantly improved flow and is indicated for first toe amputation.  Findings: There is necrosis on the plantar aspect of the first toe tracking back to the mid first metatarsal.  Metatarsal was transected there.  All tissue was viable at completion and the wound was primarily closed.   Procedure:  The patient was identified in the holding area and taken to the operating room where he was sterilely prepped and draped the right lower extremity after general anesthesia was induced.  Antibiotics were up-to-date.  A timeout was called.  I began with a tennis racquet type incision around the first toe.  I did remove significant tissue on the plantar aspect back to get the bleeding tissue.  Periosteum was elevated on the first metatarsal.  A power saw was used to transect the first metatarsal with anterior bevel.  I obtained hemostasis irrigated thoroughly.  I closed the deep tissue over the bone with interrupted 2-0 Vicryl suture.  Skin was closed with 3-0 nylon.  A sterile dressing was placed.  His lateral ankle anesthesia having tolerated procedure without immediate complication.  All counts were correct at completion.  EBL: 20 cc   Brandon C. Donzetta Matters, MD [facility] [place] Office: [phone] Pager: [phone]

## 2018-09-09 NOTE — Progress Notes (Addendum)
ANTICOAGULATION CONSULT NOTE  Pharmacy Consult:  Heparin Indication: atrial fibrillation  Allergies  Allergen Reactions  . Hydrocodone Itching  . Morphine Itching  . Duloxetine Hcl Other (See Comments)  . Codeine Itching  . Penicillins Itching and Rash    DID THE REACTION INVOLVE: Swelling of the face/tongue/throat, SOB, or low BP? No Sudden or severe rash/hives, skin peeling, or the inside of the mouth or nose? No Did it require medical treatment? No When did it last happen?unknown If all above answers are "NO", may proceed with cephalosporin use.    Patient Measurements: Height: 5\' 7"  (170.2 cm) Weight: 235 lb (106.6 kg) IBW/kg (Calculated) : 66.1  Heparin dosing weight = 90 kg  Vital Signs: Temp: 98.7 F (37.1 C) (01/09 0512) Temp Source: Oral (01/09 0512) BP: 165/93 (01/09 0808) Pulse Rate: 73 (01/09 0808)  Labs: Recent Labs    09/07/18 0532 09/08/18 0356 09/08/18 1228 09/09/18 0500 09/09/18 0612  HGB 12.5* 12.5*  --  11.1*  --   HCT 37.4* 38.6*  --  33.8*  --   PLT 317 329  --  309  --   HEPARINUNFRC 0.50 0.17*  --   --  0.21*  CREATININE  --   --  1.17 1.17  --     Estimated Creatinine Clearance: 69.4 mL/min (by C-G formula based on SCr of 1.17 mg/dL).  Assessment: 6 YOM continues on IV heparin for history of Afib while Xarelto is on hold. Heparin level this AM = 0.21 s/p arteriogram  Goal of Therapy:  Heparin level 0.3-0.7 units/ml Monitor platelets by anticoagulation protocol: Yes   Plan:  Increase heparin to 1950 units / hr Follow up AM labs  Thank you Anette Guarneri, PharmD [phone] 09/09/2018 9:08 AM

## 2018-09-09 NOTE — Progress Notes (Signed)
  Progress Note    09/09/2018 1:08 PM Day of Surgery  Subjective:  No overnight issues  Vitals:   09/09/18 0512 09/09/18 0808  BP: (!) 155/87 (!) 165/93  Pulse: 85 73  Resp: 18 15  Temp: 98.7 F (37.1 C)   SpO2: 96% 97%    Physical Exam: aaox3  Nonlabored respirations Palpable right dorsalis pedis pulse Right great toe with dry gangrene extending back to the base the metatarsal head  CBC    Component Value Date/Time   WBC 11.9 (H) 09/09/2018 0500   RBC 3.81 (L) 09/09/2018 0500   HGB 11.1 (L) 09/09/2018 0500   HCT 33.8 (L) 09/09/2018 0500   PLT 309 09/09/2018 0500   MCV 88.7 09/09/2018 0500   MCH 29.1 09/09/2018 0500   MCHC 32.8 09/09/2018 0500   RDW 14.9 09/09/2018 0500   LYMPHSABS 1.1 09/01/2018 2157   MONOABS 0.8 09/01/2018 2157   EOSABS 0.0 09/01/2018 2157   BASOSABS 0.0 09/01/2018 2157    BMET    Component Value Date/Time   NA 141 09/09/2018 0500   K 3.9 09/09/2018 0500   CL 110 09/09/2018 0500   CO2 24 09/09/2018 0500   GLUCOSE 172 (H) 09/09/2018 0500   BUN 18 09/09/2018 0500   CREATININE 1.17 09/09/2018 0500   CALCIUM 8.3 (L) 09/09/2018 0500   GFRNONAA >60 09/09/2018 0500   GFRAA >60 09/09/2018 0500    INR    Component Value Date/Time   INR 1.68 09/01/2018 2157     Intake/Output Summary (Last 24 hours) at 09/09/2018 1308 Last data filed at 09/09/2018 1157 Gross per 24 hour  Intake 729.34 ml  Output 750 ml  Net -20.Jon ml     Assessment/plan:  68 y.o. Jon [name] is s/p endovascular revascularization right lower extremity.  On heparin for atrial fibrillation and will likely be able to transition to Xarelto as long as there are no bleeding complications from surgery today.  We will plan for to amputation on the right today and this may need to be left open given the extent of necrosis.  I discontinued aspirin we will continue him on Plavix given recent intervention.   Jeanet Lupe C. Donzetta Matters, MD [facility] [place] Office:  [phone] Pager: [phone]  09/09/2018 1:08 PM

## 2018-09-10 ENCOUNTER — Encounter (HOSPITAL_COMMUNITY): Payer: Self-pay | Admitting: Vascular Surgery

## 2018-09-10 DIAGNOSIS — Z9989 Dependence on other enabling machines and devices: Secondary | ICD-10-CM

## 2018-09-10 DIAGNOSIS — G4733 Obstructive sleep apnea (adult) (pediatric): Secondary | ICD-10-CM

## 2018-09-10 DIAGNOSIS — I5032 Chronic diastolic (congestive) heart failure: Secondary | ICD-10-CM

## 2018-09-10 LAB — GLUCOSE, CAPILLARY
GLUCOSE-CAPILLARY: 152 mg/dL — AB (ref 70–99)
Glucose-Capillary: 141 mg/dL — ABNORMAL HIGH (ref 70–99)
Glucose-Capillary: 151 mg/dL — ABNORMAL HIGH (ref 70–99)

## 2018-09-10 LAB — BASIC METABOLIC PANEL
Anion gap: 7 (ref 5–15)
BUN: 17 mg/dL (ref 8–23)
CO2: 23 mmol/L (ref 22–32)
Calcium: 8.1 mg/dL — ABNORMAL LOW (ref 8.9–10.3)
Chloride: 109 mmol/L (ref 98–111)
Creatinine, Ser: 1.27 mg/dL — ABNORMAL HIGH (ref 0.61–1.24)
GFR calc Af Amer: >60 mL/min (ref >60)
GFR calc non Af Amer: 57 mL/min — ABNORMAL LOW (ref >60)
Glucose, Bld: 138 mg/dL — ABNORMAL HIGH (ref 70–99)
Potassium: 3.7 mmol/L (ref 3.5–5.1)
Sodium: 139 mmol/L (ref 135–145)

## 2018-09-10 LAB — CBC
HCT: 32.6 % — ABNORMAL LOW (ref 39.0–52.0)
Hemoglobin: 10.3 g/dL — ABNORMAL LOW (ref 13.0–17.0)
MCH: 28.3 pg (ref 26.0–34.0)
MCHC: 31.6 g/dL (ref 30.0–36.0)
MCV: 89.6 fL (ref 80.0–100.0)
Platelets: 284 10*3/uL (ref 150–400)
RBC: 3.64 MIL/uL — ABNORMAL LOW (ref 4.22–5.81)
RDW: 15.1 % (ref 11.5–15.5)
WBC: 10.1 10*3/uL (ref 4.0–10.5)
nRBC: 0 % (ref 0.0–0.2)

## 2018-09-10 LAB — HEPARIN LEVEL (UNFRACTIONATED): Heparin Unfractionated: 0.27 IU/mL — ABNORMAL LOW (ref 0.30–0.70)

## 2018-09-10 MED ORDER — RIVAROXABAN 15 MG PO TABS
15.0000 mg | ORAL_TABLET | Freq: Every day | ORAL | Status: DC
  Administered 2018-09-10: 15 mg via ORAL
  Filled 2018-09-10: qty 1

## 2018-09-10 MED ORDER — RIVAROXABAN 20 MG PO TABS: 20.0000 mg | ORAL_TABLET | Freq: Every day | ORAL | Status: DC

## 2018-09-10 NOTE — Progress Notes (Signed)
ANTICOAGULATION CONSULT NOTE  Pharmacy Consult:  Heparin Indication: atrial fibrillation  Allergies  Allergen Reactions  . Hydrocodone Itching  . Morphine Itching  . Duloxetine Hcl Other (See Comments)  . Codeine Itching  . Penicillins Itching and Rash    DID THE REACTION INVOLVE: Swelling of the face/tongue/throat, SOB, or low BP? No Sudden or severe rash/hives, skin peeling, or the inside of the mouth or nose? No Did it require medical treatment? No When did it last happen?unknown If all above answers are "NO", may proceed with cephalosporin use.    Patient Measurements: Height: 5\' 7"  (170.2 cm) Weight: 235 lb (106.6 kg) IBW/kg (Calculated) : 66.1  Heparin dosing weight = 90 kg  Vital Signs: Temp: 97.9 F (36.6 C) (01/10 0420) Temp Source: Axillary (01/10 0420) BP: 155/63 (01/10 0420) Pulse Rate: 45 (01/10 0420)  Labs: Recent Labs    09/08/18 0356 09/08/18 1228 09/09/18 0500 09/09/18 0612 09/10/18 0515  HGB 12.5*  --  11.1*  --  10.3*  HCT 38.6*  --  33.8*  --  32.6*  PLT 329  --  309  --  284  HEPARINUNFRC 0.17*  --   --  0.21* 0.27*  CREATININE  --  1.17 1.17  --  1.27*    Estimated Creatinine Clearance: 63.9 mL/min (A) (by C-G formula based on SCr of 1.27 mg/dL (H)).  Assessment: 35 YOM continues on IV heparin for history of Afib while Xarelto is on hold. Heparin level this AM = 0.27   Goal of Therapy:  Heparin level 0.3-0.7 units/ml Monitor platelets by anticoagulation protocol: Yes   Plan:  Increase heparin to 2050 units / hr Follow up Heparin level and CBC with AM labs   Alanda Slim, PharmD, [facility] Clinical Pharmacist Please see AMION for all Pharmacists' Contact Phone Numbers 09/10/2018, 8:06 AM

## 2018-09-10 NOTE — Progress Notes (Signed)
Physical Therapy Treatment Patient Details Name: Jon [name] MRN: [id number] DOB: 1950/10/02 Today's Date: 09/10/2018    History of Present Illness Patient is a 68 y/o male admitted on 09/01/17 from [facility] due to Right foot great toe gangrene, foot discoloration, pain. 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  Past medical history significant for diabetes mellitus type 2, atrial fibrillation, hypertension, COPD, sleep apnea.    PT Comments    Pt now s/p rt great toe amputation. Pt able to amb with walker and Darco shoe. Pt aware of impact of Darco shoe on balance and proceeded in an appropriately cautious manner. Pt's HR to 140's with amb and dyspnea 3/4 and SpO2 95% on RA. Pt has family that is taking time off from work to stay with him at dc. Should be able to return home with family when medically ready.    Follow Up Recommendations  Home health PT;Supervision for mobility/OOB     Equipment Recommendations  None recommended by PT    Recommendations for Other Services       Precautions / Restrictions Precautions Precautions: Fall Precaution Comments: watch HR Restrictions Weight Bearing Restrictions: No    Mobility  Bed Mobility Overal bed mobility: Modified Independent             General bed mobility comments: incr time  Transfers Overall transfer level: Needs assistance Equipment used: Rolling walker (2 wheeled) Transfers: Sit to/from Stand Sit to Stand: Min guard         General transfer comment: Assist for safety  Ambulation/Gait Ambulation/Gait assistance: Min guard Gait Distance (Feet): 80 Feet Assistive device: Rolling walker (2 wheeled) Gait Pattern/deviations: Step-through pattern;Decreased stride length;Antalgic;Decreased stance time - right;Decreased step length - left Gait velocity: decreased Gait velocity interpretation: <1.31 ft/sec, indicative of household ambulator General Gait Details: Assist for safety. Slightly unsteady  with Darco shoe but no loss of balance and pt with good awareness. HR to 140's with amb   Stairs             Wheelchair Mobility    Modified Rankin (Stroke Patients Only)       Balance Overall balance assessment: Needs assistance Sitting-balance support: No upper extremity supported;Feet supported Sitting balance-Leahy Scale: Good     Standing balance support: Bilateral upper extremity supported;During functional activity Standing balance-Leahy Scale: Poor Standing balance comment: walker and supervision for static standing                            Cognition Arousal/Alertness: Awake/alert Behavior During Therapy: WFL for tasks assessed/performed Overall Cognitive Status: Within Functional Limits for tasks assessed                                        Exercises      General Comments        Pertinent Vitals/Pain Pain Assessment: Faces Faces Pain Scale: Hurts little more Pain Location: rt foot Pain Descriptors / Indicators: Grimacing;Guarding Pain Intervention(s): Limited activity within patient's tolerance;Monitored during session;Repositioned    Home Living                      Prior Function            PT Goals (current goals can now be found in the care plan section) Progress towards PT goals: Progressing toward goals  Frequency    Min 3X/week      PT Plan Current plan remains appropriate    Co-evaluation              AM-PAC PT "6 Clicks" Mobility   Outcome Measure  Help needed turning from your back to your side while in a flat bed without using bedrails?: None Help needed moving from lying on your back to sitting on the side of a flat bed without using bedrails?: None Help needed moving to and from a bed to a chair (including a wheelchair)?: A Little Help needed standing up from a chair using your arms (e.g., wheelchair or bedside chair)?: A Little Help needed to walk in hospital room?: A  Little Help needed climbing 3-5 steps with a railing? : A Little 6 Click Score: 20    End of Session Equipment Utilized During Treatment: Gait belt Activity Tolerance: Patient tolerated treatment well Patient left: with call bell/phone within reach;in bed Nurse Communication: Mobility status PT Visit Diagnosis: Unsteadiness on feet (R26.81);Other abnormalities of gait and mobility (R26.89);History of falling (Z91.81)     Time: 4709-6283 PT Time Calculation (min) (ACUTE ONLY): 24 min  Charges:  $Gait Training: 23-37 mins                     [place] Pager [phone] Office [place] 09/10/2018, 3:13 PM

## 2018-09-10 NOTE — Progress Notes (Signed)
PROGRESS NOTE    Jon [name]  ZOX:[id number] DOB: [birth year] DOA: 09/01/2018 PCP: Rochel Brome, MD   Brief Narrative:  HPI on 09/01/2018 by Dr. Gean Birchwood Jon [name] is a 68 y.o. male with history of diabetes mellitus type 2, atrial fibrillation, hypertension, COPD, sleep apnea has been experiencing increasing pain and swelling in his right foot over the last 1 month.  Over the last few days he noticed increasing discoloration of his great toe and has been admitted at [facility] for cellulitis and developing gangrene of his right great toe.  Patient has had some subjective feeling of fever and chills.  Interim history Patient was admitted for right great toe gangrene along with MRSA bacteremia.  TEE showed no evidence of vegetation.  Infectious disease consulted recommended vancomycin with a stop date of 09/19/2018.  Patient also underwent angiogram with right great toe amputation by vascular surgery.  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 -Status post right great toe amputation on 09/09/2018 -PT recommended home health therapy -Discussed with Dr. Donzetta Matters, vascular surgeon, may resume Xarelto. -Aspirin discontinued, continue Plavix given recent intervention  Acute kidney injury -Thought to have been due to ATN in the setting of volume depletion and dehydration -Resolved, continue to monitor BMP  Diabetes mellitus, type II -Continue insulin sliding scale with CBG monitoring -Home medications held  Dyslipidemia -Continue  statin  Chronic atrial fibrillation -Currently rate controlled, continue metoprolol -Will resume Xarelto today  Essential hypertension -Continue amlodipine, metoprolol  COPD -Stable, continue bronchodilators  OSA -Continue CPAP nightly  Moderate malnutrition -Continue supplements  DVT Prophylaxis Xarelto  Code Status: Full  Family Communication: None at bedside  Disposition Plan: Admitted.  Suspect home with home health in 1 to 2 days  Consultants Vascular surgery Cardiology Infectious disease  Procedures  TEE Endovascular revascularization right lower extremity Right great toe amputation  Antibiotics   Anti-infectives (From admission, onward)   Start     Dose/Rate Route Frequency Ordered Stop   09/08/18 1600  vancomycin (VANCOCIN) 1,250 mg in sodium chloride 0.9 % 250 mL IVPB     1,250 mg 166.7 mL/hr over 90 Minutes Intravenous Every 24 hours 09/07/18 1244     09/07/18 1600  vancomycin (VANCOCIN) 2,000 mg in sodium chloride 0.9 % 500 mL IVPB     2,000 mg 250 mL/hr over 120 Minutes Intravenous  Once 09/07/18 1244 09/07/18 1952   09/06/18 1700  meropenem (MERREM) 1 g in sodium chloride 0.9 % 100 mL IVPB  Status:  Discontinued     1 g 200 mL/hr over 30 Minutes Intravenous Every 8 hours 09/06/18 1246 09/07/18 1233   09/05/18 2000  DAPTOmycin (CUBICIN) 650 mg in sodium chloride 0.9 % IVPB  Status:  Discontinued     650 mg 226 mL/hr over 30 Minutes Intravenous Every 48 hours 09/04/18 0310 09/04/18 1531   09/05/18 2000  DAPTOmycin (CUBICIN) 650 mg in sodium chloride 0.9 % IVPB  Status:  Discontinued     650 mg 226 mL/hr over 30 Minutes Intravenous Daily 09/04/18 1531 09/07/18 1233   09/04/18 0800  DAPTOmycin (CUBICIN) 650 mg in sodium chloride 0.9 %  IVPB     650 mg 226 mL/hr over 30 Minutes Intravenous  Once 09/04/18 0310 09/04/18 1521   09/03/18 0914  vancomycin variable dose per unstable renal function (pharmacist dosing)  Status:  Discontinued      Does not  apply See admin instructions 09/03/18 0915 09/04/18 0310   09/02/18 1000  meropenem (MERREM) 1 g in sodium chloride 0.9 % 100 mL IVPB  Status:  Discontinued     1 g 200 mL/hr over 30 Minutes Intravenous Every 12 hours 09/02/18 0017 09/06/18 1246   09/02/18 0030  vancomycin (VANCOCIN) 1,750 mg in sodium chloride 0.9 % 500 mL IVPB  Status:  Discontinued     1,750 mg 250 mL/hr over 120 Minutes Intravenous Every 48 hours 09/02/18 0017 09/03/18 0915   09/01/18 2300  vancomycin (VANCOCIN) 2,000 mg in sodium chloride 0.9 % 500 mL IVPB  Status:  Discontinued     2,000 mg 250 mL/hr over 120 Minutes Intravenous  Once 09/01/18 2215 09/01/18 2353   09/01/18 2230  meropenem (MERREM) 2 g in sodium chloride 0.9 % 100 mL IVPB     2 g 200 mL/hr over 30 Minutes Intravenous  Once 09/01/18 2215 09/01/18 2346      Subjective:   Jon [name] seen and examined today.  Has no complaints today.  Wanting to go home.  Denies current chest pain, shortness breath, abdominal pain, nausea or vomiting, diarrhea or constipation, dizziness or headache.  Objective:   Vitals:   09/09/18 2044 09/10/18 0420 09/10/18 0829 09/10/18 0928  BP: (!) 150/78 (!) 155/63 (!) 157/74   Pulse: 86 (!) 45    Resp: 15 (!) 23    Temp: 98.8 F (37.1 C) 97.9 F (36.6 C) 98.4 F (36.9 C)   TempSrc: Oral Axillary    SpO2: 95% 90%  98%  Weight:      Height:        Intake/Output Summary (Last 24 hours) at 09/10/2018 1052 Last data filed at 09/10/2018 0830 Gross per 24 hour  Intake 745.32 ml  Output 800 ml  Net -54.68 ml   Filed Weights   09/01/18 1944  Weight: 106.6 kg    Exam  General: Well developed, well nourished, NAD, appears stated age  HEENT: NCAT, mucous membranes moist.   Neck: Supple  Cardiovascular: S1 S2 auscultated, RRR, no murmur  Respiratory: Clear to auscultation bilaterally with equal chest rise  Abdomen: Soft, nontender, nondistended, + bowel sounds  Extremities: warm dry without cyanosis  clubbing.  Right lower extremity edema.  Right great toe amputation.  Left great toe tip ulceration.  Neuro: AAOx3, nonfocal  Psych: Normal affect and demeanor with intact judgement and insight   Data Reviewed: I have personally reviewed following labs and imaging studies  CBC: Recent Labs  Lab 09/06/18 0337 09/07/18 0532 09/08/18 0356 09/09/18 0500 09/10/18 0515  WBC 10.0 10.4 14.1* 11.9* 10.1  HGB 12.6* 12.5* 12.5* 11.1* 10.3*  HCT 37.5* 37.4* 38.6* 33.8* 32.6*  MCV 86.0 87.6 87.1 88.7 89.6  PLT 317 317 329 309 509   Basic Metabolic Panel: Recent Labs  Lab 09/05/18 0249 09/06/18 0337 09/08/18 1228 09/09/18 0500 09/10/18 0515  NA 136 139 139 141 139  K 3.9 3.3* 3.7 3.9 3.7  CL 104 106 109 110 109  CO2 22 24 23 24 23   GLUCOSE 151* 146* 284* 172* 138*  BUN 34* 26* 19 18 17   CREATININE 1.79* 1.30* 1.17 1.17 1.27*  CALCIUM 8.6* 8.5* 8.9 8.3* 8.1*  GFR: Estimated Creatinine Clearance: 63.9 mL/min (A) (by C-G formula based on SCr of 1.27 mg/dL (H)). Liver Function Tests: No results for input(s): AST, ALT, ALKPHOS, BILITOT, PROT, ALBUMIN in the last 168 hours. No results for input(s): LIPASE, AMYLASE in the last 168 hours. No results for input(s): AMMONIA in the last 168 hours. Coagulation Profile: No results for input(s): INR, PROTIME in the last 168 hours. Cardiac Enzymes: No results for input(s): CKTOTAL, CKMB, CKMBINDEX, TROPONINI in the last 168 hours. BNP (last 3 results) No results for input(s): PROBNP in the last 8760 hours. HbA1C: No results for input(s): HGBA1C in the last 72 hours. CBG: Recent Labs  Lab 09/09/18 1413 09/09/18 1512 09/09/18 1703 09/09/18 2209 09/10/18 0642  GLUCAP 141* 142* 141* 128* 141*   Lipid Profile: No results for input(s): CHOL, HDL, LDLCALC, TRIG, CHOLHDL, LDLDIRECT in the last 72 hours. Thyroid Function Tests: No results for input(s): TSH, T4TOTAL, FREET4, T3FREE, THYROIDAB in the last 72 hours. Anemia Panel: No  results for input(s): VITAMINB12, FOLATE, FERRITIN, TIBC, IRON, RETICCTPCT in the last 72 hours. Urine analysis:    Component Value Date/Time   COLORURINE YELLOW 09/03/2018 1555   APPEARANCEUR HAZY (A) 09/03/2018 1555   LABSPEC 1.011 09/03/2018 1555   PHURINE 5.0 09/03/2018 1555   GLUCOSEU NEGATIVE 09/03/2018 1555   HGBUR SMALL (A) 09/03/2018 1555   BILIRUBINUR NEGATIVE 09/03/2018 1555   KETONESUR NEGATIVE 09/03/2018 1555   PROTEINUR NEGATIVE 09/03/2018 1555   UROBILINOGEN 0.2 01/30/2014 2134   NITRITE NEGATIVE 09/03/2018 1555   LEUKOCYTESUR NEGATIVE 09/03/2018 1555   Sepsis Labs: @LABRCNTIP (procalcitonin:4,lacticidven:4)  ) Recent Results (from the past 240 hour(s))  Culture, blood (Routine X 2) w Reflex to ID Panel     Status: None   Collection Time: 09/02/18  2:24 PM  Result Value Ref Range Status   Specimen Description BLOOD BLOOD LEFT HAND  Final   Special Requests AEROBIC BOTTLE ONLY Blood Culture adequate volume  Final   Culture   Final    NO GROWTH 5 DAYS Performed at [facility], [place] [address] [place], [place] [address]    Report Status 09/07/2018 FINAL  Final  Culture, blood (Routine X 2) w Reflex to ID Panel     Status: None   Collection Time: 09/02/18  2:24 PM  Result Value Ref Range Status   Specimen Description BLOOD BLOOD LEFT HAND  Final   Special Requests AEROBIC BOTTLE ONLY Blood Culture adequate volume  Final   Culture   Final    NO GROWTH 5 DAYS Performed at [facility], [place] [address] [place], [place] [address]    Report Status 09/07/2018 FINAL  Final  Surgical PCR screen     Status: None   Collection Time: 09/08/18  9:34 PM  Result Value Ref Range Status   MRSA, PCR NEGATIVE NEGATIVE Final   Staphylococcus aureus NEGATIVE NEGATIVE Final    Comment: (NOTE) The Xpert SA Assay (FDA approved for NASAL specimens in patients 35 years of age and older), is one component of a comprehensive surveillance program. It is not intended to  diagnose infection nor to guide or monitor treatment. Performed at [facility], [place] [address], [place],  [address]       Radiology Studies: No results found.   Scheduled Meds: . amLODipine  5 mg Oral Daily  . atorvastatin  40 mg Oral q1800  . clopidogrel  75 mg Oral Q breakfast  . feeding supplement (ENSURE ENLIVE)  237 mL Oral BID BM  . feeding supplement (PRO-STAT SUGAR FREE 64)  30 mL Oral BID  . insulin aspart  0-9 Units Subcutaneous TID WC  . metoprolol succinate  25 mg Oral Daily  . mometasone-formoterol  2 puff Inhalation BID  . multivitamin with minerals  1 tablet Oral Daily  . mupirocin ointment  1 application Nasal BID  . sodium chloride flush  10-40 mL Intracatheter Q12H  . sodium chloride flush  3 mL Intravenous Q12H   Continuous Infusions: . sodium chloride    . heparin 2,050 Units/hr (09/10/18 0849)  . vancomycin Stopped (09/09/18 1845)     LOS: 9 days   Time Spent in minutes   30 minutes  Jon [name] D.O. on 09/10/2018 at 10:52 AM  Between 7am to 7pm - Please see pager noted on [email or web address]  After 7pm go to [email or web address]  And look for the night coverage person covering for me after hours  [facility] Office  [phone]

## 2018-09-10 NOTE — Progress Notes (Signed)
ANTICOAGULATION CONSULT NOTE -  Pharmacy Consult for Rivaroxaban Indication: atrial fibrillation  Allergies  Allergen Reactions  . Hydrocodone Itching  . Morphine Itching  . Duloxetine Hcl Other (See Comments)  . Codeine Itching  . Penicillins Itching and Rash    DID THE REACTION INVOLVE: Swelling of the face/tongue/throat, SOB, or low BP? No Sudden or severe rash/hives, skin peeling, or the inside of the mouth or nose? No Did it require medical treatment? No When did it last happen?unknown If all above answers are "NO", may proceed with cephalosporin use.     Patient Measurements: Height: 5\' 7"  (170.2 cm) Weight: 235 lb (106.6 kg) IBW/kg (Calculated) : 66.1  Vital Signs: Temp: 98.4 F (36.9 C) (01/10 0829) Temp Source: Axillary (01/10 0420) BP: 157/74 (01/10 0829) Pulse Rate: 45 (01/10 0420)  Labs: Recent Labs    09/08/18 0356 09/08/18 1228 09/09/18 0500 09/09/18 0612 09/10/18 0515  HGB 12.5*  --  11.1*  --  10.3*  HCT 38.6*  --  33.8*  --  32.6*  PLT 329  --  309  --  284  HEPARINUNFRC 0.17*  --   --  0.21* 0.27*  CREATININE  --  1.17 1.17  --  1.27*    Estimated Creatinine Clearance: 63.9 mL/min (A) (by C-G formula based on SCr of 1.27 mg/dL (H)).   Medical History: Past Medical History:  Diagnosis Date  . Abnormal EKG 11/29/2014  . Acute renal insufficiency 11/29/2014  . Angina   . Arthritis    "knees" (01/18/2014)  . Asthma   . Atrial fibrillation ([place])   . Back pain 01/25/2015  . 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 Chronic anticoagulation-Xarelto 02/09/2014  . Chronic atrial fibrillation 01/03/2014  . Chronic bronchitis ([place]) 12/21/2013  . Chronic diastolic heart failure ([place]) 02/22/2015  . Chronic pain of both knees 03/18/2017   Overview:  Added automatically from request for surgery [id number]  . Chronic respiratory failure  ([place]) 04/10/2015  . COPD (chronic obstructive pulmonary disease) ([place]) 01/25/2015  . Cough    coughing up blood- started last nite, seems to be worse now  . CVA (cerebral vascular accident) ([place]) 02/09/2014  . Dysrhythmia    atrial fib takes cardizem,   . Fibromyalgia   . GERD (gastroesophageal reflux disease)   . H/O hiatal hernia   . Hemoptysis 08/01/2015  . Herpes zoster 06/26/2015  . History of stroke   . Hyperkalemia-repeat pending 11/29/2014  . Hyperlipemia   . Hyperlipidemia   . Hypertension   . Hypothyroidism   . IDDM (insulin dependent diabetes mellitus) (H[facility])    insulin pump   . Long term (current) use of anticoagulants 03/21/2014  . Mild CAD 02/20/2015   Overview:  On recent cardiac cath  Overview:  On recent cardiac cath  . Myocardial infarction [facility])    "one dr says yes; another says no"  . Neuromuscular disorder ([facility])    rls, neuropathy  . Neuropathy    rls, neuropathy   . Normal coronary arteries 2011 11/29/2014  . Obesity (BMI 30-39.9)   . On home oxygen therapy    "2L w/CPAP at bedtime" (09/01/2018)  . OSA on CPAP    cpap & oxygen  . Pericardial effusion   . Peritoneal effusion, chronic 02/22/2015   Overview:  With surgical window  . PNA (pneumonia) 04/10/2015  . Pneumonia 5/15   "several times" (01/18/2014)  .  Precordial pain 01/26/2015  . Preoperative cardiovascular examination 02/24/2016  . Primary osteoarthritis of both knees 03/18/2017   Overview:  Added automatically from request for surgery [id number]  . Restless leg syndrome   . Sleep apnea    cpap & oxygen   . Stroke [facility]) 2012   denies residual on 01/18/2014  . Troponin level elevated 11/29/2014  . Uncontrolled type 2 diabetes mellitus with diabetic polyneuropathy, with long-term current use of insulin ([place]) 12/21/2013    Assessment: 68 y.o.malewithatrial fibrillation on Xarelto PTA (home dose 15 mg po daily after supper).  Pharmacy consulted to restart Xarelto for nonvalvular atrial  fibrillation.  Plan:  Restart Xarelto 15 mg po daily after supper. Monitor for signs and symptoms of bleeding.  Alanda Slim, PharmD, [facility] Clinical Pharmacist Please see AMION for all Pharmacists' Contact Phone Numbers 09/10/2018, 11:28 AM

## 2018-09-10 NOTE — Progress Notes (Addendum)
  Progress Note    09/10/2018 7:48 AM 1 Day Post-Op  Subjective: soreness R GT amp site.  Wants to go home.   Vitals:   09/09/18 2044 09/10/18 0420  BP: (!) 150/78 (!) 155/63  Pulse: 86 (!) 45  Resp: 15 (!) 23  Temp: 98.8 F (37.1 C) 97.9 F (36.6 C)  SpO2: 95% 90%   Physical Exam: Lungs:  Non labored Incisions:  R GT ray amp dry, skin edges viable; no active bleeding Extremities:  R PT brisk by doppler Neurologic: A&O  CBC    Component Value Date/Time   WBC 10.1 09/10/2018 0515   RBC 3.64 (L) 09/10/2018 0515   HGB 10.3 (L) 09/10/2018 0515   HCT 32.6 (L) 09/10/2018 0515   PLT 284 09/10/2018 0515   MCV 89.6 09/10/2018 0515   MCH 28.3 09/10/2018 0515   MCHC 31.6 09/10/2018 0515   RDW 15.1 09/10/2018 0515   LYMPHSABS 1.1 09/01/2018 2157   MONOABS 0.8 09/01/2018 2157   EOSABS 0.0 09/01/2018 2157   BASOSABS 0.0 09/01/2018 2157    BMET    Component Value Date/Time   NA 139 09/10/2018 0515   K 3.7 09/10/2018 0515   CL 109 09/10/2018 0515   CO2 23 09/10/2018 0515   GLUCOSE 138 (H) 09/10/2018 0515   BUN 17 09/10/2018 0515   CREATININE 1.27 (H) 09/10/2018 0515   CALCIUM 8.1 (L) 09/10/2018 0515   GFRNONAA 57 (L) 09/10/2018 0515   GFRAA >60 09/10/2018 0515    INR    Component Value Date/Time   INR 1.68 09/01/2018 2157     Intake/Output Summary (Last 24 hours) at 09/10/2018 0748 Last data filed at 09/10/2018 0421 Gross per 24 hour  Intake 745.32 ml  Output 550 ml  Net 195.32 ml     Assessment/Plan:  68 y.o. male is s/p TP atherectomy, angioplasty PT, and subsequent R GT ray amp 1 Day Post-Op   Perfusing R foot well by doppler Incision unremarkable Encouraged ambulation with heel shoe PT recommending HHPT Ok for discharge from vascular standpoint after transition from heparin back to Xarelto; this will be in addition to plavix Office will call pt to arrange follow up with our office  Dagoberto Ligas, PA-C [facility] [phone] 09/10/2018 7:48 AM   I have independently interviewed and examined patient and agree with PA assessment and plan above.  He will follow-up in a few weeks for wound check.  Endovascular revascularization by Dr. Carlis Abbott working well with palpable distal anterior tibial pulse.  We will need to perform left lower extremity angiogram to evaluate his tibial arteries for possible intervention as he has toe tip ulceration on the left great toe as well.  He is okay for discharge from a vascular standpoint.  Okay for Xarelto and Plavix and would hold aspirin at this time.  Joren Rehm C. Donzetta Matters, MD [facility] [place] Office: [phone] Pager: [phone]

## 2018-09-11 LAB — HEMOGLOBIN AND HEMATOCRIT, BLOOD
HCT: 34.9 % — ABNORMAL LOW (ref 39.0–52.0)
Hemoglobin: 11 g/dL — ABNORMAL LOW (ref 13.0–17.0)

## 2018-09-11 LAB — BASIC METABOLIC PANEL
Anion gap: 6 (ref 5–15)
BUN: 16 mg/dL (ref 8–23)
CO2: 23 mmol/L (ref 22–32)
Calcium: 8.4 mg/dL — ABNORMAL LOW (ref 8.9–10.3)
Chloride: 110 mmol/L (ref 98–111)
Creatinine, Ser: 1.21 mg/dL (ref 0.61–1.24)
GFR calc Af Amer: >60 mL/min (ref >60)
GFR calc non Af Amer: >60 mL/min (ref >60)
Glucose, Bld: 148 mg/dL — ABNORMAL HIGH (ref 70–99)
Potassium: 3.7 mmol/L (ref 3.5–5.1)
Sodium: 139 mmol/L (ref 135–145)

## 2018-09-11 LAB — GLUCOSE, CAPILLARY
GLUCOSE-CAPILLARY: 142 mg/dL — AB (ref 70–99)
Glucose-Capillary: 136 mg/dL — ABNORMAL HIGH (ref 70–99)

## 2018-09-11 MED ORDER — HEPARIN SOD (PORK) LOCK FLUSH 100 UNIT/ML IV SOLN
250.0000 [IU] | INTRAVENOUS | Status: AC | PRN
  Administered 2018-09-11: 250 [IU]

## 2018-09-11 MED ORDER — CLOPIDOGREL BISULFATE 75 MG PO TABS: 75.0000 mg | ORAL_TABLET | Freq: Every day | ORAL | 0 refills | Status: DC

## 2018-09-11 MED ORDER — PRO-STAT SUGAR FREE PO LIQD: 30.0000 mL | Freq: Two times a day (BID) | ORAL | 0 refills | Status: DC

## 2018-09-11 MED ORDER — MUPIROCIN 2 % EX OINT: 1.0000 "application " | TOPICAL_OINTMENT | Freq: Two times a day (BID) | CUTANEOUS | 0 refills | Status: AC

## 2018-09-11 MED ORDER — ENSURE ENLIVE PO LIQD: 237.0000 mL | Freq: Two times a day (BID) | ORAL | 12 refills | Status: DC

## 2018-09-11 MED ORDER — ADULT MULTIVITAMIN W/MINERALS CH: 1.0000 | ORAL_TABLET | Freq: Every day | ORAL | 0 refills | Status: DC

## 2018-09-11 MED ORDER — VANCOMYCIN IV (FOR PTA / DISCHARGE USE ONLY): 1250.0000 mg | INTRAVENOUS | 0 refills | Status: AC

## 2018-09-11 NOTE — Progress Notes (Signed)
[name] to be D/C'd Home per MD order. Discussed with the patient and all questions fully answered.    IV catheter discontinued intact. Site without signs and symptoms of complications. Dressing and pressure applied.  An After Visit Summary was printed and given to the patient.  Patient escorted via [place], and D/C home via private auto.  [place] Numbers  09/11/2018 3:52 PM

## 2018-09-11 NOTE — Discharge Summary (Addendum)
Physician Discharge Summary  Jon [name] AVW:[id number] DOB: 1951/01/30 DOA: 09/01/2018  PCP: Jon Brome, MD  Admit date: 09/01/2018 Discharge date: 09/11/2018  Time spent: 45 minutes  Recommendations for Outpatient Follow-up:  Patient will be discharged to home with home health physical therapy and nursing.  Patient will need to follow up with primary care provider within one week of discharge. Follow up with vascular surgery.  Follow up with Jon [name], infectious disease. Patient should continue medications as prescribed.  Patient should follow a heart healthy/ carb modified diet.   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 Cassell Clement Wrightis a 67 y.o.malewithhistory of diabetes mellitus type 2, atrial fibrillation, hypertension, COPD, sleep apnea has been experiencing increasing pain and swelling in his right foot over the last 1 month. Over the last few days he noticed increasing discoloration of his great toe and has been admitted at [facility] for cellulitis and developing gangrene of his right great toe. Patient has had some subjective feeling of fever and chills.  Hospital Course:  MRSA bacteremia -Patient is clinically improved -TEE showed no evidence of vegetation -Infectious disease consulted and appreciated, recommending vancomycin with a stop date of 09/19/2018 -PICC line placed on 09/07/2018 -Repeat blood cultures from 09/02/2018- negative to date -home health arranged  Right gangrenous great toe with critical limb  ischemia -Vascular surgery consulted and appreciated -Status post angiogram 09/08/2018, angioplasty of right anterior and right posterior tibial artery. 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 -Aspirin discontinued, continue Plavix given recent intervention  Acute kidney injury -Thought to have been due to ATN in the setting of volume depletion and dehydration -Resolved -repeat BMP in one week  Diabetes mellitus, type II -Home medications held- resume on discharge  Dyslipidemia -Continue statin  Chronic atrial fibrillation -Currently rate controlled, continue metoprolol -Continue xarelto  Essential hypertension -Continue metoprolol as well as other home medications on discharge -Patient on multiple medications including Zaroxolyn as well as torsemide, losartan, hydralazine.  Patient may not necessarily need all of these medications for blood pressure control, urged him to follow-up with PCP to discuss regimen.  COPD -Stable, continue bronchodilators  OSA -Continue CPAP nightly  Moderate malnutrition -Continue supplements  Deconditioning -PT recommended [facility]  Consultants Vascular surgery Cardiology Infectious disease  Procedures  TEE Endovascular revascularization right lower extremity Right great toe amputation  Discharge Exam: Vitals:   09/11/18 0622 09/11/18 0812  BP:    Pulse:    Resp:    Temp:    SpO2: 94% 99%     General: Well developed, well nourished, NAD, appears stated age  HEENT: NCAT, mucous membranes moist.  Neck: Supple  Cardiovascular: S1 S2 auscultated, RRR, no murmur  Respiratory: Clear to auscultation bilaterally with equal chest rise  Abdomen: Soft, nontender, nondistended, + bowel sounds  Extremities: warm dry without cyanosis clubbing. Trace LE edema. R great toe amputation with dressing in place. Left great  toe tip ulceration  Neuro: AAOx3, nonfocal.   Psych: Normal affect and demeanor, pleasant   Discharge Instructions Discharge Instructions  Discharge instructions   Complete by:  As directed    Patient will be discharged to home with home health physical therapy and nursing.  Patient will need to follow up with primary care provider within one week of discharge, repeat BMP. Follow up with vascular surgery.  Follow up with Jon [name], infectious disease. Patient should continue medications as prescribed.  Patient should follow a heart healthy/ carb modified diet.  Discuss blood pressure regimen with PCP.   Home infusion instructions [facility] May follow [place] Dosing Protocol; May administer Cathflo as needed to maintain patency of vascular access device.; Flushing of vascular access device: per [facility] Protocol: 0.9% NaCl pre/post medica...   Complete by:  As directed    Instructions:  May follow [place] Dosing Protocol   Instructions:  May administer Cathflo as needed to maintain patency of vascular access device.   Instructions:  Flushing of vascular access device: per [facility] Protocol: 0.9% NaCl pre/post medication administration and prn patency; Heparin 100 u/ml, 57m for implanted ports and Heparin 10u/ml, 585mfor all other central venous catheters.   Instructions:  May follow [facility] Anaphylaxis Protocol for First Dose Administration in the home: 0.9% NaCl at 25-50 ml/hr to maintain IV access for protocol meds. Epinephrine 0.3 ml IV/IM PRN and Benadryl 25-50 IV/IM PRN s/s of anaphylaxis.   Instructions:  [facility]fusion Coordinator (RN) to assist per patient IV care needs in the home PRN.     Allergies as of 09/11/2018      Reactions   Hydrocodone Itching   Morphine Itching   Duloxetine Hcl Other (See Comments)   Codeine Itching   Penicillins Itching, Rash   DID THE REACTION INVOLVE: Swelling of the face/tongue/throat, SOB, or low BP? No Sudden or severe  rash/hives, skin peeling, or the inside of the mouth or nose? No Did it require medical treatment? No When did it last happen?unknown If all above answers are "NO", may proceed with cephalosporin use.      Medication List    STOP taking these medications   famotidine 20 MG tablet Commonly known as:  PEPCID     TAKE these medications   albuterol 108 (90 Base) MCG/ACT inhaler Commonly known as:  PROVENTIL HFA;VENTOLIN HFA Inhale 2 puffs into the lungs every 6 (six) hours as needed for wheezing or shortness of breath.   albuterol (2.5 MG/3ML) 0.083% nebulizer solution Commonly known as:  PROVENTIL Take 3 mLs (2.5 mg total) by nebulization 2 (two) times daily. And as needed   atorvastatin 40 MG tablet Commonly known as:  LIPITOR Take 40 mg by mouth daily.   azelastine 0.1 % nasal spray Commonly known as:  ASTELIN Place 1 spray into both nostrils at bedtime as needed for allergies.   clopidogrel 75 MG tablet Commonly known as:  PLAVIX Take 1 tablet (75 mg total) by mouth daily with breakfast.   diclofenac 50 MG EC tablet Commonly known as:  VOLTAREN Take 50 mg by mouth 2 (two) times daily.   diltiazem 120 MG tablet Commonly known as:  CARDIZEM Take 120 mg by mouth daily.   feeding supplement (ENSURE ENLIVE) Liqd Take 237 mLs by mouth 2 (two) times daily between meals.   feeding supplement (PRO-STAT SUGAR FREE 64) Liqd Take 30 mLs by mouth 2 (two) times daily.   ferrous sulfate 325 (65 FE) MG tablet Take 325 mg by mouth daily.   fluticasone 50 MCG/ACT nasal spray Commonly known as:  FLONASE Place 1  spray into both nostrils daily as needed for allergies.   Fluticasone-Salmeterol 500-50 MCG/DOSE Aepb Commonly known as:  ADVAIR Inhale 1 puff into the lungs every 12 (twelve) hours.   hydrALAZINE 100 MG tablet Commonly known as:  APRESOLINE Take 100 mg by mouth at bedtime.   hydrALAZINE 50 MG tablet Commonly known as:  APRESOLINE Take 50 mg by mouth every  morning.   isosorbide mononitrate 60 MG 24 hr tablet Commonly known as:  IMDUR Take 60 mg by mouth every morning.   losartan 100 MG tablet Commonly known as:  COZAAR Take 100 mg by mouth daily.   metFORMIN 1000 MG tablet Commonly known as:  GLUCOPHAGE Take 1,000 mg by mouth 2 (two) times daily with a meal.   metolazone 2.5 MG tablet Commonly known as:  ZAROXOLYN Take 1 tablet (2.5 mg total) by mouth 2 (two) times a week. What changed:  when to take this   metoprolol succinate 25 MG 24 hr tablet Commonly known as:  TOPROL-XL Take 25 mg by mouth daily.   MOVANTIK 25 MG Tabs tablet Generic drug:  naloxegol oxalate Take 25 mg by mouth daily.   multivitamin with minerals Tabs tablet Take 1 tablet by mouth daily.   mupirocin ointment 2 % Commonly known as:  BACTROBAN Place 1 application into the nose 2 (two) times daily for 2 days.   nitroGLYCERIN 0.4 MG SL tablet Commonly known as:  NITROSTAT Place 0.4 mg under the tongue every 5 (five) minutes as needed for chest pain.   oxyCODONE ER 18 MG C12a Take 18 mg by mouth every 12 (twelve) hours.   OXYGEN Inhale into the lungs. 2 liters at bedtime   pantoprazole 40 MG tablet Commonly known as:  PROTONIX Take 40 mg by mouth daily.   Potassium Chloride ER 20 MEQ Tbcr Take 20 mEq by mouth 3 (three) times daily.   pramipexole 1.5 MG tablet Commonly known as:  MIRAPEX Take 1.5 mg by mouth at bedtime.   pregabalin 75 MG capsule Commonly known as:  LYRICA Take 1 capsule (75 mg total) by mouth 2 (two) times daily.   ranolazine 500 MG 12 hr tablet Commonly known as:  RANEXA Take 500 mg by mouth 2 (two) times daily.   SYSTANE PRESERVATIVE FREE 0.4-0.3 % Soln Generic drug:  Polyethyl Glycol-Propyl Glycol Apply 1 drop to eye 4 (four) times daily as needed (dryness of eye).   torsemide 20 MG tablet Commonly known as:  DEMADEX Take 20 mg by mouth 2 (two) times daily.   vancomycin  IVPB Inject 1,250 mg into the vein daily  for 12 days. Indication:  MRSA bacteremia  Last Day of Therapy:  09/19/2018 Labs - Sunday/Monday:  CBC/D, BMP, and vancomycin trough. Labs - Thursday:  BMP and vancomycin trough Labs - Every other week:  ESR and CRP   XARELTO 15 MG Tabs tablet Generic drug:  Rivaroxaban TAKE 1 TABLET BY MOUTH ONCE DAILY WITH EVENING MEAL What changed:  See the new instructions.            Home Infusion Instuctions  (From admission, onward)         Start     Ordered   09/11/18 0000  Home infusion instructions [facility] May follow [place] Dosing Protocol; May administer Cathflo as needed to maintain patency of vascular access device.; Flushing of vascular access device: per [facility] Protocol: 0.9% NaCl pre/post medica...    Question Answer Comment  Instructions May follow [place]  Protocol   Instructions May administer Cathflo as needed to maintain patency of vascular access device.   Instructions Flushing of vascular access device: per [facility] Protocol: 0.9% NaCl pre/post medication administration and prn patency; Heparin 100 u/ml, 11m for implanted ports and Heparin 10u/ml, 519mfor all other central venous catheters.   Instructions May follow [facility] Anaphylaxis Protocol for First Dose Administration in the home: 0.9% NaCl at 25-50 ml/hr to maintain IV access for protocol meds. Epinephrine 0.3 ml IV/IM PRN and Benadryl 25-50 IV/IM PRN s/s of anaphylaxis.   Instructions [facility] Infusion Coordinator (RN) to assist per patient IV care needs in the home PRN.      09/11/18 0926         Allergies  Allergen Reactions  . Hydrocodone Itching  . Morphine Itching  . Duloxetine Hcl Other (See Comments)  . Codeine Itching  . Penicillins Itching and Rash    DID THE REACTION INVOLVE: Swelling of the face/tongue/throat, SOB, or low BP? No Sudden or severe rash/hives, skin peeling, or the inside of the mouth or nose? No Did it require medical treatment? No When did it last  happen?unknown If all above answers are "NO", may proceed with cephalosporin use.    [facility] up.   Why:  IV home infusion therapy arranged Contact information: [address] [place] [phone]        Health, [facility]-Home Follow up.   Specialty:  Home Health Services Why:  home health services arranged Contact information: [phone]      CaWaynetta SandyMD In 3 weeks.   Specialties:  Vascular Surgery, Cardiology Contact information: [phone]        CaMichel BickersMD. Schedule an appointment as soon as possible for a visit in 3 week(s).   Specialty:  Infectious Diseases Why:  Hospital follow up Contact information: [address] [place] [phone]          The results of significant diagnostics from this hospitalization (including imaging, microbiology, ancillary and laboratory) are listed below for reference.    Significant Diagnostic Studies: [place]  Result Date: 09/03/2018 CLINICAL DATA:  Acute renal failure. EXAM: RENAL / URINARY TRACT ULTRASOUND COMPLETE COMPARISON:  Ultrasound of June 14, 2009. FINDINGS: Right Kidney: Renal measurements: 13.8 x 5.4 x 4.9 cm = volume: 192 mL . Echogenicity within normal limits. No mass or hydronephrosis visualized. Left Kidney: Renal measurements: 12.3 x 5.8 x 5.0 cm = volume: 202 mL. Echogenicity within normal limits. No mass or hydronephrosis visualized. Bladder: Decompressed secondary to Foley catheter. IMPRESSION: No renal abnormality seen. Electronically Signed   By: JaMarijo ConceptionM.D.   On: 09/03/2018 10:12   Dg Chest Port 1 View  Result Date: 09/07/2018 CLINICAL DATA:  [age]/o  M; PICC line placement. EXAM: PORTABLE CHEST 1 VIEW COMPARISON:  09/07/2018 chest radiograph FINDINGS: Right PICC  line tip projects over mid SVC. Stable cardiomegaly given projection and technique. Aortic calcific atherosclerosis. Stable ill-defined left basilar opacity. Pulmonary vascular congestion. Small left effusion. No pneumothorax. No acute osseous abnormality identified. IMPRESSION: 1. Right PICC line tip projects over mid SVC. 2. Stable cardiomegaly, pulmonary vascular congestion, left basilar opacity, and small left effusion. Electronically Signed   By: LaKristine Garbe.D.   On: 09/07/2018 17:16  Dg Chest Port 1 View  Result Date: 09/07/2018 CLINICAL DATA:  PICC line placement EXAM: PORTABLE CHEST 1 VIEW COMPARISON:  Chest x-ray of 08/03/2017 FINDINGS: The tip of the right PICC line crosses the midline into the left innominate vein. This PICC line could be pulled back by approximately 3.5 cm. No pneumothorax is seen. There is opacity medially at the left lung base which may represent pneumonia. Also there may be a small left pleural effusion present. The right lung appears clear. Cardiomegaly is stable. There are degenerative changes in the mid to lower thoracic spine. IMPRESSION: 1. Right-sided PICC line tip crosses the midline into the left innominate vein. 2. Parenchymal opacity medially at the left lung base suspicious for pneumonia and possible small left pleural effusion. Electronically Signed   By: Ivar Drape M.D.   On: 09/07/2018 16:38   Vas [place] Burnard Bunting With/wo Tbi  Result Date: 09/02/2018 LOWER EXTREMITY DOPPLER STUDY Indications: Peripheral artery disease.  Performing Technologist: Carlos Levering RVT  Examination Guidelines: A complete evaluation includes at minimum, Doppler waveform signals and systolic blood pressure reading at the level of bilateral brachial, anterior tibial, and posterior tibial arteries, when vessel segments are accessible. Bilateral testing is considered an integral part of a complete examination. Photoelectric Plethysmograph (PPG) waveforms and toe systolic pressure  readings are included as required and additional duplex testing as needed. Limited examinations for reoccurring indications may be performed as noted.  ABI Findings: +---------+------------------+-----+---------+--------+ Right    Rt Pressure (mmHg)IndexWaveform Comment  +---------+------------------+-----+---------+--------+ Brachial 101                    triphasic         +---------+------------------+-----+---------+--------+ PTA      152               1.50 biphasic          +---------+------------------+-----+---------+--------+ DP       104               1.03 biphasic          +---------+------------------+-----+---------+--------+ Great Toe                                Gangrene +---------+------------------+-----+---------+--------+ +---------+------------------+-----+---------+-------+ Left     Lt Pressure (mmHg)IndexWaveform Comment +---------+------------------+-----+---------+-------+ Brachial                                 IV      +---------+------------------+-----+---------+-------+ PTA      254               2.51 triphasic        +---------+------------------+-----+---------+-------+ DP       191               1.89 triphasic        +---------+------------------+-----+---------+-------+ Great Toe76                0.75                  +---------+------------------+-----+---------+-------+ +-------+-----------+-----------+------------+------------+ ABI/TBIToday's ABIToday's TBIPrevious ABIPrevious TBI +-------+-----------+-----------+------------+------------+ Right  1.5                                            +-------+-----------+-----------+------------+------------+ Left   2.51  0.75                                +-------+-----------+-----------+------------+------------+ Right ankle cuff was placed on proximal calf for patient comfort.  Summary: Right: Resting right ankle-brachial index indicates  noncompressible right lower extremity arteries. Right ankle cuff was placed on proximal calf for patient comfort. Unable to obtain toe pressure due to gangrenous toe. Left: Resting left ankle-brachial index indicates noncompressible left lower extremity arteries.The left toe-brachial index is normal.  *See table(s) above for measurements and observations.  Electronically signed by Curt Jews MD on 09/02/2018 at 5:34:17 PM.   Final    [place] Ekg Site Rite  Result Date: 09/07/2018 If Site Rite image not attached, placement could not be confirmed due to current cardiac rhythm.   Microbiology: Recent Results (from the past 240 hour(s))  Culture, blood (Routine X 2) w Reflex to ID Panel     Status: None   Collection Time: 09/02/18  2:24 PM  Result Value Ref Range Status   Specimen Description BLOOD BLOOD LEFT HAND  Final   Special Requests AEROBIC BOTTLE ONLY Blood Culture adequate volume  Final   Culture   Final    NO GROWTH 5 DAYS Performed at [facility], [address], [place], [place] [address]    Report Status 09/07/2018 FINAL  Final  Culture, blood (Routine X 2) w Reflex to ID Panel     Status: None   Collection Time: 09/02/18  2:24 PM  Result Value Ref Range Status   Specimen Description BLOOD BLOOD LEFT HAND  Final   Special Requests AEROBIC BOTTLE ONLY Blood Culture adequate volume  Final   Culture   Final    NO GROWTH 5 DAYS Performed at [facility], [place] [address] [place], [place] [address]    Report Status 09/07/2018 FINAL  Final  Surgical PCR screen     Status: None   Collection Time: 09/08/18  9:34 PM  Result Value Ref Range Status   MRSA, PCR NEGATIVE NEGATIVE Final   Staphylococcus aureus NEGATIVE NEGATIVE Final    Comment: (NOTE) The Xpert SA Assay (FDA approved for NASAL specimens in patients 65 years of age and older), is one component of a comprehensive surveillance program. It is not intended to diagnose infection nor to guide or monitor  treatment. Performed at [facility], [place] [address] [place],  [address]      Labs: Basic Metabolic Panel: Recent Labs  Lab 09/06/18 0337 09/08/18 1228 09/09/18 0500 09/10/18 0515 09/11/18 0545  NA 139 139 141 139 139  K 3.3* 3.7 3.9 3.7 3.7  CL 106 109 110 109 110  CO2 '24 23 24 23 23  ' GLUCOSE 146* 284* 172* 138* 148*  BUN 26* '19 18 17 16  ' CREATININE 1.30* 1.17 1.17 1.27* 1.21  CALCIUM 8.5* 8.9 8.3* 8.1* 8.4*   Liver Function Tests: No results for input(s): AST, ALT, ALKPHOS, BILITOT, PROT, ALBUMIN in the last 168 hours. No results for input(s): LIPASE, AMYLASE in the last 168 hours. No results for input(s): AMMONIA in the last 168 hours. CBC: Recent Labs  Lab 09/06/18 0337 09/07/18 0532 09/08/18 0356 09/09/18 0500 09/10/18 0515 09/11/18 0545  WBC 10.0 10.4 14.1* 11.9* 10.1  --   HGB 12.6* 12.5* 12.5* 11.1* 10.3* 11.0*  HCT 37.5* 37.4* 38.6* 33.8* 32.6* 34.9*  MCV 86.0 87.6 87.1 88.7 89.6  --  PLT 317 317 329 309 284  --    Cardiac Enzymes: No results for input(s): CKTOTAL, CKMB, CKMBINDEX, TROPONINI in the last 168 hours. BNP: BNP (last 3 results) No results for input(s): BNP in the last 8760 hours.  ProBNP (last 3 results) No results for input(s): PROBNP in the last 8760 hours.  CBG: Recent Labs  Lab 09/09/18 2209 09/10/18 0642 09/10/18 1625 09/10/18 2040 09/11/18 0620  GLUCAP 128* 141* 152* 151* 142*       Signed:  Nikeshia Keetch  [facility] 09/11/2018, 9:27 AM

## 2018-09-11 NOTE — Care Management (Signed)
Pt to d/c today with HH IV infusions and HHPT.  Pt will have Vancomycin Q24 hours, due at 4pm today.  Pt/caregiver have not been educated to give home doses and have a RN visit scheduled for tomorrow afternoon.  Pt will need to receive Vanc dose prior to leaving hospital today and can receive anytime after noon according to pharmacy.  Pt advised and very upset that he cannot leave this morning as promised.  MD and RN aware of plan.  [facility] aware of patient d/c today and will arrange delivery of medication and [facility] RN visit tomorrow.

## 2018-09-11 NOTE — Progress Notes (Signed)
Pt briefly SOB this AM, sats 86-89% RA, placed on 3L O2 (pt states he uses 2-3L PRN at home) w/ sats 100%. HR up to 140s during this episode, but quickly dropped down back to 80s. Pt states relief w/ admin of O2. Will continue to monitor.

## 2018-09-11 NOTE — Discharge Instructions (Signed)
When walking, wear Darco shoe for heal weight bearing only.    Bacteremia, Adult Bacteremia is the presence of bacteria in the blood. When bacteria enter the bloodstream, they can cause a life-threatening reaction called sepsis, which is a medical emergency. Bacteremia can spread to other parts of the body, including the heart, joints, and brain. What are the causes? This condition is caused by bacteria that get into the blood.  Bacteria can enter the blood: ? From a skin infection or injury, such as a burn or a cut. ? From a lung infection (pneumonia). ? From an infection in your stomach or intestines (gastrointestinal infection). ? From an infection in your bladder or urinary system (urinary tract infection). ? During a dental or medical procedure. ? From bleeding gums. ? When a bacterial infection in another part of your body spreads to your blood. ? Through an unclean (contaminated) needle. What increases the risk? This condition is more likely to develop in children, the elderly, and people who:  Have a long-term (chronic) disease or condition like diabetes or chronic kidney failure.  Have an artificial joint or heart valve.  Have heart valve disease.  Have a tube inserted to treat a medical condition, such as a urinary catheter or IV.  Have a weak disease-fighting system (immune system).  Inject illegal drugs.  Have been hospitalized for more than 10 days in a row. What are the signs or symptoms? Symptoms of this condition include:  Fever.  Chills.  Fast heartbeat.  Shortness of breath.  Dizziness.  Weakness.  Confusion.  Nausea or vomiting.  Diarrhea.  Low blood pressure.  Decreased urine output. Bacteremia that has spread to other parts of the body may cause symptoms in those areas. In some cases, there are no symptoms. How is this diagnosed? This condition may be diagnosed with a physical exam and tests, such as:  A complete blood count (CBC).  This test checks for signs of infection.  Blood cultures. These check for bacteria in your blood.  Tests of any tubes that you have had inserted. These tests check for a source of infection.  Urine tests, including urine cultures. These check for bacteria in the urine that could be a source of infection.  Imaging tests, such as an X-ray, CT scan, MRI, or heart ultrasound. These check for a source of infection in other parts of your body, such as your lungs, heart valves, or joints. How is this treated? This condition is usually treated in the hospital. Treatment may involve:  Antibiotic medicines. These may be given by mouth (orally) or directly into your blood through an IV (infusion through your vein). ? Depending on the source of infection, you may need antibiotics for several weeks. ? At first, you may be given an antibiotic to kill most types of blood bacteria (broad-spectrum antibiotic). If your test results show that a certain kind of bacteria is causing the problem, you may be given a different antibiotic to kill that specific bacteria.  IV fluids.  Removing any catheter or device that could be a source of infection.  Blood pressure and breathing support, if needed.  Surgery to control the source or the spread of infection, such as surgery to remove an infected device, abscess, or tissue.  Having follow-up visits for medicines, blood tests, and further evaluation. Follow these instructions at home: Medicines  Take over-the-counter and prescription medicines only as told by your health care provider.  If you were prescribed an antibiotic medicine, take  it as told by your health care provider. Do not stop taking the antibiotic even if you start to feel better. General instructions   Rest as needed. Ask your health care provider when you may return to normal activities.  Drink enough fluid to keep your urine pale yellow.  Do not use any products that contain nicotine or  tobacco, such as cigarettes and e-cigarettes. If you need help quitting, ask your health care provider.  Keep all follow-up visits as told by your health care provider. This is important. How is this prevented?   Wash your hands regularly with soap and water. If soap and water are not available, use hand sanitizer.  You should wash your hands: ? After using the toilet or changing a diaper. ? Before preparing, cooking, or serving food. ? While caring for a sick person or while visiting someone in a hospital. ? Before and after changing bandages (dressings) over wounds.  Clean any scrapes or cuts with soap and water and cover them with clean dressings.  Get vaccinations as recommended by your health care provider.  Practice good oral hygiene. Brush your teeth two times a day, and floss regularly.  Take good care of your skin. This includes bathing and moisturizing on a regular basis. Get help right away if you have:  Pain.  A fever or chills.  Trouble breathing.  A fast heart rate.  Skin that is blotchy, pale, or clammy.  Confusion.  Weakness.  Lack of energy (lethargy) or unusual sleepiness.  Diarrhea.  New symptoms that develop after treatment has started. These symptoms may represent a serious problem that is an emergency. Do not wait to see if the symptoms will go away. Get medical help right away. Call your local emergency services (911 in the [place] Do not drive yourself to the hospital. Summary  Bacteremia is the presence of bacteria in the blood. When bacteria enter the bloodstream, they can cause a life-threatening reaction called sepsis.  Some symptoms of bacteremia include fever, chills, shortness of breath, confusion, nausea or vomiting, and diarrhea.  Tests may be done to find the source of infection that led to bacteremia. These tests may include blood tests, urine tests, and imaging tests.  Bacteremia is usually treated with antibiotic medicines in the  hospital.  Get help right away if you have any new symptoms that develop after treatment has started. This information is not intended to replace advice given to you by your health care provider. Make sure you discuss any questions you have with your health care provider. Document Released: 06/01/2006 Document Revised: 12/28/2017 Document Reviewed: 12/28/2017 Elsevier Interactive Patient Education  2019 [employer].

## 2018-09-11 NOTE — Progress Notes (Signed)
  Progress Note    09/11/2018 9:05 AM   Subjective: No complaints.   Vitals:   09/11/18 0622 09/11/18 0812  BP:    Pulse:    Resp:    Temp:    SpO2: 94% 99%   Physical Exam: Lungs:  Non labored Incisions:  R GT ray amp c/d/i sutures Extremities:  R AT palpable   CBC    Component Value Date/Time   WBC 10.1 09/10/2018 0515   RBC 3.64 (L) 09/10/2018 0515   HGB 11.0 (L) 09/11/2018 0545   HCT 34.9 (L) 09/11/2018 0545   PLT 284 09/10/2018 0515   MCV 89.6 09/10/2018 0515   MCH 28.3 09/10/2018 0515   MCHC 31.6 09/10/2018 0515   RDW 15.1 09/10/2018 0515   LYMPHSABS 1.1 09/01/2018 2157   MONOABS 0.8 09/01/2018 2157   EOSABS 0.0 09/01/2018 2157   BASOSABS 0.0 09/01/2018 2157    BMET    Component Value Date/Time   NA 139 09/11/2018 0545   K 3.7 09/11/2018 0545   CL 110 09/11/2018 0545   CO2 23 09/11/2018 0545   GLUCOSE 148 (H) 09/11/2018 0545   BUN 16 09/11/2018 0545   CREATININE 1.21 09/11/2018 0545   CALCIUM 8.4 (L) 09/11/2018 0545   GFRNONAA >60 09/11/2018 0545   GFRAA >60 09/11/2018 0545    INR    Component Value Date/Time   INR 1.68 09/01/2018 2157     Intake/Output Summary (Last 24 hours) at 09/11/2018 0905 Last data filed at 09/11/2018 0300 Gross per 24 hour  Intake 550 ml  Output 700 ml  Net -150 ml     Assessment/Plan:  68 y.o. male is s/p AT atherectomy, angioplasty PT, and subsequent R GT ray amp  Dressing changed to right GT amp this am and looks ok.  Palpable R AT pulse.  Xarelto and Plavix from our standpoint and hold aspirin.  Will arrange follow-up in vascular clinic in few weeks for wound check.  Will need LLE arteriogram/intervention in near future for small ulceration.  OK for discharge from our standpoint.  Marty Heck, MD [facility] Office: [phone] Pager: [place]

## 2018-09-11 NOTE — Progress Notes (Signed)
Physical Therapy Treatment Patient Details Name: Jon [name] MRN: [id number] DOB: [birth year] Today's Date: 09/11/2018    History of Present Illness Patient is a 68 y/o male admitted on 09/01/17 from [facility] due to Right foot great toe gangrene, foot discoloration, pain. 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  Past medical history significant for diabetes mellitus type 2, atrial fibrillation, hypertension, COPD, sleep apnea.    PT Comments    Today's session focused on gait training with darco shoe and RW. Pt distance limited secondary to DOE and tachycardia. Pt with good family support at home and newly installed ramp to enter house. Expected to d/c home with HHPT to follow up.     Follow Up Recommendations  Home health PT;Supervision for mobility/OOB     Equipment Recommendations  None recommended by PT    Recommendations for Other Services       Precautions / Restrictions Precautions Precautions: Fall Precaution Comments: watch HR Restrictions Weight Bearing Restrictions: No    Mobility  Bed Mobility Overal bed mobility: Modified Independent             General bed mobility comments: incr time  Transfers Overall transfer level: Needs assistance Equipment used: Rolling walker (2 wheeled) Transfers: Sit to/from Stand Sit to Stand: Min guard         General transfer comment: min guard for safety  Ambulation/Gait Ambulation/Gait assistance: Min guard Gait Distance (Feet): 20 Feet Assistive device: Rolling walker (2 wheeled) Gait Pattern/deviations: Decreased stride length;Antalgic;Decreased stance time - right;Decreased step length - left;Step-to pattern Gait velocity: decreased   General Gait Details: Pt ambulating on heel on R foot. After ambulating 10 ft, pt with tachycardia, SOB, and afib. Unable to get SpO2 reading durnig activity. Pt returned to bed to allow HR and O2 to recover.    Stairs             Wheelchair Mobility     Modified Rankin (Stroke Patients Only)       Balance Overall balance assessment: Needs assistance Sitting-balance support: No upper extremity supported;Feet supported Sitting balance-Leahy Scale: Good     Standing balance support: Bilateral upper extremity supported;During functional activity Standing balance-Leahy Scale: Poor Standing balance comment: walker and supervision for static standing                            Cognition Arousal/Alertness: Awake/alert Behavior During Therapy: WFL for tasks assessed/performed Overall Cognitive Status: Within Functional Limits for tasks assessed                                        Exercises      General Comments General comments (skin integrity, edema, etc.): HR 158 with ambuation. SOB, but unable to get SpO2 reading.      Pertinent Vitals/Pain Pain Assessment: No/denies pain    Home Living                      Prior Function            PT Goals (current goals can now be found in the care plan section) Acute Rehab PT Goals Patient Stated Goal: return home soon PT Goal Formulation: With patient Time For Goal Achievement: 09/20/18 Potential to Achieve Goals: Good Progress towards PT goals: Progressing toward goals    Frequency    Min  3X/week      PT Plan Current plan remains appropriate    Co-evaluation              AM-PAC PT "6 Clicks" Mobility   Outcome Measure  Help needed turning from your back to your side while in a flat bed without using bedrails?: None Help needed moving from lying on your back to sitting on the side of a flat bed without using bedrails?: None Help needed moving to and from a bed to a chair (including a wheelchair)?: A Little Help needed standing up from a chair using your arms (e.g., wheelchair or bedside chair)?: A Little Help needed to walk in hospital room?: A Little Help needed climbing 3-5 steps with a railing? : A Little 6 Click  Score: 20    End of Session Equipment Utilized During Treatment: Gait belt Activity Tolerance: Treatment limited secondary to medical complications (Comment) Patient left: with call bell/phone within reach;in bed Nurse Communication: Mobility status;Other (comment)(HR and SOB) PT Visit Diagnosis: Unsteadiness on feet (R26.81);Other abnormalities of gait and mobility (R26.89);History of falling (Z91.81)     Time: 4320-0379 PT Time Calculation (min) (ACUTE ONLY): 20 min  Charges:  $Gait Training: 8-22 mins                     Benjiman Core, [place] Pager [phone] Acute Rehab    ZAEDEN LASTINGER 09/11/2018, 12:18 PM

## 2018-09-12 DIAGNOSIS — Z4781 Encounter for orthopedic aftercare following surgical amputation: Secondary | ICD-10-CM | POA: Diagnosis not present

## 2018-09-12 DIAGNOSIS — I25119 Atherosclerotic heart disease of native coronary artery with unspecified angina pectoris: Secondary | ICD-10-CM | POA: Diagnosis not present

## 2018-09-12 DIAGNOSIS — J181 Lobar pneumonia, unspecified organism: Secondary | ICD-10-CM | POA: Diagnosis not present

## 2018-09-12 DIAGNOSIS — J9621 Acute and chronic respiratory failure with hypoxia: Secondary | ICD-10-CM | POA: Diagnosis not present

## 2018-09-12 DIAGNOSIS — E1129 Type 2 diabetes mellitus with other diabetic kidney complication: Secondary | ICD-10-CM | POA: Diagnosis not present

## 2018-09-12 DIAGNOSIS — J9600 Acute respiratory failure, unspecified whether with hypoxia or hypercapnia: Secondary | ICD-10-CM | POA: Diagnosis not present

## 2018-09-12 DIAGNOSIS — A4102 Sepsis due to Methicillin resistant Staphylococcus aureus: Secondary | ICD-10-CM | POA: Diagnosis not present

## 2018-09-13 ENCOUNTER — Telehealth: Payer: Self-pay | Admitting: Vascular Surgery

## 2018-09-13 DIAGNOSIS — Z4781 Encounter for orthopedic aftercare following surgical amputation: Secondary | ICD-10-CM | POA: Diagnosis not present

## 2018-09-13 DIAGNOSIS — I25119 Atherosclerotic heart disease of native coronary artery with unspecified angina pectoris: Secondary | ICD-10-CM | POA: Diagnosis not present

## 2018-09-13 NOTE — Telephone Encounter (Signed)
sch appt lvm mld 10/08/2018 10am ABI 11am LE Art 1245pm wound check MD

## 2018-09-13 NOTE — Telephone Encounter (Signed)
-----   Message from Dagoberto Ligas, PA-C sent at 09/10/2018  7:54 AM EST -----  Can you schedule a RLE arterial duplex and ABI for this pt to be seen by Dr. Donzetta Matters in about 2-3 weeks for wound check.  PO R TP trunk atherectomy and R GT ray amp. Thanks, [facility]

## 2018-09-16 DIAGNOSIS — A4102 Sepsis due to Methicillin resistant Staphylococcus aureus: Secondary | ICD-10-CM | POA: Diagnosis not present

## 2018-09-16 DIAGNOSIS — J9621 Acute and chronic respiratory failure with hypoxia: Secondary | ICD-10-CM | POA: Diagnosis not present

## 2018-09-16 DIAGNOSIS — J9600 Acute respiratory failure, unspecified whether with hypoxia or hypercapnia: Secondary | ICD-10-CM | POA: Diagnosis not present

## 2018-09-16 DIAGNOSIS — J181 Lobar pneumonia, unspecified organism: Secondary | ICD-10-CM | POA: Diagnosis not present

## 2018-09-16 DIAGNOSIS — E1129 Type 2 diabetes mellitus with other diabetic kidney complication: Secondary | ICD-10-CM | POA: Diagnosis not present

## 2018-09-17 DIAGNOSIS — Z792 Long term (current) use of antibiotics: Secondary | ICD-10-CM | POA: Diagnosis not present

## 2018-09-17 DIAGNOSIS — Z4781 Encounter for orthopedic aftercare following surgical amputation: Secondary | ICD-10-CM | POA: Diagnosis not present

## 2018-09-17 DIAGNOSIS — I25119 Atherosclerotic heart disease of native coronary artery with unspecified angina pectoris: Secondary | ICD-10-CM | POA: Diagnosis not present

## 2018-09-20 DIAGNOSIS — E1129 Type 2 diabetes mellitus with other diabetic kidney complication: Secondary | ICD-10-CM | POA: Diagnosis not present

## 2018-09-20 DIAGNOSIS — Z4781 Encounter for orthopedic aftercare following surgical amputation: Secondary | ICD-10-CM | POA: Diagnosis not present

## 2018-09-20 DIAGNOSIS — M170 Bilateral primary osteoarthritis of knee: Secondary | ICD-10-CM | POA: Diagnosis not present

## 2018-09-20 DIAGNOSIS — R7881 Bacteremia: Secondary | ICD-10-CM | POA: Diagnosis not present

## 2018-09-20 DIAGNOSIS — R296 Repeated falls: Secondary | ICD-10-CM | POA: Diagnosis not present

## 2018-09-20 DIAGNOSIS — Z89411 Acquired absence of right great toe: Secondary | ICD-10-CM | POA: Diagnosis not present

## 2018-09-20 DIAGNOSIS — M797 Fibromyalgia: Secondary | ICD-10-CM | POA: Diagnosis not present

## 2018-09-20 DIAGNOSIS — K219 Gastro-esophageal reflux disease without esophagitis: Secondary | ICD-10-CM | POA: Diagnosis not present

## 2018-09-20 DIAGNOSIS — Z452 Encounter for adjustment and management of vascular access device: Secondary | ICD-10-CM | POA: Diagnosis not present

## 2018-09-20 DIAGNOSIS — E1142 Type 2 diabetes mellitus with diabetic polyneuropathy: Secondary | ICD-10-CM | POA: Diagnosis not present

## 2018-09-20 DIAGNOSIS — E785 Hyperlipidemia, unspecified: Secondary | ICD-10-CM | POA: Diagnosis not present

## 2018-09-20 DIAGNOSIS — J449 Chronic obstructive pulmonary disease, unspecified: Secondary | ICD-10-CM | POA: Diagnosis not present

## 2018-09-20 DIAGNOSIS — L03115 Cellulitis of right lower limb: Secondary | ICD-10-CM | POA: Diagnosis not present

## 2018-09-20 DIAGNOSIS — I482 Chronic atrial fibrillation, unspecified: Secondary | ICD-10-CM | POA: Diagnosis not present

## 2018-09-20 DIAGNOSIS — I25119 Atherosclerotic heart disease of native coronary artery with unspecified angina pectoris: Secondary | ICD-10-CM | POA: Diagnosis not present

## 2018-09-20 DIAGNOSIS — E1151 Type 2 diabetes mellitus with diabetic peripheral angiopathy without gangrene: Secondary | ICD-10-CM | POA: Diagnosis not present

## 2018-09-20 DIAGNOSIS — I5032 Chronic diastolic (congestive) heart failure: Secondary | ICD-10-CM | POA: Diagnosis not present

## 2018-09-20 DIAGNOSIS — J9690 Respiratory failure, unspecified, unspecified whether with hypoxia or hypercapnia: Secondary | ICD-10-CM | POA: Diagnosis not present

## 2018-09-20 DIAGNOSIS — I998 Other disorder of circulatory system: Secondary | ICD-10-CM | POA: Diagnosis not present

## 2018-09-20 DIAGNOSIS — I252 Old myocardial infarction: Secondary | ICD-10-CM | POA: Diagnosis not present

## 2018-09-20 DIAGNOSIS — G8929 Other chronic pain: Secondary | ICD-10-CM | POA: Diagnosis not present

## 2018-09-20 DIAGNOSIS — B9562 Methicillin resistant Staphylococcus aureus infection as the cause of diseases classified elsewhere: Secondary | ICD-10-CM | POA: Diagnosis not present

## 2018-09-20 DIAGNOSIS — I110 Hypertensive heart disease with heart failure: Secondary | ICD-10-CM | POA: Diagnosis not present

## 2018-09-23 ENCOUNTER — Other Ambulatory Visit: Payer: Self-pay

## 2018-09-23 DIAGNOSIS — I779 Disorder of arteries and arterioles, unspecified: Secondary | ICD-10-CM

## 2018-09-23 DIAGNOSIS — M797 Fibromyalgia: Secondary | ICD-10-CM | POA: Diagnosis not present

## 2018-09-23 DIAGNOSIS — E785 Hyperlipidemia, unspecified: Secondary | ICD-10-CM | POA: Diagnosis not present

## 2018-09-23 DIAGNOSIS — E1151 Type 2 diabetes mellitus with diabetic peripheral angiopathy without gangrene: Secondary | ICD-10-CM | POA: Diagnosis not present

## 2018-09-23 DIAGNOSIS — I998 Other disorder of circulatory system: Secondary | ICD-10-CM | POA: Diagnosis not present

## 2018-09-23 DIAGNOSIS — L03115 Cellulitis of right lower limb: Secondary | ICD-10-CM | POA: Diagnosis not present

## 2018-09-23 DIAGNOSIS — R7881 Bacteremia: Secondary | ICD-10-CM | POA: Diagnosis not present

## 2018-09-23 DIAGNOSIS — I482 Chronic atrial fibrillation, unspecified: Secondary | ICD-10-CM | POA: Diagnosis not present

## 2018-09-23 DIAGNOSIS — K219 Gastro-esophageal reflux disease without esophagitis: Secondary | ICD-10-CM | POA: Diagnosis not present

## 2018-09-23 DIAGNOSIS — Z4781 Encounter for orthopedic aftercare following surgical amputation: Secondary | ICD-10-CM | POA: Diagnosis not present

## 2018-09-23 DIAGNOSIS — J449 Chronic obstructive pulmonary disease, unspecified: Secondary | ICD-10-CM | POA: Diagnosis not present

## 2018-09-23 DIAGNOSIS — M170 Bilateral primary osteoarthritis of knee: Secondary | ICD-10-CM | POA: Diagnosis not present

## 2018-09-23 DIAGNOSIS — I252 Old myocardial infarction: Secondary | ICD-10-CM | POA: Diagnosis not present

## 2018-09-23 DIAGNOSIS — R296 Repeated falls: Secondary | ICD-10-CM | POA: Diagnosis not present

## 2018-09-23 DIAGNOSIS — Z89411 Acquired absence of right great toe: Secondary | ICD-10-CM | POA: Diagnosis not present

## 2018-09-23 DIAGNOSIS — E1129 Type 2 diabetes mellitus with other diabetic kidney complication: Secondary | ICD-10-CM | POA: Diagnosis not present

## 2018-09-23 DIAGNOSIS — I5032 Chronic diastolic (congestive) heart failure: Secondary | ICD-10-CM | POA: Diagnosis not present

## 2018-09-23 DIAGNOSIS — G8929 Other chronic pain: Secondary | ICD-10-CM | POA: Diagnosis not present

## 2018-09-23 DIAGNOSIS — B9562 Methicillin resistant Staphylococcus aureus infection as the cause of diseases classified elsewhere: Secondary | ICD-10-CM | POA: Diagnosis not present

## 2018-09-23 DIAGNOSIS — Z452 Encounter for adjustment and management of vascular access device: Secondary | ICD-10-CM | POA: Diagnosis not present

## 2018-09-23 DIAGNOSIS — I110 Hypertensive heart disease with heart failure: Secondary | ICD-10-CM | POA: Diagnosis not present

## 2018-09-23 DIAGNOSIS — J9690 Respiratory failure, unspecified, unspecified whether with hypoxia or hypercapnia: Secondary | ICD-10-CM | POA: Diagnosis not present

## 2018-09-23 DIAGNOSIS — E1142 Type 2 diabetes mellitus with diabetic polyneuropathy: Secondary | ICD-10-CM | POA: Diagnosis not present

## 2018-09-23 DIAGNOSIS — I25119 Atherosclerotic heart disease of native coronary artery with unspecified angina pectoris: Secondary | ICD-10-CM | POA: Diagnosis not present

## 2018-09-24 DIAGNOSIS — J9610 Chronic respiratory failure, unspecified whether with hypoxia or hypercapnia: Secondary | ICD-10-CM | POA: Diagnosis not present

## 2018-09-26 DIAGNOSIS — J9610 Chronic respiratory failure, unspecified whether with hypoxia or hypercapnia: Secondary | ICD-10-CM | POA: Diagnosis not present

## 2018-09-27 ENCOUNTER — Telehealth: Payer: Self-pay

## 2018-09-27 NOTE — Telephone Encounter (Signed)
Pt. Daughter in law [name]) called, as she has been doing pt's dressing changes. She states pt has reddish and brownish area on outer aspect of foot, no near incision. No drainage, no fever/chills. She thinks it may be due to boot rubbing that area. Wound check appt made for pt tomorrow morning. Encouraged [name] to pad the boot in that area. She has no further questions/concerns at this time.

## 2018-09-28 ENCOUNTER — Encounter: Payer: Self-pay | Admitting: Vascular Surgery

## 2018-09-28 ENCOUNTER — Ambulatory Visit (INDEPENDENT_AMBULATORY_CARE_PROVIDER_SITE_OTHER): Payer: Self-pay | Admitting: Vascular Surgery

## 2018-09-28 ENCOUNTER — Other Ambulatory Visit: Payer: Self-pay

## 2018-09-28 VITALS — BP 127/82 | HR 75 | Temp 97.9°F | Resp 20 | Ht 67.0 in | Wt 235.0 lb

## 2018-09-28 DIAGNOSIS — I739 Peripheral vascular disease, unspecified: Secondary | ICD-10-CM

## 2018-09-28 DIAGNOSIS — I96 Gangrene, not elsewhere classified: Secondary | ICD-10-CM

## 2018-09-28 HISTORY — DX: Peripheral vascular disease, unspecified: I73.9

## 2018-09-28 NOTE — Progress Notes (Signed)
Patient name: Jon [name] MRN: [id number] DOB: 1951-05-20 Sex: male  REASON FOR VISIT: Right foot wound check  HPI: Jon [name] is a 68 y.o. male with multiple medical comorbidities that recently underwent right lower extremity intervention on 09/08/18 including AT atherectomy and PT angioplasty and then transmetatarsal amputation of the great toe on 09/09/18.  He presents to clinic today as an add-on given concern for a new wound on his right lateral foot that his wife was worried about.  He reports no fevers or chills at home.  Still has a sutures from his amputation in his toe about 2 weeks ago by Dr. Donzetta Matters.  He is scheduled see Dr. Donzetta Matters next Friday for suture removal and planning left lower extremity intervention.  Past Medical History:  Diagnosis Date  . Abnormal EKG 11/29/2014  . Acute renal insufficiency 11/29/2014  . Angina   . Arthritis    "knees" (01/18/2014)  . Asthma   . Atrial fibrillation ([place])   . Back pain 01/25/2015  . 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 BOOP (bronchiolitis obliterans with organizing pneumonia) ([place]) 01/20/2014   OLBx 01/20/14 BOOP Started steroids 02/28/14   . Chronic anticoagulation-Xarelto 02/09/2014  . Chronic atrial fibrillation 01/03/2014  . Chronic bronchitis ([place]) 12/21/2013  . Chronic diastolic heart failure ([place]) 02/22/2015  . Chronic pain of both knees 03/18/2017   Overview:  Added automatically from request for surgery [id number]  . Chronic respiratory failure ([place]) 04/10/2015  . COPD (chronic obstructive pulmonary disease) ([place]) 01/25/2015  . Cough    coughing up blood- started last nite, seems to be worse now  . CVA (cerebral vascular accident) ([place]) 02/09/2014  . Dysrhythmia    atrial fib takes cardizem,   . Fibromyalgia   . GERD (gastroesophageal reflux disease)   . H/O hiatal hernia   . Hemoptysis 08/01/2015  . Herpes zoster 06/26/2015  . History of stroke   . Hyperkalemia-repeat pending 11/29/2014  . Hyperlipemia   . Hyperlipidemia   .  Hypertension   . Hypothyroidism   . IDDM (insulin dependent diabetes mellitus) (HCC)    insulin pump   . Long term (current) use of anticoagulants 03/21/2014  . Mild CAD 02/20/2015   Overview:  On recent cardiac cath  Overview:  On recent cardiac cath  . Myocardial infarction [facility])    "one dr says yes; another says no"  . Neuromuscular disorder (HCC)    rls, neuropathy  . Neuropathy    rls, neuropathy   . Normal coronary arteries 2011 11/29/2014  . Obesity (BMI 30-39.9)   . On home oxygen therapy    "2L w/CPAP at bedtime" (09/01/2018)  . OSA on CPAP    cpap & oxygen  . Pericardial effusion   . Peritoneal effusion, chronic 02/22/2015   Overview:  With surgical window  . PNA (pneumonia) 04/10/2015  . Pneumonia 5/15   "several times" (01/18/2014)  . Precordial pain 01/26/2015  . Preoperative cardiovascular examination 02/24/2016  . Primary osteoarthritis of both knees 03/18/2017   Overview:  Added automatically from request for surgery [id number]  . Restless leg syndrome   . Sleep apnea    cpap & oxygen   . Stroke [facility]) 2012   denies residual on 01/18/2014  . Troponin level elevated 11/29/2014  . Uncontrolled type 2 diabetes mellitus with diabetic polyneuropathy, with long-term current use of insulin ([place]) 12/21/2013    Past Surgical History:  Procedure Laterality Date  . ABDOMINAL AORTOGRAM W/LOWER EXTREMITY N/A 09/08/2018   Procedure:  ABDOMINAL AORTOGRAM W/LOWER EXTREMITY;  Surgeon: Marty Heck, MD;  Location: [place] [facility];  Service: Cardiovascular;  Laterality: N/A;  . AMPUTATION Right 09/09/2018   Procedure: AMPUTATION RIGHT GREAT TOE;  Surgeon: Waynetta Sandy, MD;  Location: [place];  Service: Vascular;  Laterality: Right;  . APPENDECTOMY    . CARDIAC CATHETERIZATION  X 2 then 03/03/2012    NL LVF, normal coronaries, vessels are small ([facility]: Dr. Beatrix Fetters)  . CATARACT EXTRACTION W/ INTRAOCULAR LENS  IMPLANT, BILATERAL Bilateral   . CHOLECYSTECTOMY    . HERNIA REPAIR       UHR  . LAPAROSCOPIC GASTRIC BANDING  2010  . PERICARDIAL WINDOW Left 01/24/2014   Procedure: PERICARDIAL WINDOW;  Surgeon: Gaye Pollack, MD;  Location: [place];  Service: Thoracic;  Laterality: Left;  . PERIPHERAL VASCULAR INTERVENTION Right 09/08/2018   Procedure: PERIPHERAL VASCULAR INTERVENTION;  Surgeon: Marty Heck, MD;  Location: [place] [facility];  Service: Cardiovascular;  Laterality: Right;  . SINUS EXPLORATION  X 2  . TEE WITHOUT CARDIOVERSION N/A 09/07/2018   Procedure: TRANSESOPHAGEAL ECHOCARDIOGRAM (TEE);  Surgeon: Acie Fredrickson Wonda Cheng, MD;  Location: [place];  Service: Cardiovascular;  Laterality: N/A;  . UMBILICAL HERNIA REPAIR    . VIDEO ASSISTED THORACOSCOPY Left 01/24/2014   Procedure: VIDEO ASSISTED THORACOSCOPY;  Surgeon: Gaye Pollack, MD;  Location: [facility] OR;  Service: Thoracic;  Laterality: Left;  VATS/open lung biopsy  . VIDEO BRONCHOSCOPY Bilateral 12/29/2013   Procedure: VIDEO BRONCHOSCOPY WITHOUT FLUORO;  Surgeon: Elsie Stain, MD;  Location: [facility];  Service: Endoscopy;  Laterality: Bilateral;  . VIDEO BRONCHOSCOPY N/A 01/24/2014   Procedure: VIDEO BRONCHOSCOPY;  Surgeon: Gaye Pollack, MD;  Location: [facility] OR;  Service: Thoracic;  Laterality: N/A;    Family History  Problem Relation Age of Onset  . Cancer Mother   . Stroke Father   . Heart disease Father   . Seizures Sister   . Diabetes Sister   . Anesthesia problems Neg Hx   . Hypotension Neg Hx   . Malignant hyperthermia Neg Hx   . Pseudochol deficiency Neg Hx     SOCIAL HISTORY: Social History   Tobacco Use  . Smoking status: Former Smoker    Types: Cigars    Last attempt to quit: 09/01/2006    Years since quitting: 12.0  . Smokeless tobacco: Never Used  Substance Use Topics  . 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 Hydrocodone Itching  . Morphine Itching  . Duloxetine Hcl Other (See Comments)  . Codeine  Itching  . Penicillins Itching and Rash    DID THE REACTION INVOLVE: Swelling of the face/tongue/throat, SOB, or low BP? No Sudden or severe rash/hives, skin peeling, or the inside of the mouth or nose? No Did it require medical treatment? No When did it last happen?unknown If all above answers are "NO", may proceed with cephalosporin use.     Current Outpatient Medications  Medication Sig Dispense Refill  . albuterol (PROVENTIL HFA;VENTOLIN HFA) 108 (90 BASE) MCG/ACT inhaler Inhale 2 puffs into the lungs every 6 (six) hours as needed for wheezing or shortness of breath.    Marland Kitchen albuterol (PROVENTIL) (2.5 MG/3ML) 0.083% nebulizer solution Take 3 mLs (2.5 mg total) by nebulization 2 (two) times daily. And as needed 75 mL 5  . Amino Acids-Protein Hydrolys (FEEDING SUPPLEMENT, PRO-STAT SUGAR FREE 64,) LIQD Take 30  mLs by mouth 2 (two) times daily. 887 mL 0  . atorvastatin (LIPITOR) 40 MG tablet Take 40 mg by mouth daily.    Marland Kitchen azelastine (ASTELIN) 137 MCG/SPRAY nasal spray Place 1 spray into both nostrils at bedtime as needed for allergies.     Marland Kitchen clopidogrel (PLAVIX) 75 MG tablet Take 1 tablet (75 mg total) by mouth daily with breakfast. 30 tablet 0  . diclofenac (VOLTAREN) 50 MG EC tablet Take 50 mg by mouth 2 (two) times daily.    Marland Kitchen diltiazem (CARDIZEM) 120 MG tablet Take 120 mg by mouth daily.    . feeding supplement, ENSURE ENLIVE, (ENSURE ENLIVE) LIQD Take 237 mLs by mouth 2 (two) times daily between meals. 237 mL 12  . ferrous sulfate 325 (65 FE) MG tablet Take 325 mg by mouth daily.    . fluticasone (FLONASE) 50 MCG/ACT nasal spray Place 1 spray into both nostrils daily as needed for allergies.     . Fluticasone-Salmeterol (ADVAIR) 500-50 MCG/DOSE AEPB Inhale 1 puff into the lungs every 12 (twelve) hours. 60 each 11  . hydrALAZINE (APRESOLINE) 100 MG tablet Take 100 mg by mouth at bedtime.    . hydrALAZINE (APRESOLINE) 50 MG tablet Take 50 mg by mouth every morning.    . isosorbide  mononitrate (IMDUR) 60 MG 24 hr tablet Take 60 mg by mouth every morning.    Marland Kitchen losartan (COZAAR) 100 MG tablet Take 100 mg by mouth daily.    . metFORMIN (GLUCOPHAGE) 1000 MG tablet Take 1,000 mg by mouth 2 (two) times daily with a meal.    . metolazone (ZAROXOLYN) 2.5 MG tablet Take 1 tablet (2.5 mg total) by mouth 2 (two) times a week. (Patient taking differently: Take 2.5 mg by mouth every Monday, Wednesday, and Friday. ) 30 tablet 3  . metoprolol succinate (TOPROL-XL) 25 MG 24 hr tablet Take 25 mg by mouth daily.    Marland Kitchen MOVANTIK 25 MG TABS Take 25 mg by mouth daily.     . Multiple Vitamin (MULTIVITAMIN WITH MINERALS) TABS tablet Take 1 tablet by mouth daily. 30 tablet 0  . nitroGLYCERIN (NITROSTAT) 0.4 MG SL tablet Place 0.4 mg under the tongue every 5 (five) minutes as needed for chest pain.     Marland Kitchen oxyCODONE ER 18 MG C12A Take 18 mg by mouth every 12 (twelve) hours.    . OXYGEN Inhale into the lungs. 2 liters at bedtime    . pantoprazole (PROTONIX) 40 MG tablet Take 40 mg by mouth daily.     Vladimir Faster Glycol-Propyl Glycol (SYSTANE PRESERVATIVE FREE) 0.4-0.3 % SOLN Apply 1 drop to eye 4 (four) times daily as needed (dryness of eye).     . Potassium Chloride ER 20 MEQ TBCR Take 20 mEq by mouth 3 (three) times daily.    . pramipexole (MIRAPEX) 1.5 MG tablet Take 1.5 mg by mouth at bedtime.    . pregabalin (LYRICA) 75 MG capsule Take 1 capsule (75 mg total) by mouth 2 (two) times daily. 60 capsule 0  . ranolazine (RANEXA) 500 MG 12 hr tablet Take 500 mg by mouth 2 (two) times daily.    Marland Kitchen torsemide (DEMADEX) 20 MG tablet Take 20 mg by mouth 2 (two) times daily.     Alveda Reasons 15 MG TABS tablet TAKE 1 TABLET BY MOUTH ONCE DAILY WITH EVENING MEAL (Patient taking differently: Take 15 mg by mouth daily after supper. ) 90 tablet 2  . XTAMPZA ER 27 MG C12A  No current facility-administered medications for this visit.     REVIEW OF SYSTEMS:  [X]  denotes positive finding, [ ]  denotes negative  finding Cardiac  Comments:  Chest pain or chest pressure:    Shortness of breath upon exertion:    Short of breath when lying flat:    Irregular heart rhythm:        Vascular    Pain in calf, thigh, or hip brought on by ambulation:    Pain in feet at night that wakes you up from your sleep:     Blood clot in your veins:    Leg swelling:         Pulmonary    Oxygen at home:    Productive cough:     Wheezing:         Neurologic    Sudden weakness in arms or legs:     Sudden numbness in arms or legs:     Sudden onset of difficulty speaking or slurred speech:    Temporary loss of vision in one eye:     Problems with dizziness:         Gastrointestinal    Blood in stool:     Vomited blood:         Genitourinary    Burning when urinating:     Blood in urine:        Psychiatric    Major depression:         Hematologic    Bleeding problems:    Problems with blood clotting too easily:        Skin    Rashes or ulcers:        Constitutional    Fever or chills:      PHYSICAL EXAM: Vitals:   09/28/18 1004  BP: 127/82  Pulse: 75  Resp: 20  Temp: 97.9 F (36.6 C)  SpO2: 95%  Weight: 235 lb (106.6 kg)  Height: 5\' 7"  (1.702 m)    GENERAL: The patient is a well-nourished male, in no acute distress. The vital signs are documented above. CARDIAC: There is a regular rate and rhythm.  VASCULAR:  Palpable R DP pulse 2+ Right lateral foot wound nearly healed Right great toe transmetatarsal amputation with no drainage or discharge Left great toe dry ulcer at tip of toe  DATA:   None  Assessment/Plan:  On my evaluation Jon [name] has a palpable right dorsalis pedis pulse in the foot and it is warm.  He still has some edema in the foot itself but the right great toe transmetatarsal amputation appears to be healing.  This lateral foot wound that his wife was worried about has also significantly improved since his clinic appointment was made and is showing signs of  healing as well.  He can keep his appointment next Friday with Dr. 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   Marty Heck, MD [facility] Office: [phone] Pager: [phone]

## 2018-09-29 DIAGNOSIS — G4733 Obstructive sleep apnea (adult) (pediatric): Secondary | ICD-10-CM | POA: Diagnosis not present

## 2018-10-01 DIAGNOSIS — G4733 Obstructive sleep apnea (adult) (pediatric): Secondary | ICD-10-CM | POA: Diagnosis not present

## 2018-10-02 DIAGNOSIS — G4733 Obstructive sleep apnea (adult) (pediatric): Secondary | ICD-10-CM | POA: Diagnosis not present

## 2018-10-04 ENCOUNTER — Encounter: Payer: Self-pay | Admitting: Internal Medicine

## 2018-10-04 ENCOUNTER — Ambulatory Visit (INDEPENDENT_AMBULATORY_CARE_PROVIDER_SITE_OTHER): Payer: Medicare Other | Admitting: Internal Medicine

## 2018-10-04 DIAGNOSIS — I96 Gangrene, not elsewhere classified: Secondary | ICD-10-CM | POA: Diagnosis not present

## 2018-10-04 NOTE — Assessment & Plan Note (Addendum)
I am hopeful that his MRSA bacteremia and right foot infection have been cured through combination of surgery and 3 weeks of IV vancomycin.  He is very eager to be up and moving around again.  He wants to know if I think he can start driving again.  I encouraged him to stay off of his foot as much as possible.  He is scheduled to follow-up with his vascular surgeon, Dr. Carlis Abbott, in 4 days to have his sutures removed.  He will follow-up here in 4 weeks.

## 2018-10-04 NOTE — Progress Notes (Signed)
[facility]  Patient Active Problem List   Diagnosis Date Noted  . MRSA bacteremia 09/02/2018    Priority: High  . Gangrene of toe of right foot ([place]) 09/01/2018    Priority: High  . PVD (peripheral vascular disease) ([place]) 09/28/2018  . Malnutrition of moderate degree 09/03/2018  . ARF (acute renal failure) ([place]) 09/01/2018  . Cellulitis of right foot 09/01/2018  . DM (diabetes mellitus), type 2 with renal complications ([place]) 60/45/4098  . Chronic pain of both knees 03/18/2017  . Primary osteoarthritis of both knees 03/18/2017  . Hypertensive heart failure ([place]) 05/07/2016  . Preoperative cardiovascular examination 02/24/2016  . Hemoptysis 08/01/2015  . Herpes zoster 06/26/2015  . Chronic respiratory failure ([place]) 04/10/2015  . PNA (pneumonia) 04/10/2015  . Chronic diastolic heart failure ([place]) 02/22/2015  . LBBB (left bundle branch block) 02/22/2015  . Peritoneal effusion, chronic 02/22/2015  . Atrial fibrillation ([place]) 02/20/2015  . Precordial pain 01/26/2015  . OSA on CPAP 01/25/2015  . Back pain 01/25/2015  . COPD (chronic obstructive pulmonary disease) ([place]) 01/25/2015  . Abnormal EKG 11/29/2014  . Acute renal insufficiency 11/29/2014  . Hyperkalemia-repeat pending 11/29/2014  . Troponin level elevated 11/29/2014  . Normal coronary arteries 2011 11/29/2014  . Long term (current) use of anticoagulants 03/21/2014  . CVA (cerebral vascular accident) ([place]) 02/09/2014  . Chronic anticoagulation-Xarelto 02/09/2014  . BOOP (bronchiolitis obliterans with organizing pneumonia) ([place]) 01/20/2014  . Pericardial effusion 01/04/2014  . Chronic atrial fibrillation ([place]) 01/03/2014  . Chronic bronchitis ([place]) 12/21/2013  . Uncontrolled type 2 diabetes mellitus with diabetic polyneuropathy, with long-term current use of insulin ([place]) 12/21/2013  . Hypothyroidism   . Dysrhythmia   . Hypertensive heart disease with heart failure ([place])   .  History of stroke   . Restless leg syndrome   . GERD (gastroesophageal reflux disease)   . Sleep apnea   . Hyperlipidemia   . Neuropathy   . Obesity (BMI 30-39.9)   . Bariatric surgery status 11/03/2013    Patient's Medications  New Prescriptions   No medications on file  Previous Medications   ALBUTEROL (PROVENTIL HFA;VENTOLIN HFA) 108 (90 BASE) MCG/ACT INHALER    Inhale 2 puffs into the lungs every 6 (six) hours as needed for wheezing or shortness of breath.   ALBUTEROL (PROVENTIL) (2.5 MG/3ML) 0.083% NEBULIZER SOLUTION    Take 3 mLs (2.5 mg total) by nebulization 2 (two) times daily. And as needed   AMINO ACIDS-PROTEIN HYDROLYS (FEEDING SUPPLEMENT, PRO-STAT SUGAR FREE 64,) LIQD    Take 30 mLs by mouth 2 (two) times daily.   ATORVASTATIN (LIPITOR) 40 MG TABLET    Take 40 mg by mouth daily.   AZELASTINE (ASTELIN) 137 MCG/SPRAY NASAL SPRAY    Place 1 spray into both nostrils at bedtime as needed for allergies.    CLOPIDOGREL (PLAVIX) 75 MG TABLET    Take 1 tablet (75 mg total) by mouth daily with breakfast.   DICLOFENAC (VOLTAREN) 50 MG EC TABLET    Take 50 mg by mouth 2 (two) times daily.   DILTIAZEM (CARDIZEM) 120 MG TABLET    Take 120 mg by mouth daily.   FEEDING SUPPLEMENT, ENSURE ENLIVE, (ENSURE ENLIVE) LIQD    Take 237 mLs by mouth 2 (two) times daily between meals.   FERROUS SULFATE 325 (65 FE) MG TABLET    Take 325 mg by mouth daily.   FLUTICASONE (FLONASE) 50 MCG/ACT NASAL SPRAY  Place 1 spray into both nostrils daily as needed for allergies.    FLUTICASONE-SALMETEROL (ADVAIR) 500-50 MCG/DOSE AEPB    Inhale 1 puff into the lungs every 12 (twelve) hours.   HYDRALAZINE (APRESOLINE) 100 MG TABLET    Take 100 mg by mouth at bedtime.   HYDRALAZINE (APRESOLINE) 50 MG TABLET    Take 50 mg by mouth every morning.   ISOSORBIDE MONONITRATE (IMDUR) 60 MG 24 HR TABLET    Take 60 mg by mouth every morning.   LOSARTAN (COZAAR) 100 MG TABLET    Take 100 mg by mouth daily.   METFORMIN  (GLUCOPHAGE) 1000 MG TABLET    Take 1,000 mg by mouth 2 (two) times daily with a meal.   METOLAZONE (ZAROXOLYN) 2.5 MG TABLET    Take 1 tablet (2.5 mg total) by mouth 2 (two) times a week.   METOPROLOL SUCCINATE (TOPROL-XL) 25 MG 24 HR TABLET    Take 25 mg by mouth daily.   MOVANTIK 25 MG TABS    Take 25 mg by mouth daily.    MULTIPLE VITAMIN (MULTIVITAMIN WITH MINERALS) TABS TABLET    Take 1 tablet by mouth daily.   NITROGLYCERIN (NITROSTAT) 0.4 MG SL TABLET    Place 0.4 mg under the tongue every 5 (five) minutes as needed for chest pain.    OXYCODONE ER 18 MG C12A    Take 18 mg by mouth every 12 (twelve) hours.   OXYGEN    Inhale into the lungs. 2 liters at bedtime   PANTOPRAZOLE (PROTONIX) 40 MG TABLET    Take 40 mg by mouth daily.    POLYETHYL GLYCOL-PROPYL GLYCOL (SYSTANE PRESERVATIVE FREE) 0.4-0.3 % SOLN    Apply 1 drop to eye 4 (four) times daily as needed (dryness of eye).    POTASSIUM CHLORIDE ER 20 MEQ TBCR    Take 20 mEq by mouth 3 (three) times daily.   PRAMIPEXOLE (MIRAPEX) 1.5 MG TABLET    Take 1.5 mg by mouth at bedtime.   PREGABALIN (LYRICA) 75 MG CAPSULE    Take 1 capsule (75 mg total) by mouth 2 (two) times daily.   RANOLAZINE (RANEXA) 500 MG 12 HR TABLET    Take 500 mg by mouth 2 (two) times daily.   TORSEMIDE (DEMADEX) 20 MG TABLET    Take 20 mg by mouth 2 (two) times daily.    XARELTO 15 MG TABS TABLET    TAKE 1 TABLET BY MOUTH ONCE DAILY WITH EVENING MEAL   XTAMPZA ER 27 MG C12A      Modified Medications   No medications on file  Discontinued Medications   No medications on file    Subjective: Mr. Isola is in with his daughter for his hospital follow-up visit.  He was hospitalized with a right great toe ischemia and MRSA infection complicated by bacteremia.  He had no clinical or echocardiographic evidence of endocarditis and follow-up blood cultures were negative.  He underwent amputation of his right great toe and metatarsal.  He completed 3 weeks of IV vancomycin on  09/19/2018.  His PICC was removed.  He has not had any fever, chills or sweats.  When he first went home he was under the impression that he needed to be up walking as much as possible for physical therapy.  He was having a large amount of serosanguineous drainage from his right foot incision.  When he learned that he was supposed to stay off his foot as much as possible the drainage  is decreased.  He recently developed some redness on his right lateral foot.  His daughter felt like it was caused by pressure from his boot.  She put some foam inside the boot and that area is improving.  Review of Systems: Review of Systems  Constitutional: Negative for diaphoresis and fever.  Gastrointestinal: Negative for abdominal pain, diarrhea, nausea and vomiting.  Musculoskeletal: Positive for joint pain.  Skin: Negative for rash.    Past Medical History:  Diagnosis Date  . Abnormal EKG 11/29/2014  . Acute renal insufficiency 11/29/2014  . Angina   . Arthritis    "knees" (01/18/2014)  . Asthma   . Atrial fibrillation ([place])   . Back pain 01/25/2015  . 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 BOOP (bronchiolitis obliterans with organizing pneumonia) ([place]) 01/20/2014   OLBx 01/20/14 BOOP Started steroids 02/28/14   . Chronic anticoagulation-Xarelto 02/09/2014  . Chronic atrial fibrillation 01/03/2014  . Chronic bronchitis ([place]) 12/21/2013  . Chronic diastolic heart failure ([place]) 02/22/2015  . Chronic pain of both knees 03/18/2017   Overview:  Added automatically from request for surgery [id number]  . Chronic respiratory failure ([place]) 04/10/2015  . COPD (chronic obstructive pulmonary disease) ([place]) 01/25/2015  . Cough    coughing up blood- started last nite, seems to be worse now  . CVA (cerebral vascular accident) ([place]) 02/09/2014  . Dysrhythmia    atrial fib takes cardizem,   . Fibromyalgia   . GERD (gastroesophageal reflux disease)   . H/O hiatal hernia   . Hemoptysis 08/01/2015  . Herpes zoster 06/26/2015  .  History of stroke   . Hyperkalemia-repeat pending 11/29/2014  . Hyperlipemia   . Hyperlipidemia   . Hypertension   . Hypothyroidism   . IDDM (insulin dependent diabetes mellitus) (HCC)    insulin pump   . Long term (current) use of anticoagulants 03/21/2014  . Mild CAD 02/20/2015   Overview:  On recent cardiac cath  Overview:  On recent cardiac cath  . Myocardial infarction [facility])    "one dr says yes; another says no"  . Neuromuscular disorder (HCC)    rls, neuropathy  . Neuropathy    rls, neuropathy   . Normal coronary arteries 2011 11/29/2014  . Obesity (BMI 30-39.9)   . On home oxygen therapy    "2L w/CPAP at bedtime" (09/01/2018)  . OSA on CPAP    cpap & oxygen  . Pericardial effusion   . Peritoneal effusion, chronic 02/22/2015   Overview:  With surgical window  . PNA (pneumonia) 04/10/2015  . Pneumonia 5/15   "several times" (01/18/2014)  . Precordial pain 01/26/2015  . Preoperative cardiovascular examination 02/24/2016  . Primary osteoarthritis of both knees 03/18/2017   Overview:  Added automatically from request for surgery [id number]  . Restless leg syndrome   . Sleep apnea    cpap & oxygen   . Stroke [facility]) 2012   denies residual on 01/18/2014  . Troponin level elevated 11/29/2014  . Uncontrolled type 2 diabetes mellitus with diabetic polyneuropathy, with long-term current use of insulin ([place]) 12/21/2013    Social History   Tobacco Use  . Smoking status: Former Smoker    Types: Cigars    Last attempt to quit: 09/01/2006    Years since quitting: 12.0  . Smokeless tobacco: Never Used  Substance Use Topics  . 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 Drug use: No    Family History  Problem Relation  Age of Onset  . Cancer Mother   . Stroke Father   . Heart disease Father   . Seizures Sister   . Diabetes Sister   . Anesthesia problems Neg Hx   . Hypotension Neg Hx   . Malignant hyperthermia Neg Hx   . Pseudochol deficiency Neg Hx      Allergies  Allergen Reactions  . Hydrocodone Itching  . Morphine Itching  . Duloxetine Hcl Other (See Comments)  . Codeine Itching  . Penicillins Itching and Rash    DID THE REACTION INVOLVE: Swelling of the face/tongue/throat, SOB, or low BP? No Sudden or severe rash/hives, skin peeling, or the inside of the mouth or nose? No Did it require medical treatment? No When did it last happen?unknown If all above answers are "NO", may proceed with cephalosporin use.     Objective: Vitals:   10/04/18 0940  BP: (!) 151/71  Pulse: 75  Temp: 99 F (37.2 C)  TempSrc: Oral  Weight: 247 lb (112 kg)   Body mass index is 38.69 kg/m.  Physical Exam Constitutional:      Comments: He is talkative and in good spirits.  Cardiovascular:     Rate and Rhythm: Normal rate and regular rhythm.     Heart sounds: No murmur.  Pulmonary:     Effort: Pulmonary effort is normal.     Breath sounds: Normal breath sounds.  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  He has noted some increased redness and small blisters on his right shin.  He has not been able to use his compression stocking on the right leg since he had his surgery.  His right foot surgical incision is looking better than the earlier pictures that his daughter shows me.  The area on his lateral foot is also looking better than photos taken 1 week ago.  Psychiatric:        Mood and Affect: Mood normal.             Lab Results    Problem List Items Addressed This Visit      High   Gangrene of toe of right foot ([place])    I am hopeful that his MRSA bacteremia and right foot infection have been cured through combination of surgery and 3 weeks of IV vancomycin.  He is very eager to be up and moving around again.  He wants to know if I think he can start driving again.  I encouraged him to stay off of his foot as much as possible.  He is scheduled to follow-up with his  vascular surgeon, Dr. Carlis Abbott, in 4 days to have his sutures removed.  He will follow-up here in 4 weeks.          Michel Bickers, MD [facility] [phone] pager   [phone] cell 10/04/2018, 10:06 AM

## 2018-10-08 ENCOUNTER — Ambulatory Visit (INDEPENDENT_AMBULATORY_CARE_PROVIDER_SITE_OTHER)
Admission: RE | Admit: 2018-10-08 | Discharge: 2018-10-08 | Disposition: A | Discharge status: Home / Self Care | Payer: Medicare Other | Source: Ambulatory Visit | Attending: Family | Admitting: Family

## 2018-10-08 ENCOUNTER — Ambulatory Visit (INDEPENDENT_AMBULATORY_CARE_PROVIDER_SITE_OTHER): Payer: Self-pay | Admitting: Vascular Surgery

## 2018-10-08 ENCOUNTER — Ambulatory Visit (HOSPITAL_COMMUNITY)
Admission: RE | Admit: 2018-10-08 | Discharge: 2018-10-08 | Disposition: A | Discharge status: Home / Self Care | Payer: Medicare Other | Source: Ambulatory Visit | Attending: Family | Admitting: Family

## 2018-10-08 ENCOUNTER — Encounter: Payer: Self-pay | Admitting: *Deleted

## 2018-10-08 ENCOUNTER — Other Ambulatory Visit: Payer: Self-pay | Admitting: *Deleted

## 2018-10-08 ENCOUNTER — Other Ambulatory Visit: Payer: Self-pay

## 2018-10-08 ENCOUNTER — Encounter: Payer: Self-pay | Admitting: Vascular Surgery

## 2018-10-08 VITALS — BP 135/84 | HR 72 | Temp 97.6°F | Resp 20 | Ht 67.0 in | Wt 247.0 lb

## 2018-10-08 DIAGNOSIS — I779 Disorder of arteries and arterioles, unspecified: Secondary | ICD-10-CM | POA: Diagnosis not present

## 2018-10-08 DIAGNOSIS — J449 Chronic obstructive pulmonary disease, unspecified: Secondary | ICD-10-CM | POA: Diagnosis not present

## 2018-10-08 DIAGNOSIS — I739 Peripheral vascular disease, unspecified: Secondary | ICD-10-CM

## 2018-10-08 NOTE — Progress Notes (Signed)
Patient ID: Jon [name], male   DOB: Dec 11, 1950, 68 y.o.   MRN: [id number]  Reason for Consult: Wound Check (RLE ABI)   Referred by Rochel Brome, MD  Subjective:     HPI:  Jon [name] is a 68 y.o. male recently underwent right lower extremity revascularization and first amputation.  First toe is now healing well.  He does have swelling of his bilateral legs right greater than left.  Has toe tip ulceration on the left which has not changed.  Not having any complaints today.  Does take Plavix and Xarelto at home.  Walking with the help of Darco shoe.  Past Medical History:  Diagnosis Date  . Abnormal EKG 11/29/2014  . Acute renal insufficiency 11/29/2014  . Angina   . Arthritis    "knees" (01/18/2014)  . Asthma   . Atrial fibrillation ([place])   . Back pain 01/25/2015  . 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 BOOP (bronchiolitis obliterans with organizing pneumonia) ([place]) 01/20/2014   OLBx 01/20/14 BOOP Started steroids 02/28/14   . Chronic anticoagulation-Xarelto 02/09/2014  . Chronic atrial fibrillation 01/03/2014  . Chronic bronchitis ([place]) 12/21/2013  . Chronic diastolic heart failure ([place]) 02/22/2015  . Chronic pain of both knees 03/18/2017   Overview:  Added automatically from request for surgery [id number]  . Chronic respiratory failure ([place]) 04/10/2015  . COPD (chronic obstructive pulmonary disease) ([place]) 01/25/2015  . Cough    coughing up blood- started last nite, seems to be worse now  . CVA (cerebral vascular accident) ([place]) 02/09/2014  . Dysrhythmia    atrial fib takes cardizem,   . Fibromyalgia   . GERD (gastroesophageal reflux disease)   . H/O hiatal hernia   . Hemoptysis 08/01/2015  . Herpes zoster 06/26/2015  . History of stroke   . Hyperkalemia-repeat pending 11/29/2014  . Hyperlipemia   . Hyperlipidemia   . Hypertension   . Hypothyroidism   . IDDM (insulin dependent diabetes mellitus) (HCC)    insulin pump   . Long term (current) use of anticoagulants 03/21/2014  .  Mild CAD 02/20/2015   Overview:  On recent cardiac cath  Overview:  On recent cardiac cath  . Myocardial infarction [facility])    "one dr says yes; another says no"  . Neuromuscular disorder (HCC)    rls, neuropathy  . Neuropathy    rls, neuropathy   . Normal coronary arteries 2011 11/29/2014  . Obesity (BMI 30-39.9)   . On home oxygen therapy    "2L w/CPAP at bedtime" (09/01/2018)  . OSA on CPAP    cpap & oxygen  . Pericardial effusion   . Peritoneal effusion, chronic 02/22/2015   Overview:  With surgical window  . PNA (pneumonia) 04/10/2015  . Pneumonia 5/15   "several times" (01/18/2014)  . Precordial pain 01/26/2015  . Preoperative cardiovascular examination 02/24/2016  . Primary osteoarthritis of both knees 03/18/2017   Overview:  Added automatically from request for surgery [id number]  . Restless leg syndrome   . Sleep apnea    cpap & oxygen   . Stroke [facility]) 2012   denies residual on 01/18/2014  . Troponin level elevated 11/29/2014  . Uncontrolled type 2 diabetes mellitus with diabetic polyneuropathy, with long-term current use of insulin ([place]) 12/21/2013   Family History  Problem Relation Age of Onset  . Cancer Mother   . Stroke Father   . Heart disease Father   . Seizures Sister   . Diabetes Sister   . Anesthesia problems  Neg Hx   . Hypotension Neg Hx   . Malignant hyperthermia Neg Hx   . Pseudochol deficiency Neg Hx    Past Surgical History:  Procedure Laterality Date  . ABDOMINAL AORTOGRAM W/LOWER EXTREMITY N/A 09/08/2018   Procedure: ABDOMINAL AORTOGRAM W/LOWER EXTREMITY;  Surgeon: Marty Heck, MD;  Location: [place] [facility];  Service: Cardiovascular;  Laterality: N/A;  . AMPUTATION Right 09/09/2018   Procedure: AMPUTATION RIGHT GREAT TOE;  Surgeon: Waynetta Sandy, MD;  Location: [place];  Service: Vascular;  Laterality: Right;  . APPENDECTOMY    . CARDIAC CATHETERIZATION  X 2 then 03/03/2012    NL LVF, normal coronaries, vessels are small ([facility]: Dr. Beatrix Fetters)    . CATARACT EXTRACTION W/ INTRAOCULAR LENS  IMPLANT, BILATERAL Bilateral   . CHOLECYSTECTOMY    . HERNIA REPAIR     UHR  . LAPAROSCOPIC GASTRIC BANDING  2010  . PERICARDIAL WINDOW Left 01/24/2014   Procedure: PERICARDIAL WINDOW;  Surgeon: Gaye Pollack, MD;  Location: [place];  Service: Thoracic;  Laterality: Left;  . PERIPHERAL VASCULAR INTERVENTION Right 09/08/2018   Procedure: PERIPHERAL VASCULAR INTERVENTION;  Surgeon: Marty Heck, MD;  Location: [place] [facility];  Service: Cardiovascular;  Laterality: Right;  . SINUS EXPLORATION  X 2  . TEE WITHOUT CARDIOVERSION N/A 09/07/2018   Procedure: TRANSESOPHAGEAL ECHOCARDIOGRAM (TEE);  Surgeon: Acie Fredrickson Wonda Cheng, MD;  Location: [place];  Service: Cardiovascular;  Laterality: N/A;  . UMBILICAL HERNIA REPAIR    . VIDEO ASSISTED THORACOSCOPY Left 01/24/2014   Procedure: VIDEO ASSISTED THORACOSCOPY;  Surgeon: Gaye Pollack, MD;  Location: [facility] OR;  Service: Thoracic;  Laterality: Left;  VATS/open lung biopsy  . VIDEO BRONCHOSCOPY Bilateral 12/29/2013   Procedure: VIDEO BRONCHOSCOPY WITHOUT FLUORO;  Surgeon: Elsie Stain, MD;  Location: [facility];  Service: Endoscopy;  Laterality: Bilateral;  . VIDEO BRONCHOSCOPY N/A 01/24/2014   Procedure: VIDEO BRONCHOSCOPY;  Surgeon: Gaye Pollack, MD;  Location: [facility] OR;  Service: Thoracic;  Laterality: N/A;    Short Social History:  Social History   Tobacco Use  . Smoking status: Former Smoker    Types: Cigars    Last attempt to quit: 09/01/2006    Years since quitting: 12.1  . Smokeless tobacco: Never Used  Substance Use Topics  . 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 Hydrocodone Itching  . Morphine Itching  . Duloxetine Hcl Other (See Comments)  . Codeine Itching  . Penicillins Itching and Rash    DID THE REACTION INVOLVE: Swelling of the face/tongue/throat, SOB, or low BP? No Sudden or severe rash/hives, skin  peeling, or the inside of the mouth or nose? No Did it require medical treatment? No When did it last happen?unknown If all above answers are "NO", may proceed with cephalosporin use.     Current Outpatient Medications  Medication Sig Dispense Refill  . albuterol (PROVENTIL HFA;VENTOLIN HFA) 108 (90 BASE) MCG/ACT inhaler Inhale 2 puffs into the lungs every 6 (six) hours as needed for wheezing or shortness of breath.    Marland Kitchen albuterol (PROVENTIL) (2.5 MG/3ML) 0.083% nebulizer solution Take 3 mLs (2.5 mg total) by nebulization 2 (two) times daily. And as needed 75 mL 5  . Amino Acids-Protein Hydrolys (FEEDING SUPPLEMENT, PRO-STAT SUGAR FREE 64,) LIQD Take 30 mLs by mouth 2 (two) times daily. 887 mL 0  . atorvastatin (LIPITOR) 40 MG tablet Take 40  mg by mouth daily.    Marland Kitchen azelastine (ASTELIN) 137 MCG/SPRAY nasal spray Place 1 spray into both nostrils at bedtime as needed for allergies.     Marland Kitchen clopidogrel (PLAVIX) 75 MG tablet Take 1 tablet (75 mg total) by mouth daily with breakfast. 30 tablet 0  . diclofenac (VOLTAREN) 50 MG EC tablet Take 50 mg by mouth 2 (two) times daily.    Marland Kitchen diltiazem (CARDIZEM) 120 MG tablet Take 120 mg by mouth daily.    . feeding supplement, ENSURE ENLIVE, (ENSURE ENLIVE) LIQD Take 237 mLs by mouth 2 (two) times daily between meals. 237 mL 12  . ferrous sulfate 325 (65 FE) MG tablet Take 325 mg by mouth daily.    . fluticasone (FLONASE) 50 MCG/ACT nasal spray Place 1 spray into both nostrils daily as needed for allergies.     . Fluticasone-Salmeterol (ADVAIR) 500-50 MCG/DOSE AEPB Inhale 1 puff into the lungs every 12 (twelve) hours. 60 each 11  . hydrALAZINE (APRESOLINE) 100 MG tablet Take 100 mg by mouth at bedtime.    . hydrALAZINE (APRESOLINE) 50 MG tablet Take 50 mg by mouth every morning.    . isosorbide mononitrate (IMDUR) 60 MG 24 hr tablet Take 60 mg by mouth every morning.    Marland Kitchen losartan (COZAAR) 100 MG tablet Take 100 mg by mouth daily.    . metFORMIN  (GLUCOPHAGE) 1000 MG tablet Take 1,000 mg by mouth 2 (two) times daily with a meal.    . metolazone (ZAROXOLYN) 2.5 MG tablet Take 1 tablet (2.5 mg total) by mouth 2 (two) times a week. (Patient taking differently: Take 2.5 mg by mouth every Monday, Wednesday, and Friday. ) 30 tablet 3  . metoprolol succinate (TOPROL-XL) 25 MG 24 hr tablet Take 25 mg by mouth daily.    Marland Kitchen MOVANTIK 25 MG TABS Take 25 mg by mouth daily.     . Multiple Vitamin (MULTIVITAMIN WITH MINERALS) TABS tablet Take 1 tablet by mouth daily. 30 tablet 0  . nitroGLYCERIN (NITROSTAT) 0.4 MG SL tablet Place 0.4 mg under the tongue every 5 (five) minutes as needed for chest pain.     Marland Kitchen oxyCODONE ER 18 MG C12A Take 18 mg by mouth every 12 (twelve) hours.    . OXYGEN Inhale into the lungs. 2 liters at bedtime    . pantoprazole (PROTONIX) 40 MG tablet Take 40 mg by mouth daily.     Vladimir Faster Glycol-Propyl Glycol (SYSTANE PRESERVATIVE FREE) 0.4-0.3 % SOLN Apply 1 drop to eye 4 (four) times daily as needed (dryness of eye).     . Potassium Chloride ER 20 MEQ TBCR Take 20 mEq by mouth 3 (three) times daily.    . pramipexole (MIRAPEX) 1.5 MG tablet Take 1.5 mg by mouth at bedtime.    . pregabalin (LYRICA) 75 MG capsule Take 1 capsule (75 mg total) by mouth 2 (two) times daily. 60 capsule 0  . ranolazine (RANEXA) 500 MG 12 hr tablet Take 500 mg by mouth 2 (two) times daily.    Marland Kitchen torsemide (DEMADEX) 20 MG tablet Take 20 mg by mouth 2 (two) times daily.     Alveda Reasons 15 MG TABS tablet TAKE 1 TABLET BY MOUTH ONCE DAILY WITH EVENING MEAL (Patient taking differently: Take 15 mg by mouth daily after supper. ) 90 tablet 2  . XTAMPZA ER 27 MG C12A      No current facility-administered medications for this visit.     Review of Systems  HENT: HENT  negative.  Eyes: Eyes negative.  Cardiovascular: Positive for leg swelling.  GI: Gastrointestinal negative.  Musculoskeletal: Positive for leg pain.  Skin: Positive for wound.  Neurological:  Neurological negative. Hematologic: Hematologic/lymphatic negative.  Psychiatric: Psychiatric negative.        Objective:  Objective   Vitals:   10/08/18 1043  BP: 135/84  Pulse: 72  Resp: 20  Temp: 97.6 F (36.4 C)  SpO2: 98%  Weight: 247 lb (112 kg)  Height: 5\' 7"  (1.702 m)   Body mass index is 38.69 kg/m.  Physical Exam HENT:     Head: Normocephalic.  Eyes:     Pupils: Pupils are equal, round, and reactive to light.  Cardiovascular:     Rate and Rhythm: Normal rate.     Pulses:          Dorsalis pedis pulses are 2+ on the right side.  Pulmonary:     Effort: Pulmonary effort is normal.  Abdominal:     General: Abdomen is flat.  Musculoskeletal:     Right lower leg: Edema present.     Left lower leg: Edema present.     Comments: Healing right first toe amputation  Neurological:     General: No focal deficit present.     Mental Status: He is alert.  Psychiatric:        Mood and Affect: Mood normal.        Thought Content: Thought content normal.        Judgment: Judgment normal.         Assessment/Plan:     68 year old male presents for evaluation right great toe amputation site following revascularization of his right lower extremity.  Sutures were removed today he will continue his Darco shoe.  We will proceed with left lower extremity angiogram in the near future.  We will hold Xarelto for 48 hours prior to procedure.  Does not appear to be any to amputation on the left side at this time.    Waynetta Sandy MD [facility]

## 2018-10-11 ENCOUNTER — Other Ambulatory Visit: Payer: Self-pay

## 2018-10-11 ENCOUNTER — Ambulatory Visit (HOSPITAL_COMMUNITY)
Admission: RE | Disposition: A | Discharge status: Home / Self Care | Payer: Self-pay | Source: Home / Self Care | Attending: Vascular Surgery

## 2018-10-11 ENCOUNTER — Ambulatory Visit (HOSPITAL_COMMUNITY)
Admission: RE | Admit: 2018-10-11 | Discharge: 2018-10-11 | Disposition: A | Discharge status: Home / Self Care | Payer: Medicare Other | Attending: Vascular Surgery | Admitting: Vascular Surgery

## 2018-10-11 DIAGNOSIS — Z9884 Bariatric surgery status: Secondary | ICD-10-CM | POA: Insufficient documentation

## 2018-10-11 DIAGNOSIS — L97529 Non-pressure chronic ulcer of other part of left foot with unspecified severity: Secondary | ICD-10-CM | POA: Diagnosis not present

## 2018-10-11 DIAGNOSIS — I5032 Chronic diastolic (congestive) heart failure: Secondary | ICD-10-CM | POA: Diagnosis not present

## 2018-10-11 DIAGNOSIS — I70262 Atherosclerosis of native arteries of extremities with gangrene, left leg: Secondary | ICD-10-CM | POA: Insufficient documentation

## 2018-10-11 DIAGNOSIS — Z7901 Long term (current) use of anticoagulants: Secondary | ICD-10-CM | POA: Diagnosis not present

## 2018-10-11 DIAGNOSIS — M797 Fibromyalgia: Secondary | ICD-10-CM | POA: Diagnosis not present

## 2018-10-11 DIAGNOSIS — E1142 Type 2 diabetes mellitus with diabetic polyneuropathy: Secondary | ICD-10-CM | POA: Insufficient documentation

## 2018-10-11 DIAGNOSIS — Z87891 Personal history of nicotine dependence: Secondary | ICD-10-CM | POA: Diagnosis not present

## 2018-10-11 DIAGNOSIS — I482 Chronic atrial fibrillation, unspecified: Secondary | ICD-10-CM | POA: Insufficient documentation

## 2018-10-11 DIAGNOSIS — Z8249 Family history of ischemic heart disease and other diseases of the circulatory system: Secondary | ICD-10-CM | POA: Insufficient documentation

## 2018-10-11 DIAGNOSIS — I252 Old myocardial infarction: Secondary | ICD-10-CM | POA: Insufficient documentation

## 2018-10-11 DIAGNOSIS — Z888 Allergy status to other drugs, medicaments and biological substances status: Secondary | ICD-10-CM | POA: Diagnosis not present

## 2018-10-11 DIAGNOSIS — K219 Gastro-esophageal reflux disease without esophagitis: Secondary | ICD-10-CM | POA: Diagnosis not present

## 2018-10-11 DIAGNOSIS — Z79899 Other long term (current) drug therapy: Secondary | ICD-10-CM | POA: Insufficient documentation

## 2018-10-11 DIAGNOSIS — Z885 Allergy status to narcotic agent status: Secondary | ICD-10-CM | POA: Diagnosis not present

## 2018-10-11 DIAGNOSIS — Z89411 Acquired absence of right great toe: Secondary | ICD-10-CM | POA: Insufficient documentation

## 2018-10-11 DIAGNOSIS — I132 Hypertensive heart and chronic kidney disease with heart failure and with stage 5 chronic kidney disease, or end stage renal disease: Secondary | ICD-10-CM | POA: Diagnosis not present

## 2018-10-11 DIAGNOSIS — Z7902 Long term (current) use of antithrombotics/antiplatelets: Secondary | ICD-10-CM | POA: Insufficient documentation

## 2018-10-11 DIAGNOSIS — E11621 Type 2 diabetes mellitus with foot ulcer: Secondary | ICD-10-CM | POA: Insufficient documentation

## 2018-10-11 DIAGNOSIS — E039 Hypothyroidism, unspecified: Secondary | ICD-10-CM | POA: Diagnosis not present

## 2018-10-11 DIAGNOSIS — Z6836 Body mass index (BMI) 36.0-36.9, adult: Secondary | ICD-10-CM | POA: Insufficient documentation

## 2018-10-11 DIAGNOSIS — G4733 Obstructive sleep apnea (adult) (pediatric): Secondary | ICD-10-CM | POA: Diagnosis not present

## 2018-10-11 DIAGNOSIS — Z880 Allergy status to penicillin: Secondary | ICD-10-CM | POA: Diagnosis not present

## 2018-10-11 DIAGNOSIS — N186 End stage renal disease: Secondary | ICD-10-CM | POA: Insufficient documentation

## 2018-10-11 DIAGNOSIS — E669 Obesity, unspecified: Secondary | ICD-10-CM | POA: Diagnosis not present

## 2018-10-11 DIAGNOSIS — E1152 Type 2 diabetes mellitus with diabetic peripheral angiopathy with gangrene: Secondary | ICD-10-CM | POA: Diagnosis not present

## 2018-10-11 DIAGNOSIS — Z833 Family history of diabetes mellitus: Secondary | ICD-10-CM | POA: Insufficient documentation

## 2018-10-11 DIAGNOSIS — I70245 Atherosclerosis of native arteries of left leg with ulceration of other part of foot: Secondary | ICD-10-CM | POA: Diagnosis not present

## 2018-10-11 DIAGNOSIS — E1122 Type 2 diabetes mellitus with diabetic chronic kidney disease: Secondary | ICD-10-CM | POA: Diagnosis not present

## 2018-10-11 DIAGNOSIS — J449 Chronic obstructive pulmonary disease, unspecified: Secondary | ICD-10-CM | POA: Diagnosis not present

## 2018-10-11 DIAGNOSIS — Z823 Family history of stroke: Secondary | ICD-10-CM | POA: Insufficient documentation

## 2018-10-11 DIAGNOSIS — Z9981 Dependence on supplemental oxygen: Secondary | ICD-10-CM | POA: Insufficient documentation

## 2018-10-11 DIAGNOSIS — Z8673 Personal history of transient ischemic attack (TIA), and cerebral infarction without residual deficits: Secondary | ICD-10-CM | POA: Insufficient documentation

## 2018-10-11 DIAGNOSIS — E785 Hyperlipidemia, unspecified: Secondary | ICD-10-CM | POA: Diagnosis not present

## 2018-10-11 HISTORY — PX: LOWER EXTREMITY ANGIOGRAPHY: CATH118251

## 2018-10-11 LAB — POCT I-STAT CREATININE: CREATININE: 1.6 mg/dL — AB (ref 0.61–1.24)

## 2018-10-11 LAB — POCT I-STAT 4, (NA,K, GLUC, HGB,HCT)
Glucose, Bld: 197 mg/dL — ABNORMAL HIGH (ref 70–99)
HCT: 34 % — ABNORMAL LOW (ref 39.0–52.0)
Hemoglobin: 11.6 g/dL — ABNORMAL LOW (ref 13.0–17.0)
Potassium: 3.8 mmol/L (ref 3.5–5.1)
Sodium: 141 mmol/L (ref 135–145)

## 2018-10-11 LAB — GLUCOSE, CAPILLARY: Glucose-Capillary: 188 mg/dL — ABNORMAL HIGH (ref 70–99)

## 2018-10-11 SURGERY — LOWER EXTREMITY ANGIOGRAPHY
Anesthesia: LOCAL | Laterality: Left

## 2018-10-11 MED ORDER — HYDRALAZINE HCL 20 MG/ML IJ SOLN: 5.0000 mg | INTRAMUSCULAR | Status: DC | PRN

## 2018-10-11 MED ORDER — HYDRALAZINE HCL 20 MG/ML IJ SOLN
INTRAMUSCULAR | Status: AC
  Filled 2018-10-11: qty 1

## 2018-10-11 MED ORDER — SODIUM CHLORIDE 0.9% FLUSH: 3.0000 mL | INTRAVENOUS | Status: DC | PRN

## 2018-10-11 MED ORDER — SODIUM CHLORIDE 0.9 % IV SOLN: INTRAVENOUS | Status: DC

## 2018-10-11 MED ORDER — ONDANSETRON HCL 4 MG/2ML IJ SOLN: 4.0000 mg | Freq: Four times a day (QID) | INTRAMUSCULAR | Status: DC | PRN

## 2018-10-11 MED ORDER — MIDAZOLAM HCL 2 MG/2ML IJ SOLN
INTRAMUSCULAR | Status: AC
  Filled 2018-10-11: qty 2

## 2018-10-11 MED ORDER — HEPARIN (PORCINE) IN NACL 1000-0.9 UT/500ML-% IV SOLN
INTRAVENOUS | Status: DC | PRN
  Administered 2018-10-11 (×2): 500 mL

## 2018-10-11 MED ORDER — IODIXANOL 320 MG/ML IV SOLN
INTRAVENOUS | Status: DC | PRN
  Administered 2018-10-11: 15 mL via INTRA_ARTERIAL

## 2018-10-11 MED ORDER — MORPHINE SULFATE (PF) 10 MG/ML IV SOLN: 2.0000 mg | INTRAVENOUS | Status: DC | PRN

## 2018-10-11 MED ORDER — OXYCODONE HCL 5 MG PO TABS: 5.0000 mg | ORAL_TABLET | ORAL | Status: DC | PRN

## 2018-10-11 MED ORDER — LIDOCAINE HCL (PF) 1 % IJ SOLN
INTRAMUSCULAR | Status: AC
  Filled 2018-10-11: qty 30

## 2018-10-11 MED ORDER — ACETAMINOPHEN 325 MG PO TABS: 650.0000 mg | ORAL_TABLET | ORAL | Status: DC | PRN

## 2018-10-11 MED ORDER — MIDAZOLAM HCL 2 MG/2ML IJ SOLN
INTRAMUSCULAR | Status: DC | PRN
  Administered 2018-10-11: 1 mg via INTRAVENOUS

## 2018-10-11 MED ORDER — SODIUM CHLORIDE 0.9 % IV SOLN: 250.0000 mL | INTRAVENOUS | Status: DC | PRN

## 2018-10-11 MED ORDER — HEPARIN (PORCINE) IN NACL 1000-0.9 UT/500ML-% IV SOLN
INTRAVENOUS | Status: AC
  Filled 2018-10-11: qty 1000

## 2018-10-11 MED ORDER — LABETALOL HCL 5 MG/ML IV SOLN: 10.0000 mg | INTRAVENOUS | Status: DC | PRN

## 2018-10-11 MED ORDER — SODIUM CHLORIDE 0.9% FLUSH: 3.0000 mL | Freq: Two times a day (BID) | INTRAVENOUS | Status: DC

## 2018-10-11 MED ORDER — SODIUM CHLORIDE 0.9 % IV SOLN
INTRAVENOUS | Status: DC
  Administered 2018-10-11: 12:00:00 via INTRAVENOUS

## 2018-10-11 MED ORDER — FENTANYL CITRATE (PF) 100 MCG/2ML IJ SOLN
INTRAMUSCULAR | Status: DC | PRN
  Administered 2018-10-11: 50 ug via INTRAVENOUS

## 2018-10-11 MED ORDER — HYDRALAZINE HCL 20 MG/ML IJ SOLN
INTRAMUSCULAR | Status: DC | PRN
  Administered 2018-10-11: 10 mg via INTRAVENOUS

## 2018-10-11 MED ORDER — FENTANYL CITRATE (PF) 100 MCG/2ML IJ SOLN
INTRAMUSCULAR | Status: AC
  Filled 2018-10-11: qty 2

## 2018-10-11 SURGICAL SUPPLY — 12 items
CATH OMNI FLUSH 5F 65CM (CATHETERS) ×1 IMPLANT
CATH TEMPO AQUA 5F 100CM (CATHETERS) ×1 IMPLANT
CLOSURE MYNX CONTROL 5F (Vascular Products) ×1 IMPLANT
GLIDEWIRE ADV .035X260CM (WIRE) ×1 IMPLANT
KIT MICROPUNCTURE NIT STIFF (SHEATH) ×1 IMPLANT
KIT PV (KITS) ×2 IMPLANT
SHEATH PINNACLE 5F 10CM (SHEATH) ×1 IMPLANT
SHEATH PROBE COVER 6X72 (BAG) ×1 IMPLANT
SYR MEDRAD MARK V 150ML (SYRINGE) ×1 IMPLANT
TRANSDUCER W/STOPCOCK (MISCELLANEOUS) ×2 IMPLANT
TRAY PV CATH (CUSTOM PROCEDURE TRAY) ×2 IMPLANT
WIRE BENTSON .035X145CM (WIRE) ×1 IMPLANT

## 2018-10-11 NOTE — H&P (Signed)
   History and Physical Update  The patient was interviewed and re-examined.  The patient's previous History and Physical has been reviewed and is unchanged from recent office visit. Plan for left lower extremity angiography with possible intervention.   Izael Bessinger C. Donzetta Matters, MD [facility] Office: [phone] Pager: [phone]   10/11/2018, 11:56 AM

## 2018-10-11 NOTE — Progress Notes (Signed)
No bleeding or hematoma noted after ambulation 

## 2018-10-11 NOTE — Op Note (Signed)
    Patient name: Jon [name] MRN: [id number] DOB: [birth year] Sex: male  10/11/2018 Pre-operative Diagnosis: left great toe ulcer Post-operative diagnosis:  Same Surgeon:  Erlene Quan C. Donzetta Matters, MD Procedure Performed: 1.  US guided cannulation of right common femoral artery 2.  Selection of left popliteal artery with limited left leg angiogram 3.  Moderate sedation with fentanyl and versed for 13 minutes 4.  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  He has a distal tip of the left great toe ulceration tibials were incompletely evaluated prior given renal disease.  He is now indicated for angiogram possible intervention.  Findings: Only the left popliteal and trifurcation to the foot was evaluated.  There is no flow-limiting stenosis.  This is all secondary to small vessel disease.   Procedure:  The patient was identified in the holding area and taken to room 8.  The patient was then placed supine on the table and prepped and draped in the usual sterile fashion.  A time out was called.  Ultrasound was used to evaluate the right common femoral artery and this was cannulated with micropuncture needle followed by wire and sheath with direct ultrasound visualization.  Images saved the permanent record.  We placed a Bentson wire followed by 5 [place] sheath.  He is an Omni catheter and Glidewire advanced across the bifurcation.  I then used a straight catheter level to popliteal artery perform limited left lower extremity angiogram with the above findings.  I do not intervene.  Catheter was removed.  Minx device deployed.  He tolerated procedure without immediate complication.  Contrast: 15cc   Marcena Dias C. Donzetta Matters, MD [facility] Office: [phone] Pager: [phone]

## 2018-10-11 NOTE — Discharge Instructions (Signed)
Femoral Site Care °This sheet gives you information about how to care for yourself after your procedure. Your health care provider may also give you more specific instructions. If you have problems or questions, contact your health care provider. °What can I expect after the procedure? °After the procedure, it is common to have: °· Bruising that usually fades within 1-2 weeks. °· Tenderness at the site. °Follow these instructions at home: °Wound care °· Follow instructions from your health care provider about how to take care of your insertion site. Make sure you: °? Wash your hands with soap and water before you change your bandage (dressing). If soap and water are not available, use hand sanitizer. °? Change your dressing as told by your health care provider. °? Leave stitches (sutures), skin glue, or adhesive strips in place. These skin closures may need to stay in place for 2 weeks or longer. If adhesive strip edges start to loosen and curl up, you may trim the loose edges. Do not remove adhesive strips completely unless your health care provider tells you to do that. °· Do not take baths, swim, or use a hot tub until your health care provider approves. °· You may shower 24-48 hours after the procedure or as told by your health care provider. °? Gently wash the site with plain soap and water. °? Pat the area dry with a clean towel. °? Do not rub the site. This may cause bleeding. °· Do not apply powder or lotion to the site. Keep the site clean and dry. °· Check your femoral site every day for signs of infection. Check for: °? Redness, swelling, or pain. °? Fluid or blood. °? Warmth. °? Pus or a bad smell. °Activity °· For the first 2-3 days after your procedure, or as long as directed: °? Avoid climbing stairs as much as possible. °? Do not squat. °· Do not lift anything that is heavier than 10 lb (4.5 kg), or the limit that you are told, until your health care provider says that it is safe. °· Rest as  directed. °? Avoid sitting for a long time without moving. Get up to take short walks every 1-2 hours. °· Do not drive for 24 hours if you were given a medicine to help you relax (sedative). °General instructions °· Take over-the-counter and prescription medicines only as told by your health care provider. °· Keep all follow-up visits as told by your health care provider. This is important. °Contact a health care provider if you have: °· A fever or chills. °· You have redness, swelling, or pain around your insertion site. °Get help right away if: °· The catheter insertion area swells very fast. °· You pass out. °· You suddenly start to sweat or your skin gets clammy. °· The catheter insertion area is bleeding, and the bleeding does not stop when you hold steady pressure on the area. °· The area near or just beyond the catheter insertion site becomes pale, cool, tingly, or numb. °These symptoms may represent a serious problem that is an emergency. Do not wait to see if the symptoms will go away. Get medical help right away. Call your local emergency services (911 in the [place] Do not drive yourself to the hospital. °Summary °· After the procedure, it is common to have bruising that usually fades within 1-2 weeks. °· Check your femoral site every day for signs of infection. °· Do not lift anything that is heavier than 10 lb (4.5 kg), or the   limit that you are told, until your health care provider says that it is safe. °This information is not intended to replace advice given to you by your health care provider. Make sure you discuss any questions you have with your health care provider. °Document Released: 04/21/2014 Document Revised: 08/31/2017 Document Reviewed: 08/31/2017 °Elsevier Interactive Patient Education © 2019 [employer] Inc[employer] ° °

## 2018-10-11 NOTE — Progress Notes (Signed)
Pt would not state his name or birthday when asked.

## 2018-10-12 ENCOUNTER — Telehealth: Payer: Self-pay | Admitting: Vascular Surgery

## 2018-10-12 ENCOUNTER — Encounter (HOSPITAL_COMMUNITY): Payer: Self-pay | Admitting: Vascular Surgery

## 2018-10-12 MED FILL — Lidocaine HCl Local Preservative Free (PF) Inj 1%: INTRAMUSCULAR | Qty: 30 | Status: AC

## 2018-10-12 NOTE — Telephone Encounter (Signed)
sch appt spk to pt mld ltr 12/24/2018 11am RLE art 1130am p/o MD

## 2018-10-12 NOTE — Telephone Encounter (Signed)
-----   Message from Waynetta [place], MD sent at 10/11/2018  3:40 PM EST ----- Jon [name] [id number] [birth year]  10/11/2018 Pre-operative Diagnosis: left great toe ulcer  Surgeon:  Erlene Quan C. Donzetta Matters, MD  Procedure Performed: 1.  US guided cannulation of right common femoral artery 2.  Selection of left popliteal artery with limited left leg angiogram 3.  Moderate sedation with fentanyl and versed for 13 minutes 4.  Mynx device closure right common femoral artery  F/u in 2-3 months in right lower extremity duplex

## 2018-10-25 DIAGNOSIS — J9610 Chronic respiratory failure, unspecified whether with hypoxia or hypercapnia: Secondary | ICD-10-CM | POA: Diagnosis not present

## 2018-10-26 DIAGNOSIS — E1142 Type 2 diabetes mellitus with diabetic polyneuropathy: Secondary | ICD-10-CM | POA: Diagnosis not present

## 2018-10-26 DIAGNOSIS — Z89411 Acquired absence of right great toe: Secondary | ICD-10-CM | POA: Diagnosis not present

## 2018-10-26 DIAGNOSIS — I7389 Other specified peripheral vascular diseases: Secondary | ICD-10-CM | POA: Diagnosis not present

## 2018-10-26 DIAGNOSIS — I4819 Other persistent atrial fibrillation: Secondary | ICD-10-CM | POA: Diagnosis not present

## 2018-10-26 DIAGNOSIS — I119 Hypertensive heart disease without heart failure: Secondary | ICD-10-CM | POA: Diagnosis not present

## 2018-10-27 DIAGNOSIS — J9610 Chronic respiratory failure, unspecified whether with hypoxia or hypercapnia: Secondary | ICD-10-CM | POA: Diagnosis not present

## 2018-11-02 DIAGNOSIS — L03115 Cellulitis of right lower limb: Secondary | ICD-10-CM | POA: Diagnosis not present

## 2018-11-02 DIAGNOSIS — Z4781 Encounter for orthopedic aftercare following surgical amputation: Secondary | ICD-10-CM | POA: Diagnosis not present

## 2018-11-02 DIAGNOSIS — R7881 Bacteremia: Secondary | ICD-10-CM | POA: Diagnosis not present

## 2018-11-02 DIAGNOSIS — B9562 Methicillin resistant Staphylococcus aureus infection as the cause of diseases classified elsewhere: Secondary | ICD-10-CM | POA: Diagnosis not present

## 2018-11-02 DIAGNOSIS — Z89411 Acquired absence of right great toe: Secondary | ICD-10-CM | POA: Diagnosis not present

## 2018-11-03 ENCOUNTER — Ambulatory Visit (INDEPENDENT_AMBULATORY_CARE_PROVIDER_SITE_OTHER): Payer: Medicare Other | Admitting: Internal Medicine

## 2018-11-03 ENCOUNTER — Encounter: Payer: Self-pay | Admitting: Internal Medicine

## 2018-11-03 DIAGNOSIS — I96 Gangrene, not elsewhere classified: Secondary | ICD-10-CM

## 2018-11-03 NOTE — Progress Notes (Signed)
[facility]  Patient Active Problem List   Diagnosis Date Noted  . MRSA bacteremia 09/02/2018    Priority: High  . Gangrene of toe of right foot ([place]) 09/01/2018    Priority: High  . PVD (peripheral vascular disease) ([place]) 09/28/2018  . Malnutrition of moderate degree 09/03/2018  . ARF (acute renal failure) ([place]) 09/01/2018  . Cellulitis of right foot 09/01/2018  . DM (diabetes mellitus), type 2 with renal complications ([place]) 09/38/1829  . Chronic pain of both knees 03/18/2017  . Primary osteoarthritis of both knees 03/18/2017  . Hypertensive heart failure ([place]) 05/07/2016  . Preoperative cardiovascular examination 02/24/2016  . Hemoptysis 08/01/2015  . Herpes zoster 06/26/2015  . Chronic respiratory failure ([place]) 04/10/2015  . PNA (pneumonia) 04/10/2015  . Chronic diastolic heart failure ([place]) 02/22/2015  . LBBB (left bundle branch block) 02/22/2015  . Peritoneal effusion, chronic 02/22/2015  . Atrial fibrillation ([place]) 02/20/2015  . Precordial pain 01/26/2015  . OSA on CPAP 01/25/2015  . Back pain 01/25/2015  . COPD (chronic obstructive pulmonary disease) ([place]) 01/25/2015  . Abnormal EKG 11/29/2014  . Acute renal insufficiency 11/29/2014  . Hyperkalemia-repeat pending 11/29/2014  . Troponin level elevated 11/29/2014  . Normal coronary arteries 2011 11/29/2014  . Long term (current) use of anticoagulants 03/21/2014  . CVA (cerebral vascular accident) ([place]) 02/09/2014  . Chronic anticoagulation-Xarelto 02/09/2014  . BOOP (bronchiolitis obliterans with organizing pneumonia) ([place]) 01/20/2014  . Pericardial effusion 01/04/2014  . Chronic atrial fibrillation ([place]) 01/03/2014  . Chronic bronchitis ([place]) 12/21/2013  . Uncontrolled type 2 diabetes mellitus with diabetic polyneuropathy, with long-term current use of insulin ([place]) 12/21/2013  . Hypothyroidism   . Dysrhythmia   . Hypertensive heart disease with heart failure ([place])   .  History of stroke   . Restless leg syndrome   . GERD (gastroesophageal reflux disease)   . Sleep apnea   . Hyperlipidemia   . Neuropathy   . Obesity (BMI 30-39.9)   . Bariatric surgery status 11/03/2013    Patient's Medications  New Prescriptions   No medications on file  Previous Medications   ALBUTEROL (PROVENTIL HFA;VENTOLIN HFA) 108 (90 BASE) MCG/ACT INHALER    Inhale 2 puffs into the lungs every 6 (six) hours as needed for wheezing or shortness of breath.   ALBUTEROL (PROVENTIL) (2.5 MG/3ML) 0.083% NEBULIZER SOLUTION    Take 3 mLs (2.5 mg total) by nebulization 2 (two) times daily. And as needed   AMINO ACIDS-PROTEIN HYDROLYS (FEEDING SUPPLEMENT, PRO-STAT SUGAR FREE 64,) LIQD    Take 30 mLs by mouth 2 (two) times daily.   ATORVASTATIN (LIPITOR) 40 MG TABLET    Take 40 mg by mouth daily.   AZELASTINE (ASTELIN) 137 MCG/SPRAY NASAL SPRAY    Place 1 spray into both nostrils at bedtime as needed for allergies.    CLOPIDOGREL (PLAVIX) 75 MG TABLET    Take 1 tablet (75 mg total) by mouth daily with breakfast.   DICLOFENAC (VOLTAREN) 50 MG EC TABLET    Take 50 mg by mouth 2 (two) times daily.   DILTIAZEM (CARDIZEM) 120 MG TABLET    Take 120 mg by mouth daily.   FEEDING SUPPLEMENT, ENSURE ENLIVE, (ENSURE ENLIVE) LIQD    Take 237 mLs by mouth 2 (two) times daily between meals.   FERROUS SULFATE 325 (65 FE) MG TABLET    Take 325 mg by mouth daily.   FLUTICASONE-SALMETEROL (ADVAIR) 500-50 MCG/DOSE AEPB  Inhale 1 puff into the lungs every 12 (twelve) hours.   HYDRALAZINE (APRESOLINE) 100 MG TABLET    Take 100 mg by mouth at bedtime.   HYDRALAZINE (APRESOLINE) 50 MG TABLET    Take 50 mg by mouth every morning.   ISOSORBIDE MONONITRATE (IMDUR) 60 MG 24 HR TABLET    Take 60 mg by mouth every morning.   LOSARTAN (COZAAR) 100 MG TABLET    Take 100 mg by mouth daily.   METFORMIN (GLUCOPHAGE) 1000 MG TABLET    Take 1,000 mg by mouth 2 (two) times daily with a meal.   METOLAZONE (ZAROXOLYN) 2.5 MG  TABLET    Take 1 tablet (2.5 mg total) by mouth 2 (two) times a week.   METOPROLOL SUCCINATE (TOPROL-XL) 25 MG 24 HR TABLET    Take 25 mg by mouth every evening.    MOVANTIK 25 MG TABS    Take 25 mg by mouth daily.    MULTIPLE VITAMIN (MULTIVITAMIN WITH MINERALS) TABS TABLET    Take 1 tablet by mouth daily.   NITROGLYCERIN (NITROSTAT) 0.4 MG SL TABLET    Place 0.4 mg under the tongue every 5 (five) minutes as needed for chest pain.    OXYCODONE (OXYCONTIN) 15 MG 12 HR TABLET    Take 15 mg by mouth every 12 (twelve) hours.   OXYCODONE ER (XTAMPZA ER) 27 MG C12A    Take 27 mg by mouth every 12 (twelve) hours.   OXYGEN    Inhale into the lungs. 2 liters at bedtime   PANTOPRAZOLE (PROTONIX) 40 MG TABLET    Take 40 mg by mouth daily.    POTASSIUM CHLORIDE ER 20 MEQ TBCR    Take 20 mEq by mouth 3 (three) times daily.   PRAMIPEXOLE (MIRAPEX) 1.5 MG TABLET    Take 1.5 mg by mouth at bedtime.   PREDNISONE (DELTASONE) 5 MG TABLET    Take 5 mg by mouth daily with breakfast.   PREGABALIN (LYRICA) 75 MG CAPSULE    Take 1 capsule (75 mg total) by mouth 2 (two) times daily.   RANOLAZINE (RANEXA) 500 MG 12 HR TABLET    Take 500 mg by mouth 2 (two) times daily.   TOPIRAMATE (TOPAMAX) 100 MG TABLET    Take 100 mg by mouth daily.   TORSEMIDE (DEMADEX) 20 MG TABLET    Take 20 mg by mouth 2 (two) times daily.    XARELTO 15 MG TABS TABLET    TAKE 1 TABLET BY MOUTH ONCE DAILY WITH EVENING MEAL  Modified Medications   No medications on file  Discontinued Medications   No medications on file    Subjective: Jon [name] is in for his routine follow-up visit.  He was hospitalized with a right great toe ischemia and MRSA infection complicated by bacteremia.  He had no clinical or echocardiographic evidence of endocarditis and follow-up blood cultures were negative.  He underwent amputation of his right great toe and metatarsal.  He completed 3 weeks of IV vancomycin on 09/19/2018.  His PICC was removed.  He has not had any  fever, chills or sweats.  When he first went home he was under the impression that he needed to be up walking as much as possible for physical therapy.  He was having a large amount of serosanguineous drainage from his right foot incision.  When he learned that he was supposed to stay off his foot as much as possible the drainage is decreased.  He recently developed  some redness on his right lateral foot.  His daughter felt like it was caused by pressure from his boot.  Since his last visit here 1 month ago he has continued to improve without any sign of recurrent infection.  Review of Systems: Review of Systems  Constitutional: Negative for diaphoresis and fever.  Gastrointestinal: Negative for abdominal pain, diarrhea, nausea and vomiting.  Musculoskeletal: Positive for joint pain.  Skin: Negative for rash.    Past Medical History:  Diagnosis Date  . Abnormal EKG 11/29/2014  . Acute renal insufficiency 11/29/2014  . Angina   . Arthritis    "knees" (01/18/2014)  . Asthma   . Atrial fibrillation ([place])   . Back pain 01/25/2015  . 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 BOOP (bronchiolitis obliterans with organizing pneumonia) ([place]) 01/20/2014   OLBx 01/20/14 BOOP Started steroids 02/28/14   . Chronic anticoagulation-Xarelto 02/09/2014  . Chronic atrial fibrillation 01/03/2014  . Chronic bronchitis ([place]) 12/21/2013  . Chronic diastolic heart failure ([place]) 02/22/2015  . Chronic pain of both knees 03/18/2017   Overview:  Added automatically from request for surgery [id number]  . Chronic respiratory failure ([place]) 04/10/2015  . COPD (chronic obstructive pulmonary disease) ([place]) 01/25/2015  . Cough    coughing up blood- started last nite, seems to be worse now  . CVA (cerebral vascular accident) ([place]) 02/09/2014  . Dysrhythmia    atrial fib takes cardizem,   . Fibromyalgia   . GERD (gastroesophageal reflux disease)   . H/O hiatal hernia   . Hemoptysis 08/01/2015  . Herpes zoster 06/26/2015  . History of  stroke   . Hyperkalemia-repeat pending 11/29/2014  . Hyperlipemia   . Hyperlipidemia   . Hypertension   . Hypothyroidism   . IDDM (insulin dependent diabetes mellitus) ([facility])    insulin pump   . Long term (current) use of anticoagulants 03/21/2014  . Mild CAD 02/20/2015   Overview:  On recent cardiac cath  Overview:  On recent cardiac cath  . Myocardial infarction [facility])    "one dr says yes; another says no"  . Neuromuscular disorder ([facility])    rls, neuropathy  . Neuropathy    rls, neuropathy   . Normal coronary arteries 2011 11/29/2014  . Obesity (BMI 30-39.9)   . On home oxygen therapy    "2L w/CPAP at bedtime" (09/01/2018)  . OSA on CPAP    cpap & oxygen  . Pericardial effusion   . Peritoneal effusion, chronic 02/22/2015   Overview:  With surgical window  . PNA (pneumonia) 04/10/2015  . Pneumonia 5/15   "several times" (01/18/2014)  . Precordial pain 01/26/2015  . Preoperative cardiovascular examination 02/24/2016  . Primary osteoarthritis of both knees 03/18/2017   Overview:  Added automatically from request for surgery [id number]  . Restless leg syndrome   . Sleep apnea    cpap & oxygen   . Stroke [facility]) 2012   denies residual on 01/18/2014  . Troponin level elevated 11/29/2014  . Uncontrolled type 2 diabetes mellitus with diabetic polyneuropathy, with long-term current use of insulin ([place]) 12/21/2013    Social History   Tobacco Use  . Smoking status: Former Smoker    Types: Cigars    Last attempt to quit: 09/01/2006    Years since quitting: 12.1  . Smokeless tobacco: Never Used  Substance Use Topics  . 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 Drug use: No    Family History  Problem  Relation Age of Onset  . Cancer Mother   . Stroke Father   . Heart disease Father   . Seizures Sister   . Diabetes Sister   . Anesthesia problems Neg Hx   . Hypotension Neg Hx   . Malignant hyperthermia Neg Hx   . Pseudochol deficiency Neg Hx     Allergies   Allergen Reactions  . Hydrocodone Itching  . Morphine Itching  . Duloxetine Hcl Other (See Comments)  . Codeine Itching  . Penicillins Itching and Rash    DID THE REACTION INVOLVE: Swelling of the face/tongue/throat, SOB, or low BP? No Sudden or severe rash/hives, skin peeling, or the inside of the mouth or nose? No Did it require medical treatment? No When did it last happen?unknown If all above answers are "NO", may proceed with cephalosporin use.     Objective: Vitals:   11/03/18 1409  BP: (!) 151/79  Pulse: 85  Temp: 98 F (36.7 C)  Weight: 246 lb (111.6 kg)   Body mass index is 38.53 kg/m.  Physical Exam Constitutional:      Comments: He is talkative and in good spirits.  Cardiovascular:     Rate and Rhythm: Normal rate and regular rhythm.     Heart sounds: No murmur.  Pulmonary:     Effort: Pulmonary effort is normal.     Breath sounds: Normal breath sounds.  Musculoskeletal:     Comments: There is only one small area distally that is still open on his right foot incision.  The area on his lateral right foot has healed completely.  Psychiatric:        Mood and Affect: Mood normal.               Lab Results    Problem List Items Addressed This Visit      High   Gangrene of toe of right foot ([place])    His MRSA infection has been cured.  He can follow-up here as needed.          Michel Bickers, MD [facility] [phone] pager   [phone] cell 11/03/2018, 2:41 PM

## 2018-11-03 NOTE — Assessment & Plan Note (Signed)
His MRSA infection has been cured.  He can follow-up here as needed.

## 2018-11-06 DIAGNOSIS — J449 Chronic obstructive pulmonary disease, unspecified: Secondary | ICD-10-CM | POA: Diagnosis not present

## 2018-11-07 DIAGNOSIS — L03115 Cellulitis of right lower limb: Secondary | ICD-10-CM | POA: Diagnosis not present

## 2018-11-07 DIAGNOSIS — R7881 Bacteremia: Secondary | ICD-10-CM | POA: Diagnosis not present

## 2018-11-07 DIAGNOSIS — Z89411 Acquired absence of right great toe: Secondary | ICD-10-CM | POA: Diagnosis not present

## 2018-11-07 DIAGNOSIS — B9562 Methicillin resistant Staphylococcus aureus infection as the cause of diseases classified elsewhere: Secondary | ICD-10-CM | POA: Diagnosis not present

## 2018-11-07 DIAGNOSIS — Z4781 Encounter for orthopedic aftercare following surgical amputation: Secondary | ICD-10-CM | POA: Diagnosis not present

## 2018-11-08 DIAGNOSIS — I119 Hypertensive heart disease without heart failure: Secondary | ICD-10-CM | POA: Diagnosis not present

## 2018-11-08 DIAGNOSIS — R0602 Shortness of breath: Secondary | ICD-10-CM | POA: Diagnosis not present

## 2018-11-08 DIAGNOSIS — I4819 Other persistent atrial fibrillation: Secondary | ICD-10-CM | POA: Diagnosis not present

## 2018-11-08 DIAGNOSIS — E1121 Type 2 diabetes mellitus with diabetic nephropathy: Secondary | ICD-10-CM | POA: Diagnosis not present

## 2018-11-08 DIAGNOSIS — E782 Mixed hyperlipidemia: Secondary | ICD-10-CM | POA: Diagnosis not present

## 2018-11-23 DIAGNOSIS — J9610 Chronic respiratory failure, unspecified whether with hypoxia or hypercapnia: Secondary | ICD-10-CM | POA: Diagnosis not present

## 2018-11-25 DIAGNOSIS — J9610 Chronic respiratory failure, unspecified whether with hypoxia or hypercapnia: Secondary | ICD-10-CM | POA: Diagnosis not present

## 2018-12-01 DIAGNOSIS — E782 Mixed hyperlipidemia: Secondary | ICD-10-CM | POA: Diagnosis not present

## 2018-12-01 DIAGNOSIS — I4819 Other persistent atrial fibrillation: Secondary | ICD-10-CM | POA: Diagnosis not present

## 2018-12-07 DIAGNOSIS — J449 Chronic obstructive pulmonary disease, unspecified: Secondary | ICD-10-CM | POA: Diagnosis not present

## 2018-12-14 ENCOUNTER — Other Ambulatory Visit: Payer: Self-pay

## 2018-12-14 DIAGNOSIS — I96 Gangrene, not elsewhere classified: Secondary | ICD-10-CM

## 2018-12-14 DIAGNOSIS — I739 Peripheral vascular disease, unspecified: Secondary | ICD-10-CM

## 2018-12-24 ENCOUNTER — Encounter (HOSPITAL_COMMUNITY): Payer: Medicare Other

## 2018-12-24 ENCOUNTER — Encounter: Payer: Medicare Other | Admitting: Vascular Surgery

## 2018-12-24 DIAGNOSIS — J9610 Chronic respiratory failure, unspecified whether with hypoxia or hypercapnia: Secondary | ICD-10-CM | POA: Diagnosis not present

## 2018-12-26 DIAGNOSIS — J9610 Chronic respiratory failure, unspecified whether with hypoxia or hypercapnia: Secondary | ICD-10-CM | POA: Diagnosis not present

## 2018-12-30 DIAGNOSIS — E782 Mixed hyperlipidemia: Secondary | ICD-10-CM | POA: Diagnosis not present

## 2018-12-30 DIAGNOSIS — G4733 Obstructive sleep apnea (adult) (pediatric): Secondary | ICD-10-CM | POA: Diagnosis not present

## 2018-12-30 DIAGNOSIS — I4819 Other persistent atrial fibrillation: Secondary | ICD-10-CM | POA: Diagnosis not present

## 2018-12-31 DIAGNOSIS — I4819 Other persistent atrial fibrillation: Secondary | ICD-10-CM | POA: Diagnosis not present

## 2018-12-31 DIAGNOSIS — E782 Mixed hyperlipidemia: Secondary | ICD-10-CM | POA: Diagnosis not present

## 2019-01-05 DIAGNOSIS — I739 Peripheral vascular disease, unspecified: Secondary | ICD-10-CM | POA: Diagnosis not present

## 2019-01-05 DIAGNOSIS — I4891 Unspecified atrial fibrillation: Secondary | ICD-10-CM | POA: Diagnosis not present

## 2019-01-05 DIAGNOSIS — E11622 Type 2 diabetes mellitus with other skin ulcer: Secondary | ICD-10-CM | POA: Diagnosis not present

## 2019-01-05 DIAGNOSIS — L97812 Non-pressure chronic ulcer of other part of right lower leg with fat layer exposed: Secondary | ICD-10-CM | POA: Diagnosis not present

## 2019-01-05 DIAGNOSIS — G629 Polyneuropathy, unspecified: Secondary | ICD-10-CM | POA: Diagnosis not present

## 2019-01-05 DIAGNOSIS — G4730 Sleep apnea, unspecified: Secondary | ICD-10-CM | POA: Diagnosis not present

## 2019-01-05 DIAGNOSIS — M1990 Unspecified osteoarthritis, unspecified site: Secondary | ICD-10-CM | POA: Diagnosis not present

## 2019-01-05 DIAGNOSIS — J449 Chronic obstructive pulmonary disease, unspecified: Secondary | ICD-10-CM | POA: Diagnosis not present

## 2019-01-05 DIAGNOSIS — I2510 Atherosclerotic heart disease of native coronary artery without angina pectoris: Secondary | ICD-10-CM | POA: Diagnosis not present

## 2019-01-05 DIAGNOSIS — I10 Essential (primary) hypertension: Secondary | ICD-10-CM | POA: Diagnosis not present

## 2019-01-06 DIAGNOSIS — J449 Chronic obstructive pulmonary disease, unspecified: Secondary | ICD-10-CM | POA: Diagnosis not present

## 2019-01-12 DIAGNOSIS — L97812 Non-pressure chronic ulcer of other part of right lower leg with fat layer exposed: Secondary | ICD-10-CM | POA: Diagnosis not present

## 2019-01-12 DIAGNOSIS — I872 Venous insufficiency (chronic) (peripheral): Secondary | ICD-10-CM | POA: Diagnosis not present

## 2019-01-12 DIAGNOSIS — E11622 Type 2 diabetes mellitus with other skin ulcer: Secondary | ICD-10-CM | POA: Diagnosis not present

## 2019-01-12 DIAGNOSIS — I739 Peripheral vascular disease, unspecified: Secondary | ICD-10-CM | POA: Diagnosis not present

## 2019-01-19 ENCOUNTER — Telehealth: Payer: Self-pay

## 2019-01-19 DIAGNOSIS — L97812 Non-pressure chronic ulcer of other part of right lower leg with fat layer exposed: Secondary | ICD-10-CM | POA: Diagnosis not present

## 2019-01-19 DIAGNOSIS — I739 Peripheral vascular disease, unspecified: Secondary | ICD-10-CM | POA: Diagnosis not present

## 2019-01-19 DIAGNOSIS — E11622 Type 2 diabetes mellitus with other skin ulcer: Secondary | ICD-10-CM | POA: Diagnosis not present

## 2019-01-19 DIAGNOSIS — I872 Venous insufficiency (chronic) (peripheral): Secondary | ICD-10-CM | POA: Diagnosis not present

## 2019-01-19 NOTE — Telephone Encounter (Signed)
Daughter [name] called and said that pt needs to get in to be seen due to issues with his legs  Called and spoke with [name] and she said that he has numerous wounds on his legs and they are not healing. She said that they have been seeing wound care but they have given them our number and told them to call us to have the physician evaluate them again.   Appt made for pt to be seen on Friday.    York Cerise, CMA

## 2019-01-20 ENCOUNTER — Telehealth (HOSPITAL_COMMUNITY): Payer: Self-pay | Admitting: Rehabilitation

## 2019-01-20 NOTE — Telephone Encounter (Signed)
The above patient or their representative was contacted and gave the following answers to these questions:         Do you have any of the following symptoms? No  Fever                    Cough                   Shortness of breath  Do  you have any of the following other symptoms? No   muscle pain         vomiting,        diarrhea        rash         weakness        red eye        abdominal pain         bruising          bruising or bleeding              joint pain           severe headache    Have you been in contact with someone who was or has been sick in the past 2 weeks? No  Yes                 Unsure                         Unable to assess   Does the person that you were in contact with have any of the following symptoms?   Cough         shortness of breath           muscle pain         vomiting,            diarrhea            rash            weakness           fever            red eye           abdominal pain           bruising  or  bleeding                joint pain                severe headache               Have you  or someone you have been in contact with traveled internationally in th last month? No        If yes, which countries?   Have you  or someone you have been in contact with traveled outside [place] in th last month? No         If yes, which state and city?   COMMENTS OR ACTION PLAN FOR THIS PATIENT:          

## 2019-01-21 ENCOUNTER — Ambulatory Visit (HOSPITAL_COMMUNITY)
Admission: RE | Admit: 2019-01-21 | Discharge: 2019-01-21 | Disposition: A | Discharge status: Home / Self Care | Payer: Medicare Other | Source: Ambulatory Visit | Attending: Vascular Surgery | Admitting: Vascular Surgery

## 2019-01-21 ENCOUNTER — Ambulatory Visit (INDEPENDENT_AMBULATORY_CARE_PROVIDER_SITE_OTHER): Payer: Medicare Other | Admitting: Vascular Surgery

## 2019-01-21 ENCOUNTER — Other Ambulatory Visit (HOSPITAL_COMMUNITY): Payer: Self-pay | Admitting: Vascular Surgery

## 2019-01-21 ENCOUNTER — Encounter: Payer: Self-pay | Admitting: Vascular Surgery

## 2019-01-21 ENCOUNTER — Other Ambulatory Visit: Payer: Self-pay

## 2019-01-21 VITALS — BP 134/74 | HR 62 | Temp 97.8°F | Resp 20 | Ht 67.0 in | Wt 252.5 lb

## 2019-01-21 DIAGNOSIS — L97929 Non-pressure chronic ulcer of unspecified part of left lower leg with unspecified severity: Secondary | ICD-10-CM | POA: Diagnosis not present

## 2019-01-21 DIAGNOSIS — I739 Peripheral vascular disease, unspecified: Secondary | ICD-10-CM

## 2019-01-21 DIAGNOSIS — L97919 Non-pressure chronic ulcer of unspecified part of right lower leg with unspecified severity: Secondary | ICD-10-CM | POA: Insufficient documentation

## 2019-01-21 NOTE — Progress Notes (Signed)
Patient ID: Jon [name], male   DOB: 11/28/1950, 68 y.o.   MRN: [id number]  Reason for Consult: Wound Check   Referred by Rochel Brome, MD    Subjective:     Wound Check   :  Jon [name] is a 68 y.o. male history of right anterior tibial and posterior tibial intervention followed by great toe amputation.  Also had left great toe ulceration underwent left lower extremity angiography did not demonstrate any lesions for intervention.  He is on Plavix and Lipitor.  Also takes Xarelto for atrial fibrillation.  Previously had renal insufficiency on admission with creatinine greater than 3 but did return to baseline in the 1.2 range.  Now presents for evaluation of right lower extremity wounds on the lateral and medial aspect.  He has known chronic skin changes consistent with venous disease.  He has never had venous intervention nor is he had deep venous thrombosis.  ABIs were performed in February.  He has been wearing compression socks for greater than 3 months and been with wound care in [place] for the same time period.  He has associated swelling.  Does not have any wound on the left leg.  Right leg wounds he states will heal but spontaneously come back despite wearing compression and protecting his leg.  Right great toe amputation site has healed.  Left great toe ulcer has healed.  Past Medical History:  Diagnosis Date  . Abnormal EKG 11/29/2014  . Acute renal insufficiency 11/29/2014  . Angina   . Arthritis    "knees" (01/18/2014)  . Asthma   . Atrial fibrillation ([place])   . Back pain 01/25/2015  . 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 BOOP (bronchiolitis obliterans with organizing pneumonia) ([place]) 01/20/2014   OLBx 01/20/14 BOOP Started steroids 02/28/14   . Chronic anticoagulation-Xarelto 02/09/2014  . Chronic atrial fibrillation 01/03/2014  . Chronic bronchitis ([place]) 12/21/2013  . Chronic diastolic heart failure ([place]) 02/22/2015  . Chronic pain of both knees 03/18/2017   Overview:   Added automatically from request for surgery [id number]  . Chronic respiratory failure ([place]) 04/10/2015  . COPD (chronic obstructive pulmonary disease) ([place]) 01/25/2015  . Cough    coughing up blood- started last nite, seems to be worse now  . CVA (cerebral vascular accident) ([place]) 02/09/2014  . Dysrhythmia    atrial fib takes cardizem,   . Fibromyalgia   . GERD (gastroesophageal reflux disease)   . H/O hiatal hernia   . Hemoptysis 08/01/2015  . Herpes zoster 06/26/2015  . History of stroke   . Hyperkalemia-repeat pending 11/29/2014  . Hyperlipemia   . Hyperlipidemia   . Hypertension   . Hypothyroidism   . IDDM (insulin dependent diabetes mellitus) (HCC)    insulin pump   . Long term (current) use of anticoagulants 03/21/2014  . Mild CAD 02/20/2015   Overview:  On recent cardiac cath  Overview:  On recent cardiac cath  . Myocardial infarction [facility])    "one dr says yes; another says no"  . Neuromuscular disorder (HCC)    rls, neuropathy  . Neuropathy    rls, neuropathy   . Normal coronary arteries 2011 11/29/2014  . Obesity (BMI 30-39.9)   . On home oxygen therapy    "2L w/CPAP at bedtime" (09/01/2018)  . OSA on CPAP    cpap & oxygen  . Pericardial effusion   . Peritoneal effusion, chronic 02/22/2015   Overview:  With surgical window  . PNA (pneumonia) 04/10/2015  .  Pneumonia 5/15   "several times" (01/18/2014)  . Precordial pain 01/26/2015  . Preoperative cardiovascular examination 02/24/2016  . Primary osteoarthritis of both knees 03/18/2017   Overview:  Added automatically from request for surgery [id number]  . Restless leg syndrome   . Sleep apnea    cpap & oxygen   . Stroke [facility]) 2012   denies residual on 01/18/2014  . Troponin level elevated 11/29/2014  . Uncontrolled type 2 diabetes mellitus with diabetic polyneuropathy, with long-term current use of insulin ([place]) 12/21/2013   Family History  Problem Relation Age of Onset  . Cancer Mother   . Stroke Father   . Heart disease  Father   . Seizures Sister   . Diabetes Sister   . Anesthesia problems Neg Hx   . Hypotension Neg Hx   . Malignant hyperthermia Neg Hx   . Pseudochol deficiency Neg Hx    Past Surgical History:  Procedure Laterality Date  . ABDOMINAL AORTOGRAM W/LOWER EXTREMITY N/A 09/08/2018   Procedure: ABDOMINAL AORTOGRAM W/LOWER EXTREMITY;  Surgeon: Marty Heck, MD;  Location: [place] CV LAB;  Service: Cardiovascular;  Laterality: N/A;  . AMPUTATION Right 09/09/2018   Procedure: AMPUTATION RIGHT GREAT TOE;  Surgeon: Waynetta Sandy, MD;  Location: [place];  Service: Vascular;  Laterality: Right;  . APPENDECTOMY    . CARDIAC CATHETERIZATION  X 2 then 03/03/2012    NL LVF, normal coronaries, vessels are small ([facility]: Dr. Beatrix Fetters)  . CATARACT EXTRACTION W/ INTRAOCULAR LENS  IMPLANT, BILATERAL Bilateral   . CHOLECYSTECTOMY    . HERNIA REPAIR     UHR  . LAPAROSCOPIC GASTRIC BANDING  2010  . LOWER EXTREMITY ANGIOGRAPHY Left 10/11/2018   Procedure: LOWER EXTREMITY ANGIOGRAPHY;  Surgeon: Waynetta Sandy, MD;  Location: [place] CV LAB;  Service: Cardiovascular;  Laterality: Left;  . PERICARDIAL WINDOW Left 01/24/2014   Procedure: PERICARDIAL WINDOW;  Surgeon: Gaye Pollack, MD;  Location: [place];  Service: Thoracic;  Laterality: Left;  . PERIPHERAL VASCULAR INTERVENTION Right 09/08/2018   Procedure: PERIPHERAL VASCULAR INTERVENTION;  Surgeon: Marty Heck, MD;  Location: [place] CV [facility];  Service: Cardiovascular;  Laterality: Right;  . SINUS EXPLORATION  X 2  . TEE WITHOUT CARDIOVERSION N/A 09/07/2018   Procedure: TRANSESOPHAGEAL ECHOCARDIOGRAM (TEE);  Surgeon: Acie Fredrickson Wonda Cheng, MD;  Location: [place];  Service: Cardiovascular;  Laterality: N/A;  . UMBILICAL HERNIA REPAIR    . VIDEO ASSISTED THORACOSCOPY Left 01/24/2014   Procedure: VIDEO ASSISTED THORACOSCOPY;  Surgeon: Gaye Pollack, MD;  Location: [facility] OR;  Service: Thoracic;  Laterality: Left;  VATS/open lung biopsy   . VIDEO BRONCHOSCOPY Bilateral 12/29/2013   Procedure: VIDEO BRONCHOSCOPY WITHOUT FLUORO;  Surgeon: Elsie Stain, MD;  Location: [facility];  Service: Endoscopy;  Laterality: Bilateral;  . VIDEO BRONCHOSCOPY N/A 01/24/2014   Procedure: VIDEO BRONCHOSCOPY;  Surgeon: Gaye Pollack, MD;  Location: [facility] OR;  Service: Thoracic;  Laterality: N/A;    Short Social History:  Social History   Tobacco Use  . Smoking status: Former Smoker    Types: Cigars    Last attempt to quit: 09/01/2006    Years since quitting: 12.3  . Smokeless tobacco: Never Used  Substance Use Topics  . 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 Hydrocodone Itching  . Morphine Itching  . Duloxetine Hcl Other (See Comments)  . Codeine Itching  . Penicillins  Itching and Rash    DID THE REACTION INVOLVE: Swelling of the face/tongue/throat, SOB, or low BP? No Sudden or severe rash/hives, skin peeling, or the inside of the mouth or nose? No Did it require medical treatment? No When did it last happen?unknown If all above answers are "NO", may proceed with cephalosporin use.     Current Outpatient Medications  Medication Sig Dispense Refill  . atorvastatin (LIPITOR) 40 MG tablet Take 40 mg by mouth daily.    . clopidogrel (PLAVIX) 75 MG tablet Take 1 tablet (75 mg total) by mouth daily with breakfast. 30 tablet 0  . ferrous sulfate 325 (65 FE) MG tablet Take 325 mg by mouth daily.    . isosorbide mononitrate (IMDUR) 60 MG 24 hr tablet Take 60 mg by mouth every morning.    . metFORMIN (GLUCOPHAGE) 1000 MG tablet Take 1,000 mg by mouth 2 (two) times daily with a meal.    . metolazone (ZAROXOLYN) 2.5 MG tablet Take 1 tablet (2.5 mg total) by mouth 2 (two) times a week. (Patient taking differently: Take 2.5 mg by mouth every Monday, Wednesday, and Friday. ) 30 tablet 3  . metoprolol succinate (TOPROL-XL) 25 MG 24 hr tablet Take 25 mg by mouth every  evening.     Marland Kitchen MOVANTIK 25 MG TABS Take 25 mg by mouth daily.     Marland Kitchen oxyCODONE ER (XTAMPZA ER) 27 MG C12A Take 27 mg by mouth every 12 (twelve) hours.    . pantoprazole (PROTONIX) 40 MG tablet Take 40 mg by mouth daily.     . Potassium Chloride ER 20 MEQ TBCR Take 20 mEq by mouth 3 (three) times daily.    . predniSONE (DELTASONE) 5 MG tablet Take 5 mg by mouth daily with breakfast.    . pregabalin (LYRICA) 75 MG capsule Take 1 capsule (75 mg total) by mouth 2 (two) times daily. 60 capsule 0  . topiramate (TOPAMAX) 100 MG tablet Take 100 mg by mouth daily.    Marland Kitchen torsemide (DEMADEX) 20 MG tablet Take 20 mg by mouth 2 (two) times daily.     Alveda Reasons 15 MG TABS tablet TAKE 1 TABLET BY MOUTH ONCE DAILY WITH EVENING MEAL (Patient taking differently: Take 15 mg by mouth daily after supper. ) 90 tablet 2  . albuterol (PROVENTIL HFA;VENTOLIN HFA) 108 (90 BASE) MCG/ACT inhaler Inhale 2 puffs into the lungs every 6 (six) hours as needed for wheezing or shortness of breath.    Marland Kitchen albuterol (PROVENTIL) (2.5 MG/3ML) 0.083% nebulizer solution Take 3 mLs (2.5 mg total) by nebulization 2 (two) times daily. And as needed 75 mL 5  . Amino Acids-Protein Hydrolys (FEEDING SUPPLEMENT, PRO-STAT SUGAR FREE 64,) LIQD Take 30 mLs by mouth 2 (two) times daily. 887 mL 0  . azelastine (ASTELIN) 137 MCG/SPRAY nasal spray Place 1 spray into both nostrils at bedtime as needed for allergies.     Marland Kitchen diclofenac (VOLTAREN) 50 MG EC tablet Take 50 mg by mouth 2 (two) times daily.    Marland Kitchen diltiazem (CARDIZEM) 120 MG tablet Take 120 mg by mouth daily.    . feeding supplement, ENSURE ENLIVE, (ENSURE ENLIVE) LIQD Take 237 mLs by mouth 2 (two) times daily between meals. 237 mL 12  . Fluticasone-Salmeterol (ADVAIR) 500-50 MCG/DOSE AEPB Inhale 1 puff into the lungs every 12 (twelve) hours. 60 each 11  . hydrALAZINE (APRESOLINE) 100 MG tablet Take 100 mg by mouth at bedtime.    . hydrALAZINE (APRESOLINE) 50 MG tablet Take  50 mg by mouth every  morning.    Marland Kitchen losartan (COZAAR) 100 MG tablet Take 100 mg by mouth daily.    . Multiple Vitamin (MULTIVITAMIN WITH MINERALS) TABS tablet Take 1 tablet by mouth daily. 30 tablet 0  . nitroGLYCERIN (NITROSTAT) 0.4 MG SL tablet Place 0.4 mg under the tongue every 5 (five) minutes as needed for chest pain.     Marland Kitchen oxyCODONE (OXYCONTIN) 15 mg 12 hr tablet Take 15 mg by mouth every 12 (twelve) hours.    . OXYGEN Inhale into the lungs. 2 liters at bedtime    . pramipexole (MIRAPEX) 1.5 MG tablet Take 1.5 mg by mouth at bedtime.    . ranolazine (RANEXA) 500 MG 12 hr tablet Take 500 mg by mouth 2 (two) times daily.     No current facility-administered medications for this visit.     Review of Systems  Constitutional:  Constitutional negative. HENT: HENT negative.  Eyes: Eyes negative.  Respiratory: Respiratory negative.  Cardiovascular: Positive for leg swelling.  GI: Gastrointestinal negative.  Musculoskeletal: Musculoskeletal negative.  Skin: Positive for wound.  Neurological: Neurological negative. Hematologic: Hematologic/lymphatic negative.  Psychiatric: Psychiatric negative.        Objective:   Vitals:   01/21/19 0850  BP: 134/74  Pulse: 62  Resp: 20  Temp: 97.8 F (36.6 C)  SpO2: 95%    Physical Exam HENT:     Head: Normocephalic.     Nose: Nose normal.  Eyes:     Pupils: Pupils are equal, round, and reactive to light.  Cardiovascular:     Rate and Rhythm: Normal rate.  Pulmonary:     Effort: Pulmonary effort is normal.  Abdominal:     General: Abdomen is flat.     Palpations: Abdomen is soft.  Musculoskeletal: Normal range of motion.  Skin:    Capillary Refill: Capillary refill takes less than 2 seconds.     Comments: He has thickened skin and purple discoloration of his bilateral lower extremities below the knee consistent with venous insufficiency.  On the right leg laterally there is a 1 x 1 cm ulcer and medially there is a 4 x 6 cm ulcer that is actively  oozing heme.  Neurological:     General: No focal deficit present.     Mental Status: He is alert.  Psychiatric:        Mood and Affect: Mood normal.        Behavior: Behavior normal.        Thought Content: Thought content normal.        Judgment: Judgment normal.     Data: Venous insufficiency of the right lower extremity completed today after visit.  I discussed the results with him the demonstrated no superficial venous reflux on the right side.     Assessment/Plan:     68 year old male with a healed right great toe amputation and healed left great toe ulceration after arterial intervention of the right lower extremity.  Has ulceration of his right leg as well as skin changes that appear to be consistent with C5 venous disease.  Has no reflux of his right lower extremity.  We will continue wound care and compression stockings in [place].  Follow-up in 3 months with ABIs.  At some point we may need to repeat his lower extremity reflux studies but I reviewed the images discussed with the tech today it does appear that he does not have any superficial reflux for intervention at this time.  Aastha Dayley Christopher Tanina Barb MD [facility]  

## 2019-01-22 ENCOUNTER — Encounter (HOSPITAL_COMMUNITY): Payer: Medicare Other

## 2019-01-23 DIAGNOSIS — J9610 Chronic respiratory failure, unspecified whether with hypoxia or hypercapnia: Secondary | ICD-10-CM | POA: Diagnosis not present

## 2019-01-25 DIAGNOSIS — J9610 Chronic respiratory failure, unspecified whether with hypoxia or hypercapnia: Secondary | ICD-10-CM | POA: Diagnosis not present

## 2019-01-26 DIAGNOSIS — Z89411 Acquired absence of right great toe: Secondary | ICD-10-CM | POA: Diagnosis not present

## 2019-01-26 DIAGNOSIS — I739 Peripheral vascular disease, unspecified: Secondary | ICD-10-CM | POA: Diagnosis not present

## 2019-01-26 DIAGNOSIS — E782 Mixed hyperlipidemia: Secondary | ICD-10-CM | POA: Diagnosis not present

## 2019-01-26 DIAGNOSIS — I130 Hypertensive heart and chronic kidney disease with heart failure and stage 1 through stage 4 chronic kidney disease, or unspecified chronic kidney disease: Secondary | ICD-10-CM | POA: Diagnosis not present

## 2019-01-26 DIAGNOSIS — I4819 Other persistent atrial fibrillation: Secondary | ICD-10-CM | POA: Diagnosis not present

## 2019-01-26 DIAGNOSIS — E1142 Type 2 diabetes mellitus with diabetic polyneuropathy: Secondary | ICD-10-CM | POA: Diagnosis not present

## 2019-01-26 DIAGNOSIS — L97812 Non-pressure chronic ulcer of other part of right lower leg with fat layer exposed: Secondary | ICD-10-CM | POA: Diagnosis not present

## 2019-01-26 DIAGNOSIS — E11622 Type 2 diabetes mellitus with other skin ulcer: Secondary | ICD-10-CM | POA: Diagnosis not present

## 2019-01-26 DIAGNOSIS — I7389 Other specified peripheral vascular diseases: Secondary | ICD-10-CM | POA: Diagnosis not present

## 2019-01-29 DIAGNOSIS — G4733 Obstructive sleep apnea (adult) (pediatric): Secondary | ICD-10-CM | POA: Diagnosis not present

## 2019-02-03 DIAGNOSIS — I872 Venous insufficiency (chronic) (peripheral): Secondary | ICD-10-CM | POA: Diagnosis not present

## 2019-02-03 DIAGNOSIS — L97812 Non-pressure chronic ulcer of other part of right lower leg with fat layer exposed: Secondary | ICD-10-CM | POA: Diagnosis not present

## 2019-02-03 DIAGNOSIS — E11622 Type 2 diabetes mellitus with other skin ulcer: Secondary | ICD-10-CM | POA: Diagnosis not present

## 2019-02-06 DIAGNOSIS — J449 Chronic obstructive pulmonary disease, unspecified: Secondary | ICD-10-CM | POA: Diagnosis not present

## 2019-02-10 DIAGNOSIS — E11622 Type 2 diabetes mellitus with other skin ulcer: Secondary | ICD-10-CM | POA: Diagnosis not present

## 2019-02-10 DIAGNOSIS — L97812 Non-pressure chronic ulcer of other part of right lower leg with fat layer exposed: Secondary | ICD-10-CM | POA: Diagnosis not present

## 2019-02-10 DIAGNOSIS — I872 Venous insufficiency (chronic) (peripheral): Secondary | ICD-10-CM | POA: Diagnosis not present

## 2019-02-10 DIAGNOSIS — R12 Heartburn: Secondary | ICD-10-CM | POA: Diagnosis not present

## 2019-02-22 DIAGNOSIS — I872 Venous insufficiency (chronic) (peripheral): Secondary | ICD-10-CM | POA: Diagnosis not present

## 2019-02-22 DIAGNOSIS — L97822 Non-pressure chronic ulcer of other part of left lower leg with fat layer exposed: Secondary | ICD-10-CM | POA: Diagnosis not present

## 2019-02-22 DIAGNOSIS — L97812 Non-pressure chronic ulcer of other part of right lower leg with fat layer exposed: Secondary | ICD-10-CM | POA: Diagnosis not present

## 2019-02-22 DIAGNOSIS — I87312 Chronic venous hypertension (idiopathic) with ulcer of left lower extremity: Secondary | ICD-10-CM | POA: Diagnosis not present

## 2019-02-22 DIAGNOSIS — E11622 Type 2 diabetes mellitus with other skin ulcer: Secondary | ICD-10-CM | POA: Diagnosis not present

## 2019-02-23 DIAGNOSIS — J9610 Chronic respiratory failure, unspecified whether with hypoxia or hypercapnia: Secondary | ICD-10-CM | POA: Diagnosis not present

## 2019-02-25 DIAGNOSIS — J9610 Chronic respiratory failure, unspecified whether with hypoxia or hypercapnia: Secondary | ICD-10-CM | POA: Diagnosis not present

## 2019-03-03 DIAGNOSIS — L97822 Non-pressure chronic ulcer of other part of left lower leg with fat layer exposed: Secondary | ICD-10-CM | POA: Diagnosis not present

## 2019-03-03 DIAGNOSIS — E11622 Type 2 diabetes mellitus with other skin ulcer: Secondary | ICD-10-CM | POA: Diagnosis not present

## 2019-03-03 DIAGNOSIS — I872 Venous insufficiency (chronic) (peripheral): Secondary | ICD-10-CM | POA: Diagnosis not present

## 2019-03-03 DIAGNOSIS — L97819 Non-pressure chronic ulcer of other part of right lower leg with unspecified severity: Secondary | ICD-10-CM | POA: Diagnosis not present

## 2019-03-08 DIAGNOSIS — J449 Chronic obstructive pulmonary disease, unspecified: Secondary | ICD-10-CM | POA: Diagnosis not present

## 2019-03-09 ENCOUNTER — Other Ambulatory Visit: Payer: Self-pay

## 2019-03-09 DIAGNOSIS — I872 Venous insufficiency (chronic) (peripheral): Secondary | ICD-10-CM | POA: Diagnosis not present

## 2019-03-09 DIAGNOSIS — I87312 Chronic venous hypertension (idiopathic) with ulcer of left lower extremity: Secondary | ICD-10-CM | POA: Diagnosis not present

## 2019-03-09 DIAGNOSIS — L97822 Non-pressure chronic ulcer of other part of left lower leg with fat layer exposed: Secondary | ICD-10-CM | POA: Diagnosis not present

## 2019-03-09 DIAGNOSIS — E11622 Type 2 diabetes mellitus with other skin ulcer: Secondary | ICD-10-CM | POA: Diagnosis not present

## 2019-03-09 NOTE — Patient Outreach (Addendum)
[place] [facility]) Care Management  03/09/2019  DECKLYN HYDER [birth year] [id number]  Unsuccessful Outreach x 1.  Received referral on 02-23-2019 from CMA to follow up with member for complex CM due to DM and COPD.  Called member at preferred number and message stated "number has call restrictions that have prevented your call". Called secondary number and person answering stated "he's not here right now" and call was disconnected.  Will send unsuccessful outreach letter. Will call member within 3-4 business days.  Benjamine Mola "ANN" Josiah Lobo, RN-BSN  [facility] Care Management  Community Care Management Coordinator  [phone] [email or web address]

## 2019-03-14 ENCOUNTER — Other Ambulatory Visit: Payer: Self-pay

## 2019-03-14 NOTE — Patient Outreach (Signed)
[name] [name]  03/14/2019  Jon [name] 04/30/1951 [id number]   Unsuccessful Outreach x 2.  Received referral on 02-23-2019 from CMA to follow up with member for complex CM due to DM and COPD.  Called member at preferred number and message stated "number has call restrictions that have prevented your call". Called secondary number and person answering stated "he's not here right now" and call was disconnected.  Will call member within 3-4 business days.  Benjamine Mola "ANN" Josiah Lobo, RN-BSN  [facility] Care Management  Community Care Management Coordinator  [phone] [email or web address]

## 2019-03-16 DIAGNOSIS — E11622 Type 2 diabetes mellitus with other skin ulcer: Secondary | ICD-10-CM | POA: Diagnosis not present

## 2019-03-16 DIAGNOSIS — L97812 Non-pressure chronic ulcer of other part of right lower leg with fat layer exposed: Secondary | ICD-10-CM | POA: Diagnosis not present

## 2019-03-16 DIAGNOSIS — I872 Venous insufficiency (chronic) (peripheral): Secondary | ICD-10-CM | POA: Diagnosis not present

## 2019-03-17 ENCOUNTER — Other Ambulatory Visit: Payer: Self-pay

## 2019-03-17 NOTE — Patient Outreach (Signed)
[place] [facility]) Care Management  03/17/2019  Jon [name] 1951/08/24 [id number]   Unsuccessful Outreach x 3.  Received referral on 02-23-2019 from CMA to follow up with member for complex CM due toDM and COPD.  Called member at preferred number andmessage stated "number has call restrictions that have prevented your call".Called secondary number HIPPA compliant voicemail message left introducing self and requested return call.  Plan: Will place on 10 day hold per [facility] Workflow.  Benjamine Mola "ANN" Josiah Lobo, RN-BSN  [facility] Care Management  Community Care Management Coordinator  [phone] [email or web address]

## 2019-03-23 ENCOUNTER — Other Ambulatory Visit: Payer: Self-pay | Admitting: *Deleted

## 2019-03-23 NOTE — Patient Outreach (Signed)
[place] [facility]) Care Management  03/23/2019  Jon [name] 05/09/1951 [id number]   No response from member after multiple unsuccessful outreach attempts and letter sent.  Will close case at this time due to inability to maintain contact.  Will notify member and primary MD of case closure.   [name], [place], MSN [place] [phone]

## 2019-03-25 DIAGNOSIS — J9610 Chronic respiratory failure, unspecified whether with hypoxia or hypercapnia: Secondary | ICD-10-CM | POA: Diagnosis not present

## 2019-03-27 DIAGNOSIS — J9610 Chronic respiratory failure, unspecified whether with hypoxia or hypercapnia: Secondary | ICD-10-CM | POA: Diagnosis not present

## 2019-04-06 ENCOUNTER — Other Ambulatory Visit: Payer: Self-pay

## 2019-04-06 DIAGNOSIS — I739 Peripheral vascular disease, unspecified: Secondary | ICD-10-CM

## 2019-04-08 DIAGNOSIS — J449 Chronic obstructive pulmonary disease, unspecified: Secondary | ICD-10-CM | POA: Diagnosis not present

## 2019-04-21 DIAGNOSIS — I87311 Chronic venous hypertension (idiopathic) with ulcer of right lower extremity: Secondary | ICD-10-CM | POA: Diagnosis not present

## 2019-04-21 DIAGNOSIS — L97812 Non-pressure chronic ulcer of other part of right lower leg with fat layer exposed: Secondary | ICD-10-CM | POA: Diagnosis not present

## 2019-04-21 DIAGNOSIS — E11622 Type 2 diabetes mellitus with other skin ulcer: Secondary | ICD-10-CM | POA: Diagnosis not present

## 2019-04-21 DIAGNOSIS — I87331 Chronic venous hypertension (idiopathic) with ulcer and inflammation of right lower extremity: Secondary | ICD-10-CM | POA: Diagnosis not present

## 2019-04-21 DIAGNOSIS — E11620 Type 2 diabetes mellitus with diabetic dermatitis: Secondary | ICD-10-CM | POA: Diagnosis not present

## 2019-04-22 ENCOUNTER — Encounter (HOSPITAL_COMMUNITY): Payer: Medicare Other

## 2019-04-22 ENCOUNTER — Ambulatory Visit: Payer: Medicare Other | Admitting: Vascular Surgery

## 2019-04-22 ENCOUNTER — Ambulatory Visit: Payer: Medicare Other | Admitting: Family

## 2019-04-25 DIAGNOSIS — J9610 Chronic respiratory failure, unspecified whether with hypoxia or hypercapnia: Secondary | ICD-10-CM | POA: Diagnosis not present

## 2019-04-27 DIAGNOSIS — J9610 Chronic respiratory failure, unspecified whether with hypoxia or hypercapnia: Secondary | ICD-10-CM | POA: Diagnosis not present

## 2019-04-29 ENCOUNTER — Ambulatory Visit: Payer: Medicare Other | Admitting: Family

## 2019-05-26 DIAGNOSIS — J9610 Chronic respiratory failure, unspecified whether with hypoxia or hypercapnia: Secondary | ICD-10-CM | POA: Diagnosis not present

## 2019-05-28 DIAGNOSIS — J9610 Chronic respiratory failure, unspecified whether with hypoxia or hypercapnia: Secondary | ICD-10-CM | POA: Diagnosis not present

## 2019-05-31 DIAGNOSIS — H40053 Ocular hypertension, bilateral: Secondary | ICD-10-CM | POA: Diagnosis not present

## 2019-06-06 DIAGNOSIS — G4733 Obstructive sleep apnea (adult) (pediatric): Secondary | ICD-10-CM | POA: Diagnosis not present

## 2019-06-08 DIAGNOSIS — Z89411 Acquired absence of right great toe: Secondary | ICD-10-CM | POA: Diagnosis not present

## 2019-06-08 DIAGNOSIS — I7389 Other specified peripheral vascular diseases: Secondary | ICD-10-CM | POA: Diagnosis not present

## 2019-06-08 DIAGNOSIS — E782 Mixed hyperlipidemia: Secondary | ICD-10-CM | POA: Diagnosis not present

## 2019-06-08 DIAGNOSIS — I119 Hypertensive heart disease without heart failure: Secondary | ICD-10-CM | POA: Diagnosis not present

## 2019-06-08 DIAGNOSIS — E1142 Type 2 diabetes mellitus with diabetic polyneuropathy: Secondary | ICD-10-CM | POA: Diagnosis not present

## 2019-06-08 DIAGNOSIS — I130 Hypertensive heart and chronic kidney disease with heart failure and stage 1 through stage 4 chronic kidney disease, or unspecified chronic kidney disease: Secondary | ICD-10-CM | POA: Diagnosis not present

## 2019-06-08 DIAGNOSIS — I4819 Other persistent atrial fibrillation: Secondary | ICD-10-CM | POA: Diagnosis not present

## 2019-06-25 DIAGNOSIS — J9610 Chronic respiratory failure, unspecified whether with hypoxia or hypercapnia: Secondary | ICD-10-CM | POA: Diagnosis not present

## 2019-06-27 DIAGNOSIS — J9610 Chronic respiratory failure, unspecified whether with hypoxia or hypercapnia: Secondary | ICD-10-CM | POA: Diagnosis not present

## 2019-07-19 ENCOUNTER — Other Ambulatory Visit: Payer: Self-pay

## 2019-07-19 ENCOUNTER — Ambulatory Visit (INDEPENDENT_AMBULATORY_CARE_PROVIDER_SITE_OTHER): Payer: Medicare Other | Admitting: Podiatry

## 2019-07-19 DIAGNOSIS — M2042 Other hammer toe(s) (acquired), left foot: Secondary | ICD-10-CM

## 2019-07-19 DIAGNOSIS — M2041 Other hammer toe(s) (acquired), right foot: Secondary | ICD-10-CM

## 2019-07-19 DIAGNOSIS — B351 Tinea unguium: Secondary | ICD-10-CM

## 2019-07-19 DIAGNOSIS — E119 Type 2 diabetes mellitus without complications: Secondary | ICD-10-CM | POA: Diagnosis not present

## 2019-07-19 DIAGNOSIS — E1169 Type 2 diabetes mellitus with other specified complication: Secondary | ICD-10-CM | POA: Diagnosis not present

## 2019-07-19 DIAGNOSIS — Z89619 Acquired absence of unspecified leg above knee: Secondary | ICD-10-CM | POA: Diagnosis not present

## 2019-07-19 NOTE — Progress Notes (Signed)
Subjective:  Patient ID: Jon [name], male    DOB: 1951-06-09,  MRN: [id number]  Chief Complaint  Patient presents with  . Nail Problem    L thallux thick and discolored and lesion at tip of toe ( hard skin) x years; 9/10 occasional pain -pt denies drainage/redness/swellign/open skin Tx: trimmign   . Diabetes    FBS: 95 A1C: na PCP: COx 1 mo    68 y.o. male presents  for diabetic foot care. Last AMBS was 95. Reports numbness and tingling in their feet. Denies cramping in legs and thighs.  Review of Systems: Negative except as noted in the HPI. Denies N/V/F/Ch.  Past Medical History:  Diagnosis Date  . Abnormal EKG 11/29/2014  . Acute renal insufficiency 11/29/2014  . Angina   . Arthritis    "knees" (01/18/2014)  . Asthma   . Atrial fibrillation ([place])   . Back pain 01/25/2015  . 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 BOOP (bronchiolitis obliterans with organizing pneumonia) ([place]) 01/20/2014   OLBx 01/20/14 BOOP Started steroids 02/28/14   . Chronic anticoagulation-Xarelto 02/09/2014  . Chronic atrial fibrillation 01/03/2014  . Chronic bronchitis ([place]) 12/21/2013  . Chronic diastolic heart failure ([place]) 02/22/2015  . Chronic pain of both knees 03/18/2017   Overview:  Added automatically from request for surgery [id number]  . Chronic respiratory failure ([place]) 04/10/2015  . COPD (chronic obstructive pulmonary disease) ([place]) 01/25/2015  . Cough    coughing up blood- started last nite, seems to be worse now  . CVA (cerebral vascular accident) ([place]) 02/09/2014  . Dysrhythmia    atrial fib takes cardizem,   . Fibromyalgia   . GERD (gastroesophageal reflux disease)   . H/O hiatal hernia   . Hemoptysis 08/01/2015  . Herpes zoster 06/26/2015  . History of stroke   . Hyperkalemia-repeat pending 11/29/2014  . Hyperlipemia   . Hyperlipidemia   . Hypertension   . Hypothyroidism   . IDDM (insulin dependent diabetes mellitus) (HCC)    insulin pump   . Long term (current) use of anticoagulants  03/21/2014  . Mild CAD 02/20/2015   Overview:  On recent cardiac cath  Overview:  On recent cardiac cath  . Myocardial infarction [facility])    "one dr says yes; another says no"  . Neuromuscular disorder (HCC)    rls, neuropathy  . Neuropathy    rls, neuropathy   . Normal coronary arteries 2011 11/29/2014  . Obesity (BMI 30-39.9)   . On home oxygen therapy    "2L w/CPAP at bedtime" (09/01/2018)  . OSA on CPAP    cpap & oxygen  . Pericardial effusion   . Peritoneal effusion, chronic 02/22/2015   Overview:  With surgical window  . PNA (pneumonia) 04/10/2015  . Pneumonia 5/15   "several times" (01/18/2014)  . Precordial pain 01/26/2015  . Preoperative cardiovascular examination 02/24/2016  . Primary osteoarthritis of both knees 03/18/2017   Overview:  Added automatically from request for surgery [id number]  . Restless leg syndrome   . Sleep apnea    cpap & oxygen   . Stroke [facility]) 2012   denies residual on 01/18/2014  . Troponin level elevated 11/29/2014  . Uncontrolled type 2 diabetes mellitus with diabetic polyneuropathy, with long-term current use of insulin ([place]) 12/21/2013    Current Outpatient Medications:  .  albuterol (PROVENTIL HFA;VENTOLIN HFA) 108 (90 BASE) MCG/ACT inhaler, Inhale 2 puffs into the lungs every 6 (six) hours as needed for wheezing or shortness of breath., Disp: , Rfl:  .  albuterol (PROVENTIL) (2.5 MG/3ML) 0.083% nebulizer solution, Take 3 mLs (2.5 mg total) by nebulization 2 (two) times daily. And as needed (Patient not taking: Reported on 01/21/2019), Disp: 75 mL, Rfl: 5 .  Amino Acids-Protein Hydrolys (FEEDING SUPPLEMENT, PRO-STAT SUGAR FREE 64,) LIQD, Take 30 mLs by mouth 2 (two) times daily. (Patient not taking: Reported on 01/21/2019), Disp: 887 mL, Rfl: 0 .  atorvastatin (LIPITOR) 40 MG tablet, Take 40 mg by mouth daily., Disp: , Rfl:  .  azelastine (ASTELIN) 137 MCG/SPRAY nasal spray, Place 1 spray into both nostrils at bedtime as needed for allergies. , Disp: , Rfl:  .   Azelastine HCl 0.15 % SOLN, Place 2 sprays into both nostrils 2 (two) times daily., Disp: , Rfl:  .  clopidogrel (PLAVIX) 75 MG tablet, Take 1 tablet (75 mg total) by mouth daily with breakfast., Disp: 30 tablet, Rfl: 0 .  diclofenac (VOLTAREN) 50 MG EC tablet, Take 50 mg by mouth 2 (two) times daily., Disp: , Rfl:  .  diltiazem (CARDIZEM) 120 MG tablet, Take 120 mg by mouth daily., Disp: , Rfl:  .  DULERA 200-5 MCG/ACT AERO, , Disp: , Rfl:  .  famotidine (PEPCID) 20 MG tablet, , Disp: , Rfl:  .  famotidine (PEPCID) 40 MG tablet, , Disp: , Rfl:  .  feeding supplement, ENSURE ENLIVE, (ENSURE ENLIVE) LIQD, Take 237 mLs by mouth 2 (two) times daily between meals. (Patient not taking: Reported on 01/21/2019), Disp: 237 mL, Rfl: 12 .  ferrous sulfate 325 (65 FE) MG tablet, Take 325 mg by mouth daily., Disp: , Rfl:  .  Fluticasone-Salmeterol (ADVAIR) 500-50 MCG/DOSE AEPB, Inhale 1 puff into the lungs every 12 (twelve) hours. (Patient not taking: Reported on 01/21/2019), Disp: 60 each, Rfl: 11 .  hydrALAZINE (APRESOLINE) 100 MG tablet, Take 100 mg by mouth at bedtime., Disp: , Rfl:  .  hydrALAZINE (APRESOLINE) 50 MG tablet, Take 50 mg by mouth every morning., Disp: , Rfl:  .  ipratropium-albuterol (DUONEB) 0.5-2.5 (3) MG/3ML SOLN, , Disp: , Rfl:  .  isosorbide mononitrate (IMDUR) 60 MG 24 hr tablet, Take 60 mg by mouth every morning., Disp: , Rfl:  .  LEVEMIR FLEXTOUCH 100 UNIT/ML Pen, , Disp: , Rfl:  .  losartan (COZAAR) 100 MG tablet, Take 100 mg by mouth daily., Disp: , Rfl:  .  metFORMIN (GLUCOPHAGE) 1000 MG tablet, Take 1,000 mg by mouth 2 (two) times daily with a meal., Disp: , Rfl:  .  metolazone (ZAROXOLYN) 2.5 MG tablet, Take 1 tablet (2.5 mg total) by mouth 2 (two) times a week. (Patient taking differently: Take 2.5 mg by mouth every Monday, Wednesday, and Friday. ), Disp: 30 tablet, Rfl: 3 .  metoprolol succinate (TOPROL-XL) 25 MG 24 hr tablet, Take 25 mg by mouth every evening. , Disp: , Rfl:   .  MOVANTIK 25 MG TABS, Take 25 mg by mouth daily. , Disp: , Rfl:  .  Multiple Vitamin (MULTIVITAMIN WITH MINERALS) TABS tablet, Take 1 tablet by mouth daily. (Patient not taking: Reported on 01/21/2019), Disp: 30 tablet, Rfl: 0 .  nitroGLYCERIN (NITROSTAT) 0.4 MG SL tablet, Place 0.4 mg under the tongue every 5 (five) minutes as needed for chest pain. , Disp: , Rfl:  .  oxyCODONE (OXYCONTIN) 15 mg 12 hr tablet, Take 15 mg by mouth every 12 (twelve) hours., Disp: , Rfl:  .  oxyCODONE ER (XTAMPZA ER) 27 MG C12A, Take 27 mg by mouth every 12 (twelve) hours., Disp: ,  Rfl:  .  OXYGEN, Inhale into the lungs. 2 liters at bedtime, Disp: , Rfl:  .  pantoprazole (PROTONIX) 40 MG tablet, Take 40 mg by mouth daily. , Disp: , Rfl:  .  Potassium Chloride ER 20 MEQ TBCR, Take 20 mEq by mouth 3 (three) times daily., Disp: , Rfl:  .  potassium chloride SA (KLOR-CON) 20 MEQ tablet, , Disp: , Rfl:  .  pramipexole (MIRAPEX) 1.5 MG tablet, Take 1.5 mg by mouth at bedtime., Disp: , Rfl:  .  predniSONE (DELTASONE) 5 MG tablet, Take 5 mg by mouth daily with breakfast., Disp: , Rfl:  .  pregabalin (LYRICA) 75 MG capsule, Take 1 capsule (75 mg total) by mouth 2 (two) times daily., Disp: 60 capsule, Rfl: 0 .  ranolazine (RANEXA) 500 MG 12 hr tablet, Take 500 mg by mouth 2 (two) times daily., Disp: , Rfl:  .  RELISTOR 150 MG TABS, Take 3 tablets by mouth every morning., Disp: , Rfl:  .  topiramate (TOPAMAX) 100 MG tablet, Take 100 mg by mouth daily., Disp: , Rfl:  .  torsemide (DEMADEX) 20 MG tablet, Take 20 mg by mouth 2 (two) times daily. , Disp: , Rfl:  .  XARELTO 15 MG TABS tablet, TAKE 1 TABLET BY MOUTH ONCE DAILY WITH EVENING MEAL (Patient taking differently: Take 15 mg by mouth daily after supper. ), Disp: 90 tablet, Rfl: 2  Social History   Tobacco Use  Smoking Status Former Smoker  . Types: Cigars  . Quit date: 09/01/2006  . Years since quitting: 12.8  Smokeless Tobacco Never Used    Allergies  Allergen  Reactions  . Hydrocodone Itching  . Morphine Itching  . Duloxetine Hcl Other (See Comments)  . Codeine Itching  . Penicillins Itching and Rash    DID THE REACTION INVOLVE: Swelling of the face/tongue/throat, SOB, or low BP? No Sudden or severe rash/hives, skin peeling, or the inside of the mouth or nose? No Did it require medical treatment? No When did it last happen?unknown If all above answers are "NO", may proceed with cephalosporin use.    Objective:  There were no vitals filed for this visit. There is no height or weight on file to calculate BMI. Constitutional Well developed. Well nourished.  Vascular Dorsalis pedis pulses present 1+ bilaterally  Posterior tibial pulses present 1+ bilaterally  Pedal hair growth absent. Capillary refill normal to all digits.  No cyanosis or clubbing noted.  Neurologic Normal speech. Oriented to person, place, and time. Epicritic sensation to light touch grossly present bilaterally. Protective sensation with 5.07 monofilament  absent bilaterally.  Dermatologic Nails elongated, thickened, dystrophic. Left hallux transverse ridging. No open wounds. No skin lesions.  Orthopedic: Normal joint ROM without pain or crepitus bilaterally. Right hallux ampuation, medial deviation right foot digits with hammertoe deformity. No bony tenderness.   Assessment:   1. 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 Onychomycosis   3. Encounter for diabetic foot exam [facility])    Plan:  Patient was evaluated and treated and all questions answered.  Diabetes with amputation hx, Onychomycosis -Educated on diabetic footcare. Diabetic risk level 3 -Nails x9 debrided sharply and manually with large nail nipper and rotary burr.   Procedure: Nail Debridement Rationale: Patient meets criteria for routine foot care due to amputaiton hx Type of Debridement: manual, sharp debridement. Instrumentation: Nail nipper,  rotary burr. Number of Nails: 9   Return in about 3 months (around 10/19/2019)  for Diabetic Foot Care.

## 2019-07-20 DIAGNOSIS — E1142 Type 2 diabetes mellitus with diabetic polyneuropathy: Secondary | ICD-10-CM | POA: Diagnosis not present

## 2019-07-20 DIAGNOSIS — I130 Hypertensive heart and chronic kidney disease with heart failure and stage 1 through stage 4 chronic kidney disease, or unspecified chronic kidney disease: Secondary | ICD-10-CM | POA: Diagnosis not present

## 2019-07-20 DIAGNOSIS — I10 Essential (primary) hypertension: Secondary | ICD-10-CM | POA: Diagnosis not present

## 2019-07-26 DIAGNOSIS — J9610 Chronic respiratory failure, unspecified whether with hypoxia or hypercapnia: Secondary | ICD-10-CM | POA: Diagnosis not present

## 2019-07-28 DIAGNOSIS — J9610 Chronic respiratory failure, unspecified whether with hypoxia or hypercapnia: Secondary | ICD-10-CM | POA: Diagnosis not present

## 2019-08-01 DIAGNOSIS — N184 Chronic kidney disease, stage 4 (severe): Secondary | ICD-10-CM | POA: Diagnosis not present

## 2019-08-25 DIAGNOSIS — J9610 Chronic respiratory failure, unspecified whether with hypoxia or hypercapnia: Secondary | ICD-10-CM | POA: Diagnosis not present

## 2019-08-27 DIAGNOSIS — J9610 Chronic respiratory failure, unspecified whether with hypoxia or hypercapnia: Secondary | ICD-10-CM | POA: Diagnosis not present

## 2019-09-13 DIAGNOSIS — E559 Vitamin D deficiency, unspecified: Secondary | ICD-10-CM | POA: Diagnosis not present

## 2019-09-13 DIAGNOSIS — I130 Hypertensive heart and chronic kidney disease with heart failure and stage 1 through stage 4 chronic kidney disease, or unspecified chronic kidney disease: Secondary | ICD-10-CM | POA: Diagnosis not present

## 2019-09-13 DIAGNOSIS — G894 Chronic pain syndrome: Secondary | ICD-10-CM | POA: Diagnosis not present

## 2019-09-13 DIAGNOSIS — I4819 Other persistent atrial fibrillation: Secondary | ICD-10-CM | POA: Diagnosis not present

## 2019-09-13 DIAGNOSIS — E1142 Type 2 diabetes mellitus with diabetic polyneuropathy: Secondary | ICD-10-CM | POA: Diagnosis not present

## 2019-09-13 DIAGNOSIS — I7389 Other specified peripheral vascular diseases: Secondary | ICD-10-CM | POA: Diagnosis not present

## 2019-09-13 DIAGNOSIS — E782 Mixed hyperlipidemia: Secondary | ICD-10-CM | POA: Diagnosis not present

## 2019-09-25 DIAGNOSIS — J9610 Chronic respiratory failure, unspecified whether with hypoxia or hypercapnia: Secondary | ICD-10-CM | POA: Diagnosis not present

## 2019-09-27 DIAGNOSIS — J9610 Chronic respiratory failure, unspecified whether with hypoxia or hypercapnia: Secondary | ICD-10-CM | POA: Diagnosis not present

## 2019-10-07 ENCOUNTER — Other Ambulatory Visit: Payer: Self-pay | Admitting: Family Medicine

## 2019-10-07 ENCOUNTER — Telehealth: Payer: Self-pay

## 2019-10-07 MED ORDER — MOVANTIK 25 MG PO TABS: 25.0000 mg | ORAL_TABLET | Freq: Every day | ORAL | 5 refills | Status: DC

## 2019-10-07 MED ORDER — XTAMPZA ER 27 MG PO C12A: 27.0000 mg | EXTENDED_RELEASE_CAPSULE | Freq: Two times a day (BID) | ORAL | 0 refills | Status: DC

## 2019-10-07 NOTE — Telephone Encounter (Signed)
Patient called and requested refills on his Movantik and Xtampa to [facility].

## 2019-10-10 ENCOUNTER — Other Ambulatory Visit: Payer: Self-pay | Admitting: Family Medicine

## 2019-10-10 MED ORDER — MOVANTIK 25 MG PO TABS: 25.0000 mg | ORAL_TABLET | Freq: Every day | ORAL | 5 refills | Status: DC

## 2019-10-10 MED ORDER — XTAMPZA ER 27 MG PO C12A: 27.0000 mg | EXTENDED_RELEASE_CAPSULE | Freq: Two times a day (BID) | ORAL | 0 refills | Status: DC

## 2019-10-11 ENCOUNTER — Encounter: Payer: Self-pay | Admitting: Family Medicine

## 2019-10-11 ENCOUNTER — Other Ambulatory Visit: Payer: Self-pay

## 2019-10-11 ENCOUNTER — Ambulatory Visit (INDEPENDENT_AMBULATORY_CARE_PROVIDER_SITE_OTHER): Payer: Medicare Other | Admitting: Family Medicine

## 2019-10-11 VITALS — BP 142/64 | HR 98 | Temp 97.8°F | Ht 67.0 in | Wt 263.8 lb

## 2019-10-11 DIAGNOSIS — R600 Localized edema: Secondary | ICD-10-CM

## 2019-10-11 DIAGNOSIS — I739 Peripheral vascular disease, unspecified: Secondary | ICD-10-CM | POA: Diagnosis not present

## 2019-10-11 DIAGNOSIS — E559 Vitamin D deficiency, unspecified: Secondary | ICD-10-CM

## 2019-10-11 DIAGNOSIS — E1142 Type 2 diabetes mellitus with diabetic polyneuropathy: Secondary | ICD-10-CM | POA: Diagnosis not present

## 2019-10-11 HISTORY — DX: Vitamin D deficiency, unspecified: E55.9

## 2019-10-11 NOTE — Progress Notes (Signed)
Subjective:  Patient ID: Jon [name], male    DOB: [birth year]  Age: 69 y.o. MRN: [id number]  Chief Complaint  Patient presents with  . Hypertension    1 month f/u  . Diabetes    Hypertension This is a chronic problem. The current episode started more than 1 year ago. The problem has been waxing and waning since onset. The problem is uncontrolled. Risk factors for coronary artery disease include diabetes mellitus, dyslipidemia and sedentary lifestyle. Past treatments include beta blockers, diuretics and direct vasodilators. The current treatment provides mild improvement. Compliance problems include diet and exercise.  Hypertensive end-organ damage includes kidney disease, CVA and PVD. Identifiable causes of hypertension include chronic renal disease and sleep apnea.  Diabetes He presents for his follow-up diabetic visit. He has type 2 diabetes mellitus. His disease course has been fluctuating. Diabetic complications include a CVA and PVD. Risk factors for coronary artery disease include male sex, obesity, diabetes mellitus, dyslipidemia, sedentary lifestyle and tobacco exposure. Current diabetic treatment includes insulin injections. He is compliant with treatment most of the time. He is following a generally unhealthy diet. He never participates in exercise. His home blood glucose trend is fluctuating dramatically (Blood glucose 70s in am; 200-300s mid-day). An ACE inhibitor/angiotensin II receptor blocker is not being taken. He does not see a podiatrist. I increased the dose of his Levemir to 40 U in am and 30 U in pm insulin at his last visit as his sugars were low in am and way too high at lunch and in the evening. . Pedal edema: Patient has suffered from severe pedal edema for years.  It got significantly worse at his last visit.  He had draining/oozing ulcers on his legs.  In the past he has gone to wound care but does not wish to go.  It is very expensive.  He dresses his own wounds with  antibacterial ointment and Bandages.  Last visit I increased his metolazone to 2.5 mg 1 daily Monday through Friday with instructions to take 3 to 5 hours prior to his torsemide.  Today he presents with improvement in his swelling.  He also has fewer leaking ulcers.  Social Hx   Social History   Socioeconomic History  . Marital status: Widowed    Spouse name: Not on file  . Number of children: Not on file  . Years of education: Not on file  . Highest education level: Not on file  Occupational History  . Not on file  Tobacco Use  . Smoking status: Former Smoker    Types: Cigars    Quit date: 09/01/2006    Years since quitting: 13.1  . Smokeless tobacco: Never Used  Substance and Sexual Activity  . 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 Drug use: No  . Sexual activity: Never  Other Topics Concern  . Not on file  Social History Narrative  . Not on file   Social Determinants of Health   Financial Resource Strain:   . Difficulty of Paying Living Expenses: Not on file  Food Insecurity:   . Worried About Charity fundraiser in the Last Year: Not on file  . Ran Out of Food in the Last Year: Not on file  Transportation Needs:   . Lack of Transportation (Medical): Not on file  . Lack of Transportation (Non-Medical): Not on file  Physical Activity:   . Days of Exercise per Week: Not  on file  . Minutes of Exercise per Session: Not on file  Stress:   . Feeling of Stress : Not on file  Social Connections:   . Frequency of Communication with Friends and Family: Not on file  . Frequency of Social Gatherings with Friends and Family: Not on file  . Attends Religious Services: Not on file  . Active Member of Clubs or Organizations: Not on file  . Attends Archivist Meetings: Not on file  . Marital Status: Not on file   Past Medical History:  Diagnosis Date  . Abnormal EKG 11/29/2014  . Acute renal insufficiency 11/29/2014  . Angina   .  Arthritis    "knees" (01/18/2014)  . Asthma   . Atrial fibrillation ([place])   . Back pain 01/25/2015  . 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 BOOP (bronchiolitis obliterans with organizing pneumonia) ([place]) 01/20/2014   OLBx 01/20/14 BOOP Started steroids 02/28/14   . Chronic anticoagulation-Xarelto 02/09/2014  . Chronic atrial fibrillation ([place]) 01/03/2014  . Chronic bronchitis ([place]) 12/21/2013  . Chronic diastolic heart failure ([place]) 02/22/2015  . Chronic pain of both knees 03/18/2017   Overview:  Added automatically from request for surgery [id number]  . Chronic respiratory failure ([place]) 04/10/2015  . COPD (chronic obstructive pulmonary disease) ([place]) 01/25/2015  . Cough    coughing up blood- started last nite, seems to be worse now  . CVA (cerebral vascular accident) ([place]) 02/09/2014  . Dysrhythmia    atrial fib takes cardizem,   . Fibromyalgia   . GERD (gastroesophageal reflux disease)   . H/O hiatal hernia   . Hemoptysis 08/01/2015  . Herpes zoster 06/26/2015  . History of stroke   . Hyperkalemia-repeat pending 11/29/2014  . Hyperlipemia   . Hyperlipidemia   . Hypertension   . Hypothyroidism   . IDDM (insulin dependent diabetes mellitus)    insulin pump   . Long term (current) use of anticoagulants 03/21/2014  . Mild CAD 02/20/2015   Overview:  On recent cardiac cath  Overview:  On recent cardiac cath  . Myocardial infarction [facility])    "one dr says yes; another says no"  . Neuromuscular disorder (HCC)    rls, neuropathy  . Neuropathy    rls, neuropathy   . Normal coronary arteries 2011 11/29/2014  . Obesity (BMI 30-39.9)   . On home oxygen therapy    "2L w/CPAP at bedtime" (09/01/2018)  . OSA on CPAP    cpap & oxygen  . Pericardial effusion   . Peritoneal effusion, chronic 02/22/2015   Overview:  With surgical window  . PNA (pneumonia) 04/10/2015  . Pneumonia 5/15   "several times" (01/18/2014)  . Precordial pain 01/26/2015  . Preoperative cardiovascular examination 02/24/2016  . Primary  osteoarthritis of both knees 03/18/2017   Overview:  Added automatically from request for surgery [id number]  . Restless leg syndrome   . Sleep apnea    cpap & oxygen   . Stroke [facility]) 2012   denies residual on 01/18/2014  . Troponin level elevated 11/29/2014  . Uncontrolled type 2 diabetes mellitus with diabetic polyneuropathy, with long-term current use of insulin ([place]) 12/21/2013   The patient has a family history of  Review of Systems  Constitutional: Negative for chills.  HENT: Negative.   Eyes: Negative for pain, discharge and visual disturbance.  Respiratory:       With activity  Cardiovascular: Positive for leg swelling.  Gastrointestinal: Negative for diarrhea, nausea and vomiting.  Endocrine: Negative.  Musculoskeletal: Positive for arthralgias.  Skin: Positive for wound.       Bilateral lower extremities  Allergic/Immunologic: Negative.   Psychiatric/Behavioral: Positive for agitation.       And irritability     Objective:  BP (!) 142/64 (BP Location: Right Arm, Patient Position: Sitting)   Pulse 98   Temp 97.8 F (36.6 C) (Temporal)   Ht '5\' 7"'  (1.702 m)   Wt 263 lb 12.8 oz (119.7 kg)   SpO2 98%   BMI 41.32 kg/m   BP/Weight 10/11/2019 4/69/6295 10/09/4130  Systolic BP 440 102 725  Diastolic BP 64 74 79  Wt. (Lbs) 263.8 252.5 246  BMI 41.32 39.55 38.53    Physical Exam Vitals reviewed.  Constitutional:      Appearance: He is obese.  HENT:     Head: Normocephalic.     Right Ear: Tympanic membrane normal.     Left Ear: Tympanic membrane normal.     Nose: Nose normal.     Mouth/Throat:     Mouth: Mucous membranes are moist.  Eyes:     Pupils: Pupils are equal, round, and reactive to light.  Cardiovascular:     Rate and Rhythm: Normal rate. Rhythm irregular.  Pulmonary:     Effort: Pulmonary effort is normal.     Breath sounds: Normal breath sounds.  Abdominal:     General: Bowel sounds are normal.     Palpations: Abdomen is soft.     Tenderness: There  is no abdominal tenderness.  Musculoskeletal:     Cervical back: Neck supple.     Right lower leg: Edema present.     Left lower leg: Edema present.  Skin:    General: Skin is warm.     Findings: Erythema present.     Comments: Bilateral lower extremities with multiple small weeping ulcers that were covered with large bandaids. Skin thick, shriveled and erythematous. Non-tender to touch.   Neurological:     Mental Status: He is alert and oriented to person, place, and time.  Psychiatric:     Comments: Irritability noted.      Lab Results  Component Value Date   WBC 10.1 09/10/2018   HGB 11.6 (L) 10/11/2018   HCT 34.0 (L) 10/11/2018   PLT 284 09/10/2018   GLUCOSE 197 (H) 10/11/2018   CHOL 91 01/31/2014   TRIG 69 01/31/2014   HDL 31 (L) 01/31/2014   LDLCALC 46 01/31/2014   ALT 32 09/01/2018   AST 58 (H) 09/01/2018   NA 141 10/11/2018   K 3.8 10/11/2018   CL 110 09/11/2018   CREATININE 1.60 (H) 10/11/2018   BUN 16 09/11/2018   CO2 23 09/11/2018   TSH 0.736 02/01/2014   INR 1.68 09/01/2018   HGBA1C 8.1 (H) 01/31/2014      Assessment & Plan:   Problem List Items Addressed This Visit      Cardiovascular and Mediastinum   PVD (peripheral vascular disease) ([place])   Relevant Medications   hydrochlorothiazide (HYDRODIURIL) 25 MG tablet   metoprolol succinate (TOPROL-XL) 50 MG 24 hr tablet     Endocrine   Diabetic polyneuropathy associated with type 2 diabetes mellitus (HCC) - Primary    Increase Levemir to 45 units [place] in am and decrease Levemir to 25 units [place] in evening.      Relevant Medications   Glucagon, rDNA, (GLUCAGON EMERGENCY) 1 MG KIT     Other   Vitamin D insufficiency    Vitamin D lab drawn  today.       Relevant Orders   VITAMIN D 25 Hydroxy (Vit-D Deficiency, Fractures) (Completed)   Localized edema      Diabetic polyneuropathy associated with type 2 diabetes mellitus (HCC) Increase Levemir to 45 units [place] in am and decrease Levemir to 25 units  [place] in evening.  Vitamin D insufficiency Vitamin D lab drawn today.    No orders of the defined types were placed in this encounter.   AN INDIVIDUALIZED CARE PLAN: was established or reinforced today.   The patient's disease status was assessed using clinical findings on exam, labs, and/or other diagnostic testing, such as xrays, to determine his/her success in meeting treatment goals based on disease specific evidence-based guidelines and found to be well controlled.   SELF MANAGEMENT: The patient and I together assessed ways to personally work towards obtaining the recommended goals  Support needs The patient and/or family needs were assessed and services were offered and not necessary at this time.    Follow-up: Return in about 2 months (around 12/09/2019).  A CLINICAL SUMMARY including a written plan identify barriers to care unique to individual due to social or financial issues and help create solutions together. and a patient's and the patient's families understanding of their medical issues and care needs   [place] [phone]

## 2019-10-11 NOTE — Patient Instructions (Addendum)
Levemir Increase to 45 U in am and 25 U in pm.  Continue all other medicines as prescribed.  Work on Diet.  Diabetes Mellitus and Nutrition, Adult When you have diabetes (diabetes mellitus), it is very important to have healthy eating habits because your blood sugar (glucose) levels are greatly affected by what you eat and drink. Eating healthy foods in the appropriate amounts, at about the same times every day, can help you:  Control your blood glucose.  Lower your risk of heart disease.  Improve your blood pressure.  Reach or maintain a healthy weight. Every person with diabetes is different, and each person has different needs for a meal plan. Your health care provider may recommend that you work with a diet and nutrition specialist (dietitian) to make a meal plan that is best for you. Your meal plan may vary depending on factors such as:  The calories you need.  The medicines you take.  Your weight.  Your blood glucose, blood pressure, and cholesterol levels.  Your activity level.  Other health conditions you have, such as heart or kidney disease. How do carbohydrates affect me? Carbohydrates, also called carbs, affect your blood glucose level more than any other type of food. Eating carbs naturally raises the amount of glucose in your blood. Carb counting is a method for keeping track of how many carbs you eat. Counting carbs is important to keep your blood glucose at a healthy level, especially if you use insulin or take certain oral diabetes medicines. It is important to know how many carbs you can safely have in each meal. This is different for every person. Your dietitian can help you calculate how many carbs you should have at each meal and for each snack. Foods that contain carbs include:  Bread, cereal, rice, pasta, and crackers.  Potatoes and corn.  Peas, beans, and lentils.  Milk and yogurt.  Fruit and juice.  Desserts, such as cakes, cookies, ice cream, and  candy. How does alcohol affect me? Alcohol can cause a sudden decrease in blood glucose (hypoglycemia), especially if you use insulin or take certain oral diabetes medicines. Hypoglycemia can be a life-threatening condition. Symptoms of hypoglycemia (sleepiness, dizziness, and confusion) are similar to symptoms of having too much alcohol. If your health care provider says that alcohol is safe for you, follow these guidelines:  Limit alcohol intake to no more than 1 drink per day for nonpregnant women and 2 drinks per day for men. One drink equals 12 oz of beer, 5 oz of wine, or 1 oz of hard liquor.  Do not drink on an empty stomach.  Keep yourself hydrated with water, diet soda, or unsweetened iced tea.  Keep in mind that regular soda, juice, and other mixers may contain a lot of sugar and must be counted as carbs. What are tips for following this plan?  Reading food labels  Start by checking the serving size on the "Nutrition Facts" label of packaged foods and drinks. The amount of calories, carbs, fats, and other nutrients listed on the label is based on one serving of the item. Many items contain more than one serving per package.  Check the total grams (g) of carbs in one serving. You can calculate the number of servings of carbs in one serving by dividing the total carbs by 15. For example, if a food has 30 g of total carbs, it would be equal to 2 servings of carbs.  Check the number of grams (g)  of saturated and trans fats in one serving. Choose foods that have low or no amount of these fats.  Check the number of milligrams (mg) of salt (sodium) in one serving. Most people should limit total sodium intake to less than 2,300 mg per day.  Always check the nutrition information of foods labeled as "low-fat" or "nonfat". These foods may be higher in added sugar or refined carbs and should be avoided.  Talk to your dietitian to identify your daily goals for nutrients listed on the  label. Shopping  Avoid buying canned, premade, or processed foods. These foods tend to be high in fat, sodium, and added sugar.  Shop around the outside edge of the grocery store. This includes fresh fruits and vegetables, bulk grains, fresh meats, and fresh dairy. Cooking  Use low-heat cooking methods, such as baking, instead of high-heat cooking methods like deep frying.  Cook using healthy oils, such as olive, canola, or sunflower oil.  Avoid cooking with butter, cream, or high-fat meats. Meal planning  Eat meals and snacks regularly, preferably at the same times every day. Avoid going long periods of time without eating.  Eat foods high in fiber, such as fresh fruits, vegetables, beans, and whole grains. Talk to your dietitian about how many servings of carbs you can eat at each meal.  Eat 4-6 ounces (oz) of lean protein each day, such as lean meat, chicken, fish, eggs, or tofu. One oz of lean protein is equal to: ? 1 oz of meat, chicken, or fish. ? 1 egg. ?  cup of tofu.  Eat some foods each day that contain healthy fats, such as avocado, nuts, seeds, and fish. Lifestyle  Check your blood glucose regularly.  Exercise regularly as told by your health care provider. This may include: ? 150 minutes of moderate-intensity or vigorous-intensity exercise each week. This could be brisk walking, biking, or water aerobics. ? Stretching and doing strength exercises, such as yoga or weightlifting, at least 2 times a week.  Take medicines as told by your health care provider.  Do not use any products that contain nicotine or tobacco, such as cigarettes and e-cigarettes. If you need help quitting, ask your health care provider.  Work with a Social worker or diabetes educator to identify strategies to manage stress and any emotional and social challenges. Questions to ask a health care provider  Do I need to meet with a diabetes educator?  Do I need to meet with a dietitian?  What  number can I call if I have questions?  When are the best times to check my blood glucose? Where to find more information:  American Diabetes Association: diabetes.org  Academy of Nutrition and Dietetics: www.eatright.[employer] of Diabetes and Digestive and Kidney Diseases (NIH): [employer] Summary  A healthy meal plan will help you control your blood glucose and maintain a healthy lifestyle.  Working with a diet and nutrition specialist (dietitian) can help you make a meal plan that is best for you.  Keep in mind that carbohydrates (carbs) and alcohol have immediate effects on your blood glucose levels. It is important to count carbs and to use alcohol carefully. This information is not intended to replace advice given to you by your health care provider. Make sure you discuss any questions you have with your health care provider. Document Revised: 07/31/2017 Document Reviewed: 09/22/2016 Elsevier Patient Education  2020 [employer].

## 2019-10-12 DIAGNOSIS — Z03818 Encounter for observation for suspected exposure to other biological agents ruled out: Secondary | ICD-10-CM | POA: Diagnosis not present

## 2019-10-12 LAB — VITAMIN D 25 HYDROXY (VIT D DEFICIENCY, FRACTURES): Vit D, 25-Hydroxy: 61.8 ng/mL (ref 30.0–100.0)

## 2019-10-12 NOTE — Assessment & Plan Note (Signed)
Increase Levemir to 45 units [place] in am and decrease Levemir to 25 units  in evening.

## 2019-10-12 NOTE — Assessment & Plan Note (Signed)
Vitamin D lab drawn today.

## 2019-10-14 ENCOUNTER — Other Ambulatory Visit: Payer: Medicare Other

## 2019-10-18 DIAGNOSIS — R600 Localized edema: Secondary | ICD-10-CM | POA: Insufficient documentation

## 2019-10-19 ENCOUNTER — Other Ambulatory Visit: Payer: Self-pay | Admitting: Family Medicine

## 2019-10-24 ENCOUNTER — Ambulatory Visit (INDEPENDENT_AMBULATORY_CARE_PROVIDER_SITE_OTHER): Payer: Medicare Other | Admitting: Podiatry

## 2019-10-24 ENCOUNTER — Other Ambulatory Visit: Payer: Self-pay

## 2019-10-24 DIAGNOSIS — E1169 Type 2 diabetes mellitus with other specified complication: Secondary | ICD-10-CM

## 2019-10-24 DIAGNOSIS — Z89619 Acquired absence of unspecified leg above knee: Secondary | ICD-10-CM | POA: Diagnosis not present

## 2019-10-24 DIAGNOSIS — B351 Tinea unguium: Secondary | ICD-10-CM

## 2019-10-24 NOTE — Progress Notes (Signed)
  Subjective:  Patient ID: Jon [name], male    DOB: 12-01-1950,  MRN: [id number]  Chief Complaint  Patient presents with  . debride    diabetic nail trimming  . Diabetes    FBs: 136 A1C: na PCP: COx x 1 mo -pt states Rt 2nd toenail turned black after his last pedal trimming    69 y.o. male presents with the above complaint. History confirmed with patient.   Objective:  Physical Exam: warm, good capillary refill, nail exam onychomycosis of the toenails, no trophic changes or ulcerative lesions. DP pulses palpable, PT pulses palpable and protective sensation absent  Absent right hallux. Hammertoes bilat  No images are attached to the encounter.  Assessment:   1. Onychomycosis   2. 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  Onychomycosis, Diabetes and DPN -Patient is diabetic with a qualifying condition for at risk foot care.  Procedure: Nail Debridement Rationale: Patient meets criteria for routine foot care due to Amputation Hx Type of Debridement: manual, sharp debridement. Instrumentation: Nail nipper, rotary burr. Number of Nails: 9  Return in about 3 months (around 01/21/2020) for Diabetic Foot Care.

## 2019-10-26 DIAGNOSIS — J9610 Chronic respiratory failure, unspecified whether with hypoxia or hypercapnia: Secondary | ICD-10-CM | POA: Diagnosis not present

## 2019-10-28 DIAGNOSIS — J9610 Chronic respiratory failure, unspecified whether with hypoxia or hypercapnia: Secondary | ICD-10-CM | POA: Diagnosis not present

## 2019-11-06 DIAGNOSIS — J449 Chronic obstructive pulmonary disease, unspecified: Secondary | ICD-10-CM | POA: Diagnosis not present

## 2019-11-11 ENCOUNTER — Other Ambulatory Visit: Payer: Self-pay

## 2019-11-11 ENCOUNTER — Ambulatory Visit (INDEPENDENT_AMBULATORY_CARE_PROVIDER_SITE_OTHER): Payer: Medicare Other | Admitting: Legal Medicine

## 2019-11-11 ENCOUNTER — Encounter: Payer: Self-pay | Admitting: Legal Medicine

## 2019-11-11 VITALS — BP 136/72 | HR 78 | Temp 98.3°F | Resp 17 | Ht 67.0 in | Wt 259.0 lb

## 2019-11-11 DIAGNOSIS — I824Z1 Acute embolism and thrombosis of unspecified deep veins of right distal lower extremity: Secondary | ICD-10-CM

## 2019-11-11 DIAGNOSIS — L03115 Cellulitis of right lower limb: Secondary | ICD-10-CM

## 2019-11-11 DIAGNOSIS — L02415 Cutaneous abscess of right lower limb: Secondary | ICD-10-CM | POA: Diagnosis not present

## 2019-11-11 DIAGNOSIS — I63419 Cerebral infarction due to embolism of unspecified middle cerebral artery: Secondary | ICD-10-CM

## 2019-11-11 DIAGNOSIS — M7989 Other specified soft tissue disorders: Secondary | ICD-10-CM | POA: Diagnosis not present

## 2019-11-11 DIAGNOSIS — S81801A Unspecified open wound, right lower leg, initial encounter: Secondary | ICD-10-CM | POA: Diagnosis not present

## 2019-11-11 MED ORDER — CLINDAMYCIN HCL 150 MG PO CAPS: 150.0000 mg | ORAL_CAPSULE | Freq: Three times a day (TID) | ORAL | 1 refills | Status: DC

## 2019-11-11 MED ORDER — KETOROLAC TROMETHAMINE 60 MG/2ML IM SOLN
60.0000 mg | Freq: Once | INTRAMUSCULAR | Status: AC
  Administered 2019-11-11: 60 mg via INTRAMUSCULAR

## 2019-11-11 MED ORDER — CEFTRIAXONE SODIUM 1 G IJ SOLR
1.0000 g | Freq: Once | INTRAMUSCULAR | Status: AC
  Administered 2019-11-11: 1 g via INTRAMUSCULAR

## 2019-11-11 NOTE — Progress Notes (Signed)
 Acute Office Visit  Subjective:    Patient ID: Jon [name], male    DOB: 02/14/1951, 69 y.o.   MRN: [id number]  Chief Complaint  Patient presents with  . Cellulitis    Right Lower leg since saturday    HPI Patient is in today for fall one week ago on gravel driveway.  Since his right leg is red and swollen with weeping. He has had 2 stokes and is on xarelto for anticoagulant.  H still ay have DVT with his severe calf pain.  He is on oxycodone for pain.  Needs hoe health to redress area and PT and OT with his falls from weakness and stroke.    Past Medical History:  Diagnosis Date  . Abnormal EKG 11/29/2014  . Acute renal insufficiency 11/29/2014  . Angina   . Arthritis    "knees" (01/18/2014)  . Asthma   . Atrial fibrillation (HCC)   . Back pain 01/25/2015  . 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 Chronic anticoagulation-Xarelto 02/09/2014  . Chronic atrial fibrillation (HCC) 01/03/2014  . Chronic bronchitis (HCC) 12/21/2013  . Chronic diastolic heart failure (HCC) 02/22/2015  . Chronic pain of both knees 03/18/2017   Overview:  Added automatically from request for surgery [id number]  . Chronic respiratory failure (HCC) 04/10/2015  . COPD (chronic obstructive pulmonary disease) (HCC) 01/25/2015  . Cough    coughing up blood- started last nite, seems to be worse now  . CVA (cerebral vascular accident) (HCC) 02/09/2014  . Dysrhythmia    atrial fib takes cardizem,   . Fibromyalgia   . GERD (gastroesophageal reflux disease)   . H/O hiatal hernia   . Hemoptysis 08/01/2015  . Herpes zoster 06/26/2015  . History of stroke   . Hyperkalemia-repeat pending 11/29/2014  . Hyperlipemia   . Hyperlipidemia   . Hypertension   . Hypothyroidism   . IDDM (insulin dependent diabetes mellitus)    insulin pump   . Long term (current) use of anticoagulants 03/21/2014  . Mild  CAD 02/20/2015   Overview:  On recent cardiac cath  Overview:  On recent cardiac cath  . Myocardial infarction (HCC)    "one dr says yes; another says no"  . Neuromuscular disorder (HCC)    rls, neuropathy  . Neuropathy    rls, neuropathy   . Normal coronary arteries 2011 11/29/2014  . Obesity (BMI 30-39.9)   . On home oxygen therapy    "2L w/CPAP at bedtime" (09/01/2018)  . OSA on CPAP    cpap & oxygen  . Pericardial effusion   . Peritoneal effusion, chronic 02/22/2015   Overview:  With surgical window  . PNA (pneumonia) 04/10/2015  . Pneumonia 5/15   "several times" (01/18/2014)  . Precordial pain 01/26/2015  . Preoperative cardiovascular examination 02/24/2016  . Primary osteoarthritis of both knees 03/18/2017   Overview:  Added automatically from request for surgery [id number]  . Restless leg syndrome   . Sleep apnea    cpap & oxygen   . Stroke (HCC) 2012   denies residual on 01/18/2014  . Troponin level elevated 11/29/2014  . Uncontrolled type 2 diabetes mellitus with diabetic polyneuropathy, with long-term current use of insulin (HCC) 12/21/2013    Past Surgical History:  Procedure Laterality Date  . ABDOMINAL AORTOGRAM W/LOWER EXTREMITY N/A 09/08/2018   Procedure: ABDOMINAL AORTOGRAM W/LOWER EXTREMITY;  Surgeon: Clark, Christopher J, MD;    Location: [facility];  Service: Cardiovascular;  Laterality: N/A;  . AMPUTATION Right 09/09/2018   Procedure: AMPUTATION RIGHT GREAT TOE;  Surgeon: Cain, Brandon Christopher, MD;  Location: [facility] OR;  Service: Vascular;  Laterality: Right;  . APPENDECTOMY    . CARDIAC CATHETERIZATION  X 2 then 03/03/2012    NL LVF, normal coronaries, vessels are small ([facility]: Dr. McGukin)  . CATARACT EXTRACTION W/ INTRAOCULAR LENS  IMPLANT, BILATERAL Bilateral   . CHOLECYSTECTOMY    . HERNIA REPAIR     UHR  . LAPAROSCOPIC GASTRIC BANDING  2010  . LOWER EXTREMITY ANGIOGRAPHY Left 10/11/2018   Procedure: LOWER EXTREMITY ANGIOGRAPHY;  Surgeon: Cain, Brandon  Christopher, MD;  Location: [facility];  Service: Cardiovascular;  Laterality: Left;  . PERICARDIAL WINDOW Left 01/24/2014   Procedure: PERICARDIAL WINDOW;  Surgeon: Bryan K Bartle, MD;  Location: [facility];  Service: Thoracic;  Laterality: Left;  . PERIPHERAL VASCULAR INTERVENTION Right 09/08/2018   Procedure: PERIPHERAL VASCULAR INTERVENTION;  Surgeon: Clark, Christopher J, MD;  Location: [facility];  Service: Cardiovascular;  Laterality: Right;  . SINUS EXPLORATION  X 2  . TEE WITHOUT CARDIOVERSION N/A 09/07/2018   Procedure: TRANSESOPHAGEAL ECHOCARDIOGRAM (TEE);  Surgeon: Nahser, Philip J, MD;  Location: [facility];  Service: Cardiovascular;  Laterality: N/A;  . UMBILICAL HERNIA REPAIR    . VIDEO ASSISTED THORACOSCOPY Left 01/24/2014   Procedure: VIDEO ASSISTED THORACOSCOPY;  Surgeon: Bryan K Bartle, MD;  Location: [facility];  Service: Thoracic;  Laterality: Left;  VATS/open lung biopsy  . VIDEO BRONCHOSCOPY Bilateral 12/29/2013   Procedure: VIDEO BRONCHOSCOPY WITHOUT FLUORO;  Surgeon: Patrick E Winebarger, MD;  Location: [facility];  Service: Endoscopy;  Laterality: Bilateral;  . VIDEO BRONCHOSCOPY N/A 01/24/2014   Procedure: VIDEO BRONCHOSCOPY;  Surgeon: Bryan K Bartle, MD;  Location: [facility];  Service: Thoracic;  Laterality: N/A;    Family History  Problem Relation Age of Onset  . Cancer Mother   . Stroke Father   . Heart disease Father   . Seizures Sister   . Diabetes Sister   . Anesthesia problems Neg Hx   . Hypotension Neg Hx   . Malignant hyperthermia Neg Hx   . Pseudochol deficiency Neg Hx     Social History   Socioeconomic History  . Marital status: Widowed    Spouse name: Not on file  . Number of children: Not on file  . Years of education: Not on file  . Highest education level: Not on file  Occupational History  . Not on file  Tobacco Use  . Smoking status: Former Smoker    Types: Cigars    Quit date: 09/01/2006    Years since quitting: 13.2  . Smokeless  tobacco: Never Used  Substance and Sexual Activity  . 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 Drug use: No  . Sexual activity: Never  Other Topics Concern  . Not on file  Social History Narrative  . Not on file   Social Determinants of Health   Financial Resource Strain:   . Difficulty of Paying Living Expenses:   Food Insecurity:   . Worried About Running Out of Food in the Last Year:   . Ran Out of Food in the Last Year:   Transportation Needs:   . Lack of Transportation (Medical):   . Lack of Transportation (Non-Medical):   Physical Activity:   . Days of Exercise per Week:   .   Minutes of Exercise per Session:   Stress:   . Feeling of Stress :   Social Connections:   . Frequency of Communication with Friends and Family:   . Frequency of Social Gatherings with Friends and Family:   . Attends Religious Services:   . Active Member of Clubs or Organizations:   . Attends Club or Organization Meetings:   . Marital Status:   Intimate Partner Violence:   . Fear of Current or Ex-Partner:   . Emotionally Abused:   . Physically Abused:   . Sexually Abused:     Outpatient Medications Prior to Visit  Medication Sig Dispense Refill  . albuterol (PROVENTIL) (2.5 MG/3ML) 0.083% nebulizer solution Take 3 mLs (2.5 mg total) by nebulization 2 (two) times daily. And as needed 75 mL 5  . atorvastatin (LIPITOR) 40 MG tablet Take 40 mg by mouth daily.    . Azelastine HCl 0.15 % SOLN Place 2 sprays into both nostrils 2 (two) times daily.    . BD PEN NEEDLE NANO U/F 32G X 4 MM MISC     . clopidogrel (PLAVIX) 75 MG tablet Take 1 tablet (75 mg total) by mouth daily with breakfast. 30 tablet 0  . docusate sodium (COLACE) 100 MG capsule Take 100 mg by mouth 2 (two) times daily.    . DULERA 200-5 MCG/ACT AERO     . famotidine (PEPCID) 20 MG tablet Take 20 mg by mouth 2 (two) times daily.     . ferrous sulfate 325 (65 FE) MG tablet Take 325 mg by mouth daily.     . Glucagon, rDNA, (GLUCAGON EMERGENCY) 1 MG KIT     . hydrochlorothiazide (HYDRODIURIL) 25 MG tablet Take 25 mg by mouth daily.    . ipratropium-albuterol (DUONEB) 0.5-2.5 (3) MG/3ML SOLN     . isosorbide mononitrate (IMDUR) 120 MG 24 hr tablet Take 120 mg by mouth daily.    . latanoprost (XALATAN) 0.005 % ophthalmic solution Administer 1 drop to both eyes nightly.    . LEVEMIR FLEXTOUCH 100 UNIT/ML Pen 43 units in the a.m. 23 units in the afternoon    . metolazone (ZAROXOLYN) 2.5 MG tablet Take 1 tablet (2.5 mg total) by mouth 2 (two) times a week. (Patient taking differently: Take 2.5 mg by mouth every Monday, Wednesday, and Friday. ) 30 tablet 3  . metoprolol succinate (TOPROL-XL) 50 MG 24 hr tablet Take 50 mg by mouth daily.    . MOVANTIK 25 MG TABS tablet SMARTSIG:1 Tablet(s) By Mouth Daily    . nitroGLYCERIN (NITROSTAT) 0.4 MG SL tablet Place 0.4 mg under the tongue every 5 (five) minutes as needed for chest pain.     . oxyCODONE (OXYCONTIN) 15 mg 12 hr tablet Take 15 mg by mouth every 12 (twelve) hours.    . OXYGEN Inhale into the lungs. 2 liters at bedtime    . pantoprazole (PROTONIX) 40 MG tablet Take 40 mg by mouth daily.     . Potassium Chloride ER 20 MEQ TBCR Take 20 mEq by mouth 3 (three) times daily.    . pregabalin (LYRICA) 75 MG capsule Take 1 capsule (75 mg total) by mouth 2 (two) times daily. 60 capsule 0  . RELISTOR 150 MG TABS Take 3 tablets by mouth every morning.    . Vitamin D, Ergocalciferol, (DRISDOL) 1.25 MG (50000 UNIT) CAPS capsule TAKE 1 CAPSULE BY MOUTH DAILY 90 capsule 1  . XARELTO 15 MG TABS tablet TAKE 1 TABLET BY MOUTH ONCE DAILY WITH   EVENING MEAL (Patient taking differently: Take 15 mg by mouth daily after supper. ) 90 tablet 2  . isosorbide mononitrate (IMDUR) 60 MG 24 hr tablet Take 120 mg by mouth every morning.     . potassium chloride SA (KLOR-CON) 20 MEQ tablet Take by mouth.     No facility-administered medications prior to visit.    Allergies    Allergen Reactions  . Hydrocodone Itching  . Morphine Itching  . Duloxetine Hcl Other (See Comments)  . Codeine Itching  . Penicillins Itching and Rash    DID THE REACTION INVOLVE: Swelling of the face/tongue/throat, SOB, or low BP? No Sudden or severe rash/hives, skin peeling, or the inside of the mouth or nose? No Did it require medical treatment? No When did it last happen?unknown If all above answers are "NO", may proceed with cephalosporin use.     Review of Systems  Constitutional: Negative.   HENT: Negative.   Eyes: Negative.   Cardiovascular: Negative.   Gastrointestinal: Negative.   Endocrine: Negative.   Genitourinary: Negative.   Musculoskeletal: Positive for arthralgias and gait problem.  Skin: Negative.   Psychiatric/Behavioral: Negative.        Objective:    Physical Exam Vitals and nursing note reviewed.  Constitutional:      Appearance: Normal appearance.  HENT:     Head: Normocephalic and atraumatic.     Nose: Nose normal.  Cardiovascular:     Rate and Rhythm: Normal rate and regular rhythm.     Pulses: Normal pulses.     Heart sounds: Normal heart sounds.  Pulmonary:     Effort: Pulmonary effort is normal.  Musculoskeletal:        General: Tenderness and signs of injury present.     Cervical back: Normal range of motion and neck supple.       Legs:     Comments: Right leg is red and swollen with diffuse ulceration LE.  Very tender calf with positive homans.  Neurological:     Mental Status: He is alert. Mental status is at baseline.     BP 136/72 (BP Location: Left Arm, Patient Position: Sitting)   Pulse 78   Temp 98.3 F (36.8 C)   Resp 17   Ht 5' 7" (1.702 m)   Wt 259 lb (117.5 kg)   BMI 40.57 kg/m  Wt Readings from Last 3 Encounters:  11/11/19 259 lb (117.5 kg)  10/11/19 263 lb 12.8 oz (119.7 kg)  01/21/19 252 lb 8 oz (114.5 kg)    Health Maintenance Due  Topic Date Due  . Hepatitis C Screening  Never done  .  OPHTHALMOLOGY EXAM  Never done  . URINE MICROALBUMIN  Never done  . TETANUS/TDAP  Never done  . COLONOSCOPY  Never done  . HEMOGLOBIN A1C  08/02/2014  . PNA vac Low Risk Adult (2 of 2 - PCV13) 12/01/2014  . FOOT EXAM  02/28/2016  . INFLUENZA VACCINE  04/02/2019    There are no preventive care reminders to display for this patient.   Lab Results  Component Value Date   TSH 0.736 02/01/2014   Lab Results  Component Value Date   WBC 10.1 09/10/2018   HGB 11.6 (L) 10/11/2018   HCT 34.0 (L) 10/11/2018   MCV 89.6 09/10/2018   PLT 284 09/10/2018   Lab Results  Component Value Date   NA 141 10/11/2018   K 3.8 10/11/2018   CO2 23 09/11/2018   GLUCOSE 197 (H) 10/11/2018     BUN 16 09/11/2018   CREATININE 1.60 (H) 10/11/2018   BILITOT 1.5 (H) 09/01/2018   ALKPHOS 137 (H) 09/01/2018   AST 58 (H) 09/01/2018   ALT 32 09/01/2018   PROT 6.6 09/01/2018   ALBUMIN 2.5 (L) 09/01/2018   CALCIUM 8.4 (L) 09/11/2018   ANIONGAP 6 09/11/2018   Lab Results  Component Value Date   CHOL 91 01/31/2014   Lab Results  Component Value Date   HDL 31 (L) 01/31/2014   Lab Results  Component Value Date   LDLCALC 46 01/31/2014   Lab Results  Component Value Date   TRIG 69 01/31/2014   Lab Results  Component Value Date   CHOLHDL 2.9 01/31/2014   Lab Results  Component Value Date   HGBA1C 8.1 (H) 01/31/2014       Assessment & Plan:   Problem List Items Addressed This Visit      Cardiovascular and Mediastinum   CVA (cerebral vascular accident) [facility])    Patient was evaluated using information from exam, tests and other diagnostic studies to perform evidence-based treatment for this disorder.  Opimizing treatment and improvement of neurologic deficits.  Patient is using cane to maintain as much independence as possible. Patient using cane.  Patient has limited ability to perform ADLs.      Relevant Medications   isosorbide mononitrate (IMDUR) 120 MG 24 hr tablet   Other Relevant  Orders   Ambulatory referral to [facility]   Acute venous embolism and thrombosis of deep vessels of distal lower extremity (HCC)   Relevant Medications   isosorbide mononitrate (IMDUR) 120 MG 24 hr tablet   Other Relevant Orders   VAS [place] LOWER EXT SAPHENOUS VEIN MAPPING     Other   Cellulitis of right leg    The right leg is red, swollen and has multiple stage 2 ulcers.  These were cultured.  Area dressed with silvadene and venous dopplers performed for possible DVT despite anticoagulation.  Home health nurse will change dressing 2 times a week, follow up one week, clindamycin used since patient sensitive to penicillin, Rocephin 1gm IM given and 18m toradol for pain.         Other Visit Diagnoses    Cellulitis and abscess of right leg    -  Primary   Relevant Medications   cefTRIAXone (ROCEPHIN) injection 1 g (Completed)   ketorolac (TORADOL) injection 60 mg (Completed)   clindamycin (CLEOCIN) 150 MG capsule   Other Relevant Orders   [place]  Medications  . cefTRIAXone (ROCEPHIN) injection 1 g  . ketorolac (TORADOL) injection 60 mg  . clindamycin (CLEOCIN) 150 MG capsule    Sig: Take 1 capsule (150 mg total) by mouth 3 (three) times daily.    Dispense:  30 capsule    Refill:  1     LReinaldo Meeker MD

## 2019-11-11 NOTE — Patient Instructions (Signed)
Edema  Edema is when you have too much fluid in your body or under your skin. Edema may make your legs, feet, and ankles swell up. Swelling is also common in looser tissues, like around your eyes. This is a common condition. It gets more common as you get older. There are many possible causes of edema. Eating too much salt (sodium) and being on your feet or sitting for a long time can cause edema in your legs, feet, and ankles. Hot weather may make edema worse. Edema is usually painless. Your skin may look swollen or shiny. Follow these instructions at home:  Keep the swollen body part raised (elevated) above the level of your heart when you are sitting or lying down.  Do not sit still or stand for a long time.  Do not wear tight clothes. Do not wear garters on your upper legs.  Exercise your legs. This can help the swelling go down.  Wear elastic bandages or support stockings as told by your doctor.  Eat a low-salt (low-sodium) diet to reduce fluid as told by your doctor.  Depending on the cause of your swelling, you may need to limit how much fluid you drink (fluid restriction).  Take over-the-counter and prescription medicines only as told by your doctor. Contact a doctor if:  Treatment is not working.  You have heart, liver, or kidney disease and have symptoms of edema.  You have sudden and unexplained weight gain. Get help right away if:  You have shortness of breath or chest pain.  You cannot breathe when you lie down.  You have pain, redness, or warmth in the swollen areas.  You have heart, liver, or kidney disease and get edema all of a sudden.  You have a fever and your symptoms get worse all of a sudden. Summary  Edema is when you have too much fluid in your body or under your skin.  Edema may make your legs, feet, and ankles swell up. Swelling is also common in looser tissues, like around your eyes.  Raise (elevate) the swollen body part above the level of your  heart when you are sitting or lying down.  Follow your doctor's instructions about diet and how much fluid you can drink (fluid restriction). This information is not intended to replace advice given to you by your health care provider. Make sure you discuss any questions you have with your health care provider. Document Revised: 08/21/2017 Document Reviewed: 09/05/2016 Elsevier Patient Education  2020 Elsevier Inc.  

## 2019-11-12 ENCOUNTER — Other Ambulatory Visit: Payer: Self-pay | Admitting: Family Medicine

## 2019-11-13 DIAGNOSIS — I824Z9 Acute embolism and thrombosis of unspecified deep veins of unspecified distal lower extremity: Secondary | ICD-10-CM

## 2019-11-13 HISTORY — DX: Acute embolism and thrombosis of unspecified deep veins of unspecified distal lower extremity: I82.4Z9

## 2019-11-13 NOTE — Assessment & Plan Note (Signed)
Patient was evaluated using information from exam, tests and other diagnostic studies to perform evidence-based treatment for this disorder.  Opimizing treatment and improvement of neurologic deficits.  Patient is using cane to maintain as much independence as possible. Patient using cane.  Patient has limited ability to perform ADLs.

## 2019-11-13 NOTE — Assessment & Plan Note (Signed)
The right leg is red, swollen and has multiple stage 2 ulcers.  These were cultured.  Area dressed with silvadene and venous dopplers performed for possible DVT despite anticoagulation.  Home health nurse will change dressing 2 times a week, follow up one week, clindamycin used since patient sensitive to penicillin, Rocephin 1gm IM given and 60mg  toradol for pain.

## 2019-11-14 ENCOUNTER — Encounter: Payer: Self-pay | Admitting: Family Medicine

## 2019-11-14 ENCOUNTER — Other Ambulatory Visit: Payer: Self-pay | Admitting: Legal Medicine

## 2019-11-14 ENCOUNTER — Telehealth: Payer: Self-pay

## 2019-11-14 DIAGNOSIS — L03115 Cellulitis of right lower limb: Secondary | ICD-10-CM

## 2019-11-14 LAB — WOUND CULTURE

## 2019-11-14 MED ORDER — SULFAMETHOXAZOLE-TRIMETHOPRIM 800-160 MG PO TABS: 1.0000 | ORAL_TABLET | Freq: Two times a day (BID) | ORAL | 1 refills | Status: DC

## 2019-11-14 NOTE — Progress Notes (Signed)
Culture demonstrated the leg infection is MRSA- stop clindimycin and start bactrim ds.  This was called in lp

## 2019-11-14 NOTE — Telephone Encounter (Signed)
Letter written and up front for patient to pick up or may tell us where to fax. Kc

## 2019-11-14 NOTE — Telephone Encounter (Signed)
Patient is requesting a prescription for a walk in shower or some type of solution to the problem of him falling every time he takes a shower.

## 2019-11-15 NOTE — Telephone Encounter (Signed)
Patient notified and stated that he would rather come and pick up the note.

## 2019-11-17 ENCOUNTER — Ambulatory Visit (INDEPENDENT_AMBULATORY_CARE_PROVIDER_SITE_OTHER): Payer: Medicare Other | Admitting: Legal Medicine

## 2019-11-17 ENCOUNTER — Other Ambulatory Visit: Payer: Self-pay

## 2019-11-17 ENCOUNTER — Encounter: Payer: Self-pay | Admitting: Legal Medicine

## 2019-11-17 DIAGNOSIS — L03115 Cellulitis of right lower limb: Secondary | ICD-10-CM | POA: Diagnosis not present

## 2019-11-17 NOTE — Progress Notes (Signed)
Established Patient Office Visit  Subjective:  Patient ID: Jon [name], male    DOB: 1950-11-14  Age: 69 y.o. MRN: [id number]  CC:  Chief Complaint  Patient presents with  . Cellulitis and abcess of right leg    HPI Jon [name] presents for follow up of right leg cellulitis.  The venous dopplers were negative.  The home health nurse has not yet come out since they are having problem with nursing service that takes his insurance.  The legs remain tender and some bleeding.  He is on bactrim DS for MRSA infection.  Past Medical History:  Diagnosis Date  . Abnormal EKG 11/29/2014  . Acute renal insufficiency 11/29/2014  . Angina   . Arthritis    "knees" (01/18/2014)  . Asthma   . Atrial fibrillation ([place])   . Back pain 01/25/2015  . 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 Chronic anticoagulation-Xarelto 02/09/2014  . Chronic atrial fibrillation ([place]) 01/03/2014  . Chronic bronchitis ([place]) 12/21/2013  . Chronic diastolic heart failure ([place]) 02/22/2015  . Chronic pain of both knees 03/18/2017   Overview:  Added automatically from request for surgery [id number]  . Chronic respiratory failure ([place]) 04/10/2015  . COPD (chronic obstructive pulmonary disease) ([place]) 01/25/2015  . Cough    coughing up blood- started last nite, seems to be worse now  . CVA (cerebral vascular accident) ([place]) 02/09/2014  . Dysrhythmia    atrial fib takes cardizem,   . Fibromyalgia   . GERD (gastroesophageal reflux disease)   . H/O hiatal hernia   . Hemoptysis 08/01/2015  . Herpes zoster 06/26/2015  . History of stroke   . Hyperkalemia-repeat pending 11/29/2014  . Hyperlipemia   . Hyperlipidemia   . Hypertension   . Hypothyroidism   . IDDM (insulin dependent diabetes mellitus)    insulin pump   . Long term (current) use of anticoagulants 03/21/2014  . Mild CAD 02/20/2015   Overview:  On  recent cardiac cath  Overview:  On recent cardiac cath  . Myocardial infarction [facility])    "one dr says yes; another says no"  . Neuromuscular disorder (HCC)    rls, neuropathy  . Neuropathy    rls, neuropathy   . Normal coronary arteries 2011 11/29/2014  . Obesity (BMI 30-39.9)   . On home oxygen therapy    "2L w/CPAP at bedtime" (09/01/2018)  . OSA on CPAP    cpap & oxygen  . Pericardial effusion   . Peritoneal effusion, chronic 02/22/2015   Overview:  With surgical window  . PNA (pneumonia) 04/10/2015  . Pneumonia 5/15   "several times" (01/18/2014)  . Precordial pain 01/26/2015  . Preoperative cardiovascular examination 02/24/2016  . Primary osteoarthritis of both knees 03/18/2017   Overview:  Added automatically from request for surgery [id number]  . Restless leg syndrome   . Sleep apnea    cpap & oxygen   . Stroke [facility]) 2012   denies residual on 01/18/2014  . Troponin level elevated 11/29/2014  . Uncontrolled type 2 diabetes mellitus with diabetic polyneuropathy, with long-term current use of insulin ([place]) 12/21/2013    Past Surgical History:  Procedure Laterality Date  . ABDOMINAL AORTOGRAM W/LOWER EXTREMITY N/A 09/08/2018   Procedure: ABDOMINAL AORTOGRAM W/LOWER EXTREMITY;  Surgeon: Marty Heck, MD;  Location: [place] CV LAB;  Service: Cardiovascular;  Laterality: N/A;  . AMPUTATION Right 09/09/2018  Procedure: AMPUTATION RIGHT GREAT TOE;  Surgeon: Waynetta Sandy, MD;  Location: [employer];  Service: Vascular;  Laterality: Right;  . APPENDECTOMY    . CARDIAC CATHETERIZATION  X 2 then 03/03/2012    NL LVF, normal coronaries, vessels are small ([facility]: Dr. Beatrix Fetters)  . CATARACT EXTRACTION W/ INTRAOCULAR LENS  IMPLANT, BILATERAL Bilateral   . CHOLECYSTECTOMY    . HERNIA REPAIR     UHR  . LAPAROSCOPIC GASTRIC BANDING  2010  . LOWER EXTREMITY ANGIOGRAPHY Left 10/11/2018   Procedure: LOWER EXTREMITY ANGIOGRAPHY;  Surgeon: Waynetta Sandy, MD;  Location: [place]  CV LAB;  Service: Cardiovascular;  Laterality: Left;  . PERICARDIAL WINDOW Left 01/24/2014   Procedure: PERICARDIAL WINDOW;  Surgeon: Gaye Pollack, MD;  Location: [place];  Service: Thoracic;  Laterality: Left;  . PERIPHERAL VASCULAR INTERVENTION Right 09/08/2018   Procedure: PERIPHERAL VASCULAR INTERVENTION;  Surgeon: Marty Heck, MD;  Location: [place] CV LAB;  Service: Cardiovascular;  Laterality: Right;  . SINUS EXPLORATION  X 2  . TEE WITHOUT CARDIOVERSION N/A 09/07/2018   Procedure: TRANSESOPHAGEAL ECHOCARDIOGRAM (TEE);  Surgeon: Acie Fredrickson Wonda Cheng, MD;  Location: [place];  Service: Cardiovascular;  Laterality: N/A;  . UMBILICAL HERNIA REPAIR    . VIDEO ASSISTED THORACOSCOPY Left 01/24/2014   Procedure: VIDEO ASSISTED THORACOSCOPY;  Surgeon: Gaye Pollack, MD;  Location: [facility] OR;  Service: Thoracic;  Laterality: Left;  VATS/open lung biopsy  . VIDEO BRONCHOSCOPY Bilateral 12/29/2013   Procedure: VIDEO BRONCHOSCOPY WITHOUT FLUORO;  Surgeon: Elsie Stain, MD;  Location: [facility];  Service: Endoscopy;  Laterality: Bilateral;  . VIDEO BRONCHOSCOPY N/A 01/24/2014   Procedure: VIDEO BRONCHOSCOPY;  Surgeon: Gaye Pollack, MD;  Location: [facility] OR;  Service: Thoracic;  Laterality: N/A;    Family History  Problem Relation Age of Onset  . Cancer Mother   . Stroke Father   . Heart disease Father   . Seizures Sister   . Diabetes Sister   . Anesthesia problems Neg Hx   . Hypotension Neg Hx   . Malignant hyperthermia Neg Hx   . Pseudochol deficiency Neg Hx     Social History   Socioeconomic History  . Marital status: Widowed    Spouse name: Not on file  . Number of children: Not on file  . Years of education: Not on file  . Highest education level: Not on file  Occupational History  . Not on file  Tobacco Use  . Smoking status: Former Smoker    Types: Cigars    Quit date: 09/01/2006    Years since quitting: 13.2  . Smokeless tobacco: Never Used  Substance and Sexual  Activity  . 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 Drug use: No  . Sexual activity: Never  Other Topics Concern  . Not on file  Social History Narrative  . Not on file   Social Determinants of Health   Financial Resource Strain:   . Difficulty of Paying Living Expenses:   Food Insecurity:   . Worried About Charity fundraiser in the Last Year:   . Arboriculturist in the Last Year:   Transportation Needs:   . Film/video editor (Medical):   Marland Kitchen Lack of Transportation (Non-Medical):   Physical Activity:   . Days of Exercise per Week:   . Minutes of Exercise per Session:   Stress:   . Feeling of Stress :  Social Connections:   . Frequency of Communication with Friends and Family:   . Frequency of Social Gatherings with Friends and Family:   . Attends Religious Services:   . Active Member of Clubs or Organizations:   . Attends Archivist Meetings:   Marland Kitchen Marital Status:   Intimate Partner Violence:   . Fear of Current or Ex-Partner:   . Emotionally Abused:   Marland Kitchen Physically Abused:   . Sexually Abused:     Outpatient Medications Prior to Visit  Medication Sig Dispense Refill  . albuterol (PROVENTIL) (2.5 MG/3ML) 0.083% nebulizer solution Take 3 mLs (2.5 mg total) by nebulization 2 (two) times daily. And as needed 75 mL 5  . atorvastatin (LIPITOR) 40 MG tablet Take 40 mg by mouth daily.    . Azelastine HCl 0.15 % SOLN Place 2 sprays into both nostrils 2 (two) times daily.    . BD PEN NEEDLE NANO U/F 32G X 4 MM MISC     . clopidogrel (PLAVIX) 75 MG tablet TAKE 1 TABLET BY MOUTH ONCE DAILY 30 tablet 0  . docusate sodium (COLACE) 100 MG capsule Take 100 mg by mouth 2 (two) times daily.    . DULERA 200-5 MCG/ACT AERO     . famotidine (PEPCID) 20 MG tablet Take 20 mg by mouth 2 (two) times daily.     . ferrous sulfate 325 (65 FE) MG tablet Take 325 mg by mouth daily.    . Glucagon, rDNA, (GLUCAGON EMERGENCY) 1 MG KIT     .  hydrochlorothiazide (HYDRODIURIL) 25 MG tablet Take 25 mg by mouth daily.    Marland Kitchen ipratropium-albuterol (DUONEB) 0.5-2.5 (3) MG/3ML SOLN     . isosorbide mononitrate (IMDUR) 120 MG 24 hr tablet Take 120 mg by mouth daily.    Marland Kitchen latanoprost (XALATAN) 0.005 % ophthalmic solution Administer 1 drop to both eyes nightly.    Marland Kitchen LEVEMIR FLEXTOUCH 100 UNIT/ML Pen 43 units in the a.m. 23 units in the afternoon    . metolazone (ZAROXOLYN) 2.5 MG tablet Take 1 tablet (2.5 mg total) by mouth 2 (two) times a week. (Patient taking differently: Take 2.5 mg by mouth every Monday, Wednesday, and Friday. ) 30 tablet 3  . metoprolol succinate (TOPROL-XL) 50 MG 24 hr tablet Take 50 mg by mouth daily.    Marland Kitchen MOVANTIK 25 MG TABS tablet SMARTSIG:1 Tablet(s) By Mouth Daily    . nitroGLYCERIN (NITROSTAT) 0.4 MG SL tablet Place 0.4 mg under the tongue every 5 (five) minutes as needed for chest pain.     Marland Kitchen oxyCODONE (OXYCONTIN) 15 mg 12 hr tablet Take 15 mg by mouth every 12 (twelve) hours.    . OXYGEN Inhale into the lungs. 2 liters at bedtime    . pantoprazole (PROTONIX) 40 MG tablet Take 40 mg by mouth daily.     . Potassium Chloride ER 20 MEQ TBCR Take 20 mEq by mouth 3 (three) times daily.    . pregabalin (LYRICA) 75 MG capsule Take 1 capsule (75 mg total) by mouth 2 (two) times daily. 60 capsule 0  . RELISTOR 150 MG TABS Take 3 tablets by mouth every morning.    . sulfamethoxazole-trimethoprim (BACTRIM DS) 800-160 MG tablet Take 1 tablet by mouth 2 (two) times daily. 28 tablet 1  . torsemide (DEMADEX) 20 MG tablet     . Vitamin D, Ergocalciferol, (DRISDOL) 1.25 MG (50000 UNIT) CAPS capsule TAKE 1 CAPSULE BY MOUTH DAILY 90 capsule 1  . XARELTO 15 MG TABS  tablet TAKE 1 TABLET BY MOUTH ONCE DAILY WITH EVENING MEAL (Patient taking differently: Take 15 mg by mouth daily after supper. ) 90 tablet 2  . XTAMPZA ER 27 MG C12A Take 1 capsule by mouth 2 (two) times daily.     No facility-administered medications prior to visit.     Allergies  Allergen Reactions  . Hydrocodone Itching  . Morphine Itching  . Duloxetine Hcl Other (See Comments)  . Codeine Itching  . Penicillins Itching and Rash    DID THE REACTION INVOLVE: Swelling of the face/tongue/throat, SOB, or low BP? No Sudden or severe rash/hives, skin peeling, or the inside of the mouth or nose? No Did it require medical treatment? No When did it last happen?unknown If all above answers are "NO", may proceed with cephalosporin use.     ROS Review of Systems  Constitutional: Negative.   HENT: Negative.   Eyes: Negative.   Respiratory: Negative.   Cardiovascular: Negative.   Gastrointestinal: Negative.   Musculoskeletal: Negative.       Objective:    Physical Exam  Constitutional: He appears well-developed and well-nourished.  HENT:  Head: Normocephalic and atraumatic.  Cardiovascular: Normal rate, regular rhythm and normal heart sounds.  Pulmonary/Chest: Effort normal and breath sounds normal.  Musculoskeletal:        General: Normal range of motion.     Cervical back: Normal range of motion.  Skin: Skin is warm.     multiple stage 2 ulcers right lower leg, no purulence. Less pain and edema  Vitals reviewed.   BP (!) 180/80   Pulse 83   Temp 97.9 F (36.6 C)   Ht '5\' 7"'  (1.702 m)   Wt 264 lb 12.8 oz (120.1 kg)   SpO2 (!) 84%   BMI 41.47 kg/m  Wt Readings from Last 3 Encounters:  11/17/19 264 lb 12.8 oz (120.1 kg)  11/11/19 259 lb (117.5 kg)  10/11/19 263 lb 12.8 oz (119.7 kg)     Health Maintenance Due  Topic Date Due  . Hepatitis C Screening  Never done  . OPHTHALMOLOGY EXAM  Never done  . URINE MICROALBUMIN  Never done  . TETANUS/TDAP  Never done  . COLONOSCOPY  Never done  . HEMOGLOBIN A1C  08/02/2014  . PNA vac Low Risk Adult (2 of 2 - PCV13) 12/01/2014  . FOOT EXAM  02/28/2016  . INFLUENZA VACCINE  04/02/2019    There are no preventive care reminders to display for this patient.  Lab Results   Component Value Date   TSH 0.736 02/01/2014   Lab Results  Component Value Date   WBC 10.1 09/10/2018   HGB 11.6 (L) 10/11/2018   HCT 34.0 (L) 10/11/2018   MCV 89.6 09/10/2018   PLT 284 09/10/2018   Lab Results  Component Value Date   NA 141 10/11/2018   K 3.8 10/11/2018   CO2 23 09/11/2018   GLUCOSE 197 (H) 10/11/2018   BUN 16 09/11/2018   CREATININE 1.60 (H) 10/11/2018   BILITOT 1.5 (H) 09/01/2018   ALKPHOS 137 (H) 09/01/2018   AST 58 (H) 09/01/2018   ALT 32 09/01/2018   PROT 6.6 09/01/2018   ALBUMIN 2.5 (L) 09/01/2018   CALCIUM 8.4 (L) 09/11/2018   ANIONGAP 6 09/11/2018   Lab Results  Component Value Date   CHOL 91 01/31/2014   Lab Results  Component Value Date   HDL 31 (L) 01/31/2014   Lab Results  Component Value Date   LDLCALC 46 01/31/2014  Lab Results  Component Value Date   TRIG 69 01/31/2014   Lab Results  Component Value Date   CHOLHDL 2.9 01/31/2014   Lab Results  Component Value Date   HGBA1C 8.1 (H) 01/31/2014      Assessment & Plan:   Problem List Items Addressed This Visit      Other   Cellulitis of right leg    The leg looks better, rewrapped with unna boot.  Follow up 3 to 4 days since difficulty finding nurse agency.  Continue antibiotics.      Relevant Orders   Unna boot   Apply unna boot      No orders of the defined types were placed in this encounter.   Follow-up: No follow-ups on file.    Reinaldo Meeker, MD

## 2019-11-17 NOTE — Assessment & Plan Note (Signed)
The leg looks better, rewrapped with unna boot.  Follow up 3 to 4 days since difficulty finding nurse agency.  Continue antibiotics.

## 2019-11-20 DIAGNOSIS — I824Z2 Acute embolism and thrombosis of unspecified deep veins of left distal lower extremity: Secondary | ICD-10-CM | POA: Diagnosis not present

## 2019-11-20 DIAGNOSIS — I872 Venous insufficiency (chronic) (peripheral): Secondary | ICD-10-CM | POA: Diagnosis not present

## 2019-11-20 DIAGNOSIS — Z8673 Personal history of transient ischemic attack (TIA), and cerebral infarction without residual deficits: Secondary | ICD-10-CM | POA: Diagnosis not present

## 2019-11-20 DIAGNOSIS — M797 Fibromyalgia: Secondary | ICD-10-CM | POA: Diagnosis not present

## 2019-11-20 DIAGNOSIS — I482 Chronic atrial fibrillation, unspecified: Secondary | ICD-10-CM | POA: Diagnosis not present

## 2019-11-20 DIAGNOSIS — I5032 Chronic diastolic (congestive) heart failure: Secondary | ICD-10-CM | POA: Diagnosis not present

## 2019-11-20 DIAGNOSIS — L02415 Cutaneous abscess of right lower limb: Secondary | ICD-10-CM | POA: Diagnosis not present

## 2019-11-20 DIAGNOSIS — I110 Hypertensive heart disease with heart failure: Secondary | ICD-10-CM | POA: Diagnosis not present

## 2019-11-20 DIAGNOSIS — J449 Chronic obstructive pulmonary disease, unspecified: Secondary | ICD-10-CM | POA: Diagnosis not present

## 2019-11-20 DIAGNOSIS — I252 Old myocardial infarction: Secondary | ICD-10-CM | POA: Diagnosis not present

## 2019-11-20 DIAGNOSIS — L03115 Cellulitis of right lower limb: Secondary | ICD-10-CM | POA: Diagnosis not present

## 2019-11-20 DIAGNOSIS — J9620 Acute and chronic respiratory failure, unspecified whether with hypoxia or hypercapnia: Secondary | ICD-10-CM | POA: Diagnosis not present

## 2019-11-20 DIAGNOSIS — E785 Hyperlipidemia, unspecified: Secondary | ICD-10-CM | POA: Diagnosis not present

## 2019-11-20 DIAGNOSIS — G2581 Restless legs syndrome: Secondary | ICD-10-CM | POA: Diagnosis not present

## 2019-11-20 DIAGNOSIS — K219 Gastro-esophageal reflux disease without esophagitis: Secondary | ICD-10-CM | POA: Diagnosis not present

## 2019-11-20 DIAGNOSIS — M170 Bilateral primary osteoarthritis of knee: Secondary | ICD-10-CM | POA: Diagnosis not present

## 2019-11-20 DIAGNOSIS — E1151 Type 2 diabetes mellitus with diabetic peripheral angiopathy without gangrene: Secondary | ICD-10-CM | POA: Diagnosis not present

## 2019-11-20 DIAGNOSIS — E1142 Type 2 diabetes mellitus with diabetic polyneuropathy: Secondary | ICD-10-CM | POA: Diagnosis not present

## 2019-11-20 DIAGNOSIS — G4733 Obstructive sleep apnea (adult) (pediatric): Secondary | ICD-10-CM | POA: Diagnosis not present

## 2019-11-20 DIAGNOSIS — E039 Hypothyroidism, unspecified: Secondary | ICD-10-CM | POA: Diagnosis not present

## 2019-11-20 DIAGNOSIS — I25119 Atherosclerotic heart disease of native coronary artery with unspecified angina pectoris: Secondary | ICD-10-CM | POA: Diagnosis not present

## 2019-11-20 DIAGNOSIS — Z9181 History of falling: Secondary | ICD-10-CM | POA: Diagnosis not present

## 2019-11-20 DIAGNOSIS — B9562 Methicillin resistant Staphylococcus aureus infection as the cause of diseases classified elsewhere: Secondary | ICD-10-CM | POA: Diagnosis not present

## 2019-11-20 DIAGNOSIS — L97919 Non-pressure chronic ulcer of unspecified part of right lower leg with unspecified severity: Secondary | ICD-10-CM | POA: Diagnosis not present

## 2019-11-21 ENCOUNTER — Ambulatory Visit (INDEPENDENT_AMBULATORY_CARE_PROVIDER_SITE_OTHER): Payer: Medicare Other | Admitting: Legal Medicine

## 2019-11-21 ENCOUNTER — Encounter: Payer: Self-pay | Admitting: Legal Medicine

## 2019-11-21 ENCOUNTER — Other Ambulatory Visit: Payer: Self-pay

## 2019-11-21 VITALS — BP 130/84 | HR 75 | Temp 98.1°F | Wt 263.0 lb

## 2019-11-21 DIAGNOSIS — L03115 Cellulitis of right lower limb: Secondary | ICD-10-CM

## 2019-11-21 NOTE — Assessment & Plan Note (Signed)
The leg is healing well but needs unna boot.  Home health has been set up will continue wrapped.  The left foot has been dressed.

## 2019-11-21 NOTE — Progress Notes (Signed)
Established Patient Office Visit  Subjective:  Patient ID: Jon [name], male    DOB: 04/21/1951  Age: 69 y.o. MRN: [id number]  CC:  Chief Complaint  Patient presents with  . Cellulitis    HPI Jon [name] presents for follow up of right leg cellulitis.  The venous dopplers were negative.  The home health nurse has not yet come out since they are having problem with nursing service that takes his insurance.  The legs remain tender and some bleeding.  Jon [name] is on bactrim DS for MRSA infection.  Follow up on 11/21/2019: the right leg edema has improved and now only one ulcer is present.  Good healing.  There is a preulcerative callus left lateral foot. No redness.  Past Medical History:  Diagnosis Date  . Abnormal EKG 11/29/2014  . Acute renal insufficiency 11/29/2014  . Angina   . Arthritis    "knees" (01/18/2014)  . Asthma   . Atrial fibrillation ()   . Back pain 01/25/2015  . 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 BOOP (bronchiolitis obliterans with organizing pneumonia) ([place]) 01/20/2014   OLBx 01/20/14 BOOP Started steroids 02/28/14   . Chronic anticoagulation-Xarelto 02/09/2014  . Chronic atrial fibrillation ([place]) 01/03/2014  . Chronic bronchitis ([place]) 12/21/2013  . Chronic diastolic heart failure ([place]) 02/22/2015  . Chronic pain of both knees 03/18/2017   Overview:  Added automatically from request for surgery [id number]  . Chronic respiratory failure ([place]) 04/10/2015  . COPD (chronic obstructive pulmonary disease) ([place]) 01/25/2015  . Cough    coughing up blood- started last nite, seems to be worse now  . CVA (cerebral vascular accident) ([place]) 02/09/2014  . Dysrhythmia    atrial fib takes cardizem,   . Fibromyalgia   . GERD (gastroesophageal reflux disease)   . H/O hiatal hernia   . Hemoptysis 08/01/2015  . Herpes zoster 06/26/2015  . History of stroke   . Hyperkalemia-repeat pending 11/29/2014  . Hyperlipemia   . Hyperlipidemia   . Hypertension   . Hypothyroidism   . IDDM  (insulin dependent diabetes mellitus)    insulin pump   . Long term (current) use of anticoagulants 03/21/2014  . Mild CAD 02/20/2015   Overview:  On recent cardiac cath  Overview:  On recent cardiac cath  . Myocardial infarction [facility])    "one dr says yes; another says no"  . Neuromuscular disorder (HCC)    rls, neuropathy  . Neuropathy    rls, neuropathy   . Normal coronary arteries 2011 11/29/2014  . Obesity (BMI 30-39.9)   . On home oxygen therapy    "2L w/CPAP at bedtime" (09/01/2018)  . OSA on CPAP    cpap & oxygen  . Pericardial effusion   . Peritoneal effusion, chronic 02/22/2015   Overview:  With surgical window  . PNA (pneumonia) 04/10/2015  . Pneumonia 5/15   "several times" (01/18/2014)  . Precordial pain 01/26/2015  . Preoperative cardiovascular examination 02/24/2016  . Primary osteoarthritis of both knees 03/18/2017   Overview:  Added automatically from request for surgery [id number]  . Restless leg syndrome   . Sleep apnea    cpap & oxygen   . Stroke [facility]) 2012   denies residual on 01/18/2014  . Troponin level elevated 11/29/2014  . Uncontrolled type 2 diabetes mellitus with diabetic polyneuropathy, with long-term current use of insulin ([place]) 12/21/2013    Past Surgical History:  Procedure Laterality Date  . ABDOMINAL AORTOGRAM W/LOWER EXTREMITY N/A 09/08/2018   Procedure: ABDOMINAL  AORTOGRAM W/LOWER EXTREMITY;  Surgeon: Marty Heck, MD;  Location: [place] [facility];  Service: Cardiovascular;  Laterality: N/A;  . AMPUTATION Right 09/09/2018   Procedure: AMPUTATION RIGHT GREAT TOE;  Surgeon: Waynetta Sandy, MD;  Location: [place];  Service: Vascular;  Laterality: Right;  . APPENDECTOMY    . CARDIAC CATHETERIZATION  X 2 then 03/03/2012    NL LVF, normal coronaries, vessels are small ([facility]: Dr. Beatrix Fetters)  . CATARACT EXTRACTION W/ INTRAOCULAR LENS  IMPLANT, BILATERAL Bilateral   . CHOLECYSTECTOMY    . HERNIA REPAIR     UHR  . LAPAROSCOPIC GASTRIC BANDING  2010  .  LOWER EXTREMITY ANGIOGRAPHY Left 10/11/2018   Procedure: LOWER EXTREMITY ANGIOGRAPHY;  Surgeon: Waynetta Sandy, MD;  Location: [place] [facility];  Service: Cardiovascular;  Laterality: Left;  . PERICARDIAL WINDOW Left 01/24/2014   Procedure: PERICARDIAL WINDOW;  Surgeon: Gaye Pollack, MD;  Location: [place];  Service: Thoracic;  Laterality: Left;  . PERIPHERAL VASCULAR INTERVENTION Right 09/08/2018   Procedure: PERIPHERAL VASCULAR INTERVENTION;  Surgeon: Marty Heck, MD;  Location: [place] [facility];  Service: Cardiovascular;  Laterality: Right;  . SINUS EXPLORATION  X 2  . TEE WITHOUT CARDIOVERSION N/A 09/07/2018   Procedure: TRANSESOPHAGEAL ECHOCARDIOGRAM (TEE);  Surgeon: Acie Fredrickson Wonda Cheng, MD;  Location: [place];  Service: Cardiovascular;  Laterality: N/A;  . UMBILICAL HERNIA REPAIR    . VIDEO ASSISTED THORACOSCOPY Left 01/24/2014   Procedure: VIDEO ASSISTED THORACOSCOPY;  Surgeon: Gaye Pollack, MD;  Location[facility];  Service: Thoracic;  Laterality: Left;  VATS/open lung biopsy  . VIDEO BRONCHOSCOPY Bilateral 12/29/2013   Procedure: VIDEO BRONCHOSCOPY WITHOUT FLUORO;  Surgeon: Elsie Stain, MD;  Location: [facility];  Service: Endoscopy;  Laterality: Bilateral;  . VIDEO BRONCHOSCOPY N/A 01/24/2014   Procedure: VIDEO BRONCHOSCOPY;  Surgeon: Gaye Pollack, MD;  Location: [facility] OR;  Service: Thoracic;  Laterality: N/A;    Family History  Problem Relation Age of Onset  . Cancer Mother   . Stroke Father   . Heart disease Father   . Seizures Sister   . Diabetes Sister   . Anesthesia problems Neg Hx   . Hypotension Neg Hx   . Malignant hyperthermia Neg Hx   . Pseudochol deficiency Neg Hx     Social History   Socioeconomic History  . Marital status: Widowed    Spouse name: Not on file  . Number of children: Not on file  . Years of education: Not on file  . Highest education level: Not on file  Occupational History  . Not on file  Tobacco Use  . Smoking  status: Former Smoker    Types: Cigars    Quit date: 09/01/2006    Years since quitting: 13.2  . Smokeless tobacco: Never Used  Substance and Sexual Activity  . 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 Drug use: No  . Sexual activity: Never  Other Topics Concern  . Not on file  Social History Narrative  . Not on file   Social Determinants of Health   Financial Resource Strain:   . Difficulty of Paying Living Expenses:   Food Insecurity:   . Worried About Charity fundraiser in the Last Year:   . Arboriculturist in the Last Year:   Transportation Needs:   . Film/video editor (Medical):   Marland Kitchen Lack of Transportation (Non-Medical):  Physical Activity:   . Days of Exercise per Week:   . Minutes of Exercise per Session:   Stress:   . Feeling of Stress :   Social Connections:   . Frequency of Communication with Friends and Family:   . Frequency of Social Gatherings with Friends and Family:   . Attends Religious Services:   . Active Member of Clubs or Organizations:   . Attends Archivist Meetings:   Marland Kitchen Marital Status:   Intimate Partner Violence:   . Fear of Current or Ex-Partner:   . Emotionally Abused:   Marland Kitchen Physically Abused:   . Sexually Abused:     Outpatient Medications Prior to Visit  Medication Sig Dispense Refill  . albuterol (PROVENTIL) (2.5 MG/3ML) 0.083% nebulizer solution Take 3 mLs (2.5 mg total) by nebulization 2 (two) times daily. And as needed 75 mL 5  . atorvastatin (LIPITOR) 40 MG tablet Take 40 mg by mouth daily.    . Azelastine HCl 0.15 % SOLN Place 2 sprays into both nostrils 2 (two) times daily.    . BD PEN NEEDLE NANO U/F 32G X 4 MM MISC     . clopidogrel (PLAVIX) 75 MG tablet TAKE 1 TABLET BY MOUTH ONCE DAILY 30 tablet 0  . docusate sodium (COLACE) 100 MG capsule Take 100 mg by mouth 2 (two) times daily.    . DULERA 200-5 MCG/ACT AERO     . famotidine (PEPCID) 20 MG tablet Take 20 mg by mouth 2 (two)  times daily.     . ferrous sulfate 325 (65 FE) MG tablet Take 325 mg by mouth daily.    . Glucagon, rDNA, (GLUCAGON EMERGENCY) 1 MG KIT     . hydrochlorothiazide (HYDRODIURIL) 25 MG tablet Take 25 mg by mouth daily.    Marland Kitchen ipratropium-albuterol (DUONEB) 0.5-2.5 (3) MG/3ML SOLN     . isosorbide mononitrate (IMDUR) 120 MG 24 hr tablet Take 120 mg by mouth daily.    Marland Kitchen latanoprost (XALATAN) 0.005 % ophthalmic solution Administer 1 drop to both eyes nightly.    Marland Kitchen LEVEMIR FLEXTOUCH 100 UNIT/ML Pen 43 units in the a.m. 23 units in the afternoon    . metolazone (ZAROXOLYN) 2.5 MG tablet Take 1 tablet (2.5 mg total) by mouth 2 (two) times a week. (Patient taking differently: Take 2.5 mg by mouth every Monday, Wednesday, and Friday. ) 30 tablet 3  . metoprolol succinate (TOPROL-XL) 50 MG 24 hr tablet Take 50 mg by mouth daily.    Marland Kitchen MOVANTIK 25 MG TABS tablet SMARTSIG:1 Tablet(s) By Mouth Daily    . nitroGLYCERIN (NITROSTAT) 0.4 MG SL tablet Place 0.4 mg under the tongue every 5 (five) minutes as needed for chest pain.     Marland Kitchen oxyCODONE (OXYCONTIN) 15 mg 12 hr tablet Take 15 mg by mouth every 12 (twelve) hours.    . OXYGEN Inhale into the lungs. 2 liters at bedtime    . pantoprazole (PROTONIX) 40 MG tablet Take 40 mg by mouth daily.     . Potassium Chloride ER 20 MEQ TBCR Take 20 mEq by mouth 3 (three) times daily.    . pregabalin (LYRICA) 75 MG capsule Take 1 capsule (75 mg total) by mouth 2 (two) times daily. 60 capsule 0  . RELISTOR 150 MG TABS Take 3 tablets by mouth every morning.    . sulfamethoxazole-trimethoprim (BACTRIM DS) 800-160 MG tablet Take 1 tablet by mouth 2 (two) times daily. 28 tablet 1  . torsemide (DEMADEX) 20 MG tablet     .  Vitamin D, Ergocalciferol, (DRISDOL) 1.25 MG (50000 UNIT) CAPS capsule TAKE 1 CAPSULE BY MOUTH DAILY 90 capsule 1  . XARELTO 15 MG TABS tablet TAKE 1 TABLET BY MOUTH ONCE DAILY WITH EVENING MEAL (Patient taking differently: Take 15 mg by mouth daily after supper. ) 90  tablet 2  . XTAMPZA ER 27 MG C12A Take 1 capsule by mouth 2 (two) times daily.     No facility-administered medications prior to visit.    Allergies  Allergen Reactions  . Hydrocodone Itching  . Morphine Itching  . Duloxetine Hcl Other (See Comments)  . Codeine Itching  . Penicillins Itching and Rash    DID THE REACTION INVOLVE: Swelling of the face/tongue/throat, SOB, or low BP? No Sudden or severe rash/hives, skin peeling, or the inside of the mouth or nose? No Did it require medical treatment? No When did it last happen?unknown If all above answers are "NO", may proceed with cephalosporin use.     ROS Review of Systems  Constitutional: Negative.   HENT: Negative.   Eyes: Negative.   Respiratory: Negative.   Cardiovascular: Negative.   Gastrointestinal: Negative.   Musculoskeletal: Negative.   Skin: Positive for wound.      Objective:    Physical Exam  Constitutional: Jon [name] appears well-developed and well-nourished.  HENT:  Head: Normocephalic and atraumatic.  Cardiovascular: Normal rate, regular rhythm and normal heart sounds.  Pulmonary/Chest: Effort normal and breath sounds normal.  Musculoskeletal:        General: Normal range of motion.     Cervical back: Normal range of motion.  Skin: Skin is warm.     multiple stage 1 ulcers right lower leg, The other ulcers on right leg have healed, no purulence. Less pain and edema.  Right leg much improved.  There is a preulcerative callus on left lateral foot that was dressed.  Vitals reviewed.   BP 130/84   Pulse 75   Temp 98.1 F (36.7 C)   Wt 263 lb (119.3 kg)   SpO2 95%   BMI 41.19 kg/m  Wt Readings from Last 3 Encounters:  11/21/19 263 lb (119.3 kg)  11/17/19 264 lb 12.8 oz (120.1 kg)  11/11/19 259 lb (117.5 kg)     Health Maintenance Due  Topic Date Due  . Hepatitis C Screening  Never done  . OPHTHALMOLOGY EXAM  Never done  . URINE MICROALBUMIN  Never done  . TETANUS/TDAP  Never done  .  COLONOSCOPY  Never done  . HEMOGLOBIN A1C  08/02/2014  . PNA vac Low Risk Adult (2 of 2 - PCV13) 12/01/2014  . FOOT EXAM  02/28/2016  . INFLUENZA VACCINE  04/02/2019    There are no preventive care reminders to display for this patient.  Lab Results  Component Value Date   TSH 0.736 02/01/2014   Lab Results  Component Value Date   WBC 10.1 09/10/2018   HGB 11.6 (L) 10/11/2018   HCT 34.0 (L) 10/11/2018   MCV 89.6 09/10/2018   PLT 284 09/10/2018   Lab Results  Component Value Date   NA 141 10/11/2018   K 3.8 10/11/2018   CO2 23 09/11/2018   GLUCOSE 197 (H) 10/11/2018   BUN 16 09/11/2018   CREATININE 1.60 (H) 10/11/2018   BILITOT 1.5 (H) 09/01/2018   ALKPHOS 137 (H) 09/01/2018   AST 58 (H) 09/01/2018   ALT 32 09/01/2018   PROT 6.6 09/01/2018   ALBUMIN 2.5 (L) 09/01/2018   CALCIUM 8.4 (L) 09/11/2018  ANIONGAP 6 09/11/2018   Lab Results  Component Value Date   CHOL 91 01/31/2014   Lab Results  Component Value Date   HDL 31 (L) 01/31/2014   Lab Results  Component Value Date   LDLCALC 46 01/31/2014   Lab Results  Component Value Date   TRIG 69 01/31/2014   Lab Results  Component Value Date   CHOLHDL 2.9 01/31/2014   Lab Results  Component Value Date   HGBA1C 8.1 (H) 01/31/2014      Assessment & Plan:   Problem List Items Addressed This Visit      Other   Cellulitis of right leg - Primary    The leg is healing well but needs unna boot.  Home health has been set up will continue wrapped.  The left foot has been dressed.      Relevant Orders   Unna boot   Apply unna boot      No orders of the defined types were placed in this encounter.   Follow-up: Return Jon [name] is scdualed for april already, see sooner if worsening.    Reinaldo Meeker, MD

## 2019-11-22 DIAGNOSIS — I824Z2 Acute embolism and thrombosis of unspecified deep veins of left distal lower extremity: Secondary | ICD-10-CM | POA: Diagnosis not present

## 2019-11-22 DIAGNOSIS — L02415 Cutaneous abscess of right lower limb: Secondary | ICD-10-CM | POA: Diagnosis not present

## 2019-11-22 DIAGNOSIS — K219 Gastro-esophageal reflux disease without esophagitis: Secondary | ICD-10-CM | POA: Diagnosis not present

## 2019-11-22 DIAGNOSIS — E1142 Type 2 diabetes mellitus with diabetic polyneuropathy: Secondary | ICD-10-CM | POA: Diagnosis not present

## 2019-11-22 DIAGNOSIS — Z9181 History of falling: Secondary | ICD-10-CM | POA: Diagnosis not present

## 2019-11-22 DIAGNOSIS — L03115 Cellulitis of right lower limb: Secondary | ICD-10-CM | POA: Diagnosis not present

## 2019-11-22 DIAGNOSIS — I252 Old myocardial infarction: Secondary | ICD-10-CM | POA: Diagnosis not present

## 2019-11-22 DIAGNOSIS — E1151 Type 2 diabetes mellitus with diabetic peripheral angiopathy without gangrene: Secondary | ICD-10-CM | POA: Diagnosis not present

## 2019-11-22 DIAGNOSIS — J9620 Acute and chronic respiratory failure, unspecified whether with hypoxia or hypercapnia: Secondary | ICD-10-CM | POA: Diagnosis not present

## 2019-11-22 DIAGNOSIS — M170 Bilateral primary osteoarthritis of knee: Secondary | ICD-10-CM | POA: Diagnosis not present

## 2019-11-22 DIAGNOSIS — I872 Venous insufficiency (chronic) (peripheral): Secondary | ICD-10-CM | POA: Diagnosis not present

## 2019-11-22 DIAGNOSIS — B9562 Methicillin resistant Staphylococcus aureus infection as the cause of diseases classified elsewhere: Secondary | ICD-10-CM | POA: Diagnosis not present

## 2019-11-22 DIAGNOSIS — I110 Hypertensive heart disease with heart failure: Secondary | ICD-10-CM | POA: Diagnosis not present

## 2019-11-22 DIAGNOSIS — E785 Hyperlipidemia, unspecified: Secondary | ICD-10-CM | POA: Diagnosis not present

## 2019-11-22 DIAGNOSIS — J449 Chronic obstructive pulmonary disease, unspecified: Secondary | ICD-10-CM | POA: Diagnosis not present

## 2019-11-22 DIAGNOSIS — G2581 Restless legs syndrome: Secondary | ICD-10-CM | POA: Diagnosis not present

## 2019-11-22 DIAGNOSIS — I482 Chronic atrial fibrillation, unspecified: Secondary | ICD-10-CM | POA: Diagnosis not present

## 2019-11-22 DIAGNOSIS — E039 Hypothyroidism, unspecified: Secondary | ICD-10-CM | POA: Diagnosis not present

## 2019-11-22 DIAGNOSIS — I25119 Atherosclerotic heart disease of native coronary artery with unspecified angina pectoris: Secondary | ICD-10-CM | POA: Diagnosis not present

## 2019-11-22 DIAGNOSIS — L97919 Non-pressure chronic ulcer of unspecified part of right lower leg with unspecified severity: Secondary | ICD-10-CM | POA: Diagnosis not present

## 2019-11-22 DIAGNOSIS — G4733 Obstructive sleep apnea (adult) (pediatric): Secondary | ICD-10-CM | POA: Diagnosis not present

## 2019-11-22 DIAGNOSIS — I5032 Chronic diastolic (congestive) heart failure: Secondary | ICD-10-CM | POA: Diagnosis not present

## 2019-11-22 DIAGNOSIS — M797 Fibromyalgia: Secondary | ICD-10-CM | POA: Diagnosis not present

## 2019-11-22 DIAGNOSIS — Z8673 Personal history of transient ischemic attack (TIA), and cerebral infarction without residual deficits: Secondary | ICD-10-CM | POA: Diagnosis not present

## 2019-11-22 NOTE — Addendum Note (Signed)
Addended by: Reinaldo Meeker on: 11/22/2019 08:06 AM   Modules accepted: Orders

## 2019-11-23 DIAGNOSIS — L02415 Cutaneous abscess of right lower limb: Secondary | ICD-10-CM | POA: Diagnosis not present

## 2019-11-23 DIAGNOSIS — L03115 Cellulitis of right lower limb: Secondary | ICD-10-CM | POA: Diagnosis not present

## 2019-11-23 DIAGNOSIS — B9562 Methicillin resistant Staphylococcus aureus infection as the cause of diseases classified elsewhere: Secondary | ICD-10-CM | POA: Diagnosis not present

## 2019-11-23 DIAGNOSIS — J9610 Chronic respiratory failure, unspecified whether with hypoxia or hypercapnia: Secondary | ICD-10-CM | POA: Diagnosis not present

## 2019-11-24 DIAGNOSIS — I482 Chronic atrial fibrillation, unspecified: Secondary | ICD-10-CM | POA: Diagnosis not present

## 2019-11-24 DIAGNOSIS — L02415 Cutaneous abscess of right lower limb: Secondary | ICD-10-CM | POA: Diagnosis not present

## 2019-11-24 DIAGNOSIS — L97919 Non-pressure chronic ulcer of unspecified part of right lower leg with unspecified severity: Secondary | ICD-10-CM | POA: Diagnosis not present

## 2019-11-24 DIAGNOSIS — E1151 Type 2 diabetes mellitus with diabetic peripheral angiopathy without gangrene: Secondary | ICD-10-CM | POA: Diagnosis not present

## 2019-11-24 DIAGNOSIS — I110 Hypertensive heart disease with heart failure: Secondary | ICD-10-CM | POA: Diagnosis not present

## 2019-11-24 DIAGNOSIS — B9562 Methicillin resistant Staphylococcus aureus infection as the cause of diseases classified elsewhere: Secondary | ICD-10-CM | POA: Diagnosis not present

## 2019-11-24 DIAGNOSIS — J9620 Acute and chronic respiratory failure, unspecified whether with hypoxia or hypercapnia: Secondary | ICD-10-CM | POA: Diagnosis not present

## 2019-11-24 DIAGNOSIS — Z8673 Personal history of transient ischemic attack (TIA), and cerebral infarction without residual deficits: Secondary | ICD-10-CM | POA: Diagnosis not present

## 2019-11-24 DIAGNOSIS — E785 Hyperlipidemia, unspecified: Secondary | ICD-10-CM | POA: Diagnosis not present

## 2019-11-24 DIAGNOSIS — I5032 Chronic diastolic (congestive) heart failure: Secondary | ICD-10-CM | POA: Diagnosis not present

## 2019-11-24 DIAGNOSIS — L03115 Cellulitis of right lower limb: Secondary | ICD-10-CM | POA: Diagnosis not present

## 2019-11-24 DIAGNOSIS — G2581 Restless legs syndrome: Secondary | ICD-10-CM | POA: Diagnosis not present

## 2019-11-24 DIAGNOSIS — E1142 Type 2 diabetes mellitus with diabetic polyneuropathy: Secondary | ICD-10-CM | POA: Diagnosis not present

## 2019-11-24 DIAGNOSIS — I25119 Atherosclerotic heart disease of native coronary artery with unspecified angina pectoris: Secondary | ICD-10-CM | POA: Diagnosis not present

## 2019-11-24 DIAGNOSIS — G4733 Obstructive sleep apnea (adult) (pediatric): Secondary | ICD-10-CM | POA: Diagnosis not present

## 2019-11-24 DIAGNOSIS — I824Z2 Acute embolism and thrombosis of unspecified deep veins of left distal lower extremity: Secondary | ICD-10-CM | POA: Diagnosis not present

## 2019-11-24 DIAGNOSIS — E039 Hypothyroidism, unspecified: Secondary | ICD-10-CM | POA: Diagnosis not present

## 2019-11-24 DIAGNOSIS — M170 Bilateral primary osteoarthritis of knee: Secondary | ICD-10-CM | POA: Diagnosis not present

## 2019-11-24 DIAGNOSIS — Z9181 History of falling: Secondary | ICD-10-CM | POA: Diagnosis not present

## 2019-11-24 DIAGNOSIS — M797 Fibromyalgia: Secondary | ICD-10-CM | POA: Diagnosis not present

## 2019-11-24 DIAGNOSIS — J449 Chronic obstructive pulmonary disease, unspecified: Secondary | ICD-10-CM | POA: Diagnosis not present

## 2019-11-24 DIAGNOSIS — I872 Venous insufficiency (chronic) (peripheral): Secondary | ICD-10-CM | POA: Diagnosis not present

## 2019-11-24 DIAGNOSIS — K219 Gastro-esophageal reflux disease without esophagitis: Secondary | ICD-10-CM | POA: Diagnosis not present

## 2019-11-24 DIAGNOSIS — I252 Old myocardial infarction: Secondary | ICD-10-CM | POA: Diagnosis not present

## 2019-11-24 NOTE — Addendum Note (Signed)
Addended by: Reinaldo Meeker on: 11/24/2019 07:25 AM   Modules accepted: Orders

## 2019-11-24 NOTE — Addendum Note (Signed)
Addended by: Reinaldo Meeker on: 11/24/2019 07:36 AM   Modules accepted: Orders

## 2019-11-25 DIAGNOSIS — J9610 Chronic respiratory failure, unspecified whether with hypoxia or hypercapnia: Secondary | ICD-10-CM | POA: Diagnosis not present

## 2019-11-29 ENCOUNTER — Other Ambulatory Visit: Payer: Self-pay | Admitting: Family Medicine

## 2019-11-29 DIAGNOSIS — K219 Gastro-esophageal reflux disease without esophagitis: Secondary | ICD-10-CM | POA: Diagnosis not present

## 2019-11-29 DIAGNOSIS — J9620 Acute and chronic respiratory failure, unspecified whether with hypoxia or hypercapnia: Secondary | ICD-10-CM | POA: Diagnosis not present

## 2019-11-29 DIAGNOSIS — I25119 Atherosclerotic heart disease of native coronary artery with unspecified angina pectoris: Secondary | ICD-10-CM | POA: Diagnosis not present

## 2019-11-29 DIAGNOSIS — E039 Hypothyroidism, unspecified: Secondary | ICD-10-CM | POA: Diagnosis not present

## 2019-11-29 DIAGNOSIS — G4733 Obstructive sleep apnea (adult) (pediatric): Secondary | ICD-10-CM | POA: Diagnosis not present

## 2019-11-29 DIAGNOSIS — I824Z2 Acute embolism and thrombosis of unspecified deep veins of left distal lower extremity: Secondary | ICD-10-CM | POA: Diagnosis not present

## 2019-11-29 DIAGNOSIS — Z8673 Personal history of transient ischemic attack (TIA), and cerebral infarction without residual deficits: Secondary | ICD-10-CM | POA: Diagnosis not present

## 2019-11-29 DIAGNOSIS — I5032 Chronic diastolic (congestive) heart failure: Secondary | ICD-10-CM | POA: Diagnosis not present

## 2019-11-29 DIAGNOSIS — L02415 Cutaneous abscess of right lower limb: Secondary | ICD-10-CM | POA: Diagnosis not present

## 2019-11-29 DIAGNOSIS — E1151 Type 2 diabetes mellitus with diabetic peripheral angiopathy without gangrene: Secondary | ICD-10-CM | POA: Diagnosis not present

## 2019-11-29 DIAGNOSIS — G2581 Restless legs syndrome: Secondary | ICD-10-CM | POA: Diagnosis not present

## 2019-11-29 DIAGNOSIS — B9562 Methicillin resistant Staphylococcus aureus infection as the cause of diseases classified elsewhere: Secondary | ICD-10-CM | POA: Diagnosis not present

## 2019-11-29 DIAGNOSIS — E785 Hyperlipidemia, unspecified: Secondary | ICD-10-CM | POA: Diagnosis not present

## 2019-11-29 DIAGNOSIS — L03115 Cellulitis of right lower limb: Secondary | ICD-10-CM | POA: Diagnosis not present

## 2019-11-29 DIAGNOSIS — Z9181 History of falling: Secondary | ICD-10-CM | POA: Diagnosis not present

## 2019-11-29 DIAGNOSIS — J449 Chronic obstructive pulmonary disease, unspecified: Secondary | ICD-10-CM | POA: Diagnosis not present

## 2019-11-29 DIAGNOSIS — I110 Hypertensive heart disease with heart failure: Secondary | ICD-10-CM | POA: Diagnosis not present

## 2019-11-29 DIAGNOSIS — I872 Venous insufficiency (chronic) (peripheral): Secondary | ICD-10-CM | POA: Diagnosis not present

## 2019-11-29 DIAGNOSIS — M170 Bilateral primary osteoarthritis of knee: Secondary | ICD-10-CM | POA: Diagnosis not present

## 2019-11-29 DIAGNOSIS — I252 Old myocardial infarction: Secondary | ICD-10-CM | POA: Diagnosis not present

## 2019-11-29 DIAGNOSIS — L97919 Non-pressure chronic ulcer of unspecified part of right lower leg with unspecified severity: Secondary | ICD-10-CM | POA: Diagnosis not present

## 2019-11-29 DIAGNOSIS — I482 Chronic atrial fibrillation, unspecified: Secondary | ICD-10-CM | POA: Diagnosis not present

## 2019-11-29 DIAGNOSIS — M797 Fibromyalgia: Secondary | ICD-10-CM | POA: Diagnosis not present

## 2019-11-29 DIAGNOSIS — E1142 Type 2 diabetes mellitus with diabetic polyneuropathy: Secondary | ICD-10-CM | POA: Diagnosis not present

## 2019-12-02 DIAGNOSIS — I25119 Atherosclerotic heart disease of native coronary artery with unspecified angina pectoris: Secondary | ICD-10-CM | POA: Diagnosis not present

## 2019-12-02 DIAGNOSIS — I110 Hypertensive heart disease with heart failure: Secondary | ICD-10-CM | POA: Diagnosis not present

## 2019-12-02 DIAGNOSIS — E039 Hypothyroidism, unspecified: Secondary | ICD-10-CM | POA: Diagnosis not present

## 2019-12-02 DIAGNOSIS — I872 Venous insufficiency (chronic) (peripheral): Secondary | ICD-10-CM | POA: Diagnosis not present

## 2019-12-02 DIAGNOSIS — E1142 Type 2 diabetes mellitus with diabetic polyneuropathy: Secondary | ICD-10-CM | POA: Diagnosis not present

## 2019-12-02 DIAGNOSIS — I5032 Chronic diastolic (congestive) heart failure: Secondary | ICD-10-CM | POA: Diagnosis not present

## 2019-12-02 DIAGNOSIS — Z8673 Personal history of transient ischemic attack (TIA), and cerebral infarction without residual deficits: Secondary | ICD-10-CM | POA: Diagnosis not present

## 2019-12-02 DIAGNOSIS — G2581 Restless legs syndrome: Secondary | ICD-10-CM | POA: Diagnosis not present

## 2019-12-02 DIAGNOSIS — I824Z2 Acute embolism and thrombosis of unspecified deep veins of left distal lower extremity: Secondary | ICD-10-CM | POA: Diagnosis not present

## 2019-12-02 DIAGNOSIS — K219 Gastro-esophageal reflux disease without esophagitis: Secondary | ICD-10-CM | POA: Diagnosis not present

## 2019-12-02 DIAGNOSIS — E785 Hyperlipidemia, unspecified: Secondary | ICD-10-CM | POA: Diagnosis not present

## 2019-12-02 DIAGNOSIS — J9620 Acute and chronic respiratory failure, unspecified whether with hypoxia or hypercapnia: Secondary | ICD-10-CM | POA: Diagnosis not present

## 2019-12-02 DIAGNOSIS — G4733 Obstructive sleep apnea (adult) (pediatric): Secondary | ICD-10-CM | POA: Diagnosis not present

## 2019-12-02 DIAGNOSIS — J449 Chronic obstructive pulmonary disease, unspecified: Secondary | ICD-10-CM | POA: Diagnosis not present

## 2019-12-02 DIAGNOSIS — M797 Fibromyalgia: Secondary | ICD-10-CM | POA: Diagnosis not present

## 2019-12-02 DIAGNOSIS — I482 Chronic atrial fibrillation, unspecified: Secondary | ICD-10-CM | POA: Diagnosis not present

## 2019-12-02 DIAGNOSIS — L02415 Cutaneous abscess of right lower limb: Secondary | ICD-10-CM | POA: Diagnosis not present

## 2019-12-02 DIAGNOSIS — L97919 Non-pressure chronic ulcer of unspecified part of right lower leg with unspecified severity: Secondary | ICD-10-CM | POA: Diagnosis not present

## 2019-12-02 DIAGNOSIS — Z9181 History of falling: Secondary | ICD-10-CM | POA: Diagnosis not present

## 2019-12-02 DIAGNOSIS — E1151 Type 2 diabetes mellitus with diabetic peripheral angiopathy without gangrene: Secondary | ICD-10-CM | POA: Diagnosis not present

## 2019-12-02 DIAGNOSIS — M170 Bilateral primary osteoarthritis of knee: Secondary | ICD-10-CM | POA: Diagnosis not present

## 2019-12-02 DIAGNOSIS — B9562 Methicillin resistant Staphylococcus aureus infection as the cause of diseases classified elsewhere: Secondary | ICD-10-CM | POA: Diagnosis not present

## 2019-12-02 DIAGNOSIS — I252 Old myocardial infarction: Secondary | ICD-10-CM | POA: Diagnosis not present

## 2019-12-02 DIAGNOSIS — L03115 Cellulitis of right lower limb: Secondary | ICD-10-CM | POA: Diagnosis not present

## 2019-12-05 ENCOUNTER — Other Ambulatory Visit: Payer: Self-pay | Admitting: Legal Medicine

## 2019-12-06 DIAGNOSIS — L03115 Cellulitis of right lower limb: Secondary | ICD-10-CM | POA: Diagnosis not present

## 2019-12-06 DIAGNOSIS — J9620 Acute and chronic respiratory failure, unspecified whether with hypoxia or hypercapnia: Secondary | ICD-10-CM | POA: Diagnosis not present

## 2019-12-06 DIAGNOSIS — Z9181 History of falling: Secondary | ICD-10-CM | POA: Diagnosis not present

## 2019-12-06 DIAGNOSIS — I252 Old myocardial infarction: Secondary | ICD-10-CM | POA: Diagnosis not present

## 2019-12-06 DIAGNOSIS — L97919 Non-pressure chronic ulcer of unspecified part of right lower leg with unspecified severity: Secondary | ICD-10-CM | POA: Diagnosis not present

## 2019-12-06 DIAGNOSIS — I110 Hypertensive heart disease with heart failure: Secondary | ICD-10-CM | POA: Diagnosis not present

## 2019-12-06 DIAGNOSIS — M170 Bilateral primary osteoarthritis of knee: Secondary | ICD-10-CM | POA: Diagnosis not present

## 2019-12-06 DIAGNOSIS — M797 Fibromyalgia: Secondary | ICD-10-CM | POA: Diagnosis not present

## 2019-12-06 DIAGNOSIS — K219 Gastro-esophageal reflux disease without esophagitis: Secondary | ICD-10-CM | POA: Diagnosis not present

## 2019-12-06 DIAGNOSIS — G2581 Restless legs syndrome: Secondary | ICD-10-CM | POA: Diagnosis not present

## 2019-12-06 DIAGNOSIS — G4733 Obstructive sleep apnea (adult) (pediatric): Secondary | ICD-10-CM | POA: Diagnosis not present

## 2019-12-06 DIAGNOSIS — I25119 Atherosclerotic heart disease of native coronary artery with unspecified angina pectoris: Secondary | ICD-10-CM | POA: Diagnosis not present

## 2019-12-06 DIAGNOSIS — E039 Hypothyroidism, unspecified: Secondary | ICD-10-CM | POA: Diagnosis not present

## 2019-12-06 DIAGNOSIS — L02415 Cutaneous abscess of right lower limb: Secondary | ICD-10-CM | POA: Diagnosis not present

## 2019-12-06 DIAGNOSIS — Z8673 Personal history of transient ischemic attack (TIA), and cerebral infarction without residual deficits: Secondary | ICD-10-CM | POA: Diagnosis not present

## 2019-12-06 DIAGNOSIS — E785 Hyperlipidemia, unspecified: Secondary | ICD-10-CM | POA: Diagnosis not present

## 2019-12-06 DIAGNOSIS — I482 Chronic atrial fibrillation, unspecified: Secondary | ICD-10-CM | POA: Diagnosis not present

## 2019-12-06 DIAGNOSIS — J449 Chronic obstructive pulmonary disease, unspecified: Secondary | ICD-10-CM | POA: Diagnosis not present

## 2019-12-06 DIAGNOSIS — I872 Venous insufficiency (chronic) (peripheral): Secondary | ICD-10-CM | POA: Diagnosis not present

## 2019-12-06 DIAGNOSIS — B9562 Methicillin resistant Staphylococcus aureus infection as the cause of diseases classified elsewhere: Secondary | ICD-10-CM | POA: Diagnosis not present

## 2019-12-06 DIAGNOSIS — E1151 Type 2 diabetes mellitus with diabetic peripheral angiopathy without gangrene: Secondary | ICD-10-CM | POA: Diagnosis not present

## 2019-12-06 DIAGNOSIS — E1142 Type 2 diabetes mellitus with diabetic polyneuropathy: Secondary | ICD-10-CM | POA: Diagnosis not present

## 2019-12-06 DIAGNOSIS — I5032 Chronic diastolic (congestive) heart failure: Secondary | ICD-10-CM | POA: Diagnosis not present

## 2019-12-06 DIAGNOSIS — I824Z2 Acute embolism and thrombosis of unspecified deep veins of left distal lower extremity: Secondary | ICD-10-CM | POA: Diagnosis not present

## 2019-12-08 NOTE — Progress Notes (Signed)
Established Patient Office Visit  Subjective:  Patient ID: Jon [name], male    DOB: 1950-09-12  Age: 69 y.o. MRN: [id number]  CC:  Chief Complaint  Patient presents with  . Diabetes    Increased insulin last visit    HPI Jon [name] presents for follow-up of his diabetes.  At his last visit I increased his Levemir to 45 units in a.m. and 25 units in p.m.  He continues to have hypoglycemia down to the 60s in the morning.  It is often between 19 and 90.  Before lunch it is 170-323, before supper 107-302, and before bed 187-348.  Jon [name] is very unhappy with his living situation.  He is living with his stepdaughter since his wife passed.  He says they have numerous individuals staying at the house.  He generally stays in his room unless he has to eat.  He says he does not have very much to eat although he is getting Meals on Wheels.  I have discussed this with her daughter and it sounds as if he has food just chooses not to eat it.  For his neuropathy he is currently taking Lyrica 75 mg twice daily.  He did have some issues with tachycardia/palpitations when he initially started this so we lowered the dose.  He has tolerated this and it significantly improves his pain associated with his neuropathy.  Jon [name] was seen by Dr. Henrene Pastor over the last 3 weeks for lower extremity edema and acute ulcerations which occur due to the swelling.  He has had home health care coming out and doing Unna boots every 3 to 5 days.  They were supposed to come today however he had this appointment.  He does feel like it is helping and they have improved.  He was treated with a course of Bactrim which he has completed.  Patient has hyperlipidemia and is currently taking atorvastatin 40 mg once daily.  It is unclear whether or not he eats a healthy diet although unlikely.  No significant exercise due to arthritis in his knees.  Oddly enough he said to cardiac catheterizations which showed no significant coronary artery  disease. Patient has had a stroke in February 2012.  He suffers no sequela from that stroke.  He has chronic atrial fibrillation which is rate controlled.  He is currently taking Xarelto 15 mg once daily and Plavix 75 mg once daily.Marland Kitchen  He takes metoprolol 50 mg once daily. Patient has hypertension with chronic kidney disease as well as diastolic heart failure.  He is taking torsemide 20 mg 3 tablets twice a day which was increased by Dr. Henrene Pastor.  In addition he is taking potassium 20 mEq 2 tablets twice daily.  He is also on metolazone 2-1/2 mg 1 p.o. 3 hours prior to his Lasix daily.  He takes metoprolol 50 mg once daily and Imdur 20 mg once daily.  The patient has seen Dr. Moshe Cipro, nephrology, in the past.  Patient has a chronic pain syndrome related to significant osteoarthritis of his knees as well as torn ligaments in the right knee.  He has not been a candidate for surgery so he is medically managed.  He was seen at the pain clinic in [place] but I tried to do some local injections/blocks but this was unsuccessful.  I currently have him on Xtampza ER 27 mg 1 p.o. twice daily which I have continued from the pain clinic.  Patient was having significant problems with transportation to his various  specialists so I agreed to take over his care.  He currently takes Relistor 150 mg 3 tablets in the morning as well as Movantik 25 mg once daily before his first meal for his drug-induced constipation.   Patient has chronic anemia secondary to his chronic kidney disease.  He has seen a hematologist in the past.  He has been maintained on ferrous sulfate 325 mg once daily.  Jon [name] also has asthma.  He also likely has an element of COPD.  These are overlapping. For a period of time he was in chronic respiratory failure and had oxygen at home.  He does not currently use this.  He is currently on Dulera 2 puffs twice daily. Past Medical History:  Diagnosis Date  . Abnormal EKG 11/29/2014  . Acute renal  insufficiency 11/29/2014  . Angina   . Arthritis    "knees" (01/18/2014)  . Asthma   . Atrial fibrillation ([place])   . Back pain 01/25/2015  . 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 BOOP (bronchiolitis obliterans with organizing pneumonia) ([place]) 01/20/2014   OLBx 01/20/14 BOOP Started steroids 02/28/14   . Chronic anticoagulation-Xarelto 02/09/2014  . Chronic atrial fibrillation ([place]) 01/03/2014  . Chronic bronchitis ([place]) 12/21/2013  . Chronic diastolic heart failure ([place]) 02/22/2015  . Chronic pain of both knees 03/18/2017   Overview:  Added automatically from request for surgery [id number]  . Chronic respiratory failure ([place]) 04/10/2015  . COPD (chronic obstructive pulmonary disease) ([place]) 01/25/2015  . Cough    coughing up blood- started last nite, seems to be worse now  . CVA (cerebral vascular accident) ([place]) 02/09/2014  . Dysrhythmia    atrial fib takes cardizem,   . Fibromyalgia   . GERD (gastroesophageal reflux disease)   . H/O hiatal hernia   . Hemoptysis 08/01/2015  . Herpes zoster 06/26/2015  . History of stroke   . Hyperkalemia-repeat pending 11/29/2014  . Hyperlipemia   . Hyperlipidemia   . Hypertension   . Hypothyroidism   . IDDM (insulin dependent diabetes mellitus)    insulin pump   . Long term (current) use of anticoagulants 03/21/2014  . Mild CAD 02/20/2015   Overview:  On recent cardiac cath  Overview:  On recent cardiac cath  . Myocardial infarction [facility])    "one dr says yes; another says no"  . Neuromuscular disorder (HCC)    rls, neuropathy  . Neuropathy    rls, neuropathy   . Normal coronary arteries 2011 11/29/2014  . Obesity (BMI 30-39.9)   . On home oxygen therapy    "2L w/CPAP at bedtime" (09/01/2018)  . OSA on CPAP    cpap & oxygen  . Pericardial effusion   . Peritoneal effusion, chronic 02/22/2015   Overview:  With surgical window  . PNA (pneumonia) 04/10/2015  . Pneumonia 5/15   "several times" (01/18/2014)  . Precordial pain 01/26/2015  . Preoperative  cardiovascular examination 02/24/2016  . Primary osteoarthritis of both knees 03/18/2017   Overview:  Added automatically from request for surgery [id number]  . Restless leg syndrome   . Sleep apnea    cpap & oxygen   . Stroke [facility]) 2012   denies residual on 01/18/2014  . Troponin level elevated 11/29/2014  . Uncontrolled type 2 diabetes mellitus with diabetic polyneuropathy, with long-term current use of insulin ([place]) 12/21/2013    Past Surgical History:  Procedure Laterality Date  . ABDOMINAL AORTOGRAM W/LOWER EXTREMITY N/A 09/08/2018   Procedure: ABDOMINAL AORTOGRAM W/LOWER EXTREMITY;  Surgeon: Marty Heck,  MD;  Location: [place] [facility];  Service: Cardiovascular;  Laterality: N/A;  . AMPUTATION Right 09/09/2018   Procedure: AMPUTATION RIGHT GREAT TOE;  Surgeon: Waynetta Sandy, MD;  Location: [place];  Service: Vascular;  Laterality: Right;  . APPENDECTOMY    . CARDIAC CATHETERIZATION  X 2 then 03/03/2012    NL LVF, normal coronaries, vessels are small ([facility]: Dr. Beatrix Fetters)  . CATARACT EXTRACTION W/ INTRAOCULAR LENS  IMPLANT, BILATERAL Bilateral   . CHOLECYSTECTOMY    . HERNIA REPAIR     UHR  . LAPAROSCOPIC GASTRIC BANDING  2010  . LOWER EXTREMITY ANGIOGRAPHY Left 10/11/2018   Procedure: LOWER EXTREMITY ANGIOGRAPHY;  Surgeon: Waynetta Sandy, MD;  Location: [place] [facility];  Service: Cardiovascular;  Laterality: Left;  . PERICARDIAL WINDOW Left 01/24/2014   Procedure: PERICARDIAL WINDOW;  Surgeon: Gaye Pollack, MD;  Location: [place];  Service: Thoracic;  Laterality: Left;  . PERIPHERAL VASCULAR INTERVENTION Right 09/08/2018   Procedure: PERIPHERAL VASCULAR INTERVENTION;  Surgeon: Marty Heck, MD;  Location: [place] [facility];  Service: Cardiovascular;  Laterality: Right;  . SINUS EXPLORATION  X 2  . TEE WITHOUT CARDIOVERSION N/A 09/07/2018   Procedure: TRANSESOPHAGEAL ECHOCARDIOGRAM (TEE);  Surgeon: Acie Fredrickson Wonda Cheng, MD;  Location: [place];  Service:  Cardiovascular;  Laterality: N/A;  . UMBILICAL HERNIA REPAIR    . VIDEO ASSISTED THORACOSCOPY Left 01/24/2014   Procedure: VIDEO ASSISTED THORACOSCOPY;  Surgeon: Gaye Pollack, MD;  Location[facility];  Service: Thoracic;  Laterality: Left;  VATS/open lung biopsy  . VIDEO BRONCHOSCOPY Bilateral 12/29/2013   Procedure: VIDEO BRONCHOSCOPY WITHOUT FLUORO;  Surgeon: Elsie Stain, MD;  Location: [facility];  Service: Endoscopy;  Laterality: Bilateral;  . VIDEO BRONCHOSCOPY N/A 01/24/2014   Procedure: VIDEO BRONCHOSCOPY;  Surgeon: Gaye Pollack, MD;  Location: [facility] OR;  Service: Thoracic;  Laterality: N/A;    Family History  Problem Relation Age of Onset  . Cancer Mother   . Stroke Father   . Heart disease Father   . Seizures Sister   . Diabetes Sister   . Anesthesia problems Neg Hx   . Hypotension Neg Hx   . Malignant hyperthermia Neg Hx   . Pseudochol deficiency Neg Hx     Social History   Socioeconomic History  . Marital status: Widowed    Spouse name: Not on file  . Number of children: Not on file  . Years of education: Not on file  . Highest education level: Not on file  Occupational History  . Not on file  Tobacco Use  . Smoking status: Former Smoker    Types: Cigars    Quit date: 09/01/2006    Years since quitting: 13.3  . Smokeless tobacco: Never Used  Substance and Sexual Activity  . 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 Drug use: No  . Sexual activity: Never  Other Topics Concern  . Not on file  Social History Narrative  . Not on file   Social Determinants of Health   Financial Resource Strain:   . Difficulty of Paying Living Expenses:   Food Insecurity:   . Worried About Charity fundraiser in the Last Year:   . Arboriculturist in the Last Year:   Transportation Needs:   . Film/video editor (Medical):   Marland Kitchen Lack of Transportation (Non-Medical):   Physical Activity:   . Days of Exercise per  Week:   . Minutes of  Exercise per Session:   Stress:   . Feeling of Stress :   Social Connections:   . Frequency of Communication with Friends and Family:   . Frequency of Social Gatherings with Friends and Family:   . Attends Religious Services:   . Active Member of Clubs or Organizations:   . Attends Archivist Meetings:   Marland Kitchen Marital Status:   Intimate Partner Violence:   . Fear of Current or Ex-Partner:   . Emotionally Abused:   Marland Kitchen Physically Abused:   . Sexually Abused:     Outpatient Medications Prior to Visit  Medication Sig Dispense Refill  . albuterol (PROVENTIL) (2.5 MG/3ML) 0.083% nebulizer solution Take 3 mLs (2.5 mg total) by nebulization 2 (two) times daily. And as needed 75 mL 5  . atorvastatin (LIPITOR) 40 MG tablet Take 40 mg by mouth daily.    . Azelastine HCl 0.15 % SOLN Place 2 sprays into both nostrils 2 (two) times daily.    . BD PEN NEEDLE NANO U/F 32G X 4 MM MISC     . docusate sodium (COLACE) 100 MG capsule Take 100 mg by mouth 2 (two) times daily.    . DULERA 200-5 MCG/ACT AERO     . famotidine (PEPCID) 20 MG tablet TAKE 2 TABLETS BY MOUTH TWICE DAILY 120 tablet 5  . ferrous sulfate 325 (65 FE) MG tablet Take 325 mg by mouth daily.    . Glucagon, rDNA, (GLUCAGON EMERGENCY) 1 MG KIT     . hydrochlorothiazide (HYDRODIURIL) 25 MG tablet Take 25 mg by mouth daily.    Marland Kitchen ipratropium-albuterol (DUONEB) 0.5-2.5 (3) MG/3ML SOLN     . isosorbide mononitrate (IMDUR) 120 MG 24 hr tablet Take 120 mg by mouth daily.    Marland Kitchen latanoprost (XALATAN) 0.005 % ophthalmic solution Administer 1 drop to both eyes nightly.    . metolazone (ZAROXOLYN) 2.5 MG tablet Take 1 tablet (2.5 mg total) by mouth 2 (two) times a week. (Patient taking differently: Take 2.5 mg by mouth every Monday, Wednesday, and Friday. ) 30 tablet 3  . metoprolol succinate (TOPROL-XL) 50 MG 24 hr tablet Take 50 mg by mouth daily.    Marland Kitchen MOVANTIK 25 MG TABS tablet SMARTSIG:1 Tablet(s) By Mouth Daily    . nitroGLYCERIN  (NITROSTAT) 0.4 MG SL tablet Place 0.4 mg under the tongue every 5 (five) minutes as needed for chest pain.     . OXYGEN Inhale into the lungs. 2 liters at bedtime    . pantoprazole (PROTONIX) 40 MG tablet Take 40 mg by mouth daily.     . Potassium Chloride ER 20 MEQ TBCR Take 40 mEq by mouth 2 (two) times daily.     . pregabalin (LYRICA) 75 MG capsule Take 1 capsule (75 mg total) by mouth 2 (two) times daily. 60 capsule 0  . RELISTOR 150 MG TABS Take 3 tablets by mouth every morning.    . sulfamethoxazole-trimethoprim (BACTRIM DS) 800-160 MG tablet Take 1 tablet by mouth 2 (two) times daily. 28 tablet 1  . torsemide (DEMADEX) 20 MG tablet     . Vitamin D, Ergocalciferol, (DRISDOL) 1.25 MG (50000 UNIT) CAPS capsule TAKE 1 CAPSULE BY MOUTH DAILY 90 capsule 1  . XARELTO 15 MG TABS tablet TAKE 1 TABLET BY MOUTH ONCE DAILY WITH EVENING MEAL (Patient taking differently: Take 15 mg by mouth daily after supper. ) 90 tablet 2  . clopidogrel (PLAVIX) 75 MG tablet TAKE 1  TABLET BY MOUTH ONCE DAILY 30 tablet 0  . LEVEMIR FLEXTOUCH 100 UNIT/ML FlexPen INJECT SUB-Q 40 UNITS IN THE MORNING AND30 UNITS BEFORE SUPPER 30 mL 2  . XTAMPZA ER 27 MG C12A Take 1 capsule by mouth 2 (two) times daily.    Marland Kitchen oxyCODONE (OXYCONTIN) 15 mg 12 hr tablet Take 15 mg by mouth every 12 (twelve) hours.     No facility-administered medications prior to visit.    Allergies  Allergen Reactions  . Hydrocodone Itching  . Morphine Itching  . Duloxetine Hcl Other (See Comments)  . Codeine Itching  . Penicillins Itching and Rash    DID THE REACTION INVOLVE: Swelling of the face/tongue/throat, SOB, or low BP? No Sudden or severe rash/hives, skin peeling, or the inside of the mouth or nose? No Did it require medical treatment? No When did it last happen?unknown If all above answers are "NO", may proceed with cephalosporin use.     ROS Review of Systems  Constitutional: Negative for chills, diaphoresis, fatigue and fever.   HENT: Negative for congestion, ear pain and sore throat.   Respiratory: Negative for cough and shortness of breath.   Cardiovascular: Negative for chest pain and leg swelling.  Gastrointestinal: Negative for abdominal pain, constipation, diarrhea, nausea and vomiting.  Genitourinary: Negative for dysuria and urgency.  Musculoskeletal: Negative for arthralgias and myalgias.  Neurological: Negative for dizziness and headaches.  Psychiatric/Behavioral: Negative for dysphoric mood.      Objective:    Physical Exam  Constitutional: He appears well-developed and well-nourished.  Cardiovascular: Normal rate, regular rhythm and normal heart sounds.  Pulmonary/Chest: Effort normal and breath sounds normal.  Musculoskeletal:        General: Edema present.     Comments: 1+ bilaterally.  Neurological: He is alert.  Skin:  Proximal left lower leg has 2-3 sores that are less than 5 mm in diameter.  The depth has decreased.  No bleeding.  No weeping. Right lower leg has no open sores.  Psychiatric: He has a normal mood and affect. His behavior is normal.    BP 138/68   Pulse 92   Temp (!) 97.1 F (36.2 C)   Resp 18   Ht _0  (1.702 m)   Wt 262 lb 3.2 oz (118.9 kg)   BMI 41.07 kg/m  Wt Readings from Last 3 Encounters:  12/12/19 262 lb 3.2 oz (118.9 kg)  11/21/19 263 lb (119.3 kg)  11/17/19 264 lb 12.8 oz (120.1 kg)     Health Maintenance Due  Topic Date Due  . Hepatitis C Screening  Never done  . OPHTHALMOLOGY EXAM  Never done  . URINE MICROALBUMIN  Never done  . TETANUS/TDAP  Never done  . COLONOSCOPY  Never done  . PNA vac Low Risk Adult (2 of 2 - PCV13) 12/01/2014  . FOOT EXAM  02/28/2016    There are no preventive care reminders to display for this patient.  Lab Results  Component Value Date   TSH 5.170 (H) 12/13/2019   Lab Results  Component Value Date   WBC 7.5 12/13/2019   HGB 14.3 12/13/2019   HCT 43.0 12/13/2019   MCV 87 12/13/2019   PLT 241 12/13/2019    Lab Results  Component Value Date   NA 142 12/13/2019   K 4.3 12/13/2019   CO2 24 12/13/2019   GLUCOSE 102 (H) 12/13/2019   BUN 28 (H) 12/13/2019   CREATININE 2.45 (H) 12/13/2019   BILITOT 0.4 12/13/2019   ALKPHOS  113 12/13/2019   AST 17 12/13/2019   ALT 10 12/13/2019   PROT 6.8 12/13/2019   ALBUMIN 4.1 12/13/2019   CALCIUM 9.2 12/13/2019   ANIONGAP 6 09/11/2018   Lab Results  Component Value Date   CHOL 117 12/13/2019   Lab Results  Component Value Date   HDL 40 12/13/2019   Lab Results  Component Value Date   LDLCALC 55 12/13/2019   Lab Results  Component Value Date   TRIG 124 12/13/2019   Lab Results  Component Value Date   CHOLHDL 2.9 12/13/2019   Lab Results  Component Value Date   HGBA1C 8.6 (H) 12/13/2019      Assessment & Plan:  1. Hypertensive heart disease with heart failure ([place]) Well controlled.  No changes to medicines.  Continue to work on eating a healthy diet and exercise.  Labs drawn today.  - Amb ref to Medical Nutrition Therapy-MNT - CBC - Comprehensive metabolic panel  2. Mixed hyperlipidemia Well controlled.  No changes to medicines.  Continue to work on eating a healthy diet and exercise.  Labs drawn today.  - Lipid panel - Cardiovascular Risk Assessment  3. Type 2 diabetes mellitus with diabetic neuropathy, with long-term current use of insulin (HCC) CHANGE LEVEMIR TO 70 U IN AM (AFTER BREAKFAST) START NOVOLOG 10 U BEFORE LUNCH AND SUPPER.  - Amb Referral to Clinical Pharmacist - Amb ref to Medical Nutrition Therapy-MNT - insulin aspart (NOVOLOG) 100 UNIT/ML injection; Inject 10 Units into the skin 2 (two) times daily before lunch and supper. SAMPLES GIVEN.  Dispense: 10 mL; Refill: 11 - Hemoglobin A1c  4. Other specified hypothyroidism Adjust according to lab. - TSH  5. Chronic pain of both knees The current medical regimen is effective;  continue present plan and medications.  6. Uncomplicated opioid dependence  ([place]) The current medical regimen is effective;  continue present plan and medications.  7. Drug induced constipation Currently on two medicines for this would like to decrease. Recommend Sherre Poot, Pharmacist discuss this with patient.  8. Long standing persistent atrial fibrillation (HCC) The current medical regimen is effective;  continue present plan and medications.  9. Anemia of chronic disease/iron deficiency anemia.  Continue ferrous sulfate.  10. Ulcers of both lower legs (HCC) Resolved. Stop Unna boots.  11. Acquired thrombophilia ([place]) Due to xarelto and plavix. The current medical regimen is effective;  continue present plan and medications.    Meds ordered this encounter  Medications  . XTAMPZA ER 27 MG C12A    Sig: Take 1 capsule by mouth 2 (two) times daily.    Dispense:  60 capsule    Refill:  0  . clopidogrel (PLAVIX) 75 MG tablet    Sig: Take 1 tablet (75 mg total) by mouth daily.    Dispense:  30 tablet    Refill:  5  . insulin detemir (LEVEMIR FLEXTOUCH) 100 UNIT/ML FlexPen    Sig: Inject 70 Units into the skin daily.    Dispense:  30 mL    Refill:  2  . insulin aspart (NOVOLOG) 100 UNIT/ML injection    Sig: Inject 10 Units into the skin 2 (two) times daily before lunch and supper. SAMPLES GIVEN.    Dispense:  10 mL    Refill:  11    Follow-up: Return in about 3 months (around 03/12/2020) for fasting.Rochel Brome, MD

## 2019-12-09 DIAGNOSIS — E039 Hypothyroidism, unspecified: Secondary | ICD-10-CM | POA: Diagnosis not present

## 2019-12-09 DIAGNOSIS — E1151 Type 2 diabetes mellitus with diabetic peripheral angiopathy without gangrene: Secondary | ICD-10-CM | POA: Diagnosis not present

## 2019-12-09 DIAGNOSIS — B9562 Methicillin resistant Staphylococcus aureus infection as the cause of diseases classified elsewhere: Secondary | ICD-10-CM | POA: Diagnosis not present

## 2019-12-09 DIAGNOSIS — I252 Old myocardial infarction: Secondary | ICD-10-CM | POA: Diagnosis not present

## 2019-12-09 DIAGNOSIS — K219 Gastro-esophageal reflux disease without esophagitis: Secondary | ICD-10-CM | POA: Diagnosis not present

## 2019-12-09 DIAGNOSIS — I5032 Chronic diastolic (congestive) heart failure: Secondary | ICD-10-CM | POA: Diagnosis not present

## 2019-12-09 DIAGNOSIS — I25119 Atherosclerotic heart disease of native coronary artery with unspecified angina pectoris: Secondary | ICD-10-CM | POA: Diagnosis not present

## 2019-12-09 DIAGNOSIS — L97919 Non-pressure chronic ulcer of unspecified part of right lower leg with unspecified severity: Secondary | ICD-10-CM | POA: Diagnosis not present

## 2019-12-09 DIAGNOSIS — I824Z2 Acute embolism and thrombosis of unspecified deep veins of left distal lower extremity: Secondary | ICD-10-CM | POA: Diagnosis not present

## 2019-12-09 DIAGNOSIS — M170 Bilateral primary osteoarthritis of knee: Secondary | ICD-10-CM | POA: Diagnosis not present

## 2019-12-09 DIAGNOSIS — I110 Hypertensive heart disease with heart failure: Secondary | ICD-10-CM | POA: Diagnosis not present

## 2019-12-09 DIAGNOSIS — I872 Venous insufficiency (chronic) (peripheral): Secondary | ICD-10-CM | POA: Diagnosis not present

## 2019-12-09 DIAGNOSIS — L02415 Cutaneous abscess of right lower limb: Secondary | ICD-10-CM | POA: Diagnosis not present

## 2019-12-09 DIAGNOSIS — J9620 Acute and chronic respiratory failure, unspecified whether with hypoxia or hypercapnia: Secondary | ICD-10-CM | POA: Diagnosis not present

## 2019-12-09 DIAGNOSIS — J449 Chronic obstructive pulmonary disease, unspecified: Secondary | ICD-10-CM | POA: Diagnosis not present

## 2019-12-09 DIAGNOSIS — Z8673 Personal history of transient ischemic attack (TIA), and cerebral infarction without residual deficits: Secondary | ICD-10-CM | POA: Diagnosis not present

## 2019-12-09 DIAGNOSIS — E785 Hyperlipidemia, unspecified: Secondary | ICD-10-CM | POA: Diagnosis not present

## 2019-12-09 DIAGNOSIS — E1142 Type 2 diabetes mellitus with diabetic polyneuropathy: Secondary | ICD-10-CM | POA: Diagnosis not present

## 2019-12-09 DIAGNOSIS — G2581 Restless legs syndrome: Secondary | ICD-10-CM | POA: Diagnosis not present

## 2019-12-09 DIAGNOSIS — L03115 Cellulitis of right lower limb: Secondary | ICD-10-CM | POA: Diagnosis not present

## 2019-12-09 DIAGNOSIS — M797 Fibromyalgia: Secondary | ICD-10-CM | POA: Diagnosis not present

## 2019-12-09 DIAGNOSIS — Z9181 History of falling: Secondary | ICD-10-CM | POA: Diagnosis not present

## 2019-12-09 DIAGNOSIS — G4733 Obstructive sleep apnea (adult) (pediatric): Secondary | ICD-10-CM | POA: Diagnosis not present

## 2019-12-09 DIAGNOSIS — I482 Chronic atrial fibrillation, unspecified: Secondary | ICD-10-CM | POA: Diagnosis not present

## 2019-12-12 ENCOUNTER — Ambulatory Visit (INDEPENDENT_AMBULATORY_CARE_PROVIDER_SITE_OTHER): Payer: Medicare Other | Admitting: Family Medicine

## 2019-12-12 ENCOUNTER — Other Ambulatory Visit: Payer: Self-pay

## 2019-12-12 ENCOUNTER — Encounter: Payer: Self-pay | Admitting: Family Medicine

## 2019-12-12 ENCOUNTER — Telehealth: Payer: Self-pay | Admitting: Family Medicine

## 2019-12-12 VITALS — BP 138/68 | HR 92 | Temp 97.1°F | Resp 18 | Ht 67.0 in | Wt 262.2 lb

## 2019-12-12 DIAGNOSIS — E038 Other specified hypothyroidism: Secondary | ICD-10-CM | POA: Diagnosis not present

## 2019-12-12 DIAGNOSIS — E782 Mixed hyperlipidemia: Secondary | ICD-10-CM | POA: Diagnosis not present

## 2019-12-12 DIAGNOSIS — M25562 Pain in left knee: Secondary | ICD-10-CM

## 2019-12-12 DIAGNOSIS — E1140 Type 2 diabetes mellitus with diabetic neuropathy, unspecified: Secondary | ICD-10-CM | POA: Diagnosis not present

## 2019-12-12 DIAGNOSIS — I4811 Longstanding persistent atrial fibrillation: Secondary | ICD-10-CM

## 2019-12-12 DIAGNOSIS — Z794 Long term (current) use of insulin: Secondary | ICD-10-CM

## 2019-12-12 DIAGNOSIS — D6869 Other thrombophilia: Secondary | ICD-10-CM

## 2019-12-12 DIAGNOSIS — D638 Anemia in other chronic diseases classified elsewhere: Secondary | ICD-10-CM

## 2019-12-12 DIAGNOSIS — L97919 Non-pressure chronic ulcer of unspecified part of right lower leg with unspecified severity: Secondary | ICD-10-CM

## 2019-12-12 DIAGNOSIS — I110 Hypertensive heart disease with heart failure: Secondary | ICD-10-CM | POA: Diagnosis not present

## 2019-12-12 DIAGNOSIS — M25561 Pain in right knee: Secondary | ICD-10-CM | POA: Diagnosis not present

## 2019-12-12 DIAGNOSIS — L97929 Non-pressure chronic ulcer of unspecified part of left lower leg with unspecified severity: Secondary | ICD-10-CM

## 2019-12-12 DIAGNOSIS — G8929 Other chronic pain: Secondary | ICD-10-CM

## 2019-12-12 DIAGNOSIS — K5903 Drug induced constipation: Secondary | ICD-10-CM

## 2019-12-12 DIAGNOSIS — F1120 Opioid dependence, uncomplicated: Secondary | ICD-10-CM

## 2019-12-12 MED ORDER — XTAMPZA ER 27 MG PO C12A: 1.0000 | EXTENDED_RELEASE_CAPSULE | Freq: Two times a day (BID) | ORAL | 0 refills | Status: DC

## 2019-12-12 MED ORDER — CLOPIDOGREL BISULFATE 75 MG PO TABS: 75.0000 mg | ORAL_TABLET | Freq: Every day | ORAL | 5 refills | Status: DC

## 2019-12-12 MED ORDER — LEVEMIR FLEXTOUCH 100 UNIT/ML ~~LOC~~ SOPN: 70.0000 [IU] | PEN_INJECTOR | Freq: Every day | SUBCUTANEOUS | 2 refills | Status: DC

## 2019-12-12 MED ORDER — INSULIN ASPART 100 UNIT/ML ~~LOC~~ SOLN: 10.0000 [IU] | Freq: Two times a day (BID) | SUBCUTANEOUS | 11 refills | Status: DC

## 2019-12-12 NOTE — Patient Instructions (Signed)
DIABETES CHANGE LEVEMIR TO 70 U IN AM (AFTER BREAKFAST) START NOVOLOG 10 U BEFORE LUNCH AND SUPPER.  RETURN FOR LABS IN AM.

## 2019-12-12 NOTE — Progress Notes (Signed)
  Chronic Care Management   Outreach Note  12/12/2019 Name: Jon [name] MRN: [id number] DOB: 03/29/1951  Referred by: Rochel Brome, MD Reason for referral : No chief complaint on file.   An unsuccessful telephone outreach was attempted today. The patient was referred to the pharmacist for assistance with care management and care coordination.   Follow Up Plan:   Earney Hamburg Upstream Scheduler

## 2019-12-13 ENCOUNTER — Other Ambulatory Visit: Payer: Medicare Other

## 2019-12-13 ENCOUNTER — Other Ambulatory Visit: Payer: Self-pay

## 2019-12-13 ENCOUNTER — Ambulatory Visit: Payer: Medicare Other | Admitting: Family Medicine

## 2019-12-13 DIAGNOSIS — E782 Mixed hyperlipidemia: Secondary | ICD-10-CM | POA: Diagnosis not present

## 2019-12-13 DIAGNOSIS — E1121 Type 2 diabetes mellitus with diabetic nephropathy: Secondary | ICD-10-CM | POA: Diagnosis not present

## 2019-12-13 DIAGNOSIS — E038 Other specified hypothyroidism: Secondary | ICD-10-CM | POA: Diagnosis not present

## 2019-12-13 DIAGNOSIS — I110 Hypertensive heart disease with heart failure: Secondary | ICD-10-CM | POA: Diagnosis not present

## 2019-12-14 DIAGNOSIS — Z9181 History of falling: Secondary | ICD-10-CM | POA: Diagnosis not present

## 2019-12-14 DIAGNOSIS — E785 Hyperlipidemia, unspecified: Secondary | ICD-10-CM | POA: Diagnosis not present

## 2019-12-14 DIAGNOSIS — J9620 Acute and chronic respiratory failure, unspecified whether with hypoxia or hypercapnia: Secondary | ICD-10-CM | POA: Diagnosis not present

## 2019-12-14 DIAGNOSIS — J449 Chronic obstructive pulmonary disease, unspecified: Secondary | ICD-10-CM | POA: Diagnosis not present

## 2019-12-14 DIAGNOSIS — I482 Chronic atrial fibrillation, unspecified: Secondary | ICD-10-CM | POA: Diagnosis not present

## 2019-12-14 DIAGNOSIS — L03115 Cellulitis of right lower limb: Secondary | ICD-10-CM | POA: Diagnosis not present

## 2019-12-14 DIAGNOSIS — I252 Old myocardial infarction: Secondary | ICD-10-CM | POA: Diagnosis not present

## 2019-12-14 DIAGNOSIS — B9562 Methicillin resistant Staphylococcus aureus infection as the cause of diseases classified elsewhere: Secondary | ICD-10-CM | POA: Diagnosis not present

## 2019-12-14 DIAGNOSIS — I872 Venous insufficiency (chronic) (peripheral): Secondary | ICD-10-CM | POA: Diagnosis not present

## 2019-12-14 DIAGNOSIS — M797 Fibromyalgia: Secondary | ICD-10-CM | POA: Diagnosis not present

## 2019-12-14 DIAGNOSIS — Z8673 Personal history of transient ischemic attack (TIA), and cerebral infarction without residual deficits: Secondary | ICD-10-CM | POA: Diagnosis not present

## 2019-12-14 DIAGNOSIS — K219 Gastro-esophageal reflux disease without esophagitis: Secondary | ICD-10-CM | POA: Diagnosis not present

## 2019-12-14 DIAGNOSIS — L02415 Cutaneous abscess of right lower limb: Secondary | ICD-10-CM | POA: Diagnosis not present

## 2019-12-14 DIAGNOSIS — G2581 Restless legs syndrome: Secondary | ICD-10-CM | POA: Diagnosis not present

## 2019-12-14 DIAGNOSIS — M170 Bilateral primary osteoarthritis of knee: Secondary | ICD-10-CM | POA: Diagnosis not present

## 2019-12-14 DIAGNOSIS — I5032 Chronic diastolic (congestive) heart failure: Secondary | ICD-10-CM | POA: Diagnosis not present

## 2019-12-14 DIAGNOSIS — I110 Hypertensive heart disease with heart failure: Secondary | ICD-10-CM | POA: Diagnosis not present

## 2019-12-14 DIAGNOSIS — I25119 Atherosclerotic heart disease of native coronary artery with unspecified angina pectoris: Secondary | ICD-10-CM | POA: Diagnosis not present

## 2019-12-14 DIAGNOSIS — E039 Hypothyroidism, unspecified: Secondary | ICD-10-CM | POA: Diagnosis not present

## 2019-12-14 DIAGNOSIS — L97919 Non-pressure chronic ulcer of unspecified part of right lower leg with unspecified severity: Secondary | ICD-10-CM | POA: Diagnosis not present

## 2019-12-14 DIAGNOSIS — E1151 Type 2 diabetes mellitus with diabetic peripheral angiopathy without gangrene: Secondary | ICD-10-CM | POA: Diagnosis not present

## 2019-12-14 DIAGNOSIS — G4733 Obstructive sleep apnea (adult) (pediatric): Secondary | ICD-10-CM | POA: Diagnosis not present

## 2019-12-14 DIAGNOSIS — I824Z2 Acute embolism and thrombosis of unspecified deep veins of left distal lower extremity: Secondary | ICD-10-CM | POA: Diagnosis not present

## 2019-12-14 DIAGNOSIS — E1142 Type 2 diabetes mellitus with diabetic polyneuropathy: Secondary | ICD-10-CM | POA: Diagnosis not present

## 2019-12-14 LAB — COMPREHENSIVE METABOLIC PANEL
ALT: 10 IU/L (ref 0–44)
AST: 17 IU/L (ref 0–40)
Albumin/Globulin Ratio: 1.5 (ref 1.2–2.2)
Albumin: 4.1 g/dL (ref 3.7–4.7)
Alkaline Phosphatase: 113 IU/L (ref 39–117)
BUN/Creatinine Ratio: 11 (ref 10–24)
BUN: 28 mg/dL — ABNORMAL HIGH (ref 8–27)
Bilirubin Total: 0.4 mg/dL (ref 0.0–1.2)
CO2: 24 mmol/L (ref 20–29)
Calcium: 9.2 mg/dL (ref 8.6–10.2)
Chloride: 103 mmol/L (ref 96–106)
Creatinine, Ser: 2.45 mg/dL — ABNORMAL HIGH (ref 0.76–1.27)
GFR calc Af Amer: 29 mL/min/{1.73_m2} — ABNORMAL LOW (ref >59)
GFR calc non Af Amer: 26 mL/min/{1.73_m2} — ABNORMAL LOW (ref >59)
Globulin, Total: 2.7 g/dL (ref 1.5–4.5)
Glucose: 102 mg/dL — ABNORMAL HIGH (ref 65–99)
Potassium: 4.3 mmol/L (ref 3.5–5.2)
Sodium: 142 mmol/L (ref 134–144)
Total Protein: 6.8 g/dL (ref 6.0–8.5)

## 2019-12-14 LAB — HEMOGLOBIN A1C
Est. average glucose Bld gHb Est-mCnc: 200 mg/dL
Hgb A1c MFr Bld: 8.6 % — ABNORMAL HIGH (ref 4.8–5.6)

## 2019-12-14 LAB — CARDIOVASCULAR RISK ASSESSMENT

## 2019-12-14 LAB — LIPID PANEL
Chol/HDL Ratio: 2.9 ratio (ref 0.0–5.0)
Cholesterol, Total: 117 mg/dL (ref 100–199)
HDL: 40 mg/dL (ref >39)
LDL Chol Calc (NIH): 55 mg/dL (ref 0–99)
Triglycerides: 124 mg/dL (ref 0–149)
VLDL Cholesterol Cal: 22 mg/dL (ref 5–40)

## 2019-12-14 LAB — CBC
Hematocrit: 43 % (ref 37.5–51.0)
Hemoglobin: 14.3 g/dL (ref 13.0–17.7)
MCH: 29.1 pg (ref 26.6–33.0)
MCHC: 33.3 g/dL (ref 31.5–35.7)
MCV: 87 fL (ref 79–97)
Platelets: 241 10*3/uL (ref 150–450)
RBC: 4.92 x10E6/uL (ref 4.14–5.80)
RDW: 13.5 % (ref 11.6–15.4)
WBC: 7.5 10*3/uL (ref 3.4–10.8)

## 2019-12-14 LAB — TSH: TSH: 5.17 u[IU]/mL — ABNORMAL HIGH (ref 0.450–4.500)

## 2019-12-17 ENCOUNTER — Encounter: Payer: Self-pay | Admitting: Family Medicine

## 2019-12-17 DIAGNOSIS — F1120 Opioid dependence, uncomplicated: Secondary | ICD-10-CM | POA: Insufficient documentation

## 2019-12-17 DIAGNOSIS — L97919 Non-pressure chronic ulcer of unspecified part of right lower leg with unspecified severity: Secondary | ICD-10-CM | POA: Insufficient documentation

## 2019-12-17 DIAGNOSIS — D6869 Other thrombophilia: Secondary | ICD-10-CM

## 2019-12-17 DIAGNOSIS — L97929 Non-pressure chronic ulcer of unspecified part of left lower leg with unspecified severity: Secondary | ICD-10-CM | POA: Insufficient documentation

## 2019-12-17 DIAGNOSIS — D638 Anemia in other chronic diseases classified elsewhere: Secondary | ICD-10-CM | POA: Insufficient documentation

## 2019-12-17 HISTORY — DX: Opioid dependence, uncomplicated: F11.20

## 2019-12-17 HISTORY — DX: Other thrombophilia: D68.69

## 2019-12-17 HISTORY — DX: Non-pressure chronic ulcer of unspecified part of right lower leg with unspecified severity: L97.919

## 2019-12-17 HISTORY — DX: Non-pressure chronic ulcer of unspecified part of left lower leg with unspecified severity: L97.929

## 2019-12-20 DIAGNOSIS — L02415 Cutaneous abscess of right lower limb: Secondary | ICD-10-CM | POA: Diagnosis not present

## 2019-12-20 DIAGNOSIS — I824Z2 Acute embolism and thrombosis of unspecified deep veins of left distal lower extremity: Secondary | ICD-10-CM | POA: Diagnosis not present

## 2019-12-20 DIAGNOSIS — M797 Fibromyalgia: Secondary | ICD-10-CM | POA: Diagnosis not present

## 2019-12-20 DIAGNOSIS — L03115 Cellulitis of right lower limb: Secondary | ICD-10-CM | POA: Diagnosis not present

## 2019-12-20 DIAGNOSIS — L97919 Non-pressure chronic ulcer of unspecified part of right lower leg with unspecified severity: Secondary | ICD-10-CM | POA: Diagnosis not present

## 2019-12-20 DIAGNOSIS — Z8673 Personal history of transient ischemic attack (TIA), and cerebral infarction without residual deficits: Secondary | ICD-10-CM | POA: Diagnosis not present

## 2019-12-20 DIAGNOSIS — I482 Chronic atrial fibrillation, unspecified: Secondary | ICD-10-CM | POA: Diagnosis not present

## 2019-12-20 DIAGNOSIS — M170 Bilateral primary osteoarthritis of knee: Secondary | ICD-10-CM | POA: Diagnosis not present

## 2019-12-20 DIAGNOSIS — B9562 Methicillin resistant Staphylococcus aureus infection as the cause of diseases classified elsewhere: Secondary | ICD-10-CM | POA: Diagnosis not present

## 2019-12-20 DIAGNOSIS — I25119 Atherosclerotic heart disease of native coronary artery with unspecified angina pectoris: Secondary | ICD-10-CM | POA: Diagnosis not present

## 2019-12-20 DIAGNOSIS — J449 Chronic obstructive pulmonary disease, unspecified: Secondary | ICD-10-CM | POA: Diagnosis not present

## 2019-12-20 DIAGNOSIS — J9620 Acute and chronic respiratory failure, unspecified whether with hypoxia or hypercapnia: Secondary | ICD-10-CM | POA: Diagnosis not present

## 2019-12-20 DIAGNOSIS — I872 Venous insufficiency (chronic) (peripheral): Secondary | ICD-10-CM | POA: Diagnosis not present

## 2019-12-20 DIAGNOSIS — E039 Hypothyroidism, unspecified: Secondary | ICD-10-CM | POA: Diagnosis not present

## 2019-12-20 DIAGNOSIS — K219 Gastro-esophageal reflux disease without esophagitis: Secondary | ICD-10-CM | POA: Diagnosis not present

## 2019-12-20 DIAGNOSIS — Z9181 History of falling: Secondary | ICD-10-CM | POA: Diagnosis not present

## 2019-12-20 DIAGNOSIS — I252 Old myocardial infarction: Secondary | ICD-10-CM | POA: Diagnosis not present

## 2019-12-20 DIAGNOSIS — G2581 Restless legs syndrome: Secondary | ICD-10-CM | POA: Diagnosis not present

## 2019-12-20 DIAGNOSIS — E1142 Type 2 diabetes mellitus with diabetic polyneuropathy: Secondary | ICD-10-CM | POA: Diagnosis not present

## 2019-12-20 DIAGNOSIS — G4733 Obstructive sleep apnea (adult) (pediatric): Secondary | ICD-10-CM | POA: Diagnosis not present

## 2019-12-20 DIAGNOSIS — E1151 Type 2 diabetes mellitus with diabetic peripheral angiopathy without gangrene: Secondary | ICD-10-CM | POA: Diagnosis not present

## 2019-12-20 DIAGNOSIS — E785 Hyperlipidemia, unspecified: Secondary | ICD-10-CM | POA: Diagnosis not present

## 2019-12-20 DIAGNOSIS — I5032 Chronic diastolic (congestive) heart failure: Secondary | ICD-10-CM | POA: Diagnosis not present

## 2019-12-20 DIAGNOSIS — I110 Hypertensive heart disease with heart failure: Secondary | ICD-10-CM | POA: Diagnosis not present

## 2019-12-21 DIAGNOSIS — L02415 Cutaneous abscess of right lower limb: Secondary | ICD-10-CM | POA: Diagnosis not present

## 2019-12-21 DIAGNOSIS — Z8673 Personal history of transient ischemic attack (TIA), and cerebral infarction without residual deficits: Secondary | ICD-10-CM | POA: Diagnosis not present

## 2019-12-21 DIAGNOSIS — I252 Old myocardial infarction: Secondary | ICD-10-CM | POA: Diagnosis not present

## 2019-12-21 DIAGNOSIS — J449 Chronic obstructive pulmonary disease, unspecified: Secondary | ICD-10-CM | POA: Diagnosis not present

## 2019-12-21 DIAGNOSIS — E039 Hypothyroidism, unspecified: Secondary | ICD-10-CM | POA: Diagnosis not present

## 2019-12-21 DIAGNOSIS — B9562 Methicillin resistant Staphylococcus aureus infection as the cause of diseases classified elsewhere: Secondary | ICD-10-CM | POA: Diagnosis not present

## 2019-12-21 DIAGNOSIS — M797 Fibromyalgia: Secondary | ICD-10-CM | POA: Diagnosis not present

## 2019-12-21 DIAGNOSIS — E1142 Type 2 diabetes mellitus with diabetic polyneuropathy: Secondary | ICD-10-CM | POA: Diagnosis not present

## 2019-12-21 DIAGNOSIS — L97919 Non-pressure chronic ulcer of unspecified part of right lower leg with unspecified severity: Secondary | ICD-10-CM | POA: Diagnosis not present

## 2019-12-21 DIAGNOSIS — E1151 Type 2 diabetes mellitus with diabetic peripheral angiopathy without gangrene: Secondary | ICD-10-CM | POA: Diagnosis not present

## 2019-12-21 DIAGNOSIS — J9620 Acute and chronic respiratory failure, unspecified whether with hypoxia or hypercapnia: Secondary | ICD-10-CM | POA: Diagnosis not present

## 2019-12-21 DIAGNOSIS — K219 Gastro-esophageal reflux disease without esophagitis: Secondary | ICD-10-CM | POA: Diagnosis not present

## 2019-12-21 DIAGNOSIS — M170 Bilateral primary osteoarthritis of knee: Secondary | ICD-10-CM | POA: Diagnosis not present

## 2019-12-21 DIAGNOSIS — I482 Chronic atrial fibrillation, unspecified: Secondary | ICD-10-CM | POA: Diagnosis not present

## 2019-12-21 DIAGNOSIS — Z9181 History of falling: Secondary | ICD-10-CM | POA: Diagnosis not present

## 2019-12-21 DIAGNOSIS — E785 Hyperlipidemia, unspecified: Secondary | ICD-10-CM | POA: Diagnosis not present

## 2019-12-21 DIAGNOSIS — I824Z2 Acute embolism and thrombosis of unspecified deep veins of left distal lower extremity: Secondary | ICD-10-CM | POA: Diagnosis not present

## 2019-12-21 DIAGNOSIS — I872 Venous insufficiency (chronic) (peripheral): Secondary | ICD-10-CM | POA: Diagnosis not present

## 2019-12-21 DIAGNOSIS — L03115 Cellulitis of right lower limb: Secondary | ICD-10-CM | POA: Diagnosis not present

## 2019-12-21 DIAGNOSIS — G4733 Obstructive sleep apnea (adult) (pediatric): Secondary | ICD-10-CM | POA: Diagnosis not present

## 2019-12-21 DIAGNOSIS — I5032 Chronic diastolic (congestive) heart failure: Secondary | ICD-10-CM | POA: Diagnosis not present

## 2019-12-21 DIAGNOSIS — I110 Hypertensive heart disease with heart failure: Secondary | ICD-10-CM | POA: Diagnosis not present

## 2019-12-21 DIAGNOSIS — I25119 Atherosclerotic heart disease of native coronary artery with unspecified angina pectoris: Secondary | ICD-10-CM | POA: Diagnosis not present

## 2019-12-21 DIAGNOSIS — G2581 Restless legs syndrome: Secondary | ICD-10-CM | POA: Diagnosis not present

## 2019-12-23 ENCOUNTER — Other Ambulatory Visit: Payer: Self-pay | Admitting: Family Medicine

## 2019-12-23 DIAGNOSIS — I482 Chronic atrial fibrillation, unspecified: Secondary | ICD-10-CM | POA: Diagnosis not present

## 2019-12-23 DIAGNOSIS — L97919 Non-pressure chronic ulcer of unspecified part of right lower leg with unspecified severity: Secondary | ICD-10-CM | POA: Diagnosis not present

## 2019-12-23 DIAGNOSIS — E1142 Type 2 diabetes mellitus with diabetic polyneuropathy: Secondary | ICD-10-CM | POA: Diagnosis not present

## 2019-12-23 DIAGNOSIS — G2581 Restless legs syndrome: Secondary | ICD-10-CM | POA: Diagnosis not present

## 2019-12-23 DIAGNOSIS — M797 Fibromyalgia: Secondary | ICD-10-CM | POA: Diagnosis not present

## 2019-12-23 DIAGNOSIS — Z9181 History of falling: Secondary | ICD-10-CM | POA: Diagnosis not present

## 2019-12-23 DIAGNOSIS — Z8673 Personal history of transient ischemic attack (TIA), and cerebral infarction without residual deficits: Secondary | ICD-10-CM | POA: Diagnosis not present

## 2019-12-23 DIAGNOSIS — I872 Venous insufficiency (chronic) (peripheral): Secondary | ICD-10-CM | POA: Diagnosis not present

## 2019-12-23 DIAGNOSIS — E039 Hypothyroidism, unspecified: Secondary | ICD-10-CM | POA: Diagnosis not present

## 2019-12-23 DIAGNOSIS — I110 Hypertensive heart disease with heart failure: Secondary | ICD-10-CM | POA: Diagnosis not present

## 2019-12-23 DIAGNOSIS — K219 Gastro-esophageal reflux disease without esophagitis: Secondary | ICD-10-CM | POA: Diagnosis not present

## 2019-12-23 DIAGNOSIS — M170 Bilateral primary osteoarthritis of knee: Secondary | ICD-10-CM | POA: Diagnosis not present

## 2019-12-23 DIAGNOSIS — G4733 Obstructive sleep apnea (adult) (pediatric): Secondary | ICD-10-CM | POA: Diagnosis not present

## 2019-12-23 DIAGNOSIS — Z794 Long term (current) use of insulin: Secondary | ICD-10-CM

## 2019-12-23 DIAGNOSIS — I5032 Chronic diastolic (congestive) heart failure: Secondary | ICD-10-CM | POA: Diagnosis not present

## 2019-12-23 DIAGNOSIS — L03115 Cellulitis of right lower limb: Secondary | ICD-10-CM | POA: Diagnosis not present

## 2019-12-23 DIAGNOSIS — J449 Chronic obstructive pulmonary disease, unspecified: Secondary | ICD-10-CM | POA: Diagnosis not present

## 2019-12-23 DIAGNOSIS — J9620 Acute and chronic respiratory failure, unspecified whether with hypoxia or hypercapnia: Secondary | ICD-10-CM | POA: Diagnosis not present

## 2019-12-23 DIAGNOSIS — I252 Old myocardial infarction: Secondary | ICD-10-CM | POA: Diagnosis not present

## 2019-12-23 DIAGNOSIS — B9562 Methicillin resistant Staphylococcus aureus infection as the cause of diseases classified elsewhere: Secondary | ICD-10-CM | POA: Diagnosis not present

## 2019-12-23 DIAGNOSIS — E1151 Type 2 diabetes mellitus with diabetic peripheral angiopathy without gangrene: Secondary | ICD-10-CM | POA: Diagnosis not present

## 2019-12-23 DIAGNOSIS — I824Z2 Acute embolism and thrombosis of unspecified deep veins of left distal lower extremity: Secondary | ICD-10-CM | POA: Diagnosis not present

## 2019-12-23 DIAGNOSIS — E785 Hyperlipidemia, unspecified: Secondary | ICD-10-CM | POA: Diagnosis not present

## 2019-12-23 DIAGNOSIS — L02415 Cutaneous abscess of right lower limb: Secondary | ICD-10-CM | POA: Diagnosis not present

## 2019-12-23 DIAGNOSIS — I25119 Atherosclerotic heart disease of native coronary artery with unspecified angina pectoris: Secondary | ICD-10-CM | POA: Diagnosis not present

## 2019-12-23 DIAGNOSIS — E1140 Type 2 diabetes mellitus with diabetic neuropathy, unspecified: Secondary | ICD-10-CM

## 2019-12-23 MED ORDER — INSULIN ASPART 100 UNIT/ML ~~LOC~~ SOLN: 10.0000 [IU] | Freq: Two times a day (BID) | SUBCUTANEOUS | 11 refills | Status: DC

## 2019-12-24 DIAGNOSIS — J9610 Chronic respiratory failure, unspecified whether with hypoxia or hypercapnia: Secondary | ICD-10-CM | POA: Diagnosis not present

## 2019-12-26 ENCOUNTER — Other Ambulatory Visit: Payer: Self-pay | Admitting: Family Medicine

## 2019-12-26 DIAGNOSIS — J9610 Chronic respiratory failure, unspecified whether with hypoxia or hypercapnia: Secondary | ICD-10-CM | POA: Diagnosis not present

## 2019-12-27 DIAGNOSIS — I252 Old myocardial infarction: Secondary | ICD-10-CM | POA: Diagnosis not present

## 2019-12-27 DIAGNOSIS — I110 Hypertensive heart disease with heart failure: Secondary | ICD-10-CM | POA: Diagnosis not present

## 2019-12-27 DIAGNOSIS — E039 Hypothyroidism, unspecified: Secondary | ICD-10-CM | POA: Diagnosis not present

## 2019-12-27 DIAGNOSIS — I5032 Chronic diastolic (congestive) heart failure: Secondary | ICD-10-CM | POA: Diagnosis not present

## 2019-12-27 DIAGNOSIS — I824Z2 Acute embolism and thrombosis of unspecified deep veins of left distal lower extremity: Secondary | ICD-10-CM | POA: Diagnosis not present

## 2019-12-27 DIAGNOSIS — I25119 Atherosclerotic heart disease of native coronary artery with unspecified angina pectoris: Secondary | ICD-10-CM | POA: Diagnosis not present

## 2019-12-27 DIAGNOSIS — K219 Gastro-esophageal reflux disease without esophagitis: Secondary | ICD-10-CM | POA: Diagnosis not present

## 2019-12-27 DIAGNOSIS — J9620 Acute and chronic respiratory failure, unspecified whether with hypoxia or hypercapnia: Secondary | ICD-10-CM | POA: Diagnosis not present

## 2019-12-27 DIAGNOSIS — L03115 Cellulitis of right lower limb: Secondary | ICD-10-CM | POA: Diagnosis not present

## 2019-12-27 DIAGNOSIS — B9562 Methicillin resistant Staphylococcus aureus infection as the cause of diseases classified elsewhere: Secondary | ICD-10-CM | POA: Diagnosis not present

## 2019-12-27 DIAGNOSIS — M797 Fibromyalgia: Secondary | ICD-10-CM | POA: Diagnosis not present

## 2019-12-27 DIAGNOSIS — E785 Hyperlipidemia, unspecified: Secondary | ICD-10-CM | POA: Diagnosis not present

## 2019-12-27 DIAGNOSIS — Z9181 History of falling: Secondary | ICD-10-CM | POA: Diagnosis not present

## 2019-12-27 DIAGNOSIS — I872 Venous insufficiency (chronic) (peripheral): Secondary | ICD-10-CM | POA: Diagnosis not present

## 2019-12-27 DIAGNOSIS — M170 Bilateral primary osteoarthritis of knee: Secondary | ICD-10-CM | POA: Diagnosis not present

## 2019-12-27 DIAGNOSIS — E1142 Type 2 diabetes mellitus with diabetic polyneuropathy: Secondary | ICD-10-CM | POA: Diagnosis not present

## 2019-12-27 DIAGNOSIS — G2581 Restless legs syndrome: Secondary | ICD-10-CM | POA: Diagnosis not present

## 2019-12-27 DIAGNOSIS — J449 Chronic obstructive pulmonary disease, unspecified: Secondary | ICD-10-CM | POA: Diagnosis not present

## 2019-12-27 DIAGNOSIS — L97919 Non-pressure chronic ulcer of unspecified part of right lower leg with unspecified severity: Secondary | ICD-10-CM | POA: Diagnosis not present

## 2019-12-27 DIAGNOSIS — E1151 Type 2 diabetes mellitus with diabetic peripheral angiopathy without gangrene: Secondary | ICD-10-CM | POA: Diagnosis not present

## 2019-12-27 DIAGNOSIS — L02415 Cutaneous abscess of right lower limb: Secondary | ICD-10-CM | POA: Diagnosis not present

## 2019-12-27 DIAGNOSIS — G4733 Obstructive sleep apnea (adult) (pediatric): Secondary | ICD-10-CM | POA: Diagnosis not present

## 2019-12-27 DIAGNOSIS — Z8673 Personal history of transient ischemic attack (TIA), and cerebral infarction without residual deficits: Secondary | ICD-10-CM | POA: Diagnosis not present

## 2019-12-27 DIAGNOSIS — I482 Chronic atrial fibrillation, unspecified: Secondary | ICD-10-CM | POA: Diagnosis not present

## 2019-12-29 ENCOUNTER — Telehealth: Payer: Self-pay | Admitting: Family Medicine

## 2019-12-29 NOTE — Progress Notes (Signed)
  Chronic Care Management   Outreach Note  12/29/2019 Name: DAMIEL BARTHOLD MRN: [id number] DOB: [birth year]  Referred by: Rochel Brome, MD Reason for referral : No chief complaint on file.   An unsuccessful telephone outreach was attempted today. The patient was referred to the pharmacist for assistance with care management and care coordination.  This note is not being shared with the patient for the following reason: To respect privacy (The patient or proxy has requested that the information not be shared). Follow Up Plan:   Earney Hamburg Upstream Scheduler

## 2019-12-30 ENCOUNTER — Other Ambulatory Visit: Payer: Self-pay

## 2019-12-30 DIAGNOSIS — L02415 Cutaneous abscess of right lower limb: Secondary | ICD-10-CM | POA: Diagnosis not present

## 2019-12-30 DIAGNOSIS — I4891 Unspecified atrial fibrillation: Secondary | ICD-10-CM | POA: Diagnosis not present

## 2019-12-30 DIAGNOSIS — I491 Atrial premature depolarization: Secondary | ICD-10-CM | POA: Diagnosis not present

## 2019-12-30 DIAGNOSIS — I454 Nonspecific intraventricular block: Secondary | ICD-10-CM | POA: Diagnosis not present

## 2019-12-30 DIAGNOSIS — Z87891 Personal history of nicotine dependence: Secondary | ICD-10-CM | POA: Diagnosis not present

## 2019-12-30 DIAGNOSIS — S301XXA Contusion of abdominal wall, initial encounter: Secondary | ICD-10-CM | POA: Diagnosis not present

## 2019-12-30 DIAGNOSIS — M797 Fibromyalgia: Secondary | ICD-10-CM | POA: Diagnosis not present

## 2019-12-30 DIAGNOSIS — J449 Chronic obstructive pulmonary disease, unspecified: Secondary | ICD-10-CM | POA: Diagnosis not present

## 2019-12-30 DIAGNOSIS — I517 Cardiomegaly: Secondary | ICD-10-CM | POA: Diagnosis not present

## 2019-12-30 DIAGNOSIS — R0789 Other chest pain: Secondary | ICD-10-CM | POA: Diagnosis not present

## 2019-12-30 DIAGNOSIS — Z7901 Long term (current) use of anticoagulants: Secondary | ICD-10-CM | POA: Diagnosis not present

## 2019-12-30 DIAGNOSIS — M170 Bilateral primary osteoarthritis of knee: Secondary | ICD-10-CM | POA: Diagnosis not present

## 2019-12-30 DIAGNOSIS — I252 Old myocardial infarction: Secondary | ICD-10-CM | POA: Diagnosis not present

## 2019-12-30 DIAGNOSIS — I493 Ventricular premature depolarization: Secondary | ICD-10-CM | POA: Diagnosis not present

## 2019-12-30 DIAGNOSIS — L98499 Non-pressure chronic ulcer of skin of other sites with unspecified severity: Secondary | ICD-10-CM | POA: Diagnosis not present

## 2019-12-30 DIAGNOSIS — I482 Chronic atrial fibrillation, unspecified: Secondary | ICD-10-CM | POA: Diagnosis not present

## 2019-12-30 DIAGNOSIS — I872 Venous insufficiency (chronic) (peripheral): Secondary | ICD-10-CM | POA: Diagnosis not present

## 2019-12-30 DIAGNOSIS — I824Z2 Acute embolism and thrombosis of unspecified deep veins of left distal lower extremity: Secondary | ICD-10-CM | POA: Diagnosis not present

## 2019-12-30 DIAGNOSIS — Z8673 Personal history of transient ischemic attack (TIA), and cerebral infarction without residual deficits: Secondary | ICD-10-CM | POA: Diagnosis not present

## 2019-12-30 DIAGNOSIS — G4733 Obstructive sleep apnea (adult) (pediatric): Secondary | ICD-10-CM | POA: Diagnosis not present

## 2019-12-30 DIAGNOSIS — E66813 Obesity, class 3: Secondary | ICD-10-CM | POA: Insufficient documentation

## 2019-12-30 DIAGNOSIS — J9620 Acute and chronic respiratory failure, unspecified whether with hypoxia or hypercapnia: Secondary | ICD-10-CM | POA: Diagnosis not present

## 2019-12-30 DIAGNOSIS — L03115 Cellulitis of right lower limb: Secondary | ICD-10-CM | POA: Diagnosis not present

## 2019-12-30 DIAGNOSIS — L97919 Non-pressure chronic ulcer of unspecified part of right lower leg with unspecified severity: Secondary | ICD-10-CM | POA: Diagnosis not present

## 2019-12-30 DIAGNOSIS — R0902 Hypoxemia: Secondary | ICD-10-CM | POA: Diagnosis not present

## 2019-12-30 DIAGNOSIS — E1142 Type 2 diabetes mellitus with diabetic polyneuropathy: Secondary | ICD-10-CM | POA: Diagnosis not present

## 2019-12-30 DIAGNOSIS — I25119 Atherosclerotic heart disease of native coronary artery with unspecified angina pectoris: Secondary | ICD-10-CM | POA: Diagnosis not present

## 2019-12-30 DIAGNOSIS — I10 Essential (primary) hypertension: Secondary | ICD-10-CM | POA: Diagnosis not present

## 2019-12-30 DIAGNOSIS — E66812 Obesity, class 2: Secondary | ICD-10-CM

## 2019-12-30 DIAGNOSIS — E785 Hyperlipidemia, unspecified: Secondary | ICD-10-CM | POA: Diagnosis not present

## 2019-12-30 DIAGNOSIS — R079 Chest pain, unspecified: Secondary | ICD-10-CM | POA: Diagnosis not present

## 2019-12-30 DIAGNOSIS — I272 Pulmonary hypertension, unspecified: Secondary | ICD-10-CM | POA: Diagnosis not present

## 2019-12-30 DIAGNOSIS — K219 Gastro-esophageal reflux disease without esophagitis: Secondary | ICD-10-CM | POA: Diagnosis not present

## 2019-12-30 DIAGNOSIS — E11622 Type 2 diabetes mellitus with other skin ulcer: Secondary | ICD-10-CM | POA: Diagnosis not present

## 2019-12-30 DIAGNOSIS — I509 Heart failure, unspecified: Secondary | ICD-10-CM | POA: Diagnosis not present

## 2019-12-30 DIAGNOSIS — R0602 Shortness of breath: Secondary | ICD-10-CM | POA: Diagnosis not present

## 2019-12-30 DIAGNOSIS — E1151 Type 2 diabetes mellitus with diabetic peripheral angiopathy without gangrene: Secondary | ICD-10-CM | POA: Diagnosis not present

## 2019-12-30 DIAGNOSIS — Z9181 History of falling: Secondary | ICD-10-CM | POA: Diagnosis not present

## 2019-12-30 DIAGNOSIS — I110 Hypertensive heart disease with heart failure: Secondary | ICD-10-CM | POA: Diagnosis not present

## 2019-12-30 DIAGNOSIS — E1165 Type 2 diabetes mellitus with hyperglycemia: Secondary | ICD-10-CM | POA: Diagnosis not present

## 2019-12-30 DIAGNOSIS — E039 Hypothyroidism, unspecified: Secondary | ICD-10-CM | POA: Diagnosis not present

## 2019-12-30 DIAGNOSIS — I4821 Permanent atrial fibrillation: Secondary | ICD-10-CM | POA: Diagnosis not present

## 2019-12-30 DIAGNOSIS — I5032 Chronic diastolic (congestive) heart failure: Secondary | ICD-10-CM | POA: Diagnosis not present

## 2019-12-30 DIAGNOSIS — B9562 Methicillin resistant Staphylococcus aureus infection as the cause of diseases classified elsewhere: Secondary | ICD-10-CM | POA: Diagnosis not present

## 2019-12-30 DIAGNOSIS — Z794 Long term (current) use of insulin: Secondary | ICD-10-CM | POA: Diagnosis not present

## 2019-12-30 DIAGNOSIS — G2581 Restless legs syndrome: Secondary | ICD-10-CM | POA: Diagnosis not present

## 2019-12-30 DIAGNOSIS — I083 Combined rheumatic disorders of mitral, aortic and tricuspid valves: Secondary | ICD-10-CM | POA: Diagnosis not present

## 2019-12-30 HISTORY — DX: Obesity, class 3: E66.813

## 2019-12-30 HISTORY — DX: Obesity, class 2: E66.812

## 2019-12-30 NOTE — Telephone Encounter (Signed)
Message from the [facility] that Jon [name] has a 4 lb weight gain over the last 2 days.  We reviewed his torsemide 20 mg 3 tabs twice daily and decrease his levemir 65 units daily and novolog 10 units lunch and supper.

## 2019-12-31 DIAGNOSIS — I454 Nonspecific intraventricular block: Secondary | ICD-10-CM | POA: Diagnosis not present

## 2019-12-31 DIAGNOSIS — I4891 Unspecified atrial fibrillation: Secondary | ICD-10-CM | POA: Diagnosis not present

## 2019-12-31 DIAGNOSIS — I493 Ventricular premature depolarization: Secondary | ICD-10-CM | POA: Diagnosis not present

## 2019-12-31 DIAGNOSIS — R0789 Other chest pain: Secondary | ICD-10-CM | POA: Diagnosis not present

## 2019-12-31 DIAGNOSIS — I4821 Permanent atrial fibrillation: Secondary | ICD-10-CM | POA: Diagnosis not present

## 2019-12-31 DIAGNOSIS — I350 Nonrheumatic aortic (valve) stenosis: Secondary | ICD-10-CM | POA: Diagnosis not present

## 2019-12-31 DIAGNOSIS — I509 Heart failure, unspecified: Secondary | ICD-10-CM | POA: Diagnosis not present

## 2019-12-31 DIAGNOSIS — I272 Pulmonary hypertension, unspecified: Secondary | ICD-10-CM | POA: Diagnosis not present

## 2019-12-31 DIAGNOSIS — R079 Chest pain, unspecified: Secondary | ICD-10-CM | POA: Diagnosis not present

## 2020-01-01 DIAGNOSIS — I4821 Permanent atrial fibrillation: Secondary | ICD-10-CM | POA: Diagnosis not present

## 2020-01-01 DIAGNOSIS — I509 Heart failure, unspecified: Secondary | ICD-10-CM | POA: Diagnosis not present

## 2020-01-01 DIAGNOSIS — R079 Chest pain, unspecified: Secondary | ICD-10-CM | POA: Diagnosis not present

## 2020-01-01 DIAGNOSIS — I4891 Unspecified atrial fibrillation: Secondary | ICD-10-CM | POA: Diagnosis not present

## 2020-01-02 ENCOUNTER — Other Ambulatory Visit: Payer: Self-pay

## 2020-01-02 MED ORDER — CLOPIDOGREL BISULFATE 75 MG PO TABS
75.00 | ORAL_TABLET | ORAL | Status: DC
Start: 2020-01-02 — End: 2020-01-02

## 2020-01-02 MED ORDER — PANTOPRAZOLE SODIUM 40 MG PO TBEC
40.00 | DELAYED_RELEASE_TABLET | ORAL | Status: DC
Start: 2020-01-02 — End: 2020-01-02

## 2020-01-02 MED ORDER — FLUTICASONE FUROATE-VILANTEROL 200-25 MCG/INH IN AEPB
1.00 | INHALATION_SPRAY | RESPIRATORY_TRACT | Status: DC
Start: 2020-01-02 — End: 2020-01-02

## 2020-01-02 MED ORDER — DILTIAZEM HCL ER BEADS 120 MG PO CP24
120.00 | ORAL_CAPSULE | ORAL | Status: DC
Start: 2020-01-02 — End: 2020-01-02

## 2020-01-02 MED ORDER — FERROUS SULFATE 325 (65 FE) MG PO TABS
325.00 | ORAL_TABLET | ORAL | Status: DC
Start: 2020-01-02 — End: 2020-01-02

## 2020-01-02 MED ORDER — HYDRALAZINE HCL 25 MG PO TABS
25.00 | ORAL_TABLET | ORAL | Status: DC
Start: 2020-01-01 — End: 2020-01-02

## 2020-01-02 MED ORDER — INSULIN GLARGINE 100 UNIT/ML ~~LOC~~ SOLN
30.00 | SUBCUTANEOUS | Status: DC
Start: 2020-01-01 — End: 2020-01-02

## 2020-01-02 MED ORDER — LEVEMIR FLEXTOUCH 100 UNIT/ML ~~LOC~~ SOPN: 65.0000 [IU] | PEN_INJECTOR | Freq: Every day | SUBCUTANEOUS | 2 refills | Status: DC

## 2020-01-02 MED ORDER — ISOSORBIDE MONONITRATE ER 60 MG PO TB24
120.00 | ORAL_TABLET | ORAL | Status: DC
Start: 2020-01-02 — End: 2020-01-02

## 2020-01-02 MED ORDER — DSS 100 MG PO CAPS
100.00 | ORAL_CAPSULE | ORAL | Status: DC
Start: 2020-01-01 — End: 2020-01-02

## 2020-01-02 MED ORDER — PREGABALIN 75 MG PO CAPS
75.00 | ORAL_CAPSULE | ORAL | Status: DC
Start: 2020-01-01 — End: 2020-01-02

## 2020-01-02 MED ORDER — TORSEMIDE 20 MG PO TABS: 60.0000 mg | ORAL_TABLET | Freq: Two times a day (BID) | ORAL | 0 refills | Status: DC

## 2020-01-02 MED ORDER — RIVAROXABAN 15 MG PO TABS
15.00 | ORAL_TABLET | ORAL | Status: DC
Start: 2020-01-02 — End: 2020-01-02

## 2020-01-02 MED ORDER — ATORVASTATIN CALCIUM 40 MG PO TABS
40.00 | ORAL_TABLET | ORAL | Status: DC
Start: 2020-01-02 — End: 2020-01-02

## 2020-01-02 MED ORDER — METOPROLOL SUCCINATE ER 50 MG PO TB24
50.00 | ORAL_TABLET | ORAL | Status: DC
Start: 2020-01-02 — End: 2020-01-02

## 2020-01-02 MED ORDER — FLUTICASONE PROPIONATE 50 MCG/ACT NA SUSP
1.00 | NASAL | Status: DC
Start: 2020-01-02 — End: 2020-01-02

## 2020-01-03 DIAGNOSIS — G2581 Restless legs syndrome: Secondary | ICD-10-CM | POA: Diagnosis not present

## 2020-01-03 DIAGNOSIS — I5032 Chronic diastolic (congestive) heart failure: Secondary | ICD-10-CM | POA: Diagnosis not present

## 2020-01-03 DIAGNOSIS — J9620 Acute and chronic respiratory failure, unspecified whether with hypoxia or hypercapnia: Secondary | ICD-10-CM | POA: Diagnosis not present

## 2020-01-03 DIAGNOSIS — I110 Hypertensive heart disease with heart failure: Secondary | ICD-10-CM | POA: Diagnosis not present

## 2020-01-03 DIAGNOSIS — E1151 Type 2 diabetes mellitus with diabetic peripheral angiopathy without gangrene: Secondary | ICD-10-CM | POA: Diagnosis not present

## 2020-01-03 DIAGNOSIS — E785 Hyperlipidemia, unspecified: Secondary | ICD-10-CM | POA: Diagnosis not present

## 2020-01-03 DIAGNOSIS — I252 Old myocardial infarction: Secondary | ICD-10-CM | POA: Diagnosis not present

## 2020-01-03 DIAGNOSIS — I25119 Atherosclerotic heart disease of native coronary artery with unspecified angina pectoris: Secondary | ICD-10-CM | POA: Diagnosis not present

## 2020-01-03 DIAGNOSIS — Z9181 History of falling: Secondary | ICD-10-CM | POA: Diagnosis not present

## 2020-01-03 DIAGNOSIS — B9562 Methicillin resistant Staphylococcus aureus infection as the cause of diseases classified elsewhere: Secondary | ICD-10-CM | POA: Diagnosis not present

## 2020-01-03 DIAGNOSIS — I482 Chronic atrial fibrillation, unspecified: Secondary | ICD-10-CM | POA: Diagnosis not present

## 2020-01-03 DIAGNOSIS — Z8673 Personal history of transient ischemic attack (TIA), and cerebral infarction without residual deficits: Secondary | ICD-10-CM | POA: Diagnosis not present

## 2020-01-03 DIAGNOSIS — I824Z2 Acute embolism and thrombosis of unspecified deep veins of left distal lower extremity: Secondary | ICD-10-CM | POA: Diagnosis not present

## 2020-01-03 DIAGNOSIS — I872 Venous insufficiency (chronic) (peripheral): Secondary | ICD-10-CM | POA: Diagnosis not present

## 2020-01-03 DIAGNOSIS — J449 Chronic obstructive pulmonary disease, unspecified: Secondary | ICD-10-CM | POA: Diagnosis not present

## 2020-01-03 DIAGNOSIS — K219 Gastro-esophageal reflux disease without esophagitis: Secondary | ICD-10-CM | POA: Diagnosis not present

## 2020-01-03 DIAGNOSIS — E1142 Type 2 diabetes mellitus with diabetic polyneuropathy: Secondary | ICD-10-CM | POA: Diagnosis not present

## 2020-01-03 DIAGNOSIS — G4733 Obstructive sleep apnea (adult) (pediatric): Secondary | ICD-10-CM | POA: Diagnosis not present

## 2020-01-03 DIAGNOSIS — M170 Bilateral primary osteoarthritis of knee: Secondary | ICD-10-CM | POA: Diagnosis not present

## 2020-01-03 DIAGNOSIS — Z9884 Bariatric surgery status: Secondary | ICD-10-CM | POA: Diagnosis not present

## 2020-01-03 DIAGNOSIS — L02415 Cutaneous abscess of right lower limb: Secondary | ICD-10-CM | POA: Diagnosis not present

## 2020-01-03 DIAGNOSIS — E039 Hypothyroidism, unspecified: Secondary | ICD-10-CM | POA: Diagnosis not present

## 2020-01-03 DIAGNOSIS — M797 Fibromyalgia: Secondary | ICD-10-CM | POA: Diagnosis not present

## 2020-01-03 DIAGNOSIS — L97929 Non-pressure chronic ulcer of unspecified part of left lower leg with unspecified severity: Secondary | ICD-10-CM | POA: Diagnosis not present

## 2020-01-06 ENCOUNTER — Telehealth: Payer: Self-pay | Admitting: Family Medicine

## 2020-01-06 DIAGNOSIS — L97929 Non-pressure chronic ulcer of unspecified part of left lower leg with unspecified severity: Secondary | ICD-10-CM | POA: Diagnosis not present

## 2020-01-06 DIAGNOSIS — I252 Old myocardial infarction: Secondary | ICD-10-CM | POA: Diagnosis not present

## 2020-01-06 DIAGNOSIS — L02415 Cutaneous abscess of right lower limb: Secondary | ICD-10-CM | POA: Diagnosis not present

## 2020-01-06 DIAGNOSIS — B9562 Methicillin resistant Staphylococcus aureus infection as the cause of diseases classified elsewhere: Secondary | ICD-10-CM | POA: Diagnosis not present

## 2020-01-06 DIAGNOSIS — Z8673 Personal history of transient ischemic attack (TIA), and cerebral infarction without residual deficits: Secondary | ICD-10-CM | POA: Diagnosis not present

## 2020-01-06 DIAGNOSIS — G2581 Restless legs syndrome: Secondary | ICD-10-CM | POA: Diagnosis not present

## 2020-01-06 DIAGNOSIS — Z9884 Bariatric surgery status: Secondary | ICD-10-CM | POA: Diagnosis not present

## 2020-01-06 DIAGNOSIS — E785 Hyperlipidemia, unspecified: Secondary | ICD-10-CM | POA: Diagnosis not present

## 2020-01-06 DIAGNOSIS — I25119 Atherosclerotic heart disease of native coronary artery with unspecified angina pectoris: Secondary | ICD-10-CM | POA: Diagnosis not present

## 2020-01-06 DIAGNOSIS — I110 Hypertensive heart disease with heart failure: Secondary | ICD-10-CM | POA: Diagnosis not present

## 2020-01-06 DIAGNOSIS — J449 Chronic obstructive pulmonary disease, unspecified: Secondary | ICD-10-CM | POA: Diagnosis not present

## 2020-01-06 DIAGNOSIS — M797 Fibromyalgia: Secondary | ICD-10-CM | POA: Diagnosis not present

## 2020-01-06 DIAGNOSIS — I824Z2 Acute embolism and thrombosis of unspecified deep veins of left distal lower extremity: Secondary | ICD-10-CM | POA: Diagnosis not present

## 2020-01-06 DIAGNOSIS — G4733 Obstructive sleep apnea (adult) (pediatric): Secondary | ICD-10-CM | POA: Diagnosis not present

## 2020-01-06 DIAGNOSIS — Z9181 History of falling: Secondary | ICD-10-CM | POA: Diagnosis not present

## 2020-01-06 DIAGNOSIS — J9620 Acute and chronic respiratory failure, unspecified whether with hypoxia or hypercapnia: Secondary | ICD-10-CM | POA: Diagnosis not present

## 2020-01-06 DIAGNOSIS — I872 Venous insufficiency (chronic) (peripheral): Secondary | ICD-10-CM | POA: Diagnosis not present

## 2020-01-06 DIAGNOSIS — I482 Chronic atrial fibrillation, unspecified: Secondary | ICD-10-CM | POA: Diagnosis not present

## 2020-01-06 DIAGNOSIS — K219 Gastro-esophageal reflux disease without esophagitis: Secondary | ICD-10-CM | POA: Diagnosis not present

## 2020-01-06 DIAGNOSIS — E1142 Type 2 diabetes mellitus with diabetic polyneuropathy: Secondary | ICD-10-CM | POA: Diagnosis not present

## 2020-01-06 DIAGNOSIS — M170 Bilateral primary osteoarthritis of knee: Secondary | ICD-10-CM | POA: Diagnosis not present

## 2020-01-06 DIAGNOSIS — E039 Hypothyroidism, unspecified: Secondary | ICD-10-CM | POA: Diagnosis not present

## 2020-01-06 DIAGNOSIS — E1151 Type 2 diabetes mellitus with diabetic peripheral angiopathy without gangrene: Secondary | ICD-10-CM | POA: Diagnosis not present

## 2020-01-06 DIAGNOSIS — I5032 Chronic diastolic (congestive) heart failure: Secondary | ICD-10-CM | POA: Diagnosis not present

## 2020-01-06 NOTE — Progress Notes (Signed)
°  Chronic Care Management   Outreach Note  01/06/2020 Name: KRISHANG READING MRN: [id number] DOB: 1951-07-19  Referred by: Rochel Brome, MD Reason for referral : No chief complaint on file.   An unsuccessful telephone outreach was attempted today. The patient was referred to the pharmacist for assistance with care management and care coordination.   This note is not being shared with the patient for the following reason: To respect privacy (The patient or proxy has requested that the information not be shared). Follow Up Plan:   Earney Hamburg Upstream Scheduler

## 2020-01-09 NOTE — Progress Notes (Signed)
Cancelled. kc 

## 2020-01-10 ENCOUNTER — Ambulatory Visit (INDEPENDENT_AMBULATORY_CARE_PROVIDER_SITE_OTHER): Payer: Medicare Other | Admitting: Family Medicine

## 2020-01-10 DIAGNOSIS — G2581 Restless legs syndrome: Secondary | ICD-10-CM | POA: Diagnosis not present

## 2020-01-10 DIAGNOSIS — K219 Gastro-esophageal reflux disease without esophagitis: Secondary | ICD-10-CM | POA: Diagnosis not present

## 2020-01-10 DIAGNOSIS — I872 Venous insufficiency (chronic) (peripheral): Secondary | ICD-10-CM | POA: Diagnosis not present

## 2020-01-10 DIAGNOSIS — I252 Old myocardial infarction: Secondary | ICD-10-CM | POA: Diagnosis not present

## 2020-01-10 DIAGNOSIS — L97929 Non-pressure chronic ulcer of unspecified part of left lower leg with unspecified severity: Secondary | ICD-10-CM | POA: Diagnosis not present

## 2020-01-10 DIAGNOSIS — I824Z2 Acute embolism and thrombosis of unspecified deep veins of left distal lower extremity: Secondary | ICD-10-CM | POA: Diagnosis not present

## 2020-01-10 DIAGNOSIS — Z9181 History of falling: Secondary | ICD-10-CM | POA: Diagnosis not present

## 2020-01-10 DIAGNOSIS — J449 Chronic obstructive pulmonary disease, unspecified: Secondary | ICD-10-CM | POA: Diagnosis not present

## 2020-01-10 DIAGNOSIS — Z8673 Personal history of transient ischemic attack (TIA), and cerebral infarction without residual deficits: Secondary | ICD-10-CM | POA: Diagnosis not present

## 2020-01-10 DIAGNOSIS — M797 Fibromyalgia: Secondary | ICD-10-CM | POA: Diagnosis not present

## 2020-01-10 DIAGNOSIS — I25119 Atherosclerotic heart disease of native coronary artery with unspecified angina pectoris: Secondary | ICD-10-CM | POA: Diagnosis not present

## 2020-01-10 DIAGNOSIS — I482 Chronic atrial fibrillation, unspecified: Secondary | ICD-10-CM | POA: Diagnosis not present

## 2020-01-10 DIAGNOSIS — I5032 Chronic diastolic (congestive) heart failure: Secondary | ICD-10-CM | POA: Diagnosis not present

## 2020-01-10 DIAGNOSIS — J9620 Acute and chronic respiratory failure, unspecified whether with hypoxia or hypercapnia: Secondary | ICD-10-CM | POA: Diagnosis not present

## 2020-01-10 DIAGNOSIS — B9562 Methicillin resistant Staphylococcus aureus infection as the cause of diseases classified elsewhere: Secondary | ICD-10-CM | POA: Diagnosis not present

## 2020-01-10 DIAGNOSIS — E039 Hypothyroidism, unspecified: Secondary | ICD-10-CM | POA: Diagnosis not present

## 2020-01-10 DIAGNOSIS — Z9884 Bariatric surgery status: Secondary | ICD-10-CM | POA: Diagnosis not present

## 2020-01-10 DIAGNOSIS — M170 Bilateral primary osteoarthritis of knee: Secondary | ICD-10-CM | POA: Diagnosis not present

## 2020-01-10 DIAGNOSIS — I110 Hypertensive heart disease with heart failure: Secondary | ICD-10-CM | POA: Diagnosis not present

## 2020-01-10 DIAGNOSIS — G4733 Obstructive sleep apnea (adult) (pediatric): Secondary | ICD-10-CM | POA: Diagnosis not present

## 2020-01-10 DIAGNOSIS — E1142 Type 2 diabetes mellitus with diabetic polyneuropathy: Secondary | ICD-10-CM | POA: Diagnosis not present

## 2020-01-10 DIAGNOSIS — E1151 Type 2 diabetes mellitus with diabetic peripheral angiopathy without gangrene: Secondary | ICD-10-CM | POA: Diagnosis not present

## 2020-01-10 DIAGNOSIS — E785 Hyperlipidemia, unspecified: Secondary | ICD-10-CM | POA: Diagnosis not present

## 2020-01-10 DIAGNOSIS — L02415 Cutaneous abscess of right lower limb: Secondary | ICD-10-CM | POA: Diagnosis not present

## 2020-01-11 ENCOUNTER — Other Ambulatory Visit: Payer: Self-pay | Admitting: Family Medicine

## 2020-01-13 DIAGNOSIS — I872 Venous insufficiency (chronic) (peripheral): Secondary | ICD-10-CM | POA: Diagnosis not present

## 2020-01-13 DIAGNOSIS — E039 Hypothyroidism, unspecified: Secondary | ICD-10-CM | POA: Diagnosis not present

## 2020-01-13 DIAGNOSIS — L02415 Cutaneous abscess of right lower limb: Secondary | ICD-10-CM | POA: Diagnosis not present

## 2020-01-13 DIAGNOSIS — E1142 Type 2 diabetes mellitus with diabetic polyneuropathy: Secondary | ICD-10-CM | POA: Diagnosis not present

## 2020-01-13 DIAGNOSIS — I824Z2 Acute embolism and thrombosis of unspecified deep veins of left distal lower extremity: Secondary | ICD-10-CM | POA: Diagnosis not present

## 2020-01-13 DIAGNOSIS — M797 Fibromyalgia: Secondary | ICD-10-CM | POA: Diagnosis not present

## 2020-01-13 DIAGNOSIS — I110 Hypertensive heart disease with heart failure: Secondary | ICD-10-CM | POA: Diagnosis not present

## 2020-01-13 DIAGNOSIS — I5032 Chronic diastolic (congestive) heart failure: Secondary | ICD-10-CM | POA: Diagnosis not present

## 2020-01-13 DIAGNOSIS — G2581 Restless legs syndrome: Secondary | ICD-10-CM | POA: Diagnosis not present

## 2020-01-13 DIAGNOSIS — K219 Gastro-esophageal reflux disease without esophagitis: Secondary | ICD-10-CM | POA: Diagnosis not present

## 2020-01-13 DIAGNOSIS — Z9181 History of falling: Secondary | ICD-10-CM | POA: Diagnosis not present

## 2020-01-13 DIAGNOSIS — M170 Bilateral primary osteoarthritis of knee: Secondary | ICD-10-CM | POA: Diagnosis not present

## 2020-01-13 DIAGNOSIS — J9620 Acute and chronic respiratory failure, unspecified whether with hypoxia or hypercapnia: Secondary | ICD-10-CM | POA: Diagnosis not present

## 2020-01-13 DIAGNOSIS — G4733 Obstructive sleep apnea (adult) (pediatric): Secondary | ICD-10-CM | POA: Diagnosis not present

## 2020-01-13 DIAGNOSIS — B9562 Methicillin resistant Staphylococcus aureus infection as the cause of diseases classified elsewhere: Secondary | ICD-10-CM | POA: Diagnosis not present

## 2020-01-13 DIAGNOSIS — I482 Chronic atrial fibrillation, unspecified: Secondary | ICD-10-CM | POA: Diagnosis not present

## 2020-01-13 DIAGNOSIS — E1151 Type 2 diabetes mellitus with diabetic peripheral angiopathy without gangrene: Secondary | ICD-10-CM | POA: Diagnosis not present

## 2020-01-13 DIAGNOSIS — L97929 Non-pressure chronic ulcer of unspecified part of left lower leg with unspecified severity: Secondary | ICD-10-CM | POA: Diagnosis not present

## 2020-01-13 DIAGNOSIS — J449 Chronic obstructive pulmonary disease, unspecified: Secondary | ICD-10-CM | POA: Diagnosis not present

## 2020-01-13 DIAGNOSIS — Z8673 Personal history of transient ischemic attack (TIA), and cerebral infarction without residual deficits: Secondary | ICD-10-CM | POA: Diagnosis not present

## 2020-01-13 DIAGNOSIS — E785 Hyperlipidemia, unspecified: Secondary | ICD-10-CM | POA: Diagnosis not present

## 2020-01-13 DIAGNOSIS — I25119 Atherosclerotic heart disease of native coronary artery with unspecified angina pectoris: Secondary | ICD-10-CM | POA: Diagnosis not present

## 2020-01-13 DIAGNOSIS — I252 Old myocardial infarction: Secondary | ICD-10-CM | POA: Diagnosis not present

## 2020-01-13 DIAGNOSIS — Z9884 Bariatric surgery status: Secondary | ICD-10-CM | POA: Diagnosis not present

## 2020-01-13 NOTE — Progress Notes (Signed)
Subjective:  Patient ID: GEOFREY SILLIMAN, male    DOB: 07-Nov-1950  Age: 69 y.o. MRN: [id number]  Chief Complaint  Patient presents with  . Hospitalization Follow-up    HPI   Patient is a 69 year old white male with significant history of CHF, A. fib, sleep apnea, diabetes with neuropathy,, and obesity who presented to the emergency department with chest pain.  He was treated as an acute coronary syndrome.  He developed acute on chronic renal failure during his admission.  Work-up for the chest pain included troponins which although elevated were noted to be in a flat pattern likely due to the acute kidney injury or his chronic kidney disease.  Echocardiogram was normal other than being in atrial fibrillation (controlled).  His ACE inhibitor and diuretic were held because of the acute kidney failure and he was started on hydralazine.  Myocardial infarction was ruled out and his chest pain resolved.  Patient did have a cardiac catheterization in 2016 which was normal.  Patient was started on hydralazine 25 mg 2 tablets 3 times daily.  He was instructed to stop the NovoLog injections, cefdinir, hydrochlorothiazide, Humulin, prednisone, Topamax, and Valtrex.  Patient was discharged with home health care.  Home health care nurses to wrap his left leg which has a chronic left leg ulcer 3 times a week.  His creatinine bumped from 1.3-1.8 during his admission upon discharge it was 1.7. Patient instructed to follow-up with cardiology, nephrology, and myself.  Diabetes is poorly controlled. Sugars are running 217 to 382, however he then had a 66 this morning.  Bp is too high as well. His initial bp wasa 200/100. On recheck came down to 170/80.  Current Outpatient Medications on File Prior to Visit  Medication Sig Dispense Refill  . albuterol (PROVENTIL) (2.5 MG/3ML) 0.083% nebulizer solution Take 3 mLs (2.5 mg total) by nebulization 2 (two) times daily. And as needed (Patient taking differently: Take 2.5 mg  by nebulization in the morning, at noon, in the evening, and at bedtime. And as needed) 75 mL 5  . atorvastatin (LIPITOR) 40 MG tablet Take 40 mg by mouth daily.    . Azelastine HCl 0.15 % SOLN Place 2 sprays into both nostrils daily.     . BD PEN NEEDLE NANO U/F 32G X 4 MM MISC     . clopidogrel (PLAVIX) 75 MG tablet Take 1 tablet (75 mg total) by mouth daily. 30 tablet 5  . docusate sodium (COLACE) 100 MG capsule Take 100 mg by mouth 2 (two) times daily.    . DULERA 200-5 MCG/ACT AERO Inhale 1 puff into the lungs in the morning and at bedtime.     . famotidine (PEPCID) 20 MG tablet TAKE 2 TABLETS BY MOUTH TWICE DAILY 120 tablet 5  . ferrous sulfate 325 (65 FE) MG tablet Take 325 mg by mouth daily.    . Fluticasone Furoate-Vilanterol (BREO ELLIPTA IN) Inhale 200 mcg into the lungs daily.    . Glucagon, rDNA, (GLUCAGON EMERGENCY) 1 MG KIT     . insulin detemir (LEVEMIR FLEXTOUCH) 100 UNIT/ML FlexPen Inject 65 Units into the skin daily. (Patient taking differently: Inject 60 Units into the skin daily. ) 30 mL 2  . isosorbide mononitrate (IMDUR) 120 MG 24 hr tablet Take 120 mg by mouth daily. (Patient not taking: Reported on 02/09/2020)    . metolazone (ZAROXOLYN) 2.5 MG tablet Take 1 tablet (2.5 mg total) by mouth 2 (two) times a week. (Patient taking differently: Take 2.5  mg by mouth daily. ) 30 tablet 3  . metoprolol succinate (TOPROL-XL) 50 MG 24 hr tablet TAKE 1 TABLET BY MOUTH ONCE DAILY 90 tablet 0  . MOVANTIK 25 MG TABS tablet SMARTSIG:1 Tablet(s) By Mouth Daily    . nitroGLYCERIN (NITROSTAT) 0.4 MG SL tablet Place 0.4 mg under the tongue every 5 (five) minutes as needed for chest pain.     . OXYGEN Inhale into the lungs. 2 liters at bedtime    . pantoprazole (PROTONIX) 40 MG tablet Take 40 mg by mouth in the morning.     . Potassium Chloride ER 20 MEQ TBCR Take 40 mEq by mouth 2 (two) times daily.     . pregabalin (LYRICA) 75 MG capsule Take 1 capsule (75 mg total) by mouth 2 (two) times  daily. 60 capsule 0  . RELISTOR 150 MG TABS TAKE 3 TABLETS BY MOUTH EVERY MORNING 90 tablet 0  . torsemide (DEMADEX) 20 MG tablet Take 3 tablets (60 mg total) by mouth 2 (two) times daily. 180 tablet 0  . Vitamin D, Ergocalciferol, (DRISDOL) 1.25 MG (50000 UNIT) CAPS capsule TAKE 1 CAPSULE BY MOUTH DAILY 90 capsule 1  . XARELTO 15 MG TABS tablet TAKE 1 TABLET BY MOUTH ONCE DAILY WITH EVENING MEAL (Patient taking differently: Take 15 mg by mouth daily after supper. ) 90 tablet 2   No current facility-administered medications on file prior to visit.   Past Medical History:  Diagnosis Date  . Abnormal EKG 11/29/2014  . Acute renal insufficiency 11/29/2014  . Angina   . Arthritis    "knees" (01/18/2014)  . Asthma   . Atrial fibrillation ([place])   . Back pain 01/25/2015  . 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 BOOP (bronchiolitis obliterans with organizing pneumonia) ([place]) 01/20/2014   OLBx 01/20/14 BOOP Started steroids 02/28/14   . Chronic anticoagulation-Xarelto 02/09/2014  . Chronic atrial fibrillation ([place]) 01/03/2014  . Chronic bronchitis ([place]) 12/21/2013  . Chronic diastolic heart failure ([place]) 02/22/2015  . Chronic pain of both knees 03/18/2017   Overview:  Added automatically from request for surgery [id number]  . Chronic respiratory failure ([place]) 04/10/2015  . COPD (chronic obstructive pulmonary disease) ([place]) 01/25/2015  . Cough    coughing up blood- started last nite, seems to be worse now  . CVA (cerebral vascular accident) ([place]) 02/09/2014  . Dysrhythmia    atrial fib takes cardizem,   . Fibromyalgia   . GERD (gastroesophageal reflux disease)   . H/O hiatal hernia   . Hemoptysis 08/01/2015  . Herpes zoster 06/26/2015  . History of stroke   . Hyperkalemia-repeat pending 11/29/2014  . Hyperlipemia   . Hyperlipidemia   . Hypertension   . Hypothyroidism   . IDDM (insulin dependent diabetes mellitus)    insulin pump   . Long term (current) use of anticoagulants 03/21/2014  . Mild CAD  02/20/2015   Overview:  On recent cardiac cath  Overview:  On recent cardiac cath  . Myocardial infarction [facility])    "one dr says yes; another says no"  . Neuromuscular disorder (HCC)    rls, neuropathy  . Neuropathy    rls, neuropathy   . Normal coronary arteries 2011 11/29/2014  . Obesity (BMI 30-39.9)   . On home oxygen therapy    "2L w/CPAP at bedtime" (09/01/2018)  . OSA on CPAP    cpap & oxygen  . Pericardial effusion   . Peritoneal effusion, chronic 02/22/2015   Overview:  With surgical window  .  PNA (pneumonia) 04/10/2015  . Pneumonia 5/15   "several times" (01/18/2014)  . Precordial pain 01/26/2015  . Preoperative cardiovascular examination 02/24/2016  . Primary osteoarthritis of both knees 03/18/2017   Overview:  Added automatically from request for surgery [id number]  . Restless leg syndrome   . Sleep apnea    cpap & oxygen   . Stroke [facility]) 2012   denies residual on 01/18/2014  . Troponin level elevated 11/29/2014  . Uncontrolled type 2 diabetes mellitus with diabetic polyneuropathy, with long-term current use of insulin ([place]) 12/21/2013   Past Surgical History:  Procedure Laterality Date  . ABDOMINAL AORTOGRAM W/LOWER EXTREMITY N/A 09/08/2018   Procedure: ABDOMINAL AORTOGRAM W/LOWER EXTREMITY;  Surgeon: Marty Heck, MD;  Location: [place] [facility];  Service: Cardiovascular;  Laterality: N/A;  . AMPUTATION Right 09/09/2018   Procedure: AMPUTATION RIGHT GREAT TOE;  Surgeon: Waynetta Sandy, MD;  Location: [place];  Service: Vascular;  Laterality: Right;  . APPENDECTOMY    . CARDIAC CATHETERIZATION  X 2 then 03/03/2012    NL LVF, normal coronaries, vessels are small ([facility]: Dr. Beatrix Fetters)  . CATARACT EXTRACTION W/ INTRAOCULAR LENS  IMPLANT, BILATERAL Bilateral   . CHOLECYSTECTOMY    . HERNIA REPAIR     UHR  . LAPAROSCOPIC GASTRIC BANDING  2010  . LOWER EXTREMITY ANGIOGRAPHY Left 10/11/2018   Procedure: LOWER EXTREMITY ANGIOGRAPHY;  Surgeon: Waynetta Sandy, MD;   Location: [place] [facility];  Service: Cardiovascular;  Laterality: Left;  . PERICARDIAL WINDOW Left 01/24/2014   Procedure: PERICARDIAL WINDOW;  Surgeon: Gaye Pollack, MD;  Location: [place];  Service: Thoracic;  Laterality: Left;  . PERIPHERAL VASCULAR INTERVENTION Right 09/08/2018   Procedure: PERIPHERAL VASCULAR INTERVENTION;  Surgeon: Marty Heck, MD;  Location: [place] [facility];  Service: Cardiovascular;  Laterality: Right;  . SINUS EXPLORATION  X 2  . TEE WITHOUT CARDIOVERSION N/A 09/07/2018   Procedure: TRANSESOPHAGEAL ECHOCARDIOGRAM (TEE);  Surgeon: Acie Fredrickson Wonda Cheng, MD;  Location: [place];  Service: Cardiovascular;  Laterality: N/A;  . UMBILICAL HERNIA REPAIR    . VIDEO ASSISTED THORACOSCOPY Left 01/24/2014   Procedure: VIDEO ASSISTED THORACOSCOPY;  Surgeon: Gaye Pollack, MD;  Location: [facility] OR;  Service: Thoracic;  Laterality: Left;  VATS/open lung biopsy  . VIDEO BRONCHOSCOPY Bilateral 12/29/2013   Procedure: VIDEO BRONCHOSCOPY WITHOUT FLUORO;  Surgeon: Elsie Stain, MD;  Location: [facility];  Service: Endoscopy;  Laterality: Bilateral;  . VIDEO BRONCHOSCOPY N/A 01/24/2014   Procedure: VIDEO BRONCHOSCOPY;  Surgeon: Gaye Pollack, MD;  Location[facility] OR;  Service: Thoracic;  Laterality: N/A;    Family History  Problem Relation Age of Onset  . Cancer Mother   . Stroke Father   . Heart disease Father   . Seizures Sister   . Diabetes Sister   . Anesthesia problems Neg Hx   . Hypotension Neg Hx   . Malignant hyperthermia Neg Hx   . Pseudochol deficiency Neg Hx    Social History   Socioeconomic History  . Marital status: Widowed    Spouse name: Not on file  . Number of children: Not on file  . Years of education: Not on file  . Highest education level: Not on file  Occupational History  . Not on file  Tobacco Use  . Smoking status: Former Smoker    Types: Cigars    Quit date: 09/01/2006    Years since quitting: 13.4  . Smokeless tobacco: Never Used    Vaping  Use  . Vaping Use: Never used  Substance and Sexual Activity  . 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 Drug use: No  . Sexual activity: Never  Other Topics Concern  . Not on file  Social History Narrative  . Not on file   Social Determinants of Health   Financial Resource Strain:   . Difficulty of Paying Living Expenses:   Food Insecurity:   . Worried About Charity fundraiser in the Last Year:   . Arboriculturist in the Last Year:   Transportation Needs: No Transportation Needs  . Lack of Transportation (Medical): No  . Lack of Transportation (Non-Medical): No  Physical Activity: Inactive  . Days of Exercise per Week: 0 days  . Minutes of Exercise per Session: 0 min  Stress:   . Feeling of Stress :   Social Connections:   . Frequency of Communication with Friends and Family:   . Frequency of Social Gatherings with Friends and Family:   . Attends Religious Services:   . Active Member of Clubs or Organizations:   . Attends Archivist Meetings:   Marland Kitchen Marital Status:     Review of Systems  Constitutional: Negative for chills, diaphoresis, fatigue and fever.  HENT: Negative for congestion, ear pain and sore throat.   Respiratory: Positive for shortness of breath and wheezing. Negative for cough.   Cardiovascular: Positive for leg swelling. Negative for chest pain.  Gastrointestinal: Positive for abdominal distention and constipation. Negative for abdominal pain, diarrhea, nausea and vomiting.  Genitourinary: Positive for decreased urine volume. Negative for dysuria and urgency.  Musculoskeletal: Positive for arthralgias and myalgias. Negative for back pain.  Neurological: Negative for dizziness and headaches.  Hematological: Bruises/bleeds easily.  Psychiatric/Behavioral: Negative for decreased concentration. The patient is not nervous/anxious.      Objective:  BP (!) 170/80   Pulse (!) 118   Temp 98.6 F (37 C)    Resp 20   Ht _0  (1.702 m)   Wt 270 lb (122.5 kg)   BMI 42.29 kg/m   BP/Weight 01/17/2020 12/12/2019 0/45/9977  Systolic BP 414 239 532  Diastolic BP 80 68 84  Wt. (Lbs) 270 262.2 263  BMI 42.29 41.07 41.19    Physical Exam Vitals reviewed.  Constitutional:      Appearance: Normal appearance. He is normal weight.  Neck:     Vascular: No carotid bruit.  Cardiovascular:     Rate and Rhythm: Normal rate. Rhythm irregular.  Pulmonary:     Effort: Pulmonary effort is normal.     Breath sounds: Normal breath sounds.  Abdominal:     General: Abdomen is flat. Bowel sounds are normal.     Palpations: Abdomen is soft.  Skin:    Comments: Legs were just wrapped by home healthcare. Per patient they are improving.   Neurological:     Mental Status: He is alert and oriented to person, place, and time.  Psychiatric:        Mood and Affect: Mood normal.        Behavior: Behavior normal.     Diabetic Foot Exam - Simple   Deferred til next visit.     Lab Results  Component Value Date   WBC 7.5 12/13/2019   HGB 14.3 12/13/2019   HCT 43.0 12/13/2019   PLT 241 12/13/2019   GLUCOSE 257 (H) 01/17/2020   CHOL 117 12/13/2019  TRIG 124 12/13/2019   HDL 40 12/13/2019   LDLCALC 55 12/13/2019   ALT 8 01/17/2020   AST 12 01/17/2020   NA 141 01/17/2020   K 3.6 01/17/2020   CL 99 01/17/2020   CREATININE 2.07 (H) 01/17/2020   BUN 18 01/17/2020   CO2 29 01/17/2020   TSH 5.170 (H) 12/13/2019   INR 1.68 09/01/2018   HGBA1C 8.6 (H) 12/13/2019      Assessment & Plan:   1. Chronic atrial fibrillation (HCC) Stable. 2. Hypertensive heart disease with heart failure ([place]) Instructed to weigh daily. Increase hydralazine to 25 mg 3 pills three times a day. Continue imdur 120 mg once daily, Cardizem 120 mg once daily,  metoprolol ER 50 mg once daily, Torsemide 20 mg 3 twice a day. - Comprehensive metabolic panel  3. Acute renal insufficiency Resolved, back to baseline. Needs follow  up with nephrology.  4. Type 2 diabetes mellitus with stage 3b chronic kidney disease, with long-term current use of insulin (HCC) Control: not at goal. Set up to see dietician/diabetes specialist, Janelle. Recommend check sugars before meals and before  Recommend check feet daily. Recommend annual eye exams. Medicines: Continue Levemir at 60 U at night,, Restart Novoog at 10 U before supper.  Continue to work on eating a healthy diet and exercise.  Labs drawn today.    5. OSA on CPAP Continue to wear cpap  6. Chronic respiratory failure with hypoxia (HCC) Wears O2 at 2L  7. Morbid obesity with BMI of 40.0-44.9, adult ([place]) Recommend continue to work on eating healthy diet and exercise.  8. Moderate recurrent major depression ([place])  No changes at this time. Much of this is situational and I gave him some behavioral techniques.  9. Acute ulcers on legs.  Continue wraps and wound care by home health care.  Orders Placed This Encounter  Procedures  . Comprehensive metabolic panel     Follow-up: Return in about 4 weeks (around 02/14/2020) for Hypertension and Diabetes.  An After Visit Summary was printed and given to the patient.  Rochel Brome [facility] [phone]

## 2020-01-16 ENCOUNTER — Telehealth: Payer: Self-pay | Admitting: Family Medicine

## 2020-01-16 NOTE — Progress Notes (Signed)
  Chronic Care Management   Note  01/16/2020 Name: Jon [name] MRN: [id number] DOB: 1951-02-24  Jon [name] is a 69 y.o. year old male who is a primary care patient of Cox, Kirsten, MD. I reached out to [name] by phone today in response to a referral sent by Jon [name] PCP, Cox, Kirsten, MD.   Jon [name] was given information about Chronic Care Management services today including:  1. CCM service includes personalized support from designated clinical staff supervised by his physician, including individualized plan of care and coordination with other care providers 2. 24/7 contact phone numbers for assistance for urgent and routine care needs. 3. Service will only be billed when office clinical staff spend 20 minutes or more in a month to coordinate care. 4. Only one practitioner may furnish and bill the service in a calendar month. 5. The patient may stop CCM services at any time (effective at the end of the month) by phone call to the office staff.   Patient agreed to services and verbal consent obtained.   This note is not being shared with the patient for the following reason: To respect privacy (The patient or proxy has requested that the information not be shared).  Follow up plan:   Earney [place] [employer] Scheduler

## 2020-01-17 ENCOUNTER — Other Ambulatory Visit: Payer: Self-pay

## 2020-01-17 ENCOUNTER — Ambulatory Visit (INDEPENDENT_AMBULATORY_CARE_PROVIDER_SITE_OTHER): Payer: Medicare Other | Admitting: Family Medicine

## 2020-01-17 ENCOUNTER — Encounter: Payer: Self-pay | Admitting: Family Medicine

## 2020-01-17 VITALS — BP 170/80 | HR 118 | Temp 98.6°F | Resp 20 | Ht 67.0 in | Wt 270.0 lb

## 2020-01-17 DIAGNOSIS — L97929 Non-pressure chronic ulcer of unspecified part of left lower leg with unspecified severity: Secondary | ICD-10-CM

## 2020-01-17 DIAGNOSIS — Z9884 Bariatric surgery status: Secondary | ICD-10-CM | POA: Diagnosis not present

## 2020-01-17 DIAGNOSIS — I110 Hypertensive heart disease with heart failure: Secondary | ICD-10-CM

## 2020-01-17 DIAGNOSIS — I824Z2 Acute embolism and thrombosis of unspecified deep veins of left distal lower extremity: Secondary | ICD-10-CM | POA: Diagnosis not present

## 2020-01-17 DIAGNOSIS — B9562 Methicillin resistant Staphylococcus aureus infection as the cause of diseases classified elsewhere: Secondary | ICD-10-CM | POA: Diagnosis not present

## 2020-01-17 DIAGNOSIS — K219 Gastro-esophageal reflux disease without esophagitis: Secondary | ICD-10-CM | POA: Diagnosis not present

## 2020-01-17 DIAGNOSIS — M797 Fibromyalgia: Secondary | ICD-10-CM | POA: Diagnosis not present

## 2020-01-17 DIAGNOSIS — E1151 Type 2 diabetes mellitus with diabetic peripheral angiopathy without gangrene: Secondary | ICD-10-CM | POA: Diagnosis not present

## 2020-01-17 DIAGNOSIS — L02415 Cutaneous abscess of right lower limb: Secondary | ICD-10-CM | POA: Diagnosis not present

## 2020-01-17 DIAGNOSIS — F331 Major depressive disorder, recurrent, moderate: Secondary | ICD-10-CM

## 2020-01-17 DIAGNOSIS — I252 Old myocardial infarction: Secondary | ICD-10-CM | POA: Diagnosis not present

## 2020-01-17 DIAGNOSIS — N289 Disorder of kidney and ureter, unspecified: Secondary | ICD-10-CM

## 2020-01-17 DIAGNOSIS — E785 Hyperlipidemia, unspecified: Secondary | ICD-10-CM | POA: Diagnosis not present

## 2020-01-17 DIAGNOSIS — M170 Bilateral primary osteoarthritis of knee: Secondary | ICD-10-CM | POA: Diagnosis not present

## 2020-01-17 DIAGNOSIS — Z6841 Body Mass Index (BMI) 40.0 and over, adult: Secondary | ICD-10-CM

## 2020-01-17 DIAGNOSIS — E1142 Type 2 diabetes mellitus with diabetic polyneuropathy: Secondary | ICD-10-CM | POA: Diagnosis not present

## 2020-01-17 DIAGNOSIS — G4733 Obstructive sleep apnea (adult) (pediatric): Secondary | ICD-10-CM | POA: Diagnosis not present

## 2020-01-17 DIAGNOSIS — J449 Chronic obstructive pulmonary disease, unspecified: Secondary | ICD-10-CM | POA: Diagnosis not present

## 2020-01-17 DIAGNOSIS — I482 Chronic atrial fibrillation, unspecified: Secondary | ICD-10-CM

## 2020-01-17 DIAGNOSIS — I25119 Atherosclerotic heart disease of native coronary artery with unspecified angina pectoris: Secondary | ICD-10-CM | POA: Diagnosis not present

## 2020-01-17 DIAGNOSIS — E039 Hypothyroidism, unspecified: Secondary | ICD-10-CM | POA: Diagnosis not present

## 2020-01-17 DIAGNOSIS — J9620 Acute and chronic respiratory failure, unspecified whether with hypoxia or hypercapnia: Secondary | ICD-10-CM | POA: Diagnosis not present

## 2020-01-17 DIAGNOSIS — Z9989 Dependence on other enabling machines and devices: Secondary | ICD-10-CM

## 2020-01-17 DIAGNOSIS — Z8673 Personal history of transient ischemic attack (TIA), and cerebral infarction without residual deficits: Secondary | ICD-10-CM | POA: Diagnosis not present

## 2020-01-17 DIAGNOSIS — Z794 Long term (current) use of insulin: Secondary | ICD-10-CM

## 2020-01-17 DIAGNOSIS — I872 Venous insufficiency (chronic) (peripheral): Secondary | ICD-10-CM | POA: Diagnosis not present

## 2020-01-17 DIAGNOSIS — Z9181 History of falling: Secondary | ICD-10-CM | POA: Diagnosis not present

## 2020-01-17 DIAGNOSIS — N1832 Chronic kidney disease, stage 3b: Secondary | ICD-10-CM

## 2020-01-17 DIAGNOSIS — E1122 Type 2 diabetes mellitus with diabetic chronic kidney disease: Secondary | ICD-10-CM

## 2020-01-17 DIAGNOSIS — J9611 Chronic respiratory failure with hypoxia: Secondary | ICD-10-CM

## 2020-01-17 DIAGNOSIS — G2581 Restless legs syndrome: Secondary | ICD-10-CM | POA: Diagnosis not present

## 2020-01-17 DIAGNOSIS — L97919 Non-pressure chronic ulcer of unspecified part of right lower leg with unspecified severity: Secondary | ICD-10-CM

## 2020-01-17 DIAGNOSIS — E1121 Type 2 diabetes mellitus with diabetic nephropathy: Secondary | ICD-10-CM | POA: Diagnosis not present

## 2020-01-17 DIAGNOSIS — I5032 Chronic diastolic (congestive) heart failure: Secondary | ICD-10-CM | POA: Diagnosis not present

## 2020-01-17 NOTE — Patient Instructions (Addendum)
Hypertension  - Increase hydralazine 25 mg 3 pills three times a day. Continue imdur 120 mg once daily. Continue Cardizem 120 mg once daily.  Continue metoprolol ER 50 mg once daily. Torsemide 20 mg 3 twice a day. Check bp daily.   Diabetes -  Continue levemir 60 U at night. Restart novolog 10 U before supper. Keep sugar log - check sugars before each meal.  Weigh self daily.

## 2020-01-18 ENCOUNTER — Other Ambulatory Visit: Payer: Self-pay | Admitting: Family Medicine

## 2020-01-18 LAB — COMPREHENSIVE METABOLIC PANEL
ALT: 8 IU/L (ref 0–44)
AST: 12 IU/L (ref 0–40)
Albumin/Globulin Ratio: 1.7 (ref 1.2–2.2)
Albumin: 3.9 g/dL (ref 3.7–4.7)
Alkaline Phosphatase: 121 IU/L (ref 48–121)
BUN/Creatinine Ratio: 9 — ABNORMAL LOW (ref 10–24)
BUN: 18 mg/dL (ref 8–27)
Bilirubin Total: 0.7 mg/dL (ref 0.0–1.2)
CO2: 29 mmol/L (ref 20–29)
Calcium: 8.9 mg/dL (ref 8.6–10.2)
Chloride: 99 mmol/L (ref 96–106)
Creatinine, Ser: 2.07 mg/dL — ABNORMAL HIGH (ref 0.76–1.27)
GFR calc Af Amer: 36 mL/min/{1.73_m2} — ABNORMAL LOW (ref >59)
GFR calc non Af Amer: 31 mL/min/{1.73_m2} — ABNORMAL LOW (ref >59)
Globulin, Total: 2.3 g/dL (ref 1.5–4.5)
Glucose: 257 mg/dL — ABNORMAL HIGH (ref 65–99)
Potassium: 3.6 mmol/L (ref 3.5–5.2)
Sodium: 141 mmol/L (ref 134–144)
Total Protein: 6.2 g/dL (ref 6.0–8.5)

## 2020-01-20 ENCOUNTER — Other Ambulatory Visit: Payer: Self-pay | Admitting: Family Medicine

## 2020-01-20 ENCOUNTER — Other Ambulatory Visit: Payer: Self-pay

## 2020-01-20 ENCOUNTER — Telehealth: Payer: Self-pay

## 2020-01-20 DIAGNOSIS — G2581 Restless legs syndrome: Secondary | ICD-10-CM | POA: Diagnosis not present

## 2020-01-20 DIAGNOSIS — E1142 Type 2 diabetes mellitus with diabetic polyneuropathy: Secondary | ICD-10-CM | POA: Diagnosis not present

## 2020-01-20 DIAGNOSIS — B9562 Methicillin resistant Staphylococcus aureus infection as the cause of diseases classified elsewhere: Secondary | ICD-10-CM | POA: Diagnosis not present

## 2020-01-20 DIAGNOSIS — I872 Venous insufficiency (chronic) (peripheral): Secondary | ICD-10-CM | POA: Diagnosis not present

## 2020-01-20 DIAGNOSIS — I110 Hypertensive heart disease with heart failure: Secondary | ICD-10-CM | POA: Diagnosis not present

## 2020-01-20 DIAGNOSIS — M797 Fibromyalgia: Secondary | ICD-10-CM | POA: Diagnosis not present

## 2020-01-20 DIAGNOSIS — L97929 Non-pressure chronic ulcer of unspecified part of left lower leg with unspecified severity: Secondary | ICD-10-CM | POA: Diagnosis not present

## 2020-01-20 DIAGNOSIS — E785 Hyperlipidemia, unspecified: Secondary | ICD-10-CM | POA: Diagnosis not present

## 2020-01-20 DIAGNOSIS — M170 Bilateral primary osteoarthritis of knee: Secondary | ICD-10-CM | POA: Diagnosis not present

## 2020-01-20 DIAGNOSIS — I824Z2 Acute embolism and thrombosis of unspecified deep veins of left distal lower extremity: Secondary | ICD-10-CM | POA: Diagnosis not present

## 2020-01-20 DIAGNOSIS — I25119 Atherosclerotic heart disease of native coronary artery with unspecified angina pectoris: Secondary | ICD-10-CM | POA: Diagnosis not present

## 2020-01-20 DIAGNOSIS — I5032 Chronic diastolic (congestive) heart failure: Secondary | ICD-10-CM | POA: Diagnosis not present

## 2020-01-20 DIAGNOSIS — L02415 Cutaneous abscess of right lower limb: Secondary | ICD-10-CM | POA: Diagnosis not present

## 2020-01-20 DIAGNOSIS — Z9884 Bariatric surgery status: Secondary | ICD-10-CM | POA: Diagnosis not present

## 2020-01-20 DIAGNOSIS — I482 Chronic atrial fibrillation, unspecified: Secondary | ICD-10-CM | POA: Diagnosis not present

## 2020-01-20 DIAGNOSIS — K219 Gastro-esophageal reflux disease without esophagitis: Secondary | ICD-10-CM | POA: Diagnosis not present

## 2020-01-20 DIAGNOSIS — E039 Hypothyroidism, unspecified: Secondary | ICD-10-CM | POA: Diagnosis not present

## 2020-01-20 DIAGNOSIS — J449 Chronic obstructive pulmonary disease, unspecified: Secondary | ICD-10-CM | POA: Diagnosis not present

## 2020-01-20 DIAGNOSIS — Z8673 Personal history of transient ischemic attack (TIA), and cerebral infarction without residual deficits: Secondary | ICD-10-CM | POA: Diagnosis not present

## 2020-01-20 DIAGNOSIS — J9620 Acute and chronic respiratory failure, unspecified whether with hypoxia or hypercapnia: Secondary | ICD-10-CM | POA: Diagnosis not present

## 2020-01-20 DIAGNOSIS — E1151 Type 2 diabetes mellitus with diabetic peripheral angiopathy without gangrene: Secondary | ICD-10-CM | POA: Diagnosis not present

## 2020-01-20 DIAGNOSIS — I252 Old myocardial infarction: Secondary | ICD-10-CM | POA: Diagnosis not present

## 2020-01-20 DIAGNOSIS — G4733 Obstructive sleep apnea (adult) (pediatric): Secondary | ICD-10-CM | POA: Diagnosis not present

## 2020-01-20 DIAGNOSIS — Z9181 History of falling: Secondary | ICD-10-CM | POA: Diagnosis not present

## 2020-01-20 MED ORDER — DILTIAZEM HCL ER COATED BEADS 120 MG PO CP24
120.0000 mg | ORAL_CAPSULE | Freq: Every day | ORAL | 0 refills | Status: DC
Start: 2020-01-20 — End: 2020-03-14

## 2020-01-20 NOTE — Telephone Encounter (Signed)
Jon [name] called for clarification on Jon [name]'s medication.  She put his medication in planners and he does not have cardizem 120 mg. Will send RX to the pharmacy.

## 2020-01-23 DIAGNOSIS — E1151 Type 2 diabetes mellitus with diabetic peripheral angiopathy without gangrene: Secondary | ICD-10-CM | POA: Diagnosis not present

## 2020-01-23 DIAGNOSIS — L97929 Non-pressure chronic ulcer of unspecified part of left lower leg with unspecified severity: Secondary | ICD-10-CM | POA: Diagnosis not present

## 2020-01-23 DIAGNOSIS — B9562 Methicillin resistant Staphylococcus aureus infection as the cause of diseases classified elsewhere: Secondary | ICD-10-CM | POA: Diagnosis not present

## 2020-01-23 DIAGNOSIS — J9610 Chronic respiratory failure, unspecified whether with hypoxia or hypercapnia: Secondary | ICD-10-CM | POA: Diagnosis not present

## 2020-01-24 ENCOUNTER — Ambulatory Visit: Payer: Medicare Other | Admitting: Podiatry

## 2020-01-24 DIAGNOSIS — I252 Old myocardial infarction: Secondary | ICD-10-CM | POA: Diagnosis not present

## 2020-01-24 DIAGNOSIS — E1151 Type 2 diabetes mellitus with diabetic peripheral angiopathy without gangrene: Secondary | ICD-10-CM | POA: Diagnosis not present

## 2020-01-24 DIAGNOSIS — Z9884 Bariatric surgery status: Secondary | ICD-10-CM | POA: Diagnosis not present

## 2020-01-24 DIAGNOSIS — B9562 Methicillin resistant Staphylococcus aureus infection as the cause of diseases classified elsewhere: Secondary | ICD-10-CM | POA: Diagnosis not present

## 2020-01-24 DIAGNOSIS — Z9181 History of falling: Secondary | ICD-10-CM | POA: Diagnosis not present

## 2020-01-24 DIAGNOSIS — E785 Hyperlipidemia, unspecified: Secondary | ICD-10-CM | POA: Diagnosis not present

## 2020-01-24 DIAGNOSIS — K219 Gastro-esophageal reflux disease without esophagitis: Secondary | ICD-10-CM | POA: Diagnosis not present

## 2020-01-24 DIAGNOSIS — I824Z2 Acute embolism and thrombosis of unspecified deep veins of left distal lower extremity: Secondary | ICD-10-CM | POA: Diagnosis not present

## 2020-01-24 DIAGNOSIS — L02415 Cutaneous abscess of right lower limb: Secondary | ICD-10-CM | POA: Diagnosis not present

## 2020-01-24 DIAGNOSIS — M170 Bilateral primary osteoarthritis of knee: Secondary | ICD-10-CM | POA: Diagnosis not present

## 2020-01-24 DIAGNOSIS — G2581 Restless legs syndrome: Secondary | ICD-10-CM | POA: Diagnosis not present

## 2020-01-24 DIAGNOSIS — Z8673 Personal history of transient ischemic attack (TIA), and cerebral infarction without residual deficits: Secondary | ICD-10-CM | POA: Diagnosis not present

## 2020-01-24 DIAGNOSIS — L97929 Non-pressure chronic ulcer of unspecified part of left lower leg with unspecified severity: Secondary | ICD-10-CM | POA: Diagnosis not present

## 2020-01-24 DIAGNOSIS — I872 Venous insufficiency (chronic) (peripheral): Secondary | ICD-10-CM | POA: Diagnosis not present

## 2020-01-24 DIAGNOSIS — E1142 Type 2 diabetes mellitus with diabetic polyneuropathy: Secondary | ICD-10-CM | POA: Diagnosis not present

## 2020-01-24 DIAGNOSIS — I25119 Atherosclerotic heart disease of native coronary artery with unspecified angina pectoris: Secondary | ICD-10-CM | POA: Diagnosis not present

## 2020-01-24 DIAGNOSIS — I482 Chronic atrial fibrillation, unspecified: Secondary | ICD-10-CM | POA: Diagnosis not present

## 2020-01-24 DIAGNOSIS — I110 Hypertensive heart disease with heart failure: Secondary | ICD-10-CM | POA: Diagnosis not present

## 2020-01-24 DIAGNOSIS — I5032 Chronic diastolic (congestive) heart failure: Secondary | ICD-10-CM | POA: Diagnosis not present

## 2020-01-24 DIAGNOSIS — J9620 Acute and chronic respiratory failure, unspecified whether with hypoxia or hypercapnia: Secondary | ICD-10-CM | POA: Diagnosis not present

## 2020-01-24 DIAGNOSIS — G4733 Obstructive sleep apnea (adult) (pediatric): Secondary | ICD-10-CM | POA: Diagnosis not present

## 2020-01-24 DIAGNOSIS — J449 Chronic obstructive pulmonary disease, unspecified: Secondary | ICD-10-CM | POA: Diagnosis not present

## 2020-01-24 DIAGNOSIS — E039 Hypothyroidism, unspecified: Secondary | ICD-10-CM | POA: Diagnosis not present

## 2020-01-24 DIAGNOSIS — M797 Fibromyalgia: Secondary | ICD-10-CM | POA: Diagnosis not present

## 2020-01-25 DIAGNOSIS — J9610 Chronic respiratory failure, unspecified whether with hypoxia or hypercapnia: Secondary | ICD-10-CM | POA: Diagnosis not present

## 2020-01-26 ENCOUNTER — Ambulatory Visit: Payer: Medicare Other | Admitting: Podiatry

## 2020-01-27 DIAGNOSIS — E785 Hyperlipidemia, unspecified: Secondary | ICD-10-CM | POA: Diagnosis not present

## 2020-01-27 DIAGNOSIS — Z9884 Bariatric surgery status: Secondary | ICD-10-CM | POA: Diagnosis not present

## 2020-01-27 DIAGNOSIS — L97929 Non-pressure chronic ulcer of unspecified part of left lower leg with unspecified severity: Secondary | ICD-10-CM | POA: Diagnosis not present

## 2020-01-27 DIAGNOSIS — G2581 Restless legs syndrome: Secondary | ICD-10-CM | POA: Diagnosis not present

## 2020-01-27 DIAGNOSIS — E1151 Type 2 diabetes mellitus with diabetic peripheral angiopathy without gangrene: Secondary | ICD-10-CM | POA: Diagnosis not present

## 2020-01-27 DIAGNOSIS — M797 Fibromyalgia: Secondary | ICD-10-CM | POA: Diagnosis not present

## 2020-01-27 DIAGNOSIS — Z8673 Personal history of transient ischemic attack (TIA), and cerebral infarction without residual deficits: Secondary | ICD-10-CM | POA: Diagnosis not present

## 2020-01-27 DIAGNOSIS — I252 Old myocardial infarction: Secondary | ICD-10-CM | POA: Diagnosis not present

## 2020-01-27 DIAGNOSIS — I25119 Atherosclerotic heart disease of native coronary artery with unspecified angina pectoris: Secondary | ICD-10-CM | POA: Diagnosis not present

## 2020-01-27 DIAGNOSIS — L02415 Cutaneous abscess of right lower limb: Secondary | ICD-10-CM | POA: Diagnosis not present

## 2020-01-27 DIAGNOSIS — J9620 Acute and chronic respiratory failure, unspecified whether with hypoxia or hypercapnia: Secondary | ICD-10-CM | POA: Diagnosis not present

## 2020-01-27 DIAGNOSIS — Z9181 History of falling: Secondary | ICD-10-CM | POA: Diagnosis not present

## 2020-01-27 DIAGNOSIS — G4733 Obstructive sleep apnea (adult) (pediatric): Secondary | ICD-10-CM | POA: Diagnosis not present

## 2020-01-27 DIAGNOSIS — J449 Chronic obstructive pulmonary disease, unspecified: Secondary | ICD-10-CM | POA: Diagnosis not present

## 2020-01-27 DIAGNOSIS — I110 Hypertensive heart disease with heart failure: Secondary | ICD-10-CM | POA: Diagnosis not present

## 2020-01-27 DIAGNOSIS — E039 Hypothyroidism, unspecified: Secondary | ICD-10-CM | POA: Diagnosis not present

## 2020-01-27 DIAGNOSIS — I5032 Chronic diastolic (congestive) heart failure: Secondary | ICD-10-CM | POA: Diagnosis not present

## 2020-01-27 DIAGNOSIS — I872 Venous insufficiency (chronic) (peripheral): Secondary | ICD-10-CM | POA: Diagnosis not present

## 2020-01-27 DIAGNOSIS — N184 Chronic kidney disease, stage 4 (severe): Secondary | ICD-10-CM | POA: Diagnosis not present

## 2020-01-27 DIAGNOSIS — R6889 Other general symptoms and signs: Secondary | ICD-10-CM | POA: Diagnosis not present

## 2020-01-27 DIAGNOSIS — E1142 Type 2 diabetes mellitus with diabetic polyneuropathy: Secondary | ICD-10-CM | POA: Diagnosis not present

## 2020-01-27 DIAGNOSIS — B9562 Methicillin resistant Staphylococcus aureus infection as the cause of diseases classified elsewhere: Secondary | ICD-10-CM | POA: Diagnosis not present

## 2020-01-27 DIAGNOSIS — I482 Chronic atrial fibrillation, unspecified: Secondary | ICD-10-CM | POA: Diagnosis not present

## 2020-01-27 DIAGNOSIS — I824Z2 Acute embolism and thrombosis of unspecified deep veins of left distal lower extremity: Secondary | ICD-10-CM | POA: Diagnosis not present

## 2020-01-27 DIAGNOSIS — K219 Gastro-esophageal reflux disease without esophagitis: Secondary | ICD-10-CM | POA: Diagnosis not present

## 2020-01-27 DIAGNOSIS — M170 Bilateral primary osteoarthritis of knee: Secondary | ICD-10-CM | POA: Diagnosis not present

## 2020-01-31 DIAGNOSIS — K219 Gastro-esophageal reflux disease without esophagitis: Secondary | ICD-10-CM | POA: Diagnosis not present

## 2020-01-31 DIAGNOSIS — J449 Chronic obstructive pulmonary disease, unspecified: Secondary | ICD-10-CM | POA: Diagnosis not present

## 2020-01-31 DIAGNOSIS — B9562 Methicillin resistant Staphylococcus aureus infection as the cause of diseases classified elsewhere: Secondary | ICD-10-CM | POA: Diagnosis not present

## 2020-01-31 DIAGNOSIS — E1151 Type 2 diabetes mellitus with diabetic peripheral angiopathy without gangrene: Secondary | ICD-10-CM | POA: Diagnosis not present

## 2020-01-31 DIAGNOSIS — I872 Venous insufficiency (chronic) (peripheral): Secondary | ICD-10-CM | POA: Diagnosis not present

## 2020-01-31 DIAGNOSIS — Z9181 History of falling: Secondary | ICD-10-CM | POA: Diagnosis not present

## 2020-01-31 DIAGNOSIS — E039 Hypothyroidism, unspecified: Secondary | ICD-10-CM | POA: Diagnosis not present

## 2020-01-31 DIAGNOSIS — J9620 Acute and chronic respiratory failure, unspecified whether with hypoxia or hypercapnia: Secondary | ICD-10-CM | POA: Diagnosis not present

## 2020-01-31 DIAGNOSIS — I252 Old myocardial infarction: Secondary | ICD-10-CM | POA: Diagnosis not present

## 2020-01-31 DIAGNOSIS — G2581 Restless legs syndrome: Secondary | ICD-10-CM | POA: Diagnosis not present

## 2020-01-31 DIAGNOSIS — L97929 Non-pressure chronic ulcer of unspecified part of left lower leg with unspecified severity: Secondary | ICD-10-CM | POA: Diagnosis not present

## 2020-01-31 DIAGNOSIS — I5032 Chronic diastolic (congestive) heart failure: Secondary | ICD-10-CM | POA: Diagnosis not present

## 2020-01-31 DIAGNOSIS — G4733 Obstructive sleep apnea (adult) (pediatric): Secondary | ICD-10-CM | POA: Diagnosis not present

## 2020-01-31 DIAGNOSIS — M170 Bilateral primary osteoarthritis of knee: Secondary | ICD-10-CM | POA: Diagnosis not present

## 2020-01-31 DIAGNOSIS — E785 Hyperlipidemia, unspecified: Secondary | ICD-10-CM | POA: Diagnosis not present

## 2020-01-31 DIAGNOSIS — Z9884 Bariatric surgery status: Secondary | ICD-10-CM | POA: Diagnosis not present

## 2020-01-31 DIAGNOSIS — E1142 Type 2 diabetes mellitus with diabetic polyneuropathy: Secondary | ICD-10-CM | POA: Diagnosis not present

## 2020-01-31 DIAGNOSIS — M797 Fibromyalgia: Secondary | ICD-10-CM | POA: Diagnosis not present

## 2020-01-31 DIAGNOSIS — I824Z2 Acute embolism and thrombosis of unspecified deep veins of left distal lower extremity: Secondary | ICD-10-CM | POA: Diagnosis not present

## 2020-01-31 DIAGNOSIS — I482 Chronic atrial fibrillation, unspecified: Secondary | ICD-10-CM | POA: Diagnosis not present

## 2020-01-31 DIAGNOSIS — Z8673 Personal history of transient ischemic attack (TIA), and cerebral infarction without residual deficits: Secondary | ICD-10-CM | POA: Diagnosis not present

## 2020-01-31 DIAGNOSIS — I110 Hypertensive heart disease with heart failure: Secondary | ICD-10-CM | POA: Diagnosis not present

## 2020-01-31 DIAGNOSIS — I25119 Atherosclerotic heart disease of native coronary artery with unspecified angina pectoris: Secondary | ICD-10-CM | POA: Diagnosis not present

## 2020-02-03 DIAGNOSIS — I5032 Chronic diastolic (congestive) heart failure: Secondary | ICD-10-CM | POA: Diagnosis not present

## 2020-02-03 DIAGNOSIS — E039 Hypothyroidism, unspecified: Secondary | ICD-10-CM | POA: Diagnosis not present

## 2020-02-03 DIAGNOSIS — E1151 Type 2 diabetes mellitus with diabetic peripheral angiopathy without gangrene: Secondary | ICD-10-CM | POA: Diagnosis not present

## 2020-02-03 DIAGNOSIS — L97929 Non-pressure chronic ulcer of unspecified part of left lower leg with unspecified severity: Secondary | ICD-10-CM | POA: Diagnosis not present

## 2020-02-03 DIAGNOSIS — K219 Gastro-esophageal reflux disease without esophagitis: Secondary | ICD-10-CM | POA: Diagnosis not present

## 2020-02-03 DIAGNOSIS — J449 Chronic obstructive pulmonary disease, unspecified: Secondary | ICD-10-CM | POA: Diagnosis not present

## 2020-02-03 DIAGNOSIS — I872 Venous insufficiency (chronic) (peripheral): Secondary | ICD-10-CM | POA: Diagnosis not present

## 2020-02-03 DIAGNOSIS — I25119 Atherosclerotic heart disease of native coronary artery with unspecified angina pectoris: Secondary | ICD-10-CM | POA: Diagnosis not present

## 2020-02-03 DIAGNOSIS — I110 Hypertensive heart disease with heart failure: Secondary | ICD-10-CM | POA: Diagnosis not present

## 2020-02-03 DIAGNOSIS — J9620 Acute and chronic respiratory failure, unspecified whether with hypoxia or hypercapnia: Secondary | ICD-10-CM | POA: Diagnosis not present

## 2020-02-03 DIAGNOSIS — I482 Chronic atrial fibrillation, unspecified: Secondary | ICD-10-CM | POA: Diagnosis not present

## 2020-02-03 DIAGNOSIS — I824Z2 Acute embolism and thrombosis of unspecified deep veins of left distal lower extremity: Secondary | ICD-10-CM | POA: Diagnosis not present

## 2020-02-03 DIAGNOSIS — M797 Fibromyalgia: Secondary | ICD-10-CM | POA: Diagnosis not present

## 2020-02-03 DIAGNOSIS — E785 Hyperlipidemia, unspecified: Secondary | ICD-10-CM | POA: Diagnosis not present

## 2020-02-03 DIAGNOSIS — E1142 Type 2 diabetes mellitus with diabetic polyneuropathy: Secondary | ICD-10-CM | POA: Diagnosis not present

## 2020-02-03 DIAGNOSIS — Z9181 History of falling: Secondary | ICD-10-CM | POA: Diagnosis not present

## 2020-02-03 DIAGNOSIS — I252 Old myocardial infarction: Secondary | ICD-10-CM | POA: Diagnosis not present

## 2020-02-03 DIAGNOSIS — Z9884 Bariatric surgery status: Secondary | ICD-10-CM | POA: Diagnosis not present

## 2020-02-03 DIAGNOSIS — M170 Bilateral primary osteoarthritis of knee: Secondary | ICD-10-CM | POA: Diagnosis not present

## 2020-02-03 DIAGNOSIS — G2581 Restless legs syndrome: Secondary | ICD-10-CM | POA: Diagnosis not present

## 2020-02-03 DIAGNOSIS — G4733 Obstructive sleep apnea (adult) (pediatric): Secondary | ICD-10-CM | POA: Diagnosis not present

## 2020-02-03 DIAGNOSIS — Z8673 Personal history of transient ischemic attack (TIA), and cerebral infarction without residual deficits: Secondary | ICD-10-CM | POA: Diagnosis not present

## 2020-02-03 DIAGNOSIS — B9562 Methicillin resistant Staphylococcus aureus infection as the cause of diseases classified elsewhere: Secondary | ICD-10-CM | POA: Diagnosis not present

## 2020-02-07 ENCOUNTER — Telehealth: Payer: Self-pay

## 2020-02-07 DIAGNOSIS — G2581 Restless legs syndrome: Secondary | ICD-10-CM | POA: Diagnosis not present

## 2020-02-07 DIAGNOSIS — J9620 Acute and chronic respiratory failure, unspecified whether with hypoxia or hypercapnia: Secondary | ICD-10-CM | POA: Diagnosis not present

## 2020-02-07 DIAGNOSIS — E1151 Type 2 diabetes mellitus with diabetic peripheral angiopathy without gangrene: Secondary | ICD-10-CM | POA: Diagnosis not present

## 2020-02-07 DIAGNOSIS — B9562 Methicillin resistant Staphylococcus aureus infection as the cause of diseases classified elsewhere: Secondary | ICD-10-CM | POA: Diagnosis not present

## 2020-02-07 DIAGNOSIS — I25119 Atherosclerotic heart disease of native coronary artery with unspecified angina pectoris: Secondary | ICD-10-CM | POA: Diagnosis not present

## 2020-02-07 DIAGNOSIS — G4733 Obstructive sleep apnea (adult) (pediatric): Secondary | ICD-10-CM | POA: Diagnosis not present

## 2020-02-07 DIAGNOSIS — K219 Gastro-esophageal reflux disease without esophagitis: Secondary | ICD-10-CM | POA: Diagnosis not present

## 2020-02-07 DIAGNOSIS — Z9884 Bariatric surgery status: Secondary | ICD-10-CM | POA: Diagnosis not present

## 2020-02-07 DIAGNOSIS — I110 Hypertensive heart disease with heart failure: Secondary | ICD-10-CM | POA: Diagnosis not present

## 2020-02-07 DIAGNOSIS — M797 Fibromyalgia: Secondary | ICD-10-CM | POA: Diagnosis not present

## 2020-02-07 DIAGNOSIS — I252 Old myocardial infarction: Secondary | ICD-10-CM | POA: Diagnosis not present

## 2020-02-07 DIAGNOSIS — E039 Hypothyroidism, unspecified: Secondary | ICD-10-CM | POA: Diagnosis not present

## 2020-02-07 DIAGNOSIS — M170 Bilateral primary osteoarthritis of knee: Secondary | ICD-10-CM | POA: Diagnosis not present

## 2020-02-07 DIAGNOSIS — Z8673 Personal history of transient ischemic attack (TIA), and cerebral infarction without residual deficits: Secondary | ICD-10-CM | POA: Diagnosis not present

## 2020-02-07 DIAGNOSIS — I482 Chronic atrial fibrillation, unspecified: Secondary | ICD-10-CM | POA: Diagnosis not present

## 2020-02-07 DIAGNOSIS — I5032 Chronic diastolic (congestive) heart failure: Secondary | ICD-10-CM | POA: Diagnosis not present

## 2020-02-07 DIAGNOSIS — I872 Venous insufficiency (chronic) (peripheral): Secondary | ICD-10-CM | POA: Diagnosis not present

## 2020-02-07 DIAGNOSIS — I824Z2 Acute embolism and thrombosis of unspecified deep veins of left distal lower extremity: Secondary | ICD-10-CM | POA: Diagnosis not present

## 2020-02-07 DIAGNOSIS — J449 Chronic obstructive pulmonary disease, unspecified: Secondary | ICD-10-CM | POA: Diagnosis not present

## 2020-02-07 DIAGNOSIS — E1142 Type 2 diabetes mellitus with diabetic polyneuropathy: Secondary | ICD-10-CM | POA: Diagnosis not present

## 2020-02-07 DIAGNOSIS — E785 Hyperlipidemia, unspecified: Secondary | ICD-10-CM | POA: Diagnosis not present

## 2020-02-07 DIAGNOSIS — Z9181 History of falling: Secondary | ICD-10-CM | POA: Diagnosis not present

## 2020-02-07 DIAGNOSIS — L97929 Non-pressure chronic ulcer of unspecified part of left lower leg with unspecified severity: Secondary | ICD-10-CM | POA: Diagnosis not present

## 2020-02-07 NOTE — Telephone Encounter (Signed)
I spoke with Jon [name] on the phone and reviewed the nurses concerns about depression and suicidal ideations.  Jon [name] denied any such discussion with the nurse and said that he had no thoughts of suicide or any thoughts of harming anyone.  He refused medication for depression and he refused a counselor at this time.  He expressed concerns about Nurse Vuncannon and he does not want her coming back to his home.  He is going to make the agency aware of his concerns.  He states he feels safe and secure in his current living arrangements and he does not want to move.  He does have an upcoming appointment with Dr. Tobie Poet for follow-up.

## 2020-02-07 NOTE — Chronic Care Management (AMB) (Signed)
Chronic Care Management Pharmacy  Name: EAIN MULLENDORE  MRN: [id number] DOB: 1951-02-16  Chief Complaint/ HPI  Jon [name],  69 y.o. , male presents for their Initial CCM visit with the clinical pharmacist via telephone due to COVID-19 Pandemic.  PCP : Rochel Brome, MD  Their chronic conditions include: Hypothyroidism, GERD, Afib, DM, Neuropathy, RLS, HLD, Vitamin D insuficiency, anemia, OA of both knees, hx of stroke.  Office Visits: 01/17/2020 - Increase hydralazine 25 mg 3 pills three times a day. Continue imdur 120 mg once daily. Continue Cardizem 120 mg once daily.  Continue metoprolol ER 50 mg once daily. Torsemide 20 mg 3 twice a day.Continue levemir 60 U at night. Restart novolog 10 U before supper.Keep sugar log - check sugars before each meal 12/12/2019 - CHANGE LEVEMIR TO 70 U IN AM (AFTER BREAKFAST) START NOVOLOG 10 U BEFORE LUNCH AND SUPPER. 11/21/2019 - pt on bactrim ds for MRSA infection. Has cellulitis of right leg. Ordered unna boot for leg.  11/17/2019 - unna boot wrapped in office until home health can take over. Continue antibiotics. Follow-up in 3-4 days.  11/14/2019 - Clindamycin changed to Bactrim DS for MRSA culture from leg wound.  11/11/2019 - Clindamycin ordered for cellulitis. Rocephin and Toradol administered for pain/infection of leg.  10/11/2019 - Increase Levemir to 45 units [place] in am and decrease Levemir to 25 units [place] in evening. 09/13/2019 - metolazone 2.5 mg m-f daily 3-5 hours before torsemide.  Consult Visit: 12/30/2019 - ED visit - renal failure - changed diuretic and ace to hydralazine. Recommended follow-up with PCP and nephrology. 10/24/2019 - Podiatry foot/nail care.  Medications: Outpatient Encounter Medications as of 02/09/2020  Medication Sig  . albuterol (PROVENTIL) (2.5 MG/3ML) 0.083% nebulizer solution Take 3 mLs (2.5 mg total) by nebulization 2 (two) times daily. And as needed (Patient taking differently: Take 2.5 mg by nebulization  in the morning, at noon, in the evening, and at bedtime. And as needed)  . atorvastatin (LIPITOR) 40 MG tablet Take 40 mg by mouth daily.  . Azelastine HCl 0.15 % SOLN Place 2 sprays into both nostrils daily.   . BD PEN NEEDLE NANO U/F 32G X 4 MM MISC   . clopidogrel (PLAVIX) 75 MG tablet Take 1 tablet (75 mg total) by mouth daily.  Marland Kitchen diltiazem (CARDIZEM CD) 120 MG 24 hr capsule Take 1 capsule (120 mg total) by mouth daily.  Marland Kitchen docusate sodium (COLACE) 100 MG capsule Take 100 mg by mouth 2 (two) times daily.  . DULERA 200-5 MCG/ACT AERO Inhale 1 puff into the lungs in the morning and at bedtime.   . famotidine (PEPCID) 20 MG tablet TAKE 2 TABLETS BY MOUTH TWICE DAILY  . ferrous sulfate 325 (65 FE) MG tablet Take 325 mg by mouth daily.  . Fluticasone Furoate-Vilanterol (BREO ELLIPTA IN) Inhale 200 mcg into the lungs daily.  . Glucagon, rDNA, (GLUCAGON EMERGENCY) 1 MG KIT   . insulin aspart (NOVOLOG) 100 UNIT/ML injection Inject 10 Units into the skin 3 (three) times daily before meals.  . insulin detemir (LEVEMIR FLEXTOUCH) 100 UNIT/ML FlexPen Inject 65 Units into the skin daily. (Patient taking differently: Inject 60 Units into the skin daily. )  . metolazone (ZAROXOLYN) 2.5 MG tablet Take 1 tablet (2.5 mg total) by mouth 2 (two) times a week. (Patient taking differently: Take 2.5 mg by mouth daily. )  . metoprolol succinate (TOPROL-XL) 50 MG 24 hr tablet TAKE 1 TABLET BY MOUTH ONCE DAILY  . MOVANTIK  25 MG TABS tablet SMARTSIG:1 Tablet(s) By Mouth Daily  . nitroGLYCERIN (NITROSTAT) 0.4 MG SL tablet Place 0.4 mg under the tongue every 5 (five) minutes as needed for chest pain.   . OXYGEN Inhale into the lungs. 2 liters at bedtime  . pantoprazole (PROTONIX) 40 MG tablet Take 40 mg by mouth in the morning.   . Potassium Chloride ER 20 MEQ TBCR Take 40 mEq by mouth 2 (two) times daily.   . pregabalin (LYRICA) 75 MG capsule Take 1 capsule (75 mg total) by mouth 2 (two) times daily.  Marland Kitchen RELISTOR 150 MG  TABS TAKE 3 TABLETS BY MOUTH EVERY MORNING  . torsemide (DEMADEX) 20 MG tablet Take 3 tablets (60 mg total) by mouth 2 (two) times daily.  . Vitamin D, Ergocalciferol, (DRISDOL) 1.25 MG (50000 UNIT) CAPS capsule TAKE 1 CAPSULE BY MOUTH DAILY  . XARELTO 15 MG TABS tablet TAKE 1 TABLET BY MOUTH ONCE DAILY WITH EVENING MEAL (Patient taking differently: Take 15 mg by mouth daily after supper. )  . XTAMPZA ER 27 MG C12A TAKE 1 CAPSULE BY MOUTH TWICE DAILY  . [DISCONTINUED] hydrALAZINE (APRESOLINE) 25 MG tablet Take 75 mg by mouth 3 (three) times daily.   . isosorbide mononitrate (IMDUR) 120 MG 24 hr tablet Take 120 mg by mouth daily. (Patient not taking: Reported on 02/09/2020)  . potassium chloride SA (KLOR-CON) 20 MEQ tablet TAKE TWO TABLETS BY MOUTH TWICE A DAY (Patient not taking: Reported on 02/09/2020)   No facility-administered encounter medications on file as of 02/09/2020.     Current Diagnosis/Assessment:  Goals Addressed            This Visit's Progress   . Pharmacy Care Plan       CARE PLAN ENTRY  Current Barriers:  . Chronic Disease Management support, education, and care coordination needs related to Hypertension and Diabetes   Hypertension . Pharmacist Clinical Goal(s): o Over the next 90 days, patient will work with PharmD and providers to achieve BP goal <130/80 . Current regimen:  o hydralazine 75 mg tid o isosorbide mn 120 mg daily o metolazone 2.5 mg daily o torsemide 60 mg bid . Interventions: o Pharmacist requested refill to be sent to pharmacy. o Pharmacist instructed patient to pick up prescription.  o Resume taking hydralazine 75 mg tid. . Patient self care activities - Over the next 90 days, patient will: o Check BP daily, document, and provide at future appointments o Ensure daily salt intake < 2300 mg/day  Hyperlipidemia . Pharmacist Clinical Goal(s): o Over the next 90 days, patient will work with PharmD and providers to maintain LDL goal <  70 . Current regimen:  o Atorvastatin 40 mg daily . Interventions: o Recommend patient continue taking medication as prescribed.  . Patient self care activities - Over the next 90 days, patient will: o Take medication as prescribed   Diabetes . Pharmacist Clinical Goal(s): o Over the next 90 days, patient will work with PharmD and providers to achieve A1c goal <7% . Current regimen:  o glucagon kit o levemir flextouch 60 units daily o novolog 10 units before dinner . Interventions: o Recommend patient begin Levemir 50 units qhs to avoid low fasting blood sugars.  o Recommend patient discontinue Novolog.  o Recommend provider consider Ozempic 0.25 mg weekly for 4 weeks then increase to 0.5 mg weekly thereafter.  . Patient self care activities - Over the next 90 days, patient will: o Check blood sugar twice daily, document,  and provide at future appointments o Contact provider with any episodes of hypoglycemia  Medication management . Pharmacist Clinical Goal(s): o Over the next 90 days, patient will work with PharmD and providers to achieve optimal medication adherence . Current pharmacy: [facility] . Interventions o Comprehensive medication review performed. o Continue current medication management strategy . Patient self care activities - Over the next 90 days, patient will: o Focus on medication adherence by using pill box o Take medications as prescribed o Report any questions or concerns to PharmD and/or provider(s)  Initial goal documentation        Diabetes   Recent Relevant Labs: Lab Results  Component Value Date/Time   HGBA1C 8.6 (H) 12/13/2019 07:51 AM   HGBA1C 8.1 (H) 01/31/2014 03:37 AM    Kidney Function Lab Results  Component Value Date/Time   CREATININE 2.07 (H) 01/17/2020 03:41 PM   CREATININE 2.45 (H) 12/13/2019 07:51 AM   GFRNONAA 31 (L) 01/17/2020 03:41 PM   GFRAA 36 (L) 01/17/2020 03:41 PM    Checking BG: 3x per Day  Recent FBG  Readings: 69, 62, 110, 73, 345 (no Levemir for previous day),   Recent HS BG readings: 472, 415, 360, 463, high 06/08 (no Levemir in 2 days) Patient has failed these meds in past: novolog, humulin, metformin, novolog Patient is currently uncontrolled on the following medications: glucagon kit, levemir flextouch 60 units daily, novolog 10 units before dinner Last diabetic Foot exam: 01/17/2020 Last diabetic Eye exam: last one noted in file 2016   We discussed: diet and exercise extensively. Patient is struggling with am low blood sugars and evenings are high. He stopped taking Levemir for a few days due to am lows.   Patient is frequently having low blood sugars in the morning. He has been taking Levemir 60 units in the evening and unclear but indicates taking Levemir 25 units in the morning as well. He has also been using the Novolog 10 units at lunch regardless if he eats or not. Patient met with Colvin Caroli RD/CDE through [place]. She has recommended patient stop taking Novolog, reduce dose of Levemir to 50 units qhs only, and begin Ozempic 0.25 weekly and increase to 0.5 mg after 4 weeks.  Plan Recommend patient stop using Novolog due to inconsistent meals. Recommend reducing Levemir dose to 50 units qhs. Recommend patient talk to Dr. Tobie Poet about Ozempic to avoid frequent low blood sugars.  Hypertension   BP today is: 155/100 mmHg  Office blood pressures are  BP Readings from Last 3 Encounters:  01/17/20 (!) 170/80  12/12/19 138/68  11/21/19 130/84    Patient has failed these meds in the past: hctz, losartan,  Patient is currently uncontrolled on the following medications: hydralazine 75 mg tid, isosorbide mn 120 mg daily, metolazone 2.5 mg daily, torsemide 60 mg bid  Patient checks BP at home daily  Patient home BP readings are ranging: 163/88, 134/78, 155/100,   We discussed diet and exercise extensively. Patient cannot control his meals and diet due to living situation. Patient  eats what he has access to. Patient does not exercise and is a fall risk. Patient states that he elevates his legs while watching tv. Patient indicates that he is out of hydralazine currently. Pharmacist coordinate refills to be sent in to his pharmacy for him to pick up. Patient acknowledges understanding and will pick up. Patient indicates that he has been increased to metolazone 2.5 mg daily although is unclear which provider increased this medication.  Plan  Continue current medications. Pickup and resume Hydralazine.  Hyperlipidemia   Lipid Panel     Component Value Date/Time   CHOL 117 12/13/2019 0751   TRIG 124 12/13/2019 0751   HDL 40 12/13/2019 0751   LDLCALC 55 12/13/2019 0751     The ASCVD Risk score (Goff DC Jr., et al., 2013) failed to calculate for the following reasons:   The patient has a prior MI or stroke diagnosis   Patient has failed these meds in past: n/a Patient is currently controlled on the following medications:  . Atorvastatin 40 mg daily  We discussed:  diet and exercise extensively. Not really eating a heart healthy diet. Eats whatever his family fixes.  Plan  Continue current medications   AFIB/Hx of CVA   Patient is currently rate controlled. HR 60s-70s BPM  Patient has failed these meds in past: n/a Patient is currently controlled on the following medications: diltiazem 120 mg daily, metorpolol succinate 50 mg daily, Xarelto 15 mg daily, Clopidogrel 75 mg daily We discussed:  Patient's GFR is 31 ml/min and Xarelto appropriately dosed at 15 mg daily.  Plan  Continue current medications   GERD   Patient has failed these meds in past: dexilant Patient is currently controlled on the following medications:  . Pantoprazole 40 mg bid . Famotidine 20 mg bid  We discussed:  Patient is limited to eating what family fixes. If they don't cook, he doesn't eat. He says there are times he doesn't eat meals. Patient has had a prior lap band procedure  but denies reflux symptoms.   Plan  Continue current medications   Opioid induced Constipation   Patient has failed these meds in past: amitiza, mag ox Patient is currently uncontrolled on the following medications:  . Colace 100 mg bid . Movantik 25 mg daily . Relistor 150 mg 3 tabs qam  We discussed:  Patient states that his constipation is uncontrolled on the above medications. Patient states that Miralax does not work for him. Pharmacist will discuss duplicate therapy with Dr. Tobie Poet to determine best next steps. Patient is unable to control his diet/fiber intake to improve symptoms of constipation.   Plan  Recommend patient discuss constipation with Dr. Tobie Poet at next appointment 06/17.    Hypothyroidism   Lab Results  Component Value Date/Time   TSH 5.170 (H) 12/13/2019 07:51 AM   TSH 0.736 02/01/2014 02:15 AM    Patient has failed these meds in past: n/a Patient is currently uncontrolled on the following medications:  . No medication treatment currently  We discussed:  Patient is not taking a medication for elevated TSH currently.   Plan  Continue to monitor thyroid function.   OA of Both Knees   Patient has failed these meds in past: fentanyl, diclofenac, oxycodone er,  Patient is currently uncontrolled on the following medications:  . Xtampza er 27 bid . Pregabalin 75 mg bid  We discussed:  Patient states his pain is not well controlled. Pain is 8-9/10 most of the time. Sits and watches tv all day because he can keep legs up and doesn't hurt as bad.   Plan  Continue current medications   Anemia   CBC Latest Ref Rng & Units 12/13/2019 10/11/2018 09/11/2018  WBC 3.4 - 10.8 x10E3/uL 7.5 - -  Hemoglobin 13.0 - 17.7 g/dL 14.3 11.6(L) 11.0(L)  Hematocrit 37.5 - 51.0 % 43.0 34.0(L) 34.9(L)  Platelets 150 - 450 x10E3/uL 241 - -     Patient has  failed these meds in past: n/a Patient is currently controlled on the following medications:  . Ferrous sulfate 325 mg  daily  We discussed:  Patient's diet is low in meat and vegetables. Last CBC indicated improvement.   Plan  Continue current medications    COPD / Asthma / Tobacco   Last spirometry score: 67%  Gold Grade: Gold 2 (FEV1 50-79%)  Eosinophil count:   Lab Results  Component Value Date/Time   EOSPCT 0 09/01/2018 09:57 PM  %                               Eos (Absolute):  Lab Results  Component Value Date/Time   EOSABS 0.0 09/01/2018 09:57 PM    Tobacco Status:  Social History   Tobacco Use  Smoking Status Former Smoker  . Types: Cigars  . Quit date: 09/01/2006  . Years since quitting: 13.4  Smokeless Tobacco Never Used    Patient has failed these meds in past: flonase, advair,duoneb, anoro ellipta . Patient is currently uncontrolled on the following medications:  . Albuterol 0.083% bid prn . Azelastine 0.15% 2 sprays both nostrils daily . Dulera 200-5 mcg - 1 puff bid . Breo Ellipta 1 puff daily  Using maintenance inhaler regularly? Yes Frequency of nebulized albuterol use: bid and prn per patient  We discussed:  Patient indicates that he is using both Breo and Dulera inhalers at home. His last fill date for [facility] was 09/2019. He has both medications in his box of medicines. He verbally denies missed doses. Erling Cruz are a duplication of therapy Pharmacist will discuss with Dr. Tobie Poet which inhaler to continue.   Plan  Recommend patient discontinue Breo inhaler and continue with Dulera only due to duplication of therapy.    Health Maintenance   Patient is currently uncontrolled on the following medications:  . Nitroglycerin 0.4 mg prn chest pain . Vitamin D 50,000 daily . Potassium Chloride 40 meq bid  We discussed:  Patient states that he is on vitamin d 50,000 daily due to low vitamin D. Last recorded level was 9. Vitamin D3 may help boost his levels better due to activated form. Will assess at next blood work.   Plan  Continue current  medications  Vaccines   Reviewed and discussed patient's vaccination history.     Prevnar 13 (Pneumococcal PCV 13) 06/03/2016  PNEUMOVAX 23 (Pneumococcal PPV23) 11/22/2013  Tdap (Tetanus, reduced diph, acellular pertussis) 08/30/2012  Immunization History  Administered Date(s) Administered  . Influenza Split 10/22/2013  . Influenza,inj,Quad PF,6+ Mos 06/11/2015, 06/01/2016  . PFIZER SARS-COV-2 Vaccination 12/13/2019, 01/13/2020  . Pneumococcal-Unspecified 11/30/2013   Plan  Recommended patient receive annual flu vaccine in office.   Medication Management   Pt uses [facility] for all medications Uses pill box? Yes Pt endorses great compliance  We discussed: Patient states that too many people have been messing with his medicines lately. He wants to stop having insurance nurse come out. Patient is resistant to packaging and wants to continue using his local pharmacy. Patient says his home health nurse is filling up his medications in a box.   Plan  Continue current medication management strategy    Follow up: 1 month phone visit

## 2020-02-07 NOTE — Telephone Encounter (Signed)
Ciro Backer called to report that she has been working with [name] and he is struggling w/suicidal ideation and feelings of isolation.  He had no specific plan but does have thoughts of suicide.  Nurse Sharyl Nimrod did mention that [name] was agreeable to medication and a counselor if Dr. Tobie Poet approves. Symptoms reviewed with Dr. Tobie Poet and She would recommend Zoloft 50 mg daily and a referral to Dr. Salli Quarry for counseling.

## 2020-02-08 NOTE — Patient Instructions (Addendum)
Visit Information  Thank you for your time discussing your medications. I look forward to working with you to achieve your health care goals. Below is a summary of what we talked about during our visit.   Goals Addressed   None     Jon [name] was given information about Chronic Care Management services today including:  1. CCM service includes personalized support from designated clinical staff supervised by his physician, including individualized plan of care and coordination with other care providers 2. 24/7 contact phone numbers for assistance for urgent and routine care needs. 3. Standard insurance, coinsurance, copays and deductibles apply for chronic care management only during months in which we provide at least 20 minutes of these services. Most insurances cover these services at 100%, however patients may be responsible for any copay, coinsurance and/or deductible if applicable. This service may help you avoid the need for more expensive face-to-face services. 4. Only one practitioner may furnish and bill the service in a calendar month. 5. The patient may stop CCM services at any time (effective at the end of the month) by phone call to the office staff.  Patient agreed to services and verbal consent obtained.   The patient verbalized understanding of instructions provided today and agreed to receive a mailed copy of patient instruction and/or educational materials. Telephone follow up appointment with pharmacy team member scheduled for:03/2010  Sherre Poot, PharmD Clinical Pharmacist [place] [phone] (office) [phone] (mobile)  Hypoglycemia Hypoglycemia is when the sugar (glucose) level in your blood is too low. Signs of low blood sugar may include:  Feeling: ? Hungry. ? Worried or nervous (anxious). ? Sweaty and clammy. ? Confused. ? Dizzy. ? Sleepy. ? Sick to your stomach (nauseous).  Having: ? A fast heartbeat. ? A headache. ? A change  in your vision. ? Tingling or no feeling (numbness) around your mouth, lips, or tongue. ? Jerky movements that you cannot control (seizure).  Having trouble with: ? Moving (coordination). ? Sleeping. ? Passing out (fainting). ? Getting upset easily (irritability). Low blood sugar can happen to people who have diabetes and people who do not have diabetes. Low blood sugar can happen quickly, and it can be an emergency. Treating low blood sugar Low blood sugar is often treated by eating or drinking something sugary right away, such as:  Fruit juice, 4-6 oz (120-150 mL).  Regular soda (not diet soda), 4-6 oz (120-150 mL).  Low-fat milk, 4 oz (120 mL).  Several pieces of hard candy.  Sugar or honey, 1 Tbsp (15 mL). Treating low blood sugar if you have diabetes If you can think clearly and swallow safely, follow the 15:15 rule:  Take 15 grams of a fast-acting carb (carbohydrate). Talk with your doctor about how much you should take.  Always keep a source of fast-acting carb with you, such as: ? Sugar tablets (glucose pills). Take 3-4 pills. ? 6-8 pieces of hard candy. ? 4-6 oz (120-150 mL) of fruit juice. ? 4-6 oz (120-150 mL) of regular (not diet) soda. ? 1 Tbsp (15 mL) honey or sugar.  Check your blood sugar 15 minutes after you take the carb.  If your blood sugar is still at or below 70 mg/dL (3.9 mmol/L), take 15 grams of a carb again.  If your blood sugar does not go above 70 mg/dL (3.9 mmol/L) after 3 tries, get help right away.  After your blood sugar goes back to normal, eat a meal or a snack within 1  hour.  Treating very low blood sugar If your blood sugar is at or below 54 mg/dL (3 mmol/L), you have very low blood sugar (severe hypoglycemia). This may also cause:  Passing out.  Jerky movements you cannot control (seizure).  Losing consciousness (coma). This is an emergency. Do not wait to see if the symptoms will go away. Get medical help right away. Call your  local emergency services (911 in the [place] Do not drive yourself to the hospital. If you have very low blood sugar and you cannot eat or drink, you may need a glucagon shot (injection). A family member or friend should learn how to check your blood sugar and how to give you a glucagon shot. Ask your doctor if you need to have a glucagon shot kit at home. Follow these instructions at home: General instructions  Take over-the-counter and prescription medicines only as told by your doctor.  Stay aware of your blood sugar as told by your doctor.  Limit alcohol intake to no more than 1 drink a day for nonpregnant women and 2 drinks a day for men. One drink equals 12 oz of beer (355 mL), 5 oz of wine (148 mL), or 1 oz of hard liquor (44 mL).  Keep all follow-up visits as told by your doctor. This is important. If you have diabetes:   Follow your diabetes care plan as told by your doctor. Make sure you: ? Know the signs of low blood sugar. ? Take your medicines as told. ? Follow your exercise and meal plan. ? Eat on time. Do not skip meals. ? Check your blood sugar as often as told by your doctor. Always check it before and after exercise. ? Follow your sick day plan when you cannot eat or drink normally. Make this plan ahead of time with your doctor.  Share your diabetes care plan with: ? Your work or school. ? People you live with.  Check your pee (urine) for ketones: ? When you are sick. ? As told by your doctor.  Carry a card or wear jewelry that says you have diabetes. Contact a doctor if:  You have trouble keeping your blood sugar in your target range.  You have low blood sugar often. Get help right away if:  You still have symptoms after you eat or drink something sugary.  Your blood sugar is at or below 54 mg/dL (3 mmol/L).  You have jerky movements that you cannot control.  You pass out. These symptoms may be an emergency. Do not wait to see if the symptoms will go  away. Get medical help right away. Call your local emergency services (911 in the [place] Do not drive yourself to the hospital. Summary  Hypoglycemia happens when the level of sugar (glucose) in your blood is too low.  Low blood sugar can happen to people who have diabetes and people who do not have diabetes. Low blood sugar can happen quickly, and it can be an emergency.  Make sure you know the signs of low blood sugar and know how to treat it.  Always keep a source of sugar (fast-acting carb) with you to treat low blood sugar. This information is not intended to replace advice given to you by your health care provider. Make sure you discuss any questions you have with your health care provider. Document Revised: 12/09/2018 Document Reviewed: 09/21/2015 Elsevier Patient Education  2020 [employer].

## 2020-02-09 ENCOUNTER — Ambulatory Visit: Payer: Medicare Other

## 2020-02-09 ENCOUNTER — Other Ambulatory Visit: Payer: Self-pay

## 2020-02-09 DIAGNOSIS — I110 Hypertensive heart disease with heart failure: Secondary | ICD-10-CM

## 2020-02-09 DIAGNOSIS — E782 Mixed hyperlipidemia: Secondary | ICD-10-CM

## 2020-02-09 DIAGNOSIS — E1140 Type 2 diabetes mellitus with diabetic neuropathy, unspecified: Secondary | ICD-10-CM

## 2020-02-09 DIAGNOSIS — Z794 Long term (current) use of insulin: Secondary | ICD-10-CM

## 2020-02-10 ENCOUNTER — Other Ambulatory Visit: Payer: Self-pay

## 2020-02-10 DIAGNOSIS — I110 Hypertensive heart disease with heart failure: Secondary | ICD-10-CM | POA: Diagnosis not present

## 2020-02-10 DIAGNOSIS — G4733 Obstructive sleep apnea (adult) (pediatric): Secondary | ICD-10-CM | POA: Diagnosis not present

## 2020-02-10 DIAGNOSIS — E785 Hyperlipidemia, unspecified: Secondary | ICD-10-CM | POA: Diagnosis not present

## 2020-02-10 DIAGNOSIS — B9562 Methicillin resistant Staphylococcus aureus infection as the cause of diseases classified elsewhere: Secondary | ICD-10-CM | POA: Diagnosis not present

## 2020-02-10 DIAGNOSIS — Z9181 History of falling: Secondary | ICD-10-CM | POA: Diagnosis not present

## 2020-02-10 DIAGNOSIS — J9620 Acute and chronic respiratory failure, unspecified whether with hypoxia or hypercapnia: Secondary | ICD-10-CM | POA: Diagnosis not present

## 2020-02-10 DIAGNOSIS — M170 Bilateral primary osteoarthritis of knee: Secondary | ICD-10-CM | POA: Diagnosis not present

## 2020-02-10 DIAGNOSIS — I5032 Chronic diastolic (congestive) heart failure: Secondary | ICD-10-CM | POA: Diagnosis not present

## 2020-02-10 DIAGNOSIS — E039 Hypothyroidism, unspecified: Secondary | ICD-10-CM | POA: Diagnosis not present

## 2020-02-10 DIAGNOSIS — M797 Fibromyalgia: Secondary | ICD-10-CM | POA: Diagnosis not present

## 2020-02-10 DIAGNOSIS — I872 Venous insufficiency (chronic) (peripheral): Secondary | ICD-10-CM | POA: Diagnosis not present

## 2020-02-10 DIAGNOSIS — G2581 Restless legs syndrome: Secondary | ICD-10-CM | POA: Diagnosis not present

## 2020-02-10 DIAGNOSIS — Z8673 Personal history of transient ischemic attack (TIA), and cerebral infarction without residual deficits: Secondary | ICD-10-CM | POA: Diagnosis not present

## 2020-02-10 DIAGNOSIS — Z9884 Bariatric surgery status: Secondary | ICD-10-CM | POA: Diagnosis not present

## 2020-02-10 DIAGNOSIS — E1151 Type 2 diabetes mellitus with diabetic peripheral angiopathy without gangrene: Secondary | ICD-10-CM | POA: Diagnosis not present

## 2020-02-10 DIAGNOSIS — I252 Old myocardial infarction: Secondary | ICD-10-CM | POA: Diagnosis not present

## 2020-02-10 DIAGNOSIS — I482 Chronic atrial fibrillation, unspecified: Secondary | ICD-10-CM | POA: Diagnosis not present

## 2020-02-10 DIAGNOSIS — J449 Chronic obstructive pulmonary disease, unspecified: Secondary | ICD-10-CM | POA: Diagnosis not present

## 2020-02-10 DIAGNOSIS — I25119 Atherosclerotic heart disease of native coronary artery with unspecified angina pectoris: Secondary | ICD-10-CM | POA: Diagnosis not present

## 2020-02-10 DIAGNOSIS — I824Z2 Acute embolism and thrombosis of unspecified deep veins of left distal lower extremity: Secondary | ICD-10-CM | POA: Diagnosis not present

## 2020-02-10 DIAGNOSIS — E1142 Type 2 diabetes mellitus with diabetic polyneuropathy: Secondary | ICD-10-CM | POA: Diagnosis not present

## 2020-02-10 DIAGNOSIS — K219 Gastro-esophageal reflux disease without esophagitis: Secondary | ICD-10-CM | POA: Diagnosis not present

## 2020-02-10 DIAGNOSIS — L97929 Non-pressure chronic ulcer of unspecified part of left lower leg with unspecified severity: Secondary | ICD-10-CM | POA: Diagnosis not present

## 2020-02-10 MED ORDER — HYDRALAZINE HCL 25 MG PO TABS: 75.0000 mg | ORAL_TABLET | Freq: Three times a day (TID) | ORAL | 0 refills | Status: DC

## 2020-02-11 ENCOUNTER — Encounter: Payer: Self-pay | Admitting: Family Medicine

## 2020-02-11 DIAGNOSIS — F331 Major depressive disorder, recurrent, moderate: Secondary | ICD-10-CM

## 2020-02-11 DIAGNOSIS — J9611 Chronic respiratory failure with hypoxia: Secondary | ICD-10-CM

## 2020-02-11 DIAGNOSIS — J9621 Acute and chronic respiratory failure with hypoxia: Secondary | ICD-10-CM | POA: Insufficient documentation

## 2020-02-11 HISTORY — DX: Morbid (severe) obesity due to excess calories: E66.01

## 2020-02-11 HISTORY — DX: Major depressive disorder, recurrent, moderate: F33.1

## 2020-02-11 HISTORY — DX: Chronic respiratory failure with hypoxia: J96.11

## 2020-02-14 DIAGNOSIS — E1142 Type 2 diabetes mellitus with diabetic polyneuropathy: Secondary | ICD-10-CM | POA: Diagnosis not present

## 2020-02-14 DIAGNOSIS — K219 Gastro-esophageal reflux disease without esophagitis: Secondary | ICD-10-CM | POA: Diagnosis not present

## 2020-02-14 DIAGNOSIS — G4733 Obstructive sleep apnea (adult) (pediatric): Secondary | ICD-10-CM | POA: Diagnosis not present

## 2020-02-14 DIAGNOSIS — I872 Venous insufficiency (chronic) (peripheral): Secondary | ICD-10-CM | POA: Diagnosis not present

## 2020-02-14 DIAGNOSIS — I252 Old myocardial infarction: Secondary | ICD-10-CM | POA: Diagnosis not present

## 2020-02-14 DIAGNOSIS — E1151 Type 2 diabetes mellitus with diabetic peripheral angiopathy without gangrene: Secondary | ICD-10-CM | POA: Diagnosis not present

## 2020-02-14 DIAGNOSIS — Z9884 Bariatric surgery status: Secondary | ICD-10-CM | POA: Diagnosis not present

## 2020-02-14 DIAGNOSIS — E039 Hypothyroidism, unspecified: Secondary | ICD-10-CM | POA: Diagnosis not present

## 2020-02-14 DIAGNOSIS — I482 Chronic atrial fibrillation, unspecified: Secondary | ICD-10-CM | POA: Diagnosis not present

## 2020-02-14 DIAGNOSIS — J9620 Acute and chronic respiratory failure, unspecified whether with hypoxia or hypercapnia: Secondary | ICD-10-CM | POA: Diagnosis not present

## 2020-02-14 DIAGNOSIS — G2581 Restless legs syndrome: Secondary | ICD-10-CM | POA: Diagnosis not present

## 2020-02-14 DIAGNOSIS — Z9181 History of falling: Secondary | ICD-10-CM | POA: Diagnosis not present

## 2020-02-14 DIAGNOSIS — I824Z2 Acute embolism and thrombosis of unspecified deep veins of left distal lower extremity: Secondary | ICD-10-CM | POA: Diagnosis not present

## 2020-02-14 DIAGNOSIS — I5032 Chronic diastolic (congestive) heart failure: Secondary | ICD-10-CM | POA: Diagnosis not present

## 2020-02-14 DIAGNOSIS — E785 Hyperlipidemia, unspecified: Secondary | ICD-10-CM | POA: Diagnosis not present

## 2020-02-14 DIAGNOSIS — I110 Hypertensive heart disease with heart failure: Secondary | ICD-10-CM | POA: Diagnosis not present

## 2020-02-14 DIAGNOSIS — I25119 Atherosclerotic heart disease of native coronary artery with unspecified angina pectoris: Secondary | ICD-10-CM | POA: Diagnosis not present

## 2020-02-14 DIAGNOSIS — B9562 Methicillin resistant Staphylococcus aureus infection as the cause of diseases classified elsewhere: Secondary | ICD-10-CM | POA: Diagnosis not present

## 2020-02-14 DIAGNOSIS — M797 Fibromyalgia: Secondary | ICD-10-CM | POA: Diagnosis not present

## 2020-02-14 DIAGNOSIS — Z8673 Personal history of transient ischemic attack (TIA), and cerebral infarction without residual deficits: Secondary | ICD-10-CM | POA: Diagnosis not present

## 2020-02-14 DIAGNOSIS — M170 Bilateral primary osteoarthritis of knee: Secondary | ICD-10-CM | POA: Diagnosis not present

## 2020-02-14 DIAGNOSIS — J449 Chronic obstructive pulmonary disease, unspecified: Secondary | ICD-10-CM | POA: Diagnosis not present

## 2020-02-14 DIAGNOSIS — L97929 Non-pressure chronic ulcer of unspecified part of left lower leg with unspecified severity: Secondary | ICD-10-CM | POA: Diagnosis not present

## 2020-02-16 ENCOUNTER — Ambulatory Visit (INDEPENDENT_AMBULATORY_CARE_PROVIDER_SITE_OTHER): Payer: Medicare Other | Admitting: Family Medicine

## 2020-02-16 ENCOUNTER — Other Ambulatory Visit: Payer: Self-pay

## 2020-02-16 ENCOUNTER — Encounter: Payer: Self-pay | Admitting: Family Medicine

## 2020-02-16 VITALS — BP 138/84 | HR 89 | Temp 98.2°F | Ht 67.0 in | Wt 263.0 lb

## 2020-02-16 DIAGNOSIS — I110 Hypertensive heart disease with heart failure: Secondary | ICD-10-CM | POA: Diagnosis not present

## 2020-02-16 DIAGNOSIS — K5903 Drug induced constipation: Secondary | ICD-10-CM | POA: Diagnosis not present

## 2020-02-16 DIAGNOSIS — E1121 Type 2 diabetes mellitus with diabetic nephropathy: Secondary | ICD-10-CM

## 2020-02-16 DIAGNOSIS — Z794 Long term (current) use of insulin: Secondary | ICD-10-CM

## 2020-02-16 DIAGNOSIS — I482 Chronic atrial fibrillation, unspecified: Secondary | ICD-10-CM | POA: Diagnosis not present

## 2020-02-16 DIAGNOSIS — N1832 Chronic kidney disease, stage 3b: Secondary | ICD-10-CM

## 2020-02-16 MED ORDER — HYDRALAZINE HCL 100 MG PO TABS: 100.0000 mg | ORAL_TABLET | Freq: Two times a day (BID) | ORAL | 2 refills | Status: DC

## 2020-02-16 NOTE — Patient Instructions (Addendum)
HYPERTENSION:  CHANGE HYDRALAZINE 100 MG ThREE TIMES A DAY.  STOP CARDIZEM.   DIABETES: CONTINUE LEVEMIR 50 U ONCE DAILY.  START OZEMPIC 0.25 MG ONE INJECTION ONCE A WEEK.  KEEP LOG OF SUGARS. I PREFER YOU CHECK SUGARS FOUR TIMES A DAY. WE WILL TRY TO LOWER THE NUMBER OF STICKS PER DAY TO TWICE A DAY IN THE NEAR FUTURE.   FOLLOW UP WITH SARA BETH BROWN, PHD VIA PHONE OR IN PERSON IN 2-3 WEEKS.

## 2020-02-16 NOTE — Progress Notes (Signed)
Subjective:  Patient ID: Jon [name], male    DOB: 12/14/1950  Age: 69 y.o. MRN: [id number]  Chief Complaint  Patient presents with  . Diabetes  . Hypertension    HPI  Patient is a 69 year old white male with significant history of CHF, A. fib, sleep apnea, diabetes with neuropathy,, and obesity who presents for follow up of his diabetes and his hypertension.   Diabetes: Diabetes is poorly controlled.  Patient checks his sugars 4 times a day.  His Levemir has been changed to 50 units daily.  He is not taking any bolus insulin as his eating habits are sporadic.  He was having significant low sugars in the mornings 50 to 60s.  His sugars are now running 106 to 367.  They are highest later in the evening.  He is here today to discuss starting Ozempic.  Hypertension: His blood pressure is well controlled on diltiazem 120 mg once daily, hydralazine 75 mg  3 times a day, Imdur 120 mg once daily, metoprolol XL 50 mg once daily, and torsemide 20 mg 3 tablets twice daily.    He does have problems with chronic constipation.  He currently is on Movantik, Relistor or, over-the-counter stool softeners and he uses MiraLAX as well.  He still only has bowel movements every 2 to 3 days and they are very hard.   Current Outpatient Medications on File Prior to Visit  Medication Sig Dispense Refill  . albuterol (PROVENTIL) (2.5 MG/3ML) 0.083% nebulizer solution Take 3 mLs (2.5 mg total) by nebulization 2 (two) times daily. And as needed (Patient taking differently: Take 2.5 mg by nebulization in the morning, at noon, in the evening, and at bedtime. And as needed) 75 mL 5  . atorvastatin (LIPITOR) 40 MG tablet Take 40 mg by mouth daily.    . Azelastine HCl 0.15 % SOLN Place 2 sprays into both nostrils daily.     . BD PEN NEEDLE NANO U/F 32G X 4 MM MISC     . clopidogrel (PLAVIX) 75 MG tablet Take 1 tablet (75 mg total) by mouth daily. 30 tablet 5  . diltiazem (CARDIZEM CD) 120 MG 24 hr capsule Take 1  capsule (120 mg total) by mouth daily. 90 capsule 0  . docusate sodium (COLACE) 100 MG capsule Take 100 mg by mouth 2 (two) times daily.    . DULERA 200-5 MCG/ACT AERO Inhale 1 puff into the lungs in the morning and at bedtime.     . famotidine (PEPCID) 20 MG tablet TAKE 2 TABLETS BY MOUTH TWICE DAILY 120 tablet 5  . ferrous sulfate 325 (65 FE) MG tablet Take 325 mg by mouth daily.    . Fluticasone Furoate-Vilanterol (BREO ELLIPTA IN) Inhale 200 mcg into the lungs daily.    . Glucagon, rDNA, (GLUCAGON EMERGENCY) 1 MG KIT     . insulin detemir (LEVEMIR FLEXTOUCH) 100 UNIT/ML FlexPen Inject 65 Units into the skin daily. (Patient taking differently: Inject 60 Units into the skin daily. ) 30 mL 2  . isosorbide mononitrate (IMDUR) 120 MG 24 hr tablet Take 120 mg by mouth daily. (Patient not taking: Reported on 02/09/2020)    . metolazone (ZAROXOLYN) 2.5 MG tablet Take 1 tablet (2.5 mg total) by mouth 2 (two) times a week. (Patient taking differently: Take 2.5 mg by mouth daily. ) 30 tablet 3  . metoprolol succinate (TOPROL-XL) 50 MG 24 hr tablet TAKE 1 TABLET BY MOUTH ONCE DAILY 90 tablet 0  . MOVANTIK 25 MG  TABS tablet SMARTSIG:1 Tablet(s) By Mouth Daily    . nitroGLYCERIN (NITROSTAT) 0.4 MG SL tablet Place 0.4 mg under the tongue every 5 (five) minutes as needed for chest pain.     . OXYGEN Inhale into the lungs. 2 liters at bedtime    . pantoprazole (PROTONIX) 40 MG tablet Take 40 mg by mouth in the morning.     . Potassium Chloride ER 20 MEQ TBCR Take 40 mEq by mouth 2 (two) times daily.     . potassium chloride SA (KLOR-CON) 20 MEQ tablet TAKE TWO TABLETS BY MOUTH TWICE A DAY (Patient not taking: Reported on 02/09/2020) 360 tablet 0  . pregabalin (LYRICA) 75 MG capsule Take 1 capsule (75 mg total) by mouth 2 (two) times daily. 60 capsule 0  . RELISTOR 150 MG TABS TAKE 3 TABLETS BY MOUTH EVERY MORNING 90 tablet 0  . torsemide (DEMADEX) 20 MG tablet Take 3 tablets (60 mg total) by mouth 2 (two) times  daily. 180 tablet 0  . Vitamin D, Ergocalciferol, (DRISDOL) 1.25 MG (50000 UNIT) CAPS capsule TAKE 1 CAPSULE BY MOUTH DAILY 90 capsule 1  . XARELTO 15 MG TABS tablet TAKE 1 TABLET BY MOUTH ONCE DAILY WITH EVENING MEAL (Patient taking differently: Take 15 mg by mouth daily after supper. ) 90 tablet 2  . XTAMPZA ER 27 MG C12A TAKE 1 CAPSULE BY MOUTH TWICE DAILY 60 capsule 0   No current facility-administered medications on file prior to visit.   Past Medical History:  Diagnosis Date  . Abnormal EKG 11/29/2014  . Acute renal insufficiency 11/29/2014  . Angina   . Arthritis    "knees" (01/18/2014)  . Asthma   . Atrial fibrillation ([place])   . Back pain 01/25/2015  . 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 BOOP (bronchiolitis obliterans with organizing pneumonia) ([place]) 01/20/2014   OLBx 01/20/14 BOOP Started steroids 02/28/14   . Chronic anticoagulation-Xarelto 02/09/2014  . Chronic atrial fibrillation ([place]) 01/03/2014  . Chronic bronchitis ([place]) 12/21/2013  . Chronic diastolic heart failure ([place]) 02/22/2015  . Chronic pain of both knees 03/18/2017   Overview:  Added automatically from request for surgery [id number]  . Chronic respiratory failure ([place]) 04/10/2015  . COPD (chronic obstructive pulmonary disease) ([place]) 01/25/2015  . Cough    coughing up blood- started last nite, seems to be worse now  . CVA (cerebral vascular accident) ([place]) 02/09/2014  . Dysrhythmia    atrial fib takes cardizem,   . Fibromyalgia   . GERD (gastroesophageal reflux disease)   . H/O hiatal hernia   . Hemoptysis 08/01/2015  . Herpes zoster 06/26/2015  . History of stroke   . Hyperkalemia-repeat pending 11/29/2014  . Hyperlipemia   . Hyperlipidemia   . Hypertension   . Hypothyroidism   . IDDM (insulin dependent diabetes mellitus)    insulin pump   . Long term (current) use of anticoagulants 03/21/2014  . Mild CAD 02/20/2015   Overview:  On recent cardiac cath  Overview:  On recent cardiac cath  . Myocardial infarction [facility])     "one dr says yes; another says no"  . Neuromuscular disorder (HCC)    rls, neuropathy  . Neuropathy    rls, neuropathy   . Normal coronary arteries 2011 11/29/2014  . Obesity (BMI 30-39.9)   . On home oxygen therapy    "2L w/CPAP at bedtime" (09/01/2018)  . OSA on CPAP    cpap & oxygen  . Pericardial effusion   . Peritoneal effusion, chronic  02/22/2015   Overview:  With surgical window  . PNA (pneumonia) 04/10/2015  . Pneumonia 5/15   "several times" (01/18/2014)  . Precordial pain 01/26/2015  . Preoperative cardiovascular examination 02/24/2016  . Primary osteoarthritis of both knees 03/18/2017   Overview:  Added automatically from request for surgery [id number]  . Restless leg syndrome   . Sleep apnea    cpap & oxygen   . Stroke [facility]) 2012   denies residual on 01/18/2014  . Troponin level elevated 11/29/2014  . Uncontrolled type 2 diabetes mellitus with diabetic polyneuropathy, with long-term current use of insulin ([place]) 12/21/2013   Past Surgical History:  Procedure Laterality Date  . ABDOMINAL AORTOGRAM W/LOWER EXTREMITY N/A 09/08/2018   Procedure: ABDOMINAL AORTOGRAM W/LOWER EXTREMITY;  Surgeon: Marty Heck, MD;  Location: [place] [facility];  Service: Cardiovascular;  Laterality: N/A;  . AMPUTATION Right 09/09/2018   Procedure: AMPUTATION RIGHT GREAT TOE;  Surgeon: Waynetta Sandy, MD;  Location: [place];  Service: Vascular;  Laterality: Right;  . APPENDECTOMY    . CARDIAC CATHETERIZATION  X 2 then 03/03/2012    NL LVF, normal coronaries, vessels are small ([facility]: Dr. Beatrix Fetters)  . CATARACT EXTRACTION W/ INTRAOCULAR LENS  IMPLANT, BILATERAL Bilateral   . CHOLECYSTECTOMY    . HERNIA REPAIR     UHR  . LAPAROSCOPIC GASTRIC BANDING  2010  . LOWER EXTREMITY ANGIOGRAPHY Left 10/11/2018   Procedure: LOWER EXTREMITY ANGIOGRAPHY;  Surgeon: Waynetta Sandy, MD;  Location: [place] [facility];  Service: Cardiovascular;  Laterality: Left;  . PERICARDIAL WINDOW Left 01/24/2014     Procedure: PERICARDIAL WINDOW;  Surgeon: Gaye Pollack, MD;  Location: [place];  Service: Thoracic;  Laterality: Left;  . PERIPHERAL VASCULAR INTERVENTION Right 09/08/2018   Procedure: PERIPHERAL VASCULAR INTERVENTION;  Surgeon: Marty Heck, MD;  Location: [place] [facility];  Service: Cardiovascular;  Laterality: Right;  . SINUS EXPLORATION  X 2  . TEE WITHOUT CARDIOVERSION N/A 09/07/2018   Procedure: TRANSESOPHAGEAL ECHOCARDIOGRAM (TEE);  Surgeon: Acie Fredrickson Wonda Cheng, MD;  Location: [place];  Service: Cardiovascular;  Laterality: N/A;  . UMBILICAL HERNIA REPAIR    . VIDEO ASSISTED THORACOSCOPY Left 01/24/2014   Procedure: VIDEO ASSISTED THORACOSCOPY;  Surgeon: Gaye Pollack, MD;  Location: [facility] OR;  Service: Thoracic;  Laterality: Left;  VATS/open lung biopsy  . VIDEO BRONCHOSCOPY Bilateral 12/29/2013   Procedure: VIDEO BRONCHOSCOPY WITHOUT FLUORO;  Surgeon: Elsie Stain, MD;  Location: [facility];  Service: Endoscopy;  Laterality: Bilateral;  . VIDEO BRONCHOSCOPY N/A 01/24/2014   Procedure: VIDEO BRONCHOSCOPY;  Surgeon: Gaye Pollack, MD;  [facility];  Service: Thoracic;  Laterality: N/A;    Family History  Problem Relation Age of Onset  . Cancer Mother   . Stroke Father   . Heart disease Father   . Seizures Sister   . Diabetes Sister   . Anesthesia problems Neg Hx   . Hypotension Neg Hx   . Malignant hyperthermia Neg Hx   . Pseudochol deficiency Neg Hx    Social History   Socioeconomic History  . Marital status: Widowed    Spouse name: Not on file  . Number of children: Not on file  . Years of education: Not on file  . Highest education level: Not on file  Occupational History  . Not on file  Tobacco Use  . Smoking status: Former Smoker    Types: Cigars    Quit date: 09/01/2006    Years since quitting:  13.4  . Smokeless tobacco: Never Used  Vaping Use  . Vaping Use: Never used  Substance and Sexual Activity  . 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 Drug use: No  . Sexual activity: Never  Other Topics Concern  . Not on file  Social History Narrative  . Not on file   Social Determinants of Health   Financial Resource Strain:   . Difficulty of Paying Living Expenses:   Food Insecurity:   . Worried About Charity fundraiser in the Last Year:   . Arboriculturist in the Last Year:   Transportation Needs: No Transportation Needs  . Lack of Transportation (Medical): No  . Lack of Transportation (Non-Medical): No  Physical Activity: Inactive  . Days of Exercise per Week: 0 days  . Minutes of Exercise per Session: 0 min  Stress:   . Feeling of Stress :   Social Connections:   . Frequency of Communication with Friends and Family:   . Frequency of Social Gatherings with Friends and Family:   . Attends Religious Services:   . Active Member of Clubs or Organizations:   . Attends Archivist Meetings:   Marland Kitchen Marital Status:     Review of Systems  Constitutional: Negative for chills and fever.  HENT: Negative for congestion, ear pain and sore throat.   Respiratory: Positive for shortness of breath and wheezing. Negative for cough.   Cardiovascular: Positive for leg swelling. Negative for chest pain.  Gastrointestinal: Positive for abdominal distention and constipation. Negative for abdominal pain, diarrhea, nausea and vomiting.  Endocrine: Positive for polydipsia and polyuria. Negative for polyphagia.  Genitourinary: Negative for decreased urine volume, dysuria and urgency.  Musculoskeletal: Positive for arthralgias and myalgias. Negative for back pain.  Neurological: Negative for dizziness and headaches.  Psychiatric/Behavioral: Negative for dysphoric mood. The patient is not nervous/anxious.      Objective:  Pulse 89   Temp 98.2 F (36.8 C)   Ht '5\' 7"'  (1.702 m)   Wt 263 lb (119.3 kg)   SpO2 99%   BMI 41.19 kg/m   BP/Weight 02/16/2020 01/17/2020 5/40/9811  Systolic BP - 914 782    Diastolic BP - 80 68  Wt. (Lbs) 263 270 262.2  BMI 41.19 42.29 41.07    Physical Exam Vitals reviewed.  Constitutional:      Appearance: Normal appearance. He is normal weight.  Cardiovascular:     Rate and Rhythm: Normal rate. Rhythm irregular.  Pulmonary:     Effort: Pulmonary effort is normal.     Breath sounds: Normal breath sounds.  Abdominal:     General: Abdomen is flat. Bowel sounds are normal.     Palpations: Abdomen is soft.  Skin:    Comments: Legs were just wrapped by home healthcare. Per patient they are improving.   Neurological:     Mental Status: He is alert and oriented to person, place, and time.  Psychiatric:        Mood and Affect: Mood normal.        Behavior: Behavior normal.     Diabetic Foot Exam - Simple   Deferred til next visit.     Lab Results  Component Value Date   WBC 7.5 12/13/2019   HGB 14.3 12/13/2019   HCT 43.0 12/13/2019   PLT 241 12/13/2019   GLUCOSE 257 (H) 01/17/2020   CHOL 117 12/13/2019   TRIG 124 12/13/2019  HDL 40 12/13/2019   LDLCALC 55 12/13/2019   ALT 8 01/17/2020   AST 12 01/17/2020   NA 141 01/17/2020   K 3.6 01/17/2020   CL 99 01/17/2020   CREATININE 2.07 (H) 01/17/2020   BUN 18 01/17/2020   CO2 29 01/17/2020   TSH 5.170 (H) 12/13/2019   INR 1.68 09/01/2018   HGBA1C 8.6 (H) 12/13/2019      Assessment & Plan:   1. Chronic atrial fibrillation (HCC) Stable. Stop cardizem. Continue metoprolol ER. If heart rate were to increase I would Increase her metoprolol ER to 100 mg once daily.  2. Hypertensive heart disease with heart failure (HCC) Change hydralazine 100 mg one pills three times a day. STOP cardizem.  Continue imdur 120 mg once daily, metoprolol ER 50 mg once daily, Torsemide 20 mg 3 twice a day. - Comprehensive metabolic panel  4. Type 2 diabetes mellitus with stage 3b chronic kidney disease, with long-term current use of insulin (HCC) CONTINUE LEVEMIR 50 U ONCE DAILY. START OZEMPIC 0.25 MG ONE  INJECTION ONCE A WEEK.  KEEP LOG OF SUGARS. Continue to [place]. WE WILL TRY TO LOWER THE NUMBER OF STICKS PER DAY TO TWICE A DAY IN THE NEAR FUTURE.    5. Drug induced constipation: Stop cardizem. Continue relistor, movantik, and stool softener for now. Hopefully bowels will start to move and we can decrease meds for constipation.  6. Pedal edema Stop cardizem. Hopefully this may help with edema.  Follow-up: Scheduled in 2-3 weeks with Sherre Poot, PHD and Dr. Holly Bodily in 4 weeks for chronic follow up and fasting labs.  An After Visit Summary was printed and given to the patient.  Rochel Brome [facility] [phone]

## 2020-02-17 DIAGNOSIS — G4733 Obstructive sleep apnea (adult) (pediatric): Secondary | ICD-10-CM | POA: Diagnosis not present

## 2020-02-17 DIAGNOSIS — B9562 Methicillin resistant Staphylococcus aureus infection as the cause of diseases classified elsewhere: Secondary | ICD-10-CM | POA: Diagnosis not present

## 2020-02-17 DIAGNOSIS — J9620 Acute and chronic respiratory failure, unspecified whether with hypoxia or hypercapnia: Secondary | ICD-10-CM | POA: Diagnosis not present

## 2020-02-17 DIAGNOSIS — I25119 Atherosclerotic heart disease of native coronary artery with unspecified angina pectoris: Secondary | ICD-10-CM | POA: Diagnosis not present

## 2020-02-17 DIAGNOSIS — I110 Hypertensive heart disease with heart failure: Secondary | ICD-10-CM | POA: Diagnosis not present

## 2020-02-17 DIAGNOSIS — K219 Gastro-esophageal reflux disease without esophagitis: Secondary | ICD-10-CM | POA: Diagnosis not present

## 2020-02-17 DIAGNOSIS — E039 Hypothyroidism, unspecified: Secondary | ICD-10-CM | POA: Diagnosis not present

## 2020-02-17 DIAGNOSIS — E1142 Type 2 diabetes mellitus with diabetic polyneuropathy: Secondary | ICD-10-CM | POA: Diagnosis not present

## 2020-02-17 DIAGNOSIS — Z8673 Personal history of transient ischemic attack (TIA), and cerebral infarction without residual deficits: Secondary | ICD-10-CM | POA: Diagnosis not present

## 2020-02-17 DIAGNOSIS — L97929 Non-pressure chronic ulcer of unspecified part of left lower leg with unspecified severity: Secondary | ICD-10-CM | POA: Diagnosis not present

## 2020-02-17 DIAGNOSIS — Z9181 History of falling: Secondary | ICD-10-CM | POA: Diagnosis not present

## 2020-02-17 DIAGNOSIS — I252 Old myocardial infarction: Secondary | ICD-10-CM | POA: Diagnosis not present

## 2020-02-17 DIAGNOSIS — Z9884 Bariatric surgery status: Secondary | ICD-10-CM | POA: Diagnosis not present

## 2020-02-17 DIAGNOSIS — M797 Fibromyalgia: Secondary | ICD-10-CM | POA: Diagnosis not present

## 2020-02-17 DIAGNOSIS — I482 Chronic atrial fibrillation, unspecified: Secondary | ICD-10-CM | POA: Diagnosis not present

## 2020-02-17 DIAGNOSIS — E785 Hyperlipidemia, unspecified: Secondary | ICD-10-CM | POA: Diagnosis not present

## 2020-02-17 DIAGNOSIS — I5032 Chronic diastolic (congestive) heart failure: Secondary | ICD-10-CM | POA: Diagnosis not present

## 2020-02-17 DIAGNOSIS — G2581 Restless legs syndrome: Secondary | ICD-10-CM | POA: Diagnosis not present

## 2020-02-17 DIAGNOSIS — E1151 Type 2 diabetes mellitus with diabetic peripheral angiopathy without gangrene: Secondary | ICD-10-CM | POA: Diagnosis not present

## 2020-02-17 DIAGNOSIS — M170 Bilateral primary osteoarthritis of knee: Secondary | ICD-10-CM | POA: Diagnosis not present

## 2020-02-17 DIAGNOSIS — J449 Chronic obstructive pulmonary disease, unspecified: Secondary | ICD-10-CM | POA: Diagnosis not present

## 2020-02-17 DIAGNOSIS — I872 Venous insufficiency (chronic) (peripheral): Secondary | ICD-10-CM | POA: Diagnosis not present

## 2020-02-17 DIAGNOSIS — I824Z2 Acute embolism and thrombosis of unspecified deep veins of left distal lower extremity: Secondary | ICD-10-CM | POA: Diagnosis not present

## 2020-02-20 ENCOUNTER — Other Ambulatory Visit: Payer: Self-pay | Admitting: Family Medicine

## 2020-02-20 ENCOUNTER — Other Ambulatory Visit: Payer: Self-pay | Admitting: Physician Assistant

## 2020-02-21 DIAGNOSIS — M170 Bilateral primary osteoarthritis of knee: Secondary | ICD-10-CM | POA: Diagnosis not present

## 2020-02-21 DIAGNOSIS — G4733 Obstructive sleep apnea (adult) (pediatric): Secondary | ICD-10-CM | POA: Diagnosis not present

## 2020-02-21 DIAGNOSIS — I824Z2 Acute embolism and thrombosis of unspecified deep veins of left distal lower extremity: Secondary | ICD-10-CM | POA: Diagnosis not present

## 2020-02-21 DIAGNOSIS — E1142 Type 2 diabetes mellitus with diabetic polyneuropathy: Secondary | ICD-10-CM | POA: Diagnosis not present

## 2020-02-21 DIAGNOSIS — J449 Chronic obstructive pulmonary disease, unspecified: Secondary | ICD-10-CM | POA: Diagnosis not present

## 2020-02-21 DIAGNOSIS — E1151 Type 2 diabetes mellitus with diabetic peripheral angiopathy without gangrene: Secondary | ICD-10-CM | POA: Diagnosis not present

## 2020-02-21 DIAGNOSIS — I872 Venous insufficiency (chronic) (peripheral): Secondary | ICD-10-CM | POA: Diagnosis not present

## 2020-02-21 DIAGNOSIS — I25119 Atherosclerotic heart disease of native coronary artery with unspecified angina pectoris: Secondary | ICD-10-CM | POA: Diagnosis not present

## 2020-02-21 DIAGNOSIS — I5032 Chronic diastolic (congestive) heart failure: Secondary | ICD-10-CM | POA: Diagnosis not present

## 2020-02-21 DIAGNOSIS — E785 Hyperlipidemia, unspecified: Secondary | ICD-10-CM | POA: Diagnosis not present

## 2020-02-21 DIAGNOSIS — M797 Fibromyalgia: Secondary | ICD-10-CM | POA: Diagnosis not present

## 2020-02-21 DIAGNOSIS — I482 Chronic atrial fibrillation, unspecified: Secondary | ICD-10-CM | POA: Diagnosis not present

## 2020-02-21 DIAGNOSIS — L97929 Non-pressure chronic ulcer of unspecified part of left lower leg with unspecified severity: Secondary | ICD-10-CM | POA: Diagnosis not present

## 2020-02-21 DIAGNOSIS — K219 Gastro-esophageal reflux disease without esophagitis: Secondary | ICD-10-CM | POA: Diagnosis not present

## 2020-02-21 DIAGNOSIS — G2581 Restless legs syndrome: Secondary | ICD-10-CM | POA: Diagnosis not present

## 2020-02-21 DIAGNOSIS — I110 Hypertensive heart disease with heart failure: Secondary | ICD-10-CM | POA: Diagnosis not present

## 2020-02-21 DIAGNOSIS — J9620 Acute and chronic respiratory failure, unspecified whether with hypoxia or hypercapnia: Secondary | ICD-10-CM | POA: Diagnosis not present

## 2020-02-21 DIAGNOSIS — Z8673 Personal history of transient ischemic attack (TIA), and cerebral infarction without residual deficits: Secondary | ICD-10-CM | POA: Diagnosis not present

## 2020-02-21 DIAGNOSIS — E039 Hypothyroidism, unspecified: Secondary | ICD-10-CM | POA: Diagnosis not present

## 2020-02-21 DIAGNOSIS — Z9181 History of falling: Secondary | ICD-10-CM | POA: Diagnosis not present

## 2020-02-21 DIAGNOSIS — Z9884 Bariatric surgery status: Secondary | ICD-10-CM | POA: Diagnosis not present

## 2020-02-21 DIAGNOSIS — B9562 Methicillin resistant Staphylococcus aureus infection as the cause of diseases classified elsewhere: Secondary | ICD-10-CM | POA: Diagnosis not present

## 2020-02-21 DIAGNOSIS — I252 Old myocardial infarction: Secondary | ICD-10-CM | POA: Diagnosis not present

## 2020-02-23 DIAGNOSIS — J9610 Chronic respiratory failure, unspecified whether with hypoxia or hypercapnia: Secondary | ICD-10-CM | POA: Diagnosis not present

## 2020-02-24 DIAGNOSIS — J449 Chronic obstructive pulmonary disease, unspecified: Secondary | ICD-10-CM | POA: Diagnosis not present

## 2020-02-24 DIAGNOSIS — I110 Hypertensive heart disease with heart failure: Secondary | ICD-10-CM | POA: Diagnosis not present

## 2020-02-24 DIAGNOSIS — G2581 Restless legs syndrome: Secondary | ICD-10-CM | POA: Diagnosis not present

## 2020-02-24 DIAGNOSIS — J9620 Acute and chronic respiratory failure, unspecified whether with hypoxia or hypercapnia: Secondary | ICD-10-CM | POA: Diagnosis not present

## 2020-02-24 DIAGNOSIS — M170 Bilateral primary osteoarthritis of knee: Secondary | ICD-10-CM | POA: Diagnosis not present

## 2020-02-24 DIAGNOSIS — E1142 Type 2 diabetes mellitus with diabetic polyneuropathy: Secondary | ICD-10-CM | POA: Diagnosis not present

## 2020-02-24 DIAGNOSIS — I482 Chronic atrial fibrillation, unspecified: Secondary | ICD-10-CM | POA: Diagnosis not present

## 2020-02-24 DIAGNOSIS — Z9884 Bariatric surgery status: Secondary | ICD-10-CM | POA: Diagnosis not present

## 2020-02-24 DIAGNOSIS — G4733 Obstructive sleep apnea (adult) (pediatric): Secondary | ICD-10-CM | POA: Diagnosis not present

## 2020-02-24 DIAGNOSIS — M797 Fibromyalgia: Secondary | ICD-10-CM | POA: Diagnosis not present

## 2020-02-24 DIAGNOSIS — I5032 Chronic diastolic (congestive) heart failure: Secondary | ICD-10-CM | POA: Diagnosis not present

## 2020-02-24 DIAGNOSIS — Z9181 History of falling: Secondary | ICD-10-CM | POA: Diagnosis not present

## 2020-02-24 DIAGNOSIS — I25119 Atherosclerotic heart disease of native coronary artery with unspecified angina pectoris: Secondary | ICD-10-CM | POA: Diagnosis not present

## 2020-02-24 DIAGNOSIS — E785 Hyperlipidemia, unspecified: Secondary | ICD-10-CM | POA: Diagnosis not present

## 2020-02-24 DIAGNOSIS — I252 Old myocardial infarction: Secondary | ICD-10-CM | POA: Diagnosis not present

## 2020-02-24 DIAGNOSIS — B9562 Methicillin resistant Staphylococcus aureus infection as the cause of diseases classified elsewhere: Secondary | ICD-10-CM | POA: Diagnosis not present

## 2020-02-24 DIAGNOSIS — E1151 Type 2 diabetes mellitus with diabetic peripheral angiopathy without gangrene: Secondary | ICD-10-CM | POA: Diagnosis not present

## 2020-02-24 DIAGNOSIS — I824Z2 Acute embolism and thrombosis of unspecified deep veins of left distal lower extremity: Secondary | ICD-10-CM | POA: Diagnosis not present

## 2020-02-24 DIAGNOSIS — L97929 Non-pressure chronic ulcer of unspecified part of left lower leg with unspecified severity: Secondary | ICD-10-CM | POA: Diagnosis not present

## 2020-02-24 DIAGNOSIS — I872 Venous insufficiency (chronic) (peripheral): Secondary | ICD-10-CM | POA: Diagnosis not present

## 2020-02-24 DIAGNOSIS — K219 Gastro-esophageal reflux disease without esophagitis: Secondary | ICD-10-CM | POA: Diagnosis not present

## 2020-02-24 DIAGNOSIS — Z8673 Personal history of transient ischemic attack (TIA), and cerebral infarction without residual deficits: Secondary | ICD-10-CM | POA: Diagnosis not present

## 2020-02-24 DIAGNOSIS — E039 Hypothyroidism, unspecified: Secondary | ICD-10-CM | POA: Diagnosis not present

## 2020-02-25 DIAGNOSIS — J9610 Chronic respiratory failure, unspecified whether with hypoxia or hypercapnia: Secondary | ICD-10-CM | POA: Diagnosis not present

## 2020-02-27 DIAGNOSIS — I824Z2 Acute embolism and thrombosis of unspecified deep veins of left distal lower extremity: Secondary | ICD-10-CM | POA: Diagnosis not present

## 2020-02-27 DIAGNOSIS — J9620 Acute and chronic respiratory failure, unspecified whether with hypoxia or hypercapnia: Secondary | ICD-10-CM | POA: Diagnosis not present

## 2020-02-27 DIAGNOSIS — J449 Chronic obstructive pulmonary disease, unspecified: Secondary | ICD-10-CM | POA: Diagnosis not present

## 2020-02-27 DIAGNOSIS — E039 Hypothyroidism, unspecified: Secondary | ICD-10-CM | POA: Diagnosis not present

## 2020-02-27 DIAGNOSIS — I482 Chronic atrial fibrillation, unspecified: Secondary | ICD-10-CM | POA: Diagnosis not present

## 2020-02-27 DIAGNOSIS — E1142 Type 2 diabetes mellitus with diabetic polyneuropathy: Secondary | ICD-10-CM | POA: Diagnosis not present

## 2020-02-27 DIAGNOSIS — B9562 Methicillin resistant Staphylococcus aureus infection as the cause of diseases classified elsewhere: Secondary | ICD-10-CM | POA: Diagnosis not present

## 2020-02-27 DIAGNOSIS — M170 Bilateral primary osteoarthritis of knee: Secondary | ICD-10-CM | POA: Diagnosis not present

## 2020-02-27 DIAGNOSIS — L97929 Non-pressure chronic ulcer of unspecified part of left lower leg with unspecified severity: Secondary | ICD-10-CM | POA: Diagnosis not present

## 2020-02-27 DIAGNOSIS — E1151 Type 2 diabetes mellitus with diabetic peripheral angiopathy without gangrene: Secondary | ICD-10-CM | POA: Diagnosis not present

## 2020-02-27 DIAGNOSIS — M797 Fibromyalgia: Secondary | ICD-10-CM | POA: Diagnosis not present

## 2020-02-27 DIAGNOSIS — Z9181 History of falling: Secondary | ICD-10-CM | POA: Diagnosis not present

## 2020-02-27 DIAGNOSIS — I872 Venous insufficiency (chronic) (peripheral): Secondary | ICD-10-CM | POA: Diagnosis not present

## 2020-02-27 DIAGNOSIS — I110 Hypertensive heart disease with heart failure: Secondary | ICD-10-CM | POA: Diagnosis not present

## 2020-02-27 DIAGNOSIS — I25119 Atherosclerotic heart disease of native coronary artery with unspecified angina pectoris: Secondary | ICD-10-CM | POA: Diagnosis not present

## 2020-02-27 DIAGNOSIS — Z9884 Bariatric surgery status: Secondary | ICD-10-CM | POA: Diagnosis not present

## 2020-02-27 DIAGNOSIS — I252 Old myocardial infarction: Secondary | ICD-10-CM | POA: Diagnosis not present

## 2020-02-27 DIAGNOSIS — E785 Hyperlipidemia, unspecified: Secondary | ICD-10-CM | POA: Diagnosis not present

## 2020-02-27 DIAGNOSIS — K219 Gastro-esophageal reflux disease without esophagitis: Secondary | ICD-10-CM | POA: Diagnosis not present

## 2020-02-27 DIAGNOSIS — I5032 Chronic diastolic (congestive) heart failure: Secondary | ICD-10-CM | POA: Diagnosis not present

## 2020-02-27 DIAGNOSIS — G4733 Obstructive sleep apnea (adult) (pediatric): Secondary | ICD-10-CM | POA: Diagnosis not present

## 2020-02-27 DIAGNOSIS — G2581 Restless legs syndrome: Secondary | ICD-10-CM | POA: Diagnosis not present

## 2020-02-27 DIAGNOSIS — Z8673 Personal history of transient ischemic attack (TIA), and cerebral infarction without residual deficits: Secondary | ICD-10-CM | POA: Diagnosis not present

## 2020-03-02 DIAGNOSIS — M797 Fibromyalgia: Secondary | ICD-10-CM | POA: Diagnosis not present

## 2020-03-02 DIAGNOSIS — I252 Old myocardial infarction: Secondary | ICD-10-CM | POA: Diagnosis not present

## 2020-03-02 DIAGNOSIS — N179 Acute kidney failure, unspecified: Secondary | ICD-10-CM | POA: Diagnosis not present

## 2020-03-02 DIAGNOSIS — N183 Chronic kidney disease, stage 3 unspecified: Secondary | ICD-10-CM | POA: Diagnosis not present

## 2020-03-02 DIAGNOSIS — I130 Hypertensive heart and chronic kidney disease with heart failure and stage 1 through stage 4 chronic kidney disease, or unspecified chronic kidney disease: Secondary | ICD-10-CM | POA: Diagnosis not present

## 2020-03-02 DIAGNOSIS — K219 Gastro-esophageal reflux disease without esophagitis: Secondary | ICD-10-CM | POA: Diagnosis not present

## 2020-03-02 DIAGNOSIS — E871 Hypo-osmolality and hyponatremia: Secondary | ICD-10-CM | POA: Diagnosis not present

## 2020-03-02 DIAGNOSIS — E1142 Type 2 diabetes mellitus with diabetic polyneuropathy: Secondary | ICD-10-CM | POA: Diagnosis not present

## 2020-03-02 DIAGNOSIS — S62111A Displaced fracture of triquetrum [cuneiform] bone, right wrist, initial encounter for closed fracture: Secondary | ICD-10-CM | POA: Diagnosis not present

## 2020-03-02 DIAGNOSIS — H02843 Edema of right eye, unspecified eyelid: Secondary | ICD-10-CM | POA: Diagnosis not present

## 2020-03-02 DIAGNOSIS — S199XXA Unspecified injury of neck, initial encounter: Secondary | ICD-10-CM | POA: Diagnosis not present

## 2020-03-02 DIAGNOSIS — Z8673 Personal history of transient ischemic attack (TIA), and cerebral infarction without residual deficits: Secondary | ICD-10-CM | POA: Diagnosis not present

## 2020-03-02 DIAGNOSIS — I482 Chronic atrial fibrillation, unspecified: Secondary | ICD-10-CM | POA: Diagnosis not present

## 2020-03-02 DIAGNOSIS — E039 Hypothyroidism, unspecified: Secondary | ICD-10-CM | POA: Diagnosis not present

## 2020-03-02 DIAGNOSIS — K529 Noninfective gastroenteritis and colitis, unspecified: Secondary | ICD-10-CM | POA: Diagnosis not present

## 2020-03-02 DIAGNOSIS — S62114A Nondisplaced fracture of triquetrum [cuneiform] bone, right wrist, initial encounter for closed fracture: Secondary | ICD-10-CM | POA: Diagnosis not present

## 2020-03-02 DIAGNOSIS — I824Z2 Acute embolism and thrombosis of unspecified deep veins of left distal lower extremity: Secondary | ICD-10-CM | POA: Diagnosis not present

## 2020-03-02 DIAGNOSIS — M25461 Effusion, right knee: Secondary | ICD-10-CM | POA: Diagnosis not present

## 2020-03-02 DIAGNOSIS — S62656A Nondisplaced fracture of medial phalanx of right little finger, initial encounter for closed fracture: Secondary | ICD-10-CM | POA: Diagnosis not present

## 2020-03-02 DIAGNOSIS — M25462 Effusion, left knee: Secondary | ICD-10-CM | POA: Diagnosis not present

## 2020-03-02 DIAGNOSIS — S8001XA Contusion of right knee, initial encounter: Secondary | ICD-10-CM | POA: Diagnosis not present

## 2020-03-02 DIAGNOSIS — G2581 Restless legs syndrome: Secondary | ICD-10-CM | POA: Diagnosis not present

## 2020-03-02 DIAGNOSIS — Z9981 Dependence on supplemental oxygen: Secondary | ICD-10-CM | POA: Diagnosis not present

## 2020-03-02 DIAGNOSIS — S299XXA Unspecified injury of thorax, initial encounter: Secondary | ICD-10-CM | POA: Diagnosis not present

## 2020-03-02 DIAGNOSIS — E785 Hyperlipidemia, unspecified: Secondary | ICD-10-CM | POA: Diagnosis not present

## 2020-03-02 DIAGNOSIS — S62646A Nondisplaced fracture of proximal phalanx of right little finger, initial encounter for closed fracture: Secondary | ICD-10-CM | POA: Diagnosis not present

## 2020-03-02 DIAGNOSIS — I25119 Atherosclerotic heart disease of native coronary artery with unspecified angina pectoris: Secondary | ICD-10-CM | POA: Diagnosis not present

## 2020-03-02 DIAGNOSIS — J9620 Acute and chronic respiratory failure, unspecified whether with hypoxia or hypercapnia: Secondary | ICD-10-CM | POA: Diagnosis not present

## 2020-03-02 DIAGNOSIS — E1151 Type 2 diabetes mellitus with diabetic peripheral angiopathy without gangrene: Secondary | ICD-10-CM | POA: Diagnosis not present

## 2020-03-02 DIAGNOSIS — J449 Chronic obstructive pulmonary disease, unspecified: Secondary | ICD-10-CM | POA: Diagnosis not present

## 2020-03-02 DIAGNOSIS — E1122 Type 2 diabetes mellitus with diabetic chronic kidney disease: Secondary | ICD-10-CM | POA: Diagnosis not present

## 2020-03-02 DIAGNOSIS — I872 Venous insufficiency (chronic) (peripheral): Secondary | ICD-10-CM | POA: Diagnosis not present

## 2020-03-02 DIAGNOSIS — I5032 Chronic diastolic (congestive) heart failure: Secondary | ICD-10-CM | POA: Diagnosis not present

## 2020-03-02 DIAGNOSIS — S3991XA Unspecified injury of abdomen, initial encounter: Secondary | ICD-10-CM | POA: Diagnosis not present

## 2020-03-02 DIAGNOSIS — G4733 Obstructive sleep apnea (adult) (pediatric): Secondary | ICD-10-CM | POA: Diagnosis not present

## 2020-03-02 DIAGNOSIS — S0993XA Unspecified injury of face, initial encounter: Secondary | ICD-10-CM | POA: Diagnosis not present

## 2020-03-02 DIAGNOSIS — M170 Bilateral primary osteoarthritis of knee: Secondary | ICD-10-CM | POA: Diagnosis not present

## 2020-03-02 DIAGNOSIS — L97929 Non-pressure chronic ulcer of unspecified part of left lower leg with unspecified severity: Secondary | ICD-10-CM | POA: Diagnosis not present

## 2020-03-02 DIAGNOSIS — S0990XA Unspecified injury of head, initial encounter: Secondary | ICD-10-CM | POA: Diagnosis not present

## 2020-03-02 DIAGNOSIS — L97811 Non-pressure chronic ulcer of other part of right lower leg limited to breakdown of skin: Secondary | ICD-10-CM | POA: Diagnosis not present

## 2020-03-03 DIAGNOSIS — N179 Acute kidney failure, unspecified: Secondary | ICD-10-CM | POA: Diagnosis not present

## 2020-03-03 DIAGNOSIS — H02843 Edema of right eye, unspecified eyelid: Secondary | ICD-10-CM | POA: Diagnosis not present

## 2020-03-03 DIAGNOSIS — S0990XA Unspecified injury of head, initial encounter: Secondary | ICD-10-CM | POA: Diagnosis not present

## 2020-03-03 DIAGNOSIS — M25462 Effusion, left knee: Secondary | ICD-10-CM | POA: Diagnosis not present

## 2020-03-03 DIAGNOSIS — S8001XA Contusion of right knee, initial encounter: Secondary | ICD-10-CM | POA: Diagnosis not present

## 2020-03-03 DIAGNOSIS — E1165 Type 2 diabetes mellitus with hyperglycemia: Secondary | ICD-10-CM | POA: Diagnosis not present

## 2020-03-03 DIAGNOSIS — K219 Gastro-esophageal reflux disease without esophagitis: Secondary | ICD-10-CM | POA: Diagnosis not present

## 2020-03-03 DIAGNOSIS — E7800 Pure hypercholesterolemia, unspecified: Secondary | ICD-10-CM | POA: Diagnosis not present

## 2020-03-03 DIAGNOSIS — J449 Chronic obstructive pulmonary disease, unspecified: Secondary | ICD-10-CM | POA: Diagnosis not present

## 2020-03-03 DIAGNOSIS — Z880 Allergy status to penicillin: Secondary | ICD-10-CM | POA: Diagnosis not present

## 2020-03-03 DIAGNOSIS — E039 Hypothyroidism, unspecified: Secondary | ICD-10-CM | POA: Diagnosis not present

## 2020-03-03 DIAGNOSIS — S199XXA Unspecified injury of neck, initial encounter: Secondary | ICD-10-CM | POA: Diagnosis not present

## 2020-03-03 DIAGNOSIS — T452X1A Poisoning by vitamins, accidental (unintentional), initial encounter: Secondary | ICD-10-CM | POA: Diagnosis not present

## 2020-03-03 DIAGNOSIS — S299XXA Unspecified injury of thorax, initial encounter: Secondary | ICD-10-CM | POA: Diagnosis not present

## 2020-03-03 DIAGNOSIS — Z7902 Long term (current) use of antithrombotics/antiplatelets: Secondary | ICD-10-CM | POA: Diagnosis not present

## 2020-03-03 DIAGNOSIS — S62646A Nondisplaced fracture of proximal phalanx of right little finger, initial encounter for closed fracture: Secondary | ICD-10-CM | POA: Diagnosis not present

## 2020-03-03 DIAGNOSIS — M25461 Effusion, right knee: Secondary | ICD-10-CM | POA: Diagnosis not present

## 2020-03-03 DIAGNOSIS — E876 Hypokalemia: Secondary | ICD-10-CM | POA: Diagnosis not present

## 2020-03-03 DIAGNOSIS — S3991XA Unspecified injury of abdomen, initial encounter: Secondary | ICD-10-CM | POA: Diagnosis not present

## 2020-03-03 DIAGNOSIS — S62111A Displaced fracture of triquetrum [cuneiform] bone, right wrist, initial encounter for closed fracture: Secondary | ICD-10-CM | POA: Diagnosis not present

## 2020-03-03 DIAGNOSIS — N183 Chronic kidney disease, stage 3 unspecified: Secondary | ICD-10-CM | POA: Diagnosis not present

## 2020-03-03 DIAGNOSIS — S62114A Nondisplaced fracture of triquetrum [cuneiform] bone, right wrist, initial encounter for closed fracture: Secondary | ICD-10-CM | POA: Diagnosis not present

## 2020-03-03 DIAGNOSIS — I252 Old myocardial infarction: Secondary | ICD-10-CM | POA: Diagnosis not present

## 2020-03-03 DIAGNOSIS — I129 Hypertensive chronic kidney disease with stage 1 through stage 4 chronic kidney disease, or unspecified chronic kidney disease: Secondary | ICD-10-CM | POA: Diagnosis not present

## 2020-03-03 DIAGNOSIS — I4891 Unspecified atrial fibrillation: Secondary | ICD-10-CM | POA: Diagnosis not present

## 2020-03-03 DIAGNOSIS — Z8673 Personal history of transient ischemic attack (TIA), and cerebral infarction without residual deficits: Secondary | ICD-10-CM | POA: Diagnosis not present

## 2020-03-03 DIAGNOSIS — K529 Noninfective gastroenteritis and colitis, unspecified: Secondary | ICD-10-CM | POA: Diagnosis not present

## 2020-03-03 DIAGNOSIS — E871 Hypo-osmolality and hyponatremia: Secondary | ICD-10-CM | POA: Diagnosis not present

## 2020-03-03 DIAGNOSIS — S62656A Nondisplaced fracture of medial phalanx of right little finger, initial encounter for closed fracture: Secondary | ICD-10-CM | POA: Diagnosis not present

## 2020-03-03 DIAGNOSIS — E1122 Type 2 diabetes mellitus with diabetic chronic kidney disease: Secondary | ICD-10-CM | POA: Diagnosis not present

## 2020-03-03 DIAGNOSIS — S0993XA Unspecified injury of face, initial encounter: Secondary | ICD-10-CM | POA: Diagnosis not present

## 2020-03-03 DIAGNOSIS — M1990 Unspecified osteoarthritis, unspecified site: Secondary | ICD-10-CM | POA: Diagnosis not present

## 2020-03-03 DIAGNOSIS — Z885 Allergy status to narcotic agent status: Secondary | ICD-10-CM | POA: Diagnosis not present

## 2020-03-03 DIAGNOSIS — I2510 Atherosclerotic heart disease of native coronary artery without angina pectoris: Secondary | ICD-10-CM | POA: Diagnosis not present

## 2020-03-03 DIAGNOSIS — E860 Dehydration: Secondary | ICD-10-CM | POA: Diagnosis not present

## 2020-03-06 ENCOUNTER — Telehealth: Payer: Self-pay

## 2020-03-06 DIAGNOSIS — G2581 Restless legs syndrome: Secondary | ICD-10-CM | POA: Diagnosis not present

## 2020-03-06 DIAGNOSIS — N183 Chronic kidney disease, stage 3 unspecified: Secondary | ICD-10-CM | POA: Diagnosis not present

## 2020-03-06 DIAGNOSIS — G4733 Obstructive sleep apnea (adult) (pediatric): Secondary | ICD-10-CM | POA: Diagnosis not present

## 2020-03-06 DIAGNOSIS — E1122 Type 2 diabetes mellitus with diabetic chronic kidney disease: Secondary | ICD-10-CM | POA: Diagnosis not present

## 2020-03-06 DIAGNOSIS — M797 Fibromyalgia: Secondary | ICD-10-CM | POA: Diagnosis not present

## 2020-03-06 DIAGNOSIS — L97811 Non-pressure chronic ulcer of other part of right lower leg limited to breakdown of skin: Secondary | ICD-10-CM | POA: Diagnosis not present

## 2020-03-06 DIAGNOSIS — I5032 Chronic diastolic (congestive) heart failure: Secondary | ICD-10-CM | POA: Diagnosis not present

## 2020-03-06 DIAGNOSIS — I872 Venous insufficiency (chronic) (peripheral): Secondary | ICD-10-CM | POA: Diagnosis not present

## 2020-03-06 DIAGNOSIS — E1142 Type 2 diabetes mellitus with diabetic polyneuropathy: Secondary | ICD-10-CM | POA: Diagnosis not present

## 2020-03-06 DIAGNOSIS — E785 Hyperlipidemia, unspecified: Secondary | ICD-10-CM | POA: Diagnosis not present

## 2020-03-06 DIAGNOSIS — I824Z2 Acute embolism and thrombosis of unspecified deep veins of left distal lower extremity: Secondary | ICD-10-CM | POA: Diagnosis not present

## 2020-03-06 DIAGNOSIS — I25119 Atherosclerotic heart disease of native coronary artery with unspecified angina pectoris: Secondary | ICD-10-CM | POA: Diagnosis not present

## 2020-03-06 DIAGNOSIS — I130 Hypertensive heart and chronic kidney disease with heart failure and stage 1 through stage 4 chronic kidney disease, or unspecified chronic kidney disease: Secondary | ICD-10-CM | POA: Diagnosis not present

## 2020-03-06 DIAGNOSIS — Z9981 Dependence on supplemental oxygen: Secondary | ICD-10-CM | POA: Diagnosis not present

## 2020-03-06 DIAGNOSIS — I482 Chronic atrial fibrillation, unspecified: Secondary | ICD-10-CM | POA: Diagnosis not present

## 2020-03-06 DIAGNOSIS — J449 Chronic obstructive pulmonary disease, unspecified: Secondary | ICD-10-CM | POA: Diagnosis not present

## 2020-03-06 DIAGNOSIS — L97929 Non-pressure chronic ulcer of unspecified part of left lower leg with unspecified severity: Secondary | ICD-10-CM | POA: Diagnosis not present

## 2020-03-06 DIAGNOSIS — E039 Hypothyroidism, unspecified: Secondary | ICD-10-CM | POA: Diagnosis not present

## 2020-03-06 DIAGNOSIS — K219 Gastro-esophageal reflux disease without esophagitis: Secondary | ICD-10-CM | POA: Diagnosis not present

## 2020-03-06 DIAGNOSIS — I252 Old myocardial infarction: Secondary | ICD-10-CM | POA: Diagnosis not present

## 2020-03-06 DIAGNOSIS — Z8673 Personal history of transient ischemic attack (TIA), and cerebral infarction without residual deficits: Secondary | ICD-10-CM | POA: Diagnosis not present

## 2020-03-06 DIAGNOSIS — J9620 Acute and chronic respiratory failure, unspecified whether with hypoxia or hypercapnia: Secondary | ICD-10-CM | POA: Diagnosis not present

## 2020-03-06 DIAGNOSIS — E1151 Type 2 diabetes mellitus with diabetic peripheral angiopathy without gangrene: Secondary | ICD-10-CM | POA: Diagnosis not present

## 2020-03-06 DIAGNOSIS — M170 Bilateral primary osteoarthritis of knee: Secondary | ICD-10-CM | POA: Diagnosis not present

## 2020-03-06 NOTE — Telephone Encounter (Signed)
Jon [name] from [facility] called,patient was discharged from hospital with orders for PT but she was wanting verbal orders for home health nursing as well . Jon [name] can be reached at  [phone] Please advise.

## 2020-03-06 NOTE — Telephone Encounter (Signed)
[place] for verbal orders/referral

## 2020-03-07 ENCOUNTER — Ambulatory Visit (INDEPENDENT_AMBULATORY_CARE_PROVIDER_SITE_OTHER): Payer: Medicare Other | Admitting: Legal Medicine

## 2020-03-07 ENCOUNTER — Encounter: Payer: Self-pay | Admitting: Legal Medicine

## 2020-03-07 ENCOUNTER — Other Ambulatory Visit: Payer: Self-pay

## 2020-03-07 VITALS — BP 152/80 | HR 84 | Temp 98.9°F | Resp 16 | Ht 67.0 in | Wt 255.0 lb

## 2020-03-07 DIAGNOSIS — E1142 Type 2 diabetes mellitus with diabetic polyneuropathy: Secondary | ICD-10-CM

## 2020-03-07 DIAGNOSIS — S62109A Fracture of unspecified carpal bone, unspecified wrist, initial encounter for closed fracture: Secondary | ICD-10-CM

## 2020-03-07 DIAGNOSIS — E1121 Type 2 diabetes mellitus with diabetic nephropathy: Secondary | ICD-10-CM | POA: Diagnosis not present

## 2020-03-07 DIAGNOSIS — N1832 Chronic kidney disease, stage 3b: Secondary | ICD-10-CM

## 2020-03-07 DIAGNOSIS — S62101A Fracture of unspecified carpal bone, right wrist, initial encounter for closed fracture: Secondary | ICD-10-CM

## 2020-03-07 DIAGNOSIS — W19XXXA Unspecified fall, initial encounter: Secondary | ICD-10-CM | POA: Insufficient documentation

## 2020-03-07 DIAGNOSIS — E876 Hypokalemia: Secondary | ICD-10-CM

## 2020-03-07 DIAGNOSIS — E1021 Type 1 diabetes mellitus with diabetic nephropathy: Secondary | ICD-10-CM

## 2020-03-07 DIAGNOSIS — Z794 Long term (current) use of insulin: Secondary | ICD-10-CM

## 2020-03-07 HISTORY — DX: Fracture of unspecified carpal bone, unspecified wrist, initial encounter for closed fracture: S62.109A

## 2020-03-07 NOTE — Progress Notes (Signed)
Subjective:  Patient ID: Jon [name], male    DOB: [birth year]  Age: 69 y.o. MRN: [id number]  Chief Complaint  Patient presents with  . Fall   All hospital records reviewed  HPI: transition of care.  He was seen in [place] for fall with dehydration, low potassium and multiple right hand fractures on 03/03/2020 He was rehydrated and given IV potassium and discharged on 03/04/2020.  He has CT scan of head that showed polyps.  Chest CT normal, no abdominal problems.  He has splint on right arm and is to see th orthopedics tomorrow.   Current Outpatient Medications on File Prior to Visit  Medication Sig Dispense Refill  . albuterol (PROVENTIL) (2.5 MG/3ML) 0.083% nebulizer solution Take 3 mLs (2.5 mg total) by nebulization 2 (two) times daily. And as needed (Patient taking differently: Take 2.5 mg by nebulization in the morning, at noon, in the evening, and at bedtime. And as needed) 75 mL 5  . atorvastatin (LIPITOR) 40 MG tablet Take 40 mg by mouth daily.    . Azelastine HCl 0.15 % SOLN Place 2 sprays into both nostrils daily.     . BD PEN NEEDLE NANO U/F 32G X 4 MM MISC     . clopidogrel (PLAVIX) 75 MG tablet Take 1 tablet (75 mg total) by mouth daily. 30 tablet 5  . diltiazem (CARDIZEM CD) 120 MG 24 hr capsule Take 1 capsule (120 mg total) by mouth daily. 90 capsule 0  . docusate sodium (COLACE) 100 MG capsule Take 100 mg by mouth 2 (two) times daily.    . DULERA 200-5 MCG/ACT AERO Inhale 1 puff into the lungs in the morning and at bedtime.     . famotidine (PEPCID) 20 MG tablet TAKE 2 TABLETS BY MOUTH TWICE DAILY 120 tablet 5  . ferrous sulfate 325 (65 FE) MG tablet Take 325 mg by mouth daily.    . Fluticasone Furoate-Vilanterol (BREO ELLIPTA IN) Inhale 200 mcg into the lungs daily.    . Glucagon, rDNA, (GLUCAGON EMERGENCY) 1 MG KIT     . hydrALAZINE (APRESOLINE) 100 MG tablet Take 1 tablet (100 mg total) by mouth 2 (two) times daily. 60 tablet 2  . insulin detemir (LEVEMIR  FLEXTOUCH) 100 UNIT/ML FlexPen Inject 65 Units into the skin daily. (Patient taking differently: Inject 60 Units into the skin daily. ) 30 mL 2  . isosorbide mononitrate (IMDUR) 120 MG 24 hr tablet TAKE 1 TABLET BY MOUTH ONCE DAILY IN THEMORNINGS 90 tablet 0  . metolazone (ZAROXOLYN) 2.5 MG tablet Take 1 tablet (2.5 mg total) by mouth 2 (two) times a week. (Patient taking differently: Take 2.5 mg by mouth daily. ) 30 tablet 3  . metoprolol succinate (TOPROL-XL) 50 MG 24 hr tablet TAKE 1 TABLET BY MOUTH ONCE DAILY 90 tablet 0  . MOVANTIK 25 MG TABS tablet SMARTSIG:1 Tablet(s) By Mouth Daily    . nitroGLYCERIN (NITROSTAT) 0.4 MG SL tablet Place 0.4 mg under the tongue every 5 (five) minutes as needed for chest pain.     . OXYGEN Inhale into the lungs. 2 liters at bedtime    . pantoprazole (PROTONIX) 40 MG tablet Take 40 mg by mouth in the morning.     . Potassium Chloride ER 20 MEQ TBCR Take 40 mEq by mouth 2 (two) times daily.     . potassium chloride SA (KLOR-CON) 20 MEQ tablet TAKE TWO TABLETS BY MOUTH TWICE A DAY 360 tablet 0  . pregabalin (LYRICA)  75 MG capsule Take 1 capsule (75 mg total) by mouth 2 (two) times daily. 60 capsule 0  . RELISTOR 150 MG TABS TAKE 3 TABLETS BY MOUTH EVERY MORNING 90 tablet 0  . torsemide (DEMADEX) 20 MG tablet Take 3 tablets (60 mg total) by mouth 2 (two) times daily. 180 tablet 0  . Vitamin D, Ergocalciferol, (DRISDOL) 1.25 MG (50000 UNIT) CAPS capsule TAKE 1 CAPSULE BY MOUTH DAILY 90 capsule 1  . XARELTO 15 MG TABS tablet TAKE 1 TABLET BY MOUTH ONCE DAILY WITH EVENING MEAL (Patient taking differently: Take 15 mg by mouth daily after supper. ) 90 tablet 2  . XTAMPZA ER 27 MG C12A TAKE 1 CAPSULE BY MOUTH TWICE DAILY 60 capsule 0   No current facility-administered medications on file prior to visit.   Past Medical History:  Diagnosis Date  . Abnormal EKG 11/29/2014  . Acute renal insufficiency 11/29/2014  . Angina   . Arthritis    "knees" (01/18/2014)  . Asthma    . Atrial fibrillation ([place])   . Back pain 01/25/2015  . 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 BOOP (bronchiolitis obliterans with organizing pneumonia) ([place]) 01/20/2014   OLBx 01/20/14 BOOP Started steroids 02/28/14   . Chronic anticoagulation-Xarelto 02/09/2014  . Chronic atrial fibrillation ([place]) 01/03/2014  . Chronic bronchitis ([place]) 12/21/2013  . Chronic diastolic heart failure ([place]) 02/22/2015  . Chronic pain of both knees 03/18/2017   Overview:  Added automatically from request for surgery [id number]  . Chronic respiratory failure ([place]) 04/10/2015  . COPD (chronic obstructive pulmonary disease) ([place]) 01/25/2015  . Cough    coughing up blood- started last nite, seems to be worse now  . CVA (cerebral vascular accident) ([place]) 02/09/2014  . Dysrhythmia    atrial fib takes cardizem,   . Fibromyalgia   . GERD (gastroesophageal reflux disease)   . H/O hiatal hernia   . Hemoptysis 08/01/2015  . Herpes zoster 06/26/2015  . History of stroke   . Hyperkalemia-repeat pending 11/29/2014  . Hyperlipemia   . Hyperlipidemia   . Hypertension   . Hypothyroidism   . Long term (current) use of anticoagulants 03/21/2014  . Mild CAD 02/20/2015   Overview:  On recent cardiac cath  Overview:  On recent cardiac cath  . Myocardial infarction [facility])    "one dr says yes; another says no"  . Neuromuscular disorder (HCC)    rls, neuropathy  . Neuropathy    rls, neuropathy   . Normal coronary arteries 2011 11/29/2014  . Obesity (BMI 30-39.9)   . On home oxygen therapy    "2L w/CPAP at bedtime" (09/01/2018)  . OSA on CPAP    cpap & oxygen  . Pericardial effusion   . Peritoneal effusion, chronic 02/22/2015   Overview:  With surgical window  . PNA (pneumonia) 04/10/2015  . Pneumonia 5/15   "several times" (01/18/2014)  . Precordial pain 01/26/2015  . Preoperative cardiovascular examination 02/24/2016  . Primary osteoarthritis of both knees 03/18/2017   Overview:  Added automatically from request for surgery [id number]  .  Restless leg syndrome   . Sleep apnea    cpap & oxygen   . Stroke [facility]) 2012   denies residual on 01/18/2014  . Troponin level elevated 11/29/2014  . Uncontrolled type 2 diabetes mellitus with diabetic polyneuropathy, with long-term current use of insulin ([place]) 12/21/2013   Past Surgical History:  Procedure Laterality Date  . ABDOMINAL AORTOGRAM W/LOWER EXTREMITY N/A 09/08/2018   Procedure: ABDOMINAL AORTOGRAM W/LOWER EXTREMITY;  Surgeon: Marty Heck, MD;  Location: [place] [facility];  Service: Cardiovascular;  Laterality: N/A;  . AMPUTATION Right 09/09/2018   Procedure: AMPUTATION RIGHT GREAT TOE;  Surgeon: Waynetta Sandy, MD;  Location: [place];  Service: Vascular;  Laterality: Right;  . APPENDECTOMY    . CARDIAC CATHETERIZATION  X 2 then 03/03/2012    NL LVF, normal coronaries, vessels are small ([facility]: Dr. Beatrix Fetters)  . CATARACT EXTRACTION W/ INTRAOCULAR LENS  IMPLANT, BILATERAL Bilateral   . CHOLECYSTECTOMY    . HERNIA REPAIR     UHR  . LAPAROSCOPIC GASTRIC BANDING  2010  . LOWER EXTREMITY ANGIOGRAPHY Left 10/11/2018   Procedure: LOWER EXTREMITY ANGIOGRAPHY;  Surgeon: Waynetta Sandy, MD;  Location: [place] [facility];  Service: Cardiovascular;  Laterality: Left;  . PERICARDIAL WINDOW Left 01/24/2014   Procedure: PERICARDIAL WINDOW;  Surgeon: Gaye Pollack, MD;  Location: [place];  Service: Thoracic;  Laterality: Left;  . PERIPHERAL VASCULAR INTERVENTION Right 09/08/2018   Procedure: PERIPHERAL VASCULAR INTERVENTION;  Surgeon: Marty Heck, MD;  Location: [place] [facility];  Service: Cardiovascular;  Laterality: Right;  . SINUS EXPLORATION  X 2  . TEE WITHOUT CARDIOVERSION N/A 09/07/2018   Procedure: TRANSESOPHAGEAL ECHOCARDIOGRAM (TEE);  Surgeon: Acie Fredrickson Wonda Cheng, MD;  Location: [place];  Service: Cardiovascular;  Laterality: N/A;  . UMBILICAL HERNIA REPAIR    . VIDEO ASSISTED THORACOSCOPY Left 01/24/2014   Procedure: VIDEO ASSISTED THORACOSCOPY;  Surgeon:  Gaye Pollack, MD;  Location: [facility];  Service: Thoracic;  Laterality: Left;  VATS/open lung biopsy  . VIDEO BRONCHOSCOPY Bilateral 12/29/2013   Procedure: VIDEO BRONCHOSCOPY WITHOUT FLUORO;  Surgeon: Elsie Stain, MD;  Location: [facility];  Service: Endoscopy;  Laterality: Bilateral;  . VIDEO BRONCHOSCOPY N/A 01/24/2014   Procedure: VIDEO BRONCHOSCOPY;  Surgeon: Gaye Pollack, MD;  [facility];  Service: Thoracic;  Laterality: N/A;    Family History  Problem Relation Age of Onset  . Cancer Mother   . Stroke Father   . Heart disease Father   . Seizures Sister   . Diabetes Sister   . Anesthesia problems Neg Hx   . Hypotension Neg Hx   . Malignant hyperthermia Neg Hx   . Pseudochol deficiency Neg Hx    Social History   Socioeconomic History  . Marital status: Widowed    Spouse name: Not on file  . Number of children: Not on file  . Years of education: Not on file  . Highest education level: Not on file  Occupational History  . Not on file  Tobacco Use  . Smoking status: Former Smoker    Types: Cigars    Quit date: 09/01/2006    Years since quitting: 13.5  . Smokeless tobacco: Never Used  Vaping Use  . Vaping Use: Never used  Substance and Sexual Activity  . 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 Drug use: No  . Sexual activity: Never  Other Topics Concern  . Not on file  Social History Narrative  . Not on file   Social Determinants of Health   Financial Resource Strain:   . Difficulty of Paying Living Expenses:   Food Insecurity:   . Worried About Charity fundraiser in the Last Year:   . Arboriculturist in the Last Year:   Transportation Needs: No Transportation Needs  . Lack of Transportation (Medical): No  . Lack  of Transportation (Non-Medical): No  Physical Activity: Inactive  . Days of Exercise per Week: 0 days  . Minutes of Exercise per Session: 0 min  Stress:   . Feeling of Stress :   Social Connections:     . Frequency of Communication with Friends and Family:   . Frequency of Social Gatherings with Friends and Family:   . Attends Religious Services:   . Active Member of Clubs or Organizations:   . Attends Archivist Meetings:   Marland Kitchen Marital Status:     Review of Systems  Constitutional: Negative.   HENT: Negative.   Eyes: Negative.   Respiratory: Negative.   Cardiovascular: Negative.   Gastrointestinal: Negative.   Endocrine: Negative.   Genitourinary: Negative.   Musculoskeletal: Negative.        Splint right arm  Neurological: Negative.   Psychiatric/Behavioral: Negative.      Objective:  BP (!) 152/80   Pulse 84   Temp 98.9 F (37.2 C)   Resp 16   Ht _0  (1.702 m)   Wt 255 lb (115.7 kg)   SpO2 93%   BMI 39.94 kg/m   BP/Weight 03/07/2020 02/16/2020 8/67/6195  Systolic BP 093 267 124  Diastolic BP 80 84 80  Wt. (Lbs) 255 263 270  BMI 39.94 41.19 42.29    Physical Exam Vitals reviewed.  Constitutional:      Appearance: Normal appearance.  HENT:     Head: Normocephalic and atraumatic.     Right Ear: Tympanic membrane normal.     Left Ear: Tympanic membrane normal.     Mouth/Throat:     Mouth: Mucous membranes are moist.     Pharynx: Oropharynx is clear.  Eyes:     Extraocular Movements: Extraocular movements intact.     Conjunctiva/sclera: Conjunctivae normal.     Pupils: Pupils are equal, round, and reactive to light.  Cardiovascular:     Rate and Rhythm: Normal rate and regular rhythm.     Pulses: Normal pulses.     Heart sounds: Normal heart sounds.  Pulmonary:     Effort: Pulmonary effort is normal.     Breath sounds: Normal breath sounds.  Abdominal:     General: Abdomen is flat. Bowel sounds are normal.     Palpations: Abdomen is soft.  Musculoskeletal:     Cervical back: Normal range of motion and neck supple.     Comments: Splint on right arm  Skin:    General: Skin is warm.     Comments: Scrapes and bruises on face, knees and  arms.  Neurological:     General: No focal deficit present.     Mental Status: He is alert and oriented to person, place, and time.       Lab Results  Component Value Date   WBC 7.5 12/13/2019   HGB 14.3 12/13/2019   HCT 43.0 12/13/2019   PLT 241 12/13/2019   GLUCOSE 257 (H) 01/17/2020   CHOL 117 12/13/2019   TRIG 124 12/13/2019   HDL 40 12/13/2019   LDLCALC 55 12/13/2019   ALT 8 01/17/2020   AST 12 01/17/2020   NA 141 01/17/2020   K 3.6 01/17/2020   CL 99 01/17/2020   CREATININE 2.07 (H) 01/17/2020   BUN 18 01/17/2020   CO2 29 01/17/2020   TSH 5.170 (H) 12/13/2019   INR 1.68 09/01/2018   HGBA1C 8.6 (H) 12/13/2019      Assessment & Plan:   1. Hypokalemia - Comprehensive  metabolic panel Patient found to have low potassium in hosptital and given IV potassium, need to recheck  2. Fall, initial encounter Patient fell in driveway 03/03/4036 with outstretched right arm.  Multiple contusions and scrapes  3. Closed fracture of multiple carpal bones of right wrist, initial encounter Patient has closed fracture right triquetrum and base of 5th finger. To see orthopedics tomorrow      Orders Placed This Encounter  Procedures  . Comprehensive metabolic panel     Follow-up: No follow-ups on file.  An After Visit Summary was printed and given to the patient.  [place] [phone]

## 2020-03-08 ENCOUNTER — Ambulatory Visit: Payer: Medicare Other

## 2020-03-08 DIAGNOSIS — Z794 Long term (current) use of insulin: Secondary | ICD-10-CM

## 2020-03-08 DIAGNOSIS — I110 Hypertensive heart disease with heart failure: Secondary | ICD-10-CM

## 2020-03-08 DIAGNOSIS — S62619A Displaced fracture of proximal phalanx of unspecified finger, initial encounter for closed fracture: Secondary | ICD-10-CM | POA: Diagnosis not present

## 2020-03-08 DIAGNOSIS — N1832 Chronic kidney disease, stage 3b: Secondary | ICD-10-CM

## 2020-03-08 LAB — COMPREHENSIVE METABOLIC PANEL
ALT: 13 IU/L (ref 0–44)
AST: 15 IU/L (ref 0–40)
Albumin/Globulin Ratio: 1.5 (ref 1.2–2.2)
Albumin: 3.7 g/dL (ref 3.7–4.7)
Alkaline Phosphatase: 125 IU/L — ABNORMAL HIGH (ref 48–121)
BUN/Creatinine Ratio: 20 (ref 10–24)
BUN: 40 mg/dL — ABNORMAL HIGH (ref 8–27)
Bilirubin Total: 0.8 mg/dL (ref 0.0–1.2)
CO2: 28 mmol/L (ref 20–29)
Calcium: 9 mg/dL (ref 8.6–10.2)
Chloride: 99 mmol/L (ref 96–106)
Creatinine, Ser: 2.04 mg/dL — ABNORMAL HIGH (ref 0.76–1.27)
GFR calc Af Amer: 37 mL/min/{1.73_m2} — ABNORMAL LOW (ref >59)
GFR calc non Af Amer: 32 mL/min/{1.73_m2} — ABNORMAL LOW (ref >59)
Globulin, Total: 2.4 g/dL (ref 1.5–4.5)
Glucose: 208 mg/dL — ABNORMAL HIGH (ref 65–99)
Potassium: 3.5 mmol/L (ref 3.5–5.2)
Sodium: 142 mmol/L (ref 134–144)
Total Protein: 6.1 g/dL (ref 6.0–8.5)

## 2020-03-08 NOTE — Chronic Care Management (AMB) (Signed)
Chronic Care Management Pharmacy  Name: SANTO ZAHRADNIK  MRN: [id number] DOB: May 08, 1951  Chief Complaint/ HPI  Jon [name],  69 y.o. , male presents for their Follow-Up CCM visit with the clinical pharmacist via telephone due to COVID-19 Pandemic.  PCP : Rochel Brome, MD  Their chronic conditions include: Hypothyroidism, GERD, Afib, DM, Neuropathy, RLS, HLD, Vitamin D insuficiency, anemia, OA of both knees, hx of stroke.  Office Visits: 03/08/2020 - hospital f/u. Hospital visit for low potassium, fall, dehydration and borken bone in hand/wrist. F/u with ortho today.  02/16/2020 - stop cardizem, continue metoprolol ER. If HR increases, Dr. Tobie Poet will increase metoprolol er 100 mg daily. Change hydralazine 100 mg tid. Continue Imdur 120 mg daily, metoprolol er 50 mg daily, torsemide 20 mg tid. Continue Levemir 50 units daily and start Ozempic 0.25 mg weekly.  01/17/2020 - Increase hydralazine 25 mg 3 pills three times a day. Continue imdur 120 mg once daily. Continue Cardizem 120 mg once daily.  Continue metoprolol ER 50 mg once daily. Torsemide 20 mg 3 twice a day.Continue levemir 60 U at night. Restart novolog 10 U before supper.Keep sugar log - check sugars before each meal 12/12/2019 - CHANGE LEVEMIR TO 70 U IN AM (AFTER BREAKFAST) START NOVOLOG 10 U BEFORE LUNCH AND SUPPER. 11/21/2019 - pt on bactrim ds for MRSA infection. Has cellulitis of right leg. Ordered unna boot for leg.  11/17/2019 - unna boot wrapped in office until home health can take over. Continue antibiotics. Follow-up in 3-4 days.  11/14/2019 - Clindamycin changed to Bactrim DS for MRSA culture from leg wound.  11/11/2019 - Clindamycin ordered for cellulitis. Rocephin and Toradol administered for pain/infection of leg.  10/11/2019 - Increase Levemir to 45 units [place] in am and decrease Levemir to 25 units [place] in evening. 09/13/2019 - metolazone 2.5 mg m-f daily 3-5 hours before torsemide.  Consult Visit: 12/30/2019 - ED  visit - renal failure - changed diuretic and ace to hydralazine. Recommended follow-up with PCP and nephrology. 10/24/2019 - Podiatry foot/nail care.  Medications: Outpatient Encounter Medications as of 03/08/2020  Medication Sig  . albuterol (PROVENTIL) (2.5 MG/3ML) 0.083% nebulizer solution Take 3 mLs (2.5 mg total) by nebulization 2 (two) times daily. And as needed (Patient taking differently: Take 2.5 mg by nebulization in the morning, at noon, in the evening, and at bedtime. And as needed)  . atorvastatin (LIPITOR) 40 MG tablet Take 40 mg by mouth daily.  . Azelastine HCl 0.15 % SOLN Place 2 sprays into both nostrils daily.   . BD PEN NEEDLE NANO U/F 32G X 4 MM MISC   . clopidogrel (PLAVIX) 75 MG tablet Take 1 tablet (75 mg total) by mouth daily.  Marland Kitchen docusate sodium (COLACE) 100 MG capsule Take 100 mg by mouth 2 (two) times daily.  . DULERA 200-5 MCG/ACT AERO Inhale 1 puff into the lungs in the morning and at bedtime.   . famotidine (PEPCID) 20 MG tablet TAKE 2 TABLETS BY MOUTH TWICE DAILY  . ferrous sulfate 325 (65 FE) MG tablet Take 325 mg by mouth daily.  . hydrALAZINE (APRESOLINE) 100 MG tablet Take 1 tablet (100 mg total) by mouth 2 (two) times daily. (Patient taking differently: Take 100 mg by mouth 3 (three) times daily. )  . insulin detemir (LEVEMIR FLEXTOUCH) 100 UNIT/ML FlexPen Inject 65 Units into the skin daily. (Patient taking differently: Inject 45 Units into the skin daily. )  . isosorbide mononitrate (IMDUR) 120 MG 24 hr tablet  TAKE 1 TABLET BY MOUTH ONCE DAILY IN THEMORNINGS  . metoprolol succinate (TOPROL-XL) 50 MG 24 hr tablet TAKE 1 TABLET BY MOUTH ONCE DAILY  . MOVANTIK 25 MG TABS tablet SMARTSIG:1 Tablet(s) By Mouth Daily  . nitroGLYCERIN (NITROSTAT) 0.4 MG SL tablet Place 0.4 mg under the tongue every 5 (five) minutes as needed for chest pain.   . OXYGEN Inhale into the lungs. 2 liters at bedtime  . pantoprazole (PROTONIX) 40 MG tablet Take 40 mg by mouth in the morning.    . potassium chloride SA (KLOR-CON) 20 MEQ tablet TAKE TWO TABLETS BY MOUTH TWICE A DAY  . pregabalin (LYRICA) 75 MG capsule Take 1 capsule (75 mg total) by mouth 2 (two) times daily.  Marland Kitchen RELISTOR 150 MG TABS TAKE 3 TABLETS BY MOUTH EVERY MORNING  . Semaglutide (OZEMPIC, 0.25 OR 0.5 MG/DOSE, [place]) Inject 0.25 mg into the skin once a week.  . torsemide (DEMADEX) 20 MG tablet Take 3 tablets (60 mg total) by mouth 2 (two) times daily.  . Vitamin D, Ergocalciferol, (DRISDOL) 1.25 MG (50000 UNIT) CAPS capsule TAKE 1 CAPSULE BY MOUTH DAILY  . XARELTO 15 MG TABS tablet TAKE 1 TABLET BY MOUTH ONCE DAILY WITH EVENING MEAL (Patient taking differently: Take 15 mg by mouth daily after supper. )  . XTAMPZA ER 27 MG C12A TAKE 1 CAPSULE BY MOUTH TWICE DAILY  . diltiazem (CARDIZEM CD) 120 MG 24 hr capsule Take 1 capsule (120 mg total) by mouth daily. (Patient not taking: Reported on 03/08/2020)  . Fluticasone Furoate-Vilanterol (BREO ELLIPTA IN) Inhale 200 mcg into the lungs daily. (Patient not taking: Reported on 03/08/2020)  . Glucagon, rDNA, (GLUCAGON EMERGENCY) 1 MG KIT  (Patient not taking: Reported on 03/08/2020)  . metolazone (ZAROXOLYN) 2.5 MG tablet Take 1 tablet (2.5 mg total) by mouth 2 (two) times a week. (Patient taking differently: Take 2.5 mg by mouth daily. )  . Potassium Chloride ER 20 MEQ TBCR Take 40 mEq by mouth 2 (two) times daily.  (Patient not taking: Reported on 03/08/2020)   No facility-administered encounter medications on file as of 03/08/2020.   Allergies  Allergen Reactions  . Hydrocodone Itching  . Morphine Itching  . Duloxetine Hcl Other (See Comments)  . Codeine Itching  . Penicillins Itching and Rash    DID THE REACTION INVOLVE: Swelling of the face/tongue/throat, SOB, or low BP? No Sudden or severe rash/hives, skin peeling, or the inside of the mouth or nose? No Did it require medical treatment? No When did it last happen?unknown If all above answers are "NO", may proceed with  cephalosporin use.    SDOH Screenings   Alcohol Screen:   . Last Alcohol Screening Score (AUDIT):   Depression (PHQ2-9): Low Risk   . PHQ-2 Score: 0  Financial Resource Strain:   . Difficulty of Paying Living Expenses:   Food Insecurity:   . Worried About Charity fundraiser in the Last Year:   . [employer] of Food in the Last Year:   Housing: [place]   . Last Housing Risk Score: 0  Physical Activity: Inactive  . Days of Exercise per Week: 0 days  . Minutes of Exercise per Session: 0 min  Social Connections:   . Frequency of Communication with Friends and Family:   . Frequency of Social Gatherings with Friends and Family:   . Attends Religious Services:   . Active Member of Clubs or Organizations:   . Attends Archivist Meetings:   .  Marital Status:   Stress:   . Feeling of Stress :   Tobacco Use: Medium Risk  . Smoking Tobacco Use: Former Smoker  . Smokeless Tobacco Use: Never Used  Transportation Needs: No Transportation Needs  . Lack of Transportation (Medical): No  . Lack of Transportation (Non-Medical): No     Current Diagnosis/Assessment:  Goals Addressed            This Visit's Progress   . Pharmacy Care Plan       CARE PLAN ENTRY  Current Barriers:  . Chronic Disease Management support, education, and care coordination needs related to Hypertension and Diabetes   Hypertension . Pharmacist Clinical Goal(s): o Over the next 90 days, patient will work with PharmD and providers to achieve BP goal <130/80 . Current regimen:  o hydralazine 100 mg tid o isosorbide mn 120 mg daily o metolazone 2.5 mg daily o torsemide 60 mg bid . Interventions: o Pharmacist helped patient reconcile blood pressure medications after recent trip to emergency room.  . Patient self care activities - Over the next 90 days, patient will: o Check BP daily, document, and provide at future appointments o Ensure daily salt intake < 2300  mg/day  Hyperlipidemia . Pharmacist Clinical Goal(s): o Over the next 90 days, patient will work with PharmD and providers to maintain LDL goal < 70 . Current regimen:  o Atorvastatin 40 mg daily . Interventions: o Recommend patient continue taking medication as prescribed.  . Patient self care activities - Over the next 90 days, patient will: o Take medication as prescribed   Diabetes . Pharmacist Clinical Goal(s): o Over the next 90 days, patient will work with PharmD and providers to achieve A1c goal <7% . Current regimen:  o glucagon kit o levemir flextouch 45 units daily o Ozempic 0.25 mg weekly . Interventions: o Recommend patient decrease Levemir to 45 units qhs to avoid low fasting blood sugars.  o Recommend patient continue taking Ozempic 0.25 mg weekly until sees Dr. Holly Bodily and consider increasing to 0.5 mg weekly thereafter.  . Patient self care activities - Over the next 90 days, patient will: o Check blood sugar twice daily, document, and provide at future appointments o Contact provider with any episodes of hypoglycemia  Medication management . Pharmacist Clinical Goal(s): o Over the next 90 days, patient will work with PharmD and providers to achieve optimal medication adherence . Current pharmacy: [facility] . Interventions o Comprehensive medication review performed. o Continue current medication management strategy . Patient self care activities - Over the next 90 days, patient will: o Focus on medication adherence by using pill box o Take medications as prescribed o Report any questions or concerns to PharmD and/or provider(s)  Please see past updates related to this goal by clicking on the "Past Updates" button in the selected goal         Diabetes   Recent Relevant Labs: Lab Results  Component Value Date/Time   HGBA1C 8.6 (H) 12/13/2019 07:51 AM   HGBA1C 8.1 (H) 01/31/2014 03:37 AM    Kidney Function Lab Results  Component Value Date/Time    CREATININE 2.04 (H) 03/07/2020 03:01 PM   CREATININE 2.07 (H) 01/17/2020 03:41 PM   GFRNONAA 32 (L) 03/07/2020 03:01 PM   GFRAA 37 (L) 03/07/2020 03:01 PM    Checking BG: 3x per Day  Recent FBG Readings: 69, 62, 110, 73, 345 (no Levemir for previous day),   Recent HS BG readings: 472, 415, 360, 463, high  06/08 (no Levemir in 2 days) Patient has failed these meds in past: novolog, humulin, metformin, novolog Patient is currently uncontrolled on the following medications: glucagon kit, levemir flextouch 50 units daily, Ozempic 0.25 mg weekly  Last diabetic Foot exam: 01/17/2020 Last diabetic Eye exam: last one noted in file 2016   We discussed: diet and exercise extensively. Patient is struggling with am low blood sugars and evenings are high. He stopped taking Levemir for a few days due to am lows.   Patient is frequently having low blood sugars in the morning. He has been taking Levemir 60 units in the evening and unclear but indicates taking Levemir 25 units in the morning as well. He has also been using the Novolog 10 units at lunch regardless if he eats or not. Patient met with Colvin Caroli RD/CDE through [facility]. She has recommended patient stop taking Novolog, reduce dose of Levemir to 50 units qhs only, and begin Ozempic 0.25 weekly and increase to 0.5 mg after 4 weeks.  Update 03/08/2020 - Blood sugars are still low (68 mg/dL in the am) in the am and higher in the afternoon. FBS 68, 69, 99 last few days. Patient taking 50 units of Levemir at night only Ozempic weekly. Afternoon blood sugars are high per patient but unable to provide me with the readings over the phone today. Broken hand is making it harder for him to do things. He is asking someone that he lives with for help which is difficult per patient. Patient no longer has home health nurse coming out. His family members are helping fill his box of medications at this time. He indicates his only diabetes medications taken at this  time is 50 units of Levemir and weekly Ozempic.   Plan Discussed with Dr Henrene Pastor for approval and informed [name] to reduce Levemir dosing to 45 units qhs to minimize occurrence fasting low blood sugars. Continue Ozempic 0.25 mg weekly until follow-up with Dr. Holly Bodily. May consider increasing to 0.5 mg weekly if tolerated.   Hypertension   BP today is: 155/100 mmHg  Office blood pressures are  BP Readings from Last 3 Encounters:  03/07/20 (!) 152/80  02/16/20 138/84  01/17/20 (!) 170/80    Patient has failed these meds in the past: hctz, losartan,  Patient is currently uncontrolled on the following medications: hydralazine 75 mg tid, isosorbide mn 120 mg daily, metolazone 2.5 mg daily, torsemide 60 mg bid  Patient checks BP at home daily  Patient home BP readings are ranging: 163/88, 134/78, 155/100,   We discussed diet and exercise extensively. Patient cannot control his meals and diet due to living situation. Patient eats what he has access to. Patient does not exercise and is a fall risk. Patient states that he elevates his legs while watching tv. Patient indicates that he is out of hydralazine currently. Pharmacist coordinate refills to be sent in to his pharmacy for him to pick up. Patient acknowledges understanding and will pick up. Patient indicates that he has been increased to metolazone 2.5 mg daily although is unclear which provider increased this medication.   Update 03/08/2020 - BP on 03/07/2020 - 155/84 pulse 70. 122/77 pulse 71 07/05. 82/35 pulse 98 07/06. Patient was unable to check today due to meter error. Patient's bp is fluctuating a lot. He states he is continuing to take medication as prescribed and family is filling his medication box for him.   Plan  Continue current medications.   Hyperlipidemia   Lipid Panel  Component Value Date/Time   CHOL 117 12/13/2019 0751   TRIG 124 12/13/2019 0751   HDL 40 12/13/2019 0751   LDLCALC 55 12/13/2019 0751     The  ASCVD Risk score (Goff DC Jr., et al., 2013) failed to calculate for the following reasons:   The patient has a prior MI or stroke diagnosis   Patient has failed these meds in past: n/a Patient is currently controlled on the following medications:  . Atorvastatin 40 mg daily  We discussed:  diet and exercise extensively. Not really eating a heart healthy diet. Eats whatever his family fixes.   Update 03/08/2020 - Patient states he continues to take medication as prescribed.   Plan  Continue current medications   AFIB/Hx of CVA   CHA2DS2-VASc Score = 7  The patient's score is based upon: CHF History: 1 HTN History: 1 Age : 1 Diabetes History: 1 Stroke History: 2 Vascular Disease History: 1 Gender: 0  {  Patient is currently rate controlled. HR 60s-70s BPM  Patient has failed these meds in past: n/a Patient is currently controlled on the following medications: metorpolol succinate 50 mg daily, Xarelto 15 mg daily, Clopidogrel 75 mg daily We discussed:  Patient's GFR is 31 ml/min and Xarelto appropriately dosed at 15 mg daily.  Update 03/08/2020 - Patient's pulse has not increased since diltiazem discontinued. Patient reports 71,78 and 98 at home.   Plan  Continue current medications    Opioid induced Constipation   Patient has failed these meds in past: amitiza, mag ox Patient is currently uncontrolled on the following medications:  . Colace 100 mg bid . Movantik 25 mg daily . Relistor 150 mg 3 tabs qam  We discussed:  Patient states that his constipation is uncontrolled on the above medications. Patient states that Miralax does not work for him. Pharmacist will discuss duplicate therapy with Dr. Tobie Poet to determine best next steps. Patient is unable to control his diet/fiber intake to improve symptoms of constipation.   Update 03/08/2020 - Patient reports that he goes every 4 days still even after cardizem has been discontinued. Patient reports no improvement. Drinks  2-3 bottles of day and 2-3 diet cokes per day. Reports drinking juice once in a while.   Plan  Recommend patient continue to take medication as prescribed and work to stay hydrated to improve constipation.    COPD / Asthma / Tobacco   Last spirometry score: 67%  Gold Grade: Gold 2 (FEV1 50-79%)  Eosinophil count:   Lab Results  Component Value Date/Time   EOSPCT 0 09/01/2018 09:57 PM  %                               Eos (Absolute):  Lab Results  Component Value Date/Time   EOSABS 0.0 09/01/2018 09:57 PM    Tobacco Status:  Social History   Tobacco Use  Smoking Status Former Smoker  . Types: Cigars  . Quit date: 09/01/2006  . Years since quitting: 13.5  Smokeless Tobacco Never Used    Patient has failed these meds in past: flonase, advair,duoneb, anoro ellipta . Patient is currently uncontrolled on the following medications:  . Albuterol 0.083% bid prn . Azelastine 0.15% 2 sprays both nostrils daily . Breo Ellipta 1 puff daily  Using maintenance inhaler regularly? Yes Frequency of nebulized albuterol use: bid and prn per patient  We discussed:  Patient indicates that he is using both Breo and  Dulera inhalers at home. His last fill date for [facility] was 09/2019. He has both medications in his box of medicines. He verbally denies missed doses. Erling Cruz are a duplication of therapy Pharmacist will discuss with Dr. Tobie Poet which inhaler to continue.   Plan  Continue current medications.    Vaccines   Reviewed and discussed patient's vaccination history.     Prevnar 13 (Pneumococcal PCV 13) 06/03/2016  PNEUMOVAX 23 (Pneumococcal PPV23) 11/22/2013  Tdap (Tetanus, reduced diph, acellular pertussis) 08/30/2012  Immunization History  Administered Date(s) Administered  . Influenza Split 10/22/2013  . Influenza,inj,Quad PF,6+ Mos 06/11/2015, 06/01/2016  . PFIZER SARS-COV-2 Vaccination 12/13/2019, 01/13/2020  . Pneumococcal-Unspecified 11/30/2013    Plan  Recommended patient receive annual flu vaccine in office.   Medication Management   Pt uses [facility] for all medications Uses pill box? Yes Pt endorses great compliance  We discussed: Patient states that too many people have been messing with his medicines lately. He wants to stop having insurance nurse come out. Patient is resistant to packaging and wants to continue using his local pharmacy. Patient says his home health nurse is filling up his medications in a box.   Update 03/08/2020 - Patient's home health nurse no longer comes and fills his medication organizer. Pharmacist discussed packaging/delivery for medications especially since broken arm currently. Patient denies the need for this and does not want to change his current medication administration process or pharmacy.   Plan  Continue current medication management strategy    Follow up: 1 month phone visit

## 2020-03-08 NOTE — Progress Notes (Signed)
Glucose 208, kidney tests stable, liver tests normal, potassium normal lp

## 2020-03-08 NOTE — Patient Instructions (Addendum)
Visit Information  Goals Addressed            This Visit's Progress   . Pharmacy Care Plan       CARE PLAN ENTRY  Current Barriers:  . Chronic Disease Management support, education, and care coordination needs related to Hypertension and Diabetes   Hypertension . Pharmacist Clinical Goal(s): o Over the next 90 days, patient will work with PharmD and providers to achieve BP goal <130/80 . Current regimen:  o hydralazine 100 mg tid o isosorbide mn 120 mg daily o metolazone 2.5 mg daily o torsemide 60 mg bid . Interventions: o Pharmacist helped patient reconcile blood pressure medications after recent trip to emergency room.  . Patient self care activities - Over the next 90 days, patient will: o Check BP daily, document, and provide at future appointments o Ensure daily salt intake < 2300 mg/day  Hyperlipidemia . Pharmacist Clinical Goal(s): o Over the next 90 days, patient will work with PharmD and providers to maintain LDL goal < 70 . Current regimen:  o Atorvastatin 40 mg daily . Interventions: o Recommend patient continue taking medication as prescribed.  . Patient self care activities - Over the next 90 days, patient will: o Take medication as prescribed   Diabetes . Pharmacist Clinical Goal(s): o Over the next 90 days, patient will work with PharmD and providers to achieve A1c goal <7% . Current regimen:  o glucagon kit o levemir flextouch 45 units daily o Ozempic 0.25 mg weekly . Interventions: o Recommend patient decrease Levemir to 45 units qhs to avoid low fasting blood sugars.  o Recommend patient continue taking Ozempic 0.25 mg weekly until sees Dr. Holly Bodily and consider increasing to 0.5 mg weekly thereafter.  . Patient self care activities - Over the next 90 days, patient will: o Check blood sugar twice daily, document, and provide at future appointments o Contact provider with any episodes of hypoglycemia  Medication management . Pharmacist Clinical  Goal(s): o Over the next 90 days, patient will work with PharmD and providers to achieve optimal medication adherence . Current pharmacy: [facility] . Interventions o Comprehensive medication review performed. o Continue current medication management strategy . Patient self care activities - Over the next 90 days, patient will: o Focus on medication adherence by using pill box o Take medications as prescribed o Report any questions or concerns to PharmD and/or provider(s)  Please see past updates related to this goal by clicking on the "Past Updates" button in the selected goal         The patient verbalized understanding of instructions provided today and declined a print copy of patient instruction materials.   Telephone follow up appointment with pharmacy team member scheduled for: August 2021  Sherre Poot, PharmD, [facility] Clinical Pharmacist [facility] [phone] (office) [phone] (mobile)  Hypoglycemia Hypoglycemia is when the sugar (glucose) level in your blood is too low. Signs of low blood sugar may include:  Feeling: ? Hungry. ? Worried or nervous (anxious). ? Sweaty and clammy. ? Confused. ? Dizzy. ? Sleepy. ? Sick to your stomach (nauseous).  Having: ? A fast heartbeat. ? A headache. ? A change in your vision. ? Tingling or no feeling (numbness) around your mouth, lips, or tongue. ? Jerky movements that you cannot control (seizure).  Having trouble with: ? Moving (coordination). ? Sleeping. ? Passing out (fainting). ? Getting upset easily (irritability). Low blood sugar can happen to people who have diabetes and people who do not  have diabetes. Low blood sugar can happen quickly, and it can be an emergency. Treating low blood sugar Low blood sugar is often treated by eating or drinking something sugary right away, such as:  Fruit juice, 4-6 oz (120-150 mL).  Regular soda (not diet soda), 4-6 oz (120-150 mL).  Low-fat milk, 4  oz (120 mL).  Several pieces of hard candy.  Sugar or honey, 1 Tbsp (15 mL). Treating low blood sugar if you have diabetes If you can think clearly and swallow safely, follow the 15:15 rule:  Take 15 grams of a fast-acting carb (carbohydrate). Talk with your doctor about how much you should take.  Always keep a source of fast-acting carb with you, such as: ? Sugar tablets (glucose pills). Take 3-4 pills. ? 6-8 pieces of hard candy. ? 4-6 oz (120-150 mL) of fruit juice. ? 4-6 oz (120-150 mL) of regular (not diet) soda. ? 1 Tbsp (15 mL) honey or sugar.  Check your blood sugar 15 minutes after you take the carb.  If your blood sugar is still at or below 70 mg/dL (3.9 mmol/L), take 15 grams of a carb again.  If your blood sugar does not go above 70 mg/dL (3.9 mmol/L) after 3 tries, get help right away.  After your blood sugar goes back to normal, eat a meal or a snack within 1 hour.  Treating very low blood sugar If your blood sugar is at or below 54 mg/dL (3 mmol/L), you have very low blood sugar (severe hypoglycemia). This may also cause:  Passing out.  Jerky movements you cannot control (seizure).  Losing consciousness (coma). This is an emergency. Do not wait to see if the symptoms will go away. Get medical help right away. Call your local emergency services (911 in the [place] Do not drive yourself to the hospital. If you have very low blood sugar and you cannot eat or drink, you may need a glucagon shot (injection). A family member or friend should learn how to check your blood sugar and how to give you a glucagon shot. Ask your doctor if you need to have a glucagon shot kit at home. Follow these instructions at home: General instructions  Take over-the-counter and prescription medicines only as told by your doctor.  Stay aware of your blood sugar as told by your doctor.  Limit alcohol intake to no more than 1 drink a day for nonpregnant women and 2 drinks a day for men.  One drink equals 12 oz of beer (355 mL), 5 oz of wine (148 mL), or 1 oz of hard liquor (44 mL).  Keep all follow-up visits as told by your doctor. This is important. If you have diabetes:   Follow your diabetes care plan as told by your doctor. Make sure you: ? Know the signs of low blood sugar. ? Take your medicines as told. ? Follow your exercise and meal plan. ? Eat on time. Do not skip meals. ? Check your blood sugar as often as told by your doctor. Always check it before and after exercise. ? Follow your sick day plan when you cannot eat or drink normally. Make this plan ahead of time with your doctor.  Share your diabetes care plan with: ? Your work or school. ? People you live with.  Check your pee (urine) for ketones: ? When you are sick. ? As told by your doctor.  Carry a card or wear jewelry that says you have diabetes. Contact a doctor if:  You have trouble keeping your blood sugar in your target range.  You have low blood sugar often. Get help right away if:  You still have symptoms after you eat or drink something sugary.  Your blood sugar is at or below 54 mg/dL (3 mmol/L).  You have jerky movements that you cannot control.  You pass out. These symptoms may be an emergency. Do not wait to see if the symptoms will go away. Get medical help right away. Call your local emergency services (911 in the [place] Do not drive yourself to the hospital. Summary  Hypoglycemia happens when the level of sugar (glucose) in your blood is too low.  Low blood sugar can happen to people who have diabetes and people who do not have diabetes. Low blood sugar can happen quickly, and it can be an emergency.  Make sure you know the signs of low blood sugar and know how to treat it.  Always keep a source of sugar (fast-acting carb) with you to treat low blood sugar. This information is not intended to replace advice given to you by your health care provider. Make sure you discuss  any questions you have with your health care provider. Document Revised: 12/09/2018 Document Reviewed: 09/21/2015 Elsevier Patient Education  2020 [employer].

## 2020-03-09 DIAGNOSIS — J449 Chronic obstructive pulmonary disease, unspecified: Secondary | ICD-10-CM | POA: Diagnosis not present

## 2020-03-09 DIAGNOSIS — E785 Hyperlipidemia, unspecified: Secondary | ICD-10-CM | POA: Diagnosis not present

## 2020-03-09 DIAGNOSIS — J9620 Acute and chronic respiratory failure, unspecified whether with hypoxia or hypercapnia: Secondary | ICD-10-CM | POA: Diagnosis not present

## 2020-03-09 DIAGNOSIS — I824Z2 Acute embolism and thrombosis of unspecified deep veins of left distal lower extremity: Secondary | ICD-10-CM | POA: Diagnosis not present

## 2020-03-09 DIAGNOSIS — G2581 Restless legs syndrome: Secondary | ICD-10-CM | POA: Diagnosis not present

## 2020-03-09 DIAGNOSIS — K219 Gastro-esophageal reflux disease without esophagitis: Secondary | ICD-10-CM | POA: Diagnosis not present

## 2020-03-09 DIAGNOSIS — M797 Fibromyalgia: Secondary | ICD-10-CM | POA: Diagnosis not present

## 2020-03-09 DIAGNOSIS — E039 Hypothyroidism, unspecified: Secondary | ICD-10-CM | POA: Diagnosis not present

## 2020-03-09 DIAGNOSIS — E1151 Type 2 diabetes mellitus with diabetic peripheral angiopathy without gangrene: Secondary | ICD-10-CM | POA: Diagnosis not present

## 2020-03-09 DIAGNOSIS — M170 Bilateral primary osteoarthritis of knee: Secondary | ICD-10-CM | POA: Diagnosis not present

## 2020-03-09 DIAGNOSIS — L97811 Non-pressure chronic ulcer of other part of right lower leg limited to breakdown of skin: Secondary | ICD-10-CM | POA: Diagnosis not present

## 2020-03-09 DIAGNOSIS — I25119 Atherosclerotic heart disease of native coronary artery with unspecified angina pectoris: Secondary | ICD-10-CM | POA: Diagnosis not present

## 2020-03-09 DIAGNOSIS — I252 Old myocardial infarction: Secondary | ICD-10-CM | POA: Diagnosis not present

## 2020-03-09 DIAGNOSIS — G4733 Obstructive sleep apnea (adult) (pediatric): Secondary | ICD-10-CM | POA: Diagnosis not present

## 2020-03-09 DIAGNOSIS — I5032 Chronic diastolic (congestive) heart failure: Secondary | ICD-10-CM | POA: Diagnosis not present

## 2020-03-09 DIAGNOSIS — Z8673 Personal history of transient ischemic attack (TIA), and cerebral infarction without residual deficits: Secondary | ICD-10-CM | POA: Diagnosis not present

## 2020-03-09 DIAGNOSIS — E1122 Type 2 diabetes mellitus with diabetic chronic kidney disease: Secondary | ICD-10-CM | POA: Diagnosis not present

## 2020-03-09 DIAGNOSIS — I482 Chronic atrial fibrillation, unspecified: Secondary | ICD-10-CM | POA: Diagnosis not present

## 2020-03-09 DIAGNOSIS — E1142 Type 2 diabetes mellitus with diabetic polyneuropathy: Secondary | ICD-10-CM | POA: Diagnosis not present

## 2020-03-09 DIAGNOSIS — N183 Chronic kidney disease, stage 3 unspecified: Secondary | ICD-10-CM | POA: Diagnosis not present

## 2020-03-09 DIAGNOSIS — Z9981 Dependence on supplemental oxygen: Secondary | ICD-10-CM | POA: Diagnosis not present

## 2020-03-09 DIAGNOSIS — I130 Hypertensive heart and chronic kidney disease with heart failure and stage 1 through stage 4 chronic kidney disease, or unspecified chronic kidney disease: Secondary | ICD-10-CM | POA: Diagnosis not present

## 2020-03-09 DIAGNOSIS — L97929 Non-pressure chronic ulcer of unspecified part of left lower leg with unspecified severity: Secondary | ICD-10-CM | POA: Diagnosis not present

## 2020-03-09 DIAGNOSIS — I872 Venous insufficiency (chronic) (peripheral): Secondary | ICD-10-CM | POA: Diagnosis not present

## 2020-03-12 DIAGNOSIS — E039 Hypothyroidism, unspecified: Secondary | ICD-10-CM | POA: Diagnosis not present

## 2020-03-12 DIAGNOSIS — Z9981 Dependence on supplemental oxygen: Secondary | ICD-10-CM | POA: Diagnosis not present

## 2020-03-12 DIAGNOSIS — L97811 Non-pressure chronic ulcer of other part of right lower leg limited to breakdown of skin: Secondary | ICD-10-CM | POA: Diagnosis not present

## 2020-03-12 DIAGNOSIS — L97929 Non-pressure chronic ulcer of unspecified part of left lower leg with unspecified severity: Secondary | ICD-10-CM | POA: Diagnosis not present

## 2020-03-12 DIAGNOSIS — I482 Chronic atrial fibrillation, unspecified: Secondary | ICD-10-CM | POA: Diagnosis not present

## 2020-03-12 DIAGNOSIS — N183 Chronic kidney disease, stage 3 unspecified: Secondary | ICD-10-CM | POA: Diagnosis not present

## 2020-03-12 DIAGNOSIS — I5032 Chronic diastolic (congestive) heart failure: Secondary | ICD-10-CM | POA: Diagnosis not present

## 2020-03-12 DIAGNOSIS — M170 Bilateral primary osteoarthritis of knee: Secondary | ICD-10-CM | POA: Diagnosis not present

## 2020-03-12 DIAGNOSIS — E1151 Type 2 diabetes mellitus with diabetic peripheral angiopathy without gangrene: Secondary | ICD-10-CM | POA: Diagnosis not present

## 2020-03-12 DIAGNOSIS — M797 Fibromyalgia: Secondary | ICD-10-CM | POA: Diagnosis not present

## 2020-03-12 DIAGNOSIS — E785 Hyperlipidemia, unspecified: Secondary | ICD-10-CM | POA: Diagnosis not present

## 2020-03-12 DIAGNOSIS — G2581 Restless legs syndrome: Secondary | ICD-10-CM | POA: Diagnosis not present

## 2020-03-12 DIAGNOSIS — I25119 Atherosclerotic heart disease of native coronary artery with unspecified angina pectoris: Secondary | ICD-10-CM | POA: Diagnosis not present

## 2020-03-12 DIAGNOSIS — I824Z2 Acute embolism and thrombosis of unspecified deep veins of left distal lower extremity: Secondary | ICD-10-CM | POA: Diagnosis not present

## 2020-03-12 DIAGNOSIS — E1122 Type 2 diabetes mellitus with diabetic chronic kidney disease: Secondary | ICD-10-CM | POA: Diagnosis not present

## 2020-03-12 DIAGNOSIS — Z8673 Personal history of transient ischemic attack (TIA), and cerebral infarction without residual deficits: Secondary | ICD-10-CM | POA: Diagnosis not present

## 2020-03-12 DIAGNOSIS — E1142 Type 2 diabetes mellitus with diabetic polyneuropathy: Secondary | ICD-10-CM | POA: Diagnosis not present

## 2020-03-12 DIAGNOSIS — I872 Venous insufficiency (chronic) (peripheral): Secondary | ICD-10-CM | POA: Diagnosis not present

## 2020-03-12 DIAGNOSIS — J449 Chronic obstructive pulmonary disease, unspecified: Secondary | ICD-10-CM | POA: Diagnosis not present

## 2020-03-12 DIAGNOSIS — J9620 Acute and chronic respiratory failure, unspecified whether with hypoxia or hypercapnia: Secondary | ICD-10-CM | POA: Diagnosis not present

## 2020-03-12 DIAGNOSIS — G4733 Obstructive sleep apnea (adult) (pediatric): Secondary | ICD-10-CM | POA: Diagnosis not present

## 2020-03-12 DIAGNOSIS — K219 Gastro-esophageal reflux disease without esophagitis: Secondary | ICD-10-CM | POA: Diagnosis not present

## 2020-03-12 DIAGNOSIS — I252 Old myocardial infarction: Secondary | ICD-10-CM | POA: Diagnosis not present

## 2020-03-12 DIAGNOSIS — I130 Hypertensive heart and chronic kidney disease with heart failure and stage 1 through stage 4 chronic kidney disease, or unspecified chronic kidney disease: Secondary | ICD-10-CM | POA: Diagnosis not present

## 2020-03-13 DIAGNOSIS — N183 Chronic kidney disease, stage 3 unspecified: Secondary | ICD-10-CM | POA: Diagnosis not present

## 2020-03-13 DIAGNOSIS — J449 Chronic obstructive pulmonary disease, unspecified: Secondary | ICD-10-CM | POA: Diagnosis not present

## 2020-03-13 DIAGNOSIS — I130 Hypertensive heart and chronic kidney disease with heart failure and stage 1 through stage 4 chronic kidney disease, or unspecified chronic kidney disease: Secondary | ICD-10-CM | POA: Diagnosis not present

## 2020-03-13 DIAGNOSIS — E1142 Type 2 diabetes mellitus with diabetic polyneuropathy: Secondary | ICD-10-CM | POA: Diagnosis not present

## 2020-03-13 DIAGNOSIS — L97929 Non-pressure chronic ulcer of unspecified part of left lower leg with unspecified severity: Secondary | ICD-10-CM | POA: Diagnosis not present

## 2020-03-13 DIAGNOSIS — G2581 Restless legs syndrome: Secondary | ICD-10-CM | POA: Diagnosis not present

## 2020-03-13 DIAGNOSIS — J9620 Acute and chronic respiratory failure, unspecified whether with hypoxia or hypercapnia: Secondary | ICD-10-CM | POA: Diagnosis not present

## 2020-03-13 DIAGNOSIS — L97811 Non-pressure chronic ulcer of other part of right lower leg limited to breakdown of skin: Secondary | ICD-10-CM | POA: Diagnosis not present

## 2020-03-13 DIAGNOSIS — E1122 Type 2 diabetes mellitus with diabetic chronic kidney disease: Secondary | ICD-10-CM | POA: Diagnosis not present

## 2020-03-13 DIAGNOSIS — E039 Hypothyroidism, unspecified: Secondary | ICD-10-CM | POA: Diagnosis not present

## 2020-03-13 DIAGNOSIS — Z9981 Dependence on supplemental oxygen: Secondary | ICD-10-CM | POA: Diagnosis not present

## 2020-03-13 DIAGNOSIS — Z8673 Personal history of transient ischemic attack (TIA), and cerebral infarction without residual deficits: Secondary | ICD-10-CM | POA: Diagnosis not present

## 2020-03-13 DIAGNOSIS — M170 Bilateral primary osteoarthritis of knee: Secondary | ICD-10-CM | POA: Diagnosis not present

## 2020-03-13 DIAGNOSIS — G4733 Obstructive sleep apnea (adult) (pediatric): Secondary | ICD-10-CM | POA: Diagnosis not present

## 2020-03-13 DIAGNOSIS — I482 Chronic atrial fibrillation, unspecified: Secondary | ICD-10-CM | POA: Diagnosis not present

## 2020-03-13 DIAGNOSIS — I872 Venous insufficiency (chronic) (peripheral): Secondary | ICD-10-CM | POA: Diagnosis not present

## 2020-03-13 DIAGNOSIS — I5032 Chronic diastolic (congestive) heart failure: Secondary | ICD-10-CM | POA: Diagnosis not present

## 2020-03-13 DIAGNOSIS — K219 Gastro-esophageal reflux disease without esophagitis: Secondary | ICD-10-CM | POA: Diagnosis not present

## 2020-03-13 DIAGNOSIS — E1151 Type 2 diabetes mellitus with diabetic peripheral angiopathy without gangrene: Secondary | ICD-10-CM | POA: Diagnosis not present

## 2020-03-13 DIAGNOSIS — I25119 Atherosclerotic heart disease of native coronary artery with unspecified angina pectoris: Secondary | ICD-10-CM | POA: Diagnosis not present

## 2020-03-13 DIAGNOSIS — M797 Fibromyalgia: Secondary | ICD-10-CM | POA: Diagnosis not present

## 2020-03-13 DIAGNOSIS — I252 Old myocardial infarction: Secondary | ICD-10-CM | POA: Diagnosis not present

## 2020-03-13 DIAGNOSIS — I824Z2 Acute embolism and thrombosis of unspecified deep veins of left distal lower extremity: Secondary | ICD-10-CM | POA: Diagnosis not present

## 2020-03-13 DIAGNOSIS — E785 Hyperlipidemia, unspecified: Secondary | ICD-10-CM | POA: Diagnosis not present

## 2020-03-14 ENCOUNTER — Other Ambulatory Visit: Payer: Self-pay

## 2020-03-14 ENCOUNTER — Ambulatory Visit (INDEPENDENT_AMBULATORY_CARE_PROVIDER_SITE_OTHER): Payer: Medicare Other | Admitting: Family Medicine

## 2020-03-14 ENCOUNTER — Encounter: Payer: Self-pay | Admitting: Family Medicine

## 2020-03-14 ENCOUNTER — Ambulatory Visit: Payer: Medicare Other | Admitting: Family Medicine

## 2020-03-14 VITALS — BP 130/60 | HR 80 | Temp 98.1°F | Resp 18 | Ht 67.0 in | Wt 256.8 lb

## 2020-03-14 DIAGNOSIS — M25561 Pain in right knee: Secondary | ICD-10-CM

## 2020-03-14 DIAGNOSIS — E1121 Type 2 diabetes mellitus with diabetic nephropathy: Secondary | ICD-10-CM | POA: Diagnosis not present

## 2020-03-14 DIAGNOSIS — E559 Vitamin D deficiency, unspecified: Secondary | ICD-10-CM

## 2020-03-14 DIAGNOSIS — I110 Hypertensive heart disease with heart failure: Secondary | ICD-10-CM

## 2020-03-14 DIAGNOSIS — I482 Chronic atrial fibrillation, unspecified: Secondary | ICD-10-CM | POA: Diagnosis not present

## 2020-03-14 DIAGNOSIS — E038 Other specified hypothyroidism: Secondary | ICD-10-CM

## 2020-03-14 DIAGNOSIS — F331 Major depressive disorder, recurrent, moderate: Secondary | ICD-10-CM

## 2020-03-14 DIAGNOSIS — I739 Peripheral vascular disease, unspecified: Secondary | ICD-10-CM

## 2020-03-14 DIAGNOSIS — N1832 Chronic kidney disease, stage 3b: Secondary | ICD-10-CM

## 2020-03-14 DIAGNOSIS — I5032 Chronic diastolic (congestive) heart failure: Secondary | ICD-10-CM

## 2020-03-14 DIAGNOSIS — G8929 Other chronic pain: Secondary | ICD-10-CM

## 2020-03-14 DIAGNOSIS — M25562 Pain in left knee: Secondary | ICD-10-CM

## 2020-03-14 DIAGNOSIS — Z794 Long term (current) use of insulin: Secondary | ICD-10-CM

## 2020-03-14 NOTE — Patient Instructions (Addendum)
Take Hydralazine 50mg  TID-DO NOT TAKE 100mg  _(cut 100mg  tablets in half) Take 40 units of Levemir-continue Ozempic  Referral to Cardiology -Dr. Letta Pate previous patient of Dr. Bettina Gavia) Take blood pressure -first morning blood pressure Take glucose -fasting , 2 hours after your biggest meal Social worker from home health to assist with possible placement

## 2020-03-14 NOTE — Progress Notes (Signed)
Subjective:  Patient ID: Jon [name], male    DOB: May 06, 1951  Age: 69 y.o. MRN: [id number]  Chief Complaint  Patient presents with  . Hypothyroidism  . Hypertension  . Diabetes    HPI  DM-glucose readings as recommended reviewed Blood sugars are low in the morning-pt has reduced insulin to . Patient taking 45 units of Levemir at night and  Ozempic weekly. Pt reports sporadic eating due to living situation. Broken hand is swelling and making pt uncomfortable.  Patient no longer has home health nurse or Education officer, museum. Pt states PT did assess him and will be back.pt states wound care nurse comes for evaluation of LE abrasions. Pt concerned about living conditions-states the rest of home other than his room has cat and dog feces. Pt states he no longer has money because family uses his money.  Pt has children in [place] but they no longer talk. He moved from [facility] to [place] with his wife. pts wife died last year.  BP readings reviewed-low readings in the early morning-pt taking Hydralazine TID as recommended by Dr. Tobie Poet. Pt has not seen Dr. Bettina Gavia recently.   Current Outpatient Medications on File Prior to Visit  Medication Sig Dispense Refill  . albuterol (PROVENTIL) (2.5 MG/3ML) 0.083% nebulizer solution Take 3 mLs (2.5 mg total) by nebulization 2 (two) times daily. And as needed (Patient taking differently: Take 2.5 mg by nebulization in the morning, at noon, in the evening, and at bedtime. And as needed) 75 mL 5  . atorvastatin (LIPITOR) 40 MG tablet Take 40 mg by mouth daily.    . Azelastine HCl 0.15 % SOLN Place 2 sprays into both nostrils daily.     . BD PEN NEEDLE NANO U/F 32G X 4 MM MISC     . clopidogrel (PLAVIX) 75 MG tablet Take 1 tablet (75 mg total) by mouth daily. 30 tablet 5  . diltiazem (CARDIZEM CD) 120 MG 24 hr capsule Take 1 capsule (120 mg total) by mouth daily. (Patient not taking: Reported on 03/08/2020) 90 capsule 0  . docusate sodium (COLACE) 100 MG capsule Take 100 mg by mouth 2  (two) times daily.    . DULERA 200-5 MCG/ACT AERO Inhale 1 puff into the lungs in the morning and at bedtime.     . famotidine (PEPCID) 20 MG tablet TAKE 2 TABLETS BY MOUTH TWICE DAILY 120 tablet 5  . ferrous sulfate 325 (65 FE) MG tablet Take 325 mg by mouth daily.    . Fluticasone Furoate-Vilanterol (BREO ELLIPTA IN) Inhale 200 mcg into the lungs daily. (Patient not taking: Reported on 03/08/2020)    . Glucagon, rDNA, (GLUCAGON EMERGENCY) 1 MG KIT  (Patient not taking: Reported on 03/08/2020)    . hydrALAZINE (APRESOLINE) 100 MG tablet Take 1 tablet (100 mg total) by mouth 2 (two) times daily. (Patient taking differently: Take 100 mg by mouth 3 (three) times daily. ) 60 tablet 2  . insulin detemir (LEVEMIR FLEXTOUCH) 100 UNIT/ML FlexPen Inject 65 Units into the skin daily. (Patient taking differently: Inject 45 Units into the skin daily. ) 30 mL 2  . isosorbide mononitrate (IMDUR) 120 MG 24 hr tablet TAKE 1 TABLET BY MOUTH ONCE DAILY IN THEMORNINGS 90 tablet 0  . metolazone (ZAROXOLYN) 2.5 MG tablet Take 1 tablet (2.5 mg total) by mouth 2 (two) times a week. (Patient taking differently: Take 2.5 mg by mouth daily. ) 30 tablet 3  . metoprolol succinate (TOPROL-XL) 50 MG 24 hr tablet TAKE  1 TABLET BY MOUTH ONCE DAILY 90 tablet 0  . MOVANTIK 25 MG TABS tablet SMARTSIG:1 Tablet(s) By Mouth Daily    . nitroGLYCERIN (NITROSTAT) 0.4 MG SL tablet Place 0.4 mg under the tongue every 5 (five) minutes as needed for chest pain.     . OXYGEN Inhale into the lungs. 2 liters at bedtime    . pantoprazole (PROTONIX) 40 MG tablet Take 40 mg by mouth in the morning.     . Potassium Chloride ER 20 MEQ TBCR Take 40 mEq by mouth 2 (two) times daily.  (Patient not taking: Reported on 03/08/2020)    . potassium chloride SA (KLOR-CON) 20 MEQ tablet TAKE TWO TABLETS BY MOUTH TWICE A DAY 360 tablet 0  . pregabalin (LYRICA) 75 MG capsule Take 1 capsule (75 mg total) by mouth 2 (two) times daily. 60 capsule 0  . RELISTOR 150 MG  TABS TAKE 3 TABLETS BY MOUTH EVERY MORNING 90 tablet 0  . Semaglutide (OZEMPIC, 0.25 OR 0.5 MG/DOSE, ) Inject 0.25 mg into the skin once a week.    . torsemide (DEMADEX) 20 MG tablet Take 3 tablets (60 mg total) by mouth 2 (two) times daily. 180 tablet 0  . Vitamin D, Ergocalciferol, (DRISDOL) 1.25 MG (50000 UNIT) CAPS capsule TAKE 1 CAPSULE BY MOUTH DAILY 90 capsule 1  . XARELTO 15 MG TABS tablet TAKE 1 TABLET BY MOUTH ONCE DAILY WITH EVENING MEAL (Patient taking differently: Take 15 mg by mouth daily after supper. ) 90 tablet 2  . XTAMPZA ER 27 MG C12A TAKE 1 CAPSULE BY MOUTH TWICE DAILY 60 capsule 0   No current facility-administered medications on file prior to visit.   Past Medical History:  Diagnosis Date  . Abnormal EKG 11/29/2014  . Acute renal insufficiency 11/29/2014  . Angina   . Arthritis    "knees" (01/18/2014)  . Asthma   . Atrial fibrillation ([place])   . Back pain 01/25/2015  . 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 BOOP (bronchiolitis obliterans with organizing pneumonia) ([place]) 01/20/2014   OLBx 01/20/14 BOOP Started steroids 02/28/14   . Chronic anticoagulation-Xarelto 02/09/2014  . Chronic atrial fibrillation ([place]) 01/03/2014  . Chronic bronchitis ([place]) 12/21/2013  . Chronic diastolic heart failure ([place]) 02/22/2015  . Chronic pain of both knees 03/18/2017   Overview:  Added automatically from request for surgery [id number]  . Chronic respiratory failure ([place]) 04/10/2015  . COPD (chronic obstructive pulmonary disease) ([place]) 01/25/2015  . Cough    coughing up blood- started last nite, seems to be worse now  . CVA (cerebral vascular accident) ([place]) 02/09/2014  . Dysrhythmia    atrial fib takes cardizem,   . Fibromyalgia   . GERD (gastroesophageal reflux disease)   . H/O hiatal hernia   . Hemoptysis 08/01/2015  . Herpes zoster 06/26/2015  . History of stroke   . Hyperkalemia-repeat pending 11/29/2014  . Hyperlipemia   . Hyperlipidemia   . Hypertension   . Hypothyroidism   . Long  term (current) use of anticoagulants 03/21/2014  . Mild CAD 02/20/2015   Overview:  On recent cardiac cath  Overview:  On recent cardiac cath  . Myocardial infarction [facility])    "one dr says yes; another says no"  . Neuromuscular disorder (HCC)    rls, neuropathy  . Neuropathy    rls, neuropathy   . Normal coronary arteries 2011 11/29/2014  . Obesity (BMI 30-39.9)   . On home oxygen therapy    "2L w/CPAP at bedtime" (09/01/2018)  .  OSA on CPAP    cpap & oxygen  . Pericardial effusion   . Peritoneal effusion, chronic 02/22/2015   Overview:  With surgical window  . PNA (pneumonia) 04/10/2015  . Pneumonia 5/15   "several times" (01/18/2014)  . Precordial pain 01/26/2015  . Preoperative cardiovascular examination 02/24/2016  . Primary osteoarthritis of both knees 03/18/2017   Overview:  Added automatically from request for surgery [id number]  . Restless leg syndrome   . Sleep apnea    cpap & oxygen   . Stroke [facility]) 2012   denies residual on 01/18/2014  . Troponin level elevated 11/29/2014  . Uncontrolled type 2 diabetes mellitus with diabetic polyneuropathy, with long-term current use of insulin ([place]) 12/21/2013   Past Surgical History:  Procedure Laterality Date  . ABDOMINAL AORTOGRAM W/LOWER EXTREMITY N/A 09/08/2018   Procedure: ABDOMINAL AORTOGRAM W/LOWER EXTREMITY;  Surgeon: Marty Heck, MD;  Location: [place] CV [facility];  Service: Cardiovascular;  Laterality: N/A;  . AMPUTATION Right 09/09/2018   Procedure: AMPUTATION RIGHT GREAT TOE;  Surgeon: Waynetta Sandy, MD;  Location: [place];  Service: Vascular;  Laterality: Right;  . APPENDECTOMY    . CARDIAC CATHETERIZATION  X 2 then 03/03/2012    NL LVF, normal coronaries, vessels are small ([facility]: Dr. Beatrix Fetters)  . CATARACT EXTRACTION W/ INTRAOCULAR LENS  IMPLANT, BILATERAL Bilateral   . CHOLECYSTECTOMY    . HERNIA REPAIR     UHR  . LAPAROSCOPIC GASTRIC BANDING  2010  . LOWER EXTREMITY ANGIOGRAPHY Left 10/11/2018   Procedure: LOWER  EXTREMITY ANGIOGRAPHY;  Surgeon: Waynetta Sandy, MD;  Location: [place] CV LAB;  Service: Cardiovascular;  Laterality: Left;  . PERICARDIAL WINDOW Left 01/24/2014   Procedure: PERICARDIAL WINDOW;  Surgeon: Gaye Pollack, MD;  Location: [place];  Service: Thoracic;  Laterality: Left;  . PERIPHERAL VASCULAR INTERVENTION Right 09/08/2018   Procedure: PERIPHERAL VASCULAR INTERVENTION;  Surgeon: Marty Heck, MD;  Location: [place] CV LAB;  Service: Cardiovascular;  Laterality: Right;  . SINUS EXPLORATION  X 2  . TEE WITHOUT CARDIOVERSION N/A 09/07/2018   Procedure: TRANSESOPHAGEAL ECHOCARDIOGRAM (TEE);  Surgeon: Acie Fredrickson Wonda Cheng, MD;  Location: [place];  Service: Cardiovascular;  Laterality: N/A;  . UMBILICAL HERNIA REPAIR    . VIDEO ASSISTED THORACOSCOPY Left 01/24/2014   Procedure: VIDEO ASSISTED THORACOSCOPY;  Surgeon: Gaye Pollack, MD;  Location: [facility] OR;  Service: Thoracic;  Laterality: Left;  VATS/open lung biopsy  . VIDEO BRONCHOSCOPY Bilateral 12/29/2013   Procedure: VIDEO BRONCHOSCOPY WITHOUT FLUORO;  Surgeon: Elsie Stain, MD;  Location: [facility];  Service: Endoscopy;  Laterality: Bilateral;  . VIDEO BRONCHOSCOPY N/A 01/24/2014   Procedure: VIDEO BRONCHOSCOPY;  Surgeon: Gaye Pollack, MD;  Location: [facility] OR;  Service: Thoracic;  Laterality: N/A;    Family History  Problem Relation Age of Onset  . Cancer Mother   . Stroke Father   . Heart disease Father   . Seizures Sister   . Diabetes Sister   . Anesthesia problems Neg Hx   . Hypotension Neg Hx   . Malignant hyperthermia Neg Hx   . Pseudochol deficiency Neg Hx    Social History   Socioeconomic History  . Marital status: Widowed    Spouse name: Not on file  . Number of children: Not on file  . Years of education: Not on file  . Highest education level: Not on file  Occupational History  . Not on file  Tobacco Use  . Smoking status: Former  Smoker    Types: Cigars    Quit date: 09/01/2006     Years since quitting: 13.5  . Smokeless tobacco: Never Used  Vaping Use  . Vaping Use: Never used  Substance and Sexual Activity  . 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 Drug use: No  . Sexual activity: Never  Other Topics Concern  . Not on file  Social History Narrative  . Not on file   Social Determinants of Health   Financial Resource Strain:   . Difficulty of Paying Living Expenses:   Food Insecurity:   . Worried About Charity fundraiser in the Last Year:   . Arboriculturist in the Last Year:   Transportation Needs: No Transportation Needs  . Lack of Transportation (Medical): No  . Lack of Transportation (Non-Medical): No  Physical Activity: Inactive  . Days of Exercise per Week: 0 days  . Minutes of Exercise per Session: 0 min  Stress:   . Feeling of Stress :   Social Connections:   . Frequency of Communication with Friends and Family:   . Frequency of Social Gatherings with Friends and Family:   . Attends Religious Services:   . Active Member of Clubs or Organizations:   . Attends Archivist Meetings:   Marland Kitchen Marital Status:     Review of Systems  Constitutional: Negative for chills, fatigue and fever.  HENT: Negative for congestion, ear pain and sore throat.   Respiratory: Negative for cough and shortness of breath.   Cardiovascular: Positive for leg swelling. Negative for chest pain.  Gastrointestinal: Negative for abdominal pain, constipation, diarrhea, nausea and vomiting.  Musculoskeletal: Negative for arthralgias and myalgias.  Skin:       Bandages attached to bilat lower legs  Neurological: Negative for dizziness and headaches.  Psychiatric/Behavioral: Negative for dysphoric mood.     Objective:  BP 130/60   Pulse 80   Temp 98.1 F (36.7 C)   Resp 18   Ht '5\' 7"'  (1.702 m)   Wt 256 lb 12.8 oz (116.5 kg)   SpO2 (!) 84%   BMI 40.22 kg/m   BP/Weight 03/14/2020 03/07/2020 04/11/9146  Systolic BP 829 562  130  Diastolic BP 60 80 84  Wt. (Lbs) 256.8 255 263  BMI 40.22 39.94 41.19    Physical Exam Vitals reviewed.  Constitutional:      Comments: Scratches on the arms bilat Bandages on the legs bilat Swelling right hand  Cardiovascular:     Rate and Rhythm: Normal rate and regular rhythm.     Pulses: Normal pulses.     Heart sounds: Normal heart sounds.  Pulmonary:     Effort: Pulmonary effort is normal.     Breath sounds: Normal breath sounds.  Neurological:     Mental Status: He is alert and oriented to person, place, and time.     Gait: Gait abnormal.     Comments: MMS completed-pt unwilling to attempt spelling backward or subtraction. Felt likely educational rather than inability to comprehend or calculate.   Psychiatric:        Mood and Affect: Mood normal.        Behavior: Behavior normal.     Comments: MMS 25      Lab Results  Component Value Date   WBC 7.5 12/13/2019   HGB 14.3 12/13/2019   HCT 43.0 12/13/2019   PLT 241 12/13/2019   GLUCOSE  208 (H) 03/07/2020   CHOL 117 12/13/2019   TRIG 124 12/13/2019   HDL 40 12/13/2019   LDLCALC 55 12/13/2019   ALT 13 03/07/2020   AST 15 03/07/2020   NA 142 03/07/2020   K 3.5 03/07/2020   CL 99 03/07/2020   CREATININE 2.04 (H) 03/07/2020   BUN 40 (H) 03/07/2020   CO2 28 03/07/2020   TSH 5.170 (H) 12/13/2019   INR 1.68 09/01/2018   HGBA1C 8.6 (H) 12/13/2019      Assessment & Plan:  1. Chronic atrial fibrillation (HCC) Rate wnl-stable-pt request cardio appt-no signs of CHF - Ambulatory referral to Cardiology  2. Other specified hypothyroidism TSH completed-slightly elevated-dose was not changed in 4/21-recheck with A1c at follow up appointment.   3. Hypertensive heart disease with heart failure (HCC) Hypotension with current medication dosage-decrease to Hydralazine 14m(cut 1070mmedication in 1/2 tablet form. F/u with Dr. MuMarylene LandV since Dr. MuBettina Gaviaeft [place] years ago - Ambulatory referral to  Cardiology  4. Type 2 diabetes mellitus with stage 3b chronic kidney disease, with long-term current use of insulin (HCDunniganRecent blood work-BMP-pt seen by nephrology-notes not available-glucose readings show low morning glucose with addition of ozempic. D/w pt decrease in insulin to 40 units Levemir. LDL 55 Pt agrees to glucose monitoring fasting and 2 hours after largest meal.  5. Moderate recurrent major hronic diastolic heart failure (HCAndaleRecent [facility] - Ambulatory referral to Cardiology  7. PVD (peripheral vascular disease) ([place]- Ambulatory referral to Cardiology  8. Vitamin D insufficiency-normal level   61.8-2/21 9. Chronic pain of both knees stable Over an hours spent reviewing old records, discussion on history. Living conditions. Last labwork 4/30 while hospitalized. -will hold on repeat labwork until f/u . Concern for living conditions-pt reported unclean conditions with current wounds-at risk with DM/CAD. Will d/w HHRhodhissho sees pt in home.    [phone]

## 2020-03-15 NOTE — Progress Notes (Deleted)
Cardiology Office Note:    Date:  03/15/2020   ID:  Jon [name], DOB 1950-11-04, MRN [id number]  PCP:  Rochel Brome, MD  Cardiologist:  Shirlee More, MD    Referring MD: Maryruth Hancock, MD    ASSESSMENT:    No diagnosis found. PLAN:    In order of problems listed above:  1. ***   Next appointment: ***   Medication Adjustments/Labs and Tests Ordered: Current medicines are reviewed at length with the patient today.  Concerns regarding medicines are outlined above.  No orders of the defined types were placed in this encounter.  No orders of the defined types were placed in this encounter.   No chief complaint on file.   History of Present Illness:    Jon [name] is a 69 y.o. male with a hx of  CHF, Atrial Fibrillation, HTN, LBBB, CVA, COPD, PAD and sleep apnea  last seen 04/07/2020.He had a pericardial window in 2015 and stroke while off anticoagulation perioperatively. Compliance with diet, lifestyle and medications: ***  TEE 09/07/2018: Study Conclusions  - Left ventricle: Systolic function was normal. The estimated  ejection fraction was in the range of 55% to 60%.  - Aortic valve: No evidence of vegetation. There was mild  regurgitation.  - Mitral valve: No evidence of vegetation. There was mild to  moderate regurgitation.  - Left atrium: No evidence of thrombus in the atrial cavity or  appendage.  - Right atrium: No evidence of thrombus in the atrial cavity or  appendage.  - Tricuspid valve: No evidence of vegetation. There was moderate  regurgitation.  - Pulmonic valve: No evidence of vegetation.   Echo 12/31/2019 at [facility]: SUMMARY  Left ventricular systolic function is normal.  LV ejection fraction = 50-55%.  Unable to fully assess LV regional wall motion  Left ventricular filling pattern is indeterminate.  The right ventricular systolic function is normal.  There is mild aortic stenosis.  There is trace tricuspid regurgitation.    Mild to moderate pulmonary hypertension.  Estimated right ventricular systolic pressure is 50 mmHg.   Past Medical History:  Diagnosis Date  . Abnormal EKG 11/29/2014  . Acute renal insufficiency 11/29/2014  . Angina   . Arthritis    "knees" (01/18/2014)  . Asthma   . Atrial fibrillation ([place])   . Back pain 01/25/2015  . 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 BOOP (bronchiolitis obliterans with organizing pneumonia) ([place]) 01/20/2014   OLBx 01/20/14 BOOP Started steroids 02/28/14   . Chronic anticoagulation-Xarelto 02/09/2014  . Chronic atrial fibrillation ([place]) 01/03/2014  . Chronic bronchitis ([place]) 12/21/2013  . Chronic diastolic heart failure ([place]) 02/22/2015  . Chronic pain of both knees 03/18/2017   Overview:  Added automatically from request for surgery [id number]  . Chronic respiratory failure ([place]) 04/10/2015  . COPD (chronic obstructive pulmonary disease) ([place]) 01/25/2015  . Cough    coughing up blood- started last nite, seems to be worse now  . CVA (cerebral vascular accident) ([place]) 02/09/2014  . Dysrhythmia    atrial fib takes cardizem,   . Fibromyalgia   . GERD (gastroesophageal reflux disease)   . H/O hiatal hernia   . Hemoptysis 08/01/2015  . Herpes zoster 06/26/2015  . History of stroke   . Hyperkalemia-repeat pending 11/29/2014  . Hyperlipemia   . Hyperlipidemia   . Hypertension   . Hypothyroidism   . Long term (current) use of anticoagulants 03/21/2014  . Mild CAD 02/20/2015   Overview:  On recent  cardiac cath  Overview:  On recent cardiac cath  . Myocardial infarction [facility])    "one dr says yes; another says no"  . Neuromuscular disorder (HCC)    rls, neuropathy  . Neuropathy    rls, neuropathy   . Normal coronary arteries 2011 11/29/2014  . Obesity (BMI 30-39.9)   . On home oxygen therapy    "2L w/CPAP at bedtime" (09/01/2018)  . OSA on CPAP    cpap & oxygen  . Pericardial effusion   . Peritoneal effusion, chronic 02/22/2015   Overview:  With surgical window  . PNA  (pneumonia) 04/10/2015  . Pneumonia 5/15   "several times" (01/18/2014)  . Precordial pain 01/26/2015  . Preoperative cardiovascular examination 02/24/2016  . Primary osteoarthritis of both knees 03/18/2017   Overview:  Added automatically from request for surgery [id number]  . Restless leg syndrome   . Sleep apnea    cpap & oxygen   . Stroke [facility]) 2012   denies residual on 01/18/2014  . Troponin level elevated 11/29/2014  . Uncontrolled type 2 diabetes mellitus with diabetic polyneuropathy, with long-term current use of insulin ([place]) 12/21/2013    Past Surgical History:  Procedure Laterality Date  . ABDOMINAL AORTOGRAM W/LOWER EXTREMITY N/A 09/08/2018   Procedure: ABDOMINAL AORTOGRAM W/LOWER EXTREMITY;  Surgeon: Marty Heck, MD;  Location: [place] [facility];  Service: Cardiovascular;  Laterality: N/A;  . AMPUTATION Right 09/09/2018   Procedure: AMPUTATION RIGHT GREAT TOE;  Surgeon: Waynetta Sandy, MD;  Location: [place];  Service: Vascular;  Laterality: Right;  . APPENDECTOMY    . CARDIAC CATHETERIZATION  X 2 then 03/03/2012    NL LVF, normal coronaries, vessels are small ([facility]: Dr. Beatrix Fetters)  . CATARACT EXTRACTION W/ INTRAOCULAR LENS  IMPLANT, BILATERAL Bilateral   . CHOLECYSTECTOMY    . HERNIA REPAIR     UHR  . LAPAROSCOPIC GASTRIC BANDING  2010  . LOWER EXTREMITY ANGIOGRAPHY Left 10/11/2018   Procedure: LOWER EXTREMITY ANGIOGRAPHY;  Surgeon: Waynetta Sandy, MD;  Location: [place] [facility];  Service: Cardiovascular;  Laterality: Left;  . PERICARDIAL WINDOW Left 01/24/2014   Procedure: PERICARDIAL WINDOW;  Surgeon: Gaye Pollack, MD;  Location: [place];  Service: Thoracic;  Laterality: Left;  . PERIPHERAL VASCULAR INTERVENTION Right 09/08/2018   Procedure: PERIPHERAL VASCULAR INTERVENTION;  Surgeon: Marty Heck, MD;  Location: [place] [facility];  Service: Cardiovascular;  Laterality: Right;  . SINUS EXPLORATION  X 2  . TEE WITHOUT CARDIOVERSION N/A 09/07/2018    Procedure: TRANSESOPHAGEAL ECHOCARDIOGRAM (TEE);  Surgeon: Acie Fredrickson Wonda Cheng, MD;  Location: [place];  Service: Cardiovascular;  Laterality: N/A;  . UMBILICAL HERNIA REPAIR    . VIDEO ASSISTED THORACOSCOPY Left 01/24/2014   Procedure: VIDEO ASSISTED THORACOSCOPY;  Surgeon: Gaye Pollack, MD;  Location[facility];  Service: Thoracic;  Laterality: Left;  VATS/open lung biopsy  . VIDEO BRONCHOSCOPY Bilateral 12/29/2013   Procedure: VIDEO BRONCHOSCOPY WITHOUT FLUORO;  Surgeon: Elsie Stain, MD;  Location: [facility];  Service: Endoscopy;  Laterality: Bilateral;  . VIDEO BRONCHOSCOPY N/A 01/24/2014   Procedure: VIDEO BRONCHOSCOPY;  Surgeon: Gaye Pollack, MD;  Location: [facility] OR;  Service: Thoracic;  Laterality: N/A;    Current Medications: No outpatient medications have been marked as taking for the 03/16/20 encounter (Appointment) with Richardo Priest, MD.     Allergies:   Hydrocodone, Morphine, Duloxetine hcl, Codeine, and Penicillins   Social History   Socioeconomic History  . Marital status: Widowed  Spouse name: Not on file  . Number of children: Not on file  . Years of education: Not on file  . Highest education level: Not on file  Occupational History  . Not on file  Tobacco Use  . Smoking status: Former Smoker    Types: Cigars    Quit date: 09/01/2006    Years since quitting: 13.5  . Smokeless tobacco: Never Used  Vaping Use  . Vaping Use: Never used  Substance and Sexual Activity  . 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 Drug use: No  . Sexual activity: Never  Other Topics Concern  . Not on file  Social History Narrative  . Not on file   Social Determinants of Health   Financial Resource Strain:   . Difficulty of Paying Living Expenses:   Food Insecurity:   . Worried About Charity fundraiser in the Last Year:   . Arboriculturist in the Last Year:   Transportation Needs: No Transportation Needs  . Lack of Transportation  (Medical): No  . Lack of Transportation (Non-Medical): No  Physical Activity: Inactive  . Days of Exercise per Week: 0 days  . Minutes of Exercise per Session: 0 min  Stress:   . Feeling of Stress :   Social Connections:   . Frequency of Communication with Friends and Family:   . Frequency of Social Gatherings with Friends and Family:   . Attends Religious Services:   . Active Member of Clubs or Organizations:   . Attends Archivist Meetings:   Marland Kitchen Marital Status:      Family History: The patient's ***family history includes Cancer in his mother; Diabetes in his sister; Heart disease in his father; Seizures in his sister; Stroke in his father. There is no history of Anesthesia problems, Hypotension, Malignant hyperthermia, or Pseudochol deficiency. ROS:   Please see the history of present illness.    All other systems reviewed and are negative.  EKGs/Labs/Other Studies Reviewed:    The following studies were reviewed today:  EKG:  EKG ordered today and personally reviewed.  The ekg ordered today demonstrates ***  Recent Labs: 12/13/2019: Hemoglobin 14.3; Platelets 241; TSH 5.170 03/07/2020: ALT 13; BUN 40; Creatinine, Ser 2.04; Potassium 3.5; Sodium 142  Recent Lipid Panel    Component Value Date/Time   CHOL 117 12/13/2019 0751   TRIG 124 12/13/2019 0751   HDL 40 12/13/2019 0751   CHOLHDL 2.9 12/13/2019 0751   CHOLHDL 2.9 01/31/2014 0337   VLDL 14 01/31/2014 0337   LDLCALC 55 12/13/2019 0751    Physical Exam:    VS:  There were no vitals taken for this visit.    Wt Readings from Last 3 Encounters:  03/14/20 256 lb 12.8 oz (116.5 kg)  03/07/20 255 lb (115.7 kg)  02/16/20 263 lb (119.3 kg)     GEN: *** Well nourished, well developed in no acute distress HEENT: Normal NECK: No JVD; No carotid bruits LYMPHATICS: No lymphadenopathy CARDIAC: ***RRR, no murmurs, rubs, gallops RESPIRATORY:  Clear to auscultation without rales, wheezing or rhonchi  ABDOMEN:  Soft, non-tender, non-distended MUSCULOSKELETAL:  No edema; No deformity  SKIN: Warm and dry NEUROLOGIC:  Alert and oriented x 3 PSYCHIATRIC:  Normal affect    Signed, Shirlee More, MD  03/15/2020 9:30 PM    [place]

## 2020-03-16 ENCOUNTER — Ambulatory Visit: Payer: Medicare Other | Admitting: Cardiology

## 2020-03-16 ENCOUNTER — Telehealth: Payer: Self-pay

## 2020-03-16 DIAGNOSIS — I25119 Atherosclerotic heart disease of native coronary artery with unspecified angina pectoris: Secondary | ICD-10-CM | POA: Diagnosis not present

## 2020-03-16 DIAGNOSIS — I130 Hypertensive heart and chronic kidney disease with heart failure and stage 1 through stage 4 chronic kidney disease, or unspecified chronic kidney disease: Secondary | ICD-10-CM | POA: Diagnosis not present

## 2020-03-16 DIAGNOSIS — E1122 Type 2 diabetes mellitus with diabetic chronic kidney disease: Secondary | ICD-10-CM | POA: Diagnosis not present

## 2020-03-16 DIAGNOSIS — E1142 Type 2 diabetes mellitus with diabetic polyneuropathy: Secondary | ICD-10-CM | POA: Diagnosis not present

## 2020-03-16 DIAGNOSIS — M797 Fibromyalgia: Secondary | ICD-10-CM | POA: Diagnosis not present

## 2020-03-16 DIAGNOSIS — E785 Hyperlipidemia, unspecified: Secondary | ICD-10-CM | POA: Diagnosis not present

## 2020-03-16 DIAGNOSIS — G4733 Obstructive sleep apnea (adult) (pediatric): Secondary | ICD-10-CM | POA: Diagnosis not present

## 2020-03-16 DIAGNOSIS — J9620 Acute and chronic respiratory failure, unspecified whether with hypoxia or hypercapnia: Secondary | ICD-10-CM | POA: Diagnosis not present

## 2020-03-16 DIAGNOSIS — I824Z2 Acute embolism and thrombosis of unspecified deep veins of left distal lower extremity: Secondary | ICD-10-CM | POA: Diagnosis not present

## 2020-03-16 DIAGNOSIS — Z8673 Personal history of transient ischemic attack (TIA), and cerebral infarction without residual deficits: Secondary | ICD-10-CM | POA: Diagnosis not present

## 2020-03-16 DIAGNOSIS — L97929 Non-pressure chronic ulcer of unspecified part of left lower leg with unspecified severity: Secondary | ICD-10-CM | POA: Diagnosis not present

## 2020-03-16 DIAGNOSIS — J449 Chronic obstructive pulmonary disease, unspecified: Secondary | ICD-10-CM | POA: Diagnosis not present

## 2020-03-16 DIAGNOSIS — I482 Chronic atrial fibrillation, unspecified: Secondary | ICD-10-CM | POA: Diagnosis not present

## 2020-03-16 DIAGNOSIS — I5032 Chronic diastolic (congestive) heart failure: Secondary | ICD-10-CM | POA: Diagnosis not present

## 2020-03-16 DIAGNOSIS — E039 Hypothyroidism, unspecified: Secondary | ICD-10-CM | POA: Diagnosis not present

## 2020-03-16 DIAGNOSIS — L97811 Non-pressure chronic ulcer of other part of right lower leg limited to breakdown of skin: Secondary | ICD-10-CM | POA: Diagnosis not present

## 2020-03-16 DIAGNOSIS — Z9981 Dependence on supplemental oxygen: Secondary | ICD-10-CM | POA: Diagnosis not present

## 2020-03-16 DIAGNOSIS — N183 Chronic kidney disease, stage 3 unspecified: Secondary | ICD-10-CM | POA: Diagnosis not present

## 2020-03-16 DIAGNOSIS — E1151 Type 2 diabetes mellitus with diabetic peripheral angiopathy without gangrene: Secondary | ICD-10-CM | POA: Diagnosis not present

## 2020-03-16 DIAGNOSIS — M170 Bilateral primary osteoarthritis of knee: Secondary | ICD-10-CM | POA: Diagnosis not present

## 2020-03-16 DIAGNOSIS — K219 Gastro-esophageal reflux disease without esophagitis: Secondary | ICD-10-CM | POA: Diagnosis not present

## 2020-03-16 DIAGNOSIS — I872 Venous insufficiency (chronic) (peripheral): Secondary | ICD-10-CM | POA: Diagnosis not present

## 2020-03-16 DIAGNOSIS — G2581 Restless legs syndrome: Secondary | ICD-10-CM | POA: Diagnosis not present

## 2020-03-16 DIAGNOSIS — I252 Old myocardial infarction: Secondary | ICD-10-CM | POA: Diagnosis not present

## 2020-03-16 NOTE — Telephone Encounter (Signed)
[facility] PT has Dismissed Patient due to unacceptable behavior.

## 2020-03-19 ENCOUNTER — Ambulatory Visit: Payer: Medicare Other | Admitting: Podiatry

## 2020-03-19 ENCOUNTER — Other Ambulatory Visit: Payer: Self-pay

## 2020-03-19 DIAGNOSIS — B351 Tinea unguium: Secondary | ICD-10-CM | POA: Diagnosis not present

## 2020-03-19 DIAGNOSIS — L97511 Non-pressure chronic ulcer of other part of right foot limited to breakdown of skin: Secondary | ICD-10-CM

## 2020-03-19 DIAGNOSIS — E11621 Type 2 diabetes mellitus with foot ulcer: Secondary | ICD-10-CM

## 2020-03-19 DIAGNOSIS — Z89619 Acquired absence of unspecified leg above knee: Secondary | ICD-10-CM

## 2020-03-19 DIAGNOSIS — E1169 Type 2 diabetes mellitus with other specified complication: Secondary | ICD-10-CM

## 2020-03-19 NOTE — Progress Notes (Signed)
  Subjective:  Patient ID: Jon [name], male    DOB: 07-30-1951,  MRN: [id number]  Chief Complaint  Patient presents with  . debride    DFC  . Diabetes    FBS: 137 a1C: unknown PCP; [facility] x last week  . Foot Injury    pt states he fell -pt states,' feet slipped out from under me and fell" - injured Rt 3rd toenail (discolored) and Laceration at 2nd toe (top) x 2 wks; 7/10 pain -w/ bleeding and swelling Tx: neosproin and bandaid   69 y.o. male presents with the above complaint. History confirmed with patient.   Objective:  Physical Exam: warm, good capillary refill, nail exam onychomycosis of the toenails, no trophic changes. DP pulses palpable, PT pulses palpable and protective sensation absent  Absent right hallux. Hammertoes bilat  Right 2nd toe PIPJ ulcer without warmth erythema signs of acute infeciton. No probe to bone. Wound 0.2 x0.2 cm.   No images are attached to the encounter.  Assessment:   1. Onychomycosis   2. 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 Diabetic ulcer of toe of right foot associated with type 2 diabetes mellitus, limited to breakdown of skin ([place])    Plan:  Patient was evaluated and treated and all questions answered.  Onychomycosis, Diabetes and DPN -Patient is diabetic with a qualifying condition for at risk foot care.  Procedure: Nail Debridement Rationale: Patient meets criteria for routine foot care due to DPN Type of Debridement: manual, sharp debridement. Instrumentation: Nail nipper, rotary burr. Number of Nails: 9  Ulcer Toe right foot 2nd toe -Dressed with abx ointment and band-aid -Offload with surgical shoe  No follow-ups on file.

## 2020-03-20 DIAGNOSIS — E1151 Type 2 diabetes mellitus with diabetic peripheral angiopathy without gangrene: Secondary | ICD-10-CM | POA: Diagnosis not present

## 2020-03-20 DIAGNOSIS — G2581 Restless legs syndrome: Secondary | ICD-10-CM | POA: Diagnosis not present

## 2020-03-20 DIAGNOSIS — Z8673 Personal history of transient ischemic attack (TIA), and cerebral infarction without residual deficits: Secondary | ICD-10-CM | POA: Diagnosis not present

## 2020-03-20 DIAGNOSIS — I482 Chronic atrial fibrillation, unspecified: Secondary | ICD-10-CM | POA: Diagnosis not present

## 2020-03-20 DIAGNOSIS — N183 Chronic kidney disease, stage 3 unspecified: Secondary | ICD-10-CM | POA: Diagnosis not present

## 2020-03-20 DIAGNOSIS — E1122 Type 2 diabetes mellitus with diabetic chronic kidney disease: Secondary | ICD-10-CM | POA: Diagnosis not present

## 2020-03-20 DIAGNOSIS — E785 Hyperlipidemia, unspecified: Secondary | ICD-10-CM | POA: Diagnosis not present

## 2020-03-20 DIAGNOSIS — Z9981 Dependence on supplemental oxygen: Secondary | ICD-10-CM | POA: Diagnosis not present

## 2020-03-20 DIAGNOSIS — J9620 Acute and chronic respiratory failure, unspecified whether with hypoxia or hypercapnia: Secondary | ICD-10-CM | POA: Diagnosis not present

## 2020-03-20 DIAGNOSIS — I25119 Atherosclerotic heart disease of native coronary artery with unspecified angina pectoris: Secondary | ICD-10-CM | POA: Diagnosis not present

## 2020-03-20 DIAGNOSIS — L97811 Non-pressure chronic ulcer of other part of right lower leg limited to breakdown of skin: Secondary | ICD-10-CM | POA: Diagnosis not present

## 2020-03-20 DIAGNOSIS — I824Z2 Acute embolism and thrombosis of unspecified deep veins of left distal lower extremity: Secondary | ICD-10-CM | POA: Diagnosis not present

## 2020-03-20 DIAGNOSIS — K219 Gastro-esophageal reflux disease without esophagitis: Secondary | ICD-10-CM | POA: Diagnosis not present

## 2020-03-20 DIAGNOSIS — I130 Hypertensive heart and chronic kidney disease with heart failure and stage 1 through stage 4 chronic kidney disease, or unspecified chronic kidney disease: Secondary | ICD-10-CM | POA: Diagnosis not present

## 2020-03-20 DIAGNOSIS — E1142 Type 2 diabetes mellitus with diabetic polyneuropathy: Secondary | ICD-10-CM | POA: Diagnosis not present

## 2020-03-20 DIAGNOSIS — E039 Hypothyroidism, unspecified: Secondary | ICD-10-CM | POA: Diagnosis not present

## 2020-03-20 DIAGNOSIS — M797 Fibromyalgia: Secondary | ICD-10-CM | POA: Diagnosis not present

## 2020-03-20 DIAGNOSIS — L97929 Non-pressure chronic ulcer of unspecified part of left lower leg with unspecified severity: Secondary | ICD-10-CM | POA: Diagnosis not present

## 2020-03-20 DIAGNOSIS — I252 Old myocardial infarction: Secondary | ICD-10-CM | POA: Diagnosis not present

## 2020-03-20 DIAGNOSIS — J449 Chronic obstructive pulmonary disease, unspecified: Secondary | ICD-10-CM | POA: Diagnosis not present

## 2020-03-20 DIAGNOSIS — M170 Bilateral primary osteoarthritis of knee: Secondary | ICD-10-CM | POA: Diagnosis not present

## 2020-03-20 DIAGNOSIS — I5032 Chronic diastolic (congestive) heart failure: Secondary | ICD-10-CM | POA: Diagnosis not present

## 2020-03-20 DIAGNOSIS — G4733 Obstructive sleep apnea (adult) (pediatric): Secondary | ICD-10-CM | POA: Diagnosis not present

## 2020-03-20 DIAGNOSIS — I872 Venous insufficiency (chronic) (peripheral): Secondary | ICD-10-CM | POA: Diagnosis not present

## 2020-03-21 DIAGNOSIS — L97811 Non-pressure chronic ulcer of other part of right lower leg limited to breakdown of skin: Secondary | ICD-10-CM

## 2020-03-21 DIAGNOSIS — E1151 Type 2 diabetes mellitus with diabetic peripheral angiopathy without gangrene: Secondary | ICD-10-CM

## 2020-03-22 ENCOUNTER — Ambulatory Visit (INDEPENDENT_AMBULATORY_CARE_PROVIDER_SITE_OTHER): Payer: Medicare Other

## 2020-03-22 ENCOUNTER — Other Ambulatory Visit: Payer: Self-pay

## 2020-03-22 VITALS — BP 128/68 | HR 72 | Temp 97.8°F | Resp 16 | Ht 67.0 in | Wt 255.0 lb

## 2020-03-22 DIAGNOSIS — Z0000 Encounter for general adult medical examination without abnormal findings: Secondary | ICD-10-CM

## 2020-03-22 NOTE — Progress Notes (Signed)
Subjective:   ARHAN MCMANAMON is a 69 y.o. male who presents for Medicare Annual/Subsequent preventive examination.  This wellness visit is conducted by a nurse.  The patient's medications were reviewed and reconciled since the patient's last visit.  History details were provided by the patient.  The history appears to be reliable.    Patient's last AWV was two years ago.   Medical History: Patient history and Family history was reviewed  Medications, Allergies, and preventative health maintenance was reviewed and updated.  Mr Shieh spoke about his living situation during his visit.  He lives with his step-daughter, her family (including multiple children), and some of their friends.  He states that there are piles of clutter throughout the house making it impossible for him to move freely with his walker.  He asks for help from the family members to move their stuff so he can get through and does not get it.  He voiced that he feels certain that he would not be able to evacuate if there was to be a fire.  He states that the hallway to his bedroom has multiple piles of animal feces that nobody cleans up.  He states that he stays in his bedroom with the door closed so that the animals cannot come in there.  Mr Luhman states that he falls frequently in the bathroom getting in/out of the shower because there is nothing to hold on to.  He states that he has bars around his commode however the commode is not stable and moves when he sits on it.  He states that he has asked multiple times for his family to fix it.  [name] states that he would love to leave that living situation however feels they won't let him because they are living off his money.  He states that his step-daughter goes to the bank and gets his money out to use.   Review of Systems    Review of Systems  Constitutional: Negative.   HENT: Negative.   Eyes: Negative.   Respiratory: Positive for cough. Negative for chest tightness and  shortness of breath.   Cardiovascular: Negative.  Negative for chest pain and palpitations.  Musculoskeletal: Positive for gait problem.  Neurological: Positive for weakness. Negative for dizziness, light-headedness and headaches.  Psychiatric/Behavioral: Positive for dysphoric mood. Negative for sleep disturbance and suicidal ideas.   Cardiac Risk Factors include: advanced age (>25mn, >>18women);diabetes mellitus;sedentary lifestyle;obesity (BMI >30kg/m2);male gender     Objective:    Today's Vitals   03/22/20 1448  BP: 128/68  Pulse: 72  Resp: 16  Temp: 97.8 F (36.6 C)  TempSrc: Temporal  Weight: (!) 255 lb (115.7 kg)  Height: _0  (1.702 m)  PainSc: 2   PainLoc: Hand   Body mass index is 39.94 kg/m.  Advanced Directives 03/22/2020 01/21/2019 10/11/2018 10/08/2018 09/28/2018 09/03/2018 09/02/2018  Does Patient Have a Medical Advance Directive? Yes No _1   Type of Advance Directive Out of facility DNR (pink MOST or yellow form) - [place] will [place] Living will [place] Does patient want to make changes to medical advance directive? - - No - Patient declined - No - Patient declined No - Patient declined -  Copy of HRadissonin Chart? - - No - copy requested - - - No - copy requested  Would patient like information on creating a medical advance directive? - No -  Patient declined - - - - -  Pre-existing out of facility DNR order (yellow form or pink MOST form) - - - - - - -    Current Medications (verified) Outpatient Encounter Medications as of 03/22/2020  Medication Sig  . albuterol (PROVENTIL) (2.5 MG/3ML) 0.083% nebulizer solution Take 3 mLs (2.5 mg total) by nebulization 2 (two) times daily. And as needed (Patient taking differently: Take 2.5 mg by nebulization in the morning, at noon, in the evening, and at bedtime. And as needed)  . atorvastatin (LIPITOR) 40 MG  tablet Take 40 mg by mouth daily.  . Azelastine HCl 0.15 % SOLN Place 2 sprays into both nostrils daily.   . BD PEN NEEDLE NANO U/F 32G X 4 MM MISC   . clopidogrel (PLAVIX) 75 MG tablet Take 1 tablet (75 mg total) by mouth daily.  Marland Kitchen docusate sodium (COLACE) 100 MG capsule Take 100 mg by mouth 2 (two) times daily.  . famotidine (PEPCID) 20 MG tablet TAKE 2 TABLETS BY MOUTH TWICE DAILY  . ferrous sulfate 325 (65 FE) MG tablet Take 325 mg by mouth daily.  . Fluticasone Furoate-Vilanterol (BREO ELLIPTA IN) Inhale 200 mcg into the lungs daily.   . Glucagon, rDNA, (GLUCAGON EMERGENCY) 1 MG KIT   . hydrALAZINE (APRESOLINE) 100 MG tablet Take 1 tablet (100 mg total) by mouth 2 (two) times daily. (Patient taking differently: Take 100 mg by mouth 3 (three) times daily. )  . insulin detemir (LEVEMIR FLEXTOUCH) 100 UNIT/ML FlexPen Inject 65 Units into the skin daily. (Patient taking differently: Inject 40 Units into the skin daily. )  . isosorbide mononitrate (IMDUR) 120 MG 24 hr tablet TAKE 1 TABLET BY MOUTH ONCE DAILY IN THEMORNINGS  . metolazone (ZAROXOLYN) 2.5 MG tablet Take 1 tablet (2.5 mg total) by mouth 2 (two) times a week. (Patient taking differently: Take 2.5 mg by mouth daily. )  . metoprolol succinate (TOPROL-XL) 50 MG 24 hr tablet TAKE 1 TABLET BY MOUTH ONCE DAILY  . MOVANTIK 25 MG TABS tablet SMARTSIG:1 Tablet(s) By Mouth Daily  . nitroGLYCERIN (NITROSTAT) 0.4 MG SL tablet Place 0.4 mg under the tongue every 5 (five) minutes as needed for chest pain.   . OXYGEN Inhale into the lungs. 2 liters at bedtime  . pantoprazole (PROTONIX) 40 MG tablet Take 40 mg by mouth in the morning.   . Potassium Chloride ER 20 MEQ TBCR Take 40 mEq by mouth 2 (two) times daily.   . pregabalin (LYRICA) 75 MG capsule Take 1 capsule (75 mg total) by mouth 2 (two) times daily.  Marland Kitchen RELISTOR 150 MG TABS TAKE 3 TABLETS BY MOUTH EVERY MORNING  . Semaglutide (OZEMPIC, 0.25 OR 0.5 MG/DOSE, [place]) Inject 0.25 mg into the skin  once a week.  . torsemide (DEMADEX) 20 MG tablet Take 3 tablets (60 mg total) by mouth 2 (two) times daily.  . Vitamin D, Ergocalciferol, (DRISDOL) 1.25 MG (50000 UNIT) CAPS capsule TAKE 1 CAPSULE BY MOUTH DAILY  . XARELTO 15 MG TABS tablet TAKE 1 TABLET BY MOUTH ONCE DAILY WITH EVENING MEAL (Patient taking differently: Take 15 mg by mouth daily after supper. )  . XTAMPZA ER 27 MG C12A TAKE 1 CAPSULE BY MOUTH TWICE DAILY   No facility-administered encounter medications on file as of 03/22/2020.    Allergies (verified) Hydrocodone, Morphine, Duloxetine hcl, Codeine, and Penicillins   History: Past Medical History:  Diagnosis Date  . Abnormal EKG 11/29/2014  . Acute renal insufficiency 11/29/2014  . Angina   .  Arthritis    "knees" (01/18/2014)  . Asthma   . Atrial fibrillation ([place])   . Back pain 01/25/2015  . 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 BOOP (bronchiolitis obliterans with organizing pneumonia) ([place]) 01/20/2014   OLBx 01/20/14 BOOP Started steroids 02/28/14   . Chronic anticoagulation-Xarelto 02/09/2014  . Chronic atrial fibrillation ([place]) 01/03/2014  . Chronic bronchitis ([place]) 12/21/2013  . Chronic diastolic heart failure ([place]) 02/22/2015  . Chronic pain of both knees 03/18/2017   Overview:  Added automatically from request for surgery [id number]  . Chronic respiratory failure ([place]) 04/10/2015  . COPD (chronic obstructive pulmonary disease) ([place]) 01/25/2015  . Cough    coughing up blood- started last nite, seems to be worse now  . CVA (cerebral vascular accident) ([place]) 02/09/2014  . Dysrhythmia    atrial fib takes cardizem,   . Fibromyalgia   . GERD (gastroesophageal reflux disease)   . H/O hiatal hernia   . Hemoptysis 08/01/2015  . Herpes zoster 06/26/2015  . History of stroke   . Hyperkalemia-repeat pending 11/29/2014  . Hyperlipemia   . Hyperlipidemia   . Hypertension   . Hypothyroidism   . Long term (current) use of anticoagulants 03/21/2014  . Mild CAD 02/20/2015   Overview:   On recent cardiac cath  Overview:  On recent cardiac cath  . Myocardial infarction [facility])    "one dr says yes; another says no"  . Neuromuscular disorder (HCC)    rls, neuropathy  . Neuropathy    rls, neuropathy   . Normal coronary arteries 2011 11/29/2014  . Obesity (BMI 30-39.9)   . On home oxygen therapy    "2L w/CPAP at bedtime" (09/01/2018)  . OSA on CPAP    cpap & oxygen  . Pericardial effusion   . Peritoneal effusion, chronic 02/22/2015   Overview:  With surgical window  . PNA (pneumonia) 04/10/2015  . Pneumonia 5/15   "several times" (01/18/2014)  . Precordial pain 01/26/2015  . Preoperative cardiovascular examination 02/24/2016  . Primary osteoarthritis of both knees 03/18/2017   Overview:  Added automatically from request for surgery [id number]  . Restless leg syndrome   . Sleep apnea    cpap & oxygen   . Stroke [facility]) 2012   denies residual on 01/18/2014  . Troponin level elevated 11/29/2014  . Uncontrolled type 2 diabetes mellitus with diabetic polyneuropathy, with long-term current use of insulin ([place]) 12/21/2013   Past Surgical History:  Procedure Laterality Date  . ABDOMINAL AORTOGRAM W/LOWER EXTREMITY N/A 09/08/2018   Procedure: ABDOMINAL AORTOGRAM W/LOWER EXTREMITY;  Surgeon: Marty Heck, MD;  Location: [place] CV LAB;  Service: Cardiovascular;  Laterality: N/A;  . AMPUTATION Right 09/09/2018   Procedure: AMPUTATION RIGHT GREAT TOE;  Surgeon: Waynetta Sandy, MD;  Location: [place];  Service: Vascular;  Laterality: Right;  . APPENDECTOMY    . CARDIAC CATHETERIZATION  X 2 then 03/03/2012    NL LVF, normal coronaries, vessels are small (HPR: Dr. Beatrix Fetters)  . CATARACT EXTRACTION W/ INTRAOCULAR LENS  IMPLANT, BILATERAL Bilateral   . CHOLECYSTECTOMY    . HERNIA REPAIR     UHR  . LAPAROSCOPIC GASTRIC BANDING  2010  . LOWER EXTREMITY ANGIOGRAPHY Left 10/11/2018   Procedure: LOWER EXTREMITY ANGIOGRAPHY;  Surgeon: Waynetta Sandy, MD;  Location: [place]  CV LAB;  Service: Cardiovascular;  Laterality: Left;  . PERICARDIAL WINDOW Left 01/24/2014   Procedure: PERICARDIAL WINDOW;  Surgeon: Gaye Pollack, MD;  Location: [place];  Service: Thoracic;  Laterality:  Left;  . PERIPHERAL VASCULAR INTERVENTION Right 09/08/2018   Procedure: PERIPHERAL VASCULAR INTERVENTION;  Surgeon: Marty Heck, MD;  Location: [place] CV LAB;  Service: Cardiovascular;  Laterality: Right;  . SINUS EXPLORATION  X 2  . TEE WITHOUT CARDIOVERSION N/A 09/07/2018   Procedure: TRANSESOPHAGEAL ECHOCARDIOGRAM (TEE);  Surgeon: Acie Fredrickson Wonda Cheng, MD;  Location: [place];  Service: Cardiovascular;  Laterality: N/A;  . UMBILICAL HERNIA REPAIR    . VIDEO ASSISTED THORACOSCOPY Left 01/24/2014   Procedure: VIDEO ASSISTED THORACOSCOPY;  Surgeon: Gaye Pollack, MD;  [facility];  Service: Thoracic;  Laterality: Left;  VATS/open lung biopsy  . VIDEO BRONCHOSCOPY Bilateral 12/29/2013   Procedure: VIDEO BRONCHOSCOPY WITHOUT FLUORO;  Surgeon: Elsie Stain, MD;  Location: [facility];  Service: Endoscopy;  Laterality: Bilateral;  . VIDEO BRONCHOSCOPY N/A 01/24/2014   Procedure: VIDEO BRONCHOSCOPY;  Surgeon: Gaye Pollack, MD;  Location: [facility] OR;  Service: Thoracic;  Laterality: N/A;   Family History  Problem Relation Age of Onset  . Cancer Mother   . Stroke Father   . Heart disease Father   . Seizures Sister   . Diabetes Sister   . Anesthesia problems Neg Hx   . Hypotension Neg Hx   . Malignant hyperthermia Neg Hx   . Pseudochol deficiency Neg Hx    Social History   Socioeconomic History  . Marital status: Widowed    Spouse name: Not on file  . Number of children: Not on file  . Years of education: Not on file  . Highest education level: Not on file  Occupational History  . Not on file  Tobacco Use  . Smoking status: Former Smoker    Types: Cigars    Quit date: 09/01/2006    Years since quitting: 13.5  . Smokeless tobacco: Never Used  Vaping Use  . Vaping Use:  Never used  Substance and Sexual Activity  . 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 Drug use: No  . Sexual activity: Never  Other Topics Concern  . Not on file  Social History Narrative  . Not on file   Social Determinants of Health   Financial Resource Strain:   . Difficulty of Paying Living Expenses:   Food Insecurity:   . Worried About Charity fundraiser in the Last Year:   . Arboriculturist in the Last Year:   Transportation Needs: No Transportation Needs  . Lack of Transportation (Medical): No  . Lack of Transportation (Non-Medical): No  Physical Activity: Inactive  . Days of Exercise per Week: 0 days  . Minutes of Exercise per Session: 0 min  Stress:   . Feeling of Stress :   Social Connections:   . Frequency of Communication with Friends and Family:   . Frequency of Social Gatherings with Friends and Family:   . Attends Religious Services:   . Active Member of Clubs or Organizations:   . Attends Archivist Meetings:   Marland Kitchen Marital Status:     Tobacco Counseling Counseling given: Not Answered   Clinical Intake:  Pre-visit preparation completed: Yes  Pain : 0-10 Pain Score: 2  Pain Type: Acute pain Pain Location: Hand Pain Orientation: Right Pain Descriptors / Indicators: Aching (Pain in right hand, wrist, and arm from fall) Pain Frequency: Intermittent     BMI - recorded: 39.94 Nutritional Status: BMI > 30  Obese Nutritional Risks: Other (Comment) (unpredictable meals)  Diabetes: Yes CBG done?: No Did pt. bring in CBG monitor from home?: No  How often do you need to have someone help you when you read instructions, pamphlets, or other written materials from your doctor or pharmacy?: 3 - Sometimes  Interpreter Needed?: No      Activities of Daily Living In your present state of health, do you have any difficulty performing the following activities: 03/22/2020 02/16/2020  Hearing? N N  Vision? N N    Difficulty concentrating or making decisions? N N  Walking or climbing stairs? Tempie Donning  Comment uses assistive devices uses a cane  Dressing or bathing? N N  Doing errands, shopping? N N  Preparing Food and eating ? Y -  Using the Toilet? N -  In the past six months, have you accidently leaked urine? N -  Do you have problems with loss of bowel control? N -  Managing your Medications? N -  Managing your Finances? N -  Housekeeping or managing your Housekeeping? N -  Some recent data might be hidden    Patient Care Team: Rochel Brome, MD as PCP - General (Family Medicine) Sharon Mt, MD (Inactive) Hilty, Nadean Corwin, MD as Consulting Physician (Cardiology) Elsie Stain, MD as Consulting Physician (Pulmonary Disease) Corliss Parish, MD as Consulting Physician (Nephrology) Burnice Logan, [facility] as Pharmacist (Pharmacist)  Indicate any recent Medical Services you may have received from other than [facility] providers in the past year (date may be approximate).     Assessment:   This is a routine wellness examination for [place].  Hearing/Vision screen No exam data present  Dietary issues and exercise activities discussed: Current Exercise Habits: The patient does not participate in regular exercise at present, Exercise limited by: orthopedic condition(s)  Goals    . Pharmacy Care Plan     CARE PLAN ENTRY  Current Barriers:  . Chronic Disease Management support, education, and care coordination needs related to Hypertension and Diabetes   Hypertension . Pharmacist Clinical Goal(s): o Over the next 90 days, patient will work with PharmD and providers to achieve BP goal <130/80 . Current regimen:  o hydralazine 100 mg tid o isosorbide mn 120 mg daily o metolazone 2.5 mg daily o torsemide 60 mg bid . Interventions: o Pharmacist helped patient reconcile blood pressure medications after recent trip to emergency room.  . Patient self care activities - Over the next 90 days,  patient will: o Check BP daily, document, and provide at future appointments o Ensure daily salt intake < 2300 mg/day  Hyperlipidemia . Pharmacist Clinical Goal(s): o Over the next 90 days, patient will work with PharmD and providers to maintain LDL goal < 70 . Current regimen:  o Atorvastatin 40 mg daily . Interventions: o Recommend patient continue taking medication as prescribed.  . Patient self care activities - Over the next 90 days, patient will: o Take medication as prescribed   Diabetes . Pharmacist Clinical Goal(s): o Over the next 90 days, patient will work with PharmD and providers to achieve A1c goal <7% . Current regimen:  o glucagon kit o levemir flextouch 45 units daily o Ozempic 0.25 mg weekly . Interventions: o Recommend patient decrease Levemir to 45 units qhs to avoid low fasting blood sugars.  o Recommend patient continue taking Ozempic 0.25 mg weekly until sees Dr. Holly Bodily and consider increasing to 0.5 mg weekly thereafter.  . Patient self care activities - Over the next 90 days, patient will: o Check blood  sugar twice daily, document, and provide at future appointments o Contact provider with any episodes of hypoglycemia  Medication management . Pharmacist Clinical Goal(s): o Over the next 90 days, patient will work with PharmD and providers to achieve optimal medication adherence . Current pharmacy: [facility] . Interventions o Comprehensive medication review performed. o Continue current medication management strategy . Patient self care activities - Over the next 90 days, patient will: o Focus on medication adherence by using pill box o Take medications as prescribed o Report any questions or concerns to PharmD and/or provider(s)  Please see past updates related to this goal by clicking on the "Past Updates" button in the selected goal        Depression Screen PHQ 2/9 Scores 03/22/2020 02/16/2020 02/10/2020 11/11/2019 07/31/2017 04/13/2015  03/13/2015  PHQ - 2 Score 4 0 6 0 - 0 0  PHQ- 9 Score 5 0 12 - - - -  Exception Documentation - - - - Other- indicate reason in comment box Patient refusal -  Not completed - - - - Patient was ready to get off the phone. He stated, "He was just getting out of the bed".  - -    Fall Risk Fall Risk  03/22/2020 10/12/2019 07/31/2017 04/13/2015 03/28/2015  Falls in the past year? 1 1 Yes No No  Number falls in past yr: _0 or more - -  Injury with Fall? 1 0 Yes - -  Risk Factor Category  - - High Fall Risk - -  Risk for fall due to : History of fall(s);Impaired balance/gait;Other (Comment) History of fall(s);Impaired mobility History of fall(s) - -  Risk for fall due to: Comment reports excess clutter in home - - - -  Follow up Falls evaluation completed;Education provided;Falls prevention discussed Falls prevention discussed;Falls evaluation completed Falls evaluation completed - -    Any stairs in or around the home? Yes  If so, are there any without handrails? No  Home free of loose throw rugs in walkways, pet beds, electrical cords, etc? No  Adequate lighting in your home to reduce risk of falls? Yes   ASSISTIVE DEVICES UTILIZED TO PREVENT FALLS:  Life alert? No  Use of a cane, walker or w/c? Yes  Grab bars in the bathroom? No  Shower chair or bench in shower? Yes  Elevated toilet seat or a handicapped toilet? No    Cognitive Function: MMSE - Mini Mental State Exam 03/14/2020  Orientation to time 5  Orientation to Place 5  Registration 3  Attention/ Calculation 1  Recall 2  Language- name 2 objects 2  Language- repeat 0  Language- follow 3 step command 3  Language- read & follow direction 1  Write a sentence 1  Copy design 1  Total score 24     6CIT Screen 03/22/2020  What Year? 0 points  What month? 0 points  What time? 0 points  Count back from 20 0 points  Months in reverse 4 points  Repeat phrase 4 points  Total Score 8    Immunizations Immunization History    Administered Date(s) Administered  . Influenza Split 10/22/2013  . Influenza,inj,Quad PF,6+ Mos 06/11/2015, 06/01/2016  . PFIZER SARS-COV-2 Vaccination 12/13/2019, 01/13/2020  . Pneumococcal Conjugate-13 06/03/2016  . Pneumococcal-Unspecified 11/30/2013  . Tdap 08/30/2012    TDAP status: Up to date Flu Vaccine status: Up to date Pneumococcal vaccine status: Up to date Covid-19 vaccine status: Completed vaccines  Screening Tests Health Maintenance  Topic Date  Due  . OPHTHALMOLOGY EXAM  05/31/2019  . TETANUS/TDAP  08/30/2022  . COLONOSCOPY  Due - Declined  . PNA vac Low Risk Adult (2 of 2 - PCV13) Completed  . FOOT EXAM  02/28/2016  . INFLUENZA VACCINE  04/01/2020  . HEMOGLOBIN A1C  06/13/2020  . COVID-19 Vaccine  Completed    Health Maintenance  Health Maintenance Due  Topic Date Due  . Hepatitis C Screening  Never done  . FOOT EXAM  02/28/2016    Colorectal Cancer Screening: Declined  Lung Cancer Screening: (Low Dose CT Chest recommended if Age 67-80 years, 30 pack-year currently smoking OR have quit w/in 15years.) does not qualify.   Additional Screening:  Vision Screening: Recommended annual ophthalmology exams for early detection of glaucoma and other disorders of the eye. Is the patient up to date with their annual eye exam?  Yes  Who is the provider or what is the name of the office in which the patient attends annual eye exams? [facility]  Dental Screening: Recommended annual dental exams for proper oral hygiene    Plan:   Counseling was provided today regarding the following topics: healthy eating habits, home safety, vitamin and mineral supplementation (Multivitiman), regular exercise, tobacco avoidance, limitation of alcohol intake, use of seat belts, firearm safety, and fall prevention.  Annual recommendations include: influenza vaccine, dental cleanings, and eye exams.  I will look into getting a Education officer, museum and public housing to try to get  him into better living conditions.  I have personally reviewed and noted the following in the patient's chart:   . Medical and social history . Use of alcohol, tobacco or illicit drugs  . Current medications and supplements . Functional ability and status . Nutritional status . Physical activity . Advanced directives . List of other physicians . Hospitalizations, surgeries, and ER visits in previous 12 months . Vitals . Screenings to include cognitive, depression, and falls . Referrals and appointments  In addition, I have reviewed and discussed with patient certain preventive protocols, quality metrics, and best practice recommendations. A written personalized care plan for preventive services as well as general preventive health recommendations were provided to patient.     Erie Noe, LPN   01/27/4131

## 2020-03-22 NOTE — Patient Instructions (Addendum)
 Fall Prevention in the Home, Adult Falls can cause injuries. They can happen to people of all ages. There are many things you can do to make your home safe and to help prevent falls. Ask for help when making these changes, if needed. What actions can I take to prevent falls? General Instructions  Use good lighting in all rooms. Replace any light bulbs that burn out.  Turn on the lights when you go into a dark area. Use night-lights.  Keep items that you use often in easy-to-reach places. Lower the shelves around your home if necessary.  Set up your furniture so you have a clear path. Avoid moving your furniture around.  Do not have throw rugs and other things on the floor that can make you trip.  Avoid walking on wet floors.  If any of your floors are uneven, fix them.  Add color or contrast paint or tape to clearly mark and help you see: ? Any grab bars or handrails. ? First and last steps of stairways. ? Where the edge of each step is.  If you use a stepladder: ? Make sure that it is fully opened. Do not climb a closed stepladder. ? Make sure that both sides of the stepladder are locked into place. ? Ask someone to hold the stepladder for you while you use it.  If there are any pets around you, be aware of where they are. What can I do in the bathroom?      Keep the floor dry. Clean up any water that spills onto the floor as soon as it happens.  Remove soap buildup in the tub or shower regularly.  Use non-skid mats or decals on the floor of the tub or shower.  Attach bath mats securely with double-sided, non-slip rug tape.  If you need to sit down in the shower, use a plastic, non-slip stool.  Install grab bars by the toilet and in the tub and shower. Do not use towel bars as grab bars. What can I do in the bedroom?  Make sure that you have a light by your bed that is easy to reach.  Do not use any sheets or blankets that are too big for your bed. They should  not hang down onto the floor.  Have a firm chair that has side arms. You can use this for support while you get dressed. What can I do in the kitchen?  Clean up any spills right away.  If you need to reach something above you, use a strong step stool that has a grab bar.  Keep electrical cords out of the way.  Do not use floor polish or wax that makes floors slippery. If you must use wax, use non-skid floor wax. What can I do with my stairs?  Do not leave any items on the stairs.  Make sure that you have a light switch at the top of the stairs and the bottom of the stairs. If you do not have them, ask someone to add them for you.  Make sure that there are handrails on both sides of the stairs, and use them. Fix handrails that are broken or loose. Make sure that handrails are as long as the stairways.  Install non-slip stair treads on all stairs in your home.  Avoid having throw rugs at the top or bottom of the stairs. If you do have throw rugs, attach them to the floor with carpet tape.  Choose a carpet that   does not hide the edge of the steps on the stairway.  Check any carpeting to make sure that it is firmly attached to the stairs. Fix any carpet that is loose or worn. What can I do on the outside of my home?  Use bright outdoor lighting.  Regularly fix the edges of walkways and driveways and fix any cracks.  Remove anything that might make you trip as you walk through a door, such as a raised step or threshold.  Trim any bushes or trees on the path to your home.  Regularly check to see if handrails are loose or broken. Make sure that both sides of any steps have handrails.  Install guardrails along the edges of any raised decks and porches.  Clear walking paths of anything that might make someone trip, such as tools or rocks.  Have any leaves, snow, or ice cleared regularly.  Use sand or salt on walking paths during winter.  Clean up any spills in your garage right  away. This includes grease or oil spills. What other actions can I take?  Wear shoes that: ? Have a low heel. Do not wear high heels. ? Have rubber bottoms. ? Are comfortable and fit you well. ? Are closed at the toe. Do not wear open-toe sandals.  Use tools that help you move around (mobility aids) if they are needed. These include: ? Canes. ? Walkers. ? Scooters. ? Crutches.  Review your medicines with your doctor. Some medicines can make you feel dizzy. This can increase your chance of falling. Ask your doctor what other things you can do to help prevent falls. Where to find more information  Centers for Disease Control and Prevention, [employer]: https://cdc.gov  National Institute on Aging: https://go4life.nia.nih.gov Contact a doctor if:  You are afraid of falling at home.  You feel weak, drowsy, or dizzy at home.  You fall at home. Summary  There are many simple things that you can do to make your home safe and to help prevent falls.  Ways to make your home safe include removing tripping hazards and installing grab bars in the bathroom.  Ask for help when making these changes in your home. This information is not intended to replace advice given to you by your health care provider. Make sure you discuss any questions you have with your health care provider. Document Revised: 12/09/2018 Document Reviewed: 04/02/2017 Elsevier Patient Education  2020 Elsevier Inc.   Health Maintenance, Male Adopting a healthy lifestyle and getting preventive care are important in promoting health and wellness. Ask your health care provider about:  The right schedule for you to have regular tests and exams.  Things you can do on your own to prevent diseases and keep yourself healthy. What should I know about diet, weight, and exercise? Eat a healthy diet   Eat a diet that includes plenty of vegetables, fruits, low-fat dairy products, and lean protein.  Do not eat a lot of foods  that are high in solid fats, added sugars, or sodium. Maintain a healthy weight Body mass index (BMI) is a measurement that can be used to identify possible weight problems. It estimates body fat based on height and weight. Your health care provider can help determine your BMI and help you achieve or maintain a healthy weight. Get regular exercise Get regular exercise. This is one of the most important things you can do for your health. Most adults should:  Exercise for at least 150 minutes each week. The   exercise should increase your heart rate and make you sweat (moderate-intensity exercise).  Do strengthening exercises at least twice a week. This is in addition to the moderate-intensity exercise.  Spend less time sitting. Even light physical activity can be beneficial. Watch cholesterol and blood lipids Have your blood tested for lipids and cholesterol at 71 years of age, then have this test every 5 years. You may need to have your cholesterol levels checked more often if:  Your lipid or cholesterol levels are high.  You are older than 71 years of age.  You are at high risk for heart disease. What should I know about cancer screening? Many types of cancers can be detected early and may often be prevented. Depending on your health history and family history, you may need to have cancer screening at various ages. This may include screening for:  Colorectal cancer.  Prostate cancer.  Skin cancer.  Lung cancer. What should I know about heart disease, diabetes, and high blood pressure? Blood pressure and heart disease  High blood pressure causes heart disease and increases the risk of stroke. This is more likely to develop in people who have high blood pressure readings, are of African descent, or are overweight.  Talk with your health care provider about your target blood pressure readings.  Have your blood pressure checked: ? Every 3-5 years if you are 18-39 years of  age. ? Every year if you are 40 years old or older.  If you are between the ages of 65 and 75 and are a current or former smoker, ask your health care provider if you should have a one-time screening for abdominal aortic aneurysm (AAA). Diabetes Have regular diabetes screenings. This checks your fasting blood sugar level. Have the screening done:  Once every three years after age 45 if you are at a normal weight and have a low risk for diabetes.  More often and at a younger age if you are overweight or have a high risk for diabetes. What should I know about preventing infection? Hepatitis B If you have a higher risk for hepatitis B, you should be screened for this virus. Talk with your health care provider to find out if you are at risk for hepatitis B infection. Hepatitis C Blood testing is recommended for:  Everyone born from 1945 through 1965.  Anyone with known risk factors for hepatitis C. Sexually transmitted infections (STIs)  You should be screened each year for STIs, including gonorrhea and chlamydia, if: ? You are sexually active and are younger than 71 years of age. ? You are older than 71 years of age and your health care provider tells you that you are at risk for this type of infection. ? Your sexual activity has changed since you were last screened, and you are at increased risk for chlamydia or gonorrhea. Ask your health care provider if you are at risk.  Ask your health care provider about whether you are at high risk for HIV. Your health care provider may recommend a prescription medicine to help prevent HIV infection. If you choose to take medicine to prevent HIV, you should first get tested for HIV. You should then be tested every 3 months for as long as you are taking the medicine. Follow these instructions at home: Lifestyle  Do not use any products that contain nicotine or tobacco, such as cigarettes, e-cigarettes, and chewing tobacco. If you need help quitting,  ask your health care provider.  Do not use street   drugs.  Do not share needles.  Ask your health care provider for help if you need support or information about quitting drugs. Alcohol use  Do not drink alcohol if your health care provider tells you not to drink.  If you drink alcohol: ? Limit how much you have to 0-2 drinks a day. ? Be aware of how much alcohol is in your drink. In the [place] one drink equals one 12 oz bottle of beer (355 mL), one 5 oz glass of wine (148 mL), or one 1 oz glass of hard liquor (44 mL). General instructions  Schedule regular health, dental, and eye exams.  Stay current with your vaccines.  Tell your health care provider if: ? You often feel depressed. ? You have ever been abused or do not feel safe at home. Summary  Adopting a healthy lifestyle and getting preventive care are important in promoting health and wellness.  Follow your health care provider's instructions about healthy diet, exercising, and getting tested or screened for diseases.  Follow your health care provider's instructions on monitoring your cholesterol and blood pressure. This information is not intended to replace advice given to you by your health care provider. Make sure you discuss any questions you have with your health care provider. Document Revised: 08/11/2018 Document Reviewed: 08/11/2018 Elsevier Patient Education  [place].  Elder Abuse and Neglect Elder abuse and neglect refers to any way in which someone physically or emotionally harms an older person or takes advantage of him or her. It also includes neglecting to protect an older person from physical, financial, or emotional harm. What are the different kinds of abuse and neglect?  Physical abuse. This includes rough handling, threats with a weapon, throwing objects, pushing, hitting, force-feeding, and improper use of restraints or medicines.  Sexual abuse. This involves unwanted sexual contact that  is forced or tricked.  Emotional abuse. This includes verbal attacks, rejection, humiliation, intimidation, social isolation, or threats. Abuse may also include denying the victim participation in decisions that affect his or her life.  Financial abuse. This includes theft, fraud, and improper use of influence to gain control over money or property. Financial abuse is the most common form of elder abuse.  Neglect. This is a caretaker's failure or refusal to meet the needs of an older person. This can include failure or refusal to provide food, shelter, clothing, personal hygiene, medical care, and social stimulation. What are the risk factors for abuse and neglect? Elder abuse is more likely to occur if a person:  Is alone, isolated, or lonely.  Has experienced a recent loss.  Is mentally or physically disabled.  Cannot manage his or her personal care or finances.  Has contact with someone who abuses alcohol or drugs.  Is over 28 years old. The frequency of abuse increases with advancing age.  Lives at a care facility with a shortage of beds, too many patients, or not enough staff. Abuse may also occur with caregivers who:  Are under great stress. This may be due to financial, marital, or work problems, a recent decline in health, or loss of a loved one.  Have a history of mental illness, such as depression, or mental disability.  Abuse alcohol or other drugs. How do I know if someone is being abused or neglected? Someone may be going through abuse or neglect if the person:  Has physical signs of abuse, such as: ? Burns. ? Scars. ? Bruises. ? Broken bones. ? Sores. ? Rashes. ?  Weight loss. ? Injury to the genitals. ? Difficulty walking.  Does not have necessary medical and personal items such as medicines, hearing aids, or eyeglasses.  Shows behavioral signs of abuse or neglect. The older person may: ? Move away from or seem afraid of a caregiver. ? Withdraw  socially. ? Become violent or agitated. ? Seem depressed. ? Have nightmares or difficulty sleeping.  Lives in an unhealthy or unsafe environment.  Shows signs of not being cared for, such as poor hygiene or dirty clothes. How can elder abuse be prevented? If you are a family member or a friend:  Try to see the person regularly. Frequent visits will help you determine if the person shows signs of abuse or neglect.  Get to know the staff who are taking care of the person.  Express any concerns you may have right away. Keep notes of the conversations.  Take pictures if there are signs of abuse or poor health standards.  Offer to accompany the person to medical appointments, financial institutions, and other important meetings. If you are an older person:  Remain active, avoid isolation, and cultivate a strong support network of family and friends.  Be active in taking care of your health problems. Do what you can to remain healthy.  Refuse to allow anyone, even close relatives, to add his or her name to your bank accounts without your clear consent.  Avoid signing documents or transferring money or property to anyone without first getting legal advice.  Ask for professional help right away if you need help managing your health care, daily living, or finances.  Assert your right to be treated with respect and dignity.  Ask a family member or friend you trust to represent you as power of attorney in the event that you are not able to.  Outline your wishes with advance directives or a living will for medical decisions. What should I do if I think someone is being abused or neglected? If you believe that you or someone you know is in immediate danger, call your local emergency services (911 in the [place] If you or someone you know is being abused or neglected, contact:  Adult Protective Services (APS) in the [place]  Any health care provider.  The police.  An ombudsman. This is a  person or service you can contact to discuss neglect or abuse claims in long-term care.  Medicaid and Medicare fraud units. These teams check into cases of health, medical, and financial abuse or neglect. Seek help right away if:  You are being hit.  You are being verbally or emotionally abused.  You go without food for long periods of time. Also, seek help right away if an older adult you know:  Shows signs of not being fed.  Is not receiving needed medical care.  Is being physically or emotionally abused. What are the treatments for someone who is abused or neglected? Treatment depends on the type of abuse or neglect. It may include:  Medical treatment for any injuries.  Being seen by a health care provider if there are memory problems (dementia).  Support groups if the older person seems lonely, anxious, or depressed.  Counseling for depression, anxiety, and other mental health related issues.  Financial guidance to help manage money or property. Where can I get more information?  [place] on Elder Abuse: DoggyTherapist.cz  National Eldercare Locator: (937)251-7547 or www.eldercare.[place] for [employer] of Elder Abuse: www.preventelderabuse.org Summary  Elder abuse and  neglect refers to any way in which someone physically or emotionally harms an older person or takes advantage of him or her.  The signs of abuse include burns, broken bones, weight loss, bruises, confusion, fear of caregivers, and worsening of physical and mental health.  The best way to prevent this abuse is to have frequent contact with the adult. Express any concerns you may have right away.  If you or someone you know is a victim of abuse or neglect, seek help right away. This information is not intended to replace advice given to you by your health care provider. Make sure you discuss any questions you have with your health care provider. Document Revised: 12/09/2018  Document Reviewed: 09/16/2017 Elsevier Patient Education  [place].

## 2020-03-23 DIAGNOSIS — I130 Hypertensive heart and chronic kidney disease with heart failure and stage 1 through stage 4 chronic kidney disease, or unspecified chronic kidney disease: Secondary | ICD-10-CM | POA: Diagnosis not present

## 2020-03-23 DIAGNOSIS — E1122 Type 2 diabetes mellitus with diabetic chronic kidney disease: Secondary | ICD-10-CM | POA: Diagnosis not present

## 2020-03-23 DIAGNOSIS — J449 Chronic obstructive pulmonary disease, unspecified: Secondary | ICD-10-CM | POA: Diagnosis not present

## 2020-03-23 DIAGNOSIS — I482 Chronic atrial fibrillation, unspecified: Secondary | ICD-10-CM | POA: Diagnosis not present

## 2020-03-23 DIAGNOSIS — I824Z2 Acute embolism and thrombosis of unspecified deep veins of left distal lower extremity: Secondary | ICD-10-CM | POA: Diagnosis not present

## 2020-03-23 DIAGNOSIS — J9620 Acute and chronic respiratory failure, unspecified whether with hypoxia or hypercapnia: Secondary | ICD-10-CM | POA: Diagnosis not present

## 2020-03-23 DIAGNOSIS — L97929 Non-pressure chronic ulcer of unspecified part of left lower leg with unspecified severity: Secondary | ICD-10-CM | POA: Diagnosis not present

## 2020-03-23 DIAGNOSIS — Z8673 Personal history of transient ischemic attack (TIA), and cerebral infarction without residual deficits: Secondary | ICD-10-CM | POA: Diagnosis not present

## 2020-03-23 DIAGNOSIS — I5032 Chronic diastolic (congestive) heart failure: Secondary | ICD-10-CM | POA: Diagnosis not present

## 2020-03-23 DIAGNOSIS — I25119 Atherosclerotic heart disease of native coronary artery with unspecified angina pectoris: Secondary | ICD-10-CM | POA: Diagnosis not present

## 2020-03-23 DIAGNOSIS — E039 Hypothyroidism, unspecified: Secondary | ICD-10-CM | POA: Diagnosis not present

## 2020-03-23 DIAGNOSIS — G2581 Restless legs syndrome: Secondary | ICD-10-CM | POA: Diagnosis not present

## 2020-03-23 DIAGNOSIS — E1142 Type 2 diabetes mellitus with diabetic polyneuropathy: Secondary | ICD-10-CM | POA: Diagnosis not present

## 2020-03-23 DIAGNOSIS — M170 Bilateral primary osteoarthritis of knee: Secondary | ICD-10-CM | POA: Diagnosis not present

## 2020-03-23 DIAGNOSIS — Z9981 Dependence on supplemental oxygen: Secondary | ICD-10-CM | POA: Diagnosis not present

## 2020-03-23 DIAGNOSIS — K219 Gastro-esophageal reflux disease without esophagitis: Secondary | ICD-10-CM | POA: Diagnosis not present

## 2020-03-23 DIAGNOSIS — N183 Chronic kidney disease, stage 3 unspecified: Secondary | ICD-10-CM | POA: Diagnosis not present

## 2020-03-23 DIAGNOSIS — M797 Fibromyalgia: Secondary | ICD-10-CM | POA: Diagnosis not present

## 2020-03-23 DIAGNOSIS — I252 Old myocardial infarction: Secondary | ICD-10-CM | POA: Diagnosis not present

## 2020-03-23 DIAGNOSIS — E785 Hyperlipidemia, unspecified: Secondary | ICD-10-CM | POA: Diagnosis not present

## 2020-03-23 DIAGNOSIS — G4733 Obstructive sleep apnea (adult) (pediatric): Secondary | ICD-10-CM | POA: Diagnosis not present

## 2020-03-23 DIAGNOSIS — L97811 Non-pressure chronic ulcer of other part of right lower leg limited to breakdown of skin: Secondary | ICD-10-CM | POA: Diagnosis not present

## 2020-03-23 DIAGNOSIS — E1151 Type 2 diabetes mellitus with diabetic peripheral angiopathy without gangrene: Secondary | ICD-10-CM | POA: Diagnosis not present

## 2020-03-23 DIAGNOSIS — I872 Venous insufficiency (chronic) (peripheral): Secondary | ICD-10-CM | POA: Diagnosis not present

## 2020-03-24 DIAGNOSIS — J9610 Chronic respiratory failure, unspecified whether with hypoxia or hypercapnia: Secondary | ICD-10-CM | POA: Diagnosis not present

## 2020-03-26 ENCOUNTER — Telehealth: Payer: Self-pay

## 2020-03-26 DIAGNOSIS — I482 Chronic atrial fibrillation, unspecified: Secondary | ICD-10-CM | POA: Diagnosis not present

## 2020-03-26 DIAGNOSIS — I872 Venous insufficiency (chronic) (peripheral): Secondary | ICD-10-CM | POA: Diagnosis not present

## 2020-03-26 DIAGNOSIS — I824Z2 Acute embolism and thrombosis of unspecified deep veins of left distal lower extremity: Secondary | ICD-10-CM | POA: Diagnosis not present

## 2020-03-26 DIAGNOSIS — E785 Hyperlipidemia, unspecified: Secondary | ICD-10-CM | POA: Diagnosis not present

## 2020-03-26 DIAGNOSIS — L97811 Non-pressure chronic ulcer of other part of right lower leg limited to breakdown of skin: Secondary | ICD-10-CM | POA: Diagnosis not present

## 2020-03-26 DIAGNOSIS — J9610 Chronic respiratory failure, unspecified whether with hypoxia or hypercapnia: Secondary | ICD-10-CM | POA: Diagnosis not present

## 2020-03-26 DIAGNOSIS — E039 Hypothyroidism, unspecified: Secondary | ICD-10-CM | POA: Diagnosis not present

## 2020-03-26 DIAGNOSIS — K219 Gastro-esophageal reflux disease without esophagitis: Secondary | ICD-10-CM | POA: Diagnosis not present

## 2020-03-26 DIAGNOSIS — J449 Chronic obstructive pulmonary disease, unspecified: Secondary | ICD-10-CM | POA: Diagnosis not present

## 2020-03-26 DIAGNOSIS — E1151 Type 2 diabetes mellitus with diabetic peripheral angiopathy without gangrene: Secondary | ICD-10-CM | POA: Diagnosis not present

## 2020-03-26 DIAGNOSIS — Z8673 Personal history of transient ischemic attack (TIA), and cerebral infarction without residual deficits: Secondary | ICD-10-CM | POA: Diagnosis not present

## 2020-03-26 DIAGNOSIS — M797 Fibromyalgia: Secondary | ICD-10-CM | POA: Diagnosis not present

## 2020-03-26 DIAGNOSIS — J9620 Acute and chronic respiratory failure, unspecified whether with hypoxia or hypercapnia: Secondary | ICD-10-CM | POA: Diagnosis not present

## 2020-03-26 DIAGNOSIS — G4733 Obstructive sleep apnea (adult) (pediatric): Secondary | ICD-10-CM | POA: Diagnosis not present

## 2020-03-26 DIAGNOSIS — E1142 Type 2 diabetes mellitus with diabetic polyneuropathy: Secondary | ICD-10-CM | POA: Diagnosis not present

## 2020-03-26 DIAGNOSIS — I130 Hypertensive heart and chronic kidney disease with heart failure and stage 1 through stage 4 chronic kidney disease, or unspecified chronic kidney disease: Secondary | ICD-10-CM | POA: Diagnosis not present

## 2020-03-26 DIAGNOSIS — L97929 Non-pressure chronic ulcer of unspecified part of left lower leg with unspecified severity: Secondary | ICD-10-CM | POA: Diagnosis not present

## 2020-03-26 DIAGNOSIS — I252 Old myocardial infarction: Secondary | ICD-10-CM | POA: Diagnosis not present

## 2020-03-26 DIAGNOSIS — G2581 Restless legs syndrome: Secondary | ICD-10-CM | POA: Diagnosis not present

## 2020-03-26 DIAGNOSIS — I25119 Atherosclerotic heart disease of native coronary artery with unspecified angina pectoris: Secondary | ICD-10-CM | POA: Diagnosis not present

## 2020-03-26 DIAGNOSIS — M170 Bilateral primary osteoarthritis of knee: Secondary | ICD-10-CM | POA: Diagnosis not present

## 2020-03-26 DIAGNOSIS — I5032 Chronic diastolic (congestive) heart failure: Secondary | ICD-10-CM | POA: Diagnosis not present

## 2020-03-26 DIAGNOSIS — Z9981 Dependence on supplemental oxygen: Secondary | ICD-10-CM | POA: Diagnosis not present

## 2020-03-26 DIAGNOSIS — E1122 Type 2 diabetes mellitus with diabetic chronic kidney disease: Secondary | ICD-10-CM | POA: Diagnosis not present

## 2020-03-26 DIAGNOSIS — N183 Chronic kidney disease, stage 3 unspecified: Secondary | ICD-10-CM | POA: Diagnosis not present
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
## 2020-03-27 ENCOUNTER — Ambulatory Visit (INDEPENDENT_AMBULATORY_CARE_PROVIDER_SITE_OTHER): Payer: Medicare Other | Admitting: Family Medicine

## 2020-03-27 ENCOUNTER — Encounter: Payer: Self-pay | Admitting: Family Medicine

## 2020-03-27 ENCOUNTER — Other Ambulatory Visit: Payer: Self-pay

## 2020-03-27 VITALS — BP 140/64 | HR 93 | Temp 97.9°F | Ht 67.0 in | Wt 250.0 lb

## 2020-03-27 DIAGNOSIS — E1151 Type 2 diabetes mellitus with diabetic peripheral angiopathy without gangrene: Secondary | ICD-10-CM | POA: Diagnosis not present

## 2020-03-27 DIAGNOSIS — N1832 Chronic kidney disease, stage 3b: Secondary | ICD-10-CM

## 2020-03-27 DIAGNOSIS — M170 Bilateral primary osteoarthritis of knee: Secondary | ICD-10-CM | POA: Diagnosis not present

## 2020-03-27 DIAGNOSIS — I25119 Atherosclerotic heart disease of native coronary artery with unspecified angina pectoris: Secondary | ICD-10-CM | POA: Diagnosis not present

## 2020-03-27 DIAGNOSIS — G2581 Restless legs syndrome: Secondary | ICD-10-CM | POA: Diagnosis not present

## 2020-03-27 DIAGNOSIS — E1121 Type 2 diabetes mellitus with diabetic nephropathy: Secondary | ICD-10-CM | POA: Diagnosis not present

## 2020-03-27 DIAGNOSIS — I824Z2 Acute embolism and thrombosis of unspecified deep veins of left distal lower extremity: Secondary | ICD-10-CM | POA: Diagnosis not present

## 2020-03-27 DIAGNOSIS — I872 Venous insufficiency (chronic) (peripheral): Secondary | ICD-10-CM | POA: Diagnosis not present

## 2020-03-27 DIAGNOSIS — J9620 Acute and chronic respiratory failure, unspecified whether with hypoxia or hypercapnia: Secondary | ICD-10-CM | POA: Diagnosis not present

## 2020-03-27 DIAGNOSIS — L97929 Non-pressure chronic ulcer of unspecified part of left lower leg with unspecified severity: Secondary | ICD-10-CM | POA: Diagnosis not present

## 2020-03-27 DIAGNOSIS — E038 Other specified hypothyroidism: Secondary | ICD-10-CM | POA: Diagnosis not present

## 2020-03-27 DIAGNOSIS — I5032 Chronic diastolic (congestive) heart failure: Secondary | ICD-10-CM | POA: Diagnosis not present

## 2020-03-27 DIAGNOSIS — N183 Chronic kidney disease, stage 3 unspecified: Secondary | ICD-10-CM | POA: Diagnosis not present

## 2020-03-27 DIAGNOSIS — Z9981 Dependence on supplemental oxygen: Secondary | ICD-10-CM | POA: Diagnosis not present

## 2020-03-27 DIAGNOSIS — I110 Hypertensive heart disease with heart failure: Secondary | ICD-10-CM

## 2020-03-27 DIAGNOSIS — I482 Chronic atrial fibrillation, unspecified: Secondary | ICD-10-CM | POA: Diagnosis not present

## 2020-03-27 DIAGNOSIS — I130 Hypertensive heart and chronic kidney disease with heart failure and stage 1 through stage 4 chronic kidney disease, or unspecified chronic kidney disease: Secondary | ICD-10-CM | POA: Diagnosis not present

## 2020-03-27 DIAGNOSIS — E785 Hyperlipidemia, unspecified: Secondary | ICD-10-CM | POA: Diagnosis not present

## 2020-03-27 DIAGNOSIS — K219 Gastro-esophageal reflux disease without esophagitis: Secondary | ICD-10-CM | POA: Diagnosis not present

## 2020-03-27 DIAGNOSIS — E1122 Type 2 diabetes mellitus with diabetic chronic kidney disease: Secondary | ICD-10-CM | POA: Diagnosis not present

## 2020-03-27 DIAGNOSIS — G4733 Obstructive sleep apnea (adult) (pediatric): Secondary | ICD-10-CM | POA: Diagnosis not present

## 2020-03-27 DIAGNOSIS — Z794 Long term (current) use of insulin: Secondary | ICD-10-CM | POA: Diagnosis not present

## 2020-03-27 DIAGNOSIS — E039 Hypothyroidism, unspecified: Secondary | ICD-10-CM | POA: Diagnosis not present

## 2020-03-27 DIAGNOSIS — J449 Chronic obstructive pulmonary disease, unspecified: Secondary | ICD-10-CM | POA: Diagnosis not present

## 2020-03-27 DIAGNOSIS — E1142 Type 2 diabetes mellitus with diabetic polyneuropathy: Secondary | ICD-10-CM | POA: Diagnosis not present

## 2020-03-27 DIAGNOSIS — M797 Fibromyalgia: Secondary | ICD-10-CM | POA: Diagnosis not present

## 2020-03-27 DIAGNOSIS — L97811 Non-pressure chronic ulcer of other part of right lower leg limited to breakdown of skin: Secondary | ICD-10-CM | POA: Diagnosis not present

## 2020-03-27 DIAGNOSIS — I252 Old myocardial infarction: Secondary | ICD-10-CM | POA: Diagnosis not present

## 2020-03-27 DIAGNOSIS — Z8673 Personal history of transient ischemic attack (TIA), and cerebral infarction without residual deficits: Secondary | ICD-10-CM | POA: Diagnosis not present

## 2020-03-27 MED ORDER — OZEMPIC (0.25 OR 0.5 MG/DOSE) 2 MG/1.5ML ~~LOC~~ SOPN
0.5000 mg | PEN_INJECTOR | SUBCUTANEOUS | 2 refills | Status: DC
Start: 2020-03-27 — End: 2020-04-25

## 2020-03-27 NOTE — Patient Instructions (Addendum)
Increase ozempic 0.5 mg once weekly.  Continue other medications.

## 2020-03-27 NOTE — Progress Notes (Signed)
Subjective:  Patient ID: Jon [name], male    DOB: Jan 16, 1951  Age: 69 y.o. MRN: [id number]  Chief Complaint  Patient presents with  . Hypertension  . Diabetes    HPI Hypertensive heart disease with heart failure [facility]) Patient had experienced hypotension and Dr Holly Bodily decreased Hydralazine 66m (1092mmedication in 1/2 tablet three times a day.) He was instructed to follow up with Dr. MuBettina GaviaScheduled 05/03/2020.   Type 2 diabetes mellitus with stage 3b chronic kidney disease, with long-term current use of insulin (HCC) His sugars have stabilized, but are still up in the evenings.  He is currently on levemir 40 U daily and ozempic 0.5 mg once weekly. His sugars are form 100-350.   Current Outpatient Medications on File Prior to Visit  Medication Sig Dispense Refill  . albuterol (PROVENTIL) (2.5 MG/3ML) 0.083% nebulizer solution Take 3 mLs (2.5 mg total) by nebulization 2 (two) times daily. And as needed (Patient taking differently: Take 2.5 mg by nebulization in the morning, at noon, in the evening, and at bedtime. And as needed) 75 mL 5  . atorvastatin (LIPITOR) 40 MG tablet Take 40 mg by mouth daily.    . Azelastine HCl 0.15 % SOLN Place 2 sprays into both nostrils daily.     . BD PEN NEEDLE NANO U/F 32G X 4 MM MISC     . clopidogrel (PLAVIX) 75 MG tablet Take 1 tablet (75 mg total) by mouth daily. 30 tablet 5  . docusate sodium (COLACE) 100 MG capsule Take 100 mg by mouth 2 (two) times daily.    . famotidine (PEPCID) 20 MG tablet TAKE 2 TABLETS BY MOUTH TWICE DAILY 120 tablet 5  . ferrous sulfate 325 (65 FE) MG tablet Take 325 mg by mouth daily.    . Fluticasone Furoate-Vilanterol (BREO ELLIPTA IN) Inhale 200 mcg into the lungs daily.     . Glucagon, rDNA, (GLUCAGON EMERGENCY) 1 MG KIT     . hydrALAZINE (APRESOLINE) 100 MG tablet Take 1 tablet (100 mg total) by mouth 2 (two) times daily. (Patient taking differently: Take 50 mg by mouth 3 (three) times daily. ) 60 tablet 2  .  insulin detemir (LEVEMIR FLEXTOUCH) 100 UNIT/ML FlexPen Inject 65 Units into the skin daily. (Patient taking differently: Inject 40 Units into the skin daily. ) 30 mL 2  . isosorbide mononitrate (IMDUR) 120 MG 24 hr tablet TAKE 1 TABLET BY MOUTH ONCE DAILY IN THEMORNINGS 90 tablet 0  . metoprolol succinate (TOPROL-XL) 50 MG 24 hr tablet TAKE 1 TABLET BY MOUTH ONCE DAILY 90 tablet 0  . MOVANTIK 25 MG TABS tablet SMARTSIG:1 Tablet(s) By Mouth Daily    . nitroGLYCERIN (NITROSTAT) 0.4 MG SL tablet Place 0.4 mg under the tongue every 5 (five) minutes as needed for chest pain.     . OXYGEN Inhale into the lungs. 2 liters at bedtime    . pantoprazole (PROTONIX) 40 MG tablet Take 40 mg by mouth in the morning.     . Potassium Chloride ER 20 MEQ TBCR Take 40 mEq by mouth 2 (two) times daily.     . Marland Kitchenorsemide (DEMADEX) 20 MG tablet Take 3 tablets (60 mg total) by mouth 2 (two) times daily. 180 tablet 0  . Vitamin D, Ergocalciferol, (DRISDOL) 1.25 MG (50000 UNIT) CAPS capsule TAKE 1 CAPSULE BY MOUTH DAILY 90 capsule 1   No current facility-administered medications on file prior to visit.   Past Medical History:  Diagnosis Date  . Abnormal  EKG 11/29/2014  . Acute renal insufficiency 11/29/2014  . Angina   . Arthritis    "knees" (01/18/2014)  . Asthma   . Atrial fibrillation ([place])   . Back pain 01/25/2015  . 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 BOOP (bronchiolitis obliterans with organizing pneumonia) ([place]) 01/20/2014   OLBx 01/20/14 BOOP Started steroids 02/28/14   . Chronic anticoagulation-Xarelto 02/09/2014  . Chronic atrial fibrillation ([place]) 01/03/2014  . Chronic bronchitis ([place]) 12/21/2013  . Chronic diastolic heart failure ([place]) 02/22/2015  . Chronic pain of both knees 03/18/2017   Overview:  Added automatically from request for surgery [id number]  . Chronic respiratory failure ([place]) 04/10/2015  . COPD (chronic obstructive pulmonary disease) ([place]) 01/25/2015  . Cough    coughing up blood- started last nite,  seems to be worse now  . CVA (cerebral vascular accident) ([place]) 02/09/2014  . Dysrhythmia    atrial fib takes cardizem,   . Fibromyalgia   . GERD (gastroesophageal reflux disease)   . H/O hiatal hernia   . Hemoptysis 08/01/2015  . Herpes zoster 06/26/2015  . History of stroke   . Hyperkalemia-repeat pending 11/29/2014  . Hyperlipemia   . Hyperlipidemia   . Hypertension   . Hypothyroidism   . Long term (current) use of anticoagulants 03/21/2014  . Mild CAD 02/20/2015   Overview:  On recent cardiac cath  Overview:  On recent cardiac cath  . Myocardial infarction [facility])    "one dr says yes; another says no"  . Neuromuscular disorder (HCC)    rls, neuropathy  . Neuropathy    rls, neuropathy   . Normal coronary arteries 2011 11/29/2014  . Obesity (BMI 30-39.9)   . On home oxygen therapy    "2L w/CPAP at bedtime" (09/01/2018)  . OSA on CPAP    cpap & oxygen  . Pericardial effusion   . Peritoneal effusion, chronic 02/22/2015   Overview:  With surgical window  . PNA (pneumonia) 04/10/2015  . Pneumonia 5/15   "several times" (01/18/2014)  . Precordial pain 01/26/2015  . Preoperative cardiovascular examination 02/24/2016  . Primary osteoarthritis of both knees 03/18/2017   Overview:  Added automatically from request for surgery [id number]  . Restless leg syndrome   . Sleep apnea    cpap & oxygen   . Stroke [facility]) 2012   denies residual on 01/18/2014  . Troponin level elevated 11/29/2014  . Uncontrolled type 2 diabetes mellitus with diabetic polyneuropathy, with long-term current use of insulin ([place]) 12/21/2013   Past Surgical History:  Procedure Laterality Date  . ABDOMINAL AORTOGRAM W/LOWER EXTREMITY N/A 09/08/2018   Procedure: ABDOMINAL AORTOGRAM W/LOWER EXTREMITY;  Surgeon: Marty Heck, MD;  Location: [place] [facility];  Service: Cardiovascular;  Laterality: N/A;  . AMPUTATION Right 09/09/2018   Procedure: AMPUTATION RIGHT GREAT TOE;  Surgeon: Waynetta Sandy, MD;  Location:  [place];  Service: Vascular;  Laterality: Right;  . APPENDECTOMY    . CARDIAC CATHETERIZATION  X 2 then 03/03/2012    NL LVF, normal coronaries, vessels are small ([facility]: Dr. Beatrix Fetters)  . CATARACT EXTRACTION W/ INTRAOCULAR LENS  IMPLANT, BILATERAL Bilateral   . CHOLECYSTECTOMY    . HERNIA REPAIR     UHR  . LAPAROSCOPIC GASTRIC BANDING  2010  . LOWER EXTREMITY ANGIOGRAPHY Left 10/11/2018   Procedure: LOWER EXTREMITY ANGIOGRAPHY;  Surgeon: Waynetta Sandy, MD;  Location: [place] [facility];  Service: Cardiovascular;  Laterality: Left;  . PERICARDIAL WINDOW Left 01/24/2014   Procedure: PERICARDIAL WINDOW;  Surgeon: Gaye Pollack, MD;  Location: [place];  Service: Thoracic;  Laterality: Left;  . PERIPHERAL VASCULAR INTERVENTION Right 09/08/2018   Procedure: PERIPHERAL VASCULAR INTERVENTION;  Surgeon: Marty Heck, MD;  Location: [place] [facility];  Service: Cardiovascular;  Laterality: Right;  . SINUS EXPLORATION  X 2  . TEE WITHOUT CARDIOVERSION N/A 09/07/2018   Procedure: TRANSESOPHAGEAL ECHOCARDIOGRAM (TEE);  Surgeon: Acie Fredrickson Wonda Cheng, MD;  Location: [place];  Service: Cardiovascular;  Laterality: N/A;  . UMBILICAL HERNIA REPAIR    . VIDEO ASSISTED THORACOSCOPY Left 01/24/2014   Procedure: VIDEO ASSISTED THORACOSCOPY;  Surgeon: Gaye Pollack, MD;  Location: [facility] OR;  Service: Thoracic;  Laterality: Left;  VATS/open lung biopsy  . VIDEO BRONCHOSCOPY Bilateral 12/29/2013   Procedure: VIDEO BRONCHOSCOPY WITHOUT FLUORO;  Surgeon: Elsie Stain, MD;  Location: [facility];  Service: Endoscopy;  Laterality: Bilateral;  . VIDEO BRONCHOSCOPY N/A 01/24/2014   Procedure: VIDEO BRONCHOSCOPY;  Surgeon: Gaye Pollack, MD;  Location: [facility] OR;  Service: Thoracic;  Laterality: N/A;    Family History  Problem Relation Age of Onset  . Cancer Mother   . Stroke Father   . Heart disease Father   . Seizures Sister   . Diabetes Sister   . Anesthesia problems Neg Hx   . Hypotension Neg Hx   .  Malignant hyperthermia Neg Hx   . Pseudochol deficiency Neg Hx    Social History   Socioeconomic History  . Marital status: Widowed    Spouse name: Not on file  . Number of children: Not on file  . Years of education: Not on file  . Highest education level: Not on file  Occupational History  . Not on file  Tobacco Use  . Smoking status: Former Smoker    Types: Cigars    Quit date: 09/01/2006    Years since quitting: 13.6  . Smokeless tobacco: Never Used  Vaping Use  . Vaping Use: Never used  Substance and Sexual Activity  . 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 Drug use: No  . Sexual activity: Never  Other Topics Concern  . Not on file  Social History Narrative  . Not on file   Social Determinants of Health   Financial Resource Strain:   . Difficulty of Paying Living Expenses:   Food Insecurity:   . Worried About Charity fundraiser in the Last Year:   . Arboriculturist in the Last Year:   Transportation Needs: No Transportation Needs  . Lack of Transportation (Medical): No  . Lack of Transportation (Non-Medical): No  Physical Activity: Inactive  . Days of Exercise per Week: 0 days  . Minutes of Exercise per Session: 0 min  Stress:   . Feeling of Stress :   Social Connections:   . Frequency of Communication with Friends and Family:   . Frequency of Social Gatherings with Friends and Family:   . Attends Religious Services:   . Active Member of Clubs or Organizations:   . Attends Archivist Meetings:   Marland Kitchen Marital Status:     Review of Systems  Constitutional: Negative for chills, diaphoresis, fatigue and fever.  HENT: Negative for congestion, ear pain and sore throat.   Respiratory: Negative for cough and shortness of breath.   Cardiovascular: Negative for chest pain and leg swelling.  Gastrointestinal: Negative for abdominal pain, constipation, diarrhea, nausea and vomiting.  Genitourinary: Negative  for dysuria  and urgency.  Musculoskeletal: Negative for arthralgias and myalgias.  Neurological: Negative for dizziness and headaches.  Psychiatric/Behavioral: Negative for dysphoric mood.     Objective:  BP (!) 140/64   Pulse 93   Temp 97.9 F (36.6 C)   Ht '5\' 7"'  (1.702 m)   Wt (!) 250 lb (113.4 kg)   SpO2 96%   BMI 39.16 kg/m   BP/Weight 03/27/2020 03/22/2020 11/22/4008  Systolic BP 272 536 644  Diastolic BP 64 68 60  Wt. (Lbs) 250 255 256.8  BMI 39.16 39.94 40.22    Physical Exam Vitals reviewed.  Constitutional:      Appearance: Normal appearance. He is obese.  Cardiovascular:     Rate and Rhythm: Normal rate and regular rhythm.  Pulmonary:     Effort: Pulmonary effort is normal.     Breath sounds: Normal breath sounds.  Abdominal:     General: Abdomen is flat. Bowel sounds are normal.     Palpations: Abdomen is soft.  Neurological:     Mental Status: He is alert and oriented to person, place, and time.  Psychiatric:        Mood and Affect: Mood normal.        Behavior: Behavior normal.     Diabetic Foot Exam - Simple   No data filed       Lab Results  Component Value Date   WBC 12.2 (H) 03/27/2020   HGB 13.7 03/27/2020   HCT 41.3 03/27/2020   PLT 357 03/27/2020   GLUCOSE 239 (H) 03/27/2020   CHOL 117 12/13/2019   TRIG 124 12/13/2019   HDL 40 12/13/2019   LDLCALC 55 12/13/2019   ALT 9 03/27/2020   AST 11 03/27/2020   NA 140 03/27/2020   K 3.5 03/27/2020   CL 97 03/27/2020   CREATININE 2.07 (H) 03/27/2020   BUN 35 (H) 03/27/2020   CO2 25 03/27/2020   TSH 1.560 03/27/2020   INR 1.68 09/01/2018   HGBA1C 8.8 (H) 03/27/2020      Assessment & Plan:   1. Type 2 diabetes mellitus with stage 3b chronic kidney disease, with long-term current use of insulin (HCC) Control: uncontrolled. Recommend check sugar qac and qhs. Recommend check feet daily. Recommend annual eye exams. Medicines: Increase ozempic to 0.5 mg once weekly. Continue levemir 40 mg once  daily. Continue to work on eating a healthy diet and exercise.  Labs drawn today.   - Hemoglobin A1c  2. Other specified hypothyroidism - TSH  3. Hypertensive heart disease with heart failure (HCC) Improved. Keep appt with cardiology.  No changes to medicines.  Continue to work on eating a healthy diet and exercise.  Labs drawn today.  - Comprehensive metabolic panel - CBC with Differential/Platelet    Meds ordered this encounter  Medications  . Semaglutide,0.25 or 0.5MG/DOS, (OZEMPIC, 0.25 OR 0.5 MG/DOSE,) 2 MG/1.5ML SOPN    Sig: Inject 0.375 mLs (0.5 mg total) into the skin once a week.    Dispense:  4 pen    Refill:  2    Orders Placed This Encounter  Procedures  . Hemoglobin A1c  . Comprehensive metabolic panel  . CBC with Differential/Platelet  . TSH     Follow-up: Return in about 4 weeks (around 04/24/2020).  An After Visit Summary was printed and given to the patient.  Rochel Brome [facility] [phone]

## 2020-03-28 LAB — COMPREHENSIVE METABOLIC PANEL
ALT: 9 IU/L (ref 0–44)
AST: 11 IU/L (ref 0–40)
Albumin/Globulin Ratio: 1.1 — ABNORMAL LOW (ref 1.2–2.2)
Albumin: 3.6 g/dL — ABNORMAL LOW (ref 3.7–4.7)
Alkaline Phosphatase: 142 IU/L — ABNORMAL HIGH (ref 48–121)
BUN/Creatinine Ratio: 17 (ref 10–24)
BUN: 35 mg/dL — ABNORMAL HIGH (ref 8–27)
Bilirubin Total: 0.7 mg/dL (ref 0.0–1.2)
CO2: 25 mmol/L (ref 20–29)
Calcium: 8.6 mg/dL (ref 8.6–10.2)
Chloride: 97 mmol/L (ref 96–106)
Creatinine, Ser: 2.07 mg/dL — ABNORMAL HIGH (ref 0.76–1.27)
GFR calc Af Amer: 36 mL/min/{1.73_m2} — ABNORMAL LOW (ref >59)
GFR calc non Af Amer: 31 mL/min/{1.73_m2} — ABNORMAL LOW (ref >59)
Globulin, Total: 3.2 g/dL (ref 1.5–4.5)
Glucose: 239 mg/dL — ABNORMAL HIGH (ref 65–99)
Potassium: 3.5 mmol/L (ref 3.5–5.2)
Sodium: 140 mmol/L (ref 134–144)
Total Protein: 6.8 g/dL (ref 6.0–8.5)

## 2020-03-28 LAB — HEMOGLOBIN A1C
Est. average glucose Bld gHb Est-mCnc: 206 mg/dL
Hgb A1c MFr Bld: 8.8 % — ABNORMAL HIGH (ref 4.8–5.6)

## 2020-03-28 LAB — CBC WITH DIFFERENTIAL/PLATELET
Basophils Absolute: 0.1 10*3/uL (ref 0.0–0.2)
Basos: 0 %
EOS (ABSOLUTE): 0.1 10*3/uL (ref 0.0–0.4)
Eos: 1 %
Hematocrit: 41.3 % (ref 37.5–51.0)
Hemoglobin: 13.7 g/dL (ref 13.0–17.7)
Immature Grans (Abs): 0 10*3/uL (ref 0.0–0.1)
Immature Granulocytes: 0 %
Lymphocytes Absolute: 2.1 10*3/uL (ref 0.7–3.1)
Lymphs: 17 %
MCH: 28.4 pg (ref 26.6–33.0)
MCHC: 33.2 g/dL (ref 31.5–35.7)
MCV: 86 fL (ref 79–97)
Monocytes Absolute: 0.8 10*3/uL (ref 0.1–0.9)
Monocytes: 7 %
Neutrophils Absolute: 9.1 10*3/uL — ABNORMAL HIGH (ref 1.4–7.0)
Neutrophils: 75 %
Platelets: 357 10*3/uL (ref 150–450)
RBC: 4.82 x10E6/uL (ref 4.14–5.80)
RDW: 13.2 % (ref 11.6–15.4)
WBC: 12.2 10*3/uL — ABNORMAL HIGH (ref 3.4–10.8)

## 2020-03-28 LAB — TSH: TSH: 1.56 u[IU]/mL (ref 0.450–4.500)

## 2020-03-29 ENCOUNTER — Other Ambulatory Visit: Payer: Self-pay | Admitting: Physician Assistant

## 2020-03-29 ENCOUNTER — Other Ambulatory Visit: Payer: Self-pay | Admitting: Family Medicine

## 2020-03-29 DIAGNOSIS — I5032 Chronic diastolic (congestive) heart failure: Secondary | ICD-10-CM

## 2020-03-30 ENCOUNTER — Telehealth: Payer: Self-pay

## 2020-03-30 DIAGNOSIS — E785 Hyperlipidemia, unspecified: Secondary | ICD-10-CM | POA: Diagnosis not present

## 2020-03-30 DIAGNOSIS — G2581 Restless legs syndrome: Secondary | ICD-10-CM | POA: Diagnosis not present

## 2020-03-30 DIAGNOSIS — J449 Chronic obstructive pulmonary disease, unspecified: Secondary | ICD-10-CM | POA: Diagnosis not present

## 2020-03-30 DIAGNOSIS — E1122 Type 2 diabetes mellitus with diabetic chronic kidney disease: Secondary | ICD-10-CM | POA: Diagnosis not present

## 2020-03-30 DIAGNOSIS — M797 Fibromyalgia: Secondary | ICD-10-CM | POA: Diagnosis not present

## 2020-03-30 DIAGNOSIS — J9620 Acute and chronic respiratory failure, unspecified whether with hypoxia or hypercapnia: Secondary | ICD-10-CM | POA: Diagnosis not present

## 2020-03-30 DIAGNOSIS — I5032 Chronic diastolic (congestive) heart failure: Secondary | ICD-10-CM | POA: Diagnosis not present

## 2020-03-30 DIAGNOSIS — E039 Hypothyroidism, unspecified: Secondary | ICD-10-CM | POA: Diagnosis not present

## 2020-03-30 DIAGNOSIS — I872 Venous insufficiency (chronic) (peripheral): Secondary | ICD-10-CM | POA: Diagnosis not present

## 2020-03-30 DIAGNOSIS — I482 Chronic atrial fibrillation, unspecified: Secondary | ICD-10-CM | POA: Diagnosis not present

## 2020-03-30 DIAGNOSIS — M170 Bilateral primary osteoarthritis of knee: Secondary | ICD-10-CM | POA: Diagnosis not present

## 2020-03-30 DIAGNOSIS — G4733 Obstructive sleep apnea (adult) (pediatric): Secondary | ICD-10-CM | POA: Diagnosis not present

## 2020-03-30 DIAGNOSIS — Z9981 Dependence on supplemental oxygen: Secondary | ICD-10-CM | POA: Diagnosis not present

## 2020-03-30 DIAGNOSIS — N183 Chronic kidney disease, stage 3 unspecified: Secondary | ICD-10-CM | POA: Diagnosis not present

## 2020-03-30 DIAGNOSIS — E1151 Type 2 diabetes mellitus with diabetic peripheral angiopathy without gangrene: Secondary | ICD-10-CM | POA: Diagnosis not present

## 2020-03-30 DIAGNOSIS — Z8673 Personal history of transient ischemic attack (TIA), and cerebral infarction without residual deficits: Secondary | ICD-10-CM | POA: Diagnosis not present

## 2020-03-30 DIAGNOSIS — I824Z2 Acute embolism and thrombosis of unspecified deep veins of left distal lower extremity: Secondary | ICD-10-CM | POA: Diagnosis not present

## 2020-03-30 DIAGNOSIS — I25119 Atherosclerotic heart disease of native coronary artery with unspecified angina pectoris: Secondary | ICD-10-CM | POA: Diagnosis not present

## 2020-03-30 DIAGNOSIS — K219 Gastro-esophageal reflux disease without esophagitis: Secondary | ICD-10-CM | POA: Diagnosis not present

## 2020-03-30 DIAGNOSIS — E1142 Type 2 diabetes mellitus with diabetic polyneuropathy: Secondary | ICD-10-CM | POA: Diagnosis not present

## 2020-03-30 DIAGNOSIS — L97811 Non-pressure chronic ulcer of other part of right lower leg limited to breakdown of skin: Secondary | ICD-10-CM | POA: Diagnosis not present

## 2020-03-30 DIAGNOSIS — I252 Old myocardial infarction: Secondary | ICD-10-CM | POA: Diagnosis not present

## 2020-03-30 DIAGNOSIS — L97929 Non-pressure chronic ulcer of unspecified part of left lower leg with unspecified severity: Secondary | ICD-10-CM | POA: Diagnosis not present

## 2020-03-30 DIAGNOSIS — I130 Hypertensive heart and chronic kidney disease with heart failure and stage 1 through stage 4 chronic kidney disease, or unspecified chronic kidney disease: Secondary | ICD-10-CM | POA: Diagnosis not present

## 2020-03-30 NOTE — Telephone Encounter (Signed)
Per covermymeds patients insurance prefers Amitiza vs Relistor

## 2020-04-01 DIAGNOSIS — E1151 Type 2 diabetes mellitus with diabetic peripheral angiopathy without gangrene: Secondary | ICD-10-CM | POA: Diagnosis not present

## 2020-04-01 DIAGNOSIS — L97811 Non-pressure chronic ulcer of other part of right lower leg limited to breakdown of skin: Secondary | ICD-10-CM | POA: Diagnosis not present

## 2020-04-01 DIAGNOSIS — I872 Venous insufficiency (chronic) (peripheral): Secondary | ICD-10-CM | POA: Diagnosis not present

## 2020-04-02 ENCOUNTER — Telehealth: Payer: Self-pay

## 2020-04-02 DIAGNOSIS — G4733 Obstructive sleep apnea (adult) (pediatric): Secondary | ICD-10-CM | POA: Diagnosis not present

## 2020-04-02 DIAGNOSIS — K219 Gastro-esophageal reflux disease without esophagitis: Secondary | ICD-10-CM | POA: Diagnosis not present

## 2020-04-02 DIAGNOSIS — Z8673 Personal history of transient ischemic attack (TIA), and cerebral infarction without residual deficits: Secondary | ICD-10-CM | POA: Diagnosis not present

## 2020-04-02 DIAGNOSIS — M797 Fibromyalgia: Secondary | ICD-10-CM | POA: Diagnosis not present

## 2020-04-02 DIAGNOSIS — I130 Hypertensive heart and chronic kidney disease with heart failure and stage 1 through stage 4 chronic kidney disease, or unspecified chronic kidney disease: Secondary | ICD-10-CM | POA: Diagnosis not present

## 2020-04-02 DIAGNOSIS — I5032 Chronic diastolic (congestive) heart failure: Secondary | ICD-10-CM | POA: Diagnosis not present

## 2020-04-02 DIAGNOSIS — I25119 Atherosclerotic heart disease of native coronary artery with unspecified angina pectoris: Secondary | ICD-10-CM | POA: Diagnosis not present

## 2020-04-02 DIAGNOSIS — I482 Chronic atrial fibrillation, unspecified: Secondary | ICD-10-CM | POA: Diagnosis not present

## 2020-04-02 DIAGNOSIS — M170 Bilateral primary osteoarthritis of knee: Secondary | ICD-10-CM | POA: Diagnosis not present

## 2020-04-02 DIAGNOSIS — I872 Venous insufficiency (chronic) (peripheral): Secondary | ICD-10-CM | POA: Diagnosis not present

## 2020-04-02 DIAGNOSIS — J9620 Acute and chronic respiratory failure, unspecified whether with hypoxia or hypercapnia: Secondary | ICD-10-CM | POA: Diagnosis not present

## 2020-04-02 DIAGNOSIS — E1151 Type 2 diabetes mellitus with diabetic peripheral angiopathy without gangrene: Secondary | ICD-10-CM | POA: Diagnosis not present

## 2020-04-02 DIAGNOSIS — I252 Old myocardial infarction: Secondary | ICD-10-CM | POA: Diagnosis not present

## 2020-04-02 DIAGNOSIS — L97929 Non-pressure chronic ulcer of unspecified part of left lower leg with unspecified severity: Secondary | ICD-10-CM | POA: Diagnosis not present

## 2020-04-02 DIAGNOSIS — J449 Chronic obstructive pulmonary disease, unspecified: Secondary | ICD-10-CM | POA: Diagnosis not present

## 2020-04-02 DIAGNOSIS — I824Z2 Acute embolism and thrombosis of unspecified deep veins of left distal lower extremity: Secondary | ICD-10-CM | POA: Diagnosis not present

## 2020-04-02 DIAGNOSIS — N183 Chronic kidney disease, stage 3 unspecified: Secondary | ICD-10-CM | POA: Diagnosis not present

## 2020-04-02 DIAGNOSIS — E1122 Type 2 diabetes mellitus with diabetic chronic kidney disease: Secondary | ICD-10-CM | POA: Diagnosis not present

## 2020-04-02 DIAGNOSIS — G2581 Restless legs syndrome: Secondary | ICD-10-CM | POA: Diagnosis not present

## 2020-04-02 DIAGNOSIS — Z9981 Dependence on supplemental oxygen: Secondary | ICD-10-CM | POA: Diagnosis not present

## 2020-04-02 DIAGNOSIS — L97811 Non-pressure chronic ulcer of other part of right lower leg limited to breakdown of skin: Secondary | ICD-10-CM | POA: Diagnosis not present

## 2020-04-02 DIAGNOSIS — E1142 Type 2 diabetes mellitus with diabetic polyneuropathy: Secondary | ICD-10-CM | POA: Diagnosis not present

## 2020-04-02 DIAGNOSIS — E785 Hyperlipidemia, unspecified: Secondary | ICD-10-CM | POA: Diagnosis not present

## 2020-04-02 DIAGNOSIS — E039 Hypothyroidism, unspecified: Secondary | ICD-10-CM | POA: Diagnosis not present

## 2020-04-02 NOTE — Telephone Encounter (Signed)
PA for Relistor submitted and approved via covermymeds.

## 2020-04-03 ENCOUNTER — Telehealth: Payer: Self-pay

## 2020-04-03 ENCOUNTER — Telehealth: Payer: Medicare Other

## 2020-04-03 NOTE — Telephone Encounter (Signed)
Patient missed appointment with home health for wound care today, discussed with Dr. Tobie Poet.

## 2020-04-04 ENCOUNTER — Ambulatory Visit: Payer: Medicare Other

## 2020-04-04 DIAGNOSIS — I110 Hypertensive heart disease with heart failure: Secondary | ICD-10-CM

## 2020-04-04 DIAGNOSIS — N1832 Chronic kidney disease, stage 3b: Secondary | ICD-10-CM

## 2020-04-04 DIAGNOSIS — Z794 Long term (current) use of insulin: Secondary | ICD-10-CM
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
## 2020-04-06 DIAGNOSIS — I824Z2 Acute embolism and thrombosis of unspecified deep veins of left distal lower extremity: Secondary | ICD-10-CM | POA: Diagnosis not present

## 2020-04-06 DIAGNOSIS — E785 Hyperlipidemia, unspecified: Secondary | ICD-10-CM | POA: Diagnosis not present

## 2020-04-06 DIAGNOSIS — G2581 Restless legs syndrome: Secondary | ICD-10-CM | POA: Diagnosis not present

## 2020-04-06 DIAGNOSIS — I252 Old myocardial infarction: Secondary | ICD-10-CM | POA: Diagnosis not present

## 2020-04-06 DIAGNOSIS — I872 Venous insufficiency (chronic) (peripheral): Secondary | ICD-10-CM | POA: Diagnosis not present

## 2020-04-06 DIAGNOSIS — K219 Gastro-esophageal reflux disease without esophagitis: Secondary | ICD-10-CM | POA: Diagnosis not present

## 2020-04-06 DIAGNOSIS — I25119 Atherosclerotic heart disease of native coronary artery with unspecified angina pectoris: Secondary | ICD-10-CM | POA: Diagnosis not present

## 2020-04-06 DIAGNOSIS — I5032 Chronic diastolic (congestive) heart failure: Secondary | ICD-10-CM | POA: Diagnosis not present

## 2020-04-06 DIAGNOSIS — G4733 Obstructive sleep apnea (adult) (pediatric): Secondary | ICD-10-CM | POA: Diagnosis not present

## 2020-04-06 DIAGNOSIS — L97929 Non-pressure chronic ulcer of unspecified part of left lower leg with unspecified severity: Secondary | ICD-10-CM | POA: Diagnosis not present

## 2020-04-06 DIAGNOSIS — M170 Bilateral primary osteoarthritis of knee: Secondary | ICD-10-CM | POA: Diagnosis not present

## 2020-04-06 DIAGNOSIS — E039 Hypothyroidism, unspecified: Secondary | ICD-10-CM | POA: Diagnosis not present

## 2020-04-06 DIAGNOSIS — N183 Chronic kidney disease, stage 3 unspecified: Secondary | ICD-10-CM | POA: Diagnosis not present

## 2020-04-06 DIAGNOSIS — E1142 Type 2 diabetes mellitus with diabetic polyneuropathy: Secondary | ICD-10-CM | POA: Diagnosis not present

## 2020-04-06 DIAGNOSIS — Z8673 Personal history of transient ischemic attack (TIA), and cerebral infarction without residual deficits: Secondary | ICD-10-CM | POA: Diagnosis not present

## 2020-04-06 DIAGNOSIS — I130 Hypertensive heart and chronic kidney disease with heart failure and stage 1 through stage 4 chronic kidney disease, or unspecified chronic kidney disease: Secondary | ICD-10-CM | POA: Diagnosis not present

## 2020-04-06 DIAGNOSIS — J449 Chronic obstructive pulmonary disease, unspecified: Secondary | ICD-10-CM | POA: Diagnosis not present

## 2020-04-06 DIAGNOSIS — J9620 Acute and chronic respiratory failure, unspecified whether with hypoxia or hypercapnia: Secondary | ICD-10-CM | POA: Diagnosis not present

## 2020-04-06 DIAGNOSIS — M797 Fibromyalgia: Secondary | ICD-10-CM | POA: Diagnosis not present

## 2020-04-06 DIAGNOSIS — L97811 Non-pressure chronic ulcer of other part of right lower leg limited to breakdown of skin: Secondary | ICD-10-CM | POA: Diagnosis not present

## 2020-04-06 DIAGNOSIS — E1151 Type 2 diabetes mellitus with diabetic peripheral angiopathy without gangrene: Secondary | ICD-10-CM | POA: Diagnosis not present

## 2020-04-06 DIAGNOSIS — E1122 Type 2 diabetes mellitus with diabetic chronic kidney disease: Secondary | ICD-10-CM | POA: Diagnosis not present

## 2020-04-06 DIAGNOSIS — Z9981 Dependence on supplemental oxygen: Secondary | ICD-10-CM | POA: Diagnosis not present

## 2020-04-06 DIAGNOSIS — I482 Chronic atrial fibrillation, unspecified: Secondary | ICD-10-CM | POA: Diagnosis not present

## 2020-04-06 NOTE — Patient Instructions (Signed)
Visit Information  Goals Addressed            This Visit's Progress    Pharmacy Care Plan       CARE PLAN ENTRY  Current Barriers:   Chronic Disease Management support, education, and care coordination needs related to Hypertension and Diabetes   Hypertension  Pharmacist Clinical Goal(s): o Over the next 90 days, patient will work with PharmD and providers to achieve BP goal <130/80  Current regimen:  o hydralazine 100 mg tid o isosorbide mn 120 mg daily o metolazone 2.5 mg daily o torsemide 60 mg bid  Interventions: o Pharmacist helped patient reconcile blood pressure medications after recent trip to emergency room.   Patient self care activities - Over the next 90 days, patient will: o Check BP daily, document, and provide at future appointments o Ensure daily salt intake < 2300 mg/day  Hyperlipidemia  Pharmacist Clinical Goal(s): o Over the next 90 days, patient will work with PharmD and providers to maintain LDL goal < 70  Current regimen:  o Atorvastatin 40 mg daily  Interventions: o Recommend patient continue taking medication as prescribed.   Patient self care activities - Over the next 90 days, patient will: o Take medication as prescribed   Diabetes  Pharmacist Clinical Goal(s): o Over the next 90 days, patient will work with PharmD and providers to achieve A1c goal <7%  Current regimen:  o glucagon kit o levemir flextouch 40 units daily o Ozempic 0.5 mg weekly  Interventions: o Recommend patient decrease Levemir to 45 units qhs to avoid low fasting blood sugars.  o Recommend patient continue taking Ozempic 0.25 mg weekly until sees Dr. Holly Bodily and consider increasing to 0.5 mg weekly thereafter.   Patient self care activities - Over the next 90 days, patient will: o Check blood sugar twice daily, document, and provide at future appointments o Contact provider with any episodes of hypoglycemia  Medication management  Pharmacist Clinical  Goal(s): o Over the next 90 days, patient will work with PharmD and providers to achieve optimal medication adherence  Current pharmacy: [facility]  Interventions o Comprehensive medication review performed. o Continue current medication management strategy  Patient self care activities - Over the next 90 days, patient will: o Focus on medication adherence by using pill box o Take medications as prescribed o Report any questions or concerns to PharmD and/or provider(s)  Please see past updates related to this goal by clicking on the "Past Updates" button in the selected goal         The patient verbalized understanding of instructions provided today and declined a print copy of patient instruction materials.   Telephone follow up appointment with pharmacy team member scheduled for: 05/2020  Sherre Poot, PharmD, [facility] Clinical Pharmacist [facility] Suite [phone] (office) [phone] (mobile)   Blood Glucose Monitoring, Adult Monitoring your blood sugar (glucose) is an important part of managing your diabetes (diabetes mellitus). Blood glucose monitoring involves checking your blood glucose as often as directed and keeping a record (log) of your results over time. Checking your blood glucose regularly and keeping a blood glucose log can:  Help you and your health care provider adjust your diabetes management plan as needed, including your medicines or insulin.  Help you understand how food, exercise, illnesses, and medicines affect your blood glucose.  Let you know what your blood glucose is at any time. You can quickly find out if you have low blood glucose (hypoglycemia) or high blood  glucose (hyperglycemia). Your health care provider will set individualized treatment goals for you. Your goals will be based on your age, other medical conditions you have, and how you respond to diabetes treatment. Generally, the goal of treatment is to maintain the following  blood glucose levels:  Before meals (preprandial): 80-130 mg/dL (4.4-7.2 mmol/L).  After meals (postprandial): below 180 mg/dL (10 mmol/L).  A1c level: less than 7%. Supplies needed:  Blood glucose meter.  Test strips for your meter. Each meter has its own strips. You must use the strips that came with your meter.  A needle to prick your finger (lancet). Do not use a lancet more than one time.  A device that holds the lancet (lancing device).  A journal or log book to write down your results. How to check your blood glucose  1. Wash your hands with soap and water. 2. Prick the side of your finger (not the tip) with the lancet. Use a different finger each time. 3. Gently rub the finger until a small drop of blood appears. 4. Follow instructions that come with your meter for inserting the test strip, applying blood to the strip, and using your blood glucose meter. 5. Write down your result and any notes. Some meters allow you to use areas of your body other than your finger (alternative sites) to test your blood. The most common alternative sites are:  Forearm.  Thigh.  Palm of the hand. If you think you may have hypoglycemia, or if you have a history of not knowing when your blood glucose is getting low (hypoglycemia unawareness), do not use alternative sites. Use your finger instead. Alternative sites may not be as accurate as the fingers, because blood flow is slower in these areas. This means that the result you get may be delayed, and it may be different from the result that you would get from your finger. Follow these instructions at home: Blood glucose log   Every time you check your blood glucose, write down your result. Also write down any notes about things that may be affecting your blood glucose, such as your diet and exercise for the day. This information can help you and your health care provider: ? Look for patterns in your blood glucose over time. ? Adjust your  diabetes management plan as needed.  Check if your meter allows you to download your records to a computer. Most glucose meters store a record of glucose readings in the meter. If you have type 1 diabetes:  Check your blood glucose 2 or more times a day.  Also check your blood glucose: ? Before every insulin injection. ? Before and after exercise. ? Before meals. ? 2 hours after a meal. ? Occasionally between 2:00 a.m. and 3:00 a.m., as directed. ? Before potentially dangerous tasks, like driving or using heavy machinery. ? At bedtime.  You may need to check your blood glucose more often, up to 6-10 times a day, if you: ? Use an insulin pump. ? Need multiple daily injections (MDI). ? Have diabetes that is not well-controlled. ? Are ill. ? Have a history of severe hypoglycemia. ? Have hypoglycemia unawareness. If you have type 2 diabetes:  If you take insulin or other diabetes medicines, check your blood glucose 2 or more times a day.  If you are on intensive insulin therapy, check your blood glucose 4 or more times a day. Occasionally, you may also need to check between 2:00 a.m. and 3:00 a.m., as directed.  Also check your blood glucose: ? Before and after exercise. ? Before potentially dangerous tasks, like driving or using heavy machinery.  You may need to check your blood glucose more often if: ? Your medicine is being adjusted. ? Your diabetes is not well-controlled. ? You are ill. General tips  Always keep your supplies with you.  If you have questions or need help, all blood glucose meters have a 24-hour "hotline" phone number that you can call. You may also contact your health care provider.  After you use a few boxes of test strips, adjust (calibrate) your blood glucose meter by following instructions that came with your meter. Contact a health care provider if:  Your blood glucose is at or above 240 mg/dL (13.3 mmol/L) for 2 days in a row.  You have been sick  or have had a fever for 2 days or longer, and you are not getting better.  You have any of the following problems for more than 6 hours: ? You cannot eat or drink. ? You have nausea or vomiting. ? You have diarrhea. Get help right away if:  Your blood glucose is lower than 54 mg/dL (3 mmol/L).  You become confused or you have trouble thinking clearly.  You have difficulty breathing.  You have moderate or large ketone levels in your urine. Summary  Monitoring your blood sugar (glucose) is an important part of managing your diabetes (diabetes mellitus).  Blood glucose monitoring involves checking your blood glucose as often as directed and keeping a record (log) of your results over time.  Your health care provider will set individualized treatment goals for you. Your goals will be based on your age, other medical conditions you have, and how you respond to diabetes treatment.  Every time you check your blood glucose, write down your result. Also write down any notes about things that may be affecting your blood glucose, such as your diet and exercise for the day. This information is not intended to replace advice given to you by your health care provider. Make sure you discuss any questions you have with your health care provider. Document Revised: 06/11/2018 Document Reviewed: 01/28/2016 Elsevier Patient Education  2020 [employer].

## 2020-04-09 ENCOUNTER — Other Ambulatory Visit: Payer: Self-pay | Admitting: Physician Assistant

## 2020-04-09 DIAGNOSIS — I252 Old myocardial infarction: Secondary | ICD-10-CM | POA: Diagnosis not present

## 2020-04-09 DIAGNOSIS — Z9981 Dependence on supplemental oxygen: Secondary | ICD-10-CM | POA: Diagnosis not present

## 2020-04-09 DIAGNOSIS — J449 Chronic obstructive pulmonary disease, unspecified: Secondary | ICD-10-CM | POA: Diagnosis not present

## 2020-04-09 DIAGNOSIS — E1151 Type 2 diabetes mellitus with diabetic peripheral angiopathy without gangrene: Secondary | ICD-10-CM | POA: Diagnosis not present

## 2020-04-09 DIAGNOSIS — I482 Chronic atrial fibrillation, unspecified: Secondary | ICD-10-CM | POA: Diagnosis not present

## 2020-04-09 DIAGNOSIS — E785 Hyperlipidemia, unspecified: Secondary | ICD-10-CM | POA: Diagnosis not present

## 2020-04-09 DIAGNOSIS — L97929 Non-pressure chronic ulcer of unspecified part of left lower leg with unspecified severity: Secondary | ICD-10-CM | POA: Diagnosis not present

## 2020-04-09 DIAGNOSIS — I824Z2 Acute embolism and thrombosis of unspecified deep veins of left distal lower extremity: Secondary | ICD-10-CM | POA: Diagnosis not present

## 2020-04-09 DIAGNOSIS — G2581 Restless legs syndrome: Secondary | ICD-10-CM | POA: Diagnosis not present

## 2020-04-09 DIAGNOSIS — E1142 Type 2 diabetes mellitus with diabetic polyneuropathy: Secondary | ICD-10-CM | POA: Diagnosis not present

## 2020-04-09 DIAGNOSIS — I25119 Atherosclerotic heart disease of native coronary artery with unspecified angina pectoris: Secondary | ICD-10-CM | POA: Diagnosis not present

## 2020-04-09 DIAGNOSIS — N183 Chronic kidney disease, stage 3 unspecified: Secondary | ICD-10-CM | POA: Diagnosis not present

## 2020-04-09 DIAGNOSIS — I5032 Chronic diastolic (congestive) heart failure: Secondary | ICD-10-CM | POA: Diagnosis not present

## 2020-04-09 DIAGNOSIS — M170 Bilateral primary osteoarthritis of knee: Secondary | ICD-10-CM | POA: Diagnosis not present

## 2020-04-09 DIAGNOSIS — Z8673 Personal history of transient ischemic attack (TIA), and cerebral infarction without residual deficits: Secondary | ICD-10-CM | POA: Diagnosis not present

## 2020-04-09 DIAGNOSIS — J9620 Acute and chronic respiratory failure, unspecified whether with hypoxia or hypercapnia: Secondary | ICD-10-CM | POA: Diagnosis not present

## 2020-04-09 DIAGNOSIS — E1122 Type 2 diabetes mellitus with diabetic chronic kidney disease: Secondary | ICD-10-CM | POA: Diagnosis not present

## 2020-04-09 DIAGNOSIS — E039 Hypothyroidism, unspecified: Secondary | ICD-10-CM | POA: Diagnosis not present

## 2020-04-09 DIAGNOSIS — I872 Venous insufficiency (chronic) (peripheral): Secondary | ICD-10-CM | POA: Diagnosis not present

## 2020-04-09 DIAGNOSIS — L97811 Non-pressure chronic ulcer of other part of right lower leg limited to breakdown of skin: Secondary | ICD-10-CM | POA: Diagnosis not present

## 2020-04-09 DIAGNOSIS — G4733 Obstructive sleep apnea (adult) (pediatric): Secondary | ICD-10-CM | POA: Diagnosis not present

## 2020-04-09 DIAGNOSIS — I130 Hypertensive heart and chronic kidney disease with heart failure and stage 1 through stage 4 chronic kidney disease, or unspecified chronic kidney disease: Secondary | ICD-10-CM | POA: Diagnosis not present

## 2020-04-09 DIAGNOSIS — M797 Fibromyalgia: Secondary | ICD-10-CM | POA: Diagnosis not present

## 2020-04-09 DIAGNOSIS — K219 Gastro-esophageal reflux disease without esophagitis: Secondary | ICD-10-CM | POA: Diagnosis not present

## 2020-04-10 DIAGNOSIS — Z8673 Personal history of transient ischemic attack (TIA), and cerebral infarction without residual deficits: Secondary | ICD-10-CM | POA: Diagnosis not present

## 2020-04-10 DIAGNOSIS — E1151 Type 2 diabetes mellitus with diabetic peripheral angiopathy without gangrene: Secondary | ICD-10-CM | POA: Diagnosis not present

## 2020-04-10 DIAGNOSIS — L97929 Non-pressure chronic ulcer of unspecified part of left lower leg with unspecified severity: Secondary | ICD-10-CM | POA: Diagnosis not present

## 2020-04-10 DIAGNOSIS — Z9981 Dependence on supplemental oxygen: Secondary | ICD-10-CM | POA: Diagnosis not present

## 2020-04-10 DIAGNOSIS — K219 Gastro-esophageal reflux disease without esophagitis: Secondary | ICD-10-CM | POA: Diagnosis not present

## 2020-04-10 DIAGNOSIS — M170 Bilateral primary osteoarthritis of knee: Secondary | ICD-10-CM | POA: Diagnosis not present

## 2020-04-10 DIAGNOSIS — E039 Hypothyroidism, unspecified: Secondary | ICD-10-CM | POA: Diagnosis not present

## 2020-04-10 DIAGNOSIS — I252 Old myocardial infarction: Secondary | ICD-10-CM | POA: Diagnosis not present

## 2020-04-10 DIAGNOSIS — J9620 Acute and chronic respiratory failure, unspecified whether with hypoxia or hypercapnia: Secondary | ICD-10-CM | POA: Diagnosis not present

## 2020-04-10 DIAGNOSIS — I130 Hypertensive heart and chronic kidney disease with heart failure and stage 1 through stage 4 chronic kidney disease, or unspecified chronic kidney disease: Secondary | ICD-10-CM | POA: Diagnosis not present

## 2020-04-10 DIAGNOSIS — E785 Hyperlipidemia, unspecified: Secondary | ICD-10-CM | POA: Diagnosis not present

## 2020-04-10 DIAGNOSIS — I5032 Chronic diastolic (congestive) heart failure: Secondary | ICD-10-CM | POA: Diagnosis not present

## 2020-04-10 DIAGNOSIS — L97811 Non-pressure chronic ulcer of other part of right lower leg limited to breakdown of skin: Secondary | ICD-10-CM | POA: Diagnosis not present

## 2020-04-10 DIAGNOSIS — J449 Chronic obstructive pulmonary disease, unspecified: Secondary | ICD-10-CM | POA: Diagnosis not present

## 2020-04-10 DIAGNOSIS — I824Z2 Acute embolism and thrombosis of unspecified deep veins of left distal lower extremity: Secondary | ICD-10-CM | POA: Diagnosis not present

## 2020-04-10 DIAGNOSIS — E1122 Type 2 diabetes mellitus with diabetic chronic kidney disease: Secondary | ICD-10-CM | POA: Diagnosis not present

## 2020-04-10 DIAGNOSIS — G2581 Restless legs syndrome: Secondary | ICD-10-CM | POA: Diagnosis not present

## 2020-04-10 DIAGNOSIS — N183 Chronic kidney disease, stage 3 unspecified: Secondary | ICD-10-CM | POA: Diagnosis not present

## 2020-04-10 DIAGNOSIS — M797 Fibromyalgia: Secondary | ICD-10-CM | POA: Diagnosis not present

## 2020-04-10 DIAGNOSIS — I482 Chronic atrial fibrillation, unspecified: Secondary | ICD-10-CM | POA: Diagnosis not present

## 2020-04-10 DIAGNOSIS — I872 Venous insufficiency (chronic) (peripheral): Secondary | ICD-10-CM | POA: Diagnosis not present

## 2020-04-10 DIAGNOSIS — I25119 Atherosclerotic heart disease of native coronary artery with unspecified angina pectoris: Secondary | ICD-10-CM | POA: Diagnosis not present

## 2020-04-10 DIAGNOSIS — G4733 Obstructive sleep apnea (adult) (pediatric): Secondary | ICD-10-CM | POA: Diagnosis not present

## 2020-04-10 DIAGNOSIS — E1142 Type 2 diabetes mellitus with diabetic polyneuropathy: Secondary | ICD-10-CM | POA: Diagnosis not present

## 2020-04-12 ENCOUNTER — Other Ambulatory Visit: Payer: Self-pay

## 2020-04-12 ENCOUNTER — Ambulatory Visit: Payer: Medicare Other | Admitting: Podiatry

## 2020-04-12 DIAGNOSIS — Z89619 Acquired absence of unspecified leg above knee: Secondary | ICD-10-CM

## 2020-04-12 DIAGNOSIS — L97511 Non-pressure chronic ulcer of other part of right foot limited to breakdown of skin: Secondary | ICD-10-CM

## 2020-04-12 DIAGNOSIS — E11621 Type 2 diabetes mellitus with foot ulcer: Secondary | ICD-10-CM | POA: Diagnosis not present

## 2020-04-12 DIAGNOSIS — M2041 Other hammer toe(s) (acquired), right foot: Secondary | ICD-10-CM | POA: Diagnosis not present

## 2020-04-12 DIAGNOSIS — M2042 Other hammer toe(s) (acquired), left foot: Secondary | ICD-10-CM | POA: Diagnosis not present

## 2020-04-12 DIAGNOSIS — E1169 Type 2 diabetes mellitus with other specified complication: Secondary | ICD-10-CM

## 2020-04-12 NOTE — Telephone Encounter (Signed)
° ° °  °  Received a letter from [place] regarding report made on 03/26/20 by this nurse.  Letter is confirming that the they will evaluate based on the allegations and will refer the report to the DA office and/or Law Enforcement.  Letter sent to batch scan.  Shelle Iron, LPN 42/59/56 3:87 PM

## 2020-04-12 NOTE — Progress Notes (Signed)
  Subjective:  Patient ID: Jon [name], male    DOB: 01/28/1951,  MRN: [id number]  Chief Complaint  Patient presents with  . Ulcer    F/U Rt 2nd toe ulcer check pt. states," toe is about the same, to me it's not healing; with little soreness; 7/10." - same with redness and swelling -Tx: bandaid, neosprin and sx shoe no odor or wamth     69 y.o. male presents for wound care. Hx confirmed with patient. Not wearing his surgical shoe, states it is in his car. Objective:  Physical Exam: Wound Location: Right 2nd toe Wound Measurement: 1.3x1.5 Wound Base: Mixed Granular/Fibrotic Peri-wound: Reddened Exudate: None: wound tissue dry wound without warmth, erythema, signs of acute infection    Assessment:   1. Diabetic ulcer of toe of right foot associated with type 2 diabetes mellitus, limited to breakdown of skin ([place])   2. Hammer toes of both feet   3. 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  Ulcer Right 2nd toe -Dressing applied consisting of medihoney and band-aid -Offload ulcer with surgical shoe. Discussed importance of wearing the shoe. Wound much worse this visit. -Discussed with patient he may need surgical intervention including debridement with or without arthroplasty, and possible amputation if the wound continues to worsen. -XR at next visit for surveillance  Return in about 3 weeks (around 05/03/2020) for Wound Care, Right, with XRs.

## 2020-04-13 ENCOUNTER — Other Ambulatory Visit: Payer: Self-pay | Admitting: Family Medicine

## 2020-04-13 DIAGNOSIS — I5032 Chronic diastolic (congestive) heart failure: Secondary | ICD-10-CM | POA: Diagnosis not present

## 2020-04-13 DIAGNOSIS — G2581 Restless legs syndrome: Secondary | ICD-10-CM | POA: Diagnosis not present

## 2020-04-13 DIAGNOSIS — L97811 Non-pressure chronic ulcer of other part of right lower leg limited to breakdown of skin: Secondary | ICD-10-CM | POA: Diagnosis not present

## 2020-04-13 DIAGNOSIS — N183 Chronic kidney disease, stage 3 unspecified: Secondary | ICD-10-CM | POA: Diagnosis not present

## 2020-04-13 DIAGNOSIS — M170 Bilateral primary osteoarthritis of knee: Secondary | ICD-10-CM | POA: Diagnosis not present

## 2020-04-13 DIAGNOSIS — J449 Chronic obstructive pulmonary disease, unspecified: Secondary | ICD-10-CM | POA: Diagnosis not present

## 2020-04-13 DIAGNOSIS — J9620 Acute and chronic respiratory failure, unspecified whether with hypoxia or hypercapnia: Secondary | ICD-10-CM | POA: Diagnosis not present

## 2020-04-13 DIAGNOSIS — Z8673 Personal history of transient ischemic attack (TIA), and cerebral infarction without residual deficits: Secondary | ICD-10-CM | POA: Diagnosis not present

## 2020-04-13 DIAGNOSIS — G4733 Obstructive sleep apnea (adult) (pediatric): Secondary | ICD-10-CM | POA: Diagnosis not present

## 2020-04-13 DIAGNOSIS — E1142 Type 2 diabetes mellitus with diabetic polyneuropathy: Secondary | ICD-10-CM | POA: Diagnosis not present

## 2020-04-13 DIAGNOSIS — E1151 Type 2 diabetes mellitus with diabetic peripheral angiopathy without gangrene: Secondary | ICD-10-CM | POA: Diagnosis not present

## 2020-04-13 DIAGNOSIS — E039 Hypothyroidism, unspecified: Secondary | ICD-10-CM | POA: Diagnosis not present

## 2020-04-13 DIAGNOSIS — E785 Hyperlipidemia, unspecified: Secondary | ICD-10-CM | POA: Diagnosis not present

## 2020-04-13 DIAGNOSIS — I824Z2 Acute embolism and thrombosis of unspecified deep veins of left distal lower extremity: Secondary | ICD-10-CM | POA: Diagnosis not present

## 2020-04-13 DIAGNOSIS — K219 Gastro-esophageal reflux disease without esophagitis: Secondary | ICD-10-CM | POA: Diagnosis not present

## 2020-04-13 DIAGNOSIS — I252 Old myocardial infarction: Secondary | ICD-10-CM | POA: Diagnosis not present

## 2020-04-13 DIAGNOSIS — Z9981 Dependence on supplemental oxygen: Secondary | ICD-10-CM | POA: Diagnosis not present

## 2020-04-13 DIAGNOSIS — I872 Venous insufficiency (chronic) (peripheral): Secondary | ICD-10-CM | POA: Diagnosis not present

## 2020-04-13 DIAGNOSIS — L97929 Non-pressure chronic ulcer of unspecified part of left lower leg with unspecified severity: Secondary | ICD-10-CM | POA: Diagnosis not present

## 2020-04-13 DIAGNOSIS — E1122 Type 2 diabetes mellitus with diabetic chronic kidney disease: Secondary | ICD-10-CM | POA: Diagnosis not present

## 2020-04-13 DIAGNOSIS — M797 Fibromyalgia: Secondary | ICD-10-CM | POA: Diagnosis not present

## 2020-04-13 DIAGNOSIS — I482 Chronic atrial fibrillation, unspecified: Secondary | ICD-10-CM | POA: Diagnosis not present

## 2020-04-13 DIAGNOSIS — I130 Hypertensive heart and chronic kidney disease with heart failure and stage 1 through stage 4 chronic kidney disease, or unspecified chronic kidney disease: Secondary | ICD-10-CM | POA: Diagnosis not present

## 2020-04-13 DIAGNOSIS — I25119 Atherosclerotic heart disease of native coronary artery with unspecified angina pectoris: Secondary | ICD-10-CM | POA: Diagnosis not present

## 2020-04-16 ENCOUNTER — Other Ambulatory Visit: Payer: Self-pay | Admitting: Family Medicine

## 2020-04-16 ENCOUNTER — Other Ambulatory Visit: Payer: Self-pay | Admitting: Physician Assistant

## 2020-04-16 DIAGNOSIS — I482 Chronic atrial fibrillation, unspecified: Secondary | ICD-10-CM | POA: Diagnosis not present

## 2020-04-16 DIAGNOSIS — I252 Old myocardial infarction: Secondary | ICD-10-CM | POA: Diagnosis not present

## 2020-04-16 DIAGNOSIS — L97811 Non-pressure chronic ulcer of other part of right lower leg limited to breakdown of skin: Secondary | ICD-10-CM | POA: Diagnosis not present

## 2020-04-16 DIAGNOSIS — K219 Gastro-esophageal reflux disease without esophagitis: Secondary | ICD-10-CM | POA: Diagnosis not present

## 2020-04-16 DIAGNOSIS — G2581 Restless legs syndrome: Secondary | ICD-10-CM | POA: Diagnosis not present

## 2020-04-16 DIAGNOSIS — G4733 Obstructive sleep apnea (adult) (pediatric): Secondary | ICD-10-CM | POA: Diagnosis not present

## 2020-04-16 DIAGNOSIS — E1142 Type 2 diabetes mellitus with diabetic polyneuropathy: Secondary | ICD-10-CM | POA: Diagnosis not present

## 2020-04-16 DIAGNOSIS — E1122 Type 2 diabetes mellitus with diabetic chronic kidney disease: Secondary | ICD-10-CM | POA: Diagnosis not present

## 2020-04-16 DIAGNOSIS — L97929 Non-pressure chronic ulcer of unspecified part of left lower leg with unspecified severity: Secondary | ICD-10-CM | POA: Diagnosis not present

## 2020-04-16 DIAGNOSIS — E1151 Type 2 diabetes mellitus with diabetic peripheral angiopathy without gangrene: Secondary | ICD-10-CM | POA: Diagnosis not present

## 2020-04-16 DIAGNOSIS — J9620 Acute and chronic respiratory failure, unspecified whether with hypoxia or hypercapnia: Secondary | ICD-10-CM | POA: Diagnosis not present

## 2020-04-16 DIAGNOSIS — Z8673 Personal history of transient ischemic attack (TIA), and cerebral infarction without residual deficits: Secondary | ICD-10-CM | POA: Diagnosis not present

## 2020-04-16 DIAGNOSIS — E039 Hypothyroidism, unspecified: Secondary | ICD-10-CM | POA: Diagnosis not present

## 2020-04-16 DIAGNOSIS — Z9981 Dependence on supplemental oxygen: Secondary | ICD-10-CM | POA: Diagnosis not present

## 2020-04-16 DIAGNOSIS — M170 Bilateral primary osteoarthritis of knee: Secondary | ICD-10-CM | POA: Diagnosis not present

## 2020-04-16 DIAGNOSIS — I25119 Atherosclerotic heart disease of native coronary artery with unspecified angina pectoris: Secondary | ICD-10-CM | POA: Diagnosis not present

## 2020-04-16 DIAGNOSIS — I824Z2 Acute embolism and thrombosis of unspecified deep veins of left distal lower extremity: Secondary | ICD-10-CM | POA: Diagnosis not present

## 2020-04-16 DIAGNOSIS — I872 Venous insufficiency (chronic) (peripheral): Secondary | ICD-10-CM | POA: Diagnosis not present

## 2020-04-16 DIAGNOSIS — N183 Chronic kidney disease, stage 3 unspecified: Secondary | ICD-10-CM | POA: Diagnosis not present

## 2020-04-16 DIAGNOSIS — I130 Hypertensive heart and chronic kidney disease with heart failure and stage 1 through stage 4 chronic kidney disease, or unspecified chronic kidney disease: Secondary | ICD-10-CM | POA: Diagnosis not present

## 2020-04-16 DIAGNOSIS — I5032 Chronic diastolic (congestive) heart failure: Secondary | ICD-10-CM | POA: Diagnosis not present

## 2020-04-16 DIAGNOSIS — M797 Fibromyalgia: Secondary | ICD-10-CM | POA: Diagnosis not present

## 2020-04-16 DIAGNOSIS — E785 Hyperlipidemia, unspecified: Secondary | ICD-10-CM | POA: Diagnosis not present

## 2020-04-16 DIAGNOSIS — J449 Chronic obstructive pulmonary disease, unspecified: Secondary | ICD-10-CM | POA: Diagnosis not present

## 2020-04-16 NOTE — Progress Notes (Signed)
Reviewed. Kc  

## 2020-04-17 ENCOUNTER — Other Ambulatory Visit: Payer: Self-pay | Admitting: Family Medicine

## 2020-04-17 DIAGNOSIS — Z8673 Personal history of transient ischemic attack (TIA), and cerebral infarction without residual deficits: Secondary | ICD-10-CM | POA: Diagnosis not present

## 2020-04-17 DIAGNOSIS — I824Z2 Acute embolism and thrombosis of unspecified deep veins of left distal lower extremity: Secondary | ICD-10-CM | POA: Diagnosis not present

## 2020-04-17 DIAGNOSIS — E785 Hyperlipidemia, unspecified: Secondary | ICD-10-CM | POA: Diagnosis not present

## 2020-04-17 DIAGNOSIS — I5032 Chronic diastolic (congestive) heart failure: Secondary | ICD-10-CM | POA: Diagnosis not present

## 2020-04-17 DIAGNOSIS — L97811 Non-pressure chronic ulcer of other part of right lower leg limited to breakdown of skin: Secondary | ICD-10-CM | POA: Diagnosis not present

## 2020-04-17 DIAGNOSIS — I252 Old myocardial infarction: Secondary | ICD-10-CM | POA: Diagnosis not present

## 2020-04-17 DIAGNOSIS — E1151 Type 2 diabetes mellitus with diabetic peripheral angiopathy without gangrene: Secondary | ICD-10-CM | POA: Diagnosis not present

## 2020-04-17 DIAGNOSIS — E1122 Type 2 diabetes mellitus with diabetic chronic kidney disease: Secondary | ICD-10-CM | POA: Diagnosis not present

## 2020-04-17 DIAGNOSIS — J449 Chronic obstructive pulmonary disease, unspecified: Secondary | ICD-10-CM | POA: Diagnosis not present

## 2020-04-17 DIAGNOSIS — I25119 Atherosclerotic heart disease of native coronary artery with unspecified angina pectoris: Secondary | ICD-10-CM | POA: Diagnosis not present

## 2020-04-17 DIAGNOSIS — I130 Hypertensive heart and chronic kidney disease with heart failure and stage 1 through stage 4 chronic kidney disease, or unspecified chronic kidney disease: Secondary | ICD-10-CM | POA: Diagnosis not present

## 2020-04-17 DIAGNOSIS — E1142 Type 2 diabetes mellitus with diabetic polyneuropathy: Secondary | ICD-10-CM | POA: Diagnosis not present

## 2020-04-17 DIAGNOSIS — E039 Hypothyroidism, unspecified: Secondary | ICD-10-CM | POA: Diagnosis not present

## 2020-04-17 DIAGNOSIS — I872 Venous insufficiency (chronic) (peripheral): Secondary | ICD-10-CM | POA: Diagnosis not present

## 2020-04-17 DIAGNOSIS — G2581 Restless legs syndrome: Secondary | ICD-10-CM | POA: Diagnosis not present

## 2020-04-17 DIAGNOSIS — I482 Chronic atrial fibrillation, unspecified: Secondary | ICD-10-CM | POA: Diagnosis not present

## 2020-04-17 DIAGNOSIS — L97929 Non-pressure chronic ulcer of unspecified part of left lower leg with unspecified severity: Secondary | ICD-10-CM | POA: Diagnosis not present

## 2020-04-17 DIAGNOSIS — M170 Bilateral primary osteoarthritis of knee: Secondary | ICD-10-CM | POA: Diagnosis not present

## 2020-04-17 DIAGNOSIS — N183 Chronic kidney disease, stage 3 unspecified: Secondary | ICD-10-CM | POA: Diagnosis not present

## 2020-04-17 DIAGNOSIS — Z9981 Dependence on supplemental oxygen: Secondary | ICD-10-CM | POA: Diagnosis not present

## 2020-04-17 DIAGNOSIS — K219 Gastro-esophageal reflux disease without esophagitis: Secondary | ICD-10-CM | POA: Diagnosis not present

## 2020-04-17 DIAGNOSIS — J9620 Acute and chronic respiratory failure, unspecified whether with hypoxia or hypercapnia: Secondary | ICD-10-CM | POA: Diagnosis not present

## 2020-04-17 DIAGNOSIS — M797 Fibromyalgia: Secondary | ICD-10-CM | POA: Diagnosis not present

## 2020-04-17 DIAGNOSIS — G4733 Obstructive sleep apnea (adult) (pediatric): Secondary | ICD-10-CM | POA: Diagnosis not present

## 2020-04-19 DIAGNOSIS — G4733 Obstructive sleep apnea (adult) (pediatric): Secondary | ICD-10-CM | POA: Diagnosis not present

## 2020-04-20 DIAGNOSIS — E785 Hyperlipidemia, unspecified: Secondary | ICD-10-CM | POA: Diagnosis not present

## 2020-04-20 DIAGNOSIS — M797 Fibromyalgia: Secondary | ICD-10-CM | POA: Diagnosis not present

## 2020-04-20 DIAGNOSIS — I5032 Chronic diastolic (congestive) heart failure: Secondary | ICD-10-CM | POA: Diagnosis not present

## 2020-04-20 DIAGNOSIS — G4733 Obstructive sleep apnea (adult) (pediatric): Secondary | ICD-10-CM | POA: Diagnosis not present

## 2020-04-20 DIAGNOSIS — L97929 Non-pressure chronic ulcer of unspecified part of left lower leg with unspecified severity: Secondary | ICD-10-CM | POA: Diagnosis not present

## 2020-04-20 DIAGNOSIS — I824Z2 Acute embolism and thrombosis of unspecified deep veins of left distal lower extremity: Secondary | ICD-10-CM | POA: Diagnosis not present

## 2020-04-20 DIAGNOSIS — I25119 Atherosclerotic heart disease of native coronary artery with unspecified angina pectoris: Secondary | ICD-10-CM | POA: Diagnosis not present

## 2020-04-20 DIAGNOSIS — I252 Old myocardial infarction: Secondary | ICD-10-CM | POA: Diagnosis not present

## 2020-04-20 DIAGNOSIS — N183 Chronic kidney disease, stage 3 unspecified: Secondary | ICD-10-CM | POA: Diagnosis not present

## 2020-04-20 DIAGNOSIS — Z9981 Dependence on supplemental oxygen: Secondary | ICD-10-CM | POA: Diagnosis not present

## 2020-04-20 DIAGNOSIS — K219 Gastro-esophageal reflux disease without esophagitis: Secondary | ICD-10-CM | POA: Diagnosis not present

## 2020-04-20 DIAGNOSIS — L97811 Non-pressure chronic ulcer of other part of right lower leg limited to breakdown of skin: Secondary | ICD-10-CM | POA: Diagnosis not present

## 2020-04-20 DIAGNOSIS — Z8673 Personal history of transient ischemic attack (TIA), and cerebral infarction without residual deficits: Secondary | ICD-10-CM | POA: Diagnosis not present

## 2020-04-20 DIAGNOSIS — E1142 Type 2 diabetes mellitus with diabetic polyneuropathy: Secondary | ICD-10-CM | POA: Diagnosis not present

## 2020-04-20 DIAGNOSIS — J449 Chronic obstructive pulmonary disease, unspecified: Secondary | ICD-10-CM | POA: Diagnosis not present

## 2020-04-20 DIAGNOSIS — I482 Chronic atrial fibrillation, unspecified: Secondary | ICD-10-CM | POA: Diagnosis not present

## 2020-04-20 DIAGNOSIS — J9620 Acute and chronic respiratory failure, unspecified whether with hypoxia or hypercapnia: Secondary | ICD-10-CM | POA: Diagnosis not present

## 2020-04-20 DIAGNOSIS — G2581 Restless legs syndrome: Secondary | ICD-10-CM | POA: Diagnosis not present

## 2020-04-20 DIAGNOSIS — M170 Bilateral primary osteoarthritis of knee: Secondary | ICD-10-CM | POA: Diagnosis not present

## 2020-04-20 DIAGNOSIS — E039 Hypothyroidism, unspecified: Secondary | ICD-10-CM | POA: Diagnosis not present

## 2020-04-20 DIAGNOSIS — E1122 Type 2 diabetes mellitus with diabetic chronic kidney disease: Secondary | ICD-10-CM | POA: Diagnosis not present

## 2020-04-20 DIAGNOSIS — I130 Hypertensive heart and chronic kidney disease with heart failure and stage 1 through stage 4 chronic kidney disease, or unspecified chronic kidney disease: Secondary | ICD-10-CM | POA: Diagnosis not present

## 2020-04-20 DIAGNOSIS — E1151 Type 2 diabetes mellitus with diabetic peripheral angiopathy without gangrene: Secondary | ICD-10-CM | POA: Diagnosis not present

## 2020-04-20 DIAGNOSIS — I872 Venous insufficiency (chronic) (peripheral): Secondary | ICD-10-CM | POA: Diagnosis not present

## 2020-04-23 ENCOUNTER — Telehealth: Payer: Self-pay

## 2020-04-23 DIAGNOSIS — K219 Gastro-esophageal reflux disease without esophagitis: Secondary | ICD-10-CM | POA: Diagnosis not present

## 2020-04-23 DIAGNOSIS — G4733 Obstructive sleep apnea (adult) (pediatric): Secondary | ICD-10-CM | POA: Diagnosis not present

## 2020-04-23 DIAGNOSIS — E1122 Type 2 diabetes mellitus with diabetic chronic kidney disease: Secondary | ICD-10-CM | POA: Diagnosis not present

## 2020-04-23 DIAGNOSIS — N183 Chronic kidney disease, stage 3 unspecified: Secondary | ICD-10-CM | POA: Diagnosis not present

## 2020-04-23 DIAGNOSIS — E039 Hypothyroidism, unspecified: Secondary | ICD-10-CM | POA: Diagnosis not present

## 2020-04-23 DIAGNOSIS — Z8673 Personal history of transient ischemic attack (TIA), and cerebral infarction without residual deficits: Secondary | ICD-10-CM | POA: Diagnosis not present

## 2020-04-23 DIAGNOSIS — I252 Old myocardial infarction: Secondary | ICD-10-CM | POA: Diagnosis not present

## 2020-04-23 DIAGNOSIS — J449 Chronic obstructive pulmonary disease, unspecified: Secondary | ICD-10-CM | POA: Diagnosis not present

## 2020-04-23 DIAGNOSIS — E785 Hyperlipidemia, unspecified: Secondary | ICD-10-CM | POA: Diagnosis not present

## 2020-04-23 DIAGNOSIS — E1151 Type 2 diabetes mellitus with diabetic peripheral angiopathy without gangrene: Secondary | ICD-10-CM | POA: Diagnosis not present

## 2020-04-23 DIAGNOSIS — J9620 Acute and chronic respiratory failure, unspecified whether with hypoxia or hypercapnia: Secondary | ICD-10-CM | POA: Diagnosis not present

## 2020-04-23 DIAGNOSIS — I872 Venous insufficiency (chronic) (peripheral): Secondary | ICD-10-CM | POA: Diagnosis not present

## 2020-04-23 DIAGNOSIS — M170 Bilateral primary osteoarthritis of knee: Secondary | ICD-10-CM | POA: Diagnosis not present

## 2020-04-23 DIAGNOSIS — E1142 Type 2 diabetes mellitus with diabetic polyneuropathy: Secondary | ICD-10-CM | POA: Diagnosis not present

## 2020-04-23 DIAGNOSIS — I824Z2 Acute embolism and thrombosis of unspecified deep veins of left distal lower extremity: Secondary | ICD-10-CM | POA: Diagnosis not present

## 2020-04-23 DIAGNOSIS — M797 Fibromyalgia: Secondary | ICD-10-CM | POA: Diagnosis not present

## 2020-04-23 DIAGNOSIS — I25119 Atherosclerotic heart disease of native coronary artery with unspecified angina pectoris: Secondary | ICD-10-CM | POA: Diagnosis not present

## 2020-04-23 DIAGNOSIS — L97811 Non-pressure chronic ulcer of other part of right lower leg limited to breakdown of skin: Secondary | ICD-10-CM | POA: Diagnosis not present

## 2020-04-23 DIAGNOSIS — Z9981 Dependence on supplemental oxygen: Secondary | ICD-10-CM | POA: Diagnosis not present

## 2020-04-23 DIAGNOSIS — I5032 Chronic diastolic (congestive) heart failure: Secondary | ICD-10-CM | POA: Diagnosis not present

## 2020-04-23 DIAGNOSIS — I130 Hypertensive heart and chronic kidney disease with heart failure and stage 1 through stage 4 chronic kidney disease, or unspecified chronic kidney disease: Secondary | ICD-10-CM | POA: Diagnosis not present

## 2020-04-23 DIAGNOSIS — I482 Chronic atrial fibrillation, unspecified: Secondary | ICD-10-CM | POA: Diagnosis not present

## 2020-04-23 DIAGNOSIS — L97929 Non-pressure chronic ulcer of unspecified part of left lower leg with unspecified severity: Secondary | ICD-10-CM | POA: Diagnosis not present

## 2020-04-23 DIAGNOSIS — G2581 Restless legs syndrome: Secondary | ICD-10-CM | POA: Diagnosis not present

## 2020-04-23 NOTE — Telephone Encounter (Signed)
° ° °  °  Received notice from [place] regarding home visit for Jon [name].  Based on their evaluation it was determined that there is not a need for protective services at this time.  Shelle Iron, LPN 22/02/54 2:70 PM

## 2020-04-24 ENCOUNTER — Other Ambulatory Visit: Payer: Self-pay | Admitting: Family Medicine

## 2020-04-24 ENCOUNTER — Telehealth: Payer: Self-pay

## 2020-04-24 ENCOUNTER — Encounter: Payer: Self-pay | Admitting: Family Medicine

## 2020-04-24 DIAGNOSIS — E1142 Type 2 diabetes mellitus with diabetic polyneuropathy: Secondary | ICD-10-CM | POA: Diagnosis not present

## 2020-04-24 DIAGNOSIS — E1151 Type 2 diabetes mellitus with diabetic peripheral angiopathy without gangrene: Secondary | ICD-10-CM | POA: Diagnosis not present

## 2020-04-24 DIAGNOSIS — K219 Gastro-esophageal reflux disease without esophagitis: Secondary | ICD-10-CM | POA: Diagnosis not present

## 2020-04-24 DIAGNOSIS — I5032 Chronic diastolic (congestive) heart failure: Secondary | ICD-10-CM | POA: Diagnosis not present

## 2020-04-24 DIAGNOSIS — E785 Hyperlipidemia, unspecified: Secondary | ICD-10-CM | POA: Diagnosis not present

## 2020-04-24 DIAGNOSIS — M797 Fibromyalgia: Secondary | ICD-10-CM | POA: Diagnosis not present

## 2020-04-24 DIAGNOSIS — I872 Venous insufficiency (chronic) (peripheral): Secondary | ICD-10-CM | POA: Diagnosis not present

## 2020-04-24 DIAGNOSIS — N183 Chronic kidney disease, stage 3 unspecified: Secondary | ICD-10-CM | POA: Diagnosis not present

## 2020-04-24 DIAGNOSIS — G4733 Obstructive sleep apnea (adult) (pediatric): Secondary | ICD-10-CM | POA: Diagnosis not present

## 2020-04-24 DIAGNOSIS — I252 Old myocardial infarction: Secondary | ICD-10-CM | POA: Diagnosis not present

## 2020-04-24 DIAGNOSIS — J9610 Chronic respiratory failure, unspecified whether with hypoxia or hypercapnia: Secondary | ICD-10-CM | POA: Diagnosis not present

## 2020-04-24 DIAGNOSIS — J449 Chronic obstructive pulmonary disease, unspecified: Secondary | ICD-10-CM | POA: Diagnosis not present

## 2020-04-24 DIAGNOSIS — I824Z2 Acute embolism and thrombosis of unspecified deep veins of left distal lower extremity: Secondary | ICD-10-CM | POA: Diagnosis not present

## 2020-04-24 DIAGNOSIS — E1122 Type 2 diabetes mellitus with diabetic chronic kidney disease: Secondary | ICD-10-CM | POA: Diagnosis not present

## 2020-04-24 DIAGNOSIS — G2581 Restless legs syndrome: Secondary | ICD-10-CM | POA: Diagnosis not present

## 2020-04-24 DIAGNOSIS — Z8673 Personal history of transient ischemic attack (TIA), and cerebral infarction without residual deficits: Secondary | ICD-10-CM | POA: Diagnosis not present

## 2020-04-24 DIAGNOSIS — L97811 Non-pressure chronic ulcer of other part of right lower leg limited to breakdown of skin: Secondary | ICD-10-CM | POA: Diagnosis not present

## 2020-04-24 DIAGNOSIS — M170 Bilateral primary osteoarthritis of knee: Secondary | ICD-10-CM | POA: Diagnosis not present

## 2020-04-24 DIAGNOSIS — I130 Hypertensive heart and chronic kidney disease with heart failure and stage 1 through stage 4 chronic kidney disease, or unspecified chronic kidney disease: Secondary | ICD-10-CM | POA: Diagnosis not present

## 2020-04-24 DIAGNOSIS — E039 Hypothyroidism, unspecified: Secondary | ICD-10-CM | POA: Diagnosis not present

## 2020-04-24 DIAGNOSIS — J9620 Acute and chronic respiratory failure, unspecified whether with hypoxia or hypercapnia: Secondary | ICD-10-CM | POA: Diagnosis not present

## 2020-04-24 DIAGNOSIS — Z9981 Dependence on supplemental oxygen: Secondary | ICD-10-CM | POA: Diagnosis not present

## 2020-04-24 DIAGNOSIS — I25119 Atherosclerotic heart disease of native coronary artery with unspecified angina pectoris: Secondary | ICD-10-CM | POA: Diagnosis not present

## 2020-04-24 DIAGNOSIS — I482 Chronic atrial fibrillation, unspecified: Secondary | ICD-10-CM | POA: Diagnosis not present

## 2020-04-24 DIAGNOSIS — L97929 Non-pressure chronic ulcer of unspecified part of left lower leg with unspecified severity: Secondary | ICD-10-CM | POA: Diagnosis not present

## 2020-04-24 NOTE — Telephone Encounter (Signed)
Nurse with [facility] called to inform you that patient had nausea yesterday, has not had a bowel movement since last Thursday. He has been out of movantik for weeks however the pharmacy states he is not eligible for a refill until 05/02/2020 (even tho he was written a 30 day rx). She advised him to go ahead and take some miralax or use a glycerin suppository. He also has a wound on his rt second toe but is notifying Dr. March Rummage since he is who has been addressing his wound. Patient on schedule for tomorrow.

## 2020-04-24 NOTE — Telephone Encounter (Signed)
See if this helps. May pick up [facility] tomorrow. Kc

## 2020-04-24 NOTE — Telephone Encounter (Signed)
Patient states that nurses recommendations did not help, patient was advised per Dr. Tobie Poet to drink 1/2 bottle of magnesium citrate and if no BM 4 hours afterwards to drink the other 1/2 of bottle. Patient expressed understanding and will tell update [place] tomorrow at his appt.

## 2020-04-24 NOTE — Telephone Encounter (Signed)
Can we add him  on for Thursday?

## 2020-04-24 NOTE — Telephone Encounter (Signed)
Levada Dy from [facility] called and LVM stating pt's wound continues to decline. The wound looks larger, increase of drainage and maceration and there is a small bone exposure. Pt is scheduled for a f/u on 9/02. Would pt need to be seen any sooner? Or any new wound care orders ? Thank you

## 2020-04-25 ENCOUNTER — Other Ambulatory Visit: Payer: Self-pay

## 2020-04-25 ENCOUNTER — Encounter: Payer: Self-pay | Admitting: Family Medicine

## 2020-04-25 ENCOUNTER — Ambulatory Visit (INDEPENDENT_AMBULATORY_CARE_PROVIDER_SITE_OTHER): Payer: Medicare Other | Admitting: Family Medicine

## 2020-04-25 VITALS — BP 130/60 | HR 70 | Temp 98.0°F | Resp 18 | Ht 67.0 in | Wt 250.0 lb

## 2020-04-25 DIAGNOSIS — I110 Hypertensive heart disease with heart failure: Secondary | ICD-10-CM | POA: Diagnosis not present

## 2020-04-25 DIAGNOSIS — Z794 Long term (current) use of insulin: Secondary | ICD-10-CM | POA: Diagnosis not present

## 2020-04-25 DIAGNOSIS — N1832 Chronic kidney disease, stage 3b: Secondary | ICD-10-CM

## 2020-04-25 DIAGNOSIS — E1121 Type 2 diabetes mellitus with diabetic nephropathy: Secondary | ICD-10-CM

## 2020-04-25 DIAGNOSIS — L97516 Non-pressure chronic ulcer of other part of right foot with bone involvement without evidence of necrosis: Secondary | ICD-10-CM

## 2020-04-25 DIAGNOSIS — K5903 Drug induced constipation: Secondary | ICD-10-CM

## 2020-04-25 DIAGNOSIS — E11621 Type 2 diabetes mellitus with foot ulcer: Secondary | ICD-10-CM

## 2020-04-25 MED ORDER — OZEMPIC (1 MG/DOSE) 2 MG/1.5ML ~~LOC~~ SOPN: 1.0000 mg | PEN_INJECTOR | SUBCUTANEOUS | 0 refills | Status: DC

## 2020-04-25 MED ORDER — LACTULOSE 20 GM/30ML PO SOLN: 20.0000 g | Freq: Two times a day (BID) | ORAL | 2 refills | Status: DC

## 2020-04-25 NOTE — Patient Instructions (Addendum)
1) Constipation:  Start on lactulose 30 ml twice daily.   Continue relistor  2) Diabetes: Increase ozempic 1 mg once weekly.   3 Hypertension:  Come back with list tomorrow of medicines to confirm doses.  Keep appointment with foot doctor.

## 2020-04-25 NOTE — Progress Notes (Signed)
Subjective:  Patient ID: Jon [name], male    DOB: 16-Feb-1951  Age: 69 y.o. MRN: [id number]  Chief Complaint  Patient presents with  . Diabetes  . Hypertension   HPI: Preoperative clearance  Hypertensive [name] disease with [name] failure ([place]) Patient does not know his blood pressure medications off the top of his head.  He is supposed to bring me a medication list tomorrow on his way to the podiatrist.  Today his blood pressure looks good 130/60. He was instructed to follow up with Dr. Bettina Gavia. Scheduled 05/03/2020.   Type 2 diabetes mellitus with stage 3b chronic kidney disease, with long-term current use of insulin (HCC) His sugars have stabilized, but are still up in the evenings.  He is currently on levemir 40 U daily and ozempic 0.5 mg (once weekly. His sugars are form 100-440.  Patient has an ulceration on his second digit of his right foot.  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  He is scheduled to see podiatry tomorrow.  Per the patient the podiatrist says he infections in the bone and in fact can see the bone on the dorsal surface of the toe.  Denies fever.  Patient has significant constipation.  This is multifactorial but for the most part narcotics related.  I did decrease his calcium channel blocker in the hopes that this would help with the constipation and his last visit.  I have not been able to get his Movantik approved.  He is currently taking Relistor 150 mg 1 pill 3 times daily and Colace 100 mg 2 daily..  In the past he has tried Amitiza, Linzess, MiraLAX, and multiple over-the-counter stool softeners.   Current Outpatient Medications on File Prior to Visit  Medication Sig Dispense Refill  . albuterol (PROVENTIL) (2.5 MG/3ML) 0.083% nebulizer solution Take 3 mLs (2.5 mg total) by nebulization 2 (two) times daily. And as needed (Patient taking differently: Take 2.5 mg by nebulization 2 (two) times daily as needed for wheezing. ) 75 mL 5  . atorvastatin  (LIPITOR) 40 MG tablet Take 40 mg by mouth daily.    . Azelastine HCl 0.15 % SOLN Place 2 sprays into both nostrils daily.     . BD PEN NEEDLE NANO U/F 32G X 4 MM MISC     . clopidogrel (PLAVIX) 75 MG tablet Take 1 tablet (75 mg total) by mouth daily. 30 tablet 5  . docusate sodium (COLACE) 100 MG capsule Take 100 mg by mouth 2 (two) times daily.    . ferrous sulfate 325 (65 FE) MG tablet Take 325 mg by mouth daily.    . Glucagon, rDNA, (GLUCAGON EMERGENCY) 1 MG KIT Inject 1 mg as directed as needed (low blood sugar).     . insulin detemir (LEVEMIR FLEXTOUCH) 100 UNIT/ML FlexPen Inject 65 Units into the skin daily. (Patient taking differently: Inject 40 Units into the skin daily. ) 30 mL 2  . metolazone (ZAROXOLYN) 2.5 MG tablet TAKE 1 TABLET BY MOUTH DAILY 3 HOURS PRIOR TO LASIX DAILY (Patient taking differently: Take 2.5 mg by mouth daily. 3 hours prior to torsemide) 36 tablet 3  . MOVANTIK 25 MG TABS tablet Take 25 mg by mouth daily.     . nitroGLYCERIN (NITROSTAT) 0.4 MG SL tablet Place 0.4 mg under the tongue every 5 (five) minutes as needed for chest pain.     . OXYGEN Inhale into the lungs. 2 liters at bedtime    . pantoprazole (PROTONIX)  40 MG tablet Take 40 mg by mouth in the morning.     . Potassium Chloride ER 20 MEQ TBCR Take 40 mEq by mouth 2 (two) times daily.     . pregabalin (LYRICA) 75 MG capsule TAKE 1 CAPSULE BY MOUTH TWICE DAILY (Patient taking differently: Take 75 mg by mouth 2 (two) times daily. ) 180 capsule 2  . RELISTOR 150 MG TABS TAKE 3 TABLETS BY MOUTH EVERY MORNING (Patient taking differently: Take 450 mg by mouth daily. ) 90 tablet 0  . torsemide (DEMADEX) 20 MG tablet Take 3 tablets (60 mg total) by mouth 2 (two) times daily. 180 tablet 0  . Vitamin D, Ergocalciferol, (DRISDOL) 1.25 MG (50000 UNIT) CAPS capsule TAKE 1 CAPSULE BY MOUTH DAILY (Patient not taking: Reported on 04/27/2020) 90 capsule 1  . XARELTO 15 MG TABS tablet TAKE 1 TABLET BY MOUTH ONCE DAILY (Patient  taking differently: Take 15 mg by mouth at bedtime. ) 90 tablet 2   No current facility-administered medications on file prior to visit.   Past Medical History:  Diagnosis Date  . Abnormal EKG 11/29/2014  . Acute renal insufficiency 11/29/2014  . Angina   . Arthritis    "knees" (01/18/2014)  . Asthma   . Atrial fibrillation ([place])   . Back pain 01/25/2015  . 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 BOOP (bronchiolitis obliterans with organizing pneumonia) ([place]) 01/20/2014   OLBx 01/20/14 BOOP Started steroids 02/28/14   . CHF (congestive [name] failure) ([place])   . Chronic anticoagulation-Xarelto 02/09/2014  . Chronic atrial fibrillation ([place]) 01/03/2014  . Chronic bronchitis ([place]) 12/21/2013  . Chronic diastolic [name] failure ([place]) 02/22/2015  . Chronic pain of both knees 03/18/2017   Overview:  Added automatically from request for surgery [id number]  . Chronic respiratory failure ([place]) 04/10/2015  . COPD (chronic obstructive pulmonary disease) ([place]) 01/25/2015  . Cough    coughing up blood- started last nite, seems to be worse now  . CVA (cerebral vascular accident) ([place]) 02/09/2014  . Dysrhythmia    atrial fib takes cardizem,   . Fibromyalgia   . GERD (gastroesophageal reflux disease)   . H/O hiatal hernia   . Hemoptysis 08/01/2015  . Herpes zoster 06/26/2015  . History of stroke   . Hyperkalemia-repeat pending 11/29/2014  . Hyperlipemia   . Hyperlipidemia   . Hypertension   . Hypothyroidism   . Long term (current) use of anticoagulants 03/21/2014  . Mild CAD 02/20/2015   Overview:  On recent cardiac cath  Overview:  On recent cardiac cath  . Myocardial infarction [facility])    "one dr says yes; another says no"  . Neuromuscular disorder (HCC)    rls, neuropathy  . Neuropathy    rls, neuropathy   . Normal coronary arteries 2011 11/29/2014  . Obesity (BMI 30-39.9)   . On home oxygen therapy    "2L w/CPAP at bedtime" (09/01/2018)  . OSA on CPAP    cpap & oxygen  . Pericardial effusion   .  Peripheral vascular disease ([place])   . Peritoneal effusion, chronic 02/22/2015   Overview:  With surgical window  . PNA (pneumonia) 04/10/2015  . Pneumonia 5/15   "several times" (01/18/2014)  . Precordial pain 01/26/2015  . Preoperative cardiovascular examination 02/24/2016  . Primary osteoarthritis of both knees 03/18/2017   Overview:  Added automatically from request for surgery [id number]  . Restless leg syndrome   . Sleep apnea    cpap & oxygen   . Stroke [facility])  2012   denies residual on 01/18/2014  . Troponin level elevated 11/29/2014  . Uncontrolled type 2 diabetes mellitus with diabetic polyneuropathy, with long-term current use of insulin ([place]) 12/21/2013   type ?    Past Surgical History:  Procedure Laterality Date  . ABDOMINAL AORTOGRAM W/LOWER EXTREMITY N/A 09/08/2018   Procedure: ABDOMINAL AORTOGRAM W/LOWER EXTREMITY;  Surgeon: Marty Heck, MD;  Location: [place] CV LAB;  Service: Cardiovascular;  Laterality: N/A;  . AMPUTATION Right 09/09/2018   Procedure: AMPUTATION RIGHT GREAT TOE;  Surgeon: Waynetta Sandy, MD;  Location: [place];  Service: Vascular;  Laterality: Right;  . APPENDECTOMY    . CARDIAC CATHETERIZATION  X 2 then 03/03/2012    NL LVF, normal coronaries, vessels are small (HPR: Dr. Beatrix Fetters)  . CATARACT EXTRACTION W/ INTRAOCULAR LENS  IMPLANT, BILATERAL Bilateral   . CHOLECYSTECTOMY    . HERNIA REPAIR     UHR  . LAPAROSCOPIC GASTRIC BANDING  2010  . LOWER EXTREMITY ANGIOGRAPHY Left 10/11/2018   Procedure: LOWER EXTREMITY ANGIOGRAPHY;  Surgeon: Waynetta Sandy, MD;  Location: [place] CV LAB;  Service: Cardiovascular;  Laterality: Left;  . PERICARDIAL WINDOW Left 01/24/2014   Procedure: PERICARDIAL WINDOW;  Surgeon: Gaye Pollack, MD;  Location: [place];  Service: Thoracic;  Laterality: Left;  . PERIPHERAL VASCULAR INTERVENTION Right 09/08/2018   Procedure: PERIPHERAL VASCULAR INTERVENTION;  Surgeon: Marty Heck, MD;  Location: [place] CV LAB;  Service: Cardiovascular;  Laterality: Right;  . SINUS EXPLORATION  X 2  . TEE WITHOUT CARDIOVERSION N/A 09/07/2018   Procedure: TRANSESOPHAGEAL ECHOCARDIOGRAM (TEE);  Surgeon: Acie Fredrickson Wonda Cheng, MD;  Location: [place];  Service: Cardiovascular;  Laterality: N/A;  . UMBILICAL HERNIA REPAIR    . VIDEO ASSISTED THORACOSCOPY Left 01/24/2014   Procedure: VIDEO ASSISTED THORACOSCOPY;  Surgeon: Gaye Pollack, MD;  [facility] OR;  Service: Thoracic;  Laterality: Left;  VATS/open lung biopsy  . VIDEO BRONCHOSCOPY Bilateral 12/29/2013   Procedure: VIDEO BRONCHOSCOPY WITHOUT FLUORO;  Surgeon: Elsie Stain, MD;  Location: [facility] END[facility]COPY;  Service: Endoscopy;  Laterality: Bilateral;  . VIDEO BRONCHOSCOPY N/A 01/24/2014   Procedure: VIDEO BRONCHOSCOPY;  Surgeon: Gaye Pollack, MD;  Location: [facility] OR;  Service: Thoracic;  Laterality: N/A;    Family History  Problem Relation Age of Onset  . Cancer Mother   . Stroke Father   . [name] disease Father   . Seizures Sister   . Diabetes Sister   . Anesthesia problems Neg Hx   . Hypotension Neg Hx   . Malignant hyperthermia Neg Hx   . Pseudochol deficiency Neg Hx    Social History   Socioeconomic History  . Marital status: Widowed    Spouse name: Not on file  . Number of children: 3  . Years of education: Not on file  . Highest education level: Not on file  Occupational History  . Not on file  Tobacco Use  . Smoking status: Former Smoker    Types: Cigars    Quit date: 09/01/2006    Years since quitting: 13.6  . Smokeless tobacco: Never Used  Vaping Use  . Vaping Use: Never used  Substance and Sexual Activity  . 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 Drug use: No  . Sexual activity: Never  Other Topics Concern  . Not on file  Social History Narrative  . Not on file   Social Determinants of  Health   Financial Resource Strain:   . Difficulty of Paying Living Expenses: Not on file    Food Insecurity:   . Worried About Charity fundraiser in the Last Year: Not on file  . Ran Out of Food in the Last Year: Not on file  Transportation Needs: No Transportation Needs  . Lack of Transportation (Medical): No  . Lack of Transportation (Non-Medical): No  Physical Activity: Inactive  . Days of Exercise per Week: 0 days  . Minutes of Exercise per Session: 0 min  Stress:   . Feeling of Stress : Not on file  Social Connections:   . Frequency of Communication with Friends and Family: Not on file  . Frequency of Social Gatherings with Friends and Family: Not on file  . Attends Religious Services: Not on file  . Active Member of Clubs or Organizations: Not on file  . Attends Archivist Meetings: Not on file  . Marital Status: Not on file    Review of Systems  Constitutional: Negative for chills, diaphoresis, fatigue and fever.  HENT: Negative for congestion, ear pain and sore throat.   Respiratory: Negative for cough and shortness of breath.   Cardiovascular: Negative for chest pain and leg swelling.  Gastrointestinal: Positive for constipation. Negative for abdominal pain, diarrhea, nausea and vomiting.  Genitourinary: Negative for dysuria and urgency.  Musculoskeletal: Positive for arthralgias (knees) and back pain. Negative for myalgias.  Neurological: Negative for dizziness and headaches.  Psychiatric/Behavioral: Negative for dysphoric mood.     Objective:  BP 130/60 (BP Location: Right Arm, Patient Position: Sitting)   Pulse 70   Temp 98 F (36.7 C) (Temporal)   Resp 18   Ht '5\' 7"'  (1.702 m)   Wt 250 lb (113.4 kg)   BMI 39.16 kg/m   BP/Weight 05/01/2020 04/25/2020 1/61/0960  Systolic BP 454 098 119  Diastolic BP 76 60 64  Wt. (Lbs) - 250 250  BMI 39.16 39.16 39.16    Physical Exam Vitals reviewed.  Constitutional:      Appearance: Normal appearance. He is obese.  Cardiovascular:     Rate and Rhythm: Normal rate and regular rhythm.  Pulmonary:      Effort: Pulmonary effort is normal.     Breath sounds: Normal breath sounds.  Abdominal:     General: Abdomen is flat. Bowel sounds are normal.     Palpations: Abdomen is soft.  Skin:    Comments: Second toe of right foot with ulceration.  Bone is slightly visible.  Tender.  Neurological:     Mental Status: He is alert and oriented to person, place, and time.  Psychiatric:        Mood and Affect: Mood normal.        Behavior: Behavior normal.     Lab Results  Component Value Date   WBC 11.9 (H) 05/01/2020   HGB 13.4 05/01/2020   HCT 40.5 05/01/2020   PLT 309 05/01/2020   GLUCOSE 132 (H) 05/01/2020   CHOL 117 12/13/2019   TRIG 124 12/13/2019   HDL 40 12/13/2019   LDLCALC 55 12/13/2019   ALT 9 03/27/2020   AST 11 03/27/2020   NA 143 05/01/2020   K 3.3 (L) 05/01/2020   CL 101 05/01/2020   CREATININE 2.50 (H) 05/01/2020   BUN 50 (H) 05/01/2020   CO2 29 05/01/2020   TSH 1.560 03/27/2020   INR 1.68 09/01/2018   HGBA1C 8.8 (H) 03/27/2020      Assessment &  Plan:   1. Type 2 diabetes mellitus with stage 3b chronic kidney disease, with long-term current use of insulin (HCC) Control: uncontrolled but improved. Recommend check sugar qac and qhs. Medicines: Increase ozempic 1 mg once weekly. Continue levemir 40 mg once daily. Continue to work on eating a healthy diet.   2. Hypertensive [name] disease with [name] failure (HCC) Improved. Keep appt with cardiology.  No changes to medicines.  Patient to bring his list of medicines by the office tomorrow. Continue to work on eating a healthy diet and exercise.   3. Drug induced constipation Start on lactulose 30 ml twice daily.  Continue relistor  4. Diabetic ulcer of toe of right foot associated with type 2 diabetes mellitus, with bone involvement without evidence of necrosis (HCC) Right foot/toe rewrapped.  Nonstick dressing with antibiotic ointment applied. Patient must keep appointment tomorrow with podiatry.  Per  patient he has been on antibiotics and this is what his toe has looked like.  For this reason we will defer until seen tomorrow.  I would anticipate a toe amputation.   Follow-up: Return in about 2 months (around 06/29/2020) for fasting.  An After Visit Summary was printed and given to the patient.  Rochel Brome [facility] [phone]

## 2020-04-25 NOTE — Telephone Encounter (Signed)
Called Pt and scheduled him for 8/26 at 4pm

## 2020-04-26 ENCOUNTER — Other Ambulatory Visit: Payer: Self-pay | Admitting: Legal Medicine

## 2020-04-26 ENCOUNTER — Ambulatory Visit: Payer: Self-pay | Admitting: Podiatry

## 2020-04-26 ENCOUNTER — Ambulatory Visit (INDEPENDENT_AMBULATORY_CARE_PROVIDER_SITE_OTHER): Payer: Medicare Other | Admitting: Podiatry

## 2020-04-26 ENCOUNTER — Ambulatory Visit (INDEPENDENT_AMBULATORY_CARE_PROVIDER_SITE_OTHER): Payer: Medicare Other

## 2020-04-26 DIAGNOSIS — L02619 Cutaneous abscess of unspecified foot: Secondary | ICD-10-CM | POA: Diagnosis not present

## 2020-04-26 DIAGNOSIS — E11621 Type 2 diabetes mellitus with foot ulcer: Secondary | ICD-10-CM

## 2020-04-26 DIAGNOSIS — L03119 Cellulitis of unspecified part of limb: Secondary | ICD-10-CM

## 2020-04-26 DIAGNOSIS — M86171 Other acute osteomyelitis, right ankle and foot: Secondary | ICD-10-CM | POA: Diagnosis not present

## 2020-04-26 DIAGNOSIS — J9610 Chronic respiratory failure, unspecified whether with hypoxia or hypercapnia: Secondary | ICD-10-CM | POA: Diagnosis not present

## 2020-04-26 DIAGNOSIS — L97511 Non-pressure chronic ulcer of other part of right foot limited to breakdown of skin: Secondary | ICD-10-CM

## 2020-04-26 MED ORDER — DOXYCYCLINE HYCLATE 100 MG PO TABS: 100.0000 mg | ORAL_TABLET | Freq: Two times a day (BID) | ORAL | 0 refills | Status: DC

## 2020-04-26 NOTE — Telephone Encounter (Signed)
Jon [name] needs refill on his Xtampza lp

## 2020-04-27 ENCOUNTER — Telehealth: Payer: Self-pay | Admitting: Podiatry

## 2020-04-27 DIAGNOSIS — M797 Fibromyalgia: Secondary | ICD-10-CM | POA: Diagnosis not present

## 2020-04-27 DIAGNOSIS — I482 Chronic atrial fibrillation, unspecified: Secondary | ICD-10-CM | POA: Diagnosis not present

## 2020-04-27 DIAGNOSIS — L97929 Non-pressure chronic ulcer of unspecified part of left lower leg with unspecified severity: Secondary | ICD-10-CM | POA: Diagnosis not present

## 2020-04-27 DIAGNOSIS — E1151 Type 2 diabetes mellitus with diabetic peripheral angiopathy without gangrene: Secondary | ICD-10-CM | POA: Diagnosis not present

## 2020-04-27 DIAGNOSIS — E039 Hypothyroidism, unspecified: Secondary | ICD-10-CM | POA: Diagnosis not present

## 2020-04-27 DIAGNOSIS — J449 Chronic obstructive pulmonary disease, unspecified: Secondary | ICD-10-CM | POA: Diagnosis not present

## 2020-04-27 DIAGNOSIS — E1122 Type 2 diabetes mellitus with diabetic chronic kidney disease: Secondary | ICD-10-CM | POA: Diagnosis not present

## 2020-04-27 DIAGNOSIS — I824Z2 Acute embolism and thrombosis of unspecified deep veins of left distal lower extremity: Secondary | ICD-10-CM | POA: Diagnosis not present

## 2020-04-27 DIAGNOSIS — G4733 Obstructive sleep apnea (adult) (pediatric): Secondary | ICD-10-CM | POA: Diagnosis not present

## 2020-04-27 DIAGNOSIS — L97811 Non-pressure chronic ulcer of other part of right lower leg limited to breakdown of skin: Secondary | ICD-10-CM | POA: Diagnosis not present

## 2020-04-27 DIAGNOSIS — I252 Old myocardial infarction: Secondary | ICD-10-CM | POA: Diagnosis not present

## 2020-04-27 DIAGNOSIS — Z9981 Dependence on supplemental oxygen: Secondary | ICD-10-CM | POA: Diagnosis not present

## 2020-04-27 DIAGNOSIS — I130 Hypertensive heart and chronic kidney disease with heart failure and stage 1 through stage 4 chronic kidney disease, or unspecified chronic kidney disease: Secondary | ICD-10-CM | POA: Diagnosis not present

## 2020-04-27 DIAGNOSIS — M170 Bilateral primary osteoarthritis of knee: Secondary | ICD-10-CM | POA: Diagnosis not present

## 2020-04-27 DIAGNOSIS — G2581 Restless legs syndrome: Secondary | ICD-10-CM | POA: Diagnosis not present

## 2020-04-27 DIAGNOSIS — Z8673 Personal history of transient ischemic attack (TIA), and cerebral infarction without residual deficits: Secondary | ICD-10-CM | POA: Diagnosis not present

## 2020-04-27 DIAGNOSIS — J9620 Acute and chronic respiratory failure, unspecified whether with hypoxia or hypercapnia: Secondary | ICD-10-CM | POA: Diagnosis not present

## 2020-04-27 DIAGNOSIS — I25119 Atherosclerotic heart disease of native coronary artery with unspecified angina pectoris: Secondary | ICD-10-CM | POA: Diagnosis not present

## 2020-04-27 DIAGNOSIS — I872 Venous insufficiency (chronic) (peripheral): Secondary | ICD-10-CM | POA: Diagnosis not present

## 2020-04-27 DIAGNOSIS — N183 Chronic kidney disease, stage 3 unspecified: Secondary | ICD-10-CM | POA: Diagnosis not present

## 2020-04-27 DIAGNOSIS — I5032 Chronic diastolic (congestive) heart failure: Secondary | ICD-10-CM | POA: Diagnosis not present

## 2020-04-27 DIAGNOSIS — K219 Gastro-esophageal reflux disease without esophagitis: Secondary | ICD-10-CM | POA: Diagnosis not present

## 2020-04-27 DIAGNOSIS — E1142 Type 2 diabetes mellitus with diabetic polyneuropathy: Secondary | ICD-10-CM | POA: Diagnosis not present

## 2020-04-27 DIAGNOSIS — E785 Hyperlipidemia, unspecified: Secondary | ICD-10-CM | POA: Diagnosis not present

## 2020-04-27 NOTE — Telephone Encounter (Signed)
Left voicemail for pt letting him know his sx is scheduled for Friday, 9/3 at 3:30 pm at [facility]. Told him they would be in touch with what time to arrive for pre-op, about getting blood work done, and when to come in for his COVID test. Told pt to please make sure he gets his H&P form filled out and sent back to [place] as quickly as possible. Told him to call Surgery Coordinator line with any questions he may have.

## 2020-04-30 NOTE — Progress Notes (Signed)
DUE TO COVID-19 ONLY ONE VISITOR IS ALLOWED TO COME WITH YOU AND STAY IN THE WAITING ROOM ONLY DURING PRE OP AND PROCEDURE DAY OF SURGERY. THE 1 VISITOR  MAY VISIT WITH YOU AFTER SURGERY IN YOUR PRIVATE ROOM DURING VISITING HOURS ONLY!  YOU NEED TO HAVE A COVID 19 TEST ON__8/31/21 _____ @_______ , THIS TEST MUST BE DONE BEFORE SURGERY,  COVID TESTING SITE [address] [place] [address], IT IS ON THE RIGHT GOING OUT [address] APPROXIMATELY  2 MINUTES PAST [employer] ON THE RIGHT. ONCE YOUR COVID TEST IS COMPLETED,  PLEASE BEGIN THE QUARANTINE INSTRUCTIONS AS OUTLINED IN YOUR HANDOUT.                Jon [name]  04/30/2020   Your procedure is scheduled on: 05/04/2020    Report to [facility] Main  Entrance   Report to admitting    130pm    Call this number if you have problems the morning of surgery [phone]    REMEMBER: NO  SOLID FOOD CANDY OR GUM AFTER MIDNIGHT. CLEAR LIQUIDS   . NOTHING BY MOUTH EXCEPT CLEAR LIQUIDS UNTIL   1230pm . PLEASE FINISH G2 DRINK PER SURGEON ORDER  WHICH NEEDS TO BE COMPLETED AT  1230pm    .      CLEAR LIQUID DIET   Foods Allowed                                                                    Coffee and tea, regular and decaf                            Fruit ices (not with fruit pulp)                                      Iced Popsicles                                    Carbonated beverages, regular and diet                                    Cranberry, grape and apple juices Sports drinks like Gatorade Lightly seasoned clear broth or consume(fat free) Sugar, honey syrup ___________________________________________________________________      BRUSH YOUR TEETH MORNING OF SURGERY AND RINSE YOUR MOUTH OUT, NO CHEWING GUM CANDY OR MINTS.     Take these medicines the morning of surgery with A SIP OF WATER:   DO NOTnebulizer if needed, nasal spray, Doxycycline, Inhalers as usual and bring, Lyrica, Protonix   TAKE ANY DIABETIC MEDICATIONS DAY OF YOUR SURGERY                               You may not have any metal on your body including hair pins and              piercings  Do not  wear jewelry, make-up, lotions, powders or perfumes, deodorant             Do not wear nail polish on your fingernails.  Do not shave  48 hours prior to surgery.              Men may shave face and neck.   Do not bring valuables to the hospital. [place].  Contacts, dentures or bridgework may not be worn into surgery.  Leave suitcase in the car. After surgery it may be brought to your room.     Patients discharged the day of surgery will not be allowed to drive home. IF YOU ARE HAVING SURGERY AND GOING HOME THE SAME DAY, YOU MUST HAVE AN ADULT TO DRIVE YOU HOME AND BE WITH YOU FOR 24 HOURS. YOU MAY GO HOME BY TAXI OR UBER OR ORTHERWISE, BUT AN ADULT MUST ACCOMPANY YOU HOME AND STAY WITH YOU FOR 24 HOURS.  Name and phone number of your driver:  Special Instructions: N/A              Please read over the following fact sheets you were given: _____________________________________________________________________  [facility] - Preparing for Surgery Before surgery, you can play an important role.  Because skin is not sterile, your skin needs to be as free of germs as possible.  You can reduce the number of germs on your skin by washing with CHG (chlorahexidine gluconate) soap before surgery.  CHG is an antiseptic cleaner which kills germs and bonds with the skin to continue killing germs even after washing. Please DO NOT use if you have an allergy to CHG or antibacterial soaps.  If your skin becomes reddened/irritated stop using the CHG and inform your nurse when you arrive at Short Stay. Do not shave (including legs and underarms) for at least 48 hours prior to the first CHG shower.  You may shave your face/neck. Please follow these instructions carefully:  1.  Shower with  CHG Soap the night before surgery and the  morning of Surgery.  2.  If you choose to wash your hair, wash your hair first as usual with your  normal  shampoo.  3.  After you shampoo, rinse your hair and body thoroughly to remove the  shampoo.                           4.  Use CHG as you would any other liquid soap.  You can apply chg directly  to the skin and wash                       Gently with a scrungie or clean washcloth.  5.  Apply the CHG Soap to your body ONLY FROM THE NECK DOWN.   Do not use on face/ open                           Wound or open sores. Avoid contact with eyes, ears mouth and genitals (private parts).                       Wash face,  Genitals (private parts) with your normal soap.             6.  Wash thoroughly, paying  special attention to the area where your surgery  will be performed.  7.  Thoroughly rinse your body with warm water from the neck down.  8.  DO NOT shower/wash with your normal soap after using and rinsing off  the CHG Soap.                9.  Pat yourself dry with a clean towel.            10.  Wear clean pajamas.            11.  Place clean sheets on your bed the night of your first shower and do not  sleep with pets. Day of Surgery : Do not apply any lotions/deodorants the morning of surgery.  Please wear clean clothes to the hospital/surgery center.  FAILURE TO FOLLOW THESE INSTRUCTIONS MAY RESULT IN THE CANCELLATION OF YOUR SURGERY PATIENT SIGNATURE_________________________________  NURSE SIGNATURE__________________________________  ________________________________________________________________________

## 2020-05-01 ENCOUNTER — Other Ambulatory Visit (HOSPITAL_COMMUNITY)
Admission: RE | Admit: 2020-05-01 | Discharge: 2020-05-01 | Disposition: A | Discharge status: Home / Self Care | Payer: Medicare Other | Source: Ambulatory Visit | Attending: Podiatry | Admitting: Podiatry

## 2020-05-01 ENCOUNTER — Encounter: Payer: Self-pay | Admitting: Family Medicine

## 2020-05-01 ENCOUNTER — Encounter (HOSPITAL_COMMUNITY): Payer: Self-pay

## 2020-05-01 ENCOUNTER — Encounter (HOSPITAL_COMMUNITY)
Admission: RE | Admit: 2020-05-01 | Discharge: 2020-05-01 | Disposition: A | Discharge status: Home / Self Care | Payer: Medicare Other | Source: Ambulatory Visit | Attending: Podiatry | Admitting: Podiatry

## 2020-05-01 ENCOUNTER — Other Ambulatory Visit: Payer: Self-pay

## 2020-05-01 DIAGNOSIS — K219 Gastro-esophageal reflux disease without esophagitis: Secondary | ICD-10-CM | POA: Diagnosis not present

## 2020-05-01 DIAGNOSIS — I5032 Chronic diastolic (congestive) heart failure: Secondary | ICD-10-CM | POA: Diagnosis not present

## 2020-05-01 DIAGNOSIS — E669 Obesity, unspecified: Secondary | ICD-10-CM | POA: Insufficient documentation

## 2020-05-01 DIAGNOSIS — M170 Bilateral primary osteoarthritis of knee: Secondary | ICD-10-CM | POA: Diagnosis not present

## 2020-05-01 DIAGNOSIS — I130 Hypertensive heart and chronic kidney disease with heart failure and stage 1 through stage 4 chronic kidney disease, or unspecified chronic kidney disease: Secondary | ICD-10-CM | POA: Diagnosis not present

## 2020-05-01 DIAGNOSIS — I2510 Atherosclerotic heart disease of native coronary artery without angina pectoris: Secondary | ICD-10-CM | POA: Diagnosis not present

## 2020-05-01 DIAGNOSIS — G2581 Restless legs syndrome: Secondary | ICD-10-CM | POA: Diagnosis not present

## 2020-05-01 DIAGNOSIS — Z7901 Long term (current) use of anticoagulants: Secondary | ICD-10-CM | POA: Diagnosis not present

## 2020-05-01 DIAGNOSIS — M797 Fibromyalgia: Secondary | ICD-10-CM | POA: Diagnosis not present

## 2020-05-01 DIAGNOSIS — N183 Chronic kidney disease, stage 3 unspecified: Secondary | ICD-10-CM | POA: Insufficient documentation

## 2020-05-01 DIAGNOSIS — Z79899 Other long term (current) drug therapy: Secondary | ICD-10-CM | POA: Insufficient documentation

## 2020-05-01 DIAGNOSIS — Z7902 Long term (current) use of antithrombotics/antiplatelets: Secondary | ICD-10-CM | POA: Insufficient documentation

## 2020-05-01 DIAGNOSIS — L97514 Non-pressure chronic ulcer of other part of right foot with necrosis of bone: Secondary | ICD-10-CM | POA: Insufficient documentation

## 2020-05-01 DIAGNOSIS — E785 Hyperlipidemia, unspecified: Secondary | ICD-10-CM | POA: Insufficient documentation

## 2020-05-01 DIAGNOSIS — E039 Hypothyroidism, unspecified: Secondary | ICD-10-CM | POA: Insufficient documentation

## 2020-05-01 DIAGNOSIS — Z6839 Body mass index (BMI) 39.0-39.9, adult: Secondary | ICD-10-CM | POA: Diagnosis not present

## 2020-05-01 DIAGNOSIS — I4891 Unspecified atrial fibrillation: Secondary | ICD-10-CM | POA: Insufficient documentation

## 2020-05-01 DIAGNOSIS — I509 Heart failure, unspecified: Secondary | ICD-10-CM | POA: Diagnosis not present

## 2020-05-01 DIAGNOSIS — L97811 Non-pressure chronic ulcer of other part of right lower leg limited to breakdown of skin: Secondary | ICD-10-CM | POA: Diagnosis not present

## 2020-05-01 DIAGNOSIS — J9620 Acute and chronic respiratory failure, unspecified whether with hypoxia or hypercapnia: Secondary | ICD-10-CM | POA: Diagnosis not present

## 2020-05-01 DIAGNOSIS — Z9981 Dependence on supplemental oxygen: Secondary | ICD-10-CM | POA: Insufficient documentation

## 2020-05-01 DIAGNOSIS — Z8673 Personal history of transient ischemic attack (TIA), and cerebral infarction without residual deficits: Secondary | ICD-10-CM | POA: Insufficient documentation

## 2020-05-01 DIAGNOSIS — J449 Chronic obstructive pulmonary disease, unspecified: Secondary | ICD-10-CM | POA: Diagnosis not present

## 2020-05-01 DIAGNOSIS — I824Z2 Acute embolism and thrombosis of unspecified deep veins of left distal lower extremity: Secondary | ICD-10-CM | POA: Diagnosis not present

## 2020-05-01 DIAGNOSIS — I252 Old myocardial infarction: Secondary | ICD-10-CM | POA: Diagnosis not present

## 2020-05-01 DIAGNOSIS — Z794 Long term (current) use of insulin: Secondary | ICD-10-CM | POA: Diagnosis not present

## 2020-05-01 DIAGNOSIS — Z20822 Contact with and (suspected) exposure to covid-19: Secondary | ICD-10-CM | POA: Diagnosis not present

## 2020-05-01 DIAGNOSIS — M86171 Other acute osteomyelitis, right ankle and foot: Secondary | ICD-10-CM | POA: Insufficient documentation

## 2020-05-01 DIAGNOSIS — Z9884 Bariatric surgery status: Secondary | ICD-10-CM | POA: Insufficient documentation

## 2020-05-01 DIAGNOSIS — I25119 Atherosclerotic heart disease of native coronary artery with unspecified angina pectoris: Secondary | ICD-10-CM | POA: Diagnosis not present

## 2020-05-01 DIAGNOSIS — E1122 Type 2 diabetes mellitus with diabetic chronic kidney disease: Secondary | ICD-10-CM | POA: Diagnosis not present

## 2020-05-01 DIAGNOSIS — E1151 Type 2 diabetes mellitus with diabetic peripheral angiopathy without gangrene: Secondary | ICD-10-CM | POA: Diagnosis not present

## 2020-05-01 DIAGNOSIS — G4733 Obstructive sleep apnea (adult) (pediatric): Secondary | ICD-10-CM | POA: Insufficient documentation

## 2020-05-01 DIAGNOSIS — L97929 Non-pressure chronic ulcer of unspecified part of left lower leg with unspecified severity: Secondary | ICD-10-CM | POA: Diagnosis not present

## 2020-05-01 DIAGNOSIS — Z01818 Encounter for other preprocedural examination: Secondary | ICD-10-CM | POA: Diagnosis not present

## 2020-05-01 DIAGNOSIS — E1142 Type 2 diabetes mellitus with diabetic polyneuropathy: Secondary | ICD-10-CM | POA: Diagnosis not present

## 2020-05-01 DIAGNOSIS — I482 Chronic atrial fibrillation, unspecified: Secondary | ICD-10-CM | POA: Diagnosis not present

## 2020-05-01 DIAGNOSIS — I872 Venous insufficiency (chronic) (peripheral): Secondary | ICD-10-CM | POA: Diagnosis not present

## 2020-05-01 HISTORY — DX: Peripheral vascular disease, unspecified: I73.9

## 2020-05-01 HISTORY — DX: Heart failure, unspecified: I50.9

## 2020-05-01 LAB — BASIC METABOLIC PANEL
Anion gap: 13 (ref 5–15)
BUN: 50 mg/dL — ABNORMAL HIGH (ref 8–23)
CO2: 29 mmol/L (ref 22–32)
Calcium: 9.1 mg/dL (ref 8.9–10.3)
Chloride: 101 mmol/L (ref 98–111)
Creatinine, Ser: 2.5 mg/dL — ABNORMAL HIGH (ref 0.61–1.24)
GFR calc Af Amer: 29 mL/min — ABNORMAL LOW (ref >60)
GFR calc non Af Amer: 25 mL/min — ABNORMAL LOW (ref >60)
Glucose, Bld: 132 mg/dL — ABNORMAL HIGH (ref 70–99)
Potassium: 3.3 mmol/L — ABNORMAL LOW (ref 3.5–5.1)
Sodium: 143 mmol/L (ref 135–145)

## 2020-05-01 LAB — CBC
HCT: 40.5 % (ref 39.0–52.0)
Hemoglobin: 13.4 g/dL (ref 13.0–17.0)
MCH: 28.8 pg (ref 26.0–34.0)
MCHC: 33.1 g/dL (ref 30.0–36.0)
MCV: 87.1 fL (ref 80.0–100.0)
Platelets: 309 10*3/uL (ref 150–400)
RBC: 4.65 MIL/uL (ref 4.22–5.81)
RDW: 13.8 % (ref 11.5–15.5)
WBC: 11.9 10*3/uL — ABNORMAL HIGH (ref 4.0–10.5)
nRBC: 0 % (ref 0.0–0.2)

## 2020-05-01 LAB — SARS CORONAVIRUS 2 (TAT 6-24 HRS): SARS Coronavirus 2: NEGATIVE

## 2020-05-01 LAB — GLUCOSE, CAPILLARY: Glucose-Capillary: 131 mg/dL — ABNORMAL HIGH (ref 70–99)

## 2020-05-01 NOTE — Progress Notes (Signed)
BMP doen 05/01/20 routed via epic to Dr March Rummage.

## 2020-05-01 NOTE — Progress Notes (Addendum)
Anesthesia Review:  ENM:[id number] [facility] LOV- 04/25/20- epic Office visit note in epic  Cardiologist : DR Shirlee More Last visit - [place]- 04/2017 per office  Patient not followed by a pulm md per pt  Chest x-ray : EKG :05/01/20  Echo :09/07/18  Stress test: Cardiac Cath :  Activity level:  Can not do a flight of steps  Sleep Study/ CPAP : cpap with oxygen and prn oxygen during day at 2L/[place]  Fasting Blood Sugar :      / Checks Blood Sugar -- times a day:  Checks glucose 4 x daily per pt  Blood Thinner/ Instructions /Last Dose: Xarelto and Plavix - pt has not yet received preprocedure instructions per Xarelto and Plavix Notified surgery scheduler at DR PRice office and asked for H and P -  She states she is working on [employer] and P .   Informed Surgery Scheduler that patient has not yet received instructions in regards to Plavix and Xarelto.   ASA / Instructions/ Last Dose :  HGBa1c- 8.8 on 03/27/20-epic

## 2020-05-02 ENCOUNTER — Encounter: Payer: Self-pay | Admitting: Family Medicine

## 2020-05-02 ENCOUNTER — Telehealth: Payer: Self-pay

## 2020-05-02 ENCOUNTER — Ambulatory Visit (INDEPENDENT_AMBULATORY_CARE_PROVIDER_SITE_OTHER): Payer: Medicare Other | Admitting: Family Medicine

## 2020-05-02 VITALS — BP 118/60 | HR 100 | Temp 98.7°F | Resp 18 | Ht 67.0 in | Wt 251.4 lb

## 2020-05-02 DIAGNOSIS — I110 Hypertensive heart disease with heart failure: Secondary | ICD-10-CM

## 2020-05-02 DIAGNOSIS — L97811 Non-pressure chronic ulcer of other part of right lower leg limited to breakdown of skin: Secondary | ICD-10-CM | POA: Diagnosis not present

## 2020-05-02 DIAGNOSIS — Z01818 Encounter for other preprocedural examination: Secondary | ICD-10-CM

## 2020-05-02 DIAGNOSIS — Z9181 History of falling: Secondary | ICD-10-CM | POA: Diagnosis not present

## 2020-05-02 DIAGNOSIS — J9620 Acute and chronic respiratory failure, unspecified whether with hypoxia or hypercapnia: Secondary | ICD-10-CM | POA: Diagnosis not present

## 2020-05-02 DIAGNOSIS — L97929 Non-pressure chronic ulcer of unspecified part of left lower leg with unspecified severity: Secondary | ICD-10-CM

## 2020-05-02 DIAGNOSIS — Z23 Encounter for immunization: Secondary | ICD-10-CM | POA: Diagnosis not present

## 2020-05-02 DIAGNOSIS — J449 Chronic obstructive pulmonary disease, unspecified: Secondary | ICD-10-CM | POA: Diagnosis not present

## 2020-05-02 DIAGNOSIS — E1122 Type 2 diabetes mellitus with diabetic chronic kidney disease: Secondary | ICD-10-CM | POA: Diagnosis not present

## 2020-05-02 DIAGNOSIS — G2581 Restless legs syndrome: Secondary | ICD-10-CM | POA: Diagnosis not present

## 2020-05-02 DIAGNOSIS — K219 Gastro-esophageal reflux disease without esophagitis: Secondary | ICD-10-CM | POA: Diagnosis not present

## 2020-05-02 DIAGNOSIS — Z9981 Dependence on supplemental oxygen: Secondary | ICD-10-CM | POA: Diagnosis not present

## 2020-05-02 DIAGNOSIS — M170 Bilateral primary osteoarthritis of knee: Secondary | ICD-10-CM | POA: Diagnosis not present

## 2020-05-02 DIAGNOSIS — E785 Hyperlipidemia, unspecified: Secondary | ICD-10-CM | POA: Diagnosis not present

## 2020-05-02 DIAGNOSIS — I739 Peripheral vascular disease, unspecified: Secondary | ICD-10-CM | POA: Diagnosis not present

## 2020-05-02 DIAGNOSIS — I130 Hypertensive heart and chronic kidney disease with heart failure and stage 1 through stage 4 chronic kidney disease, or unspecified chronic kidney disease: Secondary | ICD-10-CM | POA: Diagnosis not present

## 2020-05-02 DIAGNOSIS — I25119 Atherosclerotic heart disease of native coronary artery with unspecified angina pectoris: Secondary | ICD-10-CM | POA: Diagnosis not present

## 2020-05-02 DIAGNOSIS — I482 Chronic atrial fibrillation, unspecified: Secondary | ICD-10-CM | POA: Diagnosis not present

## 2020-05-02 DIAGNOSIS — E039 Hypothyroidism, unspecified: Secondary | ICD-10-CM | POA: Diagnosis not present

## 2020-05-02 DIAGNOSIS — I872 Venous insufficiency (chronic) (peripheral): Secondary | ICD-10-CM | POA: Diagnosis not present

## 2020-05-02 DIAGNOSIS — L97919 Non-pressure chronic ulcer of unspecified part of right lower leg with unspecified severity: Secondary | ICD-10-CM

## 2020-05-02 DIAGNOSIS — E1142 Type 2 diabetes mellitus with diabetic polyneuropathy: Secondary | ICD-10-CM | POA: Diagnosis not present

## 2020-05-02 DIAGNOSIS — Z794 Long term (current) use of insulin: Secondary | ICD-10-CM

## 2020-05-02 DIAGNOSIS — I824Z2 Acute embolism and thrombosis of unspecified deep veins of left distal lower extremity: Secondary | ICD-10-CM | POA: Diagnosis not present

## 2020-05-02 DIAGNOSIS — Z8673 Personal history of transient ischemic attack (TIA), and cerebral infarction without residual deficits: Secondary | ICD-10-CM | POA: Diagnosis not present

## 2020-05-02 DIAGNOSIS — M797 Fibromyalgia: Secondary | ICD-10-CM | POA: Diagnosis not present

## 2020-05-02 DIAGNOSIS — I252 Old myocardial infarction: Secondary | ICD-10-CM | POA: Diagnosis not present

## 2020-05-02 DIAGNOSIS — I5032 Chronic diastolic (congestive) heart failure: Secondary | ICD-10-CM | POA: Diagnosis not present

## 2020-05-02 DIAGNOSIS — E876 Hypokalemia: Secondary | ICD-10-CM

## 2020-05-02 DIAGNOSIS — N183 Chronic kidney disease, stage 3 unspecified: Secondary | ICD-10-CM | POA: Diagnosis not present

## 2020-05-02 DIAGNOSIS — G4733 Obstructive sleep apnea (adult) (pediatric): Secondary | ICD-10-CM | POA: Diagnosis not present

## 2020-05-02 DIAGNOSIS — E1140 Type 2 diabetes mellitus with diabetic neuropathy, unspecified: Secondary | ICD-10-CM

## 2020-05-02 DIAGNOSIS — N184 Chronic kidney disease, stage 4 (severe): Secondary | ICD-10-CM

## 2020-05-02 DIAGNOSIS — E1151 Type 2 diabetes mellitus with diabetic peripheral angiopathy without gangrene: Secondary | ICD-10-CM | POA: Diagnosis not present

## 2020-05-02 MED ORDER — LEVEMIR FLEXTOUCH 100 UNIT/ML ~~LOC~~ SOPN: 40.0000 [IU] | PEN_INJECTOR | Freq: Every day | SUBCUTANEOUS | 11 refills | Status: DC

## 2020-05-02 NOTE — Patient Instructions (Signed)
Recommend patient hold metolazone Thursday and Friday. Recommend taking extra potassium chloride daily through the weekend returning to normal dose on Monday. I would also recommend we get him back established with nephrology.

## 2020-05-02 NOTE — Progress Notes (Signed)
Anesthesia Chart Review   Case: [id number] Date/Time: 05/04/20 1430   Procedure: AMPUTATION TOE RIGHT 2nd TOE (Right Toe)   Anesthesia type: Monitor Anesthesia Care   Pre-op diagnosis: Osteomyelitis, Ulcer with necrosis of bone   Location: [facility] ROOM 03 / [facility]   Surgeons: Evelina Bucy, DPM      DISCUSSION:69 y.o. former smoker (quit 09/01/06) with h/o HTN, GERD, OSA on CPAP, COPD, asthma, a-fib (on Xarelto, will continue), Stroke, DM II, CKD Stage III, Osteomyelitis, ulcer with necrosis of bone scheduled for above procedure 05/04/2020 with Hardie Pulley, DPM.   Pt seen by PCP 05/02/2020 for H&P.  Per OV note, "Patient is cleared for surgery.  Not a significantly invasive surgery however patient has numerous medical issues which unfortunately may lead to complications.  We will continue blood thinners as requested by Dr. March Rummage."  Due to worsening CKD PCP has recommended holding Zaroxolyn 05/03/2020 and DOS.    Anticipate pt can proceed with planned procedure barring acute status change.   VS: BP 137/76   Pulse 73   Temp 37.5 C (Oral)   Resp 16   Ht '5\' 7"'  (1.702 m)   SpO2 98%   BMI 39.16 kg/m   PROVIDERS: [facility] Maxwell, MD is PCP    LABS: Labs reviewed: Acceptable for surgery. (all labs ordered are listed, but only abnormal results are displayed)  Labs Reviewed  BASIC METABOLIC PANEL - Abnormal; Notable for the following components:      Result Value   Potassium 3.3 (*)    Glucose, Bld 132 (*)    BUN 50 (*)    Creatinine, Ser 2.50 (*)    GFR calc non Af Amer 25 (*)    GFR calc Af Amer 29 (*)    All other components within normal limits  CBC - Abnormal; Notable for the following components:   WBC 11.9 (*)    All other components within normal limits  GLUCOSE, CAPILLARY - Abnormal; Notable for the following components:   Glucose-Capillary 131 (*)    All other components within normal limits     IMAGES:   EKG: 05/01/2020 Rate 61 bpm  Atrial fibrillation    CV: Echo  09/07/2018 Study Conclusions   - Left ventricle: Systolic function was normal. The estimated  ejection fraction was in the range of 55% to 60%.  - Aortic valve: No evidence of vegetation. There was mild  regurgitation.  - Mitral valve: No evidence of vegetation. There was mild to  moderate regurgitation.  - Left atrium: No evidence of thrombus in the atrial cavity or  appendage.  - Right atrium: No evidence of thrombus in the atrial cavity or  appendage.  - Tricuspid valve: No evidence of vegetation. There was moderate  regurgitation.  - Pulmonic valve: No evidence of vegetation Past Medical History:  Diagnosis Date  . Abnormal EKG 11/29/2014  . Acute renal insufficiency 11/29/2014  . Angina   . Arthritis    "knees" (01/18/2014)  . Asthma   . Atrial fibrillation ([place])   . Back pain 01/25/2015  . 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 BOOP (bronchiolitis obliterans with organizing pneumonia) ([place]) 01/20/2014   OLBx 01/20/14 BOOP Started steroids 02/28/14   . CHF (congestive heart failure) ([place])   . Chronic anticoagulation-Xarelto 02/09/2014  . Chronic atrial fibrillation ([place]) 01/03/2014  . Chronic bronchitis ([place]) 12/21/2013  . Chronic diastolic heart failure ([place]) 02/22/2015  . Chronic pain of both knees 03/18/2017   Overview:  Added automatically from  request for surgery [id number]  . Chronic respiratory failure ([place]) 04/10/2015  . COPD (chronic obstructive pulmonary disease) ([place]) 01/25/2015  . Cough    coughing up blood- started last nite, seems to be worse now  . CVA (cerebral vascular accident) ([place]) 02/09/2014  . Dysrhythmia    atrial fib takes cardizem,   . Fibromyalgia   . GERD (gastroesophageal reflux disease)   . H/O hiatal hernia   . Hemoptysis 08/01/2015  . Herpes zoster 06/26/2015  . History of stroke   . Hyperkalemia-repeat pending 11/29/2014  . Hyperlipemia   . Hyperlipidemia   . Hypertension   . Hypothyroidism   . Long term (current) use of anticoagulants  03/21/2014  . Mild CAD 02/20/2015   Overview:  On recent cardiac cath  Overview:  On recent cardiac cath  . Myocardial infarction [facility])    "one dr says yes; another says no"  . Neuromuscular disorder (HCC)    rls, neuropathy  . Neuropathy    rls, neuropathy   . Normal coronary arteries 2011 11/29/2014  . Obesity (BMI 30-39.9)   . On home oxygen therapy    "2L w/CPAP at bedtime" (09/01/2018)  . OSA on CPAP    cpap & oxygen  . Pericardial effusion   . Peripheral vascular disease ([place])   . Peritoneal effusion, chronic 02/22/2015   Overview:  With surgical window  . PNA (pneumonia) 04/10/2015  . Pneumonia 5/15   "several times" (01/18/2014)  . Precordial pain 01/26/2015  . Preoperative cardiovascular examination 02/24/2016  . Primary osteoarthritis of both knees 03/18/2017   Overview:  Added automatically from request for surgery [id number]  . Restless leg syndrome   . Sleep apnea    cpap & oxygen   . Stroke [facility]) 2012   denies residual on 01/18/2014  . Troponin level elevated 11/29/2014  . Uncontrolled type 2 diabetes mellitus with diabetic polyneuropathy, with long-term current use of insulin ([place]) 12/21/2013   type ?     Past Surgical History:  Procedure Laterality Date  . ABDOMINAL AORTOGRAM W/LOWER EXTREMITY N/A 09/08/2018   Procedure: ABDOMINAL AORTOGRAM W/LOWER EXTREMITY;  Surgeon: Marty Heck, MD;  Location: [place] [facility];  Service: Cardiovascular;  Laterality: N/A;  . AMPUTATION Right 09/09/2018   Procedure: AMPUTATION RIGHT GREAT TOE;  Surgeon: Waynetta Sandy, MD;  Location: [place];  Service: Vascular;  Laterality: Right;  . APPENDECTOMY    . CARDIAC CATHETERIZATION  X 2 then 03/03/2012    NL LVF, normal coronaries, vessels are small ([facility]: Dr. Beatrix Fetters)  . CATARACT EXTRACTION W/ INTRAOCULAR LENS  IMPLANT, BILATERAL Bilateral   . CHOLECYSTECTOMY    . HERNIA REPAIR     UHR  . LAPAROSCOPIC GASTRIC BANDING  2010  . LOWER EXTREMITY ANGIOGRAPHY Left 10/11/2018    Procedure: LOWER EXTREMITY ANGIOGRAPHY;  Surgeon: Waynetta Sandy, MD;  Location: [place] [facility];  Service: Cardiovascular;  Laterality: Left;  . PERICARDIAL WINDOW Left 01/24/2014   Procedure: PERICARDIAL WINDOW;  Surgeon: Gaye Pollack, MD;  Location: [place];  Service: Thoracic;  Laterality: Left;  . PERIPHERAL VASCULAR INTERVENTION Right 09/08/2018   Procedure: PERIPHERAL VASCULAR INTERVENTION;  Surgeon: Marty Heck, MD;  Location: [place] [facility];  Service: Cardiovascular;  Laterality: Right;  . SINUS EXPLORATION  X 2  . TEE WITHOUT CARDIOVERSION N/A 09/07/2018   Procedure: TRANSESOPHAGEAL ECHOCARDIOGRAM (TEE);  Surgeon: Acie Fredrickson Wonda Cheng, MD;  Location: [place];  Service: Cardiovascular;  Laterality: N/A;  . UMBILICAL HERNIA REPAIR    .  VIDEO ASSISTED THORACOSCOPY Left 01/24/2014   Procedure: VIDEO ASSISTED THORACOSCOPY;  Surgeon: Gaye Pollack, MD;  Location: [facility];  Service: Thoracic;  Laterality: Left;  VATS/open lung biopsy  . VIDEO BRONCHOSCOPY Bilateral 12/29/2013   Procedure: VIDEO BRONCHOSCOPY WITHOUT FLUORO;  Surgeon: Elsie Stain, MD;  [facility];  Service: Endoscopy;  Laterality: Bilateral;  . VIDEO BRONCHOSCOPY N/A 01/24/2014   Procedure: VIDEO BRONCHOSCOPY;  Surgeon: Gaye Pollack, MD;  Location: [facility];  Service: Thoracic;  Laterality: N/A;    MEDICATIONS: . albuterol (PROVENTIL) (2.5 MG/3ML) 0.083% nebulizer solution  . atorvastatin (LIPITOR) 40 MG tablet  . Azelastine HCl 0.15 % SOLN  . BD PEN NEEDLE NANO U/F 32G X 4 MM MISC  . clopidogrel (PLAVIX) 75 MG tablet  . diltiazem (CARDIZEM CD) 120 MG 24 hr capsule  . docusate sodium (COLACE) 100 MG capsule  . doxycycline (VIBRA-TABS) 100 MG tablet  . ferrous sulfate 325 (65 FE) MG tablet  . Glucagon, rDNA, (GLUCAGON EMERGENCY) 1 MG KIT  . hydrALAZINE (APRESOLINE) 100 MG tablet  . insulin detemir (LEVEMIR FLEXTOUCH) 100 UNIT/ML FlexPen  . Lactulose 20 GM/30ML SOLN  . metolazone  (ZAROXOLYN) 2.5 MG tablet  . mometasone-formoterol (DULERA) 200-5 MCG/ACT AERO  . MOVANTIK 25 MG TABS tablet  . nitroGLYCERIN (NITROSTAT) 0.4 MG SL tablet  . OXYGEN  . pantoprazole (PROTONIX) 40 MG tablet  . Potassium Chloride ER 20 MEQ TBCR  . pregabalin (LYRICA) 75 MG capsule  . RELISTOR 150 MG TABS  . Semaglutide, 1 MG/DOSE, (OZEMPIC, 1 MG/DOSE,) 2 MG/1.5ML SOPN  . torsemide (DEMADEX) 20 MG tablet  . Vitamin D, Ergocalciferol, (DRISDOL) 1.25 MG (50000 UNIT) CAPS capsule  . XARELTO 15 MG TABS tablet  . XTAMPZA ER 27 MG C12A   No current facility-administered medications for this encounter.     Konrad Felix, PA-C [facility] [phone]

## 2020-05-02 NOTE — Progress Notes (Signed)
Subjective:  Patient ID: Jon [name], male    DOB: February 25, 1951  Age: 69 y.o. MRN: [id number]  Chief Complaint  Patient presents with  . surgical clearance    HPI Patient is a 69 year old white male with chronic atrial fibrillation, hypertensive heart disease, OSA with chronic respiratory failure with nocturnal hypoxia, PVD, hypothyroidism, CKD stage and poorly controlled diabetes (although improving), who presents for preoperative cardiovascular clearance for right second toe amputation secondary to diabetic vascular ulceration and osteomyelitis.  Patient is seen by Dr. March Rummage.  Surgery is planned for this Friday May 04, 2020.  Dr. March Rummage started him on doxycycline at the end of last week.  Hypertension is well controlled.  He is currently taking hydralazine 50 mg twice daily, Imdur 120 mg once daily, metoprolol 50 mg once daily and Cardizem CD 120 mg once daily.  These medicines were actually adjusted 4 to 6 weeks ago due to blood pressure going too low.  Patient has chronic kidney disease and has seen nephrology in the past.  He has stopped seeing many of her specialist due to his inability to drive long distances since his wife passed.  His creatinine has been as high as 3.75.  His creatinine yesterday was 2.5 which is elevated from his last one here which was 2.07 on March 27, 2020.  The patient has significant issues with edema of his lower extremities and weeping.  Patient is currently on torsemide 20 mg 3 tablets twice daily.  Patient takes potassium chloride 20 mEq 2 p.o. twice daily.  His potassium was slightly low on his most recent labs yesterday.  Potassium is 3.3.  Patient has atrial fibrillation is currently on Xarelto 15 mg once daily.  He also has had a stroke in the past and so is on Plavix.  I have spoken with Dr. March Rummage today and he does not routinely discontinue these medications for a toe amputation.  His diabetes is improving his sugars are anywhere from 90s in a.m. to 300s  and p.m.  He is currently on Levemir 40 units daily.  He is taking Trulicity 0.5 mg once weekly.  Patient was having severe constipation at the end of last week.  He has been on Relistor and Movantik for years due to narcotic induced constipation.  He takes chronic constipation for severe osteoarthritis as well as torn ligaments in his right knee as well as back pain.  His Movantik has not been approved.  I continued his Relistor 150 mg 3 times daily docusate 100 mg 2 daily, And I started him on lactulose 20 g twice a day.  The patient is having a normal bowel movement once daily.   Current Outpatient Medications on File Prior to Visit  Medication Sig Dispense Refill  . albuterol (PROVENTIL) (2.5 MG/3ML) 0.083% nebulizer solution Take 3 mLs (2.5 mg total) by nebulization 2 (two) times daily. And as needed (Patient taking differently: Take 2.5 mg by nebulization 2 (two) times daily as needed for wheezing. ) 75 mL 5  . atorvastatin (LIPITOR) 40 MG tablet Take 40 mg by mouth daily.    . Azelastine HCl 0.15 % SOLN Place 2 sprays into both nostrils daily.     . BD PEN NEEDLE NANO U/F 32G X 4 MM MISC     . clopidogrel (PLAVIX) 75 MG tablet Take 1 tablet (75 mg total) by mouth daily. 30 tablet 5  . diltiazem (CARDIZEM CD) 120 MG 24 hr capsule Take 120 mg by mouth daily.    Marland Kitchen  docusate sodium (COLACE) 100 MG capsule Take 100 mg by mouth 2 (two) times daily.    . doxycycline (VIBRA-TABS) 100 MG tablet Take 1 tablet (100 mg total) by mouth 2 (two) times daily. 20 tablet 0  . ferrous sulfate 325 (65 FE) MG tablet Take 325 mg by mouth daily.    . Glucagon, rDNA, (GLUCAGON EMERGENCY) 1 MG KIT Inject 1 mg as directed as needed (low blood sugar).     . hydrALAZINE (APRESOLINE) 100 MG tablet Take 50 mg by mouth 2 (two) times daily.    . insulin detemir (LEVEMIR FLEXTOUCH) 100 UNIT/ML FlexPen Inject 65 Units into the skin daily. (Patient taking differently: Inject 40 Units into the skin daily. ) 30 mL 2  .  Lactulose 20 GM/30ML SOLN Take 30 mLs (20 g total) by mouth in the morning and at bedtime. 946 mL 2  . metolazone (ZAROXOLYN) 2.5 MG tablet TAKE 1 TABLET BY MOUTH DAILY 3 HOURS PRIOR TO LASIX DAILY (Patient taking differently: Take 2.5 mg by mouth daily. 3 hours prior to torsemide) 36 tablet 3  . mometasone-formoterol (DULERA) 200-5 MCG/ACT AERO Inhale 2 puffs into the lungs 2 (two) times daily as needed for wheezing.    . nitroGLYCERIN (NITROSTAT) 0.4 MG SL tablet Place 0.4 mg under the tongue every 5 (five) minutes as needed for chest pain.     . OXYGEN Inhale into the lungs. 2 liters at bedtime    . pantoprazole (PROTONIX) 40 MG tablet Take 40 mg by mouth in the morning.     . Potassium Chloride ER 20 MEQ TBCR Take 40 mEq by mouth 2 (two) times daily.     . pregabalin (LYRICA) 75 MG capsule TAKE 1 CAPSULE BY MOUTH TWICE DAILY (Patient taking differently: Take 75 mg by mouth 2 (two) times daily. ) 180 capsule 2  . RELISTOR 150 MG TABS TAKE 3 TABLETS BY MOUTH EVERY MORNING (Patient taking differently: Take 450 mg by mouth daily. ) 90 tablet 0  . Semaglutide, 1 MG/DOSE, (OZEMPIC, 1 MG/DOSE,) 2 MG/1.5ML SOPN Inject 0.75 mLs (1 mg total) into the skin once a week. (Patient taking differently: Inject 0.5 mg into the skin every Sunday. ) 3 mL 0  . torsemide (DEMADEX) 20 MG tablet Take 3 tablets (60 mg total) by mouth 2 (two) times daily. 180 tablet 0  . Vitamin D, Ergocalciferol, (DRISDOL) 1.25 MG (50000 UNIT) CAPS capsule TAKE 1 CAPSULE BY MOUTH DAILY 90 capsule 1  . XARELTO 15 MG TABS tablet TAKE 1 TABLET BY MOUTH ONCE DAILY (Patient taking differently: Take 15 mg by mouth at bedtime. ) 90 tablet 2  . XTAMPZA ER 27 MG C12A TAKE 1 CAPSULE BY MOUTH TWICE DAILY (Patient taking differently: Take 27 mg by mouth 2 (two) times daily. ) 60 capsule 0   No current facility-administered medications on file prior to visit.   Past Medical History:  Diagnosis Date  . Abnormal EKG 11/29/2014  . Acute renal  insufficiency 11/29/2014  . Angina   . Arthritis    "knees" (01/18/2014)  . Asthma   . Atrial fibrillation (HCC)   . Back pain 01/25/2015  . 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 CHF (congestive heart failure) (HCC)   . Chronic anticoagulation-Xarelto 02/09/2014  . Chronic atrial fibrillation (HCC) 01/03/2014  . Chronic bronchitis (HCC) 12/21/2013  . Chronic diastolic heart failure (HCC) 02/22/2015  .   Chronic pain of both knees 03/18/2017   Overview:  Added automatically from request for surgery [id number]  . Chronic respiratory failure ([place]) 04/10/2015  . COPD (chronic obstructive pulmonary disease) ([place]) 01/25/2015  . Cough    coughing up blood- started last nite, seems to be worse now  . CVA (cerebral vascular accident) ([place]) 02/09/2014  . Dysrhythmia    atrial fib takes cardizem,   . Fibromyalgia   . GERD (gastroesophageal reflux disease)   . H/O hiatal hernia   . Hemoptysis 08/01/2015  . Herpes zoster 06/26/2015  . History of stroke   . Hyperkalemia-repeat pending 11/29/2014  . Hyperlipemia   . Hyperlipidemia   . Hypertension   . Hypothyroidism   . Long term (current) use of anticoagulants 03/21/2014  . Mild CAD 02/20/2015   Overview:  On recent cardiac cath  Overview:  On recent cardiac cath  . Myocardial infarction [facility])    "one dr says yes; another says no"  . Neuromuscular disorder (HCC)    rls, neuropathy  . Neuropathy    rls, neuropathy   . Normal coronary arteries 2011 11/29/2014  . Obesity (BMI 30-39.9)   . On home oxygen therapy    "2L w/CPAP at bedtime" (09/01/2018)  . OSA on CPAP    cpap & oxygen  . Pericardial effusion   . Peripheral vascular disease ([place])   . Peritoneal effusion, chronic 02/22/2015   Overview:  With surgical window  . PNA (pneumonia) 04/10/2015  . Pneumonia 5/15   "several times" (01/18/2014)  . Precordial pain 01/26/2015  .  Preoperative cardiovascular examination 02/24/2016  . Primary osteoarthritis of both knees 03/18/2017   Overview:  Added automatically from request for surgery [id number]  . Restless leg syndrome   . Sleep apnea    cpap & oxygen   . Stroke [facility]) 2012   denies residual on 01/18/2014  . Troponin level elevated 11/29/2014  . Uncontrolled type 2 diabetes mellitus with diabetic polyneuropathy, with long-term current use of insulin ([place]) 12/21/2013   type ?    Past Surgical History:  Procedure Laterality Date  . ABDOMINAL AORTOGRAM W/LOWER EXTREMITY N/A 09/08/2018   Procedure: ABDOMINAL AORTOGRAM W/LOWER EXTREMITY;  Surgeon: Marty Heck, MD;  Location: [place] CV LAB;  Service: Cardiovascular;  Laterality: N/A;  . AMPUTATION Right 09/09/2018   Procedure: AMPUTATION RIGHT GREAT TOE;  Surgeon: Waynetta Sandy, MD;  Location: [place];  Service: Vascular;  Laterality: Right;  . APPENDECTOMY    . CARDIAC CATHETERIZATION  X 2 then 03/03/2012    NL LVF, normal coronaries, vessels are small ([facility]: Dr. Beatrix Fetters)  . CATARACT EXTRACTION W/ INTRAOCULAR LENS  IMPLANT, BILATERAL Bilateral   . CHOLECYSTECTOMY    . HERNIA REPAIR     UHR  . LAPAROSCOPIC GASTRIC BANDING  2010  . LOWER EXTREMITY ANGIOGRAPHY Left 10/11/2018   Procedure: LOWER EXTREMITY ANGIOGRAPHY;  Surgeon: Waynetta Sandy, MD;  Location: [place] CV LAB;  Service: Cardiovascular;  Laterality: Left;  . PERICARDIAL WINDOW Left 01/24/2014   Procedure: PERICARDIAL WINDOW;  Surgeon: Gaye Pollack, MD;  Location: [place];  Service: Thoracic;  Laterality: Left;  . PERIPHERAL VASCULAR INTERVENTION Right 09/08/2018   Procedure: PERIPHERAL VASCULAR INTERVENTION;  Surgeon: Marty Heck, MD;  Location: [place] CV LAB;  Service: Cardiovascular;  Laterality: Right;  . SINUS EXPLORATION  X 2  . TEE WITHOUT CARDIOVERSION N/A 09/07/2018   Procedure: TRANSESOPHAGEAL ECHOCARDIOGRAM (TEE);  Surgeon: Thayer Headings, MD;  Location: [facility]  ENDOSCOPY;  Service: Cardiovascular;  Laterality: N/A;  . UMBILICAL HERNIA REPAIR    . VIDEO ASSISTED THORACOSCOPY Left 01/24/2014   Procedure: VIDEO ASSISTED THORACOSCOPY;  Surgeon: Gaye Pollack, MD;  Location: [facility] OR;  Service: Thoracic;  Laterality: Left;  VATS/open lung biopsy  . VIDEO BRONCHOSCOPY Bilateral 12/29/2013   Procedure: VIDEO BRONCHOSCOPY WITHOUT FLUORO;  Surgeon: Elsie Stain, MD;  Location: [facility] ENDOSCOPY;  Service: Endoscopy;  Laterality: Bilateral;  . VIDEO BRONCHOSCOPY N/A 01/24/2014   Procedure: VIDEO BRONCHOSCOPY;  Surgeon: Gaye Pollack, MD;  Location: [facility] OR;  Service: Thoracic;  Laterality: N/A;    Family History  Problem Relation Age of Onset  . Cancer Mother   . Stroke Father   . Heart disease Father   . Seizures Sister   . Diabetes Sister   . Anesthesia problems Neg Hx   . Hypotension Neg Hx   . Malignant hyperthermia Neg Hx   . Pseudochol deficiency Neg Hx    Social History   Socioeconomic History  . Marital status: Widowed    Spouse name: Not on file  . Number of children: 3  . Years of education: Not on file  . Highest education level: Not on file  Occupational History  . Not on file  Tobacco Use  . Smoking status: Former Smoker    Types: Cigars    Quit date: 09/01/2006    Years since quitting: 13.6  . Smokeless tobacco: Never Used  Vaping Use  . Vaping Use: Never used  Substance and Sexual Activity  . 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 Drug use: No  . Sexual activity: Never  Other Topics Concern  . Not on file  Social History Narrative  . Not on file   Social Determinants of Health   Financial Resource Strain:   . Difficulty of Paying Living Expenses: Not on file  Food Insecurity:   . Worried About Charity fundraiser in the Last Year: Not on file  . Ran Out of Food in the Last Year: Not on file  Transportation Needs: No Transportation Needs  . Lack of Transportation (Medical): No  . Lack  of Transportation (Non-Medical): No  Physical Activity: Inactive  . Days of Exercise per Week: 0 days  . Minutes of Exercise per Session: 0 min  Stress:   . Feeling of Stress : Not on file  Social Connections:   . Frequency of Communication with Friends and Family: Not on file  . Frequency of Social Gatherings with Friends and Family: Not on file  . Attends Religious Services: Not on file  . Active Member of Clubs or Organizations: Not on file  . Attends Archivist Meetings: Not on file  . Marital Status: Not on file    Review of Systems  Constitutional: Negative for chills and fever.  HENT: Negative for congestion, ear pain and sore throat.   Respiratory: Positive for shortness of breath (With exertion.). Negative for cough.   Cardiovascular: Negative for chest pain.  Gastrointestinal: Negative for abdominal pain, constipation, nausea and vomiting.     Objective:  BP 118/60   Pulse 100   Temp 98.7 F (37.1 C)   Resp 18   Ht 5' 7" (1.702 m)   Wt 251 lb 6.4 oz (114 kg)   BMI 39.37 kg/m   BP/Weight 05/02/2020 05/01/2020 5/45/6256  Systolic BP 389 373 428  Diastolic BP 60 76 60  Wt. (Lbs)  251.4 - 250  BMI 39.37 39.16 39.16    Physical Exam Vitals reviewed.  Constitutional:      Appearance: Normal appearance. He is obese.  HENT:     Right Ear: Tympanic membrane normal.     Left Ear: Tympanic membrane normal.  Neck:     Vascular: No carotid bruit.  Cardiovascular:     Rate and Rhythm: Normal rate. Rhythm irregular.     Heart sounds: Normal heart sounds.  Pulmonary:     Effort: Pulmonary effort is normal.     Breath sounds: Normal breath sounds. No wheezing, rhonchi or rales.  Abdominal:     General: Bowel sounds are normal.     Palpations: Abdomen is soft.     Tenderness: There is no abdominal tenderness.  Skin:    Comments: I did not examine his feet today. Bandaged   Neurological:     Mental Status: He is alert and oriented to person, place, and  time.  Psychiatric:        Mood and Affect: Mood normal.        Behavior: Behavior normal.     Lab Results  Component Value Date   WBC 11.9 (H) 05/01/2020   HGB 13.4 05/01/2020   HCT 40.5 05/01/2020   PLT 309 05/01/2020   GLUCOSE 132 (H) 05/01/2020   CHOL 117 12/13/2019   TRIG 124 12/13/2019   HDL 40 12/13/2019   LDLCALC 55 12/13/2019   ALT 9 03/27/2020   AST 11 03/27/2020   NA 143 05/01/2020   K 3.3 (L) 05/01/2020   CL 101 05/01/2020   CREATININE 2.50 (H) 05/01/2020   BUN 50 (H) 05/01/2020   CO2 29 05/01/2020   TSH 1.560 03/27/2020   INR 1.68 09/01/2018   HGBA1C 8.8 (H) 03/27/2020      Assessment & Plan:   1. Preoperative clearance Patient is cleared for surgery.  Not a significantly invasive surgery however patient has numerous medical issues which unfortunately may lead to complications.  We will continue blood thinners as requested by Dr. Price.  2. Need for immunization against influenza - Flu Vaccine MDCK QUAD PF  3. CKD stage 4 due to type 2 diabetes mellitus (HCC) Chronic kidney disease has worsened.  Recommend hold Zaroxolyn Thursday and Friday morning.  This will also help with his low potassium.  4. Hypertensive heart disease with heart failure (HCC) Blood pressure is well controlled on current medications.  5. PVD (peripheral vascular disease) (HCC) Certainly this is contributed to his ulcers.  Will defer any further treatment to Dr. Price or his vascular doctors.  6. Type 2 diabetes mellitus with diabetic neuropathy, with long-term current use of insulin (HCC) Improved but not yet at goal.  Continue Ozempic 1 mg once weekly,  7.  Hypokalemia Recommend take an extra potassium chloride 20 mEq a day above his regular dose Until this coming Monday.  8.  Diabetic and arterial ulcer on right second toe-cleared for surgery.  Orders Placed This Encounter  Procedures  . Flu Vaccine MDCK QUAD PF     Follow-up: No follow-ups on file.  An After Visit  Summary was printed and given to the patient.     Family Practice [phone] 

## 2020-05-02 NOTE — Telephone Encounter (Signed)
DOS 05/04/2020   AMPUTATION TOE MPJ JOINT RT- [id number]  [facility] MEDICARE EFFECTIVE DATE - 09/02/2019  PLAN DEDUCTIBLE - $0.00 OUT OF POCKET - $3600.00 W/$2483.06 REMAINING   CO-INSURANCE 0% / Day OUTPATIENT SURGERY 0% / [place] $295 / Day OUTPATIENT SURGERY $295 / [place]  Notification or Prior Authorization is not required for the requested services  Decision ID #:[id number]

## 2020-05-03 ENCOUNTER — Ambulatory Visit: Payer: Medicare Other | Admitting: Cardiology

## 2020-05-03 ENCOUNTER — Ambulatory Visit: Payer: Medicare Other | Admitting: Podiatry

## 2020-05-03 NOTE — Progress Notes (Signed)
  Subjective:  Patient ID: Jon [name], male    DOB: 1950-11-27,  MRN: [id number]  Chief Complaint  Patient presents with  . Foot Ulcer    Rt 2nd toe ulcer incease of redness, swleling, macerated, bone sticking out and draiange x 3 wks ago; 7-9/10 occasional pains - no N/V/F/Ch Tx: medihoney  dressing daily and sx shoe   . Diabetes    FBS: 126 A1C: unknown     69 y.o. male presents for wound care. Hx confirmed with patient.  Objective:  Physical Exam: Wound Location: Right 2nd toe Wound Measurement: 1.3x1.5 Wound Base: Mixed Granular/Fibrotic with exposed gray non-viable bone Peri-wound: Reddened Exudate: None: wound tissue dry wound without warmth, erythema, signs of acute infection  Assessment:   1. Diabetic ulcer of toe of right foot associated with type 2 diabetes mellitus, limited to breakdown of skin ([place])   2. Cellulitis and abscess of foot, except toes   3. Acute osteomyelitis of toe, right ([place])     Plan:  Patient was evaluated and treated and all questions answered.  Ulcer Right 2nd toe -Right 2nd toe wound has worsened, now with exposed bone noted. -Rx doxycycline for cellulitis -Patient has failed all conservative therapy and wishes to proceed with surgical intervention. All risks, benefits, and alternatives discussed with patient. No guarantees given. Consent reviewed and signed by patient. -Planned procedures: right 2nd toe amputation   No follow-ups on file.

## 2020-05-03 NOTE — H&P (View-Only) (Signed)
  Subjective:  Patient ID: Jon [name], male    DOB: 1951-01-14,  MRN: [id number]  Chief Complaint  Patient presents with  . Foot Ulcer    Rt 2nd toe ulcer incease of redness, swleling, macerated, bone sticking out and draiange x 3 wks ago; 7-9/10 occasional pains - no N/V/F/Ch Tx: medihoney  dressing daily and sx shoe   . Diabetes    FBS: 126 A1C: unknown     69 y.o. male presents for wound care. Hx confirmed with patient.  Objective:  Physical Exam: Wound Location: Right 2nd toe Wound Measurement: 1.3x1.5 Wound Base: Mixed Granular/Fibrotic with exposed gray non-viable bone Peri-wound: Reddened Exudate: None: wound tissue dry wound without warmth, erythema, signs of acute infection  Assessment:   1. Diabetic ulcer of toe of right foot associated with type 2 diabetes mellitus, limited to breakdown of skin ([place])   2. Cellulitis and abscess of foot, except toes   3. Acute osteomyelitis of toe, right ([place])     Plan:  Patient was evaluated and treated and all questions answered.  Ulcer Right 2nd toe -Right 2nd toe wound has worsened, now with exposed bone noted. -Rx doxycycline for cellulitis -Patient has failed all conservative therapy and wishes to proceed with surgical intervention. All risks, benefits, and alternatives discussed with patient. No guarantees given. Consent reviewed and signed by patient. -Planned procedures: right 2nd toe amputation   No follow-ups on file.

## 2020-05-04 ENCOUNTER — Ambulatory Visit (HOSPITAL_COMMUNITY): Payer: Medicare Other | Admitting: Physician Assistant

## 2020-05-04 ENCOUNTER — Ambulatory Visit (HOSPITAL_COMMUNITY): Payer: Medicare Other

## 2020-05-04 ENCOUNTER — Other Ambulatory Visit: Payer: Self-pay

## 2020-05-04 ENCOUNTER — Other Ambulatory Visit: Payer: Self-pay | Admitting: Podiatry

## 2020-05-04 ENCOUNTER — Ambulatory Visit (HOSPITAL_COMMUNITY): Payer: Medicare Other | Admitting: Certified Registered Nurse Anesthetist

## 2020-05-04 ENCOUNTER — Ambulatory Visit (HOSPITAL_COMMUNITY)
Admission: RE | Admit: 2020-05-04 | Discharge: 2020-05-04 | Disposition: A | Discharge status: Home / Self Care | Payer: Medicare Other | Attending: Podiatry | Admitting: Podiatry

## 2020-05-04 ENCOUNTER — Encounter (HOSPITAL_COMMUNITY)
Admission: RE | Disposition: A | Discharge status: Home / Self Care | Payer: Self-pay | Source: Home / Self Care | Attending: Podiatry

## 2020-05-04 ENCOUNTER — Encounter (HOSPITAL_COMMUNITY): Payer: Self-pay | Admitting: Podiatry

## 2020-05-04 DIAGNOSIS — L03115 Cellulitis of right lower limb: Secondary | ICD-10-CM | POA: Diagnosis not present

## 2020-05-04 DIAGNOSIS — S98131A Complete traumatic amputation of one right lesser toe, initial encounter: Secondary | ICD-10-CM | POA: Diagnosis not present

## 2020-05-04 DIAGNOSIS — L97519 Non-pressure chronic ulcer of other part of right foot with unspecified severity: Secondary | ICD-10-CM | POA: Diagnosis not present

## 2020-05-04 DIAGNOSIS — M7989 Other specified soft tissue disorders: Secondary | ICD-10-CM | POA: Diagnosis not present

## 2020-05-04 DIAGNOSIS — E11621 Type 2 diabetes mellitus with foot ulcer: Secondary | ICD-10-CM | POA: Insufficient documentation

## 2020-05-04 DIAGNOSIS — L97514 Non-pressure chronic ulcer of other part of right foot with necrosis of bone: Secondary | ICD-10-CM | POA: Diagnosis not present

## 2020-05-04 DIAGNOSIS — Z794 Long term (current) use of insulin: Secondary | ICD-10-CM | POA: Diagnosis not present

## 2020-05-04 DIAGNOSIS — L97511 Non-pressure chronic ulcer of other part of right foot limited to breakdown of skin: Secondary | ICD-10-CM

## 2020-05-04 DIAGNOSIS — Z89421 Acquired absence of other right toe(s): Secondary | ICD-10-CM | POA: Diagnosis not present

## 2020-05-04 DIAGNOSIS — Z9889 Other specified postprocedural states: Secondary | ICD-10-CM

## 2020-05-04 DIAGNOSIS — S98911A Complete traumatic amputation of right foot, level unspecified, initial encounter: Secondary | ICD-10-CM | POA: Diagnosis not present

## 2020-05-04 DIAGNOSIS — M86171 Other acute osteomyelitis, right ankle and foot: Secondary | ICD-10-CM | POA: Diagnosis not present

## 2020-05-04 HISTORY — PX: AMPUTATION TOE: SHX6595

## 2020-05-04 LAB — GLUCOSE, CAPILLARY
Glucose-Capillary: 110 mg/dL — ABNORMAL HIGH (ref 70–99)
Glucose-Capillary: 123 mg/dL — ABNORMAL HIGH (ref 70–99)

## 2020-05-04 SURGERY — AMPUTATION, TOE
Anesthesia: Monitor Anesthesia Care | Site: Toe | Laterality: Right

## 2020-05-04 MED ORDER — FENTANYL CITRATE (PF) 100 MCG/2ML IJ SOLN: 25.0000 ug | INTRAMUSCULAR | Status: DC | PRN

## 2020-05-04 MED ORDER — PROPOFOL 10 MG/ML IV BOLUS
INTRAVENOUS | Status: DC | PRN
  Administered 2020-05-04 (×2): 20 mg via INTRAVENOUS

## 2020-05-04 MED ORDER — EPHEDRINE 5 MG/ML INJ
INTRAVENOUS | Status: AC
  Filled 2020-05-04: qty 10

## 2020-05-04 MED ORDER — LACTATED RINGERS IV SOLN: INTRAVENOUS | Status: DC

## 2020-05-04 MED ORDER — CLINDAMYCIN PHOSPHATE 900 MG/50ML IV SOLN
900.0000 mg | INTRAVENOUS | Status: AC
  Administered 2020-05-04: 900 mg via INTRAVENOUS
  Filled 2020-05-04: qty 50

## 2020-05-04 MED ORDER — BUPIVACAINE HCL (PF) 0.5 % IJ SOLN
INTRAMUSCULAR | Status: DC | PRN
  Administered 2020-05-04: 10 mL

## 2020-05-04 MED ORDER — PROPOFOL 500 MG/50ML IV EMUL
INTRAVENOUS | Status: DC | PRN
  Administered 2020-05-04: 75 ug/kg/min via INTRAVENOUS

## 2020-05-04 MED ORDER — LIDOCAINE 2% (20 MG/ML) 5 ML SYRINGE
INTRAMUSCULAR | Status: AC
  Filled 2020-05-04: qty 5

## 2020-05-04 MED ORDER — SODIUM CHLORIDE 0.9 % IR SOLN
Status: DC | PRN
  Administered 2020-05-04: 1000 mL

## 2020-05-04 MED ORDER — BUPIVACAINE HCL (PF) 0.5 % IJ SOLN
INTRAMUSCULAR | Status: AC
  Filled 2020-05-04: qty 30

## 2020-05-04 MED ORDER — LIDOCAINE 2% (20 MG/ML) 5 ML SYRINGE
INTRAMUSCULAR | Status: DC | PRN
  Administered 2020-05-04: 50 mg via INTRAVENOUS

## 2020-05-04 MED ORDER — PHENYLEPHRINE 40 MCG/ML (10ML) SYRINGE FOR IV PUSH (FOR BLOOD PRESSURE SUPPORT)
PREFILLED_SYRINGE | INTRAVENOUS | Status: AC
  Filled 2020-05-04: qty 10

## 2020-05-04 MED ORDER — ORAL CARE MOUTH RINSE: 15.0000 mL | Freq: Once | OROMUCOSAL | Status: AC

## 2020-05-04 MED ORDER — CHLORHEXIDINE GLUCONATE 0.12 % MT SOLN
15.0000 mL | Freq: Once | OROMUCOSAL | Status: AC
  Administered 2020-05-04: 15 mL via OROMUCOSAL

## 2020-05-04 MED ORDER — OXYCODONE-ACETAMINOPHEN 5-325 MG PO TABS: 1.0000 | ORAL_TABLET | ORAL | 0 refills | Status: DC | PRN

## 2020-05-04 MED ORDER — CLINDAMYCIN HCL 300 MG PO CAPS: 300.0000 mg | ORAL_CAPSULE | Freq: Two times a day (BID) | ORAL | 0 refills | Status: DC

## 2020-05-04 SURGICAL SUPPLY — 40 items
BLADE SURG 15 STRL LF DISP TIS (BLADE) ×1 IMPLANT
BLADE SURG 15 STRL SS (BLADE) ×2
BNDG ELASTIC 4X5.8 VLCR STR LF (GAUZE/BANDAGES/DRESSINGS) ×2 IMPLANT
BNDG ELASTIC 6X5.8 VLCR STR LF (GAUZE/BANDAGES/DRESSINGS) ×2 IMPLANT
BNDG GAUZE ELAST 4 BULKY (GAUZE/BANDAGES/DRESSINGS) ×1 IMPLANT
CLEANER TIP ELECTROSURG 2X2 (MISCELLANEOUS) ×2 IMPLANT
CNTNR URN SCR LID CUP LEK RST (MISCELLANEOUS) ×1 IMPLANT
CONT SPEC 4OZ STRL OR WHT (MISCELLANEOUS) ×2
COVER MAYO STAND STRL (DRAPES) ×2 IMPLANT
COVER SURGICAL LIGHT HANDLE (MISCELLANEOUS) ×2 IMPLANT
COVER WAND RF STERILE (DRAPES) IMPLANT
CUFF TOURN SGL QUICK 24 (TOURNIQUET CUFF) ×2
CUFF TRNQT CYL 24X4X16.5-23 (TOURNIQUET CUFF) ×1 IMPLANT
DECANTER SPIKE VIAL GLASS SM (MISCELLANEOUS) ×2 IMPLANT
DRSG TELFA 3X8 NADH (GAUZE/BANDAGES/DRESSINGS) ×2 IMPLANT
GAUZE SPONGE 4X4 12PLY STRL (GAUZE/BANDAGES/DRESSINGS) ×4 IMPLANT
GLOVE BIO SURGEON STRL SZ7.5 (GLOVE) ×2 IMPLANT
GLOVE BIOGEL PI IND STRL 8 (GLOVE) ×1 IMPLANT
GLOVE BIOGEL PI INDICATOR 8 (GLOVE) ×1
GOWN STRL REUS W/TWL XL LVL3 (GOWN DISPOSABLE) ×2 IMPLANT
KIT BASIN OR (CUSTOM PROCEDURE TRAY) ×2 IMPLANT
KIT TURNOVER KIT A (KITS) IMPLANT
MARKER SKIN DUAL TIP RULER LAB (MISCELLANEOUS) ×1 IMPLANT
NDL HYPO 25X1 1.5 SAFETY (NEEDLE) ×1 IMPLANT
NEEDLE HYPO 25X1 1.5 SAFETY (NEEDLE) ×2 IMPLANT
PACK ORTHO EXTREMITY (CUSTOM PROCEDURE TRAY) ×2 IMPLANT
PAD DRESSING TELFA 3X8 NADH (GAUZE/BANDAGES/DRESSINGS) IMPLANT
PADDING UNDERCAST 2 STRL (CAST SUPPLIES) ×1
PADDING UNDERCAST 2X4 STRL (CAST SUPPLIES) ×1 IMPLANT
PENCIL SMOKE EVACUATOR (MISCELLANEOUS) ×1 IMPLANT
SUT ETHILON 4 0 PS 2 18 (SUTURE) ×1 IMPLANT
SUT PROLENE 4 0 PS 2 18 (SUTURE) ×2 IMPLANT
SUT VIC AB 1 CT1 27 (SUTURE) ×2
SUT VIC AB 1 CT1 27XBRD ANTBC (SUTURE) ×1 IMPLANT
SUT VIC AB 4-0 PS2 27 (SUTURE) ×1 IMPLANT
SYR 20ML LL LF (SYRINGE) ×1 IMPLANT
TOWEL OR 17X26 10 PK STRL BLUE (TOWEL DISPOSABLE) ×2 IMPLANT
TOWEL OR NON WOVEN STRL DISP B (DISPOSABLE) ×2 IMPLANT
TRAY PREP A LATEX SAFE STRL (SET/KITS/TRAYS/PACK) ×2 IMPLANT
UNDERPAD 30X36 HEAVY ABSORB (UNDERPADS AND DIAPERS) ×6 IMPLANT

## 2020-05-04 NOTE — Telephone Encounter (Signed)
Please Advise

## 2020-05-04 NOTE — Anesthesia Preprocedure Evaluation (Addendum)
Anesthesia Evaluation  Patient identified by MRN, date of birth, ID band Patient awake    Reviewed: Allergy & Precautions, NPO status , Patient's Chart, lab work & pertinent test results  Airway Mallampati: II  TM Distance: >3 FB Neck ROM: Full    Dental  (+) Edentulous Upper, Edentulous Lower   Pulmonary asthma , sleep apnea, Continuous Positive Airway Pressure Ventilation and Oxygen sleep apnea , COPD (2L qhs),  COPD inhaler and oxygen dependent, former smoker,    Pulmonary exam normal breath sounds clear to auscultation       Cardiovascular hypertension, + angina + CAD, + Past MI, + Peripheral Vascular Disease and +CHF  Normal cardiovascular exam+ dysrhythmias Atrial Fibrillation  Rhythm:Regular Rate:Normal  TEE 2020 - Left ventricle: Systolic function was normal. The estimated ejection fraction was in the range of 55% to 60%.  - Aortic valve: No evidence of vegetation. There was mild  regurgitation.  - Mitral valve: No evidence of vegetation. There was mild tomoderate regurgitation.  - Left atrium: No evidence of thrombus in the atrial cavity or appendage.  - Right atrium: No evidence of thrombus in the atrial cavity or appendage.  - Tricuspid valve: No evidence of vegetation. There was moderate regurgitation.  - Pulmonic valve: No evidence of vegetation   Neuro/Psych PSYCHIATRIC DISORDERS Depression CVA, No Residual Symptoms    GI/Hepatic Neg liver ROS, hiatal hernia, GERD  ,  Endo/Other  diabetes, Insulin DependentHypothyroidism Morbid obesity (BMI 39)  Renal/GU Renal InsufficiencyRenal disease (Cr 2.5 (baseline ~2), K 3.3)  negative genitourinary   Musculoskeletal  (+) Arthritis , Fibromyalgia -  Abdominal   Peds  Hematology  (+) Blood dyscrasia (on xarelto), ,   Anesthesia Other Findings   Reproductive/Obstetrics                            Anesthesia Physical Anesthesia Plan  ASA:  III  Anesthesia Plan: MAC   Post-op Pain Management:    Induction: Intravenous  PONV Risk Score and Plan: 1 and Propofol infusion and Treatment may vary due to age or medical condition  Airway Management Planned: Natural Airway  Additional Equipment:   Intra-op Plan:   Post-operative Plan:   Informed Consent: I have reviewed the patients History and Physical, chart, labs and discussed the procedure including the risks, benefits and alternatives for the proposed anesthesia with the patient or authorized representative who has indicated his/her understanding and acceptance.     Dental advisory given  Plan Discussed with: CRNA  Anesthesia Plan Comments:         Anesthesia Quick Evaluation

## 2020-05-04 NOTE — Discharge Instructions (Signed)
  After Surgery Instructions   1) If you are recuperating from surgery anywhere other than home, please be sure to leave us the number where you can be reached.  2) Go directly home and rest.  3) Keep the operated foot(feet) elevated six inches above the hip when sitting or lying down. This will help control swelling and pain.  4) Support the elevated foot and leg with pillows. DO NOT PLACE PILLOWS UNDER THE KNEE.  5) DO NOT REMOVE or get your bandages WET, unless you were given different instructions by your doctor to do so. This increases the risk of infection.  6) Wear your surgical shoe or surgical boot at all times when you are up on your feet.  7) A limited amount of pain and swelling may occur. The skin may take on a bruised appearance. DO NOT BE ALARMED, THIS IS NORMAL.  8) For slight pain and swelling, apply an ice pack directly over the bandages for 15 minutes only out of each hour of the day. Continue until seen in the office for your first post op visit. DO NOT APPLY ANY FORM OF HEAT TO THE AREA.  9) Have prescriptions filled immediately and take as directed.  10) Drink lots of liquids, water and juice to stay hydrated.  11) CALL IMMEDIATELY IF:  *Bleeding continues until the following day of surgery  *Pain increases and/or does not respond to medication  *Bandages or cast appears to tight  *If your bandage gets wet  *Trip, fall or stump your surgical foot  *If your temperature goes above 101  *If you have ANY questions at all  12) You are expected to be weightbearing after your surgery.   If you need to reach the nurse for any reason, please call: [place]: [phone] [place]: [phone] : [phone]  

## 2020-05-04 NOTE — Transfer of Care (Signed)
Immediate Anesthesia Transfer of Care Note  Patient: Jon [name]  Procedure(s) Performed: AMPUTATION TOE RIGHT 2nd TOE (Right Toe)  Patient Location: PACU  Anesthesia Type:MAC  Level of Consciousness: awake, oriented, patient cooperative and responds to stimulation  Airway & Oxygen Therapy: Patient Spontanous Breathing and Patient connected to face mask oxygen  Post-op Assessment: Report given to RN and Post -op Vital signs reviewed and stable  Post vital signs: Reviewed and stable  Last Vitals:  Vitals Value Taken Time  BP 136/67 05/04/20 1401  Temp 36.6 C 05/04/20 1401  Pulse 53 05/04/20 1401  Resp 14 05/04/20 1401  SpO2 100 % 05/04/20 1401    Last Pain:  Vitals:   05/04/20 1253  TempSrc:   PainSc: 0-No pain         Complications: No complications documented.

## 2020-05-04 NOTE — Anesthesia Postprocedure Evaluation (Signed)
Anesthesia Post Note  Patient: Jon [name]  Procedure(s) Performed: AMPUTATION TOE RIGHT 2nd TOE (Right Toe)     Patient location during evaluation: PACU Anesthesia Type: MAC Level of consciousness: awake and alert Pain management: pain level controlled Vital Signs Assessment: post-procedure vital signs reviewed and stable Respiratory status: spontaneous breathing, nonlabored ventilation, respiratory function stable and patient connected to nasal cannula oxygen Cardiovascular status: stable and blood pressure returned to baseline Postop Assessment: no apparent nausea or vomiting Anesthetic complications: no   No complications documented.  Last Vitals:  Vitals:   05/04/20 1445 05/04/20 1515  BP: (!) 156/68 (!) 159/83  Pulse:    Resp: 18 20  Temp: 36.6 C 36.6 C  SpO2: 98% 98%    Last Pain:  Vitals:   05/04/20 1515  TempSrc:   PainSc: 0-No pain                 Leisel Pinette L Joash Tony

## 2020-05-04 NOTE — Interval H&P Note (Signed)
History and Physical Interval Note:  05/04/2020 12:39 PM  TRITON HEIDRICH  has presented today for surgery, with the diagnosis of Osteomyelitis, Ulcer with necrosis of bone.  The various methods of treatment have been discussed with the patient and family. After consideration of risks, benefits and other options for treatment, the patient has consented to  Procedure(s): AMPUTATION TOE RIGHT 2nd TOE (Right) as a surgical intervention.  The patient's history has been reviewed, patient examined, no change in status, stable for surgery.  I have reviewed the patient's chart and labs.  Questions were answered to the patient's satisfaction.     Evelina Bucy

## 2020-05-04 NOTE — Op Note (Signed)
  Patient Name: Jon [name] DOB: 07-04-1951  MRN: [id number]   Date of Surgery: 05/04/2020  Surgeon: Dr. Hardie Pulley, DPM Assistants: none  Pre-operative Diagnosis:  Diabetic ulcer necrosis of bone Post-operative Diagnosis:  same Procedures:  1) Amputation right 2nd toe MPJ level Pathology/Specimens: ID Type Source Tests Collected by Time Destination  1 : second toe on the right foot Tissue PATH Digit amputation SURGICAL PATHOLOGY Evelina Bucy, DPM 05/04/2020 1333    Anesthesia: MAC/local Hemostasis: * No tourniquets in log * Estimated Blood Loss: 10 mL Materials: * No implants in log * Medications: none Complications: none  Indications for Procedure:  This is a 69 y.o. male with a necrotic toe wound for which amputation was indicated. All risks, benefits, and alternatives where discussed. No guarantees given   Procedure in Detail: Patient was identified in pre-operative holding area. Formal consent was signed and the right lower extremity was marked. Patient was brought back to the operating room. Anesthesia was induced. The extremity was prepped and draped in the usual sterile fashion. Timeout was taken to confirm patient name, laterality, and procedure prior to incision.   Attention was then directed to the right second toe where an incision was made at the MPJ. Dissection was carried down to level of bone.  Dissection was continued to the metatarsophalangeal joint and all collateral ligaments were freed at the joint.  The bone soft tissue attachments of the proximal phalanx were removed and passed for pathology.  The remaining metatarsal head appeared healthy and viable.  The area was copiously irrigated.  The skin was reapproximated with 4-0 vicryl and 4-0 nylon. The foot was then dressed with telfa, 4x4, kerlix, ACE bandage. Patient tolerated the procedure well.  Disposition: Following a period of post-operative monitoring, patient will be transferred home.

## 2020-05-05 ENCOUNTER — Encounter (HOSPITAL_COMMUNITY): Payer: Self-pay | Admitting: Podiatry

## 2020-05-05 ENCOUNTER — Other Ambulatory Visit: Payer: Self-pay | Admitting: Podiatry

## 2020-05-08 ENCOUNTER — Telehealth: Payer: Self-pay

## 2020-05-08 DIAGNOSIS — I130 Hypertensive heart and chronic kidney disease with heart failure and stage 1 through stage 4 chronic kidney disease, or unspecified chronic kidney disease: Secondary | ICD-10-CM | POA: Diagnosis not present

## 2020-05-08 DIAGNOSIS — I482 Chronic atrial fibrillation, unspecified: Secondary | ICD-10-CM | POA: Diagnosis not present

## 2020-05-08 DIAGNOSIS — Z9181 History of falling: Secondary | ICD-10-CM | POA: Diagnosis not present

## 2020-05-08 DIAGNOSIS — I824Z2 Acute embolism and thrombosis of unspecified deep veins of left distal lower extremity: Secondary | ICD-10-CM | POA: Diagnosis not present

## 2020-05-08 DIAGNOSIS — E1151 Type 2 diabetes mellitus with diabetic peripheral angiopathy without gangrene: Secondary | ICD-10-CM | POA: Diagnosis not present

## 2020-05-08 DIAGNOSIS — I25119 Atherosclerotic heart disease of native coronary artery with unspecified angina pectoris: Secondary | ICD-10-CM | POA: Diagnosis not present

## 2020-05-08 DIAGNOSIS — E1122 Type 2 diabetes mellitus with diabetic chronic kidney disease: Secondary | ICD-10-CM | POA: Diagnosis not present

## 2020-05-08 DIAGNOSIS — J9620 Acute and chronic respiratory failure, unspecified whether with hypoxia or hypercapnia: Secondary | ICD-10-CM | POA: Diagnosis not present

## 2020-05-08 DIAGNOSIS — K219 Gastro-esophageal reflux disease without esophagitis: Secondary | ICD-10-CM | POA: Diagnosis not present

## 2020-05-08 DIAGNOSIS — Z9981 Dependence on supplemental oxygen: Secondary | ICD-10-CM | POA: Diagnosis not present

## 2020-05-08 DIAGNOSIS — Z8673 Personal history of transient ischemic attack (TIA), and cerebral infarction without residual deficits: Secondary | ICD-10-CM | POA: Diagnosis not present

## 2020-05-08 DIAGNOSIS — E039 Hypothyroidism, unspecified: Secondary | ICD-10-CM | POA: Diagnosis not present

## 2020-05-08 DIAGNOSIS — L97811 Non-pressure chronic ulcer of other part of right lower leg limited to breakdown of skin: Secondary | ICD-10-CM | POA: Diagnosis not present

## 2020-05-08 DIAGNOSIS — I252 Old myocardial infarction: Secondary | ICD-10-CM | POA: Diagnosis not present

## 2020-05-08 DIAGNOSIS — G2581 Restless legs syndrome: Secondary | ICD-10-CM | POA: Diagnosis not present

## 2020-05-08 DIAGNOSIS — E1142 Type 2 diabetes mellitus with diabetic polyneuropathy: Secondary | ICD-10-CM | POA: Diagnosis not present

## 2020-05-08 DIAGNOSIS — G4733 Obstructive sleep apnea (adult) (pediatric): Secondary | ICD-10-CM | POA: Diagnosis not present

## 2020-05-08 DIAGNOSIS — M797 Fibromyalgia: Secondary | ICD-10-CM | POA: Diagnosis not present

## 2020-05-08 DIAGNOSIS — N183 Chronic kidney disease, stage 3 unspecified: Secondary | ICD-10-CM | POA: Diagnosis not present

## 2020-05-08 DIAGNOSIS — E785 Hyperlipidemia, unspecified: Secondary | ICD-10-CM | POA: Diagnosis not present

## 2020-05-08 DIAGNOSIS — M170 Bilateral primary osteoarthritis of knee: Secondary | ICD-10-CM | POA: Diagnosis not present

## 2020-05-08 DIAGNOSIS — I872 Venous insufficiency (chronic) (peripheral): Secondary | ICD-10-CM | POA: Diagnosis not present

## 2020-05-08 DIAGNOSIS — J449 Chronic obstructive pulmonary disease, unspecified: Secondary | ICD-10-CM | POA: Diagnosis not present

## 2020-05-08 DIAGNOSIS — I5032 Chronic diastolic (congestive) heart failure: Secondary | ICD-10-CM | POA: Diagnosis not present

## 2020-05-08 LAB — SURGICAL PATHOLOGY

## 2020-05-08 NOTE — Telephone Encounter (Signed)
Nurse Santiago Glad called and LVM stating Pt has not been taking his pain medication and Abx the last 4 days. After Pt's daughter called LVM stating they had misplaced the Rxs and they would need a RF of both.

## 2020-05-09 ENCOUNTER — Ambulatory Visit: Payer: Medicare Other

## 2020-05-09 ENCOUNTER — Other Ambulatory Visit: Payer: Self-pay | Admitting: Podiatry

## 2020-05-09 ENCOUNTER — Other Ambulatory Visit: Payer: Self-pay

## 2020-05-09 DIAGNOSIS — Z794 Long term (current) use of insulin: Secondary | ICD-10-CM

## 2020-05-09 DIAGNOSIS — I110 Hypertensive heart disease with heart failure: Secondary | ICD-10-CM

## 2020-05-09 DIAGNOSIS — E1140 Type 2 diabetes mellitus with diabetic neuropathy, unspecified: Secondary | ICD-10-CM

## 2020-05-09 NOTE — Telephone Encounter (Signed)
Please Advise
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
## 2020-05-09 NOTE — Patient Instructions (Addendum)
Visit Information  Goals Addressed            This Visit's Progress   . Pharmacy Care Plan       CARE PLAN ENTRY  Current Barriers:  . Chronic Disease Management support, education, and care coordination needs related to Hypertension and Diabetes   Hypertension . Pharmacist Clinical Goal(s): o Over the next 90 days, patient will work with PharmD and providers to achieve BP goal <130/80 . Current regimen:  o hydralazine 50 mg bid o metolazone 2.5 mg daily o torsemide 60 mg bid . Interventions: o Pharmacist helped patient reconcile medications. . Patient self care activities - Over the next 90 days, patient will: o Check BP daily, document, and provide at future appointments o Ensure daily salt intake < 2300 mg/day  Hyperlipidemia . Pharmacist Clinical Goal(s): o Over the next 90 days, patient will work with PharmD and providers to maintain LDL goal < 70 . Current regimen:  o Atorvastatin 40 mg daily . Interventions: o Recommend patient continue taking medication as prescribed.  . Patient self care activities - Over the next 90 days, patient will: o Take medication as prescribed   Diabetes . Pharmacist Clinical Goal(s): o Over the next 90 days, patient will work with PharmD and providers to achieve A1c goal <7% . Current regimen:  o glucagon kit o levemir flextouch 40 units daily o Ozempic 1 mg weekly . Interventions: o Recommend patient decrease Levemir to 40 units qhs to avoid low fasting blood sugars.  o Recommend patient begin taking Ozempic 1 mg weekly Sunday 05/13/2020. Marland Kitchen  o Coordinated with pharmacy to pick-up Ozempic 1 mg weekly.  . Patient self care activities - Over the next 90 days, patient will: o Check blood sugar twice daily, document, and provide at future appointments o Contact provider with any episodes of hypoglycemia  Medication management . Pharmacist Clinical Goal(s): o Over the next 90 days, patient will work with PharmD and providers to  achieve optimal medication adherence . Current pharmacy: [facility] . Interventions o Comprehensive medication review performed. o Continue current medication management strategy . Patient self care activities - Over the next 90 days, patient will: o Focus on medication adherence by using pill box o Take medications as prescribed o Report any questions or concerns to PharmD and/or provider(s)  Please see past updates related to this goal by clicking on the "Past Updates" button in the selected goal         The patient verbalized understanding of instructions provided today and declined a print copy of patient instruction materials.   Telephone follow up appointment with pharmacy team member scheduled for:05/2020  Sara Beth Sharilyn Geisinger, PharmD, [facility] Clinical Pharmacist [facility] [phone] (office) [phone] (mobile)   Blood Glucose Monitoring, Adult Monitoring your blood sugar (glucose) is an important part of managing your diabetes (diabetes mellitus). Blood glucose monitoring involves checking your blood glucose as often as directed and keeping a record (log) of your results over time. Checking your blood glucose regularly and keeping a blood glucose log can:  Help you and your health care provider adjust your diabetes management plan as needed, including your medicines or insulin.  Help you understand how food, exercise, illnesses, and medicines affect your blood glucose.  Let you know what your blood glucose is at any time. You can quickly find out if you have low blood glucose (hypoglycemia) or high blood glucose (hyperglycemia). Your health care provider will set individualized treatment goals for you. Your  goals will be based on your age, other medical conditions you have, and how you respond to diabetes treatment. Generally, the goal of treatment is to maintain the following blood glucose levels:  Before meals (preprandial): 80-130 mg/dL (4.4-7.2  mmol/L).  After meals (postprandial): below 180 mg/dL (10 mmol/L).  A1c level: less than 7%. Supplies needed:  Blood glucose meter.  Test strips for your meter. Each meter has its own strips. You must use the strips that came with your meter.  A needle to prick your finger (lancet). Do not use a lancet more than one time.  A device that holds the lancet (lancing device).  A journal or log book to write down your results. How to check your blood glucose  1. Wash your hands with soap and water. 2. Prick the side of your finger (not the tip) with the lancet. Use a different finger each time. 3. Gently rub the finger until a small drop of blood appears. 4. Follow instructions that come with your meter for inserting the test strip, applying blood to the strip, and using your blood glucose meter. 5. Write down your result and any notes. Some meters allow you to use areas of your body other than your finger (alternative sites) to test your blood. The most common alternative sites are:  Forearm.  Thigh.  Palm of the hand. If you think you may have hypoglycemia, or if you have a history of not knowing when your blood glucose is getting low (hypoglycemia unawareness), do not use alternative sites. Use your finger instead. Alternative sites may not be as accurate as the fingers, because blood flow is slower in these areas. This means that the result you get may be delayed, and it may be different from the result that you would get from your finger. Follow these instructions at home: Blood glucose log   Every time you check your blood glucose, write down your result. Also write down any notes about things that may be affecting your blood glucose, such as your diet and exercise for the day. This information can help you and your health care provider: ? Look for patterns in your blood glucose over time. ? Adjust your diabetes management plan as needed.  Check if your meter allows you to  download your records to a computer. Most glucose meters store a record of glucose readings in the meter. If you have type 1 diabetes:  Check your blood glucose 2 or more times a day.  Also check your blood glucose: ? Before every insulin injection. ? Before and after exercise. ? Before meals. ? 2 hours after a meal. ? Occasionally between 2:00 a.m. and 3:00 a.m., as directed. ? Before potentially dangerous tasks, like driving or using heavy machinery. ? At bedtime.  You may need to check your blood glucose more often, up to 6-10 times a day, if you: ? Use an insulin pump. ? Need multiple daily injections (MDI). ? Have diabetes that is not well-controlled. ? Are ill. ? Have a history of severe hypoglycemia. ? Have hypoglycemia unawareness. If you have type 2 diabetes:  If you take insulin or other diabetes medicines, check your blood glucose 2 or more times a day.  If you are on intensive insulin therapy, check your blood glucose 4 or more times a day. Occasionally, you may also need to check between 2:00 a.m. and 3:00 a.m., as directed.  Also check your blood glucose: ? Before and after exercise. ? Before potentially dangerous  tasks, like driving or using heavy machinery.  You may need to check your blood glucose more often if: ? Your medicine is being adjusted. ? Your diabetes is not well-controlled. ? You are ill. General tips  Always keep your supplies with you.  If you have questions or need help, all blood glucose meters have a 24-hour "hotline" phone number that you can call. You may also contact your health care provider.  After you use a few boxes of test strips, adjust (calibrate) your blood glucose meter by following instructions that came with your meter. Contact a health care provider if:  Your blood glucose is at or above 240 mg/dL (13.3 mmol/L) for 2 days in a row.  You have been sick or have had a fever for 2 days or longer, and you are not getting  better.  You have any of the following problems for more than 6 hours: ? You cannot eat or drink. ? You have nausea or vomiting. ? You have diarrhea. Get help right away if:  Your blood glucose is lower than 54 mg/dL (3 mmol/L).  You become confused or you have trouble thinking clearly.  You have difficulty breathing.  You have moderate or large ketone levels in your urine. Summary  Monitoring your blood sugar (glucose) is an important part of managing your diabetes (diabetes mellitus).  Blood glucose monitoring involves checking your blood glucose as often as directed and keeping a record (log) of your results over time.  Your health care provider will set individualized treatment goals for you. Your goals will be based on your age, other medical conditions you have, and how you respond to diabetes treatment.  Every time you check your blood glucose, write down your result. Also write down any notes about things that may be affecting your blood glucose, such as your diet and exercise for the day. This information is not intended to replace advice given to you by your health care provider. Make sure you discuss any questions you have with your health care provider. Document Revised: 06/11/2018 Document Reviewed: 01/28/2016 Elsevier Patient Education  2020 [employer].

## 2020-05-10 ENCOUNTER — Telehealth: Payer: Self-pay

## 2020-05-10 ENCOUNTER — Other Ambulatory Visit: Payer: Self-pay

## 2020-05-10 ENCOUNTER — Encounter: Payer: Medicare Other | Admitting: Podiatry

## 2020-05-10 MED ORDER — OZEMPIC (1 MG/DOSE) 2 MG/1.5ML ~~LOC~~ SOPN: 1.0000 mg | PEN_INJECTOR | SUBCUTANEOUS | 2 refills | Status: DC

## 2020-05-10 NOTE — Chronic Care Management (AMB) (Signed)
[name] called today after our visit yesterday to update me on today's fasting blood sugar of 56 mg/dL. He is unsteady on his feet and doesn't feel well with these numbers. We discussed his current insulin use or 40 units and increase of Ozempic to 1 mg beginning 05/13/2020.   Recommend reducing Levemir to 35 units daily.   Patient will call and follow-up with pharmacist on Monday or Tuesday with readings over the weekend. Patient will call pharmacist sooner if readings continue to be low with reduce insulin dose.   Sherre Poot, PharmD, [facility] Clinical Pharmacist [facility] [phone] (office) [phone] (mobile)

## 2020-05-11 ENCOUNTER — Encounter: Payer: Self-pay | Admitting: Sports Medicine

## 2020-05-11 ENCOUNTER — Other Ambulatory Visit: Payer: Self-pay

## 2020-05-11 ENCOUNTER — Ambulatory Visit (INDEPENDENT_AMBULATORY_CARE_PROVIDER_SITE_OTHER): Payer: Medicare Other | Admitting: Sports Medicine

## 2020-05-11 ENCOUNTER — Ambulatory Visit (INDEPENDENT_AMBULATORY_CARE_PROVIDER_SITE_OTHER): Payer: Medicare Other

## 2020-05-11 DIAGNOSIS — I739 Peripheral vascular disease, unspecified: Secondary | ICD-10-CM

## 2020-05-11 DIAGNOSIS — L089 Local infection of the skin and subcutaneous tissue, unspecified: Secondary | ICD-10-CM

## 2020-05-11 DIAGNOSIS — L97511 Non-pressure chronic ulcer of other part of right foot limited to breakdown of skin: Secondary | ICD-10-CM

## 2020-05-11 DIAGNOSIS — Z89421 Acquired absence of other right toe(s): Secondary | ICD-10-CM

## 2020-05-11 DIAGNOSIS — L02619 Cutaneous abscess of unspecified foot: Secondary | ICD-10-CM

## 2020-05-11 DIAGNOSIS — E11628 Type 2 diabetes mellitus with other skin complications: Secondary | ICD-10-CM

## 2020-05-11 DIAGNOSIS — E11621 Type 2 diabetes mellitus with foot ulcer: Secondary | ICD-10-CM

## 2020-05-11 DIAGNOSIS — M86171 Other acute osteomyelitis, right ankle and foot: Secondary | ICD-10-CM

## 2020-05-11 DIAGNOSIS — L03119 Cellulitis of unspecified part of limb: Secondary | ICD-10-CM

## 2020-05-11 NOTE — Progress Notes (Signed)
Subjective: Jon [name] is a 69 y.o. male patient seen today in office for POV #1 (DOS 05-04-20), S/P Right 2nd toe amputation performed by Dr. March Rummage. Patient admits pain at surgical site, unable to sleep at night, denies calf pain, denies headache, chest pain, shortness of breath, nausea, vomiting, fever, or chills. No other issues noted.   Patient Active Problem List   Diagnosis Date Noted  . Diabetic ulcer of toe of right foot associated with type 2 diabetes mellitus, with necrosis of bone ([place])   . Fall 03/07/2020  . Closed fracture of multiple carpal bones 03/07/2020  . Chronic respiratory failure with hypoxia ([place]) 02/11/2020  . Morbid obesity with BMI of 40.0-44.9, adult ([place]) 02/11/2020  . Moderate recurrent major depression ([place]) 02/11/2020  . Class 3 severe obesity in adult [facility]) 12/30/2019  . Uncomplicated opioid dependence ([place]) 12/17/2019  . Anemia of chronic disease 12/17/2019  . Ulcers of both lower legs ([place]) 12/17/2019  . Acquired thrombophilia ([place]) 12/17/2019  . Acute venous embolism and thrombosis of deep vessels of distal lower extremity ([place]) 11/13/2019  . Vitamin D insufficiency 10/11/2019  . PVD (peripheral vascular disease) ([place]) 09/28/2018  . DM (diabetes mellitus), type 2 with renal complications ([place]) 37/48/2707  . Drug induced constipation 11/10/2017  . Chronic pain of both knees 03/18/2017  . Primary osteoarthritis of both knees 03/18/2017  . Chronic diastolic heart failure ([place]) 02/22/2015  . LBBB (left bundle branch block) 02/22/2015  . OSA on CPAP 01/25/2015  . Back pain 01/25/2015  . Abnormal EKG 11/29/2014  . Normal coronary arteries 2011 11/29/2014  . Long term (current) use of anticoagulants 03/21/2014  . CVA (cerebral vascular accident) ([place]) 02/09/2014  . Chronic anticoagulation-Xarelto 02/09/2014  . Chronic atrial fibrillation ([place]) 01/03/2014  . Diabetic polyneuropathy associated with type 2 diabetes mellitus ([place]) 12/21/2013  .  Hypothyroidism   . Hypertensive heart disease with heart failure ([place])   . Restless leg syndrome   . GERD (gastroesophageal reflux disease)   . Sleep apnea   . Hyperlipidemia   . Neuropathy   . Bariatric surgery status 11/03/2013    Current Outpatient Medications on File Prior to Visit  Medication Sig Dispense Refill  . albuterol (PROVENTIL) (2.5 MG/3ML) 0.083% nebulizer solution Take 3 mLs (2.5 mg total) by nebulization 2 (two) times daily. And as needed (Patient taking differently: Take 2.5 mg by nebulization 2 (two) times daily as needed for wheezing. ) 75 mL 5  . atorvastatin (LIPITOR) 40 MG tablet Take 40 mg by mouth daily.    . Azelastine HCl 0.15 % SOLN Place 2 sprays into both nostrils daily.     . BD PEN NEEDLE NANO U/F 32G X 4 MM MISC     . clindamycin (CLEOCIN) 300 MG capsule TAKE 1 CAPSULE BY MOUTH 2 TIMES DAILY 14 capsule 0  . clopidogrel (PLAVIX) 75 MG tablet Take 1 tablet (75 mg total) by mouth daily. 30 tablet 5  . diltiazem (CARDIZEM CD) 120 MG 24 hr capsule Take 120 mg by mouth daily.    Marland Kitchen docusate sodium (COLACE) 100 MG capsule Take 100 mg by mouth 2 (two) times daily.    Marland Kitchen doxycycline (VIBRA-TABS) 100 MG tablet Take 1 tablet (100 mg total) by mouth 2 (two) times daily. (Patient not taking: Reported on 05/09/2020) 20 tablet 0  . ferrous sulfate 325 (65 FE) MG tablet Take 325 mg by mouth daily.    . Glucagon, rDNA, (GLUCAGON EMERGENCY) 1 MG KIT Inject 1 mg as  directed as needed (low blood sugar).     . hydrALAZINE (APRESOLINE) 100 MG tablet Take 50 mg by mouth 2 (two) times daily.    . insulin detemir (LEVEMIR FLEXTOUCH) 100 UNIT/ML FlexPen Inject 40 Units into the skin daily. 15 mL 11  . Lactulose 20 GM/30ML SOLN Take 30 mLs (20 g total) by mouth in the morning and at bedtime. 946 mL 2  . metolazone (ZAROXOLYN) 2.5 MG tablet TAKE 1 TABLET BY MOUTH DAILY 3 HOURS PRIOR TO LASIX DAILY (Patient taking differently: Take 2.5 mg by mouth daily. 3 hours prior to torsemide) 36  tablet 3  . mometasone-formoterol (DULERA) 200-5 MCG/ACT AERO Inhale 2 puffs into the lungs 2 (two) times daily as needed for wheezing.    . nitroGLYCERIN (NITROSTAT) 0.4 MG SL tablet Place 0.4 mg under the tongue every 5 (five) minutes as needed for chest pain.     Marland Kitchen oxyCODONE-acetaminophen (PERCOCET/ROXICET) 5-325 MG tablet TAKE 1 TABLET BY MOUTH EVERY 4 HOURS AS NEEDED FOR SEVERE PAIN 20 tablet 0  . OXYGEN Inhale into the lungs. 2 liters at bedtime    . pantoprazole (PROTONIX) 40 MG tablet Take 40 mg by mouth in the morning.     . Potassium Chloride ER 20 MEQ TBCR Take 40 mEq by mouth 2 (two) times daily.     . pregabalin (LYRICA) 75 MG capsule TAKE 1 CAPSULE BY MOUTH TWICE DAILY (Patient taking differently: Take 75 mg by mouth 2 (two) times daily. ) 180 capsule 2  . RELISTOR 150 MG TABS TAKE 3 TABLETS BY MOUTH EVERY MORNING (Patient taking differently: Take 450 mg by mouth daily. ) 90 tablet 0  . Semaglutide, 1 MG/DOSE, (OZEMPIC, 1 MG/DOSE,) 2 MG/1.5ML SOPN Inject 1 mg into the skin once a week. 3 mL 2  . torsemide (DEMADEX) 20 MG tablet Take 3 tablets (60 mg total) by mouth 2 (two) times daily. 180 tablet 0  . Vitamin D, Ergocalciferol, (DRISDOL) 1.25 MG (50000 UNIT) CAPS capsule TAKE 1 CAPSULE BY MOUTH DAILY 90 capsule 1  . XARELTO 15 MG TABS tablet TAKE 1 TABLET BY MOUTH ONCE DAILY (Patient taking differently: Take 15 mg by mouth at bedtime. ) 90 tablet 2  . XTAMPZA ER 27 MG C12A TAKE 1 CAPSULE BY MOUTH TWICE DAILY (Patient taking differently: Take 27 mg by mouth 2 (two) times daily. ) 60 capsule 0   No current facility-administered medications on file prior to visit.    Allergies  Allergen Reactions  . Hydrocodone Itching  . Morphine Itching  . Duloxetine Hcl Other (See Comments)    Unknown reaction  . Codeine Itching  . Penicillins Itching and Rash    DID THE REACTION INVOLVE: Swelling of the face/tongue/throat, SOB, or low BP? No Sudden or severe rash/hives, skin peeling, or the  inside of the mouth or nose? No Did it require medical treatment? No When did it last happen?unknown If all above answers are "NO", may proceed with cephalosporin use.     Objective: There were no vitals filed for this visit.  General: No acute distress, AAOx3  Right foot: Sutures intact with moderate maceration at surgical site, mild swelling to amputation stump site, blanchable erythema, no warmth, mild bloody drainage, no acute signs of infection noted, Capillary fill time present in all  remaining digits, no pain to right foot, No pain with calf compression.   Post Op Xray, Right consistent with amp status   Assessment and Plan:  Problem List Items Addressed This  Visit    None    Visit Diagnoses    S/P amputation of lesser toe, right (HCC)    -  Primary   Diabetic ulcer of toe of right foot associated with type 2 diabetes mellitus, limited to breakdown of skin ([place])       Relevant Orders   DG Foot Complete Right (Completed)   Cellulitis and abscess of foot, except toes       Acute osteomyelitis of toe, right (HCC)       Diabetic foot infection ([place])       PAD (peripheral artery disease) ([place])           -Patient seen and evaluated -Xray right foot reviewed  -Applied betadine and dry sterile dressing to surgical site right foot secured with coban wrap and stockinet  -Advised patient to make sure to keep dressings clean, dry, and intact -Advised patient to continue with post-op shoe on right foot -Advised patient to limit activity to necessity  -Advised patient to ice and elevate as necessary and continue with PRN meds; Clindamycin and Percocet already refilled by Dr. March Rummage earlier today -Will plan for post op check with Dr. March Rummage at next office visit. In the meantime, patient to call office if any issues or problems arise.   Landis Martins, DPM

## 2020-05-14 ENCOUNTER — Other Ambulatory Visit: Payer: Self-pay | Admitting: Family Medicine

## 2020-05-15 ENCOUNTER — Telehealth: Payer: Self-pay

## 2020-05-15 DIAGNOSIS — J449 Chronic obstructive pulmonary disease, unspecified: Secondary | ICD-10-CM | POA: Diagnosis not present

## 2020-05-15 DIAGNOSIS — I25119 Atherosclerotic heart disease of native coronary artery with unspecified angina pectoris: Secondary | ICD-10-CM | POA: Diagnosis not present

## 2020-05-15 DIAGNOSIS — M797 Fibromyalgia: Secondary | ICD-10-CM | POA: Diagnosis not present

## 2020-05-15 DIAGNOSIS — Z8673 Personal history of transient ischemic attack (TIA), and cerebral infarction without residual deficits: Secondary | ICD-10-CM | POA: Diagnosis not present

## 2020-05-15 DIAGNOSIS — Z9181 History of falling: Secondary | ICD-10-CM | POA: Diagnosis not present

## 2020-05-15 DIAGNOSIS — I5032 Chronic diastolic (congestive) heart failure: Secondary | ICD-10-CM | POA: Diagnosis not present

## 2020-05-15 DIAGNOSIS — I252 Old myocardial infarction: Secondary | ICD-10-CM | POA: Diagnosis not present

## 2020-05-15 DIAGNOSIS — G2581 Restless legs syndrome: Secondary | ICD-10-CM | POA: Diagnosis not present

## 2020-05-15 DIAGNOSIS — L97811 Non-pressure chronic ulcer of other part of right lower leg limited to breakdown of skin: Secondary | ICD-10-CM | POA: Diagnosis not present

## 2020-05-15 DIAGNOSIS — M170 Bilateral primary osteoarthritis of knee: Secondary | ICD-10-CM | POA: Diagnosis not present

## 2020-05-15 DIAGNOSIS — K219 Gastro-esophageal reflux disease without esophagitis: Secondary | ICD-10-CM | POA: Diagnosis not present

## 2020-05-15 DIAGNOSIS — E1142 Type 2 diabetes mellitus with diabetic polyneuropathy: Secondary | ICD-10-CM | POA: Diagnosis not present

## 2020-05-15 DIAGNOSIS — E039 Hypothyroidism, unspecified: Secondary | ICD-10-CM | POA: Diagnosis not present

## 2020-05-15 DIAGNOSIS — G4733 Obstructive sleep apnea (adult) (pediatric): Secondary | ICD-10-CM | POA: Diagnosis not present

## 2020-05-15 DIAGNOSIS — E785 Hyperlipidemia, unspecified: Secondary | ICD-10-CM | POA: Diagnosis not present

## 2020-05-15 DIAGNOSIS — I872 Venous insufficiency (chronic) (peripheral): Secondary | ICD-10-CM | POA: Diagnosis not present

## 2020-05-15 DIAGNOSIS — I482 Chronic atrial fibrillation, unspecified: Secondary | ICD-10-CM | POA: Diagnosis not present

## 2020-05-15 DIAGNOSIS — J9620 Acute and chronic respiratory failure, unspecified whether with hypoxia or hypercapnia: Secondary | ICD-10-CM | POA: Diagnosis not present

## 2020-05-15 DIAGNOSIS — I824Z2 Acute embolism and thrombosis of unspecified deep veins of left distal lower extremity: Secondary | ICD-10-CM | POA: Diagnosis not present

## 2020-05-15 DIAGNOSIS — E1122 Type 2 diabetes mellitus with diabetic chronic kidney disease: Secondary | ICD-10-CM | POA: Diagnosis not present

## 2020-05-15 DIAGNOSIS — E1151 Type 2 diabetes mellitus with diabetic peripheral angiopathy without gangrene: Secondary | ICD-10-CM | POA: Diagnosis not present

## 2020-05-15 DIAGNOSIS — N183 Chronic kidney disease, stage 3 unspecified: Secondary | ICD-10-CM | POA: Diagnosis not present

## 2020-05-15 DIAGNOSIS — Z9981 Dependence on supplemental oxygen: Secondary | ICD-10-CM | POA: Diagnosis not present

## 2020-05-15 DIAGNOSIS — I130 Hypertensive heart and chronic kidney disease with heart failure and stage 1 through stage 4 chronic kidney disease, or unspecified chronic kidney disease: Secondary | ICD-10-CM | POA: Diagnosis not present

## 2020-05-15 NOTE — Chronic Care Management (AMB) (Signed)
Jon [name] called reporting that his low blood sugar is improving with further reduced dose of Levemir (30 units daily).   His last 6 days of fasting blood sugars have been:  56, 60, 95, 84, 80, 126 mg/dL  He has not been able to begin Ozempic 1 mg dose so took 0.5 mg 05/13/2020. Pharmacy had to order Ozempic 1 mg dose and patient should be able to pick up this week before next Sunday's dose.   Patient will call with fasting blood sugar less than 100 mg/dL.   Sherre Poot, PharmD, [facility] Clinical Pharmacist [facility] [phone] (office) [phone] (mobile)

## 2020-05-15 NOTE — Telephone Encounter (Signed)
Both medications were refilled

## 2020-05-15 NOTE — Telephone Encounter (Signed)
Called and LVM to pt notifying both pain medication and Abx RF were sent today

## 2020-05-16 ENCOUNTER — Other Ambulatory Visit: Payer: Self-pay | Admitting: Podiatry

## 2020-05-17 ENCOUNTER — Other Ambulatory Visit: Payer: Self-pay

## 2020-05-17 ENCOUNTER — Ambulatory Visit (INDEPENDENT_AMBULATORY_CARE_PROVIDER_SITE_OTHER): Payer: Medicare Other | Admitting: Podiatry

## 2020-05-17 ENCOUNTER — Encounter: Payer: Self-pay | Admitting: Podiatry

## 2020-05-17 DIAGNOSIS — Z89421 Acquired absence of other right toe(s): Secondary | ICD-10-CM | POA: Diagnosis not present

## 2020-05-17 DIAGNOSIS — L02619 Cutaneous abscess of unspecified foot: Secondary | ICD-10-CM | POA: Diagnosis not present

## 2020-05-17 DIAGNOSIS — L03119 Cellulitis of unspecified part of limb: Secondary | ICD-10-CM | POA: Diagnosis not present

## 2020-05-17 DIAGNOSIS — L97511 Non-pressure chronic ulcer of other part of right foot limited to breakdown of skin: Secondary | ICD-10-CM

## 2020-05-17 DIAGNOSIS — E11621 Type 2 diabetes mellitus with foot ulcer: Secondary | ICD-10-CM | POA: Diagnosis not present

## 2020-05-17 NOTE — Telephone Encounter (Signed)
Please advise 

## 2020-05-17 NOTE — Progress Notes (Signed)
  Subjective:  Patient ID: Jon [name], male    DOB: 06/21/1951,  MRN: [id number]  Chief Complaint  Patient presents with  . Routine Post Op    doing ok on the right foot and the right leg is wrapped due to sores    DOS: 05/04/20 Procedure: Right 2nd toe amputation - MPJ level  69 y.o. male presents with the above complaint. History confirmed with patient. States he bumped his toe going to the bathroom.  Objective:  Physical Exam: no tenderness at the surgical site and local edema noted. Incision: healing well, no significant erythema, slight clear drainage present. Dressing intact to leg.  Assessment:   1. Diabetic ulcer of toe of right foot associated with type 2 diabetes mellitus, limited to breakdown of skin ([place])   2. S/P amputation of lesser toe, right (HCC)   3. Cellulitis and abscess of foot, except toes     Plan:  Patient was evaluated and treated and all questions answered.  Post-operative State -Open wound area likely from hitting his toe, this was cauterized with silver nitrate -Dressing applied consisting of band-aid -WBAT in Surgical shoe -[place] orders placed as patient currently has [place].  No follow-ups on file.

## 2020-05-18 ENCOUNTER — Other Ambulatory Visit: Payer: Self-pay | Admitting: Family Medicine

## 2020-05-18 DIAGNOSIS — L97811 Non-pressure chronic ulcer of other part of right lower leg limited to breakdown of skin: Secondary | ICD-10-CM | POA: Diagnosis not present

## 2020-05-18 DIAGNOSIS — I5032 Chronic diastolic (congestive) heart failure: Secondary | ICD-10-CM | POA: Diagnosis not present

## 2020-05-18 DIAGNOSIS — G2581 Restless legs syndrome: Secondary | ICD-10-CM | POA: Diagnosis not present

## 2020-05-18 DIAGNOSIS — Z9181 History of falling: Secondary | ICD-10-CM | POA: Diagnosis not present

## 2020-05-18 DIAGNOSIS — M797 Fibromyalgia: Secondary | ICD-10-CM | POA: Diagnosis not present

## 2020-05-18 DIAGNOSIS — E1122 Type 2 diabetes mellitus with diabetic chronic kidney disease: Secondary | ICD-10-CM | POA: Diagnosis not present

## 2020-05-18 DIAGNOSIS — J449 Chronic obstructive pulmonary disease, unspecified: Secondary | ICD-10-CM | POA: Diagnosis not present

## 2020-05-18 DIAGNOSIS — E1151 Type 2 diabetes mellitus with diabetic peripheral angiopathy without gangrene: Secondary | ICD-10-CM | POA: Diagnosis not present

## 2020-05-18 DIAGNOSIS — E785 Hyperlipidemia, unspecified: Secondary | ICD-10-CM | POA: Diagnosis not present

## 2020-05-18 DIAGNOSIS — Z9981 Dependence on supplemental oxygen: Secondary | ICD-10-CM | POA: Diagnosis not present

## 2020-05-18 DIAGNOSIS — Z8673 Personal history of transient ischemic attack (TIA), and cerebral infarction without residual deficits: Secondary | ICD-10-CM | POA: Diagnosis not present

## 2020-05-18 DIAGNOSIS — I872 Venous insufficiency (chronic) (peripheral): Secondary | ICD-10-CM | POA: Diagnosis not present

## 2020-05-18 DIAGNOSIS — I25119 Atherosclerotic heart disease of native coronary artery with unspecified angina pectoris: Secondary | ICD-10-CM | POA: Diagnosis not present

## 2020-05-18 DIAGNOSIS — K219 Gastro-esophageal reflux disease without esophagitis: Secondary | ICD-10-CM | POA: Diagnosis not present

## 2020-05-18 DIAGNOSIS — N183 Chronic kidney disease, stage 3 unspecified: Secondary | ICD-10-CM | POA: Diagnosis not present

## 2020-05-18 DIAGNOSIS — G4733 Obstructive sleep apnea (adult) (pediatric): Secondary | ICD-10-CM | POA: Diagnosis not present

## 2020-05-18 DIAGNOSIS — J9620 Acute and chronic respiratory failure, unspecified whether with hypoxia or hypercapnia: Secondary | ICD-10-CM | POA: Diagnosis not present

## 2020-05-18 DIAGNOSIS — I482 Chronic atrial fibrillation, unspecified: Secondary | ICD-10-CM | POA: Diagnosis not present

## 2020-05-18 DIAGNOSIS — I130 Hypertensive heart and chronic kidney disease with heart failure and stage 1 through stage 4 chronic kidney disease, or unspecified chronic kidney disease: Secondary | ICD-10-CM | POA: Diagnosis not present

## 2020-05-18 DIAGNOSIS — I824Z2 Acute embolism and thrombosis of unspecified deep veins of left distal lower extremity: Secondary | ICD-10-CM | POA: Diagnosis not present

## 2020-05-18 DIAGNOSIS — M170 Bilateral primary osteoarthritis of knee: Secondary | ICD-10-CM | POA: Diagnosis not present

## 2020-05-18 DIAGNOSIS — E1142 Type 2 diabetes mellitus with diabetic polyneuropathy: Secondary | ICD-10-CM | POA: Diagnosis not present

## 2020-05-18 DIAGNOSIS — E039 Hypothyroidism, unspecified: Secondary | ICD-10-CM | POA: Diagnosis not present

## 2020-05-18 DIAGNOSIS — I252 Old myocardial infarction: Secondary | ICD-10-CM | POA: Diagnosis not present

## 2020-05-21 ENCOUNTER — Other Ambulatory Visit: Payer: Self-pay | Admitting: Family Medicine

## 2020-05-21 DIAGNOSIS — E1151 Type 2 diabetes mellitus with diabetic peripheral angiopathy without gangrene: Secondary | ICD-10-CM | POA: Diagnosis not present

## 2020-05-21 DIAGNOSIS — I872 Venous insufficiency (chronic) (peripheral): Secondary | ICD-10-CM | POA: Diagnosis not present

## 2020-05-21 DIAGNOSIS — L97811 Non-pressure chronic ulcer of other part of right lower leg limited to breakdown of skin: Secondary | ICD-10-CM | POA: Diagnosis not present

## 2020-05-21 MED ORDER — LEVEMIR FLEXTOUCH 100 UNIT/ML ~~LOC~~ SOPN
26.0000 [IU] | PEN_INJECTOR | Freq: Every day | SUBCUTANEOUS | 11 refills | Status: DC
Start: 2020-05-21 — End: 2020-07-05

## 2020-05-22 DIAGNOSIS — I5032 Chronic diastolic (congestive) heart failure: Secondary | ICD-10-CM | POA: Diagnosis not present

## 2020-05-22 DIAGNOSIS — J9620 Acute and chronic respiratory failure, unspecified whether with hypoxia or hypercapnia: Secondary | ICD-10-CM | POA: Diagnosis not present

## 2020-05-22 DIAGNOSIS — E1122 Type 2 diabetes mellitus with diabetic chronic kidney disease: Secondary | ICD-10-CM | POA: Diagnosis not present

## 2020-05-22 DIAGNOSIS — Z9981 Dependence on supplemental oxygen: Secondary | ICD-10-CM | POA: Diagnosis not present

## 2020-05-22 DIAGNOSIS — E785 Hyperlipidemia, unspecified: Secondary | ICD-10-CM | POA: Diagnosis not present

## 2020-05-22 DIAGNOSIS — I824Z2 Acute embolism and thrombosis of unspecified deep veins of left distal lower extremity: Secondary | ICD-10-CM | POA: Diagnosis not present

## 2020-05-22 DIAGNOSIS — Z8673 Personal history of transient ischemic attack (TIA), and cerebral infarction without residual deficits: Secondary | ICD-10-CM | POA: Diagnosis not present

## 2020-05-22 DIAGNOSIS — I25119 Atherosclerotic heart disease of native coronary artery with unspecified angina pectoris: Secondary | ICD-10-CM | POA: Diagnosis not present

## 2020-05-22 DIAGNOSIS — M170 Bilateral primary osteoarthritis of knee: Secondary | ICD-10-CM | POA: Diagnosis not present

## 2020-05-22 DIAGNOSIS — N183 Chronic kidney disease, stage 3 unspecified: Secondary | ICD-10-CM | POA: Diagnosis not present

## 2020-05-22 DIAGNOSIS — Z9181 History of falling: Secondary | ICD-10-CM | POA: Diagnosis not present

## 2020-05-22 DIAGNOSIS — I482 Chronic atrial fibrillation, unspecified: Secondary | ICD-10-CM | POA: Diagnosis not present

## 2020-05-22 DIAGNOSIS — J449 Chronic obstructive pulmonary disease, unspecified: Secondary | ICD-10-CM | POA: Diagnosis not present

## 2020-05-22 DIAGNOSIS — E039 Hypothyroidism, unspecified: Secondary | ICD-10-CM | POA: Diagnosis not present

## 2020-05-22 DIAGNOSIS — K219 Gastro-esophageal reflux disease without esophagitis: Secondary | ICD-10-CM | POA: Diagnosis not present

## 2020-05-22 DIAGNOSIS — I252 Old myocardial infarction: Secondary | ICD-10-CM | POA: Diagnosis not present

## 2020-05-22 DIAGNOSIS — E1142 Type 2 diabetes mellitus with diabetic polyneuropathy: Secondary | ICD-10-CM | POA: Diagnosis not present

## 2020-05-22 DIAGNOSIS — M797 Fibromyalgia: Secondary | ICD-10-CM | POA: Diagnosis not present

## 2020-05-22 DIAGNOSIS — L97811 Non-pressure chronic ulcer of other part of right lower leg limited to breakdown of skin: Secondary | ICD-10-CM | POA: Diagnosis not present

## 2020-05-22 DIAGNOSIS — E1151 Type 2 diabetes mellitus with diabetic peripheral angiopathy without gangrene: Secondary | ICD-10-CM | POA: Diagnosis not present

## 2020-05-22 DIAGNOSIS — G2581 Restless legs syndrome: Secondary | ICD-10-CM | POA: Diagnosis not present

## 2020-05-22 DIAGNOSIS — I130 Hypertensive heart and chronic kidney disease with heart failure and stage 1 through stage 4 chronic kidney disease, or unspecified chronic kidney disease: Secondary | ICD-10-CM | POA: Diagnosis not present

## 2020-05-22 DIAGNOSIS — I872 Venous insufficiency (chronic) (peripheral): Secondary | ICD-10-CM | POA: Diagnosis not present

## 2020-05-22 DIAGNOSIS — G4733 Obstructive sleep apnea (adult) (pediatric): Secondary | ICD-10-CM | POA: Diagnosis not present

## 2020-05-24 ENCOUNTER — Ambulatory Visit: Payer: Self-pay

## 2020-05-24 NOTE — Chronic Care Management (AMB) (Signed)
Jon [name] called reporting that his low blood sugar is improving with further reduced dose of Levemir (30 units daily).   Recent blood sugar readings (am/noon/evening):  05/20/2020: 332 96 237 mg/dL 05/21/2020 352 401 428 mg/dL 05/22/2020: 70 126 201 mg/dL 05/23/2020: 161 207  218 mg/dL 05/24/2020: 172 223   --- mg/dL   He has had 1 dose of 1 mg Ozempic last weekend. Will continue with 1 mg dose.   Patient will call with fasting blood sugar less than 100 mg/dL or >250 mg/dL.   Sherre Poot, PharmD, [facility] Clinical Pharmacist [facility] [phone] (office) [phone] (mobile)

## 2020-05-25 DIAGNOSIS — I25119 Atherosclerotic heart disease of native coronary artery with unspecified angina pectoris: Secondary | ICD-10-CM | POA: Diagnosis not present

## 2020-05-25 DIAGNOSIS — E785 Hyperlipidemia, unspecified: Secondary | ICD-10-CM | POA: Diagnosis not present

## 2020-05-25 DIAGNOSIS — I130 Hypertensive heart and chronic kidney disease with heart failure and stage 1 through stage 4 chronic kidney disease, or unspecified chronic kidney disease: Secondary | ICD-10-CM | POA: Diagnosis not present

## 2020-05-25 DIAGNOSIS — E039 Hypothyroidism, unspecified: Secondary | ICD-10-CM | POA: Diagnosis not present

## 2020-05-25 DIAGNOSIS — Z8673 Personal history of transient ischemic attack (TIA), and cerebral infarction without residual deficits: Secondary | ICD-10-CM | POA: Diagnosis not present

## 2020-05-25 DIAGNOSIS — I872 Venous insufficiency (chronic) (peripheral): Secondary | ICD-10-CM | POA: Diagnosis not present

## 2020-05-25 DIAGNOSIS — E1142 Type 2 diabetes mellitus with diabetic polyneuropathy: Secondary | ICD-10-CM | POA: Diagnosis not present

## 2020-05-25 DIAGNOSIS — E1151 Type 2 diabetes mellitus with diabetic peripheral angiopathy without gangrene: Secondary | ICD-10-CM | POA: Diagnosis not present

## 2020-05-25 DIAGNOSIS — N183 Chronic kidney disease, stage 3 unspecified: Secondary | ICD-10-CM | POA: Diagnosis not present

## 2020-05-25 DIAGNOSIS — L97811 Non-pressure chronic ulcer of other part of right lower leg limited to breakdown of skin: Secondary | ICD-10-CM | POA: Diagnosis not present

## 2020-05-25 DIAGNOSIS — I252 Old myocardial infarction: Secondary | ICD-10-CM | POA: Diagnosis not present

## 2020-05-25 DIAGNOSIS — G2581 Restless legs syndrome: Secondary | ICD-10-CM | POA: Diagnosis not present

## 2020-05-25 DIAGNOSIS — Z9181 History of falling: Secondary | ICD-10-CM | POA: Diagnosis not present

## 2020-05-25 DIAGNOSIS — I482 Chronic atrial fibrillation, unspecified: Secondary | ICD-10-CM | POA: Diagnosis not present

## 2020-05-25 DIAGNOSIS — M797 Fibromyalgia: Secondary | ICD-10-CM | POA: Diagnosis not present

## 2020-05-25 DIAGNOSIS — I824Z2 Acute embolism and thrombosis of unspecified deep veins of left distal lower extremity: Secondary | ICD-10-CM | POA: Diagnosis not present

## 2020-05-25 DIAGNOSIS — J9610 Chronic respiratory failure, unspecified whether with hypoxia or hypercapnia: Secondary | ICD-10-CM | POA: Diagnosis not present

## 2020-05-25 DIAGNOSIS — J449 Chronic obstructive pulmonary disease, unspecified: Secondary | ICD-10-CM | POA: Diagnosis not present

## 2020-05-25 DIAGNOSIS — I5032 Chronic diastolic (congestive) heart failure: Secondary | ICD-10-CM | POA: Diagnosis not present

## 2020-05-25 DIAGNOSIS — K219 Gastro-esophageal reflux disease without esophagitis: Secondary | ICD-10-CM | POA: Diagnosis not present

## 2020-05-25 DIAGNOSIS — E1122 Type 2 diabetes mellitus with diabetic chronic kidney disease: Secondary | ICD-10-CM | POA: Diagnosis not present

## 2020-05-25 DIAGNOSIS — M170 Bilateral primary osteoarthritis of knee: Secondary | ICD-10-CM | POA: Diagnosis not present

## 2020-05-25 DIAGNOSIS — G4733 Obstructive sleep apnea (adult) (pediatric): Secondary | ICD-10-CM | POA: Diagnosis not present

## 2020-05-25 DIAGNOSIS — Z9981 Dependence on supplemental oxygen: Secondary | ICD-10-CM | POA: Diagnosis not present

## 2020-05-25 DIAGNOSIS — J9620 Acute and chronic respiratory failure, unspecified whether with hypoxia or hypercapnia: Secondary | ICD-10-CM | POA: Diagnosis not present

## 2020-05-26 ENCOUNTER — Other Ambulatory Visit: Payer: Self-pay | Admitting: Podiatry

## 2020-05-26 NOTE — Telephone Encounter (Signed)
Please Advise

## 2020-05-27 DIAGNOSIS — J9610 Chronic respiratory failure, unspecified whether with hypoxia or hypercapnia: Secondary | ICD-10-CM | POA: Diagnosis not present

## 2020-05-29 DIAGNOSIS — Z8673 Personal history of transient ischemic attack (TIA), and cerebral infarction without residual deficits: Secondary | ICD-10-CM | POA: Diagnosis not present

## 2020-05-29 DIAGNOSIS — E1142 Type 2 diabetes mellitus with diabetic polyneuropathy: Secondary | ICD-10-CM | POA: Diagnosis not present

## 2020-05-29 DIAGNOSIS — J449 Chronic obstructive pulmonary disease, unspecified: Secondary | ICD-10-CM | POA: Diagnosis not present

## 2020-05-29 DIAGNOSIS — M170 Bilateral primary osteoarthritis of knee: Secondary | ICD-10-CM | POA: Diagnosis not present

## 2020-05-29 DIAGNOSIS — I872 Venous insufficiency (chronic) (peripheral): Secondary | ICD-10-CM | POA: Diagnosis not present

## 2020-05-29 DIAGNOSIS — N183 Chronic kidney disease, stage 3 unspecified: Secondary | ICD-10-CM | POA: Diagnosis not present

## 2020-05-29 DIAGNOSIS — I25119 Atherosclerotic heart disease of native coronary artery with unspecified angina pectoris: Secondary | ICD-10-CM | POA: Diagnosis not present

## 2020-05-29 DIAGNOSIS — Z9981 Dependence on supplemental oxygen: Secondary | ICD-10-CM | POA: Diagnosis not present

## 2020-05-29 DIAGNOSIS — G4733 Obstructive sleep apnea (adult) (pediatric): Secondary | ICD-10-CM | POA: Diagnosis not present

## 2020-05-29 DIAGNOSIS — M797 Fibromyalgia: Secondary | ICD-10-CM | POA: Diagnosis not present

## 2020-05-29 DIAGNOSIS — I130 Hypertensive heart and chronic kidney disease with heart failure and stage 1 through stage 4 chronic kidney disease, or unspecified chronic kidney disease: Secondary | ICD-10-CM | POA: Diagnosis not present

## 2020-05-29 DIAGNOSIS — E039 Hypothyroidism, unspecified: Secondary | ICD-10-CM | POA: Diagnosis not present

## 2020-05-29 DIAGNOSIS — Z9181 History of falling: Secondary | ICD-10-CM | POA: Diagnosis not present

## 2020-05-29 DIAGNOSIS — J9620 Acute and chronic respiratory failure, unspecified whether with hypoxia or hypercapnia: Secondary | ICD-10-CM | POA: Diagnosis not present

## 2020-05-29 DIAGNOSIS — L97811 Non-pressure chronic ulcer of other part of right lower leg limited to breakdown of skin: Secondary | ICD-10-CM | POA: Diagnosis not present

## 2020-05-29 DIAGNOSIS — E1122 Type 2 diabetes mellitus with diabetic chronic kidney disease: Secondary | ICD-10-CM | POA: Diagnosis not present

## 2020-05-29 DIAGNOSIS — E1151 Type 2 diabetes mellitus with diabetic peripheral angiopathy without gangrene: Secondary | ICD-10-CM | POA: Diagnosis not present

## 2020-05-29 DIAGNOSIS — I252 Old myocardial infarction: Secondary | ICD-10-CM | POA: Diagnosis not present

## 2020-05-29 DIAGNOSIS — I5032 Chronic diastolic (congestive) heart failure: Secondary | ICD-10-CM | POA: Diagnosis not present

## 2020-05-29 DIAGNOSIS — K219 Gastro-esophageal reflux disease without esophagitis: Secondary | ICD-10-CM | POA: Diagnosis not present

## 2020-05-29 DIAGNOSIS — G2581 Restless legs syndrome: Secondary | ICD-10-CM | POA: Diagnosis not present

## 2020-05-29 DIAGNOSIS — I482 Chronic atrial fibrillation, unspecified: Secondary | ICD-10-CM | POA: Diagnosis not present

## 2020-05-29 DIAGNOSIS — E785 Hyperlipidemia, unspecified: Secondary | ICD-10-CM | POA: Diagnosis not present

## 2020-05-29 DIAGNOSIS — I824Z2 Acute embolism and thrombosis of unspecified deep veins of left distal lower extremity: Secondary | ICD-10-CM | POA: Diagnosis not present

## 2020-05-31 ENCOUNTER — Ambulatory Visit: Payer: Medicare Other | Admitting: Podiatry

## 2020-06-01 DIAGNOSIS — I130 Hypertensive heart and chronic kidney disease with heart failure and stage 1 through stage 4 chronic kidney disease, or unspecified chronic kidney disease: Secondary | ICD-10-CM | POA: Diagnosis not present

## 2020-06-01 DIAGNOSIS — M797 Fibromyalgia: Secondary | ICD-10-CM | POA: Diagnosis not present

## 2020-06-01 DIAGNOSIS — Z9181 History of falling: Secondary | ICD-10-CM | POA: Diagnosis not present

## 2020-06-01 DIAGNOSIS — J9620 Acute and chronic respiratory failure, unspecified whether with hypoxia or hypercapnia: Secondary | ICD-10-CM | POA: Diagnosis not present

## 2020-06-01 DIAGNOSIS — E1142 Type 2 diabetes mellitus with diabetic polyneuropathy: Secondary | ICD-10-CM | POA: Diagnosis not present

## 2020-06-01 DIAGNOSIS — N183 Chronic kidney disease, stage 3 unspecified: Secondary | ICD-10-CM | POA: Diagnosis not present

## 2020-06-01 DIAGNOSIS — E785 Hyperlipidemia, unspecified: Secondary | ICD-10-CM | POA: Diagnosis not present

## 2020-06-01 DIAGNOSIS — Z9981 Dependence on supplemental oxygen: Secondary | ICD-10-CM | POA: Diagnosis not present

## 2020-06-01 DIAGNOSIS — I482 Chronic atrial fibrillation, unspecified: Secondary | ICD-10-CM | POA: Diagnosis not present

## 2020-06-01 DIAGNOSIS — G2581 Restless legs syndrome: Secondary | ICD-10-CM | POA: Diagnosis not present

## 2020-06-01 DIAGNOSIS — K219 Gastro-esophageal reflux disease without esophagitis: Secondary | ICD-10-CM | POA: Diagnosis not present

## 2020-06-01 DIAGNOSIS — L97811 Non-pressure chronic ulcer of other part of right lower leg limited to breakdown of skin: Secondary | ICD-10-CM | POA: Diagnosis not present

## 2020-06-01 DIAGNOSIS — E1151 Type 2 diabetes mellitus with diabetic peripheral angiopathy without gangrene: Secondary | ICD-10-CM | POA: Diagnosis not present

## 2020-06-01 DIAGNOSIS — I25119 Atherosclerotic heart disease of native coronary artery with unspecified angina pectoris: Secondary | ICD-10-CM | POA: Diagnosis not present

## 2020-06-01 DIAGNOSIS — M170 Bilateral primary osteoarthritis of knee: Secondary | ICD-10-CM | POA: Diagnosis not present

## 2020-06-01 DIAGNOSIS — J449 Chronic obstructive pulmonary disease, unspecified: Secondary | ICD-10-CM | POA: Diagnosis not present

## 2020-06-01 DIAGNOSIS — E1122 Type 2 diabetes mellitus with diabetic chronic kidney disease: Secondary | ICD-10-CM | POA: Diagnosis not present

## 2020-06-01 DIAGNOSIS — Z8673 Personal history of transient ischemic attack (TIA), and cerebral infarction without residual deficits: Secondary | ICD-10-CM | POA: Diagnosis not present

## 2020-06-01 DIAGNOSIS — I824Z2 Acute embolism and thrombosis of unspecified deep veins of left distal lower extremity: Secondary | ICD-10-CM | POA: Diagnosis not present

## 2020-06-01 DIAGNOSIS — G4733 Obstructive sleep apnea (adult) (pediatric): Secondary | ICD-10-CM | POA: Diagnosis not present

## 2020-06-01 DIAGNOSIS — E039 Hypothyroidism, unspecified: Secondary | ICD-10-CM | POA: Diagnosis not present

## 2020-06-01 DIAGNOSIS — I252 Old myocardial infarction: Secondary | ICD-10-CM | POA: Diagnosis not present

## 2020-06-01 DIAGNOSIS — I5032 Chronic diastolic (congestive) heart failure: Secondary | ICD-10-CM | POA: Diagnosis not present

## 2020-06-01 DIAGNOSIS — I872 Venous insufficiency (chronic) (peripheral): Secondary | ICD-10-CM | POA: Diagnosis not present

## 2020-06-04 ENCOUNTER — Other Ambulatory Visit: Payer: Self-pay | Admitting: Family Medicine

## 2020-06-04 ENCOUNTER — Encounter: Payer: Self-pay | Admitting: Podiatry

## 2020-06-04 ENCOUNTER — Other Ambulatory Visit: Payer: Self-pay | Admitting: Legal Medicine

## 2020-06-04 ENCOUNTER — Other Ambulatory Visit: Payer: Self-pay

## 2020-06-04 ENCOUNTER — Ambulatory Visit (INDEPENDENT_AMBULATORY_CARE_PROVIDER_SITE_OTHER): Payer: Medicare Other | Admitting: Podiatry

## 2020-06-04 DIAGNOSIS — L97511 Non-pressure chronic ulcer of other part of right foot limited to breakdown of skin: Secondary | ICD-10-CM

## 2020-06-04 DIAGNOSIS — E11621 Type 2 diabetes mellitus with foot ulcer: Secondary | ICD-10-CM | POA: Diagnosis not present

## 2020-06-04 DIAGNOSIS — Z89421 Acquired absence of other right toe(s): Secondary | ICD-10-CM | POA: Diagnosis not present

## 2020-06-04 NOTE — Progress Notes (Signed)
  Subjective:  Patient ID: Jon [name], male    DOB: 22-Oct-1950,  MRN: [id number]  No chief complaint on file.   DOS: 05/04/20 Procedure: Right 2nd toe amputation - MPJ level  69 y.o. male presents for f/u of surgery. States he has had a band-aid on it. Here for suture removal today.  Objective:  Physical Exam: no tenderness at the surgical site and local edema noted. Incision: healing well, no significant erythema, slight clear drainage present.   Assessment:   1. Diabetic ulcer of toe of right foot associated with type 2 diabetes mellitus, limited to breakdown of skin ([place])   2. S/P amputation of lesser toe, right ([place])     Plan:  Patient was evaluated and treated and all questions answered.  Post-operative State -Sutures removed -Ok to shower -Dressed with abx ointment and band-aid. Pt to do so daily -Ok to return to normal shoegear in 1 week -F/u in 1 month  No follow-ups on file.

## 2020-06-05 ENCOUNTER — Other Ambulatory Visit: Payer: Self-pay | Admitting: Family Medicine

## 2020-06-05 ENCOUNTER — Telehealth: Payer: Medicare Other

## 2020-06-05 DIAGNOSIS — G2581 Restless legs syndrome: Secondary | ICD-10-CM | POA: Diagnosis not present

## 2020-06-05 DIAGNOSIS — Z8673 Personal history of transient ischemic attack (TIA), and cerebral infarction without residual deficits: Secondary | ICD-10-CM | POA: Diagnosis not present

## 2020-06-05 DIAGNOSIS — E1142 Type 2 diabetes mellitus with diabetic polyneuropathy: Secondary | ICD-10-CM | POA: Diagnosis not present

## 2020-06-05 DIAGNOSIS — I5032 Chronic diastolic (congestive) heart failure: Secondary | ICD-10-CM | POA: Diagnosis not present

## 2020-06-05 DIAGNOSIS — I130 Hypertensive heart and chronic kidney disease with heart failure and stage 1 through stage 4 chronic kidney disease, or unspecified chronic kidney disease: Secondary | ICD-10-CM | POA: Diagnosis not present

## 2020-06-05 DIAGNOSIS — I482 Chronic atrial fibrillation, unspecified: Secondary | ICD-10-CM | POA: Diagnosis not present

## 2020-06-05 DIAGNOSIS — J449 Chronic obstructive pulmonary disease, unspecified: Secondary | ICD-10-CM | POA: Diagnosis not present

## 2020-06-05 DIAGNOSIS — G4733 Obstructive sleep apnea (adult) (pediatric): Secondary | ICD-10-CM | POA: Diagnosis not present

## 2020-06-05 DIAGNOSIS — Z9181 History of falling: Secondary | ICD-10-CM | POA: Diagnosis not present

## 2020-06-05 DIAGNOSIS — M170 Bilateral primary osteoarthritis of knee: Secondary | ICD-10-CM | POA: Diagnosis not present

## 2020-06-05 DIAGNOSIS — E785 Hyperlipidemia, unspecified: Secondary | ICD-10-CM | POA: Diagnosis not present

## 2020-06-05 DIAGNOSIS — M797 Fibromyalgia: Secondary | ICD-10-CM | POA: Diagnosis not present

## 2020-06-05 DIAGNOSIS — K219 Gastro-esophageal reflux disease without esophagitis: Secondary | ICD-10-CM | POA: Diagnosis not present

## 2020-06-05 DIAGNOSIS — J9620 Acute and chronic respiratory failure, unspecified whether with hypoxia or hypercapnia: Secondary | ICD-10-CM | POA: Diagnosis not present

## 2020-06-05 DIAGNOSIS — I25119 Atherosclerotic heart disease of native coronary artery with unspecified angina pectoris: Secondary | ICD-10-CM | POA: Diagnosis not present

## 2020-06-05 DIAGNOSIS — E1151 Type 2 diabetes mellitus with diabetic peripheral angiopathy without gangrene: Secondary | ICD-10-CM | POA: Diagnosis not present

## 2020-06-05 DIAGNOSIS — E039 Hypothyroidism, unspecified: Secondary | ICD-10-CM | POA: Diagnosis not present

## 2020-06-05 DIAGNOSIS — I824Z2 Acute embolism and thrombosis of unspecified deep veins of left distal lower extremity: Secondary | ICD-10-CM | POA: Diagnosis not present

## 2020-06-05 DIAGNOSIS — I252 Old myocardial infarction: Secondary | ICD-10-CM | POA: Diagnosis not present

## 2020-06-05 DIAGNOSIS — N183 Chronic kidney disease, stage 3 unspecified: Secondary | ICD-10-CM | POA: Diagnosis not present

## 2020-06-05 DIAGNOSIS — I872 Venous insufficiency (chronic) (peripheral): Secondary | ICD-10-CM | POA: Diagnosis not present

## 2020-06-05 DIAGNOSIS — Z9981 Dependence on supplemental oxygen: Secondary | ICD-10-CM | POA: Diagnosis not present

## 2020-06-05 DIAGNOSIS — E1122 Type 2 diabetes mellitus with diabetic chronic kidney disease: Secondary | ICD-10-CM | POA: Diagnosis not present

## 2020-06-05 DIAGNOSIS — L97811 Non-pressure chronic ulcer of other part of right lower leg limited to breakdown of skin: Secondary | ICD-10-CM | POA: Diagnosis not present

## 2020-06-06 ENCOUNTER — Telehealth (INDEPENDENT_AMBULATORY_CARE_PROVIDER_SITE_OTHER): Payer: Medicare Other | Admitting: Family Medicine

## 2020-06-06 ENCOUNTER — Other Ambulatory Visit: Payer: Self-pay | Admitting: Family Medicine

## 2020-06-06 VITALS — HR 88 | Ht 67.0 in | Wt 266.0 lb

## 2020-06-06 DIAGNOSIS — R059 Cough, unspecified: Secondary | ICD-10-CM

## 2020-06-06 DIAGNOSIS — J029 Acute pharyngitis, unspecified: Secondary | ICD-10-CM

## 2020-06-06 NOTE — Progress Notes (Signed)
Virtual Visit via Telephone Note   This visit type was conducted due to national recommendations for restrictions regarding the COVID-19 Pandemic (e.g. social distancing) in an effort to limit this patient's exposure and mitigate transmission in our community.  Due to his co-morbid illnesses, this patient is at least at moderate risk for complications without adequate follow up.  This format is felt to be most appropriate for this patient at this time.  The patient did not have access to video technology/had technical difficulties with video requiring transitioning to audio format only (telephone).  All issues noted in this document were discussed and addressed.  No physical exam could be performed with this format.  Patient verbally consented to a telehealth visit.   Date:  06/06/2020   ID:  Jon [name], DOB Nov 02, 1950, MRN [id number]   PCP:  Rochel Brome, MD   Evaluation Performed: telephone call  Chief Complaint:  Sore throat and cough-no temp  History of Present Illness:    Jon [name] is a 69 y.o. male with sore throat which has been present or 2.5 days, chloraseptic spray helps some. Has a dry cough which is worse during the day vs at night. No fever. Recent amputation -toe-improving. Pt states he had a recent COVID prior to surgery and both vaccines but agreed to COVID testing then office visit tomorrow. Pt eating with no difficulty, smells food, tastes food, mild headache.   The patient  have symptoms concerning for COVID-19 infection cough    Past Medical History:  Diagnosis Date  . Abnormal EKG 11/29/2014  . Acute renal insufficiency 11/29/2014  . Angina   . Arthritis    "knees" (01/18/2014)  . Asthma   . Atrial fibrillation ([place])   . Back pain 01/25/2015  . 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 BOOP (bronchiolitis obliterans with organizing pneumonia) ([place]) 01/20/2014   OLBx 01/20/14 BOOP Started steroids 02/28/14   . CHF (congestive heart failure) ([place])   . Chronic  anticoagulation-Xarelto 02/09/2014  . Chronic atrial fibrillation ([place]) 01/03/2014  . Chronic bronchitis ([place]) 12/21/2013  . Chronic diastolic heart failure ([place]) 02/22/2015  . Chronic pain of both knees 03/18/2017   Overview:  Added automatically from request for surgery [id number]  . Chronic respiratory failure ([place]) 04/10/2015  . COPD (chronic obstructive pulmonary disease) ([place]) 01/25/2015  . Cough    coughing up blood- started last nite, seems to be worse now  . CVA (cerebral vascular accident) ([place]) 02/09/2014  . Dysrhythmia    atrial fib takes cardizem,   . Fibromyalgia   . GERD (gastroesophageal reflux disease)   . H/O hiatal hernia   . Hemoptysis 08/01/2015  . Herpes zoster 06/26/2015  . History of stroke   . Hyperkalemia-repeat pending 11/29/2014  . Hyperlipemia   . Hyperlipidemia   . Hypertension   . Hypothyroidism   . Long term (current) use of anticoagulants 03/21/2014  . Mild CAD 02/20/2015   Overview:  On recent cardiac cath  Overview:  On recent cardiac cath  . Myocardial infarction [facility])    "one dr says yes; another says no"  . Neuromuscular disorder (HCC)    rls, neuropathy  . Neuropathy    rls, neuropathy   . Normal coronary arteries 2011 11/29/2014  . Obesity (BMI 30-39.9)   . On home oxygen therapy    "2L w/CPAP at bedtime" (09/01/2018)  . OSA on CPAP    cpap & oxygen  . Pericardial effusion   . Peripheral vascular disease ([place])   .  Peritoneal effusion, chronic 02/22/2015   Overview:  With surgical window  . PNA (pneumonia) 04/10/2015  . Pneumonia 5/15   "several times" (01/18/2014)  . Precordial pain 01/26/2015  . Preoperative cardiovascular examination 02/24/2016  . Primary osteoarthritis of both knees 03/18/2017   Overview:  Added automatically from request for surgery [id number]  . Restless leg syndrome   . Sleep apnea    cpap & oxygen   . Stroke [facility]) 2012   denies residual on 01/18/2014  . Troponin level elevated 11/29/2014  . Uncontrolled type 2 diabetes mellitus  with diabetic polyneuropathy, with long-term current use of insulin ([place]) 12/21/2013   type ?     Past Surgical History:  Procedure Laterality Date  . ABDOMINAL AORTOGRAM W/LOWER EXTREMITY N/A 09/08/2018   Procedure: ABDOMINAL AORTOGRAM W/LOWER EXTREMITY;  Surgeon: Marty Heck, MD;  Location: [place] [facility];  Service: Cardiovascular;  Laterality: N/A;  . AMPUTATION Right 09/09/2018   Procedure: AMPUTATION RIGHT GREAT TOE;  Surgeon: Waynetta Sandy, MD;  Location: [place];  Service: Vascular;  Laterality: Right;  . AMPUTATION TOE Right 05/04/2020   Procedure: AMPUTATION TOE RIGHT 2nd TOE;  Surgeon: Evelina Bucy, DPM;  Location: [facility];  Service: Podiatry;  Laterality: Right;  . APPENDECTOMY    . CARDIAC CATHETERIZATION  X 2 then 03/03/2012    NL LVF, normal coronaries, vessels are small ([facility]: Dr. Beatrix Fetters)  . CATARACT EXTRACTION W/ INTRAOCULAR LENS  IMPLANT, BILATERAL Bilateral   . CHOLECYSTECTOMY    . HERNIA REPAIR     UHR  . LAPAROSCOPIC GASTRIC BANDING  2010  . LOWER EXTREMITY ANGIOGRAPHY Left 10/11/2018   Procedure: LOWER EXTREMITY ANGIOGRAPHY;  Surgeon: Waynetta Sandy, MD;  Location: [place] [facility];  Service: Cardiovascular;  Laterality: Left;  . PERICARDIAL WINDOW Left 01/24/2014   Procedure: PERICARDIAL WINDOW;  Surgeon: Gaye Pollack, MD;  Location: [place];  Service: Thoracic;  Laterality: Left;  . PERIPHERAL VASCULAR INTERVENTION Right 09/08/2018   Procedure: PERIPHERAL VASCULAR INTERVENTION;  Surgeon: Marty Heck, MD;  Location: [place] [facility];  Service: Cardiovascular;  Laterality: Right;  . SINUS EXPLORATION  X 2  . TEE WITHOUT CARDIOVERSION N/A 09/07/2018   Procedure: TRANSESOPHAGEAL ECHOCARDIOGRAM (TEE);  Surgeon: Acie Fredrickson Wonda Cheng, MD;  Location: [place];  Service: Cardiovascular;  Laterality: N/A;  . UMBILICAL HERNIA REPAIR    . VIDEO ASSISTED THORACOSCOPY Left 01/24/2014   Procedure: VIDEO ASSISTED THORACOSCOPY;  Surgeon: Gaye Pollack, MD;  [facility];  Service: Thoracic;  Laterality: Left;  VATS/open lung biopsy  . VIDEO BRONCHOSCOPY Bilateral 12/29/2013   Procedure: VIDEO BRONCHOSCOPY WITHOUT FLUORO;  Surgeon: Elsie Stain, MD;  Location: [facility];  Service: Endoscopy;  Laterality: Bilateral;  . VIDEO BRONCHOSCOPY N/A 01/24/2014   Procedure: VIDEO BRONCHOSCOPY;  Surgeon: Gaye Pollack, MD;  Location: [facility] OR;  Service: Thoracic;  Laterality: N/A;    Family History  Problem Relation Age of Onset  . Cancer Mother   . Stroke Father   . Heart disease Father   . Seizures Sister   . Diabetes Sister   . Anesthesia problems Neg Hx   . Hypotension Neg Hx   . Malignant hyperthermia Neg Hx   . Pseudochol deficiency Neg Hx     Social History   Socioeconomic History  . Marital status: Widowed    Spouse name: Not on file  . Number of children: 3  . Years of education: Not on file  . Highest education  level: Not on file  Occupational History  . Not on file  Tobacco Use  . Smoking status: Former Smoker    Types: Cigars    Quit date: 09/01/2006    Years since quitting: 13.7  . Smokeless tobacco: Never Used  Vaping Use  . Vaping Use: Never used  Substance and Sexual Activity  . 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 Drug use: No  . Sexual activity: Never  Other Topics Concern  . Not on file  Social History Narrative  . Not on file   Social Determinants of Health   Financial Resource Strain:   . Difficulty of Paying Living Expenses: Not on file  Food Insecurity:   . Worried About Programme researcher, broadcasting/film/video in the Last Year: Not on file  . Ran Out of Food in the Last Year: Not on file  Transportation Needs: No Transportation Needs  . Lack of Transportation (Medical): No  . Lack of Transportation (Non-Medical): No  Physical Activity: Inactive  . Days of Exercise per Week: 0 days  . Minutes of Exercise per Session: 0 min  Stress:   . Feeling of Stress : Not on  file  Social Connections:   . Frequency of Communication with Friends and Family: Not on file  . Frequency of Social Gatherings with Friends and Family: Not on file  . Attends Religious Services: Not on file  . Active Member of Clubs or Organizations: Not on file  . Attends Banker Meetings: Not on file  . Marital Status: Not on file  Intimate Partner Violence:   . Fear of Current or Ex-Partner: Not on file  . Emotionally Abused: Not on file  . Physically Abused: Not on file  . Sexually Abused: Not on file    Outpatient Medications Prior to Visit  Medication Sig Dispense Refill  . albuterol (PROVENTIL) (2.5 MG/3ML) 0.083% nebulizer solution Take 3 mLs (2.5 mg total) by nebulization 2 (two) times daily. And as needed (Patient taking differently: Take 2.5 mg by nebulization 2 (two) times daily as needed for wheezing. ) 75 mL 5  . atorvastatin (LIPITOR) 40 MG tablet Take 40 mg by mouth daily.    . Azelastine HCl 0.15 % SOLN Place 2 sprays into both nostrils daily.     . BD PEN NEEDLE NANO U/F 32G X 4 MM MISC     . clindamycin (CLEOCIN) 300 MG capsule TAKE 1 CAPSULE BY MOUTH 2 TIMES DAILY 14 capsule 0  . clopidogrel (PLAVIX) 75 MG tablet Take 1 tablet (75 mg total) by mouth daily. 30 tablet 5  . diltiazem (CARDIZEM CD) 120 MG 24 hr capsule TAKE 1 CAPSULE BY MOUTH DAILY 90 capsule 1  . docusate sodium (COLACE) 100 MG capsule Take 100 mg by mouth 2 (two) times daily.    Marland Kitchen doxycycline (VIBRA-TABS) 100 MG tablet Take 1 tablet (100 mg total) by mouth 2 (two) times daily. (Patient not taking: Reported on 05/09/2020) 20 tablet 0  . ferrous sulfate 325 (65 FE) MG tablet Take 325 mg by mouth daily.    . Glucagon, rDNA, (GLUCAGON EMERGENCY) 1 MG KIT Inject 1 mg as directed as needed (low blood sugar).     . hydrALAZINE (APRESOLINE) 100 MG tablet Take 50 mg by mouth 2 (two) times daily.    . insulin detemir (LEVEMIR FLEXTOUCH) 100 UNIT/ML FlexPen Inject 26 Units into the skin daily. (Patient  taking differently: Inject 30  Units into the skin daily. ) 15 mL 11  . isosorbide mononitrate (IMDUR) 120 MG 24 hr tablet TAKE 1 TABLET BY MOUTH ONCE DAILY IN THEMORNINGS 90 tablet 1  . Lactulose 20 GM/30ML SOLN Take 30 mLs (20 g total) by mouth in the morning and at bedtime. 946 mL 2  . metolazone (ZAROXOLYN) 2.5 MG tablet TAKE 1 TABLET BY MOUTH DAILY 3 HOURS PRIOR TO LASIX DAILY (Patient taking differently: Take 2.5 mg by mouth daily. 3 hours prior to torsemide) 36 tablet 3  . mometasone-formoterol (DULERA) 200-5 MCG/ACT AERO Inhale 2 puffs into the lungs 2 (two) times daily as needed for wheezing.    . nitroGLYCERIN (NITROSTAT) 0.4 MG SL tablet Place 0.4 mg under the tongue every 5 (five) minutes as needed for chest pain.     Marland Kitchen oxyCODONE-acetaminophen (PERCOCET/ROXICET) 5-325 MG tablet TAKE 1 TABLET BY MOUTH EVERY 4 HOURS AS NEEDED FOR SEVERE PAIN 20 tablet 0  . OXYGEN Inhale into the lungs. 2 liters at bedtime    . pantoprazole (PROTONIX) 40 MG tablet Take 40 mg by mouth in the morning.     . Potassium Chloride ER 20 MEQ TBCR Take 40 mEq by mouth 2 (two) times daily.     . pregabalin (LYRICA) 75 MG capsule TAKE 1 CAPSULE BY MOUTH TWICE DAILY (Patient taking differently: Take 75 mg by mouth 2 (two) times daily. ) 180 capsule 2  . RELISTOR 150 MG TABS TAKE 3 TABLETS BY MOUTH EVERY MORNING 90 tablet 0  . Semaglutide, 1 MG/DOSE, (OZEMPIC, 1 MG/DOSE,) 2 MG/1.5ML SOPN Inject 1 mg into the skin once a week. 3 mL 2  . torsemide (DEMADEX) 20 MG tablet Take 3 tablets (60 mg total) by mouth 2 (two) times daily. 180 tablet 0  . Vitamin D, Ergocalciferol, (DRISDOL) 1.25 MG (50000 UNIT) CAPS capsule TAKE 1 CAPSULE BY MOUTH DAILY 90 capsule 1  . XARELTO 15 MG TABS tablet TAKE 1 TABLET BY MOUTH ONCE DAILY (Patient taking differently: Take 15 mg by mouth at bedtime. ) 90 tablet 2  . XTAMPZA ER 27 MG C12A Take 27 mg by mouth 2 (two) times daily. 60 capsule 0   No facility-administered medications prior to visit.    .med Allergies:   Hydrocodone, Morphine, Duloxetine hcl, Codeine, and Penicillins   Social History   Tobacco Use  . Smoking status: Former Smoker    Types: Cigars    Quit date: 09/01/2006    Years since quitting: 13.7  . Smokeless tobacco: Never Used  Vaping Use  . Vaping Use: Never used  Substance Use Topics  . Alcohol use: No    Comment: "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; quit in [id number]"  . Drug use: No     Review of Systems  Constitutional: Negative for chills and fever.  HENT: Positive for sore throat. Negative for congestion, ear pain and sinus pain.   Respiratory: Positive for cough (Dry) and shortness of breath.   Cardiovascular: Negative for PND.  Gastrointestinal: Positive for diarrhea. Negative for nausea and vomiting.  Neurological: Negative for headaches.     Labs/Other Tests and Data Reviewed:    Recent Labs: 03/27/2020: ALT 9; TSH 1.560 05/01/2020: BUN 50; Creatinine, Ser 2.50; Hemoglobin 13.4; Platelets 309; Potassium 3.3; Sodium 143   Recent Lipid Panel Lab Results  Component Value Date/Time   CHOL 117 12/13/2019 07:51 AM   TRIG 124 12/13/2019 07:51 AM   HDL 40 12/13/2019 07:51 AM   CHOLHDL 2.9 12/13/2019 07:51 AM  CHOLHDL 2.9 01/31/2014 03:37 AM   LDLCALC 55 12/13/2019 07:51 AM    Wt Readings from Last 3 Encounters:  05/04/20 251 lb 5.2 oz (114 kg)  05/02/20 251 lb 6.4 oz (114 kg)  04/25/20 250 lb (113.4 kg)     Objective:    Vital Signs:  Pt does not have a thermometer   Physical Exam  Phone visit  ASSESSMENT & PLAN:   1. Sore throat-salt water gargle, soft foods to avoid choking-no fever.   2. Cough Pt needs COVID testing-vaccine x 2-recent amputation, home environment with multiple people in small living space per pt. Unknown if any acute illness  COVID-19 Education: The signs and symptoms of COVID-19 were discussed -appt tomorrow COVID then clinical visit if negative  Time:   Today, I have spent 7 minutes with the patient with  telehealth technology discussing the above problems.    Follow Up: Lelon Huh MD 06/06/2020 1:38 PM    [place]

## 2020-06-07 ENCOUNTER — Other Ambulatory Visit: Payer: Self-pay

## 2020-06-07 ENCOUNTER — Encounter: Payer: Self-pay | Admitting: Family Medicine

## 2020-06-07 ENCOUNTER — Ambulatory Visit (INDEPENDENT_AMBULATORY_CARE_PROVIDER_SITE_OTHER): Payer: Medicare Other | Admitting: Family Medicine

## 2020-06-07 VITALS — BP 118/68 | HR 55 | Temp 98.2°F | Ht 67.0 in | Wt 251.0 lb

## 2020-06-07 DIAGNOSIS — H34832 Tributary (branch) retinal vein occlusion, left eye, with macular edema: Secondary | ICD-10-CM | POA: Diagnosis not present

## 2020-06-07 DIAGNOSIS — H401132 Primary open-angle glaucoma, bilateral, moderate stage: Secondary | ICD-10-CM | POA: Diagnosis not present

## 2020-06-07 DIAGNOSIS — R059 Cough, unspecified: Secondary | ICD-10-CM | POA: Diagnosis not present

## 2020-06-07 DIAGNOSIS — J029 Acute pharyngitis, unspecified: Secondary | ICD-10-CM | POA: Diagnosis not present

## 2020-06-07 LAB — POCT RAPID STREP A (OFFICE): Rapid Strep A Screen: NEGATIVE

## 2020-06-07 MED ORDER — AZITHROMYCIN 250 MG PO TABS: ORAL_TABLET | ORAL | 0 refills | Status: DC

## 2020-06-07 MED ORDER — BENZONATATE 200 MG PO CAPS: 200.0000 mg | ORAL_CAPSULE | Freq: Three times a day (TID) | ORAL | 0 refills | Status: DC | PRN

## 2020-06-07 NOTE — Progress Notes (Signed)
Acute Office Visit  Subjective:    Patient ID: Jon [name], male    DOB: [birth year], 69 y.o.   MRN: [id number]  Chief Complaint  Patient presents with  . Sore Throat    HPI Patient is in today for cough and s/t-strep neg/COVID neg No fever. Takes benadryl for cough. Concern for drainage. Recent amputation.   Past Medical History:  Diagnosis Date  . Abnormal EKG 11/29/2014  . Acute renal insufficiency 11/29/2014  . Angina   . Arthritis    "knees" (01/18/2014)  . Asthma   . Atrial fibrillation ([place])   . Back pain 01/25/2015  . 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 BOOP (bronchiolitis obliterans with organizing pneumonia) ([place]) 01/20/2014   OLBx 01/20/14 BOOP Started steroids 02/28/14   . CHF (congestive heart failure) ([place])   . Chronic anticoagulation-Xarelto 02/09/2014  . Chronic atrial fibrillation ([place]) 01/03/2014  . Chronic bronchitis ([place]) 12/21/2013  . Chronic diastolic heart failure ([place]) 02/22/2015  . Chronic pain of both knees 03/18/2017   Overview:  Added automatically from request for surgery [id number]  . Chronic respiratory failure ([place]) 04/10/2015  . COPD (chronic obstructive pulmonary disease) ([place]) 01/25/2015  . Cough    coughing up blood- started last nite, seems to be worse now  . CVA (cerebral vascular accident) ([place]) 02/09/2014  . Dysrhythmia    atrial fib takes cardizem,   . Fibromyalgia   . GERD (gastroesophageal reflux disease)   . H/O hiatal hernia   . Hemoptysis 08/01/2015  . Herpes zoster 06/26/2015  . History of stroke   . Hyperkalemia-repeat pending 11/29/2014  . Hyperlipemia   . Hyperlipidemia   . Hypertension   . Hypothyroidism   . Long term (current) use of anticoagulants 03/21/2014  . Mild CAD 02/20/2015   Overview:  On recent cardiac cath  Overview:  On recent cardiac cath  . Myocardial infarction [facility])    "one dr says yes; another says no"  . Neuromuscular disorder (HCC)    rls, neuropathy  . Neuropathy    rls, neuropathy   . Normal coronary  arteries 2011 11/29/2014  . Obesity (BMI 30-39.9)   . On home oxygen therapy    "2L w/CPAP at bedtime" (09/01/2018)  . OSA on CPAP    cpap & oxygen  . Pericardial effusion   . Peripheral vascular disease ([place])   . Peritoneal effusion, chronic 02/22/2015   Overview:  With surgical window  . PNA (pneumonia) 04/10/2015  . Pneumonia 5/15   "several times" (01/18/2014)  . Precordial pain 01/26/2015  . Preoperative cardiovascular examination 02/24/2016  . Primary osteoarthritis of both knees 03/18/2017   Overview:  Added automatically from request for surgery [id number]  . Restless leg syndrome   . Sleep apnea    cpap & oxygen   . Stroke [facility]) 2012   denies residual on 01/18/2014  . Troponin level elevated 11/29/2014  . Uncontrolled type 2 diabetes mellitus with diabetic polyneuropathy, with long-term current use of insulin ([place]) 12/21/2013   type ?     Past Surgical History:  Procedure Laterality Date  . ABDOMINAL AORTOGRAM W/LOWER EXTREMITY N/A 09/08/2018   Procedure: ABDOMINAL AORTOGRAM W/LOWER EXTREMITY;  Surgeon: Marty Heck, MD;  Location: [place] [facility];  Service: Cardiovascular;  Laterality: N/A;  . AMPUTATION Right 09/09/2018   Procedure: AMPUTATION RIGHT GREAT TOE;  Surgeon: Waynetta Sandy, MD;  Location: [place];  Service: Vascular;  Laterality: Right;  . AMPUTATION TOE Right 05/04/2020   Procedure: AMPUTATION  TOE RIGHT 2nd TOE;  Surgeon: Evelina Bucy, DPM;  Location: [facility];  Service: Podiatry;  Laterality: Right;  . APPENDECTOMY    . CARDIAC CATHETERIZATION  X 2 then 03/03/2012    NL LVF, normal coronaries, vessels are small ([facility]: Dr. Beatrix Fetters)  . CATARACT EXTRACTION W/ INTRAOCULAR LENS  IMPLANT, BILATERAL Bilateral   . CHOLECYSTECTOMY    . HERNIA REPAIR     UHR  . LAPAROSCOPIC GASTRIC BANDING  2010  . LOWER EXTREMITY ANGIOGRAPHY Left 10/11/2018   Procedure: LOWER EXTREMITY ANGIOGRAPHY;  Surgeon: Waynetta Sandy, MD;  Location: [place] [facility];   Service: Cardiovascular;  Laterality: Left;  . PERICARDIAL WINDOW Left 01/24/2014   Procedure: PERICARDIAL WINDOW;  Surgeon: Gaye Pollack, MD;  Location: [place];  Service: Thoracic;  Laterality: Left;  . PERIPHERAL VASCULAR INTERVENTION Right 09/08/2018   Procedure: PERIPHERAL VASCULAR INTERVENTION;  Surgeon: Marty Heck, MD;  Location: [place] [facility];  Service: Cardiovascular;  Laterality: Right;  . SINUS EXPLORATION  X 2  . TEE WITHOUT CARDIOVERSION N/A 09/07/2018   Procedure: TRANSESOPHAGEAL ECHOCARDIOGRAM (TEE);  Surgeon: Acie Fredrickson Wonda Cheng, MD;  Location: [place];  Service: Cardiovascular;  Laterality: N/A;  . UMBILICAL HERNIA REPAIR    . VIDEO ASSISTED THORACOSCOPY Left 01/24/2014   Procedure: VIDEO ASSISTED THORACOSCOPY;  Surgeon: Gaye Pollack, MD;  Location: [facility] OR;  Service: Thoracic;  Laterality: Left;  VATS/open lung biopsy  . VIDEO BRONCHOSCOPY Bilateral 12/29/2013   Procedure: VIDEO BRONCHOSCOPY WITHOUT FLUORO;  Surgeon: Elsie Stain, MD;  Location[facility];  Service: Endoscopy;  Laterality: Bilateral;  . VIDEO BRONCHOSCOPY N/A 01/24/2014   Procedure: VIDEO BRONCHOSCOPY;  Surgeon: Gaye Pollack, MD;  Location: [facility] OR;  Service: Thoracic;  Laterality: N/A;    Family History  Problem Relation Age of Onset  . Cancer Mother   . Stroke Father   . Heart disease Father   . Seizures Sister   . Diabetes Sister   . Anesthesia problems Neg Hx   . Hypotension Neg Hx   . Malignant hyperthermia Neg Hx   . Pseudochol deficiency Neg Hx     Social History   Socioeconomic History  . Marital status: Widowed    Spouse name: Not on file  . Number of children: 3  . Years of education: Not on file  . Highest education level: Not on file  Occupational History  . Not on file  Tobacco Use  . Smoking status: Former Smoker    Types: Cigars    Quit date: 09/01/2006    Years since quitting: 13.7  . Smokeless tobacco: Never Used  Vaping Use  . Vaping Use: Never used    Substance and Sexual Activity  . 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 Drug use: No  . Sexual activity: Never  Other Topics Concern  . Not on file  Social History Narrative  . Not on file   Social Determinants of Health   Financial Resource Strain:   . Difficulty of Paying Living Expenses: Not on file  Food Insecurity:   . Worried About Charity fundraiser in the Last Year: Not on file  . Ran Out of Food in the Last Year: Not on file  Transportation Needs: No Transportation Needs  . Lack of Transportation (Medical): No  . Lack of Transportation (Non-Medical): No  Physical Activity: Inactive  . Days of Exercise per Week: 0 days  .  Minutes of Exercise per Session: 0 min  Stress:   . Feeling of Stress : Not on file  Social Connections:   . Frequency of Communication with Friends and Family: Not on file  . Frequency of Social Gatherings with Friends and Family: Not on file  . Attends Religious Services: Not on file  . Active Member of Clubs or Organizations: Not on file  . Attends Archivist Meetings: Not on file  . Marital Status: Not on file  Intimate Partner Violence:   . Fear of Current or Ex-Partner: Not on file  . Emotionally Abused: Not on file  . Physically Abused: Not on file  . Sexually Abused: Not on file    Outpatient Medications Prior to Visit  Medication Sig Dispense Refill  . albuterol (PROVENTIL) (2.5 MG/3ML) 0.083% nebulizer solution Take 3 mLs (2.5 mg total) by nebulization 2 (two) times daily. And as needed (Patient taking differently: Take 2.5 mg by nebulization 2 (two) times daily as needed for wheezing. ) 75 mL 5  . atorvastatin (LIPITOR) 40 MG tablet Take 40 mg by mouth daily.    . Azelastine HCl 0.15 % SOLN Place 2 sprays into both nostrils daily.     . BD PEN NEEDLE NANO U/F 32G X 4 MM MISC     . clopidogrel (PLAVIX) 75 MG tablet Take 1 tablet (75 mg total) by mouth daily. 30 tablet 5  . diltiazem  (CARDIZEM CD) 120 MG 24 hr capsule TAKE 1 CAPSULE BY MOUTH DAILY 90 capsule 1  . docusate sodium (COLACE) 100 MG capsule Take 100 mg by mouth 2 (two) times daily.    . ferrous sulfate 325 (65 FE) MG tablet Take 325 mg by mouth daily.    . Glucagon, rDNA, (GLUCAGON EMERGENCY) 1 MG KIT Inject 1 mg as directed as needed (low blood sugar).     . hydrALAZINE (APRESOLINE) 100 MG tablet Take 50 mg by mouth 2 (two) times daily.    . insulin detemir (LEVEMIR FLEXTOUCH) 100 UNIT/ML FlexPen Inject 26 Units into the skin daily. (Patient taking differently: Inject 30 Units into the skin daily. ) 15 mL 11  . isosorbide mononitrate (IMDUR) 120 MG 24 hr tablet TAKE 1 TABLET BY MOUTH ONCE DAILY IN THEMORNINGS 90 tablet 1  . lactulose (CHRONULAC) 10 GM/15ML solution TAKE 30MLS BY MOUTH IN THE MORNING AND AT BEDTIME 946 mL 2  . metolazone (ZAROXOLYN) 2.5 MG tablet TAKE 1 TABLET BY MOUTH DAILY 3 HOURS PRIOR TO LASIX DAILY (Patient taking differently: Take 2.5 mg by mouth daily. 3 hours prior to torsemide) 36 tablet 3  . mometasone-formoterol (DULERA) 200-5 MCG/ACT AERO Inhale 2 puffs into the lungs 2 (two) times daily as needed for wheezing.    . nitroGLYCERIN (NITROSTAT) 0.4 MG SL tablet Place 0.4 mg under the tongue every 5 (five) minutes as needed for chest pain.     Marland Kitchen oxyCODONE-acetaminophen (PERCOCET/ROXICET) 5-325 MG tablet TAKE 1 TABLET BY MOUTH EVERY 4 HOURS AS NEEDED FOR SEVERE PAIN 20 tablet 0  . OXYGEN Inhale into the lungs. 2 liters at bedtime    . pantoprazole (PROTONIX) 40 MG tablet Take 40 mg by mouth in the morning.     . Potassium Chloride ER 20 MEQ TBCR Take 40 mEq by mouth 2 (two) times daily.     . pregabalin (LYRICA) 75 MG capsule TAKE 1 CAPSULE BY MOUTH TWICE DAILY (Patient taking differently: Take 75 mg by mouth 2 (two) times daily. ) 180 capsule 2  .  RELISTOR 150 MG TABS TAKE 3 TABLETS BY MOUTH EVERY MORNING 90 tablet 0  . Semaglutide, 1 MG/DOSE, (OZEMPIC, 1 MG/DOSE,) 2 MG/1.5ML SOPN Inject 1 mg  into the skin once a week. 3 mL 2  . torsemide (DEMADEX) 20 MG tablet Take 3 tablets (60 mg total) by mouth 2 (two) times daily. 180 tablet 0  . Vitamin D, Ergocalciferol, (DRISDOL) 1.25 MG (50000 UNIT) CAPS capsule TAKE 1 CAPSULE BY MOUTH DAILY 90 capsule 1  . XARELTO 15 MG TABS tablet TAKE 1 TABLET BY MOUTH ONCE DAILY (Patient taking differently: Take 15 mg by mouth at bedtime. ) 90 tablet 2  . XTAMPZA ER 27 MG C12A Take 27 mg by mouth 2 (two) times daily. 60 capsule 0  . clindamycin (CLEOCIN) 300 MG capsule TAKE 1 CAPSULE BY MOUTH 2 TIMES DAILY 14 capsule 0  . doxycycline (VIBRA-TABS) 100 MG tablet Take 1 tablet (100 mg total) by mouth 2 (two) times daily. (Patient not taking: Reported on 05/09/2020) 20 tablet 0   No facility-administered medications prior to visit.    Allergies  Allergen Reactions  . Hydrocodone Itching  . Morphine Itching  . Duloxetine Hcl Other (See Comments)    Unknown reaction  . Codeine Itching  . Penicillins Itching and Rash    DID THE REACTION INVOLVE: Swelling of the face/tongue/throat, SOB, or low BP? No Sudden or severe rash/hives, skin peeling, or the inside of the mouth or nose? No Did it require medical treatment? No When did it last happen?unknown If all above answers are "NO", may proceed with cephalosporin use.     Review of Systems  Constitutional: Negative for fatigue and fever.  Eyes: Negative.   Respiratory: Positive for cough and shortness of breath. Negative for wheezing.   Cardiovascular: Negative.   Gastrointestinal: Negative for diarrhea and nausea.  Allergic/Immunologic: Negative for environmental allergies.       Objective:    Physical Exam Constitutional:      Appearance: He is well-developed. He is obese.  HENT:     Right Ear: Tympanic membrane and ear canal normal.     Left Ear: Tympanic membrane and ear canal normal.     Nose: Congestion present.     Mouth/Throat:     Mouth: Mucous membranes are moist.      Pharynx: No pharyngeal swelling.     Tonsils: No tonsillar exudate.  Eyes:     Conjunctiva/sclera: Conjunctivae normal.  Cardiovascular:     Rate and Rhythm: Normal rate and regular rhythm.     Heart sounds: Normal heart sounds.  Pulmonary:     Effort: Pulmonary effort is normal.     Breath sounds: Normal breath sounds.  Musculoskeletal:     Cervical back: Normal range of motion and neck supple.  Skin:    Comments: No eryth or drainage noted at amputation site     BP 118/68 (BP Location: Right Arm, Patient Position: Sitting)   Pulse (!) 55   Temp 98.2 F (36.8 C) (Temporal)   Ht _0  (1.702 m)   Wt 251 lb (113.9 kg)   SpO2 95%   BMI 39.31 kg/m  Wt Readings from Last 3 Encounters:  06/07/20 251 lb (113.9 kg)  06/06/20 266 lb (120.7 kg)  05/04/20 251 lb 5.2 oz (114 kg)    Health Maintenance Due  Topic Date Due  . Hepatitis C Screening  Never done  . FOOT EXAM  02/28/2016  . OPHTHALMOLOGY EXAM  05/30/2020    There  are no preventive care reminders to display for this patient.   Lab Results  Component Value Date   TSH 1.560 03/27/2020   Lab Results  Component Value Date   WBC 11.9 (H) 05/01/2020   HGB 13.4 05/01/2020   HCT 40.5 05/01/2020   MCV 87.1 05/01/2020   PLT 309 05/01/2020   Lab Results  Component Value Date   NA 143 05/01/2020   K 3.3 (L) 05/01/2020   CO2 29 05/01/2020   GLUCOSE 132 (H) 05/01/2020   BUN 50 (H) 05/01/2020   CREATININE 2.50 (H) 05/01/2020   BILITOT 0.7 03/27/2020   ALKPHOS 142 (H) 03/27/2020   AST 11 03/27/2020   ALT 9 03/27/2020   PROT 6.8 03/27/2020   ALBUMIN 3.6 (L) 03/27/2020   CALCIUM 9.1 05/01/2020   ANIONGAP 13 05/01/2020   Lab Results  Component Value Date   CHOL 117 12/13/2019   Lab Results  Component Value Date   HDL 40 12/13/2019   Lab Results  Component Value Date   LDLCALC 55 12/13/2019   Lab Results  Component Value Date   TRIG 124 12/13/2019   Lab Results  Component Value Date   CHOLHDL 2.9  12/13/2019   Lab Results  Component Value Date   HGBA1C 8.8 (H) 03/27/2020       Assessment & Plan:   Problem List Items Addressed This Visit    None    Visit Diagnoses    Acute sore throat    -  Primary   Relevant Orders   POCT rapid strep A (Completed)     1. Acute sore throat Neg strep - POCT rapid strep A  2. Cough Zpack, tessalon, cxr-recent exposure while completing amputation-COVID neg - DG Chest 2 View; Future  Skylor Schnapp Hannah Beat, MD

## 2020-06-08 DIAGNOSIS — Z9181 History of falling: Secondary | ICD-10-CM | POA: Diagnosis not present

## 2020-06-08 DIAGNOSIS — K219 Gastro-esophageal reflux disease without esophagitis: Secondary | ICD-10-CM | POA: Diagnosis not present

## 2020-06-08 DIAGNOSIS — E785 Hyperlipidemia, unspecified: Secondary | ICD-10-CM | POA: Diagnosis not present

## 2020-06-08 DIAGNOSIS — M170 Bilateral primary osteoarthritis of knee: Secondary | ICD-10-CM | POA: Diagnosis not present

## 2020-06-08 DIAGNOSIS — I482 Chronic atrial fibrillation, unspecified: Secondary | ICD-10-CM | POA: Diagnosis not present

## 2020-06-08 DIAGNOSIS — I824Z2 Acute embolism and thrombosis of unspecified deep veins of left distal lower extremity: Secondary | ICD-10-CM | POA: Diagnosis not present

## 2020-06-08 DIAGNOSIS — N183 Chronic kidney disease, stage 3 unspecified: Secondary | ICD-10-CM | POA: Diagnosis not present

## 2020-06-08 DIAGNOSIS — I252 Old myocardial infarction: Secondary | ICD-10-CM | POA: Diagnosis not present

## 2020-06-08 DIAGNOSIS — E1142 Type 2 diabetes mellitus with diabetic polyneuropathy: Secondary | ICD-10-CM | POA: Diagnosis not present

## 2020-06-08 DIAGNOSIS — Z9981 Dependence on supplemental oxygen: Secondary | ICD-10-CM | POA: Diagnosis not present

## 2020-06-08 DIAGNOSIS — I872 Venous insufficiency (chronic) (peripheral): Secondary | ICD-10-CM | POA: Diagnosis not present

## 2020-06-08 DIAGNOSIS — M797 Fibromyalgia: Secondary | ICD-10-CM | POA: Diagnosis not present

## 2020-06-08 DIAGNOSIS — G2581 Restless legs syndrome: Secondary | ICD-10-CM | POA: Diagnosis not present

## 2020-06-08 DIAGNOSIS — I5032 Chronic diastolic (congestive) heart failure: Secondary | ICD-10-CM | POA: Diagnosis not present

## 2020-06-08 DIAGNOSIS — J9620 Acute and chronic respiratory failure, unspecified whether with hypoxia or hypercapnia: Secondary | ICD-10-CM | POA: Diagnosis not present

## 2020-06-08 DIAGNOSIS — E1151 Type 2 diabetes mellitus with diabetic peripheral angiopathy without gangrene: Secondary | ICD-10-CM | POA: Diagnosis not present

## 2020-06-08 DIAGNOSIS — J029 Acute pharyngitis, unspecified: Secondary | ICD-10-CM | POA: Insufficient documentation

## 2020-06-08 DIAGNOSIS — R059 Cough, unspecified: Secondary | ICD-10-CM | POA: Insufficient documentation

## 2020-06-08 DIAGNOSIS — E1122 Type 2 diabetes mellitus with diabetic chronic kidney disease: Secondary | ICD-10-CM | POA: Diagnosis not present

## 2020-06-08 DIAGNOSIS — E039 Hypothyroidism, unspecified: Secondary | ICD-10-CM | POA: Diagnosis not present

## 2020-06-08 DIAGNOSIS — I130 Hypertensive heart and chronic kidney disease with heart failure and stage 1 through stage 4 chronic kidney disease, or unspecified chronic kidney disease: Secondary | ICD-10-CM | POA: Diagnosis not present

## 2020-06-08 DIAGNOSIS — G4733 Obstructive sleep apnea (adult) (pediatric): Secondary | ICD-10-CM | POA: Diagnosis not present

## 2020-06-08 DIAGNOSIS — J449 Chronic obstructive pulmonary disease, unspecified: Secondary | ICD-10-CM | POA: Diagnosis not present

## 2020-06-08 DIAGNOSIS — L97811 Non-pressure chronic ulcer of other part of right lower leg limited to breakdown of skin: Secondary | ICD-10-CM | POA: Diagnosis not present

## 2020-06-08 DIAGNOSIS — I25119 Atherosclerotic heart disease of native coronary artery with unspecified angina pectoris: Secondary | ICD-10-CM | POA: Diagnosis not present

## 2020-06-08 DIAGNOSIS — Z8673 Personal history of transient ischemic attack (TIA), and cerebral infarction without residual deficits: Secondary | ICD-10-CM | POA: Diagnosis not present

## 2020-06-11 ENCOUNTER — Other Ambulatory Visit: Payer: Self-pay | Admitting: Family Medicine

## 2020-06-12 DIAGNOSIS — K219 Gastro-esophageal reflux disease without esophagitis: Secondary | ICD-10-CM | POA: Diagnosis not present

## 2020-06-12 DIAGNOSIS — I5032 Chronic diastolic (congestive) heart failure: Secondary | ICD-10-CM | POA: Diagnosis not present

## 2020-06-12 DIAGNOSIS — E785 Hyperlipidemia, unspecified: Secondary | ICD-10-CM | POA: Diagnosis not present

## 2020-06-12 DIAGNOSIS — E1122 Type 2 diabetes mellitus with diabetic chronic kidney disease: Secondary | ICD-10-CM | POA: Diagnosis not present

## 2020-06-12 DIAGNOSIS — M797 Fibromyalgia: Secondary | ICD-10-CM | POA: Diagnosis not present

## 2020-06-12 DIAGNOSIS — J449 Chronic obstructive pulmonary disease, unspecified: Secondary | ICD-10-CM | POA: Diagnosis not present

## 2020-06-12 DIAGNOSIS — G2581 Restless legs syndrome: Secondary | ICD-10-CM | POA: Diagnosis not present

## 2020-06-12 DIAGNOSIS — G4733 Obstructive sleep apnea (adult) (pediatric): Secondary | ICD-10-CM | POA: Diagnosis not present

## 2020-06-12 DIAGNOSIS — Z9981 Dependence on supplemental oxygen: Secondary | ICD-10-CM | POA: Diagnosis not present

## 2020-06-12 DIAGNOSIS — E1142 Type 2 diabetes mellitus with diabetic polyneuropathy: Secondary | ICD-10-CM | POA: Diagnosis not present

## 2020-06-12 DIAGNOSIS — I130 Hypertensive heart and chronic kidney disease with heart failure and stage 1 through stage 4 chronic kidney disease, or unspecified chronic kidney disease: Secondary | ICD-10-CM | POA: Diagnosis not present

## 2020-06-12 DIAGNOSIS — I252 Old myocardial infarction: Secondary | ICD-10-CM | POA: Diagnosis not present

## 2020-06-12 DIAGNOSIS — Z9181 History of falling: Secondary | ICD-10-CM | POA: Diagnosis not present

## 2020-06-12 DIAGNOSIS — E039 Hypothyroidism, unspecified: Secondary | ICD-10-CM | POA: Diagnosis not present

## 2020-06-12 DIAGNOSIS — L97811 Non-pressure chronic ulcer of other part of right lower leg limited to breakdown of skin: Secondary | ICD-10-CM | POA: Diagnosis not present

## 2020-06-12 DIAGNOSIS — I482 Chronic atrial fibrillation, unspecified: Secondary | ICD-10-CM | POA: Diagnosis not present

## 2020-06-12 DIAGNOSIS — J9620 Acute and chronic respiratory failure, unspecified whether with hypoxia or hypercapnia: Secondary | ICD-10-CM | POA: Diagnosis not present

## 2020-06-12 DIAGNOSIS — N183 Chronic kidney disease, stage 3 unspecified: Secondary | ICD-10-CM | POA: Diagnosis not present

## 2020-06-12 DIAGNOSIS — I824Z2 Acute embolism and thrombosis of unspecified deep veins of left distal lower extremity: Secondary | ICD-10-CM | POA: Diagnosis not present

## 2020-06-12 DIAGNOSIS — I872 Venous insufficiency (chronic) (peripheral): Secondary | ICD-10-CM | POA: Diagnosis not present

## 2020-06-12 DIAGNOSIS — E1151 Type 2 diabetes mellitus with diabetic peripheral angiopathy without gangrene: Secondary | ICD-10-CM | POA: Diagnosis not present

## 2020-06-12 DIAGNOSIS — Z8673 Personal history of transient ischemic attack (TIA), and cerebral infarction without residual deficits: Secondary | ICD-10-CM | POA: Diagnosis not present

## 2020-06-12 DIAGNOSIS — I25119 Atherosclerotic heart disease of native coronary artery with unspecified angina pectoris: Secondary | ICD-10-CM | POA: Diagnosis not present

## 2020-06-12 DIAGNOSIS — M170 Bilateral primary osteoarthritis of knee: Secondary | ICD-10-CM | POA: Diagnosis not present

## 2020-06-14 ENCOUNTER — Other Ambulatory Visit: Payer: Self-pay | Admitting: Family Medicine

## 2020-06-14 ENCOUNTER — Ambulatory Visit: Payer: Medicare Other

## 2020-06-14 ENCOUNTER — Other Ambulatory Visit: Payer: Self-pay

## 2020-06-14 DIAGNOSIS — Z794 Long term (current) use of insulin: Secondary | ICD-10-CM

## 2020-06-14 DIAGNOSIS — N1832 Chronic kidney disease, stage 3b: Secondary | ICD-10-CM

## 2020-06-14 DIAGNOSIS — E1165 Type 2 diabetes mellitus with hyperglycemia: Secondary | ICD-10-CM

## 2020-06-14 MED ORDER — DEXCOM G6 SENSOR MISC: 3 refills | Status: DC

## 2020-06-14 MED ORDER — DEXCOM G6 TRANSMITTER MISC: 1.0000 | Freq: Once | 3 refills | Status: AC

## 2020-06-14 MED ORDER — HUMALOG 100 UNIT/ML ~~LOC~~ SOCT: SUBCUTANEOUS | 2 refills | Status: DC

## 2020-06-14 MED ORDER — DEXCOM G6 RECEIVER DEVI: 1.0000 | Freq: Three times a day (TID) | 0 refills | Status: DC

## 2020-06-14 NOTE — Chronic Care Management (AMB) (Signed)
Chronic Care Management Pharmacy  Name: Jon [name]  MRN: [id number] DOB: 01/15/1951  Chief Complaint/ HPI  Jon [name],  69 y.o. , male presents for their Follow-Up CCM visit with the clinical pharmacist via telephone due to COVID-19 Pandemic.  PCP : Rochel Brome, MD  Their chronic conditions include: Hypothyroidism, GERD, Afib, DM, Neuropathy, RLS, HLD, Vitamin D insuficiency, anemia, OA of both knees, hx of stroke.  Office Visits: 06/07/2020 - Sore throat and cough - negative COVID and strep test. Zpack, tessalon and chest x-ray.  06/06/2020 - sore throat and cough. Salt water gargle and covid test.  05/02/2020 -  Surgical clearance. Flu shot given. Hold metolazone Thursday and Friday. Continue Ozempic 1 mg. Take extra dose of Potassium Chloride above his regular dose until Monday.  04/25/2020 - increase Ozempic to 1 mg once weekly. Continue Levemir 40 units. Lactulose 30 ml bid. Continue Relistor.  03/27/2020 - increase ozempic 0.5 mg once weekly. Continue other medications.  Consult Visit: 06/04/2020 - podiatry visit. Sutures removed.  05/04/2020 - podiatry - amputation of toe.   Medications: Outpatient Encounter Medications as of 06/14/2020  Medication Sig  . albuterol (PROVENTIL) (2.5 MG/3ML) 0.083% nebulizer solution Take 3 mLs (2.5 mg total) by nebulization 2 (two) times daily. And as needed (Patient taking differently: Take 2.5 mg by nebulization 2 (two) times daily as needed for wheezing. )  . atorvastatin (LIPITOR) 40 MG tablet Take 40 mg by mouth daily.  . Azelastine HCl 0.15 % SOLN Place 2 sprays into both nostrils daily.   Marland Kitchen diltiazem (CARDIZEM CD) 120 MG 24 hr capsule TAKE 1 CAPSULE BY MOUTH DAILY  . docusate sodium (COLACE) 100 MG capsule Take 100 mg by mouth 2 (two) times daily.  . ferrous sulfate 325 (65 FE) MG tablet Take 325 mg by mouth daily.  . Glucagon, rDNA, (GLUCAGON EMERGENCY) 1 MG KIT Inject 1 mg as directed as needed (low blood sugar).   .  hydrALAZINE (APRESOLINE) 100 MG tablet Take 50 mg by mouth 2 (two) times daily.  . insulin detemir (LEVEMIR FLEXTOUCH) 100 UNIT/ML FlexPen Inject 26 Units into the skin daily. (Patient taking differently: Inject 30 Units into the skin daily. )  . isosorbide mononitrate (IMDUR) 120 MG 24 hr tablet TAKE 1 TABLET BY MOUTH ONCE DAILY IN THEMORNINGS  . lactulose (CHRONULAC) 10 GM/15ML solution TAKE 30MLS BY MOUTH IN THE MORNING AND AT BEDTIME  . metolazone (ZAROXOLYN) 2.5 MG tablet TAKE 1 TABLET BY MOUTH DAILY 3 HOURS PRIOR TO LASIX DAILY (Patient taking differently: Take 2.5 mg by mouth daily. 3 hours prior to torsemide)  . mometasone-formoterol (DULERA) 200-5 MCG/ACT AERO Inhale 2 puffs into the lungs 2 (two) times daily as needed for wheezing.  . nitroGLYCERIN (NITROSTAT) 0.4 MG SL tablet Place 0.4 mg under the tongue every 5 (five) minutes as needed for chest pain.   Marland Kitchen oxyCODONE-acetaminophen (PERCOCET/ROXICET) 5-325 MG tablet TAKE 1 TABLET BY MOUTH EVERY 4 HOURS AS NEEDED FOR SEVERE PAIN  . OXYGEN Inhale into the lungs. 2 liters at bedtime  . pantoprazole (PROTONIX) 40 MG tablet Take 40 mg by mouth in the morning.   . Potassium Chloride ER 20 MEQ TBCR Take 40 mEq by mouth 2 (two) times daily.   . pregabalin (LYRICA) 75 MG capsule TAKE 1 CAPSULE BY MOUTH TWICE DAILY (Patient taking differently: Take 75 mg by mouth 2 (two) times daily. )  . RELISTOR 150 MG TABS TAKE 3 TABLETS BY MOUTH EVERY MORNING  . Semaglutide,  1 MG/DOSE, (OZEMPIC, 1 MG/DOSE,) 2 MG/1.5ML SOPN Inject 1 mg into the skin once a week.  . Vitamin D, Ergocalciferol, (DRISDOL) 1.25 MG (50000 UNIT) CAPS capsule TAKE 1 CAPSULE BY MOUTH DAILY  . XARELTO 15 MG TABS tablet TAKE 1 TABLET BY MOUTH ONCE DAILY (Patient taking differently: Take 15 mg by mouth at bedtime. )  . XTAMPZA ER 27 MG C12A Take 27 mg by mouth 2 (two) times daily.  . [DISCONTINUED] azithromycin (ZITHROMAX) 250 MG tablet Take 2 po today and 1 po day 2-5 (Patient not taking:  Reported on 06/14/2020)  . [DISCONTINUED] BD PEN NEEDLE NANO U/F 32G X 4 MM MISC   . [DISCONTINUED] benzonatate (TESSALON) 200 MG capsule Take 1 capsule (200 mg total) by mouth 3 (three) times daily as needed for cough. (Patient not taking: Reported on 06/14/2020)  . [DISCONTINUED] clopidogrel (PLAVIX) 75 MG tablet Take 1 tablet (75 mg total) by mouth daily.  . [DISCONTINUED] torsemide (DEMADEX) 20 MG tablet Take 3 tablets (60 mg total) by mouth 2 (two) times daily.   No facility-administered encounter medications on file as of 06/14/2020.   Allergies  Allergen Reactions  . Hydrocodone Itching  . Morphine Itching  . Duloxetine Hcl Other (See Comments)    Unknown reaction  . Codeine Itching  . Penicillins Itching and Rash    DID THE REACTION INVOLVE: Swelling of the face/tongue/throat, SOB, or low BP? No Sudden or severe rash/hives, skin peeling, or the inside of the mouth or nose? No Did it require medical treatment? No When did it last happen?unknown If all above answers are "NO", may proceed with cephalosporin use.    SDOH Screenings   Alcohol Screen:   . Last Alcohol Screening Score (AUDIT): Not on file  Depression (PHQ2-9): Medium Risk  . PHQ-2 Score: 5  Financial Resource Strain:   . Difficulty of Paying Living Expenses: Not on file  Food Insecurity:   . Worried About Charity fundraiser in the Last Year: Not on file  . Ran Out of Food in the Last Year: Not on file  Housing: Low Risk   . Last Housing Risk Score: 0  Physical Activity: Inactive  . Days of Exercise per Week: 0 days  . Minutes of Exercise per Session: 0 min  Social Connections:   . Frequency of Communication with Friends and Family: Not on file  . Frequency of Social Gatherings with Friends and Family: Not on file  . Attends Religious Services: Not on file  . Active Member of Clubs or Organizations: Not on file  . Attends Archivist Meetings: Not on file  . Marital Status: Not on file    Stress:   . Feeling of Stress : Not on file  Tobacco Use: Medium Risk  . Smoking Tobacco Use: Former Smoker  . Smokeless Tobacco Use: Never Used  Transportation Needs: No Transportation Needs  . Lack of Transportation (Medical): No  . Lack of Transportation (Non-Medical): No    Current Diagnosis/Assessment:  Goals Addressed            This Visit's Progress   . Pharmacy Care Plan       CARE PLAN ENTRY  Current Barriers:  . Chronic Disease Management support, education, and care coordination needs related to Hypertension and Diabetes   Hypertension . Pharmacist Clinical Goal(s): o Over the next 90 days, patient will work with PharmD and providers to achieve BP goal <130/80 . Current regimen:  o hydralazine 50 mg bid  o metolazone 2.5 mg daily o torsemide 60 mg bid . Interventions: o Pharmacist helped patient reconcile medications. . Patient self care activities - Over the next 90 days, patient will: o Check BP daily, document, and provide at future appointments o Ensure daily salt intake < 2300 mg/day  Hyperlipidemia . Pharmacist Clinical Goal(s): o Over the next 90 days, patient will work with PharmD and providers to maintain LDL goal < 70 . Current regimen:  o Atorvastatin 40 mg daily . Interventions: o Recommend patient continue taking medication as prescribed.  . Patient self care activities - Over the next 90 days, patient will: o Take medication as prescribed   Diabetes . Pharmacist Clinical Goal(s): o Over the next 90 days, patient will work with PharmD and providers to achieve A1c goal <7% . Current regimen:  o glucagon kit o levemir flextouch 40 units daily o Ozempic 1 mg weekly o Dexcom o Humalog - sliding scale insulin  . Interventions: o Recommend patient begin sliding scale insulin.  o Coordinating cgm for patient increased frequency testing.  o Coordinated with pharmacy to pick-up Ozempic 1 mg weekly.  . Patient self care activities - Over the  next 90 days, patient will: o Check blood sugar twice daily, document, and provide at future appointments o Contact provider with any episodes of hypoglycemia  Medication management . Pharmacist Clinical Goal(s): o Over the next 90 days, patient will work with PharmD and providers to achieve optimal medication adherence . Current pharmacy: [facility] . Interventions o Comprehensive medication review performed. o Continue current medication management strategy . Patient self care activities - Over the next 90 days, patient will: o Focus on medication adherence by using pill box o Take medications as prescribed o Report any questions or concerns to PharmD and/or provider(s)  Please see past updates related to this goal by clicking on the "Past Updates" button in the selected goal         Diabetes   Recent Relevant Labs: Lab Results  Component Value Date/Time   HGBA1C 8.8 (H) 03/27/2020 02:40 PM   HGBA1C 8.6 (H) 12/13/2019 07:51 AM    Kidney Function Lab Results  Component Value Date/Time   CREATININE 2.50 (H) 05/01/2020 01:42 PM   CREATININE 2.07 (H) 03/27/2020 02:40 PM   GFRNONAA 25 (L) 05/01/2020 01:42 PM   GFRAA 29 (L) 05/01/2020 01:42 PM    Checking BG: 3x per Day  Recent FBG Readings: 135, 126, 85, 111, 132 Recent 4 pm Readings: 227, 219, 206, 237, 209  Recent HS BG readings: 343, 327, 321, 361, 327  Patient has failed these meds in past: novolog, humulin, metformin, novolog  Patient is currently uncontrolled on the following medications:   glucagon kit  levemir flextouch 30 units daily  Ozempic 1 mg weekly   Last diabetic Foot exam: 01/17/2020 Last diabetic Eye exam: last one noted in file 2016   We discussed: Patient's blood sugar remains elevated. Pharmacist discussed the option for CGM with provider. Provider adding sliding scale insulin. Pharmacist contacted patient and coordinated Dexcom delivery to patient. Patient acknowledges understanding and  will contact pharmacist with any low blood sugar readings.    Plan Begin sliding scale insulin and using Dexcom.    Medication Management   Pt uses [facility] for all medications Uses pill box? Yes Pt endorses great compliance  We discussed: Encouraged patient to consider pharmacy delivery. Patient declines at this time.   Plan  Continue current medication management strategy  Follow up: 2 month phone visit

## 2020-06-14 NOTE — Progress Notes (Signed)
Humalog insulin Sliding scale insulin Check before each meal and administer as follows.  >150 - 200 2 U 201-250  4 U 251- 300 8 U 301 -350 12 U 351 - 400 16 U > 400 20 U  Sent dexcom to [facility].

## 2020-06-15 ENCOUNTER — Other Ambulatory Visit: Payer: Self-pay | Admitting: Family Medicine

## 2020-06-15 DIAGNOSIS — I872 Venous insufficiency (chronic) (peripheral): Secondary | ICD-10-CM | POA: Diagnosis not present

## 2020-06-15 DIAGNOSIS — G4733 Obstructive sleep apnea (adult) (pediatric): Secondary | ICD-10-CM | POA: Diagnosis not present

## 2020-06-15 DIAGNOSIS — E1142 Type 2 diabetes mellitus with diabetic polyneuropathy: Secondary | ICD-10-CM | POA: Diagnosis not present

## 2020-06-15 DIAGNOSIS — L97811 Non-pressure chronic ulcer of other part of right lower leg limited to breakdown of skin: Secondary | ICD-10-CM | POA: Diagnosis not present

## 2020-06-15 DIAGNOSIS — Z8673 Personal history of transient ischemic attack (TIA), and cerebral infarction without residual deficits: Secondary | ICD-10-CM | POA: Diagnosis not present

## 2020-06-15 DIAGNOSIS — K219 Gastro-esophageal reflux disease without esophagitis: Secondary | ICD-10-CM | POA: Diagnosis not present

## 2020-06-15 DIAGNOSIS — E1151 Type 2 diabetes mellitus with diabetic peripheral angiopathy without gangrene: Secondary | ICD-10-CM | POA: Diagnosis not present

## 2020-06-15 DIAGNOSIS — N183 Chronic kidney disease, stage 3 unspecified: Secondary | ICD-10-CM | POA: Diagnosis not present

## 2020-06-15 DIAGNOSIS — I25119 Atherosclerotic heart disease of native coronary artery with unspecified angina pectoris: Secondary | ICD-10-CM | POA: Diagnosis not present

## 2020-06-15 DIAGNOSIS — I482 Chronic atrial fibrillation, unspecified: Secondary | ICD-10-CM | POA: Diagnosis not present

## 2020-06-15 DIAGNOSIS — I5032 Chronic diastolic (congestive) heart failure: Secondary | ICD-10-CM | POA: Diagnosis not present

## 2020-06-15 DIAGNOSIS — J9620 Acute and chronic respiratory failure, unspecified whether with hypoxia or hypercapnia: Secondary | ICD-10-CM | POA: Diagnosis not present

## 2020-06-15 DIAGNOSIS — M797 Fibromyalgia: Secondary | ICD-10-CM | POA: Diagnosis not present

## 2020-06-15 DIAGNOSIS — I824Z2 Acute embolism and thrombosis of unspecified deep veins of left distal lower extremity: Secondary | ICD-10-CM | POA: Diagnosis not present

## 2020-06-15 DIAGNOSIS — E1122 Type 2 diabetes mellitus with diabetic chronic kidney disease: Secondary | ICD-10-CM | POA: Diagnosis not present

## 2020-06-15 DIAGNOSIS — M170 Bilateral primary osteoarthritis of knee: Secondary | ICD-10-CM | POA: Diagnosis not present

## 2020-06-15 DIAGNOSIS — J449 Chronic obstructive pulmonary disease, unspecified: Secondary | ICD-10-CM | POA: Diagnosis not present

## 2020-06-15 DIAGNOSIS — G2581 Restless legs syndrome: Secondary | ICD-10-CM | POA: Diagnosis not present

## 2020-06-15 DIAGNOSIS — Z9181 History of falling: Secondary | ICD-10-CM | POA: Diagnosis not present

## 2020-06-15 DIAGNOSIS — E039 Hypothyroidism, unspecified: Secondary | ICD-10-CM | POA: Diagnosis not present

## 2020-06-15 DIAGNOSIS — Z9981 Dependence on supplemental oxygen: Secondary | ICD-10-CM | POA: Diagnosis not present

## 2020-06-15 DIAGNOSIS — I130 Hypertensive heart and chronic kidney disease with heart failure and stage 1 through stage 4 chronic kidney disease, or unspecified chronic kidney disease: Secondary | ICD-10-CM | POA: Diagnosis not present

## 2020-06-15 DIAGNOSIS — I252 Old myocardial infarction: Secondary | ICD-10-CM | POA: Diagnosis not present

## 2020-06-15 DIAGNOSIS — E785 Hyperlipidemia, unspecified: Secondary | ICD-10-CM | POA: Diagnosis not present

## 2020-06-16 ENCOUNTER — Other Ambulatory Visit: Payer: Self-pay | Admitting: Family Medicine

## 2020-06-19 DIAGNOSIS — J9620 Acute and chronic respiratory failure, unspecified whether with hypoxia or hypercapnia: Secondary | ICD-10-CM | POA: Diagnosis not present

## 2020-06-19 DIAGNOSIS — E039 Hypothyroidism, unspecified: Secondary | ICD-10-CM | POA: Diagnosis not present

## 2020-06-19 DIAGNOSIS — Z8673 Personal history of transient ischemic attack (TIA), and cerebral infarction without residual deficits: Secondary | ICD-10-CM | POA: Diagnosis not present

## 2020-06-19 DIAGNOSIS — I25119 Atherosclerotic heart disease of native coronary artery with unspecified angina pectoris: Secondary | ICD-10-CM | POA: Diagnosis not present

## 2020-06-19 DIAGNOSIS — J449 Chronic obstructive pulmonary disease, unspecified: Secondary | ICD-10-CM | POA: Diagnosis not present

## 2020-06-19 DIAGNOSIS — I824Z2 Acute embolism and thrombosis of unspecified deep veins of left distal lower extremity: Secondary | ICD-10-CM | POA: Diagnosis not present

## 2020-06-19 DIAGNOSIS — N183 Chronic kidney disease, stage 3 unspecified: Secondary | ICD-10-CM | POA: Diagnosis not present

## 2020-06-19 DIAGNOSIS — E1142 Type 2 diabetes mellitus with diabetic polyneuropathy: Secondary | ICD-10-CM | POA: Diagnosis not present

## 2020-06-19 DIAGNOSIS — L97811 Non-pressure chronic ulcer of other part of right lower leg limited to breakdown of skin: Secondary | ICD-10-CM | POA: Diagnosis not present

## 2020-06-19 DIAGNOSIS — Z9981 Dependence on supplemental oxygen: Secondary | ICD-10-CM | POA: Diagnosis not present

## 2020-06-19 DIAGNOSIS — G4733 Obstructive sleep apnea (adult) (pediatric): Secondary | ICD-10-CM | POA: Diagnosis not present

## 2020-06-19 DIAGNOSIS — E1122 Type 2 diabetes mellitus with diabetic chronic kidney disease: Secondary | ICD-10-CM | POA: Diagnosis not present

## 2020-06-19 DIAGNOSIS — I130 Hypertensive heart and chronic kidney disease with heart failure and stage 1 through stage 4 chronic kidney disease, or unspecified chronic kidney disease: Secondary | ICD-10-CM | POA: Diagnosis not present

## 2020-06-19 DIAGNOSIS — E785 Hyperlipidemia, unspecified: Secondary | ICD-10-CM | POA: Diagnosis not present

## 2020-06-19 DIAGNOSIS — Z9181 History of falling: Secondary | ICD-10-CM | POA: Diagnosis not present

## 2020-06-19 DIAGNOSIS — M797 Fibromyalgia: Secondary | ICD-10-CM | POA: Diagnosis not present

## 2020-06-19 DIAGNOSIS — I5032 Chronic diastolic (congestive) heart failure: Secondary | ICD-10-CM | POA: Diagnosis not present

## 2020-06-19 DIAGNOSIS — M170 Bilateral primary osteoarthritis of knee: Secondary | ICD-10-CM | POA: Diagnosis not present

## 2020-06-19 DIAGNOSIS — E1151 Type 2 diabetes mellitus with diabetic peripheral angiopathy without gangrene: Secondary | ICD-10-CM | POA: Diagnosis not present

## 2020-06-19 DIAGNOSIS — K219 Gastro-esophageal reflux disease without esophagitis: Secondary | ICD-10-CM | POA: Diagnosis not present

## 2020-06-19 DIAGNOSIS — I482 Chronic atrial fibrillation, unspecified: Secondary | ICD-10-CM | POA: Diagnosis not present

## 2020-06-19 DIAGNOSIS — G2581 Restless legs syndrome: Secondary | ICD-10-CM | POA: Diagnosis not present

## 2020-06-19 DIAGNOSIS — I872 Venous insufficiency (chronic) (peripheral): Secondary | ICD-10-CM | POA: Diagnosis not present

## 2020-06-19 DIAGNOSIS — I252 Old myocardial infarction: Secondary | ICD-10-CM | POA: Diagnosis not present

## 2020-06-21 ENCOUNTER — Ambulatory Visit: Payer: Medicare Other | Admitting: Cardiology

## 2020-06-21 ENCOUNTER — Other Ambulatory Visit: Payer: Self-pay | Admitting: Family Medicine

## 2020-06-22 NOTE — Patient Instructions (Addendum)
Visit Information  Goals Addressed            This Visit's Progress   . Pharmacy Care Plan       CARE PLAN ENTRY  Current Barriers:  . Chronic Disease Management support, education, and care coordination needs related to Hypertension and Diabetes   Hypertension . Pharmacist Clinical Goal(s): o Over the next 90 days, patient will work with PharmD and providers to achieve BP goal <130/80 . Current regimen:  o hydralazine 50 mg bid o metolazone 2.5 mg daily o torsemide 60 mg bid . Interventions: o Pharmacist helped patient reconcile medications. . Patient self care activities - Over the next 90 days, patient will: o Check BP daily, document, and provide at future appointments o Ensure daily salt intake < 2300 mg/day  Hyperlipidemia . Pharmacist Clinical Goal(s): o Over the next 90 days, patient will work with PharmD and providers to maintain LDL goal < 70 . Current regimen:  o Atorvastatin 40 mg daily . Interventions: o Recommend patient continue taking medication as prescribed.  . Patient self care activities - Over the next 90 days, patient will: o Take medication as prescribed   Diabetes . Pharmacist Clinical Goal(s): o Over the next 90 days, patient will work with PharmD and providers to achieve A1c goal <7% . Current regimen:  o glucagon kit o levemir flextouch 40 units daily o Ozempic 1 mg weekly o Dexcom o Humalog - sliding scale insulin  . Interventions: o Recommend patient begin sliding scale insulin.  o Coordinating cgm for patient increased frequency testing.  o Coordinated with pharmacy to pick-up Ozempic 1 mg weekly.  . Patient self care activities - Over the next 90 days, patient will: o Check blood sugar twice daily, document, and provide at future appointments o Contact provider with any episodes of hypoglycemia  Medication management . Pharmacist Clinical Goal(s): o Over the next 90 days, patient will work with PharmD and providers to achieve  optimal medication adherence . Current pharmacy: [facility] . Interventions o Comprehensive medication review performed. o Continue current medication management strategy . Patient self care activities - Over the next 90 days, patient will: o Focus on medication adherence by using pill box o Take medications as prescribed o Report any questions or concerns to PharmD and/or provider(s)  Please see past updates related to this goal by clicking on the "Past Updates" button in the selected goal         Patient verbalizes understanding of instructions provided today.   Telephone follow up appointment with pharmacy team member scheduled for: 08/2020  Sherre Poot, PharmD, [facility] Clinical Pharmacist [facility] [phone] (office) [phone] (mobile)  Blood Glucose Monitoring, Adult Monitoring your blood sugar (glucose) is an important part of managing your diabetes (diabetes mellitus). Blood glucose monitoring involves checking your blood glucose as often as directed and keeping a record (log) of your results over time. Checking your blood glucose regularly and keeping a blood glucose log can:  Help you and your health care provider adjust your diabetes management plan as needed, including your medicines or insulin.  Help you understand how food, exercise, illnesses, and medicines affect your blood glucose.  Let you know what your blood glucose is at any time. You can quickly find out if you have low blood glucose (hypoglycemia) or high blood glucose (hyperglycemia). Your health care provider will set individualized treatment goals for you. Your goals will be based on your age, other medical conditions you have, and  how you respond to diabetes treatment. Generally, the goal of treatment is to maintain the following blood glucose levels:  Before meals (preprandial): 80-130 mg/dL (4.4-7.2 mmol/L).  After meals (postprandial): below 180 mg/dL (10 mmol/L).  A1c  level: less than 7%. Supplies needed:  Blood glucose meter.  Test strips for your meter. Each meter has its own strips. You must use the strips that came with your meter.  A needle to prick your finger (lancet). Do not use a lancet more than one time.  A device that holds the lancet (lancing device).  A journal or log book to write down your results. How to check your blood glucose  1. Wash your hands with soap and water. 2. Prick the side of your finger (not the tip) with the lancet. Use a different finger each time. 3. Gently rub the finger until a small drop of blood appears. 4. Follow instructions that come with your meter for inserting the test strip, applying blood to the strip, and using your blood glucose meter. 5. Write down your result and any notes. Some meters allow you to use areas of your body other than your finger (alternative sites) to test your blood. The most common alternative sites are:  Forearm.  Thigh.  Palm of the hand. If you think you may have hypoglycemia, or if you have a history of not knowing when your blood glucose is getting low (hypoglycemia unawareness), do not use alternative sites. Use your finger instead. Alternative sites may not be as accurate as the fingers, because blood flow is slower in these areas. This means that the result you get may be delayed, and it may be different from the result that you would get from your finger. Follow these instructions at home: Blood glucose log   Every time you check your blood glucose, write down your result. Also write down any notes about things that may be affecting your blood glucose, such as your diet and exercise for the day. This information can help you and your health care provider: ? Look for patterns in your blood glucose over time. ? Adjust your diabetes management plan as needed.  Check if your meter allows you to download your records to a computer. Most glucose meters store a record of  glucose readings in the meter. If you have type 1 diabetes:  Check your blood glucose 2 or more times a day.  Also check your blood glucose: ? Before every insulin injection. ? Before and after exercise. ? Before meals. ? 2 hours after a meal. ? Occasionally between 2:00 a.m. and 3:00 a.m., as directed. ? Before potentially dangerous tasks, like driving or using heavy machinery. ? At bedtime.  You may need to check your blood glucose more often, up to 6-10 times a day, if you: ? Use an insulin pump. ? Need multiple daily injections (MDI). ? Have diabetes that is not well-controlled. ? Are ill. ? Have a history of severe hypoglycemia. ? Have hypoglycemia unawareness. If you have type 2 diabetes:  If you take insulin or other diabetes medicines, check your blood glucose 2 or more times a day.  If you are on intensive insulin therapy, check your blood glucose 4 or more times a day. Occasionally, you may also need to check between 2:00 a.m. and 3:00 a.m., as directed.  Also check your blood glucose: ? Before and after exercise. ? Before potentially dangerous tasks, like driving or using heavy machinery.  You may need to check  your blood glucose more often if: ? Your medicine is being adjusted. ? Your diabetes is not well-controlled. ? You are ill. General tips  Always keep your supplies with you.  If you have questions or need help, all blood glucose meters have a 24-hour "hotline" phone number that you can call. You may also contact your health care provider.  After you use a few boxes of test strips, adjust (calibrate) your blood glucose meter by following instructions that came with your meter. Contact a health care provider if:  Your blood glucose is at or above 240 mg/dL (13.3 mmol/L) for 2 days in a row.  You have been sick or have had a fever for 2 days or longer, and you are not getting better.  You have any of the following problems for more than 6 hours: ? You  cannot eat or drink. ? You have nausea or vomiting. ? You have diarrhea. Get help right away if:  Your blood glucose is lower than 54 mg/dL (3 mmol/L).  You become confused or you have trouble thinking clearly.  You have difficulty breathing.  You have moderate or large ketone levels in your urine. Summary  Monitoring your blood sugar (glucose) is an important part of managing your diabetes (diabetes mellitus).  Blood glucose monitoring involves checking your blood glucose as often as directed and keeping a record (log) of your results over time.  Your health care provider will set individualized treatment goals for you. Your goals will be based on your age, other medical conditions you have, and how you respond to diabetes treatment.  Every time you check your blood glucose, write down your result. Also write down any notes about things that may be affecting your blood glucose, such as your diet and exercise for the day. This information is not intended to replace advice given to you by your health care provider. Make sure you discuss any questions you have with your health care provider. Document Revised: 06/11/2018 Document Reviewed: 01/28/2016 Elsevier Patient Education  2020 [employer].

## 2020-06-24 DIAGNOSIS — J9610 Chronic respiratory failure, unspecified whether with hypoxia or hypercapnia: Secondary | ICD-10-CM | POA: Diagnosis not present

## 2020-06-25 DIAGNOSIS — E785 Hyperlipidemia, unspecified: Secondary | ICD-10-CM | POA: Diagnosis not present

## 2020-06-25 DIAGNOSIS — M797 Fibromyalgia: Secondary | ICD-10-CM | POA: Diagnosis not present

## 2020-06-25 DIAGNOSIS — E1122 Type 2 diabetes mellitus with diabetic chronic kidney disease: Secondary | ICD-10-CM | POA: Diagnosis not present

## 2020-06-25 DIAGNOSIS — I130 Hypertensive heart and chronic kidney disease with heart failure and stage 1 through stage 4 chronic kidney disease, or unspecified chronic kidney disease: Secondary | ICD-10-CM | POA: Diagnosis not present

## 2020-06-25 DIAGNOSIS — E1142 Type 2 diabetes mellitus with diabetic polyneuropathy: Secondary | ICD-10-CM | POA: Diagnosis not present

## 2020-06-25 DIAGNOSIS — M170 Bilateral primary osteoarthritis of knee: Secondary | ICD-10-CM | POA: Diagnosis not present

## 2020-06-25 DIAGNOSIS — I25119 Atherosclerotic heart disease of native coronary artery with unspecified angina pectoris: Secondary | ICD-10-CM | POA: Diagnosis not present

## 2020-06-25 DIAGNOSIS — G4733 Obstructive sleep apnea (adult) (pediatric): Secondary | ICD-10-CM | POA: Diagnosis not present

## 2020-06-25 DIAGNOSIS — I5032 Chronic diastolic (congestive) heart failure: Secondary | ICD-10-CM | POA: Diagnosis not present

## 2020-06-25 DIAGNOSIS — I824Z2 Acute embolism and thrombosis of unspecified deep veins of left distal lower extremity: Secondary | ICD-10-CM | POA: Diagnosis not present

## 2020-06-25 DIAGNOSIS — N183 Chronic kidney disease, stage 3 unspecified: Secondary | ICD-10-CM | POA: Diagnosis not present

## 2020-06-25 DIAGNOSIS — I872 Venous insufficiency (chronic) (peripheral): Secondary | ICD-10-CM | POA: Diagnosis not present

## 2020-06-25 DIAGNOSIS — Z9181 History of falling: Secondary | ICD-10-CM | POA: Diagnosis not present

## 2020-06-25 DIAGNOSIS — Z8673 Personal history of transient ischemic attack (TIA), and cerebral infarction without residual deficits: Secondary | ICD-10-CM | POA: Diagnosis not present

## 2020-06-25 DIAGNOSIS — G2581 Restless legs syndrome: Secondary | ICD-10-CM | POA: Diagnosis not present

## 2020-06-25 DIAGNOSIS — E039 Hypothyroidism, unspecified: Secondary | ICD-10-CM | POA: Diagnosis not present

## 2020-06-25 DIAGNOSIS — Z9981 Dependence on supplemental oxygen: Secondary | ICD-10-CM | POA: Diagnosis not present

## 2020-06-25 DIAGNOSIS — J449 Chronic obstructive pulmonary disease, unspecified: Secondary | ICD-10-CM | POA: Diagnosis not present

## 2020-06-25 DIAGNOSIS — E1151 Type 2 diabetes mellitus with diabetic peripheral angiopathy without gangrene: Secondary | ICD-10-CM | POA: Diagnosis not present

## 2020-06-25 DIAGNOSIS — I252 Old myocardial infarction: Secondary | ICD-10-CM | POA: Diagnosis not present

## 2020-06-25 DIAGNOSIS — J9620 Acute and chronic respiratory failure, unspecified whether with hypoxia or hypercapnia: Secondary | ICD-10-CM | POA: Diagnosis not present

## 2020-06-25 DIAGNOSIS — L97811 Non-pressure chronic ulcer of other part of right lower leg limited to breakdown of skin: Secondary | ICD-10-CM | POA: Diagnosis not present

## 2020-06-25 DIAGNOSIS — K219 Gastro-esophageal reflux disease without esophagitis: Secondary | ICD-10-CM | POA: Diagnosis not present

## 2020-06-25 DIAGNOSIS — I482 Chronic atrial fibrillation, unspecified: Secondary | ICD-10-CM | POA: Diagnosis not present

## 2020-06-26 DIAGNOSIS — J9610 Chronic respiratory failure, unspecified whether with hypoxia or hypercapnia: Secondary | ICD-10-CM | POA: Diagnosis not present

## 2020-06-27 ENCOUNTER — Ambulatory Visit: Payer: Medicare Other | Admitting: Family Medicine

## 2020-07-03 ENCOUNTER — Other Ambulatory Visit: Payer: Self-pay | Admitting: Family Medicine

## 2020-07-04 NOTE — Progress Notes (Signed)
° °Subjective:  °Patient ID: Jon [name], male    DOB: 07/25/1951  Age: 69 y.o. MRN: [id number] ° °Chief Complaint  °Patient presents with  °• Diabetes  °• Constipation  ° °HPI:  °Hypertensive heart disease with heart failure (HCC)-patient presents for follow-up of chronic medical issues including his blood pressure.  His blood pressure at home ranges 93/52-160 7/56.  Pulse 60 to 70s.  The patient's blood pressure medications include diltiazem 120 mg once daily, hydralazine 100 mg half tablet twice daily, Imdur 120 mg once daily in the morning, Demadex 20 mg 3 tablets twice daily, preceded by metolazone 2.5 mg 3 hours prior to his torsemide.  He takes potassium chloride 20 mEq 2 twice daily.  The patient reports he is having falls every other week approximately.  He is very vague about exactly how often this is.  He does have a life alert which he has used because he requires assistance to get up.  He has not checked his blood pressure at these times nor has he checked his blood sugar.  At the time of these falls he does not report any significant injuries.  He denies passing out.  He also denies hitting his head. ° °Right great toe amputation: Patient had his toe amputated approximately 1 to 2 months ago.  Per the patient it is healing well.  His legs are currently wrapped up.  He has an appointment with his podiatrist later this morning.   ° °Type 2 diabetes mellitus with stage 3b chronic kidney disease, with long-term current use of insulin (HCC): Diabetes is also complicated by neuropathy. °The patient sugars range in the morning from 70-201, lunchtime 116-247, and at suppertime 171-266.  He is using a Dexcom.  Diabetes medicines include Levemir 30 units daily, and a Humalog sliding scale insulin, and Ozempic 1 mg once weekly.  Patient is not eating a healthy diet.  He skips meals.  He is unable to exercise due to his osteoarthritis and torn ligaments in his knees.  He is currently taking Lyrica for his  neuropathy. ° °Chronic pain disorder: Patient has severe pain in his knees and his lumbar back.  He currently takes Xtampza ER 27 mg 1 capsule twice daily. ° °Drug-induced constipation - well controlled on lactulose, relistor, colace.  He is no longer taking Movantik. ° °Atrial fibrillation: Patient denies palpitations.  He is currently on Xarelto 15 mg once daily, diltiazem sustained release 120 mg once daily.  Patient also has a history of DVT which also supports his continuing on Xarelto. ° °Patient has hyperlipidemia.  He is currently on Lipitor 40 mg once daily.  As stated above he did not eat healthy. ° °GERD: Patient is currently on Pepcid 20 mg 2 tablets twice daily and pantoprazole 40 mg once daily. ° °Patient has a history of atherosclerosis in the form of a CVA and peripheral vascular disease.  He is currently on Plavix. ° °Chronic respiratory failure: Patient wears oxygen at night at 2 L.  He also has sleep apnea and wears a CPAP.  He also has COPD.  He currently uses only albuterol every 6 hours as needed for shortness of breath.  He is on Dulera 2 puffs twice daily.  He sees pulmonology.  He has had 2 Covid vaccines, but does need his booster. ° °Patient has anemia of chronic disease related to his chronic kidney disease.  He also has had some iron deficiency anemia and takes ferrous sulfate 325 mg once daily. ° °  Current Outpatient Medications on File Prior to Visit  °Medication Sig Dispense Refill  °• albuterol (PROVENTIL) (2.5 MG/3ML) 0.083% nebulizer solution Take 3 mLs (2.5 mg total) by nebulization 2 (two) times daily. And as needed (Patient taking differently: Take 2.5 mg by nebulization 2 (two) times daily as needed for wheezing. ) 75 mL 5  °• atorvastatin (LIPITOR) 40 MG tablet Take 40 mg by mouth daily.    °• Azelastine HCl 0.15 % SOLN Place 2 sprays into both nostrils daily.     °• BD PEN NEEDLE NANO 2ND GEN 32G X 4 MM MISC USE DAILY WITH BASAGLAR 100 each 3  °• clopidogrel (PLAVIX) 75 MG  tablet TAKE 1 TABLET BY MOUTH DAILY 30 tablet 5  °• Continuous Blood Gluc Receiver (DEXCOM G6 RECEIVER) DEVI 1 each by Does not apply route 4 (four) times daily -  before meals and at bedtime. 1 each 0  °• Continuous Blood Gluc Sensor (DEXCOM G6 SENSOR) MISC Apply every 10 days 3 each 3  °• diltiazem (CARDIZEM CD) 120 MG 24 hr capsule TAKE 1 CAPSULE BY MOUTH DAILY 90 capsule 1  °• docusate sodium (COLACE) 100 MG capsule Take 100 mg by mouth 2 (two) times daily.    °• famotidine (PEPCID) 20 MG tablet TAKE 2 TABLETS BY MOUTH TWICE DAILY 120 tablet 5  °• ferrous sulfate 325 (65 FE) MG tablet Take 325 mg by mouth daily.    °• Glucagon, rDNA, (GLUCAGON EMERGENCY) 1 MG KIT Inject 1 mg as directed as needed (low blood sugar).     °• hydrALAZINE (APRESOLINE) 100 MG tablet Take 50 mg by mouth 2 (two) times daily.    °• insulin detemir (LEVEMIR FLEXTOUCH) 100 UNIT/ML FlexPen Inject 26 Units into the skin daily. (Patient taking differently: Inject 30 Units into the skin daily. ) 15 mL 11  °• insulin lispro (HUMALOG) 100 UNIT/ML cartridge SSI ranging up 60 U 15 mL 2  °• isosorbide mononitrate (IMDUR) 120 MG 24 hr tablet TAKE 1 TABLET BY MOUTH ONCE DAILY IN THEMORNINGS 90 tablet 1  °• lactulose (CHRONULAC) 10 GM/15ML solution TAKE 30MLS BY MOUTH IN THE MORNING AND AT BEDTIME 946 mL 2  °• metolazone (ZAROXOLYN) 2.5 MG tablet TAKE 1 TABLET BY MOUTH DAILY 3 HOURS PRIOR TO LASIX DAILY (Patient taking differently: Take 2.5 mg by mouth daily. 3 hours prior to torsemide) 36 tablet 3  °• mometasone-formoterol (DULERA) 200-5 MCG/ACT AERO Inhale 2 puffs into the lungs 2 (two) times daily as needed for wheezing.    °• nitroGLYCERIN (NITROSTAT) 0.4 MG SL tablet Place 0.4 mg under the tongue every 5 (five) minutes as needed for chest pain.     °• oxyCODONE-acetaminophen (PERCOCET/ROXICET) 5-325 MG tablet TAKE 1 TABLET BY MOUTH EVERY 4 HOURS AS NEEDED FOR SEVERE PAIN 20 tablet 0  °• OXYGEN Inhale into the lungs. 2 liters at bedtime    °•  pantoprazole (PROTONIX) 40 MG tablet Take 40 mg by mouth in the morning.     °• Potassium Chloride ER 20 MEQ TBCR Take 40 mEq by mouth 2 (two) times daily.     °• pregabalin (LYRICA) 75 MG capsule TAKE 1 CAPSULE BY MOUTH TWICE DAILY (Patient taking differently: Take 75 mg by mouth 2 (two) times daily. ) 180 capsule 2  °• RELISTOR 150 MG TABS TAKE 3 TABLETS BY MOUTH EVERY MORNING 90 tablet 0  °• Semaglutide, 1 MG/DOSE, (OZEMPIC, 1 MG/DOSE,) 2 MG/1.5ML SOPN Inject 1 mg into the skin once a week.   a week. 3 mL 2   torsemide (DEMADEX) 20 MG tablet TAKE 3 TABLETS BY MOUTH AT LUNCH AND TAKE 3 TABLETS AT SUPPER 540 tablet 1   Vitamin D, Ergocalciferol, (DRISDOL) 1.25 MG (50000 UNIT) CAPS capsule TAKE 1 CAPSULE BY MOUTH DAILY 90 capsule 1   XARELTO 15 MG TABS tablet TAKE 1 TABLET BY MOUTH ONCE DAILY (Patient taking differently: Take 15 mg by mouth at bedtime. ) 90 tablet 2   XTAMPZA ER 27 MG C12A TAKE 1 CAPSULE BY MOUTH TWICE DAILY 60 capsule 0   No current facility-administered medications on file prior to visit.   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   OLBx 01/20/14 BOOP Started steroids 02/28/14    CHF (congestive heart failure) (HCC)    Chronic anticoagulation-Xarelto 02/09/2014   Chronic atrial fibrillation ([place]) 01/03/2014   Chronic bronchitis ([place]) 12/21/2013   Chronic diastolic heart failure ([place]) 02/22/2015   Chronic pain of both knees 03/18/2017   Overview:  Added automatically from request for surgery [id number]   Chronic respiratory failure ([place]) 04/10/2015   COPD (chronic obstructive pulmonary disease) ([place]) 01/25/2015   Cough    coughing up blood- started last nite, seems to be worse now   CVA  (cerebral vascular accident) ([place]) 02/09/2014   Dysrhythmia    atrial fib takes cardizem,    Fibromyalgia    GERD (gastroesophageal reflux disease)    H/O hiatal hernia    Hemoptysis 08/01/2015   Herpes zoster 06/26/2015   History of stroke    Hyperkalemia-repeat pending 11/29/2014   Hyperlipemia    Hyperlipidemia    Hypertension    Hypothyroidism    Long term (current) use of anticoagulants 03/21/2014   Mild CAD 02/20/2015   Overview:  On recent cardiac cath  Overview:  On recent cardiac cath   Myocardial infarction [facility])    "one dr says yes; another says no"   Neuromuscular disorder ([place])    rls, neuropathy   Neuropathy    rls, neuropathy    Normal coronary arteries 2011 11/29/2014   Obesity (BMI 30-39.9)    On home oxygen therapy    "2L w/CPAP at bedtime" (09/01/2018)   OSA on CPAP    cpap & oxygen   Pericardial effusion    Peripheral vascular disease (HCC)    Peritoneal effusion, chronic 02/22/2015   Overview:  With surgical window   PNA (pneumonia) 04/10/2015   Pneumonia 5/15   "several times" (01/18/2014)   Precordial pain 01/26/2015   Preoperative cardiovascular examination 02/24/2016   Primary osteoarthritis of both knees 03/18/2017   Overview:  Added automatically from request for surgery [id number]   Restless leg syndrome    Sleep apnea    cpap & oxygen    Stroke [facility]) 2012   denies residual on 01/18/2014   Troponin level elevated 11/29/2014   Uncontrolled type 2 diabetes mellitus with diabetic polyneuropathy, with long-term current use of insulin ([place]) 12/21/2013   type ?    Past Surgical History:  Procedure Laterality Date   ABDOMINAL AORTOGRAM W/LOWER EXTREMITY N/A 09/08/2018   Procedure: ABDOMINAL AORTOGRAM W/LOWER EXTREMITY;  Surgeon: Marty Heck, MD;  Location: [facility];  Service:  Laterality: N/A;  °• AMPUTATION Right 09/09/2018  ° Procedure: AMPUTATION RIGHT GREAT TOE;  Surgeon: Cain, Brandon  Christopher, MD;  Location: [facility];  Service: Vascular;  Laterality: Right;  °• AMPUTATION TOE Right 05/04/2020  ° Procedure: AMPUTATION TOE RIGHT 2nd TOE;  Surgeon: Price, Michael J, DPM;  Location: [facility];  Service: Podiatry;  Laterality: Right;  °• APPENDECTOMY    °• CARDIAC CATHETERIZATION  X 2 then 03/03/2012  °  NL LVF, normal coronaries, vessels are small ([facility]: Dr. McGukin)  °• CATARACT EXTRACTION W/ INTRAOCULAR LENS  IMPLANT, BILATERAL Bilateral   °• CHOLECYSTECTOMY    °• HERNIA REPAIR    ° UHR  °• LAPAROSCOPIC GASTRIC BANDING  2010  °• LOWER EXTREMITY ANGIOGRAPHY Left 10/11/2018  ° Procedure: LOWER EXTREMITY ANGIOGRAPHY;  Surgeon: Cain, Brandon Christopher, MD;  Location: [facility];  Service: Cardiovascular;  Laterality: Left;  °• PERICARDIAL WINDOW Left 01/24/2014  ° Procedure: PERICARDIAL WINDOW;  Surgeon: Bryan K Bartle, MD;  Location: [facility];  Service: Thoracic;  Laterality: Left;  °• PERIPHERAL VASCULAR INTERVENTION Right 09/08/2018  ° Procedure: PERIPHERAL VASCULAR INTERVENTION;  Surgeon: Clark, Christopher J, MD;  Location[facility];  Service: Cardiovascular;  Laterality: Right;  °• SINUS EXPLORATION  X 2  °• TEE WITHOUT CARDIOVERSION N/A 09/07/2018  ° Procedure: TRANSESOPHAGEAL ECHOCARDIOGRAM (TEE);  Surgeon: Nahser, Philip J, MD;  Location: [facility];  Service: Cardiovascular;  Laterality: N/A;  °• UMBILICAL HERNIA REPAIR    °• VIDEO ASSISTED THORACOSCOPY Left 01/24/2014  ° Procedure: VIDEO ASSISTED THORACOSCOPY;  Surgeon: Bryan K Bartle, MD;  Location: [facility];  Service: Thoracic;  Laterality: Left;  VATS/open lung biopsy  °• VIDEO BRONCHOSCOPY Bilateral 12/29/2013  ° Procedure: VIDEO BRONCHOSCOPY WITHOUT FLUORO;  Surgeon: Patrick E Stamant, MD;  Location: [facility];  Service: Endoscopy;  Laterality: Bilateral;  °• VIDEO BRONCHOSCOPY N/A 01/24/2014  ° Procedure: VIDEO BRONCHOSCOPY;  Surgeon: Bryan K Bartle, MD;  Location: [facility];  Service: Thoracic;  Laterality: N/A;  °  °Family History    °Problem Relation Age of Onset  °• Cancer Mother   °• Stroke Father   °• Heart disease Father   °• Seizures Sister   °• Diabetes Sister   °• Anesthesia problems Neg Hx   °• Hypotension Neg Hx   °• Malignant hyperthermia Neg Hx   °• Pseudochol deficiency Neg Hx   ° °Social History  ° °Socioeconomic History  °• Marital status: Widowed  °  Spouse name: Not on file  °• Number of children: 3  °• Years of education: Not on file  °• Highest education level: Not on file  °Occupational History  °• Not on file  °Tobacco Use  °• Smoking status: Former Smoker  °  Types: Cigars  °  Quit date: 09/01/2006  °  Years since quitting: 13.8  °• Smokeless tobacco: Never Used  °Vaping Use  °• Vaping Use: Never used  °Substance and Sexual Activity  °• Alcohol use: No  °  Comment: "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; quit in 2007-2008"  °• Drug use: No  °• Sexual activity: Never  °Other Topics Concern  °• Not on file  °Social History Narrative  °• Not on file  ° °Social Determinants of Health  ° °Financial Resource Strain:   °• Difficulty of Paying Living Expenses: Not on file  °Food Insecurity:   °• Worried About Running Out of Food in the Last Year: Not on file  °• Ran Out of Food in the   in the Last Year: Not on file  Transportation Needs: No Transportation Needs   Lack of Transportation (Medical): No   Lack of Transportation (Non-Medical): No  Physical Activity: Inactive   Days of Exercise per Week: 0 days   Minutes of Exercise per Session: 0 min  Stress:    Feeling of Stress : Not on file  Social Connections:    Frequency of Communication with Friends and Family: Not on file   Frequency of Social Gatherings with Friends and Family: Not on file   Attends Religious Services: Not on file   Active Member of Clubs or Organizations: Not on file   Attends Archivist Meetings: Not on file   Marital Status: Not on file    Review of Systems  Constitutional: Negative for chills, diaphoresis, fatigue and fever.  HENT:  Positive for ear pain. Negative for congestion and sore throat.   Eyes: Positive for visual disturbance.  Respiratory: Positive for shortness of breath (with exertion.). Negative for cough.   Cardiovascular: Negative for chest pain and leg swelling.  Gastrointestinal: Positive for constipation. Negative for abdominal pain, diarrhea, nausea and vomiting.  Endocrine: Positive for polydipsia.  Genitourinary: Negative for dysuria and urgency.  Musculoskeletal: Positive for arthralgias (knees) and back pain. Negative for myalgias.  Neurological: Positive for weakness. Negative for dizziness and headaches.  Psychiatric/Behavioral: Negative for dysphoric mood.     Objective:  BP 128/60    Pulse 78    Temp 98.3 F (36.8 C)    Resp 16    Ht 5' 7" (1.702 m)    Wt 252 lb (114.3 kg)    BMI 39.47 kg/m   BP/Weight 07/05/2020 06/07/2020 88/05/1693  Systolic BP 503 888 -  Diastolic BP 60 68 -  Wt. (Lbs) 252 251 266  BMI 39.47 39.31 41.66    Physical Exam Vitals reviewed.  Constitutional:      Appearance: Normal appearance. He is obese.  HENT:     Right Ear: Tympanic membrane normal.     Left Ear: Tympanic membrane normal.  Neck:     Vascular: No carotid bruit.  Cardiovascular:     Rate and Rhythm: Normal rate. Rhythm irregular.     Pulses: Normal pulses.     Heart sounds: Normal heart sounds.  Pulmonary:     Effort: Pulmonary effort is normal.     Breath sounds: Normal breath sounds.  Abdominal:     General: Abdomen is flat. Bowel sounds are normal.     Palpations: Abdomen is soft. There is no mass.     Tenderness: There is no abdominal tenderness.  Neurological:     Mental Status: He is alert and oriented to person, place, and time.  Psychiatric:        Mood and Affect: Mood normal.        Behavior: Behavior normal.     Lab Results  Component Value Date   WBC 11.9 (H) 05/01/2020   HGB 13.4 05/01/2020   HCT 40.5 05/01/2020   PLT 309 05/01/2020   GLUCOSE 132 (H) 05/01/2020    CHOL 117 12/13/2019   TRIG 124 12/13/2019   HDL 40 12/13/2019   LDLCALC 55 12/13/2019   ALT 9 03/27/2020   AST 11 03/27/2020   NA 143 05/01/2020   K 3.3 (L) 05/01/2020   CL 101 05/01/2020   CREATININE 2.50 (H) 05/01/2020   BUN 50 (H) 05/01/2020   CO2 29 05/01/2020   TSH 1.560 03/27/2020   INR 1.68  HGBA1C 8.8 (H) 03/27/2020  ° ° ° ° °Assessment & Plan:  °Please bring all your medicines to EVERY visit!!! ° °1. Type 2 diabetes mellitus with diabetic neuropathy, with long-term current use of insulin (HCC) °Recommend continue to work on eating healthy diet and exercise. °Await A1C for recommendations °- CBC with Differential/Platelet °- Hemoglobin A1c °- insulin detemir (LEVEMIR FLEXTOUCH) 100 UNIT/ML FlexPen; Inject 30 Units into the skin daily. °- Cardiovascular Risk Assessment ° °2. Diabetic polyneuropathy associated with type 2 diabetes mellitus (HCC) °Recommend continue to work on eating healthy diet and exercise. °Await A1C for recommendations °- insulin detemir (LEVEMIR FLEXTOUCH) 100 UNIT/ML FlexPen; Inject 30 Units into the skin daily. °- pregabalin (LYRICA) 75 MG capsule; Take 1 capsule (75 mg total) by mouth 2 (two) times daily.  Dispense: 180 capsule; Refill: 0 ° °3. Diabetic glomerulopathy (HCC) °Recommend continue to work on eating healthy diet and exercise. °Await A1C for recommendations ° °4. Mixed hyperlipidemia °Well controlled except hdl too low usually.  °No changes to medicines.  °Continue to work on eating a healthy diet and exercise.  °Labs drawn today.  °- Lipid panel ° °5. Other specified hypothyroidism °The current medical regimen is effective;  continue present plan and medications. ° °6. Hypertensive heart disease with heart failure (HCC) °The current medical regimen is effective;  continue present plan and medications. °Management per specialist.  Dr. Hilty °- Comprehensive metabolic panel °- metolazone (ZAROXOLYN) 2.5 MG tablet; Take 1 tablet (2.5 mg total) by  mouth daily. 3 hours prior to torsemide  Dispense: 36 tablet; Refill: 0 ° °7. High priority for COVID-19 vaccination °8. Encounter for immunization °- Pfizer SARS-COV-2 Vaccine ° °9. PVD (peripheral vascular disease) (HCC) °The current medical regimen is effective;  continue present plan and medications. °Follow up with Podiatry. ° °10. Chronic atrial fibrillation (HCC) °The current medical regimen is effective;  continue present plan and medications. Management per specialist.  Dr. Hilty ° °11. Chronic respiratory failure with hypoxia (HCC) °Continue oxygen at 2 L at night.  °Management per specialist.  Dr. Auxier ° °12. Chronic obstructive bronchitis (HCC)  °The current medical regimen is effective;  continue present plan and medications. °Management per specialist.  Dr. Fitton ° °13. Gastroesophageal reflux disease without esophagitis °The current medical regimen is effective;  continue present plan and medications. ° °14. Chronic pain syndrome °The current medical regimen is somewhat effective;  continue present plan and medications.  Unable to take nsaids due to CKD and blood thinners. Poor surgical candidate.  ° °15. Uncomplicated opioid dependence (HCC) °Only complication is drug induced constipation ° °16. Drug induced constipation °The current medical regimen is effective;  continue present plan and medications. ° °17. Frequent falls ° Called [facility] for PT/OT to see him for balance. Skilled nursing is already coming out.  °STOP FALLING DOWN! °Please check bp and sugar when you fall down. ° °18. Rt great toe amputation ° Management per specialist.  Dr. Price ° °19. OSA on cpap. °Management per specialist.  Dr. Soth ° °20. Morbid obesity with bmi 39 with numerous comorbidities.  °Recommend continue to work on eating healthy diet - 3 meals per day, high protein, low fat, low salt, and low sugar.  ° °21. CKD stage 4.  °Management per specialist.  Dr. Goldsborough ° ° °Follow-up: Return in about  3 months (around 10/05/2020) for fasting. ° °An After Visit Summary was printed and given to the patient. ° °  ° Family Practice °[phone] °

## 2020-07-05 ENCOUNTER — Other Ambulatory Visit: Payer: Self-pay

## 2020-07-05 ENCOUNTER — Encounter: Payer: Self-pay | Admitting: Family Medicine

## 2020-07-05 ENCOUNTER — Ambulatory Visit (INDEPENDENT_AMBULATORY_CARE_PROVIDER_SITE_OTHER): Payer: Medicare Other | Admitting: Podiatry

## 2020-07-05 ENCOUNTER — Encounter: Payer: Self-pay | Admitting: Podiatry

## 2020-07-05 ENCOUNTER — Ambulatory Visit (INDEPENDENT_AMBULATORY_CARE_PROVIDER_SITE_OTHER): Payer: Medicare Other | Admitting: Family Medicine

## 2020-07-05 VITALS — BP 128/60 | HR 78 | Temp 98.3°F | Resp 16 | Ht 67.0 in | Wt 252.0 lb

## 2020-07-05 DIAGNOSIS — I5032 Chronic diastolic (congestive) heart failure: Secondary | ICD-10-CM

## 2020-07-05 DIAGNOSIS — Z89619 Acquired absence of unspecified leg above knee: Secondary | ICD-10-CM | POA: Diagnosis not present

## 2020-07-05 DIAGNOSIS — E1121 Type 2 diabetes mellitus with diabetic nephropathy: Secondary | ICD-10-CM

## 2020-07-05 DIAGNOSIS — E782 Mixed hyperlipidemia: Secondary | ICD-10-CM | POA: Diagnosis not present

## 2020-07-05 DIAGNOSIS — E1142 Type 2 diabetes mellitus with diabetic polyneuropathy: Secondary | ICD-10-CM

## 2020-07-05 DIAGNOSIS — E1169 Type 2 diabetes mellitus with other specified complication: Secondary | ICD-10-CM | POA: Diagnosis not present

## 2020-07-05 DIAGNOSIS — I482 Chronic atrial fibrillation, unspecified: Secondary | ICD-10-CM

## 2020-07-05 DIAGNOSIS — N184 Chronic kidney disease, stage 4 (severe): Secondary | ICD-10-CM

## 2020-07-05 DIAGNOSIS — E1140 Type 2 diabetes mellitus with diabetic neuropathy, unspecified: Secondary | ICD-10-CM

## 2020-07-05 DIAGNOSIS — I739 Peripheral vascular disease, unspecified: Secondary | ICD-10-CM

## 2020-07-05 DIAGNOSIS — E038 Other specified hypothyroidism: Secondary | ICD-10-CM | POA: Diagnosis not present

## 2020-07-05 DIAGNOSIS — I110 Hypertensive heart disease with heart failure: Secondary | ICD-10-CM | POA: Diagnosis not present

## 2020-07-05 DIAGNOSIS — B351 Tinea unguium: Secondary | ICD-10-CM

## 2020-07-05 DIAGNOSIS — Z6839 Body mass index (BMI) 39.0-39.9, adult: Secondary | ICD-10-CM

## 2020-07-05 DIAGNOSIS — Z794 Long term (current) use of insulin: Secondary | ICD-10-CM

## 2020-07-05 DIAGNOSIS — R296 Repeated falls: Secondary | ICD-10-CM

## 2020-07-05 DIAGNOSIS — Z23 Encounter for immunization: Secondary | ICD-10-CM

## 2020-07-05 DIAGNOSIS — E11621 Type 2 diabetes mellitus with foot ulcer: Secondary | ICD-10-CM

## 2020-07-05 DIAGNOSIS — F1120 Opioid dependence, uncomplicated: Secondary | ICD-10-CM

## 2020-07-05 DIAGNOSIS — K5903 Drug induced constipation: Secondary | ICD-10-CM

## 2020-07-05 DIAGNOSIS — Z9989 Dependence on other enabling machines and devices: Secondary | ICD-10-CM

## 2020-07-05 DIAGNOSIS — G4733 Obstructive sleep apnea (adult) (pediatric): Secondary | ICD-10-CM

## 2020-07-05 DIAGNOSIS — J449 Chronic obstructive pulmonary disease, unspecified: Secondary | ICD-10-CM

## 2020-07-05 DIAGNOSIS — L97511 Non-pressure chronic ulcer of other part of right foot limited to breakdown of skin: Secondary | ICD-10-CM | POA: Diagnosis not present

## 2020-07-05 DIAGNOSIS — J9611 Chronic respiratory failure with hypoxia: Secondary | ICD-10-CM

## 2020-07-05 DIAGNOSIS — Z110 Encounter for screening for intestinal infectious diseases: Secondary | ICD-10-CM | POA: Insufficient documentation

## 2020-07-05 DIAGNOSIS — S98111A Complete traumatic amputation of right great toe, initial encounter: Secondary | ICD-10-CM

## 2020-07-05 DIAGNOSIS — G894 Chronic pain syndrome: Secondary | ICD-10-CM

## 2020-07-05 DIAGNOSIS — K219 Gastro-esophageal reflux disease without esophagitis: Secondary | ICD-10-CM

## 2020-07-05 MED ORDER — LEVEMIR FLEXTOUCH 100 UNIT/ML ~~LOC~~ SOPN: 30.0000 [IU] | PEN_INJECTOR | Freq: Every day | SUBCUTANEOUS | Status: DC

## 2020-07-05 MED ORDER — PREGABALIN 75 MG PO CAPS: 75.0000 mg | ORAL_CAPSULE | Freq: Two times a day (BID) | ORAL | 0 refills | Status: DC

## 2020-07-05 MED ORDER — METOLAZONE 2.5 MG PO TABS: 2.5000 mg | ORAL_TABLET | Freq: Every day | ORAL | 0 refills | Status: DC

## 2020-07-05 NOTE — Progress Notes (Signed)
   Covid-19 Vaccination Clinic  Name:  Jon [name]    MRN: [id number] DOB: 11/17/1950  07/05/2020  [name] was observed post Covid-19 immunization for 15 minutes without incident. He was provided with Vaccine Information Sheet and instruction to access the V-Safe system.   [name] was instructed to call 911 with any severe reactions post vaccine: Marland Kitchen Difficulty breathing  . Swelling of face and throat  . A fast heartbeat  . A bad rash all over body  . Dizziness and weakness

## 2020-07-05 NOTE — Patient Instructions (Signed)
STOP FALLING DOWN! Please check bp and sugar when you fall down. Please bring all your medicines to EVERY visit!!! Thank you, Have a good Holiday!!

## 2020-07-06 LAB — COMPREHENSIVE METABOLIC PANEL
ALT: 13 IU/L (ref 0–44)
AST: 21 IU/L (ref 0–40)
Albumin/Globulin Ratio: 1.3 (ref 1.2–2.2)
Albumin: 3.7 g/dL (ref 3.7–4.7)
Alkaline Phosphatase: 144 IU/L — ABNORMAL HIGH (ref 44–121)
BUN/Creatinine Ratio: 11 (ref 10–24)
BUN: 24 mg/dL (ref 8–27)
Bilirubin Total: 0.6 mg/dL (ref 0.0–1.2)
CO2: 21 mmol/L (ref 20–29)
Calcium: 8.7 mg/dL (ref 8.6–10.2)
Chloride: 102 mmol/L (ref 96–106)
Creatinine, Ser: 2.28 mg/dL — ABNORMAL HIGH (ref 0.76–1.27)
GFR calc Af Amer: 32 mL/min/{1.73_m2} — ABNORMAL LOW (ref >59)
GFR calc non Af Amer: 28 mL/min/{1.73_m2} — ABNORMAL LOW (ref >59)
Globulin, Total: 2.8 g/dL (ref 1.5–4.5)
Glucose: 88 mg/dL (ref 65–99)
Potassium: 3.7 mmol/L (ref 3.5–5.2)
Sodium: 140 mmol/L (ref 134–144)
Total Protein: 6.5 g/dL (ref 6.0–8.5)

## 2020-07-06 LAB — CBC WITH DIFFERENTIAL/PLATELET
Basophils Absolute: 0.1 10*3/uL (ref 0.0–0.2)
Basos: 1 %
EOS (ABSOLUTE): 0.1 10*3/uL (ref 0.0–0.4)
Eos: 1 %
Hematocrit: 43.3 % (ref 37.5–51.0)
Hemoglobin: 14.6 g/dL (ref 13.0–17.7)
Immature Grans (Abs): 0 10*3/uL (ref 0.0–0.1)
Immature Granulocytes: 1 %
Lymphocytes Absolute: 1.9 10*3/uL (ref 0.7–3.1)
Lymphs: 27 %
MCH: 28.3 pg (ref 26.6–33.0)
MCHC: 33.7 g/dL (ref 31.5–35.7)
MCV: 84 fL (ref 79–97)
Monocytes Absolute: 0.5 10*3/uL (ref 0.1–0.9)
Monocytes: 7 %
Neutrophils Absolute: 4.5 10*3/uL (ref 1.4–7.0)
Neutrophils: 63 %
Platelets: 316 10*3/uL (ref 150–450)
RBC: 5.16 x10E6/uL (ref 4.14–5.80)
RDW: 13.8 % (ref 11.6–15.4)
WBC: 7.1 10*3/uL (ref 3.4–10.8)

## 2020-07-06 LAB — HEMOGLOBIN A1C
Est. average glucose Bld gHb Est-mCnc: 217 mg/dL
Hgb A1c MFr Bld: 9.2 % — ABNORMAL HIGH (ref 4.8–5.6)

## 2020-07-06 LAB — CARDIOVASCULAR RISK ASSESSMENT

## 2020-07-06 LAB — LIPID PANEL
Chol/HDL Ratio: 2.1 ratio (ref 0.0–5.0)
Cholesterol, Total: 82 mg/dL — ABNORMAL LOW (ref 100–199)
HDL: 39 mg/dL — ABNORMAL LOW (ref >39)
LDL Chol Calc (NIH): 32 mg/dL (ref 0–99)
Triglycerides: 39 mg/dL (ref 0–149)
VLDL Cholesterol Cal: 11 mg/dL (ref 5–40)

## 2020-07-08 DIAGNOSIS — L97811 Non-pressure chronic ulcer of other part of right lower leg limited to breakdown of skin: Secondary | ICD-10-CM | POA: Diagnosis not present

## 2020-07-08 DIAGNOSIS — G2581 Restless legs syndrome: Secondary | ICD-10-CM | POA: Diagnosis not present

## 2020-07-08 DIAGNOSIS — I130 Hypertensive heart and chronic kidney disease with heart failure and stage 1 through stage 4 chronic kidney disease, or unspecified chronic kidney disease: Secondary | ICD-10-CM | POA: Diagnosis not present

## 2020-07-08 DIAGNOSIS — E1142 Type 2 diabetes mellitus with diabetic polyneuropathy: Secondary | ICD-10-CM | POA: Diagnosis not present

## 2020-07-08 DIAGNOSIS — G4733 Obstructive sleep apnea (adult) (pediatric): Secondary | ICD-10-CM | POA: Diagnosis not present

## 2020-07-08 DIAGNOSIS — I482 Chronic atrial fibrillation, unspecified: Secondary | ICD-10-CM | POA: Diagnosis not present

## 2020-07-08 DIAGNOSIS — J9620 Acute and chronic respiratory failure, unspecified whether with hypoxia or hypercapnia: Secondary | ICD-10-CM | POA: Diagnosis not present

## 2020-07-08 DIAGNOSIS — Z8673 Personal history of transient ischemic attack (TIA), and cerebral infarction without residual deficits: Secondary | ICD-10-CM | POA: Diagnosis not present

## 2020-07-08 DIAGNOSIS — I872 Venous insufficiency (chronic) (peripheral): Secondary | ICD-10-CM | POA: Diagnosis not present

## 2020-07-08 DIAGNOSIS — E785 Hyperlipidemia, unspecified: Secondary | ICD-10-CM | POA: Diagnosis not present

## 2020-07-08 DIAGNOSIS — M797 Fibromyalgia: Secondary | ICD-10-CM | POA: Diagnosis not present

## 2020-07-08 DIAGNOSIS — Z9981 Dependence on supplemental oxygen: Secondary | ICD-10-CM | POA: Diagnosis not present

## 2020-07-08 DIAGNOSIS — E1122 Type 2 diabetes mellitus with diabetic chronic kidney disease: Secondary | ICD-10-CM | POA: Diagnosis not present

## 2020-07-08 DIAGNOSIS — I25119 Atherosclerotic heart disease of native coronary artery with unspecified angina pectoris: Secondary | ICD-10-CM | POA: Diagnosis not present

## 2020-07-08 DIAGNOSIS — N183 Chronic kidney disease, stage 3 unspecified: Secondary | ICD-10-CM | POA: Diagnosis not present

## 2020-07-08 DIAGNOSIS — I5032 Chronic diastolic (congestive) heart failure: Secondary | ICD-10-CM | POA: Diagnosis not present

## 2020-07-08 DIAGNOSIS — M170 Bilateral primary osteoarthritis of knee: Secondary | ICD-10-CM | POA: Diagnosis not present

## 2020-07-08 DIAGNOSIS — E039 Hypothyroidism, unspecified: Secondary | ICD-10-CM | POA: Diagnosis not present

## 2020-07-08 DIAGNOSIS — I252 Old myocardial infarction: Secondary | ICD-10-CM | POA: Diagnosis not present

## 2020-07-08 DIAGNOSIS — K219 Gastro-esophageal reflux disease without esophagitis: Secondary | ICD-10-CM | POA: Diagnosis not present

## 2020-07-08 DIAGNOSIS — Z9181 History of falling: Secondary | ICD-10-CM | POA: Diagnosis not present

## 2020-07-08 DIAGNOSIS — I824Z2 Acute embolism and thrombosis of unspecified deep veins of left distal lower extremity: Secondary | ICD-10-CM | POA: Diagnosis not present

## 2020-07-08 DIAGNOSIS — E1151 Type 2 diabetes mellitus with diabetic peripheral angiopathy without gangrene: Secondary | ICD-10-CM | POA: Diagnosis not present

## 2020-07-08 DIAGNOSIS — J449 Chronic obstructive pulmonary disease, unspecified: Secondary | ICD-10-CM | POA: Diagnosis not present

## 2020-07-09 ENCOUNTER — Ambulatory Visit: Payer: Medicare Other

## 2020-07-09 ENCOUNTER — Other Ambulatory Visit: Payer: Self-pay

## 2020-07-09 ENCOUNTER — Other Ambulatory Visit: Payer: Self-pay | Admitting: Family Medicine

## 2020-07-09 DIAGNOSIS — L97811 Non-pressure chronic ulcer of other part of right lower leg limited to breakdown of skin: Secondary | ICD-10-CM | POA: Diagnosis not present

## 2020-07-09 DIAGNOSIS — M170 Bilateral primary osteoarthritis of knee: Secondary | ICD-10-CM | POA: Diagnosis not present

## 2020-07-09 DIAGNOSIS — G4733 Obstructive sleep apnea (adult) (pediatric): Secondary | ICD-10-CM | POA: Diagnosis not present

## 2020-07-09 DIAGNOSIS — I872 Venous insufficiency (chronic) (peripheral): Secondary | ICD-10-CM | POA: Diagnosis not present

## 2020-07-09 DIAGNOSIS — I130 Hypertensive heart and chronic kidney disease with heart failure and stage 1 through stage 4 chronic kidney disease, or unspecified chronic kidney disease: Secondary | ICD-10-CM | POA: Diagnosis not present

## 2020-07-09 DIAGNOSIS — I25119 Atherosclerotic heart disease of native coronary artery with unspecified angina pectoris: Secondary | ICD-10-CM | POA: Diagnosis not present

## 2020-07-09 DIAGNOSIS — Z8673 Personal history of transient ischemic attack (TIA), and cerebral infarction without residual deficits: Secondary | ICD-10-CM | POA: Diagnosis not present

## 2020-07-09 DIAGNOSIS — K219 Gastro-esophageal reflux disease without esophagitis: Secondary | ICD-10-CM | POA: Diagnosis not present

## 2020-07-09 DIAGNOSIS — Z9181 History of falling: Secondary | ICD-10-CM | POA: Diagnosis not present

## 2020-07-09 DIAGNOSIS — E039 Hypothyroidism, unspecified: Secondary | ICD-10-CM | POA: Diagnosis not present

## 2020-07-09 DIAGNOSIS — M797 Fibromyalgia: Secondary | ICD-10-CM | POA: Diagnosis not present

## 2020-07-09 DIAGNOSIS — E1142 Type 2 diabetes mellitus with diabetic polyneuropathy: Secondary | ICD-10-CM | POA: Diagnosis not present

## 2020-07-09 DIAGNOSIS — E1151 Type 2 diabetes mellitus with diabetic peripheral angiopathy without gangrene: Secondary | ICD-10-CM | POA: Diagnosis not present

## 2020-07-09 DIAGNOSIS — J449 Chronic obstructive pulmonary disease, unspecified: Secondary | ICD-10-CM | POA: Diagnosis not present

## 2020-07-09 DIAGNOSIS — E785 Hyperlipidemia, unspecified: Secondary | ICD-10-CM | POA: Diagnosis not present

## 2020-07-09 DIAGNOSIS — Z794 Long term (current) use of insulin: Secondary | ICD-10-CM

## 2020-07-09 DIAGNOSIS — I482 Chronic atrial fibrillation, unspecified: Secondary | ICD-10-CM | POA: Diagnosis not present

## 2020-07-09 DIAGNOSIS — E1140 Type 2 diabetes mellitus with diabetic neuropathy, unspecified: Secondary | ICD-10-CM

## 2020-07-09 DIAGNOSIS — I5032 Chronic diastolic (congestive) heart failure: Secondary | ICD-10-CM | POA: Diagnosis not present

## 2020-07-09 DIAGNOSIS — G2581 Restless legs syndrome: Secondary | ICD-10-CM | POA: Diagnosis not present

## 2020-07-09 DIAGNOSIS — E1122 Type 2 diabetes mellitus with diabetic chronic kidney disease: Secondary | ICD-10-CM | POA: Diagnosis not present

## 2020-07-09 DIAGNOSIS — J9620 Acute and chronic respiratory failure, unspecified whether with hypoxia or hypercapnia: Secondary | ICD-10-CM | POA: Diagnosis not present

## 2020-07-09 DIAGNOSIS — N183 Chronic kidney disease, stage 3 unspecified: Secondary | ICD-10-CM | POA: Diagnosis not present

## 2020-07-09 DIAGNOSIS — I824Z2 Acute embolism and thrombosis of unspecified deep veins of left distal lower extremity: Secondary | ICD-10-CM | POA: Diagnosis not present

## 2020-07-09 DIAGNOSIS — I252 Old myocardial infarction: Secondary | ICD-10-CM | POA: Diagnosis not present

## 2020-07-09 DIAGNOSIS — Z9981 Dependence on supplemental oxygen: Secondary | ICD-10-CM | POA: Diagnosis not present

## 2020-07-09 NOTE — Progress Notes (Signed)
Patient informed of blood work results via phone. Acknowledges understanding. Patient states that he hasn't been giving mealtime insulin due to forgetting. Patient reports that he has also not received CGM at this time. Pharmacist to contact company to determine delay.

## 2020-07-09 NOTE — Progress Notes (Signed)
  Subjective:  Patient ID: Jon [name], male    DOB: 10-03-1950,  MRN: [id number]  Chief Complaint  Patient presents with  . Nail Problem    trim nails   . Routine Post Op    the right foot is doing ok and just want Dr March Rummage to see it     DOS: 05/04/20 Procedure: Right 2nd toe amputation - MPJ level  69 y.o. male presents for follow up. Requests care of his nails. States the amputation healed well and denies issues. Objective:  Physical Exam: no tenderness at the surgical site and local edema noted. Incision: well healed amputation site.  Onychomycosis of remainder of toenails with thickened and dystrophic.  Assessment:   1. Diabetic ulcer of toe of right foot associated with type 2 diabetes mellitus, limited to breakdown of skin ([place])   2. Onychomycosis   3. History of amputation of lower extremity associated with diabetes mellitus ([place])     Plan:  Patient was evaluated and treated and all questions answered.  Post-operative State -Well healed  Diabetes with amputation hx, Onychomycosis -Educated on diabetic footcare. Diabetic risk level 3 -Nails x8 debrided sharply and manually with large nail nipper and rotary burr.   No follow-ups on file.

## 2020-07-09 NOTE — Chronic Care Management (AMB) (Signed)
Spoke with [name] to review recent lab results.   Patient reports that he has not been giving his mealtime insulin at this time (maybe 2 -3 times total) due to forgetting. Discussed setting a reminder or alarm at mealtimes. Patient reports that meals are inconsistently timed and doesn't have support to help remind him. Patient is going to try to remember and give original sliding scale doses.   Patient's home blood sugar readings for the past few days:   AM: 189. 143. 120, 100, 96  PM: 333, 333, 247, 235  Patient has not received his CGM at this time. Pharmacist will reach out to determine reason.   Patient states home health came out again and wants [name] to have injections in his knees to improve his mobility. [name] is hoping Dr. Tobie Poet can assist with ordering this.   Sherre Poot, PharmD, [facility] Clinical Pharmacist [facility] [phone] (office) [phone] (mobile)

## 2020-07-10 ENCOUNTER — Other Ambulatory Visit: Payer: Self-pay | Admitting: Family Medicine

## 2020-07-10 DIAGNOSIS — Z9181 History of falling: Secondary | ICD-10-CM | POA: Diagnosis not present

## 2020-07-10 DIAGNOSIS — I824Z2 Acute embolism and thrombosis of unspecified deep veins of left distal lower extremity: Secondary | ICD-10-CM | POA: Diagnosis not present

## 2020-07-10 DIAGNOSIS — Z8673 Personal history of transient ischemic attack (TIA), and cerebral infarction without residual deficits: Secondary | ICD-10-CM | POA: Diagnosis not present

## 2020-07-10 DIAGNOSIS — N183 Chronic kidney disease, stage 3 unspecified: Secondary | ICD-10-CM | POA: Diagnosis not present

## 2020-07-10 DIAGNOSIS — J9620 Acute and chronic respiratory failure, unspecified whether with hypoxia or hypercapnia: Secondary | ICD-10-CM | POA: Diagnosis not present

## 2020-07-10 DIAGNOSIS — I25119 Atherosclerotic heart disease of native coronary artery with unspecified angina pectoris: Secondary | ICD-10-CM | POA: Diagnosis not present

## 2020-07-10 DIAGNOSIS — L97811 Non-pressure chronic ulcer of other part of right lower leg limited to breakdown of skin: Secondary | ICD-10-CM | POA: Diagnosis not present

## 2020-07-10 DIAGNOSIS — E1122 Type 2 diabetes mellitus with diabetic chronic kidney disease: Secondary | ICD-10-CM | POA: Diagnosis not present

## 2020-07-10 DIAGNOSIS — M170 Bilateral primary osteoarthritis of knee: Secondary | ICD-10-CM | POA: Diagnosis not present

## 2020-07-10 DIAGNOSIS — J449 Chronic obstructive pulmonary disease, unspecified: Secondary | ICD-10-CM | POA: Diagnosis not present

## 2020-07-10 DIAGNOSIS — K219 Gastro-esophageal reflux disease without esophagitis: Secondary | ICD-10-CM | POA: Diagnosis not present

## 2020-07-10 DIAGNOSIS — E1142 Type 2 diabetes mellitus with diabetic polyneuropathy: Secondary | ICD-10-CM | POA: Diagnosis not present

## 2020-07-10 DIAGNOSIS — I872 Venous insufficiency (chronic) (peripheral): Secondary | ICD-10-CM | POA: Diagnosis not present

## 2020-07-10 DIAGNOSIS — I130 Hypertensive heart and chronic kidney disease with heart failure and stage 1 through stage 4 chronic kidney disease, or unspecified chronic kidney disease: Secondary | ICD-10-CM | POA: Diagnosis not present

## 2020-07-10 DIAGNOSIS — G2581 Restless legs syndrome: Secondary | ICD-10-CM | POA: Diagnosis not present

## 2020-07-10 DIAGNOSIS — G4733 Obstructive sleep apnea (adult) (pediatric): Secondary | ICD-10-CM | POA: Diagnosis not present

## 2020-07-10 DIAGNOSIS — I482 Chronic atrial fibrillation, unspecified: Secondary | ICD-10-CM | POA: Diagnosis not present

## 2020-07-10 DIAGNOSIS — I252 Old myocardial infarction: Secondary | ICD-10-CM | POA: Diagnosis not present

## 2020-07-10 DIAGNOSIS — E039 Hypothyroidism, unspecified: Secondary | ICD-10-CM | POA: Diagnosis not present

## 2020-07-10 DIAGNOSIS — E1151 Type 2 diabetes mellitus with diabetic peripheral angiopathy without gangrene: Secondary | ICD-10-CM | POA: Diagnosis not present

## 2020-07-10 DIAGNOSIS — M797 Fibromyalgia: Secondary | ICD-10-CM | POA: Diagnosis not present

## 2020-07-10 DIAGNOSIS — E785 Hyperlipidemia, unspecified: Secondary | ICD-10-CM | POA: Diagnosis not present

## 2020-07-10 DIAGNOSIS — Z9981 Dependence on supplemental oxygen: Secondary | ICD-10-CM | POA: Diagnosis not present

## 2020-07-10 DIAGNOSIS — I5032 Chronic diastolic (congestive) heart failure: Secondary | ICD-10-CM | POA: Diagnosis not present

## 2020-07-11 LAB — HM DIABETES EYE EXAM

## 2020-07-13 ENCOUNTER — Other Ambulatory Visit: Payer: Self-pay | Admitting: Physician Assistant

## 2020-07-15 DIAGNOSIS — L97811 Non-pressure chronic ulcer of other part of right lower leg limited to breakdown of skin: Secondary | ICD-10-CM | POA: Diagnosis not present

## 2020-07-15 DIAGNOSIS — I872 Venous insufficiency (chronic) (peripheral): Secondary | ICD-10-CM | POA: Diagnosis not present

## 2020-07-15 DIAGNOSIS — K219 Gastro-esophageal reflux disease without esophagitis: Secondary | ICD-10-CM | POA: Diagnosis not present

## 2020-07-15 DIAGNOSIS — E039 Hypothyroidism, unspecified: Secondary | ICD-10-CM | POA: Diagnosis not present

## 2020-07-15 DIAGNOSIS — E1142 Type 2 diabetes mellitus with diabetic polyneuropathy: Secondary | ICD-10-CM | POA: Diagnosis not present

## 2020-07-15 DIAGNOSIS — I130 Hypertensive heart and chronic kidney disease with heart failure and stage 1 through stage 4 chronic kidney disease, or unspecified chronic kidney disease: Secondary | ICD-10-CM | POA: Diagnosis not present

## 2020-07-15 DIAGNOSIS — I824Z2 Acute embolism and thrombosis of unspecified deep veins of left distal lower extremity: Secondary | ICD-10-CM | POA: Diagnosis not present

## 2020-07-15 DIAGNOSIS — I25119 Atherosclerotic heart disease of native coronary artery with unspecified angina pectoris: Secondary | ICD-10-CM | POA: Diagnosis not present

## 2020-07-15 DIAGNOSIS — G4733 Obstructive sleep apnea (adult) (pediatric): Secondary | ICD-10-CM | POA: Diagnosis not present

## 2020-07-15 DIAGNOSIS — M797 Fibromyalgia: Secondary | ICD-10-CM | POA: Diagnosis not present

## 2020-07-15 DIAGNOSIS — I5032 Chronic diastolic (congestive) heart failure: Secondary | ICD-10-CM | POA: Diagnosis not present

## 2020-07-15 DIAGNOSIS — E1151 Type 2 diabetes mellitus with diabetic peripheral angiopathy without gangrene: Secondary | ICD-10-CM | POA: Diagnosis not present

## 2020-07-15 DIAGNOSIS — E785 Hyperlipidemia, unspecified: Secondary | ICD-10-CM | POA: Diagnosis not present

## 2020-07-15 DIAGNOSIS — Z9981 Dependence on supplemental oxygen: Secondary | ICD-10-CM | POA: Diagnosis not present

## 2020-07-15 DIAGNOSIS — Z8673 Personal history of transient ischemic attack (TIA), and cerebral infarction without residual deficits: Secondary | ICD-10-CM | POA: Diagnosis not present

## 2020-07-15 DIAGNOSIS — G2581 Restless legs syndrome: Secondary | ICD-10-CM | POA: Diagnosis not present

## 2020-07-15 DIAGNOSIS — N183 Chronic kidney disease, stage 3 unspecified: Secondary | ICD-10-CM | POA: Diagnosis not present

## 2020-07-15 DIAGNOSIS — I252 Old myocardial infarction: Secondary | ICD-10-CM | POA: Diagnosis not present

## 2020-07-15 DIAGNOSIS — I482 Chronic atrial fibrillation, unspecified: Secondary | ICD-10-CM | POA: Diagnosis not present

## 2020-07-15 DIAGNOSIS — M170 Bilateral primary osteoarthritis of knee: Secondary | ICD-10-CM | POA: Diagnosis not present

## 2020-07-15 DIAGNOSIS — J449 Chronic obstructive pulmonary disease, unspecified: Secondary | ICD-10-CM | POA: Diagnosis not present

## 2020-07-15 DIAGNOSIS — Z9181 History of falling: Secondary | ICD-10-CM | POA: Diagnosis not present

## 2020-07-15 DIAGNOSIS — J9620 Acute and chronic respiratory failure, unspecified whether with hypoxia or hypercapnia: Secondary | ICD-10-CM | POA: Diagnosis not present

## 2020-07-15 DIAGNOSIS — E1122 Type 2 diabetes mellitus with diabetic chronic kidney disease: Secondary | ICD-10-CM | POA: Diagnosis not present

## 2020-07-16 ENCOUNTER — Telehealth: Payer: Self-pay

## 2020-07-16 DIAGNOSIS — J9620 Acute and chronic respiratory failure, unspecified whether with hypoxia or hypercapnia: Secondary | ICD-10-CM | POA: Diagnosis not present

## 2020-07-16 DIAGNOSIS — Z9981 Dependence on supplemental oxygen: Secondary | ICD-10-CM | POA: Diagnosis not present

## 2020-07-16 DIAGNOSIS — I482 Chronic atrial fibrillation, unspecified: Secondary | ICD-10-CM | POA: Diagnosis not present

## 2020-07-16 DIAGNOSIS — E1142 Type 2 diabetes mellitus with diabetic polyneuropathy: Secondary | ICD-10-CM | POA: Diagnosis not present

## 2020-07-16 DIAGNOSIS — I824Z2 Acute embolism and thrombosis of unspecified deep veins of left distal lower extremity: Secondary | ICD-10-CM | POA: Diagnosis not present

## 2020-07-16 DIAGNOSIS — E785 Hyperlipidemia, unspecified: Secondary | ICD-10-CM | POA: Diagnosis not present

## 2020-07-16 DIAGNOSIS — I5032 Chronic diastolic (congestive) heart failure: Secondary | ICD-10-CM | POA: Diagnosis not present

## 2020-07-16 DIAGNOSIS — K219 Gastro-esophageal reflux disease without esophagitis: Secondary | ICD-10-CM | POA: Diagnosis not present

## 2020-07-16 DIAGNOSIS — L97811 Non-pressure chronic ulcer of other part of right lower leg limited to breakdown of skin: Secondary | ICD-10-CM | POA: Diagnosis not present

## 2020-07-16 DIAGNOSIS — Z8673 Personal history of transient ischemic attack (TIA), and cerebral infarction without residual deficits: Secondary | ICD-10-CM | POA: Diagnosis not present

## 2020-07-16 DIAGNOSIS — E039 Hypothyroidism, unspecified: Secondary | ICD-10-CM | POA: Diagnosis not present

## 2020-07-16 DIAGNOSIS — E1122 Type 2 diabetes mellitus with diabetic chronic kidney disease: Secondary | ICD-10-CM | POA: Diagnosis not present

## 2020-07-16 DIAGNOSIS — I872 Venous insufficiency (chronic) (peripheral): Secondary | ICD-10-CM | POA: Diagnosis not present

## 2020-07-16 DIAGNOSIS — I130 Hypertensive heart and chronic kidney disease with heart failure and stage 1 through stage 4 chronic kidney disease, or unspecified chronic kidney disease: Secondary | ICD-10-CM | POA: Diagnosis not present

## 2020-07-16 DIAGNOSIS — N183 Chronic kidney disease, stage 3 unspecified: Secondary | ICD-10-CM | POA: Diagnosis not present

## 2020-07-16 DIAGNOSIS — I252 Old myocardial infarction: Secondary | ICD-10-CM | POA: Diagnosis not present

## 2020-07-16 DIAGNOSIS — E1151 Type 2 diabetes mellitus with diabetic peripheral angiopathy without gangrene: Secondary | ICD-10-CM | POA: Diagnosis not present

## 2020-07-16 DIAGNOSIS — I25119 Atherosclerotic heart disease of native coronary artery with unspecified angina pectoris: Secondary | ICD-10-CM | POA: Diagnosis not present

## 2020-07-16 DIAGNOSIS — G4733 Obstructive sleep apnea (adult) (pediatric): Secondary | ICD-10-CM | POA: Diagnosis not present

## 2020-07-16 DIAGNOSIS — M797 Fibromyalgia: Secondary | ICD-10-CM | POA: Diagnosis not present

## 2020-07-16 DIAGNOSIS — J449 Chronic obstructive pulmonary disease, unspecified: Secondary | ICD-10-CM | POA: Diagnosis not present

## 2020-07-16 DIAGNOSIS — Z9181 History of falling: Secondary | ICD-10-CM | POA: Diagnosis not present

## 2020-07-16 DIAGNOSIS — M170 Bilateral primary osteoarthritis of knee: Secondary | ICD-10-CM | POA: Diagnosis not present

## 2020-07-16 DIAGNOSIS — G2581 Restless legs syndrome: Secondary | ICD-10-CM | POA: Diagnosis not present

## 2020-07-16 NOTE — Telephone Encounter (Signed)
Gretchen with [facility] PT called requesting that we see patient in office. Reports having discharged patient yesterday due to him not being willing to do anything. Expressed that patient has classic signs of depression. Patient stated to her that he would rather be in the ground with his wife. Called patient and scheduled a appointment for tomorrow.

## 2020-07-17 ENCOUNTER — Other Ambulatory Visit: Payer: Self-pay

## 2020-07-17 ENCOUNTER — Ambulatory Visit (INDEPENDENT_AMBULATORY_CARE_PROVIDER_SITE_OTHER): Payer: Medicare Other | Admitting: Family Medicine

## 2020-07-17 ENCOUNTER — Encounter: Payer: Self-pay | Admitting: Family Medicine

## 2020-07-17 ENCOUNTER — Telehealth: Payer: Self-pay

## 2020-07-17 VITALS — BP 125/80 | HR 91 | Temp 98.1°F | Ht 67.0 in | Wt 249.6 lb

## 2020-07-17 DIAGNOSIS — E1140 Type 2 diabetes mellitus with diabetic neuropathy, unspecified: Secondary | ICD-10-CM | POA: Diagnosis not present

## 2020-07-17 DIAGNOSIS — Z794 Long term (current) use of insulin: Secondary | ICD-10-CM

## 2020-07-17 DIAGNOSIS — I739 Peripheral vascular disease, unspecified: Secondary | ICD-10-CM

## 2020-07-17 DIAGNOSIS — F330 Major depressive disorder, recurrent, mild: Secondary | ICD-10-CM | POA: Diagnosis not present

## 2020-07-17 DIAGNOSIS — L299 Pruritus, unspecified: Secondary | ICD-10-CM

## 2020-07-17 DIAGNOSIS — L97911 Non-pressure chronic ulcer of unspecified part of right lower leg limited to breakdown of skin: Secondary | ICD-10-CM

## 2020-07-17 MED ORDER — LEVOCETIRIZINE DIHYDROCHLORIDE 5 MG PO TABS: 5.0000 mg | ORAL_TABLET | Freq: Every evening | ORAL | 0 refills | Status: DC

## 2020-07-17 MED ORDER — SERTRALINE HCL 50 MG PO TABS: 50.0000 mg | ORAL_TABLET | Freq: Every day | ORAL | 1 refills | Status: DC

## 2020-07-17 NOTE — Progress Notes (Signed)
Subjective:  Patient ID: Jon [name], male    DOB: 09/09/1950  Age: 69 y.o. MRN: [id number]  Chief Complaint  Patient presents with  . Depression    HPI  Patient states he had been feeling depressed for the past month. PHQ 9 = 8. Experiencing anhedonia, depression, fatigue and decreased appetite. Requesting medicine.   Patient also states his legs hurt and itch due to the sores that are there. Wound care has come out and dressing them but not anymore. Status post amputation of rt great toe. His great toe has healed well. No fever. Patient says he was very frustrated by home health care. Nursing released him on Sunday. The only branch of home health care that is still seeing him is [facility].  The patient is unable to bathe and shower himself.   Current Outpatient Medications on File Prior to Visit  Medication Sig Dispense Refill  . albuterol (PROVENTIL) (2.5 MG/3ML) 0.083% nebulizer solution Take 3 mLs (2.5 mg total) by nebulization 2 (two) times daily. And as needed (Patient taking differently: Take 2.5 mg by nebulization 2 (two) times daily as needed for wheezing. ) 75 mL 5  . atorvastatin (LIPITOR) 40 MG tablet TAKE 1 TABLET BY MOUTH ONCE DAILY 90 tablet 1  . Azelastine HCl 0.15 % SOLN Place 2 sprays into both nostrils daily.     . BD PEN NEEDLE NANO 2ND GEN 32G X 4 MM MISC USE DAILY WITH BASAGLAR 100 each 3  . clopidogrel (PLAVIX) 75 MG tablet TAKE 1 TABLET BY MOUTH DAILY 30 tablet 5  . Continuous Blood Gluc Receiver (DEXCOM G6 RECEIVER) DEVI 1 each by Does not apply route 4 (four) times daily -  before meals and at bedtime. 1 each 0  . Continuous Blood Gluc Sensor (DEXCOM G6 SENSOR) MISC Apply every 10 days 3 each 3  . diltiazem (CARDIZEM CD) 120 MG 24 hr capsule TAKE 1 CAPSULE BY MOUTH DAILY 90 capsule 1  . docusate sodium (COLACE) 100 MG capsule Take 100 mg by mouth 2 (two) times daily.    . famotidine (PEPCID) 20 MG tablet TAKE 2 TABLETS BY MOUTH TWICE DAILY 120 tablet 5  .  ferrous sulfate 325 (65 FE) MG tablet Take 325 mg by mouth daily.    . Glucagon, rDNA, (GLUCAGON EMERGENCY) 1 MG KIT Inject 1 mg as directed as needed (low blood sugar).     . hydrALAZINE (APRESOLINE) 100 MG tablet Take 50 mg by mouth 2 (two) times daily.    . insulin detemir (LEVEMIR FLEXTOUCH) 100 UNIT/ML FlexPen Inject 30 Units into the skin daily.    . insulin lispro (HUMALOG) 100 UNIT/ML cartridge SSI ranging up 60 U 15 mL 2  . isosorbide mononitrate (IMDUR) 120 MG 24 hr tablet TAKE 1 TABLET BY MOUTH ONCE DAILY IN THEMORNINGS 90 tablet 1  . lactulose (CHRONULAC) 10 GM/15ML solution TAKE 30MLS BY MOUTH IN THE MORNING AND AT BEDTIME 946 mL 2  . metolazone (ZAROXOLYN) 2.5 MG tablet Take 1 tablet (2.5 mg total) by mouth daily. 3 hours prior to torsemide 36 tablet 0  . mometasone-formoterol (DULERA) 200-5 MCG/ACT AERO Inhale 2 puffs into the lungs 2 (two) times daily as needed for wheezing.    . nitroGLYCERIN (NITROSTAT) 0.4 MG SL tablet Place 0.4 mg under the tongue every 5 (five) minutes as needed for chest pain.     Marland Kitchen oxyCODONE-acetaminophen (PERCOCET/ROXICET) 5-325 MG tablet TAKE 1 TABLET BY MOUTH EVERY 4 HOURS AS NEEDED FOR SEVERE  PAIN 20 tablet 0  . OXYGEN Inhale into the lungs. 2 liters at bedtime    . pantoprazole (PROTONIX) 40 MG tablet Take 40 mg by mouth in the morning.     . Potassium Chloride ER 20 MEQ TBCR Take 40 mEq by mouth 2 (two) times daily.     . potassium chloride SA (KLOR-CON) 20 MEQ tablet TAKE TWO TABLETS BY MOUTH TWICE A DAY 360 tablet 0  . pregabalin (LYRICA) 75 MG capsule Take 1 capsule (75 mg total) by mouth 2 (two) times daily. 180 capsule 0  . RELISTOR 150 MG TABS TAKE 3 TABLETS BY MOUTH EVERY MORNING 90 tablet 0  . Semaglutide, 1 MG/DOSE, (OZEMPIC, 1 MG/DOSE,) 2 MG/1.5ML SOPN Inject 1 mg into the skin once a week. 3 mL 2  . torsemide (DEMADEX) 20 MG tablet TAKE 3 TABLETS BY MOUTH AT LUNCH AND TAKE 3 TABLETS AT SUPPER 540 tablet 1  . Vitamin D, Ergocalciferol,  (DRISDOL) 1.25 MG (50000 UNIT) CAPS capsule TAKE 1 CAPSULE BY MOUTH DAILY 90 capsule 1  . XARELTO 15 MG TABS tablet TAKE 1 TABLET BY MOUTH ONCE DAILY (Patient taking differently: Take 15 mg by mouth at bedtime. ) 90 tablet 2  . XTAMPZA ER 27 MG C12A TAKE 1 CAPSULE BY MOUTH TWICE DAILY 60 capsule 0   No current facility-administered medications on file prior to visit.   Past Medical History:  Diagnosis Date  . Abnormal EKG 11/29/2014  . Acute renal insufficiency 11/29/2014  . Angina   . Arthritis    "knees" (01/18/2014)  . Asthma   . Atrial fibrillation ([place])   . Back pain 01/25/2015  . 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 BOOP (bronchiolitis obliterans with organizing pneumonia) ([place]) 01/20/2014   OLBx 01/20/14 BOOP Started steroids 02/28/14   . CHF (congestive heart failure) ([place])   . Chronic anticoagulation-Xarelto 02/09/2014  . Chronic atrial fibrillation ([place]) 01/03/2014  . Chronic bronchitis ([place]) 12/21/2013  . Chronic diastolic heart failure ([place]) 02/22/2015  . Chronic pain of both knees 03/18/2017   Overview:  Added automatically from request for surgery [id number]  . Chronic respiratory failure ([place]) 04/10/2015  . COPD (chronic obstructive pulmonary disease) ([place]) 01/25/2015  . Cough    coughing up blood- started last nite, seems to be worse now  . CVA (cerebral vascular accident) ([place]) 02/09/2014  . Dysrhythmia    atrial fib takes cardizem,   . Fibromyalgia   . GERD (gastroesophageal reflux disease)   . H/O hiatal hernia   . Hemoptysis 08/01/2015  . Herpes zoster 06/26/2015  . History of stroke   . Hyperkalemia-repeat pending 11/29/2014  . Hyperlipemia   . Hyperlipidemia   . Hypertension   . Hypothyroidism   . Long term (current) use of anticoagulants 03/21/2014  . Mild CAD 02/20/2015   Overview:  On recent cardiac cath  Overview:  On recent cardiac cath  . Myocardial infarction [facility])    "one dr says yes; another says no"  . Neuromuscular disorder (HCC)    rls, neuropathy  .  Neuropathy    rls, neuropathy   . Normal coronary arteries 2011 11/29/2014  . Obesity (BMI 30-39.9)   . On home oxygen therapy    "2L w/CPAP at bedtime" (09/01/2018)  . OSA on CPAP    cpap & oxygen  . Pericardial effusion   . Peripheral vascular disease ([place])   . Peritoneal effusion, chronic 02/22/2015   Overview:  With surgical window  . PNA (pneumonia) 04/10/2015  .  Pneumonia 5/15   "several times" (01/18/2014)  . Precordial pain 01/26/2015  . Preoperative cardiovascular examination 02/24/2016  . Primary osteoarthritis of both knees 03/18/2017   Overview:  Added automatically from request for surgery [id number]  . Restless leg syndrome   . Sleep apnea    cpap & oxygen   . Stroke [facility]) 2012   denies residual on 01/18/2014  . Troponin level elevated 11/29/2014  . Uncontrolled type 2 diabetes mellitus with diabetic polyneuropathy, with long-term current use of insulin ([place]) 12/21/2013   type ?    Past Surgical History:  Procedure Laterality Date  . ABDOMINAL AORTOGRAM W/LOWER EXTREMITY N/A 09/08/2018   Procedure: ABDOMINAL AORTOGRAM W/LOWER EXTREMITY;  Surgeon: Marty Heck, MD;  Location: [facility];  Service: Cardiovascular;  Laterality: N/A;  . AMPUTATION Right 09/09/2018   Procedure: AMPUTATION RIGHT GREAT TOE;  Surgeon: Waynetta Sandy, MD;  Location: [place];  Service: Vascular;  Laterality: Right;  . AMPUTATION TOE Right 05/04/2020   Procedure: AMPUTATION TOE RIGHT 2nd TOE;  Surgeon: Evelina Bucy, DPM;  Location: [facility];  Service: Podiatry;  Laterality: Right;  . APPENDECTOMY    . CARDIAC CATHETERIZATION  X 2 then 03/03/2012    NL LVF, normal coronaries, vessels are small (HPR: Dr. Beatrix Fetters)  . CATARACT EXTRACTION W/ INTRAOCULAR LENS  IMPLANT, BILATERAL Bilateral   . CHOLECYSTECTOMY    . HERNIA REPAIR     UHR  . LAPAROSCOPIC GASTRIC BANDING  2010  . LOWER EXTREMITY ANGIOGRAPHY Left 10/11/2018   Procedure: LOWER EXTREMITY ANGIOGRAPHY;  Surgeon: Waynetta Sandy, MD;  Location: [facility];  Service: Cardiovascular;  Laterality: Left;  . PERICARDIAL WINDOW Left 01/24/2014   Procedure: PERICARDIAL WINDOW;  Surgeon: Gaye Pollack, MD;  Location: [place];  Service: Thoracic;  Laterality: Left;  . PERIPHERAL VASCULAR INTERVENTION Right 09/08/2018   Procedure: PERIPHERAL VASCULAR INTERVENTION;  Surgeon: Marty Heck, MD;  Location: [place] [facility];  Service: Cardiovascular;  Laterality: Right;  . SINUS EXPLORATION  X 2  . TEE WITHOUT CARDIOVERSION N/A 09/07/2018   Procedure: TRANSESOPHAGEAL ECHOCARDIOGRAM (TEE);  Surgeon: Acie Fredrickson Wonda Cheng, MD;  Location: [place] [facility];  Service: Cardiovascular;  Laterality: N/A;  . UMBILICAL HERNIA REPAIR    . VIDEO ASSISTED THORACOSCOPY Left 01/24/2014   Procedure: VIDEO ASSISTED THORACOSCOPY;  Surgeon: Gaye Pollack, MD;  Location: [facility] OR;  Service: Thoracic;  Laterality: Left;  VATS/open lung biopsy  . VIDEO BRONCHOSCOPY Bilateral 12/29/2013   Procedure: VIDEO BRONCHOSCOPY WITHOUT FLUORO;  Surgeon: Elsie Stain, MD;  [facility];  Service: Endoscopy;  Laterality: Bilateral;  . VIDEO BRONCHOSCOPY N/A 01/24/2014   Procedure: VIDEO BRONCHOSCOPY;  Surgeon: Gaye Pollack, MD;  Location: [facility] OR;  Service: Thoracic;  Laterality: N/A;    Family History  Problem Relation Age of Onset  . Cancer Mother   . Stroke Father   . Heart disease Father   . Seizures Sister   . Diabetes Sister   . Anesthesia problems Neg Hx   . Hypotension Neg Hx   . Malignant hyperthermia Neg Hx   . Pseudochol deficiency Neg Hx    Social History   Socioeconomic History  . Marital status: Widowed    Spouse name: Not on file  . Number of children: 3  . Years of education: Not on file  . Highest education level: Not on file  Occupational History  . Not on file  Tobacco Use  . Smoking status: Former Smoker  Types: Cigars    Quit date: 09/01/2006    Years since quitting: 13.8  . Smokeless tobacco: Never  Used  Vaping Use  . Vaping Use: Never used  Substance and Sexual Activity  . 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 Drug use: No  . Sexual activity: Never  Other Topics Concern  . Not on file  Social History Narrative  . Not on file   Social Determinants of Health   Financial Resource Strain:   . Difficulty of Paying Living Expenses: Not on file  Food Insecurity:   . Worried About Charity fundraiser in the Last Year: Not on file  . Ran Out of Food in the Last Year: Not on file  Transportation Needs: No Transportation Needs  . Lack of Transportation (Medical): No  . Lack of Transportation (Non-Medical): No  Physical Activity: Inactive  . Days of Exercise per Week: 0 days  . Minutes of Exercise per Session: 0 min  Stress:   . Feeling of Stress : Not on file  Social Connections:   . Frequency of Communication with Friends and Family: Not on file  . Frequency of Social Gatherings with Friends and Family: Not on file  . Attends Religious Services: Not on file  . Active Member of Clubs or Organizations: Not on file  . Attends Archivist Meetings: Not on file  . Marital Status: Not on file    Review of Systems  Constitutional: Negative for chills, diaphoresis, fatigue and fever.  HENT: Negative for congestion, ear pain and sore throat.   Respiratory: Negative for cough and shortness of breath.   Cardiovascular: Negative for chest pain and leg swelling.  Psychiatric/Behavioral: Positive for dysphoric mood.     Objective:  BP 125/80   Pulse 91   Temp 98.1 F (36.7 C)   Ht '5\' 7"'  (1.702 m)   Wt 249 lb 9.6 oz (113.2 kg)   SpO2 99%   BMI 39.09 kg/m   BP/Weight 07/17/2020 07/05/2020 45/04/997  Systolic BP 338 250 539  Diastolic BP 80 60 68  Wt. (Lbs) 249.6 252 251  BMI 39.09 39.47 39.31    Physical Exam Vitals reviewed.  Constitutional:      Appearance: Normal appearance.  Cardiovascular:     Rate and Rhythm: Normal  rate. Rhythm irregular.     Pulses: Normal pulses.     Heart sounds: Normal heart sounds.  Pulmonary:     Effort: Pulmonary effort is normal.     Breath sounds: Normal breath sounds.  Musculoskeletal:        General: Normal range of motion.  Skin:    Comments: BL lower extremities (rt > lt) skin peeled in areas. Bl anterior legs in various stages of healing. Excoriations present.   Neurological:     Mental Status: He is alert.  Psychiatric:        Mood and Affect: Mood normal.        Behavior: Behavior normal.    Diabetic Foot Exam - Simple   No data filed       Lab Results  Component Value Date   WBC 7.1 07/05/2020   HGB 14.6 07/05/2020   HCT 43.3 07/05/2020   PLT 316 07/05/2020   GLUCOSE 88 07/05/2020   CHOL 82 (L) 07/05/2020   TRIG 39 07/05/2020   HDL 39 (L) 07/05/2020   LDLCALC 32 07/05/2020   ALT 13 07/05/2020  AST 21 07/05/2020   NA 140 07/05/2020   K 3.7 07/05/2020   CL 102 07/05/2020   CREATININE 2.28 (H) 07/05/2020   BUN 24 07/05/2020   CO2 21 07/05/2020   TSH 1.560 03/27/2020   INR 1.68 09/01/2018   HGBA1C 9.2 (H) 07/05/2020      Assessment & Plan:   1. Type 2 diabetes mellitus with diabetic neuropathy, with long-term current use of insulin (HCC) The current medical regimen is effective;  continue present plan and medications.   2. PVD (peripheral vascular disease) ([place]) Improved.   3. Depression, major, recurrent, mild (HCC) Start zoloft 50 mg once daily in am.   4. Skin ulcer of right lower leg, limited to breakdown of skin ([place]) Wounds cleansed. Dressing applied.   5. Ulcer of right leg, limited to breakdown of skin (HCC)  Wounds cleansed. Dressing applied.   6. Pruritis Start on xyzal 5 mg once daily in evening.    Meds ordered this encounter  Medications  . levocetirizine (XYZAL) 5 MG tablet    Sig: Take 1 tablet (5 mg total) by mouth every evening.    Dispense:  30 tablet    Refill:  0  . sertraline (ZOLOFT) 50 MG tablet     Sig: Take 1 tablet (50 mg total) by mouth daily.    Dispense:  30 tablet    Refill:  1   I spent 30 minutes dedicated to the care of this patient on the date of this encounter to include face-to-face time with the patient, as well as: Obtaining and/or reviewing separately obtained history. Performing a medically appropriate examination and/or evaluation. Documenting clinical information in the electronic or other health record.  My nursing staff have aided in the documentation of this note on the behalf of Rochel Brome, MD,as directed by  Rochel Brome, MD and thoroughly reviewed by Rochel Brome, MD.  Follow-up: Return in about 1 month (around 08/16/2020).  An After Visit Summary was printed and given to the patient.  Rochel Brome, MD [facility] [phone]

## 2020-07-17 NOTE — Telephone Encounter (Signed)
Referral discussed with [facility] for skilled nursing for dressing changes.  The referral was declined since they have worked with the patient in the past and he has not been compliant with recommendations.  He had been referred to wound care but refused the referral. Will discuss with Dr. Tobie Poet.

## 2020-07-18 NOTE — Telephone Encounter (Signed)
Let the patient know. Try to refer to alternative agency. Kc

## 2020-07-19 ENCOUNTER — Telehealth: Payer: Self-pay

## 2020-07-19 DIAGNOSIS — Z794 Long term (current) use of insulin: Secondary | ICD-10-CM | POA: Diagnosis not present

## 2020-07-19 DIAGNOSIS — E1129 Type 2 diabetes mellitus with other diabetic kidney complication: Secondary | ICD-10-CM | POA: Diagnosis not present

## 2020-07-19 NOTE — Chronic Care Management (AMB) (Signed)
Chronic Care Management Pharmacy Assistant   Name: ZAKHARI FOGEL  MRN: [id number] DOB: 06-03-1951  Reason for Encounter: Medication Review - Patient Assistance Coordination.   PCP : Rochel Brome, MD  Allergies:   Allergies  Allergen Reactions  . Hydrocodone Itching  . Morphine Itching  . Duloxetine Hcl Other (See Comments)    Unknown reaction  . Codeine Itching  . Penicillins Itching and Rash    DID THE REACTION INVOLVE: Swelling of the face/tongue/throat, SOB, or low BP? No Sudden or severe rash/hives, skin peeling, or the inside of the mouth or nose? No Did it require medical treatment? No When did it last happen?unknown If all above answers are "NO", may proceed with cephalosporin use.     Medications: Outpatient Encounter Medications as of 07/19/2020  Medication Sig  . albuterol (PROVENTIL) (2.5 MG/3ML) 0.083% nebulizer solution Take 3 mLs (2.5 mg total) by nebulization 2 (two) times daily. And as needed (Patient taking differently: Take 2.5 mg by nebulization 2 (two) times daily as needed for wheezing. )  . atorvastatin (LIPITOR) 40 MG tablet TAKE 1 TABLET BY MOUTH ONCE DAILY  . Azelastine HCl 0.15 % SOLN Place 2 sprays into both nostrils daily.   . BD PEN NEEDLE NANO 2ND GEN 32G X 4 MM MISC USE DAILY WITH BASAGLAR  . clopidogrel (PLAVIX) 75 MG tablet TAKE 1 TABLET BY MOUTH DAILY  . Continuous Blood Gluc Receiver (DEXCOM G6 RECEIVER) DEVI 1 each by Does not apply route 4 (four) times daily -  before meals and at bedtime.  . Continuous Blood Gluc Sensor (DEXCOM G6 SENSOR) MISC Apply every 10 days  . diltiazem (CARDIZEM CD) 120 MG 24 hr capsule TAKE 1 CAPSULE BY MOUTH DAILY  . docusate sodium (COLACE) 100 MG capsule Take 100 mg by mouth 2 (two) times daily.  . famotidine (PEPCID) 20 MG tablet TAKE 2 TABLETS BY MOUTH TWICE DAILY  . ferrous sulfate 325 (65 FE) MG tablet Take 325 mg by mouth daily.  . Glucagon, rDNA, (GLUCAGON EMERGENCY) 1 MG KIT Inject 1 mg as  directed as needed (low blood sugar).   . hydrALAZINE (APRESOLINE) 100 MG tablet Take 50 mg by mouth 2 (two) times daily.  . insulin detemir (LEVEMIR FLEXTOUCH) 100 UNIT/ML FlexPen Inject 30 Units into the skin daily.  . insulin lispro (HUMALOG) 100 UNIT/ML cartridge SSI ranging up 60 U  . isosorbide mononitrate (IMDUR) 120 MG 24 hr tablet TAKE 1 TABLET BY MOUTH ONCE DAILY IN THEMORNINGS  . lactulose (CHRONULAC) 10 GM/15ML solution TAKE 30MLS BY MOUTH IN THE MORNING AND AT BEDTIME  . levocetirizine (XYZAL) 5 MG tablet Take 1 tablet (5 mg total) by mouth every evening.  . metolazone (ZAROXOLYN) 2.5 MG tablet Take 1 tablet (2.5 mg total) by mouth daily. 3 hours prior to torsemide  . mometasone-formoterol (DULERA) 200-5 MCG/ACT AERO Inhale 2 puffs into the lungs 2 (two) times daily as needed for wheezing.  . nitroGLYCERIN (NITROSTAT) 0.4 MG SL tablet Place 0.4 mg under the tongue every 5 (five) minutes as needed for chest pain.   Marland Kitchen oxyCODONE-acetaminophen (PERCOCET/ROXICET) 5-325 MG tablet TAKE 1 TABLET BY MOUTH EVERY 4 HOURS AS NEEDED FOR SEVERE PAIN  . OXYGEN Inhale into the lungs. 2 liters at bedtime  . pantoprazole (PROTONIX) 40 MG tablet Take 40 mg by mouth in the morning.   . Potassium Chloride ER 20 MEQ TBCR Take 40 mEq by mouth 2 (two) times daily.   . potassium chloride SA (KLOR-CON)  20 MEQ tablet TAKE TWO TABLETS BY MOUTH TWICE A DAY  . pregabalin (LYRICA) 75 MG capsule Take 1 capsule (75 mg total) by mouth 2 (two) times daily.  Marland Kitchen RELISTOR 150 MG TABS TAKE 3 TABLETS BY MOUTH EVERY MORNING  . Semaglutide, 1 MG/DOSE, (OZEMPIC, 1 MG/DOSE,) 2 MG/1.5ML SOPN Inject 1 mg into the skin once a week.  . sertraline (ZOLOFT) 50 MG tablet Take 1 tablet (50 mg total) by mouth daily.  Marland Kitchen torsemide (DEMADEX) 20 MG tablet TAKE 3 TABLETS BY MOUTH AT LUNCH AND TAKE 3 TABLETS AT SUPPER  . Vitamin D, Ergocalciferol, (DRISDOL) 1.25 MG (50000 UNIT) CAPS capsule TAKE 1 CAPSULE BY MOUTH DAILY  . XARELTO 15 MG TABS  tablet TAKE 1 TABLET BY MOUTH ONCE DAILY (Patient taking differently: Take 15 mg by mouth at bedtime. )  . XTAMPZA ER 27 MG C12A TAKE 1 CAPSULE BY MOUTH TWICE DAILY   No facility-administered encounter medications on file as of 07/19/2020.    Current Diagnosis: Patient Active Problem List   Diagnosis Date Noted  . Cholera screening 07/05/2020  . Acute sore throat 06/08/2020  . Cough 06/08/2020  . Diabetic ulcer of toe of right foot associated with type 2 diabetes mellitus, with necrosis of bone ([place])   . Fall 03/07/2020  . Closed fracture of multiple carpal bones 03/07/2020  . Chronic respiratory failure with hypoxia ([place]) 02/11/2020  . Morbid obesity with BMI of 40.0-44.9, adult ([place]) 02/11/2020  . Moderate recurrent major depression ([place]) 02/11/2020  . Class 3 severe obesity in adult [facility]) 12/30/2019  . Uncomplicated opioid dependence ([place]) 12/17/2019  . Anemia of chronic disease 12/17/2019  . Ulcers of both lower legs ([place]) 12/17/2019  . Acquired thrombophilia ([place]) 12/17/2019  . Acute venous embolism and thrombosis of deep vessels of distal lower extremity ([place]) 11/13/2019  . Vitamin D insufficiency 10/11/2019  . PVD (peripheral vascular disease) ([place]) 09/28/2018  . DM (diabetes mellitus), type 2 with renal complications ([place]) 52/77/8242  . Drug induced constipation 11/10/2017  . Chronic pain of both knees 03/18/2017  . Primary osteoarthritis of both knees 03/18/2017  . Chronic diastolic heart failure ([place]) 02/22/2015  . LBBB (left bundle branch block) 02/22/2015  . OSA on CPAP 01/25/2015  . Back pain 01/25/2015  . Abnormal EKG 11/29/2014  . Normal coronary arteries 2011 11/29/2014  . Long term (current) use of anticoagulants 03/21/2014  . CVA (cerebral vascular accident) ([place]) 02/09/2014  . Chronic anticoagulation-Xarelto 02/09/2014  . Chronic atrial fibrillation ([place]) 01/03/2014  . Diabetic polyneuropathy associated with type 2 diabetes mellitus ([place]) 12/21/2013  .  Hypothyroidism   . Hypertensive heart disease with heart failure ([place])   . Restless leg syndrome   . GERD (gastroesophageal reflux disease)   . Sleep apnea   . Hyperlipidemia   . Neuropathy   . Bariatric surgery status 11/03/2013      Follow-Up:  Patient [place] to check on status of Dexcom Continuous Glucose Monitor. Per representative states that order has been approved waiting on carrier to pick up to be delivered to patient , after pick up from carrier it could take 5 - 7 days before Dexcom gets to patient's address.  Called patient to give him status on delivery, left message for patient to call me back.   Elly Modena Owens Shark , [place] Notified   Judithann Sheen, [facility] Clinical Pharmacist Assistant [phone]

## 2020-07-24 DIAGNOSIS — I824Z2 Acute embolism and thrombosis of unspecified deep veins of left distal lower extremity: Secondary | ICD-10-CM | POA: Diagnosis not present

## 2020-07-24 DIAGNOSIS — I130 Hypertensive heart and chronic kidney disease with heart failure and stage 1 through stage 4 chronic kidney disease, or unspecified chronic kidney disease: Secondary | ICD-10-CM | POA: Diagnosis not present

## 2020-07-24 DIAGNOSIS — E1151 Type 2 diabetes mellitus with diabetic peripheral angiopathy without gangrene: Secondary | ICD-10-CM | POA: Diagnosis not present

## 2020-07-25 DIAGNOSIS — J9610 Chronic respiratory failure, unspecified whether with hypoxia or hypercapnia: Secondary | ICD-10-CM | POA: Diagnosis not present

## 2020-07-27 DIAGNOSIS — Z794 Long term (current) use of insulin: Secondary | ICD-10-CM | POA: Diagnosis not present

## 2020-07-27 DIAGNOSIS — M797 Fibromyalgia: Secondary | ICD-10-CM | POA: Diagnosis not present

## 2020-07-27 DIAGNOSIS — Z7902 Long term (current) use of antithrombotics/antiplatelets: Secondary | ICD-10-CM | POA: Diagnosis not present

## 2020-07-27 DIAGNOSIS — G2581 Restless legs syndrome: Secondary | ICD-10-CM | POA: Diagnosis not present

## 2020-07-27 DIAGNOSIS — Z7901 Long term (current) use of anticoagulants: Secondary | ICD-10-CM | POA: Diagnosis not present

## 2020-07-27 DIAGNOSIS — E039 Hypothyroidism, unspecified: Secondary | ICD-10-CM | POA: Diagnosis not present

## 2020-07-27 DIAGNOSIS — I824Z2 Acute embolism and thrombosis of unspecified deep veins of left distal lower extremity: Secondary | ICD-10-CM | POA: Diagnosis not present

## 2020-07-27 DIAGNOSIS — E1151 Type 2 diabetes mellitus with diabetic peripheral angiopathy without gangrene: Secondary | ICD-10-CM | POA: Diagnosis not present

## 2020-07-27 DIAGNOSIS — K219 Gastro-esophageal reflux disease without esophagitis: Secondary | ICD-10-CM | POA: Diagnosis not present

## 2020-07-27 DIAGNOSIS — E1122 Type 2 diabetes mellitus with diabetic chronic kidney disease: Secondary | ICD-10-CM | POA: Diagnosis not present

## 2020-07-27 DIAGNOSIS — I482 Chronic atrial fibrillation, unspecified: Secondary | ICD-10-CM | POA: Diagnosis not present

## 2020-07-27 DIAGNOSIS — G4733 Obstructive sleep apnea (adult) (pediatric): Secondary | ICD-10-CM | POA: Diagnosis not present

## 2020-07-27 DIAGNOSIS — J9620 Acute and chronic respiratory failure, unspecified whether with hypoxia or hypercapnia: Secondary | ICD-10-CM | POA: Diagnosis not present

## 2020-07-27 DIAGNOSIS — I872 Venous insufficiency (chronic) (peripheral): Secondary | ICD-10-CM | POA: Diagnosis not present

## 2020-07-27 DIAGNOSIS — I252 Old myocardial infarction: Secondary | ICD-10-CM | POA: Diagnosis not present

## 2020-07-27 DIAGNOSIS — Z8673 Personal history of transient ischemic attack (TIA), and cerebral infarction without residual deficits: Secondary | ICD-10-CM | POA: Diagnosis not present

## 2020-07-27 DIAGNOSIS — J449 Chronic obstructive pulmonary disease, unspecified: Secondary | ICD-10-CM | POA: Diagnosis not present

## 2020-07-27 DIAGNOSIS — Z87891 Personal history of nicotine dependence: Secondary | ICD-10-CM | POA: Diagnosis not present

## 2020-07-27 DIAGNOSIS — I130 Hypertensive heart and chronic kidney disease with heart failure and stage 1 through stage 4 chronic kidney disease, or unspecified chronic kidney disease: Secondary | ICD-10-CM | POA: Diagnosis not present

## 2020-07-27 DIAGNOSIS — I5032 Chronic diastolic (congestive) heart failure: Secondary | ICD-10-CM | POA: Diagnosis not present

## 2020-07-27 DIAGNOSIS — M170 Bilateral primary osteoarthritis of knee: Secondary | ICD-10-CM | POA: Diagnosis not present

## 2020-07-27 DIAGNOSIS — I25119 Atherosclerotic heart disease of native coronary artery with unspecified angina pectoris: Secondary | ICD-10-CM | POA: Diagnosis not present

## 2020-07-27 DIAGNOSIS — E1142 Type 2 diabetes mellitus with diabetic polyneuropathy: Secondary | ICD-10-CM | POA: Diagnosis not present

## 2020-07-27 DIAGNOSIS — J9610 Chronic respiratory failure, unspecified whether with hypoxia or hypercapnia: Secondary | ICD-10-CM | POA: Diagnosis not present

## 2020-07-28 DIAGNOSIS — G4733 Obstructive sleep apnea (adult) (pediatric): Secondary | ICD-10-CM | POA: Diagnosis not present

## 2020-07-31 ENCOUNTER — Other Ambulatory Visit: Payer: Self-pay | Admitting: Family Medicine

## 2020-07-31 DIAGNOSIS — I872 Venous insufficiency (chronic) (peripheral): Secondary | ICD-10-CM | POA: Diagnosis not present

## 2020-07-31 DIAGNOSIS — Z794 Long term (current) use of insulin: Secondary | ICD-10-CM | POA: Diagnosis not present

## 2020-07-31 DIAGNOSIS — Z8673 Personal history of transient ischemic attack (TIA), and cerebral infarction without residual deficits: Secondary | ICD-10-CM | POA: Diagnosis not present

## 2020-07-31 DIAGNOSIS — I313 Pericardial effusion (noninflammatory): Secondary | ICD-10-CM | POA: Insufficient documentation

## 2020-07-31 DIAGNOSIS — I509 Heart failure, unspecified: Secondary | ICD-10-CM | POA: Insufficient documentation

## 2020-07-31 DIAGNOSIS — I5032 Chronic diastolic (congestive) heart failure: Secondary | ICD-10-CM | POA: Diagnosis not present

## 2020-07-31 DIAGNOSIS — I25119 Atherosclerotic heart disease of native coronary artery with unspecified angina pectoris: Secondary | ICD-10-CM | POA: Diagnosis not present

## 2020-07-31 DIAGNOSIS — I219 Acute myocardial infarction, unspecified: Secondary | ICD-10-CM | POA: Insufficient documentation

## 2020-07-31 DIAGNOSIS — Z7902 Long term (current) use of antithrombotics/antiplatelets: Secondary | ICD-10-CM | POA: Diagnosis not present

## 2020-07-31 DIAGNOSIS — Z8719 Personal history of other diseases of the digestive system: Secondary | ICD-10-CM | POA: Insufficient documentation

## 2020-07-31 DIAGNOSIS — I739 Peripheral vascular disease, unspecified: Secondary | ICD-10-CM | POA: Insufficient documentation

## 2020-07-31 DIAGNOSIS — I252 Old myocardial infarction: Secondary | ICD-10-CM | POA: Diagnosis not present

## 2020-07-31 DIAGNOSIS — J45909 Unspecified asthma, uncomplicated: Secondary | ICD-10-CM | POA: Insufficient documentation

## 2020-07-31 DIAGNOSIS — Z9981 Dependence on supplemental oxygen: Secondary | ICD-10-CM | POA: Insufficient documentation

## 2020-07-31 DIAGNOSIS — M797 Fibromyalgia: Secondary | ICD-10-CM | POA: Diagnosis not present

## 2020-07-31 DIAGNOSIS — Z7901 Long term (current) use of anticoagulants: Secondary | ICD-10-CM | POA: Diagnosis not present

## 2020-07-31 DIAGNOSIS — K219 Gastro-esophageal reflux disease without esophagitis: Secondary | ICD-10-CM | POA: Diagnosis not present

## 2020-07-31 DIAGNOSIS — G709 Myoneural disorder, unspecified: Secondary | ICD-10-CM | POA: Insufficient documentation

## 2020-07-31 DIAGNOSIS — J449 Chronic obstructive pulmonary disease, unspecified: Secondary | ICD-10-CM | POA: Diagnosis not present

## 2020-07-31 DIAGNOSIS — M1990 Unspecified osteoarthritis, unspecified site: Secondary | ICD-10-CM | POA: Insufficient documentation

## 2020-07-31 DIAGNOSIS — M170 Bilateral primary osteoarthritis of knee: Secondary | ICD-10-CM | POA: Diagnosis not present

## 2020-07-31 DIAGNOSIS — E1142 Type 2 diabetes mellitus with diabetic polyneuropathy: Secondary | ICD-10-CM | POA: Diagnosis not present

## 2020-07-31 DIAGNOSIS — I482 Chronic atrial fibrillation, unspecified: Secondary | ICD-10-CM | POA: Diagnosis not present

## 2020-07-31 DIAGNOSIS — E785 Hyperlipidemia, unspecified: Secondary | ICD-10-CM | POA: Insufficient documentation

## 2020-07-31 DIAGNOSIS — E1151 Type 2 diabetes mellitus with diabetic peripheral angiopathy without gangrene: Secondary | ICD-10-CM | POA: Diagnosis not present

## 2020-07-31 DIAGNOSIS — G2581 Restless legs syndrome: Secondary | ICD-10-CM | POA: Diagnosis not present

## 2020-07-31 DIAGNOSIS — I3139 Other pericardial effusion (noninflammatory): Secondary | ICD-10-CM | POA: Insufficient documentation

## 2020-07-31 DIAGNOSIS — E039 Hypothyroidism, unspecified: Secondary | ICD-10-CM | POA: Diagnosis not present

## 2020-07-31 DIAGNOSIS — I10 Essential (primary) hypertension: Secondary | ICD-10-CM | POA: Insufficient documentation

## 2020-07-31 DIAGNOSIS — E669 Obesity, unspecified: Secondary | ICD-10-CM | POA: Insufficient documentation

## 2020-07-31 DIAGNOSIS — I4891 Unspecified atrial fibrillation: Secondary | ICD-10-CM | POA: Insufficient documentation

## 2020-07-31 DIAGNOSIS — G4733 Obstructive sleep apnea (adult) (pediatric): Secondary | ICD-10-CM | POA: Diagnosis not present

## 2020-07-31 DIAGNOSIS — E1122 Type 2 diabetes mellitus with diabetic chronic kidney disease: Secondary | ICD-10-CM | POA: Diagnosis not present

## 2020-07-31 DIAGNOSIS — I824Z2 Acute embolism and thrombosis of unspecified deep veins of left distal lower extremity: Secondary | ICD-10-CM | POA: Diagnosis not present

## 2020-07-31 DIAGNOSIS — Z87891 Personal history of nicotine dependence: Secondary | ICD-10-CM | POA: Diagnosis not present

## 2020-07-31 DIAGNOSIS — I499 Cardiac arrhythmia, unspecified: Secondary | ICD-10-CM | POA: Insufficient documentation

## 2020-07-31 DIAGNOSIS — I130 Hypertensive heart and chronic kidney disease with heart failure and stage 1 through stage 4 chronic kidney disease, or unspecified chronic kidney disease: Secondary | ICD-10-CM | POA: Diagnosis not present

## 2020-07-31 DIAGNOSIS — J9620 Acute and chronic respiratory failure, unspecified whether with hypoxia or hypercapnia: Secondary | ICD-10-CM | POA: Diagnosis not present

## 2020-08-02 ENCOUNTER — Ambulatory Visit: Payer: Medicare Other

## 2020-08-02 ENCOUNTER — Other Ambulatory Visit: Payer: Self-pay

## 2020-08-02 DIAGNOSIS — E782 Mixed hyperlipidemia: Secondary | ICD-10-CM

## 2020-08-02 DIAGNOSIS — Z794 Long term (current) use of insulin: Secondary | ICD-10-CM

## 2020-08-02 DIAGNOSIS — E1140 Type 2 diabetes mellitus with diabetic neuropathy, unspecified: Secondary | ICD-10-CM

## 2020-08-02 NOTE — Patient Instructions (Signed)
Visit Information  Goals Addressed            This Visit's Progress   . Pharmacy Care Plan       CARE PLAN ENTRY  Current Barriers:  . Chronic Disease Management support, education, and care coordination needs related to Hypertension and Diabetes   Hypertension . Pharmacist Clinical Goal(s): o Over the next 90 days, patient will work with PharmD and providers to achieve BP goal <130/80 . Current regimen:  o hydralazine 50 mg bid o metolazone 2.5 mg daily o torsemide 60 mg bid . Interventions: o Pharmacist helped patient reconcile medications. . Patient self care activities - Over the next 90 days, patient will: o Check BP daily, document, and provide at future appointments o Ensure daily salt intake < 2300 mg/day  Hyperlipidemia . Pharmacist Clinical Goal(s): o Over the next 90 days, patient will work with PharmD and providers to maintain LDL goal < 70 . Current regimen:  o Atorvastatin 40 mg daily . Interventions: o Recommend patient continue taking medication as prescribed.  . Patient self care activities - Over the next 90 days, patient will: o Take medication as prescribed   Diabetes . Pharmacist Clinical Goal(s): o Over the next 90 days, patient will work with PharmD and providers to achieve A1c goal <7% . Current regimen:  o glucagon kit o levemir flextouch 26 units daily o Ozempic 1 mg weekly o Dexcom o Humalog - sliding scale insulin  . Interventions: o Recommend patient begin sliding scale insulin.  o Recommended patient set up and begin using Dexcom.  o Reviewed recently blood sugar readings and low fasting numbers. Recommend decrease Levemir to 26 units daily.  . Patient self care activities - Over the next 90 days, patient will: o Set up Dexcom CGM and provide clinic with email address to link Dexcom to clinic account.  o Check blood sugar twice daily and 3-4 times daily, document, and provide at future appointments o Contact provider with any episodes  of hypoglycemia  Medication management . Pharmacist Clinical Goal(s): o Over the next 90 days, patient will work with PharmD and providers to achieve optimal medication adherence . Current pharmacy: [facility] . Interventions o Comprehensive medication review performed. o Continue current medication management strategy . Patient self care activities - Over the next 90 days, patient will: o Focus on medication adherence by using pill box o Take medications as prescribed o Report any questions or concerns to PharmD and/or provider(s)  Please see past updates related to this goal by clicking on the "Past Updates" button in the selected goal         The patient verbalized understanding of instructions, educational materials, and care plan provided today and declined offer to receive copy of patient instructions, educational materials, and care plan.   Telephone follow up appointment with pharmacy team member scheduled for: 09/2020  Sherre Poot, PharmD, [facility] Clinical Pharmacist [facility] [phone] (office) [phone] (mobile)   Exercises To Do While Sitting  Exercises that you do while sitting (chair exercises) can give you many of the same benefits as full exercise. Benefits include strengthening your heart, burning calories, and keeping muscles and joints healthy. Exercise can also improve your mood and help with depression and anxiety. You may benefit from chair exercises if you are unable to do standing exercises because of:  Diabetic foot pain.  Obesity.  Illness.  Arthritis.  Recovery from surgery or injury.  Breathing problems.  Balance problems.  Another  type of disability. Before starting chair exercises, check with your health care provider or a physical therapist to find out how much exercise you can tolerate and which exercises are safe for you. If your health care provider approves:  Start out slowly and build up over time. Aim to  work up to about 10-20 minutes for each exercise session.  Make exercise part of your daily routine.  Drink water when you exercise. Do not wait until you are thirsty. Drink every 10-15 minutes.  Stop exercising right away if you have pain, nausea, shortness of breath, or dizziness.  If you are exercising in a wheelchair, make sure to lock the wheels.  Ask your health care provider whether you can do tai chi or yoga. Many positions in these mind-body exercises can be modified to do while seated. Warm-up Before starting other exercises: 1. Sit up as straight as you can. Have your knees bent at 90 degrees, which is the shape of the capital letter "L." Keep your feet flat on the floor. 2. Sit at the front edge of your chair, if you can. 3. Pull in (tighten) the muscles in your abdomen and stretch your spine and neck as straight as you can. Hold this position for a few minutes. 4. Breathe in and out evenly. Try to concentrate on your breathing, and relax your mind. Stretching Exercise A: Arm stretch 1. Hold your arms out straight in front of your body. 2. Bend your hands at the wrist with your fingers pointing up, as if signaling someone to stop. Notice the slight tension in your forearms as you hold the position. 3. Keeping your arms out and your hands bent, rotate your hands outward as far as you can and hold this stretch. Aim to have your thumbs pointing up and your pinkie fingers pointing down. Slowly repeat arm stretches for one minute as tolerated. Exercise B: Leg stretch 1. If you can move your legs, try to "draw" letters on the floor with the toes of your foot. Write your name with one foot. 2. Write your name with the toes of your other foot. Slowly repeat the movements for one minute as tolerated. Exercise C: Reach for the sky 1. Reach your hands as far over your head as you can to stretch your spine. 2. Move your hands and arms as if you are climbing a rope. Slowly repeat the  movements for one minute as tolerated. Range of motion exercises Exercise A: Shoulder roll 1. Let your arms hang loosely at your sides. 2. Lift just your shoulders up toward your ears, then let them relax back down. 3. When your shoulders feel loose, rotate your shoulders in backward and forward circles. Do shoulder rolls slowly for one minute as tolerated. Exercise B: March in place 1. As if you are marching, pump your arms and lift your legs up and down. Lift your knees as high as you can. ? If you are unable to lift your knees, just pump your arms and move your ankles and feet up and down. March in place for one minute as tolerated. Exercise C: Seated jumping jacks 1. Let your arms hang down straight. 2. Keeping your arms straight, lift them up over your head. Aim to point your fingers to the ceiling. 3. While you lift your arms, straighten your legs and slide your heels along the floor to your sides, as wide as you can. 4. As you bring your arms back down to your sides, slide your  legs back together. ? If you are unable to use your legs, just move your arms. Slowly repeat seated jumping jacks for one minute as tolerated. Strengthening exercises Exercise A: Shoulder squeeze 1. Hold your arms straight out from your body to your sides, with your elbows bent and your fists pointed at the ceiling. 2. Keeping your arms in the bent position, move them forward so your elbows and forearms meet in front of your face. 3. Open your arms back out as wide as you can with your elbows still bent, until you feel your shoulder blades squeezing together. Hold for 5 seconds. Slowly repeat the movements forward and backward for one minute as tolerated. Contact a health care provider if you:  Had to stop exercising due to any of the following: ? Pain. ? Nausea. ? Shortness of breath. ? Dizziness. ? Fatigue.  Have significant pain or soreness after exercising. Get help right away if you have:  Chest  pain.  Difficulty breathing. These symptoms may represent a serious problem that is an emergency. Do not wait to see if the symptoms will go away. Get medical help right away. Call your local emergency services (911 in the [place] Do not drive yourself to the hospital. This information is not intended to replace advice given to you by your health care provider. Make sure you discuss any questions you have with your health care provider. Document Revised: 12/09/2018 Document Reviewed: 07/01/2017 Elsevier Patient Education  2020 [employer].

## 2020-08-02 NOTE — Chronic Care Management (AMB) (Signed)
Chronic Care Management Pharmacy  Name: Jon [name]  MRN: [id number] DOB: 1951/04/21  Chief Complaint/ HPI  Jon [name],  69 y.o. , male presents for their Follow-Up CCM visit with the clinical pharmacist via telephone due to COVID-19 Pandemic.  PCP : Rochel Brome, MD   Recommended Plan:   Patient has several fasting blood sugar below 100 mg/dL. Recommend reducing Levemir dose to 26 units daily. Patient has not set up his Dexcom yet but will call with information to link to clinic account.   Patient states that he does not have wound care home healthy. Says his leg is dry and scabbing.   Their chronic conditions include: Hypothyroidism, GERD, Afib, DM, Neuropathy, RLS, HLD, Vitamin D insuficiency, anemia, OA of both knees, hx of stroke.  Office Visits: 07/17/2020 - start zoloft 50 mg daily in am. Start on levoceterizine 5 mg daily in the evening for pruritis.  07/05/2020 - pfizer covid vaccine. Continue Levemir 30 units daily. Dr. Tobie Poet wanted to increase sliding scale insulin but patient declined since hasn't been giving mealtime insulin.  Consult Visit: 07/05/2020 - podiatry - educated on diabetic foot care. Nails debrided. Post-operative exam - well healed.   Medications: Outpatient Encounter Medications as of 08/02/2020  Medication Sig  . sertraline (ZOLOFT) 50 MG tablet Take 1 tablet (50 mg total) by mouth daily.  Marland Kitchen albuterol (PROVENTIL) (2.5 MG/3ML) 0.083% nebulizer solution Take 3 mLs (2.5 mg total) by nebulization 2 (two) times daily. And as needed (Patient taking differently: Take 2.5 mg by nebulization 2 (two) times daily as needed for wheezing. )  . atorvastatin (LIPITOR) 40 MG tablet TAKE 1 TABLET BY MOUTH ONCE DAILY  . Azelastine HCl 0.15 % SOLN Place 2 sprays into both nostrils daily.   . BD PEN NEEDLE NANO 2ND GEN 32G X 4 MM MISC USE DAILY WITH BASAGLAR  . clopidogrel (PLAVIX) 75 MG tablet TAKE 1 TABLET BY MOUTH DAILY  . Continuous Blood Gluc Receiver (DEXCOM  G6 RECEIVER) DEVI 1 each by Does not apply route 4 (four) times daily -  before meals and at bedtime.  . Continuous Blood Gluc Sensor (DEXCOM G6 SENSOR) MISC Apply every 10 days  . diltiazem (CARDIZEM CD) 120 MG 24 hr capsule TAKE 1 CAPSULE BY MOUTH DAILY  . docusate sodium (COLACE) 100 MG capsule Take 100 mg by mouth 2 (two) times daily.  . famotidine (PEPCID) 20 MG tablet TAKE 2 TABLETS BY MOUTH TWICE DAILY  . ferrous sulfate 325 (65 FE) MG tablet Take 325 mg by mouth daily.  . Glucagon, rDNA, (GLUCAGON EMERGENCY) 1 MG KIT Inject 1 mg as directed as needed (low blood sugar).   . hydrALAZINE (APRESOLINE) 100 MG tablet Take 50 mg by mouth 2 (two) times daily.  . insulin detemir (LEVEMIR FLEXTOUCH) 100 UNIT/ML FlexPen Inject 30 Units into the skin daily.  . insulin lispro (HUMALOG) 100 UNIT/ML cartridge SSI ranging up 60 U  . isosorbide mononitrate (IMDUR) 120 MG 24 hr tablet TAKE 1 TABLET BY MOUTH ONCE DAILY IN THEMORNINGS  . lactulose (CHRONULAC) 10 GM/15ML solution TAKE 30MLS BY MOUTH IN THE MORNING AND AT BEDTIME  . levocetirizine (XYZAL) 5 MG tablet Take 1 tablet (5 mg total) by mouth every evening.  . metolazone (ZAROXOLYN) 2.5 MG tablet Take 1 tablet (2.5 mg total) by mouth daily. 3 hours prior to torsemide  . mometasone-formoterol (DULERA) 200-5 MCG/ACT AERO Inhale 2 puffs into the lungs 2 (two) times daily as needed for wheezing.  Marland Kitchen  nitroGLYCERIN (NITROSTAT) 0.4 MG SL tablet Place 0.4 mg under the tongue every 5 (five) minutes as needed for chest pain.   Marland Kitchen oxyCODONE-acetaminophen (PERCOCET/ROXICET) 5-325 MG tablet TAKE 1 TABLET BY MOUTH EVERY 4 HOURS AS NEEDED FOR SEVERE PAIN  . OXYGEN Inhale into the lungs. 2 liters at bedtime  . pantoprazole (PROTONIX) 40 MG tablet Take 40 mg by mouth in the morning.   . Potassium Chloride ER 20 MEQ TBCR Take 40 mEq by mouth 2 (two) times daily.   . potassium chloride SA (KLOR-CON) 20 MEQ tablet TAKE TWO TABLETS BY MOUTH TWICE A DAY  . pregabalin  (LYRICA) 75 MG capsule Take 1 capsule (75 mg total) by mouth 2 (two) times daily.  Marland Kitchen RELISTOR 150 MG TABS TAKE 3 TABLETS BY MOUTH EVERY MORNING  . Semaglutide, 1 MG/DOSE, (OZEMPIC, 1 MG/DOSE,) 2 MG/1.5ML SOPN Inject 1 mg into the skin once a week.  . torsemide (DEMADEX) 20 MG tablet TAKE 3 TABLETS BY MOUTH AT LUNCH AND TAKE 3 TABLETS AT SUPPER  . Vitamin D, Ergocalciferol, (DRISDOL) 1.25 MG (50000 UNIT) CAPS capsule TAKE 1 CAPSULE BY MOUTH DAILY  . XARELTO 15 MG TABS tablet TAKE 1 TABLET BY MOUTH ONCE DAILY (Patient taking differently: Take 15 mg by mouth at bedtime. )  . XTAMPZA ER 27 MG C12A TAKE 1 CAPSULE BY MOUTH TWICE DAILY   No facility-administered encounter medications on file as of 08/02/2020.   Allergies  Allergen Reactions  . Hydrocodone Itching  . Morphine Itching  . Duloxetine Hcl Other (See Comments)    Unknown reaction  . Codeine Itching  . Penicillins Itching and Rash    DID THE REACTION INVOLVE: Swelling of the face/tongue/throat, SOB, or low BP? No Sudden or severe rash/hives, skin peeling, or the inside of the mouth or nose? No Did it require medical treatment? No When did it last happen?unknown If all above answers are "NO", may proceed with cephalosporin use.    SDOH Screenings   Alcohol Screen:   . Last Alcohol Screening Score (AUDIT): Not on file  Depression (PHQ2-9): Medium Risk  . PHQ-2 Score: 8  Financial Resource Strain:   . Difficulty of Paying Living Expenses: Not on file  Food Insecurity:   . Worried About Charity fundraiser in the Last Year: Not on file  . Ran Out of Food in the Last Year: Not on file  Housing: Low Risk   . Last Housing Risk Score: 0  Physical Activity: Inactive  . Days of Exercise per Week: 0 days  . Minutes of Exercise per Session: 0 min  Social Connections:   . Frequency of Communication with Friends and Family: Not on file  . Frequency of Social Gatherings with Friends and Family: Not on file  . Attends Religious  Services: Not on file  . Active Member of Clubs or Organizations: Not on file  . Attends Archivist Meetings: Not on file  . Marital Status: Not on file  Stress:   . Feeling of Stress : Not on file  Tobacco Use: Medium Risk  . Smoking Tobacco Use: Former Smoker  . Smokeless Tobacco Use: Never Used  Transportation Needs: No Transportation Needs  . Lack of Transportation (Medical): No  . Lack of Transportation (Non-Medical): No    Current Diagnosis/Assessment:  Goals Addressed            This Visit's Progress   . Pharmacy Care Plan       CARE PLAN ENTRY  Current Barriers:  . Chronic Disease Management support, education, and care coordination needs related to Hypertension and Diabetes   Hypertension . Pharmacist Clinical Goal(s): o Over the next 90 days, patient will work with PharmD and providers to achieve BP goal <130/80 . Current regimen:  o hydralazine 50 mg bid o metolazone 2.5 mg daily o torsemide 60 mg bid . Interventions: o Pharmacist helped patient reconcile medications. . Patient self care activities - Over the next 90 days, patient will: o Check BP daily, document, and provide at future appointments o Ensure daily salt intake < 2300 mg/day  Hyperlipidemia . Pharmacist Clinical Goal(s): o Over the next 90 days, patient will work with PharmD and providers to maintain LDL goal < 70 . Current regimen:  o Atorvastatin 40 mg daily . Interventions: o Recommend patient continue taking medication as prescribed.  . Patient self care activities - Over the next 90 days, patient will: o Take medication as prescribed   Diabetes . Pharmacist Clinical Goal(s): o Over the next 90 days, patient will work with PharmD and providers to achieve A1c goal <7% . Current regimen:  o glucagon kit o levemir flextouch 26 units daily o Ozempic 1 mg weekly o Dexcom o Humalog - sliding scale insulin  . Interventions: o Recommend patient begin sliding scale insulin.   o Recommended patient set up and begin using Dexcom.  o Reviewed recently blood sugar readings and low fasting numbers. Recommend decrease Levemir to 26 units daily.  . Patient self care activities - Over the next 90 days, patient will: o Set up Dexcom CGM and provide clinic with email address to link Dexcom to clinic account.  o Check blood sugar twice daily and 3-4 times daily, document, and provide at future appointments o Contact provider with any episodes of hypoglycemia  Medication management . Pharmacist Clinical Goal(s): o Over the next 90 days, patient will work with PharmD and providers to achieve optimal medication adherence . Current pharmacy: [facility] . Interventions o Comprehensive medication review performed. o Continue current medication management strategy . Patient self care activities - Over the next 90 days, patient will: o Focus on medication adherence by using pill box o Take medications as prescribed o Report any questions or concerns to PharmD and/or provider(s)  Please see past updates related to this goal by clicking on the "Past Updates" button in the selected goal         Diabetes   Recent Relevant Labs: Lab Results  Component Value Date/Time   HGBA1C 9.2 (H) 07/05/2020 09:19 AM   HGBA1C 8.8 (H) 03/27/2020 02:40 PM    Kidney Function Lab Results  Component Value Date/Time   CREATININE 2.28 (H) 07/05/2020 09:19 AM   CREATININE 2.50 (H) 05/01/2020 01:42 PM   GFRNONAA 28 (L) 07/05/2020 09:19 AM   GFRAA 32 (L) 07/05/2020 09:19 AM    Checking BG: 3x per Day  Recent FBG Readings: 73, 94, 90, 107, 126, 100, 151, 78, 76 Recent 4 pm Readings: 142, 176, 203, 209, 149, 111 Recent HS BG readings: 228, 229, 208, 226, 237 188  Patient has failed these meds in past: novolog, humulin, metformin, novolog  Patient is currently uncontrolled on the following medications:   glucagon kit  levemir flextouch 26 units daily  Ozempic 1 mg weekly    Humalog sliding scale with meals  Last diabetic Foot exam: 01/17/2020 Last diabetic Eye exam: 07/2020  We discussed: Patient's recent fasting blood sugars have been mainly below goal. He feels  bad when numbers are less than 100 mg daily. Recommend reducing Levemir to 26 units daily from 30 units to help prevent low blood sugar readings.   Patient reports that he will call pharmacist next week if fasting blood sugars not improving. Patient has a Dexcom but has not set it up. States that his stepdaughter will be assisting with this. Pharmacist instructed patient to call when he has meter set up to begin sharing with clinic Clarity account. Patient acknowledges understanding.   Patient states that legs are dry and scabby. He does not have current nursing home health for wound care.    Plan Recommend decreasing Levermir to 26 units daily to reduce low fasting blood sugar. Continue sliding scale insulin.   Patient to set up Dexcom CGM to begin tracking blood sugar and for help with alerting with low blood sugar.     Hyperlipidemia   Lipid Panel     Component Value Date/Time   CHOL 82 (L) 07/05/2020 0919   TRIG 39 07/05/2020 0919   HDL 39 (L) 07/05/2020 0919   LDLCALC 32 07/05/2020 0919     The ASCVD Risk score (Goff DC Jr., et al., 2013) failed to calculate for the following reasons:   The patient has a prior MI or stroke diagnosis   Patient has failed these meds in past: n/a Patient is currently controlled on the following medications:  . Atorvastatin 40 mg daily  We discussed:  diet and exercise extensively. Not really eating a heart healthy diet. Eats whatever his family fixes. Patient's fill history indicates good adherence to regimen. <5 day gap in fills of atorvastatin.   Plan  Continue current medications   Depression    Depression screen [facility] 2/9 08/02/2020 07/17/2020 07/05/2020 03/22/2020 02/16/2020  Decreased Interest 2 2 0 1 0  Down, Depressed, Hopeless 2 2 0 3 0   PHQ - 2 Score 4 4 0 4 0  Altered sleeping 0 0 - 0 0  Tired, decreased energy 3 2 - 1 0  Change in appetite 1 2 - 0 0  Feeling bad or failure about yourself  0 0 - 0 0  Trouble concentrating 0 0 - 0 0  Moving slowly or fidgety/restless 0 0 - 0 0  Suicidal thoughts 0 0 - 0 0  PHQ-9 Score 8 8 - 5 0  Difficult doing work/chores - Somewhat difficult - Somewhat difficult -  Some recent data might be hidden     Patient has failed these meds in past: amitriptyline Patient is currently uncontrolled on the following medications:  . Sertraline 50 mg daily   We discussed:  Patient reports good days and bad. Denies suicide ideations. Denies side effects from medication. Will see Dr. Tobie Poet in 2 weeks to assess.   Plan  Continue current medications    Medication Management   Pt uses [facility] pharmacy for all medications Uses pill box? Yes Pt endorses great compliance  We discussed: Encouraged patient to consider pharmacy delivery. Patient declines at this time.   Plan  Continue current medication management strategy    Follow up: 1 month phone visit

## 2020-08-06 NOTE — Progress Notes (Signed)
Cardiology Office Note:    Date:  08/07/2020   ID:  Jon [name], Jon [name] November 30, 1950, MRN [id number]  PCP:  Rochel Brome, MD  Cardiologist:  Shirlee More, MD    We dressed his wounds in the office, Dr. Cox's note says that she is working on finding home health service to come out and take care of his wounds lower extremities venous ulcers.   ASSESSMENT:    1. Hypertensive heart disease with heart failure ([name])   2. Mild CAD   3. Chronic atrial fibrillation ([name])   4. Mixed hyperlipidemia   5. Chronic obstructive pulmonary disease, unspecified COPD type ([name])    PLAN:    In order of problems listed above:  1. He has increasing edema despite severe CKD and high diuretic dosage.  He takes a calcium channel blocker that may be contributing heart rate is controlled with atrial fibrillation will be discontinued.  He also has a background history of pericardial disease with a pericardial window and a repeat echocardiogram be performed.  At this time I would not increase his current diuretics I can both high-dose torsemide and daily metolazone. 2. Stable CAD having no anginal discomfort I would continue medical therapy including his clopidogrel 3. Rate is controlled he has intense lower extremity edema discontinue the calcium channel blocker at this time I would not give him rate suppressant treatment as a CKD puts him at high risk with digoxin and his COPD has not allowed him to take a blocker in the past.  He will continue his anticoagulant 4. We will continue his high intensity statin with CAD 5. Stable managed by his PCP   Next appointment: 3 months   Medication Adjustments/Labs and Tests Ordered: Current medicines are reviewed at length with the patient today.  Concerns regarding medicines are outlined above.  Orders Placed This Encounter  Procedures  . ECHOCARDIOGRAM COMPLETE   No orders of the defined types were placed in this encounter.   Chief Complaint  Patient presents  with  . Congestive Heart Failure    Worsened edema with venous ulcers oozing    History of Present Illness:    Jon [name] is a 69 y.o. male with a hx of mild CAD, CHF, Atrial Fibrillation, HTN with stage 4 CKD, CVA, COPD and sleep apnea  last seen 04/07/2017.  Compliance with diet, lifestyle and medications: Yes  His wife died 2 years ago and is living with family.  He is trying to restrict his odium intake takes his diuretic and unfortunately no longer has home care for his lower extremity venous ulcerations.  He is not short of breath no chest pain orthopnea palpitation or syncope.  He no longer is home health care for his lower extremity wounds.  He has had no bleeding complication from his combined clopidogrel DOAC.  EKG 05/01/2020 independently reviewed's atrial fibrillation controlled rate left bundle branch block  TEE 09/07/2018: - Left ventricle: Systolic function was normal. The estimated  ejection fraction was in the range of 55% to 60%.  - Aortic valve: No evidence of vegetation. There was mild  regurgitation.  - Mitral valve: No evidence of vegetation. There was mild to  moderate regurgitation.  - Left atrium: No evidence of thrombus in the atrial cavity or  appendage.  - Right atrium: No evidence of thrombus in the atrial cavity or  appendage.  - Tricuspid valve: No evidence of vegetation. There was moderate  regurgitation.  - Pulmonic valve: No evidence of vegetation.  Past Medical History:  Diagnosis Date  . Abnormal EKG 11/29/2014  . Acute renal insufficiency 11/29/2014  . Acute venous embolism and thrombosis of deep vessels of distal lower extremity ([place]) 11/13/2019   Need to check venous dopplers for possible DVT due to tissure injury  . Angina   . Arthritis    "knees" (01/18/2014)  . Asthma   . Atrial fibrillation ([place])   . Back pain 01/25/2015  . 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 BOOP (bronchiolitis obliterans with organizing pneumonia)  ([place]) 01/20/2014   OLBx 01/20/14 BOOP Started steroids 02/28/14   . CHF (congestive heart failure) ([place])   . Chronic anticoagulation-Xarelto 02/09/2014  . Chronic atrial fibrillation ([place]) 01/03/2014  . Chronic bronchitis ([place]) 12/21/2013  . Chronic diastolic heart failure ([place]) 02/22/2015  . Chronic pain of both knees 03/18/2017   Overview:  Added automatically from request for surgery [id number]  . Chronic respiratory failure ([place]) 04/10/2015  . Chronic respiratory failure with hypoxia ([place]) 02/11/2020  . COPD (chronic obstructive pulmonary disease) ([place]) 01/25/2015  . Cough    coughing up blood- started last nite, seems to be worse now  . CVA (cerebral vascular accident) ([place]) 02/09/2014  . Diabetic polyneuropathy associated with type 2 diabetes mellitus ([place]) 12/21/2013  . Diabetic ulcer of toe of right foot associated with type 2 diabetes mellitus, with necrosis of bone ([place])   . DM (diabetes mellitus), type 2 with renal complications ([place]) 04/07/5783  . Drug induced constipation 11/10/2017  . Dysrhythmia    atrial fib takes cardizem,   . Fibromyalgia   . GERD (gastroesophageal reflux disease)   . H/O hiatal hernia   . Hemoptysis 08/01/2015  . Herpes zoster 06/26/2015  . History of stroke   . Hyperkalemia-repeat pending 11/29/2014  . Hyperlipemia   . Hyperlipidemia   . Hypertension   . Hypertensive heart disease with heart failure ([place])   . Hypothyroidism   . LBBB (left bundle branch block) 02/22/2015  . Long term (current) use of anticoagulants 03/21/2014  . Mild CAD 02/20/2015   Overview:  On recent cardiac cath  Overview:  On recent cardiac cath  . Myocardial infarction [facility])    "one dr says yes; another says no"  . Neuromuscular disorder (HCC)    rls, neuropathy  . Neuropathy    rls, neuropathy   . Normal coronary arteries 2011 11/29/2014  . Obesity (BMI 30-39.9)   . On home oxygen therapy    "2L w/CPAP at bedtime" (09/01/2018)  . OSA on CPAP    cpap & oxygen  . Pericardial effusion     . Peripheral vascular disease ([place])   . Peritoneal effusion, chronic 02/22/2015   Overview:  With surgical window  . PNA (pneumonia) 04/10/2015  . Pneumonia 5/15   "several times" (01/18/2014)  . Precordial pain 01/26/2015  . Preoperative cardiovascular examination 02/24/2016  . Primary osteoarthritis of both knees 03/18/2017   Overview:  Added automatically from request for surgery [id number]  . PVD (peripheral vascular disease) ([place]) 09/28/2018  . Restless leg syndrome   . Sleep apnea    cpap & oxygen   . Stroke [facility]) 2012   denies residual on 01/18/2014  . Troponin level elevated 11/29/2014  . Uncontrolled type 2 diabetes mellitus with diabetic polyneuropathy, with long-term current use of insulin ([place]) 12/21/2013   type ?     Past Surgical History:  Procedure Laterality Date  . ABDOMINAL AORTOGRAM W/LOWER EXTREMITY N/A 09/08/2018   Procedure: ABDOMINAL AORTOGRAM W/LOWER EXTREMITY;  Surgeon:  Marty Heck, MD;  Location: [place] [facility];  Service: Cardiovascular;  Laterality: N/A;  . AMPUTATION Right 09/09/2018   Procedure: AMPUTATION RIGHT GREAT TOE;  Surgeon: Waynetta Sandy, MD;  Location: [place];  Service: Vascular;  Laterality: Right;  . AMPUTATION TOE Right 05/04/2020   Procedure: AMPUTATION TOE RIGHT 2nd TOE;  Surgeon: Evelina Bucy, DPM;  Location: [facility];  Service: Podiatry;  Laterality: Right;  . APPENDECTOMY    . CARDIAC CATHETERIZATION  X 2 then 03/03/2012    NL LVF, normal coronaries, vessels are small ([facility]: Dr. Beatrix Fetters)  . CATARACT EXTRACTION W/ INTRAOCULAR LENS  IMPLANT, BILATERAL Bilateral   . CHOLECYSTECTOMY    . HERNIA REPAIR     UHR  . LAPAROSCOPIC GASTRIC BANDING  2010  . LOWER EXTREMITY ANGIOGRAPHY Left 10/11/2018   Procedure: LOWER EXTREMITY ANGIOGRAPHY;  Surgeon: Waynetta Sandy, MD;  Location: [place] [facility];  Service: Cardiovascular;  Laterality: Left;  . PERICARDIAL WINDOW Left 01/24/2014   Procedure: PERICARDIAL WINDOW;  Surgeon:  Gaye Pollack, MD;  Location: [place];  Service: Thoracic;  Laterality: Left;  . PERIPHERAL VASCULAR INTERVENTION Right 09/08/2018   Procedure: PERIPHERAL VASCULAR INTERVENTION;  Surgeon: Marty Heck, MD;  Location: [place] [facility];  Service: Cardiovascular;  Laterality: Right;  . SINUS EXPLORATION  X 2  . TEE WITHOUT CARDIOVERSION N/A 09/07/2018   Procedure: TRANSESOPHAGEAL ECHOCARDIOGRAM (TEE);  Surgeon: Acie Fredrickson Wonda Cheng, MD;  Location: [place];  Service: Cardiovascular;  Laterality: N/A;  . UMBILICAL HERNIA REPAIR    . VIDEO ASSISTED THORACOSCOPY Left 01/24/2014   Procedure: VIDEO ASSISTED THORACOSCOPY;  Surgeon: Gaye Pollack, MD;  Location: [facility];  Service: Thoracic;  Laterality: Left;  VATS/open lung biopsy  . VIDEO BRONCHOSCOPY Bilateral 12/29/2013   Procedure: VIDEO BRONCHOSCOPY WITHOUT FLUORO;  Surgeon: Elsie Stain, MD;  Location: [facility];  Service: Endoscopy;  Laterality: Bilateral;  . VIDEO BRONCHOSCOPY N/A 01/24/2014   Procedure: VIDEO BRONCHOSCOPY;  Surgeon: Gaye Pollack, MD;  Location: [facility];  Service: Thoracic;  Laterality: N/A;    Current Medications: Current Meds  Medication Sig  . albuterol (PROVENTIL) (2.5 MG/3ML) 0.083% nebulizer solution Take 3 mLs (2.5 mg total) by nebulization 2 (two) times daily. And as needed (Patient taking differently: Take 2.5 mg by nebulization 2 (two) times daily as needed for wheezing. )  . atorvastatin (LIPITOR) 40 MG tablet TAKE 1 TABLET BY MOUTH ONCE DAILY  . Azelastine HCl 0.15 % SOLN Place 2 sprays into both nostrils daily.   . BD PEN NEEDLE NANO 2ND GEN 32G X 4 MM MISC USE DAILY WITH BASAGLAR  . clopidogrel (PLAVIX) 75 MG tablet TAKE 1 TABLET BY MOUTH DAILY  . Continuous Blood Gluc Receiver (DEXCOM G6 RECEIVER) DEVI 1 each by Does not apply route 4 (four) times daily -  before meals and at bedtime.  . Continuous Blood Gluc Sensor (DEXCOM G6 SENSOR) MISC Apply every 10 days  . docusate sodium (COLACE) 100 MG capsule  Take 100 mg by mouth 2 (two) times daily.  . famotidine (PEPCID) 20 MG tablet TAKE 2 TABLETS BY MOUTH TWICE DAILY  . ferrous sulfate 325 (65 FE) MG tablet Take 325 mg by mouth daily.  . Glucagon, rDNA, (GLUCAGON EMERGENCY) 1 MG KIT Inject 1 mg as directed as needed (low blood sugar).   . hydrALAZINE (APRESOLINE) 100 MG tablet Take 50 mg by mouth 2 (two) times daily.  . insulin detemir (LEVEMIR FLEXTOUCH) 100 UNIT/ML FlexPen  Inject 30 Units into the skin daily.  . insulin lispro (HUMALOG) 100 UNIT/ML cartridge SSI ranging up 60 U  . isosorbide mononitrate (IMDUR) 120 MG 24 hr tablet TAKE 1 TABLET BY MOUTH ONCE DAILY IN THEMORNINGS  . lactulose (CHRONULAC) 10 GM/15ML solution TAKE 30MLS BY MOUTH IN THE MORNING AND AT BEDTIME  . levocetirizine (XYZAL) 5 MG tablet Take 1 tablet (5 mg total) by mouth every evening.  . metolazone (ZAROXOLYN) 2.5 MG tablet Take 1 tablet (2.5 mg total) by mouth daily. 3 hours prior to torsemide  . mometasone-formoterol (DULERA) 200-5 MCG/ACT AERO Inhale 2 puffs into the lungs 2 (two) times daily as needed for wheezing.  . nitroGLYCERIN (NITROSTAT) 0.4 MG SL tablet Place 0.4 mg under the tongue every 5 (five) minutes as needed for chest pain.   Marland Kitchen oxyCODONE-acetaminophen (PERCOCET/ROXICET) 5-325 MG tablet TAKE 1 TABLET BY MOUTH EVERY 4 HOURS AS NEEDED FOR SEVERE PAIN  . OXYGEN Inhale into the lungs. 2 liters at bedtime  . pantoprazole (PROTONIX) 40 MG tablet Take 40 mg by mouth in the morning.   . Potassium Chloride ER 20 MEQ TBCR Take 40 mEq by mouth 2 (two) times daily.   . pregabalin (LYRICA) 75 MG capsule Take 1 capsule (75 mg total) by mouth 2 (two) times daily.  Marland Kitchen RELISTOR 150 MG TABS TAKE 3 TABLETS BY MOUTH EVERY MORNING  . Semaglutide, 1 MG/DOSE, (OZEMPIC, 1 MG/DOSE,) 2 MG/1.5ML SOPN Inject 1 mg into the skin once a week.  . torsemide (DEMADEX) 20 MG tablet TAKE 3 TABLETS BY MOUTH AT LUNCH AND TAKE 3 TABLETS AT SUPPER  . Vitamin D, Ergocalciferol, (DRISDOL) 1.25  MG (50000 UNIT) CAPS capsule TAKE 1 CAPSULE BY MOUTH DAILY  . XARELTO 15 MG TABS tablet TAKE 1 TABLET BY MOUTH ONCE DAILY (Patient taking differently: Take 15 mg by mouth at bedtime. )  . XTAMPZA ER 27 MG C12A TAKE 1 CAPSULE BY MOUTH TWICE DAILY  . [DISCONTINUED] diltiazem (CARDIZEM CD) 120 MG 24 hr capsule TAKE 1 CAPSULE BY MOUTH DAILY  . [DISCONTINUED] potassium chloride SA (KLOR-CON) 20 MEQ tablet TAKE TWO TABLETS BY MOUTH TWICE A DAY     Allergies:   Hydrocodone, Morphine, Duloxetine hcl, Codeine, and Penicillins   Social History   Socioeconomic History  . Marital status: Widowed    Spouse name: Not on file  . Number of children: 3  . Years of education: Not on file  . Highest education level: Not on file  Occupational History  . Not on file  Tobacco Use  . Smoking status: Former Smoker    Types: Cigars    Quit date: 09/01/2006    Years since quitting: 13.9  . Smokeless tobacco: Never Used  Vaping Use  . Vaping Use: Never used  Substance and Sexual Activity  . 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 Drug use: No  . Sexual activity: Never  Other Topics Concern  . Not on file  Social History Narrative  . Not on file   Social Determinants of Health   Financial Resource Strain:   . Difficulty of Paying Living Expenses: Not on file  Food Insecurity:   . Worried About Charity fundraiser in the Last Year: Not on file  . Ran Out of Food in the Last Year: Not on file  Transportation Needs: No Transportation Needs  . Lack of Transportation (Medical): No  . Lack of Transportation (Non-Medical): No  Physical Activity: Inactive  . Days of Exercise per Week: 0 days  . Minutes of Exercise per Session: 0 min  Stress:   . Feeling of Stress : Not on file  Social Connections:   . Frequency of Communication with Friends and Family: Not on file  . Frequency of Social Gatherings with Friends and Family: Not on file  . Attends Religious Services:  Not on file  . Active Member of Clubs or Organizations: Not on file  . Attends Archivist Meetings: Not on file  . Marital Status: Not on file     Family History: The patient's family history includes Cancer in his mother; Diabetes in his sister; Heart disease in his father; Seizures in his sister; Stroke in his father. There is no history of Anesthesia problems, Hypotension, Malignant hyperthermia, or Pseudochol deficiency. ROS:   Please see the history of present illness.    All other systems reviewed and are negative.  EKGs/Labs/Other Studies Reviewed:    The following studies were reviewed today:   Recent Labs: 03/27/2020: TSH 1.560 07/05/2020: ALT 13; BUN 24; Creatinine, Ser 2.28; Hemoglobin 14.6; Platelets 316; Potassium 3.7; Sodium 140  Recent Lipid Panel    Component Value Date/Time   CHOL 82 (L) 07/05/2020 0919   TRIG 39 07/05/2020 0919   HDL 39 (L) 07/05/2020 0919   CHOLHDL 2.1 07/05/2020 0919   CHOLHDL 2.9 01/31/2014 0337   VLDL 14 01/31/2014 0337   LDLCALC 32 07/05/2020 0919    Physical Exam:    VS:  BP 128/81   Pulse 78   Ht _0  (1.702 m)   Wt 246 lb 3.2 oz (111.7 kg)   SpO2 98%   BMI 38.56 kg/m     Wt Readings from Last 3 Encounters:  08/07/20 246 lb 3.2 oz (111.7 kg)  07/17/20 249 lb 9.6 oz (113.2 kg)  07/05/20 252 lb (114.3 kg)     GEN: He appears chronically ill well nourished, well developed in no acute distress HEENT: Normal NECK: No JVD; No carotid bruits LYMPHATICS: No lymphadenopathy CARDIAC: S1 variable irregular rhythm RESPIRATORY:  Clear to auscultation without rales, wheezing or rhonchi  ABDOMEN: Soft, non-tender, non-distended MUSCULOSKELETAL: He has tense nonpitting lower extremity edema of the knee edema; No deformity he has venous ulcers that are oozing both lower extremities SKIN: Warm and dry NEUROLOGIC:  Alert and oriented x 3 PSYCHIATRIC:  Normal affect    Signed, Shirlee More, MD  08/07/2020 4:28 PM    [place]

## 2020-08-07 ENCOUNTER — Other Ambulatory Visit: Payer: Self-pay

## 2020-08-07 ENCOUNTER — Ambulatory Visit: Payer: Medicare Other | Admitting: Cardiology

## 2020-08-07 ENCOUNTER — Other Ambulatory Visit: Payer: Self-pay | Admitting: Family Medicine

## 2020-08-07 ENCOUNTER — Encounter: Payer: Self-pay | Admitting: Cardiology

## 2020-08-07 VITALS — BP 128/81 | HR 78 | Ht 67.0 in | Wt 246.2 lb

## 2020-08-07 DIAGNOSIS — I110 Hypertensive heart disease with heart failure: Secondary | ICD-10-CM

## 2020-08-07 DIAGNOSIS — I482 Chronic atrial fibrillation, unspecified: Secondary | ICD-10-CM | POA: Diagnosis not present

## 2020-08-07 DIAGNOSIS — E782 Mixed hyperlipidemia: Secondary | ICD-10-CM

## 2020-08-07 DIAGNOSIS — I2510 Atherosclerotic heart disease of native coronary artery without angina pectoris: Secondary | ICD-10-CM

## 2020-08-07 DIAGNOSIS — J449 Chronic obstructive pulmonary disease, unspecified: Secondary | ICD-10-CM

## 2020-08-07 NOTE — Patient Instructions (Signed)
Medication Instructions:  Your physician has recommended you make the following change in your medication:  STOP: Cardizem  *If you need a refill on your cardiac medications before your next appointment, please call your pharmacy*   Lab Work: None If you have labs (blood work) drawn today and your tests are completely normal, you will receive your results only by: Marland Kitchen MyChart Message (if you have MyChart) OR . A paper copy in the mail If you have any lab test that is abnormal or we need to change your treatment, we will call you to review the results.   Testing/Procedures: Your physician has requested that you have an echocardiogram. Echocardiography is a painless test that uses sound waves to create images of your heart. It provides your doctor with information about the size and shape of your heart and how well your heart's chambers and valves are working. This procedure takes approximately one hour. There are no restrictions for this procedure.     Follow-Up: At [facility], you and your health needs are our priority.  As part of our continuing mission to provide you with exceptional heart care, we have created designated Provider Care Teams.  These Care Teams include your primary Cardiologist (physician) and Advanced Practice Providers (APPs -  Physician Assistants and Nurse Practitioners) who all work together to provide you with the care you need, when you need it.  We recommend signing up for the patient portal called "MyChart".  Sign up information is provided on this After Visit Summary.  MyChart is used to connect with patients for Virtual Visits (Telemedicine).  Patients are able to view lab/test results, encounter notes, upcoming appointments, etc.  Non-urgent messages can be sent to your provider as well.   To learn more about what you can do with MyChart, go to [email or web address].    Your next appointment:   3 month(s)  The format for your next appointment:   In  Person  Provider:   Shirlee More, MD   Other Instructions

## 2020-08-08 DIAGNOSIS — E039 Hypothyroidism, unspecified: Secondary | ICD-10-CM | POA: Diagnosis not present

## 2020-08-08 DIAGNOSIS — I130 Hypertensive heart and chronic kidney disease with heart failure and stage 1 through stage 4 chronic kidney disease, or unspecified chronic kidney disease: Secondary | ICD-10-CM | POA: Diagnosis not present

## 2020-08-08 DIAGNOSIS — E1122 Type 2 diabetes mellitus with diabetic chronic kidney disease: Secondary | ICD-10-CM | POA: Diagnosis not present

## 2020-08-08 DIAGNOSIS — I872 Venous insufficiency (chronic) (peripheral): Secondary | ICD-10-CM | POA: Diagnosis not present

## 2020-08-08 DIAGNOSIS — I482 Chronic atrial fibrillation, unspecified: Secondary | ICD-10-CM | POA: Diagnosis not present

## 2020-08-08 DIAGNOSIS — J9620 Acute and chronic respiratory failure, unspecified whether with hypoxia or hypercapnia: Secondary | ICD-10-CM | POA: Diagnosis not present

## 2020-08-08 DIAGNOSIS — E785 Hyperlipidemia, unspecified: Secondary | ICD-10-CM | POA: Diagnosis not present

## 2020-08-08 DIAGNOSIS — M170 Bilateral primary osteoarthritis of knee: Secondary | ICD-10-CM | POA: Diagnosis not present

## 2020-08-08 DIAGNOSIS — Z8673 Personal history of transient ischemic attack (TIA), and cerebral infarction without residual deficits: Secondary | ICD-10-CM | POA: Diagnosis not present

## 2020-08-08 DIAGNOSIS — I5032 Chronic diastolic (congestive) heart failure: Secondary | ICD-10-CM | POA: Diagnosis not present

## 2020-08-08 DIAGNOSIS — M797 Fibromyalgia: Secondary | ICD-10-CM | POA: Diagnosis not present

## 2020-08-08 DIAGNOSIS — Z9981 Dependence on supplemental oxygen: Secondary | ICD-10-CM | POA: Diagnosis not present

## 2020-08-08 DIAGNOSIS — I252 Old myocardial infarction: Secondary | ICD-10-CM | POA: Diagnosis not present

## 2020-08-08 DIAGNOSIS — G2581 Restless legs syndrome: Secondary | ICD-10-CM | POA: Diagnosis not present

## 2020-08-08 DIAGNOSIS — E1142 Type 2 diabetes mellitus with diabetic polyneuropathy: Secondary | ICD-10-CM | POA: Diagnosis not present

## 2020-08-08 DIAGNOSIS — I824Z2 Acute embolism and thrombosis of unspecified deep veins of left distal lower extremity: Secondary | ICD-10-CM | POA: Diagnosis not present

## 2020-08-08 DIAGNOSIS — Z9884 Bariatric surgery status: Secondary | ICD-10-CM | POA: Diagnosis not present

## 2020-08-08 DIAGNOSIS — N183 Chronic kidney disease, stage 3 unspecified: Secondary | ICD-10-CM | POA: Diagnosis not present

## 2020-08-08 DIAGNOSIS — G4733 Obstructive sleep apnea (adult) (pediatric): Secondary | ICD-10-CM | POA: Diagnosis not present

## 2020-08-08 DIAGNOSIS — K219 Gastro-esophageal reflux disease without esophagitis: Secondary | ICD-10-CM | POA: Diagnosis not present

## 2020-08-08 DIAGNOSIS — E1151 Type 2 diabetes mellitus with diabetic peripheral angiopathy without gangrene: Secondary | ICD-10-CM | POA: Diagnosis not present

## 2020-08-08 DIAGNOSIS — I25119 Atherosclerotic heart disease of native coronary artery with unspecified angina pectoris: Secondary | ICD-10-CM | POA: Diagnosis not present

## 2020-08-08 DIAGNOSIS — J449 Chronic obstructive pulmonary disease, unspecified: Secondary | ICD-10-CM | POA: Diagnosis not present

## 2020-08-13 ENCOUNTER — Other Ambulatory Visit: Payer: Self-pay | Admitting: Legal Medicine

## 2020-08-13 DIAGNOSIS — I110 Hypertensive heart disease with heart failure: Secondary | ICD-10-CM

## 2020-08-14 ENCOUNTER — Other Ambulatory Visit: Payer: Self-pay | Admitting: Family Medicine

## 2020-08-14 DIAGNOSIS — Z9884 Bariatric surgery status: Secondary | ICD-10-CM | POA: Diagnosis not present

## 2020-08-14 DIAGNOSIS — I482 Chronic atrial fibrillation, unspecified: Secondary | ICD-10-CM | POA: Diagnosis not present

## 2020-08-14 DIAGNOSIS — M170 Bilateral primary osteoarthritis of knee: Secondary | ICD-10-CM | POA: Diagnosis not present

## 2020-08-14 DIAGNOSIS — E1122 Type 2 diabetes mellitus with diabetic chronic kidney disease: Secondary | ICD-10-CM | POA: Diagnosis not present

## 2020-08-14 DIAGNOSIS — I130 Hypertensive heart and chronic kidney disease with heart failure and stage 1 through stage 4 chronic kidney disease, or unspecified chronic kidney disease: Secondary | ICD-10-CM | POA: Diagnosis not present

## 2020-08-14 DIAGNOSIS — E1151 Type 2 diabetes mellitus with diabetic peripheral angiopathy without gangrene: Secondary | ICD-10-CM | POA: Diagnosis not present

## 2020-08-14 DIAGNOSIS — J449 Chronic obstructive pulmonary disease, unspecified: Secondary | ICD-10-CM | POA: Diagnosis not present

## 2020-08-14 DIAGNOSIS — I25119 Atherosclerotic heart disease of native coronary artery with unspecified angina pectoris: Secondary | ICD-10-CM | POA: Diagnosis not present

## 2020-08-14 DIAGNOSIS — N183 Chronic kidney disease, stage 3 unspecified: Secondary | ICD-10-CM | POA: Diagnosis not present

## 2020-08-14 DIAGNOSIS — E785 Hyperlipidemia, unspecified: Secondary | ICD-10-CM | POA: Diagnosis not present

## 2020-08-14 DIAGNOSIS — I824Z2 Acute embolism and thrombosis of unspecified deep veins of left distal lower extremity: Secondary | ICD-10-CM | POA: Diagnosis not present

## 2020-08-14 DIAGNOSIS — E039 Hypothyroidism, unspecified: Secondary | ICD-10-CM | POA: Diagnosis not present

## 2020-08-14 DIAGNOSIS — J9620 Acute and chronic respiratory failure, unspecified whether with hypoxia or hypercapnia: Secondary | ICD-10-CM | POA: Diagnosis not present

## 2020-08-14 DIAGNOSIS — E1142 Type 2 diabetes mellitus with diabetic polyneuropathy: Secondary | ICD-10-CM | POA: Diagnosis not present

## 2020-08-14 DIAGNOSIS — I872 Venous insufficiency (chronic) (peripheral): Secondary | ICD-10-CM | POA: Diagnosis not present

## 2020-08-14 DIAGNOSIS — G4733 Obstructive sleep apnea (adult) (pediatric): Secondary | ICD-10-CM | POA: Diagnosis not present

## 2020-08-14 DIAGNOSIS — G2581 Restless legs syndrome: Secondary | ICD-10-CM | POA: Diagnosis not present

## 2020-08-14 DIAGNOSIS — Z8673 Personal history of transient ischemic attack (TIA), and cerebral infarction without residual deficits: Secondary | ICD-10-CM | POA: Diagnosis not present

## 2020-08-14 DIAGNOSIS — I252 Old myocardial infarction: Secondary | ICD-10-CM | POA: Diagnosis not present

## 2020-08-14 DIAGNOSIS — M797 Fibromyalgia: Secondary | ICD-10-CM | POA: Diagnosis not present

## 2020-08-14 DIAGNOSIS — K219 Gastro-esophageal reflux disease without esophagitis: Secondary | ICD-10-CM | POA: Diagnosis not present

## 2020-08-14 DIAGNOSIS — Z9981 Dependence on supplemental oxygen: Secondary | ICD-10-CM | POA: Diagnosis not present

## 2020-08-14 DIAGNOSIS — I5032 Chronic diastolic (congestive) heart failure: Secondary | ICD-10-CM | POA: Diagnosis not present

## 2020-08-16 ENCOUNTER — Encounter: Payer: Self-pay | Admitting: Family Medicine

## 2020-08-16 ENCOUNTER — Telehealth: Payer: Self-pay

## 2020-08-16 ENCOUNTER — Ambulatory Visit (INDEPENDENT_AMBULATORY_CARE_PROVIDER_SITE_OTHER): Payer: Medicare Other | Admitting: Family Medicine

## 2020-08-16 ENCOUNTER — Other Ambulatory Visit: Payer: Self-pay

## 2020-08-16 VITALS — BP 124/68 | HR 72 | Temp 97.4°F | Resp 16 | Ht 67.0 in | Wt 251.0 lb

## 2020-08-16 DIAGNOSIS — N401 Enlarged prostate with lower urinary tract symptoms: Secondary | ICD-10-CM

## 2020-08-16 DIAGNOSIS — I110 Hypertensive heart disease with heart failure: Secondary | ICD-10-CM | POA: Diagnosis not present

## 2020-08-16 DIAGNOSIS — N138 Other obstructive and reflux uropathy: Secondary | ICD-10-CM

## 2020-08-16 DIAGNOSIS — E1129 Type 2 diabetes mellitus with other diabetic kidney complication: Secondary | ICD-10-CM | POA: Diagnosis not present

## 2020-08-16 DIAGNOSIS — R339 Retention of urine, unspecified: Secondary | ICD-10-CM | POA: Diagnosis not present

## 2020-08-16 DIAGNOSIS — E1140 Type 2 diabetes mellitus with diabetic neuropathy, unspecified: Secondary | ICD-10-CM | POA: Diagnosis not present

## 2020-08-16 DIAGNOSIS — Z794 Long term (current) use of insulin: Secondary | ICD-10-CM

## 2020-08-16 NOTE — Progress Notes (Signed)
Subjective:  Patient ID: Jon [name], male    DOB: 1951/06/11  Age: 69 y.o. MRN: [id number]  Chief Complaint  Patient presents with  . Urinary Retention    HPI   He presents complaining of difficulty urinating x 2-3 weeks. No dysuria.  No hematuria. Has urgency, but then cannot go. No fever. No abdominal pain.   Lower Extremity Edema improved with discontinuation of diltiazem. Weeping improved. Swelling improved.  Sugars running 95-200.  Eating 2 meals per day. Not taking mealtime insulin.  Patient has a Dexcom sensor applied to his abdomen but has not been using it. He says he does not know how. He is continue to check his sugars 3 times a day. He says he forgets to take the mealtime insulin.   Current Outpatient Medications on File Prior to Visit  Medication Sig Dispense Refill  . albuterol (PROVENTIL) (2.5 MG/3ML) 0.083% nebulizer solution Take 3 mLs (2.5 mg total) by nebulization 2 (two) times daily. And as needed (Patient taking differently: Take 2.5 mg by nebulization 2 (two) times daily as needed for wheezing. ) 75 mL 5  . atorvastatin (LIPITOR) 40 MG tablet TAKE 1 TABLET BY MOUTH ONCE DAILY 90 tablet 1  . Azelastine HCl 0.15 % SOLN Place 2 sprays into both nostrils daily.     . BD PEN NEEDLE NANO 2ND GEN 32G X 4 MM MISC USE DAILY WITH BASAGLAR 100 each 3  . clopidogrel (PLAVIX) 75 MG tablet TAKE 1 TABLET BY MOUTH DAILY 30 tablet 5  . Continuous Blood Gluc Receiver (DEXCOM G6 RECEIVER) DEVI 1 each by Does not apply route 4 (four) times daily -  before meals and at bedtime. 1 each 0  . Continuous Blood Gluc Sensor (DEXCOM G6 SENSOR) MISC Apply every 10 days 3 each 3  . docusate sodium (COLACE) 100 MG capsule Take 100 mg by mouth 2 (two) times daily.    . famotidine (PEPCID) 20 MG tablet TAKE 2 TABLETS BY MOUTH TWICE DAILY 120 tablet 5  . ferrous sulfate 325 (65 FE) MG tablet Take 325 mg by mouth daily.    . Glucagon, rDNA, (GLUCAGON EMERGENCY) 1 MG KIT Inject 1 mg as  directed as needed (low blood sugar).     . hydrALAZINE (APRESOLINE) 100 MG tablet Take 50 mg by mouth 2 (two) times daily.    . insulin detemir (LEVEMIR FLEXTOUCH) 100 UNIT/ML FlexPen Inject 30 Units into the skin daily.    . insulin lispro (HUMALOG) 100 UNIT/ML cartridge SSI ranging up 60 U 15 mL 2  . isosorbide mononitrate (IMDUR) 120 MG 24 hr tablet TAKE 1 TABLET BY MOUTH ONCE DAILY IN THEMORNINGS 90 tablet 1  . lactulose (CHRONULAC) 10 GM/15ML solution TAKE 30MLS BY MOUTH IN THE MORNING AND AT BEDTIME 946 mL 2  . levocetirizine (XYZAL) 5 MG tablet TAKE 1 TABLET BY MOUTH EVERY EVENING 90 tablet 1  . metolazone (ZAROXOLYN) 2.5 MG tablet TAKE 1 TABLET BY MOUTH DAILY 3 HOURS PRIOR TO LASIX DAILY 36 tablet 5  . mometasone-formoterol (DULERA) 200-5 MCG/ACT AERO Inhale 2 puffs into the lungs 2 (two) times daily as needed for wheezing.    . nitroGLYCERIN (NITROSTAT) 0.4 MG SL tablet Place 0.4 mg under the tongue every 5 (five) minutes as needed for chest pain.     Marland Kitchen oxyCODONE-acetaminophen (PERCOCET/ROXICET) 5-325 MG tablet TAKE 1 TABLET BY MOUTH EVERY 4 HOURS AS NEEDED FOR SEVERE PAIN 20 tablet 0  . OXYGEN Inhale into the lungs. 2 liters  at bedtime    . OZEMPIC, 1 MG/DOSE, 4 MG/3ML SOPN INJECT 1MG INTO THE SKIN ONCE A WEEK 3 mL 2  . pantoprazole (PROTONIX) 40 MG tablet Take 40 mg by mouth in the morning.     . Potassium Chloride ER 20 MEQ TBCR Take 40 mEq by mouth 2 (two) times daily.     . pregabalin (LYRICA) 75 MG capsule Take 1 capsule (75 mg total) by mouth 2 (two) times daily. 180 capsule 0  . RELISTOR 150 MG TABS TAKE 3 TABLETS BY MOUTH EVERY MORNING 90 tablet 3  . sertraline (ZOLOFT) 50 MG tablet Take 1 tablet (50 mg total) by mouth daily. 30 tablet 1  . torsemide (DEMADEX) 20 MG tablet TAKE 3 TABLETS BY MOUTH AT LUNCH AND TAKE 3 TABLETS AT SUPPER 540 tablet 1  . Vitamin D, Ergocalciferol, (DRISDOL) 1.25 MG (50000 UNIT) CAPS capsule TAKE 1 CAPSULE BY MOUTH DAILY 90 capsule 1  . XARELTO 15 MG  TABS tablet TAKE 1 TABLET BY MOUTH ONCE DAILY (Patient taking differently: Take 15 mg by mouth at bedtime. ) 90 tablet 2  . XTAMPZA ER 27 MG C12A TAKE 1 CAPSULE BY MOUTH TWICE DAILY 60 capsule 0   No current facility-administered medications on file prior to visit.   Past Medical History:  Diagnosis Date  . Abnormal EKG 11/29/2014  . Acute renal insufficiency 11/29/2014  . Acute venous embolism and thrombosis of deep vessels of distal lower extremity ([place]) 11/13/2019   Need to check venous dopplers for possible DVT due to tissure injury  . Angina   . Arthritis    "knees" (01/18/2014)  . Asthma   . Atrial fibrillation ([place])   . Back pain 01/25/2015  . 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 BOOP (bronchiolitis obliterans with organizing pneumonia) ([place]) 01/20/2014   OLBx 01/20/14 BOOP Started steroids 02/28/14   . CHF (congestive heart failure) ([place])   . Chronic anticoagulation-Xarelto 02/09/2014  . Chronic atrial fibrillation ([place]) 01/03/2014  . Chronic bronchitis ([place]) 12/21/2013  . Chronic diastolic heart failure ([place]) 02/22/2015  . Chronic pain of both knees 03/18/2017   Overview:  Added automatically from request for surgery [id number]  . Chronic respiratory failure ([place]) 04/10/2015  . Chronic respiratory failure with hypoxia ([place]) 02/11/2020  . COPD (chronic obstructive pulmonary disease) ([place]) 01/25/2015  . Cough    coughing up blood- started last nite, seems to be worse now  . CVA (cerebral vascular accident) ([place]) 02/09/2014  . Diabetic polyneuropathy associated with type 2 diabetes mellitus ([place]) 12/21/2013  . Diabetic ulcer of toe of right foot associated with type 2 diabetes mellitus, with necrosis of bone ([place])   . DM (diabetes mellitus), type 2 with renal complications ([place]) 01/08/1367  . Drug induced constipation 11/10/2017  . Dysrhythmia    atrial fib takes cardizem,   . Fibromyalgia   . GERD (gastroesophageal reflux disease)   . H/O hiatal hernia   . Hemoptysis 08/01/2015  . Herpes  zoster 06/26/2015  . History of stroke   . Hyperkalemia-repeat pending 11/29/2014  . Hyperlipemia   . Hyperlipidemia   . Hypertension   . Hypertensive heart disease with heart failure ([place])   . Hypothyroidism   . LBBB (left bundle branch block) 02/22/2015  . Long term (current) use of anticoagulants 03/21/2014  . Mild CAD 02/20/2015   Overview:  On recent cardiac cath  Overview:  On recent cardiac cath  . Myocardial infarction [facility])    "one dr says yes; another says  no"  . Neuromuscular disorder (HCC)    rls, neuropathy  . Neuropathy    rls, neuropathy   . Normal coronary arteries 2011 11/29/2014  . Obesity (BMI 30-39.9)   . On home oxygen therapy    "2L w/CPAP at bedtime" (09/01/2018)  . OSA on CPAP    cpap & oxygen  . Pericardial effusion   . Peripheral vascular disease ([place])   . Peritoneal effusion, chronic 02/22/2015   Overview:  With surgical window  . PNA (pneumonia) 04/10/2015  . Pneumonia 5/15   "several times" (01/18/2014)  . Precordial pain 01/26/2015  . Preoperative cardiovascular examination 02/24/2016  . Primary osteoarthritis of both knees 03/18/2017   Overview:  Added automatically from request for surgery [id number]  . PVD (peripheral vascular disease) ([place]) 09/28/2018  . Restless leg syndrome   . Sleep apnea    cpap & oxygen   . Stroke [facility]) 2012   denies residual on 01/18/2014  . Troponin level elevated 11/29/2014  . Uncontrolled type 2 diabetes mellitus with diabetic polyneuropathy, with long-term current use of insulin ([place]) 12/21/2013   type ?    Past Surgical History:  Procedure Laterality Date  . ABDOMINAL AORTOGRAM W/LOWER EXTREMITY N/A 09/08/2018   Procedure: ABDOMINAL AORTOGRAM W/LOWER EXTREMITY;  Surgeon: Marty Heck, MD;  Location: [place] [facility];  Service: Cardiovascular;  Laterality: N/A;  . AMPUTATION Right 09/09/2018   Procedure: AMPUTATION RIGHT GREAT TOE;  Surgeon: Waynetta Sandy, MD;  Location: [place];  Service: Vascular;  Laterality:  Right;  . AMPUTATION TOE Right 05/04/2020   Procedure: AMPUTATION TOE RIGHT 2nd TOE;  Surgeon: Evelina Bucy, DPM;  Location: [facility];  Service: Podiatry;  Laterality: Right;  . APPENDECTOMY    . CARDIAC CATHETERIZATION  X 2 then 03/03/2012    NL LVF, normal coronaries, vessels are small ([facility]: Dr. Beatrix Fetters)  . CATARACT EXTRACTION W/ INTRAOCULAR LENS  IMPLANT, BILATERAL Bilateral   . CHOLECYSTECTOMY    . HERNIA REPAIR     UHR  . LAPAROSCOPIC GASTRIC BANDING  2010  . LOWER EXTREMITY ANGIOGRAPHY Left 10/11/2018   Procedure: LOWER EXTREMITY ANGIOGRAPHY;  Surgeon: Waynetta Sandy, MD;  Location: [place] [facility];  Service: Cardiovascular;  Laterality: Left;  . PERICARDIAL WINDOW Left 01/24/2014   Procedure: PERICARDIAL WINDOW;  Surgeon: Gaye Pollack, MD;  Location: [place];  Service: Thoracic;  Laterality: Left;  . PERIPHERAL VASCULAR INTERVENTION Right 09/08/2018   Procedure: PERIPHERAL VASCULAR INTERVENTION;  Surgeon: Marty Heck, MD;  Location: [place] [facility];  Service: Cardiovascular;  Laterality: Right;  . SINUS EXPLORATION  X 2  . TEE WITHOUT CARDIOVERSION N/A 09/07/2018   Procedure: TRANSESOPHAGEAL ECHOCARDIOGRAM (TEE);  Surgeon: Acie Fredrickson Wonda Cheng, MD;  Location: [place];  Service: Cardiovascular;  Laterality: N/A;  . UMBILICAL HERNIA REPAIR    . VIDEO ASSISTED THORACOSCOPY Left 01/24/2014   Procedure: VIDEO ASSISTED THORACOSCOPY;  Surgeon: Gaye Pollack, MD;  [facility];  Service: Thoracic;  Laterality: Left;  VATS/open lung biopsy  . VIDEO BRONCHOSCOPY Bilateral 12/29/2013   Procedure: VIDEO BRONCHOSCOPY WITHOUT FLUORO;  Surgeon: Elsie Stain, MD;  [facility];  Service: Endoscopy;  Laterality: Bilateral;  . VIDEO BRONCHOSCOPY N/A 01/24/2014   Procedure: VIDEO BRONCHOSCOPY;  Surgeon: Gaye Pollack, MD;  Location: [facility] OR;  Service: Thoracic;  Laterality: N/A;    Family History  Problem Relation Age of Onset  . Cancer Mother   . Stroke Father    . Heart disease  Father   . Seizures Sister   . Diabetes Sister   . Anesthesia problems Neg Hx   . Hypotension Neg Hx   . Malignant hyperthermia Neg Hx   . Pseudochol deficiency Neg Hx    Social History   Socioeconomic History  . Marital status: Widowed    Spouse name: Not on file  . Number of children: 3  . Years of education: Not on file  . Highest education level: Not on file  Occupational History  . Not on file  Tobacco Use  . Smoking status: Former Smoker    Types: Cigars    Quit date: 09/01/2006    Years since quitting: 13.9  . Smokeless tobacco: Never Used  Vaping Use  . Vaping Use: Never used  Substance and Sexual Activity  . 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 Drug use: No  . Sexual activity: Never  Other Topics Concern  . Not on file  Social History Narrative  . Not on file   Social Determinants of Health   Financial Resource Strain: Not on file  Food Insecurity: Not on file  Transportation Needs: No Transportation Needs  . Lack of Transportation (Medical): No  . Lack of Transportation (Non-Medical): No  Physical Activity: Inactive  . Days of Exercise per Week: 0 days  . Minutes of Exercise per Session: 0 min  Stress: Not on file  Social Connections: Not on file    Review of Systems  Constitutional: Positive for fatigue. Negative for chills and fever.  HENT: Negative for congestion, rhinorrhea and sore throat.   Respiratory: Positive for shortness of breath. Negative for cough.   Cardiovascular: Negative for chest pain and palpitations.  Gastrointestinal: Positive for constipation (controlled with lactulose.). Negative for abdominal pain, diarrhea, nausea and vomiting.  Genitourinary: Positive for decreased urine volume. Negative for dysuria and urgency.  Musculoskeletal: Positive for arthralgias (bl knees). Negative for back pain and myalgias.  Neurological: Negative for dizziness and headaches.   Psychiatric/Behavioral: Negative for dysphoric mood. The patient is not nervous/anxious.      Objective:  BP 124/68   Pulse 72   Temp (!) 97.4 F (36.3 C)   Resp 16   Ht '5\' 7"'  (1.702 m)   Wt 251 lb (113.9 kg)   BMI 39.31 kg/m   BP/Weight 08/16/2020 08/07/2020 58/05/9832  Systolic BP 825 053 976  Diastolic BP 68 81 80  Wt. (Lbs) 251 246.2 249.6  BMI 39.31 38.56 39.09    Physical Exam Vitals reviewed. Exam conducted with a chaperone present.  Constitutional:      Appearance: Normal appearance. He is obese.  Cardiovascular:     Rate and Rhythm: Normal rate and regular rhythm.     Heart sounds: Normal heart sounds.  Pulmonary:     Effort: Pulmonary effort is normal.     Breath sounds: Normal breath sounds.  Abdominal:     General: Bowel sounds are normal.     Palpations: Abdomen is soft. There is no mass.     Tenderness: There is no abdominal tenderness.  Genitourinary:    Penis: Circumcised.      Prostate: Enlarged. Not tender.     Rectum: Normal.     Comments: Pt unable to give [place] a UA. Attempted to pass foley catheter. Unable to pass it beyond 1 inch up penis. Hit severe resistance.  Neurological:     Mental Status: He is alert.  Diabetic Foot Exam - Simple   No data filed      Lab Results  Component Value Date   WBC 7.1 07/05/2020   HGB 14.6 07/05/2020   HCT 43.3 07/05/2020   PLT 316 07/05/2020   GLUCOSE 88 07/05/2020   CHOL 82 (L) 07/05/2020   TRIG 39 07/05/2020   HDL 39 (L) 07/05/2020   LDLCALC 32 07/05/2020   ALT 13 07/05/2020   AST 21 07/05/2020   NA 140 07/05/2020   K 3.7 07/05/2020   CL 102 07/05/2020   CREATININE 2.28 (H) 07/05/2020   BUN 24 07/05/2020   CO2 21 07/05/2020   TSH 1.560 03/27/2020   INR 1.68 09/01/2018   HGBA1C 9.2 (H) 07/05/2020      Assessment & Plan:  1. Urinary retention - Insert,temp indwelling blad cath,simple Area was cleansed and prepped with betadine. Foley catheter was attempted to be inserted. Unable  to pass further than 1 inch. Hit resistance. I attempted as well as my nurse unsuccessfully. Referred to urology (Dr. Nila Nephew), but he did not have copay of $30, so left.  We spoke with patient after and requested he go to the ED. I discussed with Ulla Gallo, MD at [facility] ED.   2. Type 2 diabetes mellitus with diabetic neuropathy, with long-term current use of insulin ([place]) Pt needs to return for teaching with dexcom. I downloaded dexcom 6 app on his phone, but had no samples to teach with.  I spoke with Sherre Poot, PHD and will work on getting him in next week for teaching.   3. Hypertensive heart disease with heart failure ([place]) At goal. Edema improved. Remain off diltiazem.  4. Benign prostatic hyperplasia with urinary obstruction Enlarged prostate. Could be part of urinary retention, but there is blockage at the distal end of his penis. Concerning for clot or stone or cancer. Sent to ED.    Orders Placed This Encounter  Procedures  . Insert,temp indwelling blad cath,simple     Follow-up: No follow-ups on file.  An After Visit Summary was printed and given to the patient.  Rochel Brome, MD [facility] [phone]

## 2020-08-16 NOTE — Telephone Encounter (Signed)
Patient called stating that he went to Dr. Ara Kussmaul office as advised, however he was not seen due to him not having $30 for a co-pay. Per Dr. Tobie Poet, patient was advised that he needed to go to the ER. Patient stated he will get something to eat and will then go to ER [facility]). Dr. Tobie Poet called the ER and gave report so they are aware when he comes in.

## 2020-08-17 DIAGNOSIS — E1165 Type 2 diabetes mellitus with hyperglycemia: Secondary | ICD-10-CM | POA: Diagnosis not present

## 2020-08-17 DIAGNOSIS — R300 Dysuria: Secondary | ICD-10-CM | POA: Diagnosis not present

## 2020-08-17 DIAGNOSIS — I129 Hypertensive chronic kidney disease with stage 1 through stage 4 chronic kidney disease, or unspecified chronic kidney disease: Secondary | ICD-10-CM | POA: Diagnosis not present

## 2020-08-17 DIAGNOSIS — E876 Hypokalemia: Secondary | ICD-10-CM | POA: Diagnosis not present

## 2020-08-17 DIAGNOSIS — G4733 Obstructive sleep apnea (adult) (pediatric): Secondary | ICD-10-CM | POA: Diagnosis not present

## 2020-08-17 DIAGNOSIS — Z7902 Long term (current) use of antithrombotics/antiplatelets: Secondary | ICD-10-CM | POA: Diagnosis not present

## 2020-08-17 DIAGNOSIS — R350 Frequency of micturition: Secondary | ICD-10-CM | POA: Diagnosis not present

## 2020-08-17 DIAGNOSIS — R338 Other retention of urine: Secondary | ICD-10-CM | POA: Diagnosis not present

## 2020-08-17 DIAGNOSIS — J9611 Chronic respiratory failure with hypoxia: Secondary | ICD-10-CM | POA: Diagnosis not present

## 2020-08-17 DIAGNOSIS — Z7901 Long term (current) use of anticoagulants: Secondary | ICD-10-CM | POA: Diagnosis not present

## 2020-08-17 DIAGNOSIS — Z8673 Personal history of transient ischemic attack (TIA), and cerebral infarction without residual deficits: Secondary | ICD-10-CM | POA: Diagnosis not present

## 2020-08-17 DIAGNOSIS — E785 Hyperlipidemia, unspecified: Secondary | ICD-10-CM | POA: Diagnosis not present

## 2020-08-17 DIAGNOSIS — N179 Acute kidney failure, unspecified: Secondary | ICD-10-CM | POA: Diagnosis not present

## 2020-08-17 DIAGNOSIS — J449 Chronic obstructive pulmonary disease, unspecified: Secondary | ICD-10-CM | POA: Diagnosis not present

## 2020-08-17 DIAGNOSIS — I5030 Unspecified diastolic (congestive) heart failure: Secondary | ICD-10-CM | POA: Diagnosis not present

## 2020-08-17 DIAGNOSIS — E1122 Type 2 diabetes mellitus with diabetic chronic kidney disease: Secondary | ICD-10-CM | POA: Diagnosis not present

## 2020-08-17 DIAGNOSIS — E1142 Type 2 diabetes mellitus with diabetic polyneuropathy: Secondary | ICD-10-CM | POA: Diagnosis not present

## 2020-08-17 DIAGNOSIS — K5730 Diverticulosis of large intestine without perforation or abscess without bleeding: Secondary | ICD-10-CM | POA: Diagnosis not present

## 2020-08-17 DIAGNOSIS — I482 Chronic atrial fibrillation, unspecified: Secondary | ICD-10-CM | POA: Diagnosis not present

## 2020-08-17 DIAGNOSIS — N183 Chronic kidney disease, stage 3 unspecified: Secondary | ICD-10-CM | POA: Diagnosis not present

## 2020-08-18 DIAGNOSIS — I5030 Unspecified diastolic (congestive) heart failure: Secondary | ICD-10-CM | POA: Diagnosis not present

## 2020-08-18 DIAGNOSIS — N179 Acute kidney failure, unspecified: Secondary | ICD-10-CM | POA: Diagnosis not present

## 2020-08-19 DIAGNOSIS — N179 Acute kidney failure, unspecified: Secondary | ICD-10-CM | POA: Diagnosis not present

## 2020-08-19 DIAGNOSIS — I5030 Unspecified diastolic (congestive) heart failure: Secondary | ICD-10-CM | POA: Diagnosis not present

## 2020-08-21 ENCOUNTER — Telehealth: Payer: Self-pay

## 2020-08-21 NOTE — Telephone Encounter (Signed)
°  Transition Care Management Follow-up Telephone Call  Jon [name] April 27, 1951  Admit Date: 08/17/20 Discharge Date: 08/19/20 Diagnoses: Dysuria, Acute Kidney Failure, Urine Retention, BPH   2 day post discharge: 08/21/20 7 day post discharge: 08/26/20 14 day post discharge: 09/02/20  Jon [name] was discharged from [facility] on 08/19/20 with the diagnoses listed above.  He was contacted today via telephone in regards to transition of care.    After being seen on our office for dysuria he refused the urology appointment due to the co-pay.  He was urged to go to the ED.  Creat. found to be 2.60 BUN 35.  Treated with IV fluids to improve renal function.  Urine culture showed <10,000 so UTI was ruled out.  CT Urogram showed thickening of the bladder wall Flomax 0.4 mg PO QD was started for BPH.    BUN 27/Creat 1.70 at discharge.  He agreed to see nephrologist in [place] he saw years ago  Discharge Instructions: Continue Flomax after discharge, follow-up with PCP in one week  !!! Patient states that he has NOT had his Flomax filled so he has not had it since discharge, I advised to him to start it ASAP !!! He is aware to let us know if it is not affordable so we can apply for patient assistance.  Items Reviewed:  Medications reviewed: yes  Allergies reviewed: yes  Dietary changes reviewed: yes  Referrals reviewed: yes    Any patient concerns? no   Confirmed importance and date/time of follow-up visits scheduled yes  Provider Appointment booked with Dr Tobie Poet 08/28/20  Confirmed with patient if condition begins to worsen call PCP or go to the ER.  Patient was given the office number and encouraged to call back with question or concerns.  : yes   Shelle Iron, LPN 79/48/01 65:53 PM

## 2020-08-22 ENCOUNTER — Other Ambulatory Visit: Payer: Self-pay | Admitting: Family Medicine

## 2020-08-22 DIAGNOSIS — E1165 Type 2 diabetes mellitus with hyperglycemia: Secondary | ICD-10-CM

## 2020-08-24 DIAGNOSIS — J9610 Chronic respiratory failure, unspecified whether with hypoxia or hypercapnia: Secondary | ICD-10-CM | POA: Diagnosis not present

## 2020-08-26 DIAGNOSIS — J9610 Chronic respiratory failure, unspecified whether with hypoxia or hypercapnia: Secondary | ICD-10-CM | POA: Diagnosis not present

## 2020-08-27 ENCOUNTER — Other Ambulatory Visit: Payer: Self-pay | Admitting: Family Medicine

## 2020-08-27 ENCOUNTER — Other Ambulatory Visit: Payer: Self-pay

## 2020-08-28 ENCOUNTER — Other Ambulatory Visit: Payer: Self-pay

## 2020-08-28 ENCOUNTER — Ambulatory Visit (INDEPENDENT_AMBULATORY_CARE_PROVIDER_SITE_OTHER): Payer: Medicare Other | Admitting: Family Medicine

## 2020-08-28 ENCOUNTER — Other Ambulatory Visit: Payer: Self-pay | Admitting: Family Medicine

## 2020-08-28 VITALS — BP 130/68 | HR 88 | Temp 97.4°F | Resp 18 | Ht 67.0 in | Wt 253.0 lb

## 2020-08-28 DIAGNOSIS — E876 Hypokalemia: Secondary | ICD-10-CM | POA: Diagnosis not present

## 2020-08-28 DIAGNOSIS — N184 Chronic kidney disease, stage 4 (severe): Secondary | ICD-10-CM

## 2020-08-28 DIAGNOSIS — N401 Enlarged prostate with lower urinary tract symptoms: Secondary | ICD-10-CM | POA: Diagnosis not present

## 2020-08-28 DIAGNOSIS — R338 Other retention of urine: Secondary | ICD-10-CM | POA: Diagnosis not present

## 2020-08-28 MED ORDER — TAMSULOSIN HCL 0.4 MG PO CAPS: 0.8000 mg | ORAL_CAPSULE | Freq: Every day | ORAL | 1 refills | Status: DC

## 2020-08-28 NOTE — Patient Instructions (Addendum)
Increase flomax to 0.4 mg two daily.

## 2020-08-28 NOTE — Progress Notes (Signed)
Subjective:  Patient ID: Jon [name], male    DOB: 05-25-1951  Age: 69 y.o. MRN: [id number]  Chief Complaint  Patient presents with  . Hospitalization Follow-up   HPI TOC performed by Shelle Iron, LPN. Patient was admitted on 08/17/20 and discharged on 08/19/20. Diagnoses: Dysuria, Acute Kidney Failure on CKD, Urine Retention secondary to BPH. TOC was done on 08/21/2020 Pt was seen in our office with urinary retention and we were unable to pass a catheter. I attempted to send to urology, but he was not seen due to finances (he did not have copay.) He then was directed to hospital by our office and he was admitted. Creatining was 2.6 on admission and improved to 1.7 on discharge.  Given IV fluids to improve renal function. This helped. His Urine culture ruled out a UTI.   CT Urogram showed thickening of the bladder wall and patient was given Flomax 0.4 mg PO QD was started for BPH. BUN 27/Creat 1.70 at discharge. Pt previously had seen nephrology in [place] and he needs to see urology. Pt started his flomax but it has not helped enough yet.   Current Outpatient Medications on File Prior to Visit  Medication Sig Dispense Refill  . albuterol (PROVENTIL) (2.5 MG/3ML) 0.083% nebulizer solution Take 3 mLs (2.5 mg total) by nebulization 2 (two) times daily. And as needed (Patient taking differently: Take 2.5 mg by nebulization 2 (two) times daily as needed for wheezing. ) 75 mL 5  . atorvastatin (LIPITOR) 40 MG tablet TAKE 1 TABLET BY MOUTH ONCE DAILY 90 tablet 1  . Azelastine HCl 0.15 % SOLN USE 1 SPRAY IN EACH NOSTRIL TWICE DAILY 30 mL 11  . BD PEN NEEDLE NANO 2ND GEN 32G X 4 MM MISC USE DAILY WITH BASAGLAR 100 each 3  . clopidogrel (PLAVIX) 75 MG tablet TAKE 1 TABLET BY MOUTH DAILY 30 tablet 5  . Continuous Blood Gluc Receiver (DEXCOM G6 RECEIVER) DEVI 1 each by Does not apply route 4 (four) times daily -  before meals and at bedtime. 1 each 0  . Continuous Blood Gluc Sensor (DEXCOM G6 SENSOR)  MISC Apply every 10 days 3 each 3  . docusate sodium (COLACE) 100 MG capsule Take 100 mg by mouth 2 (two) times daily.    . famotidine (PEPCID) 20 MG tablet TAKE 2 TABLETS BY MOUTH TWICE DAILY 120 tablet 5  . ferrous sulfate 325 (65 FE) MG tablet Take 325 mg by mouth daily.    . Glucagon, rDNA, (GLUCAGON EMERGENCY) 1 MG KIT Inject 1 mg as directed as needed (low blood sugar).     . hydrALAZINE (APRESOLINE) 100 MG tablet Take 50 mg by mouth 2 (two) times daily.    . insulin detemir (LEVEMIR FLEXTOUCH) 100 UNIT/ML FlexPen Inject 30 Units into the skin daily.    . insulin lispro (HUMALOG KWIKPEN) 100 UNIT/ML KwikPen PER SLIDING SCALE ADMINSTER AS FOLLOWS: >150-200 2 U,201-250 4 U,251-300 8 U, 301-350 12 U,351-400 16 U,>400 20 U 15 mL 1  . isosorbide mononitrate (IMDUR) 120 MG 24 hr tablet TAKE 1 TABLET BY MOUTH ONCE DAILY IN THEMORNINGS 90 tablet 1  . lactulose (CHRONULAC) 10 GM/15ML solution TAKE 30MLS BY MOUTH IN THE MORNING AND AT BEDTIME 946 mL 2  . levocetirizine (XYZAL) 5 MG tablet TAKE 1 TABLET BY MOUTH EVERY EVENING 90 tablet 1  . metolazone (ZAROXOLYN) 2.5 MG tablet TAKE 1 TABLET BY MOUTH DAILY 3 HOURS PRIOR TO LASIX DAILY 36 tablet 5  .  mometasone-formoterol (DULERA) 200-5 MCG/ACT AERO Inhale 2 puffs into the lungs 2 (two) times daily as needed for wheezing.    Marland Kitchen MOVANTIK 25 MG TABS tablet     . nitroGLYCERIN (NITROSTAT) 0.4 MG SL tablet Place 0.4 mg under the tongue every 5 (five) minutes as needed for chest pain.     Marland Kitchen oxyCODONE-acetaminophen (PERCOCET/ROXICET) 5-325 MG tablet TAKE 1 TABLET BY MOUTH EVERY 4 HOURS AS NEEDED FOR SEVERE PAIN 20 tablet 0  . OXYGEN Inhale into the lungs. 2 liters at bedtime    . OZEMPIC, 1 MG/DOSE, 4 MG/3ML SOPN INJECT 1MG INTO THE SKIN ONCE A WEEK 3 mL 2  . pantoprazole (PROTONIX) 40 MG tablet Take 40 mg by mouth in the morning.     . Potassium Chloride ER 20 MEQ TBCR Take 40 mEq by mouth 2 (two) times daily.     . pregabalin (LYRICA) 75 MG capsule Take 1  capsule (75 mg total) by mouth 2 (two) times daily. 180 capsule 0  . RELISTOR 150 MG TABS TAKE 3 TABLETS BY MOUTH EVERY MORNING 90 tablet 3  . sertraline (ZOLOFT) 50 MG tablet Take 1 tablet (50 mg total) by mouth daily. 30 tablet 1  . tamsulosin (FLOMAX) 0.4 MG CAPS capsule Take 0.4 mg by mouth daily.    Marland Kitchen torsemide (DEMADEX) 20 MG tablet TAKE 3 TABLETS BY MOUTH AT LUNCH AND TAKE 3 TABLETS AT SUPPER 540 tablet 1  . Vitamin D, Ergocalciferol, (DRISDOL) 1.25 MG (50000 UNIT) CAPS capsule TAKE 1 CAPSULE BY MOUTH DAILY 90 capsule 1  . XARELTO 15 MG TABS tablet TAKE 1 TABLET BY MOUTH ONCE DAILY (Patient taking differently: Take 15 mg by mouth at bedtime. ) 90 tablet 2   No current facility-administered medications on file prior to visit.   Past Medical History:  Diagnosis Date  . Abnormal EKG 11/29/2014  . Acute renal insufficiency 11/29/2014  . Acute venous embolism and thrombosis of deep vessels of distal lower extremity ([place]) 11/13/2019   Need to check venous dopplers for possible DVT due to tissure injury  . Angina   . Arthritis    "knees" (01/18/2014)  . Asthma   . Atrial fibrillation ([place])   . Back pain 01/25/2015  . 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 BOOP (bronchiolitis obliterans with organizing pneumonia) ([place]) 01/20/2014   OLBx 01/20/14 BOOP Started steroids 02/28/14   . CHF (congestive heart failure) ([place])   . Chronic anticoagulation-Xarelto 02/09/2014  . Chronic atrial fibrillation ([place]) 01/03/2014  . Chronic bronchitis ([place]) 12/21/2013  . Chronic diastolic heart failure ([place]) 02/22/2015  . Chronic pain of both knees 03/18/2017   Overview:  Added automatically from request for surgery [id number]  . Chronic respiratory failure ([place]) 04/10/2015  . Chronic respiratory failure with hypoxia ([place]) 02/11/2020  . COPD (chronic obstructive pulmonary disease) ([place]) 01/25/2015  . Cough    coughing up blood- started last nite, seems to be worse now  . CVA (cerebral vascular accident) ([place]) 02/09/2014  .  Diabetic polyneuropathy associated with type 2 diabetes mellitus ([place]) 12/21/2013  . Diabetic ulcer of toe of right foot associated with type 2 diabetes mellitus, with necrosis of bone ([place])   . DM (diabetes mellitus), type 2 with renal complications ([place]) 03/06/8831  . Drug induced constipation 11/10/2017  . Dysrhythmia    atrial fib takes cardizem,   . Fibromyalgia   . GERD (gastroesophageal reflux disease)   . H/O hiatal hernia   . Hemoptysis 08/01/2015  . Herpes zoster 06/26/2015  .  History of stroke   . Hyperkalemia-repeat pending 11/29/2014  . Hyperlipemia   . Hyperlipidemia   . Hypertension   . Hypertensive heart disease with heart failure ([place])   . Hypothyroidism   . LBBB (left bundle branch block) 02/22/2015  . Long term (current) use of anticoagulants 03/21/2014  . Mild CAD 02/20/2015   Overview:  On recent cardiac cath  Overview:  On recent cardiac cath  . Myocardial infarction [facility])    "one dr says yes; another says no"  . Neuromuscular disorder (HCC)    rls, neuropathy  . Neuropathy    rls, neuropathy   . Normal coronary arteries 2011 11/29/2014  . Obesity (BMI 30-39.9)   . On home oxygen therapy    "2L w/CPAP at bedtime" (09/01/2018)  . OSA on CPAP    cpap & oxygen  . Pericardial effusion   . Peripheral vascular disease ([place])   . Peritoneal effusion, chronic 02/22/2015   Overview:  With surgical window  . PNA (pneumonia) 04/10/2015  . Pneumonia 5/15   "several times" (01/18/2014)  . Precordial pain 01/26/2015  . Preoperative cardiovascular examination 02/24/2016  . Primary osteoarthritis of both knees 03/18/2017   Overview:  Added automatically from request for surgery [id number]  . PVD (peripheral vascular disease) ([place]) 09/28/2018  . Restless leg syndrome   . Sleep apnea    cpap & oxygen   . Stroke [facility]) 2012   denies residual on 01/18/2014  . Troponin level elevated 11/29/2014  . Uncontrolled type 2 diabetes mellitus with diabetic polyneuropathy, with long-term current  use of insulin ([place]) 12/21/2013   type ?    Past Surgical History:  Procedure Laterality Date  . ABDOMINAL AORTOGRAM W/LOWER EXTREMITY N/A 09/08/2018   Procedure: ABDOMINAL AORTOGRAM W/LOWER EXTREMITY;  Surgeon: Marty Heck, MD;  Location: [place] [facility];  Service: Cardiovascular;  Laterality: N/A;  . AMPUTATION Right 09/09/2018   Procedure: AMPUTATION RIGHT GREAT TOE;  Surgeon: Waynetta Sandy, MD;  Location: [place];  Service: Vascular;  Laterality: Right;  . AMPUTATION TOE Right 05/04/2020   Procedure: AMPUTATION TOE RIGHT 2nd TOE;  Surgeon: Evelina Bucy, DPM;  Location: [facility];  Service: Podiatry;  Laterality: Right;  . APPENDECTOMY    . CARDIAC CATHETERIZATION  X 2 then 03/03/2012    NL LVF, normal coronaries, vessels are small ([facility]: Dr. Beatrix Fetters)  . CATARACT EXTRACTION W/ INTRAOCULAR LENS  IMPLANT, BILATERAL Bilateral   . CHOLECYSTECTOMY    . HERNIA REPAIR     UHR  . LAPAROSCOPIC GASTRIC BANDING  2010  . LOWER EXTREMITY ANGIOGRAPHY Left 10/11/2018   Procedure: LOWER EXTREMITY ANGIOGRAPHY;  Surgeon: Waynetta Sandy, MD;  Location: [place] [facility];  Service: Cardiovascular;  Laterality: Left;  . PERICARDIAL WINDOW Left 01/24/2014   Procedure: PERICARDIAL WINDOW;  Surgeon: Gaye Pollack, MD;  Location: [place];  Service: Thoracic;  Laterality: Left;  . PERIPHERAL VASCULAR INTERVENTION Right 09/08/2018   Procedure: PERIPHERAL VASCULAR INTERVENTION;  Surgeon: Marty Heck, MD;  Location: [place] [facility];  Service: Cardiovascular;  Laterality: Right;  . SINUS EXPLORATION  X 2  . TEE WITHOUT CARDIOVERSION N/A 09/07/2018   Procedure: TRANSESOPHAGEAL ECHOCARDIOGRAM (TEE);  Surgeon: Acie Fredrickson Wonda Cheng, MD;  Location: [place];  Service: Cardiovascular;  Laterality: N/A;  . UMBILICAL HERNIA REPAIR    . VIDEO ASSISTED THORACOSCOPY Left 01/24/2014   Procedure: VIDEO ASSISTED THORACOSCOPY;  Surgeon: Gaye Pollack, MD;  Location: [place];  Service: Thoracic;   Laterality:  Left;  VATS/open lung biopsy  . VIDEO BRONCHOSCOPY Bilateral 12/29/2013   Procedure: VIDEO BRONCHOSCOPY WITHOUT FLUORO;  Surgeon: Elsie Stain, MD;  Location: [facility];  Service: Endoscopy;  Laterality: Bilateral;  . VIDEO BRONCHOSCOPY N/A 01/24/2014   Procedure: VIDEO BRONCHOSCOPY;  Surgeon: Gaye Pollack, MD;  Location[facility];  Service: Thoracic;  Laterality: N/A;    Family History  Problem Relation Age of Onset  . Cancer Mother   . Stroke Father   . Heart disease Father   . Seizures Sister   . Diabetes Sister   . Anesthesia problems Neg Hx   . Hypotension Neg Hx   . Malignant hyperthermia Neg Hx   . Pseudochol deficiency Neg Hx    Social History   Socioeconomic History  . Marital status: Widowed    Spouse name: Not on file  . Number of children: 3  . Years of education: Not on file  . Highest education level: Not on file  Occupational History  . Not on file  Tobacco Use  . Smoking status: Former Smoker    Types: Cigars    Quit date: 09/01/2006    Years since quitting: 14.0  . Smokeless tobacco: Never Used  Vaping Use  . Vaping Use: Never used  Substance and Sexual Activity  . 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 Drug use: No  . Sexual activity: Never  Other Topics Concern  . Not on file  Social History Narrative  . Not on file   Social Determinants of Health   Financial Resource Strain: Not on file  Food Insecurity: Not on file  Transportation Needs: No Transportation Needs  . Lack of Transportation (Medical): No  . Lack of Transportation (Non-Medical): No  Physical Activity: Inactive  . Days of Exercise per Week: 0 days  . Minutes of Exercise per Session: 0 min  Stress: Not on file  Social Connections: Not on file    Review of Systems  Constitutional: Negative for chills and fever.  HENT: Negative for congestion, rhinorrhea and sore throat.   Eyes: Positive for visual disturbance (blurry Bl.  Needs to see eye doctor. ).  Respiratory: Positive for shortness of breath. Negative for cough.   Cardiovascular: Negative for chest pain and palpitations.  Gastrointestinal: Positive for constipation (controlled with lactulose). Negative for abdominal pain, diarrhea, nausea and vomiting.  Endocrine: Positive for polydipsia. Negative for polyphagia and polyuria.  Genitourinary: Positive for difficulty urinating. Negative for dysuria and urgency.  Musculoskeletal: Negative for arthralgias, back pain and myalgias.  Neurological: Positive for weakness. Negative for dizziness and headaches.  Psychiatric/Behavioral: Negative for dysphoric mood. The patient is not nervous/anxious.      Objective:  BP 130/68   Pulse 88   Temp (!) 97.4 F (36.3 C)   Resp 18   Ht _0  (1.702 m)   Wt 253 lb (114.8 kg)   BMI 39.63 kg/m   BP/Weight 08/28/2020 08/16/2020 97/05/8920  Systolic BP 194 174 081  Diastolic BP 68 68 81  Wt. (Lbs) 253 251 246.2  BMI 39.63 39.31 38.56    Physical Exam Vitals reviewed.  Constitutional:      Appearance: Normal appearance. He is obese.  Cardiovascular:     Rate and Rhythm: Normal rate and regular rhythm.     Heart sounds: Normal heart sounds.  Pulmonary:     Effort: Pulmonary effort is normal.     Breath sounds: Normal breath  sounds.  Abdominal:     General: Bowel sounds are normal. There is no distension.     Tenderness: There is no abdominal tenderness.  Neurological:     Mental Status: He is alert.     Lab Results  Component Value Date   WBC 7.1 07/05/2020   HGB 14.6 07/05/2020   HCT 43.3 07/05/2020   PLT 316 07/05/2020   GLUCOSE 146 (H) 08/28/2020   CHOL 82 (L) 07/05/2020   TRIG 39 07/05/2020   HDL 39 (L) 07/05/2020   LDLCALC 32 07/05/2020   ALT 10 08/28/2020   AST 16 08/28/2020   NA 139 08/28/2020   K 3.2 (L) 08/28/2020   CL 96 08/28/2020   CREATININE 2.14 (H) 08/28/2020   BUN 25 08/28/2020   CO2 25 08/28/2020   TSH 1.560 03/27/2020    INR 1.68 09/01/2018   HGBA1C 9.2 (H) 07/05/2020      Assessment & Plan:   1. Benign prostatic hyperplasia with urinary retention - Increase flomax to 0.4 mg 2 daily at night - PSA - Ambulatory referral to Urology  2. Hypokalemia - Comprehensive metabolic panel  3. Chronic kidney disease (CKD), active medical management without dialysis, stage 4 (severe) (HCC) Pt had acute on CKD. Improved prior to discharge. Due to postnephrotic blockage due to BPH.  - Ambulatory referral to Nephrology    Meds ordered this encounter  Medications  . tamsulosin (FLOMAX) 0.4 MG CAPS capsule    Sig: Take 2 capsules (0.8 mg total) by mouth daily after supper.    Dispense:  60 capsule    Refill:  1    Orders Placed This Encounter  Procedures  . Comprehensive metabolic panel  . PSA  . Ambulatory referral to Urology  . Ambulatory referral to Nephrology    Follow-up: Return if symptoms worsen or fail to improve.  An After Visit Summary was printed and given to the patient.  Rochel Brome, MD [facility] [phone]

## 2020-08-29 LAB — COMPREHENSIVE METABOLIC PANEL
ALT: 10 IU/L (ref 0–44)
AST: 16 IU/L (ref 0–40)
Albumin/Globulin Ratio: 1.4 (ref 1.2–2.2)
Albumin: 3.5 g/dL — ABNORMAL LOW (ref 3.7–4.7)
Alkaline Phosphatase: 115 IU/L (ref 44–121)
BUN/Creatinine Ratio: 12 (ref 10–24)
BUN: 25 mg/dL (ref 8–27)
Bilirubin Total: 0.5 mg/dL (ref 0.0–1.2)
CO2: 25 mmol/L (ref 20–29)
Calcium: 8.1 mg/dL — ABNORMAL LOW (ref 8.6–10.2)
Chloride: 96 mmol/L (ref 96–106)
Creatinine, Ser: 2.14 mg/dL — ABNORMAL HIGH (ref 0.76–1.27)
GFR calc Af Amer: 35 mL/min/{1.73_m2} — ABNORMAL LOW (ref >59)
GFR calc non Af Amer: 30 mL/min/{1.73_m2} — ABNORMAL LOW (ref >59)
Globulin, Total: 2.5 g/dL (ref 1.5–4.5)
Glucose: 146 mg/dL — ABNORMAL HIGH (ref 65–99)
Potassium: 3.2 mmol/L — ABNORMAL LOW (ref 3.5–5.2)
Sodium: 139 mmol/L (ref 134–144)
Total Protein: 6 g/dL (ref 6.0–8.5)

## 2020-08-29 LAB — PSA: Prostate Specific Ag, Serum: 1.1 ng/mL (ref 0.0–4.0)

## 2020-09-03 ENCOUNTER — Encounter: Payer: Self-pay | Admitting: Family Medicine

## 2020-09-03 ENCOUNTER — Other Ambulatory Visit: Payer: Self-pay | Admitting: Family Medicine

## 2020-09-06 ENCOUNTER — Ambulatory Visit (INDEPENDENT_AMBULATORY_CARE_PROVIDER_SITE_OTHER): Payer: Medicare HMO

## 2020-09-06 ENCOUNTER — Other Ambulatory Visit: Payer: Self-pay

## 2020-09-06 DIAGNOSIS — I110 Hypertensive heart disease with heart failure: Secondary | ICD-10-CM

## 2020-09-06 LAB — ECHOCARDIOGRAM COMPLETE
Area-P 1/2: 5.27 cm2
S' Lateral: 2.45 cm

## 2020-09-06 MED ORDER — PERFLUTREN LIPID MICROSPHERE: 1.0000 mL | INTRAVENOUS | Status: AC | PRN

## 2020-09-06 NOTE — Progress Notes (Signed)
Complete echocardiogram with contrast performed.  Jimmy Nadeen Shipman RDCS, RVT  

## 2020-09-10 ENCOUNTER — Telehealth: Payer: Medicare Other

## 2020-09-10 NOTE — Chronic Care Management (AMB) (Deleted)
Chronic Care Management Pharmacy  Name: Jon [name]  MRN: [id number] DOB: 29-Jul-1951  Chief Complaint/ HPI  Jon [name],  70 y.o. , male presents for their Follow-Up CCM visit with the clinical pharmacist via telephone due to COVID-19 Pandemic.  PCP : Rochel Brome, MD   Recommended Plan:   Patient has several fasting blood sugar below 100 mg/dL. Recommend reducing Levemir dose to 26 units daily. Patient has not set up his Dexcom yet but will call with information to link to clinic account.   Patient states that he does not have wound care home healthy. Says his leg is dry and scabbing.   Their chronic conditions include: Hypothyroidism, GERD, Afib, DM, Neuropathy, RLS, HLD, Vitamin D insuficiency, anemia, OA of both knees, hx of stroke.  Office Visits: 08/28/2020 - referral to urology. Referral to nephrology.  08/16/2020 - patient referred to urology but could not afford. Referred to ED for urinary retention. Teaching with Dexcom needed.  Consult Visit: 08/07/2020 - CCB discontinued.   Medications: Outpatient Encounter Medications as of 09/10/2020  Medication Sig  . XTAMPZA ER 27 MG C12A TAKE 1 CAPSULE BY MOUTH TWICE DAILY  . albuterol (PROVENTIL) (2.5 MG/3ML) 0.083% nebulizer solution Take 3 mLs (2.5 mg total) by nebulization 2 (two) times daily. And as needed (Patient taking differently: Take 2.5 mg by nebulization 2 (two) times daily as needed for wheezing. )  . atorvastatin (LIPITOR) 40 MG tablet TAKE 1 TABLET BY MOUTH ONCE DAILY  . Azelastine HCl 0.15 % SOLN USE 1 SPRAY IN EACH NOSTRIL TWICE DAILY  . BD PEN NEEDLE NANO 2ND GEN 32G X 4 MM MISC USE DAILY WITH BASAGLAR  . clopidogrel (PLAVIX) 75 MG tablet TAKE 1 TABLET BY MOUTH DAILY  . Continuous Blood Gluc Receiver (DEXCOM G6 RECEIVER) DEVI 1 each by Does not apply route 4 (four) times daily -  before meals and at bedtime.  . Continuous Blood Gluc Sensor (DEXCOM G6 SENSOR) MISC Apply every 10 days  . docusate sodium  (COLACE) 100 MG capsule Take 100 mg by mouth 2 (two) times daily.  . famotidine (PEPCID) 20 MG tablet TAKE 2 TABLETS BY MOUTH TWICE DAILY  . ferrous sulfate 325 (65 FE) MG tablet Take 325 mg by mouth daily.  . Glucagon, rDNA, (GLUCAGON EMERGENCY) 1 MG KIT Inject 1 mg as directed as needed (low blood sugar).   . hydrALAZINE (APRESOLINE) 100 MG tablet TAKE ONE (1) TABLET BY MOUTH 3 TIMES DAILY. DISCONTINE DILTIAZEM.  Marland Kitchen insulin detemir (LEVEMIR FLEXTOUCH) 100 UNIT/ML FlexPen Inject 30 Units into the skin daily.  . insulin lispro (HUMALOG KWIKPEN) 100 UNIT/ML KwikPen PER SLIDING SCALE ADMINSTER AS FOLLOWS: >150-200 2 U,201-250 4 U,251-300 8 U, 301-350 12 U,351-400 16 U,>400 20 U  . isosorbide mononitrate (IMDUR) 120 MG 24 hr tablet TAKE 1 TABLET BY MOUTH ONCE DAILY IN THEMORNINGS  . lactulose (CHRONULAC) 10 GM/15ML solution TAKE 30MLS BY MOUTH IN THE MORNING AND AT BEDTIME  . levocetirizine (XYZAL) 5 MG tablet TAKE 1 TABLET BY MOUTH EVERY EVENING  . metolazone (ZAROXOLYN) 2.5 MG tablet TAKE 1 TABLET BY MOUTH DAILY 3 HOURS PRIOR TO LASIX DAILY  . mometasone-formoterol (DULERA) 200-5 MCG/ACT AERO Inhale 2 puffs into the lungs 2 (two) times daily as needed for wheezing.  Marland Kitchen MOVANTIK 25 MG TABS tablet   . nitroGLYCERIN (NITROSTAT) 0.4 MG SL tablet Place 0.4 mg under the tongue every 5 (five) minutes as needed for chest pain.   Marland Kitchen oxyCODONE-acetaminophen (PERCOCET/ROXICET) 5-325 MG  tablet TAKE 1 TABLET BY MOUTH EVERY 4 HOURS AS NEEDED FOR SEVERE PAIN  . OXYGEN Inhale into the lungs. 2 liters at bedtime  . OZEMPIC, 1 MG/DOSE, 4 MG/3ML SOPN INJECT 1MG INTO THE SKIN ONCE A WEEK  . pantoprazole (PROTONIX) 40 MG tablet Take 40 mg by mouth in the morning.   . Potassium Chloride ER 20 MEQ TBCR Take 40 mEq by mouth 2 (two) times daily.   . pregabalin (LYRICA) 75 MG capsule Take 1 capsule (75 mg total) by mouth 2 (two) times daily.  Marland Kitchen RELISTOR 150 MG TABS TAKE 3 TABLETS BY MOUTH EVERY MORNING  . sertraline (ZOLOFT)  50 MG tablet Take 1 tablet (50 mg total) by mouth daily.  . tamsulosin (FLOMAX) 0.4 MG CAPS capsule Take 0.4 mg by mouth daily.  . tamsulosin (FLOMAX) 0.4 MG CAPS capsule Take 2 capsules (0.8 mg total) by mouth daily after supper.  . torsemide (DEMADEX) 20 MG tablet TAKE 3 TABLETS BY MOUTH AT LUNCH AND TAKE 3 TABLETS AT SUPPER  . Vitamin D, Ergocalciferol, (DRISDOL) 1.25 MG (50000 UNIT) CAPS capsule TAKE 1 CAPSULE BY MOUTH DAILY  . XARELTO 15 MG TABS tablet TAKE 1 TABLET BY MOUTH ONCE DAILY (Patient taking differently: Take 15 mg by mouth at bedtime. )   No facility-administered encounter medications on file as of 09/10/2020.   Allergies  Allergen Reactions  . Hydrocodone Itching  . Morphine Itching  . Duloxetine Hcl Other (See Comments)    Unknown reaction  . Codeine Itching  . Penicillins Itching and Rash    DID THE REACTION INVOLVE: Swelling of the face/tongue/throat, SOB, or low BP? No Sudden or severe rash/hives, skin peeling, or the inside of the mouth or nose? No Did it require medical treatment? No When did it last happen?unknown If all above answers are "NO", may proceed with cephalosporin use.    SDOH Screenings   Alcohol Screen: Not on file  Depression (PHQ2-9): Low Risk   . PHQ-2 Score: 0  Financial Resource Strain: Not on file  Food Insecurity: Not on file  Housing: Low Risk   . Last Housing Risk Score: 0  Physical Activity: Inactive  . Days of Exercise per Week: 0 days  . Minutes of Exercise per Session: 0 min  Social Connections: Not on file  Stress: Not on file  Tobacco Use: Medium Risk  . Smoking Tobacco Use: Former Smoker  . Smokeless Tobacco Use: Never Used  Transportation Needs: No Transportation Needs  . Lack of Transportation (Medical): No  . Lack of Transportation (Non-Medical): No    Current Diagnosis/Assessment:  Goals Addressed   None     Diabetes   Recent Relevant Labs: Lab Results  Component Value Date/Time   HGBA1C 9.2 (H)  07/05/2020 09:19 AM   HGBA1C 8.8 (H) 03/27/2020 02:40 PM    Kidney Function Lab Results  Component Value Date/Time   CREATININE 2.14 (H) 08/28/2020 02:10 PM   CREATININE 2.28 (H) 07/05/2020 09:19 AM   GFRNONAA 30 (L) 08/28/2020 02:10 PM   GFRAA 35 (L) 08/28/2020 02:10 PM    Checking BG: 3x per Day  Recent FBG Readings: 73, 94, 90, 107, 126, 100, 151, 78, 76 Recent 4 pm Readings: 142, 176, 203, 209, 149, 111 Recent HS BG readings: 228, 229, 208, 226, 237 188  Patient has failed these meds in past: novolog, humulin, metformin, novolog  Patient is currently uncontrolled on the following medications:   glucagon kit  levemir flextouch 26 units daily  Ozempic 1 mg weekly   Humalog sliding scale with meals  Last diabetic Foot exam: 01/17/2020 Last diabetic Eye exam: 07/2020  We discussed: Patient's recent fasting blood sugars have been mainly below goal. He feels bad when numbers are less than 100 mg daily. Recommend reducing Levemir to 26 units daily from 30 units to help prevent low blood sugar readings.   Patient reports that he will call pharmacist next week if fasting blood sugars not improving. Patient has a Dexcom but has not set it up. States that his stepdaughter will be assisting with this. Pharmacist instructed patient to call when he has meter set up to begin sharing with clinic Clarity account. Patient acknowledges understanding.   Patient states that legs are dry and scabby. He does not have current nursing home health for wound care.    Plan Recommend decreasing Levermir to 26 units daily to reduce low fasting blood sugar. Continue sliding scale insulin.   Patient to set up Dexcom CGM to begin tracking blood sugar and for help with alerting with low blood sugar.     Hyperlipidemia   Lipid Panel     Component Value Date/Time   CHOL 82 (L) 07/05/2020 0919   TRIG 39 07/05/2020 0919   HDL 39 (L) 07/05/2020 0919   LDLCALC 32 07/05/2020 0919     The ASCVD  Risk score (Goff DC Jr., et al., 2013) failed to calculate for the following reasons:   The patient has a prior MI or stroke diagnosis   Patient has failed these meds in past: n/a Patient is currently controlled on the following medications:  . Atorvastatin 40 mg daily  We discussed:  diet and exercise extensively. Not really eating a heart healthy diet. Eats whatever his family fixes. Patient's fill history indicates good adherence to regimen. <5 day gap in fills of atorvastatin.   Plan  Continue current medications   Depression    Depression screen [facility] 2/9 08/28/2020 08/16/2020 08/02/2020 07/17/2020 07/05/2020  Decreased Interest 0 0 2 2 0  Down, Depressed, Hopeless 0 0 2 2 0  PHQ - 2 Score 0 0 4 4 0  Altered sleeping - - 0 0 -  Tired, decreased energy - - 3 2 -  Change in appetite - - 1 2 -  Feeling bad or failure about yourself  - - 0 0 -  Trouble concentrating - - 0 0 -  Moving slowly or fidgety/restless - - 0 0 -  Suicidal thoughts - - 0 0 -  PHQ-9 Score - - 8 8 -  Difficult doing work/chores - - - Somewhat difficult -  Some recent data might be hidden     Patient has failed these meds in past: amitriptyline Patient is currently uncontrolled on the following medications:  . Sertraline 50 mg daily   We discussed:  Patient reports good days and bad. Denies suicide ideations. Denies side effects from medication. Will see Dr. Tobie Poet in 2 weeks to assess.   Plan  Continue current medications    Medication Management   Pt uses [facility] for all medications Uses pill box? Yes Pt endorses great compliance  We discussed: Encouraged patient to consider pharmacy delivery. Patient declines at this time.   Plan  Continue current medication management strategy    Follow up: 1 month phone visit

## 2020-09-11 ENCOUNTER — Other Ambulatory Visit: Payer: Self-pay | Admitting: Family Medicine

## 2020-09-12 ENCOUNTER — Telehealth: Payer: Medicare HMO

## 2020-09-18 ENCOUNTER — Other Ambulatory Visit: Payer: Self-pay | Admitting: Family Medicine

## 2020-09-18 DIAGNOSIS — E1165 Type 2 diabetes mellitus with hyperglycemia: Secondary | ICD-10-CM

## 2020-09-18 MED ORDER — NOVOLIN R FLEXPEN 100 UNIT/ML IJ SOPN
PEN_INJECTOR | INTRAMUSCULAR | 1 refills | Status: DC
Start: 2020-09-18 — End: 2021-01-02

## 2020-09-24 DIAGNOSIS — J9610 Chronic respiratory failure, unspecified whether with hypoxia or hypercapnia: Secondary | ICD-10-CM | POA: Diagnosis not present

## 2020-09-25 ENCOUNTER — Other Ambulatory Visit: Payer: Self-pay | Admitting: Family Medicine

## 2020-09-26 DIAGNOSIS — J9610 Chronic respiratory failure, unspecified whether with hypoxia or hypercapnia: Secondary | ICD-10-CM | POA: Diagnosis not present

## 2020-09-28 ENCOUNTER — Telehealth: Payer: Self-pay

## 2020-10-01 ENCOUNTER — Other Ambulatory Visit: Payer: Self-pay

## 2020-10-01 DIAGNOSIS — E1140 Type 2 diabetes mellitus with diabetic neuropathy, unspecified: Secondary | ICD-10-CM

## 2020-10-01 DIAGNOSIS — Z794 Long term (current) use of insulin: Secondary | ICD-10-CM

## 2020-10-01 DIAGNOSIS — E1142 Type 2 diabetes mellitus with diabetic polyneuropathy: Secondary | ICD-10-CM

## 2020-10-01 MED ORDER — LEVEMIR FLEXTOUCH 100 UNIT/ML ~~LOC~~ SOPN: 30.0000 [IU] | PEN_INJECTOR | Freq: Every day | SUBCUTANEOUS | Status: DC

## 2020-10-03 ENCOUNTER — Other Ambulatory Visit: Payer: Self-pay | Admitting: Family Medicine

## 2020-10-04 NOTE — Progress Notes (Signed)
Cancelled due to lack of transportation

## 2020-10-05 ENCOUNTER — Ambulatory Visit (INDEPENDENT_AMBULATORY_CARE_PROVIDER_SITE_OTHER): Payer: Medicare HMO

## 2020-10-05 DIAGNOSIS — G894 Chronic pain syndrome: Secondary | ICD-10-CM

## 2020-10-05 DIAGNOSIS — F1120 Opioid dependence, uncomplicated: Secondary | ICD-10-CM

## 2020-10-05 DIAGNOSIS — E1142 Type 2 diabetes mellitus with diabetic polyneuropathy: Secondary | ICD-10-CM

## 2020-10-05 NOTE — Chronic Care Management (AMB) (Signed)
Patient's G[employer] is prior authorization for 2022. Pharmacist reviewed patient medication history and completed documentation for Cover My Meds prior authorization for [employer].   Key: [id number]  Sherre Poot, PharmD, [facility] Clinical Pharmacist [facility] [phone] (office) [phone] (mobile)

## 2020-10-08 ENCOUNTER — Telehealth: Payer: Self-pay

## 2020-10-08 ENCOUNTER — Ambulatory Visit (INDEPENDENT_AMBULATORY_CARE_PROVIDER_SITE_OTHER): Payer: Medicare HMO | Admitting: Family Medicine

## 2020-10-08 ENCOUNTER — Ambulatory Visit: Payer: Medicare Other | Admitting: Podiatry

## 2020-10-08 DIAGNOSIS — K219 Gastro-esophageal reflux disease without esophagitis: Secondary | ICD-10-CM

## 2020-10-08 DIAGNOSIS — E1165 Type 2 diabetes mellitus with hyperglycemia: Secondary | ICD-10-CM

## 2020-10-08 DIAGNOSIS — E782 Mixed hyperlipidemia: Secondary | ICD-10-CM

## 2020-10-08 DIAGNOSIS — I110 Hypertensive heart disease with heart failure: Secondary | ICD-10-CM

## 2020-10-08 NOTE — Telephone Encounter (Signed)
PA approved from 09/01/2020 through 08-31-2021 per [facility] part D coverage.

## 2020-10-09 NOTE — Progress Notes (Signed)
Chronic Care Management Pharmacy Assistant   Name: Jon [name]  MRN: [id number] DOB: 1951-07-08   PCP : Rochel Brome, MD  Allergies:   Allergies  Allergen Reactions  . Hydrocodone Itching  . Morphine Itching  . Duloxetine Hcl Other (See Comments)    Unknown reaction  . Codeine Itching  . Penicillins Itching and Rash    DID THE REACTION INVOLVE: Swelling of the face/tongue/throat, SOB, or low BP? No Sudden or severe rash/hives, skin peeling, or the inside of the mouth or nose? No Did it require medical treatment? No When did it last happen?unknown If all above answers are "NO", may proceed with cephalosporin use.     Medications: Outpatient Encounter Medications as of 09/28/2020  Medication Sig  . albuterol (PROVENTIL) (2.5 MG/3ML) 0.083% nebulizer solution Take 3 mLs (2.5 mg total) by nebulization 2 (two) times daily. And as needed (Patient taking differently: Take 2.5 mg by nebulization 2 (two) times daily as needed for wheezing. )  . atorvastatin (LIPITOR) 40 MG tablet TAKE 1 TABLET BY MOUTH ONCE DAILY  . Azelastine HCl 0.15 % SOLN USE 1 SPRAY IN EACH NOSTRIL TWICE DAILY  . BD PEN NEEDLE NANO 2ND GEN 32G X 4 MM MISC USE DAILY WITH BASAGLAR  . clopidogrel (PLAVIX) 75 MG tablet TAKE 1 TABLET BY MOUTH DAILY  . Continuous Blood Gluc Receiver (DEXCOM G6 RECEIVER) DEVI 1 each by Does not apply route 4 (four) times daily -  before meals and at bedtime.  . Continuous Blood Gluc Sensor (DEXCOM G6 SENSOR) MISC Apply every 10 days  . docusate sodium (COLACE) 100 MG capsule Take 100 mg by mouth 2 (two) times daily.  . famotidine (PEPCID) 20 MG tablet TAKE 2 TABLETS BY MOUTH TWICE DAILY  . ferrous sulfate 325 (65 FE) MG tablet Take 325 mg by mouth daily.  . Glucagon, rDNA, (GLUCAGON EMERGENCY) 1 MG KIT Inject 1 mg as directed as needed (low blood sugar).   . hydrALAZINE (APRESOLINE) 100 MG tablet TAKE ONE (1) TABLET BY MOUTH 3 TIMES DAILY. DISCONTINE DILTIAZEM.  Marland Kitchen Insulin  Regular Human (NOVOLIN R FLEXPEN) 100 UNIT/ML SOPN PER SLIDING SCALE ADMINSTER AS FOLLOWS: >150-200 2 U,201-250 4 U,251-300 8 U, 301-350 12 U,351-400 16 U,>400 20 U before each meal.  . isosorbide mononitrate (IMDUR) 120 MG 24 hr tablet TAKE 1 TABLET BY MOUTH ONCE DAILY IN THEMORNINGS  . lactulose (CHRONULAC) 10 GM/15ML solution TAKE 30MLS BY MOUTH IN THE MORNING AND AT BEDTIME  . levocetirizine (XYZAL) 5 MG tablet TAKE 1 TABLET BY MOUTH EVERY EVENING  . metolazone (ZAROXOLYN) 2.5 MG tablet TAKE 1 TABLET BY MOUTH DAILY 3 HOURS PRIOR TO LASIX DAILY  . mometasone-formoterol (DULERA) 200-5 MCG/ACT AERO Inhale 2 puffs into the lungs 2 (two) times daily as needed for wheezing.  Marland Kitchen MOVANTIK 25 MG TABS tablet   . nitroGLYCERIN (NITROSTAT) 0.4 MG SL tablet Place 0.4 mg under the tongue every 5 (five) minutes as needed for chest pain.   Marland Kitchen oxyCODONE-acetaminophen (PERCOCET/ROXICET) 5-325 MG tablet TAKE 1 TABLET BY MOUTH EVERY 4 HOURS AS NEEDED FOR SEVERE PAIN  . OXYGEN Inhale into the lungs. 2 liters at bedtime  . OZEMPIC, 1 MG/DOSE, 4 MG/3ML SOPN INJECT 1MG INTO THE SKIN ONCE A WEEK  . pantoprazole (PROTONIX) 40 MG tablet Take 40 mg by mouth in the morning.   . Potassium Chloride ER 20 MEQ TBCR Take 40 mEq by mouth 2 (two) times daily.   . pregabalin (LYRICA) 75 MG  capsule Take 1 capsule (75 mg total) by mouth 2 (two) times daily.  Marland Kitchen RELISTOR 150 MG TABS TAKE 3 TABLETS BY MOUTH EVERY MORNING  . sertraline (ZOLOFT) 50 MG tablet TAKE 1 TABLET BY MOUTH DAILY  . tamsulosin (FLOMAX) 0.4 MG CAPS capsule Take 2 capsules (0.8 mg total) by mouth daily after supper.  . torsemide (DEMADEX) 20 MG tablet TAKE 3 TABLETS BY MOUTH AT LUNCH AND TAKE 3 TABLETS AT SUPPER  . Vitamin D, Ergocalciferol, (DRISDOL) 1.25 MG (50000 UNIT) CAPS capsule TAKE 1 CAPSULE BY MOUTH DAILY  . XARELTO 15 MG TABS tablet TAKE 1 TABLET BY MOUTH ONCE DAILY (Patient taking differently: Take 15 mg by mouth at bedtime. )  . XTAMPZA ER 27 MG C12A  TAKE 1 CAPSULE BY MOUTH TWICE DAILY  . [DISCONTINUED] insulin detemir (LEVEMIR FLEXTOUCH) 100 UNIT/ML FlexPen Inject 30 Units into the skin daily.  . [DISCONTINUED] tamsulosin (FLOMAX) 0.4 MG CAPS capsule Take 0.4 mg by mouth daily.   No facility-administered encounter medications on file as of 09/28/2020.    Current Diagnosis: Patient Active Problem List   Diagnosis Date Noted  . Peripheral vascular disease ([place])   . Pericardial effusion   . On home oxygen therapy   . Obesity (BMI 30-39.9)   . Neuromuscular disorder ([place])   . Myocardial infarction ([place])   . Hypertension   . Hyperlipemia   . History of stroke   . H/O hiatal hernia   . Fibromyalgia   . Dysrhythmia   . CHF (congestive heart failure) ([place])   . Atrial fibrillation ([place])   . Asthma   . Arthritis   . Cholera screening 07/05/2020  . Acute sore throat 06/08/2020  . Cough 06/08/2020  . Diabetic ulcer of toe of right foot associated with type 2 diabetes mellitus, with necrosis of bone ([place])   . Fall 03/07/2020  . Closed fracture of multiple carpal bones 03/07/2020  . Chronic respiratory failure with hypoxia ([place]) 02/11/2020  . Morbid obesity with BMI of 40.0-44.9, adult ([place]) 02/11/2020  . Moderate recurrent major depression ([place]) 02/11/2020  . Class 3 severe obesity in adult [facility]) 12/30/2019  . Uncomplicated opioid dependence ([place]) 12/17/2019  . Anemia of chronic disease 12/17/2019  . Ulcers of both lower legs ([place]) 12/17/2019  . Acquired thrombophilia ([place]) 12/17/2019  . Acute venous embolism and thrombosis of deep vessels of distal lower extremity ([place]) 11/13/2019  . Vitamin D insufficiency 10/11/2019  . PVD (peripheral vascular disease) ([place]) 09/28/2018  . DM (diabetes mellitus), type 2 with renal complications ([place]) 29/92/4268  . Drug induced constipation 11/10/2017  . Chronic pain of both knees 03/18/2017  . Primary osteoarthritis of both knees 03/18/2017  . Preoperative cardiovascular examination 02/24/2016   . Hemoptysis 08/01/2015  . Herpes zoster 06/26/2015  . PNA (pneumonia) 04/10/2015  . Chronic respiratory failure ([place]) 04/10/2015  . Chronic diastolic heart failure ([place]) 02/22/2015  . LBBB (left bundle branch block) 02/22/2015  . Peritoneal effusion, chronic 02/22/2015  . Mild CAD 02/20/2015  . Precordial pain 01/26/2015  . OSA on CPAP 01/25/2015  . Back pain 01/25/2015  . COPD (chronic obstructive pulmonary disease) ([place]) 01/25/2015  . Abnormal EKG 11/29/2014  . Normal coronary arteries 2011 11/29/2014  . Troponin level elevated 11/29/2014  . Hyperkalemia-repeat pending 11/29/2014  . Acute renal insufficiency 11/29/2014  . Long term (current) use of anticoagulants 03/21/2014  . CVA (cerebral vascular accident) ([place]) 02/09/2014  . Chronic anticoagulation-Xarelto 02/09/2014  . BOOP (bronchiolitis obliterans with organizing pneumonia) ([place])  01/20/2014  . Chronic atrial fibrillation ([place]) 01/03/2014  . Pneumonia 12/2013  . Diabetic polyneuropathy associated with type 2 diabetes mellitus ([place]) 12/21/2013  . Uncontrolled type 2 diabetes mellitus with diabetic polyneuropathy, with long-term current use of insulin ([place]) 12/21/2013  . Chronic bronchitis ([place]) 12/21/2013  . Hypothyroidism   . Hypertensive heart disease with heart failure ([place])   . Restless leg syndrome   . GERD (gastroesophageal reflux disease)   . Sleep apnea   . Hyperlipidemia   . Neuropathy   . 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 Stroke [facility]) 2012   Unable to reach patient   Follow-Up:  Pharmacist Review  Donette Larry, [place], [place] Pharmacist Assistant [phone]

## 2020-10-09 NOTE — Chronic Care Management (AMB) (Signed)
Approved through 08/31/2021 per Cover My [place], PharmD, [facility] Clinical Pharmacist [facility] [phone] (office) [phone] (mobile)

## 2020-10-10 ENCOUNTER — Other Ambulatory Visit: Payer: Self-pay | Admitting: Family Medicine

## 2020-10-11 ENCOUNTER — Other Ambulatory Visit: Payer: Self-pay | Admitting: Legal Medicine

## 2020-10-11 DIAGNOSIS — E1142 Type 2 diabetes mellitus with diabetic polyneuropathy: Secondary | ICD-10-CM

## 2020-10-14 ENCOUNTER — Encounter: Payer: Self-pay | Admitting: Family Medicine

## 2020-10-17 ENCOUNTER — Other Ambulatory Visit: Payer: Self-pay | Admitting: Family Medicine

## 2020-10-18 ENCOUNTER — Other Ambulatory Visit: Payer: Self-pay

## 2020-10-18 ENCOUNTER — Ambulatory Visit: Payer: Medicare HMO

## 2020-10-18 DIAGNOSIS — I110 Hypertensive heart disease with heart failure: Secondary | ICD-10-CM | POA: Diagnosis not present

## 2020-10-18 DIAGNOSIS — E1142 Type 2 diabetes mellitus with diabetic polyneuropathy: Secondary | ICD-10-CM | POA: Diagnosis not present

## 2020-10-18 DIAGNOSIS — E782 Mixed hyperlipidemia: Secondary | ICD-10-CM | POA: Diagnosis not present

## 2020-10-18 DIAGNOSIS — E1165 Type 2 diabetes mellitus with hyperglycemia: Secondary | ICD-10-CM | POA: Diagnosis not present

## 2020-10-18 NOTE — Progress Notes (Signed)
Chronic Care Management Pharmacy Note  10/18/2020 Name:  Jon [name] MRN:  [id number] DOB:  1950/11/24   Plan Recommendations:   I have encouraged Jon [name] to schedule visit with Dr. Tobie Poet ASAP. He denies ability to get to visits at this time. Hopes to have his vehicle running next week and promises to make an appointment. Patient is using oxygen during the day due to "not feeling well". Denies shortness of breath.   He has been unable to com in office to get Dexcom to work correctly. Provided him with the Dexcom Cares number to work with a specialist. Denies low blood sugar recently.   Jon [name] has not seen nephrology or urology yet due to transporation.    Subjective: Jon [name] is an 70 y.o. year old male who is a primary patient of Cox, Kirsten, MD.  The CCM team was consulted for assistance with disease management and care coordination needs.    Engaged with patient by telephone for follow up visit in response to provider referral for pharmacy case management and/or care coordination services.   Consent to Services:  The patient was given information about Chronic Care Management services, agreed to services, and gave verbal consent prior to initiation of services.  Please see initial visit note for detailed documentation.   Patient Care Team: Rochel Brome, MD as PCP - General (Family Medicine) Sharon Mt, MD (Inactive) Debara Pickett, Nadean Corwin, MD as Consulting Physician (Cardiology) Elsie Stain, MD as Consulting Physician (Pulmonary Disease) Corliss Parish, MD as Consulting Physician (Nephrology) Burnice Logan, [facility] as Pharmacist (Pharmacist) Evelina Bucy, DPM as Consulting Physician (Podiatry)  Recent office visits: 10/08/2020 - cancelled appointment due to lack of transportation.  08/28/2020 - referral to nephrology and urology. Tamsulosin.  Recent consult visits: Hasn't seen specialist due to lack of transportation.   Hospital visits:   Objective:  Lab  Results  Component Value Date   CREATININE 2.14 (H) 08/28/2020   BUN 25 08/28/2020   GFRNONAA 30 (L) 08/28/2020   GFRAA 35 (L) 08/28/2020   NA 139 08/28/2020   K 3.2 (L) 08/28/2020   CALCIUM 8.1 (L) 08/28/2020   CO2 25 08/28/2020    Lab Results  Component Value Date/Time   HGBA1C 9.2 (H) 07/05/2020 09:19 AM   HGBA1C 8.8 (H) 03/27/2020 02:40 PM    Last diabetic Eye exam:  Lab Results  Component Value Date/Time   HMDIABEYEEXA No Retinopathy 07/11/2020 10:12 AM    Last diabetic Foot exam: No results found for: HMDIABFOOTEX   Lab Results  Component Value Date   CHOL 82 (L) 07/05/2020   HDL 39 (L) 07/05/2020   LDLCALC 32 07/05/2020   TRIG 39 07/05/2020   CHOLHDL 2.1 07/05/2020    Hepatic Function Latest Ref Rng & Units 08/28/2020 07/05/2020 03/27/2020  Total Protein 6.0 - 8.5 g/dL 6.0 6.5 6.8  Albumin 3.7 - 4.7 g/dL 3.5(L) 3.7 3.6(L)  AST 0 - 40 IU/L '16 21 11  ' ALT 0 - 44 IU/L '10 13 9  ' Alk Phosphatase 44 - 121 IU/L 115 144(H) 142(H)  Total Bilirubin 0.0 - 1.2 mg/dL 0.5 0.6 0.7    Lab Results  Component Value Date/Time   TSH 1.560 03/27/2020 02:40 PM   TSH 5.170 (H) 12/13/2019 07:51 AM   FREET4 1.33 02/01/2014 02:15 AM    CBC Latest Ref Rng & Units 07/05/2020 05/01/2020 03/27/2020  WBC 3.4 - 10.8 x10E3/uL 7.1 11.9(H) 12.2(H)  Hemoglobin 13.0 - 17.7 g/dL 14.6 13.4 13.7  Hematocrit 37.5 - 51.0 % 43.3 40.5 41.3  Platelets 150 - 450 x10E3/uL 316 309 357    Lab Results  Component Value Date/Time   VD25OH 61.8 10/11/2019 03:45 PM    Clinical ASCVD: Yes  The ASCVD Risk score Mikey Bussing DC Jr., et al., 2013) failed to calculate for the following reasons:   The patient has a prior MI or stroke diagnosis    Depression screen [facility] 2/9 08/28/2020 08/16/2020 08/02/2020  Decreased Interest 0 0 2  Down, Depressed, Hopeless 0 0 2  PHQ - 2 Score 0 0 4  Altered sleeping - - 0  Tired, decreased energy - - 3  Change in appetite - - 1  Feeling bad or failure about yourself  - - 0   Trouble concentrating - - 0  Moving slowly or fidgety/restless - - 0  Suicidal thoughts - - 0  PHQ-9 Score - - 8  Difficult doing work/chores - - -  Some recent data might be hidden      Social History   Tobacco Use  Smoking Status Former Smoker  . Types: Cigars  . Quit date: 09/01/2006  . Years since quitting: 14.1  Smokeless Tobacco Never Used   BP Readings from Last 3 Encounters:  08/28/20 130/68  08/16/20 124/68  08/07/20 128/81   Pulse Readings from Last 3 Encounters:  08/28/20 88  08/16/20 72  08/07/20 78   Wt Readings from Last 3 Encounters:  08/28/20 253 lb (114.8 kg)  08/16/20 251 lb (113.9 kg)  08/07/20 246 lb 3.2 oz (111.7 kg)    Assessment/Interventions: Review of patient past medical history, allergies, medications, health status, including review of consultants reports, laboratory and other test data, was performed as part of comprehensive evaluation and provision of chronic care management services.   SDOH:  (Social Determinants of Health) assessments and interventions performed: Yes   CCM Care Plan  Allergies  Allergen Reactions  . Hydrocodone Itching  . Morphine Itching  . Duloxetine Hcl Other (See Comments)    Unknown reaction  . Codeine Itching  . Penicillins Itching and Rash    DID THE REACTION INVOLVE: Swelling of the face/tongue/throat, SOB, or low BP? No Sudden or severe rash/hives, skin peeling, or the inside of the mouth or nose? No Did it require medical treatment? No When did it last happen?unknown If all above answers are "NO", may proceed with cephalosporin use.     Medications Reviewed Today    Reviewed by Rochel Brome, MD (Physician) on 10/14/20 at [place] List Status: <None>  Medication Order Taking? Sig Documenting Provider Last Dose Status Informant  albuterol (PROVENTIL) (2.5 MG/3ML) 0.083% nebulizer solution [id number] No Take 3 mLs (2.5 mg total) by nebulization 2 (two) times daily. And as needed  Patient taking  differently: Take 2.5 mg by nebulization 2 (two) times daily as needed for wheezing.    Parrett, Fonnie Mu, NP Taking Active Self  atorvastatin (LIPITOR) 40 MG tablet [id number] No TAKE 1 TABLET BY MOUTH ONCE DAILY Cox, Kirsten, MD Taking Active   Azelastine HCl 0.15 % SOLN [id number]  USE 1 SPRAY IN EACH NOSTRIL TWICE DAILY Cox, Kirsten, MD  Active   BD PEN NEEDLE NANO 2ND GEN 32G X 4 MM MISC [id number] No USE DAILY WITH Esmond Harps, MD Taking Active   clopidogrel (PLAVIX) 75 MG tablet [id number] No TAKE 1 TABLET BY MOUTH DAILY Cox, Kirsten, MD Taking Active   Continuous Blood Gluc Receiver (DEXCOM G6 RECEIVER) DEVI [id number] No  1 each by Does not apply route 4 (four) times daily -  before meals and at bedtime. Cox, Kirsten, MD Taking Active   Continuous Blood Gluc Sensor (DEXCOM G6 SENSOR) [place] [id number] No Apply every 10 days Cox, Elnita Maxwell, MD Taking Active   docusate sodium (COLACE) 100 MG capsule [id number] No Take 100 mg by mouth 2 (two) times daily. [provider] Taking Active Self  famotidine (PEPCID) 20 MG tablet [id number] No TAKE 2 TABLETS BY MOUTH TWICE DAILY Cox, Kirsten, MD Taking Active   ferrous sulfate 325 (65 FE) MG tablet [id number] No Take 325 mg by mouth daily. [provider] Taking Active Self  Glucagon, rDNA, (GLUCAGON EMERGENCY) 1 MG KIT [id number] No Inject 1 mg as directed as needed (low blood sugar).  [provider] Taking Active Self  hydrALAZINE (APRESOLINE) 100 MG tablet [id number]  TAKE ONE (1) TABLET BY MOUTH 3 TIMES DAILY. DISCONTINE DILTIAZEM. Lillard Anes, MD  Active   insulin detemir [facility]) 100 UNIT/ML FlexPen [id number]  Inject 30 Units into the skin daily. Cox, Kirsten, MD  Active   Insulin Regular Human (NOVOLIN R FLEXPEN) 100 UNIT/ML SOPN [id number]  PER SLIDING SCALE ADMINSTER AS FOLLOWS: >150-200 2 U,201-250 4 U,251-300 8 U, 301-350 12 U,351-400 16 U,>400 20 U before each meal. Cox, Kirsten, MD  Active    isosorbide mononitrate (IMDUR) 120 MG 24 hr tablet [id number] No TAKE 1 TABLET BY MOUTH ONCE DAILY IN Marvell Fuller, Clarise Cruz, PA-C Taking Active   lactulose (CHRONULAC) 10 GM/15ML solution [id number] No TAKE 30MLS BY MOUTH IN THE MORNING AND AT BEDTIME Cox, Elnita Maxwell, MD Taking Active   levocetirizine (XYZAL) 5 MG tablet [id number]  TAKE 1 TABLET BY MOUTH EVERY Julio Alm, Kirsten, MD  Active   metolazone (ZAROXOLYN) 2.5 MG tablet [id number]  TAKE 1 TABLET BY MOUTH DAILY 3 HOURS PRIOR TO LASIX DAILY Lillard Anes, MD  Active   metoprolol succinate (TOPROL-XL) 50 MG 24 hr tablet [id number]  TAKE 1 TABLET BY MOUTH ONCE DAILY Cox, Kirsten, MD  Active   mometasone-formoterol [facility]) 200-5 MCG/ACT AERO [id number] No Inhale 2 puffs into the lungs 2 (two) times daily as needed for wheezing. [provider] Taking Active Self  MOVANTIK 25 MG TABS tablet [id number]   [provider]  Active   nitroGLYCERIN (NITROSTAT) 0.4 MG SL tablet [id number] No Place 0.4 mg under the tongue every 5 (five) minutes as needed for chest pain.  [provider] Taking Active Self           Med Note Stevan Born   Tue Apr 07, 2017  3:39 PM)    oxyCODONE-acetaminophen (PERCOCET/ROXICET) 5-325 MG tablet [id number] No TAKE 1 TABLET BY MOUTH EVERY 4 HOURS AS NEEDED FOR SEVERE PAIN Evelina Bucy, DPM Taking Active   OXYGEN [id number] No Inhale into the lungs. 2 liters at bedtime [provider] Taking Active Self  OZEMPIC, 1 MG/DOSE, 4 MG/3ML SOPN [id number]  INJECT 1MG INTO THE SKIN ONCE A WEEK Cox, Kirsten, MD  Active   pantoprazole (PROTONIX) 40 MG tablet [id number] No Take 40 mg by mouth in the morning.  [provider] Taking Active Self           Med Note Stevan Born   Tue Apr 07, 2017  3:30 PM)    Potassium Chloride ER 20 MEQ TBCR [id number] No Take 40 mEq by mouth 2 (two) times daily.  [provider] Taking Active Self  potassium chloride SA (KLOR-CON)  20 MEQ  tablet [id number]  TAKE TWO TABLETS BY MOUTH TWICE A Luster Landsberg, MD  Active   pregabalin (LYRICA) 75 MG capsule [id number]  TAKE 1 CAPSULE BY MOUTH TWICE DAILY Lillard Anes, MD  Active   RELISTOR 150 MG TABS [id number] No TAKE 3 TABLETS BY MOUTH EVERY MORNING Cox, Kirsten, MD Taking Active   sertraline (ZOLOFT) 50 MG tablet [id number]  TAKE 1 TABLET BY MOUTH DAILY Cox, Kirsten, MD  Active   tamsulosin (FLOMAX) 0.4 MG CAPS capsule [id number]  Take 2 capsules (0.8 mg total) by mouth daily after supper. Cox, Kirsten, MD  Active   torsemide (DEMADEX) 20 MG tablet [id number] No TAKE 3 TABLETS BY MOUTH AT LUNCH AND TAKE 3 TABLETS AT SUPPER Cox, Kirsten, MD Taking Active   Vitamin D, Ergocalciferol, (DRISDOL) 1.25 MG (50000 UNIT) CAPS capsule [id number] No TAKE 1 CAPSULE BY MOUTH DAILY Cox, Kirsten, MD Taking Active Self  XARELTO 15 MG TABS tablet [id number] No TAKE 1 TABLET BY MOUTH ONCE DAILY  Patient taking differently: Take 15 mg by mouth at bedtime.    Lillard Anes, MD Taking Active Self  XTAMPZA ER 27 MG C12A [id number]  TAKE 1 CAPSULE BY MOUTH TWICE DAILY Cox, Kirsten, MD  Active           Patient Active Problem List   Diagnosis Date Noted  . Peripheral vascular disease ([place])   . Pericardial effusion   . On home oxygen therapy   . Obesity (BMI 30-39.9)   . Neuromuscular disorder ([place])   . Myocardial infarction ([place])   . Hypertension   . Hyperlipemia   . History of stroke   . H/O hiatal hernia   . Fibromyalgia   . Dysrhythmia   . CHF (congestive heart failure) ([place])   . Atrial fibrillation ([place])   . Asthma   . Arthritis   . Cholera screening 07/05/2020  . Acute sore throat 06/08/2020  . Cough 06/08/2020  . Diabetic ulcer of toe of right foot associated with type 2 diabetes mellitus, with necrosis of bone ([place])   . Fall 03/07/2020  . Closed fracture of multiple carpal bones 03/07/2020  . Chronic respiratory failure with hypoxia ([place]) 02/11/2020  . Morbid  obesity with BMI of 40.0-44.9, adult ([place]) 02/11/2020  . Moderate recurrent major depression ([place]) 02/11/2020  . Class 3 severe obesity in adult [facility]) 12/30/2019  . Uncomplicated opioid dependence ([place]) 12/17/2019  . Anemia of chronic disease 12/17/2019  . Ulcers of both lower legs ([place]) 12/17/2019  . Acquired thrombophilia ([place]) 12/17/2019  . Acute venous embolism and thrombosis of deep vessels of distal lower extremity ([place]) 11/13/2019  . Vitamin D insufficiency 10/11/2019  . PVD (peripheral vascular disease) ([place]) 09/28/2018  . DM (diabetes mellitus), type 2 with renal complications ([place]) 39/76/7341  . Drug induced constipation 11/10/2017  . Chronic pain of both knees 03/18/2017  . Primary osteoarthritis of both knees 03/18/2017  . Preoperative cardiovascular examination 02/24/2016  . Hemoptysis 08/01/2015  . Herpes zoster 06/26/2015  . PNA (pneumonia) 04/10/2015  . Chronic respiratory failure ([place]) 04/10/2015  . Chronic diastolic heart failure ([place]) 02/22/2015  . LBBB (left bundle branch block) 02/22/2015  . Peritoneal effusion, chronic 02/22/2015  . Mild CAD 02/20/2015  . Precordial pain 01/26/2015  . OSA on CPAP 01/25/2015  . Back pain 01/25/2015  . COPD (chronic obstructive pulmonary disease) ([place]) 01/25/2015  . Abnormal EKG 11/29/2014  . Normal coronary arteries 2011 11/29/2014  . Troponin level elevated 11/29/2014  .  Hyperkalemia-repeat pending 11/29/2014  . Acute renal insufficiency 11/29/2014  . Long term (current) use of anticoagulants 03/21/2014  . CVA (cerebral vascular accident) ([place]) 02/09/2014  . Chronic anticoagulation-Xarelto 02/09/2014  . BOOP (bronchiolitis obliterans with organizing pneumonia) ([place]) 01/20/2014  . Chronic atrial fibrillation ([place]) 01/03/2014  . Pneumonia 12/2013  . Diabetic polyneuropathy associated with type 2 diabetes mellitus ([place]) 12/21/2013  . Uncontrolled type 2 diabetes mellitus with diabetic polyneuropathy, with long-term current  use of insulin ([place]) 12/21/2013  . Chronic bronchitis ([place]) 12/21/2013  . Hypothyroidism   . Hypertensive heart disease with heart failure ([place])   . Restless leg syndrome   . GERD (gastroesophageal reflux disease)   . Sleep apnea   . Hyperlipidemia   . Neuropathy   . 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 Stroke [facility]) 2012    Immunization History  Administered Date(s) Administered  . Influenza Inj Mdck Quad Pf 05/02/2020  . Influenza Split 10/22/2013  . Influenza,inj,Quad PF,6+ Mos 06/11/2015, 06/01/2016  . PFIZER(Purple Top)SARS-COV-2 Vaccination 12/13/2019, 01/13/2020, 07/05/2020  . Pneumococcal Conjugate-13 06/03/2016  . Pneumococcal-Unspecified 11/30/2013  . Tdap 08/30/2012    Conditions to be addressed/monitored:  Hyperlipidemia, Diabetes, Heart Failure, Chronic Kidney Disease and Depression  Care Plan : CCM Pharmacy Care Plan  Updates made by Burnice Logan, RPH since 10/18/2020 12:00 AM    Problem: CAD, HTN, CHF, Diabetes   Priority: High  Onset Date: 10/18/2020    Long-Range Goal: Disease Management   Start Date: 10/18/2020  Expected End Date: 10/18/2021  This Visit's Progress: On track  Priority: High  Note:   Current Barriers:  . Unable to achieve control of diabetes  . Transportation unavailable currently for patient to attend visit.   Pharmacist Clinical Goal(s):  Marland Kitchen Over the next 90 days, patient will achieve adherence to monitoring guidelines and medication adherence to achieve therapeutic efficacy . achieve control of diabetes as evidenced by a1c and blood glucose through collaboration with PharmD and provider.   Interventions: . 1:1 collaboration with Rochel Brome, MD regarding development and update of comprehensive plan of care as evidenced by provider attestation and co-signature . Inter-disciplinary care team collaboration (see longitudinal plan of care) . Comprehensive medication review performed; medication list updated in electronic medical  record  Hyperlipidemia: (LDL goal < 55) -controlled -Current treatment: . Atorvastatin 40 mg daily  -Medications previously tried:  None reported  -Current dietary patterns: eats 2 meals daily. States he has no control over meal choices. Family prepares.  -Current exercise habits: no formal exercise.  -Educated on Cholesterol goals;  Benefits of statin for ASCVD risk reduction; -Recommended to continue current medication  Diabetes (A1c goal <7%) -uncontrolled -Current medications: . Dexcom . Glucagon emergency kit . Levemir 26 units daily  . Novolin R flex pen sliding scale before meals . Ozempic 1 mg weekly   -Medications previously tried: metformin, novolog, humalog  -Current home glucose readings . fasting glucose: 660630 . post prandial glucose: 105-292 -Denies hypoglycemic/hyperglycemic symptoms -Current meal patterns:  . Patient states that he eats 2 meals daily - doesn't have a say in what is prepared/served.  -Current exercise: none reported -Educated onA1c and blood sugar goals; Complications of diabetes including kidney damage, retinal damage, and cardiovascular disease; Benefits of weight loss; Benefits of routine self-monitoring of blood sugar; Continuous glucose monitoring; -Counseled to check feet daily and get yearly eye exams -Recommended to continue current medication Educated on Dexcom - offered to help patient in office but transportation limiting ability to come in office.  Provided patient with Dexcom Cares number for education/support.   Atrial Fibrillation (Goal: prevent stroke and major bleeding) -controlled  -CHADSVASC: CHA2DS2-VASc Score = 7   -Current treatment: . Rate control: metoprolol xl 50 mg daily  . Anticoagulation: xarelto 15 mg daily  -Medications previously tried: none reported -Home BP and HR readings: none provided today  -Counseled on increased risk of stroke due to Afib and benefits of anticoagulation for stroke  prevention; importance of adherence to anticoagulant exactly as prescribed; -Recommended to continue current medication  Heart Failure (Goal: control symptoms and prevent exacerbations) uncontrolled Type: Hypertensive Heart disease with heart failure -Ejection fraction: 55-60% (Date: 09/07/2018) -Current treatment:  metoprolol xl 50 mg daily   Isosorbide mn 120 gm daily am   Hydralazone 100 mg tid  Metolazone 2.5 mg tid   Torsemide 20 mg 3 tablets at lunch and supper -Medications previously tried: furosemide, indapamide -Current home BP/HR readings: none reported -Current dietary habits: eats 2 meals daily. Can't control food options since he doesn't prepare his own meals.  -Current exercise routine: none reported -Educated on Benefits of medications for managing symptoms and prolonging life Importance of blood pressure control -Counseled on diet and exercise extensively Recommended to continue current medication  Patient Goals/Self-Care Activities . Over the next 90 days, patient will:  - take medications as prescribed focus on medication adherence by using pill box check glucose four times daily, document, and provide at future appointments Schedule visit with Dr. Tobie Poet as soon as possible  Follow Up Plan: Telephone follow up appointment with care management team member scheduled for: 01/08/2021      Medication Assistance: None required.  Patient affirms current coverage meets needs.  Patient's preferred pharmacy is:  [facility], [address] [place] [address] Phone: [phone] Fax: [facility] (New Address) - [place], [place] AT Previously: [address], [address] [place] [address] Phone: [phone] Fax: [phone]  Uses pill box? Yes Pt endorses fair compliance  We discussed:  Benefits of medication synchronization, packaging and delivery as well as enhanced pharmacist oversight with [employer]. Patient decided to: Continue current medication management strategy  Care Plan and Follow Up Patient Decision:  Patient agrees to Care Plan and Follow-up.  Plan: Telephone follow up appointment with care management team member scheduled for:  01/08/2021  Pharmacy assistant will follow-up with patient each month. Patient will contact pharmacist if needed sooner as well.

## 2020-10-18 NOTE — Patient Instructions (Addendum)
Visit Information  Goals Addressed            This Visit's Progress   . Learn More About My Health       Timeframe:  Long-Range Goal Priority:  High Start Date:        10/18/2020                     Expected End Date:          10/18/2021              Follow Up Date 01/08/2021    - make a list of questions - ask questions - bring a list of my medicines to the visit - speak up when I don't understand    Why is this important?    The best way to learn about your health and care is by talking to the doctor and nurse.   They will answer your questions and give you information in the way that you like best.    Notes:     Marland Kitchen Manage My Medicine       Timeframe:  Long-Range Goal Priority:  High Start Date:             10/18/2020                Expected End Date:               10/18/2021        Follow Up Date 01/08/2021    - call for medicine refill 2 or 3 days before it runs out - keep a list of all the medicines I take; vitamins and herbals too - use a pillbox to sort medicine    Why is this important?   . These steps will help you keep on track with your medicines.   Notes:     . Track and Manage Symptoms-Heart Failure       Timeframe:  Long-Range Goal Priority:  High Start Date:            10/18/2020                 Expected End Date:       10/18/2021                Follow Up Date 01/08/2021    - eat more whole grains, fruits and vegetables, lean meats and healthy fats - know when to call the doctor - track symptoms and what helps feel better or worse    Why is this important?    You will be able to handle your symptoms better if you keep track of them.   Making some simple changes to your lifestyle will help.   Eating healthy is one thing you can do to take good care of yourself.    Notes:       Patient Care Plan: CCM Pharmacy Care Plan    Problem Identified: CAD, HTN, CHF, Diabetes   Priority: High  Onset Date: 10/18/2020    Long-Range Goal:  Disease Management   Start Date: 10/18/2020  Expected End Date: 10/18/2021  This Visit's Progress: On track  Priority: High  Note:   Current Barriers:  . Unable to achieve control of diabetes  . Transportation unavailable currently for patient to attend visit.   Pharmacist Clinical Goal(s):  Marland Kitchen Over the next 90 days, patient will achieve adherence to monitoring guidelines and medication adherence to achieve therapeutic efficacy . achieve  control of diabetes as evidenced by a1c and blood glucose through collaboration with PharmD and provider.   Interventions: . 1:1 collaboration with Rochel Brome, MD regarding development and update of comprehensive plan of care as evidenced by provider attestation and co-signature . Inter-disciplinary care team collaboration (see longitudinal plan of care) . Comprehensive medication review performed; medication list updated in electronic medical record  Hyperlipidemia: (LDL goal < 55) -controlled -Current treatment: . Atorvastatin 40 mg daily  -Medications previously tried:  None reported  -Current dietary patterns: eats 2 meals daily. States he has no control over meal choices. Family prepares.  -Current exercise habits: no formal exercise.  -Educated on Cholesterol goals;  Benefits of statin for ASCVD risk reduction; -Recommended to continue current medication  Diabetes (A1c goal <7%) -uncontrolled -Current medications: . Dexcom . Glucagon emergency kit . Levemir 26 units daily  . Novolin R flex pen sliding scale before meals . Ozempic 1 mg weekly   -Medications previously tried: metformin, novolog, humalog  -Current home glucose readings . fasting glucose: [id number] . post prandial glucose: 105-292 -Denies hypoglycemic/hyperglycemic symptoms -Current meal patterns:  . Patient states that he eats 2 meals daily - doesn't have a say in what is prepared/served.  -Current exercise: none reported -Educated onA1c and blood sugar  goals; Complications of diabetes including kidney damage, retinal damage, and cardiovascular disease; Benefits of weight loss; Benefits of routine self-monitoring of blood sugar; Continuous glucose monitoring; -Counseled to check feet daily and get yearly eye exams -Recommended to continue current medication Educated on Dexcom - offered to help patient in office but transportation limiting ability to come in office. Provided patient with Dexcom Cares number for education/support.   Atrial Fibrillation (Goal: prevent stroke and major bleeding) -controlled  -CHADSVASC: CHA2DS2-VASc Score = 7   -Current treatment: . Rate control: metoprolol xl 50 mg daily  . 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 Can't control food options since he doesn't prepare his own meals.  -Current exercise routine: none reported -Educated on Benefits of medications for managing symptoms and prolonging life Importance of blood pressure control -Counseled on diet and exercise extensively Recommended to continue current medication  Patient Goals/Self-Care Activities . Over the next 90 days, patient will:  - take  medications as prescribed focus on medication adherence by using pill box check glucose four times daily, document, and provide at future appointments Schedule visit with Dr. Tobie Poet as soon as possible  Follow Up Plan: Telephone follow up appointment with care management team member scheduled for: 01/08/2021      The patient verbalized understanding of instructions, educational materials, and care plan provided today and declined offer to receive copy of patient instructions, educational materials, and care plan.  Telephone follow up appointment with pharmacy team member scheduled for: 01/08/2021 with pharmacist  Burnice Logan, [facility]  Blood Glucose Monitoring, Adult Monitoring your blood sugar (glucose) is an important part of managing your diabetes. Blood glucose monitoring involves checking your blood glucose as often as directed and keeping a log or record of your results over time. Checking your blood glucose regularly and keeping a blood glucose log can:  Help you and your health care provider adjust your diabetes management plan as needed, including your medicines or insulin.  Help you understand how food, exercise, illnesses, and medicines affect your blood glucose.  Let you know what your blood glucose is at any time. You can quickly find out if you have low blood glucose (hypoglycemia) or high blood glucose (hyperglycemia). Your health care provider will set individualized treatment goals for you. Your goals will be based on your age, other medical conditions you have, and how you respond to diabetes treatment. Generally, the goal of treatment is to maintain the following blood glucose levels:  Before meals (preprandial): 80-130 mg/dL (4.4-7.2 mmol/L).  After meals (postprandial): below 180 mg/dL (10 mmol/L).  A1C level: less than 7%. Supplies needed:  Blood glucose meter.  Test strips for your meter. Each meter has its own strips. You must use the strips that came with your  meter.  A needle to prick your finger (lancet). Do not use a lancet more than one time.  A device that holds the lancet (lancing device).  A journal or log book to write down your results. How to check your blood glucose Checking your blood glucose 1. Wash your hands for at least 20 seconds with soap and water. 2. Prick the side of your finger (not the tip) with the lancet. Do not use the same finger consecutively. 3. Gently rub the finger until a small drop of blood appears. 4. Follow instructions that come with your meter for inserting the test strip, applying blood to the strip, and using your blood glucose meter. 5. Write down your result and any notes in your log.   Using alternative sites Some meters allow you to use areas of your body other than your finger (alternative sites) to test your blood. The most common alternative sites are the forearm, the thigh, and the palm of your hand. Alternative sites may not be as accurate as the fingers because blood flow is slower in those areas. This means that the result you get may be delayed, and it may be different from the result that you would get from your finger. Use the finger only, and do not use alternative sites, if:  You think you have hypoglycemia.  You sometimes do not know that your blood glucose is getting low (hypoglycemia unawareness). General tips and recommendations Blood glucose log  Every time you check your blood glucose, write down your result. Also write down any notes about things that may be affecting your blood glucose, such as your diet and exercise for the day. This information can help you and your health care provider: ? Look for patterns in your blood glucose over time. ? Adjust your diabetes management plan as needed.  Check if your meter allows you to download your records to a computer or if there is an app for the meter. Most glucose meters store a record of glucose readings in the meter.   If you have  type 1 diabetes:  Check your blood glucose 4 or more times a day if you are on intensive insulin therapy with multiple daily injections (MDI) or if you are  using an insulin pump. Check your blood glucose: ? Before every meal and snack. ? Before bedtime.  Also check your blood glucose: ? If you have symptoms of hypoglycemia. ? After treating low blood glucose. ? Before doing activities that create a risk for injury, like driving or using machinery. ? Before and after exercise. ? Two hours after a meal. ? Occasionally between 2:00 a.m. and 3:00 a.m., as directed.  You may need to check your blood glucose more often, 6-10 times per day, if: ? You have diabetes that is not well controlled. ? You are ill. ? You have a history of severe hypoglycemia. ? You have hypoglycemia unawareness. If you have type 2 diabetes:  Check your blood glucose 2 or more times a day if you take insulin or other diabetes medicines.  Check your blood glucose 4 or more times a day if you are on intensive insulin therapy. Occasionally, you may also need to check your glucose between 2:00 a.m. and 3:00 a.m., as directed.  Also check your blood glucose: ? Before and after exercise. ? Before doing activities that create a risk for injury, like driving or using machinery.  You may need to check your blood glucose more often if: ? Your medicine is being adjusted. ? Your diabetes is not well controlled. ? You are ill. General tips  Make sure you always have your supplies with you.  After you use a few boxes of test strips, adjust (calibrate) your blood glucose meter by following instructions that came with your meter.  If you have questions or need help, all blood glucose meters have a 24-hour hotline phone number available that you can call. Also contact your health care provider with questions or concerns you may have. Where to find more information  The American Diabetes Association: www.diabetes.org  The  Association of Diabetes Care & Education Specialists: www.diabeteseducator.org Contact a health care provider if:  Your blood glucose is at or above 240 mg/dL (13.3 mmol/L) for 2 days in a row.  You have been sick or have had a fever for 2 days or longer, and you are not getting better.  You have any of the following problems for more than 6 hours: ? You cannot eat or drink. ? You have nausea or vomiting. ? You have diarrhea. Get help right away if:  Your blood glucose is lower than 54 mg/dL (3 mmol/L).  You become confused, or you have trouble thinking clearly.  You have difficulty breathing.  You have moderate or large ketone levels in your urine. These symptoms may represent a serious problem that is an emergency. Do not wait to see if the symptoms will go away. Get medical help right away. Call your local emergency services (911 in the [place] Do not drive yourself to the hospital. Summary  Monitoring your blood glucose is an important part of managing your diabetes.  Blood glucose monitoring involves checking your blood glucose as often as directed and keeping a log or record of your results over time.  Your health care provider will set individualized treatment goals for you. Your goals will be based on your age, other medical conditions you have, and how you respond to diabetes treatment.  Every time you check your blood glucose, write down your result. Also, write down any notes about things that may be affecting your blood glucose, such as your diet and exercise for the day. This information is not intended to replace advice given to you by your  health care provider. Make sure you discuss any questions you have with your health care provider. Document Revised: 05/16/2020 Document Reviewed: 05/16/2020 Elsevier Patient Education  2021 [employer].

## 2020-10-19 ENCOUNTER — Other Ambulatory Visit: Payer: Self-pay

## 2020-10-19 MED ORDER — ALBUTEROL SULFATE (2.5 MG/3ML) 0.083% IN NEBU: 2.5000 mg | INHALATION_SOLUTION | Freq: Two times a day (BID) | RESPIRATORY_TRACT | 1 refills | Status: DC | PRN

## 2020-10-23 ENCOUNTER — Other Ambulatory Visit: Payer: Self-pay

## 2020-10-24 ENCOUNTER — Other Ambulatory Visit: Payer: Self-pay | Admitting: Family Medicine

## 2020-10-24 IMAGING — US US RENAL
2 series · 14 of 25 positions shown · non-contrast
Comparison: Ultrasound June 14, 2009.

CLINICAL DATA: Acute renal failure.

EXAM:
RENAL / URINARY TRACT ULTRASOUND COMPLETE

[Series 1: us renal · 13 of 38 slices shown (1 of 2)]
[im 1/38]
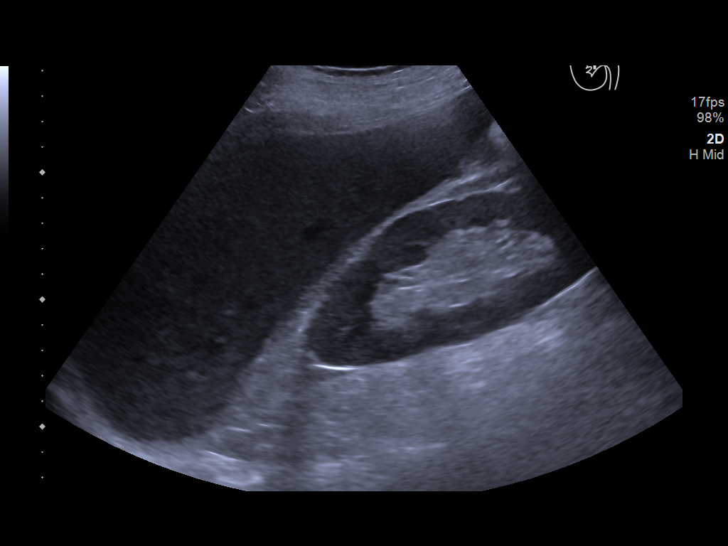
[im 4/38]
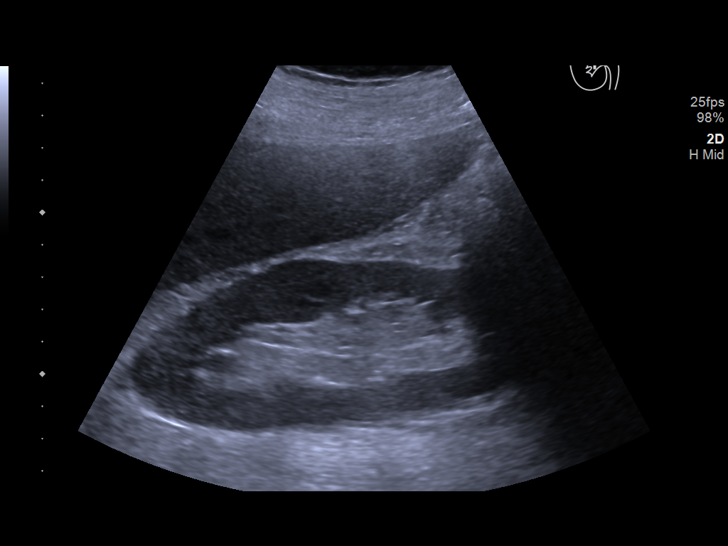
[im 7/38]
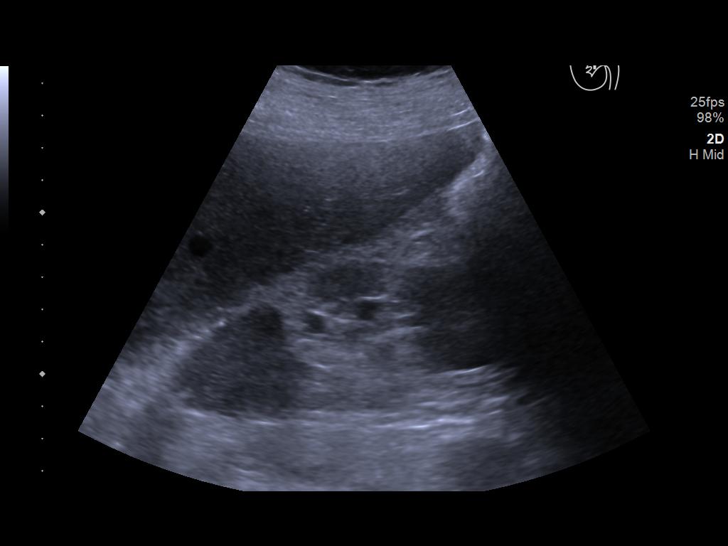
[im 10/38]
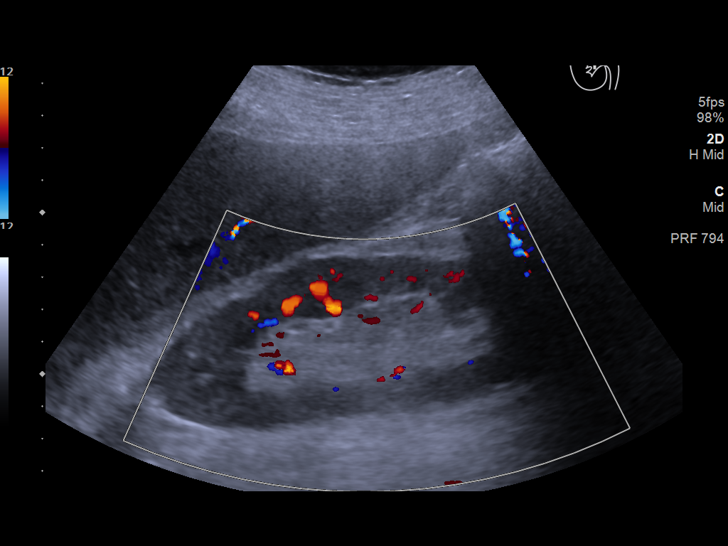
[im 13/38]
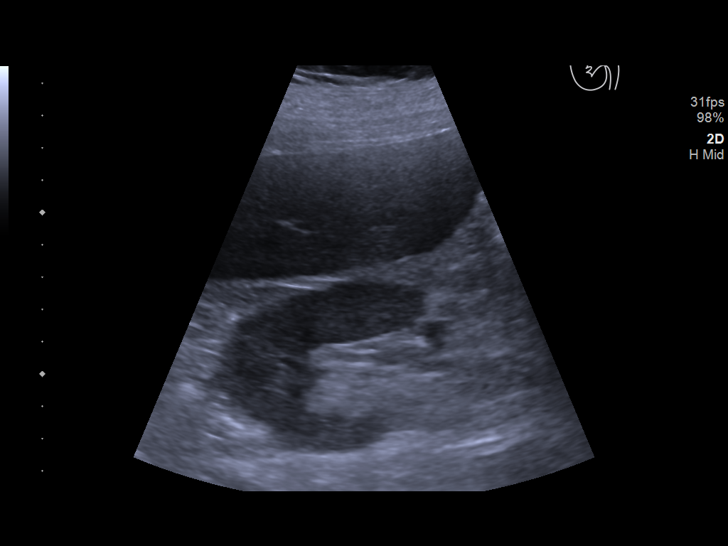
[im 15/38]
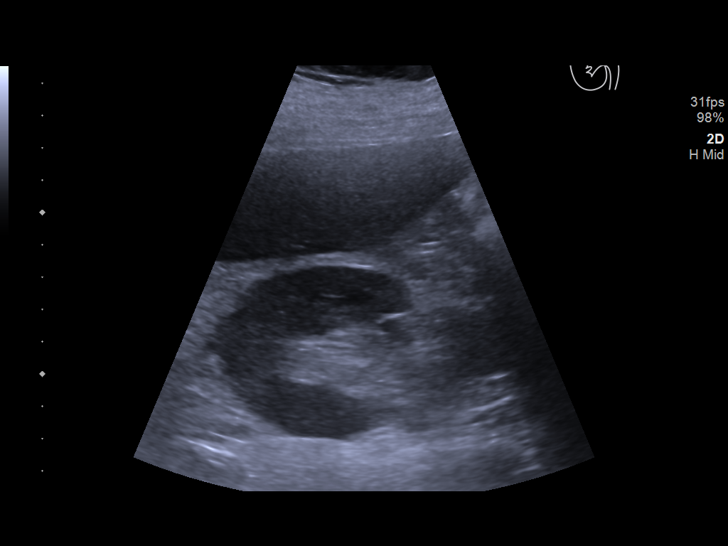
[im 18/38]
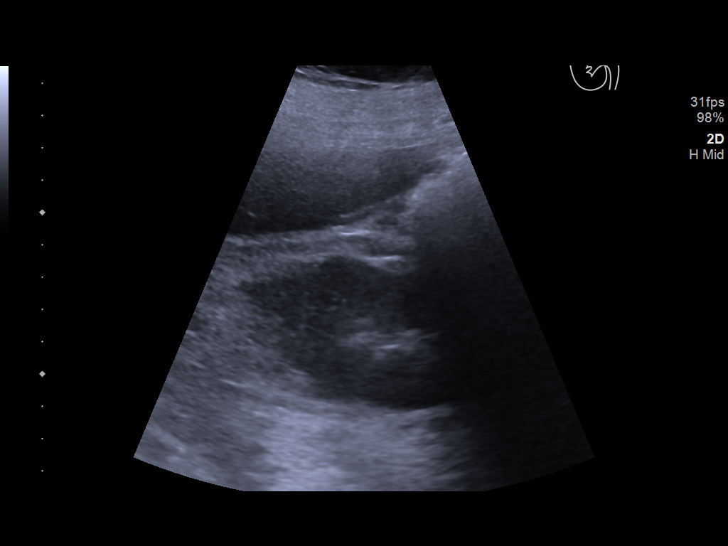
[im 21/38]
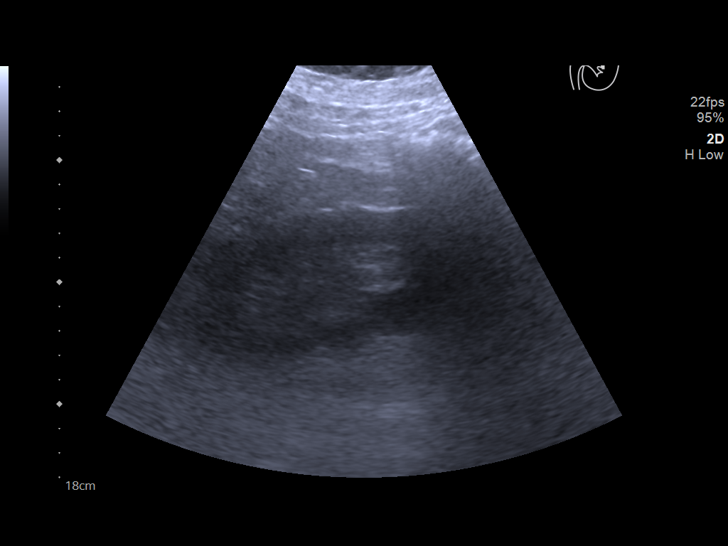
[im 25/38]
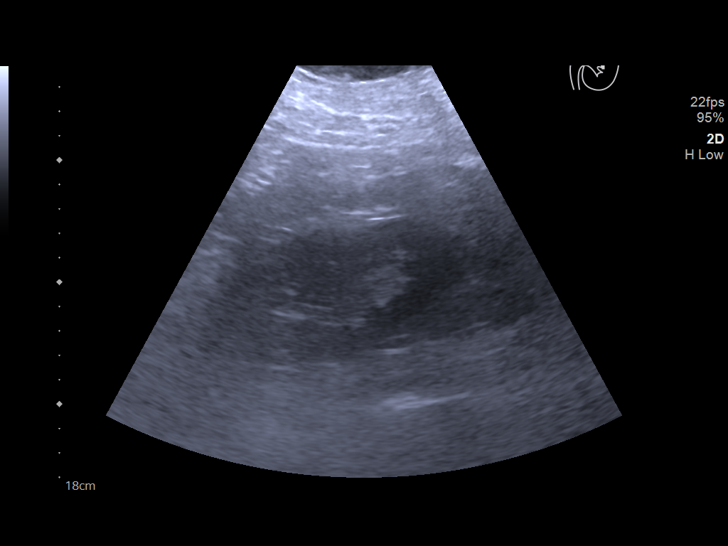
[im 26/38]
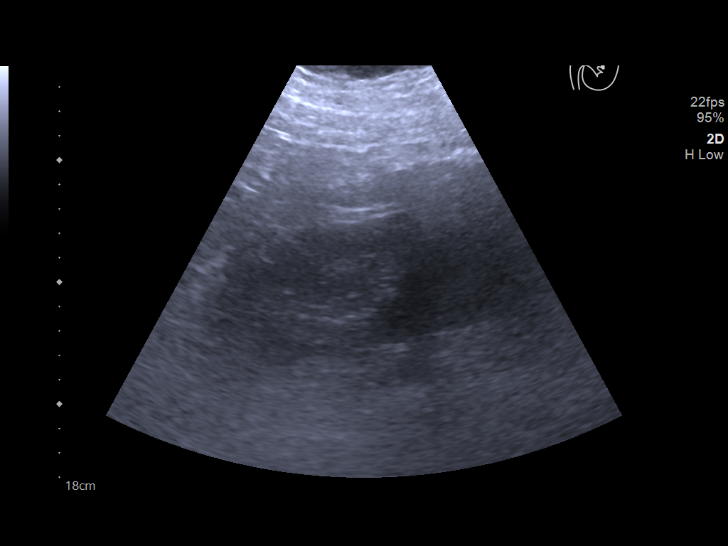
[im 29/38]
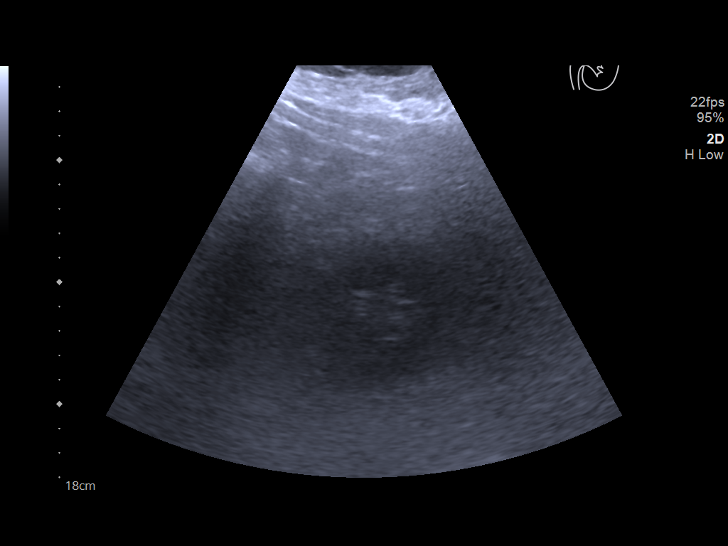
[im 33/38]
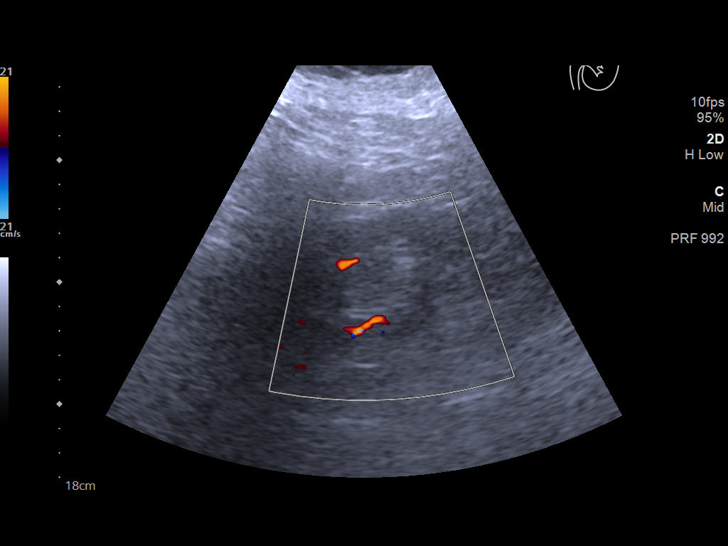
[im 36/38]
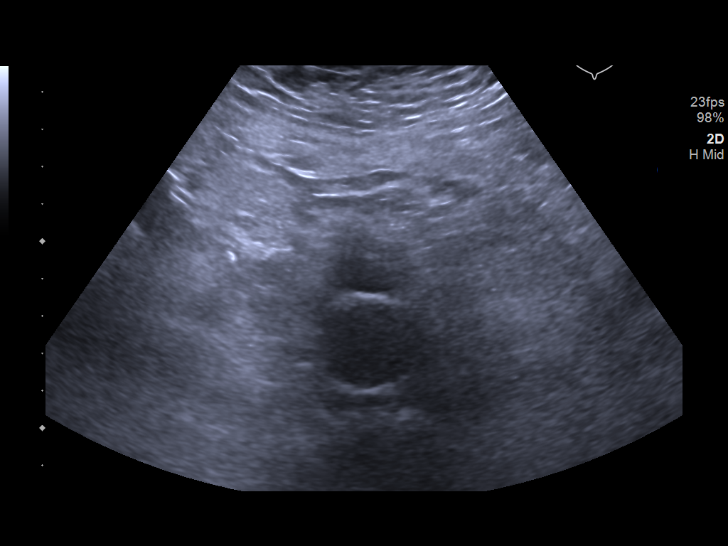

[Series 1001: us renal · 1 of 2 slices shown (2 of 2)]
[im 1/2]
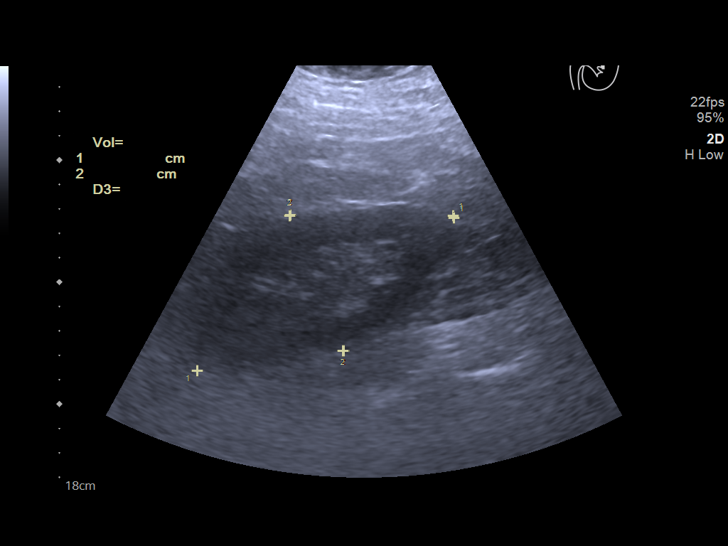

[14 of 25 positions shown; findings below may reference images not displayed]

FINDINGS: Right Kidney:

Renal measurements: 13.8 x 5.4 x 4.9 cm = volume: 192 mL .
Echogenicity within normal limits. No mass or hydronephrosis
visualized.

Left Kidney:

Renal measurements: 12.3 x 5.8 x 5.0 cm = volume: 202 mL.
Echogenicity within normal limits. No mass or hydronephrosis
visualized.

Bladder:

Decompressed secondary to Foley catheter.
IMPRESSION: No renal abnormality seen.

## 2020-10-25 DIAGNOSIS — J9610 Chronic respiratory failure, unspecified whether with hypoxia or hypercapnia: Secondary | ICD-10-CM | POA: Diagnosis not present

## 2020-10-27 DIAGNOSIS — J9610 Chronic respiratory failure, unspecified whether with hypoxia or hypercapnia: Secondary | ICD-10-CM | POA: Diagnosis not present

## 2020-10-31 ENCOUNTER — Other Ambulatory Visit: Payer: Self-pay | Admitting: Family Medicine

## 2020-11-06 ENCOUNTER — Other Ambulatory Visit: Payer: Self-pay | Admitting: Family Medicine

## 2020-11-09 ENCOUNTER — Ambulatory Visit: Payer: Medicare HMO | Admitting: Cardiology

## 2020-11-09 NOTE — Progress Notes (Deleted)
Cardiology Office Note:    Date:  11/09/2020   ID:  Jon [name], DOB 29-Oct-1950, MRN [id number]  PCP:  Rochel Brome, MD  Cardiologist:  Shirlee More, MD    Referring MD: Rochel Brome, MD    ASSESSMENT:    1. Hypertensive heart disease with heart failure ([place])   2. Chronic atrial fibrillation (HCC)   3. Long term (current) use of anticoagulants   4. Chronic obstructive pulmonary disease, unspecified COPD type ([place])    PLAN:    In order of problems listed above:  1. ***   Next appointment: ***   Medication Adjustments/Labs and Tests Ordered: Current medicines are reviewed at length with the patient today.  Concerns regarding medicines are outlined above.  No orders of the defined types were placed in this encounter.  No orders of the defined types were placed in this encounter.   No chief complaint on file.   History of Present Illness:    Jon [name] is a 71 y.o. male with a hx of hypertensive heart disease with heart failure 4 CKD, mild CAD, chronic atrial fibrillation rate related left bundle branch block hyperlipidemia and COPD last seen 08/07/2020 with severe peripheral edema and venous ulcerations.  Cardiac echo 09/07/2018 showed EF 55 to 60% mild aortic regurgitation mild to moderate mitral regurgitation and moderate tricuspid regurgitation.  Compliance with diet, lifestyle and medications: *** Past Medical History:  Diagnosis Date  . Abnormal EKG 11/29/2014  . Acute renal insufficiency 11/29/2014  . Acute venous embolism and thrombosis of deep vessels of distal lower extremity ([place]) 11/13/2019   Need to check venous dopplers for possible DVT due to tissure injury  . Angina   . Arthritis    "knees" (01/18/2014)  . Asthma   . Atrial fibrillation ([place])   . Back pain 01/25/2015  . 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 BOOP (bronchiolitis obliterans with organizing pneumonia) ([place]) 01/20/2014   OLBx 01/20/14 BOOP Started steroids 02/28/14   . CHF (congestive  heart failure) ([place])   . Chronic anticoagulation-Xarelto 02/09/2014  . Chronic atrial fibrillation ([place]) 01/03/2014  . Chronic bronchitis ([place]) 12/21/2013  . Chronic diastolic heart failure ([place]) 02/22/2015  . Chronic pain of both knees 03/18/2017   Overview:  Added automatically from request for surgery [id number]  . Chronic respiratory failure ([place]) 04/10/2015  . Chronic respiratory failure with hypoxia ([place]) 02/11/2020  . COPD (chronic obstructive pulmonary disease) ([place]) 01/25/2015  . Cough    coughing up blood- started last nite, seems to be worse now  . CVA (cerebral vascular accident) ([place]) 02/09/2014  . Diabetic polyneuropathy associated with type 2 diabetes mellitus ([place]) 12/21/2013  . Diabetic ulcer of toe of right foot associated with type 2 diabetes mellitus, with necrosis of bone ([place])   . DM (diabetes mellitus), type 2 with renal complications ([place]) 03/05/1024  . Drug induced constipation 11/10/2017  . Dysrhythmia    atrial fib takes cardizem,   . Fibromyalgia   . GERD (gastroesophageal reflux disease)   . H/O hiatal hernia   . Hemoptysis 08/01/2015  . Herpes zoster 06/26/2015  . History of stroke   . Hyperkalemia-repeat pending 11/29/2014  . Hyperlipemia   . Hyperlipidemia   . Hypertension   . Hypertensive heart disease with heart failure ([place])   . Hypothyroidism   . LBBB (left bundle branch block) 02/22/2015  . Long term (current) use of anticoagulants 03/21/2014  . Mild CAD 02/20/2015   Overview:  On recent cardiac cath  Overview:  On recent cardiac cath  . Myocardial infarction [facility])    "one dr says yes; another says no"  . Neuromuscular disorder (HCC)    rls, neuropathy  . Neuropathy    rls, neuropathy   . Normal coronary arteries 2011 11/29/2014  . Obesity (BMI 30-39.9)   . On home oxygen therapy    "2L w/CPAP at bedtime" (09/01/2018)  . OSA on CPAP    cpap & oxygen  . Pericardial effusion   . Peripheral vascular disease ([place])   . Peritoneal effusion, chronic 02/22/2015    Overview:  With surgical window  . PNA (pneumonia) 04/10/2015  . Pneumonia 5/15   "several times" (01/18/2014)  . Precordial pain 01/26/2015  . Preoperative cardiovascular examination 02/24/2016  . Primary osteoarthritis of both knees 03/18/2017   Overview:  Added automatically from request for surgery [id number]  . PVD (peripheral vascular disease) ([place]) 09/28/2018  . Restless leg syndrome   . Sleep apnea    cpap & oxygen   . Stroke [facility]) 2012   denies residual on 01/18/2014  . Troponin level elevated 11/29/2014  . Uncontrolled type 2 diabetes mellitus with diabetic polyneuropathy, with long-term current use of insulin ([place]) 12/21/2013   type ?     Past Surgical History:  Procedure Laterality Date  . ABDOMINAL AORTOGRAM W/LOWER EXTREMITY N/A 09/08/2018   Procedure: ABDOMINAL AORTOGRAM W/LOWER EXTREMITY;  Surgeon: Marty Heck, MD;  Location: [place] [facility];  Service: Cardiovascular;  Laterality: N/A;  . AMPUTATION Right 09/09/2018   Procedure: AMPUTATION RIGHT GREAT TOE;  Surgeon: Waynetta Sandy, MD;  Location: [place];  Service: Vascular;  Laterality: Right;  . AMPUTATION TOE Right 05/04/2020   Procedure: AMPUTATION TOE RIGHT 2nd TOE;  Surgeon: Evelina Bucy, DPM;  Location: [facility];  Service: Podiatry;  Laterality: Right;  . APPENDECTOMY    . CARDIAC CATHETERIZATION  X 2 then 03/03/2012    NL LVF, normal coronaries, vessels are small ([facility]: Dr. Beatrix Fetters)  . CATARACT EXTRACTION W/ INTRAOCULAR LENS  IMPLANT, BILATERAL Bilateral   . CHOLECYSTECTOMY    . HERNIA REPAIR     UHR  . LAPAROSCOPIC GASTRIC BANDING  2010  . LOWER EXTREMITY ANGIOGRAPHY Left 10/11/2018   Procedure: LOWER EXTREMITY ANGIOGRAPHY;  Surgeon: Waynetta Sandy, MD;  Location: [place] [facility];  Service: Cardiovascular;  Laterality: Left;  . PERICARDIAL WINDOW Left 01/24/2014   Procedure: PERICARDIAL WINDOW;  Surgeon: Gaye Pollack, MD;  Location: [place];  Service: Thoracic;  Laterality: Left;  .  PERIPHERAL VASCULAR INTERVENTION Right 09/08/2018   Procedure: PERIPHERAL VASCULAR INTERVENTION;  Surgeon: Marty Heck, MD;  Location: [place] [facility];  Service: Cardiovascular;  Laterality: Right;  . SINUS EXPLORATION  X 2  . TEE WITHOUT CARDIOVERSION N/A 09/07/2018   Procedure: TRANSESOPHAGEAL ECHOCARDIOGRAM (TEE);  Surgeon: Acie Fredrickson Wonda Cheng, MD;  Location: [place];  Service: Cardiovascular;  Laterality: N/A;  . UMBILICAL HERNIA REPAIR    . VIDEO ASSISTED THORACOSCOPY Left 01/24/2014   Procedure: VIDEO ASSISTED THORACOSCOPY;  Surgeon: Gaye Pollack, MD;  Location: [facility] OR;  Service: Thoracic;  Laterality: Left;  VATS/open lung biopsy  . VIDEO BRONCHOSCOPY Bilateral 12/29/2013   Procedure: VIDEO BRONCHOSCOPY WITHOUT FLUORO;  Surgeon: Elsie Stain, MD;  Location: [facility];  Service: Endoscopy;  Laterality: Bilateral;  . VIDEO BRONCHOSCOPY N/A 01/24/2014   Procedure: VIDEO BRONCHOSCOPY;  Surgeon: Gaye Pollack, MD;  Location: [facility] OR;  Service: Thoracic;  Laterality: N/A;    Current Medications: No  outpatient medications have been marked as taking for the 11/09/20 encounter (Appointment) with Richardo Priest, MD.     Allergies:   Hydrocodone, Morphine, Duloxetine hcl, Codeine, and Penicillins   Social History   Socioeconomic History  . Marital status: Widowed    Spouse name: Not on file  . Number of children: 3  . Years of education: Not on file  . Highest education level: Not on file  Occupational History  . Not on file  Tobacco Use  . Smoking status: Former Smoker    Types: Cigars    Quit date: 09/01/2006    Years since quitting: 14.2  . Smokeless tobacco: Never Used  Vaping Use  . Vaping Use: Never used  Substance and Sexual Activity  . 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 Drug use: No  . Sexual activity: Never  Other Topics Concern  . Not on file  Social History Narrative  . Not on file   Social Determinants of  Health   Financial Resource Strain: Not on file  Food Insecurity: Not on file  Transportation Needs: No Transportation Needs  . Lack of Transportation (Medical): No  . Lack of Transportation (Non-Medical): No  Physical Activity: Inactive  . Days of Exercise per Week: 0 days  . Minutes of Exercise per Session: 0 min  Stress: Not on file  Social Connections: Not on file     Family History: The patient's ***family history includes Cancer in his mother; Diabetes in his sister; Heart disease in his father; Seizures in his sister; Stroke in his father. There is no history of Anesthesia problems, Hypotension, Malignant hyperthermia, or Pseudochol deficiency. ROS:   Please see the history of present illness.    All other systems reviewed and are negative.  EKGs/Labs/Other Studies Reviewed:    The following studies were reviewed today:  EKG:  EKG ordered today and personally reviewed.  The ekg ordered today demonstrates ***  Recent Labs: 03/27/2020: TSH 1.560 07/05/2020: Hemoglobin 14.6; Platelets 316 08/28/2020: ALT 10; BUN 25; Creatinine, Ser 2.14; Potassium 3.2; Sodium 139  Recent Lipid Panel    Component Value Date/Time   CHOL 82 (L) 07/05/2020 0919   TRIG 39 07/05/2020 0919   HDL 39 (L) 07/05/2020 0919   CHOLHDL 2.1 07/05/2020 0919   CHOLHDL 2.9 01/31/2014 0337   VLDL 14 01/31/2014 0337   LDLCALC 32 07/05/2020 0919    Physical Exam:    VS:  There were no vitals taken for this visit.    Wt Readings from Last 3 Encounters:  08/28/20 253 lb (114.8 kg)  08/16/20 251 lb (113.9 kg)  08/07/20 246 lb 3.2 oz (111.7 kg)     GEN: *** Well nourished, well developed in no acute distress HEENT: Normal NECK: No JVD; No carotid bruits LYMPHATICS: No lymphadenopathy CARDIAC: ***RRR, no murmurs, rubs, gallops RESPIRATORY:  Clear to auscultation without rales, wheezing or rhonchi  ABDOMEN: Soft, non-tender, non-distended MUSCULOSKELETAL:  No edema; No deformity  SKIN: Warm and  dry NEUROLOGIC:  Alert and oriented x 3 PSYCHIATRIC:  Normal affect    Signed, Shirlee More, MD  11/09/2020 1:07 PM    [facility]

## 2020-11-10 ENCOUNTER — Other Ambulatory Visit: Payer: Self-pay | Admitting: Physician Assistant

## 2020-11-12 ENCOUNTER — Other Ambulatory Visit: Payer: Self-pay | Admitting: Family Medicine

## 2020-11-13 NOTE — Progress Notes (Addendum)
Acute Office Visit  Subjective:    Patient ID: Jon [name], male    DOB: 07-12-1951, 70 y.o.   MRN: [id number]  Chief Complaint  Patient presents with  . Wound on buttock    HPI Patient is a poorly controlled diabetic, who presents today for wound on right buttock x 2 weeks. Has been applying a cream and bandaid daily. Sugars run 70 - 300s. Overdue for chronic medical issues.   Past Medical History:  Diagnosis Date  . Abnormal EKG 11/29/2014  . Acute renal insufficiency 11/29/2014  . Acute venous embolism and thrombosis of deep vessels of distal lower extremity ([place]) 11/13/2019   Need to check venous dopplers for possible DVT due to tissure injury  . Angina   . Arthritis    "knees" (01/18/2014)  . Asthma   . Atrial fibrillation ([place])   . Back pain 01/25/2015  . 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 BOOP (bronchiolitis obliterans with organizing pneumonia) ([place]) 01/20/2014   OLBx 01/20/14 BOOP Started steroids 02/28/14   . CHF (congestive heart failure) ([place])   . Chronic anticoagulation-Xarelto 02/09/2014  . Chronic atrial fibrillation ([place]) 01/03/2014  . Chronic bronchitis ([place]) 12/21/2013  . Chronic diastolic heart failure ([place]) 02/22/2015  . Chronic pain of both knees 03/18/2017   Overview:  Added automatically from request for surgery [id number]  . Chronic respiratory failure ([place]) 04/10/2015  . Chronic respiratory failure with hypoxia ([place]) 02/11/2020  . COPD (chronic obstructive pulmonary disease) ([place]) 01/25/2015  . Cough    coughing up blood- started last nite, seems to be worse now  . CVA (cerebral vascular accident) ([place]) 02/09/2014  . Diabetic polyneuropathy associated with type 2 diabetes mellitus ([place]) 12/21/2013  . Diabetic ulcer of toe of right foot associated with type 2 diabetes mellitus, with necrosis of bone ([place])   . DM (diabetes mellitus), type 2 with renal complications ([place]) 11/04/1441  . Drug induced constipation 11/10/2017  . Dysrhythmia    atrial fib takes cardizem,    . Fibromyalgia   . GERD (gastroesophageal reflux disease)   . H/O hiatal hernia   . Hemoptysis 08/01/2015  . Herpes zoster 06/26/2015  . History of stroke   . Hyperkalemia-repeat pending 11/29/2014  . Hyperlipemia   . Hyperlipidemia   . Hypertension   . Hypertensive heart disease with heart failure ([place])   . Hypothyroidism   . LBBB (left bundle branch block) 02/22/2015  . Long term (current) use of anticoagulants 03/21/2014  . Mild CAD 02/20/2015   Overview:  On recent cardiac cath  Overview:  On recent cardiac cath  . Myocardial infarction [facility])    "one dr says yes; another says no"  . Neuromuscular disorder (HCC)    rls, neuropathy  . Neuropathy    rls, neuropathy   . Normal coronary arteries 2011 11/29/2014  . Obesity (BMI 30-39.9)   . On home oxygen therapy    "2L w/CPAP at bedtime" (09/01/2018)  . OSA on CPAP    cpap & oxygen  . Pericardial effusion   . Peripheral vascular disease ([place])   . Peritoneal effusion, chronic 02/22/2015   Overview:  With surgical window  . PNA (pneumonia) 04/10/2015  . Pneumonia 5/15   "several times" (01/18/2014)  . Precordial pain 01/26/2015  . Preoperative cardiovascular examination 02/24/2016  . Primary osteoarthritis of both knees 03/18/2017   Overview:  Added automatically from request for surgery [id number]  . PVD (peripheral vascular disease) ([place]) 09/28/2018  . Restless leg syndrome   .  Sleep apnea    cpap & oxygen   . Stroke [facility]) 2012   denies residual on 01/18/2014  . Troponin level elevated 11/29/2014  . Uncontrolled type 2 diabetes mellitus with diabetic polyneuropathy, with long-term current use of insulin ([place]) 12/21/2013   type ?     Past Surgical History:  Procedure Laterality Date  . ABDOMINAL AORTOGRAM W/LOWER EXTREMITY N/A 09/08/2018   Procedure: ABDOMINAL AORTOGRAM W/LOWER EXTREMITY;  Surgeon: Marty Heck, MD;  Location: [place] [facility];  Service: Cardiovascular;  Laterality: N/A;  . AMPUTATION Right 09/09/2018    Procedure: AMPUTATION RIGHT GREAT TOE;  Surgeon: Waynetta Sandy, MD;  Location: [place];  Service: Vascular;  Laterality: Right;  . AMPUTATION TOE Right 05/04/2020   Procedure: AMPUTATION TOE RIGHT 2nd TOE;  Surgeon: Evelina Bucy, DPM;  Location: [facility];  Service: Podiatry;  Laterality: Right;  . APPENDECTOMY    . CARDIAC CATHETERIZATION  X 2 then 03/03/2012    NL LVF, normal coronaries, vessels are small ([facility]: Dr. Beatrix Fetters)  . CATARACT EXTRACTION W/ INTRAOCULAR LENS  IMPLANT, BILATERAL Bilateral   . CHOLECYSTECTOMY    . HERNIA REPAIR     UHR  . LAPAROSCOPIC GASTRIC BANDING  2010  . LOWER EXTREMITY ANGIOGRAPHY Left 10/11/2018   Procedure: LOWER EXTREMITY ANGIOGRAPHY;  Surgeon: Waynetta Sandy, MD;  Location: [place] [facility];  Service: Cardiovascular;  Laterality: Left;  . PERICARDIAL WINDOW Left 01/24/2014   Procedure: PERICARDIAL WINDOW;  Surgeon: Gaye Pollack, MD;  Location: [place];  Service: Thoracic;  Laterality: Left;  . PERIPHERAL VASCULAR INTERVENTION Right 09/08/2018   Procedure: PERIPHERAL VASCULAR INTERVENTION;  Surgeon: Marty Heck, MD;  Location: [place] [facility] LAB;  Service: Cardiovascular;  Laterality: Right;  . SINUS EXPLORATION  X 2  . TEE WITHOUT CARDIOVERSION N/A 09/07/2018   Procedure: TRANSESOPHAGEAL ECHOCARDIOGRAM (TEE);  Surgeon: Acie Fredrickson Wonda Cheng, MD;  Location: [place];  Service: Cardiovascular;  Laterality: N/A;  . UMBILICAL HERNIA REPAIR    . VIDEO ASSISTED THORACOSCOPY Left 01/24/2014   Procedure: VIDEO ASSISTED THORACOSCOPY;  Surgeon: Gaye Pollack, MD;  Location[facility] OR;  Service: Thoracic;  Laterality: Left;  VATS/open lung biopsy  . VIDEO BRONCHOSCOPY Bilateral 12/29/2013   Procedure: VIDEO BRONCHOSCOPY WITHOUT FLUORO;  Surgeon: Elsie Stain, MD;  Location: [facility];  Service: Endoscopy;  Laterality: Bilateral;  . VIDEO BRONCHOSCOPY N/A 01/24/2014   Procedure: VIDEO BRONCHOSCOPY;  Surgeon: Gaye Pollack, MD;  Location: [facility] OR;   Service: Thoracic;  Laterality: N/A;    Family History  Problem Relation Age of Onset  . Cancer Mother   . Stroke Father   . Heart disease Father   . Seizures Sister   . Diabetes Sister   . Anesthesia problems Neg Hx   . Hypotension Neg Hx   . Malignant hyperthermia Neg Hx   . Pseudochol deficiency Neg Hx     Social History   Socioeconomic History  . Marital status: Widowed    Spouse name: Not on file  . Number of children: 3  . Years of education: Not on file  . Highest education level: Not on file  Occupational History  . Not on file  Tobacco Use  . Smoking status: Former Smoker    Types: Cigars    Quit date: 09/01/2006    Years since quitting: 14.2  . Smokeless tobacco: Never Used  Vaping Use  . Vaping Use: Never used  Substance and Sexual Activity  . 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 Drug use: No  . Sexual activity: Never  Other Topics Concern  . Not on file  Social History Narrative  . Not on file   Social Determinants of Health   Financial Resource Strain: Not on file  Food Insecurity: Not on file  Transportation Needs: No Transportation Needs  . Lack of Transportation (Medical): No  . Lack of Transportation (Non-Medical): No  Physical Activity: Inactive  . Days of Exercise per Week: 0 days  . Minutes of Exercise per Session: 0 min  Stress: Not on file  Social Connections: Not on file  Intimate Partner Violence: Not on file    Outpatient Medications Prior to Visit  Medication Sig Dispense Refill  . albuterol (PROVENTIL) (2.5 MG/3ML) 0.083% nebulizer solution Take 3 mLs (2.5 mg total) by nebulization 2 (two) times daily as needed for wheezing. 200 mL 1  . atorvastatin (LIPITOR) 40 MG tablet TAKE 1 TABLET BY MOUTH ONCE DAILY 90 tablet 1  . Azelastine HCl 0.15 % SOLN USE 1 SPRAY IN EACH NOSTRIL TWICE DAILY 30 mL 11  . BD PEN NEEDLE NANO 2ND GEN 32G X 4 MM MISC USE DAILY WITH BASAGLAR 100 each 3  . clopidogrel  (PLAVIX) 75 MG tablet TAKE 1 TABLET BY MOUTH DAILY 30 tablet 5  . Continuous Blood Gluc Receiver (DEXCOM G6 RECEIVER) DEVI 1 each by Does not apply route 4 (four) times daily -  before meals and at bedtime. (Patient not taking: Reported on 10/18/2020) 1 each 0  . Continuous Blood Gluc Sensor (DEXCOM G6 SENSOR) MISC Apply every 10 days (Patient not taking: Reported on 10/18/2020) 3 each 3  . diltiazem (CARDIZEM CD) 120 MG 24 hr capsule TAKE 1 CAPSULE BY MOUTH DAILY 90 capsule 1  . docusate sodium (COLACE) 100 MG capsule Take 100 mg by mouth 2 (two) times daily.    . famotidine (PEPCID) 20 MG tablet TAKE 2 TABLETS BY MOUTH TWICE DAILY 120 tablet 5  . ferrous sulfate 325 (65 FE) MG tablet Take 325 mg by mouth daily.    . Glucagon, rDNA, (GLUCAGON EMERGENCY) 1 MG KIT Inject 1 mg as directed as needed (low blood sugar).     . hydrALAZINE (APRESOLINE) 100 MG tablet TAKE ONE (1) TABLET BY MOUTH 3 TIMES DAILY. DISCONTINE DILTIAZEM. 90 tablet 2  . insulin detemir (LEVEMIR FLEXTOUCH) 100 UNIT/ML FlexPen Inject 30 Units into the skin daily. (Patient taking differently: Inject 26 Units into the skin daily.) 15 mL   . Insulin Regular Human (NOVOLIN R FLEXPEN) 100 UNIT/ML SOPN PER SLIDING SCALE ADMINSTER AS FOLLOWS: >150-200 2 U,201-250 4 U,251-300 8 U, 301-350 12 U,351-400 16 U,>400 20 U before each meal. (Patient not taking: Reported on 10/18/2020) 54 mL 1  . isosorbide mononitrate (IMDUR) 120 MG 24 hr tablet TAKE 1 TABLET BY MOUTH ONCE DAILY IN THEMORNINGS 90 tablet 1  . lactulose (CHRONULAC) 10 GM/15ML solution TAKE 30MLS BY MOUTH IN THE MORNING AND AT BEDTIME 946 mL 2  . levocetirizine (XYZAL) 5 MG tablet TAKE 1 TABLET BY MOUTH EVERY EVENING 90 tablet 1  . metolazone (ZAROXOLYN) 2.5 MG tablet TAKE 1 TABLET BY MOUTH DAILY 3 HOURS PRIOR TO LASIX DAILY 36 tablet 5  . metoprolol succinate (TOPROL-XL) 50 MG 24 hr tablet TAKE 1 TABLET BY MOUTH ONCE DAILY 90 tablet 1  . mometasone-formoterol (DULERA) 200-5 MCG/ACT AERO  Inhale 2 puffs into the lungs 2 (two) times daily  as needed for wheezing.    Marland Kitchen MOVANTIK 25 MG TABS tablet TAKE 1 TABLET BY MOUTH DAILY 30 tablet 11  . nitroGLYCERIN (NITROSTAT) 0.4 MG SL tablet Place 0.4 mg under the tongue every 5 (five) minutes as needed for chest pain.     Marland Kitchen oxyCODONE-acetaminophen (PERCOCET/ROXICET) 5-325 MG tablet TAKE 1 TABLET BY MOUTH EVERY 4 HOURS AS NEEDED FOR SEVERE PAIN 20 tablet 0  . OXYGEN Inhale into the lungs. 2 liters at bedtime    . OZEMPIC, 1 MG/DOSE, 4 MG/3ML SOPN INJECT 1MG INTO THE SKIN ONCE A WEEK 3 mL 2  . pantoprazole (PROTONIX) 40 MG tablet Take 40 mg by mouth in the morning.     . Potassium Chloride ER 20 MEQ TBCR Take 40 mEq by mouth 2 (two) times daily.     . potassium chloride SA (KLOR-CON) 20 MEQ tablet TAKE TWO TABLETS BY MOUTH TWICE A DAY 360 tablet 0  . pregabalin (LYRICA) 75 MG capsule TAKE 1 CAPSULE BY MOUTH TWICE DAILY 180 capsule 2  . sertraline (ZOLOFT) 50 MG tablet TAKE 1 TABLET BY MOUTH DAILY 90 tablet 1  . tamsulosin (FLOMAX) 0.4 MG CAPS capsule TAKE 2 CAPSULES BY MOUTH DAILY AFTER SUPPER 60 capsule 1  . torsemide (DEMADEX) 20 MG tablet TAKE 3 TABLETS BY MOUTH AT LUNCH AND TAKE 3 TABLETS AT SUPPER 540 tablet 1  . Vitamin D, Ergocalciferol, (DRISDOL) 1.25 MG (50000 UNIT) CAPS capsule TAKE 1 CAPSULE BY MOUTH DAILY 90 capsule 1  . XARELTO 15 MG TABS tablet TAKE 1 TABLET BY MOUTH ONCE DAILY (Patient taking differently: Take 15 mg by mouth at bedtime. ) 90 tablet 2  . XTAMPZA ER 27 MG C12A TAKE 1 CAPSULE BY MOUTH TWICE DAILY 60 capsule 0   No facility-administered medications prior to visit.    Allergies  Allergen Reactions  . Hydrocodone Itching  . Morphine Itching  . Duloxetine Hcl Other (See Comments)    Unknown reaction  . Codeine Itching  . Penicillins Itching and Rash    DID THE REACTION INVOLVE: Swelling of the face/tongue/throat, SOB, or low BP? No Sudden or severe rash/hives, skin peeling, or the inside of the mouth or nose?  No Did it require medical treatment? No When did it last happen?unknown If all above answers are "NO", may proceed with cephalosporin use.     Review of Systems  Constitutional: Negative for chills, fatigue and fever.  HENT: Negative for congestion and rhinorrhea.   Respiratory: Positive for shortness of breath.   Cardiovascular: Negative for chest pain.  Skin: Positive for wound (left mid buttocks).       Objective:    Physical Exam Vitals reviewed.  Constitutional:      Appearance: Normal appearance. He is obese.  Skin:    Findings: Lesion (Large softball size abscess of uppler left buttock. indurated, but no fluctuance. very tender. skin peeled of centrally.) present.  Neurological:     Mental Status: He is alert.    BP 136/68   Pulse 91   Temp (!) 97.4 F (36.3 C)   Ht '5\' 7"'  (1.702 m)   Wt 250 lb (113.4 kg)   SpO2 95%   BMI 39.16 kg/m  Wt Readings from Last 3 Encounters:  11/14/20 250 lb (113.4 kg)  08/28/20 253 lb (114.8 kg)  08/16/20 251 lb (113.9 kg)    Health Maintenance Due  Topic Date Due  . Hepatitis C Screening  Never done  . FOOT EXAM  02/28/2016  There are no preventive care reminders to display for this patient.   Lab Results  Component Value Date   TSH 1.030 11/14/2020   Lab Results  Component Value Date   WBC 7.1 07/05/2020   HGB 14.6 07/05/2020   HCT 43.3 07/05/2020   MCV 84 07/05/2020   PLT 316 07/05/2020   Lab Results  Component Value Date   NA 139 08/28/2020   K 3.2 (L) 08/28/2020   CO2 25 08/28/2020   GLUCOSE 146 (H) 08/28/2020   BUN 25 08/28/2020   CREATININE 2.14 (H) 08/28/2020   BILITOT 0.5 08/28/2020   ALKPHOS 115 08/28/2020   AST 16 08/28/2020   ALT 10 08/28/2020   PROT 6.0 08/28/2020   ALBUMIN 3.5 (L) 08/28/2020   CALCIUM 8.1 (L) 08/28/2020   ANIONGAP 13 05/01/2020   Lab Results  Component Value Date   CHOL 98 (L) 11/14/2020   Lab Results  Component Value Date   HDL 37 (L) 11/14/2020   Lab  Results  Component Value Date   LDLCALC 41 11/14/2020   Lab Results  Component Value Date   TRIG 106 11/14/2020   Lab Results  Component Value Date   CHOLHDL 2.6 11/14/2020   Lab Results  Component Value Date   HGBA1C 9.2 (H) 11/14/2020       Assessment & Plan:  1. Left buttock abscess Sent to ED for surgical evaluation.  Likely needs IV antibiotics. - cefTRIAXone (ROCEPHIN) injection 1 g  2. Hypertensive heart disease with heart failure ([place]) The current medical regimen is effective;  continue present plan and medications.  3. Mixed hyperlipidemia Pt needs to return for chronic visit.  - Lipid panel  4. Uncontrolled type 2 diabetes mellitus with hyperglycemia (HCC) - Hemoglobin A1c  5. Acquired hypothyroidism - TSH    Meds ordered this encounter  Medications  . cefTRIAXone (ROCEPHIN) injection 1 g    Orders Placed This Encounter  Procedures  . Hemoglobin A1c  . Lipid panel  . TSH  . Cardiovascular Risk Assessment    Follow-up: after hospitalization  An After Visit Summary was printed and given to the patient.  Rochel Brome, MD [facility] [phone]

## 2020-11-13 NOTE — Progress Notes (Incomplete Revision)
Acute Office Visit  Subjective:    Patient ID: Jon [name], male    DOB: September 26, 1950, 70 y.o.   MRN: [id number]  Chief Complaint  Patient presents with  . Wound on buttock    HPI Patient is a poorly controlled diabetic, who presents today for wound on right buttock x 2 weeks. Has been applying a cream and bandaid daily. Sugars run 70 - 300s. Overdue for chronic medical issues.   Past Medical History:  Diagnosis Date  . Abnormal EKG 11/29/2014  . Acute renal insufficiency 11/29/2014  . Acute venous embolism and thrombosis of deep vessels of distal lower extremity ([place]) 11/13/2019   Need to check venous dopplers for possible DVT due to tissure injury  . Angina   . Arthritis    "knees" (01/18/2014)  . Asthma   . Atrial fibrillation ([place])   . Back pain 01/25/2015  . 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 BOOP (bronchiolitis obliterans with organizing pneumonia) ([place]) 01/20/2014   OLBx 01/20/14 BOOP Started steroids 02/28/14   . CHF (congestive heart failure) ([place])   . Chronic anticoagulation-Xarelto 02/09/2014  . Chronic atrial fibrillation ([place]) 01/03/2014  . Chronic bronchitis ([place]) 12/21/2013  . Chronic diastolic heart failure ([place]) 02/22/2015  . Chronic pain of both knees 03/18/2017   Overview:  Added automatically from request for surgery [id number]  . Chronic respiratory failure ([place]) 04/10/2015  . Chronic respiratory failure with hypoxia ([place]) 02/11/2020  . COPD (chronic obstructive pulmonary disease) ([place]) 01/25/2015  . Cough    coughing up blood- started last nite, seems to be worse now  . CVA (cerebral vascular accident) ([place]) 02/09/2014  . Diabetic polyneuropathy associated with type 2 diabetes mellitus ([place]) 12/21/2013  . Diabetic ulcer of toe of right foot associated with type 2 diabetes mellitus, with necrosis of bone ([place])   . DM (diabetes mellitus), type 2 with renal complications ([place]) 7/0/6237  . Drug induced constipation 11/10/2017  . Dysrhythmia    atrial fib takes cardizem,    . Fibromyalgia   . GERD (gastroesophageal reflux disease)   . H/O hiatal hernia   . Hemoptysis 08/01/2015  . Herpes zoster 06/26/2015  . History of stroke   . Hyperkalemia-repeat pending 11/29/2014  . Hyperlipemia   . Hyperlipidemia   . Hypertension   . Hypertensive heart disease with heart failure ([place])   . Hypothyroidism   . LBBB (left bundle branch block) 02/22/2015  . Long term (current) use of anticoagulants 03/21/2014  . Mild CAD 02/20/2015   Overview:  On recent cardiac cath  Overview:  On recent cardiac cath  . Myocardial infarction [facility])    "one dr says yes; another says no"  . Neuromuscular disorder (HCC)    rls, neuropathy  . Neuropathy    rls, neuropathy   . Normal coronary arteries 2011 11/29/2014  . Obesity (BMI 30-39.9)   . On home oxygen therapy    "2L w/CPAP at bedtime" (09/01/2018)  . OSA on CPAP    cpap & oxygen  . Pericardial effusion   . Peripheral vascular disease ([place])   . Peritoneal effusion, chronic 02/22/2015   Overview:  With surgical window  . PNA (pneumonia) 04/10/2015  . Pneumonia 5/15   "several times" (01/18/2014)  . Precordial pain 01/26/2015  . Preoperative cardiovascular examination 02/24/2016  . Primary osteoarthritis of both knees 03/18/2017   Overview:  Added automatically from request for surgery [id number]  . PVD (peripheral vascular disease) ([place]) 09/28/2018  . Restless leg syndrome   .  Sleep apnea    cpap & oxygen   . Stroke [facility]) 2012   denies residual on 01/18/2014  . Troponin level elevated 11/29/2014  . Uncontrolled type 2 diabetes mellitus with diabetic polyneuropathy, with long-term current use of insulin ([place]) 12/21/2013   type ?     Past Surgical History:  Procedure Laterality Date  . ABDOMINAL AORTOGRAM W/LOWER EXTREMITY N/A 09/08/2018   Procedure: ABDOMINAL AORTOGRAM W/LOWER EXTREMITY;  Surgeon: Marty Heck, MD;  Location: [place] [facility];  Service: Cardiovascular;  Laterality: N/A;  . AMPUTATION Right 09/09/2018    Procedure: AMPUTATION RIGHT GREAT TOE;  Surgeon: Waynetta Sandy, MD;  Location: [place];  Service: Vascular;  Laterality: Right;  . AMPUTATION TOE Right 05/04/2020   Procedure: AMPUTATION TOE RIGHT 2nd TOE;  Surgeon: Evelina Bucy, DPM;  Location: [facility];  Service: Podiatry;  Laterality: Right;  . APPENDECTOMY    . CARDIAC CATHETERIZATION  X 2 then 03/03/2012    NL LVF, normal coronaries, vessels are small ([facility]: Dr. Beatrix Fetters)  . CATARACT EXTRACTION W/ INTRAOCULAR LENS  IMPLANT, BILATERAL Bilateral   . CHOLECYSTECTOMY    . HERNIA REPAIR     UHR  . LAPAROSCOPIC GASTRIC BANDING  2010  . LOWER EXTREMITY ANGIOGRAPHY Left 10/11/2018   Procedure: LOWER EXTREMITY ANGIOGRAPHY;  Surgeon: Waynetta Sandy, MD;  Location: [place] [facility];  Service: Cardiovascular;  Laterality: Left;  . PERICARDIAL WINDOW Left 01/24/2014   Procedure: PERICARDIAL WINDOW;  Surgeon: Gaye Pollack, MD;  Location: [place];  Service: Thoracic;  Laterality: Left;  . PERIPHERAL VASCULAR INTERVENTION Right 09/08/2018   Procedure: PERIPHERAL VASCULAR INTERVENTION;  Surgeon: Marty Heck, MD;  Location: [place] [facility];  Service: Cardiovascular;  Laterality: Right;  . SINUS EXPLORATION  X 2  . TEE WITHOUT CARDIOVERSION N/A 09/07/2018   Procedure: TRANSESOPHAGEAL ECHOCARDIOGRAM (TEE);  Surgeon: Acie Fredrickson Wonda Cheng, MD;  Location: [place];  Service: Cardiovascular;  Laterality: N/A;  . UMBILICAL HERNIA REPAIR    . VIDEO ASSISTED THORACOSCOPY Left 01/24/2014   Procedure: VIDEO ASSISTED THORACOSCOPY;  Surgeon: Gaye Pollack, MD;  Location: [facility];  Service: Thoracic;  Laterality: Left;  VATS/open lung biopsy  . VIDEO BRONCHOSCOPY Bilateral 12/29/2013   Procedure: VIDEO BRONCHOSCOPY WITHOUT FLUORO;  Surgeon: Elsie Stain, MD;  [facility];  Service: Endoscopy;  Laterality: Bilateral;  . VIDEO BRONCHOSCOPY N/A 01/24/2014   Procedure: VIDEO BRONCHOSCOPY;  Surgeon: Gaye Pollack, MD;  Location: [facility] OR;   Service: Thoracic;  Laterality: N/A;    Family History  Problem Relation Age of Onset  . Cancer Mother   . Stroke Father   . Heart disease Father   . Seizures Sister   . Diabetes Sister   . Anesthesia problems Neg Hx   . Hypotension Neg Hx   . Malignant hyperthermia Neg Hx   . Pseudochol deficiency Neg Hx     Social History   Socioeconomic History  . Marital status: Widowed    Spouse name: Not on file  . Number of children: 3  . Years of education: Not on file  . Highest education level: Not on file  Occupational History  . Not on file  Tobacco Use  . Smoking status: Former Smoker    Types: Cigars    Quit date: 09/01/2006    Years since quitting: 14.2  . Smokeless tobacco: Never Used  Vaping Use  . Vaping Use: Never used  Substance and Sexual Activity  . 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 Drug use: No  . Sexual activity: Never  Other Topics Concern  . Not on file  Social History Narrative  . Not on file   Social Determinants of Health   Financial Resource Strain: Not on file  Food Insecurity: Not on file  Transportation Needs: No Transportation Needs  . Lack of Transportation (Medical): No  . Lack of Transportation (Non-Medical): No  Physical Activity: Inactive  . Days of Exercise per Week: 0 days  . Minutes of Exercise per Session: 0 min  Stress: Not on file  Social Connections: Not on file  Intimate Partner Violence: Not on file    Outpatient Medications Prior to Visit  Medication Sig Dispense Refill  . albuterol (PROVENTIL) (2.5 MG/3ML) 0.083% nebulizer solution Take 3 mLs (2.5 mg total) by nebulization 2 (two) times daily as needed for wheezing. 200 mL 1  . atorvastatin (LIPITOR) 40 MG tablet TAKE 1 TABLET BY MOUTH ONCE DAILY 90 tablet 1  . Azelastine HCl 0.15 % SOLN USE 1 SPRAY IN EACH NOSTRIL TWICE DAILY 30 mL 11  . BD PEN NEEDLE NANO 2ND GEN 32G X 4 MM MISC USE DAILY WITH BASAGLAR 100 each 3  . clopidogrel  (PLAVIX) 75 MG tablet TAKE 1 TABLET BY MOUTH DAILY 30 tablet 5  . Continuous Blood Gluc Receiver (DEXCOM G6 RECEIVER) DEVI 1 each by Does not apply route 4 (four) times daily -  before meals and at bedtime. (Patient not taking: Reported on 10/18/2020) 1 each 0  . Continuous Blood Gluc Sensor (DEXCOM G6 SENSOR) MISC Apply every 10 days (Patient not taking: Reported on 10/18/2020) 3 each 3  . diltiazem (CARDIZEM CD) 120 MG 24 hr capsule TAKE 1 CAPSULE BY MOUTH DAILY 90 capsule 1  . docusate sodium (COLACE) 100 MG capsule Take 100 mg by mouth 2 (two) times daily.    . famotidine (PEPCID) 20 MG tablet TAKE 2 TABLETS BY MOUTH TWICE DAILY 120 tablet 5  . ferrous sulfate 325 (65 FE) MG tablet Take 325 mg by mouth daily.    . Glucagon, rDNA, (GLUCAGON EMERGENCY) 1 MG KIT Inject 1 mg as directed as needed (low blood sugar).     . hydrALAZINE (APRESOLINE) 100 MG tablet TAKE ONE (1) TABLET BY MOUTH 3 TIMES DAILY. DISCONTINE DILTIAZEM. 90 tablet 2  . insulin detemir (LEVEMIR FLEXTOUCH) 100 UNIT/ML FlexPen Inject 30 Units into the skin daily. (Patient taking differently: Inject 26 Units into the skin daily.) 15 mL   . Insulin Regular Human (NOVOLIN R FLEXPEN) 100 UNIT/ML SOPN PER SLIDING SCALE ADMINSTER AS FOLLOWS: >150-200 2 U,201-250 4 U,251-300 8 U, 301-350 12 U,351-400 16 U,>400 20 U before each meal. (Patient not taking: Reported on 10/18/2020) 54 mL 1  . isosorbide mononitrate (IMDUR) 120 MG 24 hr tablet TAKE 1 TABLET BY MOUTH ONCE DAILY IN THEMORNINGS 90 tablet 1  . lactulose (CHRONULAC) 10 GM/15ML solution TAKE 30MLS BY MOUTH IN THE MORNING AND AT BEDTIME 946 mL 2  . levocetirizine (XYZAL) 5 MG tablet TAKE 1 TABLET BY MOUTH EVERY EVENING 90 tablet 1  . metolazone (ZAROXOLYN) 2.5 MG tablet TAKE 1 TABLET BY MOUTH DAILY 3 HOURS PRIOR TO LASIX DAILY 36 tablet 5  . metoprolol succinate (TOPROL-XL) 50 MG 24 hr tablet TAKE 1 TABLET BY MOUTH ONCE DAILY 90 tablet 1  . mometasone-formoterol (DULERA) 200-5 MCG/ACT AERO  Inhale 2 puffs into the lungs 2 (two) times daily  as needed for wheezing.    Marland Kitchen MOVANTIK 25 MG TABS tablet TAKE 1 TABLET BY MOUTH DAILY 30 tablet 11  . nitroGLYCERIN (NITROSTAT) 0.4 MG SL tablet Place 0.4 mg under the tongue every 5 (five) minutes as needed for chest pain.     Marland Kitchen oxyCODONE-acetaminophen (PERCOCET/ROXICET) 5-325 MG tablet TAKE 1 TABLET BY MOUTH EVERY 4 HOURS AS NEEDED FOR SEVERE PAIN 20 tablet 0  . OXYGEN Inhale into the lungs. 2 liters at bedtime    . OZEMPIC, 1 MG/DOSE, 4 MG/3ML SOPN INJECT 1MG INTO THE SKIN ONCE A WEEK 3 mL 2  . pantoprazole (PROTONIX) 40 MG tablet Take 40 mg by mouth in the morning.     . Potassium Chloride ER 20 MEQ TBCR Take 40 mEq by mouth 2 (two) times daily.     . potassium chloride SA (KLOR-CON) 20 MEQ tablet TAKE TWO TABLETS BY MOUTH TWICE A DAY 360 tablet 0  . pregabalin (LYRICA) 75 MG capsule TAKE 1 CAPSULE BY MOUTH TWICE DAILY 180 capsule 2  . sertraline (ZOLOFT) 50 MG tablet TAKE 1 TABLET BY MOUTH DAILY 90 tablet 1  . tamsulosin (FLOMAX) 0.4 MG CAPS capsule TAKE 2 CAPSULES BY MOUTH DAILY AFTER SUPPER 60 capsule 1  . torsemide (DEMADEX) 20 MG tablet TAKE 3 TABLETS BY MOUTH AT LUNCH AND TAKE 3 TABLETS AT SUPPER 540 tablet 1  . Vitamin D, Ergocalciferol, (DRISDOL) 1.25 MG (50000 UNIT) CAPS capsule TAKE 1 CAPSULE BY MOUTH DAILY 90 capsule 1  . XARELTO 15 MG TABS tablet TAKE 1 TABLET BY MOUTH ONCE DAILY (Patient taking differently: Take 15 mg by mouth at bedtime. ) 90 tablet 2  . XTAMPZA ER 27 MG C12A TAKE 1 CAPSULE BY MOUTH TWICE DAILY 60 capsule 0   No facility-administered medications prior to visit.    Allergies  Allergen Reactions  . Hydrocodone Itching  . Morphine Itching  . Duloxetine Hcl Other (See Comments)    Unknown reaction  . Codeine Itching  . Penicillins Itching and Rash    DID THE REACTION INVOLVE: Swelling of the face/tongue/throat, SOB, or low BP? No Sudden or severe rash/hives, skin peeling, or the inside of the mouth or nose?  No Did it require medical treatment? No When did it last happen?unknown If all above answers are "NO", may proceed with cephalosporin use.     Review of Systems  Constitutional: Negative for chills, fatigue and fever.  HENT: Negative for congestion and rhinorrhea.   Respiratory: Positive for shortness of breath.   Cardiovascular: Negative for chest pain.  Skin: Positive for wound (left mid buttocks).       Objective:    Physical Exam Vitals reviewed.  Constitutional:      Appearance: Normal appearance. He is obese.  Skin:    Findings: Lesion (Large softball size abscess of uppler left buttock. indurated, but no fluctuance. very tender. skin peeled of centrally.) present.  Neurological:     Mental Status: He is alert.    BP 136/68   Pulse 91   Temp (!) 97.4 F (36.3 C)   Ht '5\' 7"'  (1.702 m)   Wt 250 lb (113.4 kg)   SpO2 95%   BMI 39.16 kg/m  Wt Readings from Last 3 Encounters:  11/14/20 250 lb (113.4 kg)  08/28/20 253 lb (114.8 kg)  08/16/20 251 lb (113.9 kg)    Health Maintenance Due  Topic Date Due  . Hepatitis C Screening  Never done  . FOOT EXAM  02/28/2016  There are no preventive care reminders to display for this patient.   Lab Results  Component Value Date   TSH 1.030 11/14/2020   Lab Results  Component Value Date   WBC 7.1 07/05/2020   HGB 14.6 07/05/2020   HCT 43.3 07/05/2020   MCV 84 07/05/2020   PLT 316 07/05/2020   Lab Results  Component Value Date   NA 139 08/28/2020   K 3.2 (L) 08/28/2020   CO2 25 08/28/2020   GLUCOSE 146 (H) 08/28/2020   BUN 25 08/28/2020   CREATININE 2.14 (H) 08/28/2020   BILITOT 0.5 08/28/2020   ALKPHOS 115 08/28/2020   AST 16 08/28/2020   ALT 10 08/28/2020   PROT 6.0 08/28/2020   ALBUMIN 3.5 (L) 08/28/2020   CALCIUM 8.1 (L) 08/28/2020   ANIONGAP 13 05/01/2020   Lab Results  Component Value Date   CHOL 98 (L) 11/14/2020   Lab Results  Component Value Date   HDL 37 (L) 11/14/2020   Lab  Results  Component Value Date   LDLCALC 41 11/14/2020   Lab Results  Component Value Date   TRIG 106 11/14/2020   Lab Results  Component Value Date   CHOLHDL 2.6 11/14/2020   Lab Results  Component Value Date   HGBA1C 9.2 (H) 11/14/2020       Assessment & Plan:  1. Left buttock abscess Sent to ED for surgical evaluation.  Likely needs IV antibiotics. - cefTRIAXone (ROCEPHIN) injection 1 g  2. Hypertensive heart disease with heart failure ([place]) The current medical regimen is effective;  continue present plan and medications.  3. Mixed hyperlipidemia Pt needs to return for chronic visit.  - Lipid panel  4. Uncontrolled type 2 diabetes mellitus with hyperglycemia (HCC) - Hemoglobin A1c  5. Acquired hypothyroidism - TSH    Meds ordered this encounter  Medications  . cefTRIAXone (ROCEPHIN) injection 1 g    Orders Placed This Encounter  Procedures  . Hemoglobin A1c  . Lipid panel  . TSH  . Cardiovascular Risk Assessment    Follow-up: after hospitalization  An After Visit Summary was printed and given to the patient.  Rochel Brome, MD [facility] [phone]

## 2020-11-14 ENCOUNTER — Ambulatory Visit (INDEPENDENT_AMBULATORY_CARE_PROVIDER_SITE_OTHER): Payer: Medicare HMO | Admitting: Family Medicine

## 2020-11-14 ENCOUNTER — Ambulatory Visit: Payer: Medicare HMO | Admitting: Legal Medicine

## 2020-11-14 ENCOUNTER — Other Ambulatory Visit: Payer: Self-pay

## 2020-11-14 VITALS — BP 136/68 | HR 91 | Temp 97.4°F | Ht 67.0 in | Wt 250.0 lb

## 2020-11-14 DIAGNOSIS — I482 Chronic atrial fibrillation, unspecified: Secondary | ICD-10-CM | POA: Diagnosis not present

## 2020-11-14 DIAGNOSIS — E1165 Type 2 diabetes mellitus with hyperglycemia: Secondary | ICD-10-CM | POA: Diagnosis not present

## 2020-11-14 DIAGNOSIS — E119 Type 2 diabetes mellitus without complications: Secondary | ICD-10-CM | POA: Diagnosis not present

## 2020-11-14 DIAGNOSIS — E10628 Type 1 diabetes mellitus with other skin complications: Secondary | ICD-10-CM | POA: Diagnosis not present

## 2020-11-14 DIAGNOSIS — I110 Hypertensive heart disease with heart failure: Secondary | ICD-10-CM

## 2020-11-14 DIAGNOSIS — E039 Hypothyroidism, unspecified: Secondary | ICD-10-CM | POA: Diagnosis not present

## 2020-11-14 DIAGNOSIS — S31829A Unspecified open wound of left buttock, initial encounter: Secondary | ICD-10-CM | POA: Diagnosis not present

## 2020-11-14 DIAGNOSIS — N400 Enlarged prostate without lower urinary tract symptoms: Secondary | ICD-10-CM | POA: Diagnosis not present

## 2020-11-14 DIAGNOSIS — L0233 Carbuncle of buttock: Secondary | ICD-10-CM | POA: Diagnosis not present

## 2020-11-14 DIAGNOSIS — I130 Hypertensive heart and chronic kidney disease with heart failure and stage 1 through stage 4 chronic kidney disease, or unspecified chronic kidney disease: Secondary | ICD-10-CM | POA: Diagnosis not present

## 2020-11-14 DIAGNOSIS — I708 Atherosclerosis of other arteries: Secondary | ICD-10-CM | POA: Diagnosis not present

## 2020-11-14 DIAGNOSIS — N179 Acute kidney failure, unspecified: Secondary | ICD-10-CM | POA: Diagnosis not present

## 2020-11-14 DIAGNOSIS — E782 Mixed hyperlipidemia: Secondary | ICD-10-CM

## 2020-11-14 DIAGNOSIS — L0231 Cutaneous abscess of buttock: Secondary | ICD-10-CM | POA: Diagnosis not present

## 2020-11-14 DIAGNOSIS — N170 Acute kidney failure with tubular necrosis: Secondary | ICD-10-CM | POA: Diagnosis not present

## 2020-11-14 DIAGNOSIS — L03317 Cellulitis of buttock: Secondary | ICD-10-CM | POA: Diagnosis not present

## 2020-11-14 DIAGNOSIS — I878 Other specified disorders of veins: Secondary | ICD-10-CM | POA: Diagnosis not present

## 2020-11-14 DIAGNOSIS — J9611 Chronic respiratory failure with hypoxia: Secondary | ICD-10-CM | POA: Diagnosis not present

## 2020-11-14 DIAGNOSIS — N433 Hydrocele, unspecified: Secondary | ICD-10-CM | POA: Diagnosis not present

## 2020-11-14 DIAGNOSIS — I5032 Chronic diastolic (congestive) heart failure: Secondary | ICD-10-CM | POA: Diagnosis not present

## 2020-11-14 MED ORDER — CEFTRIAXONE SODIUM 1 G IJ SOLR
1.0000 g | Freq: Once | INTRAMUSCULAR | Status: AC
  Administered 2020-11-14: 1 g via INTRAMUSCULAR

## 2020-11-15 DIAGNOSIS — L03317 Cellulitis of buttock: Secondary | ICD-10-CM | POA: Diagnosis not present

## 2020-11-15 DIAGNOSIS — N170 Acute kidney failure with tubular necrosis: Secondary | ICD-10-CM | POA: Diagnosis not present

## 2020-11-15 DIAGNOSIS — L0233 Carbuncle of buttock: Secondary | ICD-10-CM | POA: Diagnosis not present

## 2020-11-15 LAB — LIPID PANEL
Chol/HDL Ratio: 2.6 ratio (ref 0.0–5.0)
Cholesterol, Total: 98 mg/dL — ABNORMAL LOW (ref 100–199)
HDL: 37 mg/dL — ABNORMAL LOW (ref >39)
LDL Chol Calc (NIH): 41 mg/dL (ref 0–99)
Triglycerides: 106 mg/dL (ref 0–149)
VLDL Cholesterol Cal: 20 mg/dL (ref 5–40)

## 2020-11-15 LAB — HEMOGLOBIN A1C
Est. average glucose Bld gHb Est-mCnc: 217 mg/dL
Hgb A1c MFr Bld: 9.2 % — ABNORMAL HIGH (ref 4.8–5.6)

## 2020-11-15 LAB — CARDIOVASCULAR RISK ASSESSMENT

## 2020-11-15 LAB — TSH: TSH: 1.03 u[IU]/mL (ref 0.450–4.500)

## 2020-11-16 DIAGNOSIS — L0233 Carbuncle of buttock: Secondary | ICD-10-CM | POA: Diagnosis not present

## 2020-11-16 DIAGNOSIS — L03317 Cellulitis of buttock: Secondary | ICD-10-CM | POA: Diagnosis not present

## 2020-11-16 DIAGNOSIS — N170 Acute kidney failure with tubular necrosis: Secondary | ICD-10-CM | POA: Diagnosis not present

## 2020-11-17 DIAGNOSIS — L03317 Cellulitis of buttock: Secondary | ICD-10-CM | POA: Diagnosis not present

## 2020-11-17 DIAGNOSIS — L0233 Carbuncle of buttock: Secondary | ICD-10-CM | POA: Diagnosis not present

## 2020-11-17 DIAGNOSIS — N170 Acute kidney failure with tubular necrosis: Secondary | ICD-10-CM | POA: Diagnosis not present

## 2020-11-18 ENCOUNTER — Encounter: Payer: Self-pay | Admitting: Family Medicine

## 2020-11-18 DIAGNOSIS — R110 Nausea: Secondary | ICD-10-CM | POA: Diagnosis not present

## 2020-11-18 DIAGNOSIS — E1065 Type 1 diabetes mellitus with hyperglycemia: Secondary | ICD-10-CM | POA: Diagnosis not present

## 2020-11-18 DIAGNOSIS — I129 Hypertensive chronic kidney disease with stage 1 through stage 4 chronic kidney disease, or unspecified chronic kidney disease: Secondary | ICD-10-CM | POA: Diagnosis not present

## 2020-11-18 DIAGNOSIS — R52 Pain, unspecified: Secondary | ICD-10-CM | POA: Diagnosis not present

## 2020-11-18 DIAGNOSIS — E1022 Type 1 diabetes mellitus with diabetic chronic kidney disease: Secondary | ICD-10-CM | POA: Diagnosis not present

## 2020-11-18 DIAGNOSIS — N189 Chronic kidney disease, unspecified: Secondary | ICD-10-CM | POA: Diagnosis not present

## 2020-11-18 DIAGNOSIS — Z743 Need for continuous supervision: Secondary | ICD-10-CM | POA: Diagnosis not present

## 2020-11-18 DIAGNOSIS — L03115 Cellulitis of right lower limb: Secondary | ICD-10-CM | POA: Diagnosis not present

## 2020-11-18 DIAGNOSIS — R112 Nausea with vomiting, unspecified: Secondary | ICD-10-CM | POA: Diagnosis not present

## 2020-11-18 DIAGNOSIS — I10 Essential (primary) hypertension: Secondary | ICD-10-CM | POA: Diagnosis not present

## 2020-11-18 DIAGNOSIS — Z794 Long term (current) use of insulin: Secondary | ICD-10-CM | POA: Diagnosis not present

## 2020-11-18 DIAGNOSIS — N183 Chronic kidney disease, stage 3 unspecified: Secondary | ICD-10-CM | POA: Diagnosis not present

## 2020-11-18 DIAGNOSIS — L03317 Cellulitis of buttock: Secondary | ICD-10-CM | POA: Diagnosis not present

## 2020-11-18 DIAGNOSIS — E1165 Type 2 diabetes mellitus with hyperglycemia: Secondary | ICD-10-CM | POA: Diagnosis not present

## 2020-11-18 DIAGNOSIS — Z6841 Body Mass Index (BMI) 40.0 and over, adult: Secondary | ICD-10-CM | POA: Diagnosis not present

## 2020-11-18 DIAGNOSIS — I2510 Atherosclerotic heart disease of native coronary artery without angina pectoris: Secondary | ICD-10-CM | POA: Diagnosis not present

## 2020-11-19 ENCOUNTER — Telehealth: Payer: Self-pay

## 2020-11-19 DIAGNOSIS — L03317 Cellulitis of buttock: Secondary | ICD-10-CM | POA: Diagnosis not present

## 2020-11-19 DIAGNOSIS — I5032 Chronic diastolic (congestive) heart failure: Secondary | ICD-10-CM | POA: Diagnosis not present

## 2020-11-19 DIAGNOSIS — J9611 Chronic respiratory failure with hypoxia: Secondary | ICD-10-CM | POA: Diagnosis not present

## 2020-11-19 DIAGNOSIS — E1122 Type 2 diabetes mellitus with diabetic chronic kidney disease: Secondary | ICD-10-CM | POA: Diagnosis not present

## 2020-11-19 DIAGNOSIS — I130 Hypertensive heart and chronic kidney disease with heart failure and stage 1 through stage 4 chronic kidney disease, or unspecified chronic kidney disease: Secondary | ICD-10-CM | POA: Diagnosis not present

## 2020-11-19 DIAGNOSIS — J449 Chronic obstructive pulmonary disease, unspecified: Secondary | ICD-10-CM | POA: Diagnosis not present

## 2020-11-19 DIAGNOSIS — I482 Chronic atrial fibrillation, unspecified: Secondary | ICD-10-CM | POA: Diagnosis not present

## 2020-11-19 DIAGNOSIS — L98419 Non-pressure chronic ulcer of buttock with unspecified severity: Secondary | ICD-10-CM | POA: Diagnosis not present

## 2020-11-19 DIAGNOSIS — N1832 Chronic kidney disease, stage 3b: Secondary | ICD-10-CM | POA: Diagnosis not present

## 2020-11-19 DIAGNOSIS — E876 Hypokalemia: Secondary | ICD-10-CM | POA: Diagnosis not present

## 2020-11-19 NOTE — Telephone Encounter (Signed)
error 

## 2020-11-19 NOTE — Telephone Encounter (Signed)
Jon [name] from [facility] called to request approval for POC for Jon [name].  Approval was given.

## 2020-11-19 NOTE — Telephone Encounter (Signed)
  Transition Care Management Follow-up Telephone Call    Jon [name] 16-May-1951  Admit Date: 11/14/20 Discharge Date: 11/17/20 Discharged from where: [facility]  Diagnoses: Cellulitis of buttock (L03.90), Wound of buttock, left (S31.809A)  2 day post discharge: 11/19/20 7 day post discharge: 11/24/20 14 day post discharge: 12/01/20  Jon [name] was discharged from [facility] on 11/17/20 with the diagnoses listed above.  He returned to the ED the following day, 11/18/20 and was released to home.  He was contacted today via telephone in regards to transition of care.  I spoke with Jon [name] briefly then he put his step daughter, Jon [name], on the phone.  Jon [name] picked up his ABT today.  She states that there still is some confusion.  - Patient presented to ED on 3/16 with complaints of pain, swelling, and ulceration of left buttock x 2 weeks. CT negative for abscess, suggesting cellulitis.  Treated in ED with IV Vanc, IV Cefepime, and 1L NS. - Significant labs in ED: WBC 12,800, Serum Sodium 134, chloride 91, bicarb 32, potassium 3.3, Creatinine 2.50 BUN 71 - With treatment labs have improved - Jon [name] was discharged with a prescription for Clindamycin 300 mg PO Q6H x 7 days along with a probiotic. - He was given supplies for dressing changes x one week - Patient was arranged with a home health nurse at time of discharge - Patient returned to ED on 3/20 with complaints of N/V and general weakness.  He was reported to be confused at that time.  He states that he did not pick up his medication that he was discharged with.  WBC improved to 12.6. - Patient needs to see Nephrologist, this was recommended during a previous admission however he never set up the appointment.  He was given a dose of the ABT while in the ED and given a script to be filled until he could fill his on Monday  Discharge Instructions: Complete ABT, Change wound dressing daily, schedule appointment with Nephrologist    Items Reviewed:  Did the pt receive and understand the discharge instructions provided? Yes   Medications obtained and verified? Yes   Other? No   Any new allergies since your discharge? No   Dietary orders reviewed? Yes  Do you have support at home? No   Home Care and Equipment/Supplies: Were home health services ordered? yes Has the agency set up a time to come to the patient's home? yes Were any new equipment or medical supplies ordered?  No  Functional Questionnaire: (I = Independent and D = Dependent) ADLs: I  Bathing/Dressing- I  Meal Prep- D  Eating- I  Maintaining continence- I  Transferring/Ambulation- with assist  Managing Meds- I   Any patient concerns? no  Follow up appointments reviewed:  PCP Hospital f/u appt confirmed? No  Our office will call Jon [name] back with appointment date/time today for one week follow-up  [facility] f/u appt confirmed? No  Needs to be scheduled for kidney consult  Are transportation arrangements needed? No   If their condition worsens, is the pt aware to call PCP or go to the Emergency Dept.? Yes  Was the patient provided with contact information for the PCP's office/after hours number? Yes  Was to pt encouraged to call back with questions or concerns? Yes    Jon Iron, LPN 43/15/40 0:86 AM

## 2020-11-19 NOTE — Telephone Encounter (Signed)
I called Jon [name] back about the appointment with the kidney specialist--A referral had been placed by Dr Cox in the past, [facility] stated at that time we didn't have to make a referral because the patient was already established with them.   I relayed this information to Jon [name] and gave her the phone number to [facility] so she can call to schedule.

## 2020-11-22 DIAGNOSIS — J9610 Chronic respiratory failure, unspecified whether with hypoxia or hypercapnia: Secondary | ICD-10-CM | POA: Diagnosis not present

## 2020-11-23 DIAGNOSIS — J9611 Chronic respiratory failure with hypoxia: Secondary | ICD-10-CM | POA: Diagnosis not present

## 2020-11-23 DIAGNOSIS — I482 Chronic atrial fibrillation, unspecified: Secondary | ICD-10-CM | POA: Diagnosis not present

## 2020-11-23 DIAGNOSIS — E876 Hypokalemia: Secondary | ICD-10-CM | POA: Diagnosis not present

## 2020-11-23 DIAGNOSIS — J449 Chronic obstructive pulmonary disease, unspecified: Secondary | ICD-10-CM | POA: Diagnosis not present

## 2020-11-23 DIAGNOSIS — I130 Hypertensive heart and chronic kidney disease with heart failure and stage 1 through stage 4 chronic kidney disease, or unspecified chronic kidney disease: Secondary | ICD-10-CM | POA: Diagnosis not present

## 2020-11-23 DIAGNOSIS — L98419 Non-pressure chronic ulcer of buttock with unspecified severity: Secondary | ICD-10-CM | POA: Diagnosis not present

## 2020-11-23 DIAGNOSIS — L03317 Cellulitis of buttock: Secondary | ICD-10-CM | POA: Diagnosis not present

## 2020-11-23 DIAGNOSIS — E1122 Type 2 diabetes mellitus with diabetic chronic kidney disease: Secondary | ICD-10-CM | POA: Diagnosis not present

## 2020-11-23 DIAGNOSIS — N1832 Chronic kidney disease, stage 3b: Secondary | ICD-10-CM | POA: Diagnosis not present

## 2020-11-23 DIAGNOSIS — I5032 Chronic diastolic (congestive) heart failure: Secondary | ICD-10-CM | POA: Diagnosis not present

## 2020-11-24 DIAGNOSIS — J9610 Chronic respiratory failure, unspecified whether with hypoxia or hypercapnia: Secondary | ICD-10-CM | POA: Diagnosis not present

## 2020-11-26 ENCOUNTER — Other Ambulatory Visit: Payer: Self-pay | Admitting: Family Medicine

## 2020-11-26 DIAGNOSIS — L98419 Non-pressure chronic ulcer of buttock with unspecified severity: Secondary | ICD-10-CM | POA: Diagnosis not present

## 2020-11-26 DIAGNOSIS — L03317 Cellulitis of buttock: Secondary | ICD-10-CM | POA: Diagnosis not present

## 2020-11-26 DIAGNOSIS — I130 Hypertensive heart and chronic kidney disease with heart failure and stage 1 through stage 4 chronic kidney disease, or unspecified chronic kidney disease: Secondary | ICD-10-CM | POA: Diagnosis not present

## 2020-11-27 ENCOUNTER — Other Ambulatory Visit: Payer: Self-pay | Admitting: Legal Medicine

## 2020-11-29 ENCOUNTER — Telehealth: Payer: Self-pay

## 2020-11-29 DIAGNOSIS — G4733 Obstructive sleep apnea (adult) (pediatric): Secondary | ICD-10-CM | POA: Diagnosis not present

## 2020-11-29 DIAGNOSIS — L03317 Cellulitis of buttock: Secondary | ICD-10-CM | POA: Diagnosis not present

## 2020-11-29 DIAGNOSIS — I130 Hypertensive heart and chronic kidney disease with heart failure and stage 1 through stage 4 chronic kidney disease, or unspecified chronic kidney disease: Secondary | ICD-10-CM | POA: Diagnosis not present

## 2020-11-29 DIAGNOSIS — J9611 Chronic respiratory failure with hypoxia: Secondary | ICD-10-CM | POA: Diagnosis not present

## 2020-11-29 DIAGNOSIS — I482 Chronic atrial fibrillation, unspecified: Secondary | ICD-10-CM | POA: Diagnosis not present

## 2020-11-29 DIAGNOSIS — N1832 Chronic kidney disease, stage 3b: Secondary | ICD-10-CM | POA: Diagnosis not present

## 2020-11-29 DIAGNOSIS — E876 Hypokalemia: Secondary | ICD-10-CM | POA: Diagnosis not present

## 2020-11-29 DIAGNOSIS — I5032 Chronic diastolic (congestive) heart failure: Secondary | ICD-10-CM | POA: Diagnosis not present

## 2020-11-29 DIAGNOSIS — J449 Chronic obstructive pulmonary disease, unspecified: Secondary | ICD-10-CM | POA: Diagnosis not present

## 2020-11-29 DIAGNOSIS — E1122 Type 2 diabetes mellitus with diabetic chronic kidney disease: Secondary | ICD-10-CM | POA: Diagnosis not present

## 2020-11-29 DIAGNOSIS — L98419 Non-pressure chronic ulcer of buttock with unspecified severity: Secondary | ICD-10-CM | POA: Diagnosis not present

## 2020-11-29 NOTE — Telephone Encounter (Signed)
PA submitted and approved for Humalog kwikpen via covermymeds.

## 2020-11-29 NOTE — Progress Notes (Signed)
Cancelled. kc 

## 2020-11-30 ENCOUNTER — Telehealth: Payer: Self-pay

## 2020-11-30 ENCOUNTER — Ambulatory Visit (INDEPENDENT_AMBULATORY_CARE_PROVIDER_SITE_OTHER): Payer: Medicare Other | Admitting: Family Medicine

## 2020-11-30 DIAGNOSIS — L0231 Cutaneous abscess of buttock: Secondary | ICD-10-CM

## 2020-11-30 NOTE — Telephone Encounter (Signed)
Morey [facility]ming[facility] called to report that [name] insurance is changing and she will close his case out this week and reopen under the new insurance next week.  She also reports that she is concerned that Jon [name] and his family have not been compliant with wound care, blood sugar checks and weight.  She reinforced instructions to patient and his daughter yesterday.

## 2020-11-30 NOTE — Telephone Encounter (Signed)
[place] w/ [place] left VM requesting verbal orders to readmit pt for weekly wound care. Pt has changed insurances [employer] to [employer] therefore services have to be discharged and readmitted.   Nurse also wanted dr aware of noncompliance. Nurse stated when seeing pt yesterday wound care had not been done in a week. It had been documented that the pt was educated on daily wound care two weeks prior. Nurse made pt and daughter aware for services to continue they would have to actively participate in [place] as in; daily weights, checking BS daily, and daily wound care. [name] his daughter VU to nurse.   Returned call to nurse, gave verbal orders to readmit pt for weekly wound care.   Royce Macadamia, [place] 11/30/20 8:30 AM

## 2020-12-03 ENCOUNTER — Ambulatory Visit (INDEPENDENT_AMBULATORY_CARE_PROVIDER_SITE_OTHER): Payer: Medicare Other | Admitting: Family Medicine

## 2020-12-03 ENCOUNTER — Other Ambulatory Visit: Payer: Self-pay

## 2020-12-03 VITALS — BP 124/62 | HR 79 | Temp 97.0°F | Ht 67.0 in | Wt 266.0 lb

## 2020-12-03 DIAGNOSIS — L0231 Cutaneous abscess of buttock: Secondary | ICD-10-CM | POA: Diagnosis not present

## 2020-12-03 DIAGNOSIS — Z794 Long term (current) use of insulin: Secondary | ICD-10-CM

## 2020-12-03 DIAGNOSIS — E1140 Type 2 diabetes mellitus with diabetic neuropathy, unspecified: Secondary | ICD-10-CM | POA: Diagnosis not present

## 2020-12-03 DIAGNOSIS — L299 Pruritus, unspecified: Secondary | ICD-10-CM

## 2020-12-03 DIAGNOSIS — N184 Chronic kidney disease, stage 4 (severe): Secondary | ICD-10-CM

## 2020-12-03 DIAGNOSIS — G894 Chronic pain syndrome: Secondary | ICD-10-CM | POA: Diagnosis not present

## 2020-12-03 DIAGNOSIS — I129 Hypertensive chronic kidney disease with stage 1 through stage 4 chronic kidney disease, or unspecified chronic kidney disease: Secondary | ICD-10-CM | POA: Diagnosis not present

## 2020-12-03 DIAGNOSIS — I110 Hypertensive heart disease with heart failure: Secondary | ICD-10-CM | POA: Diagnosis not present

## 2020-12-03 MED ORDER — HYDROXYZINE HCL 50 MG PO TABS: 50.0000 mg | ORAL_TABLET | Freq: Three times a day (TID) | ORAL | 3 refills | Status: DC | PRN

## 2020-12-03 MED ORDER — XTAMPZA ER 27 MG PO C12A: 1.0000 | EXTENDED_RELEASE_CAPSULE | Freq: Two times a day (BID) | ORAL | 0 refills | Status: DC

## 2020-12-03 NOTE — Progress Notes (Signed)
Subjective:  Patient ID: Jon [name], male    DOB: [birth year]  Age: 70 y.o. MRN: [id number]  Chief Complaint  Patient presents with  . Hospitalization Follow-up    HPI  Presents for hospital follow-up.  He was admitted from March 16 to November 17, 2020 at [facility] for cellulitis of the left buttock.  I sent him from our office to the emergency department Transition of care was done by our nurse and the patient did not show up for his appointment last week.  I did receive a phone call from his home health care nurse who was concerned because he is very noncompliant with recommendations.   He did return to the emergency department on November 18, 2020 but was released back home. Work-up included:  CT negative for abscess, suggesting cellulitis.   Treated in ED with IV Vanc, IV Cefepime and IV fluids.  His white blood cell count was elevated 12.8.  His creatinine was elevated to 2.5.  In the past he has seen [facility] however due to problems with transportation after his Jon [name] was discharged with clindamycin 300 mg 4 times daily for 7 days along with a probiotic being recommended.   Home health nurse was arranged at the time of discharge for wound dressings.  His cellulitis/abscess has improved. When he returned to the emergency department on March 20 it was for nausea, vomiting, generalized weakness, and confusion.  She had not picked up his antibiotic from the pharmacy and was instructed to do so discharge back home after lab work and evaluation.  Patient is very irritable today.  He is complaining of having issues with his kidneys.  He is complaining of difficulty urinating.  He is desire not to go on dialysis.  His greatest complaint today is severe itching everywhere. No rash is present. He has been out of his xtampza for one week.   Current Outpatient Medications on File Prior to Visit  Medication Sig Dispense Refill  . albuterol (PROVENTIL) (2.5 MG/3ML) 0.083% nebulizer  solution Take 3 mLs (2.5 mg total) by nebulization 2 (two) times daily as needed for wheezing. 200 mL 1  . atorvastatin (LIPITOR) 40 MG tablet TAKE 1 TABLET BY MOUTH ONCE DAILY 90 tablet 1  . Azelastine HCl 0.15 % SOLN USE 1 SPRAY IN EACH NOSTRIL TWICE DAILY 30 mL 11  . BD PEN NEEDLE NANO 2ND GEN 32G X 4 MM MISC USE DAILY WITH BASAGLAR 100 each 3  . clindamycin (CLEOCIN) 300 MG capsule Take 300 mg by mouth every 6 (six) hours.    . Continuous Blood Gluc Receiver (DEXCOM G6 RECEIVER) DEVI 1 each by Does not apply route 4 (four) times daily -  before meals and at bedtime. (Patient not taking: Reported on 10/18/2020) 1 each 0  . Continuous Blood Gluc Sensor (DEXCOM G6 SENSOR) MISC Apply every 10 days (Patient not taking: Reported on 10/18/2020) 3 each 3  . diltiazem (CARDIZEM CD) 120 MG 24 hr capsule TAKE 1 CAPSULE BY MOUTH DAILY 90 capsule 1  . docusate sodium (COLACE) 100 MG capsule Take 100 mg by mouth 2 (two) times daily.    . ferrous sulfate 325 (65 FE) MG tablet Take 325 mg by mouth daily.    . Glucagon, rDNA, (GLUCAGON EMERGENCY) 1 MG KIT Inject 1 mg as directed as needed (low blood sugar).     Marland Kitchen HUMALOG KWIKPEN 100 UNIT/ML KwikPen PER SLIDING SCALE ADMINSTER AS FOLLOWS: >150-200 2 U,201-250 4 U,251-300 8 U, 301-350  12 U,351-400 16 U,>400 20 U 15 mL 1  . hydrALAZINE (APRESOLINE) 100 MG tablet TAKE ONE (1) TABLET BY MOUTH 3 TIMES DAILY. DISCONTINE DILTIAZEM. 90 tablet 2  . insulin detemir (LEVEMIR FLEXTOUCH) 100 UNIT/ML FlexPen Inject 30 Units into the skin daily. (Patient taking differently: Inject 26 Units into the skin daily.) 15 mL   . Insulin Regular Human (NOVOLIN R FLEXPEN) 100 UNIT/ML SOPN PER SLIDING SCALE ADMINSTER AS FOLLOWS: >150-200 2 U,201-250 4 U,251-300 8 U, 301-350 12 U,351-400 16 U,>400 20 U before each meal. (Patient not taking: Reported on 10/18/2020) 54 mL 1  . isosorbide mononitrate (IMDUR) 120 MG 24 hr tablet TAKE 1 TABLET BY MOUTH ONCE DAILY IN THEMORNINGS 90 tablet 1  .  lactulose (CHRONULAC) 10 GM/15ML solution TAKE 30MLS BY MOUTH IN THE MORNING AND AT BEDTIME 946 mL 2  . levocetirizine (XYZAL) 5 MG tablet TAKE 1 TABLET BY MOUTH EVERY EVENING 90 tablet 1  . metolazone (ZAROXOLYN) 2.5 MG tablet TAKE 1 TABLET BY MOUTH DAILY 3 HOURS PRIOR TO LASIX DAILY 36 tablet 5  . metoprolol succinate (TOPROL-XL) 50 MG 24 hr tablet TAKE 1 TABLET BY MOUTH ONCE DAILY 90 tablet 1  . mometasone-formoterol (DULERA) 200-5 MCG/ACT AERO Inhale 2 puffs into the lungs 2 (two) times daily as needed for wheezing.    Marland Kitchen MOVANTIK 25 MG TABS tablet TAKE 1 TABLET BY MOUTH DAILY 30 tablet 11  . nitroGLYCERIN (NITROSTAT) 0.4 MG SL tablet Place 0.4 mg under the tongue every 5 (five) minutes as needed for chest pain.     . OXYGEN Inhale into the lungs. 2 liters at bedtime    . OZEMPIC, 1 MG/DOSE, 4 MG/3ML SOPN INJECT 1MG INTO THE SKIN ONCE A WEEK 3 mL 2  . pantoprazole (PROTONIX) 40 MG tablet Take 40 mg by mouth in the morning.     . potassium chloride SA (KLOR-CON) 20 MEQ tablet TAKE TWO TABLETS BY MOUTH TWICE A DAY 360 tablet 0  . pregabalin (LYRICA) 75 MG capsule TAKE 1 CAPSULE BY MOUTH TWICE DAILY 180 capsule 2  . sertraline (ZOLOFT) 50 MG tablet TAKE 1 TABLET BY MOUTH DAILY 90 tablet 1  . tamsulosin (FLOMAX) 0.4 MG CAPS capsule TAKE 2 CAPSULES BY MOUTH DAILY AFTER SUPPER 60 capsule 1  . torsemide (DEMADEX) 20 MG tablet TAKE 3 TABLETS BY MOUTH AT LUNCH AND TAKE 3 TABLETS AT SUPPER 540 tablet 1  . Vitamin D, Ergocalciferol, (DRISDOL) 1.25 MG (50000 UNIT) CAPS capsule TAKE 1 CAPSULE BY MOUTH DAILY 90 capsule 1   No current facility-administered medications on file prior to visit.   Past Medical History:  Diagnosis Date  . Abnormal EKG 11/29/2014  . Acute renal insufficiency 11/29/2014  . Acute venous embolism and thrombosis of deep vessels of distal lower extremity ([place]) 11/13/2019   Need to check venous dopplers for possible DVT due to tissure injury  . Angina   . Arthritis    "knees"  (01/18/2014)  . Asthma   . Atrial fibrillation ([place])   . Back pain 01/25/2015  . 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 BOOP (bronchiolitis obliterans with organizing pneumonia) ([place]) 01/20/2014   OLBx 01/20/14 BOOP Started steroids 02/28/14   . CHF (congestive heart failure) ([place])   . Chronic anticoagulation-Xarelto 02/09/2014  . Chronic atrial fibrillation ([place]) 01/03/2014  . Chronic bronchitis ([place]) 12/21/2013  . Chronic diastolic heart failure ([place]) 02/22/2015  . Chronic pain of both knees 03/18/2017   Overview:  Added automatically from request for surgery  [id number]  . Chronic respiratory failure ([place]) 04/10/2015  . Chronic respiratory failure with hypoxia ([place]) 02/11/2020  . COPD (chronic obstructive pulmonary disease) ([place]) 01/25/2015  . Cough    coughing up blood- started last nite, seems to be worse now  . CVA (cerebral vascular accident) ([place]) 02/09/2014  . Diabetic polyneuropathy associated with type 2 diabetes mellitus ([place]) 12/21/2013  . Diabetic ulcer of toe of right foot associated with type 2 diabetes mellitus, with necrosis of bone ([place])   . DM (diabetes mellitus), type 2 with renal complications ([place]) 09/01/5724  . Drug induced constipation 11/10/2017  . Dysrhythmia    atrial fib takes cardizem,   . Fibromyalgia   . GERD (gastroesophageal reflux disease)   . H/O hiatal hernia   . Hemoptysis 08/01/2015  . Herpes zoster 06/26/2015  . History of stroke   . Hyperkalemia-repeat pending 11/29/2014  . Hyperlipemia   . Hyperlipidemia   . Hypertension   . Hypertensive heart disease with heart failure ([place])   . Hypothyroidism   . LBBB (left bundle branch block) 02/22/2015  . Long term (current) use of anticoagulants 03/21/2014  . Mild CAD 02/20/2015   Overview:  On recent cardiac cath  Overview:  On recent cardiac cath  . Myocardial infarction [facility])    "one dr says yes; another says no"  . Neuromuscular disorder (HCC)    rls, neuropathy  . Neuropathy    rls, neuropathy   . Normal coronary  arteries 2011 11/29/2014  . Obesity (BMI 30-39.9)   . On home oxygen therapy    "2L w/CPAP at bedtime" (09/01/2018)  . OSA on CPAP    cpap & oxygen  . Pericardial effusion   . Peripheral vascular disease ([place])   . Peritoneal effusion, chronic 02/22/2015   Overview:  With surgical window  . PNA (pneumonia) 04/10/2015  . Pneumonia 5/15   "several times" (01/18/2014)  . Precordial pain 01/26/2015  . Preoperative cardiovascular examination 02/24/2016  . Primary osteoarthritis of both knees 03/18/2017   Overview:  Added automatically from request for surgery [id number]  . PVD (peripheral vascular disease) ([place]) 09/28/2018  . Restless leg syndrome   . Sleep apnea    cpap & oxygen   . Stroke [facility]) 2012   denies residual on 01/18/2014  . Troponin level elevated 11/29/2014  . Uncontrolled type 2 diabetes mellitus with diabetic polyneuropathy, with long-term current use of insulin ([place]) 12/21/2013   type ?    Past Surgical History:  Procedure Laterality Date  . ABDOMINAL AORTOGRAM W/LOWER EXTREMITY N/A 09/08/2018   Procedure: ABDOMINAL AORTOGRAM W/LOWER EXTREMITY;  Surgeon: Marty Heck, MD;  Location: [place] [facility];  Service: Cardiovascular;  Laterality: N/A;  . AMPUTATION Right 09/09/2018   Procedure: AMPUTATION RIGHT GREAT TOE;  Surgeon: Waynetta Sandy, MD;  Location: [place];  Service: Vascular;  Laterality: Right;  . AMPUTATION TOE Right 05/04/2020   Procedure: AMPUTATION TOE RIGHT 2nd TOE;  Surgeon: Evelina Bucy, DPM;  Location: [facility];  Service: Podiatry;  Laterality: Right;  . APPENDECTOMY    . CARDIAC CATHETERIZATION  X 2 then 03/03/2012    NL LVF, normal coronaries, vessels are small ([facility]: Dr. Beatrix Fetters)  . CATARACT EXTRACTION W/ INTRAOCULAR LENS  IMPLANT, BILATERAL Bilateral   . CHOLECYSTECTOMY    . HERNIA REPAIR     UHR  . LAPAROSCOPIC GASTRIC BANDING  2010  . LOWER EXTREMITY ANGIOGRAPHY Left 10/11/2018   Procedure: LOWER EXTREMITY ANGIOGRAPHY;  Surgeon: Waynetta Sandy, MD;  Location: [place] [facility];  Service: Cardiovascular;  Laterality: Left;  . PERICARDIAL WINDOW Left 01/24/2014   Procedure: PERICARDIAL WINDOW;  Surgeon: Gaye Pollack, MD;  Location: [place];  Service: Thoracic;  Laterality: Left;  . PERIPHERAL VASCULAR INTERVENTION Right 09/08/2018   Procedure: PERIPHERAL VASCULAR INTERVENTION;  Surgeon: Marty Heck, MD;  Location: [place] [facility];  Service: Cardiovascular;  Laterality: Right;  . SINUS EXPLORATION  X 2  . TEE WITHOUT CARDIOVERSION N/A 09/07/2018   Procedure: TRANSESOPHAGEAL ECHOCARDIOGRAM (TEE);  Surgeon: Acie Fredrickson Wonda Cheng, MD;  Location: [place];  Service: Cardiovascular;  Laterality: N/A;  . UMBILICAL HERNIA REPAIR    . VIDEO ASSISTED THORACOSCOPY Left 01/24/2014   Procedure: VIDEO ASSISTED THORACOSCOPY;  Surgeon: Gaye Pollack, MD;  Location: [facility] OR;  Service: Thoracic;  Laterality: Left;  VATS/open lung biopsy  . VIDEO BRONCHOSCOPY Bilateral 12/29/2013   Procedure: VIDEO BRONCHOSCOPY WITHOUT FLUORO;  Surgeon: Elsie Stain, MD;  [facility];  Service: Endoscopy;  Laterality: Bilateral;  . VIDEO BRONCHOSCOPY N/A 01/24/2014   Procedure: VIDEO BRONCHOSCOPY;  Surgeon: Gaye Pollack, MD;  Location: [facility] OR;  Service: Thoracic;  Laterality: N/A;    Family History  Problem Relation Age of Onset  . Cancer Mother   . Stroke Father   . Heart disease Father   . Seizures Sister   . Diabetes Sister   . Anesthesia problems Neg Hx   . Hypotension Neg Hx   . Malignant hyperthermia Neg Hx   . Pseudochol deficiency Neg Hx    Social History   Socioeconomic History  . Marital status: Widowed    Spouse name: Not on file  . Number of children: 3  . Years of education: Not on file  . Highest education level: Not on file  Occupational History  . Not on file  Tobacco Use  . Smoking status: Former Smoker    Types: Cigars    Quit date: 09/01/2006    Years since quitting: 14.3  . Smokeless tobacco: Never  Used  Vaping Use  . Vaping Use: Never used  Substance and Sexual Activity  . 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 Drug use: No  . Sexual activity: Never  Other Topics Concern  . Not on file  Social History Narrative  . Not on file   Social Determinants of Health   Financial Resource Strain: Not on file  Food Insecurity: Not on file  Transportation Needs: No Transportation Needs  . Lack of Transportation (Medical): No  . Lack of Transportation (Non-Medical): No  Physical Activity: Inactive  . Days of Exercise per Week: 0 days  . Minutes of Exercise per Session: 0 min  Stress: Not on file  Social Connections: Not on file    Review of Systems  Constitutional: Negative for chills, diaphoresis, fatigue and fever.  HENT: Negative for congestion, ear pain and sore throat.   Respiratory: Negative for cough and shortness of breath.   Cardiovascular: Negative for chest pain and leg swelling.  Gastrointestinal: Negative for abdominal pain, constipation, diarrhea, nausea and vomiting.  Genitourinary: Positive for difficulty urinating and dysuria. Negative for urgency.     Objective:  BP 124/62   Pulse 79   Temp (!) 97 F (36.1 C)   Ht _0  (1.702 m)   Wt 266 lb (120.7 kg)   SpO2 97%   BMI 41.66 kg/m   BP/Weight 12/03/2020 11/14/2020 38/18/2993  Systolic  BP 366 294 765  Diastolic BP 62 68 68  Wt. (Lbs) 266 250 253  BMI 41.66 39.16 39.63    Physical Exam Vitals reviewed.  Constitutional:      Appearance: He is obese.  Cardiovascular:     Rate and Rhythm: Normal rate. Rhythm irregular.     Heart sounds: Normal heart sounds.  Pulmonary:     Effort: Pulmonary effort is normal.     Breath sounds: Normal breath sounds.  Skin:    Findings: No rash.     Comments: Left buttock: some drainage. Induration significantly improved. Still tender.   Neurological:     Mental Status: He is alert.     Diabetic Foot Exam - Simple   No data  filed      Lab Results  Component Value Date   WBC 9.1 12/03/2020   HGB 14.6 12/03/2020   HCT 42.8 12/03/2020   PLT 272 12/03/2020   GLUCOSE 158 (H) 12/12/2020   CHOL 98 (L) 11/14/2020   TRIG 106 11/14/2020   HDL 37 (L) 11/14/2020   LDLCALC 41 11/14/2020   ALT 19 12/12/2020   AST 19 12/12/2020   NA 144 12/12/2020   K 3.2 (L) 12/12/2020   CL 97 12/12/2020   CREATININE 2.46 (H) 12/12/2020   BUN 40 (H) 12/12/2020   CO2 25 12/12/2020   TSH 1.030 11/14/2020   INR 1.68 09/01/2018   HGBA1C 9.2 (H) 11/14/2020      Assessment & Plan:   1. Pruritus - Ammonia Start on atarax one three times a day. Stop xyzal. This could be secondary to opioid withdrawal.  2. Hypertensive heart disease with heart failure ([place]) The current medical regimen is effective;  continue present plan and medications. - CBC with Differential/Platelet - Comprehensive metabolic panel  3. Type 2 diabetes mellitus with diabetic neuropathy, with long-term current use of insulin (HCC) The current medical regimen is effective;  continue present plan and medications. Not at goal, but compliance is a major issue.   4. CKD stage 4 secondary to hypertension (HCC) - PTH, Intact and Calcium Refer back to nephrology.   5. Left buttock abscess Recommend cooperate with home health care nursing for dressing changes. Improved.   6. Chronic knee/back pain.  Refill of xtampza sent.    Meds ordered this encounter  Medications  . DISCONTD: XTAMPZA ER 27 MG C12A    Sig: Take 1 capsule by mouth 2 (two) times daily.    Dispense:  60 capsule    Refill:  0  . DISCONTD: XTAMPZA ER 27 MG C12A    Sig: Take 1 capsule by mouth 2 (two) times daily.    Dispense:  60 capsule    Refill:  0  . XTAMPZA ER 27 MG C12A    Sig: Take 1 capsule by mouth 2 (two) times daily.    Dispense:  60 capsule    Refill:  0  . hydrOXYzine (ATARAX/VISTARIL) 50 MG tablet    Sig: Take 1 tablet (50 mg total) by mouth 3 (three) times daily as  needed.    Dispense:  90 tablet    Refill:  3    Orders Placed This Encounter  Procedures  . CBC with Differential/Platelet  . Comprehensive metabolic panel  . Ammonia  . PTH, Intact and Calcium     Follow-up: Return in about 12 weeks (around 02/25/2021) for fasting.  An After Visit Summary was printed and given to the patient.  Rochel Brome, MD [facility] [phone]

## 2020-12-03 NOTE — Patient Instructions (Addendum)
Start on atarax one three times a day. Stop xyzal. Refill of xtampza sent.  Refer back to nephrology.

## 2020-12-04 ENCOUNTER — Encounter: Payer: Self-pay | Admitting: Legal Medicine

## 2020-12-04 DIAGNOSIS — N2581 Secondary hyperparathyroidism of renal origin: Secondary | ICD-10-CM

## 2020-12-04 HISTORY — DX: Secondary hyperparathyroidism of renal origin: N25.81

## 2020-12-04 LAB — CBC WITH DIFFERENTIAL/PLATELET
Basophils Absolute: 0.1 10*3/uL (ref 0.0–0.2)
Basos: 1 %
EOS (ABSOLUTE): 0 10*3/uL (ref 0.0–0.4)
Eos: 0 %
Hematocrit: 42.8 % (ref 37.5–51.0)
Hemoglobin: 14.6 g/dL (ref 13.0–17.7)
Immature Grans (Abs): 0 10*3/uL (ref 0.0–0.1)
Immature Granulocytes: 0 %
Lymphocytes Absolute: 1.8 10*3/uL (ref 0.7–3.1)
Lymphs: 20 %
MCH: 28.3 pg (ref 26.6–33.0)
MCHC: 34.1 g/dL (ref 31.5–35.7)
MCV: 83 fL (ref 79–97)
Monocytes Absolute: 0.6 10*3/uL (ref 0.1–0.9)
Monocytes: 7 %
Neutrophils Absolute: 6.6 10*3/uL (ref 1.4–7.0)
Neutrophils: 72 %
Platelets: 272 10*3/uL (ref 150–450)
RBC: 5.15 x10E6/uL (ref 4.14–5.80)
RDW: 13.4 % (ref 11.6–15.4)
WBC: 9.1 10*3/uL (ref 3.4–10.8)

## 2020-12-04 LAB — COMPREHENSIVE METABOLIC PANEL
ALT: 22 IU/L (ref 0–44)
AST: 17 IU/L (ref 0–40)
Albumin/Globulin Ratio: 1.4 (ref 1.2–2.2)
Albumin: 3.7 g/dL (ref 3.7–4.7)
Alkaline Phosphatase: 119 IU/L (ref 44–121)
BUN/Creatinine Ratio: 21 (ref 10–24)
BUN: 51 mg/dL — ABNORMAL HIGH (ref 8–27)
Bilirubin Total: 0.4 mg/dL (ref 0.0–1.2)
CO2: 22 mmol/L (ref 20–29)
Calcium: 8.1 mg/dL — ABNORMAL LOW (ref 8.6–10.2)
Chloride: 96 mmol/L (ref 96–106)
Creatinine, Ser: 2.44 mg/dL — ABNORMAL HIGH (ref 0.76–1.27)
Globulin, Total: 2.6 g/dL (ref 1.5–4.5)
Glucose: 324 mg/dL — ABNORMAL HIGH (ref 65–99)
Potassium: 3.4 mmol/L — ABNORMAL LOW (ref 3.5–5.2)
Sodium: 139 mmol/L (ref 134–144)
Total Protein: 6.3 g/dL (ref 6.0–8.5)
eGFR: 27 mL/min/{1.73_m2} — ABNORMAL LOW (ref >59)

## 2020-12-04 LAB — PTH, INTACT AND CALCIUM: PTH: 126 pg/mL — ABNORMAL HIGH (ref 15–65)

## 2020-12-04 LAB — AMMONIA: Ammonia: 82 ug/dL (ref 31–169)

## 2020-12-04 NOTE — Progress Notes (Signed)
Cbc normal, glucose 324 very high, BUN high- probably dehydrated, increase fluids, creatinine higher also, eGFR 27 low, has he seen nephrology?, may need to be seen, potassium low 3.4, needs repeat in one week, calcium remains high PTH is high showing parathyroid hormone elevation from renal disease,  liver tests ok, Ammonia level 82 normal level,  lp

## 2020-12-06 ENCOUNTER — Other Ambulatory Visit: Payer: Self-pay

## 2020-12-06 MED ORDER — RIVAROXABAN 15 MG PO TABS: 15.0000 mg | ORAL_TABLET | Freq: Every day | ORAL | 1 refills | Status: DC

## 2020-12-07 ENCOUNTER — Other Ambulatory Visit: Payer: Self-pay | Admitting: Family Medicine

## 2020-12-07 DIAGNOSIS — E1151 Type 2 diabetes mellitus with diabetic peripheral angiopathy without gangrene: Secondary | ICD-10-CM | POA: Diagnosis not present

## 2020-12-07 DIAGNOSIS — I5032 Chronic diastolic (congestive) heart failure: Secondary | ICD-10-CM | POA: Diagnosis not present

## 2020-12-07 DIAGNOSIS — E782 Mixed hyperlipidemia: Secondary | ICD-10-CM | POA: Diagnosis not present

## 2020-12-07 DIAGNOSIS — G894 Chronic pain syndrome: Secondary | ICD-10-CM | POA: Diagnosis not present

## 2020-12-07 DIAGNOSIS — J42 Unspecified chronic bronchitis: Secondary | ICD-10-CM | POA: Diagnosis not present

## 2020-12-07 DIAGNOSIS — J449 Chronic obstructive pulmonary disease, unspecified: Secondary | ICD-10-CM | POA: Diagnosis not present

## 2020-12-07 DIAGNOSIS — I82409 Acute embolism and thrombosis of unspecified deep veins of unspecified lower extremity: Secondary | ICD-10-CM | POA: Diagnosis not present

## 2020-12-07 DIAGNOSIS — G4733 Obstructive sleep apnea (adult) (pediatric): Secondary | ICD-10-CM | POA: Diagnosis not present

## 2020-12-07 DIAGNOSIS — I252 Old myocardial infarction: Secondary | ICD-10-CM | POA: Diagnosis not present

## 2020-12-07 DIAGNOSIS — L03317 Cellulitis of buttock: Secondary | ICD-10-CM | POA: Diagnosis not present

## 2020-12-07 DIAGNOSIS — E039 Hypothyroidism, unspecified: Secondary | ICD-10-CM | POA: Diagnosis not present

## 2020-12-07 DIAGNOSIS — M797 Fibromyalgia: Secondary | ICD-10-CM | POA: Diagnosis not present

## 2020-12-07 DIAGNOSIS — I872 Venous insufficiency (chronic) (peripheral): Secondary | ICD-10-CM | POA: Diagnosis not present

## 2020-12-07 DIAGNOSIS — L0231 Cutaneous abscess of buttock: Secondary | ICD-10-CM | POA: Diagnosis not present

## 2020-12-07 DIAGNOSIS — E1142 Type 2 diabetes mellitus with diabetic polyneuropathy: Secondary | ICD-10-CM | POA: Diagnosis not present

## 2020-12-07 DIAGNOSIS — L89320 Pressure ulcer of left buttock, unstageable: Secondary | ICD-10-CM | POA: Diagnosis not present

## 2020-12-07 DIAGNOSIS — I130 Hypertensive heart and chronic kidney disease with heart failure and stage 1 through stage 4 chronic kidney disease, or unspecified chronic kidney disease: Secondary | ICD-10-CM | POA: Diagnosis not present

## 2020-12-07 DIAGNOSIS — I482 Chronic atrial fibrillation, unspecified: Secondary | ICD-10-CM | POA: Diagnosis not present

## 2020-12-07 DIAGNOSIS — K219 Gastro-esophageal reflux disease without esophagitis: Secondary | ICD-10-CM | POA: Diagnosis not present

## 2020-12-07 DIAGNOSIS — N1832 Chronic kidney disease, stage 3b: Secondary | ICD-10-CM | POA: Diagnosis not present

## 2020-12-07 DIAGNOSIS — E1122 Type 2 diabetes mellitus with diabetic chronic kidney disease: Secondary | ICD-10-CM | POA: Diagnosis not present

## 2020-12-07 DIAGNOSIS — R338 Other retention of urine: Secondary | ICD-10-CM | POA: Diagnosis not present

## 2020-12-11 ENCOUNTER — Other Ambulatory Visit: Payer: Self-pay

## 2020-12-12 ENCOUNTER — Other Ambulatory Visit: Payer: Medicare Other

## 2020-12-12 ENCOUNTER — Other Ambulatory Visit: Payer: Self-pay

## 2020-12-12 DIAGNOSIS — R799 Abnormal finding of blood chemistry, unspecified: Secondary | ICD-10-CM | POA: Diagnosis not present

## 2020-12-13 ENCOUNTER — Other Ambulatory Visit: Payer: Self-pay

## 2020-12-13 ENCOUNTER — Telehealth: Payer: Self-pay

## 2020-12-13 ENCOUNTER — Other Ambulatory Visit: Payer: Self-pay | Admitting: Legal Medicine

## 2020-12-13 DIAGNOSIS — G4733 Obstructive sleep apnea (adult) (pediatric): Secondary | ICD-10-CM | POA: Diagnosis not present

## 2020-12-13 DIAGNOSIS — I482 Chronic atrial fibrillation, unspecified: Secondary | ICD-10-CM | POA: Diagnosis not present

## 2020-12-13 DIAGNOSIS — G894 Chronic pain syndrome: Secondary | ICD-10-CM | POA: Diagnosis not present

## 2020-12-13 DIAGNOSIS — E1142 Type 2 diabetes mellitus with diabetic polyneuropathy: Secondary | ICD-10-CM | POA: Diagnosis not present

## 2020-12-13 DIAGNOSIS — E039 Hypothyroidism, unspecified: Secondary | ICD-10-CM | POA: Diagnosis not present

## 2020-12-13 DIAGNOSIS — N1832 Chronic kidney disease, stage 3b: Secondary | ICD-10-CM | POA: Diagnosis not present

## 2020-12-13 DIAGNOSIS — I252 Old myocardial infarction: Secondary | ICD-10-CM | POA: Diagnosis not present

## 2020-12-13 DIAGNOSIS — E1151 Type 2 diabetes mellitus with diabetic peripheral angiopathy without gangrene: Secondary | ICD-10-CM | POA: Diagnosis not present

## 2020-12-13 DIAGNOSIS — K219 Gastro-esophageal reflux disease without esophagitis: Secondary | ICD-10-CM | POA: Diagnosis not present

## 2020-12-13 DIAGNOSIS — L03317 Cellulitis of buttock: Secondary | ICD-10-CM | POA: Diagnosis not present

## 2020-12-13 DIAGNOSIS — E1122 Type 2 diabetes mellitus with diabetic chronic kidney disease: Secondary | ICD-10-CM | POA: Diagnosis not present

## 2020-12-13 DIAGNOSIS — L0231 Cutaneous abscess of buttock: Secondary | ICD-10-CM | POA: Diagnosis not present

## 2020-12-13 DIAGNOSIS — I872 Venous insufficiency (chronic) (peripheral): Secondary | ICD-10-CM | POA: Diagnosis not present

## 2020-12-13 DIAGNOSIS — E876 Hypokalemia: Secondary | ICD-10-CM

## 2020-12-13 DIAGNOSIS — I130 Hypertensive heart and chronic kidney disease with heart failure and stage 1 through stage 4 chronic kidney disease, or unspecified chronic kidney disease: Secondary | ICD-10-CM | POA: Diagnosis not present

## 2020-12-13 DIAGNOSIS — J449 Chronic obstructive pulmonary disease, unspecified: Secondary | ICD-10-CM | POA: Diagnosis not present

## 2020-12-13 DIAGNOSIS — R338 Other retention of urine: Secondary | ICD-10-CM | POA: Diagnosis not present

## 2020-12-13 DIAGNOSIS — I5032 Chronic diastolic (congestive) heart failure: Secondary | ICD-10-CM | POA: Diagnosis not present

## 2020-12-13 DIAGNOSIS — J42 Unspecified chronic bronchitis: Secondary | ICD-10-CM | POA: Diagnosis not present

## 2020-12-13 DIAGNOSIS — L89320 Pressure ulcer of left buttock, unstageable: Secondary | ICD-10-CM | POA: Diagnosis not present

## 2020-12-13 DIAGNOSIS — I82409 Acute embolism and thrombosis of unspecified deep veins of unspecified lower extremity: Secondary | ICD-10-CM | POA: Diagnosis not present

## 2020-12-13 DIAGNOSIS — E782 Mixed hyperlipidemia: Secondary | ICD-10-CM | POA: Diagnosis not present

## 2020-12-13 DIAGNOSIS — M797 Fibromyalgia: Secondary | ICD-10-CM | POA: Diagnosis not present

## 2020-12-13 LAB — COMPREHENSIVE METABOLIC PANEL
ALT: 19 IU/L (ref 0–44)
AST: 19 IU/L (ref 0–40)
Albumin/Globulin Ratio: 1.5 (ref 1.2–2.2)
Albumin: 4.1 g/dL (ref 3.7–4.7)
Alkaline Phosphatase: 127 IU/L — ABNORMAL HIGH (ref 44–121)
BUN/Creatinine Ratio: 16 (ref 10–24)
BUN: 40 mg/dL — ABNORMAL HIGH (ref 8–27)
Bilirubin Total: 0.6 mg/dL (ref 0.0–1.2)
CO2: 25 mmol/L (ref 20–29)
Calcium: 8.5 mg/dL — ABNORMAL LOW (ref 8.6–10.2)
Chloride: 97 mmol/L (ref 96–106)
Creatinine, Ser: 2.46 mg/dL — ABNORMAL HIGH (ref 0.76–1.27)
Globulin, Total: 2.8 g/dL (ref 1.5–4.5)
Glucose: 158 mg/dL — ABNORMAL HIGH (ref 65–99)
Potassium: 3.2 mmol/L — ABNORMAL LOW (ref 3.5–5.2)
Sodium: 144 mmol/L (ref 134–144)
Total Protein: 6.9 g/dL (ref 6.0–8.5)
eGFR: 27 mL/min/{1.73_m2} — ABNORMAL LOW (ref >59)

## 2020-12-13 MED ORDER — POTASSIUM CHLORIDE ER 20 MEQ PO TBCR: 60.0000 meq | EXTENDED_RELEASE_TABLET | Freq: Two times a day (BID) | ORAL | 0 refills | Status: DC

## 2020-12-13 NOTE — Progress Notes (Signed)
Glucose 158, kidney tests stable, potassium still low at 3.2, increase potassium pills to 3 a day and recheck 2 weeks, may need aldosterone checked. lp

## 2020-12-13 NOTE — Telephone Encounter (Signed)
Called [facility] to schedule appt for patient. Patient states he is "unable to make his own appt due to [facility] needing papers sent over".   Per [facility] patient was last seen 12/2019 and was told to f/u PRN. Due to this they need office notes and labs. Faxed office notes and labs to [phone] so one of their providers can review and determine how quickly he needs to be seen.  Our office should be contacted by next Monday or Tuesday.

## 2020-12-17 ENCOUNTER — Telehealth: Payer: Self-pay

## 2020-12-17 NOTE — Progress Notes (Signed)
Chronic Care Management Pharmacy Assistant   Name: Jon [name]  MRN: [id number] DOB: December 28, 1950   Reason for Encounter: Disease State for hyptertension   Recent office visits:  12/13/20-blood work ordered.  Glucose 158, kidney tests stable, potassium still low at 3.2, increase potassium pills to 3 a day and recheck 2 weeks, may need aldosterone checked, referral to [facility].  12/03/20-DrTobie Poet PCP, Hospital follow up, start Hydroxyzine, stop Oxycodone.   Recent consult visits:  none  Hospital visits:  None in previous 6 months  Medications: Outpatient Encounter Medications as of 12/17/2020  Medication Sig  . albuterol (PROVENTIL) (2.5 MG/3ML) 0.083% nebulizer solution Take 3 mLs (2.5 mg total) by nebulization 2 (two) times daily as needed for wheezing.  Marland Kitchen atorvastatin (LIPITOR) 40 MG tablet TAKE 1 TABLET BY MOUTH ONCE DAILY  . Azelastine HCl 0.15 % SOLN USE 1 SPRAY IN EACH NOSTRIL TWICE DAILY  . BD PEN NEEDLE NANO 2ND GEN 32G X 4 MM MISC USE DAILY WITH BASAGLAR  . clindamycin (CLEOCIN) 300 MG capsule Take 300 mg by mouth every 6 (six) hours.  . clopidogrel (PLAVIX) 75 MG tablet TAKE 1 TABLET BY MOUTH DAILY  . Continuous Blood Gluc Receiver (DEXCOM G6 RECEIVER) DEVI 1 each by Does not apply route 4 (four) times daily -  before meals and at bedtime. (Patient not taking: Reported on 10/18/2020)  . Continuous Blood Gluc Sensor (DEXCOM G6 SENSOR) MISC Apply every 10 days (Patient not taking: Reported on 10/18/2020)  . diltiazem (CARDIZEM CD) 120 MG 24 hr capsule TAKE 1 CAPSULE BY MOUTH DAILY  . docusate sodium (COLACE) 100 MG capsule Take 100 mg by mouth 2 (two) times daily.  . famotidine (PEPCID) 20 MG tablet TAKE 2 TABLETS BY MOUTH TWICE DAILY  . ferrous sulfate 325 (65 FE) MG tablet Take 325 mg by mouth daily.  . Glucagon, rDNA, (GLUCAGON EMERGENCY) 1 MG KIT Inject 1 mg as directed as needed (low blood sugar).   Marland Kitchen HUMALOG KWIKPEN 100 UNIT/ML KwikPen PER SLIDING SCALE  ADMINSTER AS FOLLOWS: >150-200 2 U,201-250 4 U,251-300 8 U, 301-350 12 U,351-400 16 U,>400 20 U  . hydrALAZINE (APRESOLINE) 100 MG tablet TAKE ONE (1) TABLET BY MOUTH 3 TIMES DAILY. DISCONTINE DILTIAZEM.  Marland Kitchen hydrOXYzine (ATARAX/VISTARIL) 50 MG tablet Take 1 tablet (50 mg total) by mouth 3 (three) times daily as needed.  . insulin detemir (LEVEMIR FLEXTOUCH) 100 UNIT/ML FlexPen Inject 30 Units into the skin daily. (Patient taking differently: Inject 26 Units into the skin daily.)  . Insulin Regular Human (NOVOLIN R FLEXPEN) 100 UNIT/ML SOPN PER SLIDING SCALE ADMINSTER AS FOLLOWS: >150-200 2 U,201-250 4 U,251-300 8 U, 301-350 12 U,351-400 16 U,>400 20 U before each meal. (Patient not taking: Reported on 10/18/2020)  . isosorbide mononitrate (IMDUR) 120 MG 24 hr tablet TAKE 1 TABLET BY MOUTH ONCE DAILY IN THEMORNINGS  . lactulose (CHRONULAC) 10 GM/15ML solution TAKE 30MLS BY MOUTH IN THE MORNING AND AT BEDTIME  . levocetirizine (XYZAL) 5 MG tablet TAKE 1 TABLET BY MOUTH EVERY EVENING  . metolazone (ZAROXOLYN) 2.5 MG tablet TAKE 1 TABLET BY MOUTH DAILY 3 HOURS PRIOR TO LASIX DAILY  . metoprolol succinate (TOPROL-XL) 50 MG 24 hr tablet TAKE 1 TABLET BY MOUTH ONCE DAILY  . mometasone-formoterol (DULERA) 200-5 MCG/ACT AERO Inhale 2 puffs into the lungs 2 (two) times daily as needed for wheezing.  Marland Kitchen MOVANTIK 25 MG TABS tablet TAKE 1 TABLET BY MOUTH DAILY  . nitroGLYCERIN (NITROSTAT) 0.4 MG SL  tablet Place 0.4 mg under the tongue every 5 (five) minutes as needed for chest pain.   . OXYGEN Inhale into the lungs. 2 liters at bedtime  . OZEMPIC, 1 MG/DOSE, 4 MG/3ML SOPN INJECT 1MG INTO THE SKIN ONCE A WEEK  . pantoprazole (PROTONIX) 40 MG tablet Take 40 mg by mouth in the morning.   . Potassium Chloride ER 20 MEQ TBCR Take 60 mEq by mouth 2 (two) times daily.  . potassium chloride SA (KLOR-CON) 20 MEQ tablet TAKE TWO TABLETS BY MOUTH TWICE A DAY  . pregabalin (LYRICA) 75 MG capsule TAKE 1 CAPSULE BY MOUTH TWICE  DAILY  . Rivaroxaban (XARELTO) 15 MG TABS tablet Take 1 tablet (15 mg total) by mouth at bedtime.  . sertraline (ZOLOFT) 50 MG tablet TAKE 1 TABLET BY MOUTH DAILY  . tamsulosin (FLOMAX) 0.4 MG CAPS capsule TAKE 2 CAPSULES BY MOUTH DAILY AFTER SUPPER  . torsemide (DEMADEX) 20 MG tablet TAKE 3 TABLETS BY MOUTH AT LUNCH AND TAKE 3 TABLETS AT SUPPER  . Vitamin D, Ergocalciferol, (DRISDOL) 1.25 MG (50000 UNIT) CAPS capsule TAKE 1 CAPSULE BY MOUTH DAILY  . XTAMPZA ER 27 MG C12A Take 1 capsule by mouth 2 (two) times daily.   No facility-administered encounter medications on file as of 12/17/2020.    Reviewed chart prior to disease state call. Spoke with patient regarding BP  Recent Office Vitals: BP Readings from Last 3 Encounters:  12/03/20 124/62  11/14/20 136/68  08/28/20 130/68   Pulse Readings from Last 3 Encounters:  12/03/20 79  11/14/20 91  08/28/20 88    Wt Readings from Last 3 Encounters:  12/03/20 266 lb (120.7 kg)  11/14/20 250 lb (113.4 kg)  08/28/20 253 lb (114.8 kg)     Kidney Function Lab Results  Component Value Date/Time   CREATININE 2.46 (H) 12/12/2020 11:48 AM   CREATININE 2.44 (H) 12/03/2020 03:40 PM   GFRNONAA 30 (L) 08/28/2020 02:10 PM   GFRAA 35 (L) 08/28/2020 02:10 PM    BMP Latest Ref Rng & Units 12/12/2020 12/03/2020 08/28/2020  Glucose 65 - 99 mg/dL 158(H) 324(H) 146(H)  BUN 8 - 27 mg/dL 40(H) 51(H) 25  Creatinine 0.76 - 1.27 mg/dL 2.46(H) 2.44(H) 2.14(H)  BUN/Creat Ratio 10 - _0 Sodium 134 - 144 mmol/L 144 139 139  Potassium 3.5 - 5.2 mmol/L 3.2(L) 3.4(L) 3.2(L)  Chloride 96 - 106 mmol/L 97 96 96  CO2 20 - 29 mmol/L _1 Calcium 8.6 - 10.2 mg/dL 8.5(L) 8.1(L) 8.1(L)    . Current antihypertensive regimen:   metoprolol xl 50 mg daily   Isosorbide mn 120 gm daily am   Hydralazone 100 mg tid  Metolazone 2.5 mg tid   Torsemide 20 mg 3 tablets at lunch and supper  Adherence Review: Is the patient currently on ACE/ARB  medication? Yes Does the patient have >5 day gap between last estimated fill dates? No   [facility]ication Atorvastatin        10/08/20       90ds Metoprolol           10/03/20       90ds   Have been unable to reach patient, phone goes straight to voice mail  Clarita Leber, [place] Pharmacist Assistant [phone]

## 2020-12-20 DIAGNOSIS — E1151 Type 2 diabetes mellitus with diabetic peripheral angiopathy without gangrene: Secondary | ICD-10-CM | POA: Diagnosis not present

## 2020-12-20 DIAGNOSIS — E1142 Type 2 diabetes mellitus with diabetic polyneuropathy: Secondary | ICD-10-CM | POA: Diagnosis not present

## 2020-12-20 DIAGNOSIS — I252 Old myocardial infarction: Secondary | ICD-10-CM | POA: Diagnosis not present

## 2020-12-20 DIAGNOSIS — I82409 Acute embolism and thrombosis of unspecified deep veins of unspecified lower extremity: Secondary | ICD-10-CM | POA: Diagnosis not present

## 2020-12-20 DIAGNOSIS — L89320 Pressure ulcer of left buttock, unstageable: Secondary | ICD-10-CM | POA: Diagnosis not present

## 2020-12-20 DIAGNOSIS — J449 Chronic obstructive pulmonary disease, unspecified: Secondary | ICD-10-CM | POA: Diagnosis not present

## 2020-12-20 DIAGNOSIS — M797 Fibromyalgia: Secondary | ICD-10-CM | POA: Diagnosis not present

## 2020-12-20 DIAGNOSIS — E1122 Type 2 diabetes mellitus with diabetic chronic kidney disease: Secondary | ICD-10-CM | POA: Diagnosis not present

## 2020-12-20 DIAGNOSIS — I130 Hypertensive heart and chronic kidney disease with heart failure and stage 1 through stage 4 chronic kidney disease, or unspecified chronic kidney disease: Secondary | ICD-10-CM | POA: Diagnosis not present

## 2020-12-20 DIAGNOSIS — G4733 Obstructive sleep apnea (adult) (pediatric): Secondary | ICD-10-CM | POA: Diagnosis not present

## 2020-12-20 DIAGNOSIS — N1832 Chronic kidney disease, stage 3b: Secondary | ICD-10-CM | POA: Diagnosis not present

## 2020-12-20 DIAGNOSIS — R338 Other retention of urine: Secondary | ICD-10-CM | POA: Diagnosis not present

## 2020-12-20 DIAGNOSIS — L03317 Cellulitis of buttock: Secondary | ICD-10-CM | POA: Diagnosis not present

## 2020-12-20 DIAGNOSIS — G894 Chronic pain syndrome: Secondary | ICD-10-CM | POA: Diagnosis not present

## 2020-12-20 DIAGNOSIS — I872 Venous insufficiency (chronic) (peripheral): Secondary | ICD-10-CM | POA: Diagnosis not present

## 2020-12-20 DIAGNOSIS — K219 Gastro-esophageal reflux disease without esophagitis: Secondary | ICD-10-CM | POA: Diagnosis not present

## 2020-12-20 DIAGNOSIS — I482 Chronic atrial fibrillation, unspecified: Secondary | ICD-10-CM | POA: Diagnosis not present

## 2020-12-20 DIAGNOSIS — L0231 Cutaneous abscess of buttock: Secondary | ICD-10-CM | POA: Diagnosis not present

## 2020-12-20 DIAGNOSIS — J42 Unspecified chronic bronchitis: Secondary | ICD-10-CM | POA: Diagnosis not present

## 2020-12-20 DIAGNOSIS — I5032 Chronic diastolic (congestive) heart failure: Secondary | ICD-10-CM | POA: Diagnosis not present

## 2020-12-20 DIAGNOSIS — E039 Hypothyroidism, unspecified: Secondary | ICD-10-CM | POA: Diagnosis not present

## 2020-12-20 DIAGNOSIS — E782 Mixed hyperlipidemia: Secondary | ICD-10-CM | POA: Diagnosis not present

## 2020-12-21 ENCOUNTER — Other Ambulatory Visit: Payer: Self-pay | Admitting: Family Medicine

## 2020-12-23 DIAGNOSIS — J9610 Chronic respiratory failure, unspecified whether with hypoxia or hypercapnia: Secondary | ICD-10-CM | POA: Diagnosis not present

## 2020-12-24 ENCOUNTER — Encounter: Payer: Self-pay | Admitting: Family Medicine

## 2020-12-25 DIAGNOSIS — J9610 Chronic respiratory failure, unspecified whether with hypoxia or hypercapnia: Secondary | ICD-10-CM | POA: Diagnosis not present

## 2020-12-27 ENCOUNTER — Other Ambulatory Visit: Payer: Medicare Other

## 2020-12-27 DIAGNOSIS — E876 Hypokalemia: Secondary | ICD-10-CM

## 2020-12-28 DIAGNOSIS — L0231 Cutaneous abscess of buttock: Secondary | ICD-10-CM | POA: Diagnosis not present

## 2020-12-28 DIAGNOSIS — L89320 Pressure ulcer of left buttock, unstageable: Secondary | ICD-10-CM | POA: Diagnosis not present

## 2020-12-28 DIAGNOSIS — J449 Chronic obstructive pulmonary disease, unspecified: Secondary | ICD-10-CM | POA: Diagnosis not present

## 2020-12-28 DIAGNOSIS — I82409 Acute embolism and thrombosis of unspecified deep veins of unspecified lower extremity: Secondary | ICD-10-CM | POA: Diagnosis not present

## 2020-12-28 DIAGNOSIS — E782 Mixed hyperlipidemia: Secondary | ICD-10-CM | POA: Diagnosis not present

## 2020-12-28 DIAGNOSIS — R338 Other retention of urine: Secondary | ICD-10-CM | POA: Diagnosis not present

## 2020-12-28 DIAGNOSIS — L03317 Cellulitis of buttock: Secondary | ICD-10-CM | POA: Diagnosis not present

## 2020-12-28 DIAGNOSIS — J42 Unspecified chronic bronchitis: Secondary | ICD-10-CM | POA: Diagnosis not present

## 2020-12-28 DIAGNOSIS — K219 Gastro-esophageal reflux disease without esophagitis: Secondary | ICD-10-CM | POA: Diagnosis not present

## 2020-12-28 DIAGNOSIS — I130 Hypertensive heart and chronic kidney disease with heart failure and stage 1 through stage 4 chronic kidney disease, or unspecified chronic kidney disease: Secondary | ICD-10-CM | POA: Diagnosis not present

## 2020-12-28 DIAGNOSIS — E1151 Type 2 diabetes mellitus with diabetic peripheral angiopathy without gangrene: Secondary | ICD-10-CM | POA: Diagnosis not present

## 2020-12-28 DIAGNOSIS — I872 Venous insufficiency (chronic) (peripheral): Secondary | ICD-10-CM | POA: Diagnosis not present

## 2020-12-28 DIAGNOSIS — I252 Old myocardial infarction: Secondary | ICD-10-CM | POA: Diagnosis not present

## 2020-12-28 DIAGNOSIS — G4733 Obstructive sleep apnea (adult) (pediatric): Secondary | ICD-10-CM | POA: Diagnosis not present

## 2020-12-28 DIAGNOSIS — I482 Chronic atrial fibrillation, unspecified: Secondary | ICD-10-CM | POA: Diagnosis not present

## 2020-12-28 DIAGNOSIS — E1122 Type 2 diabetes mellitus with diabetic chronic kidney disease: Secondary | ICD-10-CM | POA: Diagnosis not present

## 2020-12-28 DIAGNOSIS — I5032 Chronic diastolic (congestive) heart failure: Secondary | ICD-10-CM | POA: Diagnosis not present

## 2020-12-28 DIAGNOSIS — E039 Hypothyroidism, unspecified: Secondary | ICD-10-CM | POA: Diagnosis not present

## 2020-12-28 DIAGNOSIS — M797 Fibromyalgia: Secondary | ICD-10-CM | POA: Diagnosis not present

## 2020-12-28 DIAGNOSIS — E1142 Type 2 diabetes mellitus with diabetic polyneuropathy: Secondary | ICD-10-CM | POA: Diagnosis not present

## 2020-12-28 DIAGNOSIS — N1832 Chronic kidney disease, stage 3b: Secondary | ICD-10-CM | POA: Diagnosis not present

## 2020-12-28 DIAGNOSIS — G894 Chronic pain syndrome: Secondary | ICD-10-CM | POA: Diagnosis not present

## 2020-12-28 LAB — COMPREHENSIVE METABOLIC PANEL
ALT: 8 IU/L (ref 0–44)
AST: 10 IU/L (ref 0–40)
Albumin/Globulin Ratio: 1.3 (ref 1.2–2.2)
Albumin: 3.7 g/dL (ref 3.7–4.7)
Alkaline Phosphatase: 101 IU/L (ref 44–121)
BUN/Creatinine Ratio: 14 (ref 10–24)
BUN: 34 mg/dL — ABNORMAL HIGH (ref 8–27)
Bilirubin Total: 0.7 mg/dL (ref 0.0–1.2)
CO2: 21 mmol/L (ref 20–29)
Calcium: 9 mg/dL (ref 8.6–10.2)
Chloride: 102 mmol/L (ref 96–106)
Creatinine, Ser: 2.39 mg/dL — ABNORMAL HIGH (ref 0.76–1.27)
Globulin, Total: 2.8 g/dL (ref 1.5–4.5)
Glucose: 118 mg/dL — ABNORMAL HIGH (ref 65–99)
Potassium: 3.6 mmol/L (ref 3.5–5.2)
Sodium: 142 mmol/L (ref 134–144)
Total Protein: 6.5 g/dL (ref 6.0–8.5)
eGFR: 28 mL/min/{1.73_m2} — ABNORMAL LOW (ref >59)

## 2021-01-02 ENCOUNTER — Other Ambulatory Visit: Payer: Self-pay

## 2021-01-02 ENCOUNTER — Other Ambulatory Visit: Payer: Self-pay | Admitting: Family Medicine

## 2021-01-02 DIAGNOSIS — L03317 Cellulitis of buttock: Secondary | ICD-10-CM | POA: Diagnosis not present

## 2021-01-02 DIAGNOSIS — I872 Venous insufficiency (chronic) (peripheral): Secondary | ICD-10-CM | POA: Diagnosis not present

## 2021-01-02 DIAGNOSIS — E876 Hypokalemia: Secondary | ICD-10-CM

## 2021-01-02 DIAGNOSIS — E1142 Type 2 diabetes mellitus with diabetic polyneuropathy: Secondary | ICD-10-CM

## 2021-01-02 DIAGNOSIS — I482 Chronic atrial fibrillation, unspecified: Secondary | ICD-10-CM | POA: Diagnosis not present

## 2021-01-02 DIAGNOSIS — M797 Fibromyalgia: Secondary | ICD-10-CM | POA: Diagnosis not present

## 2021-01-02 DIAGNOSIS — I5032 Chronic diastolic (congestive) heart failure: Secondary | ICD-10-CM | POA: Diagnosis not present

## 2021-01-02 DIAGNOSIS — I130 Hypertensive heart and chronic kidney disease with heart failure and stage 1 through stage 4 chronic kidney disease, or unspecified chronic kidney disease: Secondary | ICD-10-CM | POA: Diagnosis not present

## 2021-01-02 DIAGNOSIS — J42 Unspecified chronic bronchitis: Secondary | ICD-10-CM | POA: Diagnosis not present

## 2021-01-02 DIAGNOSIS — L89320 Pressure ulcer of left buttock, unstageable: Secondary | ICD-10-CM | POA: Diagnosis not present

## 2021-01-02 DIAGNOSIS — J449 Chronic obstructive pulmonary disease, unspecified: Secondary | ICD-10-CM | POA: Diagnosis not present

## 2021-01-02 DIAGNOSIS — L0231 Cutaneous abscess of buttock: Secondary | ICD-10-CM | POA: Diagnosis not present

## 2021-01-02 DIAGNOSIS — E1140 Type 2 diabetes mellitus with diabetic neuropathy, unspecified: Secondary | ICD-10-CM

## 2021-01-02 DIAGNOSIS — I252 Old myocardial infarction: Secondary | ICD-10-CM | POA: Diagnosis not present

## 2021-01-02 DIAGNOSIS — E1122 Type 2 diabetes mellitus with diabetic chronic kidney disease: Secondary | ICD-10-CM | POA: Diagnosis not present

## 2021-01-02 DIAGNOSIS — I82409 Acute embolism and thrombosis of unspecified deep veins of unspecified lower extremity: Secondary | ICD-10-CM | POA: Diagnosis not present

## 2021-01-02 DIAGNOSIS — G894 Chronic pain syndrome: Secondary | ICD-10-CM | POA: Diagnosis not present

## 2021-01-02 DIAGNOSIS — R338 Other retention of urine: Secondary | ICD-10-CM | POA: Diagnosis not present

## 2021-01-02 DIAGNOSIS — E039 Hypothyroidism, unspecified: Secondary | ICD-10-CM | POA: Diagnosis not present

## 2021-01-02 DIAGNOSIS — E1151 Type 2 diabetes mellitus with diabetic peripheral angiopathy without gangrene: Secondary | ICD-10-CM | POA: Diagnosis not present

## 2021-01-02 DIAGNOSIS — G4733 Obstructive sleep apnea (adult) (pediatric): Secondary | ICD-10-CM | POA: Diagnosis not present

## 2021-01-02 DIAGNOSIS — N1832 Chronic kidney disease, stage 3b: Secondary | ICD-10-CM | POA: Diagnosis not present

## 2021-01-02 DIAGNOSIS — E782 Mixed hyperlipidemia: Secondary | ICD-10-CM | POA: Diagnosis not present

## 2021-01-02 DIAGNOSIS — K219 Gastro-esophageal reflux disease without esophagitis: Secondary | ICD-10-CM | POA: Diagnosis not present

## 2021-01-02 MED ORDER — OZEMPIC (1 MG/DOSE) 4 MG/3ML ~~LOC~~ SOPN: PEN_INJECTOR | SUBCUTANEOUS | 2 refills | Status: DC

## 2021-01-02 MED ORDER — POTASSIUM CHLORIDE CRYS ER 20 MEQ PO TBCR: 60.0000 meq | EXTENDED_RELEASE_TABLET | Freq: Two times a day (BID) | ORAL | 0 refills | Status: DC

## 2021-01-02 MED ORDER — METOPROLOL SUCCINATE ER 50 MG PO TB24: 50.0000 mg | ORAL_TABLET | Freq: Every day | ORAL | 1 refills | Status: DC

## 2021-01-02 MED ORDER — SERTRALINE HCL 50 MG PO TABS: 1.0000 | ORAL_TABLET | Freq: Every day | ORAL | 1 refills | Status: DC

## 2021-01-02 MED ORDER — FAMOTIDINE 40 MG PO TABS: 40.0000 mg | ORAL_TABLET | Freq: Two times a day (BID) | ORAL | 1 refills | Status: DC

## 2021-01-02 MED ORDER — TORSEMIDE 20 MG PO TABS: ORAL_TABLET | ORAL | 1 refills | Status: DC

## 2021-01-02 MED ORDER — LEVOCETIRIZINE DIHYDROCHLORIDE 5 MG PO TABS: 5.0000 mg | ORAL_TABLET | Freq: Every evening | ORAL | 3 refills | Status: DC

## 2021-01-02 MED ORDER — DILTIAZEM HCL ER COATED BEADS 120 MG PO CP24: 120.0000 mg | ORAL_CAPSULE | Freq: Every day | ORAL | 1 refills | Status: DC

## 2021-01-02 MED ORDER — ATORVASTATIN CALCIUM 40 MG PO TABS: 1.0000 | ORAL_TABLET | Freq: Every day | ORAL | 1 refills | Status: DC

## 2021-01-02 MED ORDER — HYDROXYZINE HCL 50 MG PO TABS: 50.0000 mg | ORAL_TABLET | Freq: Three times a day (TID) | ORAL | 1 refills | Status: DC | PRN

## 2021-01-02 MED ORDER — RIVAROXABAN 15 MG PO TABS: 15.0000 mg | ORAL_TABLET | Freq: Every day | ORAL | 1 refills | Status: DC

## 2021-01-02 MED ORDER — LEVEMIR FLEXTOUCH 100 UNIT/ML ~~LOC~~ SOPN: 30.0000 [IU] | PEN_INJECTOR | Freq: Every day | SUBCUTANEOUS | Status: DC

## 2021-01-02 MED ORDER — TAMSULOSIN HCL 0.4 MG PO CAPS: ORAL_CAPSULE | ORAL | 1 refills | Status: DC

## 2021-01-02 MED ORDER — CLOPIDOGREL BISULFATE 75 MG PO TABS: 1.0000 | ORAL_TABLET | Freq: Every day | ORAL | 1 refills | Status: DC

## 2021-01-02 MED ORDER — ALBUTEROL SULFATE (2.5 MG/3ML) 0.083% IN NEBU: 2.5000 mg | INHALATION_SOLUTION | Freq: Two times a day (BID) | RESPIRATORY_TRACT | 1 refills | Status: DC | PRN

## 2021-01-02 MED ORDER — BD PEN NEEDLE NANO 2ND GEN 32G X 4 MM MISC: 3 refills | Status: DC

## 2021-01-02 MED ORDER — INSULIN LISPRO (1 UNIT DIAL) 100 UNIT/ML (KWIKPEN): PEN_INJECTOR | SUBCUTANEOUS | 1 refills | Status: DC

## 2021-01-02 MED ORDER — PANTOPRAZOLE SODIUM 40 MG PO TBEC: 40.0000 mg | DELAYED_RELEASE_TABLET | Freq: Every morning | ORAL | 3 refills | Status: DC

## 2021-01-02 MED ORDER — VITAMIN D (ERGOCALCIFEROL) 1.25 MG (50000 UNIT) PO CAPS: 1.0000 | ORAL_CAPSULE | Freq: Every day | ORAL | 1 refills | Status: DC

## 2021-01-02 NOTE — Telephone Encounter (Signed)
Pt switching back to [facility], needs all scripts sent to them.   Royce Macadamia, [place] 01/02/21 2:58 PM

## 2021-01-03 ENCOUNTER — Other Ambulatory Visit: Payer: Self-pay | Admitting: Family Medicine

## 2021-01-03 MED ORDER — VITAMIN D (ERGOCALCIFEROL) 1.25 MG (50000 UNIT) PO CAPS: 1.0000 | ORAL_CAPSULE | Freq: Every day | ORAL | 1 refills | Status: DC

## 2021-01-04 ENCOUNTER — Other Ambulatory Visit: Payer: Self-pay

## 2021-01-04 MED ORDER — VITAMIN D (ERGOCALCIFEROL) 1.25 MG (50000 UNIT) PO CAPS: 1.0000 | ORAL_CAPSULE | ORAL | 1 refills | Status: DC

## 2021-01-04 NOTE — Progress Notes (Signed)
Chronic Care Management Pharmacy Note  01/09/2021 Name:  Jon [name] MRN:  [id number] DOB:  1950/09/29   Plan Recommendations:   Per [facility] has reached out to patient multiple times to schedule but has been unsuccessful.   Recommend increase in Ozempic to 2 mg weekly if Dr. Tobie Poet approves to improve blood sugar readings. Pharmacist completing Dexcom setup in office 01/30/2021.    Subjective: Jon [name] is an 70 y.o. year old male who is a primary patient of Cox, Kirsten, MD.  The CCM team was consulted for assistance with disease management and care coordination needs.    Engaged with patient by telephone for follow up visit in response to provider referral for pharmacy case management and/or care coordination services.   Consent to Services:  The patient was given information about Chronic Care Management services, agreed to services, and gave verbal consent prior to initiation of services.  Please see initial visit note for detailed documentation.   Patient Care Team: Rochel Brome, MD as PCP - General (Family Medicine) Sharon Mt, MD (Inactive) Debara Pickett, Nadean Corwin, MD as Consulting Physician (Cardiology) Elsie Stain, MD as Consulting Physician (Pulmonary Disease) Corliss Parish, MD as Consulting Physician (Nephrology) Burnice Logan, [facility] as Pharmacist (Pharmacist) Evelina Bucy, DPM as Consulting Physician (Podiatry)  Recent office visits: 12/12/2020 - potassium three tablets twice daily for low level.  12/03/2020 - puriritis. Start on atarax tid. Stop Xyzal. Check ammonia level.  11/14/2020 - Rocephin for butock abscess. Sent to ED for surgical evaluation and possible IV antibiotics.  10/08/2020 - cancelled appointment due to lack of transportation.  08/28/2020 - referral to nephrology and urology. Tamsulosin.  Recent consult visits: Hasn't seen specialist due to lack of transportation.   Hospital visits:   Objective:  Lab  Results  Component Value Date   CREATININE 2.39 (H) 12/27/2020   BUN 34 (H) 12/27/2020   GFRNONAA 30 (L) 08/28/2020   GFRAA 35 (L) 08/28/2020   NA 142 12/27/2020   K 3.6 12/27/2020   CALCIUM 9.0 12/27/2020   CO2 21 12/27/2020    Lab Results  Component Value Date/Time   HGBA1C 9.2 (H) 11/14/2020 11:53 AM   HGBA1C 9.2 (H) 07/05/2020 09:19 AM    Last diabetic Eye exam:  Lab Results  Component Value Date/Time   HMDIABEYEEXA No Retinopathy 07/11/2020 10:12 AM    Last diabetic Foot exam: No results found for: HMDIABFOOTEX   Lab Results  Component Value Date   CHOL 98 (L) 11/14/2020   HDL 37 (L) 11/14/2020   LDLCALC 41 11/14/2020   TRIG 106 11/14/2020   CHOLHDL 2.6 11/14/2020    Hepatic Function Latest Ref Rng & Units 12/27/2020 12/12/2020 12/03/2020  Total Protein 6.0 - 8.5 g/dL 6.5 6.9 6.3  Albumin 3.7 - 4.7 g/dL 3.7 4.1 3.7  AST 0 - 40 IU/L $Remov'10 19 17  'ACxlUY$ ALT 0 - 44 IU/L $Remov'8 19 22  'MjDvVs$ Alk Phosphatase 44 - 121 IU/L 101 127(H) 119  Total Bilirubin 0.0 - 1.2 mg/dL 0.7 0.6 0.4    Lab Results  Component Value Date/Time   TSH 1.030 11/14/2020 11:53 AM   TSH 1.560 03/27/2020 02:40 PM   FREET4 1.33 02/01/2014 02:15 AM    CBC Latest Ref Rng & Units 12/03/2020 07/05/2020 05/01/2020  WBC 3.4 - 10.8 x10E3/uL 9.1 7.1 11.9(H)  Hemoglobin 13.0 - 17.7 g/dL 14.6 14.6 13.4  Hematocrit 37.5 - 51.0 % 42.8 43.3 40.5  Platelets 150 - 450  x10E3/uL 272 316 309    Lab Results  Component Value Date/Time   VD25OH 61.8 10/11/2019 03:45 PM    Clinical ASCVD: Yes  The ASCVD Risk score Mikey Bussing DC Jr., et al., 2013) failed to calculate for the following reasons:   The patient has a prior MI or stroke diagnosis    Depression screen [facility] 2/9 12/03/2020 08/28/2020 08/16/2020  Decreased Interest 0 0 0  Down, Depressed, Hopeless 0 0 0  PHQ - 2 Score 0 0 0  Altered sleeping 0 - -  Tired, decreased energy 0 - -  Change in appetite 0 - -  Feeling bad or failure about yourself  0 - -  Trouble concentrating 0 -  -  Moving slowly or fidgety/restless 0 - -  Suicidal thoughts 0 - -  PHQ-9 Score 0 - -  Difficult doing work/chores Not difficult at all - -  Some recent data might be hidden      Social History   Tobacco Use  Smoking Status Former Smoker  . Types: Cigars  . Quit date: 09/01/2006  . Years since quitting: 14.3  Smokeless Tobacco Never Used   BP Readings from Last 3 Encounters:  12/03/20 124/62  11/14/20 136/68  08/28/20 130/68   Pulse Readings from Last 3 Encounters:  12/03/20 79  11/14/20 91  08/28/20 88   Wt Readings from Last 3 Encounters:  12/03/20 266 lb (120.7 kg)  11/14/20 250 lb (113.4 kg)  08/28/20 253 lb (114.8 kg)    Assessment/Interventions: Review of patient past medical history, allergies, medications, health status, including review of consultants reports, laboratory and other test data, was performed as part of comprehensive evaluation and provision of chronic care management services.   SDOH:  (Social Determinants of Health) assessments and interventions performed: Yes   CCM Care Plan  Allergies  Allergen Reactions  . Hydrocodone Itching  . Morphine Itching  . Duloxetine Hcl Other (See Comments)    Unknown reaction  . Codeine Itching  . Penicillins Itching and Rash    DID THE REACTION INVOLVE: Swelling of the face/tongue/throat, SOB, or low BP? No Sudden or severe rash/hives, skin peeling, or the inside of the mouth or nose? No Did it require medical treatment? No When did it last happen?unknown If all above answers are "NO", may proceed with cephalosporin use.     Medications Reviewed Today    Reviewed by Burnice Logan, [facility] (Pharmacist) on 01/09/21 at [place] List Status: <None>  Medication Order Taking? Sig Documenting Provider Last Dose Status Informant  albuterol (PROVENTIL) (2.5 MG/3ML) 0.083% nebulizer solution [id number] Yes Take 3 mLs (2.5 mg total) by nebulization 2 (two) times daily as needed for wheezing. Cox, Kirsten, MD  Taking Active   atorvastatin (LIPITOR) 40 MG tablet [id number] Yes Take 1 tablet (40 mg total) by mouth daily. Cox, Kirsten, MD Taking Active   Azelastine HCl 0.15 % SOLN [id number] Yes USE 1 SPRAY IN EACH NOSTRIL TWICE DAILY Cox, Kirsten, MD Taking Active   clindamycin (CLEOCIN) 300 MG capsule [id number] No Take 300 mg by mouth every 6 (six) hours.  Patient not taking: Reported on 01/08/2021   [provider] Not Taking Active   clopidogrel (PLAVIX) 75 MG tablet [id number] Yes Take 1 tablet (75 mg total) by mouth daily. Cox, Kirsten, MD Taking Active   Continuous Blood Gluc Receiver (DEXCOM G6 RECEIVER) DEVI [id number] No 1 each by Does not apply route 4 (four) times daily -  before meals and at  bedtime.  Patient not taking: Reported on 01/08/2021   CoxElnita Maxwell, MD Not Taking Active   Continuous Blood Gluc Sensor (DEXCOM G6 SENSOR) MISC [id number] No Apply every 10 days  Patient not taking: No sig reported   Cox, Elnita Maxwell, MD Not Taking Active   diltiazem (CARDIZEM CD) 120 MG 24 hr capsule [id number] Yes Take 1 capsule (120 mg total) by mouth daily. Cox, Kirsten, MD Taking Active   docusate sodium (COLACE) 100 MG capsule [id number] Yes Take 100 mg by mouth 2 (two) times daily. [provider] Taking Active Self  famotidine (PEPCID) 40 MG tablet [id number] Yes Take 1 tablet (40 mg total) by mouth 2 (two) times daily. Cox, Kirsten, MD Taking Active   ferrous sulfate 325 (65 FE) MG tablet [id number] Yes Take 325 mg by mouth daily. [provider] Taking Active Self  Glucagon, rDNA, (GLUCAGON EMERGENCY) 1 MG KIT [id number] Yes Inject 1 mg as directed as needed (low blood sugar).  [provider] Taking Active Self  hydrALAZINE (APRESOLINE) 100 MG tablet [id number] Yes TAKE ONE (1) TABLET BY MOUTH 3 TIMES DAILY. DISCONTINE DILTIAZEM. Lillard Anes, MD Taking Active   hydrOXYzine (ATARAX/VISTARIL) 50 MG tablet [id number]  Take 1 tablet (50 mg total) by mouth 3 (three)  times daily as needed. Cox, Kirsten, MD  Active   insulin detemir (LEVEMIR FLEXTOUCH) 100 UNIT/ML FlexPen [id number]  Inject 40 Units into the skin daily. Cox, Kirsten, MD  Active   insulin lispro (HUMALOG KWIKPEN) 100 UNIT/ML KwikPen [id number] Yes PER SLIDING SCALE ADMINSTER AS FOLLOWS: >150-200 2 U,201-250 4 U,251-300 8 U, 301-350 12 U,351-400 16 U,>400 20 U Cox, Kirsten, MD Taking Active   Insulin Pen Needle (BD PEN NEEDLE NANO 2ND GEN) 32G X 4 MM MISC [id number] Yes USE DAILY WITH [place] Crocker, Kirsten, MD Taking Active   isosorbide mononitrate (IMDUR) 120 MG 24 hr tablet [id number] Yes TAKE 1 TABLET BY MOUTH ONCE DAILY IN [place] Batman, Kirsten, MD Taking Active   lactulose [facility]) 10 GM/15ML solution [id number] Yes TAKE 30MLS BY MOUTH IN THE MORNING AND AT BEDTIME Cox, Kirsten, MD Taking Active   levocetirizine (XYZAL) 5 MG tablet [id number] No Take 1 tablet (5 mg total) by mouth every evening.  Patient not taking: Reported on 01/08/2021   Rochel Brome, MD Not Taking Active   metolazone (ZAROXOLYN) 2.5 MG tablet [id number] Yes TAKE 1 TABLET BY MOUTH DAILY 3 HOURS PRIOR TO LASIX DAILY Lillard Anes, MD Taking Active   metoprolol succinate (TOPROL-XL) 50 MG 24 hr tablet [id number] Yes Take 1 tablet (50 mg total) by mouth daily. Take with or immediately following a meal. Cox, Kirsten, MD Taking Active   mometasone-formoterol [facility]) 200-5 MCG/ACT Hollie Salk [id number] Yes Inhale 2 puffs into the lungs 2 (two) times daily as needed for wheezing. [provider] Taking Active Self  MOVANTIK 25 MG TABS tablet [id number] No TAKE 1 TABLET BY MOUTH DAILY  Patient not taking: Reported on 01/08/2021   CoxElnita Maxwell, MD Not Taking Active   nitroGLYCERIN (NITROSTAT) 0.4 MG SL tablet [id number] Yes Place 0.4 mg under the tongue every 5 (five) minutes as needed for chest pain.  [provider] Taking Active Self           Med Note Stevan Born   Tue Apr 07, 2017  3:39 PM)    OXYGEN  [id number] Yes Inhale into the lungs. 2 liters at bedtime [provider] Taking Active Self  pantoprazole (PROTONIX) 40 MG tablet [id number] Yes  Take 1 tablet (40 mg total) by mouth in the morning. Cox, Kirsten, MD Taking Active   potassium chloride SA (KLOR-CON) 20 MEQ tablet [id number] Yes Take 3 tablets (60 mEq total) by mouth 2 (two) times daily. Rochel Brome, MD Taking Active   pregabalin (LYRICA) 75 MG capsule [id number] Yes TAKE 1 CAPSULE BY MOUTH TWICE DAILY Lillard Anes, MD Taking Active   Rivaroxaban (XARELTO) 15 MG TABS tablet [id number] Yes Take 1 tablet (15 mg total) by mouth at bedtime. Cox, Kirsten, MD Taking Active   Semaglutide, 1 MG/DOSE, (OZEMPIC, 1 MG/DOSE,) 4 MG/3ML Bon[place] Aid [id number] Yes INJECT $RemoveBe'1MG'vAyPlffTj$  INTO THE SKIN ONCE A WEEK Cox, Kirsten, MD Taking Active   sertraline (ZOLOFT) 50 MG tablet [id number] Yes Take 1 tablet (50 mg total) by mouth daily. Cox, Kirsten, MD Taking Active   tamsulosin (FLOMAX) 0.4 MG CAPS capsule [id number] Yes TAKE 2 CAPSULES BY MOUTH DAILY AFTER SUPPER Cox, Kirsten, MD Taking Active   torsemide (DEMADEX) 20 MG tablet [id number] Yes TAKE 3 TABLETS BY MOUTH AT LUNCH AND TAKE 3 TABLETS AT SUPPER Cox, Kirsten, MD Taking Active   Vitamin D, Ergocalciferol, (DRISDOL) 1.25 MG (50000 UNIT) CAPS capsule [id number] Yes Take 1 capsule (50,000 Units total) by mouth once a week. Rochel Brome, MD Taking Active   XTAMPZA ER 27 MG C12A [id number] Yes Take 1 capsule by mouth 2 (two) times daily. CoxElnita Maxwell, MD Taking Active           Patient Active Problem List   Diagnosis Date Noted  . Secondary hyperparathyroidism of renal origin ([place]) 12/04/2020  . Peripheral vascular disease ([place])   . Pericardial effusion   . On home oxygen therapy   . Obesity (BMI 30-39.9)   . Neuromuscular disorder ([place])   . Myocardial infarction ([place])   . Hypertension   . Hyperlipemia   . History of stroke   . H/O hiatal hernia   . Fibromyalgia   . Dysrhythmia   . CHF  (congestive heart failure) ([place])   . Atrial fibrillation ([place])   . Asthma   . Arthritis   . Cholera screening 07/05/2020  . Acute sore throat 06/08/2020  . Cough 06/08/2020  . Diabetic ulcer of toe of right foot associated with type 2 diabetes mellitus, with necrosis of bone ([place])   . Fall 03/07/2020  . Closed fracture of multiple carpal bones 03/07/2020  . Chronic respiratory failure with hypoxia ([place]) 02/11/2020  . Morbid obesity with BMI of 40.0-44.9, adult ([place]) 02/11/2020  . Moderate recurrent major depression ([place]) 02/11/2020  . Class 3 severe obesity in adult [facility]) 12/30/2019  . Uncomplicated opioid dependence ([place]) 12/17/2019  . Anemia of chronic disease 12/17/2019  . Ulcers of both lower legs ([place]) 12/17/2019  . Acquired thrombophilia ([place]) 12/17/2019  . Acute venous embolism and thrombosis of deep vessels of distal lower extremity ([place]) 11/13/2019  . Vitamin D insufficiency 10/11/2019  . PVD (peripheral vascular disease) ([place]) 09/28/2018  . DM (diabetes mellitus), type 2 with renal complications ([place]) 56/97/9480  . Drug induced constipation 11/10/2017  . Chronic pain of both knees 03/18/2017  . Primary osteoarthritis of both knees 03/18/2017  . Preoperative cardiovascular examination 02/24/2016  . Hemoptysis 08/01/2015  . Herpes zoster 06/26/2015  . PNA (pneumonia) 04/10/2015  . Chronic respiratory failure ([place]) 04/10/2015  . Chronic diastolic heart failure ([place]) 02/22/2015  . LBBB (left bundle branch block) 02/22/2015  . Peritoneal effusion, chronic 02/22/2015  . Mild CAD 02/20/2015  . Precordial pain 01/26/2015  .  OSA on CPAP 01/25/2015  . Back pain 01/25/2015  . COPD (chronic obstructive pulmonary disease) ([place]) 01/25/2015  . Abnormal EKG 11/29/2014  . Normal coronary arteries 2011 11/29/2014  . Troponin level elevated 11/29/2014  . Hyperkalemia-repeat pending 11/29/2014  . Acute renal insufficiency 11/29/2014  . Long term (current) use of anticoagulants  03/21/2014  . CVA (cerebral vascular accident) ([place]) 02/09/2014  . Chronic anticoagulation-Xarelto 02/09/2014  . BOOP (bronchiolitis obliterans with organizing pneumonia) ([place]) 01/20/2014  . Chronic atrial fibrillation ([place]) 01/03/2014  . Pneumonia 12/2013  . Diabetic polyneuropathy associated with type 2 diabetes mellitus ([place]) 12/21/2013  . Uncontrolled type 2 diabetes mellitus with diabetic polyneuropathy, with long-term current use of insulin ([place]) 12/21/2013  . Chronic bronchitis ([place]) 12/21/2013  . Hypothyroidism   . Hypertensive heart disease with heart failure ([place])   . Restless leg syndrome   . GERD (gastroesophageal reflux disease)   . Sleep apnea   . Hyperlipidemia   . Neuropathy   . 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 Stroke [facility]) 2012    Immunization History  Administered Date(s) Administered  . Influenza Inj Mdck Quad Pf 05/02/2020  . Influenza Split 10/22/2013  . Influenza,inj,Quad PF,6+ Mos 06/11/2015, 06/01/2016  . PFIZER(Purple Top)SARS-COV-2 Vaccination 12/13/2019, 01/13/2020, 07/05/2020  . Pneumococcal Conjugate-13 06/03/2016  . Pneumococcal-Unspecified 11/30/2013  . Tdap 08/30/2012    Conditions to be addressed/monitored:  Hyperlipidemia, Diabetes, Heart Failure, Chronic Kidney Disease and Depression  Care Plan : [place]  Updates made by Burnice Logan, [place] since 01/09/2021 12:00 AM    Problem: CAD, HTN, CHF, Diabetes   Priority: High  Onset Date: 10/18/2020    Long-Range Goal: Disease Management   Start Date: 10/18/2020  Expected End Date: 10/18/2021  Recent Progress: On track  Priority: High  Note:   Current Barriers:  . Unable to achieve control of diabetes  . Transportation unavailable currently for patient to attend visit.   Pharmacist Clinical Goal(s):  Marland Kitchen Over the next 90 days, patient will achieve adherence to monitoring guidelines and medication adherence to achieve therapeutic efficacy . achieve control of diabetes as  evidenced by a1c and blood glucose through collaboration with PharmD and provider.   Interventions: . 1:1 collaboration with Rochel Brome, MD regarding development and update of comprehensive plan of care as evidenced by provider attestation and co-signature . Inter-disciplinary care team collaboration (see longitudinal plan of care) . Comprehensive medication review performed; medication list updated in electronic medical record  Hyperlipidemia: (LDL goal < 55) -controlled -Current treatment: . Atorvastatin 40 mg daily  -Medications previously tried:  None reported  -Current dietary patterns: eats 2 meals daily. States he has no control over meal choices. Family prepares.  -Current exercise habits: no formal exercise.  -Educated on Cholesterol goals;  Benefits of statin for ASCVD risk reduction; -Recommended to continue current medication  Diabetes (A1c goal <7%) -uncontrolled -Current medications: . Dexcom . Glucagon emergency kit . Levemir 26 units daily  . Novolin R flex pen sliding scale before meals . Ozempic 1 mg weekly   -Medications previously tried: metformin, novolog, humalog  -Current home glucose readings . fasting glucose: 89-135 . post prandial glucose: patient reports bedtime readings near 500  -Denies hypoglycemic/hyperglycemic symptoms -Current meal patterns:  . Patient states that he eats 2 meals daily - doesn't have a say in what is prepared/served. Sometimes family only provides 1 meal daily.  -Current exercise: none reported -Educated onA1c and blood sugar goals; Complications of diabetes including kidney damage, retinal damage,  and cardiovascular disease; Benefits of weight loss; Benefits of routine self-monitoring of blood sugar; Continuous glucose monitoring; -Counseled to check feet daily and get yearly eye exams -Recommended to consider increase in Ozempic to 2 mg daily  Educated on Dexcom - coordinated appointment with next in office visit to set  up [facility] 01/30/2021.    Atrial Fibrillation (Goal: prevent stroke and major bleeding) -controlled  -CHADSVASC: CHA2DS2-VASc Score = 7   -Current treatment: . Rate control: metoprolol xl 50 mg daily  . 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 Can't control food options since he doesn't prepare his own meals.  -Current exercise routine: none reported -Educated on Benefits of medications for managing symptoms and prolonging life Importance of blood pressure control -Counseled on diet and exercise extensively Recommended to continue current medication and schedule visit with Nephrology. Pharmacist discussed with referral coordinator. Patient has been contacted multiple times to schedule and has not responded. Pharmacist urged patient to call and schedule appointment.   Patient Goals/Self-Care Activities . Over the next 90 days, patient will:  - take medications as prescribed focus on medication adherence by using pill box check glucose four times daily, document, and provide at future appointments Schedule visit with nephrology.   Follow Up Plan: Telephone follow up appointment with care  management team member scheduled for: 01/30/2021      Medication Assistance: None required.  Patient affirms current coverage meets needs.  Patient's preferred pharmacy is:  [facility], [address] [place] [address] Phone: [phone] Fax: [place] (New Address) - [place], [address] AT Previously: [address], [address] [place] [address] Phone: [phone] Fax: [phone]  Uses pill box? Yes Pt endorses fair compliance  We discussed: Benefits of medication synchronization, packaging and delivery as well as enhanced pharmacist oversight with [employer]. Patient decided to: Continue current medication management strategy  Care Plan and Follow Up Patient Decision:  Patient agrees to Care Plan and Follow-up.  Plan: Telephone follow up appointment with care management team member scheduled for:  01/30/2021   Pharmacy assistant will follow-up with patient each month. Patient will contact pharmacist if needed sooner as well.

## 2021-01-08 ENCOUNTER — Other Ambulatory Visit: Payer: Self-pay

## 2021-01-08 ENCOUNTER — Ambulatory Visit (INDEPENDENT_AMBULATORY_CARE_PROVIDER_SITE_OTHER): Payer: Medicare Other

## 2021-01-08 DIAGNOSIS — I129 Hypertensive chronic kidney disease with stage 1 through stage 4 chronic kidney disease, or unspecified chronic kidney disease: Secondary | ICD-10-CM

## 2021-01-08 DIAGNOSIS — E1142 Type 2 diabetes mellitus with diabetic polyneuropathy: Secondary | ICD-10-CM

## 2021-01-08 DIAGNOSIS — E1140 Type 2 diabetes mellitus with diabetic neuropathy, unspecified: Secondary | ICD-10-CM

## 2021-01-08 DIAGNOSIS — E1165 Type 2 diabetes mellitus with hyperglycemia: Secondary | ICD-10-CM

## 2021-01-08 DIAGNOSIS — Z794 Long term (current) use of insulin: Secondary | ICD-10-CM

## 2021-01-08 DIAGNOSIS — N184 Chronic kidney disease, stage 4 (severe): Secondary | ICD-10-CM | POA: Diagnosis not present

## 2021-01-08 MED ORDER — HYDROXYZINE HCL 50 MG PO TABS: 50.0000 mg | ORAL_TABLET | Freq: Three times a day (TID) | ORAL | 1 refills | Status: DC | PRN

## 2021-01-08 MED ORDER — LEVEMIR FLEXTOUCH 100 UNIT/ML ~~LOC~~ SOPN: 40.0000 [IU] | PEN_INJECTOR | Freq: Every day | SUBCUTANEOUS | Status: DC

## 2021-01-09 ENCOUNTER — Other Ambulatory Visit: Payer: Self-pay

## 2021-01-09 DIAGNOSIS — I872 Venous insufficiency (chronic) (peripheral): Secondary | ICD-10-CM | POA: Diagnosis not present

## 2021-01-09 DIAGNOSIS — G894 Chronic pain syndrome: Secondary | ICD-10-CM | POA: Diagnosis not present

## 2021-01-09 DIAGNOSIS — I5032 Chronic diastolic (congestive) heart failure: Secondary | ICD-10-CM | POA: Diagnosis not present

## 2021-01-09 DIAGNOSIS — J449 Chronic obstructive pulmonary disease, unspecified: Secondary | ICD-10-CM | POA: Diagnosis not present

## 2021-01-09 DIAGNOSIS — L89320 Pressure ulcer of left buttock, unstageable: Secondary | ICD-10-CM | POA: Diagnosis not present

## 2021-01-09 DIAGNOSIS — E1142 Type 2 diabetes mellitus with diabetic polyneuropathy: Secondary | ICD-10-CM | POA: Diagnosis not present

## 2021-01-09 DIAGNOSIS — E1122 Type 2 diabetes mellitus with diabetic chronic kidney disease: Secondary | ICD-10-CM | POA: Diagnosis not present

## 2021-01-09 DIAGNOSIS — J42 Unspecified chronic bronchitis: Secondary | ICD-10-CM | POA: Diagnosis not present

## 2021-01-09 DIAGNOSIS — E782 Mixed hyperlipidemia: Secondary | ICD-10-CM | POA: Diagnosis not present

## 2021-01-09 DIAGNOSIS — L03317 Cellulitis of buttock: Secondary | ICD-10-CM | POA: Diagnosis not present

## 2021-01-09 DIAGNOSIS — I130 Hypertensive heart and chronic kidney disease with heart failure and stage 1 through stage 4 chronic kidney disease, or unspecified chronic kidney disease: Secondary | ICD-10-CM | POA: Diagnosis not present

## 2021-01-09 DIAGNOSIS — R338 Other retention of urine: Secondary | ICD-10-CM | POA: Diagnosis not present

## 2021-01-09 DIAGNOSIS — K219 Gastro-esophageal reflux disease without esophagitis: Secondary | ICD-10-CM | POA: Diagnosis not present

## 2021-01-09 DIAGNOSIS — E1140 Type 2 diabetes mellitus with diabetic neuropathy, unspecified: Secondary | ICD-10-CM

## 2021-01-09 DIAGNOSIS — I482 Chronic atrial fibrillation, unspecified: Secondary | ICD-10-CM | POA: Diagnosis not present

## 2021-01-09 DIAGNOSIS — I82409 Acute embolism and thrombosis of unspecified deep veins of unspecified lower extremity: Secondary | ICD-10-CM | POA: Diagnosis not present

## 2021-01-09 DIAGNOSIS — M797 Fibromyalgia: Secondary | ICD-10-CM | POA: Diagnosis not present

## 2021-01-09 DIAGNOSIS — E039 Hypothyroidism, unspecified: Secondary | ICD-10-CM | POA: Diagnosis not present

## 2021-01-09 DIAGNOSIS — I252 Old myocardial infarction: Secondary | ICD-10-CM | POA: Diagnosis not present

## 2021-01-09 DIAGNOSIS — E1151 Type 2 diabetes mellitus with diabetic peripheral angiopathy without gangrene: Secondary | ICD-10-CM | POA: Diagnosis not present

## 2021-01-09 DIAGNOSIS — L0231 Cutaneous abscess of buttock: Secondary | ICD-10-CM | POA: Diagnosis not present

## 2021-01-09 DIAGNOSIS — G4733 Obstructive sleep apnea (adult) (pediatric): Secondary | ICD-10-CM | POA: Diagnosis not present

## 2021-01-09 DIAGNOSIS — N1832 Chronic kidney disease, stage 3b: Secondary | ICD-10-CM | POA: Diagnosis not present

## 2021-01-09 MED ORDER — XTAMPZA ER 27 MG PO C12A: 1.0000 | EXTENDED_RELEASE_CAPSULE | Freq: Two times a day (BID) | ORAL | 0 refills | Status: DC

## 2021-01-09 MED ORDER — LEVEMIR FLEXTOUCH 100 UNIT/ML ~~LOC~~ SOPN
40.0000 [IU] | PEN_INJECTOR | Freq: Every day | SUBCUTANEOUS | Status: DC
Start: 2021-01-09 — End: 2021-01-14

## 2021-01-09 NOTE — Patient Instructions (Addendum)
Visit Information  Goals Addressed            This Visit's Progress   . Learn More About My Health   On track    Timeframe:  Long-Range Goal Priority:  High Start Date:        10/18/2020                     Expected End Date:          10/18/2021              Follow Up Date 01/08/2021    - make a list of questions - ask questions - bring a list of my medicines to the visit - speak up when I don't understand    Why is this important?    The best way to learn about your health and care is by talking to the doctor and nurse.   They will answer your questions and give you information in the way that you like best.    Notes:     Marland Kitchen Manage My Medicine   On track    Timeframe:  Long-Range Goal Priority:  High Start Date:             10/18/2020                Expected End Date:               10/18/2021        Follow Up Date 01/08/2021    - call for medicine refill 2 or 3 days before it runs out - keep a list of all the medicines I take; vitamins and herbals too - use a pillbox to sort medicine    Why is this important?   . These steps will help you keep on track with your medicines.   Notes:     . Track and Manage Symptoms-Heart Failure   On track    Timeframe:  Long-Range Goal Priority:  High Start Date:            10/18/2020                 Expected End Date:       10/18/2021                Follow Up Date 01/08/2021    - eat more whole grains, fruits and vegetables, lean meats and healthy fats - know when to call the doctor - track symptoms and what helps feel better or worse    Why is this important?    You will be able to handle your symptoms better if you keep track of them.   Making some simple changes to your lifestyle will help.   Eating healthy is one thing you can do to take good care of yourself.    Notes:       Patient Care Plan: CCM Pharmacy Care Plan    Problem Identified: CAD, HTN, CHF, Diabetes   Priority: High  Onset Date: 10/18/2020     Long-Range Goal: Disease Management   Start Date: 10/18/2020  Expected End Date: 10/18/2021  Recent Progress: On track  Priority: High  Note:   Current Barriers:  . Unable to achieve control of diabetes  . Transportation unavailable currently for patient to attend visit.   Pharmacist Clinical Goal(s):  Marland Kitchen Over the next 90 days, patient will achieve adherence to monitoring guidelines and medication adherence to achieve therapeutic efficacy .  achieve control of diabetes as evidenced by a1c and blood glucose through collaboration with PharmD and provider.   Interventions: . 1:1 collaboration with Rochel Brome, MD regarding development and update of comprehensive plan of care as evidenced by provider attestation and co-signature . Inter-disciplinary care team collaboration (see longitudinal plan of care) . Comprehensive medication review performed; medication list updated in electronic medical record  Hyperlipidemia: (LDL goal < 55) -controlled -Current treatment: . Atorvastatin 40 mg daily  -Medications previously tried:  None reported  -Current dietary patterns: eats 2 meals daily. States he has no control over meal choices. Family prepares.  -Current exercise habits: no formal exercise.  -Educated on Cholesterol goals;  Benefits of statin for ASCVD risk reduction; -Recommended to continue current medication  Diabetes (A1c goal <7%) -uncontrolled -Current medications: . Dexcom . Glucagon emergency kit . Levemir 26 units daily  . Novolin R flex pen sliding scale before meals . Ozempic 1 mg weekly   -Medications previously tried: metformin, novolog, humalog  -Current home glucose readings . fasting glucose: 89-135 . post prandial glucose: patient reports bedtime readings near 500  -Denies hypoglycemic/hyperglycemic symptoms -Current meal patterns:  . Patient states that he eats 2 meals daily - doesn't have a say in what is prepared/served. Sometimes family only provides 1 meal  daily.  -Current exercise: none reported -Educated onA1c and blood sugar goals; Complications of diabetes including kidney damage, retinal damage, and cardiovascular disease; Benefits of weight loss; Benefits of routine self-monitoring of blood sugar; Continuous glucose monitoring; -Counseled to check feet daily and get yearly eye exams -Recommended to consider increase in Ozempic to 2 mg daily  Educated on Dexcom - coordinated appointment with next in office visit to set up [facility] 01/30/2021.    Atrial Fibrillation (Goal: prevent stroke and major bleeding) -controlled  -CHADSVASC: CHA2DS2-VASc Score = 7   -Current treatment: . Rate control: metoprolol xl 50 mg daily  . 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 Can't control food options since he doesn't prepare his own meals.  -Current exercise routine: none reported -Educated on Benefits of medications for managing symptoms and prolonging life Importance of blood pressure control -Counseled on diet and exercise extensively Recommended to continue current medication and schedule visit with Nephrology. Pharmacist discussed with referral  coordinator. Patient has been contacted multiple times to schedule and has not responded. Pharmacist urged patient to call and schedule appointment.   Patient Goals/Self-Care Activities . Over the next 90 days, patient will:  - take medications as prescribed focus on medication adherence by using pill box check glucose four times daily, document, and provide at future appointments Schedule visit with nephrology.   Follow Up Plan: Telephone follow up appointment with care management team member scheduled for: 01/30/2021      The patient verbalized understanding of  instructions, educational materials, and care plan provided today and declined offer to receive copy of patient instructions, educational materials, and care plan.  Face to Face appointment with pharmacist scheduled for:  01/30/2021  Burnice Logan, [facility]  Daily Diabetes Mellitus Record  Check your blood glucose (BG) as told by your health care provider. Use this form to record your BG results as well as any diabetes medicines that you take, including insulin. A record of your BG results and a list of your current medicines are very helpful in managing your diabetes. Your BG numbers help your health care provider know whether your diabetes management plan needs to be changed. Patient name: ____________________________________ Week of ____________________ Daily BG results and diabetes medicines Date: _________  Breakfast - BG / Medicines: ________________ / __________________________________________________________  Lunch - BG / Medicines: ___________________ / __________________________________________________________  Dinner - BG / Medicines: __________________ / __________________________________________________________  Bedtime - BG / Medicines: ________________ / ___________________________________________________________ Date: _________  Breakfast - BG / Medicines: ________________ /  __________________________________________________________  Lunch - BG / Medicines: ___________________ / __________________________________________________________  Dinner - BG / Medicines: __________________ / __________________________________________________________  Bedtime - BG / Medicines: ________________ / ___________________________________________________________ Date: _________  Breakfast - BG / Medicines: ________________ / __________________________________________________________  Lunch - BG / Medicines: ___________________ / __________________________________________________________  Dinner - BG / Medicines: __________________ / __________________________________________________________  Bedtime - BG / Medicines: ________________ / ___________________________________________________________ Date: _________  Breakfast - BG / Medicines: ________________ / __________________________________________________________  Lunch - BG / Medicines: ___________________ / __________________________________________________________  Dinner - BG / Medicines: __________________ / __________________________________________________________  Bedtime - BG / Medicines: ________________ / ___________________________________________________________ Date: _________  Breakfast - BG / Medicines: ________________ / __________________________________________________________  Lunch - BG / Medicines: ___________________ / __________________________________________________________  Dinner - BG / Medicines: __________________ / __________________________________________________________  Bedtime - BG / Medicines: ________________ / ___________________________________________________________ Date: _________  Breakfast - BG / Medicines: ________________ / __________________________________________________________  Lunch - BG / Medicines: ___________________ /  __________________________________________________________  Dinner - BG / Medicines: __________________ / __________________________________________________________  Bedtime - BG / Medicines: ________________ / ___________________________________________________________ Date: _________  Breakfast - BG / Medicines: ________________ / __________________________________________________________  Lunch - BG / Medicines: ___________________ / __________________________________________________________  Dinner - BG / Medicines: __________________ / __________________________________________________________  Bedtime - BG / Medicines: ________________ / ___________________________________________________________ Notes: ______________________________________________________________________________________________________________________ This information is not intended to replace advice given to you by your health care provider. Make sure you discuss any questions you have with your health care provider. Document Revised: 02/23/2020 Document Reviewed: 02/23/2020 Elsevier Patient Education  [place].

## 2021-01-13 ENCOUNTER — Other Ambulatory Visit: Payer: Self-pay | Admitting: Family Medicine

## 2021-01-13 MED ORDER — OZEMPIC (1 MG/DOSE) 2 MG/1.5ML ~~LOC~~ SOPN: 1.0000 mg | PEN_INJECTOR | SUBCUTANEOUS | 3 refills | Status: DC

## 2021-01-14 ENCOUNTER — Other Ambulatory Visit: Payer: Self-pay

## 2021-01-14 ENCOUNTER — Telehealth: Payer: Self-pay

## 2021-01-14 DIAGNOSIS — E1142 Type 2 diabetes mellitus with diabetic polyneuropathy: Secondary | ICD-10-CM

## 2021-01-14 DIAGNOSIS — E1140 Type 2 diabetes mellitus with diabetic neuropathy, unspecified: Secondary | ICD-10-CM

## 2021-01-14 DIAGNOSIS — Z794 Long term (current) use of insulin: Secondary | ICD-10-CM

## 2021-01-14 MED ORDER — LEVEMIR FLEXTOUCH 100 UNIT/ML ~~LOC~~ SOPN: 40.0000 [IU] | PEN_INJECTOR | Freq: Every day | SUBCUTANEOUS | Status: DC

## 2021-01-14 MED ORDER — LEVEMIR FLEXTOUCH 100 UNIT/ML ~~LOC~~ SOPN: 40.0000 [IU] | PEN_INJECTOR | Freq: Every day | SUBCUTANEOUS | 2 refills | Status: DC

## 2021-01-14 MED ORDER — XTAMPZA ER 27 MG PO C12A: 1.0000 | EXTENDED_RELEASE_CAPSULE | Freq: Two times a day (BID) | ORAL | 0 refills | Status: DC

## 2021-01-14 NOTE — Progress Notes (Signed)
Chronic Care Management Pharmacy Assistant   Name: ESLI JERNIGAN  MRN: [id number] DOB: 10-17-1950   Reason for Encounter: Medication Review Ozempic changed to 74m/weekly     Medications: Outpatient Encounter Medications as of 01/14/2021  Medication Sig  . albuterol (PROVENTIL) (2.5 MG/3ML) 0.083% nebulizer solution Take 3 mLs (2.5 mg total) by nebulization 2 (two) times daily as needed for wheezing.  .Marland Kitchenatorvastatin (LIPITOR) 40 MG tablet Take 1 tablet (40 mg total) by mouth daily.  . Azelastine HCl 0.15 % SOLN USE 1 SPRAY IN EACH NOSTRIL TWICE DAILY  . clopidogrel (PLAVIX) 75 MG tablet Take 1 tablet (75 mg total) by mouth daily.  . Continuous Blood Gluc Receiver (DEXCOM G6 RECEIVER) DEVI 1 each by Does not apply route 4 (four) times daily -  before meals and at bedtime. (Patient not taking: Reported on 01/08/2021)  . Continuous Blood Gluc Sensor (DEXCOM G6 SENSOR) MISC Apply every 10 days (Patient not taking: No sig reported)  . diltiazem (CARDIZEM CD) 120 MG 24 hr capsule Take 1 capsule (120 mg total) by mouth daily.  .Marland Kitchendocusate sodium (COLACE) 100 MG capsule Take 100 mg by mouth 2 (two) times daily.  . famotidine (PEPCID) 40 MG tablet Take 1 tablet (40 mg total) by mouth 2 (two) times daily.  . ferrous sulfate 325 (65 FE) MG tablet Take 325 mg by mouth daily.  . Glucagon, rDNA, (GLUCAGON EMERGENCY) 1 MG KIT Inject 1 mg as directed as needed (low blood sugar).   . hydrALAZINE (APRESOLINE) 100 MG tablet TAKE ONE (1) TABLET BY MOUTH 3 TIMES DAILY. DISCONTINE DILTIAZEM.  .Marland KitchenhydrOXYzine (ATARAX/VISTARIL) 50 MG tablet Take 1 tablet (50 mg total) by mouth 3 (three) times daily as needed.  . insulin detemir (LEVEMIR FLEXTOUCH) 100 UNIT/ML FlexPen Inject 40 Units into the skin daily.  . insulin lispro (HUMALOG KWIKPEN) 100 UNIT/ML KwikPen PER SLIDING SCALE ADMINSTER AS FOLLOWS: >150-200 2 U,201-250 4 U,251-300 8 U, 301-350 12 U,351-400 16 U,>400 20 U  . Insulin Pen Needle (BD PEN NEEDLE NANO  2ND GEN) 32G X 4 MM MISC USE DAILY WITH BASAGLAR  . isosorbide mononitrate (IMDUR) 120 MG 24 hr tablet TAKE 1 TABLET BY MOUTH ONCE DAILY IN THEMORNINGS  . lactulose (CHRONULAC) 10 GM/15ML solution TAKE 30MLS BY MOUTH IN THE MORNING AND AT BEDTIME  . metolazone (ZAROXOLYN) 2.5 MG tablet TAKE 1 TABLET BY MOUTH DAILY 3 HOURS PRIOR TO LASIX DAILY  . metoprolol succinate (TOPROL-XL) 50 MG 24 hr tablet Take 1 tablet (50 mg total) by mouth daily. Take with or immediately following a meal.  . mometasone-formoterol (DULERA) 200-5 MCG/ACT AERO Inhale 2 puffs into the lungs 2 (two) times daily as needed for wheezing.  . nitroGLYCERIN (NITROSTAT) 0.4 MG SL tablet Place 0.4 mg under the tongue every 5 (five) minutes as needed for chest pain.   . OXYGEN Inhale into the lungs. 2 liters at bedtime  . pantoprazole (PROTONIX) 40 MG tablet Take 1 tablet (40 mg total) by mouth in the morning.  . potassium chloride SA (KLOR-CON) 20 MEQ tablet Take 3 tablets (60 mEq total) by mouth 2 (two) times daily.  . pregabalin (LYRICA) 75 MG capsule TAKE 1 CAPSULE BY MOUTH TWICE DAILY  . Rivaroxaban (XARELTO) 15 MG TABS tablet Take 1 tablet (15 mg total) by mouth at bedtime.  . Semaglutide, 1 MG/DOSE, (OZEMPIC, 1 MG/DOSE,) 2 MG/1.5ML SOPN Inject 1 mg into the skin once a week.  . sertraline (ZOLOFT) 50 MG tablet Take  1 tablet (50 mg total) by mouth daily.  . tamsulosin (FLOMAX) 0.4 MG CAPS capsule TAKE 2 CAPSULES BY MOUTH DAILY AFTER SUPPER  . torsemide (DEMADEX) 20 MG tablet TAKE 3 TABLETS BY MOUTH AT LUNCH AND TAKE 3 TABLETS AT SUPPER  . Vitamin D, Ergocalciferol, (DRISDOL) 1.25 MG (50000 UNIT) CAPS capsule Take 1 capsule (50,000 Units total) by mouth once a week.  Marland Kitchen XTAMPZA ER 27 MG C12A Take 1 capsule by mouth 2 (two) times daily.   No facility-administered encounter medications on file as of 01/14/2021.    Donette Larry, CPP asked me to call patient and let him know Dr. Tobie Poet had approved increase in Ozempic to 74m/weekly, and  new script was sent in.  I called patient and left message.   TClarita Leber CMonticelloPharmacist Assistant [phone]

## 2021-01-14 NOTE — Telephone Encounter (Signed)
Daughter and pharmacy state it was never received. Sending again  [place], [place] 01/14/21 3:12 PM

## 2021-01-16 ENCOUNTER — Other Ambulatory Visit: Payer: Self-pay | Admitting: Family Medicine

## 2021-01-16 DIAGNOSIS — N2581 Secondary hyperparathyroidism of renal origin: Secondary | ICD-10-CM | POA: Diagnosis not present

## 2021-01-16 DIAGNOSIS — I480 Paroxysmal atrial fibrillation: Secondary | ICD-10-CM | POA: Diagnosis not present

## 2021-01-16 DIAGNOSIS — I129 Hypertensive chronic kidney disease with stage 1 through stage 4 chronic kidney disease, or unspecified chronic kidney disease: Secondary | ICD-10-CM | POA: Diagnosis not present

## 2021-01-16 DIAGNOSIS — N189 Chronic kidney disease, unspecified: Secondary | ICD-10-CM | POA: Diagnosis not present

## 2021-01-16 DIAGNOSIS — N184 Chronic kidney disease, stage 4 (severe): Secondary | ICD-10-CM | POA: Diagnosis not present

## 2021-01-16 DIAGNOSIS — D631 Anemia in chronic kidney disease: Secondary | ICD-10-CM | POA: Diagnosis not present

## 2021-01-16 DIAGNOSIS — K219 Gastro-esophageal reflux disease without esophagitis: Secondary | ICD-10-CM | POA: Diagnosis not present

## 2021-01-16 DIAGNOSIS — E785 Hyperlipidemia, unspecified: Secondary | ICD-10-CM | POA: Diagnosis not present

## 2021-01-16 DIAGNOSIS — E1122 Type 2 diabetes mellitus with diabetic chronic kidney disease: Secondary | ICD-10-CM | POA: Diagnosis not present

## 2021-01-16 MED ORDER — TORSEMIDE 20 MG PO TABS: ORAL_TABLET | ORAL | 0 refills | Status: DC

## 2021-01-18 DIAGNOSIS — E1122 Type 2 diabetes mellitus with diabetic chronic kidney disease: Secondary | ICD-10-CM | POA: Diagnosis not present

## 2021-01-18 DIAGNOSIS — G4733 Obstructive sleep apnea (adult) (pediatric): Secondary | ICD-10-CM | POA: Diagnosis not present

## 2021-01-18 DIAGNOSIS — L0231 Cutaneous abscess of buttock: Secondary | ICD-10-CM | POA: Diagnosis not present

## 2021-01-18 DIAGNOSIS — I130 Hypertensive heart and chronic kidney disease with heart failure and stage 1 through stage 4 chronic kidney disease, or unspecified chronic kidney disease: Secondary | ICD-10-CM | POA: Diagnosis not present

## 2021-01-18 DIAGNOSIS — G894 Chronic pain syndrome: Secondary | ICD-10-CM | POA: Diagnosis not present

## 2021-01-18 DIAGNOSIS — L89320 Pressure ulcer of left buttock, unstageable: Secondary | ICD-10-CM | POA: Diagnosis not present

## 2021-01-18 DIAGNOSIS — N1832 Chronic kidney disease, stage 3b: Secondary | ICD-10-CM | POA: Diagnosis not present

## 2021-01-18 DIAGNOSIS — J42 Unspecified chronic bronchitis: Secondary | ICD-10-CM | POA: Diagnosis not present

## 2021-01-18 DIAGNOSIS — I5032 Chronic diastolic (congestive) heart failure: Secondary | ICD-10-CM | POA: Diagnosis not present

## 2021-01-18 DIAGNOSIS — I482 Chronic atrial fibrillation, unspecified: Secondary | ICD-10-CM | POA: Diagnosis not present

## 2021-01-18 DIAGNOSIS — E1142 Type 2 diabetes mellitus with diabetic polyneuropathy: Secondary | ICD-10-CM | POA: Diagnosis not present

## 2021-01-18 DIAGNOSIS — E1151 Type 2 diabetes mellitus with diabetic peripheral angiopathy without gangrene: Secondary | ICD-10-CM | POA: Diagnosis not present

## 2021-01-18 DIAGNOSIS — L03317 Cellulitis of buttock: Secondary | ICD-10-CM | POA: Diagnosis not present

## 2021-01-18 DIAGNOSIS — R338 Other retention of urine: Secondary | ICD-10-CM | POA: Diagnosis not present

## 2021-01-18 DIAGNOSIS — J449 Chronic obstructive pulmonary disease, unspecified: Secondary | ICD-10-CM | POA: Diagnosis not present

## 2021-01-18 DIAGNOSIS — I252 Old myocardial infarction: Secondary | ICD-10-CM | POA: Diagnosis not present

## 2021-01-18 DIAGNOSIS — K219 Gastro-esophageal reflux disease without esophagitis: Secondary | ICD-10-CM | POA: Diagnosis not present

## 2021-01-18 DIAGNOSIS — I872 Venous insufficiency (chronic) (peripheral): Secondary | ICD-10-CM | POA: Diagnosis not present

## 2021-01-18 DIAGNOSIS — E039 Hypothyroidism, unspecified: Secondary | ICD-10-CM | POA: Diagnosis not present

## 2021-01-18 DIAGNOSIS — M797 Fibromyalgia: Secondary | ICD-10-CM | POA: Diagnosis not present

## 2021-01-18 DIAGNOSIS — I82409 Acute embolism and thrombosis of unspecified deep veins of unspecified lower extremity: Secondary | ICD-10-CM | POA: Diagnosis not present

## 2021-01-18 DIAGNOSIS — E782 Mixed hyperlipidemia: Secondary | ICD-10-CM | POA: Diagnosis not present

## 2021-01-22 DIAGNOSIS — J9610 Chronic respiratory failure, unspecified whether with hypoxia or hypercapnia: Secondary | ICD-10-CM | POA: Diagnosis not present

## 2021-01-23 DIAGNOSIS — E1122 Type 2 diabetes mellitus with diabetic chronic kidney disease: Secondary | ICD-10-CM | POA: Diagnosis not present

## 2021-01-23 DIAGNOSIS — J449 Chronic obstructive pulmonary disease, unspecified: Secondary | ICD-10-CM | POA: Diagnosis not present

## 2021-01-23 DIAGNOSIS — J42 Unspecified chronic bronchitis: Secondary | ICD-10-CM | POA: Diagnosis not present

## 2021-01-23 DIAGNOSIS — M797 Fibromyalgia: Secondary | ICD-10-CM | POA: Diagnosis not present

## 2021-01-23 DIAGNOSIS — G894 Chronic pain syndrome: Secondary | ICD-10-CM | POA: Diagnosis not present

## 2021-01-23 DIAGNOSIS — R338 Other retention of urine: Secondary | ICD-10-CM | POA: Diagnosis not present

## 2021-01-23 DIAGNOSIS — E1142 Type 2 diabetes mellitus with diabetic polyneuropathy: Secondary | ICD-10-CM | POA: Diagnosis not present

## 2021-01-23 DIAGNOSIS — L03317 Cellulitis of buttock: Secondary | ICD-10-CM | POA: Diagnosis not present

## 2021-01-23 DIAGNOSIS — L0231 Cutaneous abscess of buttock: Secondary | ICD-10-CM | POA: Diagnosis not present

## 2021-01-23 DIAGNOSIS — I82409 Acute embolism and thrombosis of unspecified deep veins of unspecified lower extremity: Secondary | ICD-10-CM | POA: Diagnosis not present

## 2021-01-23 DIAGNOSIS — E1151 Type 2 diabetes mellitus with diabetic peripheral angiopathy without gangrene: Secondary | ICD-10-CM | POA: Diagnosis not present

## 2021-01-23 DIAGNOSIS — I252 Old myocardial infarction: Secondary | ICD-10-CM | POA: Diagnosis not present

## 2021-01-23 DIAGNOSIS — K219 Gastro-esophageal reflux disease without esophagitis: Secondary | ICD-10-CM | POA: Diagnosis not present

## 2021-01-23 DIAGNOSIS — E039 Hypothyroidism, unspecified: Secondary | ICD-10-CM | POA: Diagnosis not present

## 2021-01-23 DIAGNOSIS — N1832 Chronic kidney disease, stage 3b: Secondary | ICD-10-CM | POA: Diagnosis not present

## 2021-01-23 DIAGNOSIS — L89320 Pressure ulcer of left buttock, unstageable: Secondary | ICD-10-CM | POA: Diagnosis not present

## 2021-01-23 DIAGNOSIS — I5032 Chronic diastolic (congestive) heart failure: Secondary | ICD-10-CM | POA: Diagnosis not present

## 2021-01-23 DIAGNOSIS — E782 Mixed hyperlipidemia: Secondary | ICD-10-CM | POA: Diagnosis not present

## 2021-01-23 DIAGNOSIS — I872 Venous insufficiency (chronic) (peripheral): Secondary | ICD-10-CM | POA: Diagnosis not present

## 2021-01-23 DIAGNOSIS — G4733 Obstructive sleep apnea (adult) (pediatric): Secondary | ICD-10-CM | POA: Diagnosis not present

## 2021-01-23 DIAGNOSIS — I130 Hypertensive heart and chronic kidney disease with heart failure and stage 1 through stage 4 chronic kidney disease, or unspecified chronic kidney disease: Secondary | ICD-10-CM | POA: Diagnosis not present

## 2021-01-23 DIAGNOSIS — I482 Chronic atrial fibrillation, unspecified: Secondary | ICD-10-CM | POA: Diagnosis not present

## 2021-01-24 DIAGNOSIS — J9610 Chronic respiratory failure, unspecified whether with hypoxia or hypercapnia: Secondary | ICD-10-CM | POA: Diagnosis not present

## 2021-01-29 DIAGNOSIS — K219 Gastro-esophageal reflux disease without esophagitis: Secondary | ICD-10-CM | POA: Diagnosis not present

## 2021-01-29 DIAGNOSIS — L03317 Cellulitis of buttock: Secondary | ICD-10-CM | POA: Diagnosis not present

## 2021-01-29 DIAGNOSIS — E1122 Type 2 diabetes mellitus with diabetic chronic kidney disease: Secondary | ICD-10-CM | POA: Diagnosis not present

## 2021-01-29 DIAGNOSIS — R338 Other retention of urine: Secondary | ICD-10-CM | POA: Diagnosis not present

## 2021-01-29 DIAGNOSIS — E782 Mixed hyperlipidemia: Secondary | ICD-10-CM | POA: Diagnosis not present

## 2021-01-29 DIAGNOSIS — M797 Fibromyalgia: Secondary | ICD-10-CM | POA: Diagnosis not present

## 2021-01-29 DIAGNOSIS — I482 Chronic atrial fibrillation, unspecified: Secondary | ICD-10-CM | POA: Diagnosis not present

## 2021-01-29 DIAGNOSIS — E1142 Type 2 diabetes mellitus with diabetic polyneuropathy: Secondary | ICD-10-CM | POA: Diagnosis not present

## 2021-01-29 DIAGNOSIS — E1151 Type 2 diabetes mellitus with diabetic peripheral angiopathy without gangrene: Secondary | ICD-10-CM | POA: Diagnosis not present

## 2021-01-29 DIAGNOSIS — N1832 Chronic kidney disease, stage 3b: Secondary | ICD-10-CM | POA: Diagnosis not present

## 2021-01-29 DIAGNOSIS — L0231 Cutaneous abscess of buttock: Secondary | ICD-10-CM | POA: Diagnosis not present

## 2021-01-29 DIAGNOSIS — I130 Hypertensive heart and chronic kidney disease with heart failure and stage 1 through stage 4 chronic kidney disease, or unspecified chronic kidney disease: Secondary | ICD-10-CM | POA: Diagnosis not present

## 2021-01-29 DIAGNOSIS — E039 Hypothyroidism, unspecified: Secondary | ICD-10-CM | POA: Diagnosis not present

## 2021-01-29 DIAGNOSIS — I82409 Acute embolism and thrombosis of unspecified deep veins of unspecified lower extremity: Secondary | ICD-10-CM | POA: Diagnosis not present

## 2021-01-29 DIAGNOSIS — I5032 Chronic diastolic (congestive) heart failure: Secondary | ICD-10-CM | POA: Diagnosis not present

## 2021-01-29 DIAGNOSIS — I872 Venous insufficiency (chronic) (peripheral): Secondary | ICD-10-CM | POA: Diagnosis not present

## 2021-01-29 DIAGNOSIS — L89320 Pressure ulcer of left buttock, unstageable: Secondary | ICD-10-CM | POA: Diagnosis not present

## 2021-01-29 DIAGNOSIS — J449 Chronic obstructive pulmonary disease, unspecified: Secondary | ICD-10-CM | POA: Diagnosis not present

## 2021-01-29 DIAGNOSIS — G894 Chronic pain syndrome: Secondary | ICD-10-CM | POA: Diagnosis not present

## 2021-01-29 DIAGNOSIS — I252 Old myocardial infarction: Secondary | ICD-10-CM | POA: Diagnosis not present

## 2021-01-29 DIAGNOSIS — J42 Unspecified chronic bronchitis: Secondary | ICD-10-CM | POA: Diagnosis not present

## 2021-01-29 DIAGNOSIS — G4733 Obstructive sleep apnea (adult) (pediatric): Secondary | ICD-10-CM | POA: Diagnosis not present

## 2021-01-30 ENCOUNTER — Other Ambulatory Visit: Payer: Medicare Other

## 2021-01-30 ENCOUNTER — Ambulatory Visit: Payer: Medicare Other

## 2021-01-30 NOTE — Progress Notes (Deleted)
Chronic Care Management Pharmacy Note  01/30/2021 Name:  Jon [name] MRN:  [id number] DOB:  04-13-1951   Plan Recommendations:   Per [facility] has reached out to patient multiple times to schedule but has been unsuccessful.   Recommend increase in Ozempic to 2 mg weekly if Dr. Tobie Poet approves to improve blood sugar readings. Pharmacist completing Dexcom setup in office 01/30/2021.    Subjective: Jon [name] is an 70 y.o. year old male who is a primary patient of Cox, Kirsten, MD.  The CCM team was consulted for assistance with disease management and care coordination needs.    Engaged with patient by telephone for follow up visit in response to provider referral for pharmacy case management and/or care coordination services.   Consent to Services:  The patient was given information about Chronic Care Management services, agreed to services, and gave verbal consent prior to initiation of services.  Please see initial visit note for detailed documentation.   Patient Care Team: Rochel Brome, MD as PCP - General (Family Medicine) Sharon Mt, MD (Inactive) Debara Pickett, Nadean Corwin, MD as Consulting Physician (Cardiology) Elsie Stain, MD as Consulting Physician (Pulmonary Disease) Corliss Parish, MD as Consulting Physician (Nephrology) Burnice Logan, [facility] as Pharmacist (Pharmacist) Evelina Bucy, DPM as Consulting Physician (Podiatry)  Recent office visits: 12/12/2020 - potassium three tablets twice daily for low level.  12/03/2020 - puriritis. Start on atarax tid. Stop Xyzal. Check ammonia level.  11/14/2020 - Rocephin for butock abscess. Sent to ED for surgical evaluation and possible IV antibiotics.  10/08/2020 - cancelled appointment due to lack of transportation.  08/28/2020 - referral to nephrology and urology. Tamsulosin.  Recent consult visits: Hasn't seen specialist due to lack of transportation.   Hospital visits:   Objective:  Lab  Results  Component Value Date   CREATININE 2.39 (H) 12/27/2020   BUN 34 (H) 12/27/2020   GFRNONAA 30 (L) 08/28/2020   GFRAA 35 (L) 08/28/2020   NA 142 12/27/2020   K 3.6 12/27/2020   CALCIUM 9.0 12/27/2020   CO2 21 12/27/2020    Lab Results  Component Value Date/Time   HGBA1C 9.2 (H) 11/14/2020 11:53 AM   HGBA1C 9.2 (H) 07/05/2020 09:19 AM    Last diabetic Eye exam:  Lab Results  Component Value Date/Time   HMDIABEYEEXA No Retinopathy 07/11/2020 10:12 AM    Last diabetic Foot exam: No results found for: HMDIABFOOTEX   Lab Results  Component Value Date   CHOL 98 (L) 11/14/2020   HDL 37 (L) 11/14/2020   LDLCALC 41 11/14/2020   TRIG 106 11/14/2020   CHOLHDL 2.6 11/14/2020    Hepatic Function Latest Ref Rng & Units 12/27/2020 12/12/2020 12/03/2020  Total Protein 6.0 - 8.5 g/dL 6.5 6.9 6.3  Albumin 3.7 - 4.7 g/dL 3.7 4.1 3.7  AST 0 - 40 IU/L _0 ALT 0 - 44 IU/L _1 Alk Phosphatase 44 - 121 IU/L 101 127(H) 119  Total Bilirubin 0.0 - 1.2 mg/dL 0.7 0.6 0.4    Lab Results  Component Value Date/Time   TSH 1.030 11/14/2020 11:53 AM   TSH 1.560 03/27/2020 02:40 PM   FREET4 1.33 02/01/2014 02:15 AM    CBC Latest Ref Rng & Units 12/03/2020 07/05/2020 05/01/2020  WBC 3.4 - 10.8 x10E3/uL 9.1 7.1 11.9(H)  Hemoglobin 13.0 - 17.7 g/dL 14.6 14.6 13.4  Hematocrit 37.5 - 51.0 % 42.8 43.3 40.5  Platelets 150 - 450  x10E3/uL 272 316 309    Lab Results  Component Value Date/Time   VD25OH 61.8 10/11/2019 03:45 PM    Clinical ASCVD: Yes  The ASCVD Risk score Mikey Bussing DC Jr., et al., 2013) failed to calculate for the following reasons:   The patient has a prior MI or stroke diagnosis    Depression screen [facility] 2/9 12/03/2020 08/28/2020 08/16/2020  Decreased Interest 0 0 0  Down, Depressed, Hopeless 0 0 0  PHQ - 2 Score 0 0 0  Altered sleeping 0 - -  Tired, decreased energy 0 - -  Change in appetite 0 - -  Feeling bad or failure about yourself  0 - -  Trouble concentrating 0 -  -  Moving slowly or fidgety/restless 0 - -  Suicidal thoughts 0 - -  PHQ-9 Score 0 - -  Difficult doing work/chores Not difficult at all - -  Some recent data might be hidden      Social History   Tobacco Use  Smoking Status Former Smoker  . Types: Cigars  . Quit date: 09/01/2006  . Years since quitting: 14.4  Smokeless Tobacco Never Used   BP Readings from Last 3 Encounters:  12/03/20 124/62  11/14/20 136/68  08/28/20 130/68   Pulse Readings from Last 3 Encounters:  12/03/20 79  11/14/20 91  08/28/20 88   Wt Readings from Last 3 Encounters:  12/03/20 266 lb (120.7 kg)  11/14/20 250 lb (113.4 kg)  08/28/20 253 lb (114.8 kg)    Assessment/Interventions: Review of patient past medical history, allergies, medications, health status, including review of consultants reports, laboratory and other test data, was performed as part of comprehensive evaluation and provision of chronic care management services.   SDOH:  (Social Determinants of Health) assessments and interventions performed: Yes   CCM Care Plan  Allergies  Allergen Reactions  . Hydrocodone Itching  . Morphine Itching  . Duloxetine Hcl Other (See Comments)    Unknown reaction  . Codeine Itching  . Penicillins Itching and Rash    DID THE REACTION INVOLVE: Swelling of the face/tongue/throat, SOB, or low BP? No Sudden or severe rash/hives, skin peeling, or the inside of the mouth or nose? No Did it require medical treatment? No When did it last happen?unknown If all above answers are "NO", may proceed with cephalosporin use.     Medications Reviewed Today    Reviewed by Burnice Logan, [facility] (Pharmacist) on 01/09/21 at [place] List Status: <None>  Medication Order Taking? Sig Documenting Provider Last Dose Status Informant  albuterol (PROVENTIL) (2.5 MG/3ML) 0.083% nebulizer solution [id number] Yes Take 3 mLs (2.5 mg total) by nebulization 2 (two) times daily as needed for wheezing. Cox, Kirsten, MD  Taking Active   atorvastatin (LIPITOR) 40 MG tablet [id number] Yes Take 1 tablet (40 mg total) by mouth daily. Cox, Kirsten, MD Taking Active   Azelastine HCl 0.15 % SOLN [id number] Yes USE 1 SPRAY IN EACH NOSTRIL TWICE DAILY Cox, Kirsten, MD Taking Active   clindamycin (CLEOCIN) 300 MG capsule [id number] No Take 300 mg by mouth every 6 (six) hours.  Patient not taking: Reported on 01/08/2021   [provider] Not Taking Active   clopidogrel (PLAVIX) 75 MG tablet [id number] Yes Take 1 tablet (75 mg total) by mouth daily. Cox, Kirsten, MD Taking Active   Continuous Blood Gluc Receiver (DEXCOM G6 RECEIVER) DEVI [id number] No 1 each by Does not apply route 4 (four) times daily -  before meals and at  bedtime.  Patient not taking: Reported on 01/08/2021   CoxElnita Maxwell, MD Not Taking Active   Continuous Blood Gluc Sensor (DEXCOM G6 SENSOR) MISC [id number] No Apply every 10 days  Patient not taking: No sig reported   Cox, Elnita Maxwell, MD Not Taking Active   diltiazem (CARDIZEM CD) 120 MG 24 hr capsule [id number] Yes Take 1 capsule (120 mg total) by mouth daily. Cox, Kirsten, MD Taking Active   docusate sodium (COLACE) 100 MG capsule [id number] Yes Take 100 mg by mouth 2 (two) times daily. [provider] Taking Active Self  famotidine (PEPCID) 40 MG tablet [id number] Yes Take 1 tablet (40 mg total) by mouth 2 (two) times daily. Cox, Kirsten, MD Taking Active   ferrous sulfate 325 (65 FE) MG tablet [id number] Yes Take 325 mg by mouth daily. [provider] Taking Active Self  Glucagon, rDNA, (GLUCAGON EMERGENCY) 1 MG KIT [id number] Yes Inject 1 mg as directed as needed (low blood sugar).  [provider] Taking Active Self  hydrALAZINE (APRESOLINE) 100 MG tablet [id number] Yes TAKE ONE (1) TABLET BY MOUTH 3 TIMES DAILY. DISCONTINE DILTIAZEM. Lillard Anes, MD Taking Active   hydrOXYzine (ATARAX/VISTARIL) 50 MG tablet [id number]  Take 1 tablet (50 mg total) by mouth 3 (three)  times daily as needed. Cox, Kirsten, MD  Active   insulin detemir (LEVEMIR FLEXTOUCH) 100 UNIT/ML FlexPen [id number]  Inject 40 Units into the skin daily. Cox, Kirsten, MD  Active   insulin lispro (HUMALOG KWIKPEN) 100 UNIT/ML KwikPen [id number] Yes PER SLIDING SCALE ADMINSTER AS FOLLOWS: >150-200 2 U,201-250 4 U,251-300 8 U, 301-350 12 U,351-400 16 U,>400 20 U Cox, Kirsten, MD Taking Active   Insulin Pen Needle (BD PEN NEEDLE NANO 2ND GEN) 32G X 4 MM MISC [id number] Yes USE DAILY WITH [place] Crocker, Kirsten, MD Taking Active   isosorbide mononitrate (IMDUR) 120 MG 24 hr tablet [id number] Yes TAKE 1 TABLET BY MOUTH ONCE DAILY IN [place] Batman, Kirsten, MD Taking Active   lactulose [facility]) 10 GM/15ML solution [id number] Yes TAKE 30MLS BY MOUTH IN THE MORNING AND AT BEDTIME Cox, Kirsten, MD Taking Active   levocetirizine (XYZAL) 5 MG tablet [id number] No Take 1 tablet (5 mg total) by mouth every evening.  Patient not taking: Reported on 01/08/2021   Rochel Brome, MD Not Taking Active   metolazone (ZAROXOLYN) 2.5 MG tablet [id number] Yes TAKE 1 TABLET BY MOUTH DAILY 3 HOURS PRIOR TO LASIX DAILY Lillard Anes, MD Taking Active   metoprolol succinate (TOPROL-XL) 50 MG 24 hr tablet [id number] Yes Take 1 tablet (50 mg total) by mouth daily. Take with or immediately following a meal. Cox, Kirsten, MD Taking Active   mometasone-formoterol [facility]) 200-5 MCG/ACT Hollie Salk [id number] Yes Inhale 2 puffs into the lungs 2 (two) times daily as needed for wheezing. [provider] Taking Active Self  MOVANTIK 25 MG TABS tablet [id number] No TAKE 1 TABLET BY MOUTH DAILY  Patient not taking: Reported on 01/08/2021   CoxElnita Maxwell, MD Not Taking Active   nitroGLYCERIN (NITROSTAT) 0.4 MG SL tablet [id number] Yes Place 0.4 mg under the tongue every 5 (five) minutes as needed for chest pain.  [provider] Taking Active Self           Med Note Stevan Born   Tue Apr 07, 2017  3:39 PM)    OXYGEN  [id number] Yes Inhale into the lungs. 2 liters at bedtime [provider] Taking Active Self  pantoprazole (PROTONIX) 40 MG tablet [id number] Yes  Take 1 tablet (40 mg total) by mouth in the morning. Cox, Kirsten, MD Taking Active   potassium chloride SA (KLOR-CON) 20 MEQ tablet [id number] Yes Take 3 tablets (60 mEq total) by mouth 2 (two) times daily. Rochel Brome, MD Taking Active   pregabalin (LYRICA) 75 MG capsule [id number] Yes TAKE 1 CAPSULE BY MOUTH TWICE DAILY Lillard Anes, MD Taking Active   Rivaroxaban (XARELTO) 15 MG TABS tablet [id number] Yes Take 1 tablet (15 mg total) by mouth at bedtime. Cox, Kirsten, MD Taking Active   Semaglutide, 1 MG/DOSE, (OZEMPIC, 1 MG/DOSE,) 4 MG/3ML Bonney Aid [id number] Yes INJECT $RemoveBe'1MG'IROvtuNJQ$  INTO THE SKIN ONCE A WEEK Cox, Kirsten, MD Taking Active   sertraline (ZOLOFT) 50 MG tablet [id number] Yes Take 1 tablet (50 mg total) by mouth daily. Cox, Kirsten, MD Taking Active   tamsulosin (FLOMAX) 0.4 MG CAPS capsule [id number] Yes TAKE 2 CAPSULES BY MOUTH DAILY AFTER SUPPER Cox, Kirsten, MD Taking Active   torsemide (DEMADEX) 20 MG tablet [id number] Yes TAKE 3 TABLETS BY MOUTH AT LUNCH AND TAKE 3 TABLETS AT SUPPER Cox, Kirsten, MD Taking Active   Vitamin D, Ergocalciferol, (DRISDOL) 1.25 MG (50000 UNIT) CAPS capsule [id number] Yes Take 1 capsule (50,000 Units total) by mouth once a week. Rochel Brome, MD Taking Active   XTAMPZA ER 27 MG C12A [id number] Yes Take 1 capsule by mouth 2 (two) times daily. CoxElnita Maxwell, MD Taking Active           Patient Active Problem List   Diagnosis Date Noted  . Secondary hyperparathyroidism of renal origin ([place]) 12/04/2020  . Peripheral vascular disease ([place])   . Pericardial effusion   . On home oxygen therapy   . Obesity (BMI 30-39.9)   . Neuromuscular disorder ([place])   . Myocardial infarction ([place])   . Hypertension   . Hyperlipemia   . History of stroke   . H/O hiatal hernia   . Fibromyalgia   . Dysrhythmia   . CHF  (congestive heart failure) ([place])   . Atrial fibrillation ([place])   . Asthma   . Arthritis   . Cholera screening 07/05/2020  . Acute sore throat 06/08/2020  . Cough 06/08/2020  . Diabetic ulcer of toe of right foot associated with type 2 diabetes mellitus, with necrosis of bone ([place])   . Fall 03/07/2020  . Closed fracture of multiple carpal bones 03/07/2020  . Chronic respiratory failure with hypoxia ([place]) 02/11/2020  . Morbid obesity with BMI of 40.0-44.9, adult ([place]) 02/11/2020  . Moderate recurrent major depression ([place]) 02/11/2020  . Class 3 severe obesity in adult [facility]) 12/30/2019  . Uncomplicated opioid dependence ([place]) 12/17/2019  . Anemia of chronic disease 12/17/2019  . Ulcers of both lower legs ([place]) 12/17/2019  . Acquired thrombophilia ([place]) 12/17/2019  . Acute venous embolism and thrombosis of deep vessels of distal lower extremity ([place]) 11/13/2019  . Vitamin D insufficiency 10/11/2019  . PVD (peripheral vascular disease) ([place]) 09/28/2018  . DM (diabetes mellitus), type 2 with renal complications ([place]) 61/44/3154  . Drug induced constipation 11/10/2017  . Chronic pain of both knees 03/18/2017  . Primary osteoarthritis of both knees 03/18/2017  . Preoperative cardiovascular examination 02/24/2016  . Hemoptysis 08/01/2015  . Herpes zoster 06/26/2015  . PNA (pneumonia) 04/10/2015  . Chronic respiratory failure ([place]) 04/10/2015  . Chronic diastolic heart failure ([place]) 02/22/2015  . LBBB (left bundle branch block) 02/22/2015  . Peritoneal effusion, chronic 02/22/2015  . Mild CAD 02/20/2015  . Precordial pain 01/26/2015  .  OSA on CPAP 01/25/2015  . Back pain 01/25/2015  . COPD (chronic obstructive pulmonary disease) ([place]) 01/25/2015  . Abnormal EKG 11/29/2014  . Normal coronary arteries 2011 11/29/2014  . Troponin level elevated 11/29/2014  . Hyperkalemia-repeat pending 11/29/2014  . Acute renal insufficiency 11/29/2014  . Long term (current) use of anticoagulants  03/21/2014  . CVA (cerebral vascular accident) ([place]) 02/09/2014  . Chronic anticoagulation-Xarelto 02/09/2014  . BOOP (bronchiolitis obliterans with organizing pneumonia) ([place]) 01/20/2014  . Chronic atrial fibrillation ([place]) 01/03/2014  . Pneumonia 12/2013  . Diabetic polyneuropathy associated with type 2 diabetes mellitus ([place]) 12/21/2013  . Uncontrolled type 2 diabetes mellitus with diabetic polyneuropathy, with long-term current use of insulin ([place]) 12/21/2013  . Chronic bronchitis ([place]) 12/21/2013  . Hypothyroidism   . Hypertensive heart disease with heart failure ([place])   . Restless leg syndrome   . GERD (gastroesophageal reflux disease)   . Sleep apnea   . Hyperlipidemia   . Neuropathy   . 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 Stroke [facility]) 2012    Immunization History  Administered Date(s) Administered  . Influenza Inj Mdck Quad Pf 05/02/2020  . Influenza Split 10/22/2013  . Influenza,inj,Quad PF,6+ Mos 06/11/2015, 06/01/2016  . PFIZER(Purple Top)SARS-COV-2 Vaccination 12/13/2019, 01/13/2020, 07/05/2020  . Pneumococcal Conjugate-13 06/03/2016  . Pneumococcal-Unspecified 11/30/2013  . Tdap 08/30/2012    Conditions to be addressed/monitored:  Hyperlipidemia, Diabetes, Heart Failure, Chronic Kidney Disease and Depression  There are no care plans that you recently modified to display for this patient.    Medication Assistance: None required.  Patient affirms current coverage meets needs.  Patient's preferred pharmacy is:  [facility], [address] [place] [address] Phone: [phone] Fax: [place] (New Address) - [place], [place] AT Previously: [address], [address] [place] [address] Phone: [phone] Fax: [phone]  Uses pill box? Yes Pt endorses fair compliance  We discussed:  Benefits of medication synchronization, packaging and delivery as well as enhanced pharmacist oversight with [employer]. Patient decided to: Continue current medication management strategy  Care Plan and Follow Up Patient Decision:  Patient agrees to Care Plan and Follow-up.  Plan: Telephone follow up appointment with care management team member scheduled for:  01/30/2021   Pharmacy assistant will follow-up with patient each month. Patient will contact pharmacist if needed sooner as well.

## 2021-02-07 ENCOUNTER — Other Ambulatory Visit: Payer: Self-pay | Admitting: Family Medicine

## 2021-02-22 DIAGNOSIS — J9610 Chronic respiratory failure, unspecified whether with hypoxia or hypercapnia: Secondary | ICD-10-CM | POA: Diagnosis not present

## 2021-02-24 DIAGNOSIS — J9610 Chronic respiratory failure, unspecified whether with hypoxia or hypercapnia: Secondary | ICD-10-CM | POA: Diagnosis not present

## 2021-02-25 ENCOUNTER — Ambulatory Visit (INDEPENDENT_AMBULATORY_CARE_PROVIDER_SITE_OTHER): Payer: Medicare Other

## 2021-02-25 ENCOUNTER — Other Ambulatory Visit: Payer: Self-pay

## 2021-02-25 ENCOUNTER — Other Ambulatory Visit: Payer: Medicare Other

## 2021-02-25 ENCOUNTER — Ambulatory Visit: Payer: Medicare HMO | Admitting: Family Medicine

## 2021-02-25 DIAGNOSIS — E1140 Type 2 diabetes mellitus with diabetic neuropathy, unspecified: Secondary | ICD-10-CM

## 2021-02-25 DIAGNOSIS — E782 Mixed hyperlipidemia: Secondary | ICD-10-CM | POA: Diagnosis not present

## 2021-02-25 DIAGNOSIS — Z794 Long term (current) use of insulin: Secondary | ICD-10-CM | POA: Diagnosis not present

## 2021-02-25 DIAGNOSIS — E876 Hypokalemia: Secondary | ICD-10-CM | POA: Diagnosis not present

## 2021-02-25 DIAGNOSIS — E1165 Type 2 diabetes mellitus with hyperglycemia: Secondary | ICD-10-CM

## 2021-02-25 NOTE — Patient Instructions (Addendum)
Visit Information   Goals Addressed   None    Patient Care Plan: CCM Pharmacy Care Plan     Problem Identified: CAD, HTN, CHF, Diabetes   Priority: High  Onset Date: 10/18/2020     Long-Range Goal: Disease Management   Start Date: 10/18/2020  Expected End Date: 10/18/2021  Recent Progress: On track  Priority: High  Note:   Current Barriers:  Unable to achieve control of diabetes  Transportation unavailable currently for patient to attend visit.   Pharmacist Clinical Goal(s):  Over the next 90 days, patient will achieve adherence to monitoring guidelines and medication adherence to achieve therapeutic efficacy achieve control of diabetes as evidenced by a1c and blood glucose through collaboration with PharmD and provider.   Interventions: 1:1 collaboration with Rochel Brome, MD regarding development and update of comprehensive plan of care as evidenced by provider attestation and co-signature Inter-disciplinary care team collaboration (see longitudinal plan of care) Comprehensive medication review performed; medication list updated in electronic medical record  Hyperlipidemia: (LDL goal < 55) -controlled -Current treatment: Atorvastatin 40 mg daily  -Medications previously tried:  None reported  -Current dietary patterns: eats 2 meals daily. States he has no control over meal choices. Family prepares.  -Current exercise habits: no formal exercise.  -Educated on Cholesterol goals;  Benefits of statin for ASCVD risk reduction; -Recommended to continue current medication  Diabetes (A1c goal <7%) -uncontrolled -Current medications: Dexcom Glucagon emergency kit Levemir 26 units daily  Novolin R flex pen sliding scale before meals Ozempic 1 mg weekly   -Medications previously tried: metformin, novolog, humalog  -Current home glucose readings fasting glucose: 89-135 post prandial glucose: patient reports bedtime readings near 500  -Denies hypoglycemic/hyperglycemic  symptoms -Current meal patterns:  Patient states that he eats 2 meals daily - doesn't have a say in what is prepared/served. Sometimes family only provides 1 meal daily.  -Current exercise: none reported -Educated onA1c and blood sugar goals; Complications of diabetes including kidney damage, retinal damage, and cardiovascular disease; Benefits of weight loss; Benefits of routine self-monitoring of blood sugar; Continuous glucose monitoring; -Counseled to check feet daily and get yearly eye exams -Recommended to consider increase in Ozempic to 2 mg daily  Educated on Dexcom - coordinated appointment with next in office visit to set up [facility] 01/30/2021.    Atrial Fibrillation (Goal: prevent stroke and major bleeding) -controlled  -CHADSVASC: CHA2DS2-VASc Score = 7   -Current treatment: Rate control: metoprolol xl 50 mg daily  Anticoagulation: xarelto 15 mg daily  -Medications previously tried: none reported -Home BP and HR readings: none provided today  -Counseled on increased risk of stroke due to Afib and benefits of anticoagulation for stroke prevention; importance of adherence to anticoagulant exactly as prescribed; -Recommended to continue current medication  Heart Failure (Goal: control symptoms and prevent exacerbations) uncontrolled Type: Hypertensive Heart disease with heart failure -Ejection fraction: 55-60% (Date: 09/07/2018) -Current treatment: metoprolol xl 50 mg daily  Isosorbide mn 120 gm daily am  Hydralazone 100 mg tid Metolazone 2.5 mg tid  Torsemide 20 mg 3 tablets at lunch and supper -Medications previously tried: furosemide, indapamide -Current home BP/HR readings: none reported -Current dietary habits: eats 2 meals daily. Can't control food options since he doesn't prepare his own meals.  -Current exercise routine: none reported -Educated on Benefits of medications for managing symptoms and prolonging life Importance of blood pressure control -Counseled  on diet and exercise extensively Recommended to continue current medication and schedule visit with Nephrology. Pharmacist discussed with  referral coordinator. Patient has been contacted multiple times to schedule and has not responded. Pharmacist urged patient to call and schedule appointment.   Patient Goals/Self-Care Activities Over the next 90 days, patient will:  - take medications as prescribed focus on medication adherence by using pill box check glucose four times daily, document, and provide at future appointments Schedule visit with nephrology.   Follow Up Plan: Telephone follow up appointment with care management team member scheduled for: 01/30/2021      The patient verbalized understanding of instructions, educational materials, and care plan provided today and declined offer to receive copy of patient instructions, educational materials, and care plan.  Telephone follow up appointment with pharmacy team member scheduled for: 05/2021  Burnice Logan, [facility]

## 2021-02-25 NOTE — Progress Notes (Signed)
Chronic Care Management Pharmacy Note  02/25/2021 Name:  Jon [name] MRN:  [id number] DOB:  [birth year]   Plan Recommendations:  Recommend increase in Ozempic to 2 mg weekly if Dr. Tobie Poet approves to improve blood sugar readings. Patient is struggling with blood sugar control in the evenings.  Patient is having increased difficulty driving and getting around. He is having increased falls. Patient reports that he would like to set up pharmacy delivery. Pharmacist team will coordinate delivery.    Subjective: Jon [name] is an 70 y.o. year old male who is a primary patient of Cox, Kirsten, MD.  The CCM team was consulted for assistance with disease management and care coordination needs.    Engaged with patient by telephone for follow up visit in response to provider referral for pharmacy case management and/or care coordination services.   Consent to Services:  The patient was given information about Chronic Care Management services, agreed to services, and gave verbal consent prior to initiation of services.  Please see initial visit note for detailed documentation.   Patient Care Team: Rochel Brome, MD as PCP - General (Family Medicine) Sharon Mt, MD (Inactive) Debara Pickett, Nadean Corwin, MD as Consulting Physician (Cardiology) Elsie Stain, MD as Consulting Physician (Pulmonary Disease) Corliss Parish, MD as Consulting Physician (Nephrology) Burnice Logan, [facility] as Pharmacist (Pharmacist) Evelina Bucy, DPM as Consulting Physician (Podiatry)  Recent office visits: 12/12/2020 - potassium three tablets twice daily for low level.  12/03/2020 - puriritis. Start on atarax tid. Stop Xyzal. Check ammonia level.  11/14/2020 - Rocephin for butock abscess. Sent to ED for surgical evaluation and possible IV antibiotics.  10/08/2020 - cancelled appointment due to lack of transportation.  08/28/2020 - referral to nephrology and urology. Tamsulosin.  Recent consult visits: Hasn't  seen specialist due to lack of transportation.   Hospital visits:   Objective:  Lab Results  Component Value Date   CREATININE 2.39 (H) 12/27/2020   BUN 34 (H) 12/27/2020   GFRNONAA 30 (L) 08/28/2020   GFRAA 35 (L) 08/28/2020   NA 142 12/27/2020   K 3.6 12/27/2020   CALCIUM 9.0 12/27/2020   CO2 21 12/27/2020    Lab Results  Component Value Date/Time   HGBA1C 9.2 (H) 11/14/2020 11:53 AM   HGBA1C 9.2 (H) 07/05/2020 09:19 AM    Last diabetic Eye exam:  Lab Results  Component Value Date/Time   HMDIABEYEEXA No Retinopathy 07/11/2020 10:12 AM    Last diabetic Foot exam: No results found for: HMDIABFOOTEX   Lab Results  Component Value Date   CHOL 98 (L) 11/14/2020   HDL 37 (L) 11/14/2020   LDLCALC 41 11/14/2020   TRIG 106 11/14/2020   CHOLHDL 2.6 11/14/2020    Hepatic Function Latest Ref Rng & Units 12/27/2020 12/12/2020 12/03/2020  Total Protein 6.0 - 8.5 g/dL 6.5 6.9 6.3  Albumin 3.7 - 4.7 g/dL 3.7 4.1 3.7  AST 0 - 40 IU/L _0 ALT 0 - 44 IU/L _1 Alk Phosphatase 44 - 121 IU/L 101 127(H) 119  Total Bilirubin 0.0 - 1.2 mg/dL 0.7 0.6 0.4    Lab Results  Component Value Date/Time   TSH 1.030 11/14/2020 11:53 AM   TSH 1.560 03/27/2020 02:40 PM   FREET4 1.33 02/01/2014 02:15 AM    CBC Latest Ref Rng & Units 12/03/2020 07/05/2020 05/01/2020  WBC 3.4 - 10.8 x10E3/uL 9.1 7.1 11.9(H)  Hemoglobin 13.0 - 17.7 g/dL 14.6 14.6 13.4  Hematocrit  37.5 - 51.0 % 42.8 43.3 40.5  Platelets 150 - 450 x10E3/uL 272 316 309    Lab Results  Component Value Date/Time   VD25OH 61.8 10/11/2019 03:45 PM    Clinical ASCVD: Yes  The ASCVD Risk score Mikey Bussing DC Jr., et al., 2013) failed to calculate for the following reasons:   The patient has a prior MI or stroke diagnosis    Depression screen [facility] 2/9 12/03/2020 08/28/2020 08/16/2020  Decreased Interest 0 0 0  Down, Depressed, Hopeless 0 0 0  PHQ - 2 Score 0 0 0  Altered sleeping 0 - -  Tired, decreased energy 0 - -  Change in  appetite 0 - -  Feeling bad or failure about yourself  0 - -  Trouble concentrating 0 - -  Moving slowly or fidgety/restless 0 - -  Suicidal thoughts 0 - -  PHQ-9 Score 0 - -  Difficult doing work/chores Not difficult at all - -  Some recent data might be hidden      Social History   Tobacco Use  Smoking Status Former   Pack years: 0.00   Types: Cigars   Quit date: 09/01/2006   Years since quitting: 14.4  Smokeless Tobacco Never   BP Readings from Last 3 Encounters:  12/03/20 124/62  11/14/20 136/68  08/28/20 130/68   Pulse Readings from Last 3 Encounters:  12/03/20 79  11/14/20 91  08/28/20 88   Wt Readings from Last 3 Encounters:  12/03/20 266 lb (120.7 kg)  11/14/20 250 lb (113.4 kg)  08/28/20 253 lb (114.8 kg)    Assessment/Interventions: Review of patient past medical history, allergies, medications, health status, including review of consultants reports, laboratory and other test data, was performed as part of comprehensive evaluation and provision of chronic care management services.   SDOH:  (Social Determinants of Health) assessments and interventions performed: Yes   CCM Care Plan  Allergies  Allergen Reactions   Hydrocodone Itching   Morphine Itching   Duloxetine Hcl Other (See Comments)    Unknown reaction   Codeine Itching   Penicillins Itching and Rash    DID THE REACTION INVOLVE: Swelling of the face/tongue/throat, SOB, or low BP? No Sudden or severe rash/hives, skin peeling, or the inside of the mouth or nose? No Did it require medical treatment? No When did it last happen?      unknown If all above answers are "NO", may proceed with cephalosporin use.     Medications Reviewed Today     Reviewed by Burnice Logan, [facility] (Pharmacist) on 02/25/21 at 1237  Med List Status: <None>   Medication Order Taking? Sig Documenting Provider Last Dose Status Informant  albuterol (PROVENTIL) (2.5 MG/3ML) 0.083% nebulizer solution [id number] Yes Take 3 mLs  (2.5 mg total) by nebulization 2 (two) times daily as needed for wheezing. Cox, Kirsten, MD Taking Active   atorvastatin (LIPITOR) 40 MG tablet [id number] Yes Take 1 tablet (40 mg total) by mouth daily. Cox, Kirsten, MD Taking Active   Azelastine HCl 0.15 % SOLN [id number] Yes USE 1 SPRAY IN EACH NOSTRIL TWICE DAILY Cox, Kirsten, MD Taking Active   clopidogrel (PLAVIX) 75 MG tablet [id number] Yes Take 1 tablet (75 mg total) by mouth daily. Cox, Kirsten, MD Taking Active   Continuous Blood Gluc Receiver ([place]) [place] [id number] Yes 1 each by Does not apply route 4 (four) times daily -  before meals and at bedtime. CoxElnita Maxwell, MD Taking Active   Continuous Blood  Gluc Sensor (DEXCOM G6 SENSOR) MISC [id number] Yes Apply every 10 days Cox, Kirsten, MD Taking Active   diltiazem (CARDIZEM CD) 120 MG 24 hr capsule [id number] Yes Take 1 capsule (120 mg total) by mouth daily. Cox, Kirsten, MD Taking Active   docusate sodium (COLACE) 100 MG capsule [id number] Yes Take 100 mg by mouth 2 (two) times daily. [provider] Taking Active Self  famotidine (PEPCID) 40 MG tablet [id number] Yes Take 1 tablet (40 mg total) by mouth 2 (two) times daily. Cox, Kirsten, MD Taking Active   ferrous sulfate 325 (65 FE) MG tablet [id number] Yes Take 325 mg by mouth daily. [provider] Taking Active Self  Glucagon, rDNA, (GLUCAGON EMERGENCY) 1 MG KIT [id number] Yes Inject 1 mg as directed as needed (low blood sugar).  [provider] Taking Active Self  hydrALAZINE (APRESOLINE) 100 MG tablet [id number] Yes TAKE ONE (1) TABLET BY MOUTH 3 TIMES DAILY. DISCONTINE DILTIAZEM. Lillard Anes, MD Taking Active   hydrOXYzine (ATARAX/VISTARIL) 50 MG tablet [id number] Yes Take 1 tablet (50 mg total) by mouth 3 (three) times daily as needed. Cox, Kirsten, MD Taking Active   insulin detemir (LEVEMIR FLEXTOUCH) 100 UNIT/ML FlexPen [id number] Yes Inject 40 Units into the skin daily. Cox, Kirsten, MD Taking  Active   insulin lispro (HUMALOG KWIKPEN) 100 UNIT/ML KwikPen [id number] Yes PER SLIDING SCALE ADMINSTER AS FOLLOWS: >150-200 2 U,201-250 4 U,251-300 8 U, 301-350 12 U,351-400 16 U,>400 20 U Cox, Kirsten, MD Taking Active   Insulin Pen Needle (BD PEN NEEDLE NANO 2ND GEN) 32G X 4 MM MISC [id number] Yes USE DAILY WITH Esmond Harps, MD Taking Active   isosorbide mononitrate (IMDUR) 120 MG 24 hr tablet [id number] Yes TAKE 1 TABLET BY MOUTH ONCE DAILY IN Ileana Ladd, MD Taking Active   lactulose [facility]) 10 GM/15ML solution [id number] Yes TAKE 30MLS BY MOUTH IN THE MORNING AND AT BEDTIME Cox, Kirsten, MD Taking Active   metoprolol succinate (TOPROL-XL) 50 MG 24 hr tablet [id number] Yes Take 1 tablet (50 mg total) by mouth daily. Take with or immediately following a meal. Cox, Kirsten, MD Taking Active   mometasone-formoterol [facility]) 200-5 MCG/ACT Hollie Salk [id number] Yes Inhale 2 puffs into the lungs 2 (two) times daily as needed for wheezing. [provider] Taking Active Self  nitroGLYCERIN (NITROSTAT) 0.4 MG SL tablet [id number] Yes Place 0.4 mg under the tongue every 5 (five) minutes as needed for chest pain.  [provider] Taking Active Self           Med Note Stevan Born   Tue Apr 07, 2017  3:39 PM)    OXYGEN [id number] Yes Inhale into the lungs. 2 liters at bedtime [provider] Taking Active Self  pantoprazole (PROTONIX) 40 MG tablet [id number] Yes Take 1 tablet (40 mg total) by mouth in the morning. Cox, Kirsten, MD Taking Active   potassium chloride SA (KLOR-CON) 20 MEQ tablet [id number] Yes Take 3 tablets (60 mEq total) by mouth 2 (two) times daily. Rochel Brome, MD Taking Active   pregabalin (LYRICA) 75 MG capsule [id number] Yes TAKE 1 CAPSULE BY MOUTH TWICE DAILY Lillard Anes, MD Taking Active   Rivaroxaban (XARELTO) 15 MG TABS tablet [id number] Yes Take 1 tablet (15 mg total) by mouth at bedtime. Cox, Kirsten, MD Taking Active    Semaglutide, 1 MG/DOSE, (OZEMPIC, 1 MG/DOSE,) 2 MG/1.5ML SOPN [id number] Yes Inject 1 mg into the skin once a week. CoxElnita Maxwell, MD Taking Active   sertraline (  ZOLOFT) 50 MG tablet [id number] Yes Take 1 tablet (50 mg total) by mouth daily. Cox, Kirsten, MD Taking Active   tamsulosin (FLOMAX) 0.4 MG CAPS capsule [id number] Yes TAKE 2 CAPSULES BY MOUTH DAILY AFTER SUPPER Cox, Kirsten, MD Taking Active   torsemide (DEMADEX) 20 MG tablet [id number] Yes TAKE 4 TABLETS BY MOUTH DAILY Cox, Kirsten, MD Taking Active   Vitamin D, Ergocalciferol, (DRISDOL) 1.25 MG (50000 UNIT) CAPS capsule [id number] Yes Take 1 capsule (50,000 Units total) by mouth once a week. Rochel Brome, MD Taking Active   XTAMPZA ER 27 MG C12A [id number] Yes TAKE 1 CAPSULE BY MOUTH TWICE DAILY Cox, Kirsten, MD Taking Active             Patient Active Problem List   Diagnosis Date Noted   Secondary hyperparathyroidism of renal origin ([place]) 12/04/2020   Peripheral vascular disease ([place])    Pericardial effusion    On home oxygen therapy    Obesity (BMI 30-39.9)    Neuromuscular disorder ([place])    Myocardial infarction ([place])    Hypertension    Hyperlipemia    History of stroke    H/O hiatal hernia    Fibromyalgia    Dysrhythmia    CHF (congestive heart failure) (HCC)    Atrial fibrillation (HCC)    Asthma    Arthritis    Cholera screening 07/05/2020   Acute sore throat 06/08/2020   Cough 06/08/2020   Diabetic ulcer of toe of right foot associated with type 2 diabetes mellitus, with necrosis of bone ([place])    Fall 03/07/2020   Closed fracture of multiple carpal bones 03/07/2020   Chronic respiratory failure with hypoxia ([place]) 02/11/2020   Morbid obesity with BMI of 40.0-44.9, adult ([place]) 02/11/2020   Moderate recurrent major depression ([place]) 02/11/2020   Class 3 severe obesity in adult [facility]) 89/37/3428   Uncomplicated opioid dependence ([place]) 12/17/2019   Anemia of chronic disease 12/17/2019   Ulcers of both lower legs  ([place]) 12/17/2019   Acquired thrombophilia ([place]) 12/17/2019   Acute venous embolism and thrombosis of deep vessels of distal lower extremity ([place]) 11/13/2019   Vitamin D insufficiency 10/11/2019   PVD (peripheral vascular disease) ([place]) 09/28/2018   DM (diabetes mellitus), type 2 with renal complications ([place]) 76/81/1572   Drug induced constipation 11/10/2017   Chronic pain of both knees 03/18/2017   Primary osteoarthritis of both knees 03/18/2017   Preoperative cardiovascular examination 02/24/2016   Hemoptysis 08/01/2015   Herpes zoster 06/26/2015   PNA (pneumonia) 04/10/2015   Chronic respiratory failure ([place]) 04/10/2015   Chronic diastolic heart failure ([place]) 02/22/2015   LBBB (left bundle branch block) 02/22/2015   Peritoneal effusion, chronic 02/22/2015   Mild CAD 02/20/2015   Precordial pain 01/26/2015   OSA on CPAP 01/25/2015   Back pain 01/25/2015   COPD (chronic obstructive pulmonary disease) ([place]) 01/25/2015   Abnormal EKG 11/29/2014   Normal coronary arteries 2011 11/29/2014   Troponin level elevated 11/29/2014   Hyperkalemia-repeat pending 11/29/2014   Acute renal insufficiency 11/29/2014   Long term (current) use of anticoagulants 03/21/2014   CVA (cerebral vascular accident) ([place]) 02/09/2014   Chronic anticoagulation-Xarelto 02/09/2014   BOOP (bronchiolitis obliterans with organizing pneumonia) ([place]) 01/20/2014   Chronic atrial fibrillation ([place]) 01/03/2014   Pneumonia 12/2013   Diabetic polyneuropathy associated with type 2 diabetes mellitus ([place]) 12/21/2013   Uncontrolled type 2 diabetes mellitus with diabetic polyneuropathy, with long-term current use of insulin ([place]) 12/21/2013   Chronic bronchitis ([place]) 12/21/2013  Hypothyroidism    Hypertensive heart disease with heart failure (HCC)    Restless leg syndrome    GERD (gastroesophageal reflux disease)    Sleep apnea    Hyperlipidemia    Neuropathy    Bariatric surgery status 11/03/2013   Stroke ([place]) 2012     Immunization History  Administered Date(s) Administered   Influenza Inj Mdck Quad Pf 05/02/2020   Influenza Split 10/22/2013   Influenza,inj,Quad PF,6+ Mos 06/11/2015, 06/01/2016   PFIZER([place])SARS-COV-2 Vaccination 12/13/2019, 01/13/2020, 07/05/2020   Pneumococcal Conjugate-13 06/03/2016   Pneumococcal-Unspecified 11/30/2013   Tdap 08/30/2012    Conditions to be addressed/monitored:  Hyperlipidemia, Diabetes, Heart Failure, Chronic Kidney Disease and Depression  There are no care plans that you recently modified to display for this patient.    Medication Assistance: None required.  Patient affirms current coverage meets needs.  Patient's preferred pharmacy is:  [facility], [address] [place] [address] Phone: [phone] Fax: [facility] (New Address) - [place], [place] - [address] AT Previously: [facility] [address], [place] [address] [place] [address] Phone: [phone] Fax: [phone]  [facility] - [place], [place] - [address] [place] [address] Phone: [phone] Fax: [phone]  Uses pill box? Yes Pt endorses fair compliance  Verbal consent obtained for [facility] enhanced pharmacy services (medication synchronization, adherence packaging, delivery coordination). A medication sync plan was created to allow patient to get all medications delivered once every 30 to 90 days per patient preference. Patient understands they have freedom to choose pharmacy and clinical pharmacist will coordinate care between all prescribers and [facility].   We discussed: Benefits of medication synchronization, packaging and delivery as well as enhanced pharmacist oversight with [facility]. Patient decided to: Utilize [facility] for medication  synchronization, packaging and delivery  Care Plan and Follow Up Patient Decision:  Patient agrees to Care Plan and Follow-up.  Plan: Telephone follow up appointment with care management team member scheduled for:  05/02/2021   Pharmacy assistant will follow-up with patient each month and coordinate medication delivery. Patient will contact pharmacist if needed sooner as well.

## 2021-02-26 ENCOUNTER — Telehealth: Payer: Self-pay

## 2021-02-26 ENCOUNTER — Other Ambulatory Visit: Payer: Self-pay

## 2021-02-26 DIAGNOSIS — Z794 Long term (current) use of insulin: Secondary | ICD-10-CM | POA: Diagnosis not present

## 2021-02-26 DIAGNOSIS — E875 Hyperkalemia: Secondary | ICD-10-CM

## 2021-02-26 DIAGNOSIS — E1140 Type 2 diabetes mellitus with diabetic neuropathy, unspecified: Secondary | ICD-10-CM | POA: Diagnosis not present

## 2021-02-26 LAB — COMPREHENSIVE METABOLIC PANEL
ALT: 10 IU/L (ref 0–44)
AST: 14 IU/L (ref 0–40)
Albumin/Globulin Ratio: 1.6 (ref 1.2–2.2)
Albumin: 4 g/dL (ref 3.7–4.7)
Alkaline Phosphatase: 123 IU/L — ABNORMAL HIGH (ref 44–121)
BUN/Creatinine Ratio: 7 — ABNORMAL LOW (ref 10–24)
BUN: 14 mg/dL (ref 8–27)
Bilirubin Total: 0.7 mg/dL (ref 0.0–1.2)
CO2: 16 mmol/L — ABNORMAL LOW (ref 20–29)
Calcium: 9 mg/dL (ref 8.6–10.2)
Chloride: 109 mmol/L — ABNORMAL HIGH (ref 96–106)
Creatinine, Ser: 2.04 mg/dL — ABNORMAL HIGH (ref 0.76–1.27)
Globulin, Total: 2.5 g/dL (ref 1.5–4.5)
Glucose: 96 mg/dL (ref 65–99)
Potassium: 6.2 mmol/L (ref 3.5–5.2)
Sodium: 142 mmol/L (ref 134–144)
Total Protein: 6.5 g/dL (ref 6.0–8.5)
eGFR: 34 mL/min/{1.73_m2} — ABNORMAL LOW (ref >59)

## 2021-02-26 LAB — CBC WITH DIFFERENTIAL/PLATELET
Basophils Absolute: 0 10*3/uL (ref 0.0–0.2)
Basos: 1 %
EOS (ABSOLUTE): 0 10*3/uL (ref 0.0–0.4)
Eos: 0 %
Hematocrit: 45.1 % (ref 37.5–51.0)
Hemoglobin: 13.9 g/dL (ref 13.0–17.7)
Immature Grans (Abs): 0 10*3/uL (ref 0.0–0.1)
Immature Granulocytes: 0 %
Lymphocytes Absolute: 1.2 10*3/uL (ref 0.7–3.1)
Lymphs: 19 %
MCH: 25.8 pg — ABNORMAL LOW (ref 26.6–33.0)
MCHC: 30.8 g/dL — ABNORMAL LOW (ref 31.5–35.7)
MCV: 84 fL (ref 79–97)
Monocytes Absolute: 0.3 10*3/uL (ref 0.1–0.9)
Monocytes: 6 %
Neutrophils Absolute: 4.6 10*3/uL (ref 1.4–7.0)
Neutrophils: 74 %
Platelets: 202 10*3/uL (ref 150–450)
RBC: 5.39 x10E6/uL (ref 4.14–5.80)
RDW: 13.8 % (ref 11.6–15.4)
WBC: 6.2 10*3/uL (ref 3.4–10.8)

## 2021-02-26 LAB — HEMOGLOBIN A1C
Est. average glucose Bld gHb Est-mCnc: 200 mg/dL
Hgb A1c MFr Bld: 8.6 % — ABNORMAL HIGH (ref 4.8–5.6)

## 2021-02-26 NOTE — Chronic Care Management (AMB) (Signed)
  Chronic Care Management Pharmacy Assistant   Name: Jon [name]  MRN: [id number] DOB: 06/06/1951  Reason for Encounter: [facility] Onboard Process    Medications: Outpatient Encounter Medications as of 02/26/2021  Medication Sig   albuterol (PROVENTIL) (2.5 MG/3ML) 0.083% nebulizer solution Take 3 mLs (2.5 mg total) by nebulization 2 (two) times daily as needed for wheezing.   atorvastatin (LIPITOR) 40 MG tablet Take 1 tablet (40 mg total) by mouth daily.   Azelastine HCl 0.15 % SOLN USE 1 SPRAY IN EACH NOSTRIL TWICE DAILY   clopidogrel (PLAVIX) 75 MG tablet Take 1 tablet (75 mg total) by mouth daily.   Continuous Blood Gluc Receiver (DEXCOM G6 RECEIVER) [place] 1 each by Does not apply route 4 (four) times daily -  before meals and at bedtime.   Continuous Blood Gluc Sensor (DEXCOM G6 SENSOR) MISC Apply every 10 days   diltiazem (CARDIZEM CD) 120 MG 24 hr capsule Take 1 capsule (120 mg total) by mouth daily.   docusate sodium (COLACE) 100 MG capsule Take 100 mg by mouth 2 (two) times daily.   famotidine (PEPCID) 40 MG tablet Take 1 tablet (40 mg total) by mouth 2 (two) times daily.   ferrous sulfate 325 (65 FE) MG tablet Take 325 mg by mouth daily.   Glucagon, rDNA, (GLUCAGON EMERGENCY) 1 MG KIT Inject 1 mg as directed as needed (low blood sugar).    hydrALAZINE (APRESOLINE) 100 MG tablet TAKE ONE (1) TABLET BY MOUTH 3 TIMES DAILY. DISCONTINE DILTIAZEM.   hydrOXYzine (ATARAX/VISTARIL) 50 MG tablet Take 1 tablet (50 mg total) by mouth 3 (three) times daily as needed.   insulin detemir (LEVEMIR FLEXTOUCH) 100 UNIT/ML FlexPen Inject 40 Units into the skin daily.   insulin lispro (HUMALOG KWIKPEN) 100 UNIT/ML KwikPen PER SLIDING SCALE ADMINSTER AS FOLLOWS: >150-200 2 U,201-250 4 U,251-300 8 U, 301-350 12 U,351-400 16 U,>400 20 U   Insulin Pen Needle (BD PEN NEEDLE NANO 2ND GEN) 32G X 4 MM MISC USE DAILY WITH BASAGLAR   isosorbide mononitrate (IMDUR) 120 MG 24 hr tablet TAKE 1 TABLET BY MOUTH  ONCE DAILY IN THEMORNINGS   lactulose (CHRONULAC) 10 GM/15ML solution TAKE 30MLS BY MOUTH IN THE MORNING AND AT BEDTIME   metoprolol succinate (TOPROL-XL) 50 MG 24 hr tablet Take 1 tablet (50 mg total) by mouth daily. Take with or immediately following a meal.   mometasone-formoterol (DULERA) 200-5 MCG/ACT AERO Inhale 2 puffs into the lungs 2 (two) times daily as needed for wheezing.   nitroGLYCERIN (NITROSTAT) 0.4 MG SL tablet Place 0.4 mg under the tongue every 5 (five) minutes as needed for chest pain.    OXYGEN Inhale into the lungs. 2 liters at bedtime   pantoprazole (PROTONIX) 40 MG tablet Take 1 tablet (40 mg total) by mouth in the morning.   potassium chloride SA (KLOR-CON) 20 MEQ tablet Take 3 tablets (60 mEq total) by mouth 2 (two) times daily.   pregabalin (LYRICA) 75 MG capsule TAKE 1 CAPSULE BY MOUTH TWICE DAILY   Rivaroxaban (XARELTO) 15 MG TABS tablet Take 1 tablet (15 mg total) by mouth at bedtime.   Semaglutide, 1 MG/DOSE, (OZEMPIC, 1 MG/DOSE,) 2 MG/1.5ML SOPN Inject 1 mg into the skin once a week.   sertraline (ZOLOFT) 50 MG tablet Take 1 tablet (50 mg total) by mouth daily.   tamsulosin (FLOMAX) 0.4 MG CAPS capsule TAKE 2 CAPSULES BY MOUTH DAILY AFTER SUPPER   torsemide (DEMADEX) 20 MG tablet TAKE 4 TABLETS BY MOUTH   DAILY   Vitamin D, Ergocalciferol, (DRISDOL) 1.25 MG (50000 UNIT) CAPS capsule Take 1 capsule (50,000 Units total) by mouth once a week.   XTAMPZA ER 27 MG C12A TAKE 1 CAPSULE BY MOUTH TWICE DAILY   No facility-administered encounter medications on file as of 02/26/2021.   Contacted patient to review medication counts for [facility] Onboard.  Unable to reach patient at this time. Onboard form has been uploaded to patient documents until patient is reached.    Margaretmary Dys, [facility] Pharmacy Assistant [phone]

## 2021-02-27 ENCOUNTER — Telehealth: Payer: Self-pay

## 2021-02-27 LAB — LIPID PANEL W/O CHOL/HDL RATIO
Cholesterol, Total: 102 mg/dL (ref 100–199)
HDL: 47 mg/dL (ref >39)
LDL Chol Calc (NIH): 38 mg/dL (ref 0–99)
Triglycerides: 86 mg/dL (ref 0–149)
VLDL Cholesterol Cal: 17 mg/dL (ref 5–40)

## 2021-02-27 LAB — CARDIOVASCULAR RISK ASSESSMENT

## 2021-02-27 LAB — SPECIMEN STATUS REPORT

## 2021-02-27 NOTE — Telephone Encounter (Signed)
Pt calling and states his kidney specialist called this morning and advised him to no longer take potassium. Pt states " they need to get their [stuff] together." Please advise pt next step w/ potassium. Pt does not know what to do.  Royce Macadamia, [place] 02/27/21 10:44 AM

## 2021-02-27 NOTE — Telephone Encounter (Signed)
Made pt aware. Pt VU. Pt already had 7/7 lab visit scheduled. Kept that appointment.   Jon [name], [place] 02/27/21 5:14 PM

## 2021-02-28 DIAGNOSIS — G4733 Obstructive sleep apnea (adult) (pediatric): Secondary | ICD-10-CM | POA: Diagnosis not present

## 2021-02-28 NOTE — Progress Notes (Signed)
02/28/21- LM for patient to call about onboarding form.  I have updated his existing form with approximate refill dates.   Clarita Leber, [place] Pharmacist Assistant [phone]

## 2021-03-01 ENCOUNTER — Ambulatory Visit: Payer: Medicare Other

## 2021-03-07 ENCOUNTER — Other Ambulatory Visit: Payer: Medicare Other

## 2021-03-14 ENCOUNTER — Telehealth: Payer: Self-pay

## 2021-03-14 NOTE — Progress Notes (Signed)
Chronic Care Management Pharmacy Assistant   Name: Jon [name]  MRN: [id number] DOB: 1951-03-26  Reason for Encounter: Onboarding process    Medications: Outpatient Encounter Medications as of 03/14/2021  Medication Sig   albuterol (PROVENTIL) (2.5 MG/3ML) 0.083% nebulizer solution Take 3 mLs (2.5 mg total) by nebulization 2 (two) times daily as needed for wheezing.   atorvastatin (LIPITOR) 40 MG tablet Take 1 tablet (40 mg total) by mouth daily.   Azelastine HCl 0.15 % SOLN USE 1 SPRAY IN EACH NOSTRIL TWICE DAILY   clopidogrel (PLAVIX) 75 MG tablet Take 1 tablet (75 mg total) by mouth daily.   Continuous Blood Gluc Receiver (DEXCOM G6 RECEIVER) DEVI 1 each by Does not apply route 4 (four) times daily -  before meals and at bedtime.   Continuous Blood Gluc Sensor (DEXCOM G6 SENSOR) MISC Apply every 10 days   diltiazem (CARDIZEM CD) 120 MG 24 hr capsule Take 1 capsule (120 mg total) by mouth daily.   docusate sodium (COLACE) 100 MG capsule Take 100 mg by mouth 2 (two) times daily.   famotidine (PEPCID) 40 MG tablet Take 1 tablet (40 mg total) by mouth 2 (two) times daily.   ferrous sulfate 325 (65 FE) MG tablet Take 325 mg by mouth daily.   Glucagon, rDNA, (GLUCAGON EMERGENCY) 1 MG KIT Inject 1 mg as directed as needed (low blood sugar).    hydrALAZINE (APRESOLINE) 100 MG tablet TAKE ONE (1) TABLET BY MOUTH 3 TIMES DAILY. DISCONTINE DILTIAZEM.   hydrOXYzine (ATARAX/VISTARIL) 50 MG tablet Take 1 tablet (50 mg total) by mouth 3 (three) times daily as needed.   insulin detemir (LEVEMIR FLEXTOUCH) 100 UNIT/ML FlexPen Inject 40 Units into the skin daily.   insulin lispro (HUMALOG KWIKPEN) 100 UNIT/ML KwikPen PER SLIDING SCALE ADMINSTER AS FOLLOWS: >150-200 2 U,201-250 4 U,251-300 8 U, 301-350 12 U,351-400 16 U,>400 20 U   Insulin Pen Needle (BD PEN NEEDLE NANO 2ND GEN) 32G X 4 MM MISC USE DAILY WITH BASAGLAR   isosorbide mononitrate (IMDUR) 120 MG 24 hr tablet TAKE 1 TABLET BY MOUTH ONCE  DAILY IN THEMORNINGS   lactulose (CHRONULAC) 10 GM/15ML solution TAKE 30MLS BY MOUTH IN THE MORNING AND AT BEDTIME   metoprolol succinate (TOPROL-XL) 50 MG 24 hr tablet Take 1 tablet (50 mg total) by mouth daily. Take with or immediately following a meal.   mometasone-formoterol (DULERA) 200-5 MCG/ACT AERO Inhale 2 puffs into the lungs 2 (two) times daily as needed for wheezing.   nitroGLYCERIN (NITROSTAT) 0.4 MG SL tablet Place 0.4 mg under the tongue every 5 (five) minutes as needed for chest pain.    OXYGEN Inhale into the lungs. 2 liters at bedtime   pantoprazole (PROTONIX) 40 MG tablet Take 1 tablet (40 mg total) by mouth in the morning.   potassium chloride SA (KLOR-CON) 20 MEQ tablet Take 3 tablets (60 mEq total) by mouth 2 (two) times daily.   pregabalin (LYRICA) 75 MG capsule TAKE 1 CAPSULE BY MOUTH TWICE DAILY   Rivaroxaban (XARELTO) 15 MG TABS tablet Take 1 tablet (15 mg total) by mouth at bedtime.   Semaglutide, 1 MG/DOSE, (OZEMPIC, 1 MG/DOSE,) 2 MG/1.5ML SOPN Inject 1 mg into the skin once a week.   sertraline (ZOLOFT) 50 MG tablet Take 1 tablet (50 mg total) by mouth daily.   tamsulosin (FLOMAX) 0.4 MG CAPS capsule TAKE 2 CAPSULES BY MOUTH DAILY AFTER SUPPER   torsemide (DEMADEX) 20 MG tablet TAKE 4 TABLETS BY MOUTH DAILY  Vitamin D, Ergocalciferol, (DRISDOL) 1.25 MG (50000 UNIT) CAPS capsule Take 1 capsule (50,000 Units total) by mouth once a week.   XTAMPZA ER 27 MG C12A TAKE 1 CAPSULE BY MOUTH TWICE DAILY   No facility-administered encounter medications on file as of 03/14/2021.   Unable to reach, mailbox is full   Donette Larry CPP, asked me to call the patient so we can complete his onboarding form for [facility].  I have left a message for the patient to contact me  Clarita Leber, [place] Pharmacist Assistant [phone]

## 2021-03-15 ENCOUNTER — Ambulatory Visit: Payer: Medicare Other | Admitting: Family Medicine

## 2021-03-24 DIAGNOSIS — J9610 Chronic respiratory failure, unspecified whether with hypoxia or hypercapnia: Secondary | ICD-10-CM | POA: Diagnosis not present

## 2021-03-26 DIAGNOSIS — J9610 Chronic respiratory failure, unspecified whether with hypoxia or hypercapnia: Secondary | ICD-10-CM | POA: Diagnosis not present

## 2021-04-01 ENCOUNTER — Ambulatory Visit (INDEPENDENT_AMBULATORY_CARE_PROVIDER_SITE_OTHER): Payer: Medicare Other | Admitting: Family Medicine

## 2021-04-01 ENCOUNTER — Other Ambulatory Visit: Payer: Self-pay

## 2021-04-01 VITALS — BP 130/68 | HR 84 | Temp 97.6°F | Resp 18 | Ht 67.0 in | Wt 247.0 lb

## 2021-04-01 DIAGNOSIS — G894 Chronic pain syndrome: Secondary | ICD-10-CM

## 2021-04-01 DIAGNOSIS — R350 Frequency of micturition: Secondary | ICD-10-CM | POA: Diagnosis not present

## 2021-04-01 DIAGNOSIS — I4811 Longstanding persistent atrial fibrillation: Secondary | ICD-10-CM

## 2021-04-01 DIAGNOSIS — R197 Diarrhea, unspecified: Secondary | ICD-10-CM

## 2021-04-01 LAB — POCT URINALYSIS DIPSTICK
Bilirubin, UA: NEGATIVE
Blood, UA: POSITIVE
Glucose, UA: NEGATIVE
Ketones, UA: NEGATIVE
Leukocytes, UA: NEGATIVE
Nitrite, UA: NEGATIVE
Protein, UA: POSITIVE — AB
Spec Grav, UA: 1.025 (ref 1.010–1.025)
Urobilinogen, UA: 0.2 E.U./dL
pH, UA: 6.5 (ref 5.0–8.0)

## 2021-04-01 NOTE — Progress Notes (Signed)
Acute Office Visit  Subjective:    Patient ID: Jon [name], male    DOB: 01/07/1951, 70 y.o.   MRN: [id number]  Chief Complaint  Patient presents with   Diarrhea   Nausea    HPI Patient is in today for diarrhea, nausea, but no vomiting x 3 weeks.. Pt has not taking colace x 2 weeks and no lactulose for 1 week. No abdominal pain.   Pt says he has been out of several of his medicines for 3 weeks including his xarelto and his xtampza. I spoke with [facility] and he has had several medicines awaiting him to pick up. Per pharmacy the patient picked up his xarelto rx 3 weeks ago and he should not be out. He has not picked up his xtampza since May 2022.   DM: Per pt he has been out of his insulin. Pt is on ozempic 1 mg weekly. His sugars he says have been good (80-140.)  Past Medical History:  Diagnosis Date   Abnormal EKG 11/29/2014   Acute renal insufficiency 11/29/2014   Acute venous embolism and thrombosis of deep vessels of distal lower extremity ([place]) 11/13/2019   Need to check venous dopplers for possible DVT due to tissure injury   Angina    Arthritis    "knees" (01/18/2014)   Asthma    Atrial fibrillation ([place])    Back pain 01/25/2015   Bariatric surgery status 11/03/2013   BOOP (bronchiolitis obliterans with organizing pneumonia) ([place]) 01/20/2014   OLBx 01/20/14 BOOP Started steroids 02/28/14    CHF (congestive heart failure) (HCC)    Chronic anticoagulation-Xarelto 02/09/2014   Chronic atrial fibrillation ([place]) 01/03/2014   Chronic bronchitis ([place]) 12/21/2013   Chronic diastolic heart failure ([place]) 02/22/2015   Chronic pain of both knees 03/18/2017   Overview:  Added automatically from request for surgery [id number]   Chronic respiratory failure ([place]) 04/10/2015   Chronic respiratory failure with hypoxia ([place]) 02/11/2020   COPD (chronic obstructive pulmonary disease) ([place]) 01/25/2015   Cough    coughing up blood- started last nite, seems to be worse now   CVA (cerebral vascular  accident) ([place]) 02/09/2014   Diabetic polyneuropathy associated with type 2 diabetes mellitus ([place]) 12/21/2013   Diabetic ulcer of toe of right foot associated with type 2 diabetes mellitus, with necrosis of bone ([place])    DM (diabetes mellitus), type 2 with renal complications ([place]) 04/08/5026   Drug induced constipation 11/10/2017   Dysrhythmia    atrial fib takes cardizem,    Fibromyalgia    GERD (gastroesophageal reflux disease)    H/O hiatal hernia    Hemoptysis 08/01/2015   Herpes zoster 06/26/2015   History of stroke    Hyperkalemia-repeat pending 11/29/2014   Hyperlipemia    Hyperlipidemia    Hypertension    Hypertensive heart disease with heart failure (HCC)    Hypothyroidism    LBBB (left bundle branch block) 02/22/2015   Long term (current) use of anticoagulants 03/21/2014   Mild CAD 02/20/2015   Overview:  On recent cardiac cath  Overview:  On recent cardiac cath   Myocardial infarction [facility])    "one dr says yes; another says no"   Neuromuscular disorder ([place])    rls, neuropathy   Neuropathy    rls, neuropathy    Normal coronary arteries 2011 11/29/2014   Obesity (BMI 30-39.9)    On home oxygen therapy    "2L w/CPAP at bedtime" (09/01/2018)   OSA on CPAP  cpap & oxygen   Pericardial effusion    Peripheral vascular disease (HCC)    Peritoneal effusion, chronic 02/22/2015   Overview:  With surgical window   PNA (pneumonia) 04/10/2015   Pneumonia 5/15   "several times" (01/18/2014)   Precordial pain 01/26/2015   Preoperative cardiovascular examination 02/24/2016   Primary osteoarthritis of both knees 03/18/2017   Overview:  Added automatically from request for surgery [id number]   PVD (peripheral vascular disease) ([place]) 09/28/2018   Restless leg syndrome    Sleep apnea    cpap & oxygen    Stroke ([place]) 2012   denies residual on 01/18/2014   Troponin level elevated 11/29/2014   Uncontrolled type 2 diabetes mellitus with diabetic polyneuropathy, with long-term current use of  insulin ([place]) 12/21/2013   type ?     Past Surgical History:  Procedure Laterality Date   ABDOMINAL AORTOGRAM W/LOWER EXTREMITY N/A 09/08/2018   Procedure: ABDOMINAL AORTOGRAM W/LOWER EXTREMITY;  Surgeon: Marty Heck, MD;  Location: [facility];  Service: Cardiovascular;  Laterality: N/A;   AMPUTATION Right 09/09/2018   Procedure: AMPUTATION RIGHT GREAT TOE;  Surgeon: Waynetta Sandy, MD;  Location: [facility];  Service: Vascular;  Laterality: Right;   AMPUTATION TOE Right 05/04/2020   Procedure: AMPUTATION TOE RIGHT 2nd TOE;  Surgeon: Evelina Bucy, DPM;  Location: [facility];  Service: Podiatry;  Laterality: Right;   APPENDECTOMY     CARDIAC CATHETERIZATION  X 2 then 03/03/2012    NL LVF, normal coronaries, vessels are small (HPR: Dr. Beatrix Fetters)   CATARACT EXTRACTION W/ INTRAOCULAR LENS  IMPLANT, BILATERAL Bilateral    CHOLECYSTECTOMY     HERNIA REPAIR     UHR   LAPAROSCOPIC GASTRIC BANDING  2010   LOWER EXTREMITY ANGIOGRAPHY Left 10/11/2018   Procedure: LOWER EXTREMITY ANGIOGRAPHY;  Surgeon: Waynetta Sandy, MD;  Location: [facility];  Service: Cardiovascular;  Laterality: Left;   PERICARDIAL WINDOW Left 01/24/2014   Procedure: PERICARDIAL WINDOW;  Surgeon: Gaye Pollack, MD;  Location: [place] [facility];  Service: Thoracic;  Laterality: Left;   PERIPHERAL VASCULAR INTERVENTION Right 09/08/2018   Procedure: PERIPHERAL VASCULAR INTERVENTION;  Surgeon: Marty Heck, MD;  Location: [facility];  Service: Cardiovascular;  Laterality: Right;   SINUS EXPLORATION  X 2   TEE WITHOUT CARDIOVERSION N/A 09/07/2018   Procedure: TRANSESOPHAGEAL ECHOCARDIOGRAM (TEE);  Surgeon: Acie Fredrickson Wonda Cheng, MD;  Location: [facility];  Service: Cardiovascular;  Laterality: N/A;   UMBILICAL HERNIA REPAIR     VIDEO ASSISTED THORACOSCOPY Left 01/24/2014   Procedure: VIDEO ASSISTED THORACOSCOPY;  Surgeon: Gaye Pollack, MD;  Location: [place] [facility];  Service: Thoracic;  Laterality: Left;   VATS/open lung biopsy   VIDEO BRONCHOSCOPY Bilateral 12/29/2013   Procedure: VIDEO BRONCHOSCOPY WITHOUT FLUORO;  Surgeon: Elsie Stain, MD;  Location: [facility];  Service: Endoscopy;  Laterality: Bilateral;   VIDEO BRONCHOSCOPY N/A 01/24/2014   Procedure: VIDEO BRONCHOSCOPY;  Surgeon: Gaye Pollack, MD;  Location: [facility];  Service: Thoracic;  Laterality: N/A;    Family History  Problem Relation Age of Onset   Cancer Mother    Stroke Father    Heart disease Father    Seizures Sister    Diabetes Sister    Anesthesia problems Neg Hx    Hypotension Neg Hx    Malignant hyperthermia Neg Hx    Pseudochol deficiency Neg Hx     Social History   Socioeconomic History   Marital status: Widowed  Spouse name: Not on file   Number of children: 3   Years of education: Not on file   Highest education level: Not on file  Occupational History   Not on file  Tobacco Use   Smoking status: Former    Types: Cigars    Quit date: 09/01/2006    Years since quitting: 14.6   Smokeless tobacco: Never  Vaping Use   Vaping Use: Never used  Substance and Sexual Activity   Alcohol use: No    Comment: "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; quit in 8850-2774"   Drug use: No   Sexual activity: Never  Other Topics Concern   Not on file  Social History Narrative   Not on file   Social Determinants of Health   Financial Resource Strain: Not on file  Food Insecurity: Not on file  Transportation Needs: Not on file  Physical Activity: Not on file  Stress: Not on file  Social Connections: Not on file  Intimate Partner Violence: Not on file    Outpatient Medications Prior to Visit  Medication Sig Dispense Refill   albuterol (PROVENTIL) (2.5 MG/3ML) 0.083% nebulizer solution Take 3 mLs (2.5 mg total) by nebulization 2 (two) times daily as needed for wheezing. 200 mL 1   atorvastatin (LIPITOR) 40 MG tablet Take 1 tablet (40 mg total) by mouth daily. 90 tablet 1   Azelastine HCl 0.15 % SOLN USE 1 SPRAY IN  EACH NOSTRIL TWICE DAILY 30 mL 11   clopidogrel (PLAVIX) 75 MG tablet Take 1 tablet (75 mg total) by mouth daily. 90 tablet 1   Continuous Blood Gluc Receiver (DEXCOM G6 RECEIVER) DEVI 1 each by Does not apply route 4 (four) times daily -  before meals and at bedtime. 1 each 0   Continuous Blood Gluc Sensor (DEXCOM G6 SENSOR) MISC Apply every 10 days 3 each 3   diltiazem (CARDIZEM CD) 120 MG 24 hr capsule Take 1 capsule (120 mg total) by mouth daily. 90 capsule 1   famotidine (PEPCID) 40 MG tablet Take 1 tablet (40 mg total) by mouth 2 (two) times daily. 180 tablet 1   ferrous sulfate 325 (65 FE) MG tablet Take 325 mg by mouth daily.     Glucagon, rDNA, (GLUCAGON EMERGENCY) 1 MG KIT Inject 1 mg as directed as needed (low blood sugar).      hydrALAZINE (APRESOLINE) 100 MG tablet TAKE ONE (1) TABLET BY MOUTH 3 TIMES DAILY. DISCONTINE DILTIAZEM. 90 tablet 2   hydrOXYzine (ATARAX/VISTARIL) 50 MG tablet Take 1 tablet (50 mg total) by mouth 3 (three) times daily as needed. 270 tablet 1   Insulin Pen Needle (BD PEN NEEDLE NANO 2ND GEN) 32G X 4 MM MISC USE DAILY WITH BASAGLAR 100 each 3   isosorbide mononitrate (IMDUR) 120 MG 24 hr tablet TAKE 1 TABLET BY MOUTH ONCE DAILY IN THEMORNINGS 90 tablet 1   lactulose (CHRONULAC) 10 GM/15ML solution TAKE 30MLS BY MOUTH IN THE MORNING AND AT BEDTIME 946 mL 2   latanoprost (XALATAN) 0.005 % ophthalmic solution SMARTSIG:In Eye(s)     metoprolol succinate (TOPROL-XL) 50 MG 24 hr tablet Take 1 tablet (50 mg total) by mouth daily. Take with or immediately following a meal. 90 tablet 1   mometasone-formoterol (DULERA) 200-5 MCG/ACT AERO Inhale 2 puffs into the lungs 2 (two) times daily as needed for wheezing.     nitroGLYCERIN (NITROSTAT) 0.4 MG SL tablet Place 0.4 mg under the tongue every 5 (five) minutes as needed for chest  pain.      OXYGEN Inhale into the lungs. 2 liters at bedtime     pantoprazole (PROTONIX) 40 MG tablet Take 1 tablet (40 mg total) by mouth in the  morning. 90 tablet 3   potassium chloride SA (KLOR-CON) 20 MEQ tablet Take 3 tablets (60 mEq total) by mouth 2 (two) times daily. 360 tablet 0   pregabalin (LYRICA) 75 MG capsule TAKE 1 CAPSULE BY MOUTH TWICE DAILY 180 capsule 2   Rivaroxaban (XARELTO) 15 MG TABS tablet Take 1 tablet (15 mg total) by mouth at bedtime. 90 tablet 1   Semaglutide, 1 MG/DOSE, (OZEMPIC, 1 MG/DOSE,) 2 MG/1.5ML SOPN Inject 1 mg into the skin once a week. 6 mL 3   sertraline (ZOLOFT) 50 MG tablet Take 1 tablet (50 mg total) by mouth daily. 90 tablet 1   tamsulosin (FLOMAX) 0.4 MG CAPS capsule TAKE 2 CAPSULES BY MOUTH DAILY AFTER SUPPER 180 capsule 1   torsemide (DEMADEX) 20 MG tablet TAKE 4 TABLETS BY MOUTH DAILY 1 tablet 0   Vitamin D, Ergocalciferol, (DRISDOL) 1.25 MG (50000 UNIT) CAPS capsule Take 1 capsule (50,000 Units total) by mouth once a week. 12 capsule 1   docusate sodium (COLACE) 100 MG capsule Take 100 mg by mouth 2 (two) times daily.     insulin detemir (LEVEMIR FLEXTOUCH) 100 UNIT/ML FlexPen Inject 40 Units into the skin daily. 15 mL 2   insulin lispro (HUMALOG KWIKPEN) 100 UNIT/ML KwikPen PER SLIDING SCALE ADMINSTER AS FOLLOWS: >150-200 2 U,201-250 4 U,251-300 8 U, 301-350 12 U,351-400 16 U,>400 20 U 15 mL 1   XTAMPZA ER 27 MG C12A TAKE 1 CAPSULE BY MOUTH TWICE DAILY 60 capsule 0   No facility-administered medications prior to visit.    Allergies  Allergen Reactions   Hydrocodone Itching   Morphine Itching   Duloxetine Hcl Other (See Comments)    Unknown reaction   Codeine Itching   Penicillins Itching and Rash    DID THE REACTION INVOLVE: Swelling of the face/tongue/throat, SOB, or low BP? No Sudden or severe rash/hives, skin peeling, or the inside of the mouth or nose? No Did it require medical treatment? No When did it last happen?      unknown If all above answers are "NO", may proceed with cephalosporin use.     Review of Systems  Constitutional:  Positive for appetite change and  chills. Negative for fever.  HENT:  Negative for congestion, rhinorrhea and sore throat.   Respiratory:  Positive for shortness of breath. Negative for cough.   Cardiovascular:  Positive for chest pain. Negative for palpitations.  Gastrointestinal:  Positive for diarrhea and nausea. Negative for abdominal pain, constipation and vomiting.  Genitourinary:  Positive for dysuria. Negative for urgency.  Musculoskeletal:  Positive for arthralgias, back pain (pt says it is not to bad without xtampza.) and myalgias.  Neurological:  Negative for dizziness and headaches.  Psychiatric/Behavioral:  Negative for dysphoric mood. The patient is not nervous/anxious.       Objective:    Physical Exam Vitals reviewed.  Constitutional:      Appearance: Normal appearance.  Neck:     Vascular: No carotid bruit.  Cardiovascular:     Rate and Rhythm: Normal rate. Rhythm irregular.     Pulses: Normal pulses.     Heart sounds: Normal heart sounds.  Pulmonary:     Effort: Pulmonary effort is normal.     Breath sounds: Normal breath sounds. No wheezing, rhonchi or rales.  Abdominal:     General: Bowel sounds are normal.     Palpations: Abdomen is soft.     Tenderness: There is no abdominal tenderness.  Neurological:     Mental Status: He is alert.  Psychiatric:        Mood and Affect: Mood normal.        Behavior: Behavior normal.    BP 130/68 (BP Location: Left Arm, Patient Position: Sitting)   Pulse 84   Temp 97.6 F (36.4 C)   Resp 18   Ht _0  (1.702 m)   Wt 247 lb (112 kg)   BMI 38.69 kg/m  Wt Readings from Last 3 Encounters:  04/01/21 247 lb (112 kg)  12/03/20 266 lb (120.7 kg)  11/14/20 250 lb (113.4 kg)    Health Maintenance Due  Topic Date Due   Hepatitis C Screening  Never done   Zoster Vaccines- Shingrix (1 of 2) Never done   FOOT EXAM  02/28/2016   COVID-19 Vaccine (4 - Booster for Pfizer series) 10/05/2020   INFLUENZA VACCINE  04/01/2021    There are no preventive care  reminders to display for this patient.   Lab Results  Component Value Date   TSH 1.030 11/14/2020   Lab Results  Component Value Date   WBC 6.2 02/26/2021   HGB 13.9 02/26/2021   HCT 45.1 02/26/2021   MCV 84 02/26/2021   PLT 202 02/26/2021   Lab Results  Component Value Date   NA 142 02/25/2021   K 6.2 (HH) 02/25/2021   CO2 16 (L) 02/25/2021   GLUCOSE 96 02/25/2021   BUN 14 02/25/2021   CREATININE 2.04 (H) 02/25/2021   BILITOT 0.7 02/25/2021   ALKPHOS 123 (H) 02/25/2021   AST 14 02/25/2021   ALT 10 02/25/2021   PROT 6.5 02/25/2021   ALBUMIN 4.0 02/25/2021   CALCIUM 9.0 02/25/2021   ANIONGAP 13 05/01/2020   EGFR 34 (L) 02/25/2021   Lab Results  Component Value Date   CHOL 102 02/25/2021   Lab Results  Component Value Date   HDL 47 02/25/2021   Lab Results  Component Value Date   LDLCALC 38 02/25/2021   Lab Results  Component Value Date   TRIG 86 02/25/2021   Lab Results  Component Value Date   CHOLHDL 2.6 11/14/2020   Lab Results  Component Value Date   HGBA1C 8.6 (H) 02/26/2021       Assessment & Plan:  1. Frequency of urination - POCT urinalysis dipstick  2. Diarrhea, unspecified type - Cdiff NAA+O+P+Stool Culture  3. Chronic pain syndrome I will not restart xtampza as pt has been off of it for around 2 months per the pharmacy. I will check UDS.  - Pain Mgt Scrn (14 Drugs), Ur  4. Longstanding persistent atrial fibrillation (HCC)  Xarelto 15 mg once daily samples given.  5. Diabetes complicated by neuropathy.  The pt was instructed to pick up rxs except insulin and xtampza which were discontinued.   Orders Placed This Encounter  Procedures   Cdiff NAA+O+P+Stool Culture   Pain Mgt Scrn (14 Drugs), Ur   POCT urinalysis dipstick    Follow-up: Return in about 8 weeks (around 05/30/2021) for fasting.  An After Visit Summary was printed and given to the patient.  Rochel Brome, MD [facility] [phone]

## 2021-04-01 NOTE — Patient Instructions (Signed)
Xarelto 15 mg samples given. Take once daily.  I am going to call [facility].

## 2021-04-02 LAB — PAIN MGT SCRN (14 DRUGS), UR
Amphetamine Scrn, Ur: NEGATIVE ng/mL
BARBITURATE SCREEN URINE: NEGATIVE ng/mL
BENZODIAZEPINE SCREEN, URINE: NEGATIVE ng/mL
Buprenorphine, Urine: NEGATIVE ng/mL
CANNABINOIDS UR QL SCN: NEGATIVE ng/mL
Cocaine (Metab) Scrn, Ur: NEGATIVE ng/mL
Creatinine(Crt), U: 216.9 mg/dL (ref 20.0–300.0)
Fentanyl, Urine: NEGATIVE pg/mL
Meperidine Screen, Urine: NEGATIVE ng/mL
Methadone Screen, Urine: NEGATIVE ng/mL
OXYCODONE+OXYMORPHONE UR QL SCN: NEGATIVE ng/mL
Opiate Scrn, Ur: NEGATIVE ng/mL
Ph of Urine: 5.3 (ref 4.5–8.9)
Phencyclidine Qn, Ur: NEGATIVE ng/mL
Propoxyphene Scrn, Ur: NEGATIVE ng/mL
Tramadol Screen, Urine: NEGATIVE ng/mL

## 2021-04-07 ENCOUNTER — Encounter: Payer: Self-pay | Admitting: Family Medicine

## 2021-04-07 ENCOUNTER — Other Ambulatory Visit: Payer: Self-pay | Admitting: Family Medicine

## 2021-04-17 ENCOUNTER — Telehealth: Payer: Self-pay

## 2021-04-17 NOTE — Chronic Care Management (AMB) (Signed)
Chronic Care Management Pharmacy Assistant   Name: Jon [name]  MRN: [id number] DOB: [birth year]  Reason for Encounter: Diabetes Mellitus Disease State Call   Recent office visits:  04/01/21- Rochel Brome, MD- seen for frequency of urination, xarelto samples given, follow up 8 weeks  Recent consult visits:  No visits noted  Hospital visits:  None in previous 6 months  Medications: Outpatient Encounter Medications as of 04/17/2021  Medication Sig   albuterol (PROVENTIL) (2.5 MG/3ML) 0.083% nebulizer solution Take 3 mLs (2.5 mg total) by nebulization 2 (two) times daily as needed for wheezing.   atorvastatin (LIPITOR) 40 MG tablet Take 1 tablet (40 mg total) by mouth daily.   Azelastine HCl 0.15 % SOLN USE 1 SPRAY IN EACH NOSTRIL TWICE DAILY   clopidogrel (PLAVIX) 75 MG tablet Take 1 tablet (75 mg total) by mouth daily.   Continuous Blood Gluc Receiver (DEXCOM G6 RECEIVER) DEVI 1 each by Does not apply route 4 (four) times daily -  before meals and at bedtime.   Continuous Blood Gluc Sensor (DEXCOM G6 SENSOR) MISC Apply every 10 days   diltiazem (CARDIZEM CD) 120 MG 24 hr capsule Take 1 capsule (120 mg total) by mouth daily.   famotidine (PEPCID) 40 MG tablet Take 1 tablet (40 mg total) by mouth 2 (two) times daily.   ferrous sulfate 325 (65 FE) MG tablet Take 325 mg by mouth daily.   Glucagon, rDNA, (GLUCAGON EMERGENCY) 1 MG KIT Inject 1 mg as directed as needed (low blood sugar).    hydrALAZINE (APRESOLINE) 100 MG tablet TAKE ONE (1) TABLET BY MOUTH 3 TIMES DAILY. DISCONTINE DILTIAZEM.   hydrOXYzine (ATARAX/VISTARIL) 50 MG tablet Take 1 tablet (50 mg total) by mouth 3 (three) times daily as needed.   Insulin Pen Needle (BD PEN NEEDLE NANO 2ND GEN) 32G X 4 MM MISC USE DAILY WITH BASAGLAR   isosorbide mononitrate (IMDUR) 120 MG 24 hr tablet TAKE 1 TABLET BY MOUTH ONCE DAILY IN THEMORNINGS   latanoprost (XALATAN) 0.005 % ophthalmic solution SMARTSIG:In Eye(s)   metoprolol  succinate (TOPROL-XL) 50 MG 24 hr tablet Take 1 tablet (50 mg total) by mouth daily. Take with or immediately following a meal.   mometasone-formoterol (DULERA) 200-5 MCG/ACT AERO Inhale 2 puffs into the lungs 2 (two) times daily as needed for wheezing.   nitroGLYCERIN (NITROSTAT) 0.4 MG SL tablet Place 0.4 mg under the tongue every 5 (five) minutes as needed for chest pain.    OXYGEN Inhale into the lungs. 2 liters at bedtime   pantoprazole (PROTONIX) 40 MG tablet Take 1 tablet (40 mg total) by mouth in the morning.   potassium chloride SA (KLOR-CON) 20 MEQ tablet Take 3 tablets (60 mEq total) by mouth 2 (two) times daily.   pregabalin (LYRICA) 75 MG capsule TAKE 1 CAPSULE BY MOUTH TWICE DAILY   Rivaroxaban (XARELTO) 15 MG TABS tablet Take 1 tablet (15 mg total) by mouth at bedtime.   Semaglutide, 1 MG/DOSE, (OZEMPIC, 1 MG/DOSE,) 2 MG/1.5ML SOPN Inject 1 mg into the skin once a week.   sertraline (ZOLOFT) 50 MG tablet Take 1 tablet (50 mg total) by mouth daily.   tamsulosin (FLOMAX) 0.4 MG CAPS capsule TAKE 2 CAPSULES BY MOUTH DAILY AFTER SUPPER   torsemide (DEMADEX) 20 MG tablet TAKE 4 TABLETS BY MOUTH DAILY   Vitamin D, Ergocalciferol, (DRISDOL) 1.25 MG (50000 UNIT) CAPS capsule Take 1 capsule (50,000 Units total) by mouth once a week.   No facility-administered encounter medications  on file as of 04/17/2021.    Recent Relevant Labs: Lab Results  Component Value Date/Time   HGBA1C 8.6 (H) 02/26/2021 10:31 AM   HGBA1C 9.2 (H) 11/14/2020 11:53 AM    Kidney Function Lab Results  Component Value Date/Time   CREATININE 2.04 (H) 02/25/2021 04:15 PM   CREATININE 2.39 (H) 12/27/2020 10:35 AM   GFRNONAA 30 (L) 08/28/2020 02:10 PM   GFRAA 35 (L) 08/28/2020 02:10 PM   Several unsuccessful attempts made to contact patient  Current antihyperglycemic regimen:  Glucagon emergency kit Levemir 26 units daily  Novolin R flex pen sliding scale before meals Ozempic 1 mg weekly    Patient  verbally confirms he is taking the above medications as directed.   What recent interventions/DTPs have been made to improve glycemic control:    Have there been any recent hospitalizations or ED visits since last visit with [facility]?    How often are you checking your blood sugar?   What are your blood sugars ranging?  Fasting:  Before meals:  After meals:  Bedtime:   On insulin?  How many units:  During the week, how often does your blood glucose drop below 70?   Are you checking your feet daily/regularly?    Adherence Review: Is the patient currently on a STATIN medication? Yes Is the patient currently on ACE/ARB medication?  Does the patient have >5 day gap between last estimated fill dates?  Glucagon emergency kit Levemir 26 units - 38 DS last filled 01/14/21 Novolin R flex pen sliding scale before meals   Care Gaps: Last eye exam / Retinopathy Screening? 07/11/20 Last Annual Wellness Visit? 03/22/20 Last Diabetic Foot Exam? 02/28/15   Star Rating Drugs:  Medication:  Last Fill: Day Supply Atorvastatin 40 mg 01/03/21 90 DS Ozempic  01/14/21 30 DS  [facility] CPA, CMA

## 2021-04-24 DIAGNOSIS — J9610 Chronic respiratory failure, unspecified whether with hypoxia or hypercapnia: Secondary | ICD-10-CM | POA: Diagnosis not present

## 2021-04-26 DIAGNOSIS — J9610 Chronic respiratory failure, unspecified whether with hypoxia or hypercapnia: Secondary | ICD-10-CM | POA: Diagnosis not present

## 2021-05-04 ENCOUNTER — Telehealth: Payer: Self-pay

## 2021-05-04 NOTE — Telephone Encounter (Signed)
Called pt to schedule an appt. Mailbox was full could not leave message.  KP

## 2021-05-24 ENCOUNTER — Other Ambulatory Visit: Payer: Self-pay | Admitting: Family Medicine

## 2021-05-24 NOTE — Telephone Encounter (Signed)
Refill sent to pharmacy.   

## 2021-05-25 DIAGNOSIS — J9610 Chronic respiratory failure, unspecified whether with hypoxia or hypercapnia: Secondary | ICD-10-CM | POA: Diagnosis not present

## 2021-05-27 DIAGNOSIS — J9610 Chronic respiratory failure, unspecified whether with hypoxia or hypercapnia: Secondary | ICD-10-CM | POA: Diagnosis not present

## 2021-05-31 ENCOUNTER — Other Ambulatory Visit: Payer: Self-pay

## 2021-05-31 ENCOUNTER — Ambulatory Visit (INDEPENDENT_AMBULATORY_CARE_PROVIDER_SITE_OTHER): Payer: Medicare Other | Admitting: Family Medicine

## 2021-05-31 ENCOUNTER — Encounter: Payer: Self-pay | Admitting: Family Medicine

## 2021-05-31 VITALS — BP 150/70 | HR 88 | Temp 97.2°F | Resp 18 | Ht 67.0 in | Wt 244.0 lb

## 2021-05-31 DIAGNOSIS — Z794 Long term (current) use of insulin: Secondary | ICD-10-CM | POA: Diagnosis not present

## 2021-05-31 DIAGNOSIS — Z6838 Body mass index (BMI) 38.0-38.9, adult: Secondary | ICD-10-CM

## 2021-05-31 DIAGNOSIS — R5381 Other malaise: Secondary | ICD-10-CM | POA: Diagnosis not present

## 2021-05-31 DIAGNOSIS — R42 Dizziness and giddiness: Secondary | ICD-10-CM | POA: Diagnosis not present

## 2021-05-31 DIAGNOSIS — Z23 Encounter for immunization: Secondary | ICD-10-CM | POA: Diagnosis not present

## 2021-05-31 DIAGNOSIS — E782 Mixed hyperlipidemia: Secondary | ICD-10-CM

## 2021-05-31 DIAGNOSIS — E1140 Type 2 diabetes mellitus with diabetic neuropathy, unspecified: Secondary | ICD-10-CM

## 2021-05-31 DIAGNOSIS — E1142 Type 2 diabetes mellitus with diabetic polyneuropathy: Secondary | ICD-10-CM | POA: Diagnosis not present

## 2021-05-31 DIAGNOSIS — I482 Chronic atrial fibrillation, unspecified: Secondary | ICD-10-CM

## 2021-05-31 DIAGNOSIS — K5903 Drug induced constipation: Secondary | ICD-10-CM | POA: Diagnosis not present

## 2021-05-31 DIAGNOSIS — F330 Major depressive disorder, recurrent, mild: Secondary | ICD-10-CM

## 2021-05-31 DIAGNOSIS — I739 Peripheral vascular disease, unspecified: Secondary | ICD-10-CM

## 2021-05-31 DIAGNOSIS — J410 Simple chronic bronchitis: Secondary | ICD-10-CM | POA: Diagnosis not present

## 2021-05-31 DIAGNOSIS — J9611 Chronic respiratory failure with hypoxia: Secondary | ICD-10-CM | POA: Diagnosis not present

## 2021-05-31 DIAGNOSIS — E876 Hypokalemia: Secondary | ICD-10-CM

## 2021-05-31 DIAGNOSIS — K219 Gastro-esophageal reflux disease without esophagitis: Secondary | ICD-10-CM

## 2021-05-31 DIAGNOSIS — N1832 Chronic kidney disease, stage 3b: Secondary | ICD-10-CM

## 2021-05-31 DIAGNOSIS — N2581 Secondary hyperparathyroidism of renal origin: Secondary | ICD-10-CM

## 2021-05-31 DIAGNOSIS — M170 Bilateral primary osteoarthritis of knee: Secondary | ICD-10-CM

## 2021-05-31 DIAGNOSIS — Z89619 Acquired absence of unspecified leg above knee: Secondary | ICD-10-CM

## 2021-05-31 DIAGNOSIS — M86171 Other acute osteomyelitis, right ankle and foot: Secondary | ICD-10-CM

## 2021-05-31 DIAGNOSIS — I951 Orthostatic hypotension: Secondary | ICD-10-CM | POA: Diagnosis not present

## 2021-05-31 DIAGNOSIS — R296 Repeated falls: Secondary | ICD-10-CM

## 2021-05-31 DIAGNOSIS — E1169 Type 2 diabetes mellitus with other specified complication: Secondary | ICD-10-CM

## 2021-05-31 DIAGNOSIS — G4733 Obstructive sleep apnea (adult) (pediatric): Secondary | ICD-10-CM | POA: Diagnosis not present

## 2021-05-31 DIAGNOSIS — D6869 Other thrombophilia: Secondary | ICD-10-CM

## 2021-05-31 LAB — CBC WITH DIFFERENTIAL/PLATELET
Basophils Absolute: 0 10*3/uL (ref 0.0–0.2)
Basos: 1 %
EOS (ABSOLUTE): 0 10*3/uL (ref 0.0–0.4)
Eos: 0 %
Hematocrit: 44.5 % (ref 37.5–51.0)
Hemoglobin: 14.8 g/dL (ref 13.0–17.7)
Immature Grans (Abs): 0 10*3/uL (ref 0.0–0.1)
Immature Granulocytes: 0 %
Lymphocytes Absolute: 1.1 10*3/uL (ref 0.7–3.1)
Lymphs: 16 %
MCH: 27.6 pg (ref 26.6–33.0)
MCHC: 33.3 g/dL (ref 31.5–35.7)
MCV: 83 fL (ref 79–97)
Monocytes Absolute: 0.6 10*3/uL (ref 0.1–0.9)
Monocytes: 8 %
Neutrophils Absolute: 5.5 10*3/uL (ref 1.4–7.0)
Neutrophils: 75 %
Platelets: 313 10*3/uL (ref 150–450)
RBC: 5.37 x10E6/uL (ref 4.14–5.80)
RDW: 17.3 % — ABNORMAL HIGH (ref 11.6–15.4)
WBC: 7.2 10*3/uL (ref 3.4–10.8)

## 2021-05-31 LAB — COMPREHENSIVE METABOLIC PANEL
ALT: 16 IU/L (ref 0–44)
AST: 18 IU/L (ref 0–40)
Albumin/Globulin Ratio: 1.3 (ref 1.2–2.2)
Albumin: 2.4 g/dL — ABNORMAL LOW (ref 3.7–4.7)
Alkaline Phosphatase: 131 IU/L — ABNORMAL HIGH (ref 44–121)
BUN/Creatinine Ratio: 4 — ABNORMAL LOW (ref 10–24)
BUN: 7 mg/dL — ABNORMAL LOW (ref 8–27)
Bilirubin Total: 1 mg/dL (ref 0.0–1.2)
CO2: 19 mmol/L — ABNORMAL LOW (ref 20–29)
Calcium: 7.2 mg/dL — ABNORMAL LOW (ref 8.6–10.2)
Chloride: 105 mmol/L (ref 96–106)
Creatinine, Ser: 1.58 mg/dL — ABNORMAL HIGH (ref 0.76–1.27)
Globulin, Total: 1.9 g/dL (ref 1.5–4.5)
Glucose: 205 mg/dL — ABNORMAL HIGH (ref 70–99)
Potassium: 2.6 mmol/L — ABNORMAL LOW (ref 3.5–5.2)
Sodium: 140 mmol/L (ref 134–144)
Total Protein: 4.3 g/dL — CL (ref 6.0–8.5)
eGFR: 46 mL/min/{1.73_m2} — ABNORMAL LOW (ref >59)

## 2021-05-31 LAB — GLUCOSE, POCT (MANUAL RESULT ENTRY): POC Glucose: 220 mg/dl — AB (ref 70–99)

## 2021-05-31 MED ORDER — CLOPIDOGREL BISULFATE 75 MG PO TABS: 75.0000 mg | ORAL_TABLET | Freq: Every day | ORAL | 1 refills | Status: DC

## 2021-05-31 MED ORDER — FAMOTIDINE 40 MG PO TABS: 40.0000 mg | ORAL_TABLET | Freq: Two times a day (BID) | ORAL | 1 refills | Status: DC

## 2021-05-31 MED ORDER — PANTOPRAZOLE SODIUM 40 MG PO TBEC: 40.0000 mg | DELAYED_RELEASE_TABLET | Freq: Every morning | ORAL | 3 refills | Status: DC

## 2021-05-31 MED ORDER — METOPROLOL SUCCINATE ER 50 MG PO TB24: 50.0000 mg | ORAL_TABLET | Freq: Every day | ORAL | 1 refills | Status: DC

## 2021-05-31 MED ORDER — DILTIAZEM HCL ER COATED BEADS 120 MG PO CP24: 120.0000 mg | ORAL_CAPSULE | Freq: Every day | ORAL | 1 refills | Status: DC

## 2021-05-31 NOTE — Progress Notes (Signed)
Subjective:  Patient ID: Jon [name], male    DOB: 19-Jul-1951  Age: 70 y.o. MRN: [id number]  Chief Complaint  Patient presents with   Diabetes   Hyperlipidemia   Hypertension    HPI Pt is a 70 yo WM with Pmhx significant for uncontrolled diabetes, hypertension, hyperlipidemia, diastolic congestive heart failure, osteoarthritis of his knees, atrial fibrillation, COPD with chronic respiratory failure, and nocturnal hypoxemia.  Patient presents for regular chronic follow-up.  Patient is having significant issues with falling over the last 48 to 72 hours. Pt fell twice last night and twice on Wednesday.  Denies hitting his head.  He denies any injuries from his falls.  He describes the falls as slipping from the bed.  Usually when he is trying to get in the bed he slips off. He has a hospital bed, but his mattress is worn out. He needs another mattress. Got it from [place].  CBG today 205.   Diabetes:  Complications: Glucose QQIW:[id number]. Checking three times a day.  Patient has a Dexcom and exactly has had it approved.  He is frustrated and not using it due to its "complexity." Hypoglycemia: none Most recent A1C:8.6 Current medications: Lyrica 75 mg one twice a day. Ozempic 1 mg weekly, lyrica 75 mg twice daily. At the patient's last visit on 04/01/2021 he reported his sugars to be 80-140 with being out of all of his insulin for 3 weeks. For this reason I did not restart his insulin. At the time it was too early to recheck his A1C. He is here or this today.  Foot checks: Daily.  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  Hyperlipidemia: Current medications: Lipitor 40 mg once daily.  Hypertension: Current medications: diltiazem 120 mg daily, hydralazine 100 mg one three times a day, Imdur 120 mg once daily in am, Toprol xl 50 mg once daily.  Patient's blood pressure is elevated today but when orthostatics are done he is very  orthostatic.  Atrial fibrillation On metoprolol, xarelto and diltiazem.   Diet: Poor Exercise: None  GERD: on pantoprazole 40 mg daily and Pepcid 40 mg twice daily.  Patient has a history of chronic pain as well as opiate induced constipation.  At his last visit in early August patient had stopped several of his medications including his X [place].  She claimed the prescriptions were not at the pharmacy however when I called the pharmacy he had 9 prescriptions waiting.  As the patient had been off the [facility] ER for at least 3 weeks and was not complaining of significant increase or change in his pain level I discontinued his narcotics.  In addition he was no longer having constipation.  He had previously been through nearly all of the various constipation agents and was now on none and doing very well as far as his bowel movements.  Current Outpatient Medications on File Prior to Visit  Medication Sig Dispense Refill   albuterol (PROVENTIL) (2.5 MG/3ML) 0.083% nebulizer solution Take 3 mLs (2.5 mg total) by nebulization 2 (two) times daily as needed for wheezing. 200 mL 1   atorvastatin (LIPITOR) 40 MG tablet TAKE 1 TABLET BY MOUTH DAILY 90 tablet 1   Azelastine HCl 0.15 % SOLN USE 1 SPRAY IN EACH NOSTRIL TWICE DAILY 30 mL 11   Continuous Blood Gluc Receiver (DEXCOM G6 RECEIVER) DEVI 1 each by Does not apply route 4 (four) times daily -  before meals  and at bedtime. (Patient not taking: Reported on 06/01/2021) 1 each 0   Continuous Blood Gluc Sensor (DEXCOM G6 SENSOR) MISC Apply every 10 days (Patient not taking: Reported on 06/01/2021) 3 each 3   ferrous sulfate 325 (65 FE) MG tablet Take 325 mg by mouth daily.     Glucagon, rDNA, (GLUCAGON EMERGENCY) 1 MG KIT Inject 1 mg as directed as needed (low blood sugar).      hydrALAZINE (APRESOLINE) 100 MG tablet TAKE ONE (1) TABLET BY MOUTH 3 TIMES DAILY. DISCONTINE DILTIAZEM. (Patient taking differently: Take 100 mg by mouth 3 (three) times daily.) 90  tablet 2   hydrOXYzine (ATARAX/VISTARIL) 50 MG tablet Take 1 tablet (50 mg total) by mouth 3 (three) times daily as needed. 270 tablet 1   Insulin Pen Needle (BD PEN NEEDLE NANO 2ND GEN) 32G X 4 MM MISC USE DAILY WITH BASAGLAR 100 each 3   isosorbide mononitrate (IMDUR) 120 MG 24 hr tablet TAKE 1 TABLET BY MOUTH ONCE DAILY IN THEMORNINGS 90 tablet 1   latanoprost (XALATAN) 0.005 % ophthalmic solution SMARTSIG:In Eye(s)     mometasone-formoterol (DULERA) 200-5 MCG/ACT AERO Inhale 2 puffs into the lungs 2 (two) times daily as needed for wheezing.     nitroGLYCERIN (NITROSTAT) 0.4 MG SL tablet Place 0.4 mg under the tongue every 5 (five) minutes as needed for chest pain.      OXYGEN Inhale into the lungs. 2 liters at bedtime     potassium chloride SA (KLOR-CON) 20 MEQ tablet Take 3 tablets (60 mEq total) by mouth 2 (two) times daily. (Patient taking differently: Take 40 mEq by mouth 2 (two) times daily.) 360 tablet 0   pregabalin (LYRICA) 75 MG capsule TAKE 1 CAPSULE BY MOUTH TWICE DAILY 180 capsule 2   Rivaroxaban (XARELTO) 15 MG TABS tablet Take 1 tablet (15 mg total) by mouth at bedtime. 90 tablet 1   Semaglutide, 1 MG/DOSE, (OZEMPIC, 1 MG/DOSE,) 2 MG/1.5ML SOPN Inject 1 mg into the skin once a week. 6 mL 3   sertraline (ZOLOFT) 50 MG tablet Take 1 tablet (50 mg total) by mouth daily. 90 tablet 1   tamsulosin (FLOMAX) 0.4 MG CAPS capsule TAKE 2 CAPSULES BY MOUTH DAILY AFTER SUPPER 180 capsule 1   torsemide (DEMADEX) 20 MG tablet TAKE 4 TABLETS BY MOUTH DAILY 1 tablet 0   Vitamin D, Ergocalciferol, (DRISDOL) 1.25 MG (50000 UNIT) CAPS capsule Take 1 capsule (50,000 Units total) by mouth once a week. 12 capsule 1   No current facility-administered medications on file prior to visit.   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/20/2014   OLBx 01/20/14 BOOP Started steroids 02/28/14    CHF (congestive heart failure) (HCC)    Chronic anticoagulation-Xarelto 02/09/2014   Chronic atrial fibrillation ([place]) 01/03/2014   Chronic bronchitis ([place]) 12/21/2013   Chronic diastolic heart failure ([place]) 02/22/2015   Chronic pain of both knees 03/18/2017   Overview:  Added automatically from request for surgery [id number]   Chronic respiratory failure ([place])  04/10/2015   Chronic respiratory failure with hypoxia (HCC) 02/11/2020   Closed fracture of multiple carpal bones 03/07/2020   COPD (chronic obstructive pulmonary disease) ([place]) 01/25/2015   Cough    coughing up blood- started last nite, seems to be worse now   CVA (cerebral vascular accident) ([place]) 02/09/2014   Diabetic polyneuropathy associated with type 2 diabetes mellitus ([place]) 12/21/2013   Diabetic ulcer of toe of right foot associated with type 2 diabetes mellitus, with necrosis of bone ([place])    Diabetic ulcer of toe of right foot associated with type 2 diabetes mellitus, with necrosis of bone ([place])    DM (diabetes mellitus), type 2 with renal complications ([place]) 03/03/7105   Drug induced constipation 11/10/2017   Dysrhythmia    atrial fib takes cardizem,    Fibromyalgia    GERD (gastroesophageal reflux disease)    H/O hiatal hernia    Hemoptysis 08/01/2015   Herpes zoster 06/26/2015   History of stroke    Hyperkalemia-repeat pending 11/29/2014   Hyperlipemia    Hyperlipidemia    Hypertension    Hypertensive heart disease with heart failure (HCC)    Hypothyroidism    LBBB (left bundle branch block) 02/22/2015   Long term (current) use of anticoagulants 03/21/2014   Mild CAD 02/20/2015   Overview:  On recent cardiac  cath  Overview:  On recent cardiac cath   Myocardial infarction [facility])    "one dr says yes; another says no"   Myocardial infarction [facility])    "one dr says yes; another says no"   Neuromuscular disorder ([place])    rls, neuropathy   Neuropathy    rls, neuropathy    Normal coronary arteries 2011 11/29/2014   Obesity (BMI 30-39.9)    On home oxygen therapy    "2L w/CPAP at bedtime" (09/01/2018)   OSA on CPAP    cpap & oxygen   Pericardial effusion    Peripheral vascular disease (HCC)    Peritoneal effusion, chronic 02/22/2015   Overview:  With surgical window   PNA (pneumonia) 04/10/2015   Pneumonia 5/15   "several times" (01/18/2014)   Precordial pain 01/26/2015   Preoperative cardiovascular examination 02/24/2016   Primary osteoarthritis of both knees 03/18/2017   Overview:  Added automatically from request for surgery [id number]   PVD (peripheral vascular disease) ([place]) 09/28/2018   Restless leg syndrome    Sleep apnea    cpap & oxygen    Stroke ([place]) 2012   denies residual on 01/18/2014   Troponin level elevated 11/29/2014   Ulcers of both lower legs ([place]) 02/25/349   Uncomplicated opioid dependence ([place]) 12/17/2019   Uncontrolled type 2 diabetes mellitus with diabetic polyneuropathy, with long-term current use of insulin 12/21/2013   type ?    Past Surgical History:  Procedure Laterality Date   ABDOMINAL AORTOGRAM W/LOWER EXTREMITY N/A 09/08/2018   Procedure: ABDOMINAL AORTOGRAM W/LOWER EXTREMITY;  Surgeon: Marty Heck, MD;  Location: [place] [facility];  Service: Cardiovascular;  Laterality: N/A;   AMPUTATION Right 09/09/2018   Procedure: AMPUTATION RIGHT GREAT TOE;  Surgeon: Waynetta Sandy, MD;  Location: [place];  Service: Vascular;  Laterality: Right;   AMPUTATION TOE Right 05/04/2020   Procedure: AMPUTATION TOE RIGHT 2nd TOE;  Surgeon: Evelina Bucy, DPM;  Location: [facility];  Service: Podiatry;  Laterality: Right;   APPENDECTOMY     CARDIAC CATHETERIZATION  X 2 then  03/03/2012    NL LVF, normal coronaries, vessels are small ([facility]: Dr.  McGukin)   CATARACT EXTRACTION W/ INTRAOCULAR LENS  IMPLANT, BILATERAL Bilateral    CHOLECYSTECTOMY     HERNIA REPAIR     UHR   LAPAROSCOPIC GASTRIC BANDING  2010   LOWER EXTREMITY ANGIOGRAPHY Left 10/11/2018   Procedure: LOWER EXTREMITY ANGIOGRAPHY;  Surgeon: Waynetta Sandy, MD;  Location: [place] [facility];  Service: Cardiovascular;  Laterality: Left;   PERICARDIAL WINDOW Left 01/24/2014   Procedure: PERICARDIAL WINDOW;  Surgeon: Gaye Pollack, MD;  Location: [place];  Service: Thoracic;  Laterality: Left;   PERIPHERAL VASCULAR INTERVENTION Right 09/08/2018   Procedure: PERIPHERAL VASCULAR INTERVENTION;  Surgeon: Marty Heck, MD;  Location: [place] [facility];  Service: Cardiovascular;  Laterality: Right;   SINUS EXPLORATION  X 2   TEE WITHOUT CARDIOVERSION N/A 09/07/2018   Procedure: TRANSESOPHAGEAL ECHOCARDIOGRAM (TEE);  Surgeon: Acie Fredrickson Wonda Cheng, MD;  Location[facility];  Service: Cardiovascular;  Laterality: N/A;   UMBILICAL HERNIA REPAIR     VIDEO ASSISTED THORACOSCOPY Left 01/24/2014   Procedure: VIDEO ASSISTED THORACOSCOPY;  Surgeon: Gaye Pollack, MD;  Location: [place];  Service: Thoracic;  Laterality: Left;  VATS/open lung biopsy   VIDEO BRONCHOSCOPY Bilateral 12/29/2013   Procedure: VIDEO BRONCHOSCOPY WITHOUT FLUORO;  Surgeon: Elsie Stain, MD;  Location[facility];  Service: Endoscopy;  Laterality: Bilateral;   VIDEO BRONCHOSCOPY N/A 01/24/2014   Procedure: VIDEO BRONCHOSCOPY;  Surgeon: Gaye Pollack, MD;  Location: [facility];  Service: Thoracic;  Laterality: N/A;    Family History  Problem Relation Age of Onset   Cancer Mother    Stroke Father    Heart disease Father    Seizures Sister    Diabetes Sister    Anesthesia problems Neg Hx    Hypotension Neg Hx    Malignant hyperthermia Neg Hx    Pseudochol deficiency Neg Hx    Social History   Socioeconomic History   Marital status:  Widowed    Spouse name: Not on file   Number of children: 3   Years of education: Not on file   Highest education level: Not on file  Occupational History   Not on file  Tobacco Use   Smoking status: Former    Types: Cigars    Quit date: 09/01/2006    Years since quitting: 14.7   Smokeless tobacco: Never  Vaping Use   Vaping Use: Never used  Substance and Sexual Activity   Alcohol use: No    Comment: "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  HENT:  Negative for congestion, ear pain and sore throat.   Respiratory:  Positive for shortness of breath. Negative for cough.   Cardiovascular:  Negative for chest pain and leg swelling.  Gastrointestinal:  Negative for abdominal pain, constipation, diarrhea, nausea and vomiting.  Genitourinary:  Negative for dysuria and urgency.  Musculoskeletal:  Negative for arthralgias and myalgias.  Neurological:  Positive for dizziness. Negative for headaches.  Psychiatric/Behavioral:  Negative for dysphoric mood.  Objective:  BP (!) 150/70   Pulse 88   Temp (!) 97.2 F (36.2 C)   Resp 18   Ht 5' 7" (1.702 m)   Wt 244 lb (110.7 kg)   SpO2 98%   BMI 38.22 kg/m   BP/Weight 05/31/2021 8/0/1655 11/05/4825  Systolic BP 078 675 449  Diastolic BP 70 68 62  Wt. (Lbs) 244 247 266  BMI 38.22 38.69 41.66   Orthostatic VS for the past 72 hrs (Last 3 readings):  Orthostatic BP Patient Position Orthostatic Pulse  05/31/21 1003 104/74 Standing 87  05/31/21 1002 110/80 Sitting 94  05/31/21 1001  170/90 Supine 84    Physical Exam Vitals reviewed.  Constitutional:      Appearance: He is obese.     Comments: Gaunt appearance of face.  Neck:     Vascular: No carotid bruit.  Cardiovascular:     Rate and Rhythm: Normal rate. Rhythm irregular.     Pulses: Normal pulses.     Heart sounds: Normal heart sounds.  Pulmonary:     Effort: Pulmonary effort is normal.     Breath sounds: Normal breath sounds. No wheezing, rhonchi or rales.  Abdominal:     General: Bowel sounds are normal.     Palpations: Abdomen is soft.     Tenderness: There is no abdominal tenderness.  Neurological:     Mental Status: He is alert.  Psychiatric:        Mood and Affect: Mood normal.        Behavior: Behavior normal.    Diabetic Foot Exam - Simple   Simple Foot Form  05/31/2021 11:10 AM  Visual Inspection See comments: Yes Sensation Testing See comments: Yes Pulse Check Posterior Tibialis and Dorsalis pulse intact bilaterally: Yes Comments Rt foot: first and second toes amputated. No sores.  Decreased sensation of BL feet. No other sores.  Legs normally edematous. Minimal edema. Not weeping.       Lab Results  Component Value Date   WBC 7.2 05/31/2021   HGB 14.8 05/31/2021   HCT 44.5 05/31/2021   PLT 313 05/31/2021   GLUCOSE 205 (H) 05/31/2021   CHOL 209 (H) 05/31/2021   TRIG 99 05/31/2021   HDL 60 05/31/2021   LDLCALC 131 (H) 05/31/2021   ALT 16 05/31/2021   AST 18 05/31/2021   NA 140 05/31/2021   K 2.6 (L) 05/31/2021   CL 105 05/31/2021   CREATININE 1.58 (H) 05/31/2021   BUN 7 (L) 05/31/2021   CO2 19 (L) 05/31/2021   TSH 3.560 05/31/2021   INR 1.68 09/01/2018   HGBA1C 7.9 (H) 05/31/2021      Assessment & Plan:  Patient is 70 yo WM with numerous chronic medical issues, who is noncompliant, presenting for chronic follow up with dizziness and frequent falls. He is significantly orthostatic and was found to have severe hypokalemia (2.6) on stat labs. I finally reached family  Saturday morning to send to ED. At the very least he will need hydration with potassium replacement.   Problem List Items Addressed This Visit       Cardiovascular and Mediastinum   Chronic atrial fibrillation (HCC) (Chronic)    Rate controlled.  On xarelto. If continues to fall, may need to discontinue.      Relevant Medications   diltiazem (CARDIZEM CD) 120 MG 24 hr capsule   metoprolol succinate (TOPROL-XL) 50 MG 24 hr tablet   Orthostatic hypotension - Primary   Relevant Medications   diltiazem (  CARDIZEM CD) 120 MG 24 hr capsule   metoprolol succinate (TOPROL-XL) 50 MG 24 hr tablet   PVD (peripheral vascular disease) (HCC)   Relevant Medications   diltiazem (CARDIZEM CD) 120 MG 24 hr capsule   metoprolol succinate (TOPROL-XL) 50 MG 24 hr tablet     Respiratory   Chronic respiratory failure with hypoxia (HCC)    ON home oxygen at night.      COPD (chronic obstructive pulmonary disease) (HCC)    The current medical regimen is effective;  continue present plan and medications.         Digestive   GERD (gastroesophageal reflux disease)    The current medical regimen is effective;  continue present plan and medications.       Relevant Medications   pantoprazole (PROTONIX) 40 MG tablet   famotidine (PEPCID) 40 MG tablet   RESOLVED: Drug induced constipation    Resolved since discontinuing xtampza.        Endocrine   Diabetic polyneuropathy associated with type 2 diabetes mellitus (HCC)    Control: not at goal. A1C came back at 7.9. Recommend check sugars at least twice a day . Recommend make a nurse appt to come in and will help him with dexcom.  Recommend check feet daily. Recommend annual eye exams. Medicines: no changes made at his appt. Upon review of labs, I would recommend increase ozempic to 2 mg once weekly.  Continue to work on eating a healthy diet and exercise.  Labs drawn today.         Type 2 diabetes mellitus with diabetic neuropathy, with  long-term current use of insulin (HCC)   Relevant Orders   Glucose (CBG) (Completed)   Hemoglobin A1c (Completed)   CBC with Differential/Platelet (Completed)   Comprehensive metabolic panel (Completed)   Secondary hyperparathyroidism of renal origin (HCC)     Musculoskeletal and Integument   Primary osteoarthritis of both knees    Recommend tylenol as otc.       Acute osteomyelitis of toe, right (HCC)     Genitourinary   Stage 3b chronic kidney disease ([place])    Stable         Hematopoietic and Hemostatic   Acquired thrombophilia ([place])    On xarelto. May need to stop if continues to fall         Other   Class 2 severe obesity due to excess calories with serious comorbidity and body mass index (BMI) of 38.0 to 38.9 in adult [facility])    Numerous associated medical problems, diabetes, hypertension      Hyperlipemia    The current medical regimen is effective;  continue present plan and medications.       Relevant Medications   diltiazem (CARDIZEM CD) 120 MG 24 hr capsule   metoprolol succinate (TOPROL-XL) 50 MG 24 hr tablet   Other Relevant Orders   Lipid panel (Completed)   Dizziness    Pt is orthostatic. Checking cbc, cmp stat.       Relevant Orders   TSH (Completed)   Physical deconditioning    Get new mattress for his hospital bed.  Pt has had home PT previously. Last time, Home health refused to go any more due to patient's noncompliance.      Relevant Orders   Ambulatory Referral for DME   Hypokalemia    Labs came by with potassium 2.6. I am unsure how much potassium if any he is actually taking.  I tried numerous times to call  pt on Friday.  I finally reached his daughter on Saturday and he agreed to go to the ED.      Frequent falls    Recommend pt move slowly. Checking cbc, cmp stat.       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  Meds ordered this encounter  Medications   diltiazem (CARDIZEM CD) 120 MG 24 hr capsule    Sig: Take 1 capsule (120 mg total) by mouth daily.    Dispense:  90 capsule    Refill:  1   pantoprazole (PROTONIX) 40 MG tablet    Sig: Take 1 tablet (40 mg total) by mouth in the morning.    Dispense:  90 tablet    Refill:  3   famotidine (PEPCID) 40 MG tablet    Sig: Take 1 tablet (40 mg total) by mouth 2 (two) times daily.    Dispense:  180 tablet    Refill:  1   clopidogrel (PLAVIX) 75 MG tablet    Sig: Take 1 tablet (75 mg total) by mouth daily.    Dispense:  90 tablet    Refill:  1   metoprolol succinate (TOPROL-XL) 50 MG 24 hr tablet    Sig: Take 1 tablet (50 mg total) by mouth daily. Take with or immediately following a meal.    Dispense:  90 tablet    Refill:  1     Orders Placed This Encounter  Procedures   Flu Vaccine QUAD High Dose(Fluad)   Hemoglobin A1c   Lipid panel   CBC with Differential/Platelet   Comprehensive metabolic panel   TSH   Ambulatory Referral for DME   Glucose (CBG)     Follow-up: Return in about 2 weeks (around 06/14/2021) for chronic follow up.  An After Visit Summary was printed and given to the patient.  Rochel Brome, MD  Family Practice [phone]

## 2021-05-31 NOTE — Patient Instructions (Signed)
Recommend tylenol 1-2 twice a day for foot pain.

## 2021-06-01 ENCOUNTER — Encounter: Payer: Self-pay | Admitting: Family Medicine

## 2021-06-01 DIAGNOSIS — R55 Syncope and collapse: Secondary | ICD-10-CM | POA: Diagnosis not present

## 2021-06-01 DIAGNOSIS — Z87891 Personal history of nicotine dependence: Secondary | ICD-10-CM | POA: Diagnosis not present

## 2021-06-01 DIAGNOSIS — Z89619 Acquired absence of unspecified leg above knee: Secondary | ICD-10-CM | POA: Insufficient documentation

## 2021-06-01 DIAGNOSIS — M86171 Other acute osteomyelitis, right ankle and foot: Secondary | ICD-10-CM | POA: Insufficient documentation

## 2021-06-01 DIAGNOSIS — S60211A Contusion of right wrist, initial encounter: Secondary | ICD-10-CM | POA: Diagnosis not present

## 2021-06-01 DIAGNOSIS — E876 Hypokalemia: Secondary | ICD-10-CM

## 2021-06-01 DIAGNOSIS — N1832 Chronic kidney disease, stage 3b: Secondary | ICD-10-CM

## 2021-06-01 DIAGNOSIS — Z7901 Long term (current) use of anticoagulants: Secondary | ICD-10-CM | POA: Diagnosis not present

## 2021-06-01 DIAGNOSIS — E1169 Type 2 diabetes mellitus with other specified complication: Secondary | ICD-10-CM

## 2021-06-01 DIAGNOSIS — Z79891 Long term (current) use of opiate analgesic: Secondary | ICD-10-CM | POA: Diagnosis not present

## 2021-06-01 DIAGNOSIS — I5032 Chronic diastolic (congestive) heart failure: Secondary | ICD-10-CM | POA: Diagnosis not present

## 2021-06-01 DIAGNOSIS — J9610 Chronic respiratory failure, unspecified whether with hypoxia or hypercapnia: Secondary | ICD-10-CM | POA: Diagnosis not present

## 2021-06-01 DIAGNOSIS — Z7902 Long term (current) use of antithrombotics/antiplatelets: Secondary | ICD-10-CM | POA: Diagnosis not present

## 2021-06-01 DIAGNOSIS — R296 Repeated falls: Secondary | ICD-10-CM | POA: Insufficient documentation

## 2021-06-01 DIAGNOSIS — R531 Weakness: Secondary | ICD-10-CM | POA: Diagnosis not present

## 2021-06-01 DIAGNOSIS — Z9981 Dependence on supplemental oxygen: Secondary | ICD-10-CM | POA: Diagnosis not present

## 2021-06-01 DIAGNOSIS — Z79899 Other long term (current) drug therapy: Secondary | ICD-10-CM | POA: Diagnosis not present

## 2021-06-01 DIAGNOSIS — I951 Orthostatic hypotension: Secondary | ICD-10-CM | POA: Diagnosis not present

## 2021-06-01 DIAGNOSIS — J449 Chronic obstructive pulmonary disease, unspecified: Secondary | ICD-10-CM | POA: Diagnosis not present

## 2021-06-01 DIAGNOSIS — Z8673 Personal history of transient ischemic attack (TIA), and cerebral infarction without residual deficits: Secondary | ICD-10-CM | POA: Diagnosis not present

## 2021-06-01 DIAGNOSIS — I482 Chronic atrial fibrillation, unspecified: Secondary | ICD-10-CM | POA: Diagnosis not present

## 2021-06-01 DIAGNOSIS — M1990 Unspecified osteoarthritis, unspecified site: Secondary | ICD-10-CM | POA: Diagnosis not present

## 2021-06-01 DIAGNOSIS — R5381 Other malaise: Secondary | ICD-10-CM

## 2021-06-01 DIAGNOSIS — I130 Hypertensive heart and chronic kidney disease with heart failure and stage 1 through stage 4 chronic kidney disease, or unspecified chronic kidney disease: Secondary | ICD-10-CM | POA: Diagnosis not present

## 2021-06-01 DIAGNOSIS — E1122 Type 2 diabetes mellitus with diabetic chronic kidney disease: Secondary | ICD-10-CM | POA: Diagnosis not present

## 2021-06-01 DIAGNOSIS — Z9989 Dependence on other enabling machines and devices: Secondary | ICD-10-CM | POA: Diagnosis not present

## 2021-06-01 DIAGNOSIS — K219 Gastro-esophageal reflux disease without esophagitis: Secondary | ICD-10-CM | POA: Diagnosis not present

## 2021-06-01 DIAGNOSIS — R42 Dizziness and giddiness: Secondary | ICD-10-CM | POA: Insufficient documentation

## 2021-06-01 DIAGNOSIS — N183 Chronic kidney disease, stage 3 unspecified: Secondary | ICD-10-CM | POA: Diagnosis not present

## 2021-06-01 DIAGNOSIS — I1310 Hypertensive heart and chronic kidney disease without heart failure, with stage 1 through stage 4 chronic kidney disease, or unspecified chronic kidney disease: Secondary | ICD-10-CM | POA: Insufficient documentation

## 2021-06-01 DIAGNOSIS — G4733 Obstructive sleep apnea (adult) (pediatric): Secondary | ICD-10-CM | POA: Diagnosis not present

## 2021-06-01 DIAGNOSIS — I2510 Atherosclerotic heart disease of native coronary artery without angina pectoris: Secondary | ICD-10-CM | POA: Diagnosis not present

## 2021-06-01 DIAGNOSIS — J90 Pleural effusion, not elsewhere classified: Secondary | ICD-10-CM | POA: Diagnosis not present

## 2021-06-01 HISTORY — DX: Acquired absence of unspecified leg above knee: E11.69

## 2021-06-01 HISTORY — DX: Chronic kidney disease, stage 3b: N18.32

## 2021-06-01 HISTORY — DX: Hypokalemia: E87.6

## 2021-06-01 HISTORY — DX: Other malaise: R53.81

## 2021-06-01 HISTORY — DX: Repeated falls: R29.6

## 2021-06-01 LAB — LIPID PANEL
Chol/HDL Ratio: 3.5 ratio (ref 0.0–5.0)
Cholesterol, Total: 209 mg/dL — ABNORMAL HIGH (ref 100–199)
HDL: 60 mg/dL (ref >39)
LDL Chol Calc (NIH): 131 mg/dL — ABNORMAL HIGH (ref 0–99)
Triglycerides: 99 mg/dL (ref 0–149)
VLDL Cholesterol Cal: 18 mg/dL (ref 5–40)

## 2021-06-01 LAB — HEMOGLOBIN A1C
Est. average glucose Bld gHb Est-mCnc: 180 mg/dL
Hgb A1c MFr Bld: 7.9 % — ABNORMAL HIGH (ref 4.8–5.6)

## 2021-06-01 LAB — TSH: TSH: 3.56 u[IU]/mL (ref 0.450–4.500)

## 2021-06-01 NOTE — Assessment & Plan Note (Signed)
Stable

## 2021-06-01 NOTE — Assessment & Plan Note (Signed)
Get new mattress for his hospital bed.  Pt has had home PT previously. Last time, Home health refused to go any more due to patient's noncompliance.

## 2021-06-01 NOTE — Assessment & Plan Note (Signed)
Resolved since discontinuing xtampza.

## 2021-06-01 NOTE — Assessment & Plan Note (Signed)
Control: not at goal. A1C came back at 7.9. Recommend check sugars at least twice a day . Recommend make a nurse appt to come in and will help him with dexcom.  Recommend check feet daily. Recommend annual eye exams. Medicines: no changes made at his appt. Upon review of labs, I would recommend increase ozempic to 2 mg once weekly.  Continue to work on eating a healthy diet and exercise.  Labs drawn today.

## 2021-06-01 NOTE — Assessment & Plan Note (Signed)
Rate controlled.  On xarelto. If continues to fall, may need to discontinue.

## 2021-06-01 NOTE — Assessment & Plan Note (Signed)
Recommend tylenol as otc.

## 2021-06-01 NOTE — Assessment & Plan Note (Signed)
Recommend pt move slowly. Checking cbc, cmp stat.

## 2021-06-01 NOTE — Assessment & Plan Note (Signed)
The current medical regimen is effective;  continue present plan and medications.  

## 2021-06-01 NOTE — Assessment & Plan Note (Addendum)
Labs came by with potassium 2.6. I am unsure how much potassium if any he is actually taking.  I tried numerous times to call pt on Friday.  I finally reached his daughter on Saturday and he agreed to go to the ED.

## 2021-06-01 NOTE — Assessment & Plan Note (Signed)
On xarelto. May need to stop if continues to fall

## 2021-06-01 NOTE — Assessment & Plan Note (Signed)
Pt is orthostatic. Checking cbc, cmp stat.

## 2021-06-01 NOTE — Assessment & Plan Note (Signed)
Numerous associated medical problems, diabetes, hypertension

## 2021-06-01 NOTE — Assessment & Plan Note (Signed)
ON home oxygen at night.

## 2021-06-03 ENCOUNTER — Other Ambulatory Visit: Payer: Self-pay

## 2021-06-05 ENCOUNTER — Telehealth: Payer: Self-pay

## 2021-06-05 ENCOUNTER — Ambulatory Visit (INDEPENDENT_AMBULATORY_CARE_PROVIDER_SITE_OTHER): Payer: Medicare Other | Admitting: Family Medicine

## 2021-06-05 DIAGNOSIS — R5381 Other malaise: Secondary | ICD-10-CM

## 2021-06-05 NOTE — Telephone Encounter (Signed)
Kathlene Cote called to report that Jon [name] went back to bed and is refusing to come in today for his hospital follow-up.  We stressed the importance of him coming but he refused.  She reports that he has been taking fluids well but he has not been eating.  He has been taking his medications as ordered.  Today's appointment was rescheduled for next week on Friday.

## 2021-06-05 NOTE — Progress Notes (Deleted)
Subjective:  Patient ID: Jon [name], male    DOB: [birth year]  Age: 70 y.o. MRN: [id number]  Chief Complaint  Patient presents with   Hospitalization Follow-up    RH-hypokalemia    HPI   Current Outpatient Medications on File Prior to Visit  Medication Sig Dispense Refill   albuterol (PROVENTIL) (2.5 MG/3ML) 0.083% nebulizer solution Take 3 mLs (2.5 mg total) by nebulization 2 (two) times daily as needed for wheezing. 200 mL 1   atorvastatin (LIPITOR) 40 MG tablet TAKE 1 TABLET BY MOUTH DAILY 90 tablet 1   Azelastine HCl 0.15 % SOLN USE 1 SPRAY IN EACH NOSTRIL TWICE DAILY 30 mL 11   clopidogrel (PLAVIX) 75 MG tablet Take 1 tablet (75 mg total) by mouth daily. 90 tablet 1   Continuous Blood Gluc Receiver (DEXCOM G6 RECEIVER) DEVI 1 each by Does not apply route 4 (four) times daily -  before meals and at bedtime. (Patient not taking: Reported on 06/01/2021) 1 each 0   Continuous Blood Gluc Sensor (DEXCOM G6 SENSOR) MISC Apply every 10 days (Patient not taking: Reported on 06/01/2021) 3 each 3   diltiazem (CARDIZEM CD) 120 MG 24 hr capsule Take 1 capsule (120 mg total) by mouth daily. 90 capsule 1   famotidine (PEPCID) 40 MG tablet Take 1 tablet (40 mg total) by mouth 2 (two) times daily. 180 tablet 1   ferrous sulfate 325 (65 FE) MG tablet Take 325 mg by mouth daily.     Glucagon, rDNA, (GLUCAGON EMERGENCY) 1 MG KIT Inject 1 mg as directed as needed (low blood sugar).      hydrALAZINE (APRESOLINE) 100 MG tablet TAKE ONE (1) TABLET BY MOUTH 3 TIMES DAILY. DISCONTINE DILTIAZEM. (Patient taking differently: Take 100 mg by mouth 3 (three) times daily.) 90 tablet 2   hydrOXYzine (ATARAX/VISTARIL) 50 MG tablet Take 1 tablet (50 mg total) by mouth 3 (three) times daily as needed. 270 tablet 1   Insulin Pen Needle (BD PEN NEEDLE NANO 2ND GEN) 32G X 4 MM MISC USE DAILY WITH BASAGLAR 100 each 3   isosorbide mononitrate (IMDUR) 120 MG 24 hr tablet TAKE 1 TABLET BY MOUTH ONCE DAILY IN THEMORNINGS 90  tablet 1   latanoprost (XALATAN) 0.005 % ophthalmic solution SMARTSIG:In Eye(s)     metoprolol succinate (TOPROL-XL) 50 MG 24 hr tablet Take 1 tablet (50 mg total) by mouth daily. Take with or immediately following a meal. 90 tablet 1   mometasone-formoterol (DULERA) 200-5 MCG/ACT AERO Inhale 2 puffs into the lungs 2 (two) times daily as needed for wheezing.     nitroGLYCERIN (NITROSTAT) 0.4 MG SL tablet Place 0.4 mg under the tongue every 5 (five) minutes as needed for chest pain.      OXYGEN Inhale into the lungs. 2 liters at bedtime     pantoprazole (PROTONIX) 40 MG tablet Take 1 tablet (40 mg total) by mouth in the morning. 90 tablet 3   potassium chloride SA (KLOR-CON) 20 MEQ tablet Take 20 mEq by mouth daily.     pregabalin (LYRICA) 75 MG capsule TAKE 1 CAPSULE BY MOUTH TWICE DAILY 180 capsule 2   Rivaroxaban (XARELTO) 15 MG TABS tablet Take 1 tablet (15 mg total) by mouth at bedtime. 90 tablet 1   Semaglutide, 1 MG/DOSE, (OZEMPIC, 1 MG/DOSE,) 2 MG/1.5ML SOPN Inject 1 mg into the skin once a week. 6 mL 3   sertraline (ZOLOFT) 50 MG tablet Take 1 tablet (50 mg total) by mouth daily. [place]  tablet 1   tamsulosin (FLOMAX) 0.4 MG CAPS capsule TAKE 2 CAPSULES BY MOUTH DAILY AFTER SUPPER 180 capsule 1   torsemide (DEMADEX) 20 MG tablet Take 20 mg by mouth daily.     Vitamin D, Ergocalciferol, (DRISDOL) 1.25 MG (50000 UNIT) CAPS capsule Take 1 capsule (50,000 Units total) by mouth once a week. 12 capsule 1   No current facility-administered medications on file prior to visit.   Past Medical History:  Diagnosis Date   Abnormal EKG 11/29/2014   Acute renal insufficiency 11/29/2014   Acute venous embolism and thrombosis of deep vessels of distal lower extremity ([place]) 11/13/2019   Need to check venous dopplers for possible DVT due to tissure injury   Angina    Arthritis    "knees" (01/18/2014)   Asthma    Atrial fibrillation ([place])    Back pain 01/25/2015   Bariatric surgery status 11/03/2013   BOOP  (bronchiolitis obliterans with organizing pneumonia) ([place]) 01/20/2014   OLBx 01/20/14 BOOP Started steroids 02/28/14    CHF (congestive heart failure) (HCC)    Chronic anticoagulation-Xarelto 02/09/2014   Chronic atrial fibrillation (HCC) 01/03/2014   Chronic bronchitis ([place]) 12/21/2013   Chronic diastolic heart failure ([place]) 02/22/2015   Chronic pain of both knees 03/18/2017   Overview:  Added automatically from request for surgery [id number]   Chronic respiratory failure ([place]) 04/10/2015   Chronic respiratory failure with hypoxia ([place]) 02/11/2020   Closed fracture of multiple carpal bones 03/07/2020   COPD (chronic obstructive pulmonary disease) ([place]) 01/25/2015   Cough    coughing up blood- started last nite, seems to be worse now   CVA (cerebral vascular accident) ([place]) 02/09/2014   Diabetic polyneuropathy associated with type 2 diabetes mellitus ([place]) 12/21/2013   Diabetic ulcer of toe of right foot associated with type 2 diabetes mellitus, with necrosis of bone ([place])    Diabetic ulcer of toe of right foot associated with type 2 diabetes mellitus, with necrosis of bone ([place])    DM (diabetes mellitus), type 2 with renal complications ([place]) 0/03/1218   Drug induced constipation 11/10/2017   Dysrhythmia    atrial fib takes cardizem,    Fibromyalgia    GERD (gastroesophageal reflux disease)    H/O hiatal hernia    Hemoptysis 08/01/2015   Herpes zoster 06/26/2015   History of stroke    Hyperkalemia-repeat pending 11/29/2014   Hyperlipemia    Hyperlipidemia    Hypertension    Hypertensive heart disease with heart failure (HCC)    Hypothyroidism    LBBB (left bundle branch block) 02/22/2015   Long term (current) use of anticoagulants 03/21/2014   Mild CAD 02/20/2015   Overview:  On recent cardiac cath  Overview:  On recent cardiac cath   Myocardial infarction [facility])    "one dr says yes; another says no"   Myocardial infarction [facility])    "one dr says yes; another says no"   Neuromuscular disorder ([place])     rls, neuropathy   Neuropathy    rls, neuropathy    Normal coronary arteries 2011 11/29/2014   Obesity (BMI 30-39.9)    On home oxygen therapy    "2L w/CPAP at bedtime" (09/01/2018)   OSA on CPAP    cpap & oxygen   Pericardial effusion    Peripheral vascular disease (HCC)    Peritoneal effusion, chronic 02/22/2015   Overview:  With surgical window   PNA (pneumonia) 04/10/2015   Pneumonia 5/15   "several times" (01/18/2014)   Precordial  pain 01/26/2015   Preoperative cardiovascular examination 02/24/2016   Primary osteoarthritis of both knees 03/18/2017   Overview:  Added automatically from request for surgery [id number]   PVD (peripheral vascular disease) ([place]) 09/28/2018   Restless leg syndrome    Sleep apnea    cpap & oxygen    Stroke ([place]) 2012   denies residual on 01/18/2014   Troponin level elevated 11/29/2014   Ulcers of both lower legs ([place]) 5/59/7416   Uncomplicated opioid dependence ([place]) 12/17/2019   Uncontrolled type 2 diabetes mellitus with diabetic polyneuropathy, with long-term current use of insulin 12/21/2013   type ?    Past Surgical History:  Procedure Laterality Date   ABDOMINAL AORTOGRAM W/LOWER EXTREMITY N/A 09/08/2018   Procedure: ABDOMINAL AORTOGRAM W/LOWER EXTREMITY;  Surgeon: Marty Heck, MD;  Location: [facility];  Service: Cardiovascular;  Laterality: N/A;   AMPUTATION Right 09/09/2018   Procedure: AMPUTATION RIGHT GREAT TOE;  Surgeon: Waynetta Sandy, MD;  Location: [facility];  Service: Vascular;  Laterality: Right;   AMPUTATION TOE Right 05/04/2020   Procedure: AMPUTATION TOE RIGHT 2nd TOE;  Surgeon: Evelina Bucy, DPM;  Location: [facility];  Service: Podiatry;  Laterality: Right;   APPENDECTOMY     CARDIAC CATHETERIZATION  X 2 then 03/03/2012    NL LVF, normal coronaries, vessels are small ([facility]: Dr. Beatrix Fetters)   CATARACT EXTRACTION W/ INTRAOCULAR LENS  IMPLANT, BILATERAL Bilateral    CHOLECYSTECTOMY     HERNIA REPAIR     UHR   LAPAROSCOPIC  GASTRIC BANDING  2010   LOWER EXTREMITY ANGIOGRAPHY Left 10/11/2018   Procedure: LOWER EXTREMITY ANGIOGRAPHY;  Surgeon: Waynetta Sandy, MD;  Location: [place] [facility];  Service: Cardiovascular;  Laterality: Left;   PERICARDIAL WINDOW Left 01/24/2014   Procedure: PERICARDIAL WINDOW;  Surgeon: Gaye Pollack, MD;  Location: [place];  Service: Thoracic;  Laterality: Left;   PERIPHERAL VASCULAR INTERVENTION Right 09/08/2018   Procedure: PERIPHERAL VASCULAR INTERVENTION;  Surgeon: Marty Heck, MD;  Location: [place] [facility];  Service: Cardiovascular;  Laterality: Right;   SINUS EXPLORATION  X 2   TEE WITHOUT CARDIOVERSION N/A 09/07/2018   Procedure: TRANSESOPHAGEAL ECHOCARDIOGRAM (TEE);  Surgeon: Acie Fredrickson Wonda Cheng, MD;  Location: [facility];  Service: Cardiovascular;  Laterality: N/A;   UMBILICAL HERNIA REPAIR     VIDEO ASSISTED THORACOSCOPY Left 01/24/2014   Procedure: VIDEO ASSISTED THORACOSCOPY;  Surgeon: Gaye Pollack, MD;  Location: [facility];  Service: Thoracic;  Laterality: Left;  VATS/open lung biopsy   VIDEO BRONCHOSCOPY Bilateral 12/29/2013   Procedure: VIDEO BRONCHOSCOPY WITHOUT FLUORO;  Surgeon: Elsie Stain, MD;  Location: [facility];  Service: Endoscopy;  Laterality: Bilateral;   VIDEO BRONCHOSCOPY N/A 01/24/2014   Procedure: VIDEO BRONCHOSCOPY;  Surgeon: Gaye Pollack, MD;  Location: [facility];  Service: Thoracic;  Laterality: N/A;    Family History  Problem Relation Age of Onset   Cancer Mother    Stroke Father    Heart disease Father    Seizures Sister    Diabetes Sister    Anesthesia problems Neg Hx    Hypotension Neg Hx    Malignant hyperthermia Neg Hx    Pseudochol deficiency Neg Hx    Social History   Socioeconomic History   Marital status: Widowed    Spouse name: Not on file   Number of children: 3   Years of education: Not on file   Highest education level: Not on file  Occupational History  Not on file  Tobacco Use   Smoking status: Former     Types: Cigars    Quit date: 09/01/2006    Years since quitting: 14.7   Smokeless tobacco: Never  Vaping Use   Vaping Use: Never used  Substance and Sexual Activity   Alcohol use: No    Comment: "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  BP/Weight 05/31/2021 10/07/3333 12/05/6254  Systolic BP 389 373 428  Diastolic BP 70 68 62  Wt. (Lbs) 244 247 266  BMI 38.22 38.69 41.66    Physical Exam  Diabetic Foot Exam - Simple   No data filed      Lab Results  Component Value Date   WBC 7.2 05/31/2021   HGB 14.8 05/31/2021   HCT 44.5 05/31/2021   PLT 313 05/31/2021   GLUCOSE 205 (H) 05/31/2021   CHOL 209 (H) 05/31/2021   TRIG 99 05/31/2021   HDL 60 05/31/2021   LDLCALC 131 (H) 05/31/2021   ALT 16 05/31/2021   AST 18 05/31/2021   NA 140 05/31/2021   K 2.6 (L) 05/31/2021   CL 105 05/31/2021   CREATININE 1.58 (H) 05/31/2021   BUN 7 (L) 05/31/2021   CO2 19 (L) 05/31/2021   TSH 3.560 05/31/2021   INR 1.68 09/01/2018   HGBA1C 7.9 (H) 05/31/2021      Assessment & Plan:   Problem List Items Addressed This Visit   None.  No orders of the defined types were placed in this encounter.   No orders of the defined types were placed in this encounter.    Follow-up: No follow-ups on file.  An After Visit Summary was printed and given to the patient.  Rochel Brome, MD [facility] [phone]

## 2021-06-10 ENCOUNTER — Telehealth: Payer: Self-pay

## 2021-06-10 DIAGNOSIS — R0602 Shortness of breath: Secondary | ICD-10-CM | POA: Diagnosis not present

## 2021-06-10 DIAGNOSIS — N183 Chronic kidney disease, stage 3 unspecified: Secondary | ICD-10-CM | POA: Diagnosis not present

## 2021-06-10 DIAGNOSIS — E1122 Type 2 diabetes mellitus with diabetic chronic kidney disease: Secondary | ICD-10-CM | POA: Diagnosis not present

## 2021-06-10 DIAGNOSIS — Z79899 Other long term (current) drug therapy: Secondary | ICD-10-CM | POA: Diagnosis not present

## 2021-06-10 DIAGNOSIS — I252 Old myocardial infarction: Secondary | ICD-10-CM | POA: Diagnosis not present

## 2021-06-10 DIAGNOSIS — I130 Hypertensive heart and chronic kidney disease with heart failure and stage 1 through stage 4 chronic kidney disease, or unspecified chronic kidney disease: Secondary | ICD-10-CM | POA: Diagnosis not present

## 2021-06-10 DIAGNOSIS — Z87891 Personal history of nicotine dependence: Secondary | ICD-10-CM | POA: Diagnosis not present

## 2021-06-10 DIAGNOSIS — I509 Heart failure, unspecified: Secondary | ICD-10-CM | POA: Diagnosis not present

## 2021-06-10 DIAGNOSIS — Z8673 Personal history of transient ischemic attack (TIA), and cerebral infarction without residual deficits: Secondary | ICD-10-CM | POA: Diagnosis not present

## 2021-06-10 DIAGNOSIS — E785 Hyperlipidemia, unspecified: Secondary | ICD-10-CM | POA: Diagnosis not present

## 2021-06-10 DIAGNOSIS — E876 Hypokalemia: Secondary | ICD-10-CM | POA: Diagnosis not present

## 2021-06-10 DIAGNOSIS — I482 Chronic atrial fibrillation, unspecified: Secondary | ICD-10-CM | POA: Diagnosis not present

## 2021-06-10 DIAGNOSIS — Z7902 Long term (current) use of antithrombotics/antiplatelets: Secondary | ICD-10-CM | POA: Diagnosis not present

## 2021-06-10 DIAGNOSIS — I5033 Acute on chronic diastolic (congestive) heart failure: Secondary | ICD-10-CM | POA: Diagnosis not present

## 2021-06-10 DIAGNOSIS — J449 Chronic obstructive pulmonary disease, unspecified: Secondary | ICD-10-CM | POA: Diagnosis not present

## 2021-06-10 DIAGNOSIS — R112 Nausea with vomiting, unspecified: Secondary | ICD-10-CM | POA: Diagnosis not present

## 2021-06-10 DIAGNOSIS — E039 Hypothyroidism, unspecified: Secondary | ICD-10-CM | POA: Diagnosis not present

## 2021-06-10 DIAGNOSIS — M1990 Unspecified osteoarthritis, unspecified site: Secondary | ICD-10-CM | POA: Diagnosis not present

## 2021-06-10 DIAGNOSIS — Z880 Allergy status to penicillin: Secondary | ICD-10-CM | POA: Diagnosis not present

## 2021-06-10 DIAGNOSIS — Z20822 Contact with and (suspected) exposure to covid-19: Secondary | ICD-10-CM | POA: Diagnosis not present

## 2021-06-10 DIAGNOSIS — J9621 Acute and chronic respiratory failure with hypoxia: Secondary | ICD-10-CM | POA: Diagnosis not present

## 2021-06-10 DIAGNOSIS — Z885 Allergy status to narcotic agent status: Secondary | ICD-10-CM | POA: Diagnosis not present

## 2021-06-10 DIAGNOSIS — E871 Hypo-osmolality and hyponatremia: Secondary | ICD-10-CM | POA: Diagnosis not present

## 2021-06-10 DIAGNOSIS — I110 Hypertensive heart disease with heart failure: Secondary | ICD-10-CM | POA: Diagnosis not present

## 2021-06-10 DIAGNOSIS — R197 Diarrhea, unspecified: Secondary | ICD-10-CM | POA: Diagnosis not present

## 2021-06-10 NOTE — Telephone Encounter (Signed)
[name] called to report that [name] did not have a good weekend.  He has been having diarrhea and he is experiencing more swelling.  He has had no fever but he has had issues with increased shortness of breath.  His pulse oximetry this morning was 94 %.  He is having increased weakness and they are having a difficult time getting him up and down.  They were instructed to call EMS and have him transported to the ED for evaluation and treatment for his increased swelling and shortness of breath.

## 2021-06-11 ENCOUNTER — Telehealth: Payer: Self-pay

## 2021-06-11 NOTE — Chronic Care Management (AMB) (Signed)
Chronic Care Management Pharmacy Assistant   Name: Jon [name]  MRN: [id number] DOB: April 22, 1951   Reason for Encounter: Disease State call for BP    Recent office visits:  05/31/21 Rochel Brome MD. Seen for Orthostatic Hypotension. Referral to a DME. No med changes. Follow up in 2 weeks.  Recent consult visits:  None since 04/17/21  Hospital visits:  Medication Reconciliation was completed by comparing discharge summary, patient's EMR and Pharmacy list, and upon discussion with patient.  Admitted to the hospital on 06/01/21 due to weakness, hypokalemia, hypomagnesemia, pre-syncope, orthostatic hypotension. Discharge date was 06/03/21. Discharged from [facility].    Medications remain the same after Hospital Discharge    Medications: Outpatient Encounter Medications as of 06/11/2021  Medication Sig   albuterol (PROVENTIL) (2.5 MG/3ML) 0.083% nebulizer solution Take 3 mLs (2.5 mg total) by nebulization 2 (two) times daily as needed for wheezing.   atorvastatin (LIPITOR) 40 MG tablet TAKE 1 TABLET BY MOUTH DAILY   Azelastine HCl 0.15 % SOLN USE 1 SPRAY IN EACH NOSTRIL TWICE DAILY   clopidogrel (PLAVIX) 75 MG tablet Take 1 tablet (75 mg total) by mouth daily.   Continuous Blood Gluc Receiver (DEXCOM G6 RECEIVER) DEVI 1 each by Does not apply route 4 (four) times daily -  before meals and at bedtime. (Patient not taking: Reported on 06/01/2021)   Continuous Blood Gluc Sensor (DEXCOM G6 SENSOR) MISC Apply every 10 days (Patient not taking: Reported on 06/01/2021)   diltiazem (CARDIZEM CD) 120 MG 24 hr capsule Take 1 capsule (120 mg total) by mouth daily.   famotidine (PEPCID) 40 MG tablet Take 1 tablet (40 mg total) by mouth 2 (two) times daily.   ferrous sulfate 325 (65 FE) MG tablet Take 325 mg by mouth daily.   Glucagon, rDNA, (GLUCAGON EMERGENCY) 1 MG KIT Inject 1 mg as directed as needed (low blood sugar).    hydrALAZINE (APRESOLINE) 100 MG tablet TAKE ONE (1)  TABLET BY MOUTH 3 TIMES DAILY. DISCONTINE DILTIAZEM. (Patient taking differently: Take 100 mg by mouth 3 (three) times daily.)   hydrOXYzine (ATARAX/VISTARIL) 50 MG tablet Take 1 tablet (50 mg total) by mouth 3 (three) times daily as needed.   Insulin Pen Needle (BD PEN NEEDLE NANO 2ND GEN) 32G X 4 MM MISC USE DAILY WITH BASAGLAR   isosorbide mononitrate (IMDUR) 120 MG 24 hr tablet TAKE 1 TABLET BY MOUTH ONCE DAILY IN THEMORNINGS   latanoprost (XALATAN) 0.005 % ophthalmic solution SMARTSIG:In Eye(s)   metoprolol succinate (TOPROL-XL) 50 MG 24 hr tablet Take 1 tablet (50 mg total) by mouth daily. Take with or immediately following a meal.   mometasone-formoterol (DULERA) 200-5 MCG/ACT AERO Inhale 2 puffs into the lungs 2 (two) times daily as needed for wheezing.   nitroGLYCERIN (NITROSTAT) 0.4 MG SL tablet Place 0.4 mg under the tongue every 5 (five) minutes as needed for chest pain.    OXYGEN Inhale into the lungs. 2 liters at bedtime   pantoprazole (PROTONIX) 40 MG tablet Take 1 tablet (40 mg total) by mouth in the morning.   potassium chloride SA (KLOR-CON) 20 MEQ tablet Take 20 mEq by mouth daily.   pregabalin (LYRICA) 75 MG capsule TAKE 1 CAPSULE BY MOUTH TWICE DAILY   Rivaroxaban (XARELTO) 15 MG TABS tablet Take 1 tablet (15 mg total) by mouth at bedtime.   Semaglutide, 1 MG/DOSE, (OZEMPIC, 1 MG/DOSE,) 2 MG/1.5ML SOPN Inject 1 mg into the skin once a week.   sertraline (ZOLOFT)  50 MG tablet Take 1 tablet (50 mg total) by mouth daily.   tamsulosin (FLOMAX) 0.4 MG CAPS capsule TAKE 2 CAPSULES BY MOUTH DAILY AFTER SUPPER   torsemide (DEMADEX) 20 MG tablet Take 20 mg by mouth daily.   Vitamin D, Ergocalciferol, (DRISDOL) 1.25 MG (50000 UNIT) CAPS capsule Take 1 capsule (50,000 Units total) by mouth once a week.   No facility-administered encounter medications on file as of 06/11/2021.   Recent Relevant Labs: Lab Results  Component Value Date/Time   HGBA1C 7.9 (H) 05/31/2021 10:43 AM    HGBA1C 8.6 (H) 02/26/2021 10:31 AM    Kidney Function Lab Results  Component Value Date/Time   CREATININE 1.58 (H) 05/31/2021 10:42 AM   CREATININE 2.04 (H) 02/25/2021 04:15 PM   GFRNONAA 30 (L) 08/28/2020 02:10 PM   GFRAA 35 (L) 08/28/2020 02:10 PM      Recent Office Vitals: BP Readings from Last 3 Encounters:  05/31/21 (!) 150/70  04/01/21 130/68  12/03/20 124/62   Pulse Readings from Last 3 Encounters:  05/31/21 88  04/01/21 84  12/03/20 79    Wt Readings from Last 3 Encounters:  05/31/21 244 lb (110.7 kg)  04/01/21 247 lb (112 kg)  12/03/20 266 lb (120.7 kg)     Kidney Function Lab Results  Component Value Date/Time   CREATININE 1.58 (H) 05/31/2021 10:42 AM   CREATININE 2.04 (H) 02/25/2021 04:15 PM   GFRNONAA 30 (L) 08/28/2020 02:10 PM   GFRAA 35 (L) 08/28/2020 02:10 PM    BMP Latest Ref Rng & Units 05/31/2021 02/25/2021 12/27/2020  Glucose 70 - 99 mg/dL 205(H) 96 118(H)  BUN 8 - 27 mg/dL 7(L) 14 34(H)  Creatinine 0.76 - 1.27 mg/dL 1.58(H) 2.04(H) 2.39(H)  BUN/Creat Ratio 10 - 24 4(L) 7(L) 14  Sodium 134 - 144 mmol/L 140 142 142  Potassium 3.5 - 5.2 mmol/L 2.6(L) 6.2(HH) 3.6  Chloride 96 - 106 mmol/L 105 109(H) 102  CO2 20 - 29 mmol/L 19(L) 16(L) 21  Calcium 8.6 - 10.2 mg/dL 7.2(L) 9.0 9.0     Current antihypertensive regimen:  Metoprolol 50 mg daily  Diltiazem 120 mg 24 hr daily   Patient verbally confirms he is taking the above medications as directed.   How often are you checking your Blood Pressure?   he checks his blood pressure taking his medication.  Current home BP readings:   DATE:             BP               PULSE     Wrist or arm cuff: Caffeine intake: Salt intake: OTC medications including pseudoephedrine or NSAIDs?  Any readings above 180/120?  If yes any symptoms of hypertensive emergency?    What recent interventions/DTPs have been made by any provider to improve Blood Pressure control since last CPP Visit:   Any recent  hospitalizations or ED visits since last visit with CPP?   What diet changes have been made to improve Blood Pressure Control?    What exercise is being done to improve your Blood Pressure Control?    Adherence Review: Is the patient currently on ACE/ARB medication? Yes Does the patient have >5 day gap between last estimated fill dates? CPP to review  Care Gaps: Last annual wellness visit?  Star Rating Drugs:  Medication:  Last Fill: Day Supply  Atorvastatin   01/03/21  90ds Ozempic   01/14/21 [place], [place] Pharmacist Assistant  [phone]  3 attempts have been made to reach pt. All attempts were unsuccessful at completing this call.

## 2021-06-13 ENCOUNTER — Telehealth: Payer: Self-pay

## 2021-06-13 NOTE — Telephone Encounter (Signed)
  Transition Care Management Follow-up Telephone Call  Jon [name] 1951-08-11  Admit Date: 06/10/2021 Discharge Date: 06/13/2021 Diagnoses: Hyponatremia (E87.1), Acute CHF exacerbation (I50.9), Dyspnea (R06.00), Benign Prostatic Hyperplasia (N40.0), Diabetes Mellitus 2 (Q59.5), Diastolic Congestive Heart Failure(I50.30), Hypertension (I10), Obesity(E66.9), Obstructive Sleep Apnea(G47.33), Atrial Fibrilation (I48.91).    7 day post discharge: Follow up is supposed to be one week and patient has been scheduled to follow up with Dr. Tobie Poet on 06/17/2021.   Jon [name] was discharged from [facility] on 06/13/2021 with the diagnoses listed above. He was contacted today via telephone in regards to transition of care.     Discharge Instructions: Follow up with PCP in one week, and start Calcium Carbonate + Vitamin D 500mg  1 tablet once daily. Patient is also to continue a Heart healthy diet and for fluid to be restricted as well at -1200 ML per day.  Items Reviewed: Medications reviewed: yes Allergies reviewed: yes Dietary changes reviewed: yes Referrals reviewed: yes   Functional Questionnaire:  Activities of Daily Living (ADLs):   He states they are independent in the following: ambulation States they require assistance with the following: None  Any transportation issues/concerns?: no  Any patient concerns? no  Confirmed importance and date/time of follow-up visits scheduled yes Provider Appointment booked with Dr. Tobie Poet 06/17/2021.  Confirmed with patient if condition begins to worsen call PCP or go to the ER.  Patient was given the office number and encouraged to call back with question or concerns.  : yes   Elissa Lovett MA 06/13/21 3:53 PM

## 2021-06-14 ENCOUNTER — Inpatient Hospital Stay: Payer: Medicare Other | Admitting: Family Medicine

## 2021-06-16 ENCOUNTER — Encounter: Payer: Self-pay | Admitting: Family Medicine

## 2021-06-16 DIAGNOSIS — L0390 Cellulitis, unspecified: Secondary | ICD-10-CM | POA: Insufficient documentation

## 2021-06-16 DIAGNOSIS — S20319A Abrasion of unspecified front wall of thorax, initial encounter: Secondary | ICD-10-CM | POA: Insufficient documentation

## 2021-06-16 DIAGNOSIS — E871 Hypo-osmolality and hyponatremia: Secondary | ICD-10-CM | POA: Diagnosis not present

## 2021-06-16 DIAGNOSIS — R7881 Bacteremia: Secondary | ICD-10-CM | POA: Insufficient documentation

## 2021-06-16 DIAGNOSIS — N183 Chronic kidney disease, stage 3 unspecified: Secondary | ICD-10-CM | POA: Diagnosis not present

## 2021-06-16 DIAGNOSIS — R531 Weakness: Secondary | ICD-10-CM

## 2021-06-16 DIAGNOSIS — D72829 Elevated white blood cell count, unspecified: Secondary | ICD-10-CM | POA: Insufficient documentation

## 2021-06-16 DIAGNOSIS — J449 Chronic obstructive pulmonary disease, unspecified: Secondary | ICD-10-CM | POA: Diagnosis not present

## 2021-06-16 DIAGNOSIS — S31809A Unspecified open wound of unspecified buttock, initial encounter: Secondary | ICD-10-CM | POA: Insufficient documentation

## 2021-06-16 DIAGNOSIS — S62619A Displaced fracture of proximal phalanx of unspecified finger, initial encounter for closed fracture: Secondary | ICD-10-CM | POA: Insufficient documentation

## 2021-06-16 DIAGNOSIS — J9621 Acute and chronic respiratory failure with hypoxia: Secondary | ICD-10-CM | POA: Diagnosis not present

## 2021-06-16 DIAGNOSIS — E039 Hypothyroidism, unspecified: Secondary | ICD-10-CM | POA: Diagnosis not present

## 2021-06-16 DIAGNOSIS — Z7902 Long term (current) use of antithrombotics/antiplatelets: Secondary | ICD-10-CM | POA: Diagnosis not present

## 2021-06-16 DIAGNOSIS — Z8673 Personal history of transient ischemic attack (TIA), and cerebral infarction without residual deficits: Secondary | ICD-10-CM | POA: Diagnosis not present

## 2021-06-16 DIAGNOSIS — I130 Hypertensive heart and chronic kidney disease with heart failure and stage 1 through stage 4 chronic kidney disease, or unspecified chronic kidney disease: Secondary | ICD-10-CM | POA: Diagnosis not present

## 2021-06-16 DIAGNOSIS — I4729 Other ventricular tachycardia: Secondary | ICD-10-CM | POA: Insufficient documentation

## 2021-06-16 DIAGNOSIS — I482 Chronic atrial fibrillation, unspecified: Secondary | ICD-10-CM | POA: Diagnosis not present

## 2021-06-16 DIAGNOSIS — M6281 Muscle weakness (generalized): Secondary | ICD-10-CM | POA: Insufficient documentation

## 2021-06-16 DIAGNOSIS — I5033 Acute on chronic diastolic (congestive) heart failure: Secondary | ICD-10-CM | POA: Diagnosis not present

## 2021-06-16 DIAGNOSIS — N400 Enlarged prostate without lower urinary tract symptoms: Secondary | ICD-10-CM | POA: Insufficient documentation

## 2021-06-16 DIAGNOSIS — M1990 Unspecified osteoarthritis, unspecified site: Secondary | ICD-10-CM | POA: Diagnosis not present

## 2021-06-16 DIAGNOSIS — J690 Pneumonitis due to inhalation of food and vomit: Secondary | ICD-10-CM | POA: Insufficient documentation

## 2021-06-16 DIAGNOSIS — E1122 Type 2 diabetes mellitus with diabetic chronic kidney disease: Secondary | ICD-10-CM | POA: Diagnosis not present

## 2021-06-16 DIAGNOSIS — S0083XA Contusion of other part of head, initial encounter: Secondary | ICD-10-CM | POA: Insufficient documentation

## 2021-06-16 DIAGNOSIS — I87319 Chronic venous hypertension (idiopathic) with ulcer of unspecified lower extremity: Secondary | ICD-10-CM | POA: Insufficient documentation

## 2021-06-16 DIAGNOSIS — R300 Dysuria: Secondary | ICD-10-CM | POA: Insufficient documentation

## 2021-06-16 DIAGNOSIS — W19XXXA Unspecified fall, initial encounter: Secondary | ICD-10-CM | POA: Insufficient documentation

## 2021-06-16 DIAGNOSIS — Z9981 Dependence on supplemental oxygen: Secondary | ICD-10-CM | POA: Diagnosis not present

## 2021-06-16 DIAGNOSIS — I249 Acute ischemic heart disease, unspecified: Secondary | ICD-10-CM | POA: Insufficient documentation

## 2021-06-16 DIAGNOSIS — Z7901 Long term (current) use of anticoagulants: Secondary | ICD-10-CM | POA: Diagnosis not present

## 2021-06-16 DIAGNOSIS — S62113A Displaced fracture of triquetrum [cuneiform] bone, unspecified wrist, initial encounter for closed fracture: Secondary | ICD-10-CM | POA: Insufficient documentation

## 2021-06-16 DIAGNOSIS — G4733 Obstructive sleep apnea (adult) (pediatric): Secondary | ICD-10-CM | POA: Diagnosis not present

## 2021-06-16 DIAGNOSIS — I96 Gangrene, not elsewhere classified: Secondary | ICD-10-CM | POA: Insufficient documentation

## 2021-06-16 DIAGNOSIS — J189 Pneumonia, unspecified organism: Secondary | ICD-10-CM | POA: Insufficient documentation

## 2021-06-16 DIAGNOSIS — I252 Old myocardial infarction: Secondary | ICD-10-CM | POA: Diagnosis not present

## 2021-06-16 DIAGNOSIS — A419 Sepsis, unspecified organism: Secondary | ICD-10-CM | POA: Insufficient documentation

## 2021-06-16 DIAGNOSIS — K219 Gastro-esophageal reflux disease without esophagitis: Secondary | ICD-10-CM | POA: Diagnosis not present

## 2021-06-16 DIAGNOSIS — S91101A Unspecified open wound of right great toe without damage to nail, initial encounter: Secondary | ICD-10-CM | POA: Insufficient documentation

## 2021-06-16 DIAGNOSIS — S8001XA Contusion of right knee, initial encounter: Secondary | ICD-10-CM | POA: Insufficient documentation

## 2021-06-16 DIAGNOSIS — L03115 Cellulitis of right lower limb: Secondary | ICD-10-CM | POA: Insufficient documentation

## 2021-06-16 DIAGNOSIS — E875 Hyperkalemia: Secondary | ICD-10-CM | POA: Insufficient documentation

## 2021-06-16 HISTORY — DX: Benign prostatic hyperplasia without lower urinary tract symptoms: N40.0

## 2021-06-16 HISTORY — DX: Sepsis, unspecified organism: A41.9

## 2021-06-16 HISTORY — DX: Acute ischemic heart disease, unspecified: I24.9

## 2021-06-16 HISTORY — DX: Non-pressure chronic ulcer of unspecified part of unspecified lower leg with unspecified severity: I87.319

## 2021-06-16 HISTORY — DX: Muscle weakness (generalized): M62.81

## 2021-06-16 HISTORY — DX: Unspecified open wound of unspecified buttock, initial encounter: S31.809A

## 2021-06-16 HISTORY — DX: Other ventricular tachycardia: I47.29

## 2021-06-16 HISTORY — DX: Hypomagnesemia: E83.42

## 2021-06-16 HISTORY — DX: Weakness: R53.1

## 2021-06-16 NOTE — Progress Notes (Deleted)
No show

## 2021-06-17 ENCOUNTER — Other Ambulatory Visit: Payer: Self-pay

## 2021-06-17 ENCOUNTER — Ambulatory Visit (INDEPENDENT_AMBULATORY_CARE_PROVIDER_SITE_OTHER): Payer: Medicare Other | Admitting: Family Medicine

## 2021-06-17 ENCOUNTER — Encounter: Payer: Self-pay | Admitting: Family Medicine

## 2021-06-17 VITALS — BP 130/64 | HR 78 | Temp 97.9°F | Resp 18 | Ht 67.0 in | Wt 231.0 lb

## 2021-06-17 DIAGNOSIS — R0609 Other forms of dyspnea: Secondary | ICD-10-CM | POA: Diagnosis not present

## 2021-06-17 DIAGNOSIS — I5032 Chronic diastolic (congestive) heart failure: Secondary | ICD-10-CM

## 2021-06-17 DIAGNOSIS — Z9981 Dependence on supplemental oxygen: Secondary | ICD-10-CM | POA: Diagnosis not present

## 2021-06-17 DIAGNOSIS — E1142 Type 2 diabetes mellitus with diabetic polyneuropathy: Secondary | ICD-10-CM | POA: Diagnosis not present

## 2021-06-17 DIAGNOSIS — R5381 Other malaise: Secondary | ICD-10-CM

## 2021-06-17 DIAGNOSIS — E559 Vitamin D deficiency, unspecified: Secondary | ICD-10-CM | POA: Diagnosis not present

## 2021-06-17 DIAGNOSIS — I110 Hypertensive heart disease with heart failure: Secondary | ICD-10-CM | POA: Diagnosis not present

## 2021-06-17 DIAGNOSIS — R338 Other retention of urine: Secondary | ICD-10-CM

## 2021-06-17 DIAGNOSIS — R197 Diarrhea, unspecified: Secondary | ICD-10-CM

## 2021-06-17 DIAGNOSIS — E876 Hypokalemia: Secondary | ICD-10-CM

## 2021-06-17 DIAGNOSIS — N401 Enlarged prostate with lower urinary tract symptoms: Secondary | ICD-10-CM

## 2021-06-17 DIAGNOSIS — M1A4410 Other secondary chronic gout, right hand, without tophus (tophi): Secondary | ICD-10-CM | POA: Diagnosis not present

## 2021-06-17 DIAGNOSIS — I4811 Longstanding persistent atrial fibrillation: Secondary | ICD-10-CM

## 2021-06-17 DIAGNOSIS — I951 Orthostatic hypotension: Secondary | ICD-10-CM

## 2021-06-17 DIAGNOSIS — J9611 Chronic respiratory failure with hypoxia: Secondary | ICD-10-CM

## 2021-06-17 DIAGNOSIS — I5022 Chronic systolic (congestive) heart failure: Secondary | ICD-10-CM

## 2021-06-17 NOTE — Progress Notes (Signed)
Subjective:  Patient ID: Jon [name], male    DOB: 1950/10/06  Age: 70 y.o. MRN: [id number]  Chief Complaint  Patient presents with   Hospitalization Follow-up   HPI Patient is a 70 year old white male with numerous chronic medical issues including diabetes, COPD, atrial fibrillation, chronic kidney disease, anemia, hypertension, congestive heart failure (diastolic) and obstructive sleep apnea.  Patient has had 2 hospitalizations since October 1.  Hospitalization #1 once for severe hypokalemia.  He has been experience severe weakness and was having near syncopal episodes and falls.  The patient was seen by me and when his potassium came back still low I sent him to the hospital.  Patient was also found to be orthostatic which resolved with IV fluids and IV albumin while his home diuretics were held.  Patient also had hypomagnesemia.  These were repleted.  Patient had a borderline positive troponin which was completed to be a demand ischemia issue.  His initial blood pressure on admission was over 200 but improved significantly during admission to systolic blood pressure [phone].  Patient was discharged on June 03, 2021. Hospitalization #2: June 10, 2021 through June 13, 2021. Patient returned with congestive heart failure.  Patient was diuresed during his admission.  He was discharged home in improved condition and was scheduled to follow-up with [place].  In addition he was ordered physical therapy for gait and balance training as well as fall risk assessment and Occupational Therapy to help regain independence with his ADLs.  His oxygen was also discharged on home oxygen at 2 L continuous. His pro BNP was 35000 on admission.  Current Outpatient Medications on File Prior to Visit  Medication Sig Dispense Refill   atorvastatin (LIPITOR) 40 MG tablet TAKE 1 TABLET BY MOUTH DAILY 90 tablet 1   Azelastine HCl 0.15 % SOLN USE 1 SPRAY IN EACH NOSTRIL TWICE DAILY 30 mL 11   clopidogrel (PLAVIX)  75 MG tablet Take 75 mg by mouth daily.     Continuous Blood Gluc Receiver (DEXCOM G6 RECEIVER) DEVI 1 each by Does not apply route 4 (four) times daily -  before meals and at bedtime. 1 each 0   Continuous Blood Gluc Sensor (DEXCOM G6 SENSOR) MISC Apply every 10 days 3 each 3   diltiazem (CARDIZEM CD) 120 MG 24 hr capsule Take 1 capsule (120 mg total) by mouth daily. 90 capsule 1   famotidine (PEPCID) 20 MG tablet Take 40 mg by mouth in the morning.     ferrous sulfate 325 (65 FE) MG tablet Take 325 mg by mouth daily.     hydrALAZINE (APRESOLINE) 100 MG tablet Take 100 mg by mouth in the morning, at noon, and at bedtime.     hydrOXYzine (ATARAX/VISTARIL) 50 MG tablet Take 1 tablet (50 mg total) by mouth 3 (three) times daily as needed. 270 tablet 1   isosorbide mononitrate (IMDUR) 120 MG 24 hr tablet TAKE 1 TABLET BY MOUTH ONCE DAILY IN THEMORNINGS 90 tablet 1   latanoprost (XALATAN) 0.005 % ophthalmic solution SMARTSIG:In Eye(s)     mometasone-formoterol (DULERA) 200-5 MCG/ACT AERO Inhale 2 puffs into the lungs 2 (two) times daily as needed for wheezing.     nitroGLYCERIN (NITROSTAT) 0.4 MG SL tablet Place 0.4 mg under the tongue every 5 (five) minutes as needed for chest pain.      OXYGEN Inhale into the lungs. 3 L/min.     pantoprazole (PROTONIX) 40 MG tablet Take 1 tablet (40 mg total) by mouth in  the morning. 90 tablet 3   potassium chloride SA (KLOR-CON) 20 MEQ tablet Take 20 mEq by mouth daily.     Rivaroxaban (XARELTO) 15 MG TABS tablet Take 1 tablet (15 mg total) by mouth at bedtime. 90 tablet 1   torsemide (DEMADEX) 20 MG tablet Take 20 mg by mouth daily.     allopurinol (ZYLOPRIM) 100 MG tablet Take 100 mg by mouth daily.     Glucagon, rDNA, (GLUCAGON EMERGENCY) 1 MG KIT Inject 1 mg as directed as needed (low blood sugar).      Insulin Pen Needle (BD PEN NEEDLE NANO 2ND GEN) 32G X 4 MM MISC USE DAILY WITH BASAGLAR 100 each 3   No current facility-administered medications on file  prior to visit.   Past Medical History:  Diagnosis Date   Abnormal EKG 11/29/2014   Acute renal insufficiency 11/29/2014   Acute venous embolism and thrombosis of deep vessels of distal lower extremity ([place]) 11/13/2019   Need to check venous dopplers for possible DVT due to tissure injury   Angina    Arthritis    "knees" (01/18/2014)   Asthma    Atrial fibrillation ([place])    Back pain 01/25/2015   Bariatric surgery status 11/03/2013   BOOP (bronchiolitis obliterans with organizing pneumonia) ([place]) 01/20/2014   OLBx 01/20/14 BOOP Started steroids 02/28/14    CHF (congestive heart failure) (HCC)    Chronic anticoagulation-Xarelto 02/09/2014   Chronic atrial fibrillation (HCC) 01/03/2014   Chronic bronchitis ([place]) 12/21/2013   Chronic diastolic heart failure ([place]) 02/22/2015   Chronic pain of both knees 03/18/2017   Overview:  Added automatically from request for surgery [id number]   Chronic respiratory failure ([place]) 04/10/2015   Chronic respiratory failure with hypoxia ([place]) 02/11/2020   Closed fracture of multiple carpal bones 03/07/2020   COPD (chronic obstructive pulmonary disease) ([place]) 01/25/2015   Cough    coughing up blood- started last nite, seems to be worse now   CVA (cerebral vascular accident) ([place]) 02/09/2014   Diabetic polyneuropathy associated with type 2 diabetes mellitus ([place]) 12/21/2013   Diabetic ulcer of toe of right foot associated with type 2 diabetes mellitus, with necrosis of bone ([place])    Diabetic ulcer of toe of right foot associated with type 2 diabetes mellitus, with necrosis of bone ([place])    DM (diabetes mellitus), type 2 with renal complications ([place]) 12/08/163   Drug induced constipation 11/10/2017   Dysrhythmia    atrial fib takes cardizem,    Fibromyalgia    GERD (gastroesophageal reflux disease)    H/O hiatal hernia    Hemoptysis 08/01/2015   Herpes zoster 06/26/2015   History of stroke    Hyperkalemia-repeat pending 11/29/2014   Hyperlipemia    Hyperlipidemia     Hypertension    Hypertensive heart disease with heart failure (HCC)    Hypothyroidism    LBBB (left bundle branch block) 02/22/2015   Long term (current) use of anticoagulants 03/21/2014   Mild CAD 02/20/2015   Overview:  On recent cardiac cath  Overview:  On recent cardiac cath   Myocardial infarction [facility])    "one dr says yes; another says no"   Myocardial infarction [facility])    "one dr says yes; another says no"   Neuromuscular disorder ([place])    rls, neuropathy   Neuropathy    rls, neuropathy    Normal coronary arteries 2011 11/29/2014   Obesity (BMI 30-39.9)    On home oxygen therapy    "2L w/CPAP  at bedtime" (09/01/2018)   OSA on CPAP    cpap & oxygen   Pericardial effusion    Peripheral vascular disease (HCC)    Peritoneal effusion, chronic 02/22/2015   Overview:  With surgical window   PNA (pneumonia) 04/10/2015   Pneumonia 5/15   "several times" (01/18/2014)   Precordial pain 01/26/2015   Preoperative cardiovascular examination 02/24/2016   Primary osteoarthritis of both knees 03/18/2017   Overview:  Added automatically from request for surgery [id number]   PVD (peripheral vascular disease) ([place]) 09/28/2018   Restless leg syndrome    Sepsis ([place]) 06/16/2021   Sleep apnea    cpap & oxygen    Stroke ([place]) 2012   denies residual on 01/18/2014   Troponin level elevated 11/29/2014   Ulcers of both lower legs ([place]) 6/96/7893   Uncomplicated opioid dependence ([place]) 12/17/2019   Uncontrolled type 2 diabetes mellitus with diabetic polyneuropathy, with long-term current use of insulin 12/21/2013   type ?    Wound of buttock 06/16/2021   Past Surgical History:  Procedure Laterality Date   ABDOMINAL AORTOGRAM W/LOWER EXTREMITY N/A 09/08/2018   Procedure: ABDOMINAL AORTOGRAM W/LOWER EXTREMITY;  Surgeon: Marty Heck, MD;  Location: [place] [facility];  Service: Cardiovascular;  Laterality: N/A;   AMPUTATION Right 09/09/2018   Procedure: AMPUTATION RIGHT GREAT TOE;  Surgeon: Waynetta Sandy, MD;  Location: [place];  Service: Vascular;  Laterality: Right;   AMPUTATION TOE Right 05/04/2020   Procedure: AMPUTATION TOE RIGHT 2nd TOE;  Surgeon: Evelina Bucy, DPM;  Location: [facility];  Service: Podiatry;  Laterality: Right;   APPENDECTOMY     CARDIAC CATHETERIZATION  X 2 then 03/03/2012    NL LVF, normal coronaries, vessels are small ([facility]: Dr. Beatrix Fetters)   CATARACT EXTRACTION W/ INTRAOCULAR LENS  IMPLANT, BILATERAL Bilateral    CHOLECYSTECTOMY     HERNIA REPAIR     UHR   LAPAROSCOPIC GASTRIC BANDING  2010   LOWER EXTREMITY ANGIOGRAPHY Left 10/11/2018   Procedure: LOWER EXTREMITY ANGIOGRAPHY;  Surgeon: Waynetta Sandy, MD;  Location: [place] [facility];  Service: Cardiovascular;  Laterality: Left;   PERICARDIAL WINDOW Left 01/24/2014   Procedure: PERICARDIAL WINDOW;  Surgeon: Gaye Pollack, MD;  Location: [place];  Service: Thoracic;  Laterality: Left;   PERIPHERAL VASCULAR INTERVENTION Right 09/08/2018   Procedure: PERIPHERAL VASCULAR INTERVENTION;  Surgeon: Marty Heck, MD;  Location: [place] [facility];  Service: Cardiovascular;  Laterality: Right;   SINUS EXPLORATION  X 2   TEE WITHOUT CARDIOVERSION N/A 09/07/2018   Procedure: TRANSESOPHAGEAL ECHOCARDIOGRAM (TEE);  Surgeon: Acie Fredrickson Wonda Cheng, MD;  Location: [facility] ENDOSCOPY;  Service: Cardiovascular;  Laterality: N/A;   UMBILICAL HERNIA REPAIR     VIDEO ASSISTED THORACOSCOPY Left 01/24/2014   Procedure: VIDEO ASSISTED THORACOSCOPY;  Surgeon: Gaye Pollack, MD;  Location: [place];  Service: Thoracic;  Laterality: Left;  VATS/open lung biopsy   VIDEO BRONCHOSCOPY Bilateral 12/29/2013   Procedure: VIDEO BRONCHOSCOPY WITHOUT FLUORO;  Surgeon: Elsie Stain, MD;  [facility];  Service: Endoscopy;  Laterality: Bilateral;   VIDEO BRONCHOSCOPY N/A 01/24/2014   Procedure: VIDEO BRONCHOSCOPY;  Surgeon: Gaye Pollack, MD;  Location: [facility];  Service: Thoracic;  Laterality: N/A;    Family History  Problem Relation  Age of Onset   Cancer Mother    Stroke Father    Heart disease Father    Seizures Sister    Diabetes Sister    Anesthesia problems Neg Hx  Hypotension Neg Hx    Malignant hyperthermia Neg Hx    Pseudochol deficiency Neg Hx    Social History   Socioeconomic History   Marital status: Widowed    Spouse name: Not on file   Number of children: 3   Years of education: Not on file   Highest education level: Not on file  Occupational History   Not on file  Tobacco Use   Smoking status: Former    Types: Cigars    Quit date: 09/01/2006    Years since quitting: 14.8   Smokeless tobacco: Never  Vaping Use   Vaping Use: Never used  Substance and Sexual Activity   Alcohol use: No    Comment: "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 Negative for chills and fever.  HENT:  Negative for congestion and sore throat.   Respiratory:  Positive for cough and shortness of breath.        No improvement since left the hospital.  Cardiovascular:  Positive for chest pain (while in the hospital (during discharge.) Comes and goes. Walks to bathroom. pt has DOE and CP with 15 ft walking. resolves with rest. Has several times per day).  Gastrointestinal:  Positive for diarrhea (4 loose stools per day.). Negative for abdominal pain, constipation and nausea.  Endocrine: Positive for polydipsia. Negative for polyphagia (poor appetite.) and polyuria.  Genitourinary:  Negative for dysuria.  Musculoskeletal:  Negative for arthralgias and back pain.  Neurological:  Positive for weakness.  Negative for dizziness and headaches.  Psychiatric/Behavioral:  Negative for dysphoric mood. The patient is not nervous/anxious.     Objective:  BP 130/64   Pulse 78   Temp 97.9 F (36.6 C)   Resp 18   Ht '5\' 7"'  (1.702 m)   Wt 231 lb (104.8 kg)   BMI 36.18 kg/m   BP/Weight 06/17/2021 03/07/2375 10/09/3149  Systolic BP 761 607 371  Diastolic BP 64 70 68  Wt. (Lbs) 231 244 247  BMI 36.18 38.22 38.69    Physical Exam Vitals reviewed.  Constitutional:      Comments: Tired appearing. Face appears gaunt.  Neck:     Vascular: No carotid bruit.  Cardiovascular:     Rate and Rhythm: Normal rate. Rhythm irregular.     Heart sounds: Normal heart sounds.  Pulmonary:     Effort: Pulmonary effort is normal.     Breath sounds: Normal breath sounds. No wheezing, rhonchi or rales.  Abdominal:     General: Bowel sounds are normal.     Palpations: Abdomen is soft.     Tenderness: There is no abdominal tenderness.  Neurological:     Mental Status: He is alert and oriented to person, place, and time.  Psychiatric:        Mood and Affect: Mood normal.        Behavior: Behavior normal.    Diabetic Foot Exam - Simple   No data filed      Lab Results  Component Value Date  WBC 7.6 06/17/2021   HGB 13.3 06/17/2021   HCT 40.6 06/17/2021   PLT 320 06/17/2021   GLUCOSE 158 (H) 06/17/2021   CHOL 209 (H) 05/31/2021   TRIG 99 05/31/2021   HDL 60 05/31/2021   LDLCALC 131 (H) 05/31/2021   ALT 13 06/17/2021   AST 14 06/17/2021   NA 141 06/17/2021   K 3.7 06/17/2021   CL 101 06/17/2021   CREATININE 1.87 (H) 06/17/2021   BUN 11 06/17/2021   CO2 29 06/17/2021   TSH 3.560 05/31/2021   INR 1.68 09/01/2018   HGBA1C 7.9 (H) 05/31/2021      Assessment & Plan:   Problem List Items Addressed This Visit       Cardiovascular and Mediastinum   Atrial fibrillation ([place])    Continue xarelto.      Relevant Medications   metoprolol succinate (TOPROL-XL) 25 MG 24 hr tablet    hydrALAZINE (APRESOLINE) 100 MG tablet   CHF (congestive heart failure) ([place]) - Primary    Check probnp.  Continue torsemide 20 mg once daily.      Relevant Medications   metoprolol succinate (TOPROL-XL) 25 MG 24 hr tablet   hydrALAZINE (APRESOLINE) 100 MG tablet   Other Relevant Orders   CBC with Differential/Platelet (Completed)   Comprehensive metabolic panel (Completed)   RESOLVED: Hypertensive heart disease with stage 3b chronic kidney disease (HCC)    Orthostatic hypotension resolved.  Recommended she move slowly. Change positions slowly. Continue imdur 120 mg once daily, hydralazine 100 mg one tid, cardizem cd 120 mg once daily. Has NTG.      Relevant Medications   metoprolol succinate (TOPROL-XL) 25 MG 24 hr tablet   hydrALAZINE (APRESOLINE) 100 MG tablet     Respiratory   Chronic respiratory failure with hypoxia (HCC)    Continue oxygen 2 L continuous.        Endocrine   Diabetic polyneuropathy associated with type 2 diabetes mellitus (HCC)    Review medications. List is not matching history.      Relevant Medications   vortioxetine HBr (TRINTELLIX) 10 MG TABS tablet     Genitourinary   BPH (benign prostatic hyperplasia)     Other   Vitamin D deficiency    Restart vitamin D 50000 weekly.      Relevant Orders   VITAMIN D 25 Hydroxy (Vit-D Deficiency, Fractures) (Completed)   On home oxygen therapy    ON O2 2 Liters continueous.      Hypokalemia    Potassium replaced. Potassium Chloride 20 meq once daily.      DOE (dyspnea on exertion)    Recommend continue Dulera 2 puffs twice a day. Albuterol hfa 2 puffs qid prn sob.      Relevant Orders   Pro b natriuretic peptide (BNP) (Completed)   Gout    Recommend restart allopurinol 100 mg once daily.       Relevant Orders   Uric acid (Completed)   Diarrhea    Diarrhea has improved.       Relevant Orders   Cdiff NAA+O+P+Stool Culture   Physical deconditioning  .  Meds ordered this encounter   Medications   metoprolol succinate (TOPROL-XL) 25 MG 24 hr tablet    Sig: Take 0.5 tablets (12.5 mg total) by mouth daily. Take with or immediately following a meal.    Dispense:  15 tablet    Refill:  2   Vitamin D, Ergocalciferol, (DRISDOL) 1.25 MG (50000 UNIT) CAPS capsule  Sig: Take 1 capsule (50,000 Units total) by mouth once a week.    Dispense:  12 capsule    Refill:  1   albuterol (VENTOLIN HFA) 108 (90 Base) MCG/ACT inhaler    Sig: Inhale 2 puffs into the lungs every 6 (six) hours as needed for wheezing or shortness of breath.    Dispense:  8 g    Refill:  2   vortioxetine HBr (TRINTELLIX) 10 MG TABS tablet    Sig: Take 1 tablet (10 mg total) by mouth daily.    Dispense:  30 tablet    Refill:  2    Orders Placed This Encounter  Procedures   Cdiff NAA+O+P+Stool Culture   CBC with Differential/Platelet   Comprehensive metabolic panel   VITAMIN D 25 Hydroxy (Vit-D Deficiency, Fractures)   Pro b natriuretic peptide (BNP)   Uric acid     Follow-up: Return in about 6 weeks (around 07/29/2021) for chronic follow up.  An After Visit Summary was printed and given to the patient.  Rochel Brome, MD [facility] [phone]

## 2021-06-18 ENCOUNTER — Telehealth: Payer: Self-pay

## 2021-06-18 DIAGNOSIS — K219 Gastro-esophageal reflux disease without esophagitis: Secondary | ICD-10-CM | POA: Diagnosis not present

## 2021-06-18 DIAGNOSIS — Z7902 Long term (current) use of antithrombotics/antiplatelets: Secondary | ICD-10-CM | POA: Diagnosis not present

## 2021-06-18 DIAGNOSIS — M1990 Unspecified osteoarthritis, unspecified site: Secondary | ICD-10-CM | POA: Diagnosis not present

## 2021-06-18 DIAGNOSIS — I482 Chronic atrial fibrillation, unspecified: Secondary | ICD-10-CM | POA: Diagnosis not present

## 2021-06-18 DIAGNOSIS — E871 Hypo-osmolality and hyponatremia: Secondary | ICD-10-CM | POA: Diagnosis not present

## 2021-06-18 DIAGNOSIS — Z9981 Dependence on supplemental oxygen: Secondary | ICD-10-CM | POA: Diagnosis not present

## 2021-06-18 DIAGNOSIS — I130 Hypertensive heart and chronic kidney disease with heart failure and stage 1 through stage 4 chronic kidney disease, or unspecified chronic kidney disease: Secondary | ICD-10-CM | POA: Diagnosis not present

## 2021-06-18 DIAGNOSIS — J9621 Acute and chronic respiratory failure with hypoxia: Secondary | ICD-10-CM | POA: Diagnosis not present

## 2021-06-18 DIAGNOSIS — G4733 Obstructive sleep apnea (adult) (pediatric): Secondary | ICD-10-CM | POA: Diagnosis not present

## 2021-06-18 DIAGNOSIS — E039 Hypothyroidism, unspecified: Secondary | ICD-10-CM | POA: Diagnosis not present

## 2021-06-18 DIAGNOSIS — E1122 Type 2 diabetes mellitus with diabetic chronic kidney disease: Secondary | ICD-10-CM | POA: Diagnosis not present

## 2021-06-18 DIAGNOSIS — Z7901 Long term (current) use of anticoagulants: Secondary | ICD-10-CM | POA: Diagnosis not present

## 2021-06-18 DIAGNOSIS — I252 Old myocardial infarction: Secondary | ICD-10-CM | POA: Diagnosis not present

## 2021-06-18 DIAGNOSIS — Z8673 Personal history of transient ischemic attack (TIA), and cerebral infarction without residual deficits: Secondary | ICD-10-CM | POA: Diagnosis not present

## 2021-06-18 DIAGNOSIS — I5033 Acute on chronic diastolic (congestive) heart failure: Secondary | ICD-10-CM | POA: Diagnosis not present

## 2021-06-18 DIAGNOSIS — J449 Chronic obstructive pulmonary disease, unspecified: Secondary | ICD-10-CM | POA: Diagnosis not present

## 2021-06-18 DIAGNOSIS — N183 Chronic kidney disease, stage 3 unspecified: Secondary | ICD-10-CM | POA: Diagnosis not present

## 2021-06-18 LAB — COMPREHENSIVE METABOLIC PANEL
ALT: 13 IU/L (ref 0–44)
AST: 14 IU/L (ref 0–40)
Albumin/Globulin Ratio: 1.2 (ref 1.2–2.2)
Albumin: 2.6 g/dL — ABNORMAL LOW (ref 3.7–4.7)
Alkaline Phosphatase: 131 IU/L — ABNORMAL HIGH (ref 44–121)
BUN/Creatinine Ratio: 6 — ABNORMAL LOW (ref 10–24)
BUN: 11 mg/dL (ref 8–27)
Bilirubin Total: 0.7 mg/dL (ref 0.0–1.2)
CO2: 29 mmol/L (ref 20–29)
Calcium: 7.4 mg/dL — ABNORMAL LOW (ref 8.6–10.2)
Chloride: 101 mmol/L (ref 96–106)
Creatinine, Ser: 1.87 mg/dL — ABNORMAL HIGH (ref 0.76–1.27)
Globulin, Total: 2.1 g/dL (ref 1.5–4.5)
Glucose: 158 mg/dL — ABNORMAL HIGH (ref 70–99)
Potassium: 3.7 mmol/L (ref 3.5–5.2)
Sodium: 141 mmol/L (ref 134–144)
Total Protein: 4.7 g/dL — ABNORMAL LOW (ref 6.0–8.5)
eGFR: 38 mL/min/{1.73_m2} — ABNORMAL LOW (ref >59)

## 2021-06-18 LAB — CBC WITH DIFFERENTIAL/PLATELET
Basophils Absolute: 0.1 10*3/uL (ref 0.0–0.2)
Basos: 1 %
EOS (ABSOLUTE): 0 10*3/uL (ref 0.0–0.4)
Eos: 0 %
Hematocrit: 40.6 % (ref 37.5–51.0)
Hemoglobin: 13.3 g/dL (ref 13.0–17.7)
Immature Grans (Abs): 0 10*3/uL (ref 0.0–0.1)
Immature Granulocytes: 0 %
Lymphocytes Absolute: 1.5 10*3/uL (ref 0.7–3.1)
Lymphs: 19 %
MCH: 28.1 pg (ref 26.6–33.0)
MCHC: 32.8 g/dL (ref 31.5–35.7)
MCV: 86 fL (ref 79–97)
Monocytes Absolute: 0.6 10*3/uL (ref 0.1–0.9)
Monocytes: 8 %
Neutrophils Absolute: 5.5 10*3/uL (ref 1.4–7.0)
Neutrophils: 72 %
Platelets: 320 10*3/uL (ref 150–450)
RBC: 4.74 x10E6/uL (ref 4.14–5.80)
RDW: 14.1 % (ref 11.6–15.4)
WBC: 7.6 10*3/uL (ref 3.4–10.8)

## 2021-06-18 LAB — PRO B NATRIURETIC PEPTIDE: NT-Pro BNP: 2275 pg/mL — ABNORMAL HIGH (ref 0–376)

## 2021-06-18 LAB — URIC ACID: Uric Acid: 5.9 mg/dL (ref 3.8–8.4)

## 2021-06-18 LAB — VITAMIN D 25 HYDROXY (VIT D DEFICIENCY, FRACTURES): Vit D, 25-Hydroxy: 24.1 ng/mL — ABNORMAL LOW (ref 30.0–100.0)

## 2021-06-18 NOTE — Telephone Encounter (Signed)
Gretchen from PT called and stated they wanted to see him once a week for 8 weeks, gave verbal on that but she also asked could patient take OTC tylenol for pain, myself and Lemmie Evens looked at his last labs and his liver test were elevated, would you like him to take tylenol or no?

## 2021-06-19 DIAGNOSIS — I252 Old myocardial infarction: Secondary | ICD-10-CM | POA: Diagnosis not present

## 2021-06-19 DIAGNOSIS — N183 Chronic kidney disease, stage 3 unspecified: Secondary | ICD-10-CM | POA: Diagnosis not present

## 2021-06-19 DIAGNOSIS — J449 Chronic obstructive pulmonary disease, unspecified: Secondary | ICD-10-CM | POA: Diagnosis not present

## 2021-06-19 DIAGNOSIS — Z8673 Personal history of transient ischemic attack (TIA), and cerebral infarction without residual deficits: Secondary | ICD-10-CM | POA: Diagnosis not present

## 2021-06-19 DIAGNOSIS — I482 Chronic atrial fibrillation, unspecified: Secondary | ICD-10-CM | POA: Diagnosis not present

## 2021-06-19 DIAGNOSIS — K219 Gastro-esophageal reflux disease without esophagitis: Secondary | ICD-10-CM | POA: Diagnosis not present

## 2021-06-19 DIAGNOSIS — G4733 Obstructive sleep apnea (adult) (pediatric): Secondary | ICD-10-CM | POA: Diagnosis not present

## 2021-06-19 DIAGNOSIS — Z7901 Long term (current) use of anticoagulants: Secondary | ICD-10-CM | POA: Diagnosis not present

## 2021-06-19 DIAGNOSIS — I5033 Acute on chronic diastolic (congestive) heart failure: Secondary | ICD-10-CM | POA: Diagnosis not present

## 2021-06-19 DIAGNOSIS — E871 Hypo-osmolality and hyponatremia: Secondary | ICD-10-CM | POA: Diagnosis not present

## 2021-06-19 DIAGNOSIS — I130 Hypertensive heart and chronic kidney disease with heart failure and stage 1 through stage 4 chronic kidney disease, or unspecified chronic kidney disease: Secondary | ICD-10-CM | POA: Diagnosis not present

## 2021-06-19 DIAGNOSIS — Z9981 Dependence on supplemental oxygen: Secondary | ICD-10-CM | POA: Diagnosis not present

## 2021-06-19 DIAGNOSIS — E039 Hypothyroidism, unspecified: Secondary | ICD-10-CM | POA: Diagnosis not present

## 2021-06-19 DIAGNOSIS — J9621 Acute and chronic respiratory failure with hypoxia: Secondary | ICD-10-CM | POA: Diagnosis not present

## 2021-06-19 DIAGNOSIS — M1990 Unspecified osteoarthritis, unspecified site: Secondary | ICD-10-CM | POA: Diagnosis not present

## 2021-06-19 DIAGNOSIS — Z7902 Long term (current) use of antithrombotics/antiplatelets: Secondary | ICD-10-CM | POA: Diagnosis not present

## 2021-06-19 DIAGNOSIS — E1122 Type 2 diabetes mellitus with diabetic chronic kidney disease: Secondary | ICD-10-CM | POA: Diagnosis not present

## 2021-06-20 MED ORDER — VITAMIN D (ERGOCALCIFEROL) 1.25 MG (50000 UNIT) PO CAPS: 50000.0000 [IU] | ORAL_CAPSULE | ORAL | 1 refills | Status: AC

## 2021-06-20 MED ORDER — METOPROLOL SUCCINATE ER 25 MG PO TB24: 12.5000 mg | ORAL_TABLET | Freq: Every day | ORAL | 2 refills | Status: DC

## 2021-06-20 MED ORDER — VORTIOXETINE HBR 10 MG PO TABS: 10.0000 mg | ORAL_TABLET | Freq: Every day | ORAL | 2 refills | Status: DC

## 2021-06-20 MED ORDER — ALBUTEROL SULFATE HFA 108 (90 BASE) MCG/ACT IN AERS: 2.0000 | INHALATION_SPRAY | Freq: Four times a day (QID) | RESPIRATORY_TRACT | 2 refills | Status: DC | PRN

## 2021-06-24 DIAGNOSIS — J9610 Chronic respiratory failure, unspecified whether with hypoxia or hypercapnia: Secondary | ICD-10-CM | POA: Diagnosis not present

## 2021-06-25 DIAGNOSIS — Z9981 Dependence on supplemental oxygen: Secondary | ICD-10-CM | POA: Diagnosis not present

## 2021-06-25 DIAGNOSIS — I252 Old myocardial infarction: Secondary | ICD-10-CM | POA: Diagnosis not present

## 2021-06-25 DIAGNOSIS — N183 Chronic kidney disease, stage 3 unspecified: Secondary | ICD-10-CM | POA: Diagnosis not present

## 2021-06-25 DIAGNOSIS — Z7901 Long term (current) use of anticoagulants: Secondary | ICD-10-CM | POA: Diagnosis not present

## 2021-06-25 DIAGNOSIS — I130 Hypertensive heart and chronic kidney disease with heart failure and stage 1 through stage 4 chronic kidney disease, or unspecified chronic kidney disease: Secondary | ICD-10-CM | POA: Diagnosis not present

## 2021-06-25 DIAGNOSIS — E871 Hypo-osmolality and hyponatremia: Secondary | ICD-10-CM | POA: Diagnosis not present

## 2021-06-25 DIAGNOSIS — E1122 Type 2 diabetes mellitus with diabetic chronic kidney disease: Secondary | ICD-10-CM | POA: Diagnosis not present

## 2021-06-25 DIAGNOSIS — I5033 Acute on chronic diastolic (congestive) heart failure: Secondary | ICD-10-CM | POA: Diagnosis not present

## 2021-06-25 DIAGNOSIS — M1990 Unspecified osteoarthritis, unspecified site: Secondary | ICD-10-CM | POA: Diagnosis not present

## 2021-06-25 DIAGNOSIS — J449 Chronic obstructive pulmonary disease, unspecified: Secondary | ICD-10-CM | POA: Diagnosis not present

## 2021-06-25 DIAGNOSIS — I482 Chronic atrial fibrillation, unspecified: Secondary | ICD-10-CM | POA: Diagnosis not present

## 2021-06-25 DIAGNOSIS — K219 Gastro-esophageal reflux disease without esophagitis: Secondary | ICD-10-CM | POA: Diagnosis not present

## 2021-06-25 DIAGNOSIS — Z8673 Personal history of transient ischemic attack (TIA), and cerebral infarction without residual deficits: Secondary | ICD-10-CM | POA: Diagnosis not present

## 2021-06-25 DIAGNOSIS — E039 Hypothyroidism, unspecified: Secondary | ICD-10-CM | POA: Diagnosis not present

## 2021-06-25 DIAGNOSIS — Z7902 Long term (current) use of antithrombotics/antiplatelets: Secondary | ICD-10-CM | POA: Diagnosis not present

## 2021-06-25 DIAGNOSIS — G4733 Obstructive sleep apnea (adult) (pediatric): Secondary | ICD-10-CM | POA: Diagnosis not present

## 2021-06-25 DIAGNOSIS — J9621 Acute and chronic respiratory failure with hypoxia: Secondary | ICD-10-CM | POA: Diagnosis not present

## 2021-06-26 DIAGNOSIS — I482 Chronic atrial fibrillation, unspecified: Secondary | ICD-10-CM | POA: Diagnosis not present

## 2021-06-26 DIAGNOSIS — I5033 Acute on chronic diastolic (congestive) heart failure: Secondary | ICD-10-CM | POA: Diagnosis not present

## 2021-06-26 DIAGNOSIS — J9621 Acute and chronic respiratory failure with hypoxia: Secondary | ICD-10-CM | POA: Diagnosis not present

## 2021-06-26 DIAGNOSIS — N183 Chronic kidney disease, stage 3 unspecified: Secondary | ICD-10-CM | POA: Diagnosis not present

## 2021-06-26 DIAGNOSIS — K219 Gastro-esophageal reflux disease without esophagitis: Secondary | ICD-10-CM | POA: Diagnosis not present

## 2021-06-26 DIAGNOSIS — Z7901 Long term (current) use of anticoagulants: Secondary | ICD-10-CM | POA: Diagnosis not present

## 2021-06-26 DIAGNOSIS — E1122 Type 2 diabetes mellitus with diabetic chronic kidney disease: Secondary | ICD-10-CM | POA: Diagnosis not present

## 2021-06-26 DIAGNOSIS — J449 Chronic obstructive pulmonary disease, unspecified: Secondary | ICD-10-CM | POA: Diagnosis not present

## 2021-06-26 DIAGNOSIS — I130 Hypertensive heart and chronic kidney disease with heart failure and stage 1 through stage 4 chronic kidney disease, or unspecified chronic kidney disease: Secondary | ICD-10-CM | POA: Diagnosis not present

## 2021-06-26 DIAGNOSIS — J9610 Chronic respiratory failure, unspecified whether with hypoxia or hypercapnia: Secondary | ICD-10-CM | POA: Diagnosis not present

## 2021-06-26 DIAGNOSIS — Z8673 Personal history of transient ischemic attack (TIA), and cerebral infarction without residual deficits: Secondary | ICD-10-CM | POA: Diagnosis not present

## 2021-06-26 DIAGNOSIS — E039 Hypothyroidism, unspecified: Secondary | ICD-10-CM | POA: Diagnosis not present

## 2021-06-26 DIAGNOSIS — I252 Old myocardial infarction: Secondary | ICD-10-CM | POA: Diagnosis not present

## 2021-06-26 DIAGNOSIS — E871 Hypo-osmolality and hyponatremia: Secondary | ICD-10-CM | POA: Diagnosis not present

## 2021-06-26 DIAGNOSIS — Z7902 Long term (current) use of antithrombotics/antiplatelets: Secondary | ICD-10-CM | POA: Diagnosis not present

## 2021-06-26 DIAGNOSIS — G4733 Obstructive sleep apnea (adult) (pediatric): Secondary | ICD-10-CM | POA: Diagnosis not present

## 2021-06-26 DIAGNOSIS — Z9981 Dependence on supplemental oxygen: Secondary | ICD-10-CM | POA: Diagnosis not present

## 2021-06-26 DIAGNOSIS — M1990 Unspecified osteoarthritis, unspecified site: Secondary | ICD-10-CM | POA: Diagnosis not present

## 2021-06-28 ENCOUNTER — Other Ambulatory Visit: Payer: Self-pay | Admitting: Family Medicine

## 2021-06-30 ENCOUNTER — Encounter: Payer: Self-pay | Admitting: Family Medicine

## 2021-06-30 DIAGNOSIS — R0609 Other forms of dyspnea: Secondary | ICD-10-CM | POA: Insufficient documentation

## 2021-06-30 DIAGNOSIS — R197 Diarrhea, unspecified: Secondary | ICD-10-CM | POA: Insufficient documentation

## 2021-06-30 DIAGNOSIS — M109 Gout, unspecified: Secondary | ICD-10-CM | POA: Insufficient documentation

## 2021-06-30 HISTORY — DX: Other forms of dyspnea: R06.09

## 2021-06-30 HISTORY — DX: Diarrhea, unspecified: R19.7

## 2021-06-30 HISTORY — DX: Gout, unspecified: M10.9

## 2021-06-30 NOTE — Assessment & Plan Note (Signed)
Restart vitamin D 50000 weekly.

## 2021-06-30 NOTE — Assessment & Plan Note (Signed)
Diarrhea has improved.

## 2021-06-30 NOTE — Assessment & Plan Note (Addendum)
Check probnp.  Continue torsemide 20 mg once daily.

## 2021-06-30 NOTE — Assessment & Plan Note (Signed)
Recommend continue Dulera 2 puffs twice a day. Albuterol hfa 2 puffs qid prn sob.

## 2021-06-30 NOTE — Assessment & Plan Note (Signed)
Review medications. List is not matching history.

## 2021-06-30 NOTE — Assessment & Plan Note (Signed)
ON O2 2 Liters continueous.

## 2021-06-30 NOTE — Assessment & Plan Note (Signed)
Recommend restart allopurinol 100 mg once daily.

## 2021-06-30 NOTE — Assessment & Plan Note (Signed)
Continue xarelto

## 2021-06-30 NOTE — Assessment & Plan Note (Signed)
Continue oxygen 2 L continuous.

## 2021-06-30 NOTE — Assessment & Plan Note (Signed)
Potassium replaced. Potassium Chloride 20 meq once daily.

## 2021-06-30 NOTE — Assessment & Plan Note (Addendum)
Orthostatic hypotension resolved.  Recommended she move slowly. Change positions slowly. Continue imdur 120 mg once daily, hydralazine 100 mg one tid, cardizem cd 120 mg once daily. Has NTG.

## 2021-07-03 ENCOUNTER — Other Ambulatory Visit: Payer: Self-pay | Admitting: Family Medicine

## 2021-07-03 DIAGNOSIS — E871 Hypo-osmolality and hyponatremia: Secondary | ICD-10-CM | POA: Diagnosis not present

## 2021-07-03 DIAGNOSIS — E039 Hypothyroidism, unspecified: Secondary | ICD-10-CM | POA: Diagnosis not present

## 2021-07-03 DIAGNOSIS — N183 Chronic kidney disease, stage 3 unspecified: Secondary | ICD-10-CM | POA: Diagnosis not present

## 2021-07-03 DIAGNOSIS — I130 Hypertensive heart and chronic kidney disease with heart failure and stage 1 through stage 4 chronic kidney disease, or unspecified chronic kidney disease: Secondary | ICD-10-CM | POA: Diagnosis not present

## 2021-07-03 DIAGNOSIS — J9621 Acute and chronic respiratory failure with hypoxia: Secondary | ICD-10-CM | POA: Diagnosis not present

## 2021-07-03 DIAGNOSIS — I5033 Acute on chronic diastolic (congestive) heart failure: Secondary | ICD-10-CM | POA: Diagnosis not present

## 2021-07-03 DIAGNOSIS — Z9981 Dependence on supplemental oxygen: Secondary | ICD-10-CM | POA: Diagnosis not present

## 2021-07-03 DIAGNOSIS — J449 Chronic obstructive pulmonary disease, unspecified: Secondary | ICD-10-CM | POA: Diagnosis not present

## 2021-07-03 DIAGNOSIS — Z8673 Personal history of transient ischemic attack (TIA), and cerebral infarction without residual deficits: Secondary | ICD-10-CM | POA: Diagnosis not present

## 2021-07-03 DIAGNOSIS — E1122 Type 2 diabetes mellitus with diabetic chronic kidney disease: Secondary | ICD-10-CM | POA: Diagnosis not present

## 2021-07-03 DIAGNOSIS — I252 Old myocardial infarction: Secondary | ICD-10-CM | POA: Diagnosis not present

## 2021-07-03 DIAGNOSIS — M1990 Unspecified osteoarthritis, unspecified site: Secondary | ICD-10-CM | POA: Diagnosis not present

## 2021-07-03 DIAGNOSIS — Z7901 Long term (current) use of anticoagulants: Secondary | ICD-10-CM | POA: Diagnosis not present

## 2021-07-03 DIAGNOSIS — I482 Chronic atrial fibrillation, unspecified: Secondary | ICD-10-CM | POA: Diagnosis not present

## 2021-07-03 DIAGNOSIS — Z7902 Long term (current) use of antithrombotics/antiplatelets: Secondary | ICD-10-CM | POA: Diagnosis not present

## 2021-07-03 DIAGNOSIS — G4733 Obstructive sleep apnea (adult) (pediatric): Secondary | ICD-10-CM | POA: Diagnosis not present

## 2021-07-03 DIAGNOSIS — K219 Gastro-esophageal reflux disease without esophagitis: Secondary | ICD-10-CM | POA: Diagnosis not present

## 2021-07-04 ENCOUNTER — Other Ambulatory Visit: Payer: Self-pay

## 2021-07-04 ENCOUNTER — Telehealth: Payer: Self-pay

## 2021-07-04 DIAGNOSIS — Z7902 Long term (current) use of antithrombotics/antiplatelets: Secondary | ICD-10-CM | POA: Diagnosis not present

## 2021-07-04 DIAGNOSIS — G4733 Obstructive sleep apnea (adult) (pediatric): Secondary | ICD-10-CM | POA: Diagnosis not present

## 2021-07-04 DIAGNOSIS — I5033 Acute on chronic diastolic (congestive) heart failure: Secondary | ICD-10-CM | POA: Diagnosis not present

## 2021-07-04 DIAGNOSIS — Z8673 Personal history of transient ischemic attack (TIA), and cerebral infarction without residual deficits: Secondary | ICD-10-CM | POA: Diagnosis not present

## 2021-07-04 DIAGNOSIS — J449 Chronic obstructive pulmonary disease, unspecified: Secondary | ICD-10-CM | POA: Diagnosis not present

## 2021-07-04 DIAGNOSIS — I482 Chronic atrial fibrillation, unspecified: Secondary | ICD-10-CM | POA: Diagnosis not present

## 2021-07-04 DIAGNOSIS — E039 Hypothyroidism, unspecified: Secondary | ICD-10-CM | POA: Diagnosis not present

## 2021-07-04 DIAGNOSIS — J9621 Acute and chronic respiratory failure with hypoxia: Secondary | ICD-10-CM | POA: Diagnosis not present

## 2021-07-04 DIAGNOSIS — E1122 Type 2 diabetes mellitus with diabetic chronic kidney disease: Secondary | ICD-10-CM | POA: Diagnosis not present

## 2021-07-04 DIAGNOSIS — I252 Old myocardial infarction: Secondary | ICD-10-CM | POA: Diagnosis not present

## 2021-07-04 DIAGNOSIS — M1990 Unspecified osteoarthritis, unspecified site: Secondary | ICD-10-CM | POA: Diagnosis not present

## 2021-07-04 DIAGNOSIS — Z7901 Long term (current) use of anticoagulants: Secondary | ICD-10-CM | POA: Diagnosis not present

## 2021-07-04 DIAGNOSIS — K219 Gastro-esophageal reflux disease without esophagitis: Secondary | ICD-10-CM | POA: Diagnosis not present

## 2021-07-04 DIAGNOSIS — Z9981 Dependence on supplemental oxygen: Secondary | ICD-10-CM | POA: Diagnosis not present

## 2021-07-04 DIAGNOSIS — E871 Hypo-osmolality and hyponatremia: Secondary | ICD-10-CM | POA: Diagnosis not present

## 2021-07-04 DIAGNOSIS — I130 Hypertensive heart and chronic kidney disease with heart failure and stage 1 through stage 4 chronic kidney disease, or unspecified chronic kidney disease: Secondary | ICD-10-CM | POA: Diagnosis not present

## 2021-07-04 DIAGNOSIS — N183 Chronic kidney disease, stage 3 unspecified: Secondary | ICD-10-CM | POA: Diagnosis not present

## 2021-07-04 NOTE — Telephone Encounter (Signed)
Morey Hummingbird from [facility] called with concerns about Jon [name].  He has not been taking his medications as directed even with the planner.  His diet is very poor and he is only getting one meal a day.  [facility] is coming out tomorrow for an evaluation.  Dr. Tobie Poet approved the evaluation.   Morey Hummingbird was encouraged to call us back if needed.

## 2021-07-04 NOTE — Progress Notes (Signed)
Called patient x 3, no answer - went to VM after two rings.

## 2021-07-09 DIAGNOSIS — I5033 Acute on chronic diastolic (congestive) heart failure: Secondary | ICD-10-CM | POA: Diagnosis not present

## 2021-07-09 DIAGNOSIS — E1122 Type 2 diabetes mellitus with diabetic chronic kidney disease: Secondary | ICD-10-CM | POA: Diagnosis not present

## 2021-07-09 DIAGNOSIS — I130 Hypertensive heart and chronic kidney disease with heart failure and stage 1 through stage 4 chronic kidney disease, or unspecified chronic kidney disease: Secondary | ICD-10-CM | POA: Diagnosis not present

## 2021-07-11 DIAGNOSIS — Z7901 Long term (current) use of anticoagulants: Secondary | ICD-10-CM | POA: Diagnosis not present

## 2021-07-11 DIAGNOSIS — J9621 Acute and chronic respiratory failure with hypoxia: Secondary | ICD-10-CM | POA: Diagnosis not present

## 2021-07-11 DIAGNOSIS — E871 Hypo-osmolality and hyponatremia: Secondary | ICD-10-CM | POA: Diagnosis not present

## 2021-07-11 DIAGNOSIS — I130 Hypertensive heart and chronic kidney disease with heart failure and stage 1 through stage 4 chronic kidney disease, or unspecified chronic kidney disease: Secondary | ICD-10-CM | POA: Diagnosis not present

## 2021-07-11 DIAGNOSIS — E039 Hypothyroidism, unspecified: Secondary | ICD-10-CM | POA: Diagnosis not present

## 2021-07-11 DIAGNOSIS — I5033 Acute on chronic diastolic (congestive) heart failure: Secondary | ICD-10-CM | POA: Diagnosis not present

## 2021-07-11 DIAGNOSIS — Z7902 Long term (current) use of antithrombotics/antiplatelets: Secondary | ICD-10-CM | POA: Diagnosis not present

## 2021-07-11 DIAGNOSIS — G4733 Obstructive sleep apnea (adult) (pediatric): Secondary | ICD-10-CM | POA: Diagnosis not present

## 2021-07-11 DIAGNOSIS — J449 Chronic obstructive pulmonary disease, unspecified: Secondary | ICD-10-CM | POA: Diagnosis not present

## 2021-07-11 DIAGNOSIS — Z9981 Dependence on supplemental oxygen: Secondary | ICD-10-CM | POA: Diagnosis not present

## 2021-07-11 DIAGNOSIS — N183 Chronic kidney disease, stage 3 unspecified: Secondary | ICD-10-CM | POA: Diagnosis not present

## 2021-07-11 DIAGNOSIS — Z8673 Personal history of transient ischemic attack (TIA), and cerebral infarction without residual deficits: Secondary | ICD-10-CM | POA: Diagnosis not present

## 2021-07-11 DIAGNOSIS — M1990 Unspecified osteoarthritis, unspecified site: Secondary | ICD-10-CM | POA: Diagnosis not present

## 2021-07-11 DIAGNOSIS — I482 Chronic atrial fibrillation, unspecified: Secondary | ICD-10-CM | POA: Diagnosis not present

## 2021-07-11 DIAGNOSIS — K219 Gastro-esophageal reflux disease without esophagitis: Secondary | ICD-10-CM | POA: Diagnosis not present

## 2021-07-11 DIAGNOSIS — I252 Old myocardial infarction: Secondary | ICD-10-CM | POA: Diagnosis not present

## 2021-07-11 DIAGNOSIS — E1122 Type 2 diabetes mellitus with diabetic chronic kidney disease: Secondary | ICD-10-CM | POA: Diagnosis not present

## 2021-07-12 DIAGNOSIS — G4733 Obstructive sleep apnea (adult) (pediatric): Secondary | ICD-10-CM | POA: Diagnosis not present

## 2021-07-12 DIAGNOSIS — E039 Hypothyroidism, unspecified: Secondary | ICD-10-CM | POA: Diagnosis not present

## 2021-07-12 DIAGNOSIS — I252 Old myocardial infarction: Secondary | ICD-10-CM | POA: Diagnosis not present

## 2021-07-12 DIAGNOSIS — E871 Hypo-osmolality and hyponatremia: Secondary | ICD-10-CM | POA: Diagnosis not present

## 2021-07-12 DIAGNOSIS — M1990 Unspecified osteoarthritis, unspecified site: Secondary | ICD-10-CM | POA: Diagnosis not present

## 2021-07-12 DIAGNOSIS — I130 Hypertensive heart and chronic kidney disease with heart failure and stage 1 through stage 4 chronic kidney disease, or unspecified chronic kidney disease: Secondary | ICD-10-CM | POA: Diagnosis not present

## 2021-07-12 DIAGNOSIS — K219 Gastro-esophageal reflux disease without esophagitis: Secondary | ICD-10-CM | POA: Diagnosis not present

## 2021-07-12 DIAGNOSIS — J9621 Acute and chronic respiratory failure with hypoxia: Secondary | ICD-10-CM | POA: Diagnosis not present

## 2021-07-12 DIAGNOSIS — I482 Chronic atrial fibrillation, unspecified: Secondary | ICD-10-CM | POA: Diagnosis not present

## 2021-07-12 DIAGNOSIS — Z7901 Long term (current) use of anticoagulants: Secondary | ICD-10-CM | POA: Diagnosis not present

## 2021-07-12 DIAGNOSIS — Z7902 Long term (current) use of antithrombotics/antiplatelets: Secondary | ICD-10-CM | POA: Diagnosis not present

## 2021-07-12 DIAGNOSIS — E1122 Type 2 diabetes mellitus with diabetic chronic kidney disease: Secondary | ICD-10-CM | POA: Diagnosis not present

## 2021-07-12 DIAGNOSIS — I5033 Acute on chronic diastolic (congestive) heart failure: Secondary | ICD-10-CM | POA: Diagnosis not present

## 2021-07-12 DIAGNOSIS — N183 Chronic kidney disease, stage 3 unspecified: Secondary | ICD-10-CM | POA: Diagnosis not present

## 2021-07-12 DIAGNOSIS — Z8673 Personal history of transient ischemic attack (TIA), and cerebral infarction without residual deficits: Secondary | ICD-10-CM | POA: Diagnosis not present

## 2021-07-12 DIAGNOSIS — Z9981 Dependence on supplemental oxygen: Secondary | ICD-10-CM | POA: Diagnosis not present

## 2021-07-12 DIAGNOSIS — J449 Chronic obstructive pulmonary disease, unspecified: Secondary | ICD-10-CM | POA: Diagnosis not present

## 2021-07-16 DIAGNOSIS — E1122 Type 2 diabetes mellitus with diabetic chronic kidney disease: Secondary | ICD-10-CM | POA: Diagnosis not present

## 2021-07-16 DIAGNOSIS — I252 Old myocardial infarction: Secondary | ICD-10-CM | POA: Diagnosis not present

## 2021-07-16 DIAGNOSIS — M1990 Unspecified osteoarthritis, unspecified site: Secondary | ICD-10-CM | POA: Diagnosis not present

## 2021-07-16 DIAGNOSIS — Z7901 Long term (current) use of anticoagulants: Secondary | ICD-10-CM | POA: Diagnosis not present

## 2021-07-16 DIAGNOSIS — I5033 Acute on chronic diastolic (congestive) heart failure: Secondary | ICD-10-CM | POA: Diagnosis not present

## 2021-07-16 DIAGNOSIS — Z8673 Personal history of transient ischemic attack (TIA), and cerebral infarction without residual deficits: Secondary | ICD-10-CM | POA: Diagnosis not present

## 2021-07-16 DIAGNOSIS — I130 Hypertensive heart and chronic kidney disease with heart failure and stage 1 through stage 4 chronic kidney disease, or unspecified chronic kidney disease: Secondary | ICD-10-CM | POA: Diagnosis not present

## 2021-07-16 DIAGNOSIS — J449 Chronic obstructive pulmonary disease, unspecified: Secondary | ICD-10-CM | POA: Diagnosis not present

## 2021-07-16 DIAGNOSIS — J9621 Acute and chronic respiratory failure with hypoxia: Secondary | ICD-10-CM | POA: Diagnosis not present

## 2021-07-16 DIAGNOSIS — G4733 Obstructive sleep apnea (adult) (pediatric): Secondary | ICD-10-CM | POA: Diagnosis not present

## 2021-07-16 DIAGNOSIS — Z9981 Dependence on supplemental oxygen: Secondary | ICD-10-CM | POA: Diagnosis not present

## 2021-07-16 DIAGNOSIS — I482 Chronic atrial fibrillation, unspecified: Secondary | ICD-10-CM | POA: Diagnosis not present

## 2021-07-16 DIAGNOSIS — E039 Hypothyroidism, unspecified: Secondary | ICD-10-CM | POA: Diagnosis not present

## 2021-07-16 DIAGNOSIS — N183 Chronic kidney disease, stage 3 unspecified: Secondary | ICD-10-CM | POA: Diagnosis not present

## 2021-07-16 DIAGNOSIS — K219 Gastro-esophageal reflux disease without esophagitis: Secondary | ICD-10-CM | POA: Diagnosis not present

## 2021-07-16 DIAGNOSIS — E871 Hypo-osmolality and hyponatremia: Secondary | ICD-10-CM | POA: Diagnosis not present

## 2021-07-16 DIAGNOSIS — Z7902 Long term (current) use of antithrombotics/antiplatelets: Secondary | ICD-10-CM | POA: Diagnosis not present

## 2021-07-17 DIAGNOSIS — I130 Hypertensive heart and chronic kidney disease with heart failure and stage 1 through stage 4 chronic kidney disease, or unspecified chronic kidney disease: Secondary | ICD-10-CM | POA: Diagnosis not present

## 2021-07-17 DIAGNOSIS — E1122 Type 2 diabetes mellitus with diabetic chronic kidney disease: Secondary | ICD-10-CM | POA: Diagnosis not present

## 2021-07-17 DIAGNOSIS — N183 Chronic kidney disease, stage 3 unspecified: Secondary | ICD-10-CM | POA: Diagnosis not present

## 2021-07-17 DIAGNOSIS — Z7901 Long term (current) use of anticoagulants: Secondary | ICD-10-CM | POA: Diagnosis not present

## 2021-07-17 DIAGNOSIS — E871 Hypo-osmolality and hyponatremia: Secondary | ICD-10-CM | POA: Diagnosis not present

## 2021-07-17 DIAGNOSIS — J449 Chronic obstructive pulmonary disease, unspecified: Secondary | ICD-10-CM | POA: Diagnosis not present

## 2021-07-17 DIAGNOSIS — Z8673 Personal history of transient ischemic attack (TIA), and cerebral infarction without residual deficits: Secondary | ICD-10-CM | POA: Diagnosis not present

## 2021-07-17 DIAGNOSIS — I5033 Acute on chronic diastolic (congestive) heart failure: Secondary | ICD-10-CM | POA: Diagnosis not present

## 2021-07-17 DIAGNOSIS — I252 Old myocardial infarction: Secondary | ICD-10-CM | POA: Diagnosis not present

## 2021-07-17 DIAGNOSIS — Z9981 Dependence on supplemental oxygen: Secondary | ICD-10-CM | POA: Diagnosis not present

## 2021-07-17 DIAGNOSIS — K219 Gastro-esophageal reflux disease without esophagitis: Secondary | ICD-10-CM | POA: Diagnosis not present

## 2021-07-17 DIAGNOSIS — E039 Hypothyroidism, unspecified: Secondary | ICD-10-CM | POA: Diagnosis not present

## 2021-07-17 DIAGNOSIS — J9621 Acute and chronic respiratory failure with hypoxia: Secondary | ICD-10-CM | POA: Diagnosis not present

## 2021-07-17 DIAGNOSIS — M1990 Unspecified osteoarthritis, unspecified site: Secondary | ICD-10-CM | POA: Diagnosis not present

## 2021-07-17 DIAGNOSIS — G4733 Obstructive sleep apnea (adult) (pediatric): Secondary | ICD-10-CM | POA: Diagnosis not present

## 2021-07-17 DIAGNOSIS — Z7902 Long term (current) use of antithrombotics/antiplatelets: Secondary | ICD-10-CM | POA: Diagnosis not present

## 2021-07-17 DIAGNOSIS — I482 Chronic atrial fibrillation, unspecified: Secondary | ICD-10-CM | POA: Diagnosis not present

## 2021-07-22 ENCOUNTER — Telehealth: Payer: Self-pay

## 2021-07-22 DIAGNOSIS — I5033 Acute on chronic diastolic (congestive) heart failure: Secondary | ICD-10-CM | POA: Diagnosis not present

## 2021-07-22 DIAGNOSIS — E871 Hypo-osmolality and hyponatremia: Secondary | ICD-10-CM | POA: Diagnosis not present

## 2021-07-22 DIAGNOSIS — Z8673 Personal history of transient ischemic attack (TIA), and cerebral infarction without residual deficits: Secondary | ICD-10-CM | POA: Diagnosis not present

## 2021-07-22 DIAGNOSIS — E039 Hypothyroidism, unspecified: Secondary | ICD-10-CM | POA: Diagnosis not present

## 2021-07-22 DIAGNOSIS — I252 Old myocardial infarction: Secondary | ICD-10-CM | POA: Diagnosis not present

## 2021-07-22 DIAGNOSIS — J449 Chronic obstructive pulmonary disease, unspecified: Secondary | ICD-10-CM | POA: Diagnosis not present

## 2021-07-22 DIAGNOSIS — Z7902 Long term (current) use of antithrombotics/antiplatelets: Secondary | ICD-10-CM | POA: Diagnosis not present

## 2021-07-22 DIAGNOSIS — M1990 Unspecified osteoarthritis, unspecified site: Secondary | ICD-10-CM | POA: Diagnosis not present

## 2021-07-22 DIAGNOSIS — I130 Hypertensive heart and chronic kidney disease with heart failure and stage 1 through stage 4 chronic kidney disease, or unspecified chronic kidney disease: Secondary | ICD-10-CM | POA: Diagnosis not present

## 2021-07-22 DIAGNOSIS — J9621 Acute and chronic respiratory failure with hypoxia: Secondary | ICD-10-CM | POA: Diagnosis not present

## 2021-07-22 DIAGNOSIS — Z9981 Dependence on supplemental oxygen: Secondary | ICD-10-CM | POA: Diagnosis not present

## 2021-07-22 DIAGNOSIS — I482 Chronic atrial fibrillation, unspecified: Secondary | ICD-10-CM | POA: Diagnosis not present

## 2021-07-22 DIAGNOSIS — K219 Gastro-esophageal reflux disease without esophagitis: Secondary | ICD-10-CM | POA: Diagnosis not present

## 2021-07-22 DIAGNOSIS — E1122 Type 2 diabetes mellitus with diabetic chronic kidney disease: Secondary | ICD-10-CM | POA: Diagnosis not present

## 2021-07-22 DIAGNOSIS — Z7901 Long term (current) use of anticoagulants: Secondary | ICD-10-CM | POA: Diagnosis not present

## 2021-07-22 DIAGNOSIS — G4733 Obstructive sleep apnea (adult) (pediatric): Secondary | ICD-10-CM | POA: Diagnosis not present

## 2021-07-22 DIAGNOSIS — N183 Chronic kidney disease, stage 3 unspecified: Secondary | ICD-10-CM | POA: Diagnosis not present

## 2021-07-22 NOTE — Chronic Care Management (AMB) (Signed)
Chronic Care Management Pharmacy Assistant   Name: JAVON SNEE  MRN: [id number] DOB: 03-26-1951   Reason for Encounter: Disease State call for DM     Recent office visits:  06/18/21 Telephone Encounter. Sung Amabile, [place] CMA. Advised to take OTC Tylenol.  06/17/21 Rochel Brome MD. Seen for CHF. Started on Albuterol inhaler 108 mcg/act 2 puffs every 6 hours prn. Started on Vortioxetine HBR 10 mg daily. Decreased Famotidine from 40 mg 2 times daily to 40 mg in the am. Started on Hydralazine HCI 100 mg 3 times daily. Decreased Metoprolol Succinate from 50 mg daily to 12.5 daily. D/C Lyrica 75 mg twice daily due to side effects . D/C Semaglutide 1 mg weekly due to change in therapy. D/C Sertraline HCI 50 mg daily due to ineffective. Instructed to restart Vitamin D 50000 weekly. Recommended restart Allopurinol 100 mg daily.  Recent consult visits:  None   Hospital visits:  None   Medications: Outpatient Encounter Medications as of 07/22/2021  Medication Sig   albuterol (VENTOLIN HFA) 108 (90 Base) MCG/ACT inhaler Inhale 2 puffs into the lungs every 6 (six) hours as needed for wheezing or shortness of breath.   allopurinol (ZYLOPRIM) 100 MG tablet Take 100 mg by mouth daily.   atorvastatin (LIPITOR) 40 MG tablet TAKE 1 TABLET BY MOUTH DAILY   Azelastine HCl 0.15 % SOLN USE 1 SPRAY IN EACH NOSTRIL TWICE DAILY   clopidogrel (PLAVIX) 75 MG tablet Take 75 mg by mouth daily.   Continuous Blood Gluc Receiver (DEXCOM G6 RECEIVER) DEVI 1 each by Does not apply route 4 (four) times daily -  before meals and at bedtime.   Continuous Blood Gluc Sensor (DEXCOM G6 SENSOR) MISC Apply every 10 days   diltiazem (CARDIZEM CD) 120 MG 24 hr capsule Take 1 capsule (120 mg total) by mouth daily.   famotidine (PEPCID) 20 MG tablet Take 40 mg by mouth in the morning.   ferrous sulfate 325 (65 FE) MG tablet Take 325 mg by mouth daily.   Glucagon, rDNA, (GLUCAGON EMERGENCY) 1 MG KIT Inject 1 mg as directed  as needed (low blood sugar).    hydrALAZINE (APRESOLINE) 100 MG tablet Take 100 mg by mouth in the morning, at noon, and at bedtime.   hydrOXYzine (ATARAX/VISTARIL) 50 MG tablet Take 1 tablet (50 mg total) by mouth 3 (three) times daily as needed.   Insulin Pen Needle (BD PEN NEEDLE NANO 2ND GEN) 32G X 4 MM MISC USE DAILY WITH BASAGLAR   isosorbide mononitrate (IMDUR) 120 MG 24 hr tablet TAKE 1 TABLET BY MOUTH ONCE DAILY IN THEMORNINGS   latanoprost (XALATAN) 0.005 % ophthalmic solution SMARTSIG:In Eye(s)   metoprolol succinate (TOPROL-XL) 25 MG 24 hr tablet Take 0.5 tablets (12.5 mg total) by mouth daily. Take with or immediately following a meal.   mometasone-formoterol (DULERA) 200-5 MCG/ACT AERO Inhale 2 puffs into the lungs 2 (two) times daily as needed for wheezing.   nitroGLYCERIN (NITROSTAT) 0.4 MG SL tablet Place 0.4 mg under the tongue every 5 (five) minutes as needed for chest pain.    OXYGEN Inhale into the lungs. 3 L/min.   pantoprazole (PROTONIX) 40 MG tablet Take 1 tablet (40 mg total) by mouth in the morning.   potassium chloride SA (KLOR-CON) 20 MEQ tablet Take 20 mEq by mouth daily.   Rivaroxaban (XARELTO) 15 MG TABS tablet Take 1 tablet (15 mg total) by mouth at bedtime.   tamsulosin (FLOMAX) 0.4 MG CAPS capsule TAKE 2 CAPSULES  BY MOUTH DAILY AFTER SUPPER   torsemide (DEMADEX) 20 MG tablet Take 20 mg by mouth daily.   Vitamin D, Ergocalciferol, (DRISDOL) 1.25 MG (50000 UNIT) CAPS capsule Take 1 capsule (50,000 Units total) by mouth once a week.   vortioxetine HBr (TRINTELLIX) 10 MG TABS tablet Take 1 tablet (10 mg total) by mouth daily.   No facility-administered encounter medications on file as of 07/22/2021.   Recent Relevant Labs: Lab Results  Component Value Date/Time   HGBA1C 7.9 (H) 05/31/2021 10:43 AM   HGBA1C 8.6 (H) 02/26/2021 10:31 AM    Kidney Function Lab Results  Component Value Date/Time   CREATININE 1.87 (H) 06/17/2021 03:31 PM   CREATININE 1.58 (H)  05/31/2021 10:42 AM   GFRNONAA 30 (L) 08/28/2020 02:10 PM   GFRAA 35 (L) 08/28/2020 02:10 PM     Current antihyperglycemic regimen:  Glucagon emergency kit Levemir 26 units daily  Novolin R flex pen sliding scale before meals Ozempic 1 mg weekly    Patient verbally confirms he is taking the above medications as directed.   What recent interventions/DTPs have been made to improve glycemic control:    How often are you checking your blood sugar?   What are your blood sugars ranging?    On insulin?  How many units:  During the week, how often does your blood glucose drop below 70?   Are you checking your feet daily/regularly?   Adherence Review: Is the patient currently on a STATIN medication? Yes Is the patient currently on ACE/ARB medication? Yes Does the patient have >5 day gap between last estimated fill dates? CPP to review  Care Gaps: Last eye exam / Retinopathy Screening? 07/11/20 Last Annual Wellness Visit?  Last Diabetic Foot Exam? 02/28/15   Star Rating Drugs:  Medication:  Last Fill: Day Supply Atorvastatin     Elray Mcgregor, [place] Pharmacist Assistant  [phone]   3 attempts were made to contact pt and all were unsuccessful

## 2021-07-23 NOTE — Progress Notes (Signed)
Cardiology Office Note:    Date:  07/24/2021   ID:  Jon [name], DOB Jan 13, 1951, MRN [id number]  PCP:  Rochel Brome, MD  Cardiologist:  Shirlee More, MD    Referring MD: Rochel Brome, MD    ASSESSMENT:    1. Chronic atrial fibrillation ([place])   2. Chronic anticoagulation-Xarelto   3. Hypertensive heart disease with heart failure ([place])   4. SOB (shortness of breath)   5. Mixed hyperlipidemia   6. Orthostatic hypotension   7. Falls frequently   8. Chronic diarrhea    PLAN:    In order of problems listed above:  I have known Jon [name] for many years and he continues to progressively deteriorate he has become very frail chronically ill falls frequently and anticipates stay well as an alternative to a nursing home.  Atrial fibrillation is rate controlled he takes combination anticoagulant clopidogrel with recurrent stroke and anticoagulation.  We will continue calcium channel blocker beta-blocker and his current antithrombotic treatment Stable he has a marked drop in systolic blood pressure 50 to 60 mm on standing and I would not intensify his antihypertensive treatment Heart failure is compensated continue his current diuretic he is at high risk of recurrent electrolyte abnormalities we will draw a BMP and proBNP today Continue lipid-lowering treatment atorvastatin I challenged him to remind healthcare practitioners to check his standing blood pressure with frequent falls Asked him discuss with his PCP chronic diarrhea since hospital discharge contributing to weakness and falls   Next appointment: 6 months with me   Medication Adjustments/Labs and Tests Ordered: Current medicines are reviewed at length with the patient today.  Concerns regarding medicines are outlined above.  Orders Placed This Encounter  Procedures   Basic metabolic panel   Pro b natriuretic peptide (BNP)   No orders of the defined types were placed in this encounter.   Chief Complaint  Patient presents  with   Follow-up   Atrial Fibrillation   Anticoagulation   Chronic Kidney Disease   Hypertension   .[facility]  History of Present Illness:    Jon [name] is a 70 y.o. male with a hx of  mild CAD, CHF, Atrial Fibrillation, HTN with stage 4 CKD, CVA, COPD and sleep apnea  last seen 08/07/2020.  He was seen at [facility] ED 06/01/2021 with a complaint of weakness and abnormal laboratory test.  He had an episode of orthostatic lightheadedness presented to the ED with a blood pressure of 215/98 with a pulse of 86 bpm repeat blood pressure 179/63.  His laboratory studies showed a serum potassium 3.8 creatinine 1.6 GFR 43 cc.  Hemoglobin is normal 15.2 and the EKG was described as atrial fibrillation with a controlled ventricular rate.  There is also notation that his magnesium was depleted but I do not see it in the report his proBNP level is elevated 2200.  He was admitted to the hospital subsequently 06/10/2021 with a diagnosis of hyponatremia and decompensated heart failure and in contrast to the previous admission his proBNP level was greater than 3000 his electrolyte abnormalities were treated at discharge sodium 3.7 creatinine 1.70.  I independently reviewed his EKG 06/10/2021 showing atrial fibrillation controlled ventricular rate nonspecific conduction.   Compliance with diet, lifestyle and medications: Yes  Is somewhat discouraged anticipates going to stay well tomorrow nursing home He still has lightheadedness when he stands up and has a 50 to 60 mm drop in systolic blood pressure. He continues to have diarrhea since hospitalization 5-6 times  a day and encouraged him to speak with his family doctor about that. He is unsteady has had several falls including this morning coming out the door He does not have edema shortness of breath chest pain palpitation or syncope.  09/26/2020 echocardiogram: Revealed normal left ventricular ejection fraction severe LVH right ventricle is mildly  enlarged normal function normal pulmonary artery pressure.  No significant valvular abnormality   1. Left ventricular ejection fraction, by estimation, is 55 to 60%. The  left ventricle has normal function. The left ventricle has no regional  wall motion abnormalities. There is severe concentric left ventricular  hypertrophy. Left ventricular diastolic   parameters are indeterminate.   2. Right ventricular systolic function is normal. The right ventricular  size is mildly enlarged. There is normal pulmonary artery systolic  pressure.   3. The mitral valve is normal in structure. No evidence of mitral valve  regurgitation. No evidence of mitral stenosis.   4. The aortic valve is normal in structure. Aortic valve regurgitation is  not visualized. Mild aortic valve sclerosis is present, with no evidence  of aortic valve stenosis.   5. The inferior vena cava is normal in size with greater than 50%  respiratory variability, suggesting right atrial pressure of 3 mmHg. Past Medical History:  Diagnosis Date   Abnormal EKG 11/29/2014   Acute renal insufficiency 11/29/2014   Acute venous embolism and thrombosis of deep vessels of distal lower extremity ([place]) 11/13/2019   Need to check venous dopplers for possible DVT due to tissure injury   Angina    Arthritis    "knees" (01/18/2014)   Asthma    Atrial fibrillation ([place])    Back pain 01/25/2015   Bariatric surgery status 11/03/2013   BOOP (bronchiolitis obliterans with organizing pneumonia) ([place]) 01/20/2014   OLBx 01/20/14 BOOP Started steroids 02/28/14    CHF (congestive heart failure) (HCC)    Chronic anticoagulation-Xarelto 02/09/2014   Chronic atrial fibrillation (HCC) 01/03/2014   Chronic bronchitis ([place]) 12/21/2013   Chronic diastolic heart failure ([place]) 02/22/2015   Chronic pain of both knees 03/18/2017   Overview:  Added automatically from request for surgery [id number]   Chronic respiratory failure ([place]) 04/10/2015   Chronic respiratory failure  with hypoxia ([place]) 02/11/2020   Closed fracture of multiple carpal bones 03/07/2020   COPD (chronic obstructive pulmonary disease) ([place]) 01/25/2015   Cough    coughing up blood- started last nite, seems to be worse now   CVA (cerebral vascular accident) ([place]) 02/09/2014   Diabetic polyneuropathy associated with type 2 diabetes mellitus ([place]) 12/21/2013   Diabetic ulcer of toe of right foot associated with type 2 diabetes mellitus, with necrosis of bone ([place])    Diabetic ulcer of toe of right foot associated with type 2 diabetes mellitus, with necrosis of bone ([place])    DM (diabetes mellitus), type 2 with renal complications ([place]) 0/10/5425   Drug induced constipation 11/10/2017   Dysrhythmia    atrial fib takes cardizem,    Fibromyalgia    GERD (gastroesophageal reflux disease)    H/O hiatal hernia    Hemoptysis 08/01/2015   Herpes zoster 06/26/2015   History of stroke    Hyperkalemia-repeat pending 11/29/2014   Hyperlipemia    Hyperlipidemia    Hypertension    Hypertensive heart disease with heart failure (HCC)    Hypothyroidism    LBBB (left bundle branch block) 02/22/2015   Long term (current) use of anticoagulants 03/21/2014   Mild CAD 02/20/2015   Overview:  On recent cardiac cath  Overview:  On recent cardiac cath   Myocardial infarction [facility])    "one dr says yes; another says no"   Myocardial infarction [id number])    "one dr says yes; another says no"   Neuromuscular disorder ([place])    rls, neuropathy   Neuropathy    rls, neuropathy    Normal coronary arteries 2011 11/29/2014   Obesity (BMI 30-39.9)    On home oxygen therapy    "2L w/CPAP at bedtime" (09/01/2018)   OSA on CPAP    cpap & oxygen   Pericardial effusion    Peripheral vascular disease (HCC)    Peritoneal effusion, chronic 02/22/2015   Overview:  With surgical window   PNA (pneumonia) 04/10/2015   Pneumonia 5/15   "several times" (01/18/2014)   Precordial pain 01/26/2015   Preoperative cardiovascular examination 02/24/2016    Primary osteoarthritis of both knees 03/18/2017   Overview:  Added automatically from request for surgery [id number]   PVD (peripheral vascular disease) ([place]) 09/28/2018   Restless leg syndrome    Sepsis ([place]) 06/16/2021   Sleep apnea    cpap & oxygen    Stroke ([place]) 2012   denies residual on 01/18/2014   Troponin level elevated 11/29/2014   Ulcers of both lower legs ([place]) 8/46/6599   Uncomplicated opioid dependence ([place]) 12/17/2019   Uncontrolled type 2 diabetes mellitus with diabetic polyneuropathy, with long-term current use of insulin 12/21/2013   type ?    Wound of buttock 06/16/2021    Past Surgical History:  Procedure Laterality Date   ABDOMINAL AORTOGRAM W/LOWER EXTREMITY N/A 09/08/2018   Procedure: ABDOMINAL AORTOGRAM W/LOWER EXTREMITY;  Surgeon: Marty Heck, MD;  Location: [place] [facility];  Service: Cardiovascular;  Laterality: N/A;   AMPUTATION Right 09/09/2018   Procedure: AMPUTATION RIGHT GREAT TOE;  Surgeon: Waynetta Sandy, MD;  Location: [place];  Service: Vascular;  Laterality: Right;   AMPUTATION TOE Right 05/04/2020   Procedure: AMPUTATION TOE RIGHT 2nd TOE;  Surgeon: Evelina Bucy, DPM;  Location: [facility];  Service: Podiatry;  Laterality: Right;   APPENDECTOMY     CARDIAC CATHETERIZATION  X 2 then 03/03/2012    NL LVF, normal coronaries, vessels are small ([facility]: Dr. Beatrix Fetters)   CATARACT EXTRACTION W/ INTRAOCULAR LENS  IMPLANT, BILATERAL Bilateral    CHOLECYSTECTOMY     HERNIA REPAIR     UHR   LAPAROSCOPIC GASTRIC BANDING  2010   LOWER EXTREMITY ANGIOGRAPHY Left 10/11/2018   Procedure: LOWER EXTREMITY ANGIOGRAPHY;  Surgeon: Waynetta Sandy, MD;  Location: [place] [facility];  Service: Cardiovascular;  Laterality: Left;   PERICARDIAL WINDOW Left 01/24/2014   Procedure: PERICARDIAL WINDOW;  Surgeon: Gaye Pollack, MD;  Location: [place];  Service: Thoracic;  Laterality: Left;   PERIPHERAL VASCULAR INTERVENTION Right 09/08/2018   Procedure: PERIPHERAL  VASCULAR INTERVENTION;  Surgeon: Marty Heck, MD;  Location: [place] [facility];  Service: Cardiovascular;  Laterality: Right;   SINUS EXPLORATION  X 2   TEE WITHOUT CARDIOVERSION N/A 09/07/2018   Procedure: TRANSESOPHAGEAL ECHOCARDIOGRAM (TEE);  Surgeon: Acie Fredrickson Wonda Cheng, MD;  Location: [facility] ENDOSCOPY;  Service: Cardiovascular;  Laterality: N/A;   UMBILICAL HERNIA REPAIR     VIDEO ASSISTED THORACOSCOPY Left 01/24/2014   Procedure: VIDEO ASSISTED THORACOSCOPY;  Surgeon: Gaye Pollack, MD;  Location: [facility] OR;  Service: Thoracic;  Laterality: Left;  VATS/open lung biopsy   VIDEO BRONCHOSCOPY Bilateral 12/29/2013   Procedure: VIDEO BRONCHOSCOPY WITHOUT FLUORO;  Surgeon: Elsie Stain, MD;  Location: [facility];  Service: Endoscopy;  Laterality: Bilateral;   VIDEO BRONCHOSCOPY N/A 01/24/2014   Procedure: VIDEO BRONCHOSCOPY;  Surgeon: Gaye Pollack, MD;  Location: [facility] OR;  Service: Thoracic;  Laterality: N/A;    Current Medications: Current Meds  Medication Sig   albuterol (VENTOLIN HFA) 108 (90 Base) MCG/ACT inhaler Inhale 2 puffs into the lungs every 6 (six) hours as needed for wheezing or shortness of breath.   allopurinol (ZYLOPRIM) 100 MG tablet Take 100 mg by mouth daily.   atorvastatin (LIPITOR) 40 MG tablet TAKE 1 TABLET BY MOUTH DAILY   clopidogrel (PLAVIX) 75 MG tablet Take 75 mg by mouth daily.   Continuous Blood Gluc Receiver (DEXCOM G6 RECEIVER) DEVI 1 each by Does not apply route 4 (four) times daily -  before meals and at bedtime.   Continuous Blood Gluc Sensor (DEXCOM G6 SENSOR) MISC Apply every 10 days   diltiazem (CARDIZEM CD) 120 MG 24 hr capsule Take 1 capsule (120 mg total) by mouth daily.   famotidine (PEPCID) 20 MG tablet Take 40 mg by mouth daily as needed for heartburn or indigestion.   Glucagon, rDNA, (GLUCAGON EMERGENCY) 1 MG KIT Inject 1 mg as directed as needed (low blood sugar).    hydrALAZINE (APRESOLINE) 100 MG tablet Take 100 mg by mouth in the morning, at  noon, and at bedtime.   hydrOXYzine (ATARAX/VISTARIL) 50 MG tablet Take 1 tablet (50 mg total) by mouth 3 (three) times daily as needed. (Patient taking differently: Take 50 mg by mouth 3 (three) times daily as needed for anxiety.)   Insulin Pen Needle (BD PEN NEEDLE NANO 2ND GEN) 32G X 4 MM MISC USE DAILY WITH BASAGLAR   isosorbide mononitrate (IMDUR) 120 MG 24 hr tablet TAKE 1 TABLET BY MOUTH ONCE DAILY IN THEMORNINGS   latanoprost (XALATAN) 0.005 % ophthalmic solution Place 1 drop into both eyes at bedtime.   metoprolol succinate (TOPROL-XL) 25 MG 24 hr tablet Take 0.5 tablets (12.5 mg total) by mouth daily. Take with or immediately following a meal.   mometasone-formoterol (DULERA) 200-5 MCG/ACT AERO Inhale 2 puffs into the lungs 2 (two) times daily as needed for wheezing.   nitroGLYCERIN (NITROSTAT) 0.4 MG SL tablet Place 0.4 mg under the tongue every 5 (five) minutes as needed for chest pain.    OXYGEN Inhale into the lungs. 3 L/min.   pantoprazole (PROTONIX) 40 MG tablet Take 1 tablet (40 mg total) by mouth in the morning.   potassium chloride SA (KLOR-CON) 20 MEQ tablet Take 20 mEq by mouth daily.   Rivaroxaban (XARELTO) 15 MG TABS tablet Take 1 tablet (15 mg total) by mouth at bedtime.   tamsulosin (FLOMAX) 0.4 MG CAPS capsule TAKE 2 CAPSULES BY MOUTH DAILY AFTER SUPPER   torsemide (DEMADEX) 20 MG tablet Take 20 mg by mouth daily.   vortioxetine HBr (TRINTELLIX) 10 MG TABS tablet Take 1 tablet (10 mg total) by mouth daily.     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 There is no history of Anesthesia problems, Hypotension, Malignant hyperthermia, or Pseudochol deficiency. ROS:   Please see the history of present illness.    All other systems reviewed and are negative.  EKGs/Labs/Other Studies Reviewed:    The following studies were reviewed today:   Recent Labs: 05/31/2021: TSH 3.560 06/17/2021: ALT 13; BUN 11; Creatinine, Ser 1.87; Hemoglobin 13.3; NT-Pro BNP 2,275; Platelets 320; Potassium 3.7; Sodium 141  Recent Lipid Panel    Component Value Date/Time   CHOL 209 (H) 05/31/2021 1043   TRIG 99 05/31/2021 1043   HDL 60 05/31/2021 1043   CHOLHDL 3.5 05/31/2021 1043   CHOLHDL 2.9 01/31/2014 0337   VLDL 14 01/31/2014 0337   LDLCALC 131 (H) 05/31/2021 1043    Physical Exam:    VS:  BP (!) 176/92   Pulse 68   Ht '5\' 7"'  (1.702 m)   Wt 212 lb 6.4 oz (96.3 kg)   SpO2 98%   BMI 33.27 kg/m     Wt Readings from Last 3 Encounters:  07/24/21 212 lb 6.4 oz (96.3 kg)  06/17/21 231 lb (104.8 kg)  05/31/21 244 lb (110.7 kg)     GEN: He looks very debilitated and frail he struggles to transfer and required assistance n no acute distress HEENT: Normal NECK: No JVD; No carotid bruits LYMPHATICS: No lymphadenopathy CARDIAC: Irregular rate and rhythm  no murmurs, rubs,  gallops RESPIRATORY:  Clear to auscultation without rales, wheezing or rhonchi  ABDOMEN: Soft, non-tender, non-distended MUSCULOSKELETAL:  No edema; No deformity  SKIN: Warm and dry NEUROLOGIC:  Alert and oriented x 3 PSYCHIATRIC:  Normal affect    Signed, Shirlee More, MD  07/24/2021 10:28 AM    [facility]

## 2021-07-24 ENCOUNTER — Ambulatory Visit: Payer: Medicare Other | Admitting: Cardiology

## 2021-07-24 ENCOUNTER — Other Ambulatory Visit: Payer: Self-pay

## 2021-07-24 ENCOUNTER — Other Ambulatory Visit: Payer: Self-pay | Admitting: Family Medicine

## 2021-07-24 ENCOUNTER — Encounter: Payer: Self-pay | Admitting: Cardiology

## 2021-07-24 VITALS — BP 176/92 | HR 68 | Ht 67.0 in | Wt 212.4 lb

## 2021-07-24 DIAGNOSIS — I110 Hypertensive heart disease with heart failure: Secondary | ICD-10-CM

## 2021-07-24 DIAGNOSIS — I482 Chronic atrial fibrillation, unspecified: Secondary | ICD-10-CM | POA: Diagnosis not present

## 2021-07-24 DIAGNOSIS — I951 Orthostatic hypotension: Secondary | ICD-10-CM

## 2021-07-24 DIAGNOSIS — E782 Mixed hyperlipidemia: Secondary | ICD-10-CM

## 2021-07-24 DIAGNOSIS — K529 Noninfective gastroenteritis and colitis, unspecified: Secondary | ICD-10-CM

## 2021-07-24 DIAGNOSIS — I5032 Chronic diastolic (congestive) heart failure: Secondary | ICD-10-CM

## 2021-07-24 DIAGNOSIS — R0602 Shortness of breath: Secondary | ICD-10-CM

## 2021-07-24 DIAGNOSIS — R296 Repeated falls: Secondary | ICD-10-CM

## 2021-07-24 NOTE — Patient Instructions (Addendum)
Medication Instructions:  Your physician has recommended you make the following change in your medication:  DECREASE: Iron to once every other day.   *If you need a refill on your cardiac medications before your next appointment, please call your pharmacy*   Lab Work: Your physician recommends that you return for lab work in: [place] If you have labs (blood work) drawn today and your tests are completely normal, you will receive your results only by: [place] (if you have [place]) OR A paper copy in the mail If you have any lab test that is abnormal or we need to change your treatment, we will call you to review the results.   Testing/Procedures: None   Follow-Up: At [facility], you and your health needs are our priority.  As part of our continuing mission to provide you with exceptional heart care, we have created designated Provider Care Teams.  These Care Teams include your primary Cardiologist (physician) and Advanced Practice Providers (APPs -  Physician Assistants and Nurse Practitioners) who all work together to provide you with the care you need, when you need it.  We recommend signing up for the patient portal called "MyChart".  Sign up information is provided on this After Visit Summary.  MyChart is used to connect with patients for Virtual Visits (Telemedicine).  Patients are able to view lab/test results, encounter notes, upcoming appointments, etc.  Non-urgent messages can be sent to your provider as well.   To learn more about what you can do with MyChart, go to [email or web address].    Your next appointment:   6 month(s)  The format for your next appointment:   In Person  Provider:   Shirlee More, MD    Other Instructions

## 2021-07-25 DIAGNOSIS — J9610 Chronic respiratory failure, unspecified whether with hypoxia or hypercapnia: Secondary | ICD-10-CM | POA: Diagnosis not present

## 2021-07-26 DIAGNOSIS — I5033 Acute on chronic diastolic (congestive) heart failure: Secondary | ICD-10-CM | POA: Diagnosis not present

## 2021-07-27 DIAGNOSIS — J9610 Chronic respiratory failure, unspecified whether with hypoxia or hypercapnia: Secondary | ICD-10-CM | POA: Diagnosis not present

## 2021-07-27 LAB — BASIC METABOLIC PANEL
BUN/Creatinine Ratio: 5 — ABNORMAL LOW (ref 10–24)
BUN: 8 mg/dL (ref 8–27)
CO2: 25 mmol/L (ref 20–29)
Calcium: 7.7 mg/dL — ABNORMAL LOW (ref 8.6–10.2)
Chloride: 102 mmol/L (ref 96–106)
Creatinine, Ser: 1.52 mg/dL — ABNORMAL HIGH (ref 0.76–1.27)
Glucose: 138 mg/dL — ABNORMAL HIGH (ref 70–99)
Potassium: 3.7 mmol/L (ref 3.5–5.2)
Sodium: 140 mmol/L (ref 134–144)
eGFR: 48 mL/min/{1.73_m2} — ABNORMAL LOW (ref >59)

## 2021-07-27 LAB — PRO B NATRIURETIC PEPTIDE: NT-Pro BNP: 3000 pg/mL — ABNORMAL HIGH (ref 0–376)

## 2021-07-29 ENCOUNTER — Ambulatory Visit: Payer: Medicare Other | Admitting: Family Medicine

## 2021-07-30 ENCOUNTER — Telehealth: Payer: Self-pay

## 2021-07-30 DIAGNOSIS — G4733 Obstructive sleep apnea (adult) (pediatric): Secondary | ICD-10-CM | POA: Diagnosis not present

## 2021-07-30 DIAGNOSIS — Z8673 Personal history of transient ischemic attack (TIA), and cerebral infarction without residual deficits: Secondary | ICD-10-CM | POA: Diagnosis not present

## 2021-07-30 DIAGNOSIS — E871 Hypo-osmolality and hyponatremia: Secondary | ICD-10-CM | POA: Diagnosis not present

## 2021-07-30 DIAGNOSIS — I252 Old myocardial infarction: Secondary | ICD-10-CM | POA: Diagnosis not present

## 2021-07-30 DIAGNOSIS — M1990 Unspecified osteoarthritis, unspecified site: Secondary | ICD-10-CM | POA: Diagnosis not present

## 2021-07-30 DIAGNOSIS — J449 Chronic obstructive pulmonary disease, unspecified: Secondary | ICD-10-CM | POA: Diagnosis not present

## 2021-07-30 DIAGNOSIS — N183 Chronic kidney disease, stage 3 unspecified: Secondary | ICD-10-CM | POA: Diagnosis not present

## 2021-07-30 DIAGNOSIS — E039 Hypothyroidism, unspecified: Secondary | ICD-10-CM | POA: Diagnosis not present

## 2021-07-30 DIAGNOSIS — Z7901 Long term (current) use of anticoagulants: Secondary | ICD-10-CM | POA: Diagnosis not present

## 2021-07-30 DIAGNOSIS — I482 Chronic atrial fibrillation, unspecified: Secondary | ICD-10-CM | POA: Diagnosis not present

## 2021-07-30 DIAGNOSIS — Z7902 Long term (current) use of antithrombotics/antiplatelets: Secondary | ICD-10-CM | POA: Diagnosis not present

## 2021-07-30 DIAGNOSIS — I5033 Acute on chronic diastolic (congestive) heart failure: Secondary | ICD-10-CM | POA: Diagnosis not present

## 2021-07-30 DIAGNOSIS — J9621 Acute and chronic respiratory failure with hypoxia: Secondary | ICD-10-CM | POA: Diagnosis not present

## 2021-07-30 DIAGNOSIS — K219 Gastro-esophageal reflux disease without esophagitis: Secondary | ICD-10-CM | POA: Diagnosis not present

## 2021-07-30 DIAGNOSIS — Z9981 Dependence on supplemental oxygen: Secondary | ICD-10-CM | POA: Diagnosis not present

## 2021-07-30 DIAGNOSIS — E1122 Type 2 diabetes mellitus with diabetic chronic kidney disease: Secondary | ICD-10-CM | POA: Diagnosis not present

## 2021-07-30 DIAGNOSIS — I130 Hypertensive heart and chronic kidney disease with heart failure and stage 1 through stage 4 chronic kidney disease, or unspecified chronic kidney disease: Secondary | ICD-10-CM | POA: Diagnosis not present

## 2021-07-30 NOTE — Telephone Encounter (Signed)
-----   Message from Richardo Priest, MD sent at 07/28/2021  3:31 PM EST ----- Normal or stable result  No changes

## 2021-07-30 NOTE — Telephone Encounter (Signed)
Left message on patients voicemail to please return our call.   

## 2021-07-31 ENCOUNTER — Ambulatory Visit: Payer: Medicare Other | Admitting: Family Medicine

## 2021-07-31 ENCOUNTER — Telehealth: Payer: Self-pay

## 2021-07-31 NOTE — Telephone Encounter (Signed)
-----   Message from Richardo Priest, MD sent at 07/28/2021  3:31 PM EST ----- Normal or stable result  No changes

## 2021-07-31 NOTE — Telephone Encounter (Signed)
Left message on patients voicemail to please return our call.   Letter mailed to patient

## 2021-09-03 ENCOUNTER — Inpatient Hospital Stay: Payer: Medicare Other | Admitting: Family Medicine

## 2021-09-06 DIAGNOSIS — I361 Nonrheumatic tricuspid (valve) insufficiency: Secondary | ICD-10-CM

## 2021-12-12 ENCOUNTER — Telehealth: Payer: Self-pay

## 2021-12-12 NOTE — Progress Notes (Signed)
? ? ?Chronic Care Management ?Pharmacy Assistant  ? ?Name: Jon [name]  MRN: [id number] DOB: [birth year] ? ? ?Reason for Encounter: General Adherence Call  ?  ?Recent office visits:  ?None ? ?Recent consult visits:  ?07/24/21 (Cardiology) Shirlee More MD. Seen for Atrial Fibrillation. No med changes. ? ?Hospital visits:  ?None ? ?Medications: ?Outpatient Encounter Medications as of 12/12/2021  ?Medication Sig  ? albuterol (VENTOLIN HFA) 108 (90 Base) MCG/ACT inhaler Inhale 2 puffs into the lungs every 6 (six) hours as needed for wheezing or shortness of breath.  ? allopurinol (ZYLOPRIM) 100 MG tablet Take 100 mg by mouth daily.  ? atorvastatin (LIPITOR) 40 MG tablet TAKE 1 TABLET BY MOUTH DAILY  ? Azelastine HCl 0.15 % SOLN USE 1 SPRAY IN EACH NOSTRIL TWICE DAILY (Patient not taking: Reported on 07/24/2021)  ? clopidogrel (PLAVIX) 75 MG tablet Take 75 mg by mouth daily.  ? Continuous Blood Gluc Receiver (DEXCOM G6 RECEIVER) DEVI 1 each by Does not apply route 4 (four) times daily -  before meals and at bedtime.  ? Continuous Blood Gluc Sensor (DEXCOM G6 SENSOR) MISC Apply every 10 days  ? diltiazem (CARDIZEM CD) 120 MG 24 hr capsule Take 1 capsule (120 mg total) by mouth daily.  ? famotidine (PEPCID) 20 MG tablet Take 40 mg by mouth daily as needed for heartburn or indigestion.  ? ferrous sulfate 325 (65 FE) MG tablet Take 325 mg by mouth every other day.  ? Glucagon, rDNA, (GLUCAGON EMERGENCY) 1 MG KIT Inject 1 mg as directed as needed (low blood sugar).   ? hydrALAZINE (APRESOLINE) 100 MG tablet Take 100 mg by mouth in the morning, at noon, and at bedtime.  ? hydrOXYzine (ATARAX/VISTARIL) 50 MG tablet Take 1 tablet (50 mg total) by mouth 3 (three) times daily as needed. (Patient taking differently: Take 50 mg by mouth 3 (three) times daily as needed for anxiety.)  ? Insulin Pen Needle (BD PEN NEEDLE NANO 2ND GEN) 32G X 4 MM MISC USE DAILY WITH BASAGLAR  ? isosorbide mononitrate (IMDUR) 120 MG 24 hr tablet TAKE 1  TABLET BY MOUTH ONCE DAILY IN THEMORNINGS  ? latanoprost (XALATAN) 0.005 % ophthalmic solution Place 1 drop into both eyes at bedtime.  ? metoprolol succinate (TOPROL-XL) 25 MG 24 hr tablet Take 0.5 tablets (12.5 mg total) by mouth daily. Take with or immediately following a meal.  ? mometasone-formoterol (DULERA) 200-5 MCG/ACT AERO Inhale 2 puffs into the lungs 2 (two) times daily as needed for wheezing.  ? nitroGLYCERIN (NITROSTAT) 0.4 MG SL tablet Place 0.4 mg under the tongue every 5 (five) minutes as needed for chest pain.   ? OXYGEN Inhale into the lungs. 3 L/min.  ? pantoprazole (PROTONIX) 40 MG tablet Take 1 tablet (40 mg total) by mouth in the morning.  ? potassium chloride SA (KLOR-CON) 20 MEQ tablet Take 20 mEq by mouth daily.  ? tamsulosin (FLOMAX) 0.4 MG CAPS capsule TAKE 2 CAPSULES BY MOUTH DAILY AFTER SUPPER  ? torsemide (DEMADEX) 20 MG tablet Take 20 mg by mouth daily.  ? Vitamin D, Ergocalciferol, (DRISDOL) 1.25 MG (50000 UNIT) CAPS capsule Take 1 capsule (50,000 Units total) by mouth once a week. (Patient not taking: Reported on 07/24/2021)  ? vortioxetine HBr (TRINTELLIX) 10 MG TABS tablet Take 1 tablet (10 mg total) by mouth daily.  ? XARELTO 15 MG TABS tablet TAKE 1 TABLET BY MOUTH AT BEDTIME  ? ?No facility-administered encounter medications on file as of 12/12/2021.  ? ? ?  [facility] for general disease state and medication adherence call.  ? ?Patient is > 5 days past due for refill on the following medications per chart history: ? ?[facility] Medications: ?Medication Name/mg Last Fill Days Supply ?Atorvastatin 74m  06/28/21 90ds ?    04/01/21  90ds ? ?Care Gaps: ?Last annual wellness visit?None noted  ?Last eye exam / retinopathy screening? 07/11/20 ?Diabetic foot exam?02/28/15 ? ?Unable to reach pt after several attempts  ? ? ?DElray Mcgregor CMA ?Clinical Pharmacist Assistant  ?[phone] ?

## 2021-12-19 ENCOUNTER — Telehealth: Payer: Self-pay

## 2021-12-19 NOTE — Telephone Encounter (Signed)
Tried to reach out to patient, unable to. VM full.  ?

## 2021-12-23 NOTE — Telephone Encounter (Signed)
Patient is currently in the Stay Well Program and is no longer under our care.  Thanks. ?

## 2022-06-25 IMAGING — DX DG FOOT 2V*R*
2 series · 2 of 2 positions shown · non-contrast
Comparison: Radiographs from 04/26/2020

CLINICAL DATA: Right toe amputation, postoperative

EXAM:
RIGHT FOOT - 2 VIEW

[foot ap]
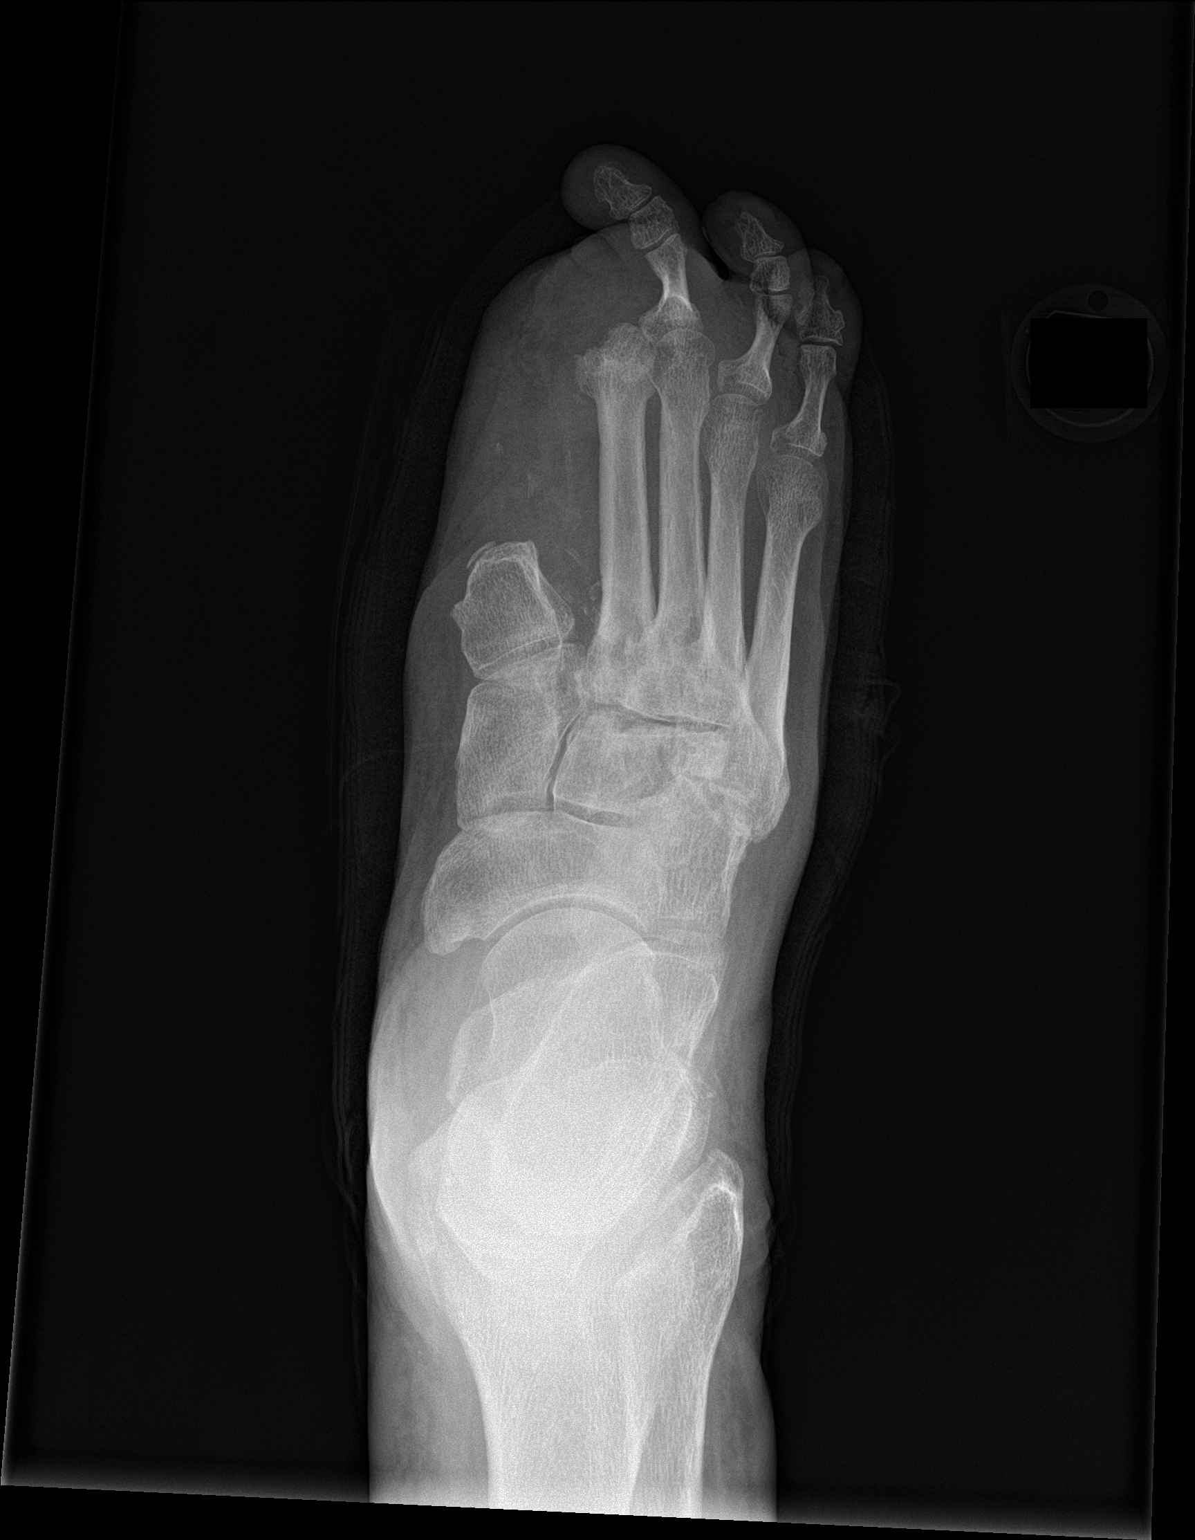

[foot lat]
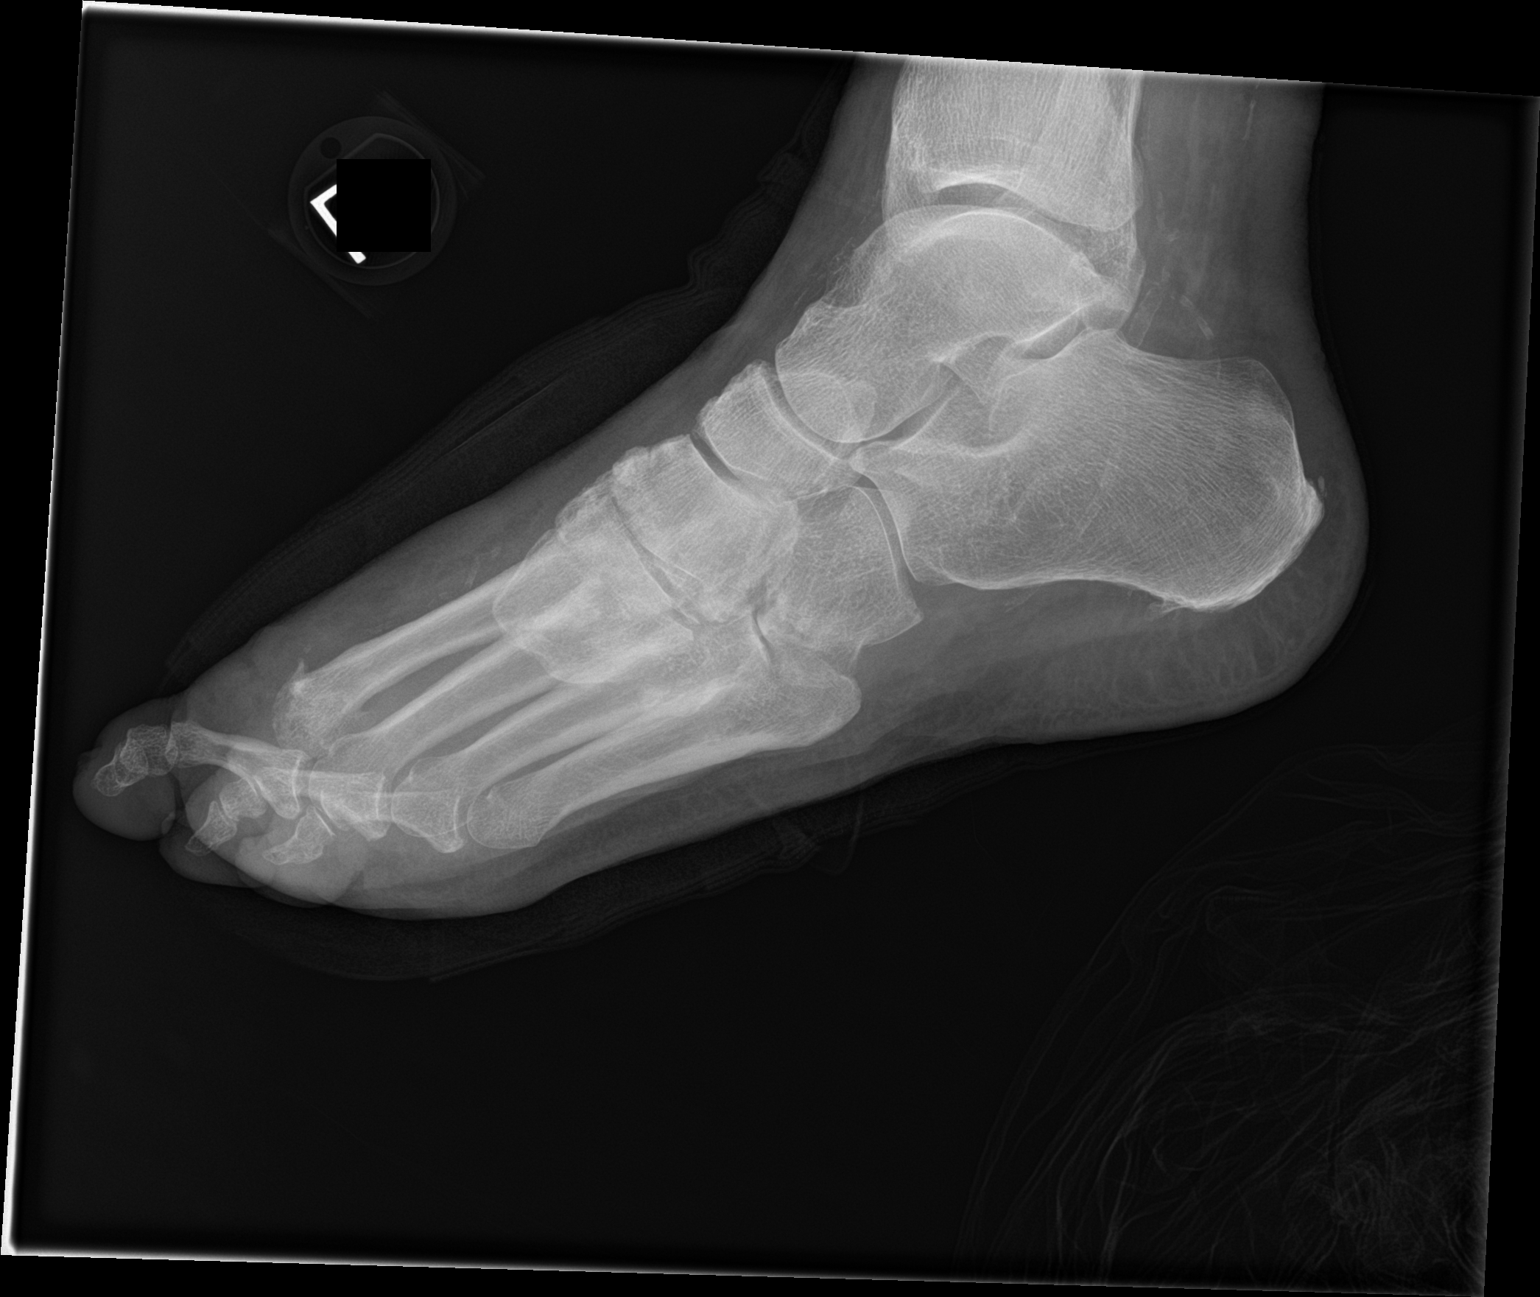

[2 of 2 positions shown; findings below may reference images not displayed]

FINDINGS: Interval amputation of the right second toe at the MTP joint. The
previous scalloped deformity of the head of the second metatarsal is
unchanged and could be a manifestation of osteomyelitis or erosion.
Adequate soft tissue coverage at the amputation site with indistinct
tissue planes in this vicinity likely primarily from edema.

Vascular calcifications are noted. There is soft tissue swelling
along the dorsum of the foot similar to the preoperative exam. No
unexpected foreign body.

Remote amputation of the first digit at the proximal metatarsal
metadiaphysis.

Small plantar and Achilles calcaneal spurs. Mild midfoot spurring
and some spurring along the Lisfranc joint.
IMPRESSION: 1. Interval amputation of the right second toe at the MTP joint.
2. Stable appearance of the scalloped deformity of the head of the
second metatarsal. This does not have a sclerotic margin, and
accordingly this could be from an erosion or from osteomyelitis of
the head of the second metatarsal.
3. Soft tissue swelling along the dorsum of the foot.

## 2023-06-30 ENCOUNTER — Other Ambulatory Visit: Payer: Self-pay | Admitting: *Deleted

## 2023-06-30 DIAGNOSIS — I739 Peripheral vascular disease, unspecified: Secondary | ICD-10-CM

## 2023-07-08 ENCOUNTER — Ambulatory Visit (HOSPITAL_COMMUNITY)
Admission: RE | Admit: 2023-07-08 | Discharge: 2023-07-08 | Disposition: A | Discharge status: Home / Self Care | Payer: No Typology Code available for payment source | Source: Ambulatory Visit | Attending: Vascular Surgery | Admitting: Vascular Surgery

## 2023-07-08 ENCOUNTER — Encounter: Payer: Self-pay | Admitting: Vascular Surgery

## 2023-07-08 ENCOUNTER — Ambulatory Visit (INDEPENDENT_AMBULATORY_CARE_PROVIDER_SITE_OTHER): Payer: No Typology Code available for payment source | Admitting: Vascular Surgery

## 2023-07-08 VITALS — BP 151/96 | HR 77 | Temp 98.2°F | Resp 20 | Ht 67.0 in | Wt 189.6 lb

## 2023-07-08 DIAGNOSIS — I739 Peripheral vascular disease, unspecified: Secondary | ICD-10-CM

## 2023-07-08 DIAGNOSIS — L97519 Non-pressure chronic ulcer of other part of right foot with unspecified severity: Secondary | ICD-10-CM

## 2023-07-08 LAB — VAS US ABI WITH/WO TBI: Right ABI: 1.2

## 2023-07-08 NOTE — Progress Notes (Signed)
Patient ID: Jon [name], male   DOB: [birth year], 72 y.o.   MRN: [id number]  Reason for Consult: New Patient (Initial Visit)   Referred by Ricky Stabs, N*  Subjective:     HPI:  Jon [name] is a 72 y.o. male with history of right anterior tibial and posterior tibial artery angioplasty to heal right great toe gangrene.  He ultimately had right first and second toe amputations.  He now has ulceration of the right third toe he is undergoing wound care.  He is mostly nonambulatory at this time he is able to transfer.  He is will stay well on Mondays, Wednesdays and Fridays which is an adult daycare.  He is on Xarelto and also home oxygen.  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   OLBx 01/20/14 BOOP Started steroids 02/28/14    CHF (congestive heart failure) (HCC)    Chronic anticoagulation-Xarelto 02/09/2014   Chronic atrial fibrillation (HCC) 01/03/2014   Chronic bronchitis (HCC) 12/21/2013   Chronic diastolic heart failure (HCC) 02/22/2015   Chronic pain of both knees 03/18/2017   Overview:  Added automatically from request for surgery [id number]   Chronic respiratory failure (HCC) 04/10/2015   Chronic respiratory failure with hypoxia (HCC) 02/11/2020   Closed fracture of multiple carpal bones 03/07/2020   COPD (chronic obstructive pulmonary disease) (HCC) 01/25/2015   Cough    coughing up blood- started last nite, seems to be worse now   CVA (cerebral vascular accident) (HCC) 02/09/2014   Diabetic polyneuropathy associated with type 2  diabetes mellitus (HCC) 12/21/2013   Diabetic ulcer of toe of right foot associated with type 2 diabetes mellitus, with necrosis of bone (HCC)    Diabetic ulcer of toe of right foot associated with type 2 diabetes mellitus, with necrosis of bone (HCC)    DM (diabetes mellitus), 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/01/2018   Drug induced constipation 11/10/2017   Dysrhythmia    atrial fib takes cardizem,    Fibromyalgia    GERD (gastroesophageal reflux disease)    H/O hiatal hernia    Hemoptysis 08/01/2015   Herpes zoster 06/26/2015   History of stroke    Hyperkalemia-repeat pending 11/29/2014   Hyperlipemia    Hyperlipidemia    Hypertension    Hypertensive heart disease with heart failure (HCC)    Hypothyroidism    LBBB (left bundle branch block) 02/22/2015   Long term (current) use of anticoagulants 03/21/2014   Mild CAD 02/20/2015   Overview:  On recent cardiac cath  Overview:  On recent cardiac cath   Myocardial infarction [facility])    "one dr says yes; another says no"   Myocardial infarction [facility])    "one dr says yes; another says no"   Neuromuscular disorder (HCC)    rls, neuropathy   Neuropathy    rls, neuropathy    Normal coronary arteries 2011 11/29/2014   Obesity (BMI 30-39.9)    On home oxygen therapy    "2L w/CPAP at bedtime" (  09/01/2018)   OSA on CPAP    cpap & oxygen   Pericardial effusion    Peripheral vascular disease (HCC)    Peritoneal effusion, chronic 02/22/2015   Overview:  With surgical window   PNA (pneumonia) 04/10/2015   Pneumonia 5/15   "several times" (01/18/2014)   Precordial pain 01/26/2015   Preoperative cardiovascular examination 02/24/2016   Primary osteoarthritis of both knees 03/18/2017   Overview:  Added automatically from request for surgery [id number]   PVD (peripheral vascular disease) (HCC) 09/28/2018   Restless leg syndrome    Sepsis (HCC) 06/16/2021   Sleep apnea    cpap & oxygen    Stroke (HCC) 2012   denies residual on 01/18/2014   Troponin  level elevated 11/29/2014   Ulcers of both lower legs (HCC) 12/17/2019   Uncomplicated opioid dependence (HCC) 12/17/2019   Uncontrolled type 2 diabetes mellitus with diabetic polyneuropathy, with long-term current use of insulin 12/21/2013   type ?    Wound of buttock 06/16/2021   Family History  Problem Relation Age of Onset   Cancer Mother    Stroke Father    Heart disease Father    Seizures Sister    Diabetes Sister    Anesthesia problems Neg Hx    Hypotension Neg Hx    Malignant hyperthermia Neg Hx    Pseudochol deficiency Neg Hx    Past Surgical History:  Procedure Laterality Date   ABDOMINAL AORTOGRAM W/LOWER EXTREMITY N/A 09/08/2018   Procedure: ABDOMINAL AORTOGRAM W/LOWER EXTREMITY;  Surgeon: Cephus Shelling, MD;  Location: [facility] INVASIVE CV LAB;  Service: Cardiovascular;  Laterality: N/A;   AMPUTATION Right 09/09/2018   Procedure: AMPUTATION RIGHT GREAT TOE;  Surgeon: Maeola Harman, MD;  Location: [facility] OR;  Service: Vascular;  Laterality: Right;   AMPUTATION TOE Right 05/04/2020   Procedure: AMPUTATION TOE RIGHT 2nd TOE;  Surgeon: Park Liter, DPM;  Location: [facility] ORS;  Service: Podiatry;  Laterality: Right;   APPENDECTOMY     CARDIAC CATHETERIZATION  X 2 then 03/03/2012    NL LVF, normal coronaries, vessels are small ([facility]: Dr. Desma Maxim)   CATARACT EXTRACTION W/ INTRAOCULAR LENS  IMPLANT, BILATERAL Bilateral    CHOLECYSTECTOMY     HERNIA REPAIR     UHR   LAPAROSCOPIC GASTRIC BANDING  2010   LOWER EXTREMITY ANGIOGRAPHY Left 10/11/2018   Procedure: LOWER EXTREMITY ANGIOGRAPHY;  Surgeon: Maeola Harman, MD;  Location: [facility] INVASIVE CV LAB;  Service: Cardiovascular;  Laterality: Left;   PERICARDIAL WINDOW Left 01/24/2014   Procedure: PERICARDIAL WINDOW;  Surgeon: Alleen Borne, MD;  Location: [facility] OR;  Service: Thoracic;  Laterality: Left;   PERIPHERAL VASCULAR INTERVENTION Right 09/08/2018   Procedure: PERIPHERAL VASCULAR INTERVENTION;  Surgeon: Cephus Shelling, MD;  Location: [facility] INVASIVE CV LAB;  Service: Cardiovascular;  Laterality: Right;   SINUS EXPLORATION  X 2   TEE WITHOUT CARDIOVERSION N/A 09/07/2018   Procedure: TRANSESOPHAGEAL ECHOCARDIOGRAM (TEE);  Surgeon: Elease Hashimoto Deloris Ping, MD;  Location: [facility] ENDOSCOPY;  Service: Cardiovascular;  Laterality: N/A;   UMBILICAL HERNIA REPAIR     VIDEO ASSISTED THORACOSCOPY Left 01/24/2014   Procedure: VIDEO ASSISTED THORACOSCOPY;  Surgeon: Alleen Borne, MD;  Location: [facility] OR;  Service: Thoracic;  Laterality: Left;  VATS/open lung biopsy   VIDEO BRONCHOSCOPY Bilateral 12/29/2013   Procedure: VIDEO BRONCHOSCOPY WITHOUT FLUORO;  Surgeon: Storm Frisk, MD;  Location: [facility] ENDOSCOPY;  Service: Endoscopy;  Laterality: Bilateral;   VIDEO BRONCHOSCOPY N/A 01/24/2014   Procedure:  VIDEO BRONCHOSCOPY;  Surgeon: Alleen Borne, MD;  Location: [facility] OR;  Service: Thoracic;  Laterality: N/A;    Short Social History:  Social History   Tobacco Use   Smoking status: Former    Types: Cigars    Quit date: 09/01/2006    Years since quitting: 16.8   Smokeless tobacco: Never  Substance Use Topics   Alcohol use: No    Comment: "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 No Sudden or severe rash/hives, skin peeling, or the inside of the mouth or nose? No Did it require medical treatment? No When did it last happen?      unknown If all above answers are "NO", may proceed with cephalosporin use.     Current Outpatient Medications  Medication Sig Dispense Refill   albuterol (VENTOLIN HFA) 108 (90 Base) MCG/ACT inhaler Inhale 2 puffs into the lungs every 6 (six) hours as needed for wheezing or shortness of breath. 8 g 2   allopurinol (ZYLOPRIM) 100 MG tablet  Take 100 mg by mouth daily.     amLODipine (NORVASC) 5 MG tablet Take 5 mg by mouth daily.     apixaban (ELIQUIS) 2.5 MG TABS tablet Take 2.5 mg by mouth 2 (two) times daily.     atorvastatin (LIPITOR) 40 MG tablet TAKE 1 TABLET BY MOUTH DAILY 90 tablet 1   Azelastine HCl 0.15 % SOLN USE 1 SPRAY IN EACH NOSTRIL TWICE DAILY 30 mL 11   Continuous Blood Gluc Receiver (DEXCOM G6 RECEIVER) DEVI 1 each by Does not apply route 4 (four) times daily -  before meals and at bedtime. 1 each 0   Continuous Blood Gluc Sensor (DEXCOM G6 SENSOR) MISC Apply every 10 days 3 each 3   diltiazem (CARDIZEM CD) 120 MG 24 hr capsule Take 1 capsule (120 mg total) by mouth daily. 90 capsule 1   famotidine (PEPCID) 20 MG tablet Take 40 mg by mouth daily as needed for heartburn or indigestion.     ferrous sulfate 325 (65 FE) MG tablet Take 325 mg by mouth every other day.     Glucagon, rDNA, (GLUCAGON EMERGENCY) 1 MG KIT Inject 1 mg as directed as needed (low blood sugar).      hydrALAZINE (APRESOLINE) 100 MG tablet Take 100 mg by mouth in the morning, at noon, and at bedtime.     hydrOXYzine (ATARAX/VISTARIL) 50 MG tablet Take 1 tablet (50 mg total) by mouth 3 (three) times daily as needed. (Patient taking differently: Take 50 mg by mouth 3 (three) times daily as needed for anxiety.) 270 tablet 1   Insulin Pen Needle (BD PEN NEEDLE NANO 2ND GEN) 32G X 4 MM MISC USE DAILY WITH BASAGLAR 100 each 3   isosorbide mononitrate (IMDUR) 120 MG 24 hr tablet TAKE 1 TABLET BY MOUTH ONCE DAILY IN THEMORNINGS 90 tablet 1   latanoprost (XALATAN) 0.005 % ophthalmic solution Place 1 drop into both eyes at bedtime.     linezolid (ZYVOX) 600 MG tablet Take 600 mg by mouth 2 (two) times daily.     LOKELMA 5 g packet Take 1 packet by mouth daily.     metoprolol succinate (TOPROL-XL) 25 MG 24  hr tablet Take 0.5 tablets (12.5 mg total) by mouth daily. Take with or immediately following a meal. 15 tablet 2   mometasone-formoterol (DULERA) 200-5  MCG/ACT AERO Inhale 2 puffs into the lungs 2 (two) times daily as needed for wheezing.     nitroGLYCERIN (NITROSTAT) 0.4 MG SL tablet Place 0.4 mg under the tongue every 5 (five) minutes as needed for chest pain.      OXYGEN Inhale into the lungs. 3 L/min.     pantoprazole (PROTONIX) 40 MG tablet Take 1 tablet (40 mg total) by mouth in the morning. 90 tablet 3   potassium chloride SA (KLOR-CON) 20 MEQ tablet Take 20 mEq by mouth daily.     sevelamer (RENAGEL) 800 MG tablet Take 800 mg by mouth 3 (three) times daily.     sodium bicarbonate 650 MG tablet Take 1,300 mg by mouth 2 (two) times daily.     tamsulosin (FLOMAX) 0.4 MG CAPS capsule TAKE 2 CAPSULES BY MOUTH DAILY AFTER SUPPER 180 capsule 1   torsemide (DEMADEX) 20 MG tablet Take 20 mg by mouth daily.     Vitamin D, Ergocalciferol, (DRISDOL) 1.25 MG (50000 UNIT) CAPS capsule Take 1 capsule (50,000 Units total) by mouth once a week. 12 capsule 1   vortioxetine HBr (TRINTELLIX) 10 MG TABS tablet Take 1 tablet (10 mg total) by mouth daily. 30 tablet 2   clopidogrel (PLAVIX) 75 MG tablet Take 75 mg by mouth daily. (Patient not taking: Reported on 07/08/2023)     No current facility-administered medications for this visit.    Review of Systems  Constitutional:  Constitutional negative. HENT: HENT negative.  Eyes: Eyes negative.  Respiratory: Respiratory negative.  Cardiovascular: Cardiovascular negative.  GI: Gastrointestinal negative.  Musculoskeletal:       Toe pain Skin: Positive for wound.  Neurological: Neurological negative. Hematologic: Hematologic/lymphatic negative.  Psychiatric: Psychiatric negative.        Objective:  Objective   Vitals:   07/08/23 1140  BP: (!) 151/96  Pulse: 77  Resp: 20  Temp: 98.2 F (36.8 C)  SpO2: 98%  Weight: 189 lb 9.6 oz (86 kg)  Height: 5\' 7"  (1.702 m)   Body mass index is 29.7 kg/m.  Physical Exam HENT:     Head: Normocephalic.     Mouth/Throat:     Mouth: Mucous membranes are  moist.  Eyes:     Pupils: Pupils are equal, round, and reactive to light.  Cardiovascular:     Pulses:          Dorsalis pedis pulses are detected w/ Doppler on the right side.       Posterior tibial pulses are 2+ on the right side.  Pulmonary:     Effort: Pulmonary effort is normal.  Abdominal:     General: Abdomen is flat.     Palpations: Abdomen is soft.  Musculoskeletal:     Comments: Abnormally medially directed right third toe with ulceration  Skin:    Capillary Refill: Capillary refill takes less than 2 seconds.  Neurological:     General: No focal deficit present.     Mental Status: He is alert.  Psychiatric:        Mood and Affect: Mood normal.     Data: ABI Findings:  +---------+------------------+-----+-----------+--------+  Right   Rt Pressure (mmHg)IndexWaveform   Comment   +---------+------------------+-----+-----------+--------+  Brachial 188                                         +---------+------------------+-----+-----------+--------+  PTA     225               1.20 multiphasic          +---------+------------------+-----+-----------+--------+  DP      134               0.71 multiphasic          +---------+------------------+-----+-----------+--------+  Great Toe                                  AMP       +---------+------------------+-----+-----------+--------+   +---------+------------------+-----+---------+-------+  Left    Lt Pressure (mmHg)IndexWaveform Comment  +---------+------------------+-----+---------+-------+  Brachial 184                                      +---------+------------------+-----+---------+-------+  PTA     255               1.36 triphasic         +---------+------------------+-----+---------+-------+  DP      255               1.36 triphasic         +---------+------------------+-----+---------+-------+  Great Toe176               0.94                    +---------+------------------+-----+---------+-------+   +-------+-----------+-----------+------------+------------+  ABI/TBIToday's ABIToday's TBIPrevious ABIPrevious TBI  +-------+-----------+-----------+------------+------------+  Right 1.20       amp                                  +-------+-----------+-----------+------------+------------+  Left  Grass Range         0.94                                 +-------+-----------+-----------+------------+------------+      Arterial wall calcification precludes accurate ankle pressures and ABIs.    Summary:  Right: Resting right ankle-brachial index is within normal range.   Left: Resting left ankle-brachial index indicates noncompressible left  lower extremity arteries. The left toe-brachial index is normal.      Assessment/Plan:    72 year old male with history of right first and second toe amputations now with a deformed right third toe with ulceration that causes pain he has not had any fevers.  We have discussed that he has a palpable posterior tibial pulse and does not need any revascularization patient is having significant pain and requiring wound care and is requesting toe amputation.  We will need to hold Xarelto for 2 days prior and possibly admit for overnight observation but will plan for third to amputation in the near future.     Maeola Harman MD [facility]

## 2023-07-13 ENCOUNTER — Other Ambulatory Visit: Payer: Self-pay

## 2023-07-13 ENCOUNTER — Telehealth: Payer: Self-pay

## 2023-07-13 DIAGNOSIS — L97519 Non-pressure chronic ulcer of other part of right foot with unspecified severity: Secondary | ICD-10-CM

## 2023-07-13 DIAGNOSIS — I739 Peripheral vascular disease, unspecified: Secondary | ICD-10-CM

## 2023-07-13 NOTE — Telephone Encounter (Addendum)
Spoke with patient and daughter Jon [name] to schedule right third toe amputation on 11/19. Instructions provided and both verbalized understanding. Transportation will be provided by [employer]. Spoke with Vickie at [facility] who stated the office is currently closed. Left contact information to return call back to schedule surgery.

## 2023-07-14 NOTE — Telephone Encounter (Addendum)
Received a call from Lawson Fiscal at [facility] requesting to cancel patient's right third toe amputation surgery for now per Carole Binning, NP. States per office visit note, it appears the patient is the one requesting surgery and she would like to speak with Dr. Randie Heinz to gain more insight on the need for amputation. States, they would also need more time to arrange transportation. Message sent to Dr. Randie Heinz to contact provider to discuss further.

## 2023-07-21 ENCOUNTER — Encounter (HOSPITAL_COMMUNITY): Admission: RE | Payer: Self-pay | Source: Home / Self Care

## 2023-07-21 ENCOUNTER — Ambulatory Visit (HOSPITAL_COMMUNITY)
Admission: RE | Admit: 2023-07-21 | Payer: No Typology Code available for payment source | Source: Home / Self Care | Admitting: Vascular Surgery

## 2023-07-21 SURGERY — AMPUTATION DIGIT
Anesthesia: Choice | Laterality: Right

## 2023-07-28 NOTE — Telephone Encounter (Signed)
Per Dr. Randie Heinz, "attempted to call last week to no avail."

## 2023-08-25 LAB — LAB REPORT - SCANNED
Creatinine, POC: 53.3 mg/dL
EGFR: 43

## 2023-09-22 ENCOUNTER — Telehealth: Payer: Self-pay

## 2023-09-22 DIAGNOSIS — I4891 Unspecified atrial fibrillation: Secondary | ICD-10-CM | POA: Diagnosis not present

## 2023-09-22 NOTE — Telephone Encounter (Signed)
Called pt to f/u on his surgery order for right third toe amputation. Pt's step daughter Kendal Hymen stated this toe has been healing and she does not think he is needing amputation. He is followed by Dr. Maisie Fus at [facility]. They will call us for an appt in the future, should anything change.

## 2023-10-08 ENCOUNTER — Telehealth: Payer: Self-pay

## 2023-10-08 ENCOUNTER — Encounter: Payer: Self-pay | Admitting: *Deleted

## 2023-10-08 ENCOUNTER — Encounter: Payer: Self-pay | Admitting: Cardiology

## 2023-10-08 ENCOUNTER — Telehealth: Payer: Self-pay | Admitting: *Deleted

## 2023-10-08 NOTE — Telephone Encounter (Signed)
   Pre-operative Risk Assessment    Patient Name: Jon [name]  DOB: 08-Aug-1951 MRN: [id number]   Date of last office visit: 07/23/2022 Monetta, MD Date of next office visit: 10/16/23 Carlin, NP   Request for Surgical Clearance    Procedure:   Laparoscopic colon resection for colon cancer  Date of Surgery:  Clearance 11/23/23                                 Surgeon:  Dr. Lynwood Search Surgeon's Group or Practice Name:  [facility] Phone number:  [phone] Fax number:  [phone]   Type of Clearance Requested:   - Medical  - Pharmacy:  Hold Clopidogrel  (Plavix ) and Apixaban (Eliquis) not indicated   Type of Anesthesia:  General    Additional requests/questions:  Please include: - Most recent labs -Most recent office note - Chest xray results if performed at your office -Most recent EKG, stress test and echocardiogram -Pacemaker information and last evaluation  Signed, Ted Muskrat   10/08/2023, 4:51 PM

## 2023-10-08 NOTE — Telephone Encounter (Signed)
   [facility] Pre-operative Risk Assessment    Patient Name: Jon [name]  DOB: 10-31-1950 MRN: [id number]  HEARTCARE STAFF:  - IMPORTANT!!!!!! Under Visit Info/Reason for Call, type in Other and utilize the format Clearance MM/DD/YY or Clearance TBD. Do not use dashes or single digits. - Please review there is not already an duplicate clearance open for this procedure. - If request is for dental extraction, please clarify the # of teeth to be extracted. - If the patient is currently at the dentist's office, call Pre-Op Callback Staff (MA/nurse) to input urgent request.  - If the patient is not currently in the dentist office, please route to the Pre-Op pool.  Request for surgical clearance:  What type of surgery is being performed? Laparoscopic colon resection for colon cancer  When is this surgery scheduled? 11/23/23  What type of clearance is required (medical clearance vs. Pharmacy clearance to hold med vs. Both)? Both  Are there any medications that need to be held prior to surgery and how long? Eliquis   Practice name and name of physician performing surgery? [facility], Dr Lynwood Search  What is the office phone number? [phone]   7.   What is the office fax number? [phone]  8.   Anesthesia type (None, local, MAC, general) ? General   Georgeanna Radziewicz L 10/08/2023, 11:55 AM  _________________________________________________________________   (provider comments below)

## 2023-10-11 NOTE — Progress Notes (Addendum)
 Cardiology Office Note:  .   Date:  10/12/2023  ID:  Jon [name], DOB 11-10-1950, MRN [id number] PCP: Patient, No Pcp Per  [facility] Providers Cardiologist:  Norman Herrlich, MD    History of Present Illness: .   [name] is a 73 y.o. male with a past medical history of permanent atrial fibrillation, recurrent strokes, hypertension, nonobstructive CAD per left heart cath in 2016, NSVT, PVD, COPD, OSA, GERD, DM 2, CKD stage IV.  09/05/2021 echo EF 50 to 55%, borderline concentric LVH, mild aortic valve sclerosis, trace to mild MR 09/06/2020 echo  EF 55 to 60%, severe concentric LVH, mild aortic valve sclerosis without stenosis 01/19/2015 cardiac cath mild nonobstructive CAD  Most recently he was evaluated by Dr. Dulce Sellar on 07/23/2022, he apparently had continued to decline with his health, was frail and had frequent falls.  He was hypertensive at this visit however had significant orthostatic blood pressure changes.  He was advised to follow-up in 6 months.  He presents today for preoperative evaluation for an upcoming laparoscopic colon resection.  He is not accompanied by anybody, participates with [facility] and they dropped him off for his visit today.  He is wheelchair-bound and on oxygen.  He is a poor historian.  He lives at home with his stepdaughter, participates with [facility] 3 days/week.  He does not have any specific complaints today, has not felt well for the last year. He denies chest pain, palpitations, dyspnea, pnd, orthopnea, n, v, dizziness, syncope,  weight gain, or early satiety.     ROS: Review of Systems  Constitutional:  Positive for malaise/fatigue.  Respiratory:  Positive for shortness of breath.   All other systems reviewed and are negative.    Studies Reviewed: Marland Kitchen   EKG Interpretation Date/Time:  Monday October 12 2023 11:43:03 EST Ventricular Rate:  84 PR Interval:    QRS Duration:  122 QT Interval:  410 QTC Calculation: 484 R  Axis:   116  Text Interpretation: Atrial fibrillation Right axis deviation Minimal voltage criteria for LVH, may be normal variant ( Cornell product ) Anteroseptal infarct (cited on or before 01-May-2020) ST & T wave abnormality, consider inferior ischemia When compared with ECG of 01-May-2020 13:45, QRS duration has decreased T wave inversion now evident in Inferior leads Confirmed by Wallis Bamberg [phone]) on 10/12/2023 11:43:59 AM    Cardiac Studies & Procedures      ECHOCARDIOGRAM  ECHOCARDIOGRAM COMPLETE 09/06/2020  Narrative ECHOCARDIOGRAM REPORT    Patient Name:   [name] Date of Exam: 09/06/2020 Medical Rec #:  [id number]       Height:       67.0 in Accession #:    [id number]      Weight:       253.0 lb Date of Birth:  [birth year]       BSA:          2.235 m Patient Age:    69 years        BP:           130/68 mmHg Patient Gender: M               HR:           82 bpm. Exam Location:  [place]  Procedure: 2D Echo  Indications:    Hypertensive heart disease with heart failure (HCC) [I11.0 (ICD-10-CM)]  History:        Patient has prior history of Echocardiogram examinations, most  recent 01/31/2014. CHF, CAD and Previous Myocardial Infarction, Stroke and PAD, Arrythmias:LBBB and Atrial Fibrillation; Risk Factors:Diabetes and Dyslipidemia.  Sonographer:    Louie Boston Referring Phys: [phone] Baldo Daub   Sonographer Comments: Suboptimal apical window. IMPRESSIONS   1. Left ventricular ejection fraction, by estimation, is 55 to 60%. The left ventricle has normal function. The left ventricle has no regional wall motion abnormalities. There is severe concentric left ventricular hypertrophy. Left ventricular diastolic parameters are indeterminate. 2. Right ventricular systolic function is normal. The right ventricular size is mildly enlarged. There is normal pulmonary artery systolic pressure. 3. The mitral valve is normal in structure. No evidence of mitral valve  regurgitation. No evidence of mitral stenosis. 4. The aortic valve is normal in structure. Aortic valve regurgitation is not visualized. Mild aortic valve sclerosis is present, with no evidence of aortic valve stenosis. 5. The inferior vena cava is normal in size with greater than 50% respiratory variability, suggesting right atrial pressure of 3 mmHg.  FINDINGS Left Ventricle: Left ventricular ejection fraction, by estimation, is 55 to 60%. The left ventricle has normal function. The left ventricle has no regional wall motion abnormalities. Definity contrast agent was given IV to delineate the left ventricular endocardial borders. The left ventricular internal cavity size was normal in size. There is severe concentric left ventricular hypertrophy. Left ventricular diastolic parameters are indeterminate.  Right Ventricle: Prominent moderator band noted. The right ventricular size is mildly enlarged. No increase in right ventricular wall thickness. Right ventricular systolic function is normal. There is normal pulmonary artery systolic pressure. The tricuspid regurgitant velocity is 2.74 m/s, and with an assumed right atrial pressure of 3 mmHg, the estimated right ventricular systolic pressure is 33.0 mmHg.  Left Atrium: Left atrial size was normal in size.  Right Atrium: Right atrial size was normal in size.  Pericardium: There is no evidence of pericardial effusion. Presence of pericardial fat pad.  Mitral Valve: The mitral valve is normal in structure. Mild mitral annular calcification. No evidence of mitral valve regurgitation. No evidence of mitral valve stenosis.  Tricuspid Valve: The tricuspid valve is normal in structure. Tricuspid valve regurgitation is trivial. No evidence of tricuspid stenosis.  Aortic Valve: The aortic valve is normal in structure. There is mild aortic valve annular calcification. Aortic valve regurgitation is not visualized. Mild aortic valve sclerosis is present,  with no evidence of aortic valve stenosis.  Pulmonic Valve: The pulmonic valve was normal in structure. Pulmonic valve regurgitation is not visualized. No evidence of pulmonic stenosis.  Aorta: The aortic root is normal in size and structure.  Venous: The inferior vena cava is normal in size with greater than 50% respiratory variability, suggesting right atrial pressure of 3 mmHg.  IAS/Shunts: No atrial level shunt detected by color flow Doppler.   LEFT VENTRICLE PLAX 2D LVIDd:         3.90 cm  Diastology LVIDs:         2.45 cm  LV e' medial:    5.08 cm/s LV PW:         1.70 cm  LV E/e' medial:  24.2 LV IVS:        1.70 cm  LV e' lateral:   6.53 cm/s LVOT diam:     2.00 cm  LV E/e' lateral: 18.8 LV SV:         64 LV SV Index:   29 LVOT Area:     3.14 cm   RIGHT VENTRICLE  IVC RV S prime:     5.57 cm/s  IVC diam: 1.50 cm TAPSE (M-mode): 1.8 cm  LEFT ATRIUM             Index       RIGHT ATRIUM           Index LA diam:        4.80 cm 2.15 cm/m  RA Area:     20.80 cm LA Vol (A2C):   51.3 ml 22.96 ml/m RA Volume:   54.70 ml  24.48 ml/m LA Vol (A4C):   75.2 ml 33.65 ml/m LA Biplane Vol: 62.4 ml 27.92 ml/m AORTIC VALVE LVOT Vmax:   90.40 cm/s LVOT Vmean:  66.000 cm/s LVOT VTI:    0.204 m  AORTA Ao Root diam: 3.70 cm Ao Asc diam:  3.60 cm  MITRAL VALVE                TRICUSPID VALVE MV Area (PHT): 5.27 cm     TR Peak grad:   30.0 mmHg MV Decel Time: 144 msec     TR Vmax:        274.00 cm/s MV E velocity: 123.00 cm/s SHUNTS Systemic VTI:  0.20 m Systemic Diam: 2.00 cm  Kardie Tobb DO Electronically signed by Thomasene Ripple DO Signature Date/Time: 09/06/2020/1:06:52 PM    Final  TEE  ECHO TEE 09/07/2018  Narrative *[place]* *[facility]* [address] [place], [place] [address] [phone]  ------------------------------------------------------------------- Transesophageal Echocardiography  Patient:    Newel, Oien MR #:        [id number] Study Date: 09/07/2018 Gender:     M Age:        41 Height:     170.2 cm Weight:     106.8 kg BSA:        2.29 m^2 Pt. Status: Room:       5W31C  PERFORMING   Kristeen Miss, M.D. ORDERING     Kilroy, Luke K REFERRING    Corine Shelter K SONOGRAPHER  Perley Jain, RDCS ATTENDING    Elgergawy, Mauricio Po, Ramesh  cc:  ------------------------------------------------------------------- LV EF: 55% -   60%  ------------------------------------------------------------------- Indications:      Bacteremia 790.7.  ------------------------------------------------------------------- Study Conclusions  - Left ventricle: Systolic function was normal. The estimated ejection fraction was in the range of 55% to 60%. - Aortic valve: No evidence of vegetation. There was mild regurgitation. - Mitral valve: No evidence of vegetation. There was mild to moderate regurgitation. - Left atrium: No evidence of thrombus in the atrial cavity or appendage. - Right atrium: No evidence of thrombus in the atrial cavity or appendage. - Tricuspid valve: No evidence of vegetation. There was moderate regurgitation. - Pulmonic valve: No evidence of vegetation.  ------------------------------------------------------------------- Study data:   Study status:  Routine.  Consent:  The risks, benefits, and alternatives to the procedure were explained to the patient and informed consent was obtained.  Procedure:  The patient reported no pain pre or post test. Initial setup. The patient was brought to the laboratory. Surface ECG leads were monitored. Sedation. Conscious sedation was administered by cardiology staff. Transesophageal echocardiography. An adult multiplane transesophageal probe was inserted by the attending cardiologistwithout difficulty. Image quality was adequate.  Study completion:  The patient tolerated the procedure well. There were no complications.   Administered medications:   Fentanyl, , IV. Midazolam, 4mg , IV.          Diagnostic  transesophageal echocardiography.  2D and color Doppler.  Birthdate:  Patient birthdate: 31-Oct-1950.  Age:  Patient is 72 yr old.  Sex:  Gender: male.    BMI: 36.9 kg/m^2.  Blood pressure:     144/68  Patient status:  Inpatient.  Study date:  Study date: 09/07/2018. Study time: 09:16 AM.  Location:  Endoscopy.  -------------------------------------------------------------------  ------------------------------------------------------------------- Left ventricle:  Systolic function was normal. The estimated ejection fraction was in the range of 55% to 60%.  ------------------------------------------------------------------- Aortic valve:   Mildly thickened leaflets.  No evidence of vegetation.  Doppler:  There was mild regurgitation.  ------------------------------------------------------------------- Aorta:  The aorta was normal, not dilated, and non-diseased.  ------------------------------------------------------------------- Mitral valve:   The valve appears to be grossly normal.    No evidence of vegetation.  Doppler:  There was mild to moderate regurgitation.  ------------------------------------------------------------------- Left atrium:   No evidence of thrombus in the atrial cavity or appendage.  ------------------------------------------------------------------- Pulmonic valve:    No evidence of vegetation.  ------------------------------------------------------------------- Tricuspid valve:   The valve appears to be grossly normal.    No evidence of vegetation.  Doppler:  There was moderate regurgitation.  ------------------------------------------------------------------- Right atrium:  The atrium was normal in size.  No evidence of thrombus in the atrial cavity or appendage.  ------------------------------------------------------------------- Post procedure  conclusions Ascending Aorta:  - The aorta was normal, not dilated, and non-diseased.  ------------------------------------------------------------------- Prepared and Electronically Authenticated by  Kristeen Miss, M.D. 2020-01-07T17:37:17            Risk Assessment/Calculations:     CHA2DS2-VASc Score = 7   This indicates a 11.2% annual risk of stroke. The patient's score is based upon: CHF History: 0 HTN History: 1 Diabetes History: 1 Stroke History: 2 Vascular Disease History: 1 Age Score: 2 Gender Score: 0    Physical Exam:   VS:  BP (!) 180/90 (BP Location: Left Arm, Patient Position: Sitting, Cuff Size: Normal)   Pulse 84   Ht 5\' 7"  (1.702 m)   Wt 190 lb (86.2 kg) Comment: Verbal, pt in wheelchair  SpO2 94%   BMI 29.76 kg/m    Wt Readings from Last 3 Encounters:  10/12/23 190 lb (86.2 kg)  07/08/23 189 lb 9.6 oz (86 kg)  07/24/21 212 lb 6.4 oz (96.3 kg)    GEN: Well nourished, well developed in no acute distress NECK: No JVD; No carotid bruits CARDIAC: Irregularly irregular, no murmurs, rubs, gallops RESPIRATORY:  Clear to auscultation without rales, wheezing or rhonchi  ABDOMEN: Soft, non-tender, non-distended EXTREMITIES: +1 pedal edema mid tibia; No deformity   ASSESSMENT AND PLAN: .   Permanent atrial fibrillation/hypercoagulable state-CHA2DS2-VASc score of 7, his rate is controlled.  Continue Eliquis 2.5 mg twice daily--he is on reduced dose, unclear who reduced his dose or why--likely secondary to high fall risk however when he was last evaluated in our office he was on reduced dose Xarelto.  Will repeat BMET, CBC.  Hypertension-blood pressure in the office is very elevated today however he was very orthostatic on his last office visit with [place], it appears he is only on spironolactone 100 mg daily.  Recurrent strokes-currently on Eliquis.  COPD-on O2 continuously, not clearly follows with.  Preoperative evaluation-upcoming laparoscopic colon resection  secondary to colon cancer.  Unfortunately he is not able to meet greater than 4 METS of physical activity.  We will need to arrange an echocardiogram and an ischemic evaluation before we can clear him for surgery.    Informed Consent  Shared Decision Making/Informed Consent The risks [chest pain, shortness of breath, cardiac arrhythmias, dizziness, blood pressure fluctuations, myocardial infarction, stroke/transient ischemic attack, nausea, vomiting, allergic reaction, radiation exposure, metallic taste sensation and life-threatening complications (estimated to be 1 in 10,000)], benefits (risk stratification, diagnosing coronary artery disease, treatment guidance) and alternatives of a nuclear stress test were discussed in detail with Mr. Fenlon and he agrees to proceed.  Addendum -- echo revealed newly decreased EF, lexiscan was inconclusive. Discussed with his primary cardiologist, further GDMT is prohibited secondary to kidney dysfunction. He is as optimized as he can be from a cardiac perspective, he was asymptomatic when he was evaluated, but also mostly wheelchair bound. He can proceed with necessary surgery at an acceptable risk but would recommend he is monitored closely in the post-operative setting as well. Regarding his Eliquis, hold for four days and resume 48 hours after surgery.      Dispo: CBC, BMET, Lexiscan, echocardiogram, follow-up in 6 months.  Signed, Flossie Dibble, NP

## 2023-10-12 ENCOUNTER — Telehealth: Payer: Self-pay | Admitting: Cardiology

## 2023-10-12 ENCOUNTER — Ambulatory Visit: Payer: No Typology Code available for payment source | Attending: Cardiology | Admitting: Cardiology

## 2023-10-12 ENCOUNTER — Encounter: Payer: Self-pay | Admitting: Cardiology

## 2023-10-12 VITALS — BP 180/90 | HR 84 | Ht 67.0 in | Wt 190.0 lb

## 2023-10-12 DIAGNOSIS — I739 Peripheral vascular disease, unspecified: Secondary | ICD-10-CM | POA: Diagnosis not present

## 2023-10-12 DIAGNOSIS — Z01810 Encounter for preprocedural cardiovascular examination: Secondary | ICD-10-CM | POA: Diagnosis not present

## 2023-10-12 DIAGNOSIS — D6859 Other primary thrombophilia: Secondary | ICD-10-CM | POA: Diagnosis not present

## 2023-10-12 DIAGNOSIS — I4821 Permanent atrial fibrillation: Secondary | ICD-10-CM | POA: Diagnosis not present

## 2023-10-12 DIAGNOSIS — Z01818 Encounter for other preprocedural examination: Secondary | ICD-10-CM

## 2023-10-12 DIAGNOSIS — J410 Simple chronic bronchitis: Secondary | ICD-10-CM

## 2023-10-12 NOTE — Patient Instructions (Signed)
 Medication Instructions:  Your physician recommends that you continue on your current medications as directed. Please refer to the Current Medication list given to you today.  *If you need a refill on your cardiac medications before your next appointment, please call your pharmacy*   Lab Work: Your physician recommends that you return for lab work in: Today for BMP and CBC  If you have labs (blood work) drawn today and your tests are completely normal, you will receive your results only by: MyChart Message (if you have MyChart) OR A paper copy in the mail If you have any lab test that is abnormal or we need to change your treatment, we will call you to review the results.   Testing/Procedures: NONE   Follow-Up: At [facility], you and your health needs are our priority.  As part of our continuing mission to provide you with exceptional heart care, we have created designated Provider Care Teams.  These Care Teams include your primary Cardiologist (physician) and Advanced Practice Providers (APPs -  Physician Assistants and Nurse Practitioners) who all work together to provide you with the care you need, when you need it.  We recommend signing up for the patient portal called "MyChart".  Sign up information is provided on this After Visit Summary.  MyChart is used to connect with patients for Virtual Visits (Telemedicine).  Patients are able to view lab/test results, encounter notes, upcoming appointments, etc.  Non-urgent messages can be sent to your provider as well.   To learn more about what you can do with MyChart, go to [email or web address].    Your next appointment:   6 month(s)  Provider:   Zoe Hinds, MD    Other Instructions

## 2023-10-12 NOTE — Telephone Encounter (Signed)
 Duplicate request

## 2023-10-13 LAB — CBC WITH DIFFERENTIAL/PLATELET
Basophils Absolute: 0 x10E3/uL (ref 0.0–0.2)
Basos: 0 %
EOS (ABSOLUTE): 0.1 x10E3/uL (ref 0.0–0.4)
Eos: 1 %
Hematocrit: 40.1 % (ref 37.5–51.0)
Hemoglobin: 12.6 g/dL — ABNORMAL LOW (ref 13.0–17.7)
Immature Grans (Abs): 0 x10E3/uL (ref 0.0–0.1)
Immature Granulocytes: 0 %
Lymphocytes Absolute: 0.7 x10E3/uL (ref 0.7–3.1)
Lymphs: 8 %
MCH: 27.2 pg (ref 26.6–33.0)
MCHC: 31.4 g/dL — ABNORMAL LOW (ref 31.5–35.7)
MCV: 87 fL (ref 79–97)
Monocytes Absolute: 0.5 x10E3/uL (ref 0.1–0.9)
Monocytes: 5 %
Neutrophils Absolute: 7.4 x10E3/uL — ABNORMAL HIGH (ref 1.4–7.0)
Neutrophils: 86 %
Platelets: 297 x10E3/uL (ref 150–450)
RBC: 4.63 x10E6/uL (ref 4.14–5.80)
RDW: 13 % (ref 11.6–15.4)
WBC: 8.7 x10E3/uL (ref 3.4–10.8)

## 2023-10-13 LAB — BASIC METABOLIC PANEL WITH GFR
BUN/Creatinine Ratio: 12 (ref 10–24)
BUN: 18 mg/dL (ref 8–27)
CO2: 28 mmol/L (ref 20–29)
Calcium: 8.5 mg/dL — ABNORMAL LOW (ref 8.6–10.2)
Chloride: 99 mmol/L (ref 96–106)
Creatinine, Ser: 1.53 mg/dL — ABNORMAL HIGH (ref 0.76–1.27)
Glucose: 107 mg/dL — ABNORMAL HIGH (ref 70–99)
Potassium: 3.4 mmol/L — ABNORMAL LOW (ref 3.5–5.2)
Sodium: 143 mmol/L (ref 134–144)
eGFR: 47 mL/min/1.73 — ABNORMAL LOW

## 2023-10-13 NOTE — Telephone Encounter (Signed)
Spoke with Kendal Hymen per DPR. Reviewed lab results as per [facility] note.  Reviewed that patient needs a lexi scan and echo. Kendal Hymen was agreeable. Orders placed.

## 2023-10-14 NOTE — Telephone Encounter (Signed)
Patient with diagnosis of PAF on Eliquis for anticoagulation.    Procedure:  Laparoscopic colon resection for colon cancer  Date of procedure: 11/23/23   CHA2DS2-VASc Score = 7   This indicates a 11.2% annual risk of stroke. The patient's score is based upon: CHF History: 0 HTN History: 1 Diabetes History: 1 Stroke History: 2 Vascular Disease History: 1 Age Score: 2 Gender Score: 0    CrCl 41 mL/min Platelet count 297 K  typically hold Eliquis for 3 days prior to this types of procedures however pt is at elevated risk off of anticoagulation based on history of afib and recurrent CVA. Will forward to MD to confirm 3 day hold is acceptable  **This guidance is not considered finalized until pre-operative APP has relayed final recommendations.**

## 2023-10-16 ENCOUNTER — Ambulatory Visit: Payer: No Typology Code available for payment source | Admitting: Cardiology

## 2023-10-26 DIAGNOSIS — I517 Cardiomegaly: Secondary | ICD-10-CM

## 2023-11-02 DIAGNOSIS — R079 Chest pain, unspecified: Secondary | ICD-10-CM | POA: Diagnosis not present

## 2023-11-03 NOTE — Telephone Encounter (Signed)
 CORRECT FAX # [phone].   Surgeon office called today about  preop clearance. Reviewed notes from Wallis Bamberg, NP ov notes when she saw the pt on 10/12/23. Notes states NP ordered stress test and echo. I stated to the surgeon's office that I will reach out to Wallis Bamberg, NP who saw the pt about testing.   I d/w NP. In our conversation she discovered:  well he apparently was either admitted at [facility] or had the testing there. Either way I was not aware the tests were completed! I will review and send clearance to their office tomorrow. Thanks again!   I will update the surgeon office to watch for notes from Wallis Bamberg, NP.

## 2023-11-03 NOTE — Telephone Encounter (Signed)
 November 03, 2023 Me     11/03/23  4:58 PM Note CORRECT FAX # [phone].    Surgeon office called today about  preop clearance. Reviewed notes from Wallis Bamberg, NP ov notes when she saw the pt on 10/12/23. Notes states NP ordered stress test and echo. I stated to the surgeon's office that I will reach out to Wallis Bamberg, NP who saw the pt about testing.    I d/w NP. In our conversation she discovered:  well he apparently was either admitted at [facility] or had the testing there. Either way I was not aware the tests were completed! I will review and send clearance to their office tomorrow. Thanks again!     I will update the surgeon office to watch for notes from Wallis Bamberg, NP.      October 13, 2023  Lonia Farber, RN     10/13/23  8:41 AM Note Spoke with Kendal Hymen per DPR. Reviewed lab results as per [facility] note.  Reviewed that patient needs a lexi scan and echo. Kendal Hymen was agreeable. Orders placed.            10/13/23  8:41 AM Smoak, Barkley Boards, RN routed this conversation to Flossie Dibble, NP        10/13/23  8:38 AM Harless Nakayama, Barkley Boards, RN contacted [facility] (Emergency Contact)   October 12, 2023  Flossie Dibble, NP to Lonia Farber, RN      10/12/23  2:42 PM Please call his Althea Charon, he will need to have a stress evaluation and echocardiogram arranged before he can undergo surgery. We will need to arrange this too, I needed to speak with Dr. Dulce Sellar before we made any decisions.

## 2023-11-10 NOTE — Telephone Encounter (Signed)
 Received another call from [facility] @ Dr. Baker Pierini office, inquiring about surgical clearance for procedure on 11/23/23.  Will fwd to Wallis Bamberg, NP, who is the one to comment on clearance.  Please call her [phone] or fax over a update.

## 2024-01-14 ENCOUNTER — Ambulatory Visit: Payer: Self-pay | Admitting: Family Medicine

## 2024-03-08 ENCOUNTER — Encounter (HOSPITAL_COMMUNITY): Payer: Self-pay

## 2024-03-09 DIAGNOSIS — E8770 Fluid overload, unspecified: Secondary | ICD-10-CM | POA: Insufficient documentation

## 2024-03-23 ENCOUNTER — Encounter: Admitting: Physician Assistant

## 2024-04-24 NOTE — Progress Notes (Unsigned)
 Cardiology Office Note:    Date:  04/29/2024   ID:  Jon [name], DOB July 13, 1951, MRN [id number]  PCP:  Patient, No Pcp Per  Cardiologist:  Redell Leiter, MD    Referring MD: No ref. provider found    ASSESSMENT:    1. Coronary artery disease of native artery of native heart with stable angina pectoris (HCC)   2. Permanent atrial fibrillation (HCC)   3. Chronic anticoagulation   4. 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 CKD (chronic kidney disease) stage 4, GFR 15-29 ml/min (HCC)   6. Mixed hyperlipidemia   7. Chronic obstructive pulmonary disease, unspecified COPD type (HCC)    PLAN:    In order of problems listed above:  Fortunately had recent coronary angiography has no obstructive CAD and his symptoms are nonanginal in nature musculoskeletal perhaps related to fall and trauma Further testing x-ray and treatment I will leave to his primary care physicians at stay well Atrial fibrillation is stable rate controlled currently not on rate suppressing medication and continue his current anticoagulant Heart failure looks compensated he is currently not on a loop diuretic may require 1 depending on peripheral edema.  Being very careful with his stage IV CKD and he does take spironolactone he is also taking an SGLT2 inhibitor recent creatinine 2.4 calcium  is good at 3.9 he will need to have a BMP done at least every month with a GFR of 26 cc/min Stable COPD   Next appointment: 9 months   Medication Adjustments/Labs and Tests Ordered: Current medicines are reviewed at length with the patient today.  Concerns regarding medicines are outlined above.  Orders Placed This Encounter  Procedures   EKG 12-Lead   No orders of the defined types were placed in this encounter.    History of Present Illness:    Jon [name] is a 73 y.o. male with a hx of permanent atrial fibrillation recurrent strokes hypertension mild nonobstructive CAD  on remote coronary angiography in 2016 COPD obstructive sleep apnea diabetes stage IV CKD and peripheral vascular disease.  He was last seen Delon Hoover, NP for 10/12/23 previously seen by me more than 3 years ago.  For further evaluation and echocardiogram 10/26/2023 showing mild to moderate LVH ejection fraction reduced 35 to 40% and no significant valvular abnormality.  Left atrium was severely dilated right atrium mildly dilated.  Has had a myocardial perfusion study performed but results are not in epic.  He subsequently had cardiac cath performed at [facility] 0 03/09/2024 described as showing mild CAD and no flow-limiting stenoses.  He was felt to have decompensated heart failure and was given additional diuretic despite his stage IV CKD.  Recent labs [facility] 04/08/2024 showed mild anemia hemoglobin 11.3 platelets 259,000 sodium 134 creatinine 2.40 GFR 27 cc/min potassium 3.6 lipid profile with a cholesterol 113 LDL 58 non-HDL cholesterol 72  Compliance with diet, lifestyle and medications: Yes  Is a difficult visit he tells me he has had multiple falls he struck his chest and complains of constant pain centered about his sternum.  He has marked tenderness on palpation of his costochondral junction bilaterally.  Symptoms are not exertional not anginal not relieved with rest.  He is on oxygen  but is not short of breath and he does not have any peripheral edema. 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   OLBx 01/20/14 BOOP Started steroids 02/28/14    BPH (benign prostatic hyperplasia) 06/16/2021   CHF (congestive heart failure) (HCC)    Chronic anticoagulation-Xarelto  02/09/2014   Chronic atrial fibrillation (HCC) 01/03/2014   Chronic bronchitis (HCC) 12/21/2013   Chronic diastolic heart failure (HCC) 02/22/2015   Chronic pain of both knees 03/18/2017   Overview:  Added automatically from request for surgery [id number]   Chronic respiratory failure (HCC) 04/10/2015   Chronic respiratory failure with hypoxia (HCC) 02/11/2020   Chronic venous hypertension with ulcer (HCC) 06/16/2021   Class 2 severe obesity due to excess calories with serious comorbidity and body mass index (BMI) of 38.0 to 38.9 in adult (HCC) 12/30/2019   Class 3 severe obesity in adult 12/30/2019   Closed fracture of multiple carpal bones 03/07/2020   COPD (chronic obstructive pulmonary disease) (HCC) 01/25/2015   Cough    coughing up blood- started last nite, seems to be worse now   CVA (cerebral vascular accident) (HCC) 02/09/2014   Diabetic polyneuropathy associated with type 2 diabetes mellitus (HCC) 12/21/2013   Diabetic ulcer of toe of right foot associated with type 2 diabetes mellitus, with necrosis of bone (HCC)    Diarrhea 06/30/2021   DM (diabetes mellitus), 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/01/2018   DOE (dyspnea on exertion) 06/30/2021   Drug induced constipation 11/10/2017   Dysrhythmia    atrial fib takes cardizem ,    Fibromyalgia    Frequent falls 06/01/2021   GERD (gastroesophageal reflux disease)    Gout 06/30/2021   H/O hiatal hernia    Hemoptysis 08/01/2015   Herpes zoster 06/26/2015   History of amputation of lower extremity associated with diabetes mellitus (HCC) 06/01/2021   History of stroke    Hyperkalemia-repeat pending 11/29/2014   Hyperlipemia    Hyperlipidemia     Hypertensive heart disease with heart failure (HCC)    Hypokalemia 06/01/2021   Hypomagnesemia 06/16/2021   Hypothyroidism    LBBB (left bundle branch block) 02/22/2015   Left-sided weakness 06/16/2021   Long term (current) use of anticoagulants 03/21/2014   Mild CAD 02/20/2015   Overview:  On recent cardiac cath  Overview:  On recent cardiac cath   Moderate recurrent major depression (HCC) 02/11/2020   Morbid obesity with BMI of 40.0-44.9, adult (HCC) 02/11/2020   Muscle weakness 06/16/2021   Myocardial infarction [facility])    one dr says yes; another says no   Myocardial infarction [facility])    one dr says yes; another says no   Neuromuscular disorder (HCC)    rls, neuropathy   Neuropathy    rls, neuropathy    NSVT (nonsustained ventricular tachycardia) (HCC) 06/16/2021   Obesity (BMI 30-39.9)    Obstructive sleep apnea 01/25/2015   On home oxygen  therapy    2L w/CPAP at bedtime (09/01/2018)   OSA on CPAP    cpap & oxygen    Pericardial effusion    Peripheral vascular disease (HCC)    Peritoneal effusion, chronic 02/22/2015  Overview:  With surgical window   Physical deconditioning 06/01/2021   PNA (pneumonia) 04/10/2015   Pneumonia 12/2013   several times (01/18/2014)   Precordial pain 01/26/2015   Preoperative cardiovascular examination 02/24/2016   Primary osteoarthritis of both knees 03/18/2017   Overview:  Added automatically from request for surgery [id number]   PVD (peripheral vascular disease) (HCC) 09/28/2018   Restless leg syndrome    Secondary hyperparathyroidism of renal origin (HCC) 12/04/2020   Sepsis (HCC) 06/16/2021   Sleep apnea    cpap & oxygen     Stage 3b chronic kidney disease (HCC) 06/01/2021   Stroke (HCC) 2012   denies residual on 01/18/2014   Troponin level elevated 11/29/2014   Type 2 diabetes mellitus with diabetic neuropathy, with long-term current use of insulin  (HCC) 09/01/2018   Ulcers of both lower legs (HCC) 12/17/2019   Uncomplicated  opioid dependence (HCC) 12/17/2019   Vitamin D  deficiency 10/11/2019   Volume overload 03/09/2024   Wound of buttock 06/16/2021    Current Medications: Current Meds  Medication Sig   acetaminophen  (TYLENOL ) 500 MG tablet Take 1,000 mg by mouth every 6 (six) hours as needed for mild pain (pain score 1-3) or moderate pain (pain score 4-6).   albuterol  (VENTOLIN  HFA) 108 (90 Base) MCG/ACT inhaler Inhale 2 puffs into the lungs every 6 (six) hours as needed for wheezing or shortness of breath.   ascorbic acid (VITAMIN C) 500 MG tablet Take 500 mg by mouth daily.   bisacodyl  (DULCOLAX) 5 MG EC tablet Take 5 mg by mouth daily as needed for moderate constipation.   bumetanide (BUMEX) 2 MG tablet Take 2 mg by mouth 2 (two) times daily.   cetirizine (ZYRTEC) 5 MG tablet Take 5 mg by mouth in the morning and at bedtime.   dapagliflozin propanediol (FARXIGA) 5 MG TABS tablet Take 5 mg by mouth daily.   ELIQUIS 5 MG TABS tablet Take 5 mg by mouth 2 (two) times daily.   Ferrous Gluconate 239 (27 Fe) MG TABS Take 1 tablet by mouth daily with breakfast.   hydrALAZINE  (APRESOLINE ) 100 MG tablet Take 100 mg by mouth 3 (three) times daily.   insulin  glargine-yfgn (SEMGLEE ) 100 UNIT/ML Pen Inject into the skin 2 (two) times daily.   ipratropium-albuterol  (DUONEB) 0.5-2.5 (3) MG/3ML SOLN Take 3 mLs by nebulization every 6 (six) hours as needed (shortness of breath).   isosorbide  dinitrate (ISORDIL ) 20 MG tablet Take 20 mg by mouth 3 (three) times daily.   LANTUS  SOLOSTAR 100 UNIT/ML Solostar Pen Inject into the skin daily.   latanoprost  (XALATAN ) 0.005 % ophthalmic solution Place 1 drop into both eyes at bedtime.   magnesium  oxide (MAG-OX) 400 (240 Mg) MG tablet Take 400 mg by mouth daily.   nitroGLYCERIN  (NITROSTAT ) 0.4 MG SL tablet Place 0.4 mg under the tongue every 5 (five) minutes as needed for chest pain.    NYSTATIN powder Apply 1 Application topically 2 (two) times daily.   omeprazole (PRILOSEC) 20 MG  capsule Take 20 mg by mouth daily.   ondansetron  (ZOFRAN ) 4 MG tablet Take 4 mg by mouth every 8 (eight) hours as needed for nausea or vomiting.   OXYGEN  Inhale into the lungs. 3 L/min.   polyethylene glycol (MIRALAX  / GLYCOLAX ) 17 g packet Take 17 g by mouth daily.   Pseudoephedrine-Guaifenesin (MUCINEX D MAX STRENGTH) [phone] MG TB12 Take 1 tablet by mouth daily as needed (congestion).   sevelamer (RENAGEL) 800 MG tablet Take 800 mg by mouth 3 (three)  times daily.   simethicone (MYLICON) 125 MG chewable tablet Chew 125 mg by mouth every 6 (six) hours as needed for flatulence.   sodium bicarbonate 650 MG tablet Take 1,300 mg by mouth 2 (two) times daily.   spironolactone (ALDACTONE) 50 MG tablet Take 100 mg by mouth every 12 (twelve) hours.   tamsulosin  (FLOMAX ) 0.4 MG CAPS capsule TAKE 2 CAPSULES BY MOUTH DAILY AFTER SUPPER   TRELEGY ELLIPTA 100-62.5-25 MCG/ACT AEPB Inhale 1 puff into the lungs daily.   Vitamin D , Ergocalciferol , (DRISDOL ) 1.25 MG (50000 UNIT) CAPS capsule Take 1 capsule (50,000 Units total) by mouth once a week.   [DISCONTINUED] atorvastatin  (LIPITOR) 40 MG tablet TAKE 1 TABLET BY MOUTH DAILY   [DISCONTINUED] bumetanide (BUMEX) 1 MG tablet Take 1 mg by mouth 2 (two) times daily.   [DISCONTINUED] insulin  glargine (LANTUS ) 100 UNIT/ML injection Inject 32 Units into the skin daily.      EKGs/Labs/Other Studies Reviewed:    The following studies were reviewed today:  Cardiac Studies & Procedures   ______________________________________________________________________________________________   STRESS TESTS  MYOCARDIAL PERFUSION IMAGING 10/30/2023   ECHOCARDIOGRAM  ECHOCARDIOGRAM COMPLETE 09/06/2020  Narrative ECHOCARDIOGRAM REPORT    Patient Name:   DECLIN RAJAN Date of Exam: 09/06/2020 Medical Rec #:  [id number]       Height:       67.0 in Accession #:    [id number]      Weight:       253.0 lb Date of Birth:  [birth year]       BSA:          2.235 m Patient Age:     73 years        BP:           130/68 mmHg Patient Gender: M               HR:           82 bpm. Exam Location:  [place]  Procedure: 2D Echo  Indications:    Hypertensive heart disease with heart failure (HCC) [I11.0 (ICD-10-CM)]  History:        Patient has prior history of Echocardiogram examinations, most recent 01/31/2014. CHF, CAD and Previous Myocardial Infarction, Stroke and PAD, Arrythmias:LBBB and Atrial Fibrillation; Risk Factors:Diabetes and Dyslipidemia.  Sonographer:    Lynwood Silvas Referring Phys: [phone] REDELL JINNY LEITER   Sonographer Comments: Suboptimal apical window. IMPRESSIONS   1. Left ventricular ejection fraction, by estimation, is 55 to 60%. The left ventricle has normal function. The left ventricle has no regional wall motion abnormalities. There is severe concentric left ventricular hypertrophy. Left ventricular diastolic parameters are indeterminate. 2. Right ventricular systolic function is normal. The right ventricular size is mildly enlarged. There is normal pulmonary artery systolic pressure. 3. The mitral valve is normal in structure. No evidence of mitral valve regurgitation. No evidence of mitral stenosis. 4. The aortic valve is normal in structure. Aortic valve regurgitation is not visualized. Mild aortic valve sclerosis is present, with no evidence of aortic valve stenosis. 5. The inferior vena cava is normal in size with greater than 50% respiratory variability, suggesting right atrial pressure of 3 mmHg.  FINDINGS Left Ventricle: Left ventricular ejection fraction, by estimation, is 55 to 60%. The left ventricle has normal function. The left ventricle has no regional wall motion abnormalities. Definity  contrast agent was given IV to delineate the left ventricular endocardial borders. The left ventricular internal cavity size was normal in size. There is severe concentric  left ventricular hypertrophy. Left ventricular diastolic parameters are  indeterminate.  Right Ventricle: Prominent moderator band noted. The right ventricular size is mildly enlarged. No increase in right ventricular wall thickness. Right ventricular systolic function is normal. There is normal pulmonary artery systolic pressure. The tricuspid regurgitant velocity is 2.74 m/s, and with an assumed right atrial pressure of 3 mmHg, the estimated right ventricular systolic pressure is 33.0 mmHg.  Left Atrium: Left atrial size was normal in size.  Right Atrium: Right atrial size was normal in size.  Pericardium: There is no evidence of pericardial effusion. Presence of pericardial fat pad.  Mitral Valve: The mitral valve is normal in structure. Mild mitral annular calcification. No evidence of mitral valve regurgitation. No evidence of mitral valve stenosis.  Tricuspid Valve: The tricuspid valve is normal in structure. Tricuspid valve regurgitation is trivial. No evidence of tricuspid stenosis.  Aortic Valve: The aortic valve is normal in structure. There is mild aortic valve annular calcification. Aortic valve regurgitation is not visualized. Mild aortic valve sclerosis is present, with no evidence of aortic valve stenosis.  Pulmonic Valve: The pulmonic valve was normal in structure. Pulmonic valve regurgitation is not visualized. No evidence of pulmonic stenosis.  Aorta: The aortic root is normal in size and structure.  Venous: The inferior vena cava is normal in size with greater than 50% respiratory variability, suggesting right atrial pressure of 3 mmHg.  IAS/Shunts: No atrial level shunt detected by color flow Doppler.   LEFT VENTRICLE PLAX 2D LVIDd:         3.90 cm  Diastology LVIDs:         2.45 cm  LV e' medial:    5.08 cm/s LV PW:         1.70 cm  LV E/e' medial:  24.2 LV IVS:        1.70 cm  LV e' lateral:   6.53 cm/s LVOT diam:     2.00 cm  LV E/e' lateral: 18.8 LV SV:         64 LV SV Index:   29 LVOT Area:     3.14 cm   RIGHT VENTRICLE             IVC RV S prime:     5.57 cm/s  IVC diam: 1.50 cm TAPSE (M-mode): 1.8 cm  LEFT ATRIUM             Index       RIGHT ATRIUM           Index LA diam:        4.80 cm 2.15 cm/m  RA Area:     20.80 cm LA Vol (A2C):   51.3 ml 22.96 ml/m RA Volume:   54.70 ml  24.48 ml/m LA Vol (A4C):   75.2 ml 33.65 ml/m LA Biplane Vol: 62.4 ml 27.92 ml/m AORTIC VALVE LVOT Vmax:   90.40 cm/s LVOT Vmean:  66.000 cm/s LVOT VTI:    0.204 m  AORTA Ao Root diam: 3.70 cm Ao Asc diam:  3.60 cm  MITRAL VALVE                TRICUSPID VALVE MV Area (PHT): 5.27 cm     TR Peak grad:   30.0 mmHg MV Decel Time: 144 msec     TR Vmax:        274.00 cm/s MV E velocity: 123.00 cm/s SHUNTS Systemic VTI:  0.20 m Systemic Diam: 2.00 cm  Kardie Tobb DO  Electronically signed by Dub Huntsman DO Signature Date/Time: 09/06/2020/1:06:52 PM    Final   TEE  ECHO TEE 09/07/2018  Narrative *[place]* *[facility]* [address] [place], [place] [address] [phone]  ------------------------------------------------------------------- Transesophageal Echocardiography  Patient:    Yonis, Carreon MR #:       [id number] Study Date: 09/07/2018 Gender:     M Age:        80 Height:     170.2 cm Weight:     106.8 kg BSA:        2.29 m^2 Pt. Status: Room:       5W31C  PERFORMING   Aleene Passe, M.D. ORDERING     Kilroy, Luke K REFERRING    Maxie Primes K SONOGRAPHER  Kurt Jointer, RDCS ATTENDING    Elgergawy, Brayton GORMAN BERNIS Christobal, Ramesh  cc:  ------------------------------------------------------------------- LV EF: 55% -   60%  ------------------------------------------------------------------- Indications:      Bacteremia 790.7.  ------------------------------------------------------------------- Study Conclusions  - Left ventricle: Systolic function was normal. The estimated ejection fraction was in the range of 55% to 60%. - Aortic valve: No evidence of  vegetation. There was mild regurgitation. - Mitral valve: No evidence of vegetation. There was mild to moderate regurgitation. - Left atrium: No evidence of thrombus in the atrial cavity or appendage. - Right atrium: No evidence of thrombus in the atrial cavity or appendage. - Tricuspid valve: No evidence of vegetation. There was moderate regurgitation. - Pulmonic valve: No evidence of vegetation.  ------------------------------------------------------------------- Study data:   Study status:  Routine.  Consent:  The risks, benefits, and alternatives to the procedure were explained to the patient and informed consent was obtained.  Procedure:  The patient reported no pain pre or post test. Initial setup. The patient was brought to the laboratory. Surface ECG leads were monitored. Sedation. Conscious sedation was administered by cardiology staff. Transesophageal echocardiography. An adult multiplane transesophageal probe was inserted by the attending cardiologistwithout difficulty. Image quality was adequate.  Study completion:  The patient tolerated the procedure well. There were no complications.  Administered medications:   Fentanyl , 50mcg, IV. Midazolam , 4mg , IV.          Diagnostic transesophageal echocardiography.  2D and color Doppler.  Birthdate:  Patient birthdate: 08/21/1951.  Age:  Patient is 73 yr old.  Sex:  Gender: male.    BMI: 36.9 kg/m^2.  Blood pressure:     144/68  Patient status:  Inpatient.  Study date:  Study date: 09/07/2018. Study time: 09:16 AM.  Location:  Endoscopy.  -------------------------------------------------------------------  ------------------------------------------------------------------- Left ventricle:  Systolic function was normal. The estimated ejection fraction was in the range of 55% to 60%.  ------------------------------------------------------------------- Aortic valve:   Mildly thickened leaflets.  No evidence of vegetation.   Doppler:  There was mild regurgitation.  ------------------------------------------------------------------- Aorta:  The aorta was normal, not dilated, and non-diseased.  ------------------------------------------------------------------- Mitral valve:   The valve appears to be grossly normal.    No evidence of vegetation.  Doppler:  There was mild to moderate regurgitation.  ------------------------------------------------------------------- Left atrium:   No evidence of thrombus in the atrial cavity or appendage.  ------------------------------------------------------------------- Pulmonic valve:    No evidence of vegetation.  ------------------------------------------------------------------- Tricuspid valve:   The valve appears to be grossly normal.    No evidence of vegetation.  Doppler:  There was moderate regurgitation.  ------------------------------------------------------------------- Right atrium:  The atrium was normal in size.  No evidence of thrombus in the  atrial cavity or appendage.  ------------------------------------------------------------------- Post procedure conclusions Ascending Aorta:  - The aorta was normal, not dilated, and non-diseased.  ------------------------------------------------------------------- Prepared and Electronically Authenticated by  Aleene Passe, M.D. 2020-01-07T17:37:17        ______________________________________________________________________________________________          Recent Labs: 10/12/2023: BUN 18; Creatinine, Ser 1.53; Hemoglobin 12.6; Platelets 297; Potassium 3.4; Sodium 143  Recent Lipid Panel    Component Value Date/Time   CHOL 209 (H) 05/31/2021 1043   TRIG 99 05/31/2021 1043   HDL 60 05/31/2021 1043   CHOLHDL 3.5 05/31/2021 1043   CHOLHDL 2.9 01/31/2014 0337   VLDL 14 01/31/2014 0337   LDLCALC 131 (H) 05/31/2021 1043    Physical Exam:    VS:  BP 130/72   Pulse 63   Ht 5' 7 (1.702 m)    SpO2 97%   BMI 29.76 kg/m     Wt Readings from Last 3 Encounters:  10/12/23 190 lb (86.2 kg)  07/08/23 189 lb 9.6 oz (86 kg)  07/24/21 212 lb 6.4 oz (96.3 kg)     GEN: Somewhat anxious perhaps agitated well nourished, well developed in no acute distress HEENT: Normal NECK: No JVD; No carotid bruits LYMPHATICS: No lymphadenopathy CARDIAC: Marked tenderness at the costochondral junctions right and left side that reproduces much of his complaint RRR, no murmurs, rubs, gallops RESPIRATORY:  Clear to auscultation without rales, wheezing or rhonchi  ABDOMEN: Soft, non-tender, non-distended MUSCULOSKELETAL:  No edema; No deformity he has dressings over his distal lower extremity bilaterally SKIN: Warm and dry NEUROLOGIC:  Alert and oriented x 3 PSYCHIATRIC:  Normal affect    Signed, Redell Leiter, MD  04/29/2024 11:45 AM    [facility]

## 2024-04-28 DIAGNOSIS — E11621 Type 2 diabetes mellitus with foot ulcer: Secondary | ICD-10-CM | POA: Insufficient documentation

## 2024-04-29 ENCOUNTER — Encounter: Payer: Self-pay | Admitting: Cardiology

## 2024-04-29 ENCOUNTER — Ambulatory Visit: Attending: Cardiology | Admitting: Cardiology

## 2024-04-29 VITALS — BP 130/72 | HR 63 | Ht 67.0 in

## 2024-04-29 DIAGNOSIS — I110 Hypertensive heart disease with heart failure: Secondary | ICD-10-CM | POA: Diagnosis present

## 2024-04-29 DIAGNOSIS — Z7901 Long term (current) use of anticoagulants: Secondary | ICD-10-CM | POA: Insufficient documentation

## 2024-04-29 DIAGNOSIS — I4821 Permanent atrial fibrillation: Secondary | ICD-10-CM | POA: Diagnosis present

## 2024-04-29 DIAGNOSIS — I5022 Chronic systolic (congestive) heart failure: Secondary | ICD-10-CM | POA: Diagnosis present

## 2024-04-29 DIAGNOSIS — N184 Chronic kidney disease, stage 4 (severe): Secondary | ICD-10-CM | POA: Insufficient documentation

## 2024-04-29 DIAGNOSIS — J449 Chronic obstructive pulmonary disease, unspecified: Secondary | ICD-10-CM | POA: Insufficient documentation

## 2024-04-29 DIAGNOSIS — I25118 Atherosclerotic heart disease of native coronary artery with other forms of angina pectoris: Secondary | ICD-10-CM | POA: Diagnosis present

## 2024-04-29 DIAGNOSIS — E782 Mixed hyperlipidemia: Secondary | ICD-10-CM | POA: Insufficient documentation

## 2024-04-29 NOTE — Patient Instructions (Signed)
 Medication Instructions:  Your physician recommends that you continue on your current medications as directed. Please refer to the Current Medication list given to you today.  *If you need a refill on your cardiac medications before your next appointment, please call your pharmacy*  Lab Work: None If you have labs (blood work) drawn today and your tests are completely normal, you will receive your results only by: MyChart Message (if you have MyChart) OR A paper copy in the mail If you have any lab test that is abnormal or we need to change your treatment, we will call you to review the results.  Testing/Procedures: None  Follow-Up: At [facility], you and your health needs are our priority.  As part of our continuing mission to provide you with exceptional heart care, our providers are all part of one team.  This team includes your primary Cardiologist (physician) and Advanced Practice Providers or APPs (Physician Assistants and Nurse Practitioners) who all work together to provide you with the care you need, when you need it.  Your next appointment:   9 month(s)  Provider:   Zoe Hinds, MD    We recommend signing up for the patient portal called "MyChart".  Sign up information is provided on this After Visit Summary.  MyChart is used to connect with patients for Virtual Visits (Telemedicine).  Patients are able to view lab/test results, encounter notes, upcoming appointments, etc.  Non-urgent messages can be sent to your provider as well.   To learn more about what you can do with MyChart, go to [email or web address].   Other Instructions None

## 2024-09-24 LAB — LAB REPORT - SCANNED: EGFR: 27
